# Patient Record
Sex: Female | Born: 1935 | Race: White | Hispanic: No | Smoking: Never smoker
Health system: Southern US, Community
[De-identification: ages and names within clinical notes are randomized; demographics above are authoritative.]

## PROBLEM LIST (undated history)

## (undated) DIAGNOSIS — K625 Hemorrhage of anus and rectum: Secondary | ICD-10-CM

## (undated) DIAGNOSIS — D649 Anemia, unspecified: Secondary | ICD-10-CM

## (undated) DIAGNOSIS — M79669 Pain in unspecified lower leg: Secondary | ICD-10-CM

## (undated) DIAGNOSIS — Z7901 Long term (current) use of anticoagulants: Secondary | ICD-10-CM

## (undated) DIAGNOSIS — R112 Nausea with vomiting, unspecified: Secondary | ICD-10-CM

## (undated) DIAGNOSIS — K219 Gastro-esophageal reflux disease without esophagitis: Secondary | ICD-10-CM

## (undated) DIAGNOSIS — K5790 Diverticulosis of intestine, part unspecified, without perforation or abscess without bleeding: Secondary | ICD-10-CM

## (undated) DIAGNOSIS — R0989 Other specified symptoms and signs involving the circulatory and respiratory systems: Secondary | ICD-10-CM

## (undated) DIAGNOSIS — H269 Unspecified cataract: Secondary | ICD-10-CM

## (undated) DIAGNOSIS — I639 Cerebral infarction, unspecified: Secondary | ICD-10-CM

## (undated) DIAGNOSIS — R569 Unspecified convulsions: Secondary | ICD-10-CM

## (undated) DIAGNOSIS — I10 Essential (primary) hypertension: Secondary | ICD-10-CM

## (undated) DIAGNOSIS — Z8619 Personal history of other infectious and parasitic diseases: Secondary | ICD-10-CM

## (undated) DIAGNOSIS — M1990 Unspecified osteoarthritis, unspecified site: Secondary | ICD-10-CM

## (undated) DIAGNOSIS — I4891 Unspecified atrial fibrillation: Secondary | ICD-10-CM

## (undated) DIAGNOSIS — G4700 Insomnia, unspecified: Secondary | ICD-10-CM

## (undated) DIAGNOSIS — E785 Hyperlipidemia, unspecified: Secondary | ICD-10-CM

## (undated) DIAGNOSIS — Z9889 Other specified postprocedural states: Secondary | ICD-10-CM

## (undated) DIAGNOSIS — E039 Hypothyroidism, unspecified: Secondary | ICD-10-CM

## (undated) HISTORY — DX: Anemia, unspecified: D64.9

## (undated) HISTORY — PX: TOTAL KNEE ARTHROPLASTY: SHX125

## (undated) HISTORY — DX: Pain in unspecified lower leg: M79.669

## (undated) HISTORY — DX: Gastro-esophageal reflux disease without esophagitis: K21.9

## (undated) HISTORY — DX: Essential (primary) hypertension: I10

## (undated) HISTORY — DX: Cerebral infarction, unspecified: I63.9

## (undated) HISTORY — DX: Nausea with vomiting, unspecified: R11.2

## (undated) HISTORY — PX: SHOULDER OPEN ROTATOR CUFF REPAIR: SHX2407

## (undated) HISTORY — DX: Personal history of other infectious and parasitic diseases: Z86.19

## (undated) HISTORY — DX: Diverticulosis of intestine, part unspecified, without perforation or abscess without bleeding: K57.90

## (undated) HISTORY — DX: Unspecified atrial fibrillation: I48.91

## (undated) HISTORY — DX: Unspecified cataract: H26.9

## (undated) HISTORY — DX: Long term (current) use of anticoagulants: Z79.01

## (undated) HISTORY — DX: Hyperlipidemia, unspecified: E78.5

## (undated) HISTORY — DX: Other specified postprocedural states: Z98.890

## (undated) HISTORY — DX: Other specified symptoms and signs involving the circulatory and respiratory systems: R09.89

## (undated) HISTORY — PX: GALLBLADDER SURGERY: SHX652

## (undated) HISTORY — DX: Hypothyroidism, unspecified: E03.9

## (undated) HISTORY — DX: Unspecified convulsions: R56.9

## (undated) HISTORY — DX: Insomnia, unspecified: G47.00

## (undated) HISTORY — PX: COLONOSCOPY: SHX5424

## (undated) HISTORY — DX: Unspecified osteoarthritis, unspecified site: M19.90

---

## 1998-12-21 ENCOUNTER — Inpatient Hospital Stay (HOSPITAL_COMMUNITY): Admission: AD | Admit: 1998-12-21 | Discharge: 1998-12-21 | Payer: Self-pay | Admitting: Cardiology

## 1999-01-05 DIAGNOSIS — K625 Hemorrhage of anus and rectum: Secondary | ICD-10-CM

## 1999-01-05 HISTORY — DX: Hemorrhage of anus and rectum: K62.5

## 1999-01-07 ENCOUNTER — Encounter: Payer: Self-pay | Admitting: Cardiovascular Disease

## 1999-03-14 ENCOUNTER — Inpatient Hospital Stay (HOSPITAL_COMMUNITY): Admission: EM | Admit: 1999-03-14 | Discharge: 1999-03-14 | Payer: Self-pay | Admitting: Emergency Medicine

## 1999-03-14 ENCOUNTER — Encounter: Payer: Self-pay | Admitting: Cardiology

## 1999-07-14 ENCOUNTER — Encounter: Payer: Self-pay | Admitting: Internal Medicine

## 2001-05-18 ENCOUNTER — Inpatient Hospital Stay (HOSPITAL_COMMUNITY): Admission: EM | Admit: 2001-05-18 | Discharge: 2001-05-19 | Payer: Self-pay | Admitting: Emergency Medicine

## 2001-05-18 ENCOUNTER — Encounter: Payer: Self-pay | Admitting: *Deleted

## 2001-08-18 ENCOUNTER — Encounter: Payer: Self-pay | Admitting: Cardiology

## 2001-10-24 ENCOUNTER — Encounter: Payer: Self-pay | Admitting: Family Medicine

## 2001-10-24 ENCOUNTER — Ambulatory Visit (HOSPITAL_COMMUNITY): Admission: RE | Admit: 2001-10-24 | Discharge: 2001-10-24 | Payer: Self-pay | Admitting: Family Medicine

## 2002-02-28 ENCOUNTER — Encounter: Payer: Self-pay | Admitting: Internal Medicine

## 2002-03-01 ENCOUNTER — Inpatient Hospital Stay (HOSPITAL_COMMUNITY): Admission: EM | Admit: 2002-03-01 | Discharge: 2002-03-02 | Payer: Self-pay | Admitting: Emergency Medicine

## 2002-09-30 ENCOUNTER — Inpatient Hospital Stay (HOSPITAL_COMMUNITY): Admission: EM | Admit: 2002-09-30 | Discharge: 2002-10-01 | Payer: Self-pay

## 2002-09-30 ENCOUNTER — Encounter: Payer: Self-pay | Admitting: Emergency Medicine

## 2004-01-07 ENCOUNTER — Ambulatory Visit: Payer: Self-pay | Admitting: Family Medicine

## 2004-02-05 ENCOUNTER — Ambulatory Visit: Payer: Self-pay | Admitting: Family Medicine

## 2004-03-03 ENCOUNTER — Ambulatory Visit: Payer: Self-pay | Admitting: Family Medicine

## 2004-05-05 ENCOUNTER — Ambulatory Visit: Payer: Self-pay | Admitting: Family Medicine

## 2004-05-14 ENCOUNTER — Observation Stay (HOSPITAL_COMMUNITY): Admission: RE | Admit: 2004-05-14 | Discharge: 2004-05-15 | Payer: Self-pay | Admitting: Orthopedic Surgery

## 2004-06-09 ENCOUNTER — Ambulatory Visit: Payer: Self-pay | Admitting: Family Medicine

## 2004-08-17 ENCOUNTER — Ambulatory Visit: Payer: Self-pay | Admitting: Family Medicine

## 2004-10-15 ENCOUNTER — Ambulatory Visit: Payer: Self-pay | Admitting: Family Medicine

## 2004-12-21 ENCOUNTER — Ambulatory Visit: Payer: Self-pay | Admitting: Family Medicine

## 2005-10-14 ENCOUNTER — Ambulatory Visit: Payer: Self-pay | Admitting: Family Medicine

## 2005-12-21 ENCOUNTER — Ambulatory Visit: Payer: Self-pay | Admitting: Family Medicine

## 2006-01-19 ENCOUNTER — Ambulatory Visit: Payer: Self-pay | Admitting: Family Medicine

## 2006-01-31 ENCOUNTER — Ambulatory Visit (HOSPITAL_COMMUNITY): Admission: RE | Admit: 2006-01-31 | Discharge: 2006-01-31 | Payer: Self-pay | Admitting: Family Medicine

## 2006-03-09 ENCOUNTER — Ambulatory Visit: Payer: Self-pay | Admitting: Family Medicine

## 2006-04-13 ENCOUNTER — Emergency Department (HOSPITAL_COMMUNITY): Admission: EM | Admit: 2006-04-13 | Discharge: 2006-04-13 | Payer: Self-pay | Admitting: Emergency Medicine

## 2006-05-19 ENCOUNTER — Ambulatory Visit (HOSPITAL_COMMUNITY): Admission: RE | Admit: 2006-05-19 | Discharge: 2006-05-21 | Payer: Self-pay | Admitting: Orthopedic Surgery

## 2006-06-29 ENCOUNTER — Ambulatory Visit: Payer: Self-pay | Admitting: Family Medicine

## 2006-08-23 ENCOUNTER — Encounter: Admission: RE | Admit: 2006-08-23 | Discharge: 2006-09-29 | Payer: Self-pay | Admitting: Orthopedic Surgery

## 2006-10-26 ENCOUNTER — Ambulatory Visit: Payer: Self-pay | Admitting: Cardiology

## 2006-11-23 ENCOUNTER — Ambulatory Visit: Payer: Self-pay | Admitting: Cardiology

## 2007-11-14 ENCOUNTER — Ambulatory Visit: Payer: Self-pay | Admitting: Cardiology

## 2007-12-11 ENCOUNTER — Ambulatory Visit: Payer: Self-pay | Admitting: Cardiology

## 2007-12-11 ENCOUNTER — Ambulatory Visit: Payer: Self-pay

## 2007-12-13 ENCOUNTER — Ambulatory Visit: Payer: Self-pay | Admitting: Cardiology

## 2008-10-30 ENCOUNTER — Ambulatory Visit (HOSPITAL_COMMUNITY): Admission: RE | Admit: 2008-10-30 | Discharge: 2008-10-30 | Payer: Self-pay | Admitting: Family Medicine

## 2009-02-18 ENCOUNTER — Emergency Department (HOSPITAL_COMMUNITY): Admission: EM | Admit: 2009-02-18 | Discharge: 2009-02-18 | Payer: Self-pay | Admitting: Emergency Medicine

## 2009-03-11 ENCOUNTER — Encounter: Admission: RE | Admit: 2009-03-11 | Discharge: 2009-03-11 | Payer: Self-pay | Admitting: Neurological Surgery

## 2009-06-25 ENCOUNTER — Encounter: Payer: Self-pay | Admitting: Cardiology

## 2009-06-27 ENCOUNTER — Encounter: Payer: Self-pay | Admitting: Cardiology

## 2009-07-02 ENCOUNTER — Emergency Department (HOSPITAL_COMMUNITY): Admission: EM | Admit: 2009-07-02 | Discharge: 2009-07-02 | Payer: Self-pay | Admitting: Emergency Medicine

## 2009-07-08 ENCOUNTER — Telehealth: Payer: Self-pay | Admitting: Cardiology

## 2009-07-11 ENCOUNTER — Encounter: Payer: Self-pay | Admitting: Cardiology

## 2009-07-17 ENCOUNTER — Ambulatory Visit: Payer: Self-pay | Admitting: Cardiology

## 2009-07-31 ENCOUNTER — Ambulatory Visit (HOSPITAL_COMMUNITY): Admission: RE | Admit: 2009-07-31 | Discharge: 2009-08-01 | Payer: Self-pay | Admitting: Orthopedic Surgery

## 2009-09-15 ENCOUNTER — Encounter
Admission: RE | Admit: 2009-09-15 | Discharge: 2009-12-14 | Payer: Self-pay | Source: Home / Self Care | Attending: Orthopedic Surgery | Admitting: Orthopedic Surgery

## 2009-09-30 ENCOUNTER — Emergency Department (HOSPITAL_COMMUNITY): Admission: EM | Admit: 2009-09-30 | Discharge: 2009-09-30 | Payer: Self-pay | Admitting: Emergency Medicine

## 2010-01-04 HISTORY — PX: TOTAL SHOULDER REPLACEMENT: SUR1217

## 2010-01-06 ENCOUNTER — Encounter: Payer: Self-pay | Admitting: Internal Medicine

## 2010-01-11 ENCOUNTER — Emergency Department (HOSPITAL_COMMUNITY)
Admission: EM | Admit: 2010-01-11 | Discharge: 2010-01-11 | Payer: Self-pay | Source: Home / Self Care | Admitting: Emergency Medicine

## 2010-01-12 ENCOUNTER — Telehealth: Payer: Self-pay | Admitting: Cardiology

## 2010-01-16 ENCOUNTER — Telehealth: Payer: Self-pay | Admitting: Internal Medicine

## 2010-01-19 ENCOUNTER — Ambulatory Visit
Admission: RE | Admit: 2010-01-19 | Discharge: 2010-01-19 | Payer: Self-pay | Source: Home / Self Care | Attending: Gastroenterology | Admitting: Gastroenterology

## 2010-01-19 ENCOUNTER — Encounter: Payer: Self-pay | Admitting: Cardiology

## 2010-01-19 ENCOUNTER — Encounter: Payer: Self-pay | Admitting: Internal Medicine

## 2010-01-19 DIAGNOSIS — R1032 Left lower quadrant pain: Secondary | ICD-10-CM | POA: Insufficient documentation

## 2010-01-19 DIAGNOSIS — R1013 Epigastric pain: Secondary | ICD-10-CM | POA: Insufficient documentation

## 2010-01-19 DIAGNOSIS — D649 Anemia, unspecified: Secondary | ICD-10-CM | POA: Insufficient documentation

## 2010-01-19 DIAGNOSIS — R1319 Other dysphagia: Secondary | ICD-10-CM | POA: Insufficient documentation

## 2010-01-19 DIAGNOSIS — K5730 Diverticulosis of large intestine without perforation or abscess without bleeding: Secondary | ICD-10-CM | POA: Insufficient documentation

## 2010-01-19 DIAGNOSIS — R1031 Right lower quadrant pain: Secondary | ICD-10-CM | POA: Insufficient documentation

## 2010-01-19 DIAGNOSIS — K3189 Other diseases of stomach and duodenum: Secondary | ICD-10-CM | POA: Insufficient documentation

## 2010-01-19 LAB — HEPATIC FUNCTION PANEL
ALT: 12 U/L (ref 0–35)
AST: 19 U/L (ref 0–37)
Albumin: 3.5 g/dL (ref 3.5–5.2)
Alkaline Phosphatase: 49 U/L (ref 39–117)
Bilirubin, Direct: 0.1 mg/dL (ref 0.0–0.3)
Indirect Bilirubin: 0.3 mg/dL (ref 0.3–0.9)
Total Bilirubin: 0.4 mg/dL (ref 0.3–1.2)
Total Protein: 6.1 g/dL (ref 6.0–8.3)

## 2010-01-19 LAB — DIFFERENTIAL
Basophils Absolute: 0 10*3/uL (ref 0.0–0.1)
Basophils Relative: 1 % (ref 0–1)
Eosinophils Absolute: 0.1 K/uL (ref 0.0–0.7)
Eosinophils Relative: 2 % (ref 0–5)
Lymphocytes Relative: 30 % (ref 12–46)
Lymphs Abs: 1.5 10*3/uL (ref 0.7–4.0)
Monocytes Absolute: 0.5 K/uL (ref 0.1–1.0)
Monocytes Relative: 10 % (ref 3–12)
Neutro Abs: 2.8 10*3/uL (ref 1.7–7.7)
Neutrophils Relative %: 58 % (ref 43–77)

## 2010-01-19 LAB — PROTIME-INR
INR: 2.47 — ABNORMAL HIGH (ref 0.00–1.49)
Prothrombin Time: 26.9 seconds — ABNORMAL HIGH (ref 11.6–15.2)

## 2010-01-19 LAB — APTT: aPTT: 38 seconds — ABNORMAL HIGH (ref 24–37)

## 2010-01-19 LAB — CBC
HCT: 31.1 % — ABNORMAL LOW (ref 36.0–46.0)
Hemoglobin: 9.9 g/dL — ABNORMAL LOW (ref 12.0–15.0)
MCH: 26.3 pg (ref 26.0–34.0)
MCHC: 31.8 g/dL (ref 30.0–36.0)
MCV: 82.7 fL (ref 78.0–100.0)
Platelets: 289 10*3/uL (ref 150–400)
RBC: 3.76 MIL/uL — ABNORMAL LOW (ref 3.87–5.11)
RDW: 17.1 % — ABNORMAL HIGH (ref 11.5–15.5)
WBC: 4.9 10*3/uL (ref 4.0–10.5)

## 2010-01-19 LAB — POCT CARDIAC MARKERS
CKMB, poc: <1 ng/mL — ABNORMAL LOW (ref 1.0–8.0)
CKMB, poc: <1 ng/mL — ABNORMAL LOW (ref 1.0–8.0)
Myoglobin, poc: 61.5 ng/mL (ref 12–200)
Myoglobin, poc: 55.8 ng/mL (ref 12–200)
Troponin i, poc: <0.05 ng/mL (ref 0.00–0.09)
Troponin i, poc: <0.05 ng/mL (ref 0.00–0.09)

## 2010-01-19 LAB — BASIC METABOLIC PANEL
BUN: 11 mg/dL (ref 6–23)
CO2: 30 mEq/L (ref 19–32)
Calcium: 8.5 mg/dL (ref 8.4–10.5)
Chloride: 105 mEq/L (ref 96–112)
Creatinine, Ser: 0.75 mg/dL (ref 0.4–1.2)
GFR calc Af Amer: >60 mL/min (ref >60)
GFR calc non Af Amer: >60 mL/min (ref >60)
Glucose, Bld: 103 mg/dL — ABNORMAL HIGH (ref 70–99)
Potassium: 3.7 mEq/L (ref 3.5–5.1)
Sodium: 142 mEq/L (ref 135–145)

## 2010-01-19 LAB — LIPASE, BLOOD: Lipase: 24 U/L (ref 11–59)

## 2010-01-25 ENCOUNTER — Emergency Department (HOSPITAL_COMMUNITY)
Admission: EM | Admit: 2010-01-25 | Discharge: 2010-01-25 | Payer: Self-pay | Source: Home / Self Care | Admitting: Emergency Medicine

## 2010-01-30 ENCOUNTER — Encounter: Payer: Self-pay | Admitting: Internal Medicine

## 2010-02-05 NOTE — Letter (Addendum)
Summary: Anticoag  Anticoag   Imported By: Lester Bethel 01/26/2010 07:13:31  _____________________________________________________________________  External Attachment:    Type:   Image     Comment:   External Document

## 2010-02-05 NOTE — Assessment & Plan Note (Signed)
Summary: surg clearance for shoulder surgery  Medications Added FLECTOR 1.3 % PTCH (DICLOFENAC EPOLAMINE) as directed VITAMIN D (ERGOCALCIFEROL) 50000 UNIT CAPS (ERGOCALCIFEROL) 1 by mouth WEEKLY COUMADIN 5 MG TABS (WARFARIN SODIUM) as directed TAMBOCOR 100 MG TABS (FLECAINIDE ACETATE) 2 by mouth daily AMBIEN 10 MG TABS (ZOLPIDEM TARTRATE) 1 by mouth at bedtime COLACE 100 MG CAPS (DOCUSATE SODIUM) 1 by mouth two times a day OXYCODONE HCL 5 MG TABS (OXYCODONE HCL) as needed ATENOLOL 50 MG TABS (ATENOLOL) 1 by mouth daily FUROSEMIDE 20 MG TABS (FUROSEMIDE) 1po daily LEVOTHROID 50 MCG TABS (LEVOTHYROXINE SODIUM) 1 by mouth daily CELEBREX 200 MG CAPS (CELECOXIB) as needed MECLIZINE HCL 25 MG TABS (MECLIZINE HCL) 1 by mouth as needed      Allergies Added: ! PENICILLIN  Visit Type:   surgical clearance Referring Provider:  Francena Hanly, MD Primary Provider:  Ardeen Garland, MD  CC:  atrial fibrillation.  History of Present Illness: The patient was seen for surgical clearance for shoulder surgery.  She has a history of paroxysmal atrial fibrillation.  This has been nicely controlled on medications.  She does take Coumadin.  She has never had a neurologic event.  She's not having any chest pain or shortness of breath.  Her activity level is limited by knee problems and her shoulder problems.  There is no history of coronary artery disease.  Current Medications (verified): 1)  Flector 1.3 % Ptch (Diclofenac Epolamine) .... As Directed 2)  Vitamin D (Ergocalciferol) 50000 Unit Caps (Ergocalciferol) .Marland Kitchen.. 1 By Mouth Weekly 3)  Coumadin 5 Mg Tabs (Warfarin Sodium) .... As Directed 4)  Tambocor 100 Mg Tabs (Flecainide Acetate) .... 2 By Mouth Daily 5)  Ambien 10 Mg Tabs (Zolpidem Tartrate) .Marland Kitchen.. 1 By Mouth At Bedtime 6)  Colace 100 Mg Caps (Docusate Sodium) .Marland Kitchen.. 1 By Mouth Two Times A Day 7)  Oxycodone Hcl 5 Mg Tabs (Oxycodone Hcl) .... As Needed 8)  Atenolol 50 Mg Tabs (Atenolol) .Marland Kitchen.. 1 By Mouth  Daily 9)  Furosemide 20 Mg Tabs (Furosemide) .Marland Kitchen.. 1po Daily 10)  Levothroid 50 Mcg Tabs (Levothyroxine Sodium) .Marland Kitchen.. 1 By Mouth Daily 11)  Celebrex 200 Mg Caps (Celecoxib) .... As Needed 12)  Meclizine Hcl 25 Mg Tabs (Meclizine Hcl) .Marland Kitchen.. 1 By Mouth As Needed  Allergies (verified): 1)  ! Penicillin  Past History:  Family History: Last updated: 12/13/2007 family history coronary artery disease  Social History: Last updated: 12/13/2007 nonsmoker  Past Medical History: paroxysmal atrial fibrillation. Treated with flecainide. Dose increase November 2009 Coumadin therapy Family history coronary disease Hypothyroidism Right carotid bruit. Carotid Doppler normal December 2009 EF  normal...echo...2003 Nuclear stress...Marland Kitchennormal....2001 Carotid doppler...normal....2009 Need for total knee replacement   July, 2011 Need for left shoulder surgery.... to be done before knee surgery.... July, 2011  Review of Systems       Patient denies fever, chills, headache, sweats, rash, change in vision, change in hearing, chest pain, cough, nausea vomiting, urinary symptoms.  All other systems are reviewed and are negative  Vital Signs:  Patient profile:   77 year old female Height:      63 inches Weight:      165 pounds BMI:     29.33 Pulse rate:   72 / minute Resp:     16 per minute BP sitting:   132 / 76  (left arm)  Vitals Entered By: Marrion Coy, CNA (July 17, 2009 3:08 PM)  Physical Exam  General:  The patient is stable overweight. Head:  head is atraumatic. Eyes:  no xanthelasma. Neck:  no jugular venous distention. Chest Wall:  no chest wall tenderness. Lungs:  lungs are clear.  Respiratory effort is nonlabored. Heart:  cardiac exam reveals S1-S2.  No clicks or significant murmurs. Abdomen:  abdomen is soft. Msk:  no musculoskeletal deformities. Extremities:  no peripheral edema. Skin:  no skin rashes. Psych:  patient is oriented to person time and place.  Affect is  normal.   Impression & Recommendations:  Problem # 1:  ATRIAL FIBRILLATION, PAROXYSMAL (ICD-427.31)  There is a history of atrial fibrillation.  EKG is done today and reviewed by me.  There is normal sinus rhythm.  She is on Coumadin.  It will be safe to hold her Coumadin for 5 days before surgery and to restart it after the surgery.  She does not need any type of bridging with Lovenox from the cardiovascular viewpoint.  Orders: EKG w/ Interpretation (93000)  Problem # 2:  HYPOTHYROIDISM (ICD-244.9) Thyroid is treated.  Problem # 3:  COUMADIN THERAPY (ICD-V58.61) As noted the patient's Coumadin can be held for 5 days before surgery.  There is no bridging needed.Coumadin can be restarted after her surgery.  Problem # 4:  * SURGICAL CLEARANCE FOR SHOULDER SURGERY EKG is done today and reviewed by me.  There is decreased anterior R wave progression.  This does not present a significant change.  This is not a significant abnormality.  The patient's overall cardiac status stable.  No further cardiac workup is needed.  She is cleared for her shoulder surgery.  Patient Instructions: 1)  You have been cleared for surgery, a note has been faxed to  2)  Dr Rennis Chris. 3)  Your physician wants you to follow-up in:  1 year.  You will receive a reminder letter in the mail two months in advance. If you don't receive a letter, please call our office to schedule the follow-up appointment.

## 2010-02-05 NOTE — Letter (Signed)
Summary: [facility] Office Visit Note   [facility] Office Visit Note   Imported By: Roderic Ovens 07/23/2009 14:23:51  _____________________________________________________________________  External Attachment:    Type:   Image     Comment:   External Document

## 2010-02-05 NOTE — Letter (Signed)
Summary: [facility] Office Visit Note   [facility] Office Visit Note   Imported By: Roderic Ovens 07/23/2009 14:22:09  _____________________________________________________________________  External Attachment:    Type:   Image     Comment:   External Document

## 2010-02-05 NOTE — Progress Notes (Signed)
Summary: Refused appt        Additional Follow-up for Phone Call Additional follow up Details #2::    01/13/10 9:19 am pt line busy. will try and get pt in to see Mack today. Claris Gladden, RN, BSN  01/12/10--1350pm--per flag from dr Claudine Stallings, ms Shultis was hospitalized and now needs f/u sometime this week with dr Myrtis Ser or scott weaver --dr Myrtis Ser is off this week but scott weaver available--attempted to call pt-no answer-was able to leave messo fr her to phone [place] for appoint. will forward to triage desk top so we can sched pt when she phones [place] back--nt  01/13/10 -- 9:38am RN s/w Pt re: appt. States she s/w someone else this morning and was offfered an appt for Friday, Jan 16, 2010 and refused appt. Reports she feels better and does not need an appt. Bernita Raisin, RN, BSN  January 13, 2010 9:40 AM   Appended Document: Refused appt OK

## 2010-02-05 NOTE — Progress Notes (Signed)
Summary: needs cardiac clearence   Phone Note Call from Patient   Caller: Patient Reason for Call: Talk to Nurse Summary of Call: pt needs rotater cuff surgery and needs cardiac clearence-does she need to be seen? pls call [phone] Initial call taken by: Glynda Jaeger,  July 08, 2009 1:57 PM  Follow-up for Phone Call        pt does need to be seen, appt sch w/Dr Myrtis Ser for 7/14 at 3:15 [facility], RN  July 09, 2009 9:08 AM

## 2010-02-05 NOTE — Progress Notes (Signed)
Summary: sch appt   Phone Note From Other Clinic Call back at [phone]   Caller: Tammy from Dr Marisa Sprinkles office Call For: Dr Juanda Chance Reason for Call: Schedule Patient Appt Summary of Call: Dr Lysbeth Galas would like this patient seen for diverticulities flare up but theres no available appt with Dr Juanda Chance. Initial call taken by: Tawni Levy,  January 16, 2010 10:53 AM  Follow-up for Phone Call        Spoke with Tammy at Dr. Joyce Copa office. Scheduled patient with Mike Gip, PA on 01/19/10 at 2 PM. Tammy will fax patient's records to [place]. Follow-up by: Jesse Fall RN,  January 16, 2010 11:15 AM

## 2010-02-05 NOTE — Miscellaneous (Signed)
  Clinical Lists Changes  Problems: Added new problem of ATRIAL FIBRILLATION, PAROXYSMAL (ICD-427.31) Added new problem of COUMADIN THERAPY (ICD-V58.61) Added new problem of HYPOTHYROIDISM (ICD-244.9) Observations: Added new observation of PAST MED HX: paroxysmal atrial fibrillation. Treated with flecainide. Dose increase November 2009 Coumadin therapy Family history coronary disease Hypothyroidism Right carotid bruit. Carotid Doppler normal December 2009 EF  normal...echo...2003 Nuclear stress...Marland Kitchennormal....2001 Carotid doppler...normal....2009 (06/25/2009 12:49)       Past History:  Past Medical History: paroxysmal atrial fibrillation. Treated with flecainide. Dose increase November 2009 Coumadin therapy Family history coronary disease Hypothyroidism Right carotid bruit. Carotid Doppler normal December 2009 EF  normal...echo...2003 Nuclear stress...Marland Kitchennormal....2001 Carotid doppler...normal....2009

## 2010-02-05 NOTE — Assessment & Plan Note (Signed)
Summary: diverticulitis flare/[name]    History of Present Illness Visit Type: Initial Consult Primary GI MD: Lina Sar MD Primary Provider: Joette Catching, MD Requesting Provider: Joette Catching, MD Chief Complaint: Patient states that she was on alot of antibiotics for a kidney infection. She states that she was having some RLQ pain which is some better now she is having some rectal pain with standing. She states that she is having some rectal bleeding with her hemorrhoids. She had a CT that showed diverticulosis. History of Present Illness:   PLEASANT [name] KNOWN REMOTELY TO DR. Juanda Chance. SHE HAD A COLONOSCOPY IN 7/01  WHICH SHOWED DIFFUSE DIVERTICULOSIS, AND HEMORRHOIDS.  SHE DOES HAVE A BROTHER WHO HAS HX OF COLON CANCER. SHE IS REFERRED TODAY DUE TO CT FINDINGS AND RECENT C/O LOWER ABDOMINAL PAIN-PRIMARILY RIGHT SIDED.  SHE SAYS SHE HAD A UTI AND HAS TAKEN 2 SEPARATE COURSES OF ANTIBIOTICS. HER URINARY SXS ARE RESOLVED BUT SHE HAS HAD A LOT OF INDIGESTION ETC SINCE. THIS HAS BEEN ASSOCIATED WITH BLOATING, SOME PAIN IN BOTH SHOULDERS. SHE DENIES ODYNOPHAGIA,HAS HAD ALOT OF BELCHING, AND GAS. SHE HAS ALSO NOTED SOME SOLID FOOD DYSPHAGIA RECENTLY. BM'S HAVE BEEN NORMAL,OCCASIONALLY SEES BRB ON THE TISSUE. SHE HAD SOME DARK STOOLS FOR A FEW DAYS 2 WEEKS AGO. SHE C/O DISCOMFORT ACROSS HER LOWER ABDOMEN.  PT IS ON COUMADIN FOR ATRIAL FIBRILLATION. SHE HAS ALSO BEEN ON CELEBREX LONG TERM WHICH SHE HAD BEEN TAKING ON AN EMPTY STOMACH, AS WELL AS A BABY ASA. LABS ON 01/07/10 WBC 5.1,HGB10.7,MCV 84,PT21.6/INR 1.86.  CT ABD/PELVIS ON 01/14/10 SHOWS DIFFUSE DIVERTICULOSIS,NO  ACTIVE INFLAMMATION.   GI Review of Systems    Reports abdominal pain.     Location of  Abdominal pain: RLQ.    Denies acid reflux, belching, bloating, chest pain, dysphagia with liquids, dysphagia with solids, heartburn, loss of appetite, nausea, vomiting, vomiting blood, weight loss, and  weight gain.      Reports  diverticulosis, hemorrhoids, and  rectal bleeding.     Denies anal fissure, black tarry stools, change in bowel habit, constipation, diarrhea, fecal incontinence, heme positive stool, irritable bowel syndrome, jaundice, light color stool, liver problems, and  rectal pain. Preventive Screening-Counseling & Management      Drug Use:  no.      Current Medications (verified): 1)  Vitamin D (Ergocalciferol) 50000 Unit Caps (Ergocalciferol) .Marland Kitchen.. 1 By Mouth Weekly 2)  Coumadin 5 Mg Tabs (Warfarin Sodium) .... As Directed 3)  Tambocor 100 Mg Tabs (Flecainide Acetate) .... Take One By Mouth Two Times A Day 4)  Ambien 10 Mg Tabs (Zolpidem Tartrate) .Marland Kitchen.. 1 By Mouth At Bedtime 5)  Atenolol 50 Mg Tabs (Atenolol) .Marland Kitchen.. 1 By Mouth Daily 6)  Furosemide 20 Mg Tabs (Furosemide) .Marland Kitchen.. 1po Daily 7)  Levothroid 50 Mcg Tabs (Levothyroxine Sodium) .Marland Kitchen.. 1 By Mouth Daily 8)  Celebrex 200 Mg Caps (Celecoxib) .... As Needed 9)  Meclizine Hcl 25 Mg Tabs (Meclizine Hcl) .Marland Kitchen.. 1 By Mouth As Needed 10)  Aspirin 81 Mg Tbec (Aspirin) .... Take One By Mouth Once Daily 11)  Flonase 50 Mcg/act Susp (Fluticasone Propionate) .... Use As Needed 12)  Hydrocodone-Acetaminophen 5-500 Mg Tabs (Hydrocodone-Acetaminophen) .... Take One By Mouth As Needed  Allergies (verified): 1)  ! Penicillin  Past History:  Past Medical History: paroxysmal atrial fibrillation. Treated with flecainide. Dose increase November 2009 Coumadin therapy Family history coronary disease Hypothyroidism Right carotid bruit. Carotid Doppler normal December 2009 EF  normal...echo...2003 Nuclear stress...Marland Kitchennormal....2001  Carotid doppler...normal....2009 Diverticulosis Hyperlipidemia Hypertension OSTEOARTHRITIS  Past Surgical History: left shoulder surgery  Family History: family history coronary artery disease: 6 brother Family History of Liver Cancer: youngest brother dxed age 22 Family History of Colon Cancer: youngest brother mets to  liver  Social History: nonsmoker Alcohol Use - no Illicit Drug Use - no Daily Caffeine Use coffee 1/2 and 1/2 one cup Drug Use:  no  Review of Systems       The patient complains of allergy/sinus, anxiety-new, arthritis/joint pain, blood in urine, cough, fatigue, hearing problems, muscle pains/cramps, sleeping problems, swelling of feet/legs, and urine leakage.  The patient denies anemia, back pain, breast changes/lumps, change in vision, confusion, coughing up blood, depression-new, fainting, fever, headaches-new, heart murmur, heart rhythm changes, itching, menstrual pain, night sweats, nosebleeds, pregnancy symptoms, shortness of breath, skin rash, sore throat, swollen lymph glands, thirst - excessive, urination - excessive, urination changes/pain, vision changes, and voice change.         SEE HPI  Vital Signs:  Patient profile:   Terri year old Rogers Height:      63 inches Weight:      158.4 pounds Pulse rate:   74 / minute Pulse rhythm:   regular BP sitting:   134 / 74  (left arm) Cuff size:   regular  Vitals Entered By: Harlow Mares CMA Duncan Dull) (January 19, 2010 2:07 PM)  Physical Exam  General:  Well developed, well nourished, no acute distress.,ELDERLY Head:  Normocephalic and atraumatic. Eyes:  PERRLA, no icterus. Lungs:  Clear throughout to auscultation. Heart:  Regular rate and rhythm; no murmurs, rubs,  or bruits. Abdomen:  SOFT, MILD TENDERNESS LOWER ABDOMEN AND RLQ/LLQ. NO PALPABLE MASS OR HSM,BS+ Rectal:  BROWN HEME NEGATIVE STOOL Extremities:  No clubbing, cyanosis, edema or deformities noted. Neurologic:  Alert and  oriented x4;  grossly normal neurologically. Psych:  Alert and cooperative. Normal mood and affect.   Impression & Recommendations:  Problem # 1:  ABDOMINAL PAIN RIGHT LOWER QUADRANT (ICD-789.03) Assessment Improved [name] WITH RECENT LOWER ABDOMINAL DISCOMFORT, ABNORMAL CT SHOWING DIFFUSE DIVERTICULOSIS BUT NO DIVERTICULITIS.  UTI  RESOLVED.  PT WITH FAMILY HX OF COLON CANCER.  SHE IS DUE FOR COLONOSCOPY FOR SCREENING AND WILL HELP DX HER CURRENT C/O.  SCHEDULE FOR COLONOSCOPY WITH DR. BRODIE-DISCUSSE IN DETAIL WITH PT WE WILL OBTAIN  CLEARANCE PER DR. KATZ FOR HER TO COME OFF COUMADIN PRE PROCEDURE   Orders:  Problem # 2:  COLORECTAL CANCER, FAMILY HX (ICD-V16.0) Assessment: Comment Only BROTHER-  OVER DUE FOR FOLLOW UP   SEE ABOVE Orders:  Problem # 3:  DYSPEPSIA (ICD-536.8) Assessment: New DYSPEPSIA POST ABX- GASTRITIS,ESOPHAGITIS   TRIAL OF OTC PRILOSEC 20 MG DAILY IN AM SCHEDULE FOR UPPER ENDOSCOPY WITH DR. BRODIE/POSSIBLE DILATION. Orders:  Problem # 4:  COUMADIN THERAPY (ICD-V58.61) Assessment: Comment Only  Orders:  Problem # 5:  ATRIAL FIBRILLATION, PAROXYSMAL (ICD-427.31) Assessment: Comment Only  Problem # 6:  UNSPECIFIED ANEMIA (ICD-285.9) Assessment: Comment Only HEME NEGATIVE TODAY  Other Orders: Hospital Capsule Endoscopy [facility])  Patient Instructions: 1)  We scheduled the Endoscopy/Colonoscopy with Dr. Juanda Chance at [facility] on 02-09-2010. 2)  Directions and brochure provided. 3)  We faxed  the perscription for the colonoscopy prep to [facility]. 4)  We will be calling you once we have heard from Dr Alveria Apley regarding your Coumadin medication.  He may want you to hold it for approximately 5 days. 5)  We have given you  information on Over the Counter Prilosec you can get at your pharmacy or [facility].  Take 1 capsule 30 min prior to breakfast. 6)  Copy sent to : Dr. Joette Catching 7)  The medication list was reviewed and reconciled.  All changed / newly prescribed medications were explained.  A complete medication list was provided to the patient / caregiver. Prescriptions: MOVIPREP 100 GM  SOLR (PEG-KCL-NACL-NASULF-NA ASC-C) As per prep instructions.  #1 x 0   Entered by:   Lowry Ram NCMA   Authorized by:   Sammuel Cooper PA-c   Signed by:   Lowry Ram NCMA  on 01/19/2010   Method used:   Faxed to ...       Hospital doctor (retail)       [address]       [place], [place]  [address]       Ph: [phone] or [phone]       Fax: [phone]   RxID:   [id number]   Appended Document: diverticulitis flare/[name] I called the pt to advise Dr Myrtis Ser faxed me back the Anticoagultion letter regarading the coumadin . He wants her to be off 7 days.  I told her to stop the Coumadin on 02-03-2010 and resume it again on 02-10-2010. I reminded her that she is having her procedures at [facility].

## 2010-02-05 NOTE — Letter (Signed)
Summary: Med Orders for GI Procedure  Med Orders for GI Procedure   Imported By: Marylou Mccoy 01/29/2010 15:45:55  _____________________________________________________________________  External Attachment:    Type:   Image     Comment:   External Document

## 2010-02-05 NOTE — Letter (Signed)
Summary: [facility] Instructions  [facility]  [address] [place], [place] [address]   Phone: [phone]  Fax: [phone]       [name]    1932/12/27    MRN: [id number]        Procedure Day /Date: 02-09-2010     Arrival Time: 7:30 AM      Procedure Time: 8:30 Am     Location of Procedure:                     X      [facility] ( Outpatient Registration)   PREPARATION FOR COLONOSCOPY WITH MOVIPREP   Starting 5 days prior to your procedure 02-04-2010 do not eat nuts, seeds, popcorn, corn, beans, peas,  salads, or any raw vegetables.  Do not take any fiber supplements (e.g. Metamucil, Citrucel, and Benefiber).  THE DAY BEFORE YOUR PROCEDURE         DATE: 02-08-2010   DAY: Sunday  1.  Drink clear liquids the entire day-NO SOLID FOOD  2.  Do not drink anything colored red or purple.  Avoid juices with pulp.  No orange juice.  3.  Drink at least 64 oz. (8 glasses) of fluid/clear liquids during the day to prevent dehydration and help the prep work efficiently.  CLEAR LIQUIDS INCLUDE: Water Jello Ice Popsicles Tea (sugar ok, no milk/cream) Powdered fruit flavored drinks Coffee (sugar ok, no milk/cream) Gatorade Juice: apple, white grape, white cranberry  Lemonade Clear bullion, consomm, broth Carbonated beverages (any kind) Strained chicken noodle soup Hard Candy                             4.  In the morning, mix first dose of MoviPrep solution:    Empty 1 Pouch A and 1 Pouch B into the disposable container    Add lukewarm drinking water to the top line of the container. Mix to dissolve    Refrigerate (mixed solution should be used within 24 hrs)  5.  Begin drinking the prep at 5:00 p.m. The MoviPrep container is divided by 4 marks.   Every 15 minutes drink the solution down to the next mark (approximately 8 oz) until the full liter is complete.   6.  Follow completed prep with 16 oz of clear liquid of your choice (Nothing red or  purple).  Continue to drink clear liquids until bedtime.  7.  Before going to bed, mix second dose of MoviPrep solution:    Empty 1 Pouch A and 1 Pouch B into the disposable container    Add lukewarm drinking water to the top line of the container. Mix to dissolve    Refrigerate  THE DAY OF YOUR PROCEDURE      DATE: 02-09-2010  DAY: Monday  Beginning at 3:30 AM  (5 hours before procedure):         1. Every 15 minutes, drink the solution down to the next mark (approx 8 oz) until the full liter is complete.  2. Follow completed prep with 16 oz. of clear liquid of your choice.    3. You may drink clear liquids until 6:30 Am (2 HOURS BEFORE PROCEDURE).   MEDICATION INSTRUCTIONS  Unless otherwise instructed, you should take regular prescription medications with a small sip of water   as early as possible the morning of your procedure.  We will notify you once we hear from Dr. Myrtis Ser  regarding your Coumadin.          OTHER INSTRUCTIONS  You will need a responsible adult at least 75 years of age to accompany you and drive you home.   This person must remain in the waiting room during your procedure.  Wear loose fitting clothing that is easily removed.  Leave jewelry and other valuables at home.  However, you may wish to bring a book to read or  an iPod/MP3 player to listen to music as you wait for your procedure to start.  Remove all body piercing jewelry and leave at home.  Total time from sign-in until discharge is approximately 2-3 hours.  You should go home directly after your procedure and rest.  You can resume normal activities the  day after your procedure.  The day of your procedure you should not:   Drive   Make legal decisions   Operate machinery   Drink alcohol   Return to work  You will receive specific instructions about eating, activities and medications before you leave.    The above instructions have been reviewed and explained to me by    _______________________    I fully understand and can verbalize these instructions _____________________________ Date _________

## 2010-02-05 NOTE — Miscellaneous (Addendum)
Summary: [facility] EGD/COLON  Clinical Lists Changes  Orders: Added new Test order of Colon/[facility] (Col/End Hosp) - Signed 

## 2010-02-09 ENCOUNTER — Other Ambulatory Visit: Payer: Self-pay | Admitting: Internal Medicine

## 2010-02-09 ENCOUNTER — Encounter: Payer: MEDICARE | Admitting: Internal Medicine

## 2010-02-09 ENCOUNTER — Ambulatory Visit (HOSPITAL_COMMUNITY)
Admission: RE | Admit: 2010-02-09 | Discharge: 2010-02-09 | Disposition: A | Discharge status: Home / Self Care | Payer: MEDICARE | Source: Ambulatory Visit | Attending: Internal Medicine | Admitting: Internal Medicine

## 2010-02-09 DIAGNOSIS — D649 Anemia, unspecified: Secondary | ICD-10-CM

## 2010-02-09 DIAGNOSIS — R1013 Epigastric pain: Secondary | ICD-10-CM

## 2010-02-09 DIAGNOSIS — K648 Other hemorrhoids: Secondary | ICD-10-CM | POA: Insufficient documentation

## 2010-02-09 DIAGNOSIS — K3189 Other diseases of stomach and duodenum: Secondary | ICD-10-CM

## 2010-02-09 DIAGNOSIS — K921 Melena: Secondary | ICD-10-CM | POA: Insufficient documentation

## 2010-02-09 DIAGNOSIS — D126 Benign neoplasm of colon, unspecified: Secondary | ICD-10-CM

## 2010-02-09 DIAGNOSIS — R109 Unspecified abdominal pain: Secondary | ICD-10-CM | POA: Insufficient documentation

## 2010-02-09 DIAGNOSIS — K5730 Diverticulosis of large intestine without perforation or abscess without bleeding: Secondary | ICD-10-CM | POA: Insufficient documentation

## 2010-02-10 ENCOUNTER — Encounter: Payer: Self-pay | Admitting: Internal Medicine

## 2010-02-11 ENCOUNTER — Encounter (INDEPENDENT_AMBULATORY_CARE_PROVIDER_SITE_OTHER): Payer: Self-pay | Admitting: *Deleted

## 2010-02-11 NOTE — Procedures (Signed)
Summary: Colonoscopy Report / [facility]  Colonoscopy Report / [facility]   Imported By: Lennie Odor 02/02/2010 11:38:22  _____________________________________________________________________  External Attachment:    Type:   Image     Comment:   External Document

## 2010-02-19 NOTE — Procedures (Signed)
Summary: EGD   EGD  Procedure date:  02/09/2010  Findings:      Location: [facility]   ENDOSCOPY PROCEDURE REPORT  PATIENT:  Terri, Rogers  MR#:  [id number] BIRTHDATE:   Jun 20, 1932, 77 yrs. old   GENDER:   female  ENDOSCOPIST:   Hedwig Morton. Juanda Chance, MD Referred by: Joette Catching, M.D.  PROCEDURE DATE:  02/09/2010 PROCEDURE:  EGD with biopsy, 43239 ASA CLASS:   Class II INDICATIONS: dyspepsia, anemia   MEDICATIONS:    Versed 5 mg, Fentanyl 50 mcg TOPICAL ANESTHETIC:   Cetacaine Spray  DESCRIPTION OF PROCEDURE:   After the risks benefits and alternatives of the procedure were thoroughly explained, informed consent was obtained.  The  endoscope was introduced through the mouth and advanced to the second portion of the duodenum, without limitations.  The instrument was slowly withdrawn as the mucosa was fully examined. <<PROCEDUREIMAGES>>        <<OLD IMAGES>>  The upper, middle, and distal third of the esophagus were carefully inspected and no abnormalities were noted. The z-line was well seen at the GEJ. The endoscope was pushed into the fundus which was normal including a retroflexed view. The antrum,gastric body, first and second part of the duodenum were unremarkable.  With standard forceps, a biopsy was obtained and sent to pathology (see image1, image2, image3, and image4). duodenal biopsies to r/o sprue    Retroflexed views revealed no abnormalities.    The scope was then withdrawn from the patient and the procedure completed.  COMPLICATIONS:   None  ENDOSCOPIC IMPRESSION:  1) Normal EGD  s/p small bowl biopsies RECOMMENDATIONS:  1) Await biopsy results  dyspepsia resolved after antibiotics completed  REPEAT EXAM:   In 0 year(s) for.   _______________________________ Hedwig Morton. Juanda Chance, MD    CC:

## 2010-02-19 NOTE — Letter (Signed)
Summary: Patient [name] Biopsy Results  [facility]  [address] [place], [place] [address]   Phone: [phone]  Fax: [phone]        February 10, 2010 MRN: [id number]    [name] [address] [place], [place]  [address]    Dear Ms. Marcello,  I am pleased to inform you that the biopsies taken during your recent endoscopic examination did not show any evidence of cancer upon pathologic examination.  Additional information/recommendations:  __No further action is needed at this time.  Please follow-up with      your primary care physician for your other healthcare needs.  __ Please call [phone] to schedule a return visit to review      your condition.  _x_ Continue with the treatment plan as outlined on the day of your      exam.     Please call us if you are having persistent problems or have questions about your condition that have not been fully answered at this time.  Sincerely,  Hart Carwin MD  This letter has been electronically signed by your physician.  Appended Document: Patient Notice-Endo Biopsy Results Letter mailed to patient.

## 2010-02-19 NOTE — Procedures (Signed)
Summary: Colonoscopy   Colonoscopy  Procedure date:  02/09/2010  Findings:      Location:  [facility].    COLONOSCOPY PROCEDURE REPORT  PATIENT:  Terri, Rogers  MR#:  [id number] BIRTHDATE:   07-18-1932, 77 yrs. old   GENDER:   female ENDOSCOPIST:   Hedwig Morton. Juanda Chance, MD REF. BY: Joette Catching, M.D. PROCEDURE DATE:  02/09/2010 PROCEDURE:  Colonoscopy 40981 ASA CLASS:   Class II INDICATIONS: Abdominal pain, Anemia, hematochezia last colon 2001 showed diverticulosis; Hgb 10.4 MEDICATIONS:    There was residual sedation effect present from prior procedure. 1 mg, Epinephrine 50 mcg  DESCRIPTION OF PROCEDURE:   After the risks benefits and alternatives of the procedure were thoroughly explained, informed consent was obtained.  Digital rectal exam was performed and revealed tender.   The  endoscope was introduced through the anus and advanced to the cecum, which was identified by both the appendix and ileocecal valve, without limitations.  The quality of the prep was good, using MiraLax.  The instrument was then slowly withdrawn as the colon was fully examined. <<PROCEDUREIMAGES>>                    <<OLD IMAGES>>  FINDINGS:  Internal Hemorrhoids were found (see image11, image10, and image9). first grade int hems, edematous  Moderate diverticulosis was found (see image4 and image1). narrow sigmoid colon with tortuous lumen  A sessile polyp was found in the sigmoid colon. The polyp was removed using cold biopsy forceps (see image7, image6, and image5). 4 mm polyp within a diverticulum at 20 cm  Otherwise normal colonoscopy without other polyps, masses, vascular ectasias, or inflammatory changes (see image3 and image2).   Retroflexed views in the rectum revealed no abnormalities.    The scope was then withdrawn from the patient and the procedure completed.  COMPLICATIONS:   None ENDOSCOPIC IMPRESSION:  1) Internal hemorrhoids  2) Moderate diverticulosis  3) Sessile polyp in the  sigmoid colon RECOMMENDATIONS:  Anusol HC supp 1 hs  Metamucil 1 tsp daily  bleeding likely due to hemorrhoids REPEAT EXAM:   In 10 year(s) for.   _______________________________ Hedwig Morton. Juanda Chance, MD  CC:

## 2010-02-19 NOTE — Letter (Signed)
Summary: Patient Notice- Polyp Results  [facility]  [address] [place], [place] [address]   Phone: [phone]  Fax: [phone]        February 10, 2010 MRN: [id number]    [name] [address] [place], [place]  [address]    Dear [name],  I am pleased to inform you that the colon polyp(s) removed during your recent colonoscopy was (were) found to be benign (no cancer detected) upon pathologic examination.The polyp consisted of inflamed tissue    Should you develop new or worsening symptoms of abdominal pain, bowel habit changes or bleeding from the rectum or bowels, please schedule an evaluation with either your primary care physician or with me.  Additional information/recommendations:  __ No further action with gastroenterology is needed at this time. Please      follow-up with your primary care physician for your other healthcare      needs.  __ Please call [phone] to schedule a return visit to review your      situation.  __ Please keep your follow-up visit as already scheduled.  _x_ Continue treatment plan as outlined the day of your exam.  Please call us if you are having persistent problems or have questions about your condition that have not been fully answered at this time.  Sincerely,  Hart Carwin MD  This letter has been electronically signed by your physician.  Appended Document: Patient Notice- Polyp Results Letter mailed to patient.

## 2010-02-19 NOTE — Miscellaneous (Signed)
  Clinical Lists Changes 

## 2010-03-19 LAB — POCT I-STAT, CHEM 8
BUN: 10 mg/dL (ref 6–23)
Creatinine, Ser: 0.7 mg/dL (ref 0.4–1.2)
Sodium: 138 mEq/L (ref 135–145)
TCO2: 28 mmol/L (ref 0–100)

## 2010-03-21 LAB — CBC
HCT: 37 % (ref 36.0–46.0)
Hemoglobin: 12.2 g/dL (ref 12.0–15.0)
MCH: 29.5 pg (ref 26.0–34.0)
MCH: 29.6 pg (ref 26.0–34.0)
MCV: 89.8 fL (ref 78.0–100.0)
Platelets: 245 10*3/uL (ref 150–400)
Platelets: 322 10*3/uL (ref 150–400)
RBC: 3.35 MIL/uL — ABNORMAL LOW (ref 3.87–5.11)
RBC: 4.12 MIL/uL (ref 3.87–5.11)
RDW: 16.7 % — ABNORMAL HIGH (ref 11.5–15.5)
WBC: 8.3 10*3/uL (ref 4.0–10.5)

## 2010-03-21 LAB — COMPREHENSIVE METABOLIC PANEL
BUN: 9 mg/dL (ref 6–23)
CO2: 31 mEq/L (ref 19–32)
Chloride: 98 mEq/L (ref 96–112)
Creatinine, Ser: 0.67 mg/dL (ref 0.4–1.2)
GFR calc non Af Amer: >60 mL/min (ref >60)
Glucose, Bld: 96 mg/dL (ref 70–99)
Total Bilirubin: 0.6 mg/dL (ref 0.3–1.2)

## 2010-03-21 LAB — PROTIME-INR
INR: 0.92 (ref 0.00–1.49)
INR: 2.4 — ABNORMAL HIGH (ref 0.00–1.49)
Prothrombin Time: 12.3 seconds (ref 11.6–15.2)
Prothrombin Time: 13.7 seconds (ref 11.6–15.2)
Prothrombin Time: 26 seconds — ABNORMAL HIGH (ref 11.6–15.2)

## 2010-03-21 LAB — SURGICAL PCR SCREEN: MRSA, PCR: NEGATIVE

## 2010-03-21 LAB — URINALYSIS, ROUTINE W REFLEX MICROSCOPIC
Bilirubin Urine: NEGATIVE
Ketones, ur: NEGATIVE mg/dL
Nitrite: POSITIVE — AB
Urobilinogen, UA: 0.2 mg/dL (ref 0.0–1.0)

## 2010-03-21 LAB — APTT: aPTT: 40 seconds — ABNORMAL HIGH (ref 24–37)

## 2010-04-17 ENCOUNTER — Telehealth: Payer: Self-pay | Admitting: Cardiology

## 2010-04-17 NOTE — Telephone Encounter (Signed)
SPOKE WITH PT  DID NOT SEE ANY DOCUMENTATION  OF ANY RECENT CALL FROM OUR OFFICE WILL FORWARD TO HEATHER SCHUB RN FOR REVIEW ON MON  AND WILL HAVE HER CALL PT AT THAT TIME.

## 2010-04-17 NOTE — Telephone Encounter (Signed)
Spoke w/pt, Dr Myrtis Ser and received form for surgical clearance for right knee surgery, per Dr Myrtis Ser pt cleared and ok to hold coumadin as long as pt is not having any problems.  Patient states she is not having any problems, no chest pain or SOB, pt aware she is cleared form faxed back to [facility]

## 2010-04-27 ENCOUNTER — Encounter: Payer: MEDICARE | Admitting: Cardiology

## 2010-05-19 NOTE — Op Note (Signed)
[name], [name]                ACCOUNT NO.:  [id number]   MEDICAL RECORD NO.:  [id number]          PATIENT TYPE:  OIB   LOCATION:  1610                         FACILITY:  [facility]   PHYSICIAN:  Marlowe Kays, M.D.  DATE OF BIRTH:  [birth year]   DATE OF PROCEDURE:  05/19/2006  DATE OF DISCHARGE:                               OPERATIVE REPORT   PREOPERATIVE DIAGNOSES:  1. Torn rotator cuff.  2. Osteoarthritis, acromioclavicular joint.  3. Possible loose body, left shoulder joint.   POSTOPERATIVE DIAGNOSES:  1. Torn rotator cuff.  2. Osteoarthritis, acromioclavicular joint.  3. No loose bodies detected in left shoulder joint.   OPERATION:  1. Left shoulder arthroscopy with nothing operable detected.  2. Open distal clavicle resection.  3. Anterior acromionectomy and repair of large retracted rotator cuff      tear.   SURGEON:  Marlowe Kays, M.D.   ASSISTANTDruscilla Brownie. Idolina Primer, P.A.-C   ANESTHESIA:  General.   PATHOLOGY AND JUSTIFICATION FOR PROCEDURE:  She has had persistent pain  in her left shoulder following a fall on April 12, 2006, leading to an  MRI of the left shoulder on April 27, 2006 showing the above pathology.  There was some question about a loose body in the joint and accordingly,  I felt that an arthroscopy would be indicated.   PROCEDURE:  With prophylactic antibiotic, satisfactory general  anesthesia, beach-chair position on the Allen frame, left shoulder  girdle was prepped with DuraPrep and draped into a sterile field.  The  anatomy of the shoulder joint was marked out with a posterior soft spot  portal and the incision for the distal clavicle resection and rotator  cuff repair infiltrated with 0.25% Marcaine with adrenalin.  Through a  posterior soft spot portal, I atraumatically entered the glenohumeral  joint.  Labrum and biceps tendon were unremarkable.  We saw no loose  bodies.  Consequently, I evacuated fluid from the joint and we placed  two 4-0 nylon sutures in the portal, then applied a Steri-Drape and made  my initial incision with the distal clavicle curving downward in the  usual vertical rotator cuff fashion distal to the acromion.  The  incision was carried down to the distal clavicle with the [facility] joint being  identified.  I undermined the distal clavicle a centimeter and a half  from the [facility] joint and protecting it with baby Bennett retractors,  osteotomized the clavicle at this point with a micro saw.  I then  removed the distal clavicle with towel clip and cautery technique and  removed several small spicules of bone from the apparent clavicle, which  I then covered with bone wax.  I then continued the dissection  lateralward, dissecting the fascia over the anterior acromion and a  marking out my initial anterior acromionectomy resection site.  Protecting the underlying tissues with Cobb elevator, my first anterior  acromionectomy was performed and I then performed a good bit of  additional undersurface removal of bone to completely decompress the  subacromial space.  Basically, a complete rotator cuff tear was  identified and  had substantial retraction; I took a picture of this.  Fortunately, the rotator cuff was flexible and placing the arm on a Mayo  stand with some slight abduction, I denuded the lateral humeral head at  the greater tuberosity with a small rongeur.  I then used 1 rotator cuff  anchor at about this level, which brought the rotator cuff down to its  normal position.  I then supplemented this with multiple #1 Ethibond  sutures throughout the remainder of the course of the rotator cuff,  working all the way posteriorly, since there was some lateral rotator  cuff tissue present.  I then supplemented the tail-end portion of the  rotator cuff, sewing it to the bursa laterally with multiple interrupted  #1 Vicryl, which seemed to give a nice stable repair, although her  rotator cuff tissue was not very  healthy.  I then irrigated the wound  well with sterile saline and placed Gelfoam in the distal clavicle  resection  site.  I injected the soft tissue with 0.5% Marcaine with  adrenalin and closed the wound with interrupted #1 Vicryl in the fascia  over the distal acromion and the small deltoid cut, subcutaneous tissue  with 2-0 Vicryl and staples in the skin, dry sterile dressing and  shoulder immobilizer with her arm abducted to about 45 degrees.  She  tolerated the procedure well and was taken to the recovery room in  satisfactory condition with no known complications.           ______________________________  Marlowe Kays, M.D.     JA/MEDQ  D:  05/19/2006  T:  05/20/2006  Job:  [id number]

## 2010-05-19 NOTE — Assessment & Plan Note (Signed)
[facility]                            CARDIOLOGY OFFICE NOTE   [name], [name]                       MRN:          [id number]  DATE:12/13/2007                            DOB:          July 12, 1932    [name] is seen for followup.  I saw her on November 14, 2007.  She  had some increased palpitations.  She has been on flecainide for her  atrial fib.  We decided to push her flecainide dose higher.  She is on  100 mg b.i.d.  She is feeling better with this and not having any  significant symptoms.  Today in the office, she is feeling well.  She  has had no syncope or presyncope.  She has had no chest pain.   ALLERGIES:  PENICILLIN.   MEDICATIONS:  Atenolol, Celebrex, Levoxyl, Coumadin, furosemide,  aspirin, zolpidem, amitriptyline, flecainide 100 p.o. b.i.d.   OTHER MEDICAL PROBLEMS:  See the list on the note of November 14, 2007.   REVIEW OF SYSTEMS:  She is not having any GI or GU symptoms.  She has no  fevers or chills or skin rashes.  She is having some mild headaches.  I  do not think this is from her flecainide.  Otherwise, her review of  systems is negative.   PHYSICAL EXAMINATION:  Blood pressure is 148/84.  Her weight is up 164  pounds.  The patient is oriented to person, time, and place.  Affect is  normal.  HEENT reveals no xanthelasma.  She has normal extraocular  motion.  There is no carotid bruit heard today.  Her abdomen is soft.  She has no peripheral edema.   The patient had a carotid Doppler because of a bruit that I heard on her  last visit.  She has normal carotid artery.  Flecainide level has been  drawn, but the final result is not yet ready.  We will review this and  be in touch with the patient.   Because her flecainide dose has been increased, the indication is for a  followup standard treadmill.  I will arrange this for sometime in  January.     Luis Abed, MD, [facility]  Electronically Signed    JDK/MedQ   DD: 12/13/2007  DT: 12/13/2007  Job #: [id number]   cc:   Delaney Meigs, M.D.

## 2010-05-19 NOTE — Assessment & Plan Note (Signed)
[facility]                            CARDIOLOGY OFFICE NOTE   AMILLIANA, HAYWORTH                       MRN:          [id number]  DATE:11/14/2007                            DOB:          12/23/1932    Ms. Chagnon is seen for followup.  She has had some paroxysmal atrial fib  in the past, but it has been controlled with flecainide.  We will use a  very low-dose with low levels.  Recently, she has had some increased  symptoms.  She may be having more atrial fib.  She has not had any chest  pain.  There has been no syncope or presyncope.  She has also noted some  hot flashes.  I do not believe this is related to her rhythm or any of  her cardiac medications.   PAST MEDICAL HISTORY:   ALLERGIES:  PENICILLIN.   MEDICATIONS:  1. Atenolol 50.  2. Flecainide 50 b.i.d. (dose to be increased to 100 b.i.d.)  3. Celebrex.  4. Levoxyl.  5. Coumadin.  6. Furosemide.  7. Baby aspirin.  8. Zolpidem.  9. Amitriptyline.   OTHER MEDICAL PROBLEMS:  See the complete list below.   REVIEW OF SYSTEMS:  She has no GI or GU complaints.  She has no fevers,  chills, or headaches.  Her review of systems otherwise is negative.   PHYSICAL EXAMINATION:  VITAL SIGNS:  Blood pressure is 149/82.  Pulse is  66.  GENERAL:  The patient is oriented to person, time, and place.  Affect is  normal.  HEENT:  No xanthelasma.  She has normal extraocular motion.  NECK:  There are no carotid bruits.  There is no jugular venous  distention.  The patient does have a soft right carotid bruits.  LUNGS:  Clear.  Respiratory effort is not labored.  CARDIAC:  An S1 with an S2.  There are no clicks or significant murmurs.  ABDOMEN:  Soft.  She has no peripheral edema.   PROBLEMS:  1. History of paroxysmal atrial fibrillation in the past.  She had      been on low-dose flecainide.  We will now increase her dose to 100      mg b.i.d.  I will then see her back.  We will check a level.   We      will consider whether followup exercise testing is needed.  We will      also consider later, if she needs a followup Holter.  2. Coumadin therapy as directed.  3. Family history of coronary artery disease.  4. Hypothyroidism with a levels followed by Dr. Lysbeth Galas.  5. Abnormal blood pressure which needs to be followed over time.  6. Right carotid bruit.  This is a new problem.  We will arrange for      carotid Dopplers to assess this.     Luis Abed, MD, [facility]  Electronically Signed    JDK/MedQ  DD: 11/14/2007  DT: 11/15/2007  Job #: [id number]   cc:   Delaney Meigs, M.D.

## 2010-05-19 NOTE — Assessment & Plan Note (Signed)
[facility]                            CARDIOLOGY OFFICE NOTE   BEXLEE, BERGDOLL                       MRN:          [id number]  DATE:11/23/2006                            DOB:          September 07, 1932    Ms. [name] is seen for followup.  She has paroxysmal atrial fib that has  been nicely controlled with flecainide over the years.  She is on  Coumadin.  Recently, her flecainide level was checked and it was low  therapeutic at 0.11.  She had not been seen in the office for several  years and we arranged for followup.  She is doing well.  She does have  some bursts of atrial fib that are very infrequent.  She tolerates this  well and goes about her activities.  Rarely, she will take an extra dose  of flecainide. In her case, this is safe, since her level is so low.   PAST MEDICAL HISTORY:  Allergies:  PENICILLIN.   Medications:  1. Atenolol.  2. Flecainide 50 b.i.d.  3. Celebrex.  4. Levoxyl.  5. Coumadin.  6. Furosemide.  7. Baby aspirin.  8. Zolpidem.  9. Amitriptyline 25.   Other Medical Problems:  See the list below.   REVIEW OF SYSTEMS:  She is really doing well.  Her review of systems  otherwise is negative.   PHYSICAL EXAM:  Blood pressure is 160/85.  She needs blood pressure  followup.  Her pulse is 69.  The patient is oriented to person, time and  place.  Affect is normal.  HEENT:  Reveals no xanthelasma.  She has normal extraocular motion.  There are no carotid bruits.  There is no jugular venous distention.  LUNGS:  Clear.  Respiratory effort is not labored.  CARDIAC EXAM:  Reveals an S1 with an S2.  There are no clicks or  significant murmurs.  ABDOMEN:  Soft.  There are no masses or bruits.  She has no peripheral  edema.   EKG reveals sinus rhythm.   PROBLEMS:  1. Paroxysmal atrial fibrillation controlled on low-dose flecainide.      Her level is low.  She is stable.  We have not changed her dose.      However, if she has  increased symptoms her dose can definitely be      increased.  2. History of Coumadin therapy.  3. Family history of coronary disease.  4. Hypothyroidism, treated.  5. Blood pressure elevated today.  She needs blood pressure followup.   I will see her back in 1 year for followup.     Luis Abed, MD, [facility]  Electronically Signed    JDK/MedQ  DD: 11/23/2006  DT: 11/23/2006  Job #: [id number]   cc:   Delaney Meigs, M.D.

## 2010-05-20 ENCOUNTER — Other Ambulatory Visit: Payer: Self-pay | Admitting: Orthopedic Surgery

## 2010-05-20 ENCOUNTER — Encounter (HOSPITAL_COMMUNITY): Payer: Medicare Other

## 2010-05-20 LAB — BASIC METABOLIC PANEL
BUN: 14 mg/dL (ref 6–23)
CO2: 30 mEq/L (ref 19–32)
Calcium: 8.5 mg/dL (ref 8.4–10.5)
Chloride: 97 mEq/L (ref 96–112)
Creatinine, Ser: 0.67 mg/dL (ref 0.4–1.2)
Glucose, Bld: 89 mg/dL (ref 70–99)

## 2010-05-20 LAB — DIFFERENTIAL
Basophils Absolute: 0 10*3/uL (ref 0.0–0.1)
Basophils Relative: 1 % (ref 0–1)
Eosinophils Absolute: 0.3 10*3/uL (ref 0.0–0.7)
Monocytes Absolute: 0.8 10*3/uL (ref 0.1–1.0)
Monocytes Relative: 12 % (ref 3–12)
Neutro Abs: 2.7 10*3/uL (ref 1.7–7.7)
Neutrophils Relative %: 43 % (ref 43–77)

## 2010-05-20 LAB — PROTIME-INR: Prothrombin Time: 24.4 seconds — ABNORMAL HIGH (ref 11.6–15.2)

## 2010-05-20 LAB — CBC
Hemoglobin: 10.4 g/dL — ABNORMAL LOW (ref 12.0–15.0)
MCH: 24.3 pg — ABNORMAL LOW (ref 26.0–34.0)
MCHC: 30.9 g/dL (ref 30.0–36.0)
Platelets: 341 10*3/uL (ref 150–400)
RDW: 17.6 % — ABNORMAL HIGH (ref 11.5–15.5)

## 2010-05-20 LAB — URINALYSIS, ROUTINE W REFLEX MICROSCOPIC
Glucose, UA: NEGATIVE mg/dL
Ketones, ur: NEGATIVE mg/dL
Nitrite: NEGATIVE
Protein, ur: NEGATIVE mg/dL
Urobilinogen, UA: 0.2 mg/dL (ref 0.0–1.0)

## 2010-05-20 LAB — SURGICAL PCR SCREEN: MRSA, PCR: NEGATIVE

## 2010-05-22 NOTE — Discharge Summary (Signed)
   NAME:  [name], [name]                          ACCOUNT NO.:  [id number]   MEDICAL RECORD NO.:  [id number]                   PATIENT TYPE:  INP   LOCATION:  4727                                 FACILITY:  [facility]   PHYSICIAN:  Rollene Rotunda, M.D.                DATE OF BIRTH:  [birth year]   DATE OF ADMISSION:  09/30/2002  DATE OF DISCHARGE:  10/01/2002                           DISCHARGE SUMMARY - REFERRING   DISCHARGE DIAGNOSIS:  1. Paroxysmal atrial fibrillation, now in normal sinus rhythm.  2. Hypertension, resolved.  3. Hypothyroidism, treated.  4. Osteoarthritis.  5. Allergic rhinitis.   HISTORY OF PRESENT ILLNESS:  Ms. [name] is a 77 year old female with a  history of paroxysmal atrial fibrillation since 1985.  In 2001, she was  treated with  Ibutilide and converted to normal sinus rhythm.  In 2002, she  underwent flecainide therapy and converted to normal sinus rhythm.  In  February 2004, she was admitted with atrial fibrillation and converted  spontaneously to normal sinus rhythm.  Around midnight to early a.m. of  09/30/2002, she was dancing and noted an increased heart rate.  She  complained of butterflies in her neck.  She went home and took 1/2 atenolol,  could not sleep due to increased heart rate.  She presented to the emergency  room approximately 12 hours later and was admitted.   She remained in atrial fibrillation during the night.  She is on long-term  Coumadin use.  Her INR is therapeutic.  The next morning, she was given  flecainide 300 mg p.o. x 1.  Within an hour, though, she did convert back to  normal sinus rhythm.  She has remained in sinus rhythm for approximately  four hours, and we will discharge he to home now.   DISCHARGE MEDICATIONS:  Includes her admission medications:  1. Atenolol 50 mg 1/2 tablet b.i.d.  2. Celebrex 200 mg a day.  3. Coumadin 5 mg daily except on Friday and Sunday she takes 7.5 mg.  4. Levoxyl 50 mcg daily.  5. Loratidine 10 mg  a day.  6. Baby aspirin daily.   ADMISSION LABORATORY DATA:  INR 2.9.  Isoenzymes negative.  Sodium 139,  potassium 3.9, BUN 10, creatinine 0.8.  Hemoglobin 14.1, hematocrit 41.4,  platelets 313, white count 7.3.   Chest x-ray: No acute findings.   DISPOSITION:  The patient is discharged to home on home medications.  She is  discharged in stable condition.      Guy Franco, P.A. [facility]                      Rollene Rotunda, M.D.    LB/MEDQ  D:  10/01/2002  T:  10/01/2002  Job:  [id number]   cc:   Willa Rough, M.D.   Raphael Gibney, M.D.  [employer]

## 2010-05-22 NOTE — Op Note (Signed)
[name], [name]                ACCOUNT NO.:  [id number]   MEDICAL RECORD NO.:  [id number]          PATIENT TYPE:  AMB   LOCATION:  DAY                          FACILITY:  [facility]   PHYSICIAN:  Vania Rea. Supple, M.D.  DATE OF BIRTH:  12-Aug-1932   DATE OF PROCEDURE:  05/14/2004  DATE OF DISCHARGE:                                 OPERATIVE REPORT   PREOPERATIVE DIAGNOSES:  1.  Massive retracted right shoulder rotator cuff tear.  2.  Right shoulder acromioclavicular joint arthrosis.  3.  Chronic right shoulder impingement syndrome.   POSTOPERATIVE DIAGNOSES:  1.  Massive retracted right shoulder rotator cuff tear.  2.  Right shoulder acromioclavicular joint arthrosis.  3.  Chronic right shoulder impingement syndrome.   PROCEDURES:  1.  Open right shoulder rotator cuff repair.  2.  Subacromial decompression.  3.  Distal clavicle resection.   SURGEON:  Vania Rea. Supple, M.D.   Threasa HeadsFrench Ana A. Shuford, P.A.-C.   ANESTHESIA:  General endotracheal as well as a preop interscalene block.   ESTIMATED BLOOD LOSS:  Minimal.   HISTORY:  [name] is a 77 year old female who noted the abrupt onset of  right shoulder pain back in February of this year, which has been refractory  to multiple prolonged attempts at conservative management.  A preoperative  MRI scan confirms a large retracted tear of the rotator cuff.  She also is  having this for [facility] joint arthrosis.  Due to her ongoing pain and functional  limitations, she is brought to the operating room at this time for planned  right shoulder rotator cuff repair, as described below.   Preoperatively discussed with [name] treatment options as well as risks  versus benefits thereof.  Possible surgical complications of bleeding,  infection, neurovascular injury, persistent pain, loss of motion,  __________, recurrence of rotator cuff tear and possible need for additional  surgery were all reviewed.  She understands, accepts, and  agrees with our  planned procedure.   PROCEDURE IN DETAIL:  After undergoing routine preoperative evaluation, the  patient received prophylactic antibiotics.  Interscalene block was  established in the holding area by the anesthesia department.  Placed supine  on the operating room table, underwent induction of general endotracheal  anesthesia.  Placed in the beach-chair position and appropriately padded and  protected.  The right shoulder girdle region was sterilely prepped and  draped in a standard fashion.  The proposed skin incision was infiltrated  with 7 cc of 0.5% Marcaine with epi.  The incision was then outlined from  the [facility] joint, extending laterally and distally, for a total length of  approximately 6 cm.  Sharp dissection was carried through the skin and  subcutaneous tissue, and the deltoid was split in line with its fibers at  the junction between the anterior and mid thirds.  Subperiosteal dissection  was then used to expose the distal clavicle and the anterior acromion.  An  oscillating saw was then used to remove the distal 1 cm of the clavicle.  The oscillating saw was then used to perform an  anterior and inferior  acromionectomy, removing a prominent anterior acromial hook.  The  subacromial space was then entered, and multiple adhesions and abundant  bursal tissue was encountered, and this was removed with a combination of  sharp and blunt dissection.  The rotator cuff was completely mobilized, and  the subdeltoid bursa was opened.  There was a large retracted tear of the  rotator cuff involving an entire superior and inferior supinatus.  The  rotator interval was significantly deficient, and the distal end of the  superior and inferior supinatus had retracted significantly posteriorly and  medially, and covering the entire humeral head.  The rotator cuff margin was  freed on the superior and inferior aspects.  On inspection, there was a  large scarred, clump of tendon  tissue, which was dissected free and  removed.  The biceps tendon was inspected and was found to be intact and  stable within the bicipital groove.  A grasping suture of #2 fiber wire was  placed at the free margin of the rotator cuff torn margin.  With this  retraction, we were able to mobilize the rotator cuff to a point where it  could be brought back to the greater tuberosity.  An additional grasping  suture was placed in the free margin, and then a side-to-side repair was  performed, closing the rotator interval between the anterior margin of the  supraspinatus and the upper margin of the subscapularis.  We did use an  osteotome to remove residual soft tissue and created a bony bed on the apex  of the greater tuberosity at the rotator cuff tear site.  The Concept  tenaculum was then used to make two bone tunnels across the greater  tuberosity and brought out through the cortex of the proximal humerus.  The  wound was then copiously irrigated.  The rotator cuff was completely  immobilized.  The sutures in the free margin of the rotator cuff were then  passed through the bone tunnels, and these were then tied sequentially from  posterior to anterior.  The rotator interval closure was also completed, and  this allowed excellent coverage of the humeral head, and the free margin  rotator cuff was nicely brought back and in contact with the bony bed on the  greater tuberosity.  The arm could be brought down to the side with some  mild tension across the repair but no evidence for displacement.  At this  point, the final irrigation was completed.  Hemostasis was obtained.  The  deltoid was repaired to the anterior acromion with a #2 Ethibond suture  through bone tunnels.  The remaining repair is with side-to-side figure-of-  eight #1 Vicryl sutures for over-sewing and completion of the deltoid fascial split.  A 2-0 Vicryl was used for the subcutaneous layer and  intracuticular 3-0 Monocryl  used for the skin, followed by Steri-Strips.  A  bulky dry dressing was applied.  The right arm was placed to a Irena Cords Ultra  sling with a small abduction pillow.  The patient was then extubated and  taken to the recovery room in stable condition.      KMS/MEDQ  D:  05/14/2004  T:  05/14/2004  Job:  [id number]

## 2010-05-22 NOTE — H&P (Signed)
[place]. [facility]  Patient:    [name], [name] Visit Number: [id number] MRN: [id number]          Service Type: MED Location: [phone] Attending Physician:  Talitha Givens Dictated by:   Luis Abed, M.D. [facility] Admit Date:  05/18/2001 Discharge Date: 05/19/2001   CC:         Delaney Meigs, M.D., [place], [place]   History and Physical  HISTORY OF PRESENT ILLNESS: The patient has a history of paroxysmal atrial fibrillation.  She knows when she has atrial fib, and on two occasions in the past she was converted with medications.  In 2000 she received 300 mg of flecainide and converted and in 2001 she received ibutilide.  She also converted then.  Last night she went back into atrial fib and it continued this morning, and she is now here for further evaluation.  She is not having any chest pain.  She has a history of hypothyroidism, on low-dose replacement. TSH is still pending at this time.  She has not had any significant chest pain.  ALLERGIES: There are no known allergies that I am aware of at this time.  MEDICATIONS:  1. Atenolol 50 mg q.d.  2. Provera.  3. Celebrex.  4. Ogen.  5. Levoxyl 50 mcg.  6. Claritin.  7. Aspirin 81 mg.  PAST MEDICAL HISTORY: See the complete problem list below.  SOCIAL HISTORY: [place]  She is a Interior and spatial designer.  She does not drink or smoke.  She dances several times a week and she is active.  FAMILY HISTORY: There is a family history of coronary disease, with her father dying at age 16 of heart disease.  Her mother does not have significant heart disease.  REVIEW OF SYSTEMS: The patient noted the palpitations.  She is not having any nausea, vomiting, diaphoresis.  There is no cough or dysuria.  There are no fevers or chills.  The remainder of her Review Of Systems is negative.  PHYSICAL EXAMINATION:  VITAL SIGNS: Temperature is 98.1 degrees, blood pressure 123/54,  pulse at this time is 100, respirations 18.  GENERAL: She appears quite stable.  HEENT: No significant abnormalities.  Mouth reveals no significant abnormalities.  NECK: No bruits.  LUNGS: Clear.  CARDIAC: S1 and S2.  No clicks or significant murmurs.  ABDOMEN: Benign.  EXTREMITIES: She has no significant peripheral edema.  There might be trace edema.  NEUROLOGIC: Grossly intact.  SKIN: No significant rashes.  LABORATORY DATA: At this point her renal and potassium are normal.  Her hemoglobin is normal.  EKG reveals atrial fibrillation with nonspecific ST-T wave changes.  The rate is reasonably controlled.  Chest x-ray reveals no significant abnormalities at this time.  PROBLEMS:  1. Strong family history of coronary disease.  2. History of allergic rhinitis.  3. Hypothyroidism, on medication.  4. History of hypertension. *5. Paroxysmal atrial fibrillation by history and return to atrial     fibrillation today.  This is the major issue today.  I suspect that it     is random return to her atrial fibrillation.  She is less than 24 hours     at this point.  I feel we should go ahead and try to convert her.  PLAN:  1. She is to receive IV heparin at this time, followed by starting her     Coumadin.  2. She will then receive flecainide 300 mg p.o. to  see if it converts her.  3. She will be given an hour to absorb this before she will be allowed to eat     anything.  4. The patient will then be NPO after midnight and we will proceed with     electrical cardioversion on May 19, 2001 if she does not convert.  5. She will also get a dose of her beta-blocker tonight.  6. I will decide over time with her about the possibility of long-term     Coumadin therapy.  7. Also, she can have a follow-up outpatient 2D echocardiogram on a visit     with me.  Hopefully she will convert on flecainide. Dictated by:   Luis Abed, M.D. [facility] Attending Physician:  Talitha Givens DD:  05/18/01 TD:  05/20/01 Job: [id number]

## 2010-05-22 NOTE — Discharge Summary (Signed)
[place]. [facility]  Patient:    [name], [name] Visit Number: [id number] MRN: [id number]          Service Type: MED Location: [phone] Attending Physician:  Talitha Givens Dictated by:   Guy Franco, P.A.-C. Admit Date:  05/18/2001 Discharge Date: 05/19/2001   CC:         [name], M.D., [address], [place], [place] [address]   Referring Physician Discharge [place]  DATE OF BIRTH:  June 29, 1932  DISCHARGE DIAGNOSES: 1. Paroxysmal atrial fibrillation, currently in normal sinus rhythm. 2. Hypothyroidism. 3. Family history of coronary artery disease.  HISTORY:  [name] is a 77 year old female patient, who was admitted on May 18, 2001, with new onset atrial fibrillation.  She has had a history of PAF and converted in the past with flecainide as well as Ibutilide.   HOSPITAL COURSE:  In the emergency room, she was started on IV heparin as well as oral Coumadin.  She was given 300 mg of oral flecainide and continued on her current medications.  She subsequently converted back to normal sinus rhythm with these medications and did not require DCCV.  She remained in normal sinus rhythm overnight and was prepared for discharge to home the following day.  DISCHARGE MEDICATIONS: 1. Claritin 10 mg 1 p.o. q.d. p.r.n. 2. Levoxyl 50 mcg 1 p.o. q.d. 3. Celebrex 200 mg q.d. 4. Atenolol 50 mg q.d. 5. Baby aspirin 81 mg q.d. 6. Hormones, as per her primary physician. 7. Coumadin 5 mg 1 p.o. q.d.  ACTIVITY:  As tolerated.  DIET:  Low fat.  INSTRUCTIONS:  Call for any questions or concerns.  FOLLOW-UP: 1. She is to follow up with [name] on Jun 02, 2001, at  2:45 p.m. 2. She will need to have blood work done next week with [name], and I am    going to try to arrange this prior to her discharge. Dictated by:   Guy Franco, P.A.-C. Attending Physician:  Talitha Givens DD:  05/19/01 TD:  05/19/01 Job: [id number]

## 2010-05-22 NOTE — H&P (Signed)
NAME:  [name], [name]                          ACCOUNT NO.:  [id number]   MEDICAL RECORD NO.:  [id number]                   PATIENT TYPE:  INP   LOCATION:  4727                                 FACILITY:  [facility]   PHYSICIAN:  Rollene Rotunda, M.D.                DATE OF BIRTH:  Jun 22, 1932   DATE OF ADMISSION:  09/30/2002  DATE OF DISCHARGE:                                HISTORY & PHYSICAL   PRIMARY CARE PHYSICIAN:  Delaney Meigs, M.D.   CARDIOLOGIST:  Willa Rough, M.D.   REASON FOR ADMISSION:  Patient with atrial fibrillation.   HISTORY OF PRESENT ILLNESS:  The patient is a pleasant 77 year old with a  history of paroxysmal atrial fibrillation.  She has had four or five  admissions by her report.  She has required chemical cardioversion with  flecainide and ibutilide in the past.  She has not had DC cardioversion.  Typically she converts spontaneously.  She developed her current symptoms at  midnight while dancing.  She did take one half of a 50 mg atenolol  initially.  She had subsequently taken a total of 100 mg because of  persistent tachy palpitations.  She has not had presyncope or syncope.  She  has not had chest pain, neck discomfort, lung discomfort.  She feels  somewhat uncomfortable with the rapid rate.  She again has had no syncope.  She has had no chest discomfort, neck discomfort, arm discomfort, __________  and denies nausea, vomiting, diaphoresis.  She denies any shortness of  breath.   PAST MEDICAL HISTORY:  1. Hypothyroidism.  2. Hypertension.  3. Allergic rhinitis.  4. Osteoarthritis.  5. Paroxysmal atrial fibrillation (the patient has had a negative stress     perfusion study and echocardiogram in the past by her report).   ALLERGIES:  None.   CURRENT MEDICATIONS:  1. Atenolol 25 mg b.i.d.  2. Celebrex 200 mg daily.  3. Coumadin/  4. Levoxyl 50 mcg per day.  5. Loratadine 10 mg daily.  6. Baby aspirin.   SOCIAL HISTORY:  She lives in [place].   She is a Interior and spatial designer.  She drinks  alcohol socially and rarely.  She drinks three or four cups of caffeine per  day.  She does not smoke cigarettes.   FAMILY HISTORY:  Remarkable for her father having a myocardial infarction  and dying at age 70.   REVIEW OF SYSTEMS:  Positive for menopause with hot flashes, arthralgia with  pain in knees, hemorrhoids.  Otherwise as stated in the HPI.   PHYSICAL EXAMINATION:  GENERAL:  The patient is in no distress.  VITAL SIGNS:  Blood pressure 77/42 (after bolus of IV Cardizem), heart rate  123 and irregular.  HEENT:  Eyes unremarkable.  Pupils equal, round, and reactive to light.  Fundi are visualized.  Oral mucosa unremarkable.  NECK:  No jugular venous distention.  Wave form within normal  limits.  Carotid upstroke brisk and symmetric, no bruits, thyromegaly.  LYMPHATICS:  No cervical, axillary, inguinal adenopathy.  LUNGS:  Clear to auscultation bilaterally.  BACK:  No costovertebral angle tenderness.  CHEST:  Unremarkable.  HEART:  PMI not displaced or sustained.  S1 and S2 within normal limits.  No  S3, no murmurs.  ABDOMEN:  Flat.  Positive bowel sounds, normal in frequency, pitch.  No  bruits, no rebound, no guarding, no midline pulsatile mass, no organomegaly.  SKIN:  No rash, nodules.  EXTREMITIES:  2+ pulses throughout, no edema, no cyanosis, clubbing.  NEUROLOGIC:  Oriented to person, place, and time.  Cranial nerves II-XII  grossly intact.  Motor grossly intact.   LABORATORY DATA:  EKG:  Atrial fibrillation with rapid ventricular response,  axis within normal limits.  Intervals within normal limits.  No __________  T-wave changes.   WBC 7.3, hemoglobin 14.1, platelets 313.  INR 2.9, CK 111.  Sodium 139,  potassium 3.9, chloride 105, BUN 10, creatinine 0.8.  AST 22, ALT 20.  Alkaline phosphatase 61, total bilirubin 0.7.   ASSESSMENT/PLAN:  Atrial fibrillation.  The patient has recurring atrial  fibrillation.  The plan is admission  overnight and IV Cardizem.  She will  also be given one dose of p.o. digoxin.  If she does not convert  spontaneously by the a.m., then would consider chemical versus electrical  cardioversion.                                                   Rollene Rotunda, M.D.    JH/MEDQ  D:  09/30/2002  T:  09/30/2002  Job:  [id number]   cc:   Delaney Meigs, M.D.  [address]  [place]  [place] [address]  Fax: [phone]

## 2010-05-22 NOTE — Discharge Summary (Signed)
   NAME:  [name], [name]                          ACCOUNT NO.:  [id number]   MEDICAL RECORD NO.:  [id number]                   PATIENT TYPE:  INP   LOCATION:  3712                                 FACILITY:  [facility]   PHYSICIAN:  Willa Rough, M.D. [facility]              DATE OF BIRTH:  [birth year]   DATE OF ADMISSION:  02/28/2002  DATE OF DISCHARGE:  03/02/2002                           DISCHARGE SUMMARY - REFERRING   DISCHARGE DIAGNOSES:  1. Atrial fibrillation now in normal sinus rhythm.  2. Chest pain resolved.  3. Long term Coumadin therapy.  4. Hypothyroidism treated.  5. Hypertension treated.  6. Allergic rhinitis.   HOSPITAL COURSE:  The patient is a 77 year old female patient of Dr. Jerral Bonito who has a history of paroxysmal atrial fibrillation.  She was seen in  the emergency room on 02/28/02 and was noted to be in atrial fibrillation  with RVR.  She was seen in consultation by Dr. Sharrell Ku who admitted her  to [facility].  She spontaneously converted back to normal sinus rhythm  on the date of admission.  Her INR was also noted to be subtherapeutic on  admission at 1.6.  She was placed on IV heparin.  She remained in normal  sinus rhythm over the next 36 hours and on 03/02/02 she was felt to be ready  for discharge to home.  At this point we will go ahead and weigh her with  Coumadin and go ahead resume her current Coumadin dose at home.   FOLLOW UP:  In the Coumadin clinic next week.   LABORATORY DATA:  Hemoglobin of 13.1, hematocrit 38.8, platelets 267.  TSH  is 2.814.  She was noted during her hospitalization to have some hypokalemia  at 3.2.  This will need to be rechecked in the office upon follow up.   DISCHARGE MEDICATIONS:  1. Atenolol 50 mg a day.  2. Ambien 5 mg q.h.s. p.r.n.  3. Coumadin as prior to admission.  4. Levoxyl 50 mcg 1 p.o. daily.  5. Celebrex 200 mg a day.  6. Ogen daily.  7. Provera daily.   DISCHARGE ACTIVITIES:  As tolerated.   DISCHARGE  DIET:  Low fat diet.   DISCHARGE INSTRUCTIONS:  She is to go to the Coumadin Clinic next week for  INR and to follow up with Dr. Stevphen Rochester in two or three weeks, also will call  for appointment time.    Guy Franco, P.A. [facility]                      Willa Rough, M.D. [facility]   LB/MEDQ  D:  03/02/2002  T:  03/02/2002  Job:  [id number]

## 2010-05-22 NOTE — H&P (Signed)
NAME:  [name], [name]                          ACCOUNT NO.:  [id number]   MEDICAL RECORD NO.:  [id number]                   PATIENT TYPE:  EMS   LOCATION:  MAJO                                 FACILITY:  [facility]   PHYSICIAN:  Doylene Canning. Ladona Ridgel, M.D. [facility]           DATE OF BIRTH:  Aug 01, 1932   DATE OF ADMISSION:  02/28/2002  DATE OF DISCHARGE:                                HISTORY & PHYSICAL   ADMITTING DIAGNOSIS:  Paroxysmal atrial fibrillation with rapid ventricular  response associated with chest pain.   PRIMARY CARE PHYSICIAN:  Delaney Meigs, M.D.   PRIMARY CARDIOLOGIST:  Willa Rough, M.D. [facility]   HISTORY OF PRESENT ILLNESS:  The patient is a very-pleasant 77 year old  woman with known paroxysmal atrial fibrillation  with last hospitalization  back in May of 2003.  Over the last several days, she has had increasing  frequency of her palpitations and today around 6:00 her tachypalpitations  worsened, and she had associated dull substernal chest pain.  There was no  shortness of breath, nausea, vomiting or diaphoresis associated with her  symptoms.  She did note some diffuse weakness.  The patient also notes that  she has been sleeping poorly and been frequently awakened at night time with  palpitations.  She took an extra atenolol at 50 mg at 6:30, but had no  change in her symptoms.  She took several baby aspirins and then presented  for evaluation.  She denies any history of frank syncope.  Her last  documented episode of atrial fibrillation with rapid rates was back in May  of 2003, though by her history, she probably has had short runs of  palpations and atrial fibrillation over the last several months.   PAST MEDICAL HISTORY:  Notable for hypertension.  She has a history of  allergic rhinitis.  She has a history of hypothyroidism and is on Levoxyl.  She also had a history of an outpatient stress test and echocardiogram one  year ago which were reportedly negative.  These  details were do not have  available at the present time.   MEDICATIONS ON ADMISSION:  1. Ambien.  2. Atenolol.  3. Warfarin.  4. Celebrex.  5. Levoxyl.  6. Ogen.  7. Provera.   SOCIAL HISTORY:  The patient lives in [place] and she lives alone.  She  denies tobacco or ethanol abuse.   FAMILY HISTORY:  Notable for mother without coronary artery disease.  Father  died at 58 of a massive myocardial infarction.  No siblings have coronary  artery disease.   REVIEW OF SYSTEMS:  Notable for no fevers, chills or night sweats.  She does  have worsening insomnia.  She denies vision or hearing problems.  She denies  any skin changes or skin rashes.  She does have chest pain with her  palpitations as times, particularly if they are long-lived.  She denies  shortness of breath  or dyspnea or exertion, PND or orthopnea.  She denies  claudication, cough or wheezing.  She denies dysuria or hematuria.  She  denies weakness, numbness or depression.  She denies nausea, vomiting,  diarrhea or constipation.  The rest of her review of systems is negative.   PHYSICAL EXAMINATION:  GENERAL:  She is a pleasant, well-appearing woman, in  no distress.  VITAL SIGNS:  Initial pulse was 140 and irregularly irregular with a blood  pressure of 169/100.  Respirations 20.  Temperature 98.  Her present pulse  is 76, and she has returned to sinus rhythm.  GENERAL:  She is a well-appearing middle-aged woman in no distress.  HEENT:  Exam is normocephalic, atraumatic.  Pupils are equal and round.  Oropharynx is moist.  Sclerae anicteric.  NECK:  Supple with no jugular venous distension.  CARDIOVASCULAR:  Exam revealed a regular rate and rhythm on my exam with  normal S1 and S2.  I did not appreciate a murmur.  LUNGS:  Clear bilaterally to auscultation.  There were no wheezes, rales or  rhonchi.  SKIN:  Revealed no rashes or lesions.  ABDOMEN:  Exam was soft, nontender, nondistended.  There was no  organomegaly.   EXTREMITIES:  Demonstrated no cyanosis, clubbing or edema.  NEUROLOGIC:  She was alert and oriented x3, with cranial nerves II-XII  grossly intact.   EKG initial demonstrates atrial fibrillation with rapid ventricular  response.  Repeat EKG demonstrates normal sinus rhythm.   IMPRESSION:  1. Paroxysmal atrial fibrillation with rapid ventricular response.  2. Chest pain associated with #1 with a history of negative Cardiolite     stress test.  3. Chronic Coumadin therapy.  4. History of hypothyroidism on Levoxyl.   PLAN:  1. Admit for observation after flecainide, on telemetry.  The patient has     returned to sinus rhythm.  2. Plan to check cardiac enzymes secondary to atypical chest pain.  3. Plan to discharge home in the a.m.  4. She will follow up with Dr. Myrtis Ser.  If atrial fibrillation returns, then     consideration for chronic flecainide therapy will be considered.  5. In addition, we will check her TSH.                                               Doylene Canning. Ladona Ridgel, M.D. [facility]    GWT/MEDQ  D:  02/28/2002  T:  03/01/2002  Job:  [id number]   cc:   Willa Rough, M.D. [facility]   Delaney Meigs, M.D.  [address]  [place]  [place] [address]  Fax: [phone]

## 2010-05-22 NOTE — Discharge Summary (Signed)
   NAME:  [name], [name]                          ACCOUNT NO.:  [id number]   MEDICAL RECORD NO.:  [id number]                   PATIENT TYPE:  INP   LOCATION:  4727                                 FACILITY:  [facility]   PHYSICIAN:  Guy Franco, P.A. [facility]                DATE OF BIRTH:  1932/12/14   DATE OF ADMISSION:  09/30/2002  DATE OF DISCHARGE:                           DISCHARGE SUMMARY - REFERRING   ADDENDUM:   FOLLOW UP:  Just wanted to confirm that the patient does have a follow-up  appointment with Dr. Willa Rough on October 18, 2002, at 10:15 a.m.                                                 Guy Franco, P.A. [facility]    LB/MEDQ  D:  10/01/2002  T:  10/01/2002  Job:  [id number]

## 2010-05-22 NOTE — Discharge Summary (Signed)
[facility]  Patient:    [name], [name]                       MRN: [id number] Adm. Date:  [id number] Disc. Date: [id number] Attending:  Talitha Givens Dictator:   Tereso Newcomer, P.A.                           Discharge Summary  DATE OF BIRTH:  [birth year].  DISCHARGE DIAGNOSES: 1. Paroxysmal atrial fibrillation/atrial flutter, successfully chemically    cardioverted with ibutilide. 2. Paroxysmal atrial fibrillation converted with flecainide x 1 dose in    December 2000. 3. Paroxysmal atrial fibrillation in 1985, first episode. 4. Hypertension. 5. Positive family history of coronary artery disease. 6. Hypothyroidism. 7. Lumbar arthritis, hand osteoarthritis. 8. Allergic rhinitis. 9. Apparent negative Cardiolyte per patient report in December 2000.  HISTORY OF PRESENT ILLNESS:  This 77 year old female patient of Dr. Myrtis Ser with history of PAF, last admitted in December 2000, was in her usual state of health until the week prior to admission.  She noted that she was somewhat weaker in the few days preceding admission.  Then on the evening prior to admission, she started having palpitations while dancing at about 10 p.m.  She usually takes Tenormin 25 mg daily.  She took one every two hours last evening after her palpitations started for a total of four at 100 mg.  She also took three regular aspirin.  She took another Tenormin the morning of admission. She denied any chest pain, shortness of breath, or diaphoresis, jaw or arm pain, nausea.  She did report some recent lower leg swelling.  PHYSICAL EXAMINATION:  GENERAL:  Admission physical showed a well-nourished, well-developed female in no acute distress.  VITAL SIGNS:  Blood pressure 100/60, pulse in the 120s-140s.  LUNGS:  Clear to auscultation.  HEART:  S1, S2, tachyarrhythmia.  No murmurs.  LABORATORY DATA:  EKG showed a heart rate of 141, atrial fibrillation/atrial flutter.   Sodium 142, potassium 4.5, chloride 110, CO2 26, BUN 15, creatinine 0.4, glucose 126.  Hemoglobin 12.6, hematocrit 38.2, WBC 5.8, platelet count 278.  The patients chest x-ray showed no active disease.  HOSPITAL COURSE:  The options of cardioversion were discussed with the patient.  She opted for chemical cardioversion with ibutilide.  She was given magnesium sulfate.  Her magnesium level was also checked.  This was found to be 2.0.  She also had TSH performed, and this was 3.981.  Cardiac enzymes:  CK 77, CK-MB 2.0, troponin I less than 0.03.  The patient was kept n.p.o.  She was successfully cardioverted that evening with ibutilide.  Discharge EKG showed a heart rate of 60, in sinus rhythm.  It was felt she was stable for discharge and was discharged to home that evening.  DISCHARGE MEDICATIONS: 1. Tenormin 25 mg p.o. q.a.m. and 12.5 mg q.p.m. 2. Levoxyl 0.075 mg q.d. 3. Provera. 4. Ogen. 5. Claritin 10 mg q.d. 6. Ambien 2.5 mg q.h.s. 7. Lodine as directed.  DISCHARGE INSTRUCTIONS:  The patient was to call the office in one week for follow-up with Dr. Myrtis Ser. DD:  04/01/99 TD:  04/01/99 Job: [id number]

## 2010-05-25 NOTE — H&P (Signed)
[name], [name]                ACCOUNT NO.:  [id number]  MEDICAL RECORD NO.:  [id number]           PATIENT TYPE:  O  LOCATION:  PADM                         FACILITY:  [facility]  PHYSICIAN:  Madlyn Frankel. Charlann Boxer, M.D.  DATE OF BIRTH:  10-04-1932  DATE OF ADMISSION:  05/20/2010 DATE OF DISCHARGE:                             HISTORY & PHYSICAL   DATE OF SURGERY:  May 26, 2010.  CHIEF COMPLAINT:  Right knee osteoarthritis.  PRESENT ILLNESS:  This is a 77 year old lady with a history of osteoarthritis of the right knee with failure of conservative treatment to alleviate her symptoms.  After discussion of treatment, benefits, risks and options, the patient is now scheduled for total knee arthroplasty of the right knee.  Note that the patient is not a candidate for tranexamic acid and will not receive that at surgery.  PAST MEDICAL HISTORY:  Drug allergy to PENICILLIN.  CURRENT MEDICATIONS: 1. Vitamin D 50,000 units capsule 1 by mouth weekly. 2. Coumadin 5 mg daily. 3. Tambocor 100 mg 2 daily. 4. Ambien 10 mg 1 at bedtime. 5. Colace 100 mg 1 b.i.d. 6. Oxycodone 5 mg 1 q.6 p.r.n. 7. Atenolol 50 mg 11 daily. 8. Furosemide 20 mg 1 daily. 9. Levothroid 50 mcg 1 daily. 10.Celebrex 200 mg 1 daily. 11.Meclizine 25 mg 1 p.o. p.r.n. 12.Crestor 10 mg daily. 13.Flexeril 10 mg p.o. daily p.r.n. 14.Loratadine 10 mg 1 daily.  SERIOUS MEDICAL ILLNESSES:  Include hypertension, atrial fibrillation, hypothyroidism, and osteoporosis.  PREVIOUS SURGERIES:  Include rotator cuff repair, left and right and also left shoulder total replacement.  FAMILY HISTORY:  Positive for coronary artery disease.  SOCIAL HISTORY:  The patient is married.  She lives at home.  She does not smoke and does not drink.  REVIEW OF SYSTEMS:  CENTRAL NERVOUS SYSTEM:  Positive for occasional dizziness and balance problems.  PULMONARY:  Negative for shortness breath, PND, or orthopnea.  CARDIOVASCULAR:  Negative for  chest pain or palpitation.  Positive for history of atrial fibrillation.  GI: Negative for ulcers, hepatitis.  GU:  Positive for frequency. MUSCULOSKELETAL:  As positive in HPI.  PHYSICAL EXAMINATION:  VITAL SIGNS:  BP 180/80, respirations 12, pulse 64 and regular. GENERAL APPEARANCE:  This is a well-developed, well-nourished lady, in no acute distress. HEENT:  Head normocephalic.  Nose patent.  Ears patent.  Pupils equal, round, and reactive to light.  Throat without injection. NECK:  Supple without adenopathy.  Carotids are 2+ without bruits. CHEST:  Clear to auscultation.  No rales or rhonchi.  Respirations 12, pulse 64 beats per minute without murmur. ABDOMEN:  Soft with active bowel sounds.  No masses, organomegaly. NEUROLOGIC:  The patient is alert and oriented to time, place, and person  Cranial nerves II-XII grossly intact. EXTREMITIES:  Show the right knee has 0-120 degrees range of motion. Neurovascular status intact.  IMPRESSION:  Right knee osteoarthritis.  PLAN:  Total knee arthroplasty, right knee.  She will stop her Coumadin 5 days before surgery and has had clearance for that by Dr. Myrtis Ser, her medical doctor.     Jaquelyn Bitter. Chabon, P.A.  ______________________________ Madlyn Frankel Charlann Boxer, M.D.    SJC/MEDQ  D:  05/20/2010  T:  05/21/2010  Job:  [id number]  Electronically Signed by Jodene Nam P.A. on 05/25/2010 12:40:12 PM Electronically Signed by Durene Romans M.D. on 05/25/2010 01:02:00 PM

## 2010-05-26 ENCOUNTER — Inpatient Hospital Stay (HOSPITAL_COMMUNITY)
Admission: RE | Admit: 2010-05-26 | Discharge: 2010-05-29 | DRG: 470 | Disposition: A | Discharge status: Home Health | Payer: Medicare Other | Source: Ambulatory Visit | Attending: Orthopedic Surgery | Admitting: Orthopedic Surgery

## 2010-05-26 DIAGNOSIS — E039 Hypothyroidism, unspecified: Secondary | ICD-10-CM | POA: Diagnosis present

## 2010-05-26 DIAGNOSIS — Z7901 Long term (current) use of anticoagulants: Secondary | ICD-10-CM

## 2010-05-26 DIAGNOSIS — M1710 Unilateral primary osteoarthritis, unspecified knee: Principal | ICD-10-CM | POA: Diagnosis present

## 2010-05-26 DIAGNOSIS — M810 Age-related osteoporosis without current pathological fracture: Secondary | ICD-10-CM | POA: Diagnosis present

## 2010-05-26 DIAGNOSIS — I4891 Unspecified atrial fibrillation: Secondary | ICD-10-CM | POA: Diagnosis present

## 2010-05-26 DIAGNOSIS — Z01812 Encounter for preprocedural laboratory examination: Secondary | ICD-10-CM

## 2010-05-26 DIAGNOSIS — I10 Essential (primary) hypertension: Secondary | ICD-10-CM | POA: Diagnosis present

## 2010-05-26 HISTORY — PX: TOTAL KNEE ARTHROPLASTY: SHX125

## 2010-05-26 LAB — TYPE AND SCREEN
ABO/RH(D): A POS
Antibody Screen: NEGATIVE

## 2010-05-26 LAB — APTT: aPTT: 30 seconds (ref 24–37)

## 2010-05-27 LAB — BASIC METABOLIC PANEL
CO2: 29 mEq/L (ref 19–32)
Calcium: 7.7 mg/dL — ABNORMAL LOW (ref 8.4–10.5)
GFR calc Af Amer: >60 mL/min (ref >60)
Potassium: 3.8 mEq/L (ref 3.5–5.1)
Sodium: 138 mEq/L (ref 135–145)

## 2010-05-27 LAB — CBC
Hemoglobin: 8.3 g/dL — ABNORMAL LOW (ref 12.0–15.0)
MCH: 24.6 pg — ABNORMAL LOW (ref 26.0–34.0)
RBC: 3.38 MIL/uL — ABNORMAL LOW (ref 3.87–5.11)
WBC: 8.9 10*3/uL (ref 4.0–10.5)

## 2010-05-28 LAB — PROTIME-INR
INR: 1.26 (ref 0.00–1.49)
Prothrombin Time: 16 seconds — ABNORMAL HIGH (ref 11.6–15.2)

## 2010-05-28 LAB — CBC
HCT: 26.3 % — ABNORMAL LOW (ref 36.0–46.0)
Hemoglobin: 8.5 g/dL — ABNORMAL LOW (ref 12.0–15.0)
MCH: 25.4 pg — ABNORMAL LOW (ref 26.0–34.0)
RBC: 3.35 MIL/uL — ABNORMAL LOW (ref 3.87–5.11)

## 2010-05-28 LAB — BASIC METABOLIC PANEL
GFR calc non Af Amer: >60 mL/min (ref >60)
Potassium: 3.7 mEq/L (ref 3.5–5.1)
Sodium: 126 mEq/L — ABNORMAL LOW (ref 135–145)

## 2010-05-29 LAB — PROTIME-INR: Prothrombin Time: 17.8 seconds — ABNORMAL HIGH (ref 11.6–15.2)

## 2010-06-02 NOTE — Discharge Summary (Signed)
NAMEKISA, FUJII                ACCOUNT NO.:  [id number]  MEDICAL RECORD NO.:  [id number]           PATIENT TYPE:  I  LOCATION:  1612                         FACILITY:  [facility]  PHYSICIAN:  Madlyn Frankel. Charlann Boxer, M.D.  DATE OF BIRTH:  07-01-1932  DATE OF ADMISSION:  05/26/2010 DATE OF DISCHARGE:  05/29/2010                              DISCHARGE SUMMARY   ADMITTING DIAGNOSIS:  Right knee osteoarthritis.  DISCHARGE DIAGNOSES: 1. Right knee osteoarthritis. 2. Hypertension. 3. Atrial fibrillation, on requiring anticoagulation with Coumadin. 4. Hypothyroidism. 5. Diagnosis osteoporosis.  ADMITTING HISTORY:  [name] is a 77 year old female who presented to the office with advanced bilateral knee osteoarthritis, right more significant than the left.  She had failed conservative measures and has significant reduction in quality of life.  After reviewing risks and benefits in the office setting, she wished at this point to proceed with knee arthroplasty.  Consent was obtained in the office setting.  HOSPITAL COURSE:  The patient was admitted for same-day surgery on May 26, 2010.  She underwent a right total knee replaced without complications.  Please see dictated operative note for full details of the procedure.  Postoperatively, after routine stay in the recovery room, she was transferred to orthopedic ward where she remained for hospital stay.  Postop day #1, she had her Hemovac removed, Foley catheter was removed.  She was seen and evaluated by Physical Therapy, weightbearing as tolerated.  Due to discomfort, her activity was somewhat limited.  Postoperative day #1, she was noted to have a hematocrit of 27.1 with stable electrolytes.  Her wound remained dry.  On postoperative day #2, she was noted to have hematocrit of 26.3, thus stable with stable platelets and electrolytes again remained within normal limits.  Her INR perioperatively was 1.45, on day of discharge, day #3  on Coumadin returning to her home dosages.  The patient did have some bouts with some nausea, but this improved by postop day #1, tolerating diet.  Arrangements were made for home health physical therapy.  DISCHARGE INSTRUCTIONS:  The patient is to be discharged home with home health physical therapy.  They will work on range of motion, strengthening, gait training exercises were provided from the hospital as throughout the patient's home therapy may be delayed due to outpatient services.  She will return to see Dr. Durene Romans at [facility], [phone], in 2 weeks' time, any questions can be addressed to [place] as well.  Her condition at time of discharge is stable.  DISCHARGE MEDICATIONS: 1. Colace 100 mg p.o. b.i.d. for constipation while on pain medicine. 2. MiraLax 17 g p.o. daily as needed for constipation while on pain     medicine. 3. Aspirin 325 mg p.o. b.i.d. for 30 days. 4. Robaxin 500 mg every 6 hours as needed for muscle spasm and pain. 5. Norco 7.5/325 one to two tablets every 4-6 hours as needed for     pain. 6. Ambien 5 mg nightly as needed. 7. Aspirin 81 mg to be resumed after the 30 days of b.i.d. dosing of     325 mg. 8. Atenolol 25 mg  b.i.d. 9. Celebrex 200 mg q.a.m. as needed. 10.Loratadine 10 mg daily. 11.Warfarin 5 mg q.a.m. 12.Crestor 5 mg nightly. 13.Flexeril as needed. 14.Iron as needed over the counter. 15.Lasix 20 mg q.a.m. 16.Meclizine 25 mg half tablet t.i.d. as needed. 17.Synthroid 50 mcg q.a.m. 18.Flecainide 100 mg b.i.d. 19.Vitamin D2 50,000 units every Sunday.     Madlyn Frankel Charlann Boxer, M.D.     MDO/MEDQ  D:  05/29/2010  T:  05/30/2010  Job:  [id number]  Electronically Signed by Durene Romans M.D. on 06/02/2010 08:56:05 AM

## 2010-06-02 NOTE — Op Note (Signed)
[name], [name]                ACCOUNT NO.:  [id number]  MEDICAL RECORD NO.:  [id number]           PATIENT TYPE:  I  LOCATION:  1612                         FACILITY:  [facility]  PHYSICIAN:  Madlyn Frankel. Charlann Boxer, M.D.  DATE OF BIRTH:  [birth year]  DATE OF PROCEDURE:  05/26/2010 DATE OF DISCHARGE:                              OPERATIVE REPORT   PREOPERATIVE DIAGNOSIS:  Right knee osteoarthritis with severe degenerative changes with lateral predominance.  POSTOPERATIVE DIAGNOSIS:  Right knee osteoarthritis with severe degenerative changes with lateral predominance.  PROCEDURE:  Right total knee replacement.  COMPONENTS USED:  DePuy rotating platform posterior stabilized knee system with size 3 femur, 2.5 tibia, 12.5 insert and 35 patellar button.  SURGEON:  Madlyn Frankel. Charlann Boxer, M.D.  ASSISTANT:  Jaquelyn Bitter. Chabon, P.A.  ANESTHESIA:  Spinal.  SPECIMENS:  None.  COMPLICATIONS:  None.  DRAINS:  One Hemovac.  TOURNIQUET TIME:  34 minutes at 250 mmHg.  INDICATIONS FOR PROCEDURE:  [name] is a 77 year old female who presented to the office for advanced bilateral knee osteoarthritis, right greater than left.  Radiographic changes were worse on the right. She failed conservative measures and at this point was ready to proceed with more definitive measures.  Risks and benefits of knee replacement surgery were discussed including infection, DVT, component failure and need for revision surgery.  Consent was obtained for the benefit of pain relief.  PROCEDURE IN DETAIL:  The patient was brought to operative theater. Once adequate anesthesia, preoperative antibiotics, Ancef administered, the patient was positioned supine with the right thigh tourniquet placed.  The right lower extremity was then prepped and draped in sterile fashion with the right foot placed in DeMayo leg holder.  Time- out was performed identifying the patient, planned procedure and extremity.  Leg was exsanguinated,  tourniquet elevated to 250 mmHg.  A midline incision was made followed by median parapatellar arthrotomy with minimal stripping proximal medial due to the valgus tension in the knee, some exposure to the lateral soft tissues and releasing of the proximal lateral tibia was carried out.  Attention was first directed to patella.  Precut measurement at this point was noted to be 22 mm.  I resected down to about 14 mm and used a 35 patellar button to cover the cut surface as well as restore patellar height.  The lug holes were drilled and a metal shim placed to protect the patella from retracting saw blade.  Attention was now directed to the femur.  Femoral canal was opened with drill, irrigated to try to prevent fat emboli.  Intramedullary rod was passed at 30 degrees of valgus, 10 mm of bone resected off the distal femur.  Attention was now directed to the tibia.  The tibia was subluxated anteriorly.  Further meniscus and cruciate stumps debrided.  Using extramedullary guide, a measured resection based up the proximal lateral tibia of 4 mm carried out.  Following this resection, we confirmed the gap was going to be stable in the medial and lateral ligament with a 10 mm insert.  We then confirmed the cut was perpendicular in the coronal plane.  Once this was done in satisfactory condition, attention was now directed back to the femur.  Femoral sizing in anterior-posterior dimension was a size 3, almost a size 4 medially and laterally, I felt narrow, so I went to size 3.  The size 3 rotation block was then pinned into position, anterior reference using a C clamp set rotation.  The 4- in-1 cutting block was then pinned into position.  Anterior-posterior chamfer cuts were then made without difficulty nor notching.  Final box cut was then made off the lateral aspect of distal femur.  The tibia was then subluxated anteriorly.  The cut surface of tibia was seen to be best fit with a size 2.5  tibial tray, it was pinned through the medial third of tubercle, drilled and keel punched.  A trial reduction was now carried out with size 3 femur despite the fact it was adequate in an anterior-posterior dimension, but still had a little bit overhang medially but I felt this was acceptable, it was not going to be able to downsize to 2 or 2.5 due to flexion gap issues.  Trial reduction initially with a 10 mm insert but I went to 12.5 insert, find this was more stable from extension and flexion with a balance symmetry.  The patella tracked through the trochlea without application of pressure.  Given all these findings, the trial components removed, the final components were opened and cement was mixed.  The synovial capsule junction was injected with 0.25% Marcaine with epinephrine, 1 cc of Toradol for total 61 cc.  The knee was irrigated with normal saline solution and pulse lavage.  The final components were then cemented on to clean and dried cut surfaces of bone with the knee brought to extension with a 12.5 insert. Extruded cement was removed.  Once the cement had fully cured, excessive cement was removed throughout the knee and final 12.5 insert was chosen and placed into the joint.  The knee tourniquet then let down after 34 minutes without significant hemostasis required.  A medium Hemovac drain was placed deep.  The extensor mechanism was then reapproximated with the knee in flexion using #1 Vicryl.  The remainder of the wound was closed with 2-0 Vicryl in subcu layer, running 4-0 Monocryl.  The knee wound was then cleaned, dried, and dressed sterilely with Dermabond and Aquacel dressing.  Drain site dressed separately.  Her knee was then dressed into a sterile Ace wrap. She was then brought to recovery room in stable condition tolerating the procedure well.     Madlyn Frankel Charlann Boxer, M.D.     MDO/MEDQ  D:  05/26/2010  T:  05/26/2010  Job:  [id number]  Electronically Signed  by Durene Romans M.D. on 06/02/2010 08:54:20 AM

## 2010-09-22 ENCOUNTER — Encounter: Payer: Self-pay | Admitting: Cardiology

## 2010-09-22 DIAGNOSIS — R0989 Other specified symptoms and signs involving the circulatory and respiratory systems: Secondary | ICD-10-CM | POA: Insufficient documentation

## 2010-09-22 DIAGNOSIS — I4891 Unspecified atrial fibrillation: Secondary | ICD-10-CM | POA: Insufficient documentation

## 2010-09-22 DIAGNOSIS — Z79899 Other long term (current) drug therapy: Secondary | ICD-10-CM | POA: Insufficient documentation

## 2010-09-22 DIAGNOSIS — Z96659 Presence of unspecified artificial knee joint: Secondary | ICD-10-CM | POA: Insufficient documentation

## 2010-09-22 DIAGNOSIS — Z7901 Long term (current) use of anticoagulants: Secondary | ICD-10-CM | POA: Insufficient documentation

## 2010-09-22 DIAGNOSIS — E039 Hypothyroidism, unspecified: Secondary | ICD-10-CM | POA: Insufficient documentation

## 2010-09-22 DIAGNOSIS — IMO0001 Reserved for inherently not codable concepts without codable children: Secondary | ICD-10-CM | POA: Insufficient documentation

## 2010-09-22 DIAGNOSIS — R9430 Abnormal result of cardiovascular function study, unspecified: Secondary | ICD-10-CM | POA: Insufficient documentation

## 2010-09-23 ENCOUNTER — Ambulatory Visit (INDEPENDENT_AMBULATORY_CARE_PROVIDER_SITE_OTHER): Payer: Medicare Other | Admitting: Cardiology

## 2010-09-23 ENCOUNTER — Encounter: Payer: Self-pay | Admitting: Cardiology

## 2010-09-23 DIAGNOSIS — Z5189 Encounter for other specified aftercare: Secondary | ICD-10-CM

## 2010-09-23 DIAGNOSIS — R0989 Other specified symptoms and signs involving the circulatory and respiratory systems: Secondary | ICD-10-CM

## 2010-09-23 DIAGNOSIS — M79669 Pain in unspecified lower leg: Secondary | ICD-10-CM

## 2010-09-23 DIAGNOSIS — Z01810 Encounter for preprocedural cardiovascular examination: Secondary | ICD-10-CM

## 2010-09-23 DIAGNOSIS — Z7901 Long term (current) use of anticoagulants: Secondary | ICD-10-CM

## 2010-09-23 DIAGNOSIS — I4891 Unspecified atrial fibrillation: Secondary | ICD-10-CM

## 2010-09-23 DIAGNOSIS — Z79899 Other long term (current) drug therapy: Secondary | ICD-10-CM

## 2010-09-23 DIAGNOSIS — R9430 Abnormal result of cardiovascular function study, unspecified: Secondary | ICD-10-CM

## 2010-09-23 DIAGNOSIS — M79609 Pain in unspecified limb: Secondary | ICD-10-CM

## 2010-09-23 DIAGNOSIS — Z96659 Presence of unspecified artificial knee joint: Secondary | ICD-10-CM | POA: Insufficient documentation

## 2010-09-23 NOTE — Assessment & Plan Note (Signed)
The patient historically has a normal ejection fraction.  Her last echo was in 2003.  She does not need repeat at this time.

## 2010-09-23 NOTE — Assessment & Plan Note (Signed)
Patient is considering surgery for her left knee.  If she decides to go ahead she can be cleared to do this.

## 2010-09-23 NOTE — Patient Instructions (Signed)
Your physician recommends that you return for lab work for a Flecainide blood level.   Please call our office when an early morning day would be good for you.    Please do NOT take your Flecainide before having your blood drawn that morning.   You may take your Flecainide AFTER you have had your blood drawn.  We will call you with your lab results.  Your physician recommends that you schedule a follow-up appointment in: 1 year with Dr. Myrtis Ser

## 2010-09-23 NOTE — Assessment & Plan Note (Signed)
Patient is not having any significant recurrent atrial fibrillation.  She will be kept on flecainide.  We will obtain a level.

## 2010-09-23 NOTE — Progress Notes (Signed)
HPI Patient is seen in followup atrial fibrillation.  She is very stable and her flecainide.  She is on Coumadin.  She has only rare palpitations.  Overall she done well.  Today she mentions a new issue.  She has some mild discomfort in her calf and tingling in her feet.  This occurred at rest.  She is curious to know if this could be peripheral arterial disease.  She does not have symptoms with walking. Allergies  Allergen Reactions  . Penicillins     Current Outpatient Prescriptions  Medication Sig Dispense Refill  . aspirin 81 MG tablet Take 81 mg by mouth daily.        Marland Kitchen atenolol (TENORMIN) 50 MG tablet Take 50 mg by mouth daily.        . celecoxib (CELEBREX) 200 MG capsule Take 200 mg by mouth as needed.        . CRESTOR 10 MG tablet Take 0.5 tablets by mouth Daily.      . ergocalciferol (VITAMIN D2) 50000 UNITS capsule Take 50,000 Units by mouth once a week.        . flecainide (TAMBOCOR) 100 MG tablet Take 100 mg by mouth 2 (two) times daily.        . furosemide (LASIX) 20 MG tablet Take 20 mg by mouth daily.        Marland Kitchen HYDROcodone-acetaminophen (VICODIN) 5-500 MG per tablet Take 1 tablet by mouth every 6 (six) hours as needed.        Marland Kitchen levothyroxine (SYNTHROID, LEVOTHROID) 50 MCG tablet Take 50 mcg by mouth daily.        . meclizine (ANTIVERT) 25 MG tablet Take 25 mg by mouth daily as needed.        . warfarin (COUMADIN) 5 MG tablet Take 5 mg by mouth as directed.        . zolpidem (AMBIEN) 10 MG tablet Take 1 tablet by mouth At bedtime as needed.        History   Social History  . Marital Status: Married    Spouse Name: N/A    Number of Children: N/A  . Years of Education: N/A   Occupational History  . Not on file.   Social History Main Topics  . Smoking status: Never Smoker   . Smokeless tobacco: Not on file  . Alcohol Use: No  . Drug Use: No  . Sexually Active: Not on file   Other Topics Concern  . Not on file   Social History Narrative  . No narrative on file      Family History  Problem Relation Age of Onset  . Coronary artery disease Brother   . Liver cancer Brother   . Colon cancer Brother     Past Medical History  Diagnosis Date  . Atrial fibrillation     Paroxysmal, Flecainide therapy  . Warfarin anticoagulation   . Drug therapy     Flecainide for atrial fibrillation  . Hypothyroidism   . Carotid bruit     Doppler, December, 2009, no abnormality  . Ejection fraction     EF normal, echo, 2003  . Normal nuclear stress test     Normal, 2001  . Diverticulosis   . HLD (hyperlipidemia)   . HTN (hypertension)   . Osteoarthritis     Past Surgical History  Procedure Date  . Total knee arthroplasty     Total right knee, leg, 2012, Dr. Charlann Boxer  . Shoulder surgery  left    ROS  Patient denies fever, chills, headache, sweats, rash, change in vision, change in hearing, chest pain, cough, nausea vomiting, urinary symptoms.  All other systems are reviewed and are negative.  PHYSICAL EXAM Patient is stable.  She is oriented to person time and place.  Affect is normal.  Head is atraumatic.  There is no jugular venous distention.  No carotid bruits.  Lungs are clear.  Respiratory effort is nonlabored.  Cardiac exam reveals S1-S2.  No clicks or significant murmurs.  The abdomen is soft.  There is no peripheral edema.  There are no musculoskeletal deformities.  The patient has 2+ bilateral posterior tibial pulses and 1+ bilateral dorsalis pedis pulses. Filed Vitals:   09/23/10 1033  BP: 122/78  Pulse: 56  Height: 5\' 3"  (1.6 m)  Weight: 153 lb (69.4 kg)    EKG EKG is done today and reviewed by me.  There is mild decreased anterior R-wave.  There is no significant change. ASSESSMENT & PLAN

## 2010-09-23 NOTE — Assessment & Plan Note (Signed)
Flecainide Will be continued..  We will draw a trough level

## 2010-09-23 NOTE — Assessment & Plan Note (Signed)
Patient will remain on warfarin anticoagulation.  This medicine can be held 5-7 days before hospital knee surgery.

## 2010-09-23 NOTE — Assessment & Plan Note (Signed)
Patient mentions that she has some calf discomfort and tingling in her feet at rest.  She does not have exertional symptoms.  Her pulses are good.  I feel that further vascular testing is not needed.

## 2010-10-07 ENCOUNTER — Other Ambulatory Visit: Payer: Medicare Other | Admitting: *Deleted

## 2010-10-07 DIAGNOSIS — Z79899 Other long term (current) drug therapy: Secondary | ICD-10-CM

## 2010-10-14 LAB — FLECAINIDE LEVEL

## 2010-10-16 ENCOUNTER — Telehealth: Payer: Self-pay | Admitting: Cardiology

## 2010-10-16 NOTE — Telephone Encounter (Signed)
Returning call back to nurse.  

## 2010-10-16 NOTE — Progress Notes (Signed)
Pt aware of results Debbie Markie Frith RN  

## 2010-10-16 NOTE — Telephone Encounter (Signed)
Spoke with pt and gave her results of flecainide level and Dr. Henrietta Hoover comments.

## 2010-10-16 NOTE — Progress Notes (Signed)
LMTCB Debbie Jamicheal Heard RN  

## 2010-11-05 ENCOUNTER — Telehealth: Payer: Self-pay | Admitting: Cardiology

## 2010-11-05 NOTE — Telephone Encounter (Signed)
All Cardiac faxed to [facility] @ [phone]  11/05/10/km °

## 2010-11-06 ENCOUNTER — Encounter (HOSPITAL_COMMUNITY): Payer: Self-pay | Admitting: Pharmacy Technician

## 2010-11-09 ENCOUNTER — Encounter (HOSPITAL_COMMUNITY): Payer: Self-pay

## 2010-11-09 ENCOUNTER — Encounter (HOSPITAL_COMMUNITY): Payer: Medicare Other

## 2010-11-09 LAB — CBC
HCT: 33.9 % — ABNORMAL LOW (ref 36.0–46.0)
MCHC: 32.7 g/dL (ref 30.0–36.0)
MCV: 82.9 fL (ref 78.0–100.0)
Platelets: 344 10*3/uL (ref 150–400)
RDW: 16.5 % — ABNORMAL HIGH (ref 11.5–15.5)

## 2010-11-09 LAB — DIFFERENTIAL
Basophils Absolute: 0.1 10*3/uL (ref 0.0–0.1)
Basophils Relative: 1 % (ref 0–1)
Eosinophils Relative: 3 % (ref 0–5)
Monocytes Absolute: 0.6 10*3/uL (ref 0.1–1.0)

## 2010-11-09 LAB — URINALYSIS, ROUTINE W REFLEX MICROSCOPIC
Bilirubin Urine: NEGATIVE
Glucose, UA: NEGATIVE mg/dL
Hgb urine dipstick: NEGATIVE
Specific Gravity, Urine: 1.007 (ref 1.005–1.030)
pH: 7 (ref 5.0–8.0)

## 2010-11-09 LAB — BASIC METABOLIC PANEL
BUN: 11 mg/dL (ref 6–23)
CO2: 29 mEq/L (ref 19–32)
Calcium: 9.1 mg/dL (ref 8.4–10.5)
Chloride: 99 mEq/L (ref 96–112)
Creatinine, Ser: 0.65 mg/dL (ref 0.50–1.10)
GFR calc Af Amer: >90 mL/min (ref >90)

## 2010-11-09 MED ORDER — CHLORHEXIDINE GLUCONATE 4 % EX LIQD: 60.0000 mL | Freq: Once | CUTANEOUS | Status: DC

## 2010-11-14 NOTE — H&P (Signed)
HISTORY & PHYSICAL         DATE OF SURGERY:  November 17, 2010.      CHIEF COMPLAINT:  Left knee osteoarthritis/pain.      HISTORY OF PRESENT ILLNESS:  The patient is a 77 year old white female in no acute distress.  The patient states that she has had left knee pain for a couple of years.  She has had a previous right total knee arthroplasty on May 26, 2010.  She has done well since having this done. The patient does state, however, that her left knee pain has increased since that time.  The patient feels that she has pain and most of the time feels as though it is going to give away on her.  The patient has been taking Celebrex, which does help the pain somewhat.  The patient has also had steroid injections, which has helped, but the pain quickly returned.  X-rays in the clinic did show significant osteoarthritic changes of the left knee.  Various options were discussed with the patient.  The patient wished to proceed with surgery.  Risks, benefits, and expectations of the procedure were discussed with the patient.  The  patient understands the risks, benefits, and expectations, and wishes to proceed with surgery.      The patient on discharge is planning on going home as she did last time with home health, physical therapy.  The patient has been given postoperative medications of Robaxin, iron, Colace, and MiraLax.  The patient is not a candidate for tranexamic acid and will not be given  this prior to surgery.      PRIMARY CARE PHYSICIAN:  Delaney Meigs, M.D. at [facility].      PAST MEDICAL HISTORY:   1. Hypertension.   2. Hypercholesteremia.   3. Heart disease/coronary disease.   4. AFib.   5. Thyroid disease.   6. Degenerative disk disease.      PAST SURGICAL HISTORY:   1. Rotator cuff on the left.   2. Rotator cuff on the right.   3. Shoulder replacement on the left.   4. Right total knee replacement.      MEDICATIONS:   1. Celebrex 200 mg 1 p.o. q.  day.   2. Crestor 5 mg 1 p.o. q. day.   3. Flecainide 100 mg 1 p.o. b.i.d.   4. Atenolol 25 mg 1 p.o. b.i.d.   5. Zolpidem 10 mg 1 p.o. q. day.   6. Levothyroxine 50 mcg 1 p.o. q. day.   7. Furosemide 20 mg 1 p.o. q. day.   8. Coumadin 5 mg 1 p.o. q. day.   9. Vitamin D 50,000 units 1 p.o. q. week.   10.Meclizine 25 mg p.r.n.   11.Aspirin 81 mg 1 p.o. q. day.   12.Warfarin (stop 5-7 days prior to surgery).      ALLERGIES:  The patient does state PENICILLIN causes a rash.     REVIEW OF SYSTEMS:   GENERAL:  She does complain of some fatigue.   HEENT:  Dizziness, hearing loss, weakness, insomnia, balance problems, dentures.  GI: Heartburn.   GU:  Urinary frequency, urinating at night.  MUSCULOSKELETAL:  Joint pain and morning stiffness.  Otherwise unremarkable.     PHYSICAL EXAMINATION:   GENERAL:  The patient is a 77 year old white female in no acute distress.   VITAL SIGNS:  Stable.  Blood pressure in  the right arm is 132/78, pulse 74, respirations 16.  HEENT:  Pupils equal, round, and  react well to light and accommodation. Throat is clear.  NECK:  Supple.  No JVD noted.  No carotid bruits.  No lymphadenopathy noted.  CARDIAC:  Normal-appearing S1, S2.  No murmur appreciated.   RESPIRATORY:  Lungs are clear to auscultation bilaterally.   NEURO:  The patient is oriented x3.   ORTHO: As pertaining to the left knee, the patient has generalized left knee pain.  The patient has full range of motion with some increase in pain.  Patient is distally and neurovascularly intact.  The patient has +2 dorsalis pedis pulse.  There  is no instability noted in the left knee. Surgical site is clear.  The patient does have one varicosity noted of the patella.      IMPRESSION:  Left knee osteoarthritis/pain.     STUDIES:  X-ray as above.     PLAN:  The patient was admitted to the hospital to undergo left total knee arthroplasty per Dr. Charlann Boxer at [facility] on November 17, 2010.  Risks,  benefits, and expectations of the procedure were discussed with the patient.  The patient understands the risks, benefits, and  expectations and wishes to proceed with surgery.

## 2010-11-17 ENCOUNTER — Inpatient Hospital Stay (HOSPITAL_COMMUNITY): Payer: Medicare Other | Admitting: Anesthesiology

## 2010-11-17 ENCOUNTER — Encounter (HOSPITAL_COMMUNITY)
Admission: RE | Disposition: A | Discharge status: Home Health | Payer: Self-pay | Source: Ambulatory Visit | Attending: Orthopedic Surgery

## 2010-11-17 ENCOUNTER — Inpatient Hospital Stay (HOSPITAL_COMMUNITY)
Admission: RE | Admit: 2010-11-17 | Discharge: 2010-11-20 | DRG: 470 | Disposition: A | Discharge status: Home Health | Payer: Medicare Other | Source: Ambulatory Visit | Attending: Orthopedic Surgery | Admitting: Orthopedic Surgery

## 2010-11-17 ENCOUNTER — Encounter (HOSPITAL_COMMUNITY): Payer: Self-pay

## 2010-11-17 ENCOUNTER — Encounter (HOSPITAL_COMMUNITY): Payer: Self-pay | Admitting: Anesthesiology

## 2010-11-17 DIAGNOSIS — E7800 Pure hypercholesterolemia, unspecified: Secondary | ICD-10-CM | POA: Diagnosis present

## 2010-11-17 DIAGNOSIS — I2510 Atherosclerotic heart disease of native coronary artery without angina pectoris: Secondary | ICD-10-CM | POA: Diagnosis present

## 2010-11-17 DIAGNOSIS — Z96659 Presence of unspecified artificial knee joint: Secondary | ICD-10-CM

## 2010-11-17 DIAGNOSIS — M1710 Unilateral primary osteoarthritis, unspecified knee: Principal | ICD-10-CM | POA: Diagnosis present

## 2010-11-17 DIAGNOSIS — IMO0002 Reserved for concepts with insufficient information to code with codable children: Secondary | ICD-10-CM | POA: Diagnosis present

## 2010-11-17 DIAGNOSIS — I4891 Unspecified atrial fibrillation: Secondary | ICD-10-CM | POA: Diagnosis present

## 2010-11-17 DIAGNOSIS — E079 Disorder of thyroid, unspecified: Secondary | ICD-10-CM | POA: Diagnosis present

## 2010-11-17 DIAGNOSIS — I10 Essential (primary) hypertension: Secondary | ICD-10-CM | POA: Diagnosis present

## 2010-11-17 HISTORY — PX: TOTAL KNEE ARTHROPLASTY: SHX125

## 2010-11-17 LAB — APTT: aPTT: 30 seconds (ref 24–37)

## 2010-11-17 SURGERY — ARTHROPLASTY, KNEE, TOTAL
Anesthesia: Spinal | Site: Knee | Laterality: Left | Wound class: Clean

## 2010-11-17 MED ORDER — ALUM & MAG HYDROXIDE-SIMETH 200-200-20 MG/5ML PO SUSP: 30.0000 mL | ORAL | Status: DC | PRN

## 2010-11-17 MED ORDER — HYDROCODONE-ACETAMINOPHEN 7.5-325 MG PO TABS
1.0000 | ORAL_TABLET | ORAL | Status: DC | PRN
  Administered 2010-11-17 – 2010-11-18 (×4): 1 via ORAL
  Administered 2010-11-18 (×4): 2 via ORAL
  Administered 2010-11-19 – 2010-11-20 (×4): 1 via ORAL
  Filled 2010-11-17 (×4): qty 1
  Filled 2010-11-17: qty 2
  Filled 2010-11-17 (×4): qty 1
  Filled 2010-11-17 (×3): qty 2

## 2010-11-17 MED ORDER — ACETAMINOPHEN 10 MG/ML IV SOLN
INTRAVENOUS | Status: DC | PRN
  Administered 2010-11-17: 1000 mg via INTRAVENOUS

## 2010-11-17 MED ORDER — MECLIZINE HCL 25 MG PO TABS
25.0000 mg | ORAL_TABLET | Freq: Every day | ORAL | Status: DC | PRN
  Filled 2010-11-17: qty 1

## 2010-11-17 MED ORDER — MIDAZOLAM HCL 5 MG/5ML IJ SOLN
INTRAMUSCULAR | Status: DC | PRN
  Administered 2010-11-17: 2 mg via INTRAVENOUS

## 2010-11-17 MED ORDER — METHOCARBAMOL 500 MG PO TABS
500.0000 mg | ORAL_TABLET | Freq: Four times a day (QID) | ORAL | Status: DC | PRN
  Administered 2010-11-17 – 2010-11-18 (×2): 500 mg via ORAL
  Filled 2010-11-17 (×3): qty 1

## 2010-11-17 MED ORDER — FERROUS SULFATE 325 (65 FE) MG PO TABS
325.0000 mg | ORAL_TABLET | Freq: Three times a day (TID) | ORAL | Status: DC
  Administered 2010-11-18 – 2010-11-19 (×5): 325 mg via ORAL
  Filled 2010-11-17 (×10): qty 1

## 2010-11-17 MED ORDER — CEFAZOLIN SODIUM 1-5 GM-% IV SOLN
INTRAVENOUS | Status: DC | PRN
  Administered 2010-11-17: 1 g via INTRAVENOUS

## 2010-11-17 MED ORDER — LORATADINE 10 MG PO TABS
10.0000 mg | ORAL_TABLET | Freq: Every day | ORAL | Status: DC
  Administered 2010-11-18 – 2010-11-20 (×3): 10 mg via ORAL
  Filled 2010-11-17 (×4): qty 1

## 2010-11-17 MED ORDER — METHOCARBAMOL 100 MG/ML IJ SOLN
500.0000 mg | Freq: Four times a day (QID) | INTRAVENOUS | Status: DC | PRN
  Filled 2010-11-17: qty 5

## 2010-11-17 MED ORDER — HYDROMORPHONE HCL PF 1 MG/ML IJ SOLN
0.2500 mg | INTRAMUSCULAR | Status: DC | PRN
  Administered 2010-11-17: 0.5 mg via INTRAVENOUS

## 2010-11-17 MED ORDER — FUROSEMIDE 20 MG PO TABS
20.0000 mg | ORAL_TABLET | ORAL | Status: DC
  Administered 2010-11-18 – 2010-11-20 (×3): 20 mg via ORAL
  Filled 2010-11-17 (×4): qty 1

## 2010-11-17 MED ORDER — BISACODYL 5 MG PO TBEC: 10.0000 mg | DELAYED_RELEASE_TABLET | Freq: Every day | ORAL | Status: DC | PRN

## 2010-11-17 MED ORDER — ONDANSETRON HCL 4 MG PO TABS
4.0000 mg | ORAL_TABLET | Freq: Four times a day (QID) | ORAL | Status: DC | PRN
  Administered 2010-11-19 (×2): 4 mg via ORAL
  Filled 2010-11-17 (×2): qty 1

## 2010-11-17 MED ORDER — KETOROLAC TROMETHAMINE 30 MG/ML IJ SOLN
INTRAMUSCULAR | Status: DC | PRN
  Administered 2010-11-17: 30 mg

## 2010-11-17 MED ORDER — ZOLPIDEM TARTRATE 5 MG PO TABS
5.0000 mg | ORAL_TABLET | Freq: Every evening | ORAL | Status: DC | PRN
  Administered 2010-11-17: 5 mg via ORAL
  Filled 2010-11-17: qty 1

## 2010-11-17 MED ORDER — WARFARIN SODIUM 5 MG PO TABS
5.0000 mg | ORAL_TABLET | ORAL | Status: AC
  Administered 2010-11-17: 5 mg via ORAL
  Filled 2010-11-17: qty 1

## 2010-11-17 MED ORDER — FENTANYL CITRATE 0.05 MG/ML IJ SOLN
INTRAMUSCULAR | Status: DC | PRN
  Administered 2010-11-17 (×2): 50 ug via INTRAVENOUS

## 2010-11-17 MED ORDER — LUBRICATING & REWETTING SOLN: 2.0000 [drp] | Freq: Two times a day (BID) | Status: DC

## 2010-11-17 MED ORDER — FLECAINIDE ACETATE 100 MG PO TABS
100.0000 mg | ORAL_TABLET | Freq: Two times a day (BID) | ORAL | Status: DC
Start: 2010-11-17 — End: 2010-11-20
  Administered 2010-11-17 – 2010-11-20 (×6): 100 mg via ORAL
  Filled 2010-11-17 (×7): qty 1

## 2010-11-17 MED ORDER — DOCUSATE SODIUM 100 MG PO CAPS
100.0000 mg | ORAL_CAPSULE | Freq: Two times a day (BID) | ORAL | Status: DC
  Administered 2010-11-17 – 2010-11-20 (×6): 100 mg via ORAL
  Filled 2010-11-17 (×7): qty 1

## 2010-11-17 MED ORDER — ONDANSETRON HCL 4 MG/2ML IJ SOLN
4.0000 mg | Freq: Four times a day (QID) | INTRAMUSCULAR | Status: DC | PRN
  Administered 2010-11-19: 4 mg via INTRAVENOUS
  Filled 2010-11-17: qty 2

## 2010-11-17 MED ORDER — SODIUM CHLORIDE 0.9 % IV SOLN
INTRAVENOUS | Status: DC
  Administered 2010-11-17 – 2010-11-18 (×2): via INTRAVENOUS
  Filled 2010-11-17 (×14): qty 1000

## 2010-11-17 MED ORDER — MAGNESIUM HYDROXIDE 400 MG/5ML PO SUSP: 30.0000 mL | Freq: Two times a day (BID) | ORAL | Status: DC | PRN

## 2010-11-17 MED ORDER — VITAMIN D (ERGOCALCIFEROL) 1.25 MG (50000 UNIT) PO CAPS
50000.0000 [IU] | ORAL_CAPSULE | ORAL | Status: DC
  Administered 2010-11-17: 50000 [IU] via ORAL
  Filled 2010-11-17 (×2): qty 1

## 2010-11-17 MED ORDER — CEFAZOLIN SODIUM 1-5 GM-% IV SOLN
1.0000 g | Freq: Four times a day (QID) | INTRAVENOUS | Status: AC
  Administered 2010-11-17 – 2010-11-18 (×3): 1 g via INTRAVENOUS
  Filled 2010-11-17 (×3): qty 50

## 2010-11-17 MED ORDER — METOCLOPRAMIDE HCL 10 MG PO TABS: 5.0000 mg | ORAL_TABLET | Freq: Three times a day (TID) | ORAL | Status: DC | PRN

## 2010-11-17 MED ORDER — ROSUVASTATIN CALCIUM 5 MG PO TABS
5.0000 mg | ORAL_TABLET | Freq: Every day | ORAL | Status: DC
  Administered 2010-11-17 – 2010-11-19 (×3): 5 mg via ORAL
  Filled 2010-11-17 (×4): qty 1

## 2010-11-17 MED ORDER — METOCLOPRAMIDE HCL 5 MG/ML IJ SOLN: 5.0000 mg | Freq: Three times a day (TID) | INTRAMUSCULAR | Status: DC | PRN

## 2010-11-17 MED ORDER — BUPIVACAINE-EPINEPHRINE PF 0.25-1:200000 % IJ SOLN
INTRAMUSCULAR | Status: DC | PRN
  Administered 2010-11-17: 60 mL

## 2010-11-17 MED ORDER — PHENOL 1.4 % MT LIQD: 1.0000 | OROMUCOSAL | Status: DC | PRN

## 2010-11-17 MED ORDER — FLEET ENEMA 7-19 GM/118ML RE ENEM: 1.0000 | ENEMA | Freq: Every day | RECTAL | Status: DC | PRN

## 2010-11-17 MED ORDER — POLYETHYLENE GLYCOL 3350 17 G PO PACK: 17.0000 g | PACK | Freq: Every day | ORAL | Status: DC | PRN

## 2010-11-17 MED ORDER — WARFARIN VIDEO
Freq: Once | Status: AC
  Administered 2010-11-18: 12:00:00
  Filled 2010-11-17: qty 1

## 2010-11-17 MED ORDER — ONDANSETRON HCL 4 MG/2ML IJ SOLN
INTRAMUSCULAR | Status: DC | PRN
  Administered 2010-11-17: 4 mg via INTRAVENOUS

## 2010-11-17 MED ORDER — SODIUM CHLORIDE 0.9 % IR SOLN
Status: DC | PRN
  Administered 2010-11-17: 1000 mL

## 2010-11-17 MED ORDER — EPHEDRINE SULFATE 50 MG/ML IJ SOLN
INTRAMUSCULAR | Status: DC | PRN
  Administered 2010-11-17: 5 mg via INTRAVENOUS

## 2010-11-17 MED ORDER — DIPHENHYDRAMINE HCL 25 MG PO CAPS
25.0000 mg | ORAL_CAPSULE | Freq: Four times a day (QID) | ORAL | Status: DC | PRN
  Administered 2010-11-18: 25 mg via ORAL
  Filled 2010-11-17: qty 1

## 2010-11-17 MED ORDER — LACTATED RINGERS IV SOLN
INTRAVENOUS | Status: DC
  Administered 2010-11-17 (×3): via INTRAVENOUS
  Administered 2010-11-17: 1000 mL via INTRAVENOUS

## 2010-11-17 MED ORDER — MENTHOL 3 MG MT LOZG: 1.0000 | LOZENGE | OROMUCOSAL | Status: DC | PRN

## 2010-11-17 MED ORDER — DEXAMETHASONE SODIUM PHOSPHATE 10 MG/ML IJ SOLN
INTRAMUSCULAR | Status: DC | PRN
  Administered 2010-11-17: 10 mg via INTRAVENOUS

## 2010-11-17 MED ORDER — BUPIVACAINE HCL 0.75 % IJ SOLN
INTRAMUSCULAR | Status: DC | PRN
  Administered 2010-11-17: 1.6 mL

## 2010-11-17 MED ORDER — ATENOLOL 25 MG PO TABS
25.0000 mg | ORAL_TABLET | Freq: Two times a day (BID) | ORAL | Status: DC
  Administered 2010-11-17 – 2010-11-20 (×6): 25 mg via ORAL
  Filled 2010-11-17 (×7): qty 1

## 2010-11-17 MED ORDER — POLYVINYL ALCOHOL 1.4 % OP SOLN
1.0000 [drp] | OPHTHALMIC | Status: DC | PRN
  Filled 2010-11-17: qty 15

## 2010-11-17 MED ORDER — HYDROMORPHONE HCL PF 1 MG/ML IJ SOLN
0.2000 mg | INTRAMUSCULAR | Status: DC | PRN
  Administered 2010-11-17: 0.6 mg via INTRAVENOUS
  Administered 2010-11-19: 0.5 mg via INTRAVENOUS
  Administered 2010-11-19: 0.4 mg via INTRAVENOUS
  Filled 2010-11-17 (×2): qty 1

## 2010-11-17 MED ORDER — LEVOTHYROXINE SODIUM 50 MCG PO TABS
50.0000 ug | ORAL_TABLET | ORAL | Status: DC
  Administered 2010-11-18 – 2010-11-20 (×3): 50 ug via ORAL
  Filled 2010-11-17 (×4): qty 1

## 2010-11-17 MED ORDER — KETAMINE HCL 10 MG/ML IJ SOLN
INTRAMUSCULAR | Status: DC | PRN
  Administered 2010-11-17 (×2): 20 mg via INTRAVENOUS

## 2010-11-17 MED ORDER — BISACODYL 10 MG RE SUPP: 10.0000 mg | Freq: Every day | RECTAL | Status: DC | PRN

## 2010-11-17 MED ORDER — CLINDAMYCIN PHOSPHATE 600 MG/50ML IV SOLN
600.0000 mg | INTRAVENOUS | Status: DC
  Filled 2010-11-17: qty 50

## 2010-11-17 MED ORDER — PROPOFOL 10 MG/ML IV EMUL
INTRAVENOUS | Status: DC | PRN
  Administered 2010-11-17: 70 ug/kg/min via INTRAVENOUS

## 2010-11-17 MED ORDER — COUMADIN BOOK
Freq: Once | Status: AC
  Administered 2010-11-17: 21:00:00
  Filled 2010-11-17: qty 1

## 2010-11-17 SURGICAL SUPPLY — 59 items
ADH SKN CLS APL DERMABOND .7 (GAUZE/BANDAGES/DRESSINGS) ×1
BAG SPEC THK2 15X12 ZIP CLS (MISCELLANEOUS) ×1
BAG ZIPLOCK 12X15 (MISCELLANEOUS) ×2 IMPLANT
BANDAGE ELASTIC 6 VELCRO ST LF (GAUZE/BANDAGES/DRESSINGS) ×2 IMPLANT
BANDAGE ESMARK 6X9 LF (GAUZE/BANDAGES/DRESSINGS) ×1 IMPLANT
BLADE SAW SGTL 13.0X1.19X90.0M (BLADE) ×2 IMPLANT
BNDG CMPR 9X6 STRL LF SNTH (GAUZE/BANDAGES/DRESSINGS) ×1
BNDG ESMARK 6X9 LF (GAUZE/BANDAGES/DRESSINGS) ×2
BOWL SMART MIX CTS (DISPOSABLE) ×2 IMPLANT
CEMENT HV SMART SET (Cement) ×2 IMPLANT
CLOTH BEACON ORANGE TIMEOUT ST (SAFETY) ×2 IMPLANT
CUFF TOURN SGL QUICK 34 (TOURNIQUET CUFF) ×2
CUFF TRNQT CYL 34X4X40X1 (TOURNIQUET CUFF) ×1 IMPLANT
DECANTER SPIKE VIAL GLASS SM (MISCELLANEOUS) ×2 IMPLANT
DERMABOND ADVANCED (GAUZE/BANDAGES/DRESSINGS) ×1
DERMABOND ADVANCED .7 DNX12 (GAUZE/BANDAGES/DRESSINGS) ×1 IMPLANT
DRAPE EXTREMITY T 121X128X90 (DRAPE) ×2 IMPLANT
DRAPE POUCH INSTRU U-SHP 10X18 (DRAPES) ×2 IMPLANT
DRAPE U-SHAPE 47X51 STRL (DRAPES) ×2 IMPLANT
DRSG AQUACEL AG ADV 3.5X10 (GAUZE/BANDAGES/DRESSINGS) ×2 IMPLANT
DRSG TEGADERM 4X4.75 (GAUZE/BANDAGES/DRESSINGS) ×2 IMPLANT
DURAPREP 26ML APPLICATOR (WOUND CARE) ×2 IMPLANT
ELECT REM PT RETURN 9FT ADLT (ELECTROSURGICAL) ×2
ELECTRODE REM PT RTRN 9FT ADLT (ELECTROSURGICAL) ×1 IMPLANT
EVACUATOR 1/8 PVC DRAIN (DRAIN) ×2 IMPLANT
FACESHIELD LNG OPTICON STERILE (SAFETY) ×10 IMPLANT
GAUZE SPONGE 2X2 8PLY STRL LF (GAUZE/BANDAGES/DRESSINGS) ×1 IMPLANT
GLOVE BIOGEL PI IND STRL 7.5 (GLOVE) ×1 IMPLANT
GLOVE BIOGEL PI IND STRL 8 (GLOVE) ×1 IMPLANT
GLOVE BIOGEL PI IND STRL 8.5 (GLOVE) IMPLANT
GLOVE BIOGEL PI INDICATOR 7.5 (GLOVE) ×1
GLOVE BIOGEL PI INDICATOR 8 (GLOVE) ×1
GLOVE BIOGEL PI INDICATOR 8.5 (GLOVE)
GLOVE ECLIPSE 8.0 STRL XLNG CF (GLOVE) ×2 IMPLANT
GLOVE ORTHO TXT STRL SZ7.5 (GLOVE) ×4 IMPLANT
GOWN STRL NON-REIN LRG LVL3 (GOWN DISPOSABLE) ×2 IMPLANT
HANDPIECE INTERPULSE COAX TIP (DISPOSABLE) ×2
IMMOBILIZER KNEE 20 (SOFTGOODS) ×2
IMMOBILIZER KNEE 20 THIGH 36 (SOFTGOODS) IMPLANT
KIT BASIN OR (CUSTOM PROCEDURE TRAY) ×2 IMPLANT
MANIFOLD NEPTUNE II (INSTRUMENTS) ×2 IMPLANT
NDL SAFETY ECLIPSE 18X1.5 (NEEDLE) ×1 IMPLANT
NEEDLE HYPO 18GX1.5 SHARP (NEEDLE) ×2
NS IRRIG 1000ML POUR BTL (IV SOLUTION) ×4 IMPLANT
PACK TOTAL JOINT (CUSTOM PROCEDURE TRAY) ×2 IMPLANT
POSITIONER SURGICAL ARM (MISCELLANEOUS) ×2 IMPLANT
SET HNDPC FAN SPRY TIP SCT (DISPOSABLE) ×1 IMPLANT
SET PAD KNEE POSITIONER (MISCELLANEOUS) ×2 IMPLANT
SPONGE GAUZE 2X2 STER 10/PKG (GAUZE/BANDAGES/DRESSINGS) ×1
SUCTION FRAZIER 12FR DISP (SUCTIONS) ×2 IMPLANT
SUT MNCRL AB 4-0 PS2 18 (SUTURE) ×2 IMPLANT
SUT VIC AB 1 CT1 36 (SUTURE) ×6 IMPLANT
SUT VIC AB 2-0 CT1 27 (SUTURE) ×6
SUT VIC AB 2-0 CT1 TAPERPNT 27 (SUTURE) ×3 IMPLANT
SYR 50ML LL SCALE MARK (SYRINGE) ×2 IMPLANT
TOWEL OR 17X26 10 PK STRL BLUE (TOWEL DISPOSABLE) ×4 IMPLANT
TRAY FOLEY CATH 14FRSI W/METER (CATHETERS) ×2 IMPLANT
WATER STERILE IRR 1500ML POUR (IV SOLUTION) ×2 IMPLANT
WRAP KNEE MAXI GEL POST OP (GAUZE/BANDAGES/DRESSINGS) ×2 IMPLANT

## 2010-11-17 NOTE — Op Note (Signed)
NAME:  [name]                      MEDICAL RECORD NO.:  [id number]                             FACILITY:  [facility]      PHYSICIAN:  Madlyn Frankel. Charlann Boxer, M.D.  DATE OF BIRTH:  1932/03/31      DATE OF PROCEDURE:  11/17/2010                                     OPERATIVE REPORT         PREOPERATIVE DIAGNOSIS:  Left knee osteoarthritis.      POSTOPERATIVE DIAGNOSIS:  Left knee osteoarthritis.      FINDINGS:  The patient was noted to have complete loss of cartilage and   bone-on-bone arthritis in the lateral and patellofemoral compartments of   his knee with a significant synovitis and associated large effusion.      PROCEDURE:  Left total knee replacement.      COMPONENTS USED:  DePuy rotating platform posterior stabilized knee   system, a size 2.5 femur, 2.5 tibia, 10 mm insert, and 35 patellar   button.      SURGEON:  Madlyn Frankel. Charlann Boxer, M.D.      ASSISTANT:  Lelon Huh, PA-C.      ANESTHESIA:  Spinal.      SPECIMENS:  None.      COMPLICATION:  None.      DRAINS:  One Hemovac.      TOURNIQUET TIME:   Total Tourniquet Time Documented: Thigh (Left) - 35 minutes .      The patient was stable to the recovery room.      INDICATION FOR PROCEDURE:  [name] is a 77 y.o. female patient of   mine.  The patient had been seen, evaluated, and treated conservatively in the   office with medication, activity modification, and injections.  The patient had   radiographic changes of bone-on-bone arthritis with endplate sclerosis and osteophytes noted.      The patient failed conservative measures including medication, injections, and activity modification, and at this point was ready for more definitive measures.   Based on his radiographic changes and failed conservative measures, the patient   decided to proceed with total knee replacement.  Risks of infection,   DVT, component failure, need for revision surgery, postop course, and   expectations particularly given his  chronic pain issues were all   discussed and reviewed.  Consent was obtained for benefit of pain   relief.      PROCEDURE IN DETAIL:  The patient was brought to the operative theater.   Once adequate anesthesia, preoperative antibiotics, Ancef administered 1g,  he was positioned supine with the right thigh tourniquet placed.  The   right lower extremity was prepped and draped in sterile fashion.  A time-   out was performed identifying the patient, planned procedure, and   extremity.      The left lower extremity was placed in the Mayo leg holder.  Leg was   exsanguinated, tourniquet elevated to 250 mmHg.  A midline incision was   made followed by median parapatellar arthrotomy following initial   exposure.  Attention was first directed to the patella.  Precut  measurement was noted to be 22 mm.  I resected down to 13 mm and used a   35 patellar button to restore patellar height as well as cover the cut   surface.      The lug holes were drilled and a metal Shim was placed to protect the   patella from retractors and saw blades.      At this point, attention was now directed to the femur.  The femoral   canal was opened with a drill, irrigated to try to prevent fat emboli.   Intramedullary rod was passed at 5 degrees valgus, 10 mm of bone   resected off the distal femur.  Following this resection, the tibia was   subluxated anteriorly.  Using extramedullary guide, 4 mm of bone off   the more effected lateral proximal tibia was resected.  We confirmed the gap would be   stable medially and laterally with a 10 mm insert as well as confirmed   the cut, was perpendicular in the coronal plane.      Once this was done, I sized the femur to be a size 2.5 in the anterior-   posterior dimension, chose a 2.5 standard component based on medial and   lateral dimension.  The size 2.5 rotation block was then pinned in   position anterior reference using the C-clamp to set rotation.  The    anterior posterior chamfer cuts were made without difficulty nor   notching making certain that I was along the anterior cortex to help   with flexion gap stability.      The final box cut was made off the lateral aspect of distal femur.      At this point, the tibia was sized to be a size 2.5, the size 2.5 tray was   then pinned in position through the medial third of the tubercle,   drilled, and keel punched.  Trial reduction was now carried with a 2.5 femur,  2.5 tibia, a 10 mm insert, and the 35 patella botton.  The knee was brought to   extension, full extension with good flexion stability with the patella   tracking through the trochlea without application of pressure.  Given   all these findings, the trial components removed.  Final components were   opened and cement was mixed.  The knee was irrigated with normal saline   solution and pulse lavage.  The synovial lining was   then injected with 0.25% Marcaine with epinephrine and 1 cc of Toradol,   total of 61 cc.      The knee was irrigated.  Final implants were then cemented onto clean and   dried cut surfaces of bone with the knee brought to extension with a 10   mm insert.      Once the cement had fully cured, the excess cement was removed   throughout the knee.  I confirmed I was satisfied with the range of   motion and stability, and the final 10 mm insert was chosen.  It was   placed into the knee.      The tourniquet had been let down at 35 minutes.  No significant   hemostasis required.  The medium Hemovac drain was placed deep.  The   extensor mechanism was then reapproximated using #1 Vicryl with the knee   in flexion.  The   remaining wound was closed with 2-0 Vicryl and running 4-0 Monocryl.   The knee was  cleaned, dried, dressed sterilely using Dermabond and   Aquacel dressing.  Drain site dressed separately.  The patient was then   brought to recovery room in stable condition, tolerating the procedure   well.     Please note that Physician Assistant, Leilani Able, was present for the entirety of the case, and was utilized for pre-operative positioning, peri-operative retractor management, general facilitation of the procedure.  He was also utilized for primary wound closure at the end of the case.              Madlyn Frankel Charlann Boxer, M.D.

## 2010-11-17 NOTE — Anesthesia Procedure Notes (Signed)
Spinal  Patient location during procedure: OR Start time: 11/17/2010 11:35 AM End time: 11/17/2010 11:38 AM Staffing CRNA/Resident: Joycie Peek Performed by: anesthesiologist  Preanesthetic Checklist Completed: patient identified, site marked, surgical consent, pre-op evaluation, timeout performed, IV checked, risks and benefits discussed and monitors and equipment checked Spinal Block Patient position: sitting Prep: Betadine Patient monitoring: heart rate, continuous pulse ox and blood pressure Approach: midline Location: L3-4 Injection technique: single-shot Needle Needle type: Spinocan  Needle gauge: 22 G Needle length: 9 cm Assessment Sensory level: T6 Additional Notes Expiration date of kit checked and confirmed. Patient tolerated procedure well, without complications.

## 2010-11-17 NOTE — Interval H&P Note (Signed)
History and Physical Interval Note:   11/17/2010   11:43 AM   [name]  has presented today for surgery, with the diagnosis of Osteoarthritis of the Left Knee  The various methods of treatment have been discussed with the patient and family. After consideration of risks, benefits and other options for treatment, the patient has consented to  Procedure(s): TOTAL KNEE ARTHROPLASTY as a surgical intervention .  The patients' history has been reviewed, patient examined, no change in status, stable for surgery.  I have reviewed the patients' chart and labs.  Questions were answered to the patient's satisfaction.     Shelda Pal  MD

## 2010-11-17 NOTE — Consult Note (Signed)
ANTICOAGULATION CONSULT NOTE - Initial Consult  Pharmacy Consult for Warfarin Indication: VTE prophylaxis  Allergies  Allergen Reactions  . Penicillins Rash    Patient Measurements: Height: 5\' 3"  (160 cm) Weight: 150 lb (68.04 kg) IBW/kg (Calculated) : 52.4  Adjusted Body Weight:   Vital Signs: Temp: 98.2 F (36.8 C) (11/13 1800) Temp src: Oral (11/13 1002) BP: 149/74 mmHg (11/13 1800) Pulse Rate: 61  (11/13 1800)  Labs:  Basename 11/17/10 1045  HGB --  HCT --  PLT --  APTT 30  LABPROT 13.7  INR 1.03  HEPARINUNFRC --  CREATININE --  CKTOTAL --  CKMB --  TROPONINI --   Estimated Creatinine Clearance: 55.3 ml/min (by C-G formula based on Cr of 0.65).  Medical History: Past Medical History  Diagnosis Date  . Atrial fibrillation     Paroxysmal, Flecainide therapy  . Warfarin anticoagulation   . Drug therapy     Flecainide for atrial fibrillation  . Hypothyroidism   . Carotid bruit     Doppler, December, 2009, no abnormality  . Ejection fraction     EF normal, echo, 2003  . Normal nuclear stress test     Normal, 2001  . Diverticulosis   . HLD (hyperlipidemia)   . HTN (hypertension)   . Osteoarthritis   . S/P knee replacement     Patient is status post right knee replacement   . Preop cardiovascular exam     Surgical clearance for left knee replacement if it is scheduled over the next many months  . Calf pain     September, 2012, at rest  . PONV (postoperative nausea and vomiting)       Assessment: Pt is after ortho surgery, warfarin per pharmacy ordered, INR 1.03 Goal of Therapy:  INR 2-3   Plan:  Warfarin 5mg  x1, daily PT/INR, coumadin book/video  Darlina Guys, Jacquenette Shone Crowford 11/17/2010,7:19 PM

## 2010-11-17 NOTE — Progress Notes (Signed)
size 42fr. Catheter with  Yellow, clear urine returned at placement

## 2010-11-17 NOTE — Transfer of Care (Signed)
Immediate Anesthesia Transfer of Care Note  Patient: [name]  Procedure(s) Performed:  TOTAL KNEE ARTHROPLASTY  Patient Location: PACU  Anesthesia Type: Regional  Level of Consciousness: awake and sedated  Airway & Oxygen Therapy: Patient Spontanous Breathing and Patient connected to face mask oxygen  Post-op Assessment: Report given to PACU RN and Post -op Vital signs reviewed and stable  Post vital signs: Reviewed and stable  Complications: No apparent anesthesia complications

## 2010-11-17 NOTE — Anesthesia Postprocedure Evaluation (Signed)
  Anesthesia Post-op Note  Patient: [name]  Procedure(s) Performed:  TOTAL KNEE ARTHROPLASTY  Patient Location: PACU  Anesthesia Type: Spinal  Level of Consciousness: awake and alert   Airway and Oxygen Therapy: Patient Spontanous Breathing  Post-op Pain: mild  Post-op Assessment: Post-op Vital signs reviewed, Patient's Cardiovascular Status Stable, Respiratory Function Stable, Patent Airway and No signs of Nausea or vomiting  Post-op Vital Signs: stable  Complications: No apparent anesthesia complications

## 2010-11-17 NOTE — Anesthesia Preprocedure Evaluation (Addendum)
Anesthesia Evaluation  Patient identified by MRN, date of birth, ID band Patient awake    Reviewed: Allergy & Precautions, H&P , NPO status , Patient's Chart, lab work & pertinent test results  Airway Mallampati: II      Dental  (+) Teeth Intact   Pulmonary neg pulmonary ROS,  clear to auscultation        Cardiovascular hypertension, neg cardio ROS + dysrhythmias regular  A fib   Neuro/Psych Negative Neurological ROS  Negative Psych ROS   GI/Hepatic negative GI ROS, Neg liver ROS,   Endo/Other  Negative Endocrine ROSHypothyroidism   Renal/GU negative Renal ROS  Genitourinary negative   Musculoskeletal negative musculoskeletal ROS (+)   Abdominal   Peds negative pediatric ROS (+)  Hematology negative hematology ROS (+)   Anesthesia Other Findings   Reproductive/Obstetrics negative OB ROS                           Anesthesia Physical Anesthesia Plan  ASA: III  Anesthesia Plan: Spinal   Post-op Pain Management:    Induction:   Airway Management Planned:   Additional Equipment:   Intra-op Plan:   Post-operative Plan:   Informed Consent: I have reviewed the patients History and Physical, chart, labs and discussed the procedure including the risks, benefits and alternatives for the proposed anesthesia with the patient or authorized representative who has indicated his/her understanding and acceptance.     Plan Discussed with: CRNA  Anesthesia Plan Comments:        Anesthesia Quick Evaluation

## 2010-11-18 LAB — CBC
MCHC: 31.4 g/dL (ref 30.0–36.0)
Platelets: 260 10*3/uL (ref 150–400)
RDW: 16.8 % — ABNORMAL HIGH (ref 11.5–15.5)

## 2010-11-18 LAB — PROTIME-INR
INR: 1.05 (ref 0.00–1.49)
Prothrombin Time: 13.9 seconds (ref 11.6–15.2)

## 2010-11-18 LAB — BASIC METABOLIC PANEL
BUN: 9 mg/dL (ref 6–23)
Calcium: 8.2 mg/dL — ABNORMAL LOW (ref 8.4–10.5)
GFR calc non Af Amer: 89 mL/min — ABNORMAL LOW (ref >90)
Glucose, Bld: 148 mg/dL — ABNORMAL HIGH (ref 70–99)
Sodium: 136 mEq/L (ref 135–145)

## 2010-11-18 MED ORDER — WARFARIN SODIUM 5 MG PO TABS
5.0000 mg | ORAL_TABLET | Freq: Every day | ORAL | Status: DC
  Administered 2010-11-18 – 2010-11-19 (×2): 5 mg via ORAL
  Filled 2010-11-18 (×3): qty 1

## 2010-11-18 NOTE — Patient Instructions (Signed)
Pt. Was instructed re: donning/doffing Knee immobilizer; proper technique for self care activities, toilet transfers, and discussed use of DME

## 2010-11-18 NOTE — Progress Notes (Signed)
Subjective: 1 Day Post-Op Procedure(s): TOTAL KNEE ARTHROPLASTY (LEFT)  Patient reports pain as mild.  Objective:   VITALS:   Filed Vitals:   11/18/10 0600  BP: 128/70  Pulse: 60  Temp: 98.4 F (36.9 C)  Resp: 18    Neurovascular intact Dorsiflexion/Plantar flexion intact Incision: dressing C/D/I No cellulitis present Compartment soft  LABS  Basename 11/18/10 0405  HGB 8.2*  HCT 26.1*  WBC 11.2*  PLT 260     Basename 11/18/10 0405  NA 136  K 4.3  BUN 9  CREATININE 0.55  GLUCOSE 148*     Basename 11/18/10 0405 11/17/10 1045  LABPT -- --  INR 1.05 1.03     Assessment/Plan: 1 Day Post-Op Procedure(s): TOTAL KNEE ARTHROPLASTY (LEFT)   Advance diet Up with therapy D/C IV fluids Plan for discharge tomorrow Discharge home with home health on d/c   Anastasio Auerbach. Bryam Taborda   PAC  11/18/2010, 7:35 AM

## 2010-11-18 NOTE — Progress Notes (Signed)
Physical Therapy Evaluation Patient Details Name: ANNIAH GLICK MRN: [id number] DOB: [birth year] Today's Date: 11/18/2010 Time: [phone] Charge: Delia Heady  Problem List:  Patient Active Problem List  Diagnoses  . UNSPECIFIED ANEMIA  . DYSPEPSIA  . DIVERTICULOSIS-COLON  . DYSPHAGIA  . ABDOMINAL PAIN RIGHT LOWER QUADRANT  . ABDOMINAL PAIN-LLQ  . ABDOMINAL PAIN, EPIGASTRIC  . Atrial fibrillation  . Warfarin anticoagulation  . Drug therapy  . Hypothyroidism  . Carotid bruit  . Ejection fraction  . Normal nuclear stress test  . S/P knee replacement  . S/P knee replacement  . Preop cardiovascular exam  . Calf pain    Past Medical History:  Past Medical History  Diagnosis Date  . Atrial fibrillation     Paroxysmal, Flecainide therapy  . Warfarin anticoagulation   . Drug therapy     Flecainide for atrial fibrillation  . Hypothyroidism   . Carotid bruit     Doppler, December, 2009, no abnormality  . Ejection fraction     EF normal, echo, 2003  . Normal nuclear stress test     Normal, 2001  . Diverticulosis   . HLD (hyperlipidemia)   . HTN (hypertension)   . Osteoarthritis   . S/P knee replacement     Patient is status post right knee replacement   . Preop cardiovascular exam     Surgical clearance for left knee replacement if it is scheduled over the next many months  . Calf pain     September, 2012, at rest  . PONV (postoperative nausea and vomiting)    Past Surgical History:  Past Surgical History  Procedure Date  . Joint replacement     right knee replacement 05/26/10  . Joint replacement     left shoulder replacement 1/12  . Total knee arthroplasty     Total right knee, leg, 2012, Dr. Charlann Boxer  . Shoulder surgery     left-very limited rom left arm  . Shoulder open rotator cuff repair     bilateral    PT Assessment/Plan/Recommendation PT Assessment Clinical Impression Statement: Pt s/p L TKR and presents with decreased AROM and strength of L LE.  Pt would  benefit from acute PT services in order to improve strength/ROM of L LE and Increase independence with ambulation and stairs for safe D/C home with spouse and sister to assist as needed. PT Recommendation/Assessment: Patient will need skilled PT in the acute care venue PT Problem List: Decreased strength;Decreased range of motion;Decreased activity tolerance;Decreased mobility;Decreased knowledge of precautions;Decreased knowledge of use of DME;Pain PT Therapy Diagnosis : Difficulty walking;Acute pain PT Plan PT Frequency: 7X/week PT Treatment/Interventions: DME instruction;Gait training;Stair training;Functional mobility training;Therapeutic exercise;Neuromuscular re-education;Patient/family education PT Recommendation Follow Up Recommendations: Home health PT (pt prefers [facility] and PT Animator) Equipment Recommended: None recommended by PT PT Goals  Acute Rehab PT Goals PT Goal Formulation: With patient Time For Goal Achievement: 7 days Pt will go Supine/Side to Sit: with supervision PT Goal: Supine/Side to Sit - Progress: Not met Pt will Transfer Sit to Stand/Stand to Sit: with supervision PT Transfer Goal: Sit to Stand/Stand to Sit - Progress: Progressing toward goal Pt will Ambulate: 51 - 150 feet;with supervision;with rolling walker PT Goal: Ambulate - Progress: Progressing toward goal Pt will Go Up / Down Stairs: 1-2 stairs;with rail(s);with supervision PT Goal: Up/Down Stairs - Progress: Not met  PT Evaluation Precautions/Restrictions  Precautions Precautions: Knee Precaution Comments: no pillows under knee Required Braces or Orthoses: Yes Knee Immobilizer: Discontinue once straight leg  raise with < 10 degree lag Restrictions Weight Bearing Restrictions: Yes LLE Weight Bearing: Weight bearing as tolerated Prior Functioning  Home Living Lives With: Spouse Type of Home: House Home Layout: Two level;Able to live on main level with bedroom/bathroom (2nd level to  basement) Alternate Level Stairs-Rails: Can reach both Alternate Level Stairs-Number of Steps: 13 Home Access: Stairs to enter Entrance Stairs-Rails: Can reach both Entrance Stairs-Number of Steps: 1 step onto porch and then 1 into house Home Adaptive Equipment: Walker - rolling;Bedside commode/3-in-1 Additional Comments: Plans to have sister come and assist upon return home Prior Function Level of Independence: Independent with basic ADLs;Independent with homemaking with ambulation;Independent with gait 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 [employer] Arousal/Alertness: Awake/alert Overall Cognitive Status: Appears within functional limits for tasks assessed Orientation Level: Oriented X4 Sensation/Coordination Sensation Light Touch: Appears Intact Extremity Assessment RLE Assessment RLE Assessment: Within Functional Limits LLE Assessment LLE Assessment: Exceptions to WFL LLE AROM (degrees) Left Knee Extension 0-130: 0  Left Knee Flexion 0-140: 95  LLE Strength LLE Overall Strength Comments: Pt able to perform SLR and AROM, so at least 3/5 Mobility (including Balance) Bed Mobility Bed Mobility: No Transfers Transfers: Yes Sit to Stand: 4: Min assist;From chair/3-in-1 Sit to Stand Details (indicate cue type and reason): min/guard verbal cues for L LE forward and R foot back, use of armrests Stand to Sit: 4: Min assist;To chair/3-in-1 Stand to Sit Details: min/guard verbal cues for L LE forward and R foot back, use of armrests; verbal cue to bring RW back to chair Ambulation/Gait Ambulation/Gait: Yes Ambulation/Gait Assistance: 4: Min assist Ambulation/Gait Assistance Details (indicate cue type and reason): min/guard, verbal cues for sequence and safe RW distance Ambulation Distance (Feet): 60 Feet Assistive device: Rolling walker Gait Pattern: Step-to pattern;Decreased dorsiflexion - right;Decreased dorsiflexion - left;Antalgic Gait  velocity: slow cadence    Exercise    End of Session PT - End of Session Equipment Utilized During Treatment: Left knee immobilizer Activity Tolerance: Patient limited by pain;Patient limited by fatigue Patient left: in chair;with call bell in reach;with family/visitor present General Behavior During Session: [facility] for tasks performed Cognition: [facility] for tasks performed  Kavian Peters,[name] 11/18/2010, 12:49 PM

## 2010-11-18 NOTE — Progress Notes (Signed)
Utilization review completed. Marland Kitchens

## 2010-11-18 NOTE — Progress Notes (Signed)
Physical Therapy Treatment Patient Details Name: [name] MRN: [id number] DOB: [birth year] Today's Date: 11/18/2010 Time: 7829-5621 Charge: TE  PT Assessment/Plan  PT - Assessment/Plan Comments on Treatment Session: Pt doing well with exercises and hopes to D/C home tomorrow. PT Plan: Discharge plan remains appropriate;Frequency remains appropriate PT Frequency: 7X/week Follow Up Recommendations: Home health PT Equipment Recommended: None recommended by PT PT Goals  Acute Rehab PT Goals PT Goal Formulation: With patient Time For Goal Achievement: 7 days Pt will go Supine/Side to Sit: with supervision PT Goal: Supine/Side to Sit - Progress: Not met Pt will Transfer Sit to Stand/Stand to Sit: with supervision PT Transfer Goal: Sit to Stand/Stand to Sit - Progress: Not met Pt will Ambulate: 51 - 150 feet;with supervision;with rolling walker PT Goal: Ambulate - Progress: Not met Pt will Go Up / Down Stairs: 1-2 stairs;with rail(s);with supervision PT Goal: Up/Down Stairs - Progress: Not met  PT Treatment Precautions/Restrictions  Precautions Precautions: Knee Precaution Comments: no pillows under knee Required Braces or Orthoses: Yes Knee Immobilizer: Discontinue once straight leg raise with < 10 degree lag Restrictions Weight Bearing Restrictions: Yes RLE Weight Bearing: Weight bearing as tolerated LLE Weight Bearing: Weight bearing as tolerated Mobility (including Balance) Bed Mobility Bed Mobility: No Transfers Transfers: No Ambulation/Gait Ambulation/Gait: No (Pt declined 2* increased knee pain after exercises.)   Exercise  Total Joint Exercises Ankle Circles/Pumps: AROM;Both;20 reps;Seated Quad Sets: AROM;Strengthening;Both;20 reps;Seated Heel Slides: Seated;20 reps;Strengthening;AROM;Left (Pt AROM sitting EOC with towel under foot to slide. ) Hip ABduction/ADduction: AROM;Strengthening;Left;20 reps;Seated Straight Leg Raises: AROM;Strengthening;Left;10  reps;Seated Long Arc Quad: AROM;20 reps;Strengthening;Left;Seated Knee Flexion:  (Pt able to perform approximately 110* of knee flexion w/ HS.) End of Session PT - End of Session Equipment Utilized During Treatment: Left knee immobilizer Activity Tolerance: Patient tolerated treatment well Patient left: in chair;with call bell in reach;with family/visitor present General Behavior During Session: [facility] for tasks performed Cognition: [facility] for tasks performed  Omya Winfield,[name] 11/18/2010, 3:14 PM

## 2010-11-18 NOTE — Progress Notes (Signed)
ANTICOAGULATION CONSULT NOTE - Follow Up Consult  Pharmacy Consult for Warfarin Indication: VTE prophylaxis with Hx of Afib  Allergies  Allergen Reactions  . Penicillins Rash    Patient Measurements: Height: 5\' 3"  (160 cm) Weight: 150 lb (68.04 kg) IBW/kg (Calculated) : 52.4   Vital Signs: Temp: 98.4 F (36.9 C) (11/14 0600) Temp src: Oral (11/14 0600) BP: 128/70 mmHg (11/14 0600) Pulse Rate: 60  (11/14 0600)  Labs:  Basename 11/18/10 0405 11/17/10 1045  HGB 8.2* --  HCT 26.1* --  PLT 260 --  APTT -- 30  LABPROT 13.9 13.7  INR 1.05 1.03  HEPARINUNFRC -- --  CREATININE 0.55 --  CKTOTAL -- --  CKMB -- --  TROPONINI -- --   Estimated Creatinine Clearance: 55.3 ml/min (by C-G formula based on Cr of 0.55).   Medications:  Scheduled:    . atenolol  25 mg Oral BID  . ceFAZolin (ANCEF) IV  1 g Intravenous Q6H  . coumadin book   Does not apply Once  . docusate sodium  100 mg Oral BID  . ferrous sulfate  325 mg Oral TID PC  . flecainide  100 mg Oral BID  . furosemide  20 mg Oral QAM  . levothyroxine  50 mcg Oral QAM  . loratadine  10 mg Oral Daily  . rosuvastatin  5 mg Oral QHS  . Vitamin D (Ergocalciferol)  50,000 Units Oral Weekly  . warfarin  5 mg Oral NOW  . warfarin   Does not apply Once  . DISCONTD: chlorhexidine  60 mL Topical Once  . DISCONTD: chlorhexidine  60 mL Topical Once  . DISCONTD: clindamycin (CLEOCIN) IV  600 mg Intravenous 60 min Pre-Op  . DISCONTD: LUBRICATING & REWETTING  2 drop Both Eyes BID  . DISCONTD: LUBRICATING & REWETTING  2 drop Both Eyes BID   Infusions:    . sodium chloride 0.9 % 1,000 mL with potassium chloride 10 mEq infusion 100 mL/hr at 11/18/10 0541  . DISCONTD: lactated ringers 100 mL/hr at 11/17/10 1345   PRN: alum & mag hydroxide-simeth, bisacodyl, bisacodyl, diphenhydrAMINE, HYDROcodone-acetaminophen, HYDROmorphone, magnesium hydroxide, meclizine, menthol-cetylpyridinium, methocarbamol(ROBAXIN) IV, methocarbamol,  metoCLOPramide (REGLAN) injection, metoCLOPramide, ondansetron (ZOFRAN) IV, ondansetron, phenol, polyethylene glycol, polyvinyl alcohol, sodium phosphate, zolpidem, DISCONTD: bupivacaine-EPINEPHrine (PF), DISCONTD: HYDROmorphone DISCONTD: ketorolac, DISCONTD: sodium chloride irrigation  Assessment: 77 yo F s/p L TKA. Warfarin re-initiation underway. INR 1.05, unchanged from yesterday as expected. On warfarin prior to admission for Hx of Afib. No bleeding reported in chart notes. Plan for possible discharge tomorrow noted.  Prior to admission dose: warfarin 5mg  daily Drug interactions: Synthroid  Goal of Therapy:  INR 2-3   Plan:  Will resume scheduled home dose of warfarin 5mg  Q 18:00 for anticipated discharge tomorrow. F/U daily INRs while in hospital.  Annia Belt, PharmD   [phone] / [phone] 11/18/2010,8:43 AM

## 2010-11-18 NOTE — Progress Notes (Signed)
CM spoke with pt.  Please see [facility] note in shadow chart.

## 2010-11-18 NOTE — Progress Notes (Signed)
Occupational Therapy Evaluation Patient Details Name: [name] MRN: [id number] DOB: November 18, 1932 Today's Date: 11/18/2010  Problem List:  Patient Active Problem List  Diagnoses  . UNSPECIFIED ANEMIA  . DYSPEPSIA  . DIVERTICULOSIS-COLON  . DYSPHAGIA  . ABDOMINAL PAIN RIGHT LOWER QUADRANT  . ABDOMINAL PAIN-LLQ  . ABDOMINAL PAIN, EPIGASTRIC  . Atrial fibrillation  . Warfarin anticoagulation  . Drug therapy  . Hypothyroidism  . Carotid bruit  . Ejection fraction  . Normal nuclear stress test  . S/P knee replacement  . S/P knee replacement  . Preop cardiovascular exam  . Calf pain    Past Medical History:  Past Medical History  Diagnosis Date  . Atrial fibrillation     Paroxysmal, Flecainide therapy  . Warfarin anticoagulation   . Drug therapy     Flecainide for atrial fibrillation  . Hypothyroidism   . Carotid bruit     Doppler, December, 2009, no abnormality  . Ejection fraction     EF normal, echo, 2003  . Normal nuclear stress test     Normal, 2001  . Diverticulosis   . HLD (hyperlipidemia)   . HTN (hypertension)   . Osteoarthritis   . S/P knee replacement     Patient is status post right knee replacement   . Preop cardiovascular exam     Surgical clearance for left knee replacement if it is scheduled over the next many months  . Calf pain     September, 2012, at rest  . PONV (postoperative nausea and vomiting)    Past Surgical History:  Past Surgical History  Procedure Date  . Joint replacement     right knee replacement 05/26/10  . Joint replacement     left shoulder replacement 1/12  . Total knee arthroplasty     Total right knee, leg, 2012, Dr. Charlann Boxer  . Shoulder surgery     left-very limited rom left arm  . Shoulder open rotator cuff repair     bilateral    OT Assessment/Plan/Recommendation OT Assessment Clinical Impression Statement: This 77 y.o. female presents to OT s/p TKA.  All education completed, pt. has all DME.  No further OT needs  identified.  Will sign off. OT Recommendation/Assessment: Patient does not need any further OT services OT Recommendation Equipment Recommended: None recommended by OT OT Goals    OT Evaluation Precautions/Restrictions  Precautions Precautions: Knee Precaution Comments: no pillows under knee Required Braces or Orthoses: Yes Knee Immobilizer: Discontinue once straight leg raise with < 10 degree lag Restrictions Weight Bearing Restrictions: Yes RLE Weight Bearing: Weight bearing as tolerated LLE Weight Bearing: Weight bearing as tolerated Prior Functioning Home Living Lives With: Spouse Type of Home: House Home Layout: Laundry or work area in basement Alternate Level Stairs-Rails: Can reach both Alternate Level Stairs-Number of Steps: 13 Home Access: Stairs to enter Entrance Stairs-Rails: Can reach both Entrance Stairs-Number of Steps: 1 step onto porch and then 1 into house Bathroom Shower/Tub: Tub/shower unit;Curtain (Pt. plans to sponge bathe until can step into tub) Bathroom Toilet: Handicapped height Home Adaptive Equipment: Bedside commode/3-in-1;Tub transfer bench;Walker - rolling;Quad cane Additional Comments: Pt. reports that tub transfer bench is too large for her bathroom and she is unable to use.  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 ADL ADL Eating/Feeding: Simulated;Independent Grooming: Performed;Wash/dry hands (min guard assist) Where Assessed -  Grooming: Standing at sink Lower Body Bathing: Simulated (min guard assist) Where Assessed - Lower Body Bathing: Sit to stand from chair Lower Body Dressing: Simulated (min guard assist) Where Assessed - Lower Body Dressing: Sit to stand from chair Toilet Transfer: Performed (min guard assist) Toilet Transfer Method: Ambulating Toilet  Transfer Equipment: Comfort height toilet Toileting - Clothing Manipulation: Performed;Independent Where Assessed - Toileting Clothing Manipulation: Sit on 3-in-1 or toilet;Sit to stand from 3-in-1 or toilet Toileting - Hygiene: Performed;Independent Where Assessed - Toileting Hygiene: Sit on 3-in-1 or toilet Tub/Shower Transfer:  (Pt. will sponge bathe until can stop over tub) Equipment Used: Rolling walker (knee immobilizer) ADL Comments: Pt. instructed on donning/doffing of knee immobilizer.  After two attempts,  pt. able to don/doff independently.  Pt. reports she has necessary assist at home, and all DME. Vision/Perception  Vision - History Baseline Vision: Wears glasses all the time Cognition Cognition Arousal/Alertness: Awake/alert Overall Cognitive Status: Appears within functional limits for tasks assessed Orientation Level: Oriented X4 Sensation/Coordination Sensation Light Touch: Appears Intact Extremity Assessment RUE Assessment RUE Assessment: Within Functional Limits LUE Assessment LUE Assessment: Exceptions to WFL LUE AROM (degrees) Overall AROM Left Upper Extremity: Due to premorbid status (previous shoulder replacement) Mobility  Bed Mobility Bed Mobility: No Transfers Transfers: Yes Sit to Stand: 4: Min assist (min guard assist) Sit to Stand Details (indicate cue type and reason): min/guard verbal cues for L LE forward and R foot back, use of armrests Stand to Sit: 4: Min assist (min guard assist) Stand to Sit Details: min/guard verbal cues for L LE forward and R foot back, use of armrests; verbal cue to bring RW back to chair Exercises   End of Session OT - End of Session Equipment Utilized During Treatment: Left knee immobilizer Activity Tolerance: Patient tolerated treatment well Patient left: in chair;with call bell in reach;with family/visitor present General Behavior During Session: [facility] for tasks performed Cognition: [facility] for tasks performed    [name] M 11/18/2010, 1:22 PM

## 2010-11-19 ENCOUNTER — Encounter (HOSPITAL_COMMUNITY): Payer: Self-pay | Admitting: Orthopedic Surgery

## 2010-11-19 LAB — BASIC METABOLIC PANEL
BUN: 9 mg/dL (ref 6–23)
Calcium: 7.7 mg/dL — ABNORMAL LOW (ref 8.4–10.5)
Creatinine, Ser: 0.69 mg/dL (ref 0.50–1.10)
GFR calc Af Amer: >90 mL/min (ref >90)
GFR calc non Af Amer: 82 mL/min — ABNORMAL LOW (ref >90)

## 2010-11-19 LAB — CBC
HCT: 24 % — ABNORMAL LOW (ref 36.0–46.0)
MCHC: 31.7 g/dL (ref 30.0–36.0)
MCV: 83.6 fL (ref 78.0–100.0)
RDW: 16.7 % — ABNORMAL HIGH (ref 11.5–15.5)

## 2010-11-19 LAB — PROTIME-INR: INR: 1.29 (ref 0.00–1.49)

## 2010-11-19 MED ORDER — PROMETHAZINE HCL 25 MG/ML IJ SOLN
12.5000 mg | Freq: Four times a day (QID) | INTRAMUSCULAR | Status: DC | PRN
  Administered 2010-11-19: 12.5 mg via INTRAVENOUS

## 2010-11-19 MED ORDER — PROMETHAZINE HCL 25 MG/ML IJ SOLN
INTRAMUSCULAR | Status: AC
  Administered 2010-11-19: 12.5 mg via INTRAVENOUS
  Filled 2010-11-19: qty 1

## 2010-11-19 NOTE — Progress Notes (Signed)
Subjective: 2 Days Post-Op Procedure(s): TOTAL KNEE ARTHROPLASTY (LEFT)   Patient reports pain as mild. C/o some nausea, believes it is from the pain medicine and getting behind on it. Feels like it is getting.  Objective:   VITALS:   Filed Vitals:   11/19/10 0500  BP: 148/73  Pulse: 70  Temp: 99 F (37.2 C)  Resp: 18    Neurovascular intact Dorsiflexion/Plantar flexion intact Incision: dressing C/D/I No cellulitis present Compartment soft  LABS  Basename 11/19/10 0423 11/18/10 0405  HGB 7.6* 8.2*  HCT 24.0* 26.1*  WBC 10.3 11.2*  PLT 239 260     Basename 11/19/10 0423 11/18/10 0405  NA 130* 136  K 3.7 4.3  BUN 9 9  CREATININE 0.69 0.55  GLUCOSE 103* 148*     Basename 11/19/10 0423 11/18/10 0405  LABPT -- --  INR 1.29 1.05     Assessment/Plan: 2 Days Post-Op Procedure(s): TOTAL KNEE ARTHROPLASTY (LEFT)  Aware of H&H, will get new CBC tomorrow AM. Not symptomatic at this time. Up with therapy Plan for discharge tomorrow Discharge home with home health   Anastasio Auerbach. Moustapha Tooker   PAC  11/19/2010, 10:36 AM

## 2010-11-19 NOTE — Progress Notes (Signed)
Physical Therapy Treatment Patient Details Name: [name] MRN: [id number] DOB: [birth year] Today's Date: 11/19/2010 Time: 4098-1191 Charge: Elouise Munroe  PT Assessment/Plan  PT - Assessment/Plan Comments on Treatment Session: Pt continues to be limited by nausea and vomitting today but still able to participate with ambulating to bathroom and performing exercises.  RN present in room upon exiting. PT Plan: Discharge plan remains appropriate;Frequency remains appropriate Follow Up Recommendations: Home health PT Equipment Recommended: None recommended by PT PT Goals  Acute Rehab PT Goals PT Goal: Supine/Side to Sit - Progress: Met PT Transfer Goal: Sit to Stand/Stand to Sit - Progress: Progressing toward goal PT Goal: Ambulate - Progress: Progressing toward goal PT Goal: Up/Down Stairs - Progress: Not met  PT Treatment Precautions/Restrictions  Precautions Precautions: Knee Precaution Comments: no pillows under knee Required Braces or Orthoses: Yes Knee Immobilizer: Discontinue once straight leg raise with < 10 degree lag Restrictions Weight Bearing Restrictions: Yes RLE Weight Bearing: Weight bearing as tolerated LLE Weight Bearing: Weight bearing as tolerated Mobility (including Balance) Bed Mobility Bed Mobility: Yes Supine to Sit: 5: Supervision;With rails;HOB elevated (Comment degrees) Supine to Sit Details (indicate cue type and reason): HOB 30*, verbal cues to use R LE to assist/support L LE off bed, increased time Sit to Supine - Right: 4: Min assist;HOB elevated (comment degrees) Sit to Supine - Right Details (indicate cue type and reason): HOB 30*, assist for L LE onto bed Transfers Transfers: Yes Sit to Stand: 4: Min assist;With upper extremity assist;From bed;From toilet Sit to Stand Details (indicate cue type and reason): verbal cues for L LE forward, assist from low toilet seat Stand to Sit: 4: Min assist;With upper extremity assist;To bed;To toilet Stand to Sit  Details: verbal cues for L LE forward and assist for controlling descent, verbal cue to use grab bar for toilet transfer Ambulation/Gait Ambulation/Gait: Yes Ambulation/Gait Assistance: 4: Min assist Ambulation/Gait Assistance Details (indicate cue type and reason): min/guard, verbal cues for safe RW distance, ambulated to bathroom and declined further ambulation 2* nausea Ambulation Distance (Feet): 28 Feet Assistive device: Rolling walker Gait Pattern: Step-to pattern;Decreased dorsiflexion - right;Decreased dorsiflexion - left;Antalgic Gait velocity: slow cadence    Exercise  Total Joint Exercises Ankle Circles/Pumps: AROM;Both;20 reps;Supine Quad Sets: AROM;Strengthening;Both;20 reps;Supine Short Arc Quad: AROM;Strengthening;Left;Other reps (comment);Supine (20 reps) Heel Slides: AAROM;Strengthening;Left;20 reps;Supine Hip ABduction/ADduction: AROM;Strengthening;Left;20 reps;Supine Knee Flexion:  (Pt with decreased ROM today.  Knee flexion AAROM 50 degrees.) End of Session PT - End of Session Equipment Utilized During Treatment: Left knee immobilizer Activity Tolerance: Patient limited by fatigue;Patient limited by pain (vomitting) Patient left: in bed;with call bell in reach;with family/visitor present (RN in room) Nurse Communication:  (RN alerted to vomitting and pain.) General Behavior During Session: [facility] for tasks performed Cognition: [facility] for tasks performed  Girolamo Lortie,[name] 11/19/2010, 3:59 PM

## 2010-11-19 NOTE — Progress Notes (Signed)
Physical Therapy Treatment Patient Details Name: MAHOGANI HOLOHAN MRN: [id number] DOB: [birth year] Today's Date: 11/19/2010 Time: 923-940 Charge: [name]  PT Assessment/Plan  PT - Assessment/Plan Comments on Treatment Session: Pt limited this AM 2* to nausea and assisted back to recliner after using bathroom.   RN aware of pt not feeling well. PT Plan: Discharge plan remains appropriate;Frequency remains appropriate Follow Up Recommendations: Home health PT Equipment Recommended: None recommended by PT PT Goals  Acute Rehab PT Goals PT Goal: Supine/Side to Sit - Progress: Not met PT Transfer Goal: Sit to Stand/Stand to Sit - Progress: Progressing toward goal PT Goal: Ambulate - Progress: Progressing toward goal  PT Treatment Precautions/Restrictions  Precautions Precautions: Knee Precaution Comments: no pillows under knee Required Braces or Orthoses: Yes Knee Immobilizer: Discontinue once straight leg raise with < 10 degree lag Restrictions Weight Bearing Restrictions: Yes RLE Weight Bearing: Weight bearing as tolerated LLE Weight Bearing: Weight bearing as tolerated Mobility (including Balance) Transfers Transfers: Yes Sit to Stand: 4: Min assist;From chair/3-in-1;From toilet;With armrests Sit to Stand Details (indicate cue type and reason): verbal cues for L LE forward, assist from low toilet seat Stand to Sit: 4: Min assist;With armrests;To chair/3-in-1;To toilet Stand to Sit Details: verbal cues for L LE forward and assist for controlling descent Ambulation/Gait Ambulation/Gait: Yes Ambulation/Gait Assistance: 4: Min assist Ambulation/Gait Assistance Details (indicate cue type and reason): min/guard, verbal cues for safe RW distance, ambulated to bathroom and declined further ambulation 2* nausea Ambulation Distance (Feet): 28 Feet (around room) Assistive device: Rolling walker Gait Pattern: Step-to pattern;Decreased dorsiflexion - right;Decreased dorsiflexion -  left;Antalgic Gait velocity: slow cadence    Exercise    End of Session PT - End of Session Equipment Utilized During Treatment: Left knee immobilizer Activity Tolerance: Patient limited by fatigue (Pt limited by nausea) Patient left: with call bell in reach;with family/visitor present;in chair Nurse Communication:  (nausea) General Behavior During Session: [facility] for tasks performed Cognition: [facility] for tasks performed  Keaston Pile,[name] 11/19/2010, 1:20 PM

## 2010-11-19 NOTE — Progress Notes (Signed)
Talked to Dr. Charlann Boxer by telephone. He is aware patients Hgb. 7.6 this am. States his PA will be by shortly. Clent Ridges  RN.

## 2010-11-19 NOTE — Progress Notes (Signed)
ANTICOAGULATION CONSULT NOTE - Follow Up Consult  Pharmacy Consult for Warfarin Indication: VTE prophylaxis with Hx of Afib  Allergies  Allergen Reactions  . Penicillins Rash    Patient Measurements: Height: 5\' 3"  (160 cm) Weight: 150 lb (68.04 kg) IBW/kg (Calculated) : 52.4   Vital Signs: Temp: 99 F (37.2 C) (11/15 0500) Temp src: Oral (11/15 0500) BP: 148/73 mmHg (11/15 0500) Pulse Rate: 70  (11/15 0500)  Labs:  Basename 11/19/10 0423 11/18/10 0405 11/17/10 1045  HGB 7.6* 8.2* --  HCT 24.0* 26.1* --  PLT 239 260 --  APTT -- -- 30  LABPROT 16.3* 13.9 13.7  INR 1.29 1.05 1.03  HEPARINUNFRC -- -- --  CREATININE 0.69 0.55 --  CKTOTAL -- -- --  CKMB -- -- --  TROPONINI -- -- --   Estimated Creatinine Clearance: 55.3 ml/min (by C-G formula based on Cr of 0.69).   Medications:  Scheduled:     . atenolol  25 mg Oral BID  . docusate sodium  100 mg Oral BID  . ferrous sulfate  325 mg Oral TID PC  . flecainide  100 mg Oral BID  . furosemide  20 mg Oral QAM  . levothyroxine  50 mcg Oral QAM  . loratadine  10 mg Oral Daily  . rosuvastatin  5 mg Oral QHS  . Vitamin D (Ergocalciferol)  50,000 Units Oral Weekly  . warfarin  5 mg Oral q1800   Infusions:     . sodium chloride 0.9 % 1,000 mL with potassium chloride 10 mEq infusion 100 mL/hr at 11/18/10 0541   PRN: alum & mag hydroxide-simeth, bisacodyl, bisacodyl, diphenhydrAMINE, HYDROcodone-acetaminophen, HYDROmorphone, magnesium hydroxide, meclizine, menthol-cetylpyridinium, methocarbamol(ROBAXIN) IV, methocarbamol, metoCLOPramide (REGLAN) injection, metoCLOPramide, ondansetron (ZOFRAN) IV, ondansetron, phenol, polyethylene glycol, polyvinyl alcohol, sodium phosphate, zolpidem   Patient's usual warfarin dosage is reportedly 5mg  daily.    Assessment: -77 yo F s/p L TKA -Re-initiation of warfarin in progress.  (Chronic anticoagulation for atrial fibrillation).   Goal of Therapy:  INR 2-3   Plan:  -Continue  warfarin 5mg  PO daily as ordered yesterday. -Follow INRs; home health to assist with obtaining these after discharge.  Elie Goody, Pharm.D.  [phone] 11/19/2010 12:57 PM

## 2010-11-20 MED ORDER — POLYETHYLENE GLYCOL 3350 17 G PO PACK: 17.0000 g | PACK | Freq: Two times a day (BID) | ORAL | Status: AC

## 2010-11-20 MED ORDER — METHOCARBAMOL 500 MG PO TABS: 500.0000 mg | ORAL_TABLET | Freq: Four times a day (QID) | ORAL | Status: AC | PRN

## 2010-11-20 MED ORDER — DSS 100 MG PO CAPS: 100.0000 mg | ORAL_CAPSULE | Freq: Two times a day (BID) | ORAL | Status: AC

## 2010-11-20 MED ORDER — FERROUS SULFATE 325 (65 FE) MG PO TABS: 325.0000 mg | ORAL_TABLET | Freq: Three times a day (TID) | ORAL | Status: DC

## 2010-11-20 MED ORDER — DIPHENHYDRAMINE HCL 25 MG PO CAPS: 25.0000 mg | ORAL_CAPSULE | Freq: Four times a day (QID) | ORAL | Status: AC | PRN

## 2010-11-20 MED ORDER — HYDROCODONE-ACETAMINOPHEN 7.5-325 MG PO TABS: 1.0000 | ORAL_TABLET | ORAL | Status: AC | PRN

## 2010-11-20 NOTE — Progress Notes (Signed)
Physical Therapy Treatment Patient Details Name: [name] MRN: [id number] DOB: 02-07-1932 Today's Date: 11/20/2010 Time: [phone] Charge: [name]  PT Assessment/Plan  PT - Assessment/Plan Comments on Treatment Session: Pt did very well today; able to progress ambulation distance and perform 1 step twice.  Pt plans to D/C home today with sister to assist as needed.  Pt had no questions or concerns.  Pt given knee exercises handout. PT Plan: Frequency remains appropriate;Discharge plan remains appropriate Follow Up Recommendations: Home health PT Equipment Recommended: None recommended by PT PT Goals  Acute Rehab PT Goals PT Transfer Goal: Sit to Stand/Stand to Sit - Progress: Progressing toward goal PT Goal: Ambulate - Progress: Progressing toward goal PT Goal: Up/Down Stairs - Progress: Progressing toward goal  PT Treatment Precautions/Restrictions  Precautions Precautions: Knee Precaution Comments: no pillows under knee Required Braces or Orthoses: Yes Knee Immobilizer: Discontinue once straight leg raise with < 10 degree lag Restrictions Weight Bearing Restrictions: Yes RLE Weight Bearing: Weight bearing as tolerated LLE Weight Bearing: Weight bearing as tolerated Mobility (including Balance) Bed Mobility Bed Mobility: No Transfers Transfers: Yes Sit to Stand: 4: Min assist;With armrests;From chair/3-in-1 Sit to Stand Details (indicate cue type and reason): min/guard, verbal cue for armrests Stand to Sit: 4: Min assist;To chair/3-in-1;With armrests Stand to Sit Details: min/guard, verbal cue for armrests Ambulation/Gait Ambulation/Gait: Yes Ambulation/Gait Assistance: 4: Min assist Ambulation/Gait Assistance Details (indicate cue type and reason): min/guard, 70 feet x2 (to stairwell and back to room) Ambulation Distance (Feet): 140 Feet Assistive device: Rolling walker Gait Pattern: Step-to pattern Gait velocity: slow cadence Stairs: Yes Stairs Assistance: 4: Min  assist Stairs Assistance Details (indicate cue type and reason): min/guard, verbal cues for sequence and RW placement, performed 1 step twice Stair Management Technique: Backwards;With walker Number of Stairs: 1     Exercise    End of Session PT - End of Session Equipment Utilized During Treatment: Gait belt;Left knee immobilizer Activity Tolerance: Patient tolerated treatment well Patient left: in chair;with call bell in reach;with family/visitor present General Behavior During Session: [name] for tasks performed Cognition: [name] for tasks performed  [name],[name] 11/20/2010, 10:58 AM

## 2010-11-20 NOTE — Discharge Summary (Signed)
Physician Discharge Summary  Patient ID: ANNE-MARIE GENSON MRN: [id number] DOB/AGE: 07-04-1932 77 y.o.  Admit date: 11/17/2010 Discharge date: 11/20/2010  Procedures:  TOTAL KNEE ARTHROPLASTY (LEFT)  Attending Physician: Shelda Pal MD  Admission Diagnoses: Left knee osteoarthritis/pain.  Discharge Diagnoses:  1. S/P Left total knee arthroplasty 2. Hypertension.  3. Hypercholesteremia.  4. Heart disease/coronary disease.  5. AFib.  6. Thyroid disease.  7. Degenerative disk disease.   HPI: The patient is a 77 year old white female in no acute distress. The patient states that she has had left knee pain for a couple of years. She has had a previous right total knee arthroplasty on May 26, 2010. She has done well since having this done. The patient does state, however, that her left knee pain has increased since that time. The patient feels that she has pain and most of the time feels as though it is going to give away on her. The patient has been taking Celebrex, which does help the pain somewhat. The patient has also had steroid injections, which has helped, but the pain quickly returned. X-rays in the clinic did show significant osteoarthritic changes of the left knee. Various options were discussed with the patient. The patient wished to proceed with surgery. Risks, benefits, and expectations of the procedure were discussed with the patient. The patient understands the risks, benefits, and expectations, and wishes to proceed with a left total knee arthroplasty.   PCP: Josue Hector, MD   Discharged Condition: good  Hospital Course:  Patient underwent the above stated procedure on 11/17/2010. Patient tolerated the procedure well and brought to the recovery room in good condition and subsequently to the floor.  POD #1 BP: 128/70 ; Pulse: 60 ; Temp: 98.4 F Pt's foley was removed, as well as the hemovac drain removed. IV was changed to a saline lock. Patient reports pain as  mild. Neurovascular intact, dorsiflexion/Plantar flexion intact, incision: dressing C/D/I, no cellulitis present and compartment soft.  LABS Basename  11/18/10 0405   HGB  8.2*   HCT  26.1*    POD #2  BP: 148/73 ; Pulse: 70 ; Temp: 99 F Patient reports pain as mild. C/o some nausea, believes it is from the pain medicine and getting behind on the pain. Feels like it is getting better. Neurovascular intact, dorsiflexion/Plantar flexion intact, incision: dressing C/D/I, no cellulitis present and compartment soft.  LABS  Basename  11/19/10 0423    HGB  7.6*    HCT  24.0*     POD #3  BP: 166/77 ; Pulse: 73 ; Temp: 99.3 F  Patient reports pain as moderate. Neurovascular intact, incision: scant drainage and decreased strength LLE in this post op period.   LABS  Basename  11/19/10 0423    HGB  7.6*    HCT  24.0*     Discharge Exam: Extremities: Homans sign is negative, no sign of DVT, no edema, redness or tenderness in the calves or thighs and no ulcers, gangrene or trophic changes  Disposition: Home with home health physical therapy Maintain surgical dressing for 8 days, then replace with gauze and tape. Keep the area dry and clean until follow up. Follow up in 2 weeks at [facility]. Call with any questions or concerns.  Discharge Orders    Future Orders Please Complete By Expires   Call MD / Call 911      Comments:   If you experience chest pain or shortness of breath, CALL 911 and be  transported to the hospital emergency room.  If you develope a fever above 101 F, pus (white drainage) or increased drainage or redness at the wound, or calf pain, call your surgeon's office.   Constipation Prevention      Comments:   Drink plenty of fluids.  Prune juice may be helpful.  You may use a stool softener, such as Colace (over the counter) 100 mg twice a day.  Use MiraLax (over the counter) for constipation as needed.   Increase activity slowly as tolerated      Weight Bearing as  taught in Physical Therapy      Comments:   Weight bearing as tolerated. Use a walker or crutches as instructed.    Discharge instructions      Comments:   Maintain surgical dressing for 8 days, then replace with gauze and tape. Keep the area dry and clean until follow up. Follow up in 2 weeks at [facility]. Call with any questions or concerns.     TED hose      Comments:   Use stockings (TED hose) for 2 weeks on both leg(s).  You may remove them at night for sleeping.   Change dressing      Comments:   Maintain surgical dressing for 8 days, then replace with gauze and tape. Keep the area dry and clean until follow up in 2 weeks.    Do not put a pillow under the knee. Place it under the heel.        Current Discharge Medication List    START taking these medications   Details  diphenhydrAMINE (BENADRYL) 25 mg capsule Take 1 capsule (25 mg total) by mouth every 6 (six) hours as needed for itching.    docusate sodium 100 MG CAPS Take 100 mg by mouth 2 (two) times daily.    ferrous sulfate 325 (65 FE) MG tablet Take 1 tablet (325 mg total) by mouth 3 (three) times daily after meals.    HYDROcodone-acetaminophen (NORCO) 7.5-325 MG per tablet Take 1-2 tablets by mouth every 4 (four) hours as needed for pain. Qty: 120 tablet, Refills: 0    methocarbamol (ROBAXIN) 500 MG tablet Take 1 tablet (500 mg total) by mouth every 6 (six) hours as needed.    polyethylene glycol (MIRALAX / GLYCOLAX) packet Take 17 g by mouth 2 (two) times daily. Qty: 14 each      CONTINUE these medications which have NOT CHANGED   Details  aspirin 81 MG tablet Take 81 mg by mouth at bedtime.     atenolol (TENORMIN) 50 MG tablet Take 25 mg by mouth 2 (two) times daily.     celecoxib (CELEBREX) 200 MG capsule Take 200 mg by mouth every morning.     CRESTOR 10 MG tablet Take 5 mg by mouth at bedtime.     ergocalciferol (VITAMIN D2) 50000 UNITS capsule Take 50,000 Units by mouth once a week. Pt  takes on sunday    flecainide (TAMBOCOR) 100 MG tablet Take 100 mg by mouth 2 (two) times daily.     furosemide (LASIX) 20 MG tablet Take 20 mg by mouth every morning.     levothyroxine (SYNTHROID, LEVOTHROID) 50 MCG tablet Take 50 mcg by mouth every morning.     Liniments (PENETRAN PLUS EX) Apply 1 application topically at bedtime.     loratadine (CLARITIN) 10 MG tablet Take 10 mg by mouth daily.     meclizine (ANTIVERT) 25 MG tablet Take 25 mg by  mouth daily as needed. Inner ear    Soft Lens Products (LUBRICATING & REWETTING) SOLN Place 2 drops into both eyes 2 (two) times daily.     vitamin B-12 (CYANOCOBALAMIN) 500 MCG tablet Take 500 mcg by mouth daily.     warfarin (COUMADIN) 5 MG tablet Take 5 mg by mouth every morning.     zolpidem (AMBIEN) 10 MG tablet Take 5 mg by mouth at bedtime.       STOP taking these medications     HYDROcodone-acetaminophen (VICODIN) 5-500 MG per tablet          Signed: Anastasio Auerbach. Adonica Fukushima   PAC  11/20/2010, 7:17 AM

## 2010-11-20 NOTE — Progress Notes (Signed)
  Subjective: 3 Days Post-Op Procedure(s) (LRB): TOTAL KNEE ARTHROPLASTY (Left)   Patient reports pain as moderate.  Objective:   VITALS:   Filed Vitals:   11/19/10 2200  BP: 166/77  Pulse: 73  Temp: 99.3 F (37.4 C)  Resp: 16    Neurovascular intact Incision: scant drainage decreased strength LLE in this post op period  LABS  Basename 11/19/10 0423 11/18/10 0405  HGB 7.6* 8.2*  HCT 24.0* 26.1*  WBC 10.3 11.2*  PLT 239 260     Basename 11/19/10 0423 11/18/10 0405  NA 130* 136  K 3.7 4.3  BUN 9 9  CREATININE 0.69 0.55  GLUCOSE 103* 148*     Basename 11/20/10 0430 11/19/10 0423  LABPT -- --  INR 1.25 1.29     Assessment/Plan: 3 Days Post-Op Procedure(s) (LRB): TOTAL KNEE ARTHROPLASTY (Left)   Up with therapy Discharge home with home health  RTC 2 weeks. May shower, remove Aquacell dressing next week (Wednesday) Call with questions

## 2010-11-20 NOTE — Plan of Care (Signed)
Problem: Phase III Progression Outcomes Goal: Ambulate BID Outcome: Completed/Met Date Met:  11/20/10 Patient ambulating in hallways with PT BID. Goal: Incision clean - minimal/no drainage Outcome: Completed/Met Date Met:  11/20/10 DDI  Left knee Goal: Discharge plan remains appropriate-arrangements made Outcome: Completed/Met Date Met:  11/20/10 DDI  Problem: Discharge Progression Outcomes Goal: Barriers To Progression Addressed/Resolved Outcome: Completed/Met Date Met:  11/20/10 Patient nausea has resolved. Goal: Ambulates safely using assistive device Outcome: Completed/Met Date Met:  11/20/10 Uses walker and has walker at home Goal: Follows weight - bearing limitations Outcome: Completed/Met Date Met:  11/20/10 WBAT Left knee

## 2011-07-20 ENCOUNTER — Telehealth: Payer: Self-pay | Admitting: Internal Medicine

## 2011-07-20 NOTE — Telephone Encounter (Signed)
Patient is scheduled to see Dr. Juanda Chance 08/03/11

## 2011-07-23 ENCOUNTER — Encounter: Payer: Self-pay | Admitting: *Deleted

## 2011-08-03 ENCOUNTER — Ambulatory Visit (INDEPENDENT_AMBULATORY_CARE_PROVIDER_SITE_OTHER): Payer: Medicare Other | Admitting: Internal Medicine

## 2011-08-03 ENCOUNTER — Other Ambulatory Visit (INDEPENDENT_AMBULATORY_CARE_PROVIDER_SITE_OTHER): Payer: Medicare Other

## 2011-08-03 ENCOUNTER — Encounter: Payer: Self-pay | Admitting: Internal Medicine

## 2011-08-03 VITALS — BP 148/70 | HR 56 | Ht 63.0 in | Wt 143.0 lb

## 2011-08-03 DIAGNOSIS — D509 Iron deficiency anemia, unspecified: Secondary | ICD-10-CM

## 2011-08-03 DIAGNOSIS — R109 Unspecified abdominal pain: Secondary | ICD-10-CM

## 2011-08-03 DIAGNOSIS — E611 Iron deficiency: Secondary | ICD-10-CM

## 2011-08-03 LAB — SEDIMENTATION RATE: Sed Rate: 24 mm/hr — ABNORMAL HIGH (ref 0–22)

## 2011-08-03 MED ORDER — DICYCLOMINE HCL 10 MG PO CAPS: 10.0000 mg | ORAL_CAPSULE | Freq: Two times a day (BID) | ORAL | Status: DC

## 2011-08-03 MED ORDER — RANITIDINE HCL 300 MG PO CAPS: 300.0000 mg | ORAL_CAPSULE | Freq: Every day | ORAL | Status: DC

## 2011-08-03 NOTE — Progress Notes (Signed)
[name] 1932/01/16 MRN [id number]   History of Present Illness:  This is a 78 year old white female with iron deficiency anemia and Hemoccult-negative stool. She complains of epigastric burning as well as urgent and frequent bowel movements. She has a history of severe diverticulosis of the left colon and an inflammatory polyp in the left colon. She had a diverticular bleed in the past. Her last colonoscopy in February 2012 done due to microcytic  anemia showed severe diverticulosis. An upper endoscopy at that time was essentially normal. Her hemoglobin is around 10.1-10.5 with an MCV of 79. She denies any visible blood per rectum. She has been on Coumadin for atrial fibrillation. She is intolerant to oral iron as well as to proton pump inhibitors. She is currently on Carafate slurry 10 cc by mouth 4 times a day.   Past Medical History  Diagnosis Date  . Atrial fibrillation     Paroxysmal, Flecainide therapy  . Warfarin anticoagulation   . Drug therapy     Flecainide for atrial fibrillation  . Hypothyroidism   . Carotid bruit     Doppler, December, 2009, no abnormality  . Ejection fraction     EF normal, echo, 2003  . Normal nuclear stress test     Normal, 2001  . Diverticulosis   . HLD (hyperlipidemia)   . HTN (hypertension)   . Osteoarthritis   . S/P knee replacement     Patient is status post right knee replacement   . Preop cardiovascular exam     Surgical clearance for left knee replacement if it is scheduled over the next many months  . Calf pain     September, 2012, at rest  . PONV (postoperative nausea and vomiting)   . GERD (gastroesophageal reflux disease)   . Anemia   . Internal hemorrhoid   . Hypothyroidism    Past Surgical History  Procedure Date  . Total knee arthroplasty 05/26/10    right  . Total shoulder replacement 01/2010    left  . Shoulder open rotator cuff repair     bilateral  . Total knee arthroplasty 11/17/2010    Procedure: TOTAL KNEE  ARTHROPLASTY;  Surgeon: Shelda Pal;  Location: [facility];  Service: Orthopedics;  Laterality: Left;  . Total knee arthroplasty     Right    reports that she has never smoked. She has never used smokeless tobacco. She reports that she does not drink alcohol or use illicit drugs. family history includes Colon cancer in her brother; Coronary artery disease in her brother; Heart attack in her daughter and father; Liver cancer in her brother; and Rheum arthritis in her mother. Allergies  Allergen Reactions  . Penicillins Rash        Review of Systems: Occasional dysphagia. Positive for heartburn and epigastric pain. Positive for diarrhea  The remainder of the 10 point ROS is negative except as outlined in H&P   Physical Exam: General appearance  Well developed, in no distress. Eyes- non icteric. HEENT nontraumatic, normocephalic. Mouth no lesions, tongue papillated, no cheilosis. Neck supple without adenopathy, thyroid not enlarged, no carotid bruits, no JVD. Lungs Clear to auscultation bilaterally. Cor normal S1, normal S2, , no murmur,  quiet precordium. Abdomen: Soft with normoactive bowel sounds. Tenderness left lower quadrant. No rebound. No distention. Liver edge at costal margin. Rectal: Soft Hemoccult negative stool. Extremities no pedal edema. Skin no lesions. Neurological alert and oriented x 3. Psychological normal mood and affect.  Assessment and Plan:  Problem #1 Abdominal pain localized to the epigastric and periumbilical area, partially relieved with Carafate. Patient is intolerant to PPIs. We will add ranitidine 300 mg at bedtime.  Problem #2 Iron deficiency Anemia with an iron saturation of 6% with Hemoccult negative stool. We will proceed with an iron infusion of Imfed at [facility] and we will obtain a CT scan of the abdomen and pelvis to look for inflammatory bowel disease or any process in the small bowel that would explain her persistent iron  deficiency.  Problem #3 Severe sigmoid diverticulosis documented on a recent colonoscopy. We will put her on Bentyl 10 mg twice a day as an antispasmodic. We will also check a sedimentation rate.   08/03/2011 Lina Sar

## 2011-08-03 NOTE — Patient Instructions (Addendum)
We have sent the following medications to your pharmacy for you to pick up at your convenience: Bentyl Ranitidine Your physician has requested that you go to the basement for the following lab work before leaving today: Celiac 10, Sed Rate  You have been scheduled for a CT scan of the abdomen and pelvis at [facility] ([address]---this is in the same building as [facility]).   You are scheduled on 08/04/11 at 3:00 pm. You should arrive 15 minutes prior to your appointment time for registration. Please follow the written instructions below on the day of your exam:  WARNING: IF YOU ARE ALLERGIC TO IODINE/X-RAY DYE, PLEASE NOTIFY RADIOLOGY IMMEDIATELY AT [phone]! YOU WILL BE GIVEN A 13 HOUR PREMEDICATION PREP.  1) Do not eat or drink anything after 11:00 am (4 hours prior to your test) 2) You have been given 2 bottles of oral contrast to drink. The solution may taste better if refrigerated, but do NOT add ice or any other liquid to this solution. Shake well before drinking.    Drink 1 bottle of contrast @ 1:00 pm (2 hours prior to your exam)  Drink 1 bottle of contrast @ 2:00 pm (1 hour prior to your exam)  You may take any medications as prescribed with a small amount of water except for the following: Metformin, Glucophage, Glucovance, Avandamet, Riomet, Fortamet, Actoplus Met, Janumet, Glumetza or Metaglip. The above medications must be held the day of the exam AND 48 hours after the exam.  The purpose of you drinking the oral contrast is to aid in the visualization of your intestinal tract. The contrast solution may cause some diarrhea. Before your exam is started, you will be given a small amount of fluid to drink. Depending on your individual set of symptoms, you may also receive an intravenous injection of x-ray contrast/dye. Plan on being at [facility] for 30 minutes or long, depending on the type of exam you are having performed.  If you have any  questions regarding your exam or if you need to reschedule, you may call the CT department at [phone] between the hours of 8:00 am and 5:00 pm, Monday-Friday.  ________________________________________________________________________ [name] have been scheduled for an iron infusion @ [facility] on Thursday, 08/05/11 @ 8:00 am. Please arrive at 7:30 am for registration. Bring a book or something to do as these infusions typically take several hours to complete. CC: Dr Joette Catching

## 2011-08-04 ENCOUNTER — Ambulatory Visit (INDEPENDENT_AMBULATORY_CARE_PROVIDER_SITE_OTHER)
Admission: RE | Admit: 2011-08-04 | Discharge: 2011-08-04 | Disposition: A | Discharge status: Home / Self Care | Payer: Medicare Other | Source: Ambulatory Visit | Attending: Internal Medicine | Admitting: Internal Medicine

## 2011-08-04 DIAGNOSIS — E611 Iron deficiency: Secondary | ICD-10-CM

## 2011-08-04 DIAGNOSIS — D509 Iron deficiency anemia, unspecified: Secondary | ICD-10-CM

## 2011-08-04 DIAGNOSIS — R109 Unspecified abdominal pain: Secondary | ICD-10-CM

## 2011-08-04 MED ORDER — IOHEXOL 300 MG/ML  SOLN
80.0000 mL | Freq: Once | INTRAMUSCULAR | Status: AC | PRN
  Administered 2011-08-04: 80 mL via INTRAVENOUS

## 2011-08-05 ENCOUNTER — Ambulatory Visit (HOSPITAL_COMMUNITY)
Admission: RE | Admit: 2011-08-05 | Disposition: A | Discharge status: Home / Self Care | Payer: Medicare Other | Source: Ambulatory Visit | Attending: Internal Medicine | Admitting: Internal Medicine

## 2011-08-05 DIAGNOSIS — D649 Anemia, unspecified: Secondary | ICD-10-CM | POA: Insufficient documentation

## 2011-08-05 LAB — CELIAC PANEL 10
Endomysial Screen: NEGATIVE
Tissue Transglut Ab: 9.6 U/mL (ref <20)

## 2011-08-05 MED ORDER — SODIUM CHLORIDE 0.9 % IV SOLN
1000.0000 mg | Freq: Once | INTRAVENOUS | Status: AC
  Administered 2011-08-05: 1000 mg via INTRAVENOUS
  Filled 2011-08-05: qty 20

## 2011-08-05 MED ORDER — SODIUM CHLORIDE 0.9 % IV SOLN
25.0000 mg | Freq: Once | INTRAVENOUS | Status: AC
  Administered 2011-08-05: 25 mg via INTRAVENOUS
  Filled 2011-08-05: qty 0.5

## 2011-09-27 ENCOUNTER — Ambulatory Visit: Payer: Medicare Other | Admitting: Cardiology

## 2011-10-04 ENCOUNTER — Encounter: Payer: Self-pay | Admitting: Cardiology

## 2011-10-04 ENCOUNTER — Ambulatory Visit (INDEPENDENT_AMBULATORY_CARE_PROVIDER_SITE_OTHER): Payer: Medicare Other | Admitting: Cardiology

## 2011-10-04 VITALS — BP 152/78 | HR 59 | Ht 63.0 in | Wt 143.0 lb

## 2011-10-04 DIAGNOSIS — E039 Hypothyroidism, unspecified: Secondary | ICD-10-CM

## 2011-10-04 DIAGNOSIS — Z79899 Other long term (current) drug therapy: Secondary | ICD-10-CM

## 2011-10-04 DIAGNOSIS — I4891 Unspecified atrial fibrillation: Secondary | ICD-10-CM

## 2011-10-04 DIAGNOSIS — Z96659 Presence of unspecified artificial knee joint: Secondary | ICD-10-CM

## 2011-10-04 DIAGNOSIS — Z7901 Long term (current) use of anticoagulants: Secondary | ICD-10-CM

## 2011-10-04 DIAGNOSIS — R0989 Other specified symptoms and signs involving the circulatory and respiratory systems: Secondary | ICD-10-CM

## 2011-10-04 NOTE — Patient Instructions (Addendum)
**Note De-Identified  Obfuscation** Your physician has requested that you have an echocardiogram. Echocardiography is a painless test that uses sound waves to create images of your heart. It provides your doctor with information about the size and shape of your heart and how well your heart's chambers and valves are working. This procedure takes approximately one hour. There are no restrictions for this procedure. This test will be performed at [facility].  Your physician wants you to follow-up in: 1 year. You will receive a reminder letter in the mail two months in advance. If you don't receive a letter, please call our office to schedule the follow-up appointment.

## 2011-10-04 NOTE — Progress Notes (Signed)
HPI  Patient is seen today to followup atrial fibrillation. I saw her last September, 2012. At that time we obtained a flecainide level. It was in the therapeutic range of 0.4. She continues to do well. We know that she has normal left ventricular function. However her last echo was done 2003. She's not having any chest pain.  Allergies  Allergen Reactions  . Penicillins Rash    Current Outpatient Prescriptions  Medication Sig Dispense Refill  . aspirin 81 MG tablet Take 81 mg by mouth at bedtime.       Marland Kitchen atenolol (TENORMIN) 50 MG tablet Take 25 mg by mouth 2 (two) times daily.       Marland Kitchen CARAFATE 1 GM/10ML suspension Uses as directed      . celecoxib (CELEBREX) 200 MG capsule Take 200 mg by mouth every morning.       Marland Kitchen CRESTOR 10 MG tablet Take 5 mg by mouth at bedtime.       . ergocalciferol (VITAMIN D2) 50000 UNITS capsule Take 50,000 Units by mouth once a week. Pt takes on sunday      . flecainide (TAMBOCOR) 100 MG tablet Take 100 mg by mouth 2 (two) times daily.       . furosemide (LASIX) 20 MG tablet Take 20 mg by mouth every morning.       Marland Kitchen levothyroxine (SYNTHROID, LEVOTHROID) 50 MCG tablet Take 50 mcg by mouth every morning.       . loratadine (CLARITIN) 10 MG tablet Take 10 mg by mouth daily.       . meclizine (ANTIVERT) 25 MG tablet Take 25 mg by mouth daily as needed. Inner ear      . OXYCODONE HCL PO Take 5 mg by mouth as directed.      . ranitidine (ZANTAC) 300 MG capsule Take 1 capsule (300 mg total) by mouth daily.  30 capsule  3  . Soft Lens Products (LUBRICATING & REWETTING) SOLN Place 2 drops into both eyes 2 (two) times daily.       Marland Kitchen warfarin (COUMADIN) 5 MG tablet Take 5 mg by mouth every morning.       . zolpidem (AMBIEN) 10 MG tablet Take 5 mg by mouth at bedtime.         History   Social History  . Marital Status: Married    Spouse Name: N/A    Number of Children: N/A  . Years of Education: N/A   Occupational History  . Not on file.   Social History  Main Topics  . Smoking status: Never Smoker   . Smokeless tobacco: Never Used  . Alcohol Use: No  . Drug Use: No  . Sexually Active: Not on file   Other Topics Concern  . Not on file   Social History Narrative  . No narrative on file    Family History  Problem Relation Age of Onset  . Coronary artery disease Brother   . Liver cancer Brother   . Colon cancer Brother   . Heart attack Father   . Rheum arthritis Mother   . Heart attack Daughter     Past Medical History  Diagnosis Date  . Atrial fibrillation     Paroxysmal, Flecainide therapy  . Warfarin anticoagulation   . Drug therapy     Flecainide for atrial fibrillation  . Hypothyroidism   . Carotid bruit     Doppler, December, 2009, no abnormality  . Ejection fraction  EF normal, echo, 2003  . Normal nuclear stress test     Normal, 2001  . Diverticulosis   . HLD (hyperlipidemia)   . HTN (hypertension)   . Osteoarthritis   . S/P knee replacement     Patient is status post right knee replacement   . Preop cardiovascular exam     Surgical clearance for left knee replacement if it is scheduled over the next many months  . Calf pain     September, 2012, at rest  . PONV (postoperative nausea and vomiting)   . GERD (gastroesophageal reflux disease)   . Anemia   . Internal hemorrhoid   . Hypothyroidism     Past Surgical History  Procedure Date  . Total knee arthroplasty 05/26/10    right  . Total shoulder replacement 01/2010    left  . Shoulder open rotator cuff repair     bilateral  . Total knee arthroplasty 11/17/2010    Procedure: TOTAL KNEE ARTHROPLASTY;  Surgeon: Shelda Pal;  Location: [facility];  Service: Orthopedics;  Laterality: Left;  . Total knee arthroplasty     Right    ROS   Patient denies fever, chills, headache, sweats, rash, change in vision, change in hearing, chest pain, cough, nausea vomiting, urinary symptoms. Patient says that she has had some intermittent vertigo. She's not having  it today. All other systems are reviewed and are negative.  PHYSICAL EXAM  Patient is overweight. There is no jugulovenous distention. Lungs are clear. Respiratory effort is nonlabored. Cardiac exam reveals S1 and S2. There no clicks or significant murmurs. The abdomen is soft. There is no peripheral edema. There are no musculoskeletal deformities. There are no skin rashes.  Filed Vitals:   10/04/11 1133  BP: 152/78  Pulse: 59  Height: 5\' 3"  (1.6 m)  Weight: 143 lb (64.864 kg)   EKG is done today and reviewed by me. I have compared it with her old tracing. She has normal sinus rhythm. There is mild decreased anterior R wave progression. There is no significant change.  ASSESSMENT & PLAN

## 2011-10-04 NOTE — Assessment & Plan Note (Signed)
The patient has had successful knee surgery. Her cardiac status has remained stable.

## 2011-10-04 NOTE — Assessment & Plan Note (Signed)
The patient is holding sinus rhythm. She's been on flecainide for many many years. He has now been 10 years since her last echo. It is important to be sure that she still has normal LV function. Two-dimensional echo will be done. It can be done in [place] as a will be easy for her to travel. I will still review the report. I will review the study as needed. We know her flecainide level was in the therapeutic range. We do not need to check this again.

## 2011-10-04 NOTE — Assessment & Plan Note (Signed)
The patient is on thyroid medication. This is overseen by her primary physician.

## 2011-10-04 NOTE — Assessment & Plan Note (Signed)
Patient continues on Coumadin. No change in therapy. 

## 2011-10-04 NOTE — Assessment & Plan Note (Signed)
Flecainide therapy will be continued. No change in therapy.

## 2011-10-04 NOTE — Assessment & Plan Note (Signed)
There was a carotid bruit in the past. Doppler in 2009 revealed no abnormality. She needs no further workup at this time.

## 2011-10-11 ENCOUNTER — Other Ambulatory Visit (HOSPITAL_COMMUNITY): Payer: Medicare Other

## 2011-10-12 ENCOUNTER — Ambulatory Visit (HOSPITAL_COMMUNITY)
Admission: RE | Admit: 2011-10-12 | Discharge: 2011-10-12 | Disposition: A | Discharge status: Home / Self Care | Payer: Medicare Other | Source: Ambulatory Visit | Attending: Cardiology | Admitting: Cardiology

## 2011-10-12 DIAGNOSIS — I059 Rheumatic mitral valve disease, unspecified: Secondary | ICD-10-CM

## 2011-10-12 DIAGNOSIS — I4891 Unspecified atrial fibrillation: Secondary | ICD-10-CM

## 2011-10-12 NOTE — Progress Notes (Signed)
*  PRELIMINARY RESULTS* Echocardiogram 2D Echocardiogram has been performed.  Conrad Impact 10/12/2011, 9:43 AM

## 2011-10-13 ENCOUNTER — Encounter: Payer: Self-pay | Admitting: Cardiology

## 2011-10-13 ENCOUNTER — Telehealth: Payer: Self-pay | Admitting: Cardiology

## 2011-10-13 NOTE — Telephone Encounter (Signed)
Echo results given to pt.

## 2011-10-13 NOTE — Telephone Encounter (Signed)
New problem    Returning call back to nurse.   

## 2011-12-07 ENCOUNTER — Other Ambulatory Visit: Payer: Self-pay | Admitting: *Deleted

## 2011-12-07 MED ORDER — RANITIDINE HCL 300 MG PO CAPS: 300.0000 mg | ORAL_CAPSULE | Freq: Every day | ORAL | Status: DC

## 2012-04-05 ENCOUNTER — Telehealth: Payer: Self-pay | Admitting: *Deleted

## 2012-04-05 MED ORDER — RANITIDINE HCL 300 MG PO CAPS: 300.0000 mg | ORAL_CAPSULE | Freq: Every day | ORAL | Status: DC

## 2012-04-05 NOTE — Telephone Encounter (Signed)
rx sent

## 2012-04-24 ENCOUNTER — Telehealth: Payer: Self-pay | Admitting: Cardiology

## 2012-04-24 DIAGNOSIS — E785 Hyperlipidemia, unspecified: Secondary | ICD-10-CM

## 2012-04-24 DIAGNOSIS — E039 Hypothyroidism, unspecified: Secondary | ICD-10-CM

## 2012-04-24 DIAGNOSIS — I4891 Unspecified atrial fibrillation: Secondary | ICD-10-CM

## 2012-04-24 DIAGNOSIS — Z5181 Encounter for therapeutic drug level monitoring: Secondary | ICD-10-CM

## 2012-04-24 NOTE — Telephone Encounter (Signed)
I spoke with the patient. She would like to have a flecainide level drawn. She does have a PCP, but does not recall any recent labs with her PCP. The patient is on synthroid. I advised I would forward this message to Dr. Myrtis Ser to see what he would like her to have done prior to her appointment in June with him. I will also forward to Tulia Via to follow up. The patient is agreeable.

## 2012-04-24 NOTE — Telephone Encounter (Signed)
Please arrange for the patient to have a trough flecainide level, TSH, BMet, and fasting lipid profile.

## 2012-04-24 NOTE — Telephone Encounter (Signed)
New Prob   Pt would like to get some lab work done for her appt in June. No orders in system.

## 2012-05-03 NOTE — Telephone Encounter (Signed)
Pt advised, she verbalized understanding. Labs to be drawn on 6/25.

## 2012-06-12 ENCOUNTER — Telehealth: Payer: Self-pay | Admitting: Cardiology

## 2012-06-12 NOTE — Telephone Encounter (Signed)
New Prob     Pt has some questions regarding recall appointment and what needs to be drawn for her lab work (pt will be having labs drawn in [place] and bringing results with her). Please call.

## 2012-06-12 NOTE — Telephone Encounter (Signed)
Left patient a message that labs that are needed have been ordered in [facility] and that if she has further questions she can call us back.

## 2012-06-13 ENCOUNTER — Telehealth: Payer: Self-pay | Admitting: Cardiology

## 2012-06-13 MED ORDER — ATENOLOL 25 MG PO TABS: 12.5000 mg | ORAL_TABLET | Freq: Two times a day (BID) | ORAL | Status: DC

## 2012-06-13 NOTE — Telephone Encounter (Signed)
New problem   Per pt she needs to be seen before 06/28/12

## 2012-06-13 NOTE — Telephone Encounter (Signed)
Pt has been having afib off and on x 1 week with increased weakness and sob, pt was seen at pcp today and hr was 45. Current vitals  bp 146/87 p58.  Pt on her own has increased flecainide, her usual dose is 100 mg BID, pt has been taking 300 mg for 3-4 days. Atenolol 25 mg bid and is on coumadin,  today's INR 2.9,  DOD Dr Patty Sermons reviewed chart, pt needs to reduce Atenolol to 12.5 mg bid, flecainide 100 mg bid and needs to be seen for possible cardioversion. App made tomorrow with Dr Graciela Husbands. Pt agreed to plan.

## 2012-06-14 ENCOUNTER — Encounter: Payer: Self-pay | Admitting: Internal Medicine

## 2012-06-14 ENCOUNTER — Ambulatory Visit (INDEPENDENT_AMBULATORY_CARE_PROVIDER_SITE_OTHER): Payer: Medicare Other | Admitting: Internal Medicine

## 2012-06-14 VITALS — BP 141/60 | HR 63 | Wt 136.0 lb

## 2012-06-14 DIAGNOSIS — I493 Ventricular premature depolarization: Secondary | ICD-10-CM | POA: Insufficient documentation

## 2012-06-14 DIAGNOSIS — R0989 Other specified symptoms and signs involving the circulatory and respiratory systems: Secondary | ICD-10-CM

## 2012-06-14 DIAGNOSIS — I4949 Other premature depolarization: Secondary | ICD-10-CM

## 2012-06-14 MED ORDER — MAGNESIUM OXIDE -MG SUPPLEMENT 400 (240 MG) MG PO TABS: 240.0000 mg | ORAL_TABLET | Freq: Every day | ORAL | Status: DC

## 2012-06-14 NOTE — Progress Notes (Signed)
Patient Care Team: Joette Catching, MD as PCP - General (Family Medicine) Shelda Pal, MD (Orthopedic Surgery)   HPI  [name] is a 79 y.o. female With a long-standing history of atrial fibrillation for which he is taking flecainide for many many years.  She comes in today with complaints of atrial fibrillation, specifically irregular heartbeats.  She's also had recent worsening of fatigue. She does not snore.  Past Medical History  Diagnosis Date  . Atrial fibrillation     Paroxysmal, Flecainide therapy  . Warfarin anticoagulation   . Drug therapy     Flecainide for atrial fibrillation  . Hypothyroidism   . Carotid bruit     Doppler, December, 2009, no abnormality  . Ejection fraction     EF normal, echo, 2003  . Normal nuclear stress test     Normal, 2001  . Diverticulosis   . HLD (hyperlipidemia)   . HTN (hypertension)   . Osteoarthritis   . S/P knee replacement     Patient is status post right knee replacement   . Preop cardiovascular exam     Surgical clearance for left knee replacement if it is scheduled over the next many months  . Calf pain     September, 2012, at rest  . PONV (postoperative nausea and vomiting)   . GERD (gastroesophageal reflux disease)   . Anemia   . Internal hemorrhoid   . Hypothyroidism     Past Surgical History  Procedure Laterality Date  . Total knee arthroplasty  05/26/10    right  . Total shoulder replacement  01/2010    left  . Shoulder open rotator cuff repair      bilateral  . Total knee arthroplasty  11/17/2010    Procedure: TOTAL KNEE ARTHROPLASTY;  Surgeon: Shelda Pal;  Location: [facility];  Service: Orthopedics;  Laterality: Left;  . Total knee arthroplasty      Right    Current Outpatient Prescriptions  Medication Sig Dispense Refill  . aspirin 81 MG tablet Take 81 mg by mouth at bedtime.       Marland Kitchen atenolol (TENORMIN) 25 MG tablet Take 0.5 tablets (12.5 mg total) by mouth 2 (two) times daily.  30 tablet  5  .  CARAFATE 1 GM/10ML suspension Uses as directed      . celecoxib (CELEBREX) 200 MG capsule Take 200 mg by mouth every morning.       Marland Kitchen CRESTOR 10 MG tablet Take 5 mg by mouth at bedtime.       . ergocalciferol (VITAMIN D2) 50000 UNITS capsule Take 50,000 Units by mouth once a week. Pt takes on sunday      . flecainide (TAMBOCOR) 100 MG tablet Take 100 mg by mouth 2 (two) times daily.       . furosemide (LASIX) 20 MG tablet Take 20 mg by mouth every morning.       Marland Kitchen levothyroxine (SYNTHROID, LEVOTHROID) 50 MCG tablet Take 50 mcg by mouth every morning.       . loratadine (CLARITIN) 10 MG tablet Take 10 mg by mouth daily.       . meclizine (ANTIVERT) 25 MG tablet Take 25 mg by mouth daily as needed. Inner ear      . OXYCODONE HCL PO Take 5 mg by mouth as directed.      . ranitidine (ZANTAC) 300 MG capsule Take 1 capsule (300 mg total) by mouth daily.  90 capsule  1  . Soft Lens  Products (LUBRICATING & REWETTING) SOLN Place 2 drops into both eyes 2 (two) times daily.       Marland Kitchen warfarin (COUMADIN) 5 MG tablet Take 5 mg by mouth every morning.       . zolpidem (AMBIEN) 10 MG tablet Take 5 mg by mouth at bedtime.       . potassium chloride SA (K-DUR,KLOR-CON) 20 MEQ tablet 1/2 tab po qd       No current facility-administered medications for this visit.    Allergies  Allergen Reactions  . Penicillins Rash    Review of Systems negative except from HPI and PMH  Physical Exam BP 141/60  Pulse 63  Wt 136 lb (61.689 kg)  BMI 24.1 kg/m2 Well developed and well nourished in no acute distress HENT normal E scleral and icterus clear Neck Supple JVP flat; carotids brisk and full Clear to ausculation regularly irregular  rate and rhythm, no murmurs gallops or rub Soft with active bowel sounds No clubbing cyanosis none Edema Alert and oriented, grossly normal motor and sensory function Skin Warm and Dry  ECG demonstrates sinus rhythm with frequent PVCs. Intervals 27/10-44 axis normal at 23   intervals unchanged from 2013  Assessment and  Plan

## 2012-06-14 NOTE — Assessment & Plan Note (Addendum)
The patient has PVCs which she mistook for atrial fibrillation. They're relatively infrequent. 10% on her ECG and review of all ECGs failed to demonstrate this. As there is concomitant fatigue, we will consult by Holter monitor and assess left ventricular function given the potential that they represent a burden greater than or equal to 10%  We will also give her mag oxide

## 2012-06-14 NOTE — Patient Instructions (Addendum)
Your physician has recommended that you wear a holter monitor. Holter monitors are medical devices that record the heart's electrical activity. Doctors most often use these monitors to diagnose arrhythmias. Arrhythmias are problems with the speed or rhythm of the heartbeat. The monitor is a small, portable device. You can wear one while you do your normal daily activities. This is usually used to diagnose what is causing palpitations/syncope (passing out).  Your physician has requested that you have an echocardiogram. Echocardiography is a painless test that uses sound waves to create images of your heart. It provides your doctor with information about the size and shape of your heart and how well your heart's chambers and valves are working. This procedure takes approximately one hour. There are no restrictions for this procedure.  Your physician has recommended you make the following change in your medication: start taking Magnesium 240 mg daily

## 2012-06-15 ENCOUNTER — Telehealth: Payer: Self-pay | Admitting: *Deleted

## 2012-06-15 NOTE — Telephone Encounter (Signed)
Left message for Ms. Trager to call concerning her lab appointment in [place]. Dr. Christell Constant office will stated Ms. Hickling will not be able to lab work done in their office because Ms. Hochberg is not a patient.

## 2012-06-21 ENCOUNTER — Encounter (INDEPENDENT_AMBULATORY_CARE_PROVIDER_SITE_OTHER): Payer: Medicare Other

## 2012-06-21 ENCOUNTER — Encounter: Payer: Self-pay | Admitting: *Deleted

## 2012-06-21 ENCOUNTER — Other Ambulatory Visit (INDEPENDENT_AMBULATORY_CARE_PROVIDER_SITE_OTHER): Payer: Medicare Other

## 2012-06-21 ENCOUNTER — Ambulatory Visit (HOSPITAL_COMMUNITY): Payer: Medicare Other | Attending: Cardiology | Admitting: Radiology

## 2012-06-21 DIAGNOSIS — Z5181 Encounter for therapeutic drug level monitoring: Secondary | ICD-10-CM

## 2012-06-21 DIAGNOSIS — I493 Ventricular premature depolarization: Secondary | ICD-10-CM

## 2012-06-21 DIAGNOSIS — I10 Essential (primary) hypertension: Secondary | ICD-10-CM | POA: Insufficient documentation

## 2012-06-21 DIAGNOSIS — I4949 Other premature depolarization: Secondary | ICD-10-CM | POA: Insufficient documentation

## 2012-06-21 DIAGNOSIS — E039 Hypothyroidism, unspecified: Secondary | ICD-10-CM | POA: Insufficient documentation

## 2012-06-21 DIAGNOSIS — I4891 Unspecified atrial fibrillation: Secondary | ICD-10-CM

## 2012-06-21 DIAGNOSIS — E785 Hyperlipidemia, unspecified: Secondary | ICD-10-CM

## 2012-06-21 DIAGNOSIS — R0989 Other specified symptoms and signs involving the circulatory and respiratory systems: Secondary | ICD-10-CM | POA: Insufficient documentation

## 2012-06-21 LAB — LIPID PANEL
Cholesterol: 181 mg/dL (ref 0–200)
HDL: 96.8 mg/dL (ref >39.00)
LDL Cholesterol: 70 mg/dL (ref 0–99)
Triglycerides: 71 mg/dL (ref 0.0–149.0)

## 2012-06-21 LAB — BASIC METABOLIC PANEL
BUN: 11 mg/dL (ref 6–23)
CO2: 24 mEq/L (ref 19–32)
GFR: 85.95 mL/min (ref >60.00)
Glucose, Bld: 104 mg/dL — ABNORMAL HIGH (ref 70–99)
Potassium: 4.6 mEq/L (ref 3.5–5.1)

## 2012-06-21 NOTE — Progress Notes (Signed)
Patient ID: [name], female   DOB: 1932-02-20, 79 y.o.   MRN: [id number] E-Cardio 24 hour holter monitor applied to patient.

## 2012-06-21 NOTE — Progress Notes (Signed)
Echocardiogram performed.  

## 2012-06-26 ENCOUNTER — Encounter: Payer: Self-pay | Admitting: Cardiology

## 2012-06-26 DIAGNOSIS — R9430 Abnormal result of cardiovascular function study, unspecified: Secondary | ICD-10-CM | POA: Insufficient documentation

## 2012-06-26 DIAGNOSIS — IMO0002 Reserved for concepts with insufficient information to code with codable children: Secondary | ICD-10-CM | POA: Insufficient documentation

## 2012-06-28 ENCOUNTER — Other Ambulatory Visit (HOSPITAL_COMMUNITY): Payer: Medicare Other

## 2012-06-28 ENCOUNTER — Other Ambulatory Visit: Payer: Medicare Other

## 2012-06-28 ENCOUNTER — Telehealth: Payer: Self-pay | Admitting: Internal Medicine

## 2012-06-28 ENCOUNTER — Encounter: Payer: Self-pay | Admitting: Cardiology

## 2012-06-28 ENCOUNTER — Ambulatory Visit: Payer: Medicare Other | Admitting: Cardiology

## 2012-06-28 ENCOUNTER — Ambulatory Visit (INDEPENDENT_AMBULATORY_CARE_PROVIDER_SITE_OTHER): Payer: Medicare Other | Admitting: Cardiology

## 2012-06-28 VITALS — BP 148/74 | HR 61 | Ht 63.0 in | Wt 136.8 lb

## 2012-06-28 DIAGNOSIS — I4949 Other premature depolarization: Secondary | ICD-10-CM

## 2012-06-28 DIAGNOSIS — I4891 Unspecified atrial fibrillation: Secondary | ICD-10-CM

## 2012-06-28 DIAGNOSIS — I493 Ventricular premature depolarization: Secondary | ICD-10-CM

## 2012-06-28 NOTE — Assessment & Plan Note (Signed)
At this point we have not proven any recurrent atrial fibrillation. Her flecainide level is still pending.

## 2012-06-28 NOTE — Telephone Encounter (Signed)
Walk In Pt Form " [employer]" paper dropped Off will hold onto until  Heather/Klein back In Office tomorrow 06/28/12/KM

## 2012-06-28 NOTE — Assessment & Plan Note (Signed)
The main issue recently is PVCs. We have shown that she does not have a high burden. We have shown that she has normal LV function. No further workup is needed. The patient is on 10 mEq of potassium. Her last potassium was normal. And the potassium will be stopped. Dr. Graciela Husbands put her on magnesium that will be continued. I will see her back for followup over several months to be sure that she is stable.

## 2012-06-28 NOTE — Patient Instructions (Addendum)
**Note De-Identified  Obfuscation** Your physician has recommended you make the following change in your medication: stop taking Potassium and continue to take Magnesium.  Your physician wants you to follow-up in: 4 months. You will receive a reminder letter in the mail two months in advance. If you don't receive a letter, please call our office to schedule the follow-up appointment.

## 2012-06-28 NOTE — Progress Notes (Signed)
HPI  The patient is seen to followup her palpitations. She saw Dr. Graciela Husbands. She was not having atrial fibrillation. She was sensing her PVCs. He started some oral magnesium. He ordered an echo to be sure that her LV function was good. I have reviewed this results in her ejection fraction is normal. There was also a 24-hour monitor to assess the burden of her PVCs. Her PVCs represent approximately 3% of her beats. Since this is less than 10% we will not pursue this issue any further. She's feeling well.  Allergies  Allergen Reactions  . Penicillins Rash    Current Outpatient Prescriptions  Medication Sig Dispense Refill  . atenolol (TENORMIN) 25 MG tablet Take 0.5 tablets (12.5 mg total) by mouth 2 (two) times daily.  30 tablet  5  . CARAFATE 1 GM/10ML suspension Uses as directed      . celecoxib (CELEBREX) 200 MG capsule Take 200 mg by mouth every morning.       Marland Kitchen CRESTOR 10 MG tablet Take 5 mg by mouth at bedtime.       . ergocalciferol (VITAMIN D2) 50000 UNITS capsule Take 50,000 Units by mouth once a week. Pt takes on sunday      . flecainide (TAMBOCOR) 100 MG tablet Take 100 mg by mouth 2 (two) times daily.       . furosemide (LASIX) 20 MG tablet Take 20 mg by mouth every morning.       Marland Kitchen levothyroxine (SYNTHROID, LEVOTHROID) 50 MCG tablet Take 50 mcg by mouth every morning.       . loratadine (CLARITIN) 10 MG tablet Take 10 mg by mouth daily.       . Magnesium Oxide 400 (240 MG) MG TABS Take 240 mg by mouth daily.  30 tablet  3  . meclizine (ANTIVERT) 25 MG tablet Take 25 mg by mouth daily as needed. Inner ear      . OXYCODONE HCL PO Take 5 mg by mouth as directed.      . potassium chloride SA (K-DUR,KLOR-CON) 20 MEQ tablet 1/2 tab po qd      . ranitidine (ZANTAC) 300 MG capsule Take 1 capsule (300 mg total) by mouth daily.  90 capsule  1  . Soft Lens Products (LUBRICATING & REWETTING) SOLN Place 2 drops into both eyes 2 (two) times daily.       Marland Kitchen warfarin (COUMADIN) 5 MG tablet Take  5 mg by mouth every morning.       . zolpidem (AMBIEN) 10 MG tablet Take 5 mg by mouth at bedtime.       . fluorometholone (FML) 0.1 % ophthalmic suspension       . LASTACAFT 0.25 % SOLN        No current facility-administered medications for this visit.    History   Social History  . Marital Status: Married    Spouse Name: N/A    Number of Children: N/A  . Years of Education: N/A   Occupational History  . Not on file.   Social History Main Topics  . Smoking status: Never Smoker   . Smokeless tobacco: Never Used  . Alcohol Use: No  . Drug Use: No  . Sexually Active: Not on file   Other Topics Concern  . Not on file   Social History Narrative  . No narrative on file    Family History  Problem Relation Age of Onset  . Coronary artery disease Brother   . Liver cancer  Brother   . Colon cancer Brother   . Heart attack Father   . Rheum arthritis Mother   . Heart attack Daughter     Past Medical History  Diagnosis Date  . Atrial fibrillation     Paroxysmal, Flecainide therapy  . Warfarin anticoagulation   . Drug therapy     Flecainide for atrial fibrillation  . Hypothyroidism   . Carotid bruit     Doppler, December, 2009, no abnormality  . Ejection fraction     EF normal, echo, 2003  //   EF 60-65%, echo, June 21, 2012, diastolic dysfunction,  . Normal nuclear stress test     Normal, 2001  . Diverticulosis   . HLD (hyperlipidemia)   . HTN (hypertension)   . Osteoarthritis   . S/P knee replacement     Patient is status post right knee replacement   . Preop cardiovascular exam     Surgical clearance for left knee replacement if it is scheduled over the next many months  . Calf pain     September, 2012, at rest  . PONV (postoperative nausea and vomiting)   . GERD (gastroesophageal reflux disease)   . Anemia   . Internal hemorrhoid   . Hypothyroidism     Past Surgical History  Procedure Laterality Date  . Total knee arthroplasty  05/26/10    right  .  Total shoulder replacement  01/2010    left  . Shoulder open rotator cuff repair      bilateral  . Total knee arthroplasty  11/17/2010    Procedure: TOTAL KNEE ARTHROPLASTY;  Surgeon: Shelda Pal;  Location: [facility];  Service: Orthopedics;  Laterality: Left;  . Total knee arthroplasty      Right    Patient Active Problem List   Diagnosis Date Noted  . Calf pain     Priority: High  . Atrial fibrillation     Priority: High  . Warfarin anticoagulation     Priority: High  . Hypothyroidism     Priority: High  . Carotid bruit     Priority: High  . Normal nuclear stress test     Priority: High  . Ejection fraction   . PVC (premature ventricular contraction) 06/14/2012  . S/P knee replacement   . UNSPECIFIED ANEMIA 01/19/2010  . DYSPEPSIA 01/19/2010  . DIVERTICULOSIS-COLON 01/19/2010  . ABDOMINAL PAIN RIGHT LOWER QUADRANT 01/19/2010  . ABDOMINAL PAIN-LLQ 01/19/2010  . ABDOMINAL PAIN, EPIGASTRIC 01/19/2010    ROS   Patient denies fever, chills, headache, sweats, rash, change in vision, change in hearing, chest pain, cough, nausea vomiting, urinary symptoms. All other systems are reviewed and are negative.  PHYSICAL EXAM   Patient is oriented to person time and place. Affect is normal. There is no jugulovenous distention. Lungs are clear. Respiratory effort is nonlabored. Cardiac exam reveals S1 and S2. There no clicks or significant murmurs. The abdomen is soft. There is no peripheral edema. Filed Vitals:   06/28/12 0904  BP: 148/74  Pulse: 61  Height: 5\' 3"  (1.6 m)  Weight: 136 lb 12.8 oz (62.052 kg)  SpO2: 95%     ASSESSMENT & PLAN

## 2012-09-27 ENCOUNTER — Other Ambulatory Visit: Payer: Self-pay | Admitting: Internal Medicine

## 2012-11-15 ENCOUNTER — Telehealth: Payer: Self-pay | Admitting: Cardiology

## 2012-11-15 NOTE — Telephone Encounter (Signed)
Left message for pt that I did not call.  I think she was called this AM to try to reschedule her appt since we had openings today in [place].

## 2012-11-15 NOTE — Telephone Encounter (Signed)
New Problem:  Pt states she missed and call and thinks it was her nurse calling. Pt would like to speak to [facility].

## 2012-12-06 ENCOUNTER — Encounter: Payer: Self-pay | Admitting: Cardiology

## 2012-12-06 ENCOUNTER — Ambulatory Visit (INDEPENDENT_AMBULATORY_CARE_PROVIDER_SITE_OTHER): Payer: Medicare Other | Admitting: Cardiology

## 2012-12-06 VITALS — BP 134/73 | HR 63 | Ht 63.0 in | Wt 135.0 lb

## 2012-12-06 DIAGNOSIS — I4891 Unspecified atrial fibrillation: Secondary | ICD-10-CM

## 2012-12-06 NOTE — Progress Notes (Signed)
HPI The patient has a history of atrial fibrillation and premature contractions. This is her first visit with me. She lives here and wants to be seen in the [place] [facility].  She says that she is doing well.   About a month ago she had some increased palpitations but this seemed to have settled down. She denies any chest pressure, neck or arm discomfort. She's not had any presyncope or syncope. She remains active. She said a month ago she restarted aspirin which had been discontinued by Dr. Graciela Husbands. She thought this was calming her palpitations.  Allergies  Allergen Reactions  . Penicillins Rash    Current Outpatient Prescriptions  Medication Sig Dispense Refill  . aspirin 81 MG tablet Take 81 mg by mouth daily.      Marland Kitchen atenolol (TENORMIN) 25 MG tablet Take 0.5 tablets (12.5 mg total) by mouth 2 (two) times daily.  30 tablet  5  . celecoxib (CELEBREX) 200 MG capsule Take 200 mg by mouth every morning.       Marland Kitchen CRESTOR 10 MG tablet Take 5 mg by mouth at bedtime.       . ergocalciferol (VITAMIN D2) 50000 UNITS capsule Take 50,000 Units by mouth once a week. Pt takes on sunday      . flecainide (TAMBOCOR) 100 MG tablet Take 100 mg by mouth 2 (two) times daily.       . furosemide (LASIX) 20 MG tablet Take 20 mg by mouth every morning.       Marland Kitchen levothyroxine (SYNTHROID, LEVOTHROID) 50 MCG tablet Take 50 mcg by mouth every morning.       . loratadine (CLARITIN) 10 MG tablet Take 10 mg by mouth daily.       . Magnesium Oxide 400 (240 MG) MG TABS Take 240 mg by mouth daily.  30 tablet  3  . meclizine (ANTIVERT) 25 MG tablet Take 25 mg by mouth daily as needed. Inner ear      . omeprazole (PRILOSEC) 40 MG capsule Take 40 mg by mouth daily.      . OXYCODONE HCL PO Take 5 mg by mouth as directed.      . potassium chloride SA (K-DUR,KLOR-CON) 20 MEQ tablet 20 mEq once.       . Soft Lens Products (LUBRICATING & REWETTING) SOLN Place 2 drops into both eyes 2 (two) times daily.       . vitamin B-12  (CYANOCOBALAMIN) 500 MCG tablet Take 500 mcg by mouth 2 (two) times daily.      Marland Kitchen warfarin (COUMADIN) 5 MG tablet Take 5 mg by mouth every morning.       . zolpidem (AMBIEN) 10 MG tablet Take 5 mg by mouth at bedtime.       . dicyclomine (BENTYL) 10 MG capsule TAKE  (1)  CAPSULE  TWICE DAILY.  60 capsule  0  . fluorometholone (FML) 0.1 % ophthalmic suspension       . LASTACAFT 0.25 % SOLN        No current facility-administered medications for this visit.    Past Medical History  Diagnosis Date  . Atrial fibrillation     Paroxysmal, Flecainide therapy  . Warfarin anticoagulation   . Drug therapy     Flecainide for atrial fibrillation  . Hypothyroidism   . Carotid bruit     Doppler, December, 2009, no abnormality  . Ejection fraction     EF normal, echo, 2003  //   EF 60-65%, echo,  June 21, 2012, diastolic dysfunction,  . Normal nuclear stress test     Normal, 2001  . Diverticulosis   . HLD (hyperlipidemia)   . HTN (hypertension)   . Osteoarthritis   . Preop cardiovascular exam   . Calf pain     September, 2012, at rest  . PONV (postoperative nausea and vomiting)   . GERD (gastroesophageal reflux disease)   . Anemia   . Internal hemorrhoid   . Hypothyroidism     Past Surgical History  Procedure Laterality Date  . Total knee arthroplasty  05/26/10    right  . Total shoulder replacement  01/2010    left  . Shoulder open rotator cuff repair      bilateral  . Total knee arthroplasty  11/17/2010    Procedure: TOTAL KNEE ARTHROPLASTY;  Surgeon: Shelda Pal;  Location: [facility];  Service: Orthopedics;  Laterality: Left;  . Total knee arthroplasty      Right    ROS:  As stated in the HPI and negative for all other systems.  PHYSICAL EXAM BP 134/73  Pulse 63  Ht 5\' 3"  (1.6 m)  Wt 135 lb (61.236 kg)  BMI 23.92 kg/m2 GENERAL:  Well appearing NECK:  No jugular venous distention, waveform within normal limits, carotid upstroke brisk and symmetric, no bruits, no  thyromegaly LUNGS:  Clear to auscultation bilaterally BACK:  No CVA tenderness CHEST:  Unremarkable HEART:  PMI not displaced or sustained,S1 and S2 within normal limits, no S3, no S4, no clicks, no rubs, no murmurs ABD:  Flat, positive bowel sounds normal in frequency in pitch, no bruits, no rebound, no guarding, no midline pulsatile mass, no hepatomegaly, no splenomegaly EXT:  2 plus pulses throughout, no edema, no cyanosis no clubbing  EKG:  Sinus rhythm, rate 61,  Axis within normal limits , first degree heart block, poor anterior R wave progression  , no acute ST-T wave changes. 12/06/2012  ASSESSMENT AND PLAN  PVCs:   These are about 3% by a Holter previously. He's not particularly symptomatic with them at this point. No change in therapy is indicated.  ATRIAL FIB:   I did tell her of the excess risk of taking an aspirin she will stop this as previously recommended. She will otherwise remain on the meds as listed.  HTN:  The blood pressure is at target. No change in medications is indicated. We will continue with therapeutic lifestyle changes (TLC).  (As this was my first appt. with the patient I extensively reviewed the previous records.   (Greater than reviewing all data with greater than 50% face to face with the patient).

## 2012-12-06 NOTE — Patient Instructions (Signed)
The current medical regimen is effective;  continue present plan and medications.  Follow up in 6 months with Dr Hochrein.  You will receive a letter in the mail 2 months before you are due.  Please call us when you receive this letter to schedule your follow up appointment.  

## 2013-03-08 ENCOUNTER — Other Ambulatory Visit: Payer: Self-pay | Admitting: Internal Medicine

## 2013-06-19 DIAGNOSIS — K589 Irritable bowel syndrome without diarrhea: Secondary | ICD-10-CM | POA: Insufficient documentation

## 2013-07-16 DIAGNOSIS — K649 Unspecified hemorrhoids: Secondary | ICD-10-CM | POA: Insufficient documentation

## 2013-08-10 ENCOUNTER — Emergency Department (HOSPITAL_COMMUNITY)
Admission: EM | Admit: 2013-08-10 | Discharge: 2013-08-10 | Disposition: A | Discharge status: Home / Self Care | Payer: Medicare Other | Attending: Emergency Medicine | Admitting: Emergency Medicine

## 2013-08-10 ENCOUNTER — Encounter (HOSPITAL_COMMUNITY): Payer: Self-pay | Admitting: Emergency Medicine

## 2013-08-10 ENCOUNTER — Emergency Department (HOSPITAL_COMMUNITY): Payer: Medicare Other

## 2013-08-10 DIAGNOSIS — I4891 Unspecified atrial fibrillation: Secondary | ICD-10-CM | POA: Insufficient documentation

## 2013-08-10 DIAGNOSIS — G459 Transient cerebral ischemic attack, unspecified: Secondary | ICD-10-CM | POA: Diagnosis not present

## 2013-08-10 DIAGNOSIS — Z862 Personal history of diseases of the blood and blood-forming organs and certain disorders involving the immune mechanism: Secondary | ICD-10-CM | POA: Diagnosis not present

## 2013-08-10 DIAGNOSIS — R109 Unspecified abdominal pain: Secondary | ICD-10-CM | POA: Diagnosis not present

## 2013-08-10 DIAGNOSIS — Z7901 Long term (current) use of anticoagulants: Secondary | ICD-10-CM | POA: Insufficient documentation

## 2013-08-10 DIAGNOSIS — Z7982 Long term (current) use of aspirin: Secondary | ICD-10-CM | POA: Diagnosis not present

## 2013-08-10 DIAGNOSIS — M1990 Unspecified osteoarthritis, unspecified site: Secondary | ICD-10-CM | POA: Diagnosis not present

## 2013-08-10 DIAGNOSIS — Z79899 Other long term (current) drug therapy: Secondary | ICD-10-CM | POA: Diagnosis not present

## 2013-08-10 DIAGNOSIS — I10 Essential (primary) hypertension: Secondary | ICD-10-CM | POA: Diagnosis not present

## 2013-08-10 DIAGNOSIS — E785 Hyperlipidemia, unspecified: Secondary | ICD-10-CM | POA: Insufficient documentation

## 2013-08-10 DIAGNOSIS — E039 Hypothyroidism, unspecified: Secondary | ICD-10-CM | POA: Diagnosis not present

## 2013-08-10 DIAGNOSIS — Z880 Allergy status to penicillin: Secondary | ICD-10-CM | POA: Diagnosis not present

## 2013-08-10 DIAGNOSIS — R42 Dizziness and giddiness: Secondary | ICD-10-CM | POA: Diagnosis not present

## 2013-08-10 DIAGNOSIS — K219 Gastro-esophageal reflux disease without esophagitis: Secondary | ICD-10-CM | POA: Diagnosis not present

## 2013-08-10 DIAGNOSIS — R51 Headache: Secondary | ICD-10-CM | POA: Insufficient documentation

## 2013-08-10 LAB — COMPREHENSIVE METABOLIC PANEL
ALBUMIN: 3.4 g/dL — AB (ref 3.5–5.2)
ALT: 10 U/L (ref 0–35)
AST: 19 U/L (ref 0–37)
Alkaline Phosphatase: 54 U/L (ref 39–117)
Anion gap: 14 (ref 5–15)
BILIRUBIN TOTAL: 0.3 mg/dL (ref 0.3–1.2)
BUN: 10 mg/dL (ref 6–23)
CO2: 25 mEq/L (ref 19–32)
Calcium: 8.5 mg/dL (ref 8.4–10.5)
Chloride: 93 mEq/L — ABNORMAL LOW (ref 96–112)
Creatinine, Ser: 0.77 mg/dL (ref 0.50–1.10)
GFR calc Af Amer: >90 mL/min (ref >90)
GFR, EST NON AFRICAN AMERICAN: 78 mL/min — AB (ref >90)
Glucose, Bld: 126 mg/dL — ABNORMAL HIGH (ref 70–99)
Potassium: 4.2 mEq/L (ref 3.7–5.3)
Sodium: 132 mEq/L — ABNORMAL LOW (ref 137–147)
Total Protein: 6.7 g/dL (ref 6.0–8.3)

## 2013-08-10 LAB — CBC WITH DIFFERENTIAL/PLATELET
BASOS ABS: 0 10*3/uL (ref 0.0–0.1)
BASOS PCT: 1 % (ref 0–1)
Eosinophils Absolute: 0.1 10*3/uL (ref 0.0–0.7)
Eosinophils Relative: 1 % (ref 0–5)
HCT: 37.3 % (ref 36.0–46.0)
Hemoglobin: 12.7 g/dL (ref 12.0–15.0)
Lymphocytes Relative: 22 % (ref 12–46)
Lymphs Abs: 1.4 10*3/uL (ref 0.7–4.0)
MCH: 31.3 pg (ref 26.0–34.0)
MCHC: 34 g/dL (ref 30.0–36.0)
MCV: 91.9 fL (ref 78.0–100.0)
Monocytes Absolute: 0.6 10*3/uL (ref 0.1–1.0)
Monocytes Relative: 10 % (ref 3–12)
NEUTROS ABS: 4 10*3/uL (ref 1.7–7.7)
Neutrophils Relative %: 66 % (ref 43–77)
PLATELETS: 273 10*3/uL (ref 150–400)
RBC: 4.06 MIL/uL (ref 3.87–5.11)
RDW: 13.5 % (ref 11.5–15.5)
WBC: 6 10*3/uL (ref 4.0–10.5)

## 2013-08-10 LAB — PROTIME-INR
INR: 2.62 — ABNORMAL HIGH (ref 0.00–1.49)
PROTHROMBIN TIME: 28 s — AB (ref 11.6–15.2)

## 2013-08-10 NOTE — ED Notes (Signed)
Having dizziness for two weeks.  Yesterday had trouble going words out.  Would say words but were not the words she was trying to say.

## 2013-08-10 NOTE — ED Notes (Signed)
Discharge instructions reviewed with pt, questions answered. Pt verbalized understanding.  

## 2013-08-10 NOTE — ED Provider Notes (Signed)
CSN: [id number]     Arrival date & time 08/10/13  1254 History  This chart was scribed for Terri Speak, MD by [name], ED Scribe. This patient was seen in room APA08/APA08 and the patient's care was started at 1:14 PM.   Chief Complaint  Patient presents with  . Dizziness  . Headache      Patient is a 80 y.o. female presenting with headaches. The history is provided by the patient and the spouse. No language interpreter was used.  Headache Associated symptoms: abdominal pain (yesterday, now resolved) and dizziness   Associated symptoms: no pain, no fever and no numbness    HPI Comments: [name] is a 80 y.o. female with a h/o HTN, A-fib, Carotid bruit, HLD, osteoarthritis, and hypothyroidism who presents to the Emergency Department complaining of episodic confusion that began 1 day ago. Patient states that she has been having difficulty "verbalizing what she wanted to say'. She states these episodes usually last for 5-10 minutes and then go away. She denies any history of heart disease, carotid artery disease, or DM. She currently takes medication for her hypothyroidism and Coumadin anticoagulant. She states she has had an associated left sided headache and dizziness. She states she had abdominal pain yesterday but that has now resolved. She states she took two ASA yesterday and 1 dose of Tylenol today. She denies any head injury. She denies any numbness, weakness, fever, chills, eye pain, visual disturbance, syncope, chest pain or shortness of breath.     Past Medical History  Diagnosis Date  . Atrial fibrillation     Paroxysmal, Flecainide therapy  . Warfarin anticoagulation   . Drug therapy     Flecainide for atrial fibrillation  . Hypothyroidism   . Carotid bruit     Doppler, December, 2009, no abnormality  . Ejection fraction     EF normal, echo, 2003  //   EF 60-65%, echo, June 22, 6759, diastolic dysfunction,  . Normal nuclear stress test     Normal, 2001  .  Diverticulosis   . HLD (hyperlipidemia)   . HTN (hypertension)   . Osteoarthritis   . Preop cardiovascular exam   . Calf pain     September, 2012, at rest  . PONV (postoperative nausea and vomiting)   . GERD (gastroesophageal reflux disease)   . Anemia   . Internal hemorrhoid   . Hypothyroidism    Past Surgical History  Procedure Laterality Date  . Total knee arthroplasty  05/26/10    right  . Total shoulder replacement  01/2010    left  . Shoulder open rotator cuff repair      bilateral  . Total knee arthroplasty  11/17/2010    Procedure: TOTAL KNEE ARTHROPLASTY;  Surgeon: Mauri Pole;  Location: [facility];  Service: Orthopedics;  Laterality: Left;  . Total knee arthroplasty      Right   Family History  Problem Relation Age of Onset  . Coronary artery disease Brother   . Liver cancer Brother   . Colon cancer Brother   . Heart attack Father   . Rheum arthritis Mother   . Heart attack Daughter    History  Substance Use Topics  . Smoking status: Never Smoker   . Smokeless tobacco: Never Used  . Alcohol Use: No   OB History   Grav Para Term Preterm Abortions TAB SAB Ect Mult Living  Review of Systems  Constitutional: Negative for fever and chills.  Eyes: Negative for pain and visual disturbance.  Respiratory: Negative for shortness of breath.   Cardiovascular: Negative for chest pain.  Gastrointestinal: Positive for abdominal pain (yesterday, now resolved).  Neurological: Positive for dizziness, speech difficulty ("difficulty verbalizing what she wanted to say"; episodic; 5-10 minutes) and headaches. Negative for syncope, weakness and numbness.  Psychiatric/Behavioral: Positive for confusion (increased confusion).   A complete 10 system review of systems was obtained and all systems are negative except as noted in the HPI and PMH.     Allergies  Penicillins  Home Medications   Prior to Admission medications   Medication Sig Start Date End  Date Taking? Authorizing Provider  aspirin 81 MG tablet Take 81 mg by mouth daily.    Historical Provider, MD  atenolol (TENORMIN) 25 MG tablet Take 0.5 tablets (12.5 mg total) by mouth 2 (two) times daily. 06/13/12   Carlena Bjornstad, MD  celecoxib (CELEBREX) 200 MG capsule Take 200 mg by mouth every morning.     Historical Provider, MD  CRESTOR 10 MG tablet Take 5 mg by mouth at bedtime.  06/22/10   Historical Provider, MD  dicyclomine (BENTYL) 10 MG capsule TAKE  (1)  CAPSULE  TWICE DAILY. 09/27/12   Lafayette Dragon, MD  ergocalciferol (VITAMIN D2) 50000 UNITS capsule Take 50,000 Units by mouth once a week. Pt takes on sunday    Historical Provider, MD  flecainide (TAMBOCOR) 100 MG tablet Take 100 mg by mouth 2 (two) times daily.     Historical Provider, MD  fluorometholone (FML) 0.1 % ophthalmic suspension  06/19/12   Historical Provider, MD  furosemide (LASIX) 20 MG tablet Take 20 mg by mouth every morning.     Historical Provider, MD  LASTACAFT 0.25 % SOLN  06/22/12   Historical Provider, MD  levothyroxine (SYNTHROID, LEVOTHROID) 50 MCG tablet Take 50 mcg by mouth every morning.     Historical Provider, MD  loratadine (CLARITIN) 10 MG tablet Take 10 mg by mouth daily.     Historical Provider, MD  Magnesium Oxide 400 (240 MG) MG TABS Take 240 mg by mouth daily. 06/14/12   Deboraha Sprang, MD  meclizine (ANTIVERT) 25 MG tablet Take 25 mg by mouth daily as needed. Inner ear    Historical Provider, MD  omeprazole (PRILOSEC) 40 MG capsule Take 40 mg by mouth daily.    Historical Provider, MD  OXYCODONE HCL PO Take 5 mg by mouth as directed.    Historical Provider, MD  potassium chloride SA (K-DUR,KLOR-CON) 20 MEQ tablet 20 mEq once.  10/19/12   Historical Provider, MD  Soft Lens Products (LUBRICATING & REWETTING) SOLN Place 2 drops into both eyes 2 (two) times daily.     Historical Provider, MD  vitamin B-12 (CYANOCOBALAMIN) 500 MCG tablet Take 500 mcg by mouth 2 (two) times daily.    Historical Provider,  MD  warfarin (COUMADIN) 5 MG tablet Take 5 mg by mouth every morning.     Historical Provider, MD  zolpidem (AMBIEN) 10 MG tablet Take 5 mg by mouth at bedtime.  08/25/10   Historical Provider, MD   Triage Vitals: BP 192/79  Pulse 66  Temp(Src) 98.7 F (37.1 C) (Oral)  Resp 20  Ht 5\' 3"  (1.6 m)  Wt 135 lb (61.236 kg)  BMI 23.92 kg/m2  SpO2 100%  Physical Exam  Nursing note and vitals reviewed. Constitutional: She is oriented to person, place, and time.  She appears well-developed and well-nourished. No distress.  HENT:  Head: Normocephalic and atraumatic.  Eyes: Conjunctivae and EOM are normal. Pupils are equal, round, and reactive to light.  Neck: Neck supple. No tracheal deviation present.  Cardiovascular: Normal rate, regular rhythm and normal heart sounds.  Exam reveals no gallop and no friction rub.   No murmur heard. Pulmonary/Chest: Effort normal and breath sounds normal. No respiratory distress. She has no wheezes. She has no rales. She exhibits no tenderness.  Abdominal: Soft. Bowel sounds are normal. She exhibits no mass. There is no tenderness. There is no rebound and no guarding.  Musculoskeletal: Normal range of motion.  Neurological: She is alert and oriented to person, place, and time. She displays normal reflexes. No cranial nerve deficit. She exhibits normal muscle tone. Coordination normal.  Skin: Skin is warm and dry.  Psychiatric: She has a normal mood and affect. Her behavior is normal.    ED Course  Procedures (including critical care time)  DIAGNOSTIC STUDIES: Oxygen Saturation is 100% on room air, normal by my interpretation.    COORDINATION OF CARE:    Labs Review Labs Reviewed - No data to display  Imaging Review No results found.   EKG Interpretation   Date/Time:  Friday August 10 2013 13:23:39 EDT Ventricular Rate:  67 PR Interval:  251 QRS Duration: 102 QT Interval:  434 QTC Calculation: 458 R Axis:   82 Text Interpretation:  Sinus  or ectopic atrial rhythm Prolonged PR interval  Anteroseptal infarct, old Confirmed by DELOS  MD, Juliahna Wiswell ([id number]) on  08/10/2013 1:38:08 PM      MDM   Final diagnoses:  None    Patient is a 80 year old female with history of atrial fibrillation, hypertension. She presents today with complaints of 3 episodes of expressive aphasia that occurred over the past 24 hours. Each has resolved within 10 minutes. Workup reveals a normal head CT, sinus rhythm on the EKG, unremarkable electrolytes, and INR which is therapeutic at 2.6.  She is neurologically intact and showing no deficits at present. She has no complaints. I have discussed the case with Dr. Merlene Laughter from neurology who feels as though admission for observation and carotid studies is indicated. I discussed this disposition with the patient who does not want to stay in the hospital. I will add a baby aspirin once daily to her anticoagulant regimen and set up carotid studies as an outpatient.  She understands the risks associated with being discharged, including permanent neurologic deficits and possibly death. She will return to the ER if her symptoms substantially worsen or change  I personally performed the services described in this documentation, which was scribed in my presence. The recorded information has been reviewed and is accurate.      Terri Speak, MD 08/10/13 1534

## 2013-08-10 NOTE — Discharge Instructions (Signed)
Begin taking an 81 mg baby aspirin once daily.  Return to radiology at the given time for ultrasound studies of your carotid arteries.  You're welcome to return at any time should you desire the hospitalization that was offered to you.   Transient Ischemic Attack A transient ischemic attack (TIA) is a "warning stroke" that causes stroke-like symptoms. Unlike a stroke, a TIA does not cause permanent damage to the brain. The symptoms of a TIA can happen very fast and do not last long. It is important to know the symptoms of a TIA and what to do. This can help prevent a major stroke or death. CAUSES   A TIA is caused by a temporary blockage in an artery in the brain or neck (carotid artery). The blockage does not allow the brain to get the blood supply it needs and can cause different symptoms. The blockage can be caused by either:  A blood clot.  Fatty buildup (plaque) in a neck or brain artery. RISK FACTORS  High blood pressure (hypertension).  High cholesterol.  Diabetes mellitus.  Heart disease.  The build up of plaque in the blood vessels (peripheral artery disease or atherosclerosis).  The build up of plaque in the blood vessels providing blood and oxygen to the brain (carotid artery stenosis).  An abnormal heart rhythm (atrial fibrillation).  Obesity.  Smoking.  Taking oral contraceptives (especially in combination with smoking).  Physical inactivity.  A diet high in fats, salt (sodium), and calories.  Alcohol use.  Use of illegal drugs (especially cocaine and methamphetamine).  Being female.  Being African American.  Being over the age of 87.  Family history of stroke.  Previous history of blood clots, stroke, TIA, or heart attack.  Sickle cell disease. SYMPTOMS  TIA symptoms are the same as a stroke but are temporary. These symptoms usually develop suddenly, or may be newly present upon awakening from sleep:  Sudden weakness or numbness of the face,  arm, or leg, especially on one side of the body.  Sudden trouble walking or difficulty moving arms or legs.  Sudden confusion.  Sudden personality changes.  Trouble speaking (aphasia) or understanding.  Difficulty swallowing.  Sudden trouble seeing in one or both eyes.  Double vision.  Dizziness.  Loss of balance or coordination.  Sudden severe headache with no known cause.  Trouble reading or writing.  Loss of bowel or bladder control.  Loss of consciousness. DIAGNOSIS  Your caregiver may be able to determine the presence or absence of a TIA based on your symptoms, history, and physical exam. Computed tomography (CT scan) of the brain is usually performed to help identify a TIA. Other tests may be done to diagnose a TIA. These tests may include:  Electrocardiography.  Continuous heart monitoring.  Echocardiography.  Carotid ultrasonography.  Magnetic resonance imaging (MRI).  A scan of the brain circulation.  Blood tests. PREVENTION  The risk of a TIA can be decreased by appropriately treating high blood pressure, high cholesterol, diabetes, heart disease, and obesity and by quitting smoking, limiting alcohol, and staying physically active. TREATMENT  Time is of the essence. Since the symptoms of TIA are the same as a stroke, it is important to seek treatment as soon as possible because you may need a medicine to dissolve the clot (thrombolytic) that cannot be given if too much time has passed. Treatment options vary. Treatment options may include rest, oxygen, intravenous (IV) fluids, and medicines to thin the blood (anticoagulants). Medicines and diet may  be used to address diabetes, high blood pressure, and other risk factors. Measures will be taken to prevent short-term and long-term complications, including infection from breathing foreign material into the lungs (aspiration pneumonia), blood clots in the legs, and falls. Treatment options include procedures to  either remove plaque in the carotid arteries or dilate carotid arteries that have narrowed due to plaque. Those procedures are:  Carotid endarterectomy.  Carotid angioplasty and stenting. HOME CARE INSTRUCTIONS   Take all medicines prescribed by your caregiver. Follow the directions carefully. Medicines may be used to control risk factors for a stroke. Be sure you understand all your medicine instructions.  You may be told to take aspirin or the anticoagulant warfarin. Warfarin needs to be taken exactly as instructed.  Taking too much or too little warfarin is dangerous. Too much warfarin increases the risk of bleeding. Too little warfarin continues to allow the risk for blood clots. While taking warfarin, you will need to have regular blood tests to measure your blood clotting time. A PT blood test measures how long it takes for blood to clot. Your PT is used to calculate another value called an INR. Your PT and INR help your caregiver to adjust your dose of warfarin. The dose can change for many reasons. It is critically important that you take warfarin exactly as prescribed.  Many foods, especially foods high in vitamin K can interfere with warfarin and affect the PT and INR. Foods high in vitamin K include spinach, kale, broccoli, cabbage, collard and turnip greens, brussels sprouts, peas, cauliflower, seaweed, and parsley as well as beef and pork liver, green tea, and soybean oil. You should eat a consistent amount of foods high in vitamin K. Avoid major changes in your diet, or notify your caregiver before changing your diet. Arrange a visit with a dietitian to answer your questions.  Many medicines can interfere with warfarin and affect the PT and INR. You must tell your caregiver about any and all medicines you take, this includes all vitamins and supplements. Be especially cautious with aspirin and anti-inflammatory medicines. Do not take or discontinue any prescribed or over-the-counter  medicine except on the advice of your caregiver or pharmacist.  Warfarin can have side effects, such as excessive bruising or bleeding. You will need to hold pressure over cuts for longer than usual. Your caregiver or pharmacist will discuss other potential side effects.  Avoid sports or activities that may cause injury or bleeding.  Be mindful when shaving, flossing your teeth, or handling sharp objects.  Alcohol can change the body's ability to handle warfarin. It is best to avoid alcoholic drinks or consume only very small amounts while taking warfarin. Notify your caregiver if you change your alcohol intake.  Notify your dentist or other caregivers before procedures.  Eat a diet that includes 5 or more servings of fruits and vegetables each day. This may reduce the risk of stroke. Certain diets may be prescribed to address high blood pressure, high cholesterol, diabetes, or obesity.  A low-sodium, low-saturated fat, low-trans fat, low-cholesterol diet is recommended to manage high blood pressure.  A low-saturated fat, low-trans fat, low-cholesterol, and high-fiber diet may control cholesterol levels.  A controlled-carbohydrate, controlled-sugar diet is recommended to manage diabetes.  A reduced-calorie, low-sodium, low-saturated fat, low-trans fat, low-cholesterol diet is recommended to manage obesity.  Maintain a healthy weight.  Stay physically active. It is recommended that you get at least 30 minutes of activity on most or all days.  Do not  smoke.  Limit alcohol use even if you are not taking warfarin. Moderate alcohol use is considered to be:  No more than 2 drinks each day for men.  No more than 1 drink each day for nonpregnant women.  Stop drug abuse.  Home safety. A safe home environment is important to reduce the risk of falls. Your caregiver may arrange for specialists to evaluate your home. Having grab bars in the bedroom and bathroom is often important. Your  caregiver may arrange for equipment to be used at home, such as raised toilets and a seat for the shower.  Follow all instructions for follow-up with your caregiver. This is very important. This includes any referrals and lab tests. Proper follow up can prevent a stroke or another TIA from occurring. SEEK MEDICAL CARE IF:  You have personality changes.  You have difficulty swallowing.  You are seeing double.  You have dizziness.  You have a fever.  You have skin breakdown. SEEK IMMEDIATE MEDICAL CARE IF:  Any of these symptoms may represent a serious problem that is an emergency. Do not wait to see if the symptoms will go away. Get medical help right away. Call your local emergency services (911 in [place] Do not drive yourself to the hospital.  You have sudden weakness or numbness of the face, arm, or leg, especially on one side of the body.  You have sudden trouble walking or difficulty moving arms or legs.  You have sudden confusion.  You have trouble speaking (aphasia) or understanding.  You have sudden trouble seeing in one or both eyes.  You have a loss of balance or coordination.  You have a sudden, severe headache with no known cause.  You have new chest pain or an irregular heartbeat.  You have a partial or total loss of consciousness. MAKE SURE YOU:   Understand these instructions.  Will watch your condition.  Will get help right away if you are not doing well or get worse. Document Released: 09/30/2004 Document Revised: 12/26/2012 Document Reviewed: 03/28/2013 [facility] Patient Information 2015 [place], [place]. This information is not intended to replace advice given to you by your health care provider. Make sure you discuss any questions you have with your health care provider.

## 2013-08-13 ENCOUNTER — Ambulatory Visit (HOSPITAL_COMMUNITY)
Admission: RE | Admit: 2013-08-13 | Discharge: 2013-08-13 | Disposition: A | Discharge status: Home / Self Care | Payer: Medicare Other | Source: Ambulatory Visit | Attending: Emergency Medicine | Admitting: Emergency Medicine

## 2013-08-13 DIAGNOSIS — R4701 Aphasia: Secondary | ICD-10-CM | POA: Insufficient documentation

## 2013-08-13 NOTE — ED Provider Notes (Signed)
Pt returned for carotid duplex Pt without any significant findings She denies any new complaints and no new episodes of aphasia She is awake/alert, no distress I stressed importance of close PCP f/u (24 hours) for further workup/evaluation Also, advised pt to return at anytime to the ER if she chooses for further workup/evaluation   Sharyon Cable, MD 08/13/13 1307

## 2013-08-22 ENCOUNTER — Inpatient Hospital Stay (HOSPITAL_COMMUNITY): Admission: RE | Admit: 2013-08-22 | Payer: Medicare Other | Source: Ambulatory Visit

## 2013-09-06 ENCOUNTER — Other Ambulatory Visit: Payer: Self-pay | Admitting: Neurology

## 2013-09-06 DIAGNOSIS — G459 Transient cerebral ischemic attack, unspecified: Secondary | ICD-10-CM

## 2013-09-18 ENCOUNTER — Ambulatory Visit (HOSPITAL_COMMUNITY)
Admission: RE | Admit: 2013-09-18 | Discharge: 2013-09-18 | Disposition: A | Discharge status: Home / Self Care | Payer: Medicare Other | Source: Ambulatory Visit | Attending: Neurology | Admitting: Neurology

## 2013-09-18 DIAGNOSIS — R42 Dizziness and giddiness: Secondary | ICD-10-CM | POA: Insufficient documentation

## 2013-09-18 DIAGNOSIS — G459 Transient cerebral ischemic attack, unspecified: Secondary | ICD-10-CM

## 2013-10-15 ENCOUNTER — Encounter (HOSPITAL_COMMUNITY): Payer: Self-pay | Admitting: Emergency Medicine

## 2013-10-15 ENCOUNTER — Emergency Department (HOSPITAL_COMMUNITY): Payer: Medicare Other

## 2013-10-15 ENCOUNTER — Emergency Department (HOSPITAL_COMMUNITY)
Admission: EM | Admit: 2013-10-15 | Discharge: 2013-10-15 | Disposition: A | Discharge status: Home / Self Care | Payer: Medicare Other | Attending: Emergency Medicine | Admitting: Emergency Medicine

## 2013-10-15 DIAGNOSIS — I4891 Unspecified atrial fibrillation: Secondary | ICD-10-CM | POA: Diagnosis not present

## 2013-10-15 DIAGNOSIS — Z862 Personal history of diseases of the blood and blood-forming organs and certain disorders involving the immune mechanism: Secondary | ICD-10-CM | POA: Insufficient documentation

## 2013-10-15 DIAGNOSIS — I10 Essential (primary) hypertension: Secondary | ICD-10-CM | POA: Diagnosis not present

## 2013-10-15 DIAGNOSIS — Z7901 Long term (current) use of anticoagulants: Secondary | ICD-10-CM | POA: Insufficient documentation

## 2013-10-15 DIAGNOSIS — E785 Hyperlipidemia, unspecified: Secondary | ICD-10-CM | POA: Diagnosis not present

## 2013-10-15 DIAGNOSIS — Z880 Allergy status to penicillin: Secondary | ICD-10-CM | POA: Insufficient documentation

## 2013-10-15 DIAGNOSIS — Z7982 Long term (current) use of aspirin: Secondary | ICD-10-CM | POA: Diagnosis not present

## 2013-10-15 DIAGNOSIS — E039 Hypothyroidism, unspecified: Secondary | ICD-10-CM | POA: Insufficient documentation

## 2013-10-15 DIAGNOSIS — Z792 Long term (current) use of antibiotics: Secondary | ICD-10-CM | POA: Diagnosis not present

## 2013-10-15 DIAGNOSIS — M25551 Pain in right hip: Secondary | ICD-10-CM | POA: Diagnosis present

## 2013-10-15 DIAGNOSIS — M71551 Other bursitis, not elsewhere classified, right hip: Secondary | ICD-10-CM | POA: Diagnosis not present

## 2013-10-15 DIAGNOSIS — Z79899 Other long term (current) drug therapy: Secondary | ICD-10-CM | POA: Diagnosis not present

## 2013-10-15 DIAGNOSIS — M7071 Other bursitis of hip, right hip: Secondary | ICD-10-CM

## 2013-10-15 DIAGNOSIS — K219 Gastro-esophageal reflux disease without esophagitis: Secondary | ICD-10-CM | POA: Diagnosis not present

## 2013-10-15 MED ORDER — HYDROCODONE-ACETAMINOPHEN 5-325 MG PO TABS: ORAL_TABLET | ORAL | Status: DC

## 2013-10-15 NOTE — Discharge Instructions (Signed)
Hip Bursitis Bursitis is a puffiness (swelling) and soreness of a fluid-filled sac (bursa). This sac covers and protects the joint. HOME CARE  Put ice on the injured area.  Put ice in a plastic bag.  Place a towel between your skin and the bag.  Leave the ice on for 15-20 minutes, 03-04 times a day.  Rest the painful joint as much as possible. Move your joint at least 4 times a day. When pain lessens, start normal, slow movements and normal activities.  Only take medicine as told by your doctor.  Use crutches as told.  Raise (elevate) your painful joint. Use pillows for propping your legs and hips.  Get a massage to lessen pain. GET HELP RIGHT AWAY IF:  Your pain increases or does not improve during treatment.  You have a fever.  You feel heat coming from the affected area.  You see redness and puffiness around the affected area.  You have any questions or concerns. MAKE SURE YOU:  Understand these instructions.  Will watch your condition.  Will get help right away if you are not well or get worse. Document Released: 01/23/2010 Document Revised: 03/15/2011 Document Reviewed: 01/23/2010 [facility] Patient Information 2015 [place], [place]. This information is not intended to replace advice given to you by your health care provider. Make sure you discuss any questions you have with your health care provider.  Hip Pain Your hip is the joint between your upper legs and your lower pelvis. The bones, cartilage, tendons, and muscles of your hip joint perform a lot of work each day supporting your body weight and allowing you to move around. Hip pain can range from a minor ache to severe pain in one or both of your hips. Pain may be felt on the inside of the hip joint near the groin, or the outside near the buttocks and upper thigh. You may have swelling or stiffness as well.  HOME CARE INSTRUCTIONS   Take medicines only as directed by your health care provider.  Apply ice to  the injured area:  Put ice in a plastic bag.  Place a towel between your skin and the bag.  Leave the ice on for 15-20 minutes at a time, 3-4 times a day.  Keep your leg raised (elevated) when possible to lessen swelling.  Avoid activities that cause pain.  Follow specific exercises as directed by your health care provider.  Sleep with a pillow between your legs on your most comfortable side.  Record how often you have hip pain, the location of the pain, and what it feels like. SEEK MEDICAL CARE IF:   You are unable to put weight on your leg.  Your hip is red or swollen or very tender to touch.  Your pain or swelling continues or worsens after 1 week.  You have increasing difficulty walking.  You have a fever. SEEK IMMEDIATE MEDICAL CARE IF:   You have fallen.  You have a sudden increase in pain and swelling in your hip. MAKE SURE YOU:   Understand these instructions.  Will watch your condition.  Will get help right away if you are not doing well or get worse. Document Released: 06/10/2009 Document Revised: 05/07/2013 Document Reviewed: 08/17/2012 [facility] Patient Information 2015 [place], [place]. This information is not intended to replace advice given to you by your health care provider. Make sure you discuss any questions you have with your health care provider.

## 2013-10-15 NOTE — ED Notes (Signed)
Rt hip pain for 3-4 days, no injury ,

## 2013-10-15 NOTE — ED Notes (Addendum)
Patient complaining of right hip pain x 3 days. Denies injury. Patient ambulatory in triage, states "I feel like I am going to fall. It feels like it is going to give out on me."

## 2013-10-15 NOTE — ED Provider Notes (Signed)
CSN: [id number]     Arrival date & time 10/15/13  1502 History  This chart was scribed for non-physician practitioner Kem Parkinson, PA-C working with Nat Christen, MD by Zola Button, ED Scribe. This patient was seen in room APFT20/APFT20 and the patient's care was started at 5:37 PM.     Chief Complaint  Patient presents with  . Hip Pain      The history is provided by the patient. No language interpreter was used.   HPI Comments: [name] is a 80 y.o. female who presents to the Emergency Department complaining of gradual onset, constant right hip pain that began 3 days ago. Patient states that the pain feels like her bones are grinding, and that she has to stand for a while before she can walk. The pain is worse with movement and improves at rest. She notes mild relief with heat. Patient notes occasional numbness and tingling in her hip with excessive walking. She denies redness and swelling in her hip or abdominal pain. Patient denies fall, injury or any problems with her hip previously.   PCP: Dr. Edrick Oh  Past Medical History  Diagnosis Date  . Atrial fibrillation     Paroxysmal, Flecainide therapy  . Warfarin anticoagulation   . Drug therapy     Flecainide for atrial fibrillation  . Hypothyroidism   . Carotid bruit     Doppler, December, 2009, no abnormality  . Ejection fraction     EF normal, echo, 2003  //   EF 60-65%, echo, June 22, 9831, diastolic dysfunction,  . Normal nuclear stress test     Normal, 2001  . Diverticulosis   . HLD (hyperlipidemia)   . HTN (hypertension)   . Osteoarthritis   . Preop cardiovascular exam   . Calf pain     September, 2012, at rest  . PONV (postoperative nausea and vomiting)   . GERD (gastroesophageal reflux disease)   . Anemia   . Internal hemorrhoid   . Hypothyroidism    Past Surgical History  Procedure Laterality Date  . Total knee arthroplasty  05/26/10    right  . Total shoulder replacement  01/2010    left  . Shoulder  open rotator cuff repair      bilateral  . Total knee arthroplasty  11/17/2010    Procedure: TOTAL KNEE ARTHROPLASTY;  Surgeon: Mauri Pole;  Location: [facility];  Service: Orthopedics;  Laterality: Left;  . Total knee arthroplasty      Right   Family History  Problem Relation Age of Onset  . Coronary artery disease Brother   . Liver cancer Brother   . Colon cancer Brother   . Heart attack Father   . Rheum arthritis Mother   . Heart attack Daughter    History  Substance Use Topics  . Smoking status: Never Smoker   . Smokeless tobacco: Never Used  . Alcohol Use: No   OB History   Grav Para Term Preterm Abortions TAB SAB Ect Mult Living                 Review of Systems  Constitutional: Negative for fever, chills, appetite change and fatigue.  Respiratory: Negative for cough and shortness of breath.   Cardiovascular: Negative for chest pain.  Gastrointestinal: Negative for vomiting, abdominal pain and diarrhea.  Genitourinary: Negative for dysuria, frequency, hematuria and flank pain.  Musculoskeletal: Positive for arthralgias. Negative for back pain and joint swelling.  Skin: Negative for rash.  Neurological:  Negative for dizziness, weakness, numbness and headaches.  Psychiatric/Behavioral: Negative for hallucinations.  All other systems reviewed and are negative.     Allergies  Penicillins  Home Medications   Prior to Admission medications   Medication Sig Start Date End Date Taking? Authorizing Provider  acetaminophen (TYLENOL) 500 MG tablet Take 500 mg by mouth every 6 (six) hours as needed for moderate pain.    Historical Provider, MD  aspirin 81 MG tablet Take 162 mg by mouth daily.     Historical Provider, MD  atenolol (TENORMIN) 25 MG tablet Take 12.5 mg by mouth daily.    Historical Provider, MD  celecoxib (CELEBREX) 200 MG capsule Take 200 mg by mouth every morning.     Historical Provider, MD  Coenzyme Q10 (CO Q 10 PO) Take 1 tablet by mouth daily.     Historical Provider, MD  CRESTOR 10 MG tablet Take 5 mg by mouth at bedtime.  06/22/10   Historical Provider, MD  dicyclomine (BENTYL) 10 MG capsule Take 10 mg by mouth daily as needed (upset stomach).    Historical Provider, MD  ergocalciferol (VITAMIN D2) 50000 UNITS capsule Take 50,000 Units by mouth once a week. Pt takes on sunday    Historical Provider, MD  flecainide (TAMBOCOR) 100 MG tablet Take 100 mg by mouth 2 (two) times daily.     Historical Provider, MD  furosemide (LASIX) 20 MG tablet Take 20 mg by mouth every morning.     Historical Provider, MD  GARLIC PO Take 1 tablet by mouth daily.    Historical Provider, MD  levothyroxine (SYNTHROID, LEVOTHROID) 50 MCG tablet Take 50 mcg by mouth every morning.     Historical Provider, MD  loratadine (CLARITIN) 10 MG tablet Take 10 mg by mouth daily.     Historical Provider, MD  Magnesium Oxide 400 (240 MG) MG TABS Take 240 mg by mouth daily. 06/14/12   Deboraha Sprang, MD  meclizine (ANTIVERT) 25 MG tablet Take 25 mg by mouth daily as needed. Inner ear    Historical Provider, MD  omeprazole (PRILOSEC) 40 MG capsule Take 40 mg by mouth daily as needed (acid reflux).     Historical Provider, MD  oxyCODONE-acetaminophen (PERCOCET/ROXICET) 5-325 MG per tablet Take 0.5 tablets by mouth daily as needed for moderate pain or severe pain.    Historical Provider, MD  potassium chloride SA (K-DUR,KLOR-CON) 20 MEQ tablet Take 20 mEq by mouth daily.  10/19/12   Historical Provider, MD  Soft Lens Products (LUBRICATING & REWETTING) SOLN Place 2 drops into both eyes 2 (two) times daily.     Historical Provider, MD  vitamin B-12 (CYANOCOBALAMIN) 500 MCG tablet Take 500 mcg by mouth 2 (two) times daily.    Historical Provider, MD  VITAMIN E PO Take 1 tablet by mouth daily.    Historical Provider, MD  warfarin (COUMADIN) 5 MG tablet Take 5-7.5 mg by mouth every morning. 7.5mg  on Wednesday and Saturday and 5 mg all other days.    Historical Provider, MD  zolpidem  (AMBIEN) 10 MG tablet Take 5 mg by mouth at bedtime.  08/25/10   Historical Provider, MD   BP 164/75  Pulse 59  Temp(Src) 97.9 F (36.6 C) (Oral)  Ht 5\' 3"  (1.6 m)  Wt 130 lb (58.968 kg)  BMI 23.03 kg/m2  SpO2 98% Physical Exam  Nursing note and vitals reviewed. Constitutional: She is oriented to person, place, and time. She appears well-developed and well-nourished. No distress.  HENT:  Head: Normocephalic and atraumatic.  Neck: Neck supple.  Cardiovascular: Normal rate, regular rhythm, normal heart sounds and intact distal pulses.   No murmur heard. Pulmonary/Chest: Effort normal and breath sounds normal. No respiratory distress.  Abdominal: Soft. She exhibits no distension. There is no tenderness. There is no rebound and no guarding.  Musculoskeletal: Normal range of motion. She exhibits no edema.  Tenderness is reproduced with external rotation of right hip, no bony deformity. DP pulse and distal sensation intact. Patient has full ROM of the hip.  Neurological: She is alert and oriented to person, place, and time. No cranial nerve deficit.  Skin: Skin is warm and dry. No rash noted.  Psychiatric: She has a normal mood and affect. Her behavior is normal.    ED Course  Procedures  DIAGNOSTIC STUDIES: Oxygen Saturation is 98% on RA, nml by my interpretation.    COORDINATION OF CARE: 5:46 PM-Discussed treatment plan which includes pain medication with pt at bedside and pt agreed to plan.   Labs Review Labs Reviewed - No data to display  Imaging Review Dg Hip Complete Right  10/15/2013   CLINICAL DATA:  RIGHT hip stiffness for 3 days.  EXAM: RIGHT HIP - COMPLETE 2+ VIEW  COMPARISON:  None.  FINDINGS: There is no evidence of hip fracture or dislocation. There is no evidence of erosive arthropathy or other focal bone abnormality. Moderate degenerative change with joint space narrowing. Greater trochanteric spurring. Advanced disc space narrowing in the lumbar spine especially  L4-5.  IMPRESSION: No acute abnormality.   Electronically Signed   By: Rolla Flatten M.D.   On: 10/15/2013 16:07     EKG Interpretation None      MDM   Final diagnoses:  Bursitis, hip, right   Pt is right hip pain for several days that's worse with weight bearing and improves at rest.  No concerning sx's for septic joint.  Pt ambulates with steady gait.  Sx's likely related to bursitis.  Pt also seen by Dr. Lacinda Axon and care plan discussed.  Pt agrees to close f/u with her PMD if not improving.  appears stable for d/c   I personally performed the services described in this documentation, which was scribed in my presence. The recorded information has been reviewed and is accurate.    Magaly Pollina L. Cruz Devilla, PA-C 10/17/13 1245

## 2013-10-19 NOTE — ED Provider Notes (Signed)
Medical screening examination/treatment/procedure(s) were conducted as a shared visit with non-physician practitioner(s) and myself.  I personally evaluated the patient during the encounter.   EKG Interpretation None     Right hip pain.  No evidence of septic joint. Plain films negative  Nat Christen, MD 10/19/13 [phone]

## 2014-02-05 DIAGNOSIS — Z7901 Long term (current) use of anticoagulants: Secondary | ICD-10-CM | POA: Insufficient documentation

## 2014-04-08 NOTE — Patient Instructions (Signed)
Your procedure is scheduled on: 04/15/2014  Report to [facility] at  800  AM.  Call this number if you have problems the morning of surgery: [phone]   Do not eat food or drink liquids :After Midnight.      Take these medicines the morning of surgery with A SIP OF WATER: atenolol, celebrex, tambocor, hydrocodone, levothyroxine, claritin, antivert, prilosec.   Do not wear jewelry, make-up or nail polish.  Do not wear lotions, powders, or perfumes.  Do not shave 48 hours prior to surgery.  Do not bring valuables to the hospital.  Contacts, dentures or bridgework may not be worn into surgery.  Leave suitcase in the car. After surgery it may be brought to your room.  For patients admitted to the hospital, checkout time is 11:00 AM the day of discharge.   Patients discharged the day of surgery will not be allowed to drive home.  :     Please read over the following fact sheets that you were given: Coughing and Deep Breathing, Surgical Site Infection Prevention, Anesthesia Post-op Instructions and Care and Recovery After Surgery    Cataract A cataract is a clouding of the lens of the eye. When a lens becomes cloudy, vision is reduced based on the degree and nature of the clouding. Many cataracts reduce vision to some degree. Some cataracts make people more near-sighted as they develop. Other cataracts increase glare. Cataracts that are ignored and become worse can sometimes look white. The white color can be seen through the pupil. CAUSES   Aging. However, cataracts may occur at any age, even in newborns.   Certain drugs.   Trauma to the eye.   Certain diseases such as diabetes.   Specific eye diseases such as chronic inflammation inside the eye or a sudden attack of a rare form of glaucoma.   Inherited or acquired medical problems.  SYMPTOMS   Gradual, progressive drop in vision in the affected eye.   Severe, rapid visual loss. This most often happens when trauma is the cause.    DIAGNOSIS  To detect a cataract, an eye doctor examines the lens. Cataracts are best diagnosed with an exam of the eyes with the pupils enlarged (dilated) by drops.  TREATMENT  For an early cataract, vision may improve by using different eyeglasses or stronger lighting. If that does not help your vision, surgery is the only effective treatment. A cataract needs to be surgically removed when vision loss interferes with your everyday activities, such as driving, reading, or watching TV. A cataract may also have to be removed if it prevents examination or treatment of another eye problem. Surgery removes the cloudy lens and usually replaces it with a substitute lens (intraocular lens, IOL).  At a time when both you and your doctor agree, the cataract will be surgically removed. If you have cataracts in both eyes, only one is usually removed at a time. This allows the operated eye to heal and be out of danger from any possible problems after surgery (such as infection or poor wound healing). In rare cases, a cataract may be doing damage to your eye. In these cases, your caregiver may advise surgical removal right away. The vast majority of people who have cataract surgery have better vision afterward. HOME CARE INSTRUCTIONS  If you are not planning surgery, you may be asked to do the following:  Use different eyeglasses.   Use stronger or brighter lighting.   Ask your eye doctor about reducing  your medicine dose or changing medicines if it is thought that a medicine caused your cataract. Changing medicines does not make the cataract go away on its own.   Become familiar with your surroundings. Poor vision can lead to injury. Avoid bumping into things on the affected side. You are at a higher risk for tripping or falling.   Exercise extreme care when driving or operating machinery.   Wear sunglasses if you are sensitive to bright light or experiencing problems with glare.  SEEK IMMEDIATE MEDICAL CARE  IF:   You have a worsening or sudden vision loss.   You notice redness, swelling, or increasing pain in the eye.   You have a fever.  Document Released: 12/21/2004 Document Revised: 12/10/2010 Document Reviewed: 08/14/2010 [facility] Patient Information 2012 [place].PATIENT INSTRUCTIONS POST-ANESTHESIA  IMMEDIATELY FOLLOWING SURGERY:  Do not drive or operate machinery for the first twenty four hours after surgery.  Do not make any important decisions for twenty four hours after surgery or while taking narcotic pain medications or sedatives.  If you develop intractable nausea and vomiting or a severe headache please notify your doctor immediately.  FOLLOW-UP:  Please make an appointment with your surgeon as instructed. You do not need to follow up with anesthesia unless specifically instructed to do so.  WOUND CARE INSTRUCTIONS (if applicable):  Keep a dry clean dressing on the anesthesia/puncture wound site if there is drainage.  Once the wound has quit draining you may leave it open to air.  Generally you should leave the bandage intact for twenty four hours unless there is drainage.  If the epidural site drains for more than 36-48 hours please call the anesthesia department.  QUESTIONS?:  Please feel free to call your physician or the hospital operator if you have any questions, and they will be happy to assist you.

## 2014-04-10 ENCOUNTER — Other Ambulatory Visit: Payer: Self-pay

## 2014-04-10 ENCOUNTER — Encounter (HOSPITAL_COMMUNITY)
Admission: RE | Admit: 2014-04-10 | Discharge: 2014-04-10 | Disposition: A | Discharge status: Home / Self Care | Payer: Medicare Other | Source: Ambulatory Visit | Attending: Ophthalmology | Admitting: Ophthalmology

## 2014-04-10 ENCOUNTER — Encounter (HOSPITAL_COMMUNITY): Payer: Self-pay

## 2014-04-10 DIAGNOSIS — Z01810 Encounter for preprocedural cardiovascular examination: Secondary | ICD-10-CM | POA: Insufficient documentation

## 2014-04-10 DIAGNOSIS — Z01812 Encounter for preprocedural laboratory examination: Secondary | ICD-10-CM | POA: Insufficient documentation

## 2014-04-10 LAB — BASIC METABOLIC PANEL
ANION GAP: 8 (ref 5–15)
BUN: 16 mg/dL (ref 6–23)
CHLORIDE: 96 mmol/L (ref 96–112)
CO2: 28 mmol/L (ref 19–32)
Calcium: 8.6 mg/dL (ref 8.4–10.5)
Creatinine, Ser: 0.66 mg/dL (ref 0.50–1.10)
GFR calc Af Amer: >90 mL/min (ref >90)
GFR calc non Af Amer: 81 mL/min — ABNORMAL LOW (ref >90)
GLUCOSE: 120 mg/dL — AB (ref 70–99)
POTASSIUM: 4.2 mmol/L (ref 3.5–5.1)
Sodium: 132 mmol/L — ABNORMAL LOW (ref 135–145)

## 2014-04-10 LAB — CBC
HCT: 38.5 % (ref 36.0–46.0)
Hemoglobin: 12.7 g/dL (ref 12.0–15.0)
MCH: 31 pg (ref 26.0–34.0)
MCHC: 33 g/dL (ref 30.0–36.0)
MCV: 93.9 fL (ref 78.0–100.0)
PLATELETS: 300 10*3/uL (ref 150–400)
RBC: 4.1 MIL/uL (ref 3.87–5.11)
RDW: 14 % (ref 11.5–15.5)
WBC: 6 10*3/uL (ref 4.0–10.5)

## 2014-04-12 MED ORDER — PHENYLEPHRINE HCL 2.5 % OP SOLN
OPHTHALMIC | Status: AC
  Filled 2014-04-12: qty 15

## 2014-04-12 MED ORDER — LIDOCAINE HCL 3.5 % OP GEL
OPHTHALMIC | Status: AC
  Filled 2014-04-12: qty 1

## 2014-04-12 MED ORDER — TETRACAINE HCL 0.5 % OP SOLN
OPHTHALMIC | Status: AC
  Filled 2014-04-12: qty 2

## 2014-04-12 MED ORDER — NEOMYCIN-POLYMYXIN-DEXAMETH 3.5-10000-0.1 OP SUSP
OPHTHALMIC | Status: AC
  Filled 2014-04-12: qty 5

## 2014-04-12 MED ORDER — LIDOCAINE HCL (PF) 1 % IJ SOLN
INTRAMUSCULAR | Status: AC
  Filled 2014-04-12: qty 2

## 2014-04-12 MED ORDER — CYCLOPENTOLATE-PHENYLEPHRINE OP SOLN OPTIME - NO CHARGE
OPHTHALMIC | Status: AC
  Filled 2014-04-12: qty 2

## 2014-04-15 ENCOUNTER — Encounter (HOSPITAL_COMMUNITY)
Admission: RE | Disposition: A | Discharge status: Home / Self Care | Payer: Self-pay | Source: Ambulatory Visit | Attending: Ophthalmology

## 2014-04-15 ENCOUNTER — Ambulatory Visit (HOSPITAL_COMMUNITY)
Admission: RE | Admit: 2014-04-15 | Discharge: 2014-04-15 | Disposition: A | Discharge status: Home / Self Care | Payer: Medicare Other | Source: Ambulatory Visit | Attending: Ophthalmology | Admitting: Ophthalmology

## 2014-04-15 ENCOUNTER — Ambulatory Visit (HOSPITAL_COMMUNITY): Payer: Medicare Other | Admitting: Anesthesiology

## 2014-04-15 ENCOUNTER — Encounter (HOSPITAL_COMMUNITY): Payer: Self-pay | Admitting: *Deleted

## 2014-04-15 DIAGNOSIS — I10 Essential (primary) hypertension: Secondary | ICD-10-CM | POA: Insufficient documentation

## 2014-04-15 DIAGNOSIS — H25811 Combined forms of age-related cataract, right eye: Secondary | ICD-10-CM | POA: Insufficient documentation

## 2014-04-15 DIAGNOSIS — Z79899 Other long term (current) drug therapy: Secondary | ICD-10-CM | POA: Insufficient documentation

## 2014-04-15 DIAGNOSIS — H269 Unspecified cataract: Secondary | ICD-10-CM | POA: Diagnosis present

## 2014-04-15 DIAGNOSIS — K219 Gastro-esophageal reflux disease without esophagitis: Secondary | ICD-10-CM | POA: Diagnosis not present

## 2014-04-15 DIAGNOSIS — E039 Hypothyroidism, unspecified: Secondary | ICD-10-CM | POA: Insufficient documentation

## 2014-04-15 HISTORY — PX: CATARACT EXTRACTION W/PHACO: SHX586

## 2014-04-15 SURGERY — PHACOEMULSIFICATION, CATARACT, WITH IOL INSERTION
Anesthesia: Monitor Anesthesia Care | Site: Eye | Laterality: Right

## 2014-04-15 MED ORDER — LACTATED RINGERS IV SOLN
INTRAVENOUS | Status: DC
  Administered 2014-04-15: 09:00:00 via INTRAVENOUS

## 2014-04-15 MED ORDER — LIDOCAINE 3.5 % OP GEL OPTIME - NO CHARGE
OPHTHALMIC | Status: DC | PRN
  Administered 2014-04-15: 1 [drp] via OPHTHALMIC

## 2014-04-15 MED ORDER — NEOMYCIN-POLYMYXIN-DEXAMETH 3.5-10000-0.1 OP SUSP
OPHTHALMIC | Status: DC | PRN
  Administered 2014-04-15: 1 [drp] via OPHTHALMIC

## 2014-04-15 MED ORDER — FENTANYL CITRATE 0.05 MG/ML IJ SOLN
INTRAMUSCULAR | Status: AC
  Filled 2014-04-15: qty 2

## 2014-04-15 MED ORDER — POVIDONE-IODINE 5 % OP SOLN
OPHTHALMIC | Status: DC | PRN
  Administered 2014-04-15: 1 via OPHTHALMIC

## 2014-04-15 MED ORDER — NA HYALUR & NA CHOND-NA HYALUR 0.55-0.5 ML IO KIT
PACK | INTRAOCULAR | Status: DC | PRN
  Administered 2014-04-15: 1 via OPHTHALMIC

## 2014-04-15 MED ORDER — MIDAZOLAM HCL 2 MG/2ML IJ SOLN
1.0000 mg | INTRAMUSCULAR | Status: DC | PRN
  Administered 2014-04-15: 2 mg via INTRAVENOUS

## 2014-04-15 MED ORDER — PHENYLEPHRINE HCL 2.5 % OP SOLN
1.0000 [drp] | OPHTHALMIC | Status: AC
  Administered 2014-04-15 (×3): 1 [drp] via OPHTHALMIC

## 2014-04-15 MED ORDER — FENTANYL CITRATE 0.05 MG/ML IJ SOLN
25.0000 ug | Freq: Once | INTRAMUSCULAR | Status: AC
  Administered 2014-04-15: 25 ug via INTRAVENOUS

## 2014-04-15 MED ORDER — LACTATED RINGERS IV SOLN
INTRAVENOUS | Status: DC | PRN
  Administered 2014-04-15: 08:00:00 via INTRAVENOUS

## 2014-04-15 MED ORDER — EPINEPHRINE HCL 1 MG/ML IJ SOLN
INTRAMUSCULAR | Status: AC
  Filled 2014-04-15: qty 1

## 2014-04-15 MED ORDER — BSS IO SOLN
INTRAOCULAR | Status: DC | PRN
  Administered 2014-04-15: 15 mL

## 2014-04-15 MED ORDER — LIDOCAINE HCL 3.5 % OP GEL
1.0000 "application " | Freq: Once | OPHTHALMIC | Status: AC
  Administered 2014-04-15: 1 via OPHTHALMIC

## 2014-04-15 MED ORDER — TETRACAINE HCL 0.5 % OP SOLN
1.0000 [drp] | OPHTHALMIC | Status: AC
  Administered 2014-04-15 (×3): 1 [drp] via OPHTHALMIC

## 2014-04-15 MED ORDER — MIDAZOLAM HCL 2 MG/2ML IJ SOLN
INTRAMUSCULAR | Status: AC
  Filled 2014-04-15: qty 2

## 2014-04-15 MED ORDER — LIDOCAINE HCL (PF) 1 % IJ SOLN
INTRAMUSCULAR | Status: DC | PRN
  Administered 2014-04-15: .4 mL

## 2014-04-15 MED ORDER — EPINEPHRINE HCL 1 MG/ML IJ SOLN
INTRAOCULAR | Status: DC | PRN
  Administered 2014-04-15: 500 mL

## 2014-04-15 MED ORDER — CYCLOPENTOLATE-PHENYLEPHRINE 0.2-1 % OP SOLN
1.0000 [drp] | OPHTHALMIC | Status: AC
  Administered 2014-04-15 (×3): 1 [drp] via OPHTHALMIC

## 2014-04-15 SURGICAL SUPPLY — 11 items
CLOTH BEACON ORANGE TIMEOUT ST (SAFETY) ×2 IMPLANT
EYE SHIELD UNIVERSAL CLEAR (GAUZE/BANDAGES/DRESSINGS) ×2 IMPLANT
GLOVE BIOGEL PI IND STRL 6.5 (GLOVE) IMPLANT
GLOVE BIOGEL PI INDICATOR 6.5 (GLOVE) ×2
GLOVE EXAM NITRILE MD LF STRL (GLOVE) ×2 IMPLANT
PAD ARMBOARD 7.5X6 YLW CONV (MISCELLANEOUS) ×2 IMPLANT
SIGHTPATH CAT PROC W REG LENS (Ophthalmic Related) ×3 IMPLANT
SYRINGE LUER LOK 1CC (MISCELLANEOUS) ×2 IMPLANT
TAPE SURG TRANSPARENT 2IN (GAUZE/BANDAGES/DRESSINGS) IMPLANT
TAPE TRANSPARENT 2IN (GAUZE/BANDAGES/DRESSINGS) ×2
WATER STERILE IRR 250ML POUR (IV SOLUTION) ×2 IMPLANT

## 2014-04-15 NOTE — Op Note (Signed)
Date of Admission: 04/15/2014  Date of Surgery: 04/15/2014   Pre-Op Dx: Cataract Right Eye  Post-Op Dx: Senile Combined Cataract Right  Eye,  Dx Code Q11.941  Surgeon: Tonny Branch, M.D.  Assistants: None  Anesthesia: Topical with MAC  Indications: Painless, progressive loss of vision with compromise of daily activities.  Surgery: Cataract Extraction with Intraocular lens Implant Right Eye  Discription: The patient had dilating drops and viscous lidocaine placed into the Right eye in the pre-op holding area. After transfer to the operating room, a time out was performed. The patient was then prepped and draped. Beginning with a 25 degree blade a paracentesis port was made at the surgeon's 2 o'clock position. The anterior chamber was then filled with 1% non-preserved lidocaine. This was followed by filling the anterior chamber with Provisc.  A 2.56mm keratome blade was used to make a clear corneal incision at the temporal limbus.  A bent cystatome needle was used to create a continuous tear capsulotomy. Hydrodissection was performed with balanced salt solution on a Fine canula. The lens nucleus was then removed using the phacoemulsification handpiece. Residual cortex was removed with the I&A handpiece. The anterior chamber and capsular bag were refilled with Provisc. A posterior chamber intraocular lens was placed into the capsular bag with it's injector. The implant was positioned with the Kuglan hook. The Provisc was then removed from the anterior chamber and capsular bag with the I&A handpiece. Stromal hydration of the main incision and paracentesis port was performed with BSS on a Fine canula. The wounds were tested for leak which was negative. The patient tolerated the procedure well. There were no operative complications. The patient was then transferred to the recovery room in stable condition.  Complications: None  Specimen: None  EBL: None  Prosthetic device: Hoya iSert 250, power 24.0  D, SN [id number].

## 2014-04-15 NOTE — Discharge Instructions (Signed)

## 2014-04-15 NOTE — H&P (Signed)
I have reviewed the H&P, the patient was re-examined, and I have identified no interval changes in medical condition and plan of care since the history and physical of record  

## 2014-04-15 NOTE — Anesthesia Procedure Notes (Signed)
Procedure Name: MAC Date/Time: 04/15/2014 9:18 AM Performed by: Andree Elk, Asiah Browder A Pre-anesthesia Checklist: Patient identified, Timeout performed, Emergency Drugs available, Suction available and Patient being monitored Oxygen Delivery Method: Nasal cannula

## 2014-04-15 NOTE — Anesthesia Preprocedure Evaluation (Signed)
Anesthesia Evaluation  Patient identified by MRN, date of birth, ID band Patient awake    Reviewed: Allergy & Precautions, H&P , NPO status , Patient's Chart, lab work & pertinent test results, reviewed documented beta blocker date and time   History of Anesthesia Complications (+) PONV and history of anesthetic complications  Airway Mallampati: II       Dental  (+) Teeth Intact   Pulmonary neg pulmonary ROS,  breath sounds clear to auscultation        Cardiovascular hypertension, Pt. on home beta blockers and Pt. on medications negative cardio ROS  + dysrhythmias Rhythm:regular Rate:Normal  A fib   Neuro/Psych negative neurological ROS  negative psych ROS   GI/Hepatic negative GI ROS, Neg liver ROS, GERD-  Medicated,  Endo/Other  negative endocrine ROSHypothyroidism   Renal/GU negative Renal ROS  negative genitourinary   Musculoskeletal negative musculoskeletal ROS (+)   Abdominal   Peds negative pediatric ROS (+)  Hematology negative hematology ROS (+)   Anesthesia Other Findings   Reproductive/Obstetrics negative OB ROS                             Anesthesia Physical Anesthesia Plan  ASA: III  Anesthesia Plan: MAC   Post-op Pain Management:    Induction: Intravenous  Airway Management Planned: Nasal Cannula  Additional Equipment:   Intra-op Plan:   Post-operative Plan:   Informed Consent: I have reviewed the patients History and Physical, chart, labs and discussed the procedure including the risks, benefits and alternatives for the proposed anesthesia with the patient or authorized representative who has indicated his/her understanding and acceptance.     Plan Discussed with:   Anesthesia Plan Comments:         Anesthesia Quick Evaluation

## 2014-04-15 NOTE — Anesthesia Postprocedure Evaluation (Signed)
  Anesthesia Post-op Note  Patient: [name]  Procedure(s) Performed: Procedure(s) with comments: CATARACT EXTRACTION PHACO AND INTRAOCULAR LENS PLACEMENT (IOC) (Right) - CDE: 13.30  Patient Location: Short Stay  Anesthesia Type:MAC  Level of Consciousness: awake, alert , oriented and patient cooperative  Airway and Oxygen Therapy: Patient Spontanous Breathing  Post-op Pain: none  Post-op Assessment: Post-op Vital signs reviewed, Patient's Cardiovascular Status Stable, Respiratory Function Stable, Patent Airway, No signs of Nausea or vomiting and Pain level controlled  Post-op Vital Signs: Reviewed and stable  Last Vitals:  Filed Vitals:   04/15/14 0915  BP: 163/64  Pulse:   Temp:   Resp: 13    Complications: No apparent anesthesia complications

## 2014-04-15 NOTE — Transfer of Care (Signed)
Immediate Anesthesia Transfer of Care Note  Patient: [name]  Procedure(s) Performed: Procedure(s) with comments: CATARACT EXTRACTION PHACO AND INTRAOCULAR LENS PLACEMENT (IOC) (Right) - CDE: 13.30  Patient Location: Short Stay  Anesthesia Type:MAC  Level of Consciousness: awake, alert , oriented and patient cooperative  Airway & Oxygen Therapy: Patient Spontanous Breathing  Post-op Assessment: Report given to RN and Post -op Vital signs reviewed and stable  Post vital signs: Reviewed and stable  Last Vitals:  Filed Vitals:   04/15/14 0915  BP: 163/64  Pulse:   Temp:   Resp: 13    Complications: No apparent anesthesia complications

## 2014-04-16 ENCOUNTER — Encounter (HOSPITAL_COMMUNITY): Payer: Self-pay | Admitting: Ophthalmology

## 2014-05-10 ENCOUNTER — Encounter (HOSPITAL_COMMUNITY)
Admission: RE | Admit: 2014-05-10 | Discharge: 2014-05-10 | Disposition: A | Discharge status: Home / Self Care | Payer: Medicare Other | Source: Ambulatory Visit | Attending: Ophthalmology | Admitting: Ophthalmology

## 2014-05-10 MED ORDER — LIDOCAINE HCL 3.5 % OP GEL
OPHTHALMIC | Status: AC
  Filled 2014-05-10: qty 1

## 2014-05-10 MED ORDER — LIDOCAINE HCL (PF) 1 % IJ SOLN
INTRAMUSCULAR | Status: AC
  Filled 2014-05-10: qty 2

## 2014-05-10 MED ORDER — NEOMYCIN-POLYMYXIN-DEXAMETH 3.5-10000-0.1 OP SUSP
OPHTHALMIC | Status: AC
Start: 2014-05-10 — End: 2014-05-10
  Filled 2014-05-10: qty 5

## 2014-05-10 MED ORDER — CYCLOPENTOLATE-PHENYLEPHRINE OP SOLN OPTIME - NO CHARGE
OPHTHALMIC | Status: AC
  Filled 2014-05-10: qty 2

## 2014-05-10 MED ORDER — PHENYLEPHRINE HCL 2.5 % OP SOLN
OPHTHALMIC | Status: AC
  Filled 2014-05-10: qty 15

## 2014-05-10 MED ORDER — TETRACAINE HCL 0.5 % OP SOLN
OPHTHALMIC | Status: AC
  Filled 2014-05-10: qty 2

## 2014-05-13 ENCOUNTER — Encounter (HOSPITAL_COMMUNITY)
Admission: RE | Disposition: A | Discharge status: Home / Self Care | Payer: Self-pay | Source: Ambulatory Visit | Attending: Ophthalmology

## 2014-05-13 ENCOUNTER — Ambulatory Visit (HOSPITAL_COMMUNITY): Payer: Medicare Other | Admitting: Anesthesiology

## 2014-05-13 ENCOUNTER — Ambulatory Visit (HOSPITAL_COMMUNITY)
Admission: RE | Admit: 2014-05-13 | Discharge: 2014-05-13 | Disposition: A | Discharge status: Home / Self Care | Payer: Medicare Other | Source: Ambulatory Visit | Attending: Ophthalmology | Admitting: Ophthalmology

## 2014-05-13 ENCOUNTER — Encounter (HOSPITAL_COMMUNITY): Payer: Self-pay | Admitting: *Deleted

## 2014-05-13 DIAGNOSIS — H25812 Combined forms of age-related cataract, left eye: Secondary | ICD-10-CM | POA: Diagnosis present

## 2014-05-13 HISTORY — PX: CATARACT EXTRACTION W/PHACO: SHX586

## 2014-05-13 SURGERY — PHACOEMULSIFICATION, CATARACT, WITH IOL INSERTION
Anesthesia: Monitor Anesthesia Care | Site: Eye | Laterality: Left

## 2014-05-13 MED ORDER — BSS IO SOLN
INTRAOCULAR | Status: DC | PRN
  Administered 2014-05-13: 15 mL

## 2014-05-13 MED ORDER — FENTANYL CITRATE (PF) 100 MCG/2ML IJ SOLN
INTRAMUSCULAR | Status: AC
  Filled 2014-05-13: qty 2

## 2014-05-13 MED ORDER — CYCLOPENTOLATE-PHENYLEPHRINE 0.2-1 % OP SOLN
1.0000 [drp] | OPHTHALMIC | Status: AC
  Administered 2014-05-13 (×3): 1 [drp] via OPHTHALMIC

## 2014-05-13 MED ORDER — EPINEPHRINE HCL 1 MG/ML IJ SOLN
INTRAMUSCULAR | Status: AC
  Filled 2014-05-13: qty 1

## 2014-05-13 MED ORDER — PHENYLEPHRINE HCL 2.5 % OP SOLN
1.0000 [drp] | OPHTHALMIC | Status: AC
  Administered 2014-05-13 (×3): 1 [drp] via OPHTHALMIC

## 2014-05-13 MED ORDER — FENTANYL CITRATE (PF) 100 MCG/2ML IJ SOLN
25.0000 ug | INTRAMUSCULAR | Status: AC
  Administered 2014-05-13: 25 ug via INTRAVENOUS

## 2014-05-13 MED ORDER — TETRACAINE HCL 0.5 % OP SOLN
1.0000 [drp] | OPHTHALMIC | Status: AC
  Administered 2014-05-13 (×3): 1 [drp] via OPHTHALMIC

## 2014-05-13 MED ORDER — NEOMYCIN-POLYMYXIN-DEXAMETH 3.5-10000-0.1 OP SUSP
OPHTHALMIC | Status: DC | PRN
Start: 2014-05-13 — End: 2014-05-13
  Administered 2014-05-13: 1 [drp] via OPHTHALMIC

## 2014-05-13 MED ORDER — EPINEPHRINE HCL 1 MG/ML IJ SOLN
INTRAOCULAR | Status: DC | PRN
  Administered 2014-05-13: 500 mL

## 2014-05-13 MED ORDER — LIDOCAINE HCL (PF) 1 % IJ SOLN
INTRAMUSCULAR | Status: DC | PRN
  Administered 2014-05-13: .6 mL

## 2014-05-13 MED ORDER — MIDAZOLAM HCL 2 MG/2ML IJ SOLN
INTRAMUSCULAR | Status: AC
  Filled 2014-05-13: qty 2

## 2014-05-13 MED ORDER — MIDAZOLAM HCL 2 MG/2ML IJ SOLN
1.0000 mg | INTRAMUSCULAR | Status: DC | PRN
  Administered 2014-05-13: 2 mg via INTRAVENOUS

## 2014-05-13 MED ORDER — POVIDONE-IODINE 5 % OP SOLN
OPHTHALMIC | Status: DC | PRN
  Administered 2014-05-13: 1 via OPHTHALMIC

## 2014-05-13 MED ORDER — PROVISC 10 MG/ML IO SOLN
INTRAOCULAR | Status: DC | PRN
  Administered 2014-05-13: 0.85 mL via INTRAOCULAR

## 2014-05-13 MED ORDER — LIDOCAINE 3.5 % OP GEL OPTIME - NO CHARGE
OPHTHALMIC | Status: DC | PRN
  Administered 2014-05-13: 1 [drp] via OPHTHALMIC

## 2014-05-13 MED ORDER — LIDOCAINE HCL 3.5 % OP GEL: 1.0000 "application " | Freq: Once | OPHTHALMIC | Status: DC

## 2014-05-13 MED ORDER — LACTATED RINGERS IV SOLN
INTRAVENOUS | Status: DC
  Administered 2014-05-13: 13:00:00 via INTRAVENOUS

## 2014-05-13 SURGICAL SUPPLY — 10 items
CLOTH BEACON ORANGE TIMEOUT ST (SAFETY) ×2 IMPLANT
EYE SHIELD UNIVERSAL CLEAR (GAUZE/BANDAGES/DRESSINGS) ×2 IMPLANT
GLOVE BIOGEL PI IND STRL 7.0 (GLOVE) IMPLANT
GLOVE BIOGEL PI INDICATOR 7.0 (GLOVE) ×4
PAD ARMBOARD 7.5X6 YLW CONV (MISCELLANEOUS) ×2 IMPLANT
SIGHTPATH CAT PROC W REG LENS (Ophthalmic Related) ×3 IMPLANT
SYRINGE LUER LOK 1CC (MISCELLANEOUS) ×2 IMPLANT
TAPE SURG TRANSPORE 1 IN (GAUZE/BANDAGES/DRESSINGS) IMPLANT
TAPE SURGICAL TRANSPORE 1 IN (GAUZE/BANDAGES/DRESSINGS) ×2
WATER STERILE IRR 250ML POUR (IV SOLUTION) ×2 IMPLANT

## 2014-05-13 NOTE — H&P (Signed)
I have reviewed the H&P, the patient was re-examined, and I have identified no interval changes in medical condition and plan of care since the history and physical of record  

## 2014-05-13 NOTE — Anesthesia Preprocedure Evaluation (Signed)
Anesthesia Evaluation  Patient identified by MRN, date of birth, ID band Patient awake    Reviewed: Allergy & Precautions, H&P , NPO status , Patient's Chart, lab work & pertinent test results, reviewed documented beta blocker date and time   History of Anesthesia Complications (+) PONV and history of anesthetic complications  Airway Mallampati: II       Dental  (+) Teeth Intact   Pulmonary neg pulmonary ROS,  breath sounds clear to auscultation        Cardiovascular hypertension, Pt. on home beta blockers and Pt. on medications negative cardio ROS  + dysrhythmias Rhythm:regular Rate:Normal  A fib   Neuro/Psych negative neurological ROS  negative psych ROS   GI/Hepatic negative GI ROS, Neg liver ROS, GERD-  Medicated,  Endo/Other  negative endocrine ROSHypothyroidism   Renal/GU negative Renal ROS  negative genitourinary   Musculoskeletal negative musculoskeletal ROS (+)   Abdominal   Peds negative pediatric ROS (+)  Hematology negative hematology ROS (+)   Anesthesia Other Findings   Reproductive/Obstetrics negative OB ROS                             Anesthesia Physical Anesthesia Plan  ASA: III  Anesthesia Plan: MAC   Post-op Pain Management:    Induction: Intravenous  Airway Management Planned: Nasal Cannula  Additional Equipment:   Intra-op Plan:   Post-operative Plan:   Informed Consent: I have reviewed the patients History and Physical, chart, labs and discussed the procedure including the risks, benefits and alternatives for the proposed anesthesia with the patient or authorized representative who has indicated his/her understanding and acceptance.     Plan Discussed with:   Anesthesia Plan Comments:         Anesthesia Quick Evaluation

## 2014-05-13 NOTE — Discharge Instructions (Signed)

## 2014-05-13 NOTE — Anesthesia Procedure Notes (Signed)
Procedure Name: MAC Date/Time: 05/13/2014 12:25 PM Performed by: Vista Deck Pre-anesthesia Checklist: Patient identified, Emergency Drugs available, Suction available, Timeout performed and Patient being monitored Patient Re-evaluated:Patient Re-evaluated prior to inductionOxygen Delivery Method: Nasal Cannula

## 2014-05-13 NOTE — Op Note (Signed)
Date of Admission: 05/13/2014  Date of Surgery: 05/13/2014   Pre-Op Dx: Cataract Left Eye  Post-Op Dx: Senile Combined Cataract Left  Eye,  Dx Code K91.791  Surgeon: Tonny Branch, M.D.  Assistants: None  Anesthesia: Topical with MAC  Indications: Painless, progressive loss of vision with compromise of daily activities.  Surgery: Cataract Extraction with Intraocular lens Implant Left Eye  Discription: The patient had dilating drops and viscous lidocaine placed into the Left eye in the pre-op holding area. After transfer to the operating room, a time out was performed. The patient was then prepped and draped. Beginning with a 42 degree blade a paracentesis port was made at the surgeon's 2 o'clock position. The anterior chamber was then filled with 1% non-preserved lidocaine. This was followed by filling the anterior chamber with Provisc.  A 2.52mm keratome blade was used to make a clear corneal incision at the temporal limbus.  A bent cystatome needle was used to create a continuous tear capsulotomy. Hydrodissection was performed with balanced salt solution on a Fine canula. The lens nucleus was then removed using the phacoemulsification handpiece. Residual cortex was removed with the I&A handpiece. The anterior chamber and capsular bag were refilled with Provisc. A posterior chamber intraocular lens was placed into the capsular bag with it's injector. The implant was positioned with the Kuglan hook. The Provisc was then removed from the anterior chamber and capsular bag with the I&A handpiece. Stromal hydration of the main incision and paracentesis port was performed with BSS on a Fine canula. The wounds were tested for leak which was negative. The patient tolerated the procedure well. There were no operative complications. The patient was then transferred to the recovery room in stable condition.  Complications: None  Specimen: None  EBL: None  Prosthetic device: Hoya iSert 250, power 21.0 D, SN  [id number].

## 2014-05-13 NOTE — Anesthesia Postprocedure Evaluation (Signed)
  Anesthesia Post-op Note  Patient: [name]  Procedure(s) Performed: Procedure(s) (LRB): CATARACT EXTRACTION PHACO AND INTRAOCULAR LENS PLACEMENT LEFT EYE (Left)  Patient Location:  Short Stay  Anesthesia Type: MAC  Level of Consciousness: awake  Airway and Oxygen Therapy: Patient Spontanous Breathing  Post-op Pain: none  Post-op Assessment: Post-op Vital signs reviewed, Patient's Cardiovascular Status Stable, Respiratory Function Stable, Patent Airway, No signs of Nausea or vomiting and Pain level controlled  Post-op Vital Signs: Reviewed and stable  Complications: No apparent anesthesia complications

## 2014-05-13 NOTE — Transfer of Care (Signed)
Immediate Anesthesia Transfer of Care Note  Patient: [name]  Procedure(s) Performed: Procedure(s) with comments: CATARACT EXTRACTION PHACO AND INTRAOCULAR LENS PLACEMENT LEFT EYE (Left) - CDE:13.00  Patient Location: Short Stay  Anesthesia Type:MAC  Level of Consciousness: awake and alert   Airway & Oxygen Therapy: Patient Spontanous Breathing  Post-op Assessment: Report given to RN, Post -op Vital signs reviewed and stable and Patient moving all extremities  Post vital signs: Reviewed and stable  Last Vitals:  Filed Vitals:   05/13/14 1325  BP: 182/71  Temp:   Resp: 24    Complications: No apparent anesthesia complications

## 2014-05-14 ENCOUNTER — Encounter (HOSPITAL_COMMUNITY): Payer: Self-pay | Admitting: Ophthalmology

## 2014-05-29 ENCOUNTER — Ambulatory Visit (INDEPENDENT_AMBULATORY_CARE_PROVIDER_SITE_OTHER): Payer: Medicare Other | Admitting: Cardiology

## 2014-05-29 ENCOUNTER — Encounter: Payer: Self-pay | Admitting: Cardiology

## 2014-05-29 VITALS — BP 140/80 | HR 56 | Ht 62.0 in | Wt 146.0 lb

## 2014-05-29 DIAGNOSIS — I480 Paroxysmal atrial fibrillation: Secondary | ICD-10-CM | POA: Diagnosis not present

## 2014-05-29 NOTE — Patient Instructions (Signed)
Medication Instructions:  Please stop your ASA.  Continue all other medications as listed.  Follow-Up: Follow up in 1 year with Dr. Percival Spanish in [place].  You will receive a letter in the mail 2 months before you are due.  Please call us when you receive this letter to schedule your follow up appointment.  Thank you for choosing [facility]!!

## 2014-05-29 NOTE — Progress Notes (Signed)
HPI The patient has a history of atrial fibrillation and premature contractions.   She has had these paroxysmally. She says they occur maybe every couple of weeks and see clusters. They used to be worse but she still bothered by them though not enough medical attention. She's not had any presyncope or syncope. ShShe complains mostly of burning pain in her feet and toes.  e says that she is doing well. She denies any chest pressure, neck or arm discomfort. She's not had any presyncope or syncope. She remains active.  she thinks she might have had a couple of mini strokes since December of last year. However, an MRI was negative for any acute findings. I have reviewed these records. She was told to continue aspirin which we had asked her to discontinue.   Allergies  Allergen Reactions  . Penicillins Rash    Current Outpatient Prescriptions  Medication Sig Dispense Refill  . acetaminophen (TYLENOL) 500 MG tablet Take 500 mg by mouth every 6 (six) hours as needed for moderate pain.    Marland Kitchen aspirin 81 MG tablet Take 81 mg by mouth daily.     Marland Kitchen atenolol (TENORMIN) 25 MG tablet Take 25 mg by mouth daily.     . celecoxib (CELEBREX) 200 MG capsule Take 200 mg by mouth every morning.     . dicyclomine (BENTYL) 10 MG capsule Take 10 mg by mouth daily as needed (upset stomach).    . ergocalciferol (VITAMIN D2) 50000 UNITS capsule Take 50,000 Units by mouth once a week. Pt takes on sunday    . flecainide (TAMBOCOR) 100 MG tablet Take 100 mg by mouth 2 (two) times daily.     . furosemide (LASIX) 20 MG tablet Take 20 mg by mouth every morning.     Marland Kitchen GARLIC PO Take 1 tablet by mouth daily.    Marland Kitchen levothyroxine (SYNTHROID, LEVOTHROID) 50 MCG tablet Take 50 mcg by mouth every morning.     . meclizine (ANTIVERT) 25 MG tablet Take 25 mg by mouth daily as needed. Inner ear    . omeprazole (PRILOSEC) 40 MG capsule Take 40 mg by mouth daily as needed (acid reflux).     . potassium chloride SA (K-DUR,KLOR-CON) 20 MEQ  tablet Take 20 mEq by mouth daily.     . rosuvastatin (CRESTOR) 5 MG tablet Take 5 mg by mouth at bedtime.    . Soft Lens Products (LUBRICATING & REWETTING) SOLN Place 2 drops into both eyes 2 (two) times daily.     . vitamin B-12 (CYANOCOBALAMIN) 500 MCG tablet Take 500 mcg by mouth daily.     Marland Kitchen warfarin (COUMADIN) 5 MG tablet Take 5-7.5 mg by mouth every morning. 7.5mg  on Wednesday and Saturday and 5 mg all other days.    Marland Kitchen zolpidem (AMBIEN) 10 MG tablet Take 5 mg by mouth at bedtime.     Marland Kitchen HYDROcodone-acetaminophen (NORCO/VICODIN) 5-325 MG per tablet Take one tab po q 4-6 hrs prn pain (Patient taking differently: Take 1 tablet by mouth every 4 (four) hours as needed for moderate pain. Take one tab po q 4-6 hrs prn pain) 15 tablet 0   No current facility-administered medications for this visit.    Past Medical History  Diagnosis Date  . Atrial fibrillation     Paroxysmal, Flecainide therapy  . Warfarin anticoagulation   . Drug therapy     Flecainide for atrial fibrillation  . Hypothyroidism   . Carotid bruit     Doppler, December,  2009, no abnormality  . Ejection fraction     EF normal, echo, 2003  //   EF 60-65%, echo, June 21, 1285, diastolic dysfunction,  . Normal nuclear stress test     Normal, 2001  . Diverticulosis   . HLD (hyperlipidemia)   . HTN (hypertension)   . Osteoarthritis   . Preop cardiovascular exam   . Calf pain     September, 2012, at rest  . PONV (postoperative nausea and vomiting)   . GERD (gastroesophageal reflux disease)   . Anemia   . Internal hemorrhoid   . Hypothyroidism     Past Surgical History  Procedure Laterality Date  . Total knee arthroplasty  05/26/10    right  . Total shoulder replacement  01/2010    left  . Shoulder open rotator cuff repair      bilateral  . Total knee arthroplasty  11/17/2010    Procedure: TOTAL KNEE ARTHROPLASTY;  Surgeon: Mauri Pole;  Location: [facility];  Service: Orthopedics;  Laterality: Left;  . Total knee  arthroplasty      Right  . Cataract extraction w/phaco Right 04/15/2014    Procedure: CATARACT EXTRACTION PHACO AND INTRAOCULAR LENS PLACEMENT (IOC);  Surgeon: Tonny Branch, MD;  Location: [facility];  Service: Ophthalmology;  Laterality: Right;  CDE: 13.30  . Cataract extraction w/phaco Left 05/13/2014    Procedure: CATARACT EXTRACTION PHACO AND INTRAOCULAR LENS PLACEMENT LEFT EYE;  Surgeon: Tonny Branch, MD;  Location: [facility];  Service: Ophthalmology;  Laterality: Left;  CDE:13.00    ROS:  Burning pain in her toes.  Otherwise as stated in the HPI and negative for all other systems.  PHYSICAL EXAM BP 140/80 mmHg  Pulse 56  Ht 5\' 2"  (1.575 m)  Wt 146 lb (66.225 kg)  BMI 26.70 kg/m2 GENERAL:  Well appearing NECK:  No jugular venous distention, waveform within normal limits, carotid upstroke brisk and symmetric, no bruits, no thyromegaly LUNGS:  Clear to auscultation bilaterally BACK:  No CVA tenderness CHEST:  Unremarkable HEART:  PMI not displaced or sustained,S1 and S2 within normal limits, no S3, no S4, no clicks, no rubs, no murmurs ABD:  Flat, positive bowel sounds normal in frequency in pitch, no bruits, no rebound, no guarding, no midline pulsatile mass, no hepatomegaly, no splenomegaly EXT:  2 plus pulses throughout, no edema, no cyanosis no clubbing    ASSESSMENT AND PLAN  PVCs:   These are about 3% by a Holter previously. She's not particularly symptomatic with them at this point. No change in therapy is indicated.  ATRIAL FIB:   I did again tell her of the excess risk of taking an aspirin she will stop this as previously recommended.  without proven stroke or TIA and because she takes Celebrex as well she is at high risk of bleeding if she remains on aspirin.  HTN:  The blood pressure is at target. No change in medications is indicated. We will continue with therapeutic lifestyle changes (TLC).

## 2014-06-17 DIAGNOSIS — M79672 Pain in left foot: Secondary | ICD-10-CM | POA: Insufficient documentation

## 2014-09-10 DIAGNOSIS — E785 Hyperlipidemia, unspecified: Secondary | ICD-10-CM | POA: Insufficient documentation

## 2014-09-12 ENCOUNTER — Ambulatory Visit: Payer: Self-pay | Admitting: Family Medicine

## 2014-10-14 ENCOUNTER — Encounter: Payer: Self-pay | Admitting: Family Medicine

## 2014-10-14 ENCOUNTER — Ambulatory Visit (INDEPENDENT_AMBULATORY_CARE_PROVIDER_SITE_OTHER): Payer: Medicare Other | Admitting: Family Medicine

## 2014-10-14 VITALS — BP 147/75 | HR 57 | Temp 97.3°F | Ht 62.0 in | Wt 148.6 lb

## 2014-10-14 DIAGNOSIS — I480 Paroxysmal atrial fibrillation: Secondary | ICD-10-CM | POA: Diagnosis not present

## 2014-10-14 LAB — POCT INR: INR: 3.2

## 2014-10-14 MED ORDER — ATORVASTATIN CALCIUM 10 MG PO TABS: 10.0000 mg | ORAL_TABLET | Freq: Every day | ORAL | Status: DC

## 2014-10-14 NOTE — Progress Notes (Addendum)
   HPI  Patient presents today here to establish care.  She's previously seen Dr. Leonarda Salon.  She see's Dr. Percival Spanish for cardiology  She feels well today except for some dizziness, she states this is consistent with her usual vertigo and that she has the room spinning sensation which is helped by meclizine. She denies any other complaints today.  She is using aspirin intermittently, we discussed the risks of this considering her Coumadin and Celebrex. She would like to continue Celebrex for now. She will stop aspirin.  She is due for her INR checked today. She's had her flu shot already this year, she's also had both pneumonia shots at her PCPs office . she requests records  She is not taking Crestor due to myalgias, she agrees to change to Lipitor  PMH: Smoking status noted Her past medical, social, surgical, and family history were reviewed and updated in the relevant portions of EMR ROS: Per HPI  Objective: BP 147/75 mmHg  Pulse 57  Temp(Src) 97.3 F (36.3 C) (Oral)  Ht 5\' 2"  (1.575 m)  Wt 148 lb 9.6 oz (67.405 kg)  BMI 27.17 kg/m2 Gen: NAD, alert, cooperative with exam HEENT: NCAT, , nares clear, oropharynx clear, TMs normal bilaterally Neck: No tender lymphadenopathy, no thyromegaly CV: RRR, good S1/S2, no murmur Resp: CTABL, no wheezes, non-labored Abd: Soft, slight tenderness to palpation of left lower quadrant and less so throughout, Ext: Trace to 1+ pitting edema Neuro: Alert and oriented  Assessment and plan:  # Healthcare maintenance Up-to-date on flu shot and pneumonia shots He requests records, she's had recent labs  # Hypertension Slightly elevated, usually in the normal range at her other follow-ups Continue current medications including atenolol and Lasix  # Insomnia She is in Ambien, 5 mg per night, sometimes uses an extra 5 mg  # Pain Chronic osteoarthritis pain, controlled with Celebrex She would like hydrocodone, we had a discussion about  limiting narcotic use and I declined a refill.  Could consider tramadol For now will continue Celebrex She is also on gabapentin, she states 300 mg that her sleepy, we will trial 300 mg tablet one time at night instead of Ambien to see if this will help her sleep as well as improve her pain. 100 mg tablet of gabapentin twice daily currently rating her pain.   Meds ordered this encounter  Medications  . DISCONTD: flecainide (TAMBOCOR) 100 MG tablet    Sig: Take 2 tablets by mouth  daily  . gabapentin (NEURONTIN) 100 MG capsule    Sig: Take 1 tab in the morning    Laroy Apple, MD [facility] 10/14/2014, 3:28 PM

## 2014-10-14 NOTE — Patient Instructions (Addendum)
Great to meet you!   Try the 300 mg gabapentin at night without the ambien, if this helps you sleep and relieves pain it could be very helpful to stop ambien,   You do not need to be on a daily aspirin, since you are on coumadin and celebrex  Try epley's maneuvers for your vertigo.     Anticoagulation Dose Instructions as of 10/14/2014      [name] Tue Wed Thu Fri Sat   New Dose 5 mg 5 mg 5 mg 5 mg 5 mg 5 mg 5 mg    Description        No warfarin tomorrow - Tuesday, October 11th.  Then restart usually warfarin dose of 5mg  - take 1 tablet daily.     INR was 3.2 today (just a little thin - goal is 2.0 to 3.0)

## 2014-10-14 NOTE — Addendum Note (Signed)
Addended by: Timmothy Euler on: 10/14/2014 04:28 PM   Modules accepted: Orders

## 2014-11-11 ENCOUNTER — Ambulatory Visit (INDEPENDENT_AMBULATORY_CARE_PROVIDER_SITE_OTHER): Payer: Medicare Other | Admitting: Pharmacist

## 2014-11-11 ENCOUNTER — Other Ambulatory Visit: Payer: Self-pay | Admitting: Pharmacist

## 2014-11-11 DIAGNOSIS — I480 Paroxysmal atrial fibrillation: Secondary | ICD-10-CM | POA: Diagnosis not present

## 2014-11-11 LAB — POCT INR: INR: 2.8

## 2014-11-11 NOTE — Patient Instructions (Addendum)
Week 1: Change zolpidem / Ambien to 10mg  - take 1/2 tablet at bedtime Also take gabapentin 100mg  1 capsule at bedtime  Week 2:  Change zolpidem / Ambien to 5mg  - take 1/2 tablet at bedtime Increase gabapentin 100mg  to 2 capsules at bedtime   Week 3:  Try to not take zolpidem  Increase gabapenin to 100mg  3 capsules at bedtime or 1 capsule of 300mg  strength.  Anticoagulation Dose Instructions as of 11/11/2014      [name] Tue Wed Thu Fri Sat   New Dose 5 mg 5 mg 5 mg 5 mg 5 mg 5 mg 5 mg    Description        Continue current warfarin dose of 5mg  - take 1 tablet daily.

## 2014-11-12 ENCOUNTER — Ambulatory Visit (INDEPENDENT_AMBULATORY_CARE_PROVIDER_SITE_OTHER): Payer: Medicare Other

## 2014-11-12 ENCOUNTER — Ambulatory Visit (INDEPENDENT_AMBULATORY_CARE_PROVIDER_SITE_OTHER): Payer: Medicare Other | Admitting: Family Medicine

## 2014-11-12 ENCOUNTER — Encounter: Payer: Self-pay | Admitting: Family Medicine

## 2014-11-12 VITALS — BP 122/60 | HR 64 | Temp 97.0°F | Ht 62.0 in | Wt 148.6 lb

## 2014-11-12 DIAGNOSIS — L97521 Non-pressure chronic ulcer of other part of left foot limited to breakdown of skin: Secondary | ICD-10-CM

## 2014-11-12 DIAGNOSIS — I519 Heart disease, unspecified: Secondary | ICD-10-CM | POA: Diagnosis not present

## 2014-11-12 DIAGNOSIS — I5189 Other ill-defined heart diseases: Secondary | ICD-10-CM

## 2014-11-12 DIAGNOSIS — I5032 Chronic diastolic (congestive) heart failure: Secondary | ICD-10-CM | POA: Insufficient documentation

## 2014-11-12 MED ORDER — TRAMADOL HCL 50 MG PO TABS: 50.0000 mg | ORAL_TABLET | Freq: Three times a day (TID) | ORAL | Status: DC | PRN

## 2014-11-12 NOTE — Patient Instructions (Signed)
We will call with your toe x ray resullts  We will avoid antibiotics for now given the increased risk and your coumadin.   Lets see you back in 1 month

## 2014-11-12 NOTE — Telephone Encounter (Signed)
Pt requesting narcotics I declined filling las month at our visit.  Will decline again, she may come in for dicussion if she would like to.   Laroy Apple, MD [facility] 11/12/2014, 7:54 AM

## 2014-11-12 NOTE — Telephone Encounter (Signed)
Patient aware and appointment scheduled.  

## 2014-11-12 NOTE — Progress Notes (Signed)
   HPI  Patient presents today here for refill of her pain medications and evaluation of toe ulcer.  Pain medication She states that she's been on hydrocodone for years intermittently. She was previously prescribed for left shoulder pain, right shoulder pain, and bilateral knee pain She continues to have bilateral knee pain and hip pain. She states that she takes about one half of the pill intermittently. She is willing to try tramadol. She has been taking gabapentin which is helping some for her neuropathy.  Toe ulcer She states that it's been present for about 2 years. She has intermittent pain and swelling of that area. Her swelling is improved today. She denies fever, chills, sweats  PMH: Smoking status noted ROS: Per HPI  Objective: BP 122/60 mmHg  Pulse 64  Temp(Src) 97 F (36.1 C) (Oral)  Ht 5\' 2"  (1.575 m)  Wt 148 lb 9.6 oz (67.405 kg)  BMI 27.17 kg/m2 Gen: NAD, alert, cooperative with exam HEENT: NCAT CV: RRR, good S1/S2, no murmur Resp: CTABL, no wheezes, non-labored Ext: Left second toe with ulcer over her PIP, no surrounding erythema or warmth, no drainage Neuro: Alert and oriented, No gross deficits  Assessment and plan:  # Chronic pain Try tramadol Still declining narcotics  # Toe ulcer Check x-ray today for signs of osteomyelitis Consider antibiotic course Also consider referral to orthopedic surgery versus dermatology depending on the results, if she has a headache she requests Dr[facility]Oland at [facility] ortho   Orders Placed This Encounter  Procedures  . DG Foot Complete Left    Standing Status: Future     Number of Occurrences:      Standing Expiration Date: 01/12/2016    Order Specific Question:  Reason for Exam (SYMPTOM  OR DIAGNOSIS REQUIRED)    Answer:  L second toe ulcer, eval for osteo    Order Specific Question:  Preferred imaging location?    Answer:  Internal    Meds ordered this encounter  Medications  . fluticasone (FLONASE) 50 MCG/ACT  nasal spray    Sig: Use 2 sprays in each  nostril once daily  . DISCONTD: meclizine (ANTIVERT) 25 MG tablet    Sig: Take 1 tablet by mouth 3  times daily  . traMADol (ULTRAM) 50 MG tablet    Sig: Take 1 tablet (50 mg total) by mouth every 8 (eight) hours as needed.    Dispense:  30 tablet    Refill:  [place], MD [facility] 11/12/2014, 2:48 PM

## 2014-11-13 ENCOUNTER — Ambulatory Visit: Payer: Medicare Other | Admitting: Family Medicine

## 2014-11-13 ENCOUNTER — Other Ambulatory Visit: Payer: Self-pay | Admitting: Family Medicine

## 2014-11-13 DIAGNOSIS — L97509 Non-pressure chronic ulcer of other part of unspecified foot with unspecified severity: Secondary | ICD-10-CM | POA: Insufficient documentation

## 2014-11-13 DIAGNOSIS — L97511 Non-pressure chronic ulcer of other part of right foot limited to breakdown of skin: Secondary | ICD-10-CM

## 2014-11-13 MED ORDER — CLINDAMYCIN HCL 300 MG PO CAPS: 300.0000 mg | ORAL_CAPSULE | Freq: Three times a day (TID) | ORAL | Status: DC

## 2014-11-15 ENCOUNTER — Telehealth: Payer: Self-pay | Admitting: Pharmacist

## 2014-11-15 ENCOUNTER — Ambulatory Visit: Payer: Self-pay | Admitting: Pharmacist

## 2014-11-15 NOTE — Telephone Encounter (Signed)
Since just started yesterday OK to wait to recheck INR either Monday 11/13 or Tuesday 11/14.

## 2014-11-16 DIAGNOSIS — M25512 Pain in left shoulder: Secondary | ICD-10-CM | POA: Diagnosis not present

## 2014-11-19 ENCOUNTER — Ambulatory Visit (INDEPENDENT_AMBULATORY_CARE_PROVIDER_SITE_OTHER): Payer: Medicare Other | Admitting: Pharmacist Clinician (PhC)/ Clinical Pharmacy Specialist

## 2014-11-19 DIAGNOSIS — I480 Paroxysmal atrial fibrillation: Secondary | ICD-10-CM

## 2014-11-19 LAB — POCT INR: INR: 3.2

## 2014-11-19 NOTE — Patient Instructions (Signed)
Anticoagulation Dose Instructions as of 11/19/2014      [name] Tue Wed Thu Fri Sat   New Dose 5 mg 5 mg 5 mg 5 mg 5 mg 5 mg 5 mg    Description        No warfarin tomorrow - November 20, 2014. Then continue current warfarin dose of 5mg  - take 1 tablet daily.

## 2014-11-20 ENCOUNTER — Other Ambulatory Visit: Payer: Self-pay | Admitting: Pharmacist

## 2014-11-20 MED ORDER — ZOLPIDEM TARTRATE 5 MG PO TABS: ORAL_TABLET | ORAL | Status: DC

## 2014-11-25 ENCOUNTER — Ambulatory Visit (INDEPENDENT_AMBULATORY_CARE_PROVIDER_SITE_OTHER): Payer: Medicare Other | Admitting: Family Medicine

## 2014-11-25 ENCOUNTER — Encounter: Payer: Self-pay | Admitting: Family Medicine

## 2014-11-25 VITALS — BP 137/78 | HR 61 | Temp 97.0°F | Ht 62.0 in | Wt 147.8 lb

## 2014-11-25 DIAGNOSIS — G4700 Insomnia, unspecified: Secondary | ICD-10-CM

## 2014-11-25 DIAGNOSIS — G894 Chronic pain syndrome: Secondary | ICD-10-CM

## 2014-11-25 DIAGNOSIS — L97511 Non-pressure chronic ulcer of other part of right foot limited to breakdown of skin: Secondary | ICD-10-CM

## 2014-11-25 MED ORDER — ZOLPIDEM TARTRATE 10 MG PO TABS: 5.0000 mg | ORAL_TABLET | Freq: Every evening | ORAL | Status: DC | PRN

## 2014-11-25 MED ORDER — FUROSEMIDE 40 MG PO TABS: 20.0000 mg | ORAL_TABLET | ORAL | Status: DC

## 2014-11-25 MED ORDER — MUPIROCIN 2 % EX OINT: 1.0000 "application " | TOPICAL_OINTMENT | Freq: Two times a day (BID) | CUTANEOUS | Status: DC

## 2014-11-25 NOTE — Patient Instructions (Addendum)
Great to see you!  Try the ointment on your toe, if you are not completely resolved in 2 weeks please let me know and we will send you to the wound care center.   Come back in 2 months

## 2014-11-25 NOTE — Progress Notes (Signed)
   HPI  Patient presents today here for follow-up she'll ulcer and chronic pain.  Toe ulcer Improved after clindamycin but still not resolved. It seems to run better and then worse again. She states that it's been there for over 2 years  Chronic pain Left shoulder, knee pain Helped by tramadol, tolerating it well Using approximately 1 pill daily.  A. fib No racing heart, taking Coumadin as prescribed  PMH: Smoking status noted ROS: Per HPI  Objective: BP 137/78 mmHg  Pulse 61  Temp(Src) 97 F (36.1 C) (Oral)  Ht 5\' 2"  (1.575 m)  Wt 147 lb 12.8 oz (67.042 kg)  BMI 27.03 kg/m2 Gen: NAD, alert, cooperative with exam HEENT: NCAT CV: RRR, good S1/S2, no murmur Resp: CTABL, no wheezes, non-labored Ext: No edema, warm Neuro: Alert and oriented, No gross deficits Skin: 5 mm x 7 mm circular lesion on the left second medial toe, shallow ulceration  Assessment and plan:  # Chronic left toe ulcer Improved after clindamycin Prescribed mupirocin, twice a day 2 weeks if not improving will send to wound care center   # Chronic pain Doing well tramadol   # Insomnia Would like her Ambien filled differently, 10 mg tablets so she can take one half on most days.  Labs to evaluate decreased diuresis with her usual dose of loop diuretic, previous renal function very good for age. He is losing using diuretic for edema which she has been doing long-term.    Meds ordered this encounter  Medications  . zolpidem (AMBIEN) 10 MG tablet    Sig: Take 0.5-1 tablets (5-10 mg total) by mouth at bedtime as needed for sleep. Take 1/2 to 1 tablet at bedtime as needed    Dispense:  15 tablet    Refill:  2  . furosemide (LASIX) 40 MG tablet    Sig: Take 0.5-1 tablets (20-40 mg total) by mouth every morning.    Dispense:  30 tablet    Refill:  [place], MD [facility] 11/25/2014, 9:27 AM

## 2014-11-26 LAB — CMP14+EGFR
A/G RATIO: 1.6 (ref 1.1–2.5)
ALT: 9 IU/L (ref 0–32)
AST: 19 IU/L (ref 0–40)
Albumin: 4 g/dL (ref 3.5–4.7)
Alkaline Phosphatase: 48 IU/L (ref 39–117)
BILIRUBIN TOTAL: 0.3 mg/dL (ref 0.0–1.2)
BUN/Creatinine Ratio: 11 (ref 11–26)
BUN: 8 mg/dL (ref 8–27)
CALCIUM: 8.9 mg/dL (ref 8.7–10.3)
CHLORIDE: 92 mmol/L — AB (ref 97–106)
CO2: 29 mmol/L (ref 18–29)
Creatinine, Ser: 0.75 mg/dL (ref 0.57–1.00)
GFR calc Af Amer: 87 mL/min/{1.73_m2} (ref >59)
GFR calc non Af Amer: 76 mL/min/{1.73_m2} (ref >59)
GLOBULIN, TOTAL: 2.5 g/dL (ref 1.5–4.5)
Glucose: 84 mg/dL (ref 65–99)
POTASSIUM: 4.2 mmol/L (ref 3.5–5.2)
Sodium: 135 mmol/L — ABNORMAL LOW (ref 136–144)
TOTAL PROTEIN: 6.5 g/dL (ref 6.0–8.5)

## 2014-12-04 ENCOUNTER — Telehealth: Payer: Self-pay | Admitting: Family Medicine

## 2014-12-06 ENCOUNTER — Ambulatory Visit: Payer: Medicare Other | Admitting: Family Medicine

## 2014-12-09 ENCOUNTER — Encounter: Payer: Self-pay | Admitting: Family Medicine

## 2014-12-09 ENCOUNTER — Ambulatory Visit (INDEPENDENT_AMBULATORY_CARE_PROVIDER_SITE_OTHER): Payer: Medicare Other | Admitting: Family Medicine

## 2014-12-09 VITALS — BP 149/72 | HR 58 | Temp 97.0°F | Ht 62.0 in | Wt 148.4 lb

## 2014-12-09 DIAGNOSIS — L97511 Non-pressure chronic ulcer of other part of right foot limited to breakdown of skin: Secondary | ICD-10-CM | POA: Diagnosis not present

## 2014-12-09 NOTE — Patient Instructions (Addendum)
Great to see you!  Be sure to cancel the wound care appointment after you get an appointment with Dr. Irving Shows.   Please come back if you have any concerns before then.   Lets plan to see you back here in 2 months or so.

## 2014-12-09 NOTE — Progress Notes (Signed)
   HPI  Patient presents today ere with continued left foot ulcer.  Patient explains that the ulcer is been there about 6 months, previously she stated 2 years, it has improved with the mupirocin ointment.e also improved with clindamycin previously.  It has not completely resolved. After more discussion she states that it began as a warty looking lesion  She has no other complaints today. He would like to changeto see a specialist in town if possible, she's been scheduled for a wound care appointment in [place]   PMH: Smoking status noted ROS: Per HPI  Objective: BP 149/72 mmHg  Pulse 58  Temp(Src) 97 F (36.1 C) (Oral)  Ht 5\' 2"  (1.575 m)  Wt 148 lb 6.4 oz (67.314 kg)  BMI 27.14 kg/m2 Gen: NAD, alert, cooperative with exam HEENT: NCAT CV: RRR, good S1/S2, no murmur Resp: CTABL, no wheezes, non-labored Ext: No edema, warm Neuro: Alert and oriented, No gross deficits  Skin: Left second toe with lesion improved from last visit now with some callus formation over the superior aspect of the wound, mild tenderness to palpation just above the callus no induration, no significant redness no warmth  Assessment and plan:  # left second toe ulcer After further discussion I think it's very possible that his pain as a left second toe bunion or wart that opening got infected. It's improved with clindamycin and now improved even more with mupirocin refer to podiatry as she would like to stay in town   Orders Placed This Encounter  Procedures  . Ambulatory referral to Podiatry    Referral Priority:  Routine    Referral Type:  Consultation    Referral Reason:  Specialty Services Required    Requested Specialty:  Podiatry    Number of Visits Requested:  [place], MD [facility] 12/09/2014, 3:16 PM

## 2014-12-16 ENCOUNTER — Ambulatory Visit (INDEPENDENT_AMBULATORY_CARE_PROVIDER_SITE_OTHER): Payer: Medicare Other | Admitting: Pharmacist

## 2014-12-16 ENCOUNTER — Other Ambulatory Visit: Payer: Self-pay | Admitting: Pharmacist

## 2014-12-16 ENCOUNTER — Encounter: Payer: Self-pay | Admitting: Pharmacist

## 2014-12-16 ENCOUNTER — Ambulatory Visit (INDEPENDENT_AMBULATORY_CARE_PROVIDER_SITE_OTHER): Payer: Medicare Other

## 2014-12-16 VITALS — BP 134/90 | HR 66 | Ht 62.0 in | Wt 149.5 lb

## 2014-12-16 DIAGNOSIS — Z780 Asymptomatic menopausal state: Secondary | ICD-10-CM

## 2014-12-16 DIAGNOSIS — H9193 Unspecified hearing loss, bilateral: Secondary | ICD-10-CM

## 2014-12-16 DIAGNOSIS — Z0000 Encounter for general adult medical examination without abnormal findings: Secondary | ICD-10-CM

## 2014-12-16 DIAGNOSIS — Z1211 Encounter for screening for malignant neoplasm of colon: Secondary | ICD-10-CM

## 2014-12-16 DIAGNOSIS — I480 Paroxysmal atrial fibrillation: Secondary | ICD-10-CM

## 2014-12-16 LAB — POCT INR: INR: 3.7

## 2014-12-16 MED ORDER — ALENDRONATE SODIUM 70 MG PO TABS: 70.0000 mg | ORAL_TABLET | ORAL | Status: DC

## 2014-12-16 NOTE — Patient Instructions (Addendum)
[name] , Thank you for taking time to come for your Medicare Wellness Visit. I appreciate your ongoing commitment to your health goals. Please review the following plan we discussed and let me know if I can assist you in the future.   These are the goals we discussed: Try to take zolpidem only if needed - can try to quarter the 10mg  tablets or if you would like we can resend in the 5mg  tablets and you can try 1/2  Try to do chair exercise to help with core strength and leg strength Will send referral to have hearing checked.    This is a list of the screening recommended for you and due dates:  Health Maintenance  Topic Date Due  . Tetanus Vaccine  03/03/1951 - cost would be $45  . Shingles Vaccine  03/03/1994 - cost would be $95  . Pneumonia vaccines (1 of 2 - PCV13) 03/04/1999 - checking records from Dr Edrick Oh to see if you have gotten this in past  . DEXA scan (bone density measurement)  10/30/2016 - done today  . Flu Shot  08/05/2015    Anticoagulation Dose Instructions as of 12/16/2014      [name] Tue Wed Thu Fri Sat   New Dose 5 mg 5 mg 5 mg 5 mg 5 mg 2.5 mg 5 mg    Description        No warfarin tomorrow - Tuesday, December 13th.   Then decrease warfarin dose of 5mg  to 1/2 tablet on Fridays and 1 tablet all other days.     INR was 3.8 today   Fall Prevention in the Home  Falls can cause injuries and can affect people from all age groups. There are many simple things that you can do to make your home safe and to help prevent falls. WHAT CAN I DO ON THE OUTSIDE OF MY HOME? 1. Regularly repair the edges of walkways and driveways and fix any cracks. 2. Remove high doorway thresholds. 3. Trim any shrubbery on the main path into your home. 4. Use bright outdoor lighting. 5. Clear walkways of debris and clutter, including tools and rocks. 6. Regularly check that handrails are securely fastened and in good repair. Both sides of any steps should have handrails. 7. Install  guardrails along the edges of any raised decks or porches. 8. Have leaves, snow, and ice cleared regularly. 9. Use sand or salt on walkways during winter months. 10. In the garage, clean up any spills right away, including grease or oil spills. WHAT CAN I DO IN THE BATHROOM?  Use night lights.  Install grab bars by the toilet and in the tub and shower. Do not use towel bars as grab bars.  Use non-skid mats or decals on the floor of the tub or shower.  If you need to sit down while you are in the shower, use a plastic, non-slip stool.Marland Kitchen  Keep the floor dry. Immediately clean up any water that spills on the floor.  Remove soap buildup in the tub or shower on a regular basis.  Attach bath mats securely with double-sided non-slip rug tape.  Remove throw rugs and other tripping hazards from the floor. WHAT CAN I DO IN THE BEDROOM?  Use night lights.  Make sure that a bedside light is easy to reach.  Do not use oversized bedding that drapes onto the floor.  Have a firm chair that has side arms to use for getting dressed.  Remove throw rugs  and other tripping hazards from the floor. WHAT CAN I DO IN THE KITCHEN?   Clean up any spills right away.  Avoid walking on wet floors.  Place frequently used items in easy-to-reach places.  If you need to reach for something above you, use a sturdy step stool that has a grab bar.  Keep electrical cables out of the way.  Do not use floor polish or wax that makes floors slippery. If you have to use wax, make sure that it is non-skid floor wax.  Remove throw rugs and other tripping hazards from the floor. WHAT CAN I DO IN THE STAIRWAYS?  Do not leave any items on the stairs.  Make sure that there are handrails on both sides of the stairs. Fix handrails that are broken or loose. Make sure that handrails are as long as the stairways.  Check any carpeting to make sure that it is firmly attached to the stairs. Fix any carpet that is loose  or worn.  Avoid having throw rugs at the top or bottom of stairways, or secure the rugs with carpet tape to prevent them from moving.  Make sure that you have a light switch at the top of the stairs and the bottom of the stairs. If you do not have them, have them installed. WHAT ARE SOME OTHER FALL PREVENTION TIPS?  Wear closed-toe shoes that fit well and support your feet. Wear shoes that have rubber soles or low heels.  When you use a stepladder, make sure that it is completely opened and that the sides are firmly locked. Have someone hold the ladder while you are using it. Do not climb a closed stepladder.  Add color or contrast paint or tape to grab bars and handrails in your home. Place contrasting color strips on the first and last steps.  Use mobility aids as needed, such as canes, walkers, scooters, and crutches.  Turn on lights if it is dark. Replace any light bulbs that burn out.  Set up furniture so that there are clear paths. Keep the furniture in the same spot.  Fix any uneven floor surfaces.  Choose a carpet design that does not hide the edge of steps of a stairway.  Be aware of any and all pets.  Review your medicines with your healthcare provider. Some medicines can cause dizziness or changes in blood pressure, which increase your risk of falling. Talk with your health care provider about other ways that you can decrease your risk of falls. This may include working with a physical therapist or trainer to improve your strength, balance, and endurance.   This information is not intended to replace advice given to you by your health care provider. Make sure you discuss any questions you have with your health care provider.   Document Released: 12/11/2001 Document Revised: 05/07/2014 Document Reviewed: 01/25/2014 Elsevier Interactive Patient Education 2016 [employer].   Kegel Exercises The goal of Kegel exercises is to isolate and exercise your pelvic floor muscles.  These muscles act as a hammock that supports the rectum, vagina, small intestine, and uterus. As the muscles weaken, the hammock sags and these organs are displaced from their normal positions. Kegel exercises can strengthen your pelvic floor muscles and help you to improve bladder and bowel control, improve sexual response, and help reduce many problems and some discomfort during pregnancy. Kegel exercises can be done anywhere and at any time. HOW TO PERFORM KEGEL EXERCISES 11. Locate your pelvic floor muscles. To do this,  squeeze (contract) the muscles that you use when you try to stop the flow of urine. You will feel a tightness in the vaginal area (women) and a tight lift in the rectal area (men and women). 12. When you begin, contract your pelvic muscles tight for 2-5 seconds, then relax them for 2-5 seconds. This is one set. Do 4-5 sets with a short pause in between. 13. Contract your pelvic muscles for 8-10 seconds, then relax them for 8-10 seconds. Do 4-5 sets. If you cannot contract your pelvic muscles for 8-10 seconds, try 5-7 seconds and work your way up to 8-10 seconds. Your goal is 4-5 sets of 10 contractions each day. Keep your stomach, buttocks, and legs relaxed during the exercises. Perform sets of both short and long contractions. Vary your positions. Perform these contractions 3-4 times per day. Perform sets while you are:   Lying in bed in the morning.  Standing at lunch.  Sitting in the late afternoon.  Lying in bed at night. You should do 40-50 contractions per day. Do not perform more Kegel exercises per day than recommended. Overexercising can cause muscle fatigue. Continue these exercises for for at least 15-20 weeks or as directed by your caregiver.   This information is not intended to replace advice given to you by your health care provider. Make sure you discuss any questions you have with your health care provider.   Document Released: 12/08/2011 Document Revised:  01/11/2014 Document Reviewed: 12/08/2011 Elsevier Interactive Patient Education [employer].

## 2014-12-16 NOTE — Progress Notes (Signed)
Patient ID: [name], female   DOB: 05/06/1932, 81 y.o.   MRN: [id number]    Subjective:   [name] is a 81 y.o. pleasant, white, female who presents for an Initial Medicare Annual Wellness Visit.  She is in NAD.    [name] is also here to recheck INR.  Review of Systems  Review of Systems  Constitutional: Negative.   HENT: Positive for hearing loss. Negative for congestion, ear discharge, ear pain, nosebleeds, sore throat and tinnitus.   Eyes: Negative.   Respiratory: Negative.  Negative for stridor.   Cardiovascular: Negative.   Gastrointestinal: Negative.   Genitourinary: Negative.   Musculoskeletal: Positive for back pain and falls. Negative for myalgias, joint pain and neck pain.  Skin: Negative.   Neurological: Negative.  Negative for headaches.  Endo/Heme/Allergies: Negative.   Psychiatric/Behavioral: Negative for depression, suicidal ideas, hallucinations, memory loss and substance abuse. The patient has insomnia. The patient is not nervous/anxious.     Current Medications (verified) Outpatient Encounter Prescriptions as of 12/16/2014  Medication Sig  . acetaminophen (TYLENOL) 500 MG tablet Take 500 mg by mouth every 6 (six) hours as needed for moderate pain.  Marland Kitchen atenolol (TENORMIN) 25 MG tablet Take 25 mg by mouth daily.   Marland Kitchen dicyclomine (BENTYL) 10 MG capsule Take 10 mg by mouth daily as needed (upset stomach).  . ergocalciferol (VITAMIN D2) 50000 UNITS capsule Take 50,000 Units by mouth once a week. Pt takes on sunday  . flecainide (TAMBOCOR) 100 MG tablet Take 100 mg by mouth 2 (two) times daily.   . fluticasone (FLONASE) 50 MCG/ACT nasal spray Use 2 sprays in each  nostril once daily  . furosemide (LASIX) 40 MG tablet Take 0.5-1 tablets (20-40 mg total) by mouth every morning.  . gabapentin (NEURONTIN) 100 MG capsule Take 1 tab in the morning  . GARLIC PO Take 1 tablet by mouth daily.  Marland Kitchen levothyroxine (SYNTHROID, LEVOTHROID) 50 MCG tablet Take 50 mcg by  mouth every morning.   . mupirocin ointment (BACTROBAN) 2 % Place 1 application into the nose 2 (two) times daily.  . potassium chloride SA (K-DUR,KLOR-CON) 20 MEQ tablet Take 20 mEq by mouth daily.   . traMADol (ULTRAM) 50 MG tablet Take 1 tablet (50 mg total) by mouth every 8 (eight) hours as needed.  . vitamin B-12 (CYANOCOBALAMIN) 500 MCG tablet Take 500 mcg by mouth daily.   Marland Kitchen warfarin (COUMADIN) 5 MG tablet Take 5-7.5 mg by mouth every morning. 7.5mg  on Wednesday and Saturday and 5 mg all other days.  Marland Kitchen zolpidem (AMBIEN) 10 MG tablet Take 0.5-1 tablets (5-10 mg total) by mouth at bedtime as needed for sleep. Take 1/2 to 1 tablet at bedtime as needed  . alendronate (FOSAMAX) 70 MG tablet Take 1 tablet (70 mg total) by mouth once a week. Take with a full glass of water on an empty stomach.  . meclizine (ANTIVERT) 25 MG tablet Take 25 mg by mouth daily as needed. Inner ear   No facility-administered encounter medications on file as of 12/16/2014.    Allergies (verified) Penicillins   History: Past Medical History  Diagnosis Date  . Atrial fibrillation (HCC)     Paroxysmal, Flecainide therapy  . Warfarin anticoagulation   . Drug therapy     Flecainide for atrial fibrillation  . Hypothyroidism   . Carotid bruit     Doppler, December, 2009, no abnormality  . Ejection fraction     EF normal, echo, 2003  //  EF 60-65%, echo, June 18, 123456, diastolic dysfunction,  . Normal nuclear stress test     Normal, 2001  . Diverticulosis   . HLD (hyperlipidemia)   . HTN (hypertension)   . Osteoarthritis   . Preop cardiovascular exam   . Calf pain     September, 2012, at rest  . PONV (postoperative nausea and vomiting)   . GERD (gastroesophageal reflux disease)   . Anemia   . Internal hemorrhoid   . Hypothyroidism   . Cataract   . Stroke ([place])   . Insomnia    Past Surgical History  Procedure Laterality Date  . Total knee arthroplasty  05/26/10    right  . Total shoulder  replacement  01/2010    left  . Shoulder open rotator cuff repair      bilateral  . Total knee arthroplasty  11/17/2010    Procedure: TOTAL KNEE ARTHROPLASTY;  Surgeon: Mauri Pole;  Location: [facility];  Service: Orthopedics;  Laterality: Left;  . Total knee arthroplasty      Right  . Cataract extraction w/phaco Right 04/15/2014    Procedure: CATARACT EXTRACTION PHACO AND INTRAOCULAR LENS PLACEMENT (IOC);  Surgeon: Tonny Branch, MD;  Location: [facility];  Service: Ophthalmology;  Laterality: Right;  CDE: 13.30  . Cataract extraction w/phaco Left 05/13/2014    Procedure: CATARACT EXTRACTION PHACO AND INTRAOCULAR LENS PLACEMENT LEFT EYE;  Surgeon: Tonny Branch, MD;  Location: [facility];  Service: Ophthalmology;  Laterality: Left;  CDE:13.00   Family History  Problem Relation Age of Onset  . COPD Brother   . Atrial fibrillation Brother   . Diabetes Brother   . Liver cancer Brother   . Colon cancer Brother   . Heart attack Father   . Rheum arthritis Mother   . Arthritis Mother   . Heart disease Mother   . Heart attack Daughter   . Coronary artery disease Brother    Social History   Occupational History  . Not on file.   Social History Main Topics  . Smoking status: Never Smoker   . Smokeless tobacco: Never Used  . Alcohol Use: No  . Drug Use: No  . Sexual Activity: No    Do you feel safe at home?  Yes  Dietary issues and exercise activities: Current Exercise Habits:: Home exercise routine, Type of exercise: Other - see comments (rower and stationary bike), Time (Minutes): 15, Frequency (Times/Week): 3, Weekly Exercise (Minutes/Week): 45, Intensity: Moderate  Current Dietary habits:  Patient is not following any specific diet recommendations at this time.   Objective:    Today's Vitals   12/16/14 1031  BP: 134/90  Pulse: 66  Height: 5\' 2"  (1.575 m)  Weight: 149 lb 8 oz (67.813 kg)  PainSc: 0-No pain   Body mass index is 27.34 kg/(m^2).   INR was 3.7 today  DEXA  results: 12/16/2014  Spine:  -0.3;  Neck left hip: -2.2;  Neck right hip: -2.1 10/30/2008  Spine: -0.7;  Neck left hip: -1.9;  Neck right hip: -2.0  Activities of Daily Living In your present state of health, do you have any difficulty performing the following activities: 12/16/2014 04/10/2014  Hearing? Y N  Vision? N N  Difficulty concentrating or making decisions? N N  Walking or climbing stairs? N N  Dressing or bathing? N N  Doing errands, shopping? N N  Preparing Food and eating ? N -  Using the Toilet? N -  In the past six months, have  you accidently leaked urine? Y -  Do you have problems with loss of bowel control? N -  Managing your Medications? N -  Managing your Finances? N -  Housekeeping or managing your Housekeeping? N -    Are there smokers in your home (other than you)? No   Cardiac Risk Factors include: advanced age (>60men, >62 women);family history of premature cardiovascular disease;hypertension  Depression Screen PHQ 2/9 Scores 12/16/2014 11/25/2014 11/12/2014 10/14/2014  PHQ - 2 Score 1 1 0 0    Fall Risk Fall Risk  12/16/2014 11/25/2014 11/12/2014 10/14/2014  Falls in the past year? Yes Yes Yes Yes  Number falls in past yr: 2 or more 2 or more 2 or more 1  Injury with Fall? No - No Yes  Risk Factor Category  High Fall Risk - - -  Follow up Falls prevention discussed - - -    Cognitive Function: MMSE - Mini Mental State Exam 12/16/2014  Orientation to time 4  Orientation to Place 5  Registration 3  Attention/ Calculation 3  Recall 3  Language- name 2 objects 2  Language- repeat 1  Language- follow 3 step command 3  Language- read & follow direction 0  Write a sentence 1  Copy design 0  Total score 25    Immunizations and Health Maintenance Immunization History  Administered Date(s) Administered  . Influenza-Unspecified 10/04/2014   Health Maintenance Due  Topic Date Due  . TETANUS/TDAP  03[birth year]03/1953  . ZOSTAVAX  03/03/1994  . PNA vac Low  Risk Adult (1 of 2 - PCV13) 03/04/1999    Patient Care Team: Timmothy Euler, MD as PCP - General (Family Medicine) Paralee Cancel, MD (Orthopedic Surgery)  Indicate any recent Medical Services you may have received from other than [facility] providers in the past year (date may be approximate).    Assessment:    Annual Wellness Visit  Decreased hearing Supratherapeutic anticoagulation High fall risk Osteopenia with high FRAX fracture estimate. Urinary incontinience  Screening Tests Health Maintenance  Topic Date Due  . TETANUS/TDAP  03/03/1953  . ZOSTAVAX  03/03/1994  . PNA vac Low Risk Adult (1 of 2 - PCV13) 03/04/1999  . INFLUENZA VACCINE  08/05/2015  . DEXA SCAN  12/15/2016      Plan:   During the course of the visit [name] was educated and counseled about the following appropriate screening and preventive services:   Vaccines - influenza UTD.  Patient declined Boostrix and Zostavax due to cost.  Also requested vaccine records from previous PCP Dr Edrick Oh to determine is pneumonia vaccines needed.  Colorectal cancer screening - FOBT given today for patient to return to office.  Cardiovascular disease screening - BP was good today;  Lipids when last checked were WNL - due to recheck at next PCP visit.  Diabetes screening - last fasting BG was WNL  Bone Denisty / Osteoporosis Screening - BMD checked today.  Start alendronate 70mg  take 1 tablet weekly on empty stomach with full glass of water.    Mammogram - patient declined  PAP - N/A  Nutrition counseling - Discussed ways to increase calcium intake with changes in diet.    Advanced Directives - discussed and handout given  Referral sent for hearing assessment with audiologist.  Fall prevention discussed   Increase weight bearing exercise and also gave information regarding chair exercises.    Discussed and information given for Kegel exercises   Anticoagulation Dose Instructions as of 12/16/2014      Nancy Fetter  Mon Tue  Wed Thu Fri Sat   New Dose 5 mg 5 mg 5 mg 5 mg 5 mg 2.5 mg 5 mg    Description        No warfarin tomorrow - Tuesday, December 13th.   Then decrease warfarin dose of 5mg  to 1/2 tablet on Fridays and 1 tablet all other days.       Patient Instructions (the written plan) were given to the patient.   Cherre Robins, [facility]   12/16/2014

## 2014-12-17 ENCOUNTER — Other Ambulatory Visit: Payer: Self-pay | Admitting: Family

## 2014-12-17 DIAGNOSIS — I70203 Unspecified atherosclerosis of native arteries of extremities, bilateral legs: Secondary | ICD-10-CM | POA: Diagnosis not present

## 2014-12-17 DIAGNOSIS — L84 Corns and callosities: Secondary | ICD-10-CM | POA: Diagnosis not present

## 2014-12-17 DIAGNOSIS — B351 Tinea unguium: Secondary | ICD-10-CM | POA: Diagnosis not present

## 2014-12-17 MED ORDER — GABAPENTIN 100 MG PO CAPS: ORAL_CAPSULE | ORAL | Status: DC

## 2014-12-17 MED ORDER — TRAMADOL HCL 50 MG PO TABS: 50.0000 mg | ORAL_TABLET | Freq: Three times a day (TID) | ORAL | Status: DC | PRN

## 2014-12-17 MED ORDER — ZOLPIDEM TARTRATE 10 MG PO TABS: 5.0000 mg | ORAL_TABLET | Freq: Every evening | ORAL | Status: DC | PRN

## 2014-12-17 NOTE — Telephone Encounter (Signed)
RX ready for pick up 

## 2014-12-17 NOTE — Telephone Encounter (Signed)
Patient notified that rx up front ready for pick and other rx sent to mail order

## 2014-12-23 ENCOUNTER — Encounter (HOSPITAL_BASED_OUTPATIENT_CLINIC_OR_DEPARTMENT_OTHER): Payer: Medicare Other

## 2014-12-26 ENCOUNTER — Other Ambulatory Visit: Payer: Medicare Other

## 2014-12-26 DIAGNOSIS — Z1211 Encounter for screening for malignant neoplasm of colon: Secondary | ICD-10-CM

## 2014-12-26 DIAGNOSIS — Z1212 Encounter for screening for malignant neoplasm of rectum: Secondary | ICD-10-CM

## 2014-12-26 NOTE — Progress Notes (Signed)
Lab only 

## 2014-12-29 LAB — FECAL OCCULT BLOOD, IMMUNOCHEMICAL: Fecal Occult Bld: NEGATIVE

## 2014-12-30 ENCOUNTER — Emergency Department (HOSPITAL_COMMUNITY)
Admission: EM | Admit: 2014-12-30 | Discharge: 2014-12-30 | Disposition: A | Discharge status: Home / Self Care | Payer: Medicare Other | Attending: Emergency Medicine | Admitting: Emergency Medicine

## 2014-12-30 ENCOUNTER — Inpatient Hospital Stay (HOSPITAL_COMMUNITY): Admit: 2014-12-30 | Payer: Medicare Other

## 2014-12-30 ENCOUNTER — Emergency Department (HOSPITAL_COMMUNITY): Payer: Medicare Other

## 2014-12-30 ENCOUNTER — Encounter (HOSPITAL_COMMUNITY): Payer: Self-pay | Admitting: *Deleted

## 2014-12-30 DIAGNOSIS — Z79899 Other long term (current) drug therapy: Secondary | ICD-10-CM | POA: Insufficient documentation

## 2014-12-30 DIAGNOSIS — G4730 Sleep apnea, unspecified: Secondary | ICD-10-CM | POA: Insufficient documentation

## 2014-12-30 DIAGNOSIS — R142 Eructation: Secondary | ICD-10-CM

## 2014-12-30 DIAGNOSIS — I10 Essential (primary) hypertension: Secondary | ICD-10-CM | POA: Diagnosis not present

## 2014-12-30 DIAGNOSIS — Z8619 Personal history of other infectious and parasitic diseases: Secondary | ICD-10-CM | POA: Diagnosis not present

## 2014-12-30 DIAGNOSIS — Z7951 Long term (current) use of inhaled steroids: Secondary | ICD-10-CM | POA: Insufficient documentation

## 2014-12-30 DIAGNOSIS — Z8673 Personal history of transient ischemic attack (TIA), and cerebral infarction without residual deficits: Secondary | ICD-10-CM | POA: Insufficient documentation

## 2014-12-30 DIAGNOSIS — Z7901 Long term (current) use of anticoagulants: Secondary | ICD-10-CM | POA: Diagnosis not present

## 2014-12-30 DIAGNOSIS — K219 Gastro-esophageal reflux disease without esophagitis: Secondary | ICD-10-CM | POA: Diagnosis not present

## 2014-12-30 DIAGNOSIS — Z880 Allergy status to penicillin: Secondary | ICD-10-CM | POA: Insufficient documentation

## 2014-12-30 DIAGNOSIS — E039 Hypothyroidism, unspecified: Secondary | ICD-10-CM | POA: Diagnosis not present

## 2014-12-30 DIAGNOSIS — R079 Chest pain, unspecified: Secondary | ICD-10-CM | POA: Diagnosis not present

## 2014-12-30 DIAGNOSIS — R0789 Other chest pain: Secondary | ICD-10-CM

## 2014-12-30 LAB — CBC
HCT: 39.4 % (ref 36.0–46.0)
Hemoglobin: 12.9 g/dL (ref 12.0–15.0)
MCH: 30.9 pg (ref 26.0–34.0)
MCHC: 32.7 g/dL (ref 30.0–36.0)
MCV: 94.3 fL (ref 78.0–100.0)
PLATELETS: 253 10*3/uL (ref 150–400)
RBC: 4.18 MIL/uL (ref 3.87–5.11)
RDW: 14 % (ref 11.5–15.5)
WBC: 9.5 10*3/uL (ref 4.0–10.5)

## 2014-12-30 LAB — BASIC METABOLIC PANEL
Anion gap: 10 (ref 5–15)
BUN: 16 mg/dL (ref 6–20)
CALCIUM: 8.8 mg/dL — AB (ref 8.9–10.3)
CO2: 24 mmol/L (ref 22–32)
CREATININE: 0.67 mg/dL (ref 0.44–1.00)
Chloride: 101 mmol/L (ref 101–111)
GFR calc Af Amer: >60 mL/min (ref >60)
GFR calc non Af Amer: >60 mL/min (ref >60)
GLUCOSE: 110 mg/dL — AB (ref 65–99)
Potassium: 3.6 mmol/L (ref 3.5–5.1)
Sodium: 135 mmol/L (ref 135–145)

## 2014-12-30 LAB — PROTIME-INR
INR: 2.29 — ABNORMAL HIGH (ref 0.00–1.49)
Prothrombin Time: 25 seconds — ABNORMAL HIGH (ref 11.6–15.2)

## 2014-12-30 LAB — HEPATIC FUNCTION PANEL
ALK PHOS: 51 U/L (ref 38–126)
ALT: 16 U/L (ref 14–54)
AST: 25 U/L (ref 15–41)
Albumin: 4.2 g/dL (ref 3.5–5.0)
BILIRUBIN DIRECT: 0.1 mg/dL (ref 0.1–0.5)
BILIRUBIN TOTAL: 0.2 mg/dL — AB (ref 0.3–1.2)
Indirect Bilirubin: 0.1 mg/dL — ABNORMAL LOW (ref 0.3–0.9)
Total Protein: 7.3 g/dL (ref 6.5–8.1)

## 2014-12-30 LAB — LIPASE, BLOOD: Lipase: 23 U/L (ref 11–51)

## 2014-12-30 LAB — MAGNESIUM: Magnesium: 1.9 mg/dL (ref 1.7–2.4)

## 2014-12-30 LAB — TROPONIN I: TROPONIN I: 0.03 ng/mL (ref <0.031)

## 2014-12-30 MED ORDER — ASPIRIN 81 MG PO CHEW
324.0000 mg | CHEWABLE_TABLET | Freq: Once | ORAL | Status: AC
  Administered 2014-12-30: 324 mg via ORAL
  Filled 2014-12-30: qty 4

## 2014-12-30 MED ORDER — NITROGLYCERIN 2 % TD OINT
1.0000 [in_us] | TOPICAL_OINTMENT | Freq: Once | TRANSDERMAL | Status: AC
  Administered 2014-12-30: 1 [in_us] via TOPICAL
  Filled 2014-12-30: qty 1

## 2014-12-30 MED ORDER — FENTANYL CITRATE (PF) 100 MCG/2ML IJ SOLN
25.0000 ug | Freq: Once | INTRAMUSCULAR | Status: AC
Start: 2014-12-30 — End: 2014-12-30
  Administered 2014-12-30: 25 ug via INTRAVENOUS
  Filled 2014-12-30: qty 2

## 2014-12-30 MED ORDER — OMEPRAZOLE 20 MG PO CPDR: DELAYED_RELEASE_CAPSULE | ORAL | Status: DC

## 2014-12-30 MED ORDER — GI COCKTAIL ~~LOC~~
30.0000 mL | Freq: Once | ORAL | Status: AC
  Administered 2014-12-30: 30 mL via ORAL
  Filled 2014-12-30: qty 30

## 2014-12-30 MED ORDER — IOHEXOL 350 MG/ML SOLN
100.0000 mL | Freq: Once | INTRAVENOUS | Status: AC | PRN
  Administered 2014-12-30: 100 mL via INTRAVENOUS

## 2014-12-30 MED ORDER — FAMOTIDINE IN NACL 20-0.9 MG/50ML-% IV SOLN
20.0000 mg | Freq: Once | INTRAVENOUS | Status: AC
  Administered 2014-12-30: 20 mg via INTRAVENOUS
  Filled 2014-12-30: qty 50

## 2014-12-30 NOTE — Discharge Instructions (Signed)
Avoid fried, spicy or greasy foods. Take the prilosec as prescribed. You are scheduled for an outpatient ultrasound of your gallbladder. Your appointment is on Dec 26, later today at 11am. Do not eat or drink until after your test. You should arrive 15 minutes early to check in at Radiology. You should let Dr McDowell's office know about your ED visit tonight. Also consider seeing a gastroenterologist, Dr Gala Romney if you continue to have belching and upper abdominal pain.

## 2014-12-30 NOTE — ED Notes (Signed)
Pt c/o central chest pain that radiates to her shoulers; pain described as a pressure

## 2014-12-30 NOTE — ED Notes (Signed)
Pt ambulated to restroom & returned to room w/ no complications. 

## 2014-12-30 NOTE — ED Provider Notes (Signed)
CSN: [id number]     Arrival date & time 12/30/14  0034 History   First MD Initiated Contact with Patient 12/30/14 0145     Chief Complaint  Patient presents with  . Chest Pain     (Consider location/radiation/quality/duration/timing/severity/associated sxs/prior Treatment) HPI patient reports acute onset of lower chest pain that shoots between her shoulder blades that started about 1 AM this morning when she was bringing her husband to the ED to be evaluated. She describes the pain as burning and pressure and sharp. She states it still present. She denies shortness of breath, nausea, or vomiting. She states she is having a lot of burping. She states she's never had this before. She denies coughing, shortness of breath, diaphoresis, fever but she states she's had a mild sore throat for a few weeks and some rhinorrhea today. She states she has had her flu shot. She does admit she was upset because her husband had to come to the ED.  She reports she sees a cardiologist for atrial fibrillation. She states all of her 6 brothers had heart disease, her father died age 15 of a MI, she had a daughter age 57 who had sudden death felt to be from a heart problem.  PCP Dr Wendi [facility] [place]  Past Medical History  Diagnosis Date  . Atrial fibrillation (HCC)     Paroxysmal, Flecainide therapy  . Warfarin anticoagulation   . Drug therapy     Flecainide for atrial fibrillation  . Hypothyroidism   . Carotid bruit     Doppler, December, 2009, no abnormality  . Ejection fraction     EF normal, echo, 2003  //   EF 60-65%, echo, June 18, [id number], diastolic dysfunction,  . Normal nuclear stress test     Normal, 2001  . Diverticulosis   . HLD (hyperlipidemia)   . HTN (hypertension)   . Osteoarthritis   . Preop cardiovascular exam   . Calf pain     September, 2012, at rest  . PONV (postoperative nausea and vomiting)   . GERD (gastroesophageal reflux disease)   . Anemia   . Internal hemorrhoid    . Hypothyroidism   . Cataract   . Stroke ([place])   . Insomnia   . History of shingles    Past Surgical History  Procedure Laterality Date  . Total knee arthroplasty  05/26/10    right  . Total shoulder replacement  01/2010    left  . Shoulder open rotator cuff repair      bilateral  . Total knee arthroplasty  11/17/2010    Procedure: TOTAL KNEE ARTHROPLASTY;  Surgeon: Mauri Pole;  Location: [facility];  Service: Orthopedics;  Laterality: Left;  . Total knee arthroplasty      Right  . Cataract extraction w/phaco Right 04/15/2014    Procedure: CATARACT EXTRACTION PHACO AND INTRAOCULAR LENS PLACEMENT (IOC);  Surgeon: Tonny Branch, MD;  Location: [facility];  Service: Ophthalmology;  Laterality: Right;  CDE: 13.30  . Cataract extraction w/phaco Left 05/13/2014    Procedure: CATARACT EXTRACTION PHACO AND INTRAOCULAR LENS PLACEMENT LEFT EYE;  Surgeon: Tonny Branch, MD;  Location: [facility];  Service: Ophthalmology;  Laterality: Left;  CDE:13.00   Family History  Problem Relation Age of Onset  . COPD Brother   . Atrial fibrillation Brother   . Diabetes Brother   . Liver cancer Brother   . Colon cancer Brother   . Heart attack Father   . Rheum arthritis Mother   .  Arthritis Mother   . Heart disease Mother   . Heart attack Daughter   . Coronary artery disease Brother    Social History  Substance Use Topics  . Smoking status: Never Smoker   . Smokeless tobacco: Never Used  . Alcohol Use: No   Lives with spouse  OB History    No data available     Review of Systems  All other systems reviewed and are negative.     Allergies  Penicillins  Home Medications   Prior to Admission medications   Medication Sig Start Date End Date Taking? Authorizing Provider  acetaminophen (TYLENOL) 500 MG tablet Take 500 mg by mouth every 6 (six) hours as needed for moderate pain.    Historical Provider, MD  alendronate (FOSAMAX) 70 MG tablet Take 1 tablet (70 mg total) by mouth once a week. Take with a  full glass of water on an empty stomach. 12/16/14   Tammy Eckard, PHARMD  atenolol (TENORMIN) 25 MG tablet Take 25 mg by mouth daily.     Historical Provider, MD  dicyclomine (BENTYL) 10 MG capsule Take 10 mg by mouth daily as needed (upset stomach).    Historical Provider, MD  ergocalciferol (VITAMIN D2) 50000 UNITS capsule Take 50,000 Units by mouth once a week. Pt takes on sunday    Historical Provider, MD  flecainide (TAMBOCOR) 100 MG tablet Take 100 mg by mouth 2 (two) times daily.     Historical Provider, MD  fluticasone Asencion Islam) 50 MCG/ACT nasal spray Use 2 sprays in each  nostril once daily 11/01/14   Historical Provider, MD  furosemide (LASIX) 40 MG tablet Take 0.5-1 tablets (20-40 mg total) by mouth every morning. 11/25/14   Timmothy Euler, MD  gabapentin (NEURONTIN) 100 MG capsule Take 1 tab in the morning 12/17/14   Sharion Balloon, FNP  GARLIC PO Take 1 tablet by mouth daily.    Historical Provider, MD  levothyroxine (SYNTHROID, LEVOTHROID) 50 MCG tablet Take 50 mcg by mouth every morning.     Historical Provider, MD  meclizine (ANTIVERT) 25 MG tablet Take 25 mg by mouth daily as needed. Inner ear    Historical Provider, MD  mupirocin ointment (BACTROBAN) 2 % Place 1 application into the nose 2 (two) times daily. 11/25/14   Timmothy Euler, MD  omeprazole (PRILOSEC) 20 MG capsule Take 1 po BID x 2 weeks then once a day 12/30/14   Rolland Porter, MD  potassium chloride SA (K-DUR,KLOR-CON) 20 MEQ tablet Take 20 mEq by mouth daily.  10/19/12   Historical Provider, MD  traMADol (ULTRAM) 50 MG tablet Take 1 tablet (50 mg total) by mouth every 8 (eight) hours as needed. 12/17/14   Sharion Balloon, FNP  vitamin B-12 (CYANOCOBALAMIN) 500 MCG tablet Take 500 mcg by mouth daily.     Historical Provider, MD  warfarin (COUMADIN) 5 MG tablet Take 5-7.5 mg by mouth every morning. 7.5mg  on Wednesday and Saturday and 5 mg all other days.    Historical Provider, MD  zolpidem (AMBIEN) 10 MG tablet Take  0.5-1 tablets (5-10 mg total) by mouth at bedtime as needed for sleep. Take 1/2 to 1 tablet at bedtime as needed 12/17/14   Sharion Balloon, FNP   BP 180/78 mmHg  Pulse 75  Temp(Src) 97.8 F (36.6 C) (Oral)  Resp 20  Ht 5\' 3"  (1.6 m)  Wt 150 lb (68.04 kg)  BMI 26.58 kg/m2  SpO2 94%  Vital signs normal except for hypertension  Physical Exam  Constitutional: She is oriented to person, place, and time. She appears well-developed and well-nourished.  Non-toxic appearance. She does not appear ill. No distress.  HENT:  Head: Normocephalic and atraumatic.  Right Ear: External ear normal.  Left Ear: External ear normal.  Nose: Nose normal. No mucosal edema or rhinorrhea.  Mouth/Throat: Oropharynx is clear and moist and mucous membranes are normal. No dental abscesses or uvula swelling.  Eyes: Conjunctivae and EOM are normal. Pupils are equal, round, and reactive to light.  Neck: Normal range of motion and full passive range of motion without pain. Neck supple.  Cardiovascular: Normal rate, regular rhythm and normal heart sounds.  Exam reveals no gallop and no friction rub.   No murmur heard. Pulmonary/Chest: Effort normal and breath sounds normal. No respiratory distress. She has no wheezes. She has no rhonchi. She has no rales. She exhibits no tenderness and no crepitus.    Chest pain indicated  Abdominal: Soft. Normal appearance and bowel sounds are normal. She exhibits no distension. There is no tenderness. There is no rebound and no guarding.  Musculoskeletal: Normal range of motion. She exhibits no edema or tenderness.  Moves all extremities well.   Neurological: She is alert and oriented to person, place, and time. She has normal strength. No cranial nerve deficit.  Skin: Skin is warm, dry and intact. No rash noted. No erythema. No pallor.  Psychiatric: She has a normal mood and affect. Her speech is normal and behavior is normal. Her mood appears not anxious.  Nursing note and  vitals reviewed.   ED Course  Procedures (including critical care time)  Medications  nitroGLYCERIN (NITROGLYN) 2 % ointment 1 inch (1 inch Topical Given 12/30/14 0210)  aspirin chewable tablet 324 mg (324 mg Oral Given 12/30/14 0209)  fentaNYL (SUBLIMAZE) injection 25 mcg (25 mcg Intravenous Given 12/30/14 0503)  gi cocktail (Maalox,Lidocaine,Donnatal) (30 mLs Oral Given 12/30/14 0500)  famotidine (PEPCID) IVPB 20 mg premix (0 mg Intravenous Stopped 12/30/14 0532)  iohexol (OMNIPAQUE) 350 MG/ML injection 100 mL (100 mLs Intravenous Contrast Given 12/30/14 0446)    Patient was given aspirin and nitroglycerin 1 inch to her chest for her chest pain.  2:45 AM she was rechecked. She states her pain is a little bit better. We discussed her lab results and need to get a delta troponin done.  Recheck at 4:10 AM patient still has some discomfort stating it's starting to return and get between her shoulder blades. She was given a GI cocktail, IV Pepcid because she states she feels like she has to burp continuously, and fentanyl for her pain. At this time we discussed getting CT angios to make sure she is not having a problem with her aorta. Also lab work was ordered because she was questioning whether she had gallstones and liver function tests and lipase was added.  At time of discharge patient was given her test results. She was advised to start taking omeprazole and follow up with GI if she continues 10 to have epigastric abdominal pain and belching. She also should let her cardiologist know about her ED visit. Her last stress test was in 2001.   Labs Review Results for orders placed or performed during the hospital encounter of [id number]  Basic metabolic panel  Result Value Ref Range   Sodium 135 135 - 145 mmol/L   Potassium 3.6 3.5 - 5.1 mmol/L   Chloride 101 101 - 111 mmol/L   CO2 24 22 - 32 mmol/L  Glucose, Bld 110 (H) 65 - 99 mg/dL   BUN 16 6 - 20 mg/dL   Creatinine, Ser 0.67 0.44 -  1.00 mg/dL   Calcium 8.8 (L) 8.9 - 10.3 mg/dL   GFR calc non Af Amer >60 >60 mL/min   GFR calc Af Amer >60 >60 mL/min   Anion gap 10 5 - 15  CBC  Result Value Ref Range   WBC 9.5 4.0 - 10.5 K/uL   RBC 4.18 3.87 - 5.11 MIL/uL   Hemoglobin 12.9 12.0 - 15.0 g/dL   HCT 39.4 36.0 - 46.0 %   MCV 94.3 78.0 - 100.0 fL   MCH 30.9 26.0 - 34.0 pg   MCHC 32.7 30.0 - 36.0 g/dL   RDW 14.0 11.5 - 15.5 %   Platelets 253 150 - 400 K/uL  Troponin I  Result Value Ref Range   Troponin I <0.03 <0.031 ng/mL  Protime-INR - (order if Patient is taking Coumadin / Warfarin)  Result Value Ref Range   Prothrombin Time 25.0 (H) 11.6 - 15.2 seconds   INR 2.29 (H) 0.00 - 1.49  Magnesium  Result Value Ref Range   Magnesium 1.9 1.7 - 2.4 mg/dL  Troponin I  Result Value Ref Range   Troponin I 0.03 <0.031 ng/mL  Hepatic function panel  Result Value Ref Range   Total Protein 7.3 6.5 - 8.1 g/dL   Albumin 4.2 3.5 - 5.0 g/dL   AST 25 15 - 41 U/L   ALT 16 14 - 54 U/L   Alkaline Phosphatase 51 38 - 126 U/L   Total Bilirubin 0.2 (L) 0.3 - 1.2 mg/dL   Bilirubin, Direct 0.1 0.1 - 0.5 mg/dL   Indirect Bilirubin 0.1 (L) 0.3 - 0.9 mg/dL  Lipase, blood  Result Value Ref Range   Lipase 23 11 - 51 U/L    Laboratory interpretation all normal except therapeutic INR    Imaging Review Dg Chest 2 View  12/30/2014  CLINICAL DATA:  Acute onset of central chest pain, radiating to the shoulders. Initial encounter. EXAM: CHEST  2 VIEW COMPARISON:  Chest radiograph performed 08/10/2013 FINDINGS: The lungs are well-aerated. A small calcified granuloma is suggested at the right midlung zone. There is no evidence of focal opacification, pleural effusion or pneumothorax. The heart is normal in size; the mediastinal contour is within normal limits. No acute osseous abnormalities are seen. A left shoulder arthroplasty is partially imaged. IMPRESSION: No acute cardiopulmonary process seen. Electronically Signed   By: Garald Balding  M.D.   On: 12/30/2014 02:08   Ct Angio Chest Aorta W/cm &/or Wo/cm  12/30/2014  CLINICAL DATA:  Central chest pain and pressure radiating to shoulders. History of atrial fibrillation, into coagulation, hypertension, hyperlipidemia. EXAM: CT ANGIOGRAPHY CHEST WITH CONTRAST TECHNIQUE: Multidetector CT imaging of the chest was performed using the standard protocol during bolus administration of intravenous contrast. Multiplanar CT image reconstructions and MIPs were obtained to evaluate the vascular anatomy. CONTRAST:  131mL OMNIPAQUE IOHEXOL 350 MG/ML SOLN COMPARISON:  Chest radiograph December 30, 2014 FINDINGS: No abnormal density along the course of the thoracic aorta on precontrast imaging. Thoracic aorta is normal in course caliber. Mild calcific atherosclerosis of the aortic arch. No dissection, luminal irregularity, aneurysm, periaortic fluid collection or contrast extravasation. Though not tailored for evaluation, no pulmonary embolism. No lymphadenopathy by CT size criteria. The heart is mildly enlarged. Minimal coronary artery calcifications. No pericardial effusions. 2 mm RIGHT upper lobe, 2 mm RIGHT upper lobe pulmonary nodule. No  pleural effusion or focal consolidation. No pleural effusion. Dependent atelectasis. Tracheobronchial tree is patent and midline. No pneumothorax. Mildly elevated RIGHT hemidiaphragm. 13 mm low-density lesion in the spleen may represent lymphangioma or cyst. Thickened adrenal glands without discrete nodule. 2.2 cm RIGHT upper pole renal cyst. Status post LEFT shoulder arthroplasty. Minimal degenerative change, less than expected for age. IMPRESSION: Mild cardiomegaly.  No acute pulmonary process. Mild calcific atherosclerosis of the aortic arch, no acute vascular process. Two 2 mm RIGHT upper lobe pulmonary nodules. If the patient is at high risk for bronchogenic carcinoma, follow-up chest CT at 1 year is recommended. If the patient is at low risk, no follow-up is needed.  This recommendation follows the consensus statement: Guidelines for Management of Small Pulmonary Nodules Detected on CT Scans: A Statement from the [employer] as published in Radiology 2005; 237:395-400. Electronically Signed   By: Elon Alas M.D.   On: 12/30/2014 05:39   I have personally reviewed and evaluated these images and lab results as part of my medical decision-making.   EKG Interpretation   Date/Time:  Monday December 30 2014 01:04:57 EST Ventricular Rate:  73 PR Interval:  293 QRS Duration: 111 QT Interval:  427 QTC Calculation: 470 R Axis:   60 Text Interpretation:  Sinus rhythm Ventricular trigeminy Prolonged PR  interval Low voltage with right axis deviation Consider anterior infarct  Since last tracing 10 Apr 2014 Premature ventricular complexes are now  Present Confirmed by Sayyid Harewood  MD-I, Sharnita Bogucki ([id number]) on 12/30/2014 1:34:46 AM      MDM   Final diagnoses:  Atypical chest pain  Burping    New Prescriptions   OMEPRAZOLE (PRILOSEC) 20 MG CAPSULE    Take 1 po BID x 2 weeks then once a day    Plan discharge  Rolland Porter, MD, Barbette Or, MD 12/30/14 0630

## 2014-12-31 ENCOUNTER — Ambulatory Visit (INDEPENDENT_AMBULATORY_CARE_PROVIDER_SITE_OTHER): Payer: Medicare Other | Admitting: Pharmacist

## 2014-12-31 DIAGNOSIS — I480 Paroxysmal atrial fibrillation: Secondary | ICD-10-CM

## 2014-12-31 LAB — POCT INR: INR: 2.8

## 2014-12-31 NOTE — Patient Instructions (Signed)
Anticoagulation Dose Instructions as of 12/31/2014      [name] Tue Wed Thu Fri Sat   New Dose 5 mg 5 mg 5 mg 5 mg 5 mg 2.5 mg 5 mg    Description        Continue current warfarin dose of 5mg  - take 1/2 tablet on Fridays and 1 tablet all other days.     INR was 2.8 today

## 2015-01-07 ENCOUNTER — Encounter: Payer: Self-pay | Admitting: Family Medicine

## 2015-01-07 ENCOUNTER — Telehealth: Payer: Self-pay | Admitting: Family Medicine

## 2015-01-07 ENCOUNTER — Ambulatory Visit (INDEPENDENT_AMBULATORY_CARE_PROVIDER_SITE_OTHER): Payer: Medicare Other | Admitting: Family Medicine

## 2015-01-07 VITALS — BP 173/74 | HR 65 | Temp 97.8°F | Ht 63.0 in | Wt 146.4 lb

## 2015-01-07 DIAGNOSIS — H66001 Acute suppurative otitis media without spontaneous rupture of ear drum, right ear: Secondary | ICD-10-CM | POA: Diagnosis not present

## 2015-01-07 DIAGNOSIS — Z7901 Long term (current) use of anticoagulants: Secondary | ICD-10-CM

## 2015-01-07 DIAGNOSIS — R0789 Other chest pain: Secondary | ICD-10-CM | POA: Diagnosis not present

## 2015-01-07 MED ORDER — CEFDINIR 300 MG PO CAPS: 300.0000 mg | ORAL_CAPSULE | Freq: Two times a day (BID) | ORAL | Status: DC

## 2015-01-07 NOTE — Progress Notes (Signed)
   HPI  Patient presents today today for hospital follow-up.  She was admitted to 80 in hospital for atypical chest pain. She states that she had improvement with a GI cocktail. She's concerned about her gallbladder and has had upset stomach with slight nausea after eating w since leaving the hospital. She is unable to get to the initial ultrasound that was scheduled and so has rescheduled. He describes more right upper quadrant discomfort rather than chest pain  She describes cough, sinus pressure, sinus pain maxillary, and right ear pain over the last 5 days. She also has mild malaise. She gets itchy with penicillin but has no rash or throat symptoms  PMH: Smoking status noted ROS: Per HPI  Objective: BP 173/74 mmHg  Pulse 65  Temp(Src) 97.8 F (36.6 C) (Oral)  Ht 5\' 3"  (1.6 m)  Wt 146 lb 6.4 oz (66.407 kg)  BMI 25.94 kg/m2 Gen: NAD, alert, cooperative with exam HEENT: NCAT, right TM erythematous and bulging with purulent material apparent on the TM, bilateral maxillary sinus tenderness to palpation CV: RRR, good S1/S2, no murmur Resp: Nonlabored, no added sounds, left lower lobe with slightly decreased sounds compared to the right Abd: SNTND, BS present, no guarding or organomegaly Ext: No edema, warm Neuro: Alert and oriented, No gross deficits  Assessment and plan:  # Atypical chest pain  Has had one similar episode that lasted only a few minutes a few nights ago ACS was ruled out Suspicion for gallbladder disease, ultrasound to be arranged by patient  # Acute otitis media, acute maxillary sinusitis Treating with Omnicef, I believe that she will tolerate this easily despite her penicillin allergy She is on Coumadin, recheck INR in 1 week  Laroy Apple, MD [facility] 01/07/2015, 3:41 PM

## 2015-01-07 NOTE — Telephone Encounter (Signed)
appt made

## 2015-01-11 ENCOUNTER — Observation Stay (HOSPITAL_COMMUNITY)
Admission: EM | Admit: 2015-01-11 | Discharge: 2015-01-16 | Disposition: A | Discharge status: Home / Self Care | Payer: Medicare Other | Attending: Internal Medicine | Admitting: Internal Medicine

## 2015-01-11 ENCOUNTER — Encounter (HOSPITAL_COMMUNITY): Payer: Self-pay | Admitting: Emergency Medicine

## 2015-01-11 ENCOUNTER — Emergency Department (HOSPITAL_COMMUNITY): Payer: Medicare Other

## 2015-01-11 DIAGNOSIS — R531 Weakness: Secondary | ICD-10-CM | POA: Diagnosis not present

## 2015-01-11 DIAGNOSIS — R7989 Other specified abnormal findings of blood chemistry: Secondary | ICD-10-CM | POA: Diagnosis present

## 2015-01-11 DIAGNOSIS — K8010 Calculus of gallbladder with chronic cholecystitis without obstruction: Principal | ICD-10-CM | POA: Insufficient documentation

## 2015-01-11 DIAGNOSIS — I10 Essential (primary) hypertension: Secondary | ICD-10-CM | POA: Diagnosis not present

## 2015-01-11 DIAGNOSIS — E785 Hyperlipidemia, unspecified: Secondary | ICD-10-CM | POA: Diagnosis not present

## 2015-01-11 DIAGNOSIS — K922 Gastrointestinal hemorrhage, unspecified: Secondary | ICD-10-CM

## 2015-01-11 DIAGNOSIS — D649 Anemia, unspecified: Secondary | ICD-10-CM | POA: Diagnosis not present

## 2015-01-11 DIAGNOSIS — R1013 Epigastric pain: Secondary | ICD-10-CM | POA: Diagnosis not present

## 2015-01-11 DIAGNOSIS — K8020 Calculus of gallbladder without cholecystitis without obstruction: Secondary | ICD-10-CM | POA: Diagnosis not present

## 2015-01-11 DIAGNOSIS — Z7901 Long term (current) use of anticoagulants: Secondary | ICD-10-CM | POA: Diagnosis not present

## 2015-01-11 DIAGNOSIS — A047 Enterocolitis due to Clostridium difficile: Secondary | ICD-10-CM | POA: Diagnosis not present

## 2015-01-11 DIAGNOSIS — Z79899 Other long term (current) drug therapy: Secondary | ICD-10-CM | POA: Insufficient documentation

## 2015-01-11 DIAGNOSIS — Z880 Allergy status to penicillin: Secondary | ICD-10-CM | POA: Diagnosis not present

## 2015-01-11 DIAGNOSIS — E871 Hypo-osmolality and hyponatremia: Secondary | ICD-10-CM | POA: Diagnosis not present

## 2015-01-11 DIAGNOSIS — R112 Nausea with vomiting, unspecified: Secondary | ICD-10-CM | POA: Diagnosis not present

## 2015-01-11 DIAGNOSIS — K449 Diaphragmatic hernia without obstruction or gangrene: Secondary | ICD-10-CM | POA: Diagnosis not present

## 2015-01-11 DIAGNOSIS — E039 Hypothyroidism, unspecified: Secondary | ICD-10-CM | POA: Diagnosis present

## 2015-01-11 DIAGNOSIS — R0601 Orthopnea: Secondary | ICD-10-CM | POA: Insufficient documentation

## 2015-01-11 DIAGNOSIS — I493 Ventricular premature depolarization: Secondary | ICD-10-CM | POA: Insufficient documentation

## 2015-01-11 DIAGNOSIS — I480 Paroxysmal atrial fibrillation: Secondary | ICD-10-CM

## 2015-01-11 DIAGNOSIS — K649 Unspecified hemorrhoids: Secondary | ICD-10-CM | POA: Diagnosis not present

## 2015-01-11 DIAGNOSIS — Z8673 Personal history of transient ischemic attack (TIA), and cerebral infarction without residual deficits: Secondary | ICD-10-CM | POA: Insufficient documentation

## 2015-01-11 DIAGNOSIS — I4891 Unspecified atrial fibrillation: Secondary | ICD-10-CM | POA: Diagnosis present

## 2015-01-11 DIAGNOSIS — R079 Chest pain, unspecified: Secondary | ICD-10-CM | POA: Insufficient documentation

## 2015-01-11 DIAGNOSIS — K644 Residual hemorrhoidal skin tags: Secondary | ICD-10-CM | POA: Diagnosis not present

## 2015-01-11 DIAGNOSIS — R05 Cough: Secondary | ICD-10-CM | POA: Diagnosis not present

## 2015-01-11 DIAGNOSIS — R0789 Other chest pain: Secondary | ICD-10-CM

## 2015-01-11 DIAGNOSIS — K921 Melena: Secondary | ICD-10-CM | POA: Diagnosis not present

## 2015-01-11 DIAGNOSIS — R778 Other specified abnormalities of plasma proteins: Secondary | ICD-10-CM | POA: Diagnosis present

## 2015-01-11 DIAGNOSIS — K219 Gastro-esophageal reflux disease without esophagitis: Secondary | ICD-10-CM | POA: Insufficient documentation

## 2015-01-11 DIAGNOSIS — R1110 Vomiting, unspecified: Secondary | ICD-10-CM | POA: Insufficient documentation

## 2015-01-11 DIAGNOSIS — K625 Hemorrhage of anus and rectum: Secondary | ICD-10-CM | POA: Diagnosis present

## 2015-01-11 HISTORY — DX: Hemorrhage of anus and rectum: K62.5

## 2015-01-11 LAB — COMPREHENSIVE METABOLIC PANEL
ALBUMIN: 3.2 g/dL — AB (ref 3.5–5.0)
ALK PHOS: 64 U/L (ref 38–126)
ALT: 19 U/L (ref 14–54)
ANION GAP: 11 (ref 5–15)
AST: 25 U/L (ref 15–41)
BUN: 7 mg/dL (ref 6–20)
CALCIUM: 8.5 mg/dL — AB (ref 8.9–10.3)
CHLORIDE: 90 mmol/L — AB (ref 101–111)
CO2: 27 mmol/L (ref 22–32)
Creatinine, Ser: 0.68 mg/dL (ref 0.44–1.00)
GFR calc Af Amer: >60 mL/min (ref >60)
GFR calc non Af Amer: >60 mL/min (ref >60)
GLUCOSE: 133 mg/dL — AB (ref 65–99)
Potassium: 4 mmol/L (ref 3.5–5.1)
SODIUM: 128 mmol/L — AB (ref 135–145)
Total Bilirubin: 0.2 mg/dL — ABNORMAL LOW (ref 0.3–1.2)
Total Protein: 6.7 g/dL (ref 6.5–8.1)

## 2015-01-11 LAB — URINALYSIS, ROUTINE W REFLEX MICROSCOPIC
BILIRUBIN URINE: NEGATIVE
Glucose, UA: NEGATIVE mg/dL
KETONES UR: NEGATIVE mg/dL
LEUKOCYTES UA: NEGATIVE
NITRITE: NEGATIVE
PH: 6.5 (ref 5.0–8.0)
PROTEIN: NEGATIVE mg/dL
Specific Gravity, Urine: 1.013 (ref 1.005–1.030)

## 2015-01-11 LAB — TYPE AND SCREEN
ABO/RH(D): A POS
Antibody Screen: NEGATIVE

## 2015-01-11 LAB — URINE MICROSCOPIC-ADD ON

## 2015-01-11 LAB — PROTIME-INR
INR: 3.09 — AB (ref 0.00–1.49)
PROTHROMBIN TIME: 31.3 s — AB (ref 11.6–15.2)

## 2015-01-11 LAB — CBC
HCT: 35.6 % — ABNORMAL LOW (ref 36.0–46.0)
HEMOGLOBIN: 11.9 g/dL — AB (ref 12.0–15.0)
MCH: 30.5 pg (ref 26.0–34.0)
MCHC: 33.4 g/dL (ref 30.0–36.0)
MCV: 91.3 fL (ref 78.0–100.0)
Platelets: 353 10*3/uL (ref 150–400)
RBC: 3.9 MIL/uL (ref 3.87–5.11)
RDW: 13.6 % (ref 11.5–15.5)
WBC: 6.6 10*3/uL (ref 4.0–10.5)

## 2015-01-11 LAB — I-STAT TROPONIN, ED: TROPONIN I, POC: 0.09 ng/mL — AB (ref 0.00–0.08)

## 2015-01-11 LAB — POC OCCULT BLOOD, ED: FECAL OCCULT BLD: POSITIVE — AB

## 2015-01-11 LAB — LIPASE, BLOOD: Lipase: 26 U/L (ref 11–51)

## 2015-01-11 MED ORDER — ALUM & MAG HYDROXIDE-SIMETH 200-200-20 MG/5ML PO SUSP
15.0000 mL | Freq: Once | ORAL | Status: AC
  Administered 2015-01-11: 15 mL via ORAL
  Filled 2015-01-11: qty 30

## 2015-01-11 MED ORDER — FENTANYL CITRATE (PF) 100 MCG/2ML IJ SOLN
25.0000 ug | Freq: Once | INTRAMUSCULAR | Status: AC
  Administered 2015-01-11: 25 ug via INTRAVENOUS
  Filled 2015-01-11: qty 2

## 2015-01-11 MED ORDER — ASPIRIN 81 MG PO CHEW
324.0000 mg | CHEWABLE_TABLET | Freq: Once | ORAL | Status: AC
  Administered 2015-01-11: 324 mg via ORAL
  Filled 2015-01-11: qty 4

## 2015-01-11 MED ORDER — LIDOCAINE VISCOUS 2 % MT SOLN
15.0000 mL | Freq: Once | OROMUCOSAL | Status: AC
  Administered 2015-01-11: 15 mL via OROMUCOSAL
  Filled 2015-01-11: qty 15

## 2015-01-11 MED ORDER — ONDANSETRON HCL 4 MG/2ML IJ SOLN
4.0000 mg | Freq: Once | INTRAMUSCULAR | Status: AC
  Administered 2015-01-11: 4 mg via INTRAVENOUS
  Filled 2015-01-11: qty 2

## 2015-01-11 NOTE — ED Notes (Signed)
Dr. Seymore at the bedside.  

## 2015-01-11 NOTE — ED Notes (Signed)
Reviewed labs with Dr. Jearld Pies, troponin of 0.09, reviewed  Positive hemacult. MD acknowledges, allows aspirin as patient has hemorrhoids. 2nd RN attempting IV access.

## 2015-01-11 NOTE — Consult Note (Signed)
Referring Physician: Dr. Jearld Pies -- ER  Primary Cardiologist: Dr. Ron Parker; Dr. Caryl Comes (EP)  Reason for Consultation: Mildly abnormal Trop 0.09; history of AF and PVC  HPI: 74 [place] woman with paroxysmal AF, PVC, on Flecainide and Atenolol, presents after she had two large vomiting episodes and husband noted :enough" blood in the toilet bowl after her. No angina, dyspnea, syncope, fever, diaphoresis.   She reports years of GI symptoms and prior GI evaluation with EGD and colonoscopy many years ago. She reports nausea after eating certain food items and belching and bloating. She also reports constipation as well as loose BM off an on. She reports history of hemorrhoids which sometimes bleed and sometimes prolapse after straining.     Review of Systems:  10 systems reviewed unremarkable except as noted in my HPI   Past Medical History  Diagnosis Date  . Atrial fibrillation (HCC)     Paroxysmal, Flecainide therapy  . Warfarin anticoagulation   . Drug therapy     Flecainide for atrial fibrillation  . Hypothyroidism   . Carotid bruit     Doppler, December, 2009, no abnormality  . Ejection fraction     EF normal, echo, 2003  //   EF 60-65%, echo, June 18, 123456, diastolic dysfunction,  . Normal nuclear stress test     Normal, 2001  . Diverticulosis   . HLD (hyperlipidemia)   . HTN (hypertension)   . Osteoarthritis   . Preop cardiovascular exam   . Calf pain     September, 2012, at rest  . PONV (postoperative nausea and vomiting)   . GERD (gastroesophageal reflux disease)   . Anemia   . Internal hemorrhoid   . Hypothyroidism   . Cataract   . Stroke ([place])   . Insomnia   . History of shingles      (Not in a hospital admission)   . fentaNYL (SUBLIMAZE) injection  25 mcg Intravenous Once    Infusions:    Allergies  Allergen Reactions  . Penicillins Rash    Social History   Social History  . Marital Status: Married    Spouse Name: N/A  . Number of Children: N/A    . Years of Education: N/A   Occupational History  . Not on file.   Social History Main Topics  . Smoking status: Never Smoker   . Smokeless tobacco: Never Used  . Alcohol Use: No  . Drug Use: No  . Sexual Activity: No   Other Topics Concern  . Not on file   Social History Narrative    Family History  Problem Relation Age of Onset  . COPD Brother   . Atrial fibrillation Brother   . Diabetes Brother   . Liver cancer Brother   . Colon cancer Brother   . Heart attack Father   . Rheum arthritis Mother   . Arthritis Mother   . Heart disease Mother   . Heart attack Daughter   . Coronary artery disease Brother    No current facility-administered medications on file prior to encounter.   Current Outpatient Prescriptions on File Prior to Encounter  Medication Sig Dispense Refill  . acetaminophen (TYLENOL) 500 MG tablet Take 500 mg by mouth every 6 (six) hours as needed for moderate pain.    Marland Kitchen alendronate (FOSAMAX) 70 MG tablet Take 1 tablet (70 mg total) by mouth once a week. Take with a full glass of water on an empty stomach. 4 tablet 1  . atenolol (  TENORMIN) 25 MG tablet Take 25 mg by mouth daily.     . cefdinir (OMNICEF) 300 MG capsule Take 1 capsule (300 mg total) by mouth 2 (two) times daily. 1 po BID 20 capsule 0  . dicyclomine (BENTYL) 10 MG capsule Take 10 mg by mouth daily as needed (upset stomach).    . ergocalciferol (VITAMIN D2) 50000 UNITS capsule Take 50,000 Units by mouth once a week. Pt takes on sunday    . flecainide (TAMBOCOR) 100 MG tablet Take 100 mg by mouth 2 (two) times daily.     . fluticasone (FLONASE) 50 MCG/ACT nasal spray Use 2 sprays in each  nostril once daily    . furosemide (LASIX) 40 MG tablet Take 0.5-1 tablets (20-40 mg total) by mouth every morning. 30 tablet 2  . gabapentin (NEURONTIN) 100 MG capsule Take 1 tab in the morning (Patient taking differently: Take 100 mg by mouth every morning. Take 1 tab in the morning) 90 capsule 1  . GARLIC PO  Take 1 tablet by mouth daily.    Marland Kitchen levothyroxine (SYNTHROID, LEVOTHROID) 50 MCG tablet Take 50 mcg by mouth every morning.     . meclizine (ANTIVERT) 25 MG tablet Take 25 mg by mouth daily as needed. Inner ear    . mupirocin ointment (BACTROBAN) 2 % Place 1 application into the nose 2 (two) times daily. 22 g 0  . omeprazole (PRILOSEC) 20 MG capsule Take 1 po BID x 2 weeks then once a day (Patient taking differently: Take 20 mg by mouth 2 (two) times daily before a meal. Take 1 po BID x 2 weeks then once a day) 60 capsule 0  . potassium chloride SA (K-DUR,KLOR-CON) 20 MEQ tablet Take 20 mEq by mouth daily.     . traMADol (ULTRAM) 50 MG tablet Take 1 tablet (50 mg total) by mouth every 8 (eight) hours as needed. 30 tablet 3  . vitamin B-12 (CYANOCOBALAMIN) 500 MCG tablet Take 500 mcg by mouth daily.     Marland Kitchen warfarin (COUMADIN) 5 MG tablet Take 5-7.5 mg by mouth every morning. 7.5mg  on Wednesday and Saturday and 5 mg all other days.    Marland Kitchen zolpidem (AMBIEN) 10 MG tablet Take 0.5-1 tablets (5-10 mg total) by mouth at bedtime as needed for sleep. Take 1/2 to 1 tablet at bedtime as needed 15 tablet 2     PHYSICAL EXAM: Filed Vitals:   01/11/15 2115 01/11/15 2145  BP: 177/75 186/73  Pulse: 70 72  Temp:    Resp: 16 16    No intake or output data in the 24 hours ending 01/11/15 2305  General:  Elderly, no acute distress HEENT: hard of hearing  Neck: supple. no JVD. Carotids 2+ bilat; no bruits. No lymphadenopathy or thryomegaly appreciated. Cor: PMI nondisplaced. Regular rate & rhythm. PVCs. No rubs, gallops or murmurs. Lungs: clear Abdomen: soft, nontender, nondistended. No hepatosplenomegaly. No bruits or masses. Good bowel sounds. Extremities: no cyanosis, clubbing, rash, edema Neuro: alert & oriented x 3, cranial nerves grossly intact. moves all 4 extremities w/o difficulty. Affect pleasant.  ECG: sinus rhythm, prolong PR, narrow QRS, PVC in trigeminy patter, PVC is monomorphic with superior  axis RBBB pattern and leftward axis and early precordial transition (could be papillary muscle)  Results for orders placed or performed during the hospital encounter of 01/11/15 (from the past 24 hour(s))  Type and screen [place]     Status: None   Collection Time: 01/11/15  8:59 PM  Result Value Ref Range   ABO/RH(D) A POS    Antibody Screen NEG    Sample Expiration 01/14/2015   ABO/Rh     Status: None (Preliminary result)   Collection Time: 01/11/15  8:59 PM  Result Value Ref Range   ABO/RH(D) A POS   Urinalysis, Routine w reflex microscopic (not at [facility])     Status: Abnormal   Collection Time: 01/11/15  9:00 PM  Result Value Ref Range   Color, Urine YELLOW YELLOW   APPearance HAZY (A) CLEAR   Specific Gravity, Urine 1.013 1.005 - 1.030   pH 6.5 5.0 - 8.0   Glucose, UA NEGATIVE NEGATIVE mg/dL   Hgb urine dipstick MODERATE (A) NEGATIVE   Bilirubin Urine NEGATIVE NEGATIVE   Ketones, ur NEGATIVE NEGATIVE mg/dL   Protein, ur NEGATIVE NEGATIVE mg/dL   Nitrite NEGATIVE NEGATIVE   Leukocytes, UA NEGATIVE NEGATIVE  Urine microscopic-add on     Status: Abnormal   Collection Time: 01/11/15  9:00 PM  Result Value Ref Range   Squamous Epithelial / LPF 6-30 (A) NONE SEEN   WBC, UA 0-5 0 - 5 WBC/hpf   RBC / HPF 6-30 0 - 5 RBC/hpf   Bacteria, UA RARE (A) NONE SEEN  Comprehensive metabolic panel     Status: Abnormal   Collection Time: 01/11/15  9:01 PM  Result Value Ref Range   Sodium 128 (L) 135 - 145 mmol/L   Potassium 4.0 3.5 - 5.1 mmol/L   Chloride 90 (L) 101 - 111 mmol/L   CO2 27 22 - 32 mmol/L   Glucose, Bld 133 (H) 65 - 99 mg/dL   BUN 7 6 - 20 mg/dL   Creatinine, Ser 0.68 0.44 - 1.00 mg/dL   Calcium 8.5 (L) 8.9 - 10.3 mg/dL   Total Protein 6.7 6.5 - 8.1 g/dL   Albumin 3.2 (L) 3.5 - 5.0 g/dL   AST 25 15 - 41 U/L   ALT 19 14 - 54 U/L   Alkaline Phosphatase 64 38 - 126 U/L   Total Bilirubin 0.2 (L) 0.3 - 1.2 mg/dL   GFR calc non Af Amer >60 >60 mL/min    GFR calc Af Amer >60 >60 mL/min   Anion gap 11 5 - 15  CBC     Status: Abnormal   Collection Time: 01/11/15  9:01 PM  Result Value Ref Range   WBC 6.6 4.0 - 10.5 K/uL   RBC 3.90 3.87 - 5.11 MIL/uL   Hemoglobin 11.9 (L) 12.0 - 15.0 g/dL   HCT 35.6 (L) 36.0 - 46.0 %   MCV 91.3 78.0 - 100.0 fL   MCH 30.5 26.0 - 34.0 pg   MCHC 33.4 30.0 - 36.0 g/dL   RDW 13.6 11.5 - 15.5 %   Platelets 353 150 - 400 K/uL  Protime-INR - (order if Patient is taking Coumadin / Warfarin)     Status: Abnormal   Collection Time: 01/11/15  9:01 PM  Result Value Ref Range   Prothrombin Time 31.3 (H) 11.6 - 15.2 seconds   INR 3.09 (H) 0.00 - 1.49  Lipase, blood     Status: None   Collection Time: 01/11/15  9:01 PM  Result Value Ref Range   Lipase 26 11 - 51 U/L  I-Stat Troponin, ED - 0, 3, 6 hours (not at [facility])     Status: Abnormal   Collection Time: 01/11/15  9:13 PM  Result Value Ref Range   Troponin i, poc 0.09 (HH) 0.00 -  0.08 ng/mL   Comment NOTIFIED PHYSICIAN    Comment 3          POC occult blood, ED     Status: Abnormal   Collection Time: 01/11/15  9:14 PM  Result Value Ref Range   Fecal Occult Bld POSITIVE (A) NEGATIVE   Dg Chest 2 View  01/11/2015  CLINICAL DATA:  Acute onset of epigastric abdominal pain and vomiting. Generalized weakness. Subacute onset of cough, and acute rectal bleeding. Initial encounter. EXAM: CHEST  2 VIEW COMPARISON:  Chest radiograph and CTA of the chest performed 12/30/2014 FINDINGS: The lungs are well-aerated. Mild vascular congestion is noted, with mild bibasilar atelectasis. There is no evidence of pleural effusion or pneumothorax. The heart is borderline normal in size. No acute osseous abnormalities are seen. IMPRESSION: Mild vascular congestion noted, with mild bibasilar atelectasis. Electronically Signed   By: Garald Balding M.D.   On: 01/11/2015 22:09   US Abdomen Limited Ruq  01/11/2015  CLINICAL DATA:  Right upper quadrant pain for 1 day. Associated nausea and  vomiting. EXAM: US ABDOMEN LIMITED - RIGHT UPPER QUADRANT COMPARISON:  CT of the abdomen pelvis dated 08/04/2011 FINDINGS: Gallbladder: There are 2 mobile gallstones measuring less than 1.5 cm. No gallbladder with wall thickening visualized, gallbladder wall measures 2.7 mm. No sonographic Murphy sign noted by sonographer. Common bile duct: Diameter: 5.3 mm Liver: No focal lesion identified. Within normal limits in parenchymal echogenicity. IMPRESSION: Cholelithiasis without sonographic evidence of acute cholecystitis. Electronically Signed   By: Fidela Salisbury M.D.   On: 01/11/2015 22:46     ASSESSMENT:  1. GI symptoms -- N/V (two large episodes of vomiting), nausea with eating, rectal bleeding with known hemorrhoids, possible IBS with alternating constipation and loose stools - Her GI symptoms are there fore many years and she reports having work up in the past including EGD and colonoscopy   2. History of paroxysmal AF (currently in sinus; on coumadin for stroke prevention with therapeutic INR), PVC (burden was 3.3% in 2014, on Flecainide for a long time, has had normal LVEF in the past, no known CAD/MI - CHA2DS2-VASc score = 4 (age over 37, gender, HTN) - PVC is monomorphic with superior axis RBBB pattern and leftward axis and early precordial transition (could be papillary muscle)   3. Trop 0.09 likely not due to ACS; no angina or dyspnea - ECG not suggestive of acute ischemia  4. Hyponatremia - etiology uncertain at this time    PLAN/DISCUSSION:  1. From cardiac standpoint, would continue Flecainide as likely helping to maintain sinus rhythm  2. Check echo to assess LVEF 3. If ongoing bleeding, then may hold coumadin in the short term; weigh risk of bleeding vs benefit of long term stroke prevention before resumption 4. May need Holter to reassess PVC burden but it can be done as an outpatient   Would defer management of her noncardiac issues to the medicine service.   Thanks  for the consult  Cardiology will follow    Wandra Mannan, MD Cardiology

## 2015-01-11 NOTE — ED Notes (Signed)
Per EMS, patient comes for rectal bleeding started 1 hour ago, hx of hemorrhoids. Currently on coumadin. Patient reports mild weakness. Hypertensive prior to arrival with systolic in [id number]. She took her own bp meds prior to arrival.

## 2015-01-11 NOTE — ED Notes (Signed)
Attempted to call report x 1  

## 2015-01-11 NOTE — H&P (Signed)
History and Physical  Patient Name: [name]     [id number]    DOB: 04-Aug-1932    DOA: 01/11/2015 Referring physician: Esaw Grandchild, MD PCP: Kenn File, MD      Chief Complaint: Epigastric pain  HPI: [name] is a 82 y.o. female with a past medical history significant for pAF on flecainide and warfarin, HTN, hypothyroidism, and GERD who presents with worsening epigastric pain for two weeks.  He has had on and off epigastric pain, reflux, and IBS for some time for which she takes omeprazole and dicyclomine occasionally.  2 weeks ago on Christmas, the patient ate too much, and developed that night severe epigastric pain, burning in nature, constant, and associated with bloating and eructation.  She went to the ER at [facility], where she had 2 hour troponin rule out, negative CT angiogram of the chest (without PE or dissection), and was discharged with plans for follow up RUQ [place] (presuming pain was from gallbladder disease).    Since then, her pain has been present every day, worse with foods (especially spicy foods), worse with lying down.  She is sedentary now, and so it is not clear if the pain is exertional, but it seems to be provoked more by eating, she states and family confirm.  It is relieved by TUMS, GI cocktail, and dicyclomine.  Tonight, the pain was worse again, and her husband noticed blood in the toilet bowl, so he brought her back to the ER.  In the ED, the patient was afebrile and hypertensive.  She had stable Hgb and bleeding prolapsed hemorrhoids, per EDP report.  She had new hyponatremia and minimally elevated troponin.  The lipase was normal. The INR was slightly supratherapeutic.  Right upper quadrant ultrasound showed gallbladder stones without inflammation. TRH was asked to evaluate for admission for elevated troponin and epigastric pain.  Of note, this is superimposed upon 6 weeks of new weakness and exercise intolerance. She used to be able to go up and  down stairs in her home easily now no longer can do that. She has to hold onto the wall to walk from room to room and she has fallen several times. She used to be able to work on an exercise machine and her home and she cannot no longer do this either. She has chronic orthopnea, not clearly worse. She has leg swelling that is also chronic and may be worse. She thinks she does sit up at night and feel like her breathing is better.     Review of Systems:  Pt complains of epigastric pain, belching, bloating, vomiting once nonbloody nonbilious. Pt denies any fever, hematemesis, dysphagia to solids, progressive vomiting, dysphagia to liquids, weight loss, chest discomfort, palpitations.  All other systems negative except as just noted or noted in the history of present illness.  Allergies  Allergen Reactions  . Penicillins Rash    Prior to Admission medications   Medication Sig Start Date End Date Taking? Authorizing Provider  acetaminophen (TYLENOL) 500 MG tablet Take 500 mg by mouth every 6 (six) hours as needed for moderate pain.   Yes Historical Provider, MD  alendronate (FOSAMAX) 70 MG tablet Take 1 tablet (70 mg total) by mouth once a week. Take with a full glass of water on an empty stomach. 12/16/14  Yes Tammy Eckard, PHARMD  atenolol (TENORMIN) 25 MG tablet Take 25 mg by mouth daily.    Yes Historical Provider, MD  cefdinir (OMNICEF) 300 MG capsule Take  1 capsule (300 mg total) by mouth 2 (two) times daily. 1 po BID 01/07/15  Yes Timmothy Euler, MD  dicyclomine (BENTYL) 10 MG capsule Take 10 mg by mouth daily as needed (upset stomach).   Yes Historical Provider, MD  ergocalciferol (VITAMIN D2) 50000 UNITS capsule Take 50,000 Units by mouth once a week. Pt takes on sunday   Yes Historical Provider, MD  flecainide (TAMBOCOR) 100 MG tablet Take 100 mg by mouth 2 (two) times daily.    Yes Historical Provider, MD  fluticasone (FLONASE) 50 MCG/ACT nasal spray Use 2 sprays in each  nostril once  daily 11/01/14  Yes Historical Provider, MD  furosemide (LASIX) 40 MG tablet Take 0.5-1 tablets (20-40 mg total) by mouth every morning. 11/25/14  Yes Timmothy Euler, MD  gabapentin (NEURONTIN) 100 MG capsule Take 1 tab in the morning Patient taking differently: Take 100 mg by mouth every morning. Take 1 tab in the morning 12/17/14  Yes Sharion Balloon, FNP  GARLIC PO Take 1 tablet by mouth daily.   Yes Historical Provider, MD  levothyroxine (SYNTHROID, LEVOTHROID) 50 MCG tablet Take 50 mcg by mouth every morning.    Yes Historical Provider, MD  meclizine (ANTIVERT) 25 MG tablet Take 25 mg by mouth daily as needed. Inner ear   Yes Historical Provider, MD  mupirocin ointment (BACTROBAN) 2 % Place 1 application into the nose 2 (two) times daily. 11/25/14  Yes Timmothy Euler, MD  omeprazole (PRILOSEC) 20 MG capsule Take 1 po BID x 2 weeks then once a day Patient taking differently: Take 20 mg by mouth 2 (two) times daily before a meal. Take 1 po BID x 2 weeks then once a day 12/30/14  Yes Rolland Porter, MD  potassium chloride SA (K-DUR,KLOR-CON) 20 MEQ tablet Take 20 mEq by mouth daily.  10/19/12  Yes Historical Provider, MD  traMADol (ULTRAM) 50 MG tablet Take 1 tablet (50 mg total) by mouth every 8 (eight) hours as needed. 12/17/14  Yes Sharion Balloon, FNP  vitamin B-12 (CYANOCOBALAMIN) 500 MCG tablet Take 500 mcg by mouth daily.    Yes Historical Provider, MD  warfarin (COUMADIN) 5 MG tablet Take 5-7.5 mg by mouth every morning. 7.5mg  on Wednesday and Saturday and 5 mg all other days.   Yes Historical Provider, MD  zolpidem (AMBIEN) 10 MG tablet Take 0.5-1 tablets (5-10 mg total) by mouth at bedtime as needed for sleep. Take 1/2 to 1 tablet at bedtime as needed 12/17/14  Yes Sharion Balloon, FNP    Past Medical History  Diagnosis Date  . Atrial fibrillation (HCC)     Paroxysmal, Flecainide therapy  . Warfarin anticoagulation   . Drug therapy     Flecainide for atrial fibrillation  .  Hypothyroidism   . Carotid bruit     Doppler, December, 2009, no abnormality  . Ejection fraction     EF normal, echo, 2003  //   EF 60-65%, echo, June 18, 123456, diastolic dysfunction,  . Normal nuclear stress test     Normal, 2001  . Diverticulosis   . HLD (hyperlipidemia)   . HTN (hypertension)   . Osteoarthritis   . Preop cardiovascular exam   . Calf pain     September, 2012, at rest  . PONV (postoperative nausea and vomiting)   . GERD (gastroesophageal reflux disease)   . Anemia   . Internal hemorrhoid   . Hypothyroidism   . Cataract   . Stroke ([place])   .  Insomnia   . History of shingles     Past Surgical History  Procedure Laterality Date  . Total knee arthroplasty  05/26/10    right  . Total shoulder replacement  01/2010    left  . Shoulder open rotator cuff repair      bilateral  . Total knee arthroplasty  11/17/2010    Procedure: TOTAL KNEE ARTHROPLASTY;  Surgeon: Mauri Pole;  Location: [facility];  Service: Orthopedics;  Laterality: Left;  . Total knee arthroplasty      Right  . Cataract extraction w/phaco Right 04/15/2014    Procedure: CATARACT EXTRACTION PHACO AND INTRAOCULAR LENS PLACEMENT (IOC);  Surgeon: Tonny Branch, MD;  Location: [facility];  Service: Ophthalmology;  Laterality: Right;  CDE: 13.30  . Cataract extraction w/phaco Left 05/13/2014    Procedure: CATARACT EXTRACTION PHACO AND INTRAOCULAR LENS PLACEMENT LEFT EYE;  Surgeon: Tonny Branch, MD;  Location: [facility];  Service: Ophthalmology;  Laterality: Left;  CDE:13.00    Family history: family history includes Arthritis in her mother; Atrial fibrillation in her brother; COPD in her brother; Colon cancer in her brother; Coronary artery disease in her brother; Diabetes in her brother; Gallbladder disease in her brother; Heart attack in her daughter and father; Heart disease in her mother; Liver cancer in her brother; Rheum arthritis in her mother.  Social History: Patient lives with her husband. She does not smoke.   She does not use a cane or walker.  She used to work from home.       Physical Exam: BP 181/77 mmHg  Pulse 75  Temp(Src) 98.3 F (36.8 C) (Oral)  Resp 16  Ht 5\' 3"  (1.6 m)  Wt 65.772 kg (145 lb)  BMI 25.69 kg/m2  SpO2 94% General appearance: Elderly adult female, alert and tired appearing.   Eyes: Anicteric, conjunctiva pink, lids and lashes normal.     ENT: No nasal deformity, discharge, or epistaxis.  OP moist without lesions.   Lymph: No cervical lymphadenopathy. Skin: Warm and dry.  No jaundice.  No suspicious rashes or lesions on face, neck, upper chest, belly, back, arms or legs. Cardiac: Irregularly irregular, normal rate, nl S1-S2, no murmurs appreciated.  Capillary refill is brisk.  JVP elevated to mid-neck.  Non-pitting LE edema.  Radial and DP pulses 2+ and symmetric.  No carotid bruits. Respiratory: Normal respiratory rate and rhythm.  CTAB without rales or wheezes. Abdomen: Abdomen soft without rigidity.  Diffusely uncomfortable with palpation, but no focal TTP or guarding. No definite ascites.  No HSM.  Negative Murphy's sign. MSK: No deformities or effusions. Neuro: Sensorium intact and responding to questions, attention normal.  Speech is fluent.  Moves all extremities equally and with normal coordination.    Psych: Behavior appropriate.  Affect normal.  No evidence of aural or visual hallucinations or delusions.       Labs on Admission:  The metabolic panel shows hyponatremia, normal renal function, normal sugar. The transaminases and bilirubin are normal. Albumin is slightly low. The urinalysis is unremarkable. The lipase is 26 and normal. The INR is just above 3. The troponin is 0.09 ng per mL. Fecal occult blood testing is positive. The complete blood count shows mild normocytic anemia, similar to baseline, no leukocytosis or thrombocytopenia.   Radiological Exams on Admission: Personally reviewed: Dg Chest 2 View 01/11/2015 Mild vascular congestion,  consistent with minimal edema.   US Abdomen Limited Ruq 01/11/2015  "FINDINGS: Gallbladder: There are 2 mobile gallstones measuring less than 1.5  cm. No gallbladder with wall thickening visualized, gallbladder wall measures 2.7 mm. No sonographic Murphy sign noted by sonographer. Common bile duct: Diameter: 5.3 mm Liver: No focal lesion identified. Within normal limits in parenchymal echogenicity."    EKG: Independently reviewed. Rate 72, sinus. First-degree AV block. QTC 450. Frequent PVCs are old (but new since last summer) and old anterior T-wave inversions in V1 and V2.  Echocardiogram 2014: EF 60-65% no valvular disease.  Carotid US 2015: No appreciable disease.     Assessment/Plan 1. Epigastric discomfort:  This seems to be her chief complaint. This is new.  This is chronic for the patient, burning epigastric pain, worse with spicy foods or laying down and associated with belching, but just very severe now.  Suspect severe GERD.   The differential includes hiatal hernia, malignancy, but also gallbladder pain, anginal equivalent.  No red flag findings of progressive emesis, solids dysphagia, or weight loss.  Despite hypertension, dissection was ruled out with CTA at onset of pain 2 weeks ago. -I will order a barium esophagram to see if we can non-invasively observe reflux or hiatal hernia.  -Maalox PRN -TUMS PRN -Increase PPI to twice daily, 30 minutes before meals   2. Weakness, progressive:  This is new.  The patient reports progressive exercise intolerance over the last 1 month.  She used to be able to handle stairs and an exercise machine, now she can't.  She also has orthopnea, ?PND, and mild fluid overload on exam/studies.  Also question increased PVC burden causing exercise intolerance. -Echocardiogram is ordered -Will check BNP -Cardiology following, appreciate cares  3. Elevated troponin:  Suspect demand.  Her symptoms are classic for GERD (see above), although risk  factors include age, family hx premature CAD, and HLD.  CHF within the differential.  Patient had 2hr troponin rule out 2 weeks ago, but resolving MI is also possible. -Trend TNI -Consult to Cardiology for risk stratification  4. Hyponatremia:  This is new since two weeks ago.  She appears hypervolemic to me, mildly. -Check free water clearance.  5. Positive FOBT:  These are hemorrhoids.  Colonic bleeding is doubted. -Patient should be encouraged to follow up with General Surgery as outpatient regarding severe hemorrhoids  6. pAF:  Stable. CHADS2Vasc 4.  Currently sinus with frequent PVCs. -Continue warfarin and atenolol and flecainide.  7. Hypothyroidism:  -Continue levothyroxine.  8. Hypertension:  Hypertensive at admission. Renal function normal. -Continue atenolol and furosemide -Add lisinopril 10 mg daily    DVT PPx: Warfarin Diet: Cardiac Consultants: Cardiology Code Status: Full Family Communication: Husband, son and daughter, present at bedside.  Medical decision making: What exists of the patient's previous chart was reviewed in depth and the case was discussed with Dr. Susy Manor and Dr. Jearld Pies. Patient seen 11:35 PM on 01/11/2015.  Disposition Plan:  Admit to tele for serial troponins.  Barium esophagram and echocardiogram to evaluate epigastric pain and progressive exercise intolerance respectively.      Edwin Dada [facility] Pager [phone]

## 2015-01-11 NOTE — ED Notes (Signed)
Dr. Jearld Pies aware of bp.

## 2015-01-11 NOTE — ED Provider Notes (Signed)
CSN: [id number]     Arrival date & time 01/11/15  2037 History   First MD Initiated Contact with Patient 01/11/15 2045     Chief Complaint  Patient presents with  . Rectal Bleeding  . Weakness     (Consider location/radiation/quality/duration/timing/severity/associated sxs/prior Treatment) Patient is a 82 y.o. female presenting with hematochezia and weakness. The history is provided by the patient and the spouse.  Rectal Bleeding Quality:  Bright red Amount:  Scant Timing:  Intermittent Progression:  Waxing and waning Chronicity:  Chronic Context: hemorrhoids   Context: not anal fissures, not anal penetration, not constipation and not rectal pain   Similar prior episodes: yes   Relieved by:  None tried Worsened by:  Nothing tried Ineffective treatments:  None tried Associated symptoms: abdominal pain and vomiting   Associated symptoms: no dizziness, no epistaxis, no fever, no hematemesis, no light-headedness, no loss of consciousness and no recent illness   Risk factors: anticoagulant use   Weakness Associated symptoms include abdominal pain, fatigue (Pt reports days of worsening fatigue), nausea, vomiting and weakness. Pertinent negatives include no chest pain, coughing, fever, myalgias or rash.    Past Medical History  Diagnosis Date  . Atrial fibrillation (HCC)     Paroxysmal, Flecainide therapy  . Warfarin anticoagulation   . Drug therapy     Flecainide for atrial fibrillation  . Hypothyroidism   . Carotid bruit     Doppler, December, 2009, no abnormality  . Ejection fraction     EF normal, echo, 2003  //   EF 60-65%, echo, June 18, 123456, diastolic dysfunction,  . Normal nuclear stress test     Normal, 2001  . Diverticulosis   . HLD (hyperlipidemia)   . HTN (hypertension)   . Osteoarthritis   . Preop cardiovascular exam   . Calf pain     September, 2012, at rest  . PONV (postoperative nausea and vomiting)   . GERD (gastroesophageal reflux disease)   . Anemia    . Internal hemorrhoid   . Hypothyroidism   . Cataract   . Stroke ([place])   . Insomnia   . History of shingles    Past Surgical History  Procedure Laterality Date  . Total knee arthroplasty  05/26/10    right  . Total shoulder replacement  01/2010    left  . Shoulder open rotator cuff repair      bilateral  . Total knee arthroplasty  11/17/2010    Procedure: TOTAL KNEE ARTHROPLASTY;  Surgeon: Mauri Pole;  Location: [facility];  Service: Orthopedics;  Laterality: Left;  . Total knee arthroplasty      Right  . Cataract extraction w/phaco Right 04/15/2014    Procedure: CATARACT EXTRACTION PHACO AND INTRAOCULAR LENS PLACEMENT (IOC);  Surgeon: Tonny Branch, MD;  Location: [facility];  Service: Ophthalmology;  Laterality: Right;  CDE: 13.30  . Cataract extraction w/phaco Left 05/13/2014    Procedure: CATARACT EXTRACTION PHACO AND INTRAOCULAR LENS PLACEMENT LEFT EYE;  Surgeon: Tonny Branch, MD;  Location: [facility];  Service: Ophthalmology;  Laterality: Left;  CDE:13.00   Family History  Problem Relation Age of Onset  . COPD Brother   . Atrial fibrillation Brother   . Diabetes Brother   . Liver cancer Brother   . Colon cancer Brother   . Heart attack Father   . Rheum arthritis Mother   . Arthritis Mother   . Heart disease Mother   . Heart attack Daughter   . Coronary artery disease  Brother    Social History  Substance Use Topics  . Smoking status: Never Smoker   . Smokeless tobacco: Never Used  . Alcohol Use: No   OB History    No data available     Review of Systems  Constitutional: Positive for fatigue (Pt reports days of worsening fatigue). Negative for fever and appetite change.  HENT: Negative for drooling and nosebleeds.   Eyes: Negative for pain.  Respiratory: Negative for cough, chest tightness and shortness of breath.   Cardiovascular: Negative for chest pain.  Gastrointestinal: Positive for nausea, vomiting, abdominal pain and hematochezia. Negative for diarrhea, constipation  and hematemesis.  Genitourinary: Negative for dysuria and flank pain.  Musculoskeletal: Negative for myalgias and back pain.  Skin: Negative for rash and wound.  Neurological: Positive for weakness. Negative for dizziness, loss of consciousness and light-headedness.  Psychiatric/Behavioral: Negative for confusion.      Allergies  Penicillins  Home Medications   Prior to Admission medications   Medication Sig Start Date End Date Taking? Authorizing Provider  acetaminophen (TYLENOL) 500 MG tablet Take 500 mg by mouth every 6 (six) hours as needed for moderate pain.   Yes Historical Provider, MD  alendronate (FOSAMAX) 70 MG tablet Take 1 tablet (70 mg total) by mouth once a week. Take with a full glass of water on an empty stomach. 12/16/14  Yes Tammy Eckard, PHARMD  atenolol (TENORMIN) 25 MG tablet Take 25 mg by mouth daily.    Yes Historical Provider, MD  cefdinir (OMNICEF) 300 MG capsule Take 1 capsule (300 mg total) by mouth 2 (two) times daily. 1 po BID 01/07/15  Yes Timmothy Euler, MD  dicyclomine (BENTYL) 10 MG capsule Take 10 mg by mouth daily as needed (upset stomach).   Yes Historical Provider, MD  ergocalciferol (VITAMIN D2) 50000 UNITS capsule Take 50,000 Units by mouth once a week. Pt takes on sunday   Yes Historical Provider, MD  flecainide (TAMBOCOR) 100 MG tablet Take 100 mg by mouth 2 (two) times daily.    Yes Historical Provider, MD  fluticasone (FLONASE) 50 MCG/ACT nasal spray Use 2 sprays in each  nostril once daily 11/01/14  Yes Historical Provider, MD  furosemide (LASIX) 40 MG tablet Take 0.5-1 tablets (20-40 mg total) by mouth every morning. 11/25/14  Yes Timmothy Euler, MD  gabapentin (NEURONTIN) 100 MG capsule Take 1 tab in the morning Patient taking differently: Take 100 mg by mouth every morning. Take 1 tab in the morning 12/17/14  Yes Sharion Balloon, FNP  GARLIC PO Take 1 tablet by mouth daily.   Yes Historical Provider, MD  levothyroxine (SYNTHROID,  LEVOTHROID) 50 MCG tablet Take 50 mcg by mouth every morning.    Yes Historical Provider, MD  meclizine (ANTIVERT) 25 MG tablet Take 25 mg by mouth daily as needed. Inner ear   Yes Historical Provider, MD  mupirocin ointment (BACTROBAN) 2 % Place 1 application into the nose 2 (two) times daily. 11/25/14  Yes Timmothy Euler, MD  omeprazole (PRILOSEC) 20 MG capsule Take 1 po BID x 2 weeks then once a day Patient taking differently: Take 20 mg by mouth 2 (two) times daily before a meal. Take 1 po BID x 2 weeks then once a day 12/30/14  Yes Rolland Porter, MD  potassium chloride SA (K-DUR,KLOR-CON) 20 MEQ tablet Take 20 mEq by mouth daily.  10/19/12  Yes Historical Provider, MD  traMADol (ULTRAM) 50 MG tablet Take 1 tablet (50 mg total) by  mouth every 8 (eight) hours as needed. 12/17/14  Yes Sharion Balloon, FNP  vitamin B-12 (CYANOCOBALAMIN) 500 MCG tablet Take 500 mcg by mouth daily.    Yes Historical Provider, MD  warfarin (COUMADIN) 5 MG tablet Take 5-7.5 mg by mouth every morning. 7.5mg  on Wednesday and Saturday and 5 mg all other days.   Yes Historical Provider, MD  zolpidem (AMBIEN) 10 MG tablet Take 0.5-1 tablets (5-10 mg total) by mouth at bedtime as needed for sleep. Take 1/2 to 1 tablet at bedtime as needed 12/17/14  Yes Christy A Hawks, FNP   BP 186/73 mmHg  Pulse 72  Temp(Src) 98.3 F (36.8 C) (Oral)  Resp 16  Ht 5\' 3"  (1.6 m)  Wt 65.772 kg  BMI 25.69 kg/m2  SpO2 96% Physical Exam  Constitutional: She appears well-developed and well-nourished. She appears ill.  HENT:  Head: Head is without abrasion, without contusion and without laceration.  Mouth/Throat: Mucous membranes are dry.  Eyes: Conjunctivae and EOM are normal. Pupils are equal, round, and reactive to light.  Cardiovascular: Normal rate.  An irregularly irregular rhythm present. Exam reveals no decreased pulses.   No murmur heard. Pulmonary/Chest: Effort normal and breath sounds normal.  Abdominal: Soft. Normal  appearance. She exhibits no distension, no ascites and no mass. There is tenderness in the epigastric area. There is no rigidity, no rebound, no guarding and no CVA tenderness.  Genitourinary: Rectal exam shows external hemorrhoid. Rectal exam shows no internal hemorrhoid, no fissure, no mass, no tenderness and anal tone normal. Guaiac positive stool.    ED Course  Procedures (including critical care time) Labs Review Labs Reviewed  COMPREHENSIVE METABOLIC PANEL - Abnormal; Notable for the following:    Sodium 128 (*)    Chloride 90 (*)    Glucose, Bld 133 (*)    Calcium 8.5 (*)    Albumin 3.2 (*)    Total Bilirubin 0.2 (*)    All other components within normal limits  CBC - Abnormal; Notable for the following:    Hemoglobin 11.9 (*)    HCT 35.6 (*)    All other components within normal limits  PROTIME-INR - Abnormal; Notable for the following:    Prothrombin Time 31.3 (*)    INR 3.09 (*)    All other components within normal limits  URINALYSIS, ROUTINE W REFLEX MICROSCOPIC (NOT AT [facility]) - Abnormal; Notable for the following:    APPearance HAZY (*)    Hgb urine dipstick MODERATE (*)    All other components within normal limits  URINE MICROSCOPIC-ADD ON - Abnormal; Notable for the following:    Squamous Epithelial / LPF 6-30 (*)    Bacteria, UA RARE (*)    All other components within normal limits  POC OCCULT BLOOD, ED - Abnormal; Notable for the following:    Fecal Occult Bld POSITIVE (*)    All other components within normal limits  I-STAT TROPOININ, ED - Abnormal; Notable for the following:    Troponin i, poc 0.09 (*)    All other components within normal limits  LIPASE, BLOOD  TYPE AND SCREEN  ABO/RH    Imaging Review Dg Chest 2 View  01/11/2015  CLINICAL DATA:  Acute onset of epigastric abdominal pain and vomiting. Generalized weakness. Subacute onset of cough, and acute rectal bleeding. Initial encounter. EXAM: CHEST  2 VIEW COMPARISON:  Chest radiograph and CTA of  the chest performed 12/30/2014 FINDINGS: The lungs are well-aerated. Mild vascular congestion is noted, with mild  bibasilar atelectasis. There is no evidence of pleural effusion or pneumothorax. The heart is borderline normal in size. No acute osseous abnormalities are seen. IMPRESSION: Mild vascular congestion noted, with mild bibasilar atelectasis. Electronically Signed   By: Garald Balding M.D.   On: 01/11/2015 22:09   I have personally reviewed and evaluated these images and lab results as part of my medical decision-making.   EKG Interpretation   Date/Time:  Saturday January 11 2015 20:55:45 EST Ventricular Rate:  72 PR Interval:  246 QRS Duration: 111 QT Interval:  411 QTC Calculation: 450 R Axis:   -8 Text Interpretation:  Sinus or ectopic atrial rhythm Ventricular trigeminy  Prolonged PR interval Anterior infarct, age indeterminate Confirmed by  Hazle Coca [phone]) on 01/11/2015 9:11:58 PM      MDM   Final diagnoses:  Epigastric abdominal pain  Non-intractable vomiting with nausea, vomiting of unspecified type  Elevated troponin  External hemorrhoid, bleeding    82 year old Caucasian female past medical history of atrial fibrillation on Coumadin, hemorrhoids presents in the setting of epigastric pain, nausea, vomiting, blood per rectum. Patient reports blood in the rectum is a long-standing issue from external hemorrhoids. She reports it waxes and wanes.  Patient reports in the last several weeks she's had worsening fatigue in the last several days nausea and vomiting. Due to continued fatigue she presented to emergency department. Patient denies any headaches, chest pain, shortness of breath, fevers, rashes.  On arrival patient was hypertensive and appeared ill. Patient had tenderness to abdomen and epigastric region. Patient had external hemorrhoids with signs of bleeding. Stool was not dark and tarry. Guaiac was positive but had a half concern was contaminated with external  hemorrhoids. EKG obtained which shows new T-wave inversions in anterior leads. Patient had troponin obtained which was elevated. Patient also found to be hyponatremic. No significant decrease in hemoglobin. Chest x-ray noted for vascular congestion. At this time I have concern for NSTEMI. Patient given 324 of aspirin.  Cardiology was consult and at this time they are not is concerned this is a cardiac etiology. Right upper quadrant ultrasound was ordered as patient has history of possible gallbladder etiology. Cardiology recommended medicine admission.  I discussed case with Dr. Loleta Books of medicine team. Patient will be admitted to medicine for further management of fatigue, elevated troponin, EKG changes, hyponatremia. Patient stable at time of admission.  Attending has seen and evaluated patient and Dr. Ralene Bathe is in agreement with plan.    Esaw Grandchild, MD 01/12/15 0007  Quintella Reichert, MD 01/12/15 [phone]

## 2015-01-12 ENCOUNTER — Other Ambulatory Visit: Payer: Self-pay

## 2015-01-12 ENCOUNTER — Encounter (HOSPITAL_COMMUNITY): Payer: Self-pay | Admitting: *Deleted

## 2015-01-12 DIAGNOSIS — R0601 Orthopnea: Secondary | ICD-10-CM | POA: Diagnosis not present

## 2015-01-12 DIAGNOSIS — K8020 Calculus of gallbladder without cholecystitis without obstruction: Secondary | ICD-10-CM

## 2015-01-12 DIAGNOSIS — R1013 Epigastric pain: Secondary | ICD-10-CM | POA: Diagnosis not present

## 2015-01-12 DIAGNOSIS — I493 Ventricular premature depolarization: Secondary | ICD-10-CM | POA: Diagnosis not present

## 2015-01-12 DIAGNOSIS — I480 Paroxysmal atrial fibrillation: Secondary | ICD-10-CM | POA: Diagnosis not present

## 2015-01-12 DIAGNOSIS — D649 Anemia, unspecified: Secondary | ICD-10-CM | POA: Diagnosis not present

## 2015-01-12 DIAGNOSIS — Z79899 Other long term (current) drug therapy: Secondary | ICD-10-CM | POA: Diagnosis not present

## 2015-01-12 DIAGNOSIS — K644 Residual hemorrhoidal skin tags: Secondary | ICD-10-CM | POA: Insufficient documentation

## 2015-01-12 DIAGNOSIS — Z880 Allergy status to penicillin: Secondary | ICD-10-CM | POA: Diagnosis not present

## 2015-01-12 DIAGNOSIS — Z8673 Personal history of transient ischemic attack (TIA), and cerebral infarction without residual deficits: Secondary | ICD-10-CM | POA: Diagnosis not present

## 2015-01-12 DIAGNOSIS — E871 Hypo-osmolality and hyponatremia: Secondary | ICD-10-CM | POA: Diagnosis not present

## 2015-01-12 DIAGNOSIS — D689 Coagulation defect, unspecified: Secondary | ICD-10-CM

## 2015-01-12 DIAGNOSIS — R531 Weakness: Secondary | ICD-10-CM | POA: Diagnosis not present

## 2015-01-12 DIAGNOSIS — K219 Gastro-esophageal reflux disease without esophagitis: Secondary | ICD-10-CM | POA: Diagnosis not present

## 2015-01-12 DIAGNOSIS — K8010 Calculus of gallbladder with chronic cholecystitis without obstruction: Secondary | ICD-10-CM | POA: Diagnosis not present

## 2015-01-12 DIAGNOSIS — I10 Essential (primary) hypertension: Secondary | ICD-10-CM | POA: Diagnosis not present

## 2015-01-12 DIAGNOSIS — K449 Diaphragmatic hernia without obstruction or gangrene: Secondary | ICD-10-CM | POA: Diagnosis not present

## 2015-01-12 DIAGNOSIS — E785 Hyperlipidemia, unspecified: Secondary | ICD-10-CM | POA: Diagnosis not present

## 2015-01-12 DIAGNOSIS — R7989 Other specified abnormal findings of blood chemistry: Secondary | ICD-10-CM | POA: Diagnosis not present

## 2015-01-12 DIAGNOSIS — E039 Hypothyroidism, unspecified: Secondary | ICD-10-CM | POA: Diagnosis not present

## 2015-01-12 DIAGNOSIS — Z7901 Long term (current) use of anticoagulants: Secondary | ICD-10-CM | POA: Diagnosis not present

## 2015-01-12 DIAGNOSIS — K649 Unspecified hemorrhoids: Secondary | ICD-10-CM | POA: Diagnosis not present

## 2015-01-12 LAB — COMPREHENSIVE METABOLIC PANEL
ALBUMIN: 2.9 g/dL — AB (ref 3.5–5.0)
ALT: 16 U/L (ref 14–54)
AST: 24 U/L (ref 15–41)
Alkaline Phosphatase: 59 U/L (ref 38–126)
Anion gap: 10 (ref 5–15)
BILIRUBIN TOTAL: 0.4 mg/dL (ref 0.3–1.2)
CHLORIDE: 91 mmol/L — AB (ref 101–111)
CO2: 26 mmol/L (ref 22–32)
Calcium: 8.3 mg/dL — ABNORMAL LOW (ref 8.9–10.3)
Creatinine, Ser: 0.56 mg/dL (ref 0.44–1.00)
GFR calc Af Amer: >60 mL/min (ref >60)
GFR calc non Af Amer: >60 mL/min (ref >60)
GLUCOSE: 111 mg/dL — AB (ref 65–99)
POTASSIUM: 4 mmol/L (ref 3.5–5.1)
SODIUM: 127 mmol/L — AB (ref 135–145)
TOTAL PROTEIN: 6.3 g/dL — AB (ref 6.5–8.1)

## 2015-01-12 LAB — CBC
HEMATOCRIT: 34 % — AB (ref 36.0–46.0)
HEMOGLOBIN: 11.3 g/dL — AB (ref 12.0–15.0)
MCH: 30.3 pg (ref 26.0–34.0)
MCHC: 33.2 g/dL (ref 30.0–36.0)
MCV: 91.2 fL (ref 78.0–100.0)
PLATELETS: 321 10*3/uL (ref 150–400)
RBC: 3.73 MIL/uL — ABNORMAL LOW (ref 3.87–5.11)
RDW: 13.4 % (ref 11.5–15.5)
WBC: 5.8 10*3/uL (ref 4.0–10.5)

## 2015-01-12 LAB — TROPONIN I
TROPONIN I: 0.07 ng/mL — AB (ref <0.031)
Troponin I: 0.12 ng/mL — ABNORMAL HIGH (ref <0.031)
Troponin I: 0.05 ng/mL — ABNORMAL HIGH (ref <0.031)

## 2015-01-12 LAB — POCT I-STAT TROPONIN I: TROPONIN I, POC: 0.15 ng/mL — AB (ref 0.00–0.08)

## 2015-01-12 LAB — PROTIME-INR
INR: 2.58 — AB (ref 0.00–1.49)
PROTHROMBIN TIME: 27.3 s — AB (ref 11.6–15.2)

## 2015-01-12 LAB — ABO/RH: ABO/RH(D): A POS

## 2015-01-12 LAB — OSMOLALITY, URINE: OSMOLALITY UR: 323 mosm/kg (ref 300–900)

## 2015-01-12 LAB — BRAIN NATRIURETIC PEPTIDE: B NATRIURETIC PEPTIDE 5: 273.1 pg/mL — AB (ref 0.0–100.0)

## 2015-01-12 LAB — OSMOLALITY: OSMOLALITY: 263 mosm/kg — AB (ref 275–295)

## 2015-01-12 MED ORDER — FUROSEMIDE 20 MG PO TABS
20.0000 mg | ORAL_TABLET | Freq: Every day | ORAL | Status: DC
  Administered 2015-01-12 – 2015-01-16 (×5): 20 mg via ORAL
  Filled 2015-01-12 (×5): qty 1

## 2015-01-12 MED ORDER — HYDROCORTISONE ACETATE 25 MG RE SUPP
25.0000 mg | Freq: Two times a day (BID) | RECTAL | Status: AC
  Administered 2015-01-12 – 2015-01-13 (×4): 25 mg via RECTAL
  Filled 2015-01-12 (×4): qty 1

## 2015-01-12 MED ORDER — WARFARIN SODIUM 5 MG PO TABS
5.0000 mg | ORAL_TABLET | Freq: Once | ORAL | Status: AC
  Administered 2015-01-12: 5 mg via ORAL
  Filled 2015-01-12: qty 1

## 2015-01-12 MED ORDER — ACETAMINOPHEN 325 MG PO TABS
650.0000 mg | ORAL_TABLET | Freq: Four times a day (QID) | ORAL | Status: DC | PRN
  Administered 2015-01-12: 650 mg via ORAL
  Filled 2015-01-12: qty 2

## 2015-01-12 MED ORDER — LEVOTHYROXINE SODIUM 50 MCG PO TABS
50.0000 ug | ORAL_TABLET | Freq: Every day | ORAL | Status: DC
  Administered 2015-01-12 – 2015-01-16 (×5): 50 ug via ORAL
  Filled 2015-01-12 (×5): qty 1

## 2015-01-12 MED ORDER — CALCIUM CARBONATE ANTACID 500 MG PO CHEW
1.0000 | CHEWABLE_TABLET | Freq: Three times a day (TID) | ORAL | Status: DC | PRN
  Administered 2015-01-12 (×2): 200 mg via ORAL
  Filled 2015-01-12 (×2): qty 1

## 2015-01-12 MED ORDER — ALUM & MAG HYDROXIDE-SIMETH 200-200-20 MG/5ML PO SUSP
30.0000 mL | Freq: Four times a day (QID) | ORAL | Status: DC | PRN
  Administered 2015-01-12: 30 mL via ORAL
  Filled 2015-01-12: qty 30

## 2015-01-12 MED ORDER — PANTOPRAZOLE SODIUM 40 MG PO TBEC
40.0000 mg | DELAYED_RELEASE_TABLET | Freq: Two times a day (BID) | ORAL | Status: DC
  Administered 2015-01-12 – 2015-01-13 (×3): 40 mg via ORAL
  Filled 2015-01-12 (×3): qty 1

## 2015-01-12 MED ORDER — ACETAMINOPHEN 650 MG RE SUPP: 650.0000 mg | Freq: Four times a day (QID) | RECTAL | Status: DC | PRN

## 2015-01-12 MED ORDER — LISINOPRIL 10 MG PO TABS
10.0000 mg | ORAL_TABLET | Freq: Every day | ORAL | Status: DC
Start: 2015-01-12 — End: 2015-01-16
  Administered 2015-01-12 – 2015-01-16 (×6): 10 mg via ORAL
  Filled 2015-01-12 (×7): qty 1

## 2015-01-12 MED ORDER — SODIUM CHLORIDE 0.9 % IJ SOLN
3.0000 mL | Freq: Two times a day (BID) | INTRAMUSCULAR | Status: DC
  Administered 2015-01-12 – 2015-01-15 (×8): 3 mL via INTRAVENOUS

## 2015-01-12 MED ORDER — SODIUM CHLORIDE 0.9 % IV SOLN
INTRAVENOUS | Status: DC
  Administered 2015-01-12: 21:00:00 via INTRAVENOUS

## 2015-01-12 MED ORDER — ZOLPIDEM TARTRATE 5 MG PO TABS
5.0000 mg | ORAL_TABLET | Freq: Every evening | ORAL | Status: DC | PRN
  Administered 2015-01-12 – 2015-01-15 (×5): 5 mg via ORAL
  Filled 2015-01-12 (×5): qty 1

## 2015-01-12 MED ORDER — ONDANSETRON HCL 4 MG/2ML IJ SOLN: 4.0000 mg | Freq: Four times a day (QID) | INTRAMUSCULAR | Status: DC | PRN

## 2015-01-12 MED ORDER — ONDANSETRON HCL 4 MG PO TABS
4.0000 mg | ORAL_TABLET | Freq: Four times a day (QID) | ORAL | Status: DC | PRN
  Administered 2015-01-12: 4 mg via ORAL
  Filled 2015-01-12: qty 1

## 2015-01-12 MED ORDER — CEFUROXIME AXETIL 500 MG PO TABS
500.0000 mg | ORAL_TABLET | Freq: Two times a day (BID) | ORAL | Status: DC
  Administered 2015-01-12 – 2015-01-14 (×5): 500 mg via ORAL
  Filled 2015-01-12 (×5): qty 1

## 2015-01-12 MED ORDER — TRAMADOL HCL 50 MG PO TABS
50.0000 mg | ORAL_TABLET | Freq: Two times a day (BID) | ORAL | Status: DC | PRN
  Administered 2015-01-12 – 2015-01-15 (×3): 50 mg via ORAL
  Filled 2015-01-12 (×3): qty 1

## 2015-01-12 MED ORDER — POTASSIUM CHLORIDE CRYS ER 20 MEQ PO TBCR
20.0000 meq | EXTENDED_RELEASE_TABLET | Freq: Every day | ORAL | Status: DC
  Administered 2015-01-12 – 2015-01-16 (×5): 20 meq via ORAL
  Filled 2015-01-12 (×6): qty 1

## 2015-01-12 MED ORDER — FLECAINIDE ACETATE 100 MG PO TABS
100.0000 mg | ORAL_TABLET | Freq: Two times a day (BID) | ORAL | Status: DC
  Administered 2015-01-12 – 2015-01-16 (×9): 100 mg via ORAL
  Filled 2015-01-12 (×10): qty 1

## 2015-01-12 MED ORDER — ATENOLOL 25 MG PO TABS
25.0000 mg | ORAL_TABLET | Freq: Every day | ORAL | Status: DC
  Administered 2015-01-12 – 2015-01-16 (×5): 25 mg via ORAL
  Filled 2015-01-12 (×6): qty 1

## 2015-01-12 MED ORDER — GABAPENTIN 100 MG PO CAPS
100.0000 mg | ORAL_CAPSULE | Freq: Every morning | ORAL | Status: DC
  Administered 2015-01-12 – 2015-01-16 (×5): 100 mg via ORAL
  Filled 2015-01-12 (×5): qty 1

## 2015-01-12 MED ORDER — WARFARIN - PHARMACIST DOSING INPATIENT
Freq: Every day | Status: DC
  Administered 2015-01-12: 18:00:00

## 2015-01-12 NOTE — Consult Note (Signed)
[facility] Consult: 10:58 AM 01/12/2015  LOS: 1 day    Referring Provider: Dr Tana Coast  Primary Care Physician:  Kenn File, MD Primary Gastroenterologist:  Dr. Delfin Edis    Reason for Consultation:  Epigastric/chest pain.    HPI: [name] is a 82 y.o. female.  PMH parox afib, on coumadin and flecainide.  Osteo arthritis/DJD s/p knee replacements and shoulder replacement.  History TIA Gastric diverticulum per CT of 07/2011.  02/2010 EGD.  For dyspepsia and anemia.  Normal EGD, small bowel biopsies: benign, no villous atrophy.  02/2010 Colonoscopy. Internal hemorrhoids.  Moderate diverticulosis.  Sessile polyp in the sigmoid colon (path: inflammatory polyp)  07/1999 Colonoscopy for rectal bleeding and hx colon cancer in brother. Mild left and right diverticulosis, internal hemorrhoids.   07/2011 celiac profile labs wnl.    For at least a couple of years, patient has been battling dyspeptic symptoms with pyrosis, epigastric discomfort and lots of belching. symptoms significantly worse when she lays down at night so she would take Tums at bedtime. Symptoms did not awaken her at night and were not as bothersome during the day.. She had these nocturnal symptoms several times every week. She had been prescribed PPI therapy but was not taking this regularly.  On Christmas Day the patient ate a lot more fatty foods than is normal for her. The following day she had severe worsening of the epigastric pain it was radiating up into her chest and shoulders.  Seen in [facility] ED 12/26 where she ruled out for ACS and labs unrevealing (normal LFTs and lipase) though Na level lower at 128.     12/30/14 CT angio chest. 2 mm RUL nodules x 2.  Mild CM.  Mild calcific atherosclerosis of aortic arch.   She was treated with a GI  cocktail, aspirin, nitroglycerin paste, IV Pepcid, fentanyl.  The symptoms subsided during the course of her ED visit. She believes the sxs lasted maybe a couple of hours altogether. Patient was prescribed omeprazole twice daily along with scheduled doses of Maalox. She was compliant with both although she did run out of Maalox at times and substituted this with Pepto-Bismol. The meds reduced the incidences of belching but the pain persisted.   On 1/6 she developed nausea and nonbloody emesis, some of which contained partially digested recently eaten food. Patient saw some blood in her otherwise brown, formed stool; which is not unusual for her. She usually manages her rectal bleeding by using Preparation H suppositories. The pain further intensified and radiated up into the axilla so she presented back to the emergency room. Labs:  troponin I of 0.09, 0.15, 0.12, 0.07. normal LFTs and Lipase, albumin now 2.9 c/w 4.2 two weeks ago  coags therapeutic.  FOBT positive.  01/11/15 2 view CXR: mild BB atx and mild vascular congestion.  01/11/15 abd ultrasound: Cholelithiasis, but no cholecystitis. 5.3 mm CBD.  Normal liver  Cardiologist does not feel that patient is experiencing ACS, there are no ECG findings suggestive of acute ischemia.  Patient's home med list sites  Fosamax weekly, the patient says she never started taking this. She used to take Celebrex but stopped this a couple of months ago. She doesn't take aspirin or other NSAIDs at home. Patient says that she has to break up large pills in order to swallow them but generally does not have significant dysphagia to solids.    Past Medical History  Diagnosis Date  . Atrial fibrillation (HCC)     Paroxysmal, Flecainide therapy  . Warfarin anticoagulation   . Hypothyroidism   . Carotid bruit     Doppler, December, 2009, no abnormality  . Cardiomegaly     EF normal, echo, 2003  //   EF 60-65%, echo, June 18, 123456, diastolic dysfunction,  . Normal  nuclear stress test     Normal, 2001  . HLD (hyperlipidemia)   . HTN (hypertension)   . Osteoarthritis   . Calf pain     September, 2012, at rest  . PONV (postoperative nausea and vomiting)   . GERD (gastroesophageal reflux disease)   . Anemia   . Rectal bleeding 2001    diverticulosis and int hemorrhoids on 07/1999 and 02/2010 colonoscopies.  . Hypothyroidism   . Cataract   . Stroke ([place])   . Insomnia   . History of shingles     Past Surgical History  Procedure Laterality Date  . Total knee arthroplasty  05/26/10    right  . Total shoulder replacement  01/2010    left  . Shoulder open rotator cuff repair      bilateral  . Total knee arthroplasty  11/17/2010    Procedure: TOTAL KNEE ARTHROPLASTY;  Surgeon: Mauri Pole;  Location: [facility];  Service: Orthopedics;  Laterality: Left;  . Total knee arthroplasty      Right  . Cataract extraction w/phaco Right 04/15/2014    Procedure: CATARACT EXTRACTION PHACO AND INTRAOCULAR LENS PLACEMENT (IOC);  Surgeon: Tonny Branch, MD;  Location: [facility];  Service: Ophthalmology;  Laterality: Right;  CDE: 13.30  . Cataract extraction w/phaco Left 05/13/2014    Procedure: CATARACT EXTRACTION PHACO AND INTRAOCULAR LENS PLACEMENT LEFT EYE;  Surgeon: Tonny Branch, MD;  Location: [facility];  Service: Ophthalmology;  Laterality: Left;  CDE:13.00    Prior to Admission medications   Medication Sig Start Date End Date Taking? Authorizing Provider  acetaminophen (TYLENOL) 500 MG tablet Take 500 mg by mouth every 6 (six) hours as needed for moderate pain.   Yes Historical Provider, MD  alendronate (FOSAMAX) 70 MG tablet Take 1 tablet (70 mg total) by mouth once a week. Take with a full glass of water on an empty stomach. 12/16/14  Yes Tammy Eckard, PHARMD  atenolol (TENORMIN) 25 MG tablet Take 25 mg by mouth daily.    Yes Historical Provider, MD  cefdinir (OMNICEF) 300 MG capsule Take 1 capsule (300 mg total) by mouth 2 (two) times daily. 1 po BID 01/07/15  Yes Timmothy Euler, MD  dicyclomine (BENTYL) 10 MG capsule Take 10 mg by mouth daily as needed (upset stomach).   Yes Historical Provider, MD  ergocalciferol (VITAMIN D2) 50000 UNITS capsule Take 50,000 Units by mouth once a week. Pt takes on sunday   Yes Historical Provider, MD  flecainide (TAMBOCOR) 100 MG tablet Take 100 mg by mouth 2 (two) times daily.    Yes Historical Provider, MD  fluticasone (FLONASE) 50 MCG/ACT nasal spray Use 2 sprays in each  nostril once daily 11/01/14  Yes Historical Provider, MD  furosemide (LASIX) 40  MG tablet Take 0.5-1 tablets (20-40 mg total) by mouth every morning. 11/25/14  Yes Timmothy Euler, MD  gabapentin (NEURONTIN) 100 MG capsule Take 1 tab in the morning Patient taking differently: Take 100 mg by mouth every morning. Take 1 tab in the morning 12/17/14  Yes Sharion Balloon, FNP  GARLIC PO Take 1 tablet by mouth daily.   Yes Historical Provider, MD  levothyroxine (SYNTHROID, LEVOTHROID) 50 MCG tablet Take 50 mcg by mouth every morning.    Yes Historical Provider, MD  meclizine (ANTIVERT) 25 MG tablet Take 25 mg by mouth daily as needed. Inner ear   Yes Historical Provider, MD  mupirocin ointment (BACTROBAN) 2 % Place 1 application into the nose 2 (two) times daily. 11/25/14  Yes Timmothy Euler, MD  omeprazole (PRILOSEC) 20 MG capsule Take 1 po BID x 2 weeks then once a day Patient taking differently: Take 20 mg by mouth 2 (two) times daily before a meal. Take 1 po BID x 2 weeks then once a day 12/30/14  Yes Rolland Porter, MD  potassium chloride SA (K-DUR,KLOR-CON) 20 MEQ tablet Take 20 mEq by mouth daily.  10/19/12  Yes Historical Provider, MD  traMADol (ULTRAM) 50 MG tablet Take 1 tablet (50 mg total) by mouth every 8 (eight) hours as needed. 12/17/14  Yes Sharion Balloon, FNP  vitamin B-12 (CYANOCOBALAMIN) 500 MCG tablet Take 500 mcg by mouth daily.    Yes Historical Provider, MD  warfarin (COUMADIN) 5 MG tablet Take 5-7.5 mg by mouth every morning. 7.5mg  on  Wednesday and Saturday and 5 mg all other days.   Yes Historical Provider, MD  zolpidem (AMBIEN) 10 MG tablet Take 0.5-1 tablets (5-10 mg total) by mouth at bedtime as needed for sleep. Take 1/2 to 1 tablet at bedtime as needed 12/17/14  Yes Sharion Balloon, FNP    Scheduled Meds: . atenolol  25 mg Oral Daily  . cefUROXime  500 mg Oral BID  . flecainide  100 mg Oral BID  . furosemide  20 mg Oral Daily  . gabapentin  100 mg Oral q morning - 10a  . levothyroxine  50 mcg Oral QAC breakfast  . lisinopril  10 mg Oral Daily  . pantoprazole  40 mg Oral BID AC  . potassium chloride SA  20 mEq Oral Daily  . sodium chloride  3 mL Intravenous Q12H   Infusions:   PRN Meds: acetaminophen **OR** acetaminophen, alum & mag hydroxide-simeth, calcium carbonate, ondansetron **OR** ondansetron (ZOFRAN) IV, traMADol, zolpidem   Allergies as of 01/11/2015 - Review Complete 01/11/2015  Allergen Reaction Noted  . Penicillins Rash     Family History  Problem Relation Age of Onset  . COPD Brother   . Atrial fibrillation Brother   . Diabetes Brother   . Liver cancer Brother     Alcohol-related  . Colon cancer Brother   . Heart attack Father   . Rheum arthritis Mother   . Arthritis Mother   . Heart disease Mother   . Heart attack Daughter   . Coronary artery disease Brother   . Gallbladder disease Brother     Social History   Social History  . Marital Status: Married    Spouse Name: N/A  . Number of Children: N/A  . Years of Education: N/A   Occupational History  . Not on file.   Social History Main Topics  . Smoking status: Never Smoker   . Smokeless tobacco: Never Used  .  Alcohol Use: No  . Drug Use: No  . Sexual Activity: No   Other Topics Concern  . Not on file   Social History Narrative    REVIEW OF SYSTEMS: Constitutional:   Patient's weight reflects a 9 pound drop compared with 12/30/14 so over about a 2 week period ENT:  No nose bleeds Pulm:  no cough, no DOE. CV:   No palpitations, no LE edema.  GU:  No hematuria, no frequency GI:  Per HPI Heme:   No unusual bleeding or bruising. No history of requiring iron supplementation or of significant anemia.   Transfusions:   Patient recalls no history of blood transfusions. Neuro:   Patient's balance and gait are somewhat compromised. She has occasional dizziness. She is taken falls at home on more than a few occasions in the last few months. She says most of the time she trips and its not the dizziness that causes her to fall.No headaches, no peripheral tingling or numbness Derm:  No itching, no rash or sores.  Endocrine:  No sweats or chills.  No polyuria or dysuria Immunization:   Up-to-date on flu and pneumococcal vaccination Travel:  None beyond local counties in last few months.    PHYSICAL EXAM: Vital signs in last 24 hours: Filed Vitals:   01/12/15 0328 01/12/15 0838  BP: 165/65 155/69  Pulse: 78 76  Temp: 98.3 F (36.8 C) 98 F (36.7 C)  Resp: 18 18   Wt Readings from Last 3 Encounters:  01/12/15 64.184 kg (141 lb 8 oz)  01/07/15 66.407 kg (146 lb 6.4 oz)  12/30/14 68.04 kg (150 lb)   General:  pleasant, somewhat frail-appearing, comfortable elderly WF Head:   No signs of trauma, no asymmetry or facial swelling.  Eyes:   No scleral icterus or conjunctival pallor. EOMI. Ears:   Slightly HOH.  Nose:   No discharge or congestion Mouth:   Clear, moist oral mucosa. Neck:   No JVD, no masses, no bruits, no TMG Lungs:   Clear bilaterally to auscultation and percussion. No labored breathing. No cough. Heart:  RRR. No MRG. S1/S2 audible. Abdomen:   Soft. Slight epigastric tenderness no right upper quadrant tenderness.. Not distended.  Bowel sounds active..   Rectal:  palpable internal hemorrhoids. Flexible sigmoidoscopy of blood on exam glove tests FOBT positive.   Musc/Skeltl:  postsurgical changes in the knees and shoulders. No swelling. Arthritic deformity in the fingers and  knees. Extremities:   No CCE.  Neurologic:   Patient is alert. Oriented 3. No tremor. Moves all 4 limbs, limb strength not tested. Skin:   No rash, no telangiectasia, no suspicious lesions. Tattoos:   None Nodes:   No cervical adenopathy.   Psych:   Pleasant, cooperative, relaxed.  Intake/Output from previous day: 01/07 0701 - 01/08 0700 In: 462 [P.O.:462] Out: 375 [Urine:375] Intake/Output this shift: Total I/O In: 280 [P.O.:280] Out: 301 [Urine:300; Stool:1]  LAB RESULTS:  Recent Labs  01/11/15 2101 01/12/15 0710  WBC 6.6 5.8  HGB 11.9* 11.3*  HCT 35.6* 34.0*  PLT 353 321   BMET Lab Results  Component Value Date   NA 127* 01/12/2015   NA 128* 01/11/2015   NA 135 12/30/2014   K 4.0 01/12/2015   K 4.0 01/11/2015   K 3.6 12/30/2014   CL 91* 01/12/2015   CL 90* 01/11/2015   CL 101 12/30/2014   CO2 26 01/12/2015   CO2 27 01/11/2015   CO2 24 12/30/2014   GLUCOSE 111*  01/12/2015   GLUCOSE 133* 01/11/2015   GLUCOSE 110* 12/30/2014   BUN <5* 01/12/2015   BUN 7 01/11/2015   BUN 16 12/30/2014   CREATININE 0.56 01/12/2015   CREATININE 0.68 01/11/2015   CREATININE 0.67 12/30/2014   CALCIUM 8.3* 01/12/2015   CALCIUM 8.5* 01/11/2015   CALCIUM 8.8* 12/30/2014   LFT  Recent Labs  01/11/15 2101 01/12/15 0710  PROT 6.7 6.3*  ALBUMIN 3.2* 2.9*  AST 25 24  ALT 19 16  ALKPHOS 64 59  BILITOT 0.2* 0.4   PT/INR Lab Results  Component Value Date   INR 2.58* 01/12/2015   INR 3.09* 01/11/2015   INR 2.8 12/31/2014   Hepatitis Panel No results for input(s): HEPBSAG, HCVAB, HEPAIGM, HEPBIGM in the last 72 hours. C-Diff No components found for: CDIFF Lipase     Component Value Date/Time   LIPASE 26 01/11/2015 2101    Drugs of Abuse  No results found for: LABOPIA, COCAINSCRNUR, LABBENZ, AMPHETMU, THCU, LABBARB   RADIOLOGY STUDIES: Dg Chest 2 View  01/11/2015  CLINICAL DATA:  Acute onset of epigastric abdominal pain and vomiting. Generalized weakness.  Subacute onset of cough, and acute rectal bleeding. Initial encounter. EXAM: CHEST  2 VIEW COMPARISON:  Chest radiograph and CTA of the chest performed 12/30/2014 FINDINGS: The lungs are well-aerated. Mild vascular congestion is noted, with mild bibasilar atelectasis. There is no evidence of pleural effusion or pneumothorax. The heart is borderline normal in size. No acute osseous abnormalities are seen. IMPRESSION: Mild vascular congestion noted, with mild bibasilar atelectasis. Electronically Signed   By: Garald Balding M.D.   On: 01/11/2015 22:09   US Abdomen Limited Ruq  01/11/2015  CLINICAL DATA:  Right upper quadrant pain for 1 day. Associated nausea and vomiting. EXAM: US ABDOMEN LIMITED - RIGHT UPPER QUADRANT COMPARISON:  CT of the abdomen pelvis dated 08/04/2011 FINDINGS: Gallbladder: There are 2 mobile gallstones measuring less than 1.5 cm. No gallbladder with wall thickening visualized, gallbladder wall measures 2.7 mm. No sonographic Murphy sign noted by sonographer. Common bile duct: Diameter: 5.3 mm Liver: No focal lesion identified. Within normal limits in parenchymal echogenicity. IMPRESSION: Cholelithiasis without sonographic evidence of acute cholecystitis. Electronically Signed   By: Fidela Salisbury M.D.   On: 01/11/2015 22:46    ENDOSCOPIC STUDIES: Per HPI.    IMPRESSION:   *  Radiating upper abdominal discomfort. Long-standing history of similar but less severe symptoms going on at least 2 years.  symptoms worsen 2 weeks ago and appears to be associated with more than normal intake of fatty foods.  Cholelithiasis but no cholecystitis on ultrasound. Repeated LFTs and lipase levels normal. 2012 EGD for dyspepsia with normal mucosa and normal small bowel biopsies. After initiating twice daily omeprazole 2 weeks ago, eructation improved but pain persists and n/v developed 48 hours PTA.   Rule out biliary colic, rule out peptic ulcer disease/GERD.   *   chronic Coumadin for  history of paroxysmal A. Fib.  Last dose of warfarin was on 01/10/15  *  Intermittent rectal bleeding, long-standing.  Int rrhoids on previous colonoscopies and on today's exam.   *  Hyponatremia.   PLAN:     *  Scheduled her for upper endoscopy tomorrow. If EGD findings do not explain the patient's symptoms, we will obtain general surgical consult for evaluation of biliary colic and for their opinion regarding laparoscopic cholecystectomy.  *  Okay to eat heart healthy diet today.  *  Continue bid oral protonix.   *  Added anusol HC suppositories.   *  No need to hold Coumadin pre EGD   Azucena Freed  01/12/2015, 10:58 AM Pager: [phone]  GI ATTENDING  History, laboratories, x-rays, prior endoscopy reports personally reviewed. Patient personally seen and examined. Patient's husband and sister in room. Agree with comprehensive consultation note as outlined above. Patient presents with severe epigastric pain with radiation into her chest and back. Second such episode in 2 weeks. Cardiac cause ruled out. She does have dyspeptic symptoms for which PPI was started. She is occasional NSAIDs. Ultrasound shows cholelithiasis without cholecystitis or ductal dilation. Liver tests normal. Pain generally abates within a few hours. I suspect that her recurrent pain as described as related to symptomatic cholelithiasis without cholecystitis or choledocholithiasis. We will plan diagnostic upper endoscopy (on Coumadin with Dr. Hilarie Fredrickson) to rule out other causes. The nature of the procedure, as well as the risks, benefits, and alternatives were carefully and thoroughly reviewed with the patient. Ample time for discussion and questions allowed. The patient understood, was satisfied, and agreed to proceed.Agree with empiric twice a day PPI to remove additional variables. If upper endoscopy unremarkable would recommend surgical consultation for probable symptomatic cholelithiasis leading to recurrent  hospitalizations.  Docia Chuck. Geri Seminole., M.D. [facility] Division of Gastroenterology

## 2015-01-12 NOTE — Progress Notes (Signed)
[facility]                                                                              Patient Demographics  [name], is a 82 y.o. female, DOB - 01-05-1932, [id number]  Admit date - 01/11/2015   Admitting Physician Edwin Dada, MD  Outpatient Primary MD for the patient is Kenn File, MD  LOS - 1   Chief Complaint  Patient presents with  . Rectal Bleeding  . Weakness       Brief HPI   [name] is a 82 y.o. female with a past medical history significant for pAF on flecainide and warfarin, HTN, hypothyroidism, and GERD presented with worsening epigastric pain for two weeks. Patient reported off-and-on epigastric pain,  reflux symptoms, over the last 6 months, now worse over the last 2 weeks.  2 weeks ago, patient was seen in ER at [facility] on 12/26, where she had 2 hour troponin rule out, negative CT angiogram of the chest (without PE or dissection), and was discharged with plans for follow up RUQ [place]. The night before the admission, patient noticed blood in the stool, has history of bleeding prolapsed hemorrhoids. Right upper quadrant ultrasound showed gallbladder stones without inflammation. TRH was asked to evaluate for admission for elevated troponin and epigastric pain. Patient also reported generalized weakness and exercise intolerance, has chronic orthopnea and peripheral edema.  Assessment & Plan    Principal Problem:  Epigastric discomfort, atypical chest pain with GI symptoms, slightly positive troponin - Suspect severe GERD versus angina equivalent, biliary colic  - Troponins slightly positive, cardiology following, recommending 2-D echocardiogram - Placed on PPI BID, GI consulted, planning endoscopy in a.m.   Weakness, progressive/orthopnea, fluid overload:  - Follow 2-D echocardiogram, EF  Hyponatremia:  - BNP elevated 273, serum osmolarity 263, urinalysis 323 - Appears to have some fluid overload rather than  dehydration, continue Lasix and follow BMET  Positive FOBT: Possibly from hemorrhoids -  GI consult called, endoscopy in a.m.  Paroxysmal atrial fibrillation Stable. CHADS2Vasc 4. Currently sinus with frequent PVCs. -Continue warfarin and atenolol and flecainide.  Hypothyroidism:  -Continue levothyroxine.   Hypertension:  -Continue atenolol and furosemide -Add lisinopril 10 mg daily   Code Status: Full CODE STATUS  Family Communication: Discussed in detail with the patient, all imaging results, lab results explained to the patient    Disposition Plan:   Time Spent in minutes  25 minutes  Procedures  None  Consults   GI Cardiology  DVT Prophylaxis warfarin  Medications  Scheduled Meds: . atenolol  25 mg Oral Daily  . cefUROXime  500 mg Oral BID  . flecainide  100 mg Oral BID  . furosemide  20 mg Oral Daily  . gabapentin  100 mg Oral q morning - 10a  . hydrocortisone  25 mg Rectal BID  . levothyroxine  50 mcg Oral QAC breakfast  . lisinopril  10 mg Oral Daily  . pantoprazole  40 mg Oral BID AC  . potassium chloride SA  20 mEq Oral Daily  . sodium chloride  3 mL Intravenous Q12H   Continuous Infusions:  PRN Meds:.acetaminophen **OR** acetaminophen, alum & mag hydroxide-simeth, calcium carbonate, ondansetron **OR** ondansetron (ZOFRAN) IV, traMADol, zolpidem   Antibiotics   Anti-infectives    Start     Dose/Rate Route Frequency Ordered Stop   01/12/15 1000  cefUROXime (CEFTIN) tablet 500 mg     500 mg Oral 2 times daily 01/12/15 0051          Subjective:   [name] was seen and examined today. Currently, no significant chest pain. Patient reports abdominal pain after eating. Denies any fevers or chills, no single shortness of breath.  Patient denies dizziness, chest pain, shortness of breath, abdominal pain, new weakness, numbess, tingling. No acute events overnight.    Objective:   Blood pressure 155/69, pulse 76, temperature 98 F (36.7  C), temperature source Oral, resp. rate 18, height 5\' 3"  (1.6 m), weight 64.184 kg (141 lb 8 oz), SpO2 96 %.  Wt Readings from Last 3 Encounters:  01/12/15 64.184 kg (141 lb 8 oz)  01/07/15 66.407 kg (146 lb 6.4 oz)  12/30/14 68.04 kg (150 lb)     Intake/Output Summary (Last 24 hours) at 01/12/15 1204 Last data filed at 01/12/15 0900  Gross per 24 hour  Intake    742 ml  Output    676 ml  Net     66 ml    Exam  General: Alert and oriented x 3, NAD  HEENT:  PERRLA, EOMI  Neck: Supple, no JVD, no masses  CVS: S1 S2 auscultated, no rubs, murmurs or gallops. Regular rate and rhythm.  Respiratory: Clear to auscultation bilaterally, no wheezing, rales or rhonchi  Abdomen: Soft, slight epigastric tenderness, RUQ TTP, nondistended, + bowel sounds  Ext: no cyanosis clubbing or edema  Neuro: AAOx3, Cr N's II- XII. Strength 5/5 upper and lower extremities bilaterally  Skin: No rashes  Psych: Normal affect and demeanor, alert and oriented x3    Data Review   Micro Results No results found for this or any previous visit (from the past 240 hour(s)).  Radiology Reports Dg Chest 2 View  01/11/2015  CLINICAL DATA:  Acute onset of epigastric abdominal pain and vomiting. Generalized weakness. Subacute onset of cough, and acute rectal bleeding. Initial encounter. EXAM: CHEST  2 VIEW COMPARISON:  Chest radiograph and CTA of the chest performed 12/30/2014 FINDINGS: The lungs are well-aerated. Mild vascular congestion is noted, with mild bibasilar atelectasis. There is no evidence of pleural effusion or pneumothorax. The heart is borderline normal in size. No acute osseous abnormalities are seen. IMPRESSION: Mild vascular congestion noted, with mild bibasilar atelectasis. Electronically Signed   By: Garald Balding M.D.   On: 01/11/2015 22:09   Dg Chest 2 View  12/30/2014  CLINICAL DATA:  Acute onset of central chest pain, radiating to the shoulders. Initial encounter. EXAM: CHEST  2 VIEW  COMPARISON:  Chest radiograph performed 08/10/2013 FINDINGS: The lungs are well-aerated. A small calcified granuloma is suggested at the right midlung zone. There is no evidence of focal opacification, pleural effusion or pneumothorax. The heart is normal in size; the mediastinal contour is within normal limits. No acute osseous abnormalities are seen. A left shoulder arthroplasty is partially imaged. IMPRESSION: No acute cardiopulmonary process seen. Electronically Signed   By: Garald Balding M.D.   On: 12/30/2014 02:08   Ct Angio Chest Aorta W/cm &/or Wo/cm  12/30/2014  CLINICAL DATA:  Central chest pain and pressure radiating to shoulders. History of atrial fibrillation, into coagulation, hypertension, hyperlipidemia. EXAM: CT ANGIOGRAPHY CHEST  WITH CONTRAST TECHNIQUE: Multidetector CT imaging of the chest was performed using the standard protocol during bolus administration of intravenous contrast. Multiplanar CT image reconstructions and MIPs were obtained to evaluate the vascular anatomy. CONTRAST:  173mL OMNIPAQUE IOHEXOL 350 MG/ML SOLN COMPARISON:  Chest radiograph December 30, 2014 FINDINGS: No abnormal density along the course of the thoracic aorta on precontrast imaging. Thoracic aorta is normal in course caliber. Mild calcific atherosclerosis of the aortic arch. No dissection, luminal irregularity, aneurysm, periaortic fluid collection or contrast extravasation. Though not tailored for evaluation, no pulmonary embolism. No lymphadenopathy by CT size criteria. The heart is mildly enlarged. Minimal coronary artery calcifications. No pericardial effusions. 2 mm RIGHT upper lobe, 2 mm RIGHT upper lobe pulmonary nodule. No pleural effusion or focal consolidation. No pleural effusion. Dependent atelectasis. Tracheobronchial tree is patent and midline. No pneumothorax. Mildly elevated RIGHT hemidiaphragm. 13 mm low-density lesion in the spleen may represent lymphangioma or cyst. Thickened adrenal glands  without discrete nodule. 2.2 cm RIGHT upper pole renal cyst. Status post LEFT shoulder arthroplasty. Minimal degenerative change, less than expected for age. IMPRESSION: Mild cardiomegaly.  No acute pulmonary process. Mild calcific atherosclerosis of the aortic arch, no acute vascular process. Two 2 mm RIGHT upper lobe pulmonary nodules. If the patient is at high risk for bronchogenic carcinoma, follow-up chest CT at 1 year is recommended. If the patient is at low risk, no follow-up is needed. This recommendation follows the consensus statement: Guidelines for Management of Small Pulmonary Nodules Detected on CT Scans: A Statement from the [employer] as published in Radiology 2005; 237:395-400. Electronically Signed   By: Elon Alas M.D.   On: 12/30/2014 05:39   US Abdomen Limited Ruq  01/11/2015  CLINICAL DATA:  Right upper quadrant pain for 1 day. Associated nausea and vomiting. EXAM: US ABDOMEN LIMITED - RIGHT UPPER QUADRANT COMPARISON:  CT of the abdomen pelvis dated 08/04/2011 FINDINGS: Gallbladder: There are 2 mobile gallstones measuring less than 1.5 cm. No gallbladder with wall thickening visualized, gallbladder wall measures 2.7 mm. No sonographic Murphy sign noted by sonographer. Common bile duct: Diameter: 5.3 mm Liver: No focal lesion identified. Within normal limits in parenchymal echogenicity. IMPRESSION: Cholelithiasis without sonographic evidence of acute cholecystitis. Electronically Signed   By: Fidela Salisbury M.D.   On: 01/11/2015 22:46    CBC  Recent Labs Lab 01/11/15 2101 01/12/15 0710  WBC 6.6 5.8  HGB 11.9* 11.3*  HCT 35.6* 34.0*  PLT 353 321  MCV 91.3 91.2  MCH 30.5 30.3  MCHC 33.4 33.2  RDW 13.6 13.4    Chemistries   Recent Labs Lab 01/11/15 2101 01/12/15 0710  NA 128* 127*  K 4.0 4.0  CL 90* 91*  CO2 27 26  GLUCOSE 133* 111*  BUN 7 <5*  CREATININE 0.68 0.56  CALCIUM 8.5* 8.3*  AST 25 24  ALT 19 16  ALKPHOS 64 59  BILITOT 0.2* 0.4    ------------------------------------------------------------------------------------------------------------------ estimated creatinine clearance is 50.6 mL/min (by C-G formula based on Cr of 0.56). ------------------------------------------------------------------------------------------------------------------ No results for input(s): HGBA1C in the last 72 hours. ------------------------------------------------------------------------------------------------------------------ No results for input(s): CHOL, HDL, LDLCALC, TRIG, CHOLHDL, LDLDIRECT in the last 72 hours. ------------------------------------------------------------------------------------------------------------------ No results for input(s): TSH, T4TOTAL, T3FREE, THYROIDAB in the last 72 hours.  Invalid input(s): FREET3 ------------------------------------------------------------------------------------------------------------------ No results for input(s): VITAMINB12, FOLATE, FERRITIN, TIBC, IRON, RETICCTPCT in the last 72 hours.  Coagulation profile  Recent Labs Lab 01/11/15 2101 01/12/15 0710  INR 3.09* 2.58*    No results  for input(s): DDIMER in the last 72 hours.  Cardiac Enzymes  Recent Labs Lab 01/12/15 0122 01/12/15 0710  TROPONINI 0.12* 0.07*   ------------------------------------------------------------------------------------------------------------------ Invalid input(s): POCBNP  No results for input(s): GLUCAP in the last 72 hours.   Jarrick Fjeld M.D. [facility] 01/12/2015, 12:04 PM  Pager: [phone] Between 7am to 7pm - call Pager - [phone]  After 7pm go to [email or web address] - password [email or web address]  Call night coverage person covering after 7pm

## 2015-01-12 NOTE — Progress Notes (Signed)
    Subjective:  Denies CP or dyspnea; Continues with mid abdominal/epigastric pain. No further nausea or vomiting.   Objective:  Filed Vitals:   01/12/15 0012 01/12/15 0031 01/12/15 0328 01/12/15 0838  BP: 181/77 179/68 165/65 155/69  Pulse: 72 76 78 76  Temp:  97.9 F (36.6 C) 98.3 F (36.8 C) 98 F (36.7 C)  TempSrc:  Oral Oral Oral  Resp:  18 18 18   Height:  5\' 3"  (1.6 m)    Weight:  141 lb 8 oz (64.184 kg)    SpO2:  96% 96% 96%    Intake/Output from previous day:  Intake/Output Summary (Last 24 hours) at 01/12/15 1156 Last data filed at 01/12/15 0900  Gross per 24 hour  Intake    742 ml  Output    676 ml  Net     66 ml    Physical Exam: Physical exam: Well-developed well-nourished in no acute distress.  Skin is warm and dry.  HEENT is normal.  Neck is supple.  Chest is clear to auscultation with normal expansion.  Cardiovascular exam is regular rate and rhythm.  Abdominal exam mild midabdominal tenderness to palpation; not distended. No masses palpated. Extremities show no edema. neuro grossly intact    Lab Results: Basic Metabolic Panel:  Recent Labs  01/11/15 2101 01/12/15 0710  NA 128* 127*  K 4.0 4.0  CL 90* 91*  CO2 27 26  GLUCOSE 133* 111*  BUN 7 <5*  CREATININE 0.68 0.56  CALCIUM 8.5* 8.3*   CBC:  Recent Labs  01/11/15 2101 01/12/15 0710  WBC 6.6 5.8  HGB 11.9* 11.3*  HCT 35.6* 34.0*  MCV 91.3 91.2  PLT 353 321   Cardiac Enzymes:  Recent Labs  01/12/15 0122 01/12/15 0710  TROPONINI 0.12* 0.07*     Assessment/Plan:  1 abdominal pain-etiology unclear. Patient's predominant complaint at time of presentation was nausea/vomiting and mid abdominal/epigastric pain. She also had hematochezia. She may require further GI evaluation. Would hold Coumadin for now until decision made. Agree with PPI. 2 mildly elevated troponin-there is no clear trend and I do not think this is consistent with an acute coronary syndrome. No ST changes  noted on initial electrocardiogram. Plan echocardiogram to assess wall motion. If normal we will plan a functional study for risk stratification. Note her abdominal/epigastric pain has been continuous for the past 2-3 weeks. There is associated belching and the pain increases with eating certain foods. I do not think it is cardiac in etiology. 3 history of paroxysmal atrial fibrillation-continue flecainide. Resume Coumadin later once it is clear GI bleeding has stopped. 4 hypertension-blood pressure is elevated. Lisinopril has been initiated. Follow blood pressure and increase medications as needed.  Kirk Ruths 01/12/2015, 11:56 AM

## 2015-01-12 NOTE — Progress Notes (Signed)
ANTICOAGULATION CONSULT NOTE - Initial Consult  Pharmacy Consult for Warfarin  Indication: atrial fibrillation  Allergies  Allergen Reactions  . Penicillins Rash   Patient Measurements: Height: 5\' 3"  (160 cm) Weight: 141 lb 8 oz (64.184 kg) (scale b) IBW/kg (Calculated) : 52.4  Vital Signs: Temp: 97.9 F (36.6 C) (01/08 0031) Temp Source: Oral (01/08 0031) BP: 179/68 mmHg (01/08 0031) Pulse Rate: 76 (01/08 0031)  Labs:  Recent Labs  01/11/15 2101  HGB 11.9*  HCT 35.6*  PLT 353  LABPROT 31.3*  INR 3.09*  CREATININE 0.68    Estimated Creatinine Clearance: 50.6 mL/min (by C-G formula based on Cr of 0.68).   Medical History: Past Medical History  Diagnosis Date  . Atrial fibrillation (HCC)     Paroxysmal, Flecainide therapy  . Warfarin anticoagulation   . Drug therapy     Flecainide for atrial fibrillation  . Hypothyroidism   . Carotid bruit     Doppler, December, 2009, no abnormality  . Ejection fraction     EF normal, echo, 2003  //   EF 60-65%, echo, June 18, 123456, diastolic dysfunction,  . Normal nuclear stress test     Normal, 2001  . Diverticulosis   . HLD (hyperlipidemia)   . HTN (hypertension)   . Osteoarthritis   . Preop cardiovascular exam   . Calf pain     September, 2012, at rest  . PONV (postoperative nausea and vomiting)   . GERD (gastroesophageal reflux disease)   . Anemia   . Internal hemorrhoid   . Hypothyroidism   . Cataract   . Stroke ([place])   . Insomnia   . History of shingles    Assessment: 82 y/o F on warfarin PTA for h/o afib, here with after two large episodes of emesis, INR is 3.09 on admit, current warfarin dose per outpatient anti-coag notes is 2.5 mg on Fri and 5 mg all other days.   Goal of Therapy:  INR 2-3 Monitor platelets by anticoagulation protocol: Yes   Plan:  -Check INR with AM labs to ensure back in therapeutic range, if so, resume home dose -Monitor for bleeding  Narda Bonds 01/12/2015,12:56 AM

## 2015-01-12 NOTE — Progress Notes (Signed)
ANTICOAGULATION CONSULT NOTE - Initial Consult  Pharmacy Consult for Warfarin  Indication: atrial fibrillation  Allergies  Allergen Reactions  . Penicillins Rash   Patient Measurements: Height: 5\' 3"  (160 cm) Weight: 141 lb 8 oz (64.184 kg) (scale b) IBW/kg (Calculated) : 52.4  Vital Signs: Temp: 98 F (36.7 C) (01/08 0838) Temp Source: Oral (01/08 0838) BP: 155/69 mmHg (01/08 0838) Pulse Rate: 76 (01/08 0838)  Labs:  Recent Labs  01/11/15 2101 01/12/15 0122 01/12/15 0710  HGB 11.9*  --  11.3*  HCT 35.6*  --  34.0*  PLT 353  --  321  LABPROT 31.3*  --  27.3*  INR 3.09*  --  2.58*  CREATININE 0.68  --  0.56  TROPONINI  --  0.12* 0.07*    Estimated Creatinine Clearance: 50.6 mL/min (by C-G formula based on Cr of 0.56).   Medical History: Past Medical History  Diagnosis Date  . Atrial fibrillation (HCC)     Paroxysmal, Flecainide therapy  . Warfarin anticoagulation   . Hypothyroidism   . Carotid bruit     Doppler, December, 2009, no abnormality  . Cardiomegaly     EF normal, echo, 2003  //   EF 60-65%, echo, June 18, 123456, diastolic dysfunction,  . Normal nuclear stress test     Normal, 2001  . HLD (hyperlipidemia)   . HTN (hypertension)   . Osteoarthritis   . Calf pain     September, 2012, at rest  . PONV (postoperative nausea and vomiting)   . GERD (gastroesophageal reflux disease)   . Anemia   . Rectal bleeding 2001    diverticulosis and int hemorrhoids on 07/1999 and 02/2010 colonoscopies.  . Hypothyroidism   . Cataract   . Stroke ([place])   . Insomnia   . History of shingles    Assessment: 82 y/o F on warfarin PTA for h/o afib, here with after two large episodes of emesis, INR is 3.09 on admit, current warfarin dose per outpatient anti-coag notes is 2.5 mg on Fri and 5 mg all other days.   INR 2.58 today, Hgb 11.3, PLT 321. No bleeding noted.    Goal of Therapy:  INR 2-3 Monitor platelets by anticoagulation protocol: Yes   Plan:  -Coumadin  5mg  x 1 tonight  -Daily INR for now  -Monitor for bleeding  Charline Hoskinson C. Lennox Grumbles, PharmD Pharmacy Resident  Pager: [phone] 01/12/2015 2:57 PM

## 2015-01-12 NOTE — Evaluation (Signed)
Physical Therapy Evaluation Patient Details Name: [name] MRN: [id number] DOB: January 11, 1932 Today's Date: 01/12/2015   History of Present Illness  [name] is a 82 y.o. female with a past medical history significant for pAF on flecainide and warfarin, HTN, hypothyroidism, and GERD who presents with worsening epigastric pain for two weeks.  Clinical Impression  Pt admitted with above diagnosis. Pt currently with functional limitations due to the deficits listed below (see PT Problem List). Pt with generalized weakness and quick fatigue with mobility, balance issues noted, pt safer with RW. Pt becomes nauseous with activity.  Pt will benefit from skilled PT to increase their independence and safety with mobility to allow discharge to the venue listed below.       Follow Up Recommendations Home health PT;Supervision for mobility/OOB    Equipment Recommendations  None recommended by PT    Recommendations for Other Services OT consult     Precautions / Restrictions Precautions Precautions: Fall Precaution Comments: pt has had multiple falls at home and her husband has had mutliple falls recently as well Restrictions Weight Bearing Restrictions: No      Mobility  Bed Mobility Overal bed mobility: Modified Independent             General bed mobility comments: pt able to get up and down from flat bed but flat bed caused nausea and belching and pt had to sit back up quickly  Transfers Overall transfer level: Needs assistance Equipment used: None Transfers: Sit to/from Stand Sit to Stand: Supervision         General transfer comment: supervision for safety from bed and toilet. No LOB but nausea noted with changes in position  Ambulation/Gait Ambulation/Gait assistance: Min assist;Min guard Ambulation Distance (Feet): 160 Feet Assistive device: Rolling walker (2 wheeled);None Gait Pattern/deviations: Step-through pattern;Decreased stride length Gait velocity:  decreased Gait velocity interpretation: Below normal speed for age/gender General Gait Details: pt began ambulation without AD but was unsteady, slow, and was reaching for stable surfaces. RW given and pt able to ambulate with min-guard A. Fatigued quickly  [employer] (Stroke Patients Only)       Balance Overall balance assessment: Needs assistance Sitting-balance support: No upper extremity supported Sitting balance-Leahy Scale: Good     Standing balance support: No upper extremity supported Standing balance-Leahy Scale: Fair                               Pertinent Vitals/Pain Pain Assessment: Faces Faces Pain Scale: Hurts even more Pain Location: abdomen, as well as nausea and frequent belching Pain Descriptors / Indicators: Aching Pain Intervention(s): Limited activity within patient's tolerance;Monitored during session         VSS  Home Living [name]/patient expects to be discharged to:: Private residence Living Arrangements: Spouse/significant other Available Help at Discharge: [name];Available 24 hours/day Type of Home: House Home Access: Stairs to enter   [employer] of Steps: 1 Home Layout: Laundry or work area in basement;Two level;Able to live on main level with bedroom/bathroom Home Equipment: Walker - 2 wheels Additional Comments: pt has exercise equipment she was using until recently and was able to get downstairs until last few months. Recently, her husband feel while trying to help her and she fell on top of him. Neither were able to get up. [name] lives nearby and is elderly  but in good health and checks on them regularly    Prior Function Level of Independence: Independent               Hand Dominance        Extremity/Trunk Assessment   Upper Extremity Assessment: Generalized weakness           Lower Extremity Assessment: Generalized weakness      Cervical /  Trunk Assessment: Normal  Communication   Communication: No difficulties  Cognition Arousal/Alertness: Awake/alert Behavior During Therapy: WFL for tasks assessed/performed Overall Cognitive Status: Within Functional Limits for tasks assessed                      General Comments      Exercises        Assessment/Plan    PT Assessment Patient needs continued PT services  PT Diagnosis Difficulty walking;Generalized weakness;Acute pain   PT Problem List Decreased strength;Decreased activity tolerance;Decreased balance;Decreased mobility;Decreased knowledge of use of DME;Decreased knowledge of precautions;Pain  PT Treatment Interventions DME instruction;Gait training;Stair training;Functional mobility training;Therapeutic activities;Therapeutic exercise;Balance training;Patient/[name] education   PT Goals (Current goals can be found in the Care Plan section) Acute Rehab PT Goals Patient Stated Goal: return home, resolution of abdominal pain and nausea PT Goal Formulation: With patient Time For Goal Achievement: 01/26/15 Potential to Achieve Goals: Good    Frequency Min 3X/week   Barriers to discharge Decreased caregiver support pt's husband with balance issues and fall history as well    Co-evaluation               End of Session Equipment Utilized During Treatment: Gait belt Activity Tolerance: Patient limited by fatigue Patient left: in bed;with call bell/phone within reach;with [name]/visitor present Nurse Communication: Mobility status    Functional Assessment Tool Used: clinical judgement Functional Limitation: Mobility: Walking and moving around Mobility: Walking and Moving Around Current Status [phone]): At least 1 percent but less than 20 percent impaired, limited or restricted Mobility: Walking and Moving Around Goal Status [phone]): 0 percent impaired, limited or restricted    Time: 0857-0922 PT Time Calculation (min) (ACUTE ONLY): 25  min   Charges:   PT Evaluation $PT Eval Moderate Complexity: 1 Procedure PT Treatments $Gait Training: 8-22 mins   PT G Codes:   PT G-Codes **NOT FOR INPATIENT CLASS** Functional Assessment Tool Used: clinical judgement Functional Limitation: Mobility: Walking and moving around Mobility: Walking and Moving Around Current Status [phone]): At least 1 percent but less than 20 percent impaired, limited or restricted Mobility: Walking and Moving Around Goal Status [phone]): 0 percent impaired, limited or restricted  Leighton Roach, [place], [place] 01/12/2015, 10:53 AM

## 2015-01-12 NOTE — ED Notes (Addendum)
Receiving nurse requested medication for bp prior to arrival

## 2015-01-13 ENCOUNTER — Observation Stay (HOSPITAL_BASED_OUTPATIENT_CLINIC_OR_DEPARTMENT_OTHER): Payer: Medicare Other

## 2015-01-13 ENCOUNTER — Encounter (HOSPITAL_COMMUNITY): Payer: Self-pay | Admitting: *Deleted

## 2015-01-13 ENCOUNTER — Observation Stay (HOSPITAL_COMMUNITY): Payer: Medicare Other | Admitting: Anesthesiology

## 2015-01-13 ENCOUNTER — Encounter (HOSPITAL_COMMUNITY)
Admission: EM | Disposition: A | Discharge status: Home / Self Care | Payer: Self-pay | Source: Home / Self Care | Attending: Internal Medicine

## 2015-01-13 DIAGNOSIS — R142 Eructation: Secondary | ICD-10-CM | POA: Diagnosis not present

## 2015-01-13 DIAGNOSIS — R0600 Dyspnea, unspecified: Secondary | ICD-10-CM

## 2015-01-13 DIAGNOSIS — R7989 Other specified abnormal findings of blood chemistry: Secondary | ICD-10-CM | POA: Diagnosis not present

## 2015-01-13 DIAGNOSIS — I10 Essential (primary) hypertension: Secondary | ICD-10-CM | POA: Diagnosis not present

## 2015-01-13 DIAGNOSIS — Z8673 Personal history of transient ischemic attack (TIA), and cerebral infarction without residual deficits: Secondary | ICD-10-CM | POA: Diagnosis not present

## 2015-01-13 DIAGNOSIS — D649 Anemia, unspecified: Secondary | ICD-10-CM | POA: Diagnosis not present

## 2015-01-13 DIAGNOSIS — R112 Nausea with vomiting, unspecified: Secondary | ICD-10-CM

## 2015-01-13 DIAGNOSIS — Z880 Allergy status to penicillin: Secondary | ICD-10-CM | POA: Diagnosis not present

## 2015-01-13 DIAGNOSIS — R1110 Vomiting, unspecified: Secondary | ICD-10-CM | POA: Insufficient documentation

## 2015-01-13 DIAGNOSIS — R0601 Orthopnea: Secondary | ICD-10-CM | POA: Diagnosis not present

## 2015-01-13 DIAGNOSIS — Z79899 Other long term (current) drug therapy: Secondary | ICD-10-CM | POA: Diagnosis not present

## 2015-01-13 DIAGNOSIS — Z7901 Long term (current) use of anticoagulants: Secondary | ICD-10-CM | POA: Diagnosis not present

## 2015-01-13 DIAGNOSIS — R1013 Epigastric pain: Secondary | ICD-10-CM | POA: Diagnosis not present

## 2015-01-13 DIAGNOSIS — E871 Hypo-osmolality and hyponatremia: Secondary | ICD-10-CM | POA: Diagnosis not present

## 2015-01-13 DIAGNOSIS — R1011 Right upper quadrant pain: Secondary | ICD-10-CM | POA: Diagnosis not present

## 2015-01-13 DIAGNOSIS — K649 Unspecified hemorrhoids: Secondary | ICD-10-CM | POA: Diagnosis not present

## 2015-01-13 DIAGNOSIS — R531 Weakness: Secondary | ICD-10-CM | POA: Diagnosis not present

## 2015-01-13 DIAGNOSIS — E785 Hyperlipidemia, unspecified: Secondary | ICD-10-CM | POA: Diagnosis not present

## 2015-01-13 DIAGNOSIS — I480 Paroxysmal atrial fibrillation: Secondary | ICD-10-CM | POA: Diagnosis not present

## 2015-01-13 DIAGNOSIS — I493 Ventricular premature depolarization: Secondary | ICD-10-CM | POA: Diagnosis not present

## 2015-01-13 DIAGNOSIS — E039 Hypothyroidism, unspecified: Secondary | ICD-10-CM | POA: Diagnosis not present

## 2015-01-13 DIAGNOSIS — K8010 Calculus of gallbladder with chronic cholecystitis without obstruction: Secondary | ICD-10-CM | POA: Diagnosis not present

## 2015-01-13 DIAGNOSIS — K219 Gastro-esophageal reflux disease without esophagitis: Secondary | ICD-10-CM | POA: Diagnosis not present

## 2015-01-13 DIAGNOSIS — K449 Diaphragmatic hernia without obstruction or gangrene: Secondary | ICD-10-CM | POA: Diagnosis not present

## 2015-01-13 HISTORY — PX: ESOPHAGOGASTRODUODENOSCOPY: SHX5428

## 2015-01-13 LAB — CBC
HCT: 34.1 % — ABNORMAL LOW (ref 36.0–46.0)
HEMOGLOBIN: 11.4 g/dL — AB (ref 12.0–15.0)
MCH: 30.3 pg (ref 26.0–34.0)
MCHC: 33.4 g/dL (ref 30.0–36.0)
MCV: 90.7 fL (ref 78.0–100.0)
Platelets: 350 10*3/uL (ref 150–400)
RBC: 3.76 MIL/uL — AB (ref 3.87–5.11)
RDW: 13.3 % (ref 11.5–15.5)
WBC: 5.2 10*3/uL (ref 4.0–10.5)

## 2015-01-13 LAB — BASIC METABOLIC PANEL
ANION GAP: 9 (ref 5–15)
BUN: 7 mg/dL (ref 6–20)
CALCIUM: 8.3 mg/dL — AB (ref 8.9–10.3)
CHLORIDE: 92 mmol/L — AB (ref 101–111)
CO2: 26 mmol/L (ref 22–32)
Creatinine, Ser: 0.62 mg/dL (ref 0.44–1.00)
GFR calc non Af Amer: >60 mL/min (ref >60)
Glucose, Bld: 98 mg/dL (ref 65–99)
Potassium: 4.7 mmol/L (ref 3.5–5.1)
SODIUM: 127 mmol/L — AB (ref 135–145)

## 2015-01-13 LAB — PROTIME-INR
INR: 1.81 — ABNORMAL HIGH (ref 0.00–1.49)
Prothrombin Time: 20.9 seconds — ABNORMAL HIGH (ref 11.6–15.2)

## 2015-01-13 SURGERY — EGD (ESOPHAGOGASTRODUODENOSCOPY)
Anesthesia: Monitor Anesthesia Care

## 2015-01-13 MED ORDER — PHYTONADIONE 5 MG PO TABS
5.0000 mg | ORAL_TABLET | Freq: Once | ORAL | Status: AC
  Administered 2015-01-13: 5 mg via ORAL
  Filled 2015-01-13 (×2): qty 1

## 2015-01-13 MED ORDER — MEPERIDINE HCL 25 MG/ML IJ SOLN: 6.2500 mg | INTRAMUSCULAR | Status: DC | PRN

## 2015-01-13 MED ORDER — ONDANSETRON HCL 4 MG/2ML IJ SOLN
INTRAMUSCULAR | Status: DC | PRN
Start: 2015-01-13 — End: 2015-01-13
  Administered 2015-01-13: 4 mg via INTRAVENOUS

## 2015-01-13 MED ORDER — LACTATED RINGERS IV SOLN
INTRAVENOUS | Status: DC
  Administered 2015-01-13: 09:00:00 via INTRAVENOUS

## 2015-01-13 MED ORDER — PANTOPRAZOLE SODIUM 40 MG PO TBEC
40.0000 mg | DELAYED_RELEASE_TABLET | Freq: Every day | ORAL | Status: DC
  Administered 2015-01-14 – 2015-01-16 (×3): 40 mg via ORAL
  Filled 2015-01-13 (×4): qty 1

## 2015-01-13 MED ORDER — ONDANSETRON HCL 4 MG/2ML IJ SOLN: 4.0000 mg | Freq: Once | INTRAMUSCULAR | Status: DC | PRN

## 2015-01-13 MED ORDER — HYDROMORPHONE HCL 1 MG/ML IJ SOLN: 0.2500 mg | INTRAMUSCULAR | Status: DC | PRN

## 2015-01-13 MED ORDER — FENTANYL CITRATE (PF) 100 MCG/2ML IJ SOLN
INTRAMUSCULAR | Status: DC | PRN
  Administered 2015-01-13 (×2): 25 ug via INTRAVENOUS

## 2015-01-13 MED ORDER — PROPOFOL 500 MG/50ML IV EMUL
INTRAVENOUS | Status: DC | PRN
  Administered 2015-01-13: 75 ug/kg/min via INTRAVENOUS

## 2015-01-13 NOTE — Op Note (Signed)
[facility] [place], [id number]   ENDOSCOPY PROCEDURE REPORT  PATIENT: Terri, Rogers  MR#: [id number] BIRTHDATE: 12/12/1932 , 82  yrs. old GENDER: female ENDOSCOPIST: Jerene Bears, MD REFERRED BY:  [facility] PROCEDURE DATE:  01/13/2015 PROCEDURE:  EGD, diagnostic ASA CLASS:     Class III INDICATIONS:  epigastric pain, nausea, and vomiting. MEDICATIONS: Monitored anesthesia care and Per Anesthesia TOPICAL ANESTHETIC: none  DESCRIPTION OF PROCEDURE: After the risks benefits and alternatives of the procedure were thoroughly explained, informed consent was obtained.  The Pentax Gastroscope [id number] endoscope was introduced through the mouth and advanced to the second portion of the duodenum , Without limitations.  The instrument was slowly withdrawn as the mucosa was fully examined.   ESOPHAGUS: The mucosa of the esophagus appeared normal.   Z-line regular at 36 cm.  STOMACH: A 4 cm hiatal hernia was noted.   The mucosa of the stomach appeared normal.  Diverticulum in the fundus without inflammation or ulceration.  DUODENUM: The duodenal mucosa showed no abnormalities in the bulb and 2nd part of the duodenum. Retroflexed views revealed as previously described.     The scope was then withdrawn from the patient and the procedure completed.  COMPLICATIONS: There were no immediate complications.  ENDOSCOPIC IMPRESSION: 1.   The mucosa of the esophagus appeared normal 2.   4 cm hiatal hernia 3.   The mucosa of the stomach appeared normal 4.   The duodenal mucosa showed no abnormalities in the bulb and 2nd part of the duodenum  RECOMMENDATIONS: 1.  No source for epigastric abdominal pain with nausea and vomiting.  Suspect symptomatic cholelithiasis.  Surgical consult 2.  Once daily PPI sufficient 3.  GI will sign off, call with questions  eSigned:  Jerene Bears, MD 01/13/2015 9:49 AM    CC: the patient  PATIENT NAME:   Terri, Rogers MR#: [id number]

## 2015-01-13 NOTE — Progress Notes (Signed)
Patient Name: [name] Date of Encounter: 01/13/2015  Principal Problem:   Epigastric pain Active Problems:   UNSPECIFIED ANEMIA   Atrial fibrillation (HCC)   Hypothyroidism   HTN (hypertension)   Elevated troponin   Hyponatremia   Rectal bleeding   Epigastric abdominal pain   External hemorrhoid, bleeding   Primary Cardiologist: Dr Ron Parker, Dr Caryl Comes  Patient Profile: 32 woman with PAF, PVC, on Flecainide and Atenolol, warfarin anticoag, nl EF by echo 2014, diverticulosis, hemorrhoids, HTN, Hypothyroid, CVA, GERD. Admitted 01/07 for hematochezia. Cards consulted for trop 0.12 and PAF hx. If Echo nl, MV planned.   SUBJECTIVE: No more bleeding, pt says was just her hemorrhoids, no chest pain or SOB, no palpitations, no awareness of low HR  OBJECTIVE Filed Vitals:   01/12/15 1430 01/12/15 2018 01/13/15 0029 01/13/15 0446  BP: 174/62 149/59 174/61 153/63  Pulse: 62 62 62 61  Temp: 97.8 F (36.6 C) 98.1 F (36.7 C)  97.9 F (36.6 C)  TempSrc: Oral Oral  Oral  Resp: 18 18 18 18   Height:      Weight:    139 lb 9.6 oz (63.322 kg)  SpO2: 93% 94% 96% 98%    Intake/Output Summary (Last 24 hours) at 01/13/15 0813 Last data filed at 01/12/15 2107  Gross per 24 hour  Intake    880 ml  Output    301 ml  Net    579 ml   Filed Weights   01/11/15 2046 01/12/15 0031 01/13/15 0446  Weight: 145 lb (65.772 kg) 141 lb 8 oz (64.184 kg) 139 lb 9.6 oz (63.322 kg)    PHYSICAL EXAM General: Well developed, well nourished, female in no acute distress. Head: Normocephalic, atraumatic.  Neck: Supple without bruits, JVD not elevation. Lungs:  Resp regular and unlabored, some dry rales, especially in the bases. Heart: RRR, S1, S2, no S3, S4, soft murmur; no rub. Abdomen: Soft, non-tender, non-distended, BS + x 4.  Extremities: No clubbing, cyanosis, edema.  Neuro: Alert and oriented X 3. Moves all extremities spontaneously. Psych: Normal affect.  LABS: CBC: Recent Labs  01/12/15 0710 01/13/15 0405  WBC 5.8 5.2  HGB 11.3* 11.4*  HCT 34.0* 34.1*  MCV 91.2 90.7  PLT 321 350   INR: Recent Labs  01/13/15 0405  INR [id number]*   Basic Metabolic Panel: Recent Labs  01/12/15 0710 01/13/15 0405  NA 127* 127*  K 4.0 4.7  CL 91* 92*  CO2 26 26  GLUCOSE 111* 98  BUN <5* 7  CREATININE 0.56 0.62  CALCIUM 8.3* 8.3*   Liver Function Tests: Recent Labs  01/11/15 2101 01/12/15 0710  AST 25 24  ALT 19 16  ALKPHOS 64 59  BILITOT 0.2* 0.4  PROT 6.7 6.3*  ALBUMIN 3.2* 2.9*   Cardiac Enzymes: Recent Labs  01/12/15 0122 01/12/15 0710 01/12/15 1430  TROPONINI 0.12* 0.07* 0.05*    Recent Labs  01/11/15 2113 01/12/15 0013  TROPIPOC 0.09* 0.15*   BNP:  B NATRIURETIC PEPTIDE  Date/Time Value Ref Range Status  01/12/2015 01:22 AM 273.1* 0.0 - 100.0 pg/mL Final   TELE:  SR, Sbrady w/ freqent PVCs     Radiology/Studies: Dg Chest 2 View 01/11/2015  CLINICAL DATA:  Acute onset of epigastric abdominal pain and vomiting. Generalized weakness. Subacute onset of cough, and acute rectal bleeding. Initial encounter. EXAM: CHEST  2 VIEW COMPARISON:  Chest radiograph and CTA of the chest performed 12/30/2014 FINDINGS: The lungs are well-aerated. Mild  vascular congestion is noted, with mild bibasilar atelectasis. There is no evidence of pleural effusion or pneumothorax. The heart is borderline normal in size. No acute osseous abnormalities are seen. IMPRESSION: Mild vascular congestion noted, with mild bibasilar atelectasis. Electronically Signed   By: Garald Balding M.D.   On: 01/11/2015 22:09   US Abdomen Limited Ruq 01/11/2015  CLINICAL DATA:  Right upper quadrant pain for 1 day. Associated nausea and vomiting. EXAM: US ABDOMEN LIMITED - RIGHT UPPER QUADRANT COMPARISON:  CT of the abdomen pelvis dated 08/04/2011 FINDINGS: Gallbladder: There are 2 mobile gallstones measuring less than 1.5 cm. No gallbladder with wall thickening visualized, gallbladder wall measures  2.7 mm. No sonographic Murphy sign noted by sonographer. Common bile duct: Diameter: 5.3 mm Liver: No focal lesion identified. Within normal limits in parenchymal echogenicity. IMPRESSION: Cholelithiasis without sonographic evidence of acute cholecystitis. Electronically Signed   By: Fidela Salisbury M.D.   On: 01/11/2015 22:46     Current Medications:  . atenolol  25 mg Oral Daily  . cefUROXime  500 mg Oral BID  . flecainide  100 mg Oral BID  . furosemide  20 mg Oral Daily  . gabapentin  100 mg Oral q morning - 10a  . hydrocortisone  25 mg Rectal BID  . levothyroxine  50 mcg Oral QAC breakfast  . lisinopril  10 mg Oral Daily  . pantoprazole  40 mg Oral BID AC  . potassium chloride SA  20 mEq Oral Daily  . sodium chloride  3 mL Intravenous Q12H  . Warfarin - Pharmacist Dosing Inpatient   Does not apply q1800   . sodium chloride 20 mL/hr at 01/12/15 2107    ASSESSMENT AND PLAN: Principal Problem:   Epigastric pain - hematochezia PTA, pt says she has hemorrhoids - No more N&V, no abd/epigastric pain - per IM/GI - for EGD today - coumadin on hold    Atrial fibrillation (HCC) - paroxysmal - none seen on telemetry review - HR 40s at times, pt asymptomatic - no dose changes    Elevated troponin - peak 0.12 - no chest pain - EF 2014 and MV 2010 normal - for echo today - if abnl, cath, if EF nl and no WMA, MV ?as OP    Chronic anticoagulation - on coumadin PTA - now on hold - resume when OK w/ GI   Otherwise, per IM Active Problems:   UNSPECIFIED ANEMIA   Hypothyroidism   HTN (hypertension)   Hyponatremia   Rectal bleeding   Epigastric abdominal pain   External hemorrhoid, bleeding   Signed, Barrett, Rhonda , PA-C 8:13 AM 01/13/2015 Echocardiogram shows normal systolic function and EF [id number] and no wall motion abnormalities. EKGs were personally reviewed and show anterior T wave inversion which may represent post-tachycardia T wave abnormalites. I suspect that  the elevation in troponin was secondary to her tachycardia. Since she is being considered for cholecystectomy we will do lexiscan myoview here in the hospital rather than as an outpatient.

## 2015-01-13 NOTE — Transfer of Care (Signed)
Immediate Anesthesia Transfer of Care Note  Patient: [name]  Procedure(s) Performed: Procedure(s): ESOPHAGOGASTRODUODENOSCOPY (EGD) (N/A)  Patient Location: Endoscopy Unit  Anesthesia Type:MAC  Level of Consciousness: awake, alert  and oriented  Airway & Oxygen Therapy: Patient Spontanous Breathing and Patient connected to nasal cannula oxygen  Post-op Assessment: Report given to RN, Post -op Vital signs reviewed and stable and Patient moving all extremities  Post vital signs: Reviewed and stable  Last Vitals:  Filed Vitals:   01/13/15 0446 01/13/15 0817  BP: 153/63 188/63  Pulse: 61 64  Temp: 36.6 C 36.4 C  Resp: 18 13    Complications: No apparent anesthesia complications

## 2015-01-13 NOTE — H&P (View-Only) (Signed)
[facility] Consult: 10:58 AM 01/12/2015  LOS: 1 day    Referring Provider: Dr Tana Coast  Primary Care Physician:  Kenn File, MD Primary Gastroenterologist:  Dr. Delfin Edis    Reason for Consultation:  Epigastric/chest pain.    HPI: [name] is a 82 y.o. female.  PMH parox afib, on coumadin and flecainide.  Osteo arthritis/DJD s/p knee replacements and shoulder replacement.  History TIA Gastric diverticulum per CT of 07/2011.  02/2010 EGD.  For dyspepsia and anemia.  Normal EGD, small bowel biopsies: benign, no villous atrophy.  02/2010 Colonoscopy. Internal hemorrhoids.  Moderate diverticulosis.  Sessile polyp in the sigmoid colon (path: inflammatory polyp)  07/1999 Colonoscopy for rectal bleeding and hx colon cancer in brother. Mild left and right diverticulosis, internal hemorrhoids.   07/2011 celiac profile labs wnl.    For at least a couple of years, patient has been battling dyspeptic symptoms with pyrosis, epigastric discomfort and lots of belching. symptoms significantly worse when she lays down at night so she would take Tums at bedtime. Symptoms did not awaken her at night and were not as bothersome during the day.. She had these nocturnal symptoms several times every week. She had been prescribed PPI therapy but was not taking this regularly.  On Christmas Day the patient ate a lot more fatty foods than is normal for her. The following day she had severe worsening of the epigastric pain it was radiating up into her chest and shoulders.  Seen in [facility] ED 12/26 where she ruled out for ACS and labs unrevealing (normal LFTs and lipase) though Na level lower at 128.     12/30/14 CT angio chest. 2 mm RUL nodules x 2.  Mild CM.  Mild calcific atherosclerosis of aortic arch.   She was treated with a GI  cocktail, aspirin, nitroglycerin paste, IV Pepcid, fentanyl.  The symptoms subsided during the course of her ED visit. She believes the sxs lasted maybe a couple of hours altogether. Patient was prescribed omeprazole twice daily along with scheduled doses of Maalox. She was compliant with both although she did run out of Maalox at times and substituted this with Pepto-Bismol. The meds reduced the incidences of belching but the pain persisted.   On 1/6 she developed nausea and nonbloody emesis, some of which contained partially digested recently eaten food. Patient saw some blood in her otherwise brown, formed stool; which is not unusual for her. She usually manages her rectal bleeding by using Preparation H suppositories. The pain further intensified and radiated up into the axilla so she presented back to the emergency room. Labs:  troponin I of 0.09, 0.15, 0.12, 0.07. normal LFTs and Lipase, albumin now 2.9 c/w 4.2 two weeks ago  coags therapeutic.  FOBT positive.  01/11/15 2 view CXR: mild BB atx and mild vascular congestion.  01/11/15 abd ultrasound: Cholelithiasis, but no cholecystitis. 5.3 mm CBD.  Normal liver  Cardiologist does not feel that patient is experiencing ACS, there are no ECG findings suggestive of acute ischemia.  Patient's home med list sites  Fosamax weekly, the patient says she never started taking this. She used to take Celebrex but stopped this a couple of months ago. She doesn't take aspirin or other NSAIDs at home. Patient says that she has to break up large pills in order to swallow them but generally does not have significant dysphagia to solids.    Past Medical History  Diagnosis Date  . Atrial fibrillation (HCC)     Paroxysmal, Flecainide therapy  . Warfarin anticoagulation   . Hypothyroidism   . Carotid bruit     Doppler, December, 2009, no abnormality  . Cardiomegaly     EF normal, echo, 2003  //   EF 60-65%, echo, June 18, 123456, diastolic dysfunction,  . Normal  nuclear stress test     Normal, 2001  . HLD (hyperlipidemia)   . HTN (hypertension)   . Osteoarthritis   . Calf pain     September, 2012, at rest  . PONV (postoperative nausea and vomiting)   . GERD (gastroesophageal reflux disease)   . Anemia   . Rectal bleeding 2001    diverticulosis and int hemorrhoids on 07/1999 and 02/2010 colonoscopies.  . Hypothyroidism   . Cataract   . Stroke ([place])   . Insomnia   . History of shingles     Past Surgical History  Procedure Laterality Date  . Total knee arthroplasty  05/26/10    right  . Total shoulder replacement  01/2010    left  . Shoulder open rotator cuff repair      bilateral  . Total knee arthroplasty  11/17/2010    Procedure: TOTAL KNEE ARTHROPLASTY;  Surgeon: Mauri Pole;  Location: [facility];  Service: Orthopedics;  Laterality: Left;  . Total knee arthroplasty      Right  . Cataract extraction w/phaco Right 04/15/2014    Procedure: CATARACT EXTRACTION PHACO AND INTRAOCULAR LENS PLACEMENT (IOC);  Surgeon: Tonny Branch, MD;  Location: [facility];  Service: Ophthalmology;  Laterality: Right;  CDE: 13.30  . Cataract extraction w/phaco Left 05/13/2014    Procedure: CATARACT EXTRACTION PHACO AND INTRAOCULAR LENS PLACEMENT LEFT EYE;  Surgeon: Tonny Branch, MD;  Location: [facility];  Service: Ophthalmology;  Laterality: Left;  CDE:13.00    Prior to Admission medications   Medication Sig Start Date End Date Taking? Authorizing Provider  acetaminophen (TYLENOL) 500 MG tablet Take 500 mg by mouth every 6 (six) hours as needed for moderate pain.   Yes Historical Provider, MD  alendronate (FOSAMAX) 70 MG tablet Take 1 tablet (70 mg total) by mouth once a week. Take with a full glass of water on an empty stomach. 12/16/14  Yes Tammy Eckard, PHARMD  atenolol (TENORMIN) 25 MG tablet Take 25 mg by mouth daily.    Yes Historical Provider, MD  cefdinir (OMNICEF) 300 MG capsule Take 1 capsule (300 mg total) by mouth 2 (two) times daily. 1 po BID 01/07/15  Yes Timmothy Euler, MD  dicyclomine (BENTYL) 10 MG capsule Take 10 mg by mouth daily as needed (upset stomach).   Yes Historical Provider, MD  ergocalciferol (VITAMIN D2) 50000 UNITS capsule Take 50,000 Units by mouth once a week. Pt takes on sunday   Yes Historical Provider, MD  flecainide (TAMBOCOR) 100 MG tablet Take 100 mg by mouth 2 (two) times daily.    Yes Historical Provider, MD  fluticasone (FLONASE) 50 MCG/ACT nasal spray Use 2 sprays in each  nostril once daily 11/01/14  Yes Historical Provider, MD  furosemide (LASIX) 40  MG tablet Take 0.5-1 tablets (20-40 mg total) by mouth every morning. 11/25/14  Yes Timmothy Euler, MD  gabapentin (NEURONTIN) 100 MG capsule Take 1 tab in the morning Patient taking differently: Take 100 mg by mouth every morning. Take 1 tab in the morning 12/17/14  Yes Sharion Balloon, FNP  GARLIC PO Take 1 tablet by mouth daily.   Yes Historical Provider, MD  levothyroxine (SYNTHROID, LEVOTHROID) 50 MCG tablet Take 50 mcg by mouth every morning.    Yes Historical Provider, MD  meclizine (ANTIVERT) 25 MG tablet Take 25 mg by mouth daily as needed. Inner ear   Yes Historical Provider, MD  mupirocin ointment (BACTROBAN) 2 % Place 1 application into the nose 2 (two) times daily. 11/25/14  Yes Timmothy Euler, MD  omeprazole (PRILOSEC) 20 MG capsule Take 1 po BID x 2 weeks then once a day Patient taking differently: Take 20 mg by mouth 2 (two) times daily before a meal. Take 1 po BID x 2 weeks then once a day 12/30/14  Yes Rolland Porter, MD  potassium chloride SA (K-DUR,KLOR-CON) 20 MEQ tablet Take 20 mEq by mouth daily.  10/19/12  Yes Historical Provider, MD  traMADol (ULTRAM) 50 MG tablet Take 1 tablet (50 mg total) by mouth every 8 (eight) hours as needed. 12/17/14  Yes Sharion Balloon, FNP  vitamin B-12 (CYANOCOBALAMIN) 500 MCG tablet Take 500 mcg by mouth daily.    Yes Historical Provider, MD  warfarin (COUMADIN) 5 MG tablet Take 5-7.5 mg by mouth every morning. 7.5mg  on  Wednesday and Saturday and 5 mg all other days.   Yes Historical Provider, MD  zolpidem (AMBIEN) 10 MG tablet Take 0.5-1 tablets (5-10 mg total) by mouth at bedtime as needed for sleep. Take 1/2 to 1 tablet at bedtime as needed 12/17/14  Yes Sharion Balloon, FNP    Scheduled Meds: . atenolol  25 mg Oral Daily  . cefUROXime  500 mg Oral BID  . flecainide  100 mg Oral BID  . furosemide  20 mg Oral Daily  . gabapentin  100 mg Oral q morning - 10a  . levothyroxine  50 mcg Oral QAC breakfast  . lisinopril  10 mg Oral Daily  . pantoprazole  40 mg Oral BID AC  . potassium chloride SA  20 mEq Oral Daily  . sodium chloride  3 mL Intravenous Q12H   Infusions:   PRN Meds: acetaminophen **OR** acetaminophen, alum & mag hydroxide-simeth, calcium carbonate, ondansetron **OR** ondansetron (ZOFRAN) IV, traMADol, zolpidem   Allergies as of 01/11/2015 - Review Complete 01/11/2015  Allergen Reaction Noted  . Penicillins Rash     Family History  Problem Relation Age of Onset  . COPD Brother   . Atrial fibrillation Brother   . Diabetes Brother   . Liver cancer Brother     Alcohol-related  . Colon cancer Brother   . Heart attack Father   . Rheum arthritis Mother   . Arthritis Mother   . Heart disease Mother   . Heart attack Daughter   . Coronary artery disease Brother   . Gallbladder disease Brother     Social History   Social History  . Marital Status: Married    Spouse Name: N/A  . Number of Children: N/A  . Years of Education: N/A   Occupational History  . Not on file.   Social History Main Topics  . Smoking status: Never Smoker   . Smokeless tobacco: Never Used  .  Alcohol Use: No  . Drug Use: No  . Sexual Activity: No   Other Topics Concern  . Not on file   Social History Narrative    REVIEW OF SYSTEMS: Constitutional:   Patient's weight reflects a 9 pound drop compared with 12/30/14 so over about a 2 week period ENT:  No nose bleeds Pulm:  no cough, no DOE. CV:   No palpitations, no LE edema.  GU:  No hematuria, no frequency GI:  Per HPI Heme:   No unusual bleeding or bruising. No history of requiring iron supplementation or of significant anemia.   Transfusions:   Patient recalls no history of blood transfusions. Neuro:   Patient's balance and gait are somewhat compromised. She has occasional dizziness. She is taken falls at home on more than a few occasions in the last few months. She says most of the time she trips and its not the dizziness that causes her to fall.No headaches, no peripheral tingling or numbness Derm:  No itching, no rash or sores.  Endocrine:  No sweats or chills.  No polyuria or dysuria Immunization:   Up-to-date on flu and pneumococcal vaccination Travel:  None beyond local counties in last few months.    PHYSICAL EXAM: Vital signs in last 24 hours: Filed Vitals:   01/12/15 0328 01/12/15 0838  BP: 165/65 155/69  Pulse: 78 76  Temp: 98.3 F (36.8 C) 98 F (36.7 C)  Resp: 18 18   Wt Readings from Last 3 Encounters:  01/12/15 64.184 kg (141 lb 8 oz)  01/07/15 66.407 kg (146 lb 6.4 oz)  12/30/14 68.04 kg (150 lb)   General:  pleasant, somewhat frail-appearing, comfortable elderly WF Head:   No signs of trauma, no asymmetry or facial swelling.  Eyes:   No scleral icterus or conjunctival pallor. EOMI. Ears:   Slightly HOH.  Nose:   No discharge or congestion Mouth:   Clear, moist oral mucosa. Neck:   No JVD, no masses, no bruits, no TMG Lungs:   Clear bilaterally to auscultation and percussion. No labored breathing. No cough. Heart:  RRR. No MRG. S1/S2 audible. Abdomen:   Soft. Slight epigastric tenderness no right upper quadrant tenderness.. Not distended.  Bowel sounds active..   Rectal:  palpable internal hemorrhoids. Flexible sigmoidoscopy of blood on exam glove tests FOBT positive.   Musc/Skeltl:  postsurgical changes in the knees and shoulders. No swelling. Arthritic deformity in the fingers and  knees. Extremities:   No CCE.  Neurologic:   Patient is alert. Oriented 3. No tremor. Moves all 4 limbs, limb strength not tested. Skin:   No rash, no telangiectasia, no suspicious lesions. Tattoos:   None Nodes:   No cervical adenopathy.   Psych:   Pleasant, cooperative, relaxed.  Intake/Output from previous day: 01/07 0701 - 01/08 0700 In: 462 [P.O.:462] Out: 375 [Urine:375] Intake/Output this shift: Total I/O In: 280 [P.O.:280] Out: 301 [Urine:300; Stool:1]  LAB RESULTS:  Recent Labs  01/11/15 2101 01/12/15 0710  WBC 6.6 5.8  HGB 11.9* 11.3*  HCT 35.6* 34.0*  PLT 353 321   BMET Lab Results  Component Value Date   NA 127* 01/12/2015   NA 128* 01/11/2015   NA 135 12/30/2014   K 4.0 01/12/2015   K 4.0 01/11/2015   K 3.6 12/30/2014   CL 91* 01/12/2015   CL 90* 01/11/2015   CL 101 12/30/2014   CO2 26 01/12/2015   CO2 27 01/11/2015   CO2 24 12/30/2014   GLUCOSE 111*  01/12/2015   GLUCOSE 133* 01/11/2015   GLUCOSE 110* 12/30/2014   BUN <5* 01/12/2015   BUN 7 01/11/2015   BUN 16 12/30/2014   CREATININE 0.56 01/12/2015   CREATININE 0.68 01/11/2015   CREATININE 0.67 12/30/2014   CALCIUM 8.3* 01/12/2015   CALCIUM 8.5* 01/11/2015   CALCIUM 8.8* 12/30/2014   LFT  Recent Labs  01/11/15 2101 01/12/15 0710  PROT 6.7 6.3*  ALBUMIN 3.2* 2.9*  AST 25 24  ALT 19 16  ALKPHOS 64 59  BILITOT 0.2* 0.4   PT/INR Lab Results  Component Value Date   INR 2.58* 01/12/2015   INR 3.09* 01/11/2015   INR 2.8 12/31/2014   Hepatitis Panel No results for input(s): HEPBSAG, HCVAB, HEPAIGM, HEPBIGM in the last 72 hours. C-Diff No components found for: CDIFF Lipase     Component Value Date/Time   LIPASE 26 01/11/2015 2101    Drugs of Abuse  No results found for: LABOPIA, COCAINSCRNUR, LABBENZ, AMPHETMU, THCU, LABBARB   RADIOLOGY STUDIES: Dg Chest 2 View  01/11/2015  CLINICAL DATA:  Acute onset of epigastric abdominal pain and vomiting. Generalized weakness.  Subacute onset of cough, and acute rectal bleeding. Initial encounter. EXAM: CHEST  2 VIEW COMPARISON:  Chest radiograph and CTA of the chest performed 12/30/2014 FINDINGS: The lungs are well-aerated. Mild vascular congestion is noted, with mild bibasilar atelectasis. There is no evidence of pleural effusion or pneumothorax. The heart is borderline normal in size. No acute osseous abnormalities are seen. IMPRESSION: Mild vascular congestion noted, with mild bibasilar atelectasis. Electronically Signed   By: Garald Balding M.D.   On: 01/11/2015 22:09   US Abdomen Limited Ruq  01/11/2015  CLINICAL DATA:  Right upper quadrant pain for 1 day. Associated nausea and vomiting. EXAM: US ABDOMEN LIMITED - RIGHT UPPER QUADRANT COMPARISON:  CT of the abdomen pelvis dated 08/04/2011 FINDINGS: Gallbladder: There are 2 mobile gallstones measuring less than 1.5 cm. No gallbladder with wall thickening visualized, gallbladder wall measures 2.7 mm. No sonographic Murphy sign noted by sonographer. Common bile duct: Diameter: 5.3 mm Liver: No focal lesion identified. Within normal limits in parenchymal echogenicity. IMPRESSION: Cholelithiasis without sonographic evidence of acute cholecystitis. Electronically Signed   By: Fidela Salisbury M.D.   On: 01/11/2015 22:46    ENDOSCOPIC STUDIES: Per HPI.    IMPRESSION:   *  Radiating upper abdominal discomfort. Long-standing history of similar but less severe symptoms going on at least 2 years.  symptoms worsen 2 weeks ago and appears to be associated with more than normal intake of fatty foods.  Cholelithiasis but no cholecystitis on ultrasound. Repeated LFTs and lipase levels normal. 2012 EGD for dyspepsia with normal mucosa and normal small bowel biopsies. After initiating twice daily omeprazole 2 weeks ago, eructation improved but pain persists and n/v developed 48 hours PTA.   Rule out biliary colic, rule out peptic ulcer disease/GERD.   *   chronic Coumadin for  history of paroxysmal A. Fib.  Last dose of warfarin was on 01/10/15  *  Intermittent rectal bleeding, long-standing.  Int rrhoids on previous colonoscopies and on today's exam.   *  Hyponatremia.   PLAN:     *  Scheduled her for upper endoscopy tomorrow. If EGD findings do not explain the patient's symptoms, we will obtain general surgical consult for evaluation of biliary colic and for their opinion regarding laparoscopic cholecystectomy.  *  Okay to eat heart healthy diet today.  *  Continue bid oral protonix.   *  Added anusol HC suppositories.   *  No need to hold Coumadin pre EGD   Azucena Freed  01/12/2015, 10:58 AM Pager: [phone]  GI ATTENDING  History, laboratories, x-rays, prior endoscopy reports personally reviewed. Patient personally seen and examined. Patient's husband and sister in room. Agree with comprehensive consultation note as outlined above. Patient presents with severe epigastric pain with radiation into her chest and back. Second such episode in 2 weeks. Cardiac cause ruled out. She does have dyspeptic symptoms for which PPI was started. She is occasional NSAIDs. Ultrasound shows cholelithiasis without cholecystitis or ductal dilation. Liver tests normal. Pain generally abates within a few hours. I suspect that her recurrent pain as described as related to symptomatic cholelithiasis without cholecystitis or choledocholithiasis. We will plan diagnostic upper endoscopy (on Coumadin with Dr. Hilarie Fredrickson) to rule out other causes. The nature of the procedure, as well as the risks, benefits, and alternatives were carefully and thoroughly reviewed with the patient. Ample time for discussion and questions allowed. The patient understood, was satisfied, and agreed to proceed.Agree with empiric twice a day PPI to remove additional variables. If upper endoscopy unremarkable would recommend surgical consultation for probable symptomatic cholelithiasis leading to recurrent  hospitalizations.  Docia Chuck. Geri Seminole., M.D. [facility] Division of Gastroenterology

## 2015-01-13 NOTE — Progress Notes (Signed)
[facility]                                                                              Patient Demographics  [name], is a 82 y.o. female, DOB - January 28, 1932, [id number]  Admit date - 01/11/2015   Admitting Physician Edwin Dada, MD  Outpatient Primary MD for the patient is Kenn File, MD  LOS - 2   Chief Complaint  Patient presents with  . Rectal Bleeding  . Weakness       Brief HPI   [name] is a 82 y.o. female with a past medical history significant for pAF on flecainide and warfarin, HTN, hypothyroidism, and GERD presented with worsening epigastric pain for two weeks. Patient reported off-and-on epigastric pain,  reflux symptoms, over the last 6 months, now worse over the last 2 weeks.  2 weeks ago, patient was seen in ER at [facility] on 12/26, where she had 2 hour troponin rule out, negative CT angiogram of the chest (without PE or dissection), and was discharged with plans for follow up RUQ [place]. The night before the admission, patient noticed blood in the stool, has history of bleeding prolapsed hemorrhoids. Right upper quadrant ultrasound showed gallbladder stones without inflammation. TRH was asked to evaluate for admission for elevated troponin and epigastric pain. Patient also reported generalized weakness and exercise intolerance, has chronic orthopnea and peripheral edema.  Assessment & Plan    Principal Problem:  Epigastric discomfort, atypical chest pain with GI symptoms, slightly positive troponin - Suspect severe GERD versus angina equivalent, biliary colic  - Troponins slightly positive, cardiology following, 2-D echo done, results pending - Placed on PPI - GI consulted, EGD today normal, recommended surgery for symptomatic cholelithiasis - Surgery planning cholecystectomy once INR less than 1.5, will give vitamin K 5 mg 1   Weakness, progressive/orthopnea, fluid overload:  - Follow 2-D echocardiogram,  EF  Hyponatremia:  - BNP elevated 273, serum osmolarity 263, urinalysis 323 - Appears to have some fluid overload rather than dehydration, continue Lasix and follow BMET  Positive FOBT: Possibly from hemorrhoids  Paroxysmal atrial fibrillation Stable. CHADS2Vasc 4. Currently sinus with frequent PVCs. -Continue atenolol and flecainide. - Coumadin currently on hold for cholecystectomy   Hypothyroidism:  -Continue levothyroxine.   Hypertension:  -Continue atenolol and furosemide -Add lisinopril 10 mg daily   Code Status: Full CODE STATUS  Family Communication: Discussed in detail with the patient, all imaging results, lab results explained to the patient    Disposition Plan:   Time Spent in minutes  25 minutes  Procedures  None  Consults   GI Cardiology  DVT Prophylaxis warfarinOn hold   Scheduled Meds: . atenolol  25 mg Oral Daily  . cefUROXime  500 mg Oral BID  . flecainide  100 mg Oral BID  . furosemide  20 mg Oral Daily  . gabapentin  100 mg Oral q morning - 10a  . hydrocortisone  25 mg Rectal BID  . levothyroxine  50 mcg Oral QAC breakfast  . lisinopril  10 mg Oral Daily  . [START ON 01/14/2015] pantoprazole  40 mg Oral QAC breakfast  .  potassium chloride SA  20 mEq Oral Daily  . sodium chloride  3 mL Intravenous Q12H  . Warfarin - Pharmacist Dosing Inpatient   Does not apply q1800   Continuous Infusions:  PRN Meds:.acetaminophen **OR** acetaminophen, alum & mag hydroxide-simeth, calcium carbonate, HYDROmorphone (DILAUDID) injection, meperidine (DEMEROL) injection, ondansetron **OR** ondansetron (ZOFRAN) IV, ondansetron (ZOFRAN) IV, traMADol, zolpidem   Antibiotics   Anti-infectives    Start     Dose/Rate Route Frequency Ordered Stop   01/12/15 1000  cefUROXime (CEFTIN) tablet 500 mg     500 mg Oral 2 times daily 01/12/15 0051          Subjective:   [name] was seen and examined today.  currently denying any complaints, no chest pain or  shortness of breath.  currently tolerating clear liquid diet.  Patient denies dizziness, chest pain, shortness of breath, abdominal pain, new weakness, numbess, tingling. No acute events overnight.    Objective:   Blood pressure 144/59, pulse 54, temperature 98.2 F (36.8 C), temperature source Oral, resp. rate 16, height 5\' 3"  (1.6 m), weight 63.322 kg (139 lb 9.6 oz), SpO2 95 %.  Wt Readings from Last 3 Encounters:  01/13/15 63.322 kg (139 lb 9.6 oz)  01/07/15 66.407 kg (146 lb 6.4 oz)  12/30/14 68.04 kg (150 lb)     Intake/Output Summary (Last 24 hours) at 01/13/15 1311 Last data filed at 01/13/15 1235  Gross per 24 hour  Intake    680 ml  Output      1 ml  Net    679 ml    Exam  General: Alert and oriented x 3, NAD  HEENT:  PERRLA, EOMI  Neck: Supple, no JVD  CVS: S1 S2clear, RRR  Respiratory: CTAB  Abdomen: Soft, slight epigastric tenderness, RUQ TTP, nondistended, + bowel sounds  Ext: no cyanosis clubbing or edema  Neuro: no deficits   Skin: No rashes  Psych: Normal affect and demeanor, alert and oriented x3    Data Review   Micro Results No results found for this or any previous visit (from the past 240 hour(s)).  Radiology Reports Dg Chest 2 View  01/11/2015  CLINICAL DATA:  Acute onset of epigastric abdominal pain and vomiting. Generalized weakness. Subacute onset of cough, and acute rectal bleeding. Initial encounter. EXAM: CHEST  2 VIEW COMPARISON:  Chest radiograph and CTA of the chest performed 12/30/2014 FINDINGS: The lungs are well-aerated. Mild vascular congestion is noted, with mild bibasilar atelectasis. There is no evidence of pleural effusion or pneumothorax. The heart is borderline normal in size. No acute osseous abnormalities are seen. IMPRESSION: Mild vascular congestion noted, with mild bibasilar atelectasis. Electronically Signed   By: Garald Balding M.D.   On: 01/11/2015 22:09   Dg Chest 2 View  12/30/2014  CLINICAL DATA:  Acute onset  of central chest pain, radiating to the shoulders. Initial encounter. EXAM: CHEST  2 VIEW COMPARISON:  Chest radiograph performed 08/10/2013 FINDINGS: The lungs are well-aerated. A small calcified granuloma is suggested at the right midlung zone. There is no evidence of focal opacification, pleural effusion or pneumothorax. The heart is normal in size; the mediastinal contour is within normal limits. No acute osseous abnormalities are seen. A left shoulder arthroplasty is partially imaged. IMPRESSION: No acute cardiopulmonary process seen. Electronically Signed   By: Garald Balding M.D.   On: 12/30/2014 02:08   Ct Angio Chest Aorta W/cm &/or Wo/cm  12/30/2014  CLINICAL DATA:  Central chest pain and pressure radiating  to shoulders. History of atrial fibrillation, into coagulation, hypertension, hyperlipidemia. EXAM: CT ANGIOGRAPHY CHEST WITH CONTRAST TECHNIQUE: Multidetector CT imaging of the chest was performed using the standard protocol during bolus administration of intravenous contrast. Multiplanar CT image reconstructions and MIPs were obtained to evaluate the vascular anatomy. CONTRAST:  160mL OMNIPAQUE IOHEXOL 350 MG/ML SOLN COMPARISON:  Chest radiograph December 30, 2014 FINDINGS: No abnormal density along the course of the thoracic aorta on precontrast imaging. Thoracic aorta is normal in course caliber. Mild calcific atherosclerosis of the aortic arch. No dissection, luminal irregularity, aneurysm, periaortic fluid collection or contrast extravasation. Though not tailored for evaluation, no pulmonary embolism. No lymphadenopathy by CT size criteria. The heart is mildly enlarged. Minimal coronary artery calcifications. No pericardial effusions. 2 mm RIGHT upper lobe, 2 mm RIGHT upper lobe pulmonary nodule. No pleural effusion or focal consolidation. No pleural effusion. Dependent atelectasis. Tracheobronchial tree is patent and midline. No pneumothorax. Mildly elevated RIGHT hemidiaphragm. 13 mm  low-density lesion in the spleen may represent lymphangioma or cyst. Thickened adrenal glands without discrete nodule. 2.2 cm RIGHT upper pole renal cyst. Status post LEFT shoulder arthroplasty. Minimal degenerative change, less than expected for age. IMPRESSION: Mild cardiomegaly.  No acute pulmonary process. Mild calcific atherosclerosis of the aortic arch, no acute vascular process. Two 2 mm RIGHT upper lobe pulmonary nodules. If the patient is at high risk for bronchogenic carcinoma, follow-up chest CT at 1 year is recommended. If the patient is at low risk, no follow-up is needed. This recommendation follows the consensus statement: Guidelines for Management of Small Pulmonary Nodules Detected on CT Scans: A Statement from the [place] as published in Radiology 2005; 237:395-400. Electronically Signed   By: Elon Alas M.D.   On: 12/30/2014 05:39   US Abdomen Limited Ruq  01/11/2015  CLINICAL DATA:  Right upper quadrant pain for 1 day. Associated nausea and vomiting. EXAM: US ABDOMEN LIMITED - RIGHT UPPER QUADRANT COMPARISON:  CT of the abdomen pelvis dated 08/04/2011 FINDINGS: Gallbladder: There are 2 mobile gallstones measuring less than 1.5 cm. No gallbladder with wall thickening visualized, gallbladder wall measures 2.7 mm. No sonographic Murphy sign noted by sonographer. Common bile duct: Diameter: 5.3 mm Liver: No focal lesion identified. Within normal limits in parenchymal echogenicity. IMPRESSION: Cholelithiasis without sonographic evidence of acute cholecystitis. Electronically Signed   By: Fidela Salisbury M.D.   On: 01/11/2015 22:46    CBC  Recent Labs Lab 01/11/15 2101 01/12/15 0710 01/13/15 0405  WBC 6.6 5.8 5.2  HGB 11.9* 11.3* 11.4*  HCT 35.6* 34.0* 34.1*  PLT 353 321 350  MCV 91.3 91.2 90.7  MCH 30.5 30.3 30.3  MCHC 33.4 33.2 33.4  RDW 13.6 13.4 13.3    Chemistries   Recent Labs Lab 01/11/15 2101 01/12/15 0710 01/13/15 0405  NA 128* 127* 127*  K  4.0 4.0 4.7  CL 90* 91* 92*  CO2 27 26 26   GLUCOSE 133* 111* 98  BUN 7 <5* 7  CREATININE 0.68 0.56 0.62  CALCIUM 8.5* 8.3* 8.3*  AST 25 24  --   ALT 19 16  --   ALKPHOS 64 59  --   BILITOT 0.2* 0.4  --    ------------------------------------------------------------------------------------------------------------------ estimated creatinine clearance is 50.3 mL/min (by C-G formula based on Cr of 0.62). ------------------------------------------------------------------------------------------------------------------ No results for input(s): HGBA1C in the last 72 hours. ------------------------------------------------------------------------------------------------------------------ No results for input(s): CHOL, HDL, LDLCALC, TRIG, CHOLHDL, LDLDIRECT in the last 72 hours. ------------------------------------------------------------------------------------------------------------------ No results for input(s): TSH, T4TOTAL, T3FREE,  THYROIDAB in the last 72 hours.  Invalid input(s): FREET3 ------------------------------------------------------------------------------------------------------------------ No results for input(s): VITAMINB12, FOLATE, FERRITIN, TIBC, IRON, RETICCTPCT in the last 72 hours.  Coagulation profile  Recent Labs Lab 01/11/15 2101 01/12/15 0710 01/13/15 0405  INR 3.09* 2.58* 1.81*    No results for input(s): DDIMER in the last 72 hours.  Cardiac Enzymes  Recent Labs Lab 01/12/15 0122 01/12/15 0710 01/12/15 1430  TROPONINI 0.12* 0.07* 0.05*   ------------------------------------------------------------------------------------------------------------------ Invalid input(s): POCBNP  No results for input(s): GLUCAP in the last 72 hours.   RAI,RIPUDEEP M.D. [facility] 01/13/2015, 1:11 PM  Pager: [phone] Between 7am to 7pm - call Pager - [phone]  After 7pm go to [email or web address] - password [email or web address]  Call night coverage person covering after  7pm

## 2015-01-13 NOTE — Consult Note (Signed)
Reason for Consult: symptomatic cholelithiasis Referring Physician: Dr. Zenovia Jarred   HPI: [name] is a 82 year old female with a history of atrial fibrillation on coumadin, hypothyroidism, HTN and GERD who presented  On 01/11/15 with a 2 week history of epigastric/RUQ abdominal pain.  Onset was sudden.  Coarse is unchanged.  Moderate in severity. Time pattern is intermittent.  Aggravated by PO intake.  She has had daily/nightly symptoms.  Modifying factors include; tums with some relief.  Denies fever, chills or sweats.  Work up showed normal LFTs, normal white count, abdominal US positive for stones.  Cardiac etiology has been ruled out by cardiology.  Upper endoscopy today showed a 4cm hiatal hernia, otherwise normal.  We have therefore been asked to evaluate.   Past Medical History  Diagnosis Date  . Atrial fibrillation (HCC)     Paroxysmal, Flecainide therapy  . Warfarin anticoagulation   . Hypothyroidism   . Carotid bruit     Doppler, December, 2009, no abnormality  . Cardiomegaly     EF normal, echo, 2003  //   EF 60-65%, echo, June 21, 9560, diastolic dysfunction,  . Normal nuclear stress test     Normal, 2001  . HLD (hyperlipidemia)   . HTN (hypertension)   . Osteoarthritis   . Calf pain     September, 2012, at rest  . PONV (postoperative nausea and vomiting)   . GERD (gastroesophageal reflux disease)   . Anemia   . Rectal bleeding 2001    diverticulosis and int hemorrhoids on 07/1999 and 02/2010 colonoscopies.  . Hypothyroidism   . Cataract   . Stroke ([place])   . Insomnia   . History of shingles     Past Surgical History  Procedure Laterality Date  . Total knee arthroplasty  05/26/10    right  . Total shoulder replacement  01/2010    left  . Shoulder open rotator cuff repair      bilateral  . Total knee arthroplasty  11/17/2010    Procedure: TOTAL KNEE ARTHROPLASTY;  Surgeon: Mauri Pole;  Location: [facility];  Service: Orthopedics;  Laterality: Left;  . Total knee  arthroplasty      Right  . Cataract extraction w/phaco Right 04/15/2014    Procedure: CATARACT EXTRACTION PHACO AND INTRAOCULAR LENS PLACEMENT (IOC);  Surgeon: Tonny Branch, MD;  Location: [facility];  Service: Ophthalmology;  Laterality: Right;  CDE: 13.30  . Cataract extraction w/phaco Left 05/13/2014    Procedure: CATARACT EXTRACTION PHACO AND INTRAOCULAR LENS PLACEMENT LEFT EYE;  Surgeon: Tonny Branch, MD;  Location: [facility];  Service: Ophthalmology;  Laterality: Left;  CDE:13.00    Family History  Problem Relation Age of Onset  . COPD Brother   . Atrial fibrillation Brother   . Diabetes Brother   . Liver cancer Brother     Alcohol-related  . Colon cancer Brother   . Heart attack Father   . Rheum arthritis Mother   . Arthritis Mother   . Heart disease Mother   . Heart attack Daughter   . Coronary artery disease Brother   . Gallbladder disease Brother     Social History:  reports that she has never smoked. She has never used smokeless tobacco. She reports that she does not drink alcohol or use illicit drugs.  Allergies:  Allergies  Allergen Reactions  . Penicillins Rash    Has patient had a PCN reaction causing immediate rash, facial/tongue/throat swelling, SOB or lightheadedness with hypotension: Yes Has patient  had a PCN reaction causing severe rash involving mucus membranes or skin necrosis: No Has patient had a PCN reaction that required hospitalization No Has patient had a PCN reaction occurring within the last 10 years: No If all of the above answers are "NO", then may proceed with Cephalosporin use.    Medications:  Scheduled Meds: . atenolol  25 mg Oral Daily  . cefUROXime  500 mg Oral BID  . flecainide  100 mg Oral BID  . furosemide  20 mg Oral Daily  . gabapentin  100 mg Oral q morning - 10a  . hydrocortisone  25 mg Rectal BID  . levothyroxine  50 mcg Oral QAC breakfast  . lisinopril  10 mg Oral Daily  . [START ON 01/14/2015] pantoprazole  40 mg Oral QAC breakfast  .  potassium chloride SA  20 mEq Oral Daily  . sodium chloride  3 mL Intravenous Q12H  . Warfarin - Pharmacist Dosing Inpatient   Does not apply q1800   Continuous Infusions:  PRN Meds:.acetaminophen **OR** acetaminophen, alum & mag hydroxide-simeth, calcium carbonate, ondansetron **OR** ondansetron (ZOFRAN) IV, traMADol, zolpidem   Results for orders placed or performed during the hospital encounter of 01/11/15 (from the past 48 hour(s))  Type and screen [place]     Status: None   Collection Time: 01/11/15  8:59 PM  Result Value Ref Range   ABO/RH(D) A POS    Antibody Screen NEG    Sample Expiration 01/14/2015   ABO/Rh     Status: None   Collection Time: 01/11/15  8:59 PM  Result Value Ref Range   ABO/RH(D) A POS   Urinalysis, Routine w reflex microscopic (not at [facility])     Status: Abnormal   Collection Time: 01/11/15  9:00 PM  Result Value Ref Range   Color, Urine YELLOW YELLOW   APPearance HAZY (A) CLEAR   Specific Gravity, Urine 1.013 1.005 - 1.030   pH 6.5 5.0 - 8.0   Glucose, UA NEGATIVE NEGATIVE mg/dL   Hgb urine dipstick MODERATE (A) NEGATIVE   Bilirubin Urine NEGATIVE NEGATIVE   Ketones, ur NEGATIVE NEGATIVE mg/dL   Protein, ur NEGATIVE NEGATIVE mg/dL   Nitrite NEGATIVE NEGATIVE   Leukocytes, UA NEGATIVE NEGATIVE  Urine microscopic-add on     Status: Abnormal   Collection Time: 01/11/15  9:00 PM  Result Value Ref Range   Squamous Epithelial / LPF 6-30 (A) NONE SEEN   WBC, UA 0-5 0 - 5 WBC/hpf   RBC / HPF 6-30 0 - 5 RBC/hpf   Bacteria, UA RARE (A) NONE SEEN  Comprehensive metabolic panel     Status: Abnormal   Collection Time: 01/11/15  9:01 PM  Result Value Ref Range   Sodium 128 (L) 135 - 145 mmol/L   Potassium 4.0 3.5 - 5.1 mmol/L   Chloride 90 (L) 101 - 111 mmol/L   CO2 27 22 - 32 mmol/L   Glucose, Bld 133 (H) 65 - 99 mg/dL   BUN 7 6 - 20 mg/dL   Creatinine, Ser 0.68 0.44 - 1.00 mg/dL   Calcium 8.5 (L) 8.9 - 10.3 mg/dL   Total Protein  6.7 6.5 - 8.1 g/dL   Albumin 3.2 (L) 3.5 - 5.0 g/dL   AST 25 15 - 41 U/L   ALT 19 14 - 54 U/L   Alkaline Phosphatase 64 38 - 126 U/L   Total Bilirubin 0.2 (L) 0.3 - 1.2 mg/dL   GFR calc non Af Amer >60 >  60 mL/min   GFR calc Af Amer >60 >60 mL/min    Comment: (NOTE) The eGFR has been calculated using the CKD EPI equation. This calculation has not been validated in all clinical situations. eGFR's persistently <60 mL/min signify possible Chronic Kidney Disease.    Anion gap 11 5 - 15  CBC     Status: Abnormal   Collection Time: 01/11/15  9:01 PM  Result Value Ref Range   WBC 6.6 4.0 - 10.5 K/uL   RBC 3.90 3.87 - 5.11 MIL/uL   Hemoglobin 11.9 (L) 12.0 - 15.0 g/dL   HCT 35.6 (L) 36.0 - 46.0 %   MCV 91.3 78.0 - 100.0 fL   MCH 30.5 26.0 - 34.0 pg   MCHC 33.4 30.0 - 36.0 g/dL   RDW 13.6 11.5 - 15.5 %   Platelets 353 150 - 400 K/uL  Protime-INR - (order if Patient is taking Coumadin / Warfarin)     Status: Abnormal   Collection Time: 01/11/15  9:01 PM  Result Value Ref Range   Prothrombin Time 31.3 (H) 11.6 - 15.2 seconds   INR 3.09 (H) 0.00 - 1.49  Lipase, blood     Status: None   Collection Time: 01/11/15  9:01 PM  Result Value Ref Range   Lipase 26 11 - 51 U/L  I-Stat Troponin, ED - 0, 3, 6 hours (not at [facility])     Status: Abnormal   Collection Time: 01/11/15  9:13 PM  Result Value Ref Range   Troponin i, poc 0.09 (HH) 0.00 - 0.08 ng/mL   Comment NOTIFIED PHYSICIAN    Comment 3            Comment: Due to the release kinetics of cTnI, a negative result within the first hours of the onset of symptoms does not rule out myocardial infarction with certainty. If myocardial infarction is still suspected, repeat the test at appropriate intervals.   POC occult blood, ED     Status: Abnormal   Collection Time: 01/11/15  9:14 PM  Result Value Ref Range   Fecal Occult Bld POSITIVE (A) NEGATIVE  POCT i-Stat troponin I     Status: Abnormal   Collection Time: 01/12/15 12:13 AM  Result  Value Ref Range   Troponin i, poc 0.15 (HH) 0.00 - 0.08 ng/mL   Comment NOTIFIED PHYSICIAN    Comment 3            Comment: Due to the release kinetics of cTnI, a negative result within the first hours of the onset of symptoms does not rule out myocardial infarction with certainty. If myocardial infarction is still suspected, repeat the test at appropriate intervals.   Osmolality     Status: Abnormal   Collection Time: 01/12/15  1:22 AM  Result Value Ref Range   Osmolality 263 (L) 275 - 295 mOsm/kg  Troponin I     Status: Abnormal   Collection Time: 01/12/15  1:22 AM  Result Value Ref Range   Troponin I 0.12 (H) <0.031 ng/mL    Comment:        PERSISTENTLY INCREASED TROPONIN VALUES IN THE RANGE OF 0.04-0.49 ng/mL CAN BE SEEN IN:       -UNSTABLE ANGINA       -CONGESTIVE HEART FAILURE       -MYOCARDITIS       -CHEST TRAUMA       -ARRYHTHMIAS       -LATE PRESENTING MYOCARDIAL INFARCTION       -  COPD   CLINICAL FOLLOW-UP RECOMMENDED.   Brain natriuretic peptide     Status: Abnormal   Collection Time: 01/12/15  1:22 AM  Result Value Ref Range   B Natriuretic Peptide 273.1 (H) 0.0 - 100.0 pg/mL  Osmolality, urine     Status: None   Collection Time: 01/12/15  5:30 AM  Result Value Ref Range   Osmolality, Ur 323 300 - 900 mOsm/kg  Troponin I     Status: Abnormal   Collection Time: 01/12/15  7:10 AM  Result Value Ref Range   Troponin I 0.07 (H) <0.031 ng/mL    Comment:        PERSISTENTLY INCREASED TROPONIN VALUES IN THE RANGE OF 0.04-0.49 ng/mL CAN BE SEEN IN:       -UNSTABLE ANGINA       -CONGESTIVE HEART FAILURE       -MYOCARDITIS       -CHEST TRAUMA       -ARRYHTHMIAS       -LATE PRESENTING MYOCARDIAL INFARCTION       -COPD   CLINICAL FOLLOW-UP RECOMMENDED.   Comprehensive metabolic panel     Status: Abnormal   Collection Time: 01/12/15  7:10 AM  Result Value Ref Range   Sodium 127 (L) 135 - 145 mmol/L   Potassium 4.0 3.5 - 5.1 mmol/L   Chloride 91 (L) 101 -  111 mmol/L   CO2 26 22 - 32 mmol/L   Glucose, Bld 111 (H) 65 - 99 mg/dL   BUN <5 (L) 6 - 20 mg/dL   Creatinine, Ser 0.56 0.44 - 1.00 mg/dL   Calcium 8.3 (L) 8.9 - 10.3 mg/dL   Total Protein 6.3 (L) 6.5 - 8.1 g/dL   Albumin 2.9 (L) 3.5 - 5.0 g/dL   AST 24 15 - 41 U/L   ALT 16 14 - 54 U/L   Alkaline Phosphatase 59 38 - 126 U/L   Total Bilirubin 0.4 0.3 - 1.2 mg/dL   GFR calc non Af Amer >60 >60 mL/min   GFR calc Af Amer >60 >60 mL/min    Comment: (NOTE) The eGFR has been calculated using the CKD EPI equation. This calculation has not been validated in all clinical situations. eGFR's persistently <60 mL/min signify possible Chronic Kidney Disease.    Anion gap 10 5 - 15  CBC     Status: Abnormal   Collection Time: 01/12/15  7:10 AM  Result Value Ref Range   WBC 5.8 4.0 - 10.5 K/uL   RBC 3.73 (L) 3.87 - 5.11 MIL/uL   Hemoglobin 11.3 (L) 12.0 - 15.0 g/dL   HCT 34.0 (L) 36.0 - 46.0 %   MCV 91.2 78.0 - 100.0 fL   MCH 30.3 26.0 - 34.0 pg   MCHC 33.2 30.0 - 36.0 g/dL   RDW 13.4 11.5 - 15.5 %   Platelets 321 150 - 400 K/uL  Protime-INR     Status: Abnormal   Collection Time: 01/12/15  7:10 AM  Result Value Ref Range   Prothrombin Time 27.3 (H) 11.6 - 15.2 seconds   INR 2.58 (H) 0.00 - 1.49  Troponin I     Status: Abnormal   Collection Time: 01/12/15  2:30 PM  Result Value Ref Range   Troponin I 0.05 (H) <0.031 ng/mL    Comment:        PERSISTENTLY INCREASED TROPONIN VALUES IN THE RANGE OF 0.04-0.49 ng/mL CAN BE SEEN IN:       -UNSTABLE ANGINA       -  CONGESTIVE HEART FAILURE       -MYOCARDITIS       -CHEST TRAUMA       -ARRYHTHMIAS       -LATE PRESENTING MYOCARDIAL INFARCTION       -COPD   CLINICAL FOLLOW-UP RECOMMENDED.   Protime-INR     Status: Abnormal   Collection Time: 01/13/15  4:05 AM  Result Value Ref Range   Prothrombin Time 20.9 (H) 11.6 - 15.2 seconds   INR 1.81 (H) 0.00 - 2.22  Basic metabolic panel     Status: Abnormal   Collection Time: 01/13/15  4:05  AM  Result Value Ref Range   Sodium 127 (L) 135 - 145 mmol/L   Potassium 4.7 3.5 - 5.1 mmol/L    Comment: SPECIMEN HEMOLYZED. HEMOLYSIS MAY AFFECT INTEGRITY OF RESULTS.   Chloride 92 (L) 101 - 111 mmol/L   CO2 26 22 - 32 mmol/L   Glucose, Bld 98 65 - 99 mg/dL   BUN 7 6 - 20 mg/dL   Creatinine, Ser 0.62 0.44 - 1.00 mg/dL   Calcium 8.3 (L) 8.9 - 10.3 mg/dL   GFR calc non Af Amer >60 >60 mL/min   GFR calc Af Amer >60 >60 mL/min    Comment: (NOTE) The eGFR has been calculated using the CKD EPI equation. This calculation has not been validated in all clinical situations. eGFR's persistently <60 mL/min signify possible Chronic Kidney Disease.    Anion gap 9 5 - 15  CBC     Status: Abnormal   Collection Time: 01/13/15  4:05 AM  Result Value Ref Range   WBC 5.2 4.0 - 10.5 K/uL   RBC 3.76 (L) 3.87 - 5.11 MIL/uL   Hemoglobin 11.4 (L) 12.0 - 15.0 g/dL   HCT 34.1 (L) 36.0 - 46.0 %   MCV 90.7 78.0 - 100.0 fL   MCH 30.3 26.0 - 34.0 pg   MCHC 33.4 30.0 - 36.0 g/dL   RDW 13.3 11.5 - 15.5 %   Platelets 350 150 - 400 K/uL    Dg Chest 2 View  01/11/2015  CLINICAL DATA:  Acute onset of epigastric abdominal pain and vomiting. Generalized weakness. Subacute onset of cough, and acute rectal bleeding. Initial encounter. EXAM: CHEST  2 VIEW COMPARISON:  Chest radiograph and CTA of the chest performed 12/30/2014 FINDINGS: The lungs are well-aerated. Mild vascular congestion is noted, with mild bibasilar atelectasis. There is no evidence of pleural effusion or pneumothorax. The heart is borderline normal in size. No acute osseous abnormalities are seen. IMPRESSION: Mild vascular congestion noted, with mild bibasilar atelectasis. Electronically Signed   By: Garald Balding M.D.   On: 01/11/2015 22:09   US Abdomen Limited Ruq  01/11/2015  CLINICAL DATA:  Right upper quadrant pain for 1 day. Associated nausea and vomiting. EXAM: US ABDOMEN LIMITED - RIGHT UPPER QUADRANT COMPARISON:  CT of the abdomen pelvis  dated 08/04/2011 FINDINGS: Gallbladder: There are 2 mobile gallstones measuring less than 1.5 cm. No gallbladder with wall thickening visualized, gallbladder wall measures 2.7 mm. No sonographic Murphy sign noted by sonographer. Common bile duct: Diameter: 5.3 mm Liver: No focal lesion identified. Within normal limits in parenchymal echogenicity. IMPRESSION: Cholelithiasis without sonographic evidence of acute cholecystitis. Electronically Signed   By: Fidela Salisbury M.D.   On: 01/11/2015 22:46    Review of Systems  Constitutional: Positive for malaise/fatigue. Negative for fever, chills, weight loss and diaphoresis.  Eyes: Negative for blurred vision and double vision.  Respiratory: Negative for  cough, hemoptysis, sputum production, shortness of breath and wheezing.   Cardiovascular: Positive for leg swelling. Negative for chest pain, palpitations, orthopnea, claudication and PND.  Gastrointestinal: Positive for heartburn, nausea, vomiting, abdominal pain, constipation and blood in stool. Negative for diarrhea and melena.  Genitourinary: Negative for dysuria, urgency, frequency, hematuria and flank pain.  Musculoskeletal: Negative for myalgias, back pain and joint pain.  Neurological: Negative for dizziness, tingling, tremors, sensory change, speech change, focal weakness, seizures, loss of consciousness and weakness.  Psychiatric/Behavioral: Negative for depression. The patient is not nervous/anxious.    Blood pressure 171/63, pulse 60, temperature 98.2 F (36.8 C), temperature source Oral, resp. rate 15, height 5' 3"  (1.6 m), weight 63.322 kg (139 lb 9.6 oz), SpO2 92 %. Physical Exam  Constitutional: She is oriented to person, place, and time. She appears well-developed and well-nourished. No distress.  Cardiovascular: Exam reveals no gallop and no friction rub.   No murmur heard. S1S2 irregularly irregular.  Respiratory: Effort normal and breath sounds normal. No respiratory distress.  She has no wheezes. She has no rales. She exhibits no tenderness.  GI: Soft. Bowel sounds are normal. She exhibits no distension and no mass. There is no rebound and no guarding.  TTP RUQ  Musculoskeletal: Normal range of motion. She exhibits no edema or tenderness.  Neurological: She is alert and oriented to person, place, and time.  Skin: Skin is warm and dry. No rash noted. She is not diaphoretic. No erythema. No pallor.  Psychiatric: She has a normal mood and affect. Her behavior is normal. Thought content normal.    Assessment/Plan: Symptomatic cholelithiasis: given and recurrence of symptoms would probably benefit from a cholecystectomy if cleared by cardiology.  Final recommendations to follow. Would hold coumadin for now(INR 1.8). May have heparin/lovenox from a surgical standpoint.  Thank you for the consult.   Courage Biglow ANP-BC 01/13/2015, 10:20 AM

## 2015-01-13 NOTE — Anesthesia Procedure Notes (Signed)
Procedure Name: MAC Date/Time: 01/13/2015 9:14 AM Performed by: Kyung Rudd Pre-anesthesia Checklist: Patient identified, Emergency Drugs available, Suction available, Patient being monitored and Timeout performed Patient Re-evaluated:Patient Re-evaluated prior to inductionOxygen Delivery Method: Nasal cannula Intubation Type: IV induction Placement Confirmation: positive ETCO2

## 2015-01-13 NOTE — Progress Notes (Signed)
  Echocardiogram 2D Echocardiogram has been performed.  [name] 01/13/2015, 11:06 AM

## 2015-01-13 NOTE — Interval H&P Note (Signed)
History and Physical Interval Note:  For EGD today eval epigastric pain, nausea, vomiting On warfarin. Diagnostic study planned, she understands intervention is limited while on warfarin. If negative, then most likely cause of symptoms is felt to be symptomatic cholelithiasis The nature of the procedure, as well as the risks, benefits, and alternatives were carefully and thoroughly reviewed with the patient. Ample time for discussion and questions allowed. The patient understood, was satisfied, and agreed to proceed.    01/13/2015 9:05 AM  [name]  has presented today for surgery, with the diagnosis of epigastric pain, belching, nausea/vomiting.  The various methods of treatment have been discussed with the patient and family. After consideration of risks, benefits and other options for treatment, the patient has consented to  Procedure(s): ESOPHAGOGASTRODUODENOSCOPY (EGD) (N/A) as a surgical intervention .  The patient's history has been reviewed, patient examined, no change in status, stable for surgery.  I have reviewed the patient's chart and labs.  Questions were answered to the patient's satisfaction.     PYRTLE, JAY M

## 2015-01-13 NOTE — Anesthesia Preprocedure Evaluation (Addendum)
Anesthesia Evaluation  Patient identified by MRN, date of birth, ID band Patient awake    Reviewed: Allergy & Precautions, NPO status , Patient's Chart, lab work & pertinent test results  History of Anesthesia Complications (+) PONV  Airway Mallampati: I  TM Distance: >3 FB Neck ROM: Full    Dental  (+) Edentulous Upper, Edentulous Lower   Pulmonary    Pulmonary exam normal        Cardiovascular hypertension, Pt. on medications Normal cardiovascular exam     Neuro/Psych    GI/Hepatic GERD  Medicated and Controlled,  Endo/Other    Renal/GU      Musculoskeletal   Abdominal   Peds  Hematology   Anesthesia Other Findings   Reproductive/Obstetrics                           Anesthesia Physical Anesthesia Plan  ASA: III  Anesthesia Plan: MAC   Post-op Pain Management:    Induction: Intravenous  Airway Management Planned: Nasal Cannula  Additional Equipment:   Intra-op Plan:   Post-operative Plan:   Informed Consent: I have reviewed the patients History and Physical, chart, labs and discussed the procedure including the risks, benefits and alternatives for the proposed anesthesia with the patient or authorized representative who has indicated his/her understanding and acceptance.     Plan Discussed with: CRNA and Surgeon  Anesthesia Plan Comments:        Anesthesia Quick Evaluation

## 2015-01-13 NOTE — Evaluation (Addendum)
Occupational Therapy Evaluation Patient Details Name: [name] MRN: [id number] DOB: [birth year] Today's Date: 01/13/2015    History of Present Illness [name] is an 82 y.o. female with a past medical history significant for pAF on flecainide and warfarin, TIA, bilateral shoulder surgeries, and bilateral TKA, anemia, osteoarthritis, HTN, hypothyroidism, and GERD who presents with worsening epigastric pain for two weeks.   Clinical Impression   Pt admitted with above. Pt independent with ADLs, PTA. Feel pt will benefit from acute OT to increase independence and strength prior to d/c.     Follow Up Recommendations  No OT follow up;Supervision - Intermittent    Equipment Recommendations  None recommended by OT    Recommendations for Other Services       Precautions / Restrictions Precautions Precautions: Fall Precaution Comments: pt has had multiple falls at home and her husband has had mutliple falls recently as well Restrictions Weight Bearing Restrictions: No      Mobility Bed Mobility Overal bed mobility: Modified Independent (sit to supine)                Transfers Overall transfer level: Needs assistance   Transfers: Sit to/from Stand Sit to Stand:  (Min guard-Supervision)         General transfer comment: Min guard without RW; Setup/supervision with RW    Balance      Decreased balance when asked to simulate functional task while standing (single leg stance without UE support)                                      ADL Overall ADL's : Needs assistance/impaired     Grooming: Oral care;Set up;Sitting       Lower Body Bathing: Set up;Supervison/ safety;Sit to/from stand Lower Body Bathing Details (indicate cue type and reason): simulated standing-Min guard assist with UE supported with use of AE     Lower Body Dressing: Set up;Supervision/safety;Sit to/from stand   Toilet Transfer: Min guard;Ambulation (Min  guard-without RW; Setup/supervision with RW; sit to stand from bed)           Functional mobility during ADLs:  (Min guard-without RW; setup/supervision with RW) General ADL Comments: Educated on tub transfer technique with tub bench. Educated on safety such as use of bag on walker and rugs/items on floor. Educated on UB dressing technique and clothing that may be easier to manage.     Vision  history of cataract surgery   Perception     Praxis      Pertinent Vitals/Pain Pain Assessment: No/denies pain     Hand Dominance     Extremity/Trunk Assessment Upper Extremity Assessment Upper Extremity Assessment: LUE deficits/detail;RUE deficits/detail RUE Deficits / Details: generalized weakness LUE Deficits / Details: limited AROM with shoulder flexion-less than 90 degrees; weak in shoulder flexors; history of bilateral shoulder surgery   Lower Extremity Assessment Lower Extremity Assessment: Defer to PT evaluation       Communication Communication Communication: No difficulties   Cognition Arousal/Alertness: Awake/alert Behavior During Therapy: WFL for tasks assessed/performed Overall Cognitive Status: Within Functional Limits for tasks assessed                     General Comments       Exercises       Shoulder Instructions      Home Living Family/patient expects to be discharged to:: Private residence  Living Arrangements: Spouse/significant other Available Help at Discharge: Family;Available 24 hours/day Type of Home: House Home Access: Stairs to enter [employer] of Steps: 1   Home Layout: Laundry or work area in basement;Two level;Able to live on main level with bedroom/bathroom Alternate Level Stairs-Number of Steps: unable to get down there recently   Bathroom Shower/Tub: Tub/shower unit         Home Equipment: Environmental consultant - 2 wheels;Bedside commode;Tub bench;Grab bars - tub/shower;Adaptive equipment Adaptive Equipment: Long-handled  sponge Additional Comments: pt has exercise equipment she was using until recently and was able to get downstairs until last few months. Recently, her husband feel while trying to help her and she fell on top of him. Neither were able to get up. Sister lives nearby and is elderly but in good health and checks on them regularly      Prior Functioning/Environment Level of Independence: Independent             OT Diagnosis: Generalized weakness   OT Problem List: Decreased strength;Decreased range of motion;Impaired balance (sitting and/or standing);Decreased knowledge of use of DME or AE;Decreased knowledge of precautions;Impaired UE functional use   OT Treatment/Interventions: Self-care/ADL training;DME and/or AE instruction;Therapeutic activities;Patient/family education;Balance training;Therapeutic exercise    OT Goals(Current goals can be found in the care plan section) Acute Rehab OT Goals Patient Stated Goal: not stated OT Goal Formulation: With patient Time For Goal Achievement: 01/20/15 Potential to Achieve Goals: Good ADL Goals Pt Will Perform Lower Body Dressing: sit to/from stand;with modified independence (including gathering items) Pt Will Transfer to Toilet: with modified independence;ambulating Pt Will Perform Tub/Shower Transfer: Tub transfer;with supervision;with set-up;ambulating;tub bench Additional ADL Goal #1: Pt will independently perform HEP for bilateral UEs to increase strength.  OT Frequency: Min 2X/week   Barriers to D/C:            Co-evaluation              End of Session Equipment Utilized During Treatment: Gait belt;Rolling walker  Activity Tolerance: Patient tolerated treatment well Patient left: in bed;with call bell/phone within reach;with bed alarm set;with family/visitor present   Time: 1124-1140 OT Time Calculation (min): 16 min Charges:  OT General Charges $OT Visit: 1 Procedure OT Evaluation $OT Eval Moderate Complexity: 1  Procedure G-Codes: OT G-codes **NOT FOR INPATIENT CLASS** Functional Assessment Tool Used: clinical judgment Functional Limitation: Self care Self Care Current Status [id number]): At least 1 percent but less than 20 percent impaired, limited or restricted Self Care Goal Status [id number]): At least 1 percent but less than 20 percent impaired, limited or restricted  Benito Mccreedy OTR/L [id number] 01/13/2015, 1:57 PM

## 2015-01-13 NOTE — Anesthesia Postprocedure Evaluation (Signed)
Anesthesia Post Note  Patient: [name]  Procedure(s) Performed: Procedure(s) (LRB): ESOPHAGOGASTRODUODENOSCOPY (EGD) (N/A)  Patient location during evaluation: PACU Anesthesia Type: MAC Level of consciousness: awake and alert Pain management: pain level controlled Vital Signs Assessment: post-procedure vital signs reviewed and stable Respiratory status: spontaneous breathing, nonlabored ventilation, respiratory function stable and patient connected to nasal cannula oxygen Cardiovascular status: stable and blood pressure returned to baseline Anesthetic complications: no    Last Vitals:  Filed Vitals:   01/13/15 1229 01/13/15 1600  BP: 144/59 115/52  Pulse: 54 59  Temp: 36.8 C   Resp: 16 16    Last Pain:  Filed Vitals:   01/13/15 1639  PainSc: [place] DAVID

## 2015-01-14 ENCOUNTER — Observation Stay (HOSPITAL_COMMUNITY): Payer: Medicare Other

## 2015-01-14 ENCOUNTER — Observation Stay (HOSPITAL_BASED_OUTPATIENT_CLINIC_OR_DEPARTMENT_OTHER): Payer: Medicare Other

## 2015-01-14 ENCOUNTER — Encounter (HOSPITAL_COMMUNITY): Payer: Self-pay | Admitting: Internal Medicine

## 2015-01-14 DIAGNOSIS — R7989 Other specified abnormal findings of blood chemistry: Secondary | ICD-10-CM | POA: Diagnosis not present

## 2015-01-14 DIAGNOSIS — I10 Essential (primary) hypertension: Secondary | ICD-10-CM | POA: Diagnosis not present

## 2015-01-14 DIAGNOSIS — E785 Hyperlipidemia, unspecified: Secondary | ICD-10-CM | POA: Diagnosis not present

## 2015-01-14 DIAGNOSIS — I480 Paroxysmal atrial fibrillation: Secondary | ICD-10-CM | POA: Diagnosis not present

## 2015-01-14 DIAGNOSIS — R1013 Epigastric pain: Secondary | ICD-10-CM | POA: Diagnosis not present

## 2015-01-14 DIAGNOSIS — R1011 Right upper quadrant pain: Secondary | ICD-10-CM | POA: Diagnosis not present

## 2015-01-14 DIAGNOSIS — D649 Anemia, unspecified: Secondary | ICD-10-CM | POA: Diagnosis not present

## 2015-01-14 DIAGNOSIS — Z8673 Personal history of transient ischemic attack (TIA), and cerebral infarction without residual deficits: Secondary | ICD-10-CM | POA: Diagnosis not present

## 2015-01-14 DIAGNOSIS — K8010 Calculus of gallbladder with chronic cholecystitis without obstruction: Secondary | ICD-10-CM | POA: Diagnosis not present

## 2015-01-14 DIAGNOSIS — Z880 Allergy status to penicillin: Secondary | ICD-10-CM | POA: Diagnosis not present

## 2015-01-14 DIAGNOSIS — Z79899 Other long term (current) drug therapy: Secondary | ICD-10-CM | POA: Diagnosis not present

## 2015-01-14 DIAGNOSIS — R0789 Other chest pain: Secondary | ICD-10-CM | POA: Diagnosis not present

## 2015-01-14 DIAGNOSIS — K449 Diaphragmatic hernia without obstruction or gangrene: Secondary | ICD-10-CM | POA: Diagnosis not present

## 2015-01-14 DIAGNOSIS — I493 Ventricular premature depolarization: Secondary | ICD-10-CM | POA: Diagnosis not present

## 2015-01-14 DIAGNOSIS — R531 Weakness: Secondary | ICD-10-CM | POA: Diagnosis not present

## 2015-01-14 DIAGNOSIS — E039 Hypothyroidism, unspecified: Secondary | ICD-10-CM | POA: Diagnosis not present

## 2015-01-14 DIAGNOSIS — E871 Hypo-osmolality and hyponatremia: Secondary | ICD-10-CM | POA: Diagnosis not present

## 2015-01-14 DIAGNOSIS — K649 Unspecified hemorrhoids: Secondary | ICD-10-CM | POA: Diagnosis not present

## 2015-01-14 DIAGNOSIS — K219 Gastro-esophageal reflux disease without esophagitis: Secondary | ICD-10-CM | POA: Diagnosis not present

## 2015-01-14 DIAGNOSIS — Z7901 Long term (current) use of anticoagulants: Secondary | ICD-10-CM | POA: Diagnosis not present

## 2015-01-14 DIAGNOSIS — R0601 Orthopnea: Secondary | ICD-10-CM | POA: Diagnosis not present

## 2015-01-14 LAB — BASIC METABOLIC PANEL
ANION GAP: 10 (ref 5–15)
BUN: 8 mg/dL (ref 6–20)
CALCIUM: 8.3 mg/dL — AB (ref 8.9–10.3)
CO2: 26 mmol/L (ref 22–32)
Chloride: 96 mmol/L — ABNORMAL LOW (ref 101–111)
Creatinine, Ser: 0.69 mg/dL (ref 0.44–1.00)
GFR calc Af Amer: >60 mL/min (ref >60)
GFR calc non Af Amer: >60 mL/min (ref >60)
GLUCOSE: 88 mg/dL (ref 65–99)
Potassium: 4.1 mmol/L (ref 3.5–5.1)
Sodium: 132 mmol/L — ABNORMAL LOW (ref 135–145)

## 2015-01-14 LAB — CBC
HEMATOCRIT: 36.2 % (ref 36.0–46.0)
HEMOGLOBIN: 12 g/dL (ref 12.0–15.0)
MCH: 30.6 pg (ref 26.0–34.0)
MCHC: 33.1 g/dL (ref 30.0–36.0)
MCV: 92.3 fL (ref 78.0–100.0)
Platelets: 385 10*3/uL (ref 150–400)
RBC: 3.92 MIL/uL (ref 3.87–5.11)
RDW: 13.7 % (ref 11.5–15.5)
WBC: 6.1 10*3/uL (ref 4.0–10.5)

## 2015-01-14 LAB — PROTIME-INR
INR: 1.66 — ABNORMAL HIGH (ref 0.00–1.49)
PROTHROMBIN TIME: 19.6 s — AB (ref 11.6–15.2)

## 2015-01-14 LAB — NM MYOCAR MULTI W/SPECT W/WALL MOTION / EF
CHL CUP NUCLEAR SSS: 9
CSEPPHR: 93 {beats}/min
Estimated workload: 1 METS
Exercise duration (min): 5 min
LHR: 0
LVDIAVOL: 54 mL
LVSYSVOL: 13 mL
MPHR: 140 {beats}/min
NUC STRESS TID: 1.29
Percent HR: 66 %
Rest HR: 67 {beats}/min
SDS: 5
SRS: 4

## 2015-01-14 LAB — C DIFFICILE QUICK SCREEN W PCR REFLEX
C Diff antigen: POSITIVE — AB
C Diff interpretation: POSITIVE
C Diff toxin: POSITIVE — AB

## 2015-01-14 MED ORDER — TECHNETIUM TC 99M SESTAMIBI GENERIC - CARDIOLITE
30.0000 | Freq: Once | INTRAVENOUS | Status: AC | PRN
  Administered 2015-01-14: 30 via INTRAVENOUS

## 2015-01-14 MED ORDER — REGADENOSON 0.4 MG/5ML IV SOLN
0.4000 mg | Freq: Once | INTRAVENOUS | Status: AC
  Administered 2015-01-14: 0.4 mg via INTRAVENOUS
  Filled 2015-01-14: qty 5

## 2015-01-14 MED ORDER — TECHNETIUM TC 99M SESTAMIBI GENERIC - CARDIOLITE
10.0000 | Freq: Once | INTRAVENOUS | Status: AC | PRN
  Administered 2015-01-14: 10 via INTRAVENOUS

## 2015-01-14 MED ORDER — CIPROFLOXACIN IN D5W 400 MG/200ML IV SOLN
400.0000 mg | INTRAVENOUS | Status: AC
  Administered 2015-01-15: 400 mg via INTRAVENOUS
  Filled 2015-01-14: qty 200

## 2015-01-14 MED ORDER — REGADENOSON 0.4 MG/5ML IV SOLN
INTRAVENOUS | Status: AC
  Filled 2015-01-14: qty 5

## 2015-01-14 MED ORDER — METRONIDAZOLE 500 MG PO TABS
500.0000 mg | ORAL_TABLET | Freq: Three times a day (TID) | ORAL | Status: DC
  Administered 2015-01-14 – 2015-01-15 (×3): 500 mg via ORAL
  Filled 2015-01-14 (×3): qty 1

## 2015-01-14 MED ORDER — PHYTONADIONE 5 MG PO TABS
2.5000 mg | ORAL_TABLET | Freq: Once | ORAL | Status: AC
  Administered 2015-01-14: 2.5 mg via ORAL
  Filled 2015-01-14: qty 1

## 2015-01-14 NOTE — Progress Notes (Signed)
Occupational Therapy Treatment Patient Details Name: [name] MRN: [id number] DOB: Jun 30, 1932 Today's Date: 01/14/2015    History of present illness [name] is an 82 y.o. female with a past medical history significant for pAF on flecainide and warfarin, TIA, bilateral shoulder surgeries, and bilateral TKA, anemia, osteoarthritis, HTN, hypothyroidism, and GERD who presents with worsening epigastric pain for two weeks.   OT comments  Pt progressing toward goals. Education provided in session. Will continue to follow acutely.  Follow Up Recommendations  No OT follow up;Supervision - Intermittent    Equipment Recommendations  None recommended by OT    Recommendations for Other Services      Precautions / Restrictions Precautions Precautions: Fall Precaution Comments: pt has had multiple falls at home and her husband has had mutliple falls recently as well Restrictions Weight Bearing Restrictions: No       Mobility Bed Mobility Overal bed mobility: Modified Independent             General bed mobility comments: sit to supine  Transfers Overall transfer level: Needs assistance   Transfers: Sit to/from Stand Sit to Stand: Supervision (setup/supervision)               Balance    No LOB in session. Able to perform functional tasks while standing without LOB.                               ADL Overall ADL's : Needs assistance/impaired     Grooming: Wash/dry hands;Supervision/safety;Standing             Upper Body Dressing Details (indicate cue type and reason): OT assisted in tying/untying gown and managing telemetry box, otherwise pt able to don/doff gown with setup assist Lower Body Dressing: Supervision/safety;Sit to/from stand (pt went and gathered underwear from drawer)   Toilet Transfer: Supervision/safety;Ambulation;RW (sit to stand from bed)   Toileting- Clothing Manipulation and Hygiene: Set up;Supervision/safety;Sit to/from  stand Toileting - Clothing Manipulation Details (indicate cue type and reason): washed bottom with washcloths as she was incontient of stool     Functional mobility during ADLs: Supervision/safety;Rolling walker General ADL Comments: Educated on tub transfer technique and recommended pt use bench to get in tub instead of stepping over.       Vision                     Perception     Praxis      Cognition  Awake/Alert Behavior During Therapy: WFL for tasks assessed/performed Overall Cognitive Status: Within Functional Limits for tasks assessed                       Extremity/Trunk Assessment               Exercises  Educated on bilateral UE exercises. Performed bilateral UE exercises. Pt used level 1 theraband for RUE-performed right shoulder flexion and abduction. Pt performed left shoulder exercises, while supine, without theraband.     Shoulder Instructions       General Comments      Pertinent Vitals/ Pain       Pain Assessment: 0-10 Pain Score: 6  Pain Location: stomach Pain Descriptors / Indicators: Dull Pain Intervention(s): Monitored during session  Home Living  Prior Functioning/Environment              Frequency Min 2X/week     Progress Toward Goals  OT Goals(current goals can now be found in the care plan section)  Progress towards OT goals: Progressing toward goals  Acute Rehab OT Goals Patient Stated Goal: not stated OT Goal Formulation: With patient Time For Goal Achievement: 01/20/15 Potential to Achieve Goals: Good ADL Goals Pt Will Perform Lower Body Dressing: sit to/from stand;with modified independence (including gathering items) Pt Will Transfer to Toilet: with modified independence;ambulating Pt Will Perform Tub/Shower Transfer: Tub transfer;with supervision;with set-up;ambulating;tub bench Additional ADL Goal #1: Pt will independently perform HEP for  bilateral UEs to increase strength.  Plan Discharge plan remains appropriate    Co-evaluation                 End of Session Equipment Utilized During Treatment: Rolling walker   Activity Tolerance Patient tolerated treatment well   Patient Left in bed;with bed alarm set;with family/visitor present;with call bell/phone within reach   Nurse Communication          Time: [id number] OT Time Calculation (min): 19 min  Charges: OT General Charges $OT Visit: 1 Procedure OT Treatments $Self Care/Home Management : 8-22 mins  Benito Mccreedy OTR/L [id number] 01/14/2015, 1:17 PM

## 2015-01-14 NOTE — Care Management Obs Status (Signed)
[facility] NOTIFICATION   Patient Details  Name: ABIGAILROSE NATALIE MRN: [id number] Date of Birth: [birth year]   Medicare Observation Status Notification Given:   yes    Royston Bake, RN 01/14/2015, 3:25 PM

## 2015-01-14 NOTE — Progress Notes (Signed)
Physical Therapy Treatment Patient Details Name: CAITLINN VINLUAN MRN: [id number] DOB: 1932-09-03 Today's Date: 01/14/2015    History of Present Illness ISIDORA LUTTRELL is an 82 y.o. female with a past medical history significant for pAF on flecainide and warfarin, TIA, bilateral shoulder surgeries, and bilateral TKA, anemia, osteoarthritis, HTN, hypothyroidism, and GERD who presents with worsening epigastric pain for two weeks.    PT Comments    Making continued progress with mobiltiy and activity tolerance; pt seems to like the RW for stability with amb, and indicated she felt steadier using it; Pt and family educated in [employer] procedures;   Noted likely for surgery tomorrow; I anticipate good progress postop as well.   Follow Up Recommendations  Home health PT;Supervision for mobility/OOB     Equipment Recommendations  Rolling walker with 5" wheels    Recommendations for Other Services       Precautions / Restrictions Precautions Precautions: Fall Precaution Comments: pt has had multiple falls at home and her husband has had mutliple falls recently as well Restrictions Weight Bearing Restrictions: No    Mobility  Bed Mobility Overal bed mobility: Modified Independent             General bed mobility comments: sit to supine  Transfers Overall transfer level: Needs assistance Equipment used: Rolling walker (2 wheeled) Transfers: Sit to/from Stand Sit to Stand: Supervision (setup for RW)         General transfer comment: Pt indicated she likes the RWfor stability  Ambulation/Gait Ambulation/Gait assistance: Min guard;Supervision Ambulation Distance (Feet): 250 Feet Assistive device: Rolling walker (2 wheeled) Gait Pattern/deviations: Step-through pattern Gait velocity: decreased   General Gait Details: minguard assist initially, progressing to Supervision; pt indicated she likes the RW for stability   Stairs            Wheelchair  Mobility    Modified Rankin (Stroke Patients Only)       Balance                                    Cognition Arousal/Alertness: Awake/alert Behavior During Therapy: WFL for tasks assessed/performed Overall Cognitive Status: Within Functional Limits for tasks assessed                      Exercises      General Comments        Pertinent Vitals/Pain Pain Assessment: No/denies pain Pain Score: 6  Pain Location: stomach Pain Descriptors / Indicators: Dull Pain Intervention(s): Monitored during session    Home Living                      Prior Function            PT Goals (current goals can now be found in the care plan section) Acute Rehab PT Goals Patient Stated Goal: not stated PT Goal Formulation: With patient Time For Goal Achievement: 01/26/15 Potential to Achieve Goals: Good Progress towards PT goals: Progressing toward goals    Frequency  Min 3X/week    PT Plan Current plan remains appropriate    Co-evaluation             End of Session   Activity Tolerance: Patient tolerated treatment well Patient left: in chair;with call bell/phone within reach;with family/visitor present     Time: [id number] PT Time Calculation (min) (ACUTE ONLY): 22 min  Charges:  $  Gait Training: 8-22 mins                    G Codes:      Quin Hoop 01/14/2015, 3:52 PM  Roney [place], [place] Pager [phone] Office [phone]

## 2015-01-14 NOTE — Progress Notes (Signed)
[facility]                                                                              Patient Demographics  [name], is a 82 y.o. female, DOB - 02-07-1932, [id number]  Admit date - 01/11/2015   Admitting Physician Edwin Dada, MD  Outpatient Primary MD for the patient is Kenn File, MD  LOS - 3   Chief Complaint  Patient presents with  . Rectal Bleeding  . Weakness       Brief HPI   [name] is a 82 y.o. female with a past medical history significant for pAF on flecainide and warfarin, HTN, hypothyroidism, and GERD presented with worsening epigastric pain for two weeks. Patient reported off-and-on epigastric pain,  reflux symptoms, over the last 6 months, now worse over the last 2 weeks.  2 weeks ago, patient was seen in ER at [facility] on 12/26, where she had 2 hour troponin rule out, negative CT angiogram of the chest (without PE or dissection), and was discharged with plans for follow up RUQ [place]. The night before the admission, patient noticed blood in the stool, has history of bleeding prolapsed hemorrhoids. Right upper quadrant ultrasound showed gallbladder stones without inflammation. TRH was asked to evaluate for admission for elevated troponin and epigastric pain. Patient also reported generalized weakness and exercise intolerance, has chronic orthopnea and peripheral edema.  Cardiology and gastroenterology was consulted. EGD normal. GI recommended surgery consult. Possible cholecystectomy in AM if cleared by cardiology and INR less than 1.5. Stress test planned for cardiac clearance today.  Assessment & Plan    Principal Problem:  Epigastric discomfort, atypical chest pain with GI symptoms, slightly positive troponin - Troponins slightly positive, 2-D echo showed EF of 60-65%  - nuclear medicine stress test planned today. surgery planning cholecystectomy tomorrow   Cholelithiasis/biliary colic - GI was consulted, EGD  normal, recommended surgery for symptomatic cholelithiasis - Surgery planning cholecystectomy once INR less than 1.5 and cleared by cardiology   Weakness, progressive/orthopnea, fluid overload:  -  radiology following, 2-D echo showed EF of 60-65%, stress test planned today  Hyponatremia:  likely due to hypervolemic hyperNa - BNP elevated 273, serum osmolarity 263,  urine osmolarity 323 - improving, continue Lasix, follow BMET   Positive FOBT: Possibly from hemorrhoids  Paroxysmal atrial fibrillation Stable. CHADS2Vasc 4. Currently sinus with frequent PVCs. -Continue atenolol and flecainide. - Coumadin currently on hold for cholecystectomy   Hypothyroidism:  -Continue levothyroxine.   Hypertension:  -Continue atenolol and furosemide -Add lisinopril 10 mg daily   Code Status: Full CODE STATUS  Family Communication: Discussed in detail with the patient, all imaging results, lab results explained to the patient  and husband at the bedside    Disposition Plan:   Time Spent in minutes  25 minutes  Procedures  None  Consults   GI Cardiology  DVT Prophylaxis warfarinOn hold   Scheduled Meds: . atenolol  25 mg Oral Daily  . cefUROXime  500 mg Oral BID  . [START ON 01/15/2015] ciprofloxacin  400 mg Intravenous to SS-Proc  . flecainide  100 mg Oral BID  .  furosemide  20 mg Oral Daily  . gabapentin  100 mg Oral q morning - 10a  . levothyroxine  50 mcg Oral QAC breakfast  . lisinopril  10 mg Oral Daily  . pantoprazole  40 mg Oral QAC breakfast  . potassium chloride SA  20 mEq Oral Daily  . regadenoson      . sodium chloride  3 mL Intravenous Q12H   Continuous Infusions:  PRN Meds:.acetaminophen **OR** acetaminophen, alum & mag hydroxide-simeth, calcium carbonate, HYDROmorphone (DILAUDID) injection, meperidine (DEMEROL) injection, ondansetron **OR** ondansetron (ZOFRAN) IV, ondansetron (ZOFRAN) IV, traMADol, zolpidem   Antibiotics   Anti-infectives    Start      Dose/Rate Route Frequency Ordered Stop   01/15/15 0800  ciprofloxacin (CIPRO) IVPB 400 mg     400 mg 200 mL/hr over 60 Minutes Intravenous To ShortStay Procedural 01/14/15 0816 01/16/15 0800   01/12/15 1000  cefUROXime (CEFTIN) tablet 500 mg     500 mg Oral 2 times daily 01/12/15 0051          Subjective:   [name] was seen and examined today.  denies any specific complaints, awaiting stress test today. No chest pain or shortness of breath.  Patient denies dizziness, chest pain, shortness of breath, new weakness, numbess, tingling. No acute events overnight.    Objective:   Blood pressure 143/68, pulse 76, temperature 98.8 F (37.1 C), temperature source Oral, resp. rate 16, height 5\' 3"  (1.6 m), weight 62.8 kg (138 lb 7.2 oz), SpO2 96 %.  Wt Readings from Last 3 Encounters:  01/14/15 62.8 kg (138 lb 7.2 oz)  01/07/15 66.407 kg (146 lb 6.4 oz)  12/30/14 68.04 kg (150 lb)     Intake/Output Summary (Last 24 hours) at 01/14/15 1151 Last data filed at 01/14/15 1137  Gross per 24 hour  Intake    240 ml  Output    351 ml  Net   -111 ml    Exam  General: Alert and oriented x 3, NAD  HEENT:  PERRLA, EOMI  Neck: Supple, no JVD  CVS: S1 S2clear, RRR  Respiratory: CTAB  Abdomen: Soft, slight epigastric TTP, RUQ TTP, nondistended, + bowel sounds  Ext: no cyanosis clubbing or edema  Neuro: no deficits   Skin: No rashes  Psych: Normal affect and demeanor, alert and oriented x3    Data Review   Micro Results No results found for this or any previous visit (from the past 240 hour(s)).  Radiology Reports Dg Chest 2 View  01/11/2015  CLINICAL DATA:  Acute onset of epigastric abdominal pain and vomiting. Generalized weakness. Subacute onset of cough, and acute rectal bleeding. Initial encounter. EXAM: CHEST  2 VIEW COMPARISON:  Chest radiograph and CTA of the chest performed 12/30/2014 FINDINGS: The lungs are well-aerated. Mild vascular congestion is noted, with  mild bibasilar atelectasis. There is no evidence of pleural effusion or pneumothorax. The heart is borderline normal in size. No acute osseous abnormalities are seen. IMPRESSION: Mild vascular congestion noted, with mild bibasilar atelectasis. Electronically Signed   By: Garald Balding M.D.   On: 01/11/2015 22:09   Dg Chest 2 View  12/30/2014  CLINICAL DATA:  Acute onset of central chest pain, radiating to the shoulders. Initial encounter. EXAM: CHEST  2 VIEW COMPARISON:  Chest radiograph performed 08/10/2013 FINDINGS: The lungs are well-aerated. A small calcified granuloma is suggested at the right midlung zone. There is no evidence of focal opacification, pleural effusion or pneumothorax. The heart is normal in size;  the mediastinal contour is within normal limits. No acute osseous abnormalities are seen. A left shoulder arthroplasty is partially imaged. IMPRESSION: No acute cardiopulmonary process seen. Electronically Signed   By: Garald Balding M.D.   On: 12/30/2014 02:08   Ct Angio Chest Aorta W/cm &/or Wo/cm  12/30/2014  CLINICAL DATA:  Central chest pain and pressure radiating to shoulders. History of atrial fibrillation, into coagulation, hypertension, hyperlipidemia. EXAM: CT ANGIOGRAPHY CHEST WITH CONTRAST TECHNIQUE: Multidetector CT imaging of the chest was performed using the standard protocol during bolus administration of intravenous contrast. Multiplanar CT image reconstructions and MIPs were obtained to evaluate the vascular anatomy. CONTRAST:  183mL OMNIPAQUE IOHEXOL 350 MG/ML SOLN COMPARISON:  Chest radiograph December 30, 2014 FINDINGS: No abnormal density along the course of the thoracic aorta on precontrast imaging. Thoracic aorta is normal in course caliber. Mild calcific atherosclerosis of the aortic arch. No dissection, luminal irregularity, aneurysm, periaortic fluid collection or contrast extravasation. Though not tailored for evaluation, no pulmonary embolism. No lymphadenopathy by  CT size criteria. The heart is mildly enlarged. Minimal coronary artery calcifications. No pericardial effusions. 2 mm RIGHT upper lobe, 2 mm RIGHT upper lobe pulmonary nodule. No pleural effusion or focal consolidation. No pleural effusion. Dependent atelectasis. Tracheobronchial tree is patent and midline. No pneumothorax. Mildly elevated RIGHT hemidiaphragm. 13 mm low-density lesion in the spleen may represent lymphangioma or cyst. Thickened adrenal glands without discrete nodule. 2.2 cm RIGHT upper pole renal cyst. Status post LEFT shoulder arthroplasty. Minimal degenerative change, less than expected for age. IMPRESSION: Mild cardiomegaly.  No acute pulmonary process. Mild calcific atherosclerosis of the aortic arch, no acute vascular process. Two 2 mm RIGHT upper lobe pulmonary nodules. If the patient is at high risk for bronchogenic carcinoma, follow-up chest CT at 1 year is recommended. If the patient is at low risk, no follow-up is needed. This recommendation follows the consensus statement: Guidelines for Management of Small Pulmonary Nodules Detected on CT Scans: A Statement from the [employer] as published in Radiology 2005; 237:395-400. Electronically Signed   By: Elon Alas M.D.   On: 12/30/2014 05:39   US Abdomen Limited Ruq  01/11/2015  CLINICAL DATA:  Right upper quadrant pain for 1 day. Associated nausea and vomiting. EXAM: US ABDOMEN LIMITED - RIGHT UPPER QUADRANT COMPARISON:  CT of the abdomen pelvis dated 08/04/2011 FINDINGS: Gallbladder: There are 2 mobile gallstones measuring less than 1.5 cm. No gallbladder with wall thickening visualized, gallbladder wall measures 2.7 mm. No sonographic Murphy sign noted by sonographer. Common bile duct: Diameter: 5.3 mm Liver: No focal lesion identified. Within normal limits in parenchymal echogenicity. IMPRESSION: Cholelithiasis without sonographic evidence of acute cholecystitis. Electronically Signed   By: Fidela Salisbury M.D.   On:  01/11/2015 22:46    CBC  Recent Labs Lab 01/11/15 2101 01/12/15 0710 01/13/15 0405 01/14/15 0430  WBC 6.6 5.8 5.2 6.1  HGB 11.9* 11.3* 11.4* 12.0  HCT 35.6* 34.0* 34.1* 36.2  PLT 353 321 350 385  MCV 91.3 91.2 90.7 92.3  MCH 30.5 30.3 30.3 30.6  MCHC 33.4 33.2 33.4 33.1  RDW 13.6 13.4 13.3 13.7    Chemistries   Recent Labs Lab 01/11/15 2101 01/12/15 0710 01/13/15 0405 01/14/15 0430  NA 128* 127* 127* 132*  K 4.0 4.0 4.7 4.1  CL 90* 91* 92* 96*  CO2 27 26 26 26   GLUCOSE 133* 111* 98 88  BUN 7 <5* 7 8  CREATININE 0.68 0.56 0.62 0.69  CALCIUM 8.5* 8.3* 8.3*  8.3*  AST 25 24  --   --   ALT 19 16  --   --   ALKPHOS 64 59  --   --   BILITOT 0.2* 0.4  --   --    ------------------------------------------------------------------------------------------------------------------ estimated creatinine clearance is 46.4 mL/min (by C-G formula based on Cr of 0.69). ------------------------------------------------------------------------------------------------------------------ No results for input(s): HGBA1C in the last 72 hours. ------------------------------------------------------------------------------------------------------------------ No results for input(s): CHOL, HDL, LDLCALC, TRIG, CHOLHDL, LDLDIRECT in the last 72 hours. ------------------------------------------------------------------------------------------------------------------ No results for input(s): TSH, T4TOTAL, T3FREE, THYROIDAB in the last 72 hours.  Invalid input(s): FREET3 ------------------------------------------------------------------------------------------------------------------ No results for input(s): VITAMINB12, FOLATE, FERRITIN, TIBC, IRON, RETICCTPCT in the last 72 hours.  Coagulation profile  Recent Labs Lab 01/11/15 2101 01/12/15 0710 01/13/15 0405 01/14/15 0430  INR 3.09* 2.58* 1.81* 1.66*    No results for input(s): DDIMER in the last 72 hours.  Cardiac Enzymes  Recent  Labs Lab 01/12/15 0122 01/12/15 0710 01/12/15 1430  TROPONINI 0.12* 0.07* 0.05*   ------------------------------------------------------------------------------------------------------------------ Invalid input(s): POCBNP  No results for input(s): GLUCAP in the last 72 hours.   Madelina Sanda M.D. [facility] 01/14/2015, 11:51 AM  Pager: [phone] Between 7am to 7pm - call Pager - [phone]  After 7pm go to [email or web address] - password [email or web address]  Call night coverage person covering after 7pm

## 2015-01-14 NOTE — Progress Notes (Addendum)
C-diff results are POSITIVE (both antigen AND toxin);  Reported by Randa Ngo in lab, to Glean Hess RN.  MD notified.  Flagyl orders placed.

## 2015-01-14 NOTE — Anesthesia Preprocedure Evaluation (Addendum)
Anesthesia Evaluation  Patient identified by MRN, date of birth, ID band Patient awake    Reviewed: Allergy & Precautions, NPO status , Patient's Chart, lab work & pertinent test results  History of Anesthesia Complications (+) PONV  Airway Mallampati: I  TM Distance: >3 FB Neck ROM: Full    Dental  (+) Edentulous Upper, Edentulous Lower   Pulmonary neg pulmonary ROS,    Pulmonary exam normal breath sounds clear to auscultation       Cardiovascular hypertension, Pt. on medications and Pt. on home beta blockers Normal cardiovascular exam Rhythm:Regular Rate:Normal  EF 60-65% on 01/13/15 echo. Mild MR. Low risk cardiolite on 01/15/15   Neuro/Psych    GI/Hepatic Neg liver ROS, GERD  Medicated and Controlled,  Endo/Other  Hypothyroidism   Renal/GU negative Renal ROS     Musculoskeletal  (+) Arthritis ,   Abdominal   Peds  Hematology negative hematology ROS (+)   Anesthesia Other Findings   Reproductive/Obstetrics                           Lab Results  Component Value Date   WBC 6.1 01/14/2015   HGB 12.0 01/14/2015   HCT 36.2 01/14/2015   MCV 92.3 01/14/2015   PLT 385 01/14/2015   Lab Results  Component Value Date   CREATININE 0.69 01/14/2015   BUN 8 01/14/2015   NA 132* 01/14/2015   K 4.1 01/14/2015   CL 96* 01/14/2015   CO2 26 01/14/2015    Anesthesia Physical  Anesthesia Plan  ASA: III  Anesthesia Plan: General   Post-op Pain Management:    Induction: Intravenous  Airway Management Planned: Oral ETT  Additional Equipment:   Intra-op Plan:   Post-operative Plan: Extubation in OR  Informed Consent: I have reviewed the patients History and Physical, chart, labs and discussed the procedure including the risks, benefits and alternatives for the proposed anesthesia with the patient or authorized representative who has indicated his/her understanding and acceptance.    Dental advisory given  Plan Discussed with: CRNA  Anesthesia Plan Comments:        Anesthesia Quick Evaluation

## 2015-01-14 NOTE — Progress Notes (Signed)
1 Day Post-Op  Subjective: PT doing well this no issues this AM   Objective: Vital signs in last 24 hours: Temp:  [97.6 F (36.4 C)-99.2 F (37.3 C)] 98.8 F (37.1 C) (01/10 0453) Pulse Rate:  [54-68] 68 (01/10 0453) Resp:  [12-16] 16 (01/10 0453) BP: (115-188)/(52-63) 131/60 mmHg (01/10 0453) SpO2:  [92 %-97 %] 96 % (01/10 0453) Weight:  [62.8 kg (138 lb 7.2 oz)] 62.8 kg (138 lb 7.2 oz) (01/10 0436) Last BM Date: 01/11/15  Intake/Output from previous day: 01/09 0701 - 01/10 0700 In: 380 [P.O.:180; I.V.:200] Out: 351 [Urine:350; Stool:1] Intake/Output this shift:    General appearance: alert and cooperative GI: soft, non-tender; bowel sounds normal; no masses,  no organomegaly  Lab Results:   Recent Labs  01/13/15 0405 01/14/15 0430  WBC 5.2 6.1  HGB 11.4* 12.0  HCT 34.1* 36.2  PLT 350 385   BMET  Recent Labs  01/13/15 0405 01/14/15 0430  NA 127* 132*  K 4.7 4.1  CL 92* 96*  CO2 26 26  GLUCOSE 98 88  BUN 7 8  CREATININE 0.62 0.69  CALCIUM 8.3* 8.3*   PT/INR  Recent Labs  01/13/15 0405 01/14/15 0430  LABPROT 20.9* 19.6*  INR 1.81* 1.66*    Anti-infectives: Anti-infectives    Start     Dose/Rate Route Frequency Ordered Stop   01/12/15 1000  cefUROXime (CEFTIN) tablet 500 mg     500 mg Oral 2 times daily 01/12/15 0051        Assessment/Plan: s/p Procedure(s): ESOPHAGOGASTRODUODENOSCOPY (EGD) (N/A) PLan Lap chole tomorrow as long as cleared by Cards  All risks and benefits were discussed with the patient to generally include: infection, bleeding, possible need for post op ERCP, damage to the bile ducts, and bile leak. Alternatives were offered and described.  All questions were answered and the patient voiced understanding of the procedure and wishes to proceed at this point with a laparoscopic cholecystectomy   LOS: 3 days    Rosario Jacks., [facility] 01/14/2015

## 2015-01-14 NOTE — Progress Notes (Signed)
Subjective: No complaints. Denies CP and dyspnea.   Objective: Vital signs in last 24 hours: Temp:  [98.2 F (36.8 C)-99.2 F (37.3 C)] 98.8 F (37.1 C) (01/10 0453) Pulse Rate:  [54-68] 68 (01/10 0453) Resp:  [16] 16 (01/10 0453) BP: (115-156)/(52-87) 151/87 mmHg (01/10 0952) SpO2:  [95 %-96 %] 96 % (01/10 0453) Weight:  [138 lb 7.2 oz (62.8 kg)] 138 lb 7.2 oz (62.8 kg) (01/10 0436) Last BM Date: 01/11/15  Intake/Output from previous day: 01/09 0701 - 01/10 0700 In: 380 [P.O.:180; I.V.:200] Out: 351 [Urine:350; Stool:1] Intake/Output this shift:    Medications Scheduled Meds: . atenolol  25 mg Oral Daily  . cefUROXime  500 mg Oral BID  . [START ON 01/15/2015] ciprofloxacin  400 mg Intravenous to SS-Proc  . flecainide  100 mg Oral BID  . furosemide  20 mg Oral Daily  . gabapentin  100 mg Oral q morning - 10a  . levothyroxine  50 mcg Oral QAC breakfast  . lisinopril  10 mg Oral Daily  . pantoprazole  40 mg Oral QAC breakfast  . phytonadione  2.5 mg Oral Once  . potassium chloride SA  20 mEq Oral Daily  . regadenoson      . regadenoson  0.4 mg Intravenous Once  . sodium chloride  3 mL Intravenous Q12H   Continuous Infusions:  PRN Meds:.acetaminophen **OR** acetaminophen, alum & mag hydroxide-simeth, calcium carbonate, HYDROmorphone (DILAUDID) injection, meperidine (DEMEROL) injection, ondansetron **OR** ondansetron (ZOFRAN) IV, ondansetron (ZOFRAN) IV, traMADol, zolpidem  PE: General appearance: alert, cooperative and no distress Neck: no carotid bruit and no JVD Lungs: clear to auscultation bilaterally Heart: regular rate and rhythm and frequent PVcs Extremities: no LEE Pulses: 2+ and symmetric Skin: warm and dry Neurologic: Grossly normal  Lab Results:   Recent Labs  01/12/15 0710 01/13/15 0405 01/14/15 0430  WBC 5.8 5.2 6.1  HGB 11.3* 11.4* 12.0  HCT 34.0* 34.1* 36.2  PLT 321 350 385   BMET  Recent Labs  01/12/15 0710 01/13/15 0405  01/14/15 0430  NA 127* 127* 132*  K 4.0 4.7 4.1  CL 91* 92* 96*  CO2 26 26 26   GLUCOSE 111* 98 88  BUN <5* 7 8  CREATININE 0.56 0.62 0.69  CALCIUM 8.3* 8.3* 8.3*   PT/INR  Recent Labs  01/12/15 0710 01/13/15 0405 01/14/15 0430  LABPROT 27.3* 20.9* 19.6*  INR 2.58* 1.81* 1.66*    Assessment/Plan     1. Epigastric pain Lap Chole planned for tomorrow depending on Stress test results.     2. Elevated troponin  Normal EF on echo with no wall motion abnormalities.  Lexiscan cardiolite today    3.  Paroxysmal Atrial fibrillation (HCC)  SR on tele. Coumadin held for surgery. INR 1.66.      4. Hypothyroidism  Per Primary    5. HTN (hypertension)  Labile.  Currently controlled. Continue atenolol and lisinopril.          6. PVCs  Patient noted to have frequent PVCs during stress test but was asymptomatic. Trigeminy during stress that resolved during recovery phase. BB was held prior to stress test. Resume atenolol and monitor.    LOS: 3 days    HAGER, BRYAN PA-C 01/14/2015 10:23 AM  Agree with above assessment and plan.  She did well with her stress test today.  There was no evidence of ischemia by perfusion.  There was no ST segment depression with stress.  This is a low risk study.  The patient is cleared from a cardiac standpoint for gallbladder surgery tomorrow

## 2015-01-14 NOTE — Progress Notes (Signed)
PT Cancellation Note  Patient Details Name: [name] MRN: [id number] DOB: 02-11-1932   Cancelled Treatment:    Reason Eval/Treat Not Completed: Patient at procedure or test/unavailable   Will follow up later today as time allows;  Otherwise, will follow up for PT tomorrow;   Thank you,  Roney Marion, [place] Pager [phone] Office [phone]     Roney Marion [facility] 01/14/2015, 11:06 AM

## 2015-01-15 ENCOUNTER — Encounter (HOSPITAL_COMMUNITY): Payer: Self-pay | Admitting: Anesthesiology

## 2015-01-15 ENCOUNTER — Encounter (HOSPITAL_COMMUNITY)
Admission: EM | Disposition: A | Discharge status: Home / Self Care | Payer: Self-pay | Source: Home / Self Care | Attending: Internal Medicine

## 2015-01-15 ENCOUNTER — Observation Stay (HOSPITAL_COMMUNITY): Payer: Medicare Other | Admitting: Anesthesiology

## 2015-01-15 DIAGNOSIS — R7989 Other specified abnormal findings of blood chemistry: Secondary | ICD-10-CM | POA: Diagnosis not present

## 2015-01-15 DIAGNOSIS — E871 Hypo-osmolality and hyponatremia: Secondary | ICD-10-CM | POA: Diagnosis not present

## 2015-01-15 DIAGNOSIS — Z880 Allergy status to penicillin: Secondary | ICD-10-CM | POA: Diagnosis not present

## 2015-01-15 DIAGNOSIS — K649 Unspecified hemorrhoids: Secondary | ICD-10-CM | POA: Diagnosis not present

## 2015-01-15 DIAGNOSIS — E039 Hypothyroidism, unspecified: Secondary | ICD-10-CM

## 2015-01-15 DIAGNOSIS — Z79899 Other long term (current) drug therapy: Secondary | ICD-10-CM | POA: Diagnosis not present

## 2015-01-15 DIAGNOSIS — I480 Paroxysmal atrial fibrillation: Secondary | ICD-10-CM | POA: Diagnosis not present

## 2015-01-15 DIAGNOSIS — E785 Hyperlipidemia, unspecified: Secondary | ICD-10-CM | POA: Diagnosis not present

## 2015-01-15 DIAGNOSIS — R1013 Epigastric pain: Secondary | ICD-10-CM | POA: Diagnosis not present

## 2015-01-15 DIAGNOSIS — I4891 Unspecified atrial fibrillation: Secondary | ICD-10-CM | POA: Diagnosis not present

## 2015-01-15 DIAGNOSIS — R1011 Right upper quadrant pain: Secondary | ICD-10-CM | POA: Diagnosis not present

## 2015-01-15 DIAGNOSIS — Z8673 Personal history of transient ischemic attack (TIA), and cerebral infarction without residual deficits: Secondary | ICD-10-CM | POA: Diagnosis not present

## 2015-01-15 DIAGNOSIS — R0601 Orthopnea: Secondary | ICD-10-CM | POA: Diagnosis not present

## 2015-01-15 DIAGNOSIS — K8050 Calculus of bile duct without cholangitis or cholecystitis without obstruction: Secondary | ICD-10-CM | POA: Diagnosis not present

## 2015-01-15 DIAGNOSIS — R531 Weakness: Secondary | ICD-10-CM | POA: Diagnosis not present

## 2015-01-15 DIAGNOSIS — D649 Anemia, unspecified: Secondary | ICD-10-CM | POA: Diagnosis not present

## 2015-01-15 DIAGNOSIS — Z7901 Long term (current) use of anticoagulants: Secondary | ICD-10-CM | POA: Diagnosis not present

## 2015-01-15 DIAGNOSIS — I10 Essential (primary) hypertension: Secondary | ICD-10-CM | POA: Diagnosis not present

## 2015-01-15 DIAGNOSIS — I493 Ventricular premature depolarization: Secondary | ICD-10-CM | POA: Diagnosis not present

## 2015-01-15 DIAGNOSIS — K449 Diaphragmatic hernia without obstruction or gangrene: Secondary | ICD-10-CM | POA: Diagnosis not present

## 2015-01-15 DIAGNOSIS — K8010 Calculus of gallbladder with chronic cholecystitis without obstruction: Secondary | ICD-10-CM | POA: Diagnosis not present

## 2015-01-15 DIAGNOSIS — K219 Gastro-esophageal reflux disease without esophagitis: Secondary | ICD-10-CM | POA: Diagnosis not present

## 2015-01-15 HISTORY — PX: CHOLECYSTECTOMY: SHX55

## 2015-01-15 LAB — BASIC METABOLIC PANEL
Anion gap: 11 (ref 5–15)
BUN: 7 mg/dL (ref 6–20)
CALCIUM: 8.4 mg/dL — AB (ref 8.9–10.3)
CHLORIDE: 95 mmol/L — AB (ref 101–111)
CO2: 25 mmol/L (ref 22–32)
Creatinine, Ser: 0.56 mg/dL (ref 0.44–1.00)
GFR calc non Af Amer: >60 mL/min (ref >60)
Glucose, Bld: 84 mg/dL (ref 65–99)
Potassium: 4.1 mmol/L (ref 3.5–5.1)
SODIUM: 131 mmol/L — AB (ref 135–145)

## 2015-01-15 LAB — CBC
HEMATOCRIT: 35.7 % — AB (ref 36.0–46.0)
HEMOGLOBIN: 11.8 g/dL — AB (ref 12.0–15.0)
MCH: 30.2 pg (ref 26.0–34.0)
MCHC: 33.1 g/dL (ref 30.0–36.0)
MCV: 91.3 fL (ref 78.0–100.0)
Platelets: 383 10*3/uL (ref 150–400)
RBC: 3.91 MIL/uL (ref 3.87–5.11)
RDW: 13.7 % (ref 11.5–15.5)
WBC: 6.2 10*3/uL (ref 4.0–10.5)

## 2015-01-15 LAB — PROTIME-INR
INR: 1.31 (ref 0.00–1.49)
PROTHROMBIN TIME: 16.4 s — AB (ref 11.6–15.2)

## 2015-01-15 LAB — SURGICAL PCR SCREEN
MRSA, PCR: NEGATIVE
STAPHYLOCOCCUS AUREUS: NEGATIVE

## 2015-01-15 SURGERY — LAPAROSCOPIC CHOLECYSTECTOMY
Anesthesia: General | Site: Abdomen

## 2015-01-15 MED ORDER — LACTATED RINGERS IV SOLN
INTRAVENOUS | Status: DC | PRN
  Administered 2015-01-15: 08:00:00 via INTRAVENOUS

## 2015-01-15 MED ORDER — 0.9 % SODIUM CHLORIDE (POUR BTL) OPTIME
TOPICAL | Status: DC | PRN
  Administered 2015-01-15: 1000 mL

## 2015-01-15 MED ORDER — FENTANYL CITRATE (PF) 250 MCG/5ML IJ SOLN
INTRAMUSCULAR | Status: AC
  Filled 2015-01-15: qty 5

## 2015-01-15 MED ORDER — FENTANYL CITRATE (PF) 100 MCG/2ML IJ SOLN: 25.0000 ug | INTRAMUSCULAR | Status: DC | PRN

## 2015-01-15 MED ORDER — LIDOCAINE HCL (CARDIAC) 20 MG/ML IV SOLN
INTRAVENOUS | Status: DC | PRN
  Administered 2015-01-15: 100 mg via INTRAVENOUS

## 2015-01-15 MED ORDER — FLUTICASONE PROPIONATE 50 MCG/ACT NA SUSP
1.0000 | Freq: Two times a day (BID) | NASAL | Status: DC | PRN
  Filled 2015-01-15: qty 16

## 2015-01-15 MED ORDER — PROMETHAZINE HCL 25 MG/ML IJ SOLN: 6.2500 mg | INTRAMUSCULAR | Status: DC | PRN

## 2015-01-15 MED ORDER — PROPOFOL 10 MG/ML IV BOLUS
INTRAVENOUS | Status: AC
  Filled 2015-01-15: qty 20

## 2015-01-15 MED ORDER — GLYCOPYRROLATE 0.2 MG/ML IJ SOLN
INTRAMUSCULAR | Status: DC | PRN
  Administered 2015-01-15: 0.4 mg via INTRAVENOUS

## 2015-01-15 MED ORDER — HYDROCODONE-ACETAMINOPHEN 5-325 MG PO TABS
1.0000 | ORAL_TABLET | ORAL | Status: DC | PRN
  Administered 2015-01-15 – 2015-01-16 (×4): 1 via ORAL
  Filled 2015-01-15 (×4): qty 1

## 2015-01-15 MED ORDER — ONDANSETRON HCL 4 MG/2ML IJ SOLN
INTRAMUSCULAR | Status: DC | PRN
  Administered 2015-01-15: 4 mg via INTRAVENOUS

## 2015-01-15 MED ORDER — PHENYLEPHRINE HCL 10 MG/ML IJ SOLN
INTRAMUSCULAR | Status: DC | PRN
  Administered 2015-01-15: 120 ug via INTRAVENOUS
  Administered 2015-01-15: 80 ug via INTRAVENOUS

## 2015-01-15 MED ORDER — SODIUM CHLORIDE 0.9 % IR SOLN
Status: DC | PRN
  Administered 2015-01-15: 1

## 2015-01-15 MED ORDER — PROPOFOL 10 MG/ML IV BOLUS
INTRAVENOUS | Status: DC | PRN
  Administered 2015-01-15: 100 mg via INTRAVENOUS

## 2015-01-15 MED ORDER — ROCURONIUM BROMIDE 100 MG/10ML IV SOLN
INTRAVENOUS | Status: DC | PRN
  Administered 2015-01-15: 30 mg via INTRAVENOUS

## 2015-01-15 MED ORDER — PROSIGHT PO TABS
1.0000 | ORAL_TABLET | Freq: Every day | ORAL | Status: DC
  Administered 2015-01-15: 1 via ORAL
  Filled 2015-01-15 (×2): qty 1

## 2015-01-15 MED ORDER — VANCOMYCIN 50 MG/ML ORAL SOLUTION
125.0000 mg | Freq: Four times a day (QID) | ORAL | Status: DC
Start: 2015-01-15 — End: 2015-01-16
  Administered 2015-01-15 – 2015-01-16 (×5): 125 mg via ORAL
  Filled 2015-01-15 (×8): qty 2.5

## 2015-01-15 MED ORDER — FENTANYL CITRATE (PF) 100 MCG/2ML IJ SOLN
INTRAMUSCULAR | Status: DC | PRN
  Administered 2015-01-15 (×3): 50 ug via INTRAVENOUS

## 2015-01-15 MED ORDER — BUPIVACAINE HCL (PF) 0.25 % IJ SOLN
INTRAMUSCULAR | Status: AC
  Filled 2015-01-15: qty 30

## 2015-01-15 MED ORDER — NEOSTIGMINE METHYLSULFATE 10 MG/10ML IV SOLN
INTRAVENOUS | Status: DC | PRN
  Administered 2015-01-15: 3 mg via INTRAVENOUS

## 2015-01-15 MED ORDER — PRESERVISION AREDS 2 PO CAPS: 1.0000 | ORAL_CAPSULE | Freq: Every day | ORAL | Status: DC

## 2015-01-15 SURGICAL SUPPLY — 42 items
APL SKNCLS STERI-STRIP NONHPOA (GAUZE/BANDAGES/DRESSINGS) ×1
BAG SPEC RTRVL 10 TROC 200 (ENDOMECHANICALS)
BENZOIN TINCTURE PRP APPL 2/3 (GAUZE/BANDAGES/DRESSINGS) ×3 IMPLANT
CANISTER SUCTION 2500CC (MISCELLANEOUS) ×3 IMPLANT
CHLORAPREP W/TINT 26ML (MISCELLANEOUS) ×3 IMPLANT
CLIP LIGATING HEMO O LOK GREEN (MISCELLANEOUS) ×3 IMPLANT
CLOSURE STERI-STRIP 1/2X4 (GAUZE/BANDAGES/DRESSINGS) ×1
CLSR STERI-STRIP ANTIMIC 1/2X4 (GAUZE/BANDAGES/DRESSINGS) ×2 IMPLANT
COVER SURGICAL LIGHT HANDLE (MISCELLANEOUS) ×3 IMPLANT
COVER TRANSDUCER ULTRASND (DRAPES) ×3 IMPLANT
DEVICE TROCAR PUNCTURE CLOSURE (ENDOMECHANICALS) ×3 IMPLANT
ELECT REM PT RETURN 9FT ADLT (ELECTROSURGICAL) ×3
ELECTRODE REM PT RTRN 9FT ADLT (ELECTROSURGICAL) ×1 IMPLANT
GAUZE SPONGE 2X2 8PLY STRL LF (GAUZE/BANDAGES/DRESSINGS) ×1 IMPLANT
GLOVE BIO SURGEON STRL SZ7.5 (GLOVE) ×3 IMPLANT
GLOVE BIOGEL PI IND STRL 7.0 (GLOVE) IMPLANT
GLOVE BIOGEL PI INDICATOR 7.0 (GLOVE) ×4
GLOVE SURG SS PI 7.0 STRL IVOR (GLOVE) ×4 IMPLANT
GOWN STRL REUS W/ TWL LRG LVL3 (GOWN DISPOSABLE) ×2 IMPLANT
GOWN STRL REUS W/ TWL XL LVL3 (GOWN DISPOSABLE) ×1 IMPLANT
GOWN STRL REUS W/TWL LRG LVL3 (GOWN DISPOSABLE) ×6
GOWN STRL REUS W/TWL XL LVL3 (GOWN DISPOSABLE) ×3
KIT BASIN OR (CUSTOM PROCEDURE TRAY) ×3 IMPLANT
KIT ROOM TURNOVER OR (KITS) ×3 IMPLANT
NDL INSUFFLATION 14GA 120MM (NEEDLE) ×1 IMPLANT
NEEDLE INSUFFLATION 14GA 120MM (NEEDLE) ×3 IMPLANT
NS IRRIG 1000ML POUR BTL (IV SOLUTION) ×3 IMPLANT
PAD ARMBOARD 7.5X6 YLW CONV (MISCELLANEOUS) ×6 IMPLANT
POUCH RETRIEVAL ECOSAC 10 (ENDOMECHANICALS) IMPLANT
POUCH RETRIEVAL ECOSAC 10MM (ENDOMECHANICALS)
SCISSORS LAP 5X35 DISP (ENDOMECHANICALS) ×3 IMPLANT
SET IRRIG TUBING LAPAROSCOPIC (IRRIGATION / IRRIGATOR) ×3 IMPLANT
SLEEVE ENDOPATH XCEL 5M (ENDOMECHANICALS) ×3 IMPLANT
SPECIMEN JAR SMALL (MISCELLANEOUS) ×3 IMPLANT
SPONGE GAUZE 2X2 STER 10/PKG (GAUZE/BANDAGES/DRESSINGS) ×2
SUT MNCRL AB 3-0 PS2 18 (SUTURE) ×3 IMPLANT
TOWEL OR 17X24 6PK STRL BLUE (TOWEL DISPOSABLE) ×3 IMPLANT
TOWEL OR 17X26 10 PK STRL BLUE (TOWEL DISPOSABLE) ×3 IMPLANT
TRAY LAPAROSCOPIC MC (CUSTOM PROCEDURE TRAY) ×3 IMPLANT
TROCAR XCEL NON-BLD 11X100MML (ENDOMECHANICALS) ×3 IMPLANT
TROCAR XCEL NON-BLD 5MMX100MML (ENDOMECHANICALS) ×3 IMPLANT
TUBING INSUFFLATION (TUBING) ×3 IMPLANT

## 2015-01-15 NOTE — Anesthesia Procedure Notes (Signed)
Procedure Name: Intubation Date/Time: 01/15/2015 8:47 AM Performed by: Kyung Rudd Pre-anesthesia Checklist: Patient identified, Emergency Drugs available, Suction available, Patient being monitored and Timeout performed Patient Re-evaluated:Patient Re-evaluated prior to inductionOxygen Delivery Method: Circle system utilized Preoxygenation: Pre-oxygenation with 100% oxygen Intubation Type: IV induction Ventilation: Mask ventilation without difficulty Laryngoscope Size: Mac and 3 Grade View: Grade I Tube type: Oral Tube size: 7.5 mm Number of attempts: 1 Airway Equipment and Method: Stylet Placement Confirmation: ETT inserted through vocal cords under direct vision,  positive ETCO2 and breath sounds checked- equal and bilateral Secured at: 21 cm Tube secured with: Tape Dental Injury: Teeth and Oropharynx as per pre-operative assessment

## 2015-01-15 NOTE — Progress Notes (Signed)
OT Cancellation Note  Patient Details Name: MELODEE MARCEAU MRN: [id number] DOB: [birth year]   Cancelled Treatment:    Reason Eval/Treat Not Completed: Patient at procedure or test/ unavailable, pt off unit. Will check back as schedule allows for OT treatment.  Chrys Racer , MS, OTR/L, CLT Pager: [phone]  01/15/2015, 8:35 AM

## 2015-01-15 NOTE — Anesthesia Postprocedure Evaluation (Signed)
Anesthesia Post Note  Patient: [name]  Procedure(s) Performed: Procedure(s) (LRB): LAPAROSCOPIC CHOLECYSTECTOMY (N/A)  Patient location during evaluation: PACU Anesthesia Type: General Level of consciousness: awake and alert Pain management: pain level controlled Vital Signs Assessment: post-procedure vital signs reviewed and stable Respiratory status: spontaneous breathing Cardiovascular status: blood pressure returned to baseline Anesthetic complications: no    Last Vitals:  Filed Vitals:   01/15/15 1046 01/15/15 1047  BP: 137/58   Pulse: 61   Temp:  36.3 C  Resp: 16     Last Pain:  Filed Vitals:   01/15/15 1049  PainSc: Asleep                 Tiajuana Amass

## 2015-01-15 NOTE — Progress Notes (Signed)
[facility]                                                                              Patient Demographics  [name], is a 82 y.o. female, DOB - 1932/10/27, QE:[id number]  Admit date - 01/11/2015   Admitting Physician Terri Dada, MD  Outpatient Primary MD for the patient is Terri File, MD  LOS - 4   Chief Complaint  Patient presents with  . Rectal Bleeding  . Weakness      HPI on 01/11/2015 by Dr. Myrene Rogers [name] is a 82 y.o. female with a past medical history significant for pAF on flecainide and warfarin, HTN, hypothyroidism, and GERD who presents with worsening epigastric pain for two weeks.  He has had on and off epigastric pain, reflux, and IBS for some time for which she takes omeprazole and dicyclomine occasionally. 2 weeks ago on Christmas, the patient ate too much, and developed that night severe epigastric pain, burning in nature, constant, and associated with bloating and eructation. She went to the ER at [facility], where she had 2 hour troponin rule out, negative CT angiogram of the chest (without PE or dissection), and was discharged with plans for follow up RUQ [place] (presuming pain was from gallbladder disease).   Since then, her pain has been present every day, worse with foods (especially spicy foods), worse with lying down. She is sedentary now, and so it is not clear if the pain is exertional, but it seems to be provoked more by eating, she states and family confirm. It is relieved by TUMS, GI cocktail, and dicyclomine. Tonight, the pain was worse again, and her husband noticed blood in the toilet bowl, so he brought her back to the ER.  In the ED, the patient was afebrile and hypertensive. She had stable Hgb and bleeding prolapsed hemorrhoids, per EDP report. She had new hyponatremia and minimally elevated troponin. The lipase was normal. The INR was slightly supratherapeutic. Right upper quadrant ultrasound showed  gallbladder stones without inflammation. TRH was asked to evaluate for admission for elevated troponin and epigastric pain.  Of note, this is superimposed upon 6 weeks of new weakness and exercise intolerance. She used to be able to go up and down stairs in her home easily now no longer can do that. She has to hold onto the wall to walk from room to room and she has fallen several times. She used to be able to work on an exercise machine and her home and she cannot no longer do this either. She has chronic orthopnea, not clearly worse. She has leg swelling that is also chronic and may be worse. She thinks she does sit up at night and feel like her breathing is better.  Assessment & Plan   Epigastric discomfort, atypical chest pain with GI symptoms, mildly elevated troponin -Troponins are mildly elevated, cardiology consulted and appreciated -Echocardiogram EF 60-65%, nuclear stress test- no evidence of ischemia by perfusion, no ST segment depression with stress, low risk study. Patient was cleared for surgery by cardiology  Cholelithiasis/biliary colic -Gastroenterology consulted and appreciated -EGD sound to be normal, a GI recommended general surgery consult -Gen.  surgery conducted laparoscopic cholecystectomy today  Paroxysmal atrial fibrillation/frequent PVCs -CHADSVASC 4 -Coumadin held for surgery -Continue atenolol and flecainide -Patient currently rate and rhythm controlled -Patient is asymptomatic with PVCs, trigeminy was noted during stress test however resolved during recovery phase-atenolol was held prior to stress test  Hypothyroidism -Continue Synthroid  Essential hypertension -Continue atenolol and Lasix, lisinopril was added  Generalized weakness/progressive orthopnea and fluid overload -Echocardiogram as stated above -continue Lasix  Code Status: Full  Family Communication: None at bedside  Disposition Plan: Admitted  Time Spent in minutes   30  minutes  Procedures  Laparoscopic cholecystectomy  Consults   Gen. Surgery Gastroenterology Cardiology  DVT Prophylaxis  Coumadin, held for surgery. SCDs  Lab Results  Component Value Date   PLT 383 01/15/2015    Medications  Scheduled Meds: . atenolol  25 mg Oral Daily  . flecainide  100 mg Oral BID  . furosemide  20 mg Oral Daily  . gabapentin  100 mg Oral q morning - 10a  . levothyroxine  50 mcg Oral QAC breakfast  . lisinopril  10 mg Oral Daily  . multivitamin  1 tablet Oral Q supper  . pantoprazole  40 mg Oral QAC breakfast  . potassium chloride SA  20 mEq Oral Daily  . sodium chloride  3 mL Intravenous Q12H  . vancomycin  125 mg Oral 4 times per day   Continuous Infusions:  PRN Meds:.acetaminophen **OR** acetaminophen, alum & mag hydroxide-simeth, calcium carbonate, fluticasone, HYDROcodone-acetaminophen, ondansetron **OR** ondansetron (ZOFRAN) IV, traMADol, zolpidem  Antibiotics    Anti-infectives    Start     Dose/Rate Route Frequency Ordered Stop   01/15/15 1200  vancomycin (VANCOCIN) 50 mg/mL oral solution 125 mg     125 mg Oral 4 times per day 01/15/15 1114     01/15/15 0800  [MAR Hold]  ciprofloxacin (CIPRO) IVPB 400 mg     (MAR Hold since 01/15/15 0738)   400 mg 200 mL/hr over 60 Minutes Intravenous To ShortStay Procedural 01/14/15 0816 01/15/15 0900   01/14/15 1600  metroNIDAZOLE (FLAGYL) tablet 500 mg  Status:  Discontinued    Comments:  R/t C-diff positive results.   500 mg Oral 3 times per day 01/14/15 1558 01/15/15 1114   01/12/15 1000  cefUROXime (CEFTIN) tablet 500 mg  Status:  Discontinued     500 mg Oral 2 times daily 01/12/15 0051 01/14/15 2025        Subjective:   [name] seen and examined today.  Patient complains of pain, but states it is more tolerable.  Denies chest pain, shortness of breath, N/V/D/C.  Would like to advance her diet.    Objective:   Filed Vitals:   01/15/15 1045 01/15/15 1046 01/15/15 1047 01/15/15 1123   BP:  137/58  134/54  Pulse: 59 61  62  Temp:   97.4 F (36.3 C) 98.4 F (36.9 C)  TempSrc:    Tympanic  Resp: 14 16  16   Height:      Weight:      SpO2: 97% 99%  94%    Wt Readings from Last 3 Encounters:  01/14/15 62.8 kg (138 lb 7.2 oz)  01/07/15 66.407 kg (146 lb 6.4 oz)  12/30/14 68.04 kg (150 lb)     Intake/Output Summary (Last 24 hours) at 01/15/15 1359 Last data filed at 01/15/15 1349  Gross per 24 hour  Intake   1022 ml  Output   1027 ml  Net     -  5 ml    Exam  General: Well developed, well nourished, NAD, appears stated age  HEENT: NCAT,  mucous membranes moist.   Cardiovascular: S1 S2 auscultated, Regular rate and rhythm.  Respiratory: Clear to auscultation bilaterally  Abdomen: Soft, mildly tender, nondistended, + bowel sounds, incisions clean  Extremities: warm dry without cyanosis clubbing or edema  Neuro: AAOx3, nonfocal  Skin: Without rashes exudates or nodules  Psych: Normal affect and demeanor   Data Review   Micro Results Recent Results (from the past 240 hour(s))  C difficile quick scan w PCR reflex     Status: Abnormal   Collection Time: 01/14/15  1:03 PM  Result Value Ref Range Status   C Diff antigen POSITIVE (A) NEGATIVE Final   C Diff toxin POSITIVE (A) NEGATIVE Final   C Diff interpretation Positive for toxigenic C. difficile  Final    Comment: CRITICAL RESULT CALLED TO, READ BACK BY AND VERIFIED WITH: LEWIS RN 15:50 01/14/15 ([email or web address])   Surgical pcr screen     Status: None   Collection Time: 01/15/15  3:45 AM  Result Value Ref Range Status   MRSA, PCR NEGATIVE NEGATIVE Final   Staphylococcus aureus NEGATIVE NEGATIVE Final    Comment:        The Xpert SA Assay (FDA approved for NASAL specimens in patients over 61 years of age), is one component of a comprehensive surveillance program.  Test performance has been validated by [facility] for patients greater than or equal to 33 year old. It is not intended to diagnose  infection nor to guide or monitor treatment.     Radiology Reports Dg Chest 2 View  01/11/2015  CLINICAL DATA:  Acute onset of epigastric abdominal pain and vomiting. Generalized weakness. Subacute onset of cough, and acute rectal bleeding. Initial encounter. EXAM: CHEST  2 VIEW COMPARISON:  Chest radiograph and CTA of the chest performed 12/30/2014 FINDINGS: The lungs are well-aerated. Mild vascular congestion is noted, with mild bibasilar atelectasis. There is no evidence of pleural effusion or pneumothorax. The heart is borderline normal in size. No acute osseous abnormalities are seen. IMPRESSION: Mild vascular congestion noted, with mild bibasilar atelectasis. Electronically Signed   By: Garald Balding M.D.   On: 01/11/2015 22:09   Dg Chest 2 View  12/30/2014  CLINICAL DATA:  Acute onset of central chest pain, radiating to the shoulders. Initial encounter. EXAM: CHEST  2 VIEW COMPARISON:  Chest radiograph performed 08/10/2013 FINDINGS: The lungs are well-aerated. A small calcified granuloma is suggested at the right midlung zone. There is no evidence of focal opacification, pleural effusion or pneumothorax. The heart is normal in size; the mediastinal contour is within normal limits. No acute osseous abnormalities are seen. A left shoulder arthroplasty is partially imaged. IMPRESSION: No acute cardiopulmonary process seen. Electronically Signed   By: Garald Balding M.D.   On: 12/30/2014 02:08   Nm Myocar Multi W/spect W/wall Motion / Ef  01/14/2015   There was no ST segment deviation noted during stress.  This is a low risk study.  No ischemia.  PVC's noted.  Overall reassuring, low risk study. SKAINS, MARK, MD   Ct Angio Chest Aorta W/cm &/or Wo/cm  12/30/2014  CLINICAL DATA:  Central chest pain and pressure radiating to shoulders. History of atrial fibrillation, into coagulation, hypertension, hyperlipidemia. EXAM: CT ANGIOGRAPHY CHEST WITH CONTRAST TECHNIQUE: Multidetector CT imaging of  the chest was performed using the standard protocol during bolus administration of intravenous contrast. Multiplanar CT image reconstructions  and MIPs were obtained to evaluate the vascular anatomy. CONTRAST:  172mL OMNIPAQUE IOHEXOL 350 MG/ML SOLN COMPARISON:  Chest radiograph December 30, 2014 FINDINGS: No abnormal density along the course of the thoracic aorta on precontrast imaging. Thoracic aorta is normal in course caliber. Mild calcific atherosclerosis of the aortic arch. No dissection, luminal irregularity, aneurysm, periaortic fluid collection or contrast extravasation. Though not tailored for evaluation, no pulmonary embolism. No lymphadenopathy by CT size criteria. The heart is mildly enlarged. Minimal coronary artery calcifications. No pericardial effusions. 2 mm RIGHT upper lobe, 2 mm RIGHT upper lobe pulmonary nodule. No pleural effusion or focal consolidation. No pleural effusion. Dependent atelectasis. Tracheobronchial tree is patent and midline. No pneumothorax. Mildly elevated RIGHT hemidiaphragm. 13 mm low-density lesion in the spleen may represent lymphangioma or cyst. Thickened adrenal glands without discrete nodule. 2.2 cm RIGHT upper pole renal cyst. Status post LEFT shoulder arthroplasty. Minimal degenerative change, less than expected for age. IMPRESSION: Mild cardiomegaly.  No acute pulmonary process. Mild calcific atherosclerosis of the aortic arch, no acute vascular process. Two 2 mm RIGHT upper lobe pulmonary nodules. If the patient is at high risk for bronchogenic carcinoma, follow-up chest CT at 1 year is recommended. If the patient is at low risk, no follow-up is needed. This recommendation follows the consensus statement: Guidelines for Management of Small Pulmonary Nodules Detected on CT Scans: A Statement from the [place] as published in Radiology 2005; 237:395-400. Electronically Signed   By: Elon Alas M.D.   On: 12/30/2014 05:39   US Abdomen Limited  Ruq  01/11/2015  CLINICAL DATA:  Right upper quadrant pain for 1 day. Associated nausea and vomiting. EXAM: US ABDOMEN LIMITED - RIGHT UPPER QUADRANT COMPARISON:  CT of the abdomen pelvis dated 08/04/2011 FINDINGS: Gallbladder: There are 2 mobile gallstones measuring less than 1.5 cm. No gallbladder with wall thickening visualized, gallbladder wall measures 2.7 mm. No sonographic Murphy sign noted by sonographer. Common bile duct: Diameter: 5.3 mm Liver: No focal lesion identified. Within normal limits in parenchymal echogenicity. IMPRESSION: Cholelithiasis without sonographic evidence of acute cholecystitis. Electronically Signed   By: Fidela Salisbury M.D.   On: 01/11/2015 22:46    CBC  Recent Labs Lab 01/11/15 2101 01/12/15 0710 01/13/15 0405 01/14/15 0430 01/15/15 0352  WBC 6.6 5.8 5.2 6.1 6.2  HGB 11.9* 11.3* 11.4* 12.0 11.8*  HCT 35.6* 34.0* 34.1* 36.2 35.7*  PLT 353 321 350 385 383  MCV 91.3 91.2 90.7 92.3 91.3  MCH 30.5 30.3 30.3 30.6 30.2  MCHC 33.4 33.2 33.4 33.1 33.1  RDW 13.6 13.4 13.3 13.7 13.7    Chemistries   Recent Labs Lab 01/11/15 2101 01/12/15 0710 01/13/15 0405 01/14/15 0430 01/15/15 0352  NA 128* 127* 127* 132* 131*  K 4.0 4.0 4.7 4.1 4.1  CL 90* 91* 92* 96* 95*  CO2 27 26 26 26 25   GLUCOSE 133* 111* 98 88 84  BUN 7 <5* 7 8 7   CREATININE 0.68 0.56 0.62 0.69 0.56  CALCIUM 8.5* 8.3* 8.3* 8.3* 8.4*  AST 25 24  --   --   --   ALT 19 16  --   --   --   ALKPHOS 64 59  --   --   --   BILITOT 0.2* 0.4  --   --   --    ------------------------------------------------------------------------------------------------------------------ estimated creatinine clearance is 46.4 mL/min (by C-G formula based on Cr of 0.56). ------------------------------------------------------------------------------------------------------------------ No results for input(s): HGBA1C in the last 72  hours. ------------------------------------------------------------------------------------------------------------------ No results for input(s): CHOL, HDL, LDLCALC, TRIG, CHOLHDL, LDLDIRECT in the last 72 hours. ------------------------------------------------------------------------------------------------------------------ No results for input(s): TSH, T4TOTAL, T3FREE, THYROIDAB in the last 72 hours.  Invalid input(s): FREET3 ------------------------------------------------------------------------------------------------------------------ No results for input(s): VITAMINB12, FOLATE, FERRITIN, TIBC, IRON, RETICCTPCT in the last 72 hours.  Coagulation profile  Recent Labs Lab 01/11/15 2101 01/12/15 0710 01/13/15 0405 01/14/15 0430 01/15/15 0352  INR 3.09* 2.58* 1.81* 1.66* 1.31    No results for input(s): DDIMER in the last 72 hours.  Cardiac Enzymes  Recent Labs Lab 01/12/15 0122 01/12/15 0710 01/12/15 1430  TROPONINI 0.12* 0.07* 0.05*   ------------------------------------------------------------------------------------------------------------------ Invalid input(s): POCBNP    Virginie Josten D.O. on 01/15/2015 at 1:59 PM  Between 7am to 7pm - Pager - [phone]  After 7pm go to [email or web address] - password [email or web address]  And look for the night coverage person covering for me after hours  [facility] Office  [phone]

## 2015-01-15 NOTE — Op Note (Signed)
01/15/2015  9:19 AM  PATIENT:  [name]  82 y.o. female  PRE-OPERATIVE DIAGNOSIS:  billary colic  POST-OPERATIVE DIAGNOSIS:  billary colic  PROCEDURE:  Procedure(s): LAPAROSCOPIC CHOLECYSTECTOMY (N/A)  SURGEON:  Surgeon(s) and Role:    * Ralene Ok, MD - Primary  ANESTHESIA:   local and general  EBL:<5cc  Total I/O In: -  Out: 201 [Urine:200; Stool:1]  BLOOD ADMINISTERED:none  DRAINS: none   LOCAL MEDICATIONS USED:  BUPIVICAINE   SPECIMEN:  Source of Specimen:  gallbladder  DISPOSITION OF SPECIMEN:  PATHOLOGY  COUNTS:  YES  TOURNIQUET:  * No tourniquets in log *  DICTATION: .Dragon Dictation  EBL: [id number]   Complications: none   Counts: reported as correct x 2   Findings:chronically inflammed gallbladder  Indications for procedure: Pt is a 82 y/o with RUQ pain and seen to have gallstones.   Details of the procedure: The patient was taken to the operating and placed in the supine position with bilateral SCDs in place. A time out was called and all facts were verified. A pneumoperitoneum was obtained via A Veress needle technique to a pressure of 49mm of mercury. A 53mm trochar was then placed in the right upper quadrant under visualization, and there were no injuries to any abdominal organs. A 11 mm port was then placed in the umbilical region after infiltrating with local anesthesia under direct visualization. A second epigastric port was placed under direct visualization.   The gallbladder was identified and retracted, the peritoneum was then sharply dissected from the gallbladder and this dissection was carried down to Calot's triangle. The cystic duct was identified and stripped away circumferentially and seen going into the gallbladder 360, the critical angle was obtained.    2 clips were placed proximally one distally and the cystic duct transected. The cystic artery was identified and 2 clips placed proximally and one distally and transected. We then  proceeded to remove the gallbladder off the hepatic fossa with Bovie cautery. A retrieval bag was then placed in the abdomen and gallbladder placed in the bag. The hepatic fossa was then reexamined and hemostasis was achieved with Bovie cautery and was excellent at this portion of the case. The subhepatic fossa and perihepatic fossa was then irrigated until the effluent was clear. The specimen bag and specimen were removed from the abdominal cavity.  The 11 mm trocar fascia was reapproximated with the Endo Close #1 Vicryl x1. The pneumoperitoneum was evacuated and all trochars removed under direct visulalization. The skin was then closed with 4-0 Monocryl and the skin dressed with Steri-Strips, gauze, and tape. The patient was awaken from general anesthesia and taken to the recovery room in stable condition.   PLAN OF CARE: Admit to inpatient   PATIENT DISPOSITION:  PACU - hemodynamically stable.   Delay start of Pharmacological VTE agent (>24hrs) due to surgical blood loss or risk of bleeding: no

## 2015-01-15 NOTE — Progress Notes (Signed)
Patient Name: [name] Date of Encounter: 01/15/2015     Principal Problem:   Epigastric pain Active Problems:   UNSPECIFIED ANEMIA   Atrial fibrillation (HCC)   Hypothyroidism   HTN (hypertension)   Elevated troponin   Hyponatremia   Rectal bleeding   Epigastric abdominal pain   External hemorrhoid, bleeding   Emesis    SUBJECTIVE  The patient underwent successful laparoscopic cholecystectomy earlier today.  She is back in her room.  She is alert.  She denies any chest pain or shortness of breath.  She remains in normal sinus rhythm.  CURRENT MEDS . atenolol  25 mg Oral Daily  . flecainide  100 mg Oral BID  . furosemide  20 mg Oral Daily  . gabapentin  100 mg Oral q morning - 10a  . levothyroxine  50 mcg Oral QAC breakfast  . lisinopril  10 mg Oral Daily  . multivitamin  1 tablet Oral Q supper  . pantoprazole  40 mg Oral QAC breakfast  . potassium chloride SA  20 mEq Oral Daily  . sodium chloride  3 mL Intravenous Q12H  . vancomycin  125 mg Oral 4 times per day    OBJECTIVE  Filed Vitals:   01/15/15 1045 01/15/15 1046 01/15/15 1047 01/15/15 1123  BP:  137/58  134/54  Pulse: 59 61  62  Temp:   97.4 F (36.3 C) 98.4 F (36.9 C)  TempSrc:    Tympanic  Resp: 14 16  16   Height:      Weight:      SpO2: 97% 99%  94%    Intake/Output Summary (Last 24 hours) at 01/15/15 1559 Last data filed at 01/15/15 1349  Gross per 24 hour  Intake   1022 ml  Output   1027 ml  Net     -5 ml   Filed Weights   01/12/15 0031 01/13/15 0446 01/14/15 0436  Weight: 141 lb 8 oz (64.184 kg) 139 lb 9.6 oz (63.322 kg) 138 lb 7.2 oz (62.8 kg)    PHYSICAL EXAM  General: Pleasant, NAD.  Lying flat comfortably. Neuro: Alert and oriented X 3. Moves all extremities spontaneously. Psych: Normal affect. HEENT:  Normal  Neck: Supple without bruits or JVD. Lungs:  Resp regular and unlabored, CTA. Heart: RRR no s3, s4, or murmurs. Abdomen: Postoperative and not examined.    Extremities: No clubbing, cyanosis or edema. DP/PT/Radials 2+ and equal bilaterally.  Accessory Clinical Findings  CBC  Recent Labs  01/14/15 0430 01/15/15 0352  WBC 6.1 6.2  HGB 12.0 11.8*  HCT 36.2 35.7*  MCV 92.3 91.3  PLT 385 [id number]   Basic Metabolic Panel  Recent Labs  01/14/15 0430 01/15/15 0352  NA 132* 131*  K 4.1 4.1  CL 96* 95*  CO2 26 25  GLUCOSE 88 84  BUN 8 7  CREATININE 0.69 0.56  CALCIUM 8.3* 8.4*   Liver Function Tests No results for input(s): AST, ALT, ALKPHOS, BILITOT, PROT, ALBUMIN in the last 72 hours. No results for input(s): LIPASE, AMYLASE in the last 72 hours. Cardiac Enzymes No results for input(s): CKTOTAL, CKMB, CKMBINDEX, TROPONINI in the last 72 hours. BNP Invalid input(s): POCBNP D-Dimer No results for input(s): DDIMER in the last 72 hours. Hemoglobin A1C No results for input(s): HGBA1C in the last 72 hours. Fasting Lipid Panel No results for input(s): CHOL, HDL, LDLCALC, TRIG, CHOLHDL, LDLDIRECT in the last 72 hours. Thyroid Function Tests No results for input(s): TSH, T4TOTAL, T3FREE, THYROIDAB  in the last 72 hours.  Invalid input(s): FREET3  TELE  Normal sinus rhythm  ECG    Radiology/Studies  Dg Chest 2 View  01/11/2015  CLINICAL DATA:  Acute onset of epigastric abdominal pain and vomiting. Generalized weakness. Subacute onset of cough, and acute rectal bleeding. Initial encounter. EXAM: CHEST  2 VIEW COMPARISON:  Chest radiograph and CTA of the chest performed 12/30/2014 FINDINGS: The lungs are well-aerated. Mild vascular congestion is noted, with mild bibasilar atelectasis. There is no evidence of pleural effusion or pneumothorax. The heart is borderline normal in size. No acute osseous abnormalities are seen. IMPRESSION: Mild vascular congestion noted, with mild bibasilar atelectasis. Electronically Signed   By: Garald Balding M.D.   On: 01/11/2015 22:09   Dg Chest 2 View  12/30/2014  CLINICAL DATA:  Acute onset of  central chest pain, radiating to the shoulders. Initial encounter. EXAM: CHEST  2 VIEW COMPARISON:  Chest radiograph performed 08/10/2013 FINDINGS: The lungs are well-aerated. A small calcified granuloma is suggested at the right midlung zone. There is no evidence of focal opacification, pleural effusion or pneumothorax. The heart is normal in size; the mediastinal contour is within normal limits. No acute osseous abnormalities are seen. A left shoulder arthroplasty is partially imaged. IMPRESSION: No acute cardiopulmonary process seen. Electronically Signed   By: Garald Balding M.D.   On: 12/30/2014 02:08   Nm Myocar Multi W/spect W/wall Motion / Ef  01/14/2015   There was no ST segment deviation noted during stress.  This is a low risk study.  No ischemia.  PVC's noted.  Overall reassuring, low risk study. SKAINS, MARK, MD   Ct Angio Chest Aorta W/cm &/or Wo/cm  12/30/2014  CLINICAL DATA:  Central chest pain and pressure radiating to shoulders. History of atrial fibrillation, into coagulation, hypertension, hyperlipidemia. EXAM: CT ANGIOGRAPHY CHEST WITH CONTRAST TECHNIQUE: Multidetector CT imaging of the chest was performed using the standard protocol during bolus administration of intravenous contrast. Multiplanar CT image reconstructions and MIPs were obtained to evaluate the vascular anatomy. CONTRAST:  13mL OMNIPAQUE IOHEXOL 350 MG/ML SOLN COMPARISON:  Chest radiograph December 30, 2014 FINDINGS: No abnormal density along the course of the thoracic aorta on precontrast imaging. Thoracic aorta is normal in course caliber. Mild calcific atherosclerosis of the aortic arch. No dissection, luminal irregularity, aneurysm, periaortic fluid collection or contrast extravasation. Though not tailored for evaluation, no pulmonary embolism. No lymphadenopathy by CT size criteria. The heart is mildly enlarged. Minimal coronary artery calcifications. No pericardial effusions. 2 mm RIGHT upper lobe, 2 mm RIGHT  upper lobe pulmonary nodule. No pleural effusion or focal consolidation. No pleural effusion. Dependent atelectasis. Tracheobronchial tree is patent and midline. No pneumothorax. Mildly elevated RIGHT hemidiaphragm. 13 mm low-density lesion in the spleen may represent lymphangioma or cyst. Thickened adrenal glands without discrete nodule. 2.2 cm RIGHT upper pole renal cyst. Status post LEFT shoulder arthroplasty. Minimal degenerative change, less than expected for age. IMPRESSION: Mild cardiomegaly.  No acute pulmonary process. Mild calcific atherosclerosis of the aortic arch, no acute vascular process. Two 2 mm RIGHT upper lobe pulmonary nodules. If the patient is at high risk for bronchogenic carcinoma, follow-up chest CT at 1 year is recommended. If the patient is at low risk, no follow-up is needed. This recommendation follows the consensus statement: Guidelines for Management of Small Pulmonary Nodules Detected on CT Scans: A Statement from the [place] as published in Radiology 2005; 237:395-400. Electronically Signed   By: Thana Farr.D.  On: 12/30/2014 05:39   US Abdomen Limited Ruq  01/11/2015  CLINICAL DATA:  Right upper quadrant pain for 1 day. Associated nausea and vomiting. EXAM: US ABDOMEN LIMITED - RIGHT UPPER QUADRANT COMPARISON:  CT of the abdomen pelvis dated 08/04/2011 FINDINGS: Gallbladder: There are 2 mobile gallstones measuring less than 1.5 cm. No gallbladder with wall thickening visualized, gallbladder wall measures 2.7 mm. No sonographic Murphy sign noted by sonographer. Common bile duct: Diameter: 5.3 mm Liver: No focal lesion identified. Within normal limits in parenchymal echogenicity. IMPRESSION: Cholelithiasis without sonographic evidence of acute cholecystitis. Electronically Signed   By: Fidela Salisbury M.D.   On: 01/11/2015 22:46    ASSESSMENT AND PLAN  1.  Status post cholecystectomy. 2.  Paroxysmal atrial fibrillation, maintaining normal sinus rhythm.   Currently off warfarin which will need to be restarted postoperatively when safe from a surgical standpoint. 3.  Asymptomatic PVCs.  Continue flecainide and atenolol. 4.  Essential hypertension.  Controlled.  Continue atenolol and lisinopril 5.  Hypothyroidism, per primary  Plan: Continue current cardiac medications.  Restart warfarin when okay with surgery. Signed, Warren Danes MD

## 2015-01-15 NOTE — Transfer of Care (Signed)
Immediate Anesthesia Transfer of Care Note  Patient: [name]  Procedure(s) Performed: Procedure(s): LAPAROSCOPIC CHOLECYSTECTOMY (N/A)  Patient Location: PACU  Anesthesia Type:General  Level of Consciousness: awake, alert  and oriented  Airway & Oxygen Therapy: Patient Spontanous Breathing and Patient connected to nasal cannula oxygen  Post-op Assessment: Report given to RN, Post -op Vital signs reviewed and stable and Patient moving all extremities  Post vital signs: Reviewed and stable  Last Vitals:  Filed Vitals:   01/14/15 1200 01/14/15 2047  BP: 138/72 136/67  Pulse: 77 68  Temp: 36.8 C 37 C  Resp: 18 17    Complications: No apparent anesthesia complications

## 2015-01-16 ENCOUNTER — Encounter (HOSPITAL_COMMUNITY): Payer: Self-pay | Admitting: General Surgery

## 2015-01-16 DIAGNOSIS — D649 Anemia, unspecified: Secondary | ICD-10-CM | POA: Diagnosis not present

## 2015-01-16 DIAGNOSIS — Z880 Allergy status to penicillin: Secondary | ICD-10-CM | POA: Diagnosis not present

## 2015-01-16 DIAGNOSIS — K449 Diaphragmatic hernia without obstruction or gangrene: Secondary | ICD-10-CM | POA: Diagnosis not present

## 2015-01-16 DIAGNOSIS — K219 Gastro-esophageal reflux disease without esophagitis: Secondary | ICD-10-CM | POA: Diagnosis not present

## 2015-01-16 DIAGNOSIS — I480 Paroxysmal atrial fibrillation: Secondary | ICD-10-CM | POA: Diagnosis not present

## 2015-01-16 DIAGNOSIS — K648 Other hemorrhoids: Secondary | ICD-10-CM

## 2015-01-16 DIAGNOSIS — R079 Chest pain, unspecified: Secondary | ICD-10-CM | POA: Insufficient documentation

## 2015-01-16 DIAGNOSIS — Z8673 Personal history of transient ischemic attack (TIA), and cerebral infarction without residual deficits: Secondary | ICD-10-CM | POA: Diagnosis not present

## 2015-01-16 DIAGNOSIS — Z7901 Long term (current) use of anticoagulants: Secondary | ICD-10-CM | POA: Diagnosis not present

## 2015-01-16 DIAGNOSIS — R0601 Orthopnea: Secondary | ICD-10-CM | POA: Diagnosis not present

## 2015-01-16 DIAGNOSIS — K8010 Calculus of gallbladder with chronic cholecystitis without obstruction: Secondary | ICD-10-CM | POA: Diagnosis not present

## 2015-01-16 DIAGNOSIS — R0789 Other chest pain: Secondary | ICD-10-CM

## 2015-01-16 DIAGNOSIS — R1013 Epigastric pain: Secondary | ICD-10-CM | POA: Diagnosis not present

## 2015-01-16 DIAGNOSIS — K649 Unspecified hemorrhoids: Secondary | ICD-10-CM | POA: Diagnosis not present

## 2015-01-16 DIAGNOSIS — Z79899 Other long term (current) drug therapy: Secondary | ICD-10-CM | POA: Diagnosis not present

## 2015-01-16 DIAGNOSIS — I493 Ventricular premature depolarization: Secondary | ICD-10-CM | POA: Diagnosis not present

## 2015-01-16 DIAGNOSIS — E871 Hypo-osmolality and hyponatremia: Secondary | ICD-10-CM | POA: Diagnosis not present

## 2015-01-16 DIAGNOSIS — R531 Weakness: Secondary | ICD-10-CM | POA: Diagnosis not present

## 2015-01-16 DIAGNOSIS — E785 Hyperlipidemia, unspecified: Secondary | ICD-10-CM | POA: Diagnosis not present

## 2015-01-16 DIAGNOSIS — E039 Hypothyroidism, unspecified: Secondary | ICD-10-CM | POA: Diagnosis not present

## 2015-01-16 DIAGNOSIS — R7989 Other specified abnormal findings of blood chemistry: Secondary | ICD-10-CM | POA: Diagnosis not present

## 2015-01-16 DIAGNOSIS — I10 Essential (primary) hypertension: Secondary | ICD-10-CM | POA: Diagnosis not present

## 2015-01-16 LAB — BASIC METABOLIC PANEL
Anion gap: 8 (ref 5–15)
BUN: 9 mg/dL (ref 6–20)
CALCIUM: 8.4 mg/dL — AB (ref 8.9–10.3)
CO2: 29 mmol/L (ref 22–32)
CREATININE: 0.73 mg/dL (ref 0.44–1.00)
Chloride: 94 mmol/L — ABNORMAL LOW (ref 101–111)
Glucose, Bld: 95 mg/dL (ref 65–99)
Potassium: 4.2 mmol/L (ref 3.5–5.1)
SODIUM: 131 mmol/L — AB (ref 135–145)

## 2015-01-16 LAB — CBC
HCT: 34.4 % — ABNORMAL LOW (ref 36.0–46.0)
HEMOGLOBIN: 11.4 g/dL — AB (ref 12.0–15.0)
MCH: 30.3 pg (ref 26.0–34.0)
MCHC: 33.1 g/dL (ref 30.0–36.0)
MCV: 91.5 fL (ref 78.0–100.0)
PLATELETS: 380 10*3/uL (ref 150–400)
RBC: 3.76 MIL/uL — ABNORMAL LOW (ref 3.87–5.11)
RDW: 13.7 % (ref 11.5–15.5)
WBC: 7.1 10*3/uL (ref 4.0–10.5)

## 2015-01-16 LAB — PROTIME-INR
INR: 1.18 (ref 0.00–1.49)
PROTHROMBIN TIME: 15.2 s (ref 11.6–15.2)

## 2015-01-16 MED ORDER — VANCOMYCIN 50 MG/ML ORAL SOLUTION: 125.0000 mg | Freq: Four times a day (QID) | ORAL | Status: DC

## 2015-01-16 MED ORDER — WARFARIN - PHARMACIST DOSING INPATIENT: Freq: Every day | Status: DC

## 2015-01-16 MED ORDER — WARFARIN SODIUM 5 MG PO TABS: 5.0000 mg | ORAL_TABLET | Freq: Once | ORAL | Status: DC

## 2015-01-16 MED ORDER — LISINOPRIL 10 MG PO TABS: 10.0000 mg | ORAL_TABLET | Freq: Every day | ORAL | Status: DC

## 2015-01-16 NOTE — Care Management Note (Addendum)
Case Management Note  Patient Details  Name: LETISIA MENNING MRN: [id number] Date of Birth: 1932/01/25  Subjective/Objective:            laparoscopic cholecystectomy 01/15/2015        Action/Plan: NCM spoke to pt and husband at bedside. Husband at home to assist pt with her care. Offered choice for [facility]. Pt requested [facility] for HH. States she had them in the past. Requesting RW and 3n1 for home. Contacted [facility] for DME and HH for scheduled for dc home today.   Expected Discharge Date:  01/15/15               Expected Discharge Plan:  [place]  In-House Referral:  NA  Discharge planning Services  CM Consult  Post Acute Care Choice:  Home Health Choice offered to:  Patient  DME Arranged:  3-N-1, RW DME Agency:  [place]:  PT [facility] Agency:  [place]  Status of Service:  Completed, signed off  Medicare Important Message Given:    Date Medicare IM Given:    Medicare IM give by:    Date Additional Medicare IM Given:    Additional Medicare Important Message give by:     If discussed at [place] of Stay Meetings, dates discussed:    Additional Comments:  Erenest Rasher, RN 01/16/2015, 11:20 AM

## 2015-01-16 NOTE — Care Management Obs Status (Signed)
[place] NOTIFICATION   Patient Details  Name: [name] MRN: [id number] Date of Birth: May 23, 1932   Medicare Observation Status Notification Given:  Yes    Erenest Rasher, RN 01/16/2015, 9:41 AM

## 2015-01-16 NOTE — Progress Notes (Signed)
[place] for Warfarin  Indication: atrial fibrillation  Allergies  Allergen Reactions  . Penicillins Rash    Has patient had a PCN reaction causing immediate rash, facial/tongue/throat swelling, SOB or lightheadedness with hypotension: Yes Has patient had a PCN reaction causing severe rash involving mucus membranes or skin necrosis: No Has patient had a PCN reaction that required hospitalization No Has patient had a PCN reaction occurring within the last 10 years: No If all of the above answers are "NO", then may proceed with Cephalosporin use.   Patient Measurements: Height: 5\' 3"  (160 cm) Weight: 138 lb 10.7 oz (62.9 kg) (scalel b) IBW/kg (Calculated) : 52.4  Vital Signs: Temp: 98.2 F (36.8 C) (01/12 0610) BP: 120/57 mmHg (01/12 0610) Pulse Rate: 58 (01/12 0610)  Labs:  Recent Labs  01/14/15 0430 01/15/15 0352 01/16/15 0700  HGB 12.0 11.8* 11.4*  HCT 36.2 35.7* 34.4*  PLT 385 383 380  LABPROT 19.6* 16.4* 15.2  INR 1.66* 1.31 1.18  CREATININE 0.69 0.56 0.73    Estimated Creatinine Clearance: 50.1 mL/min (by C-G formula based on Cr of 0.73).  Assessment: 82 y/o F on warfarin PTA for h/o afib to be resumed on coumadin today after being held and reversed for surgery. INR is low as expected after multiple held doses and a total of 7.5mg  vitamin K. CBC is stable.   Goal of Therapy:  INR 2-3 Monitor platelets by anticoagulation protocol: Yes   Plan:  - Warfarin 5mg  PO x 1 tonight - Daily INR  *If discharged, would resume home regimen (it may take her longer to become therapeutic due to vitamin K doses) and recheck an INR next week.  Salome Arnt, PharmD, BCPS Pager # [phone] 01/16/2015 10:39 AM

## 2015-01-16 NOTE — Progress Notes (Signed)
1 Day Post-Op  Subjective: Pt did well overnight with min pain  Objective: Vital signs in last 24 hours: Temp:  [97 F (36.1 C)-98.4 F (36.9 C)] 98.2 F (36.8 C) (01/12 0610) Pulse Rate:  [49-65] 58 (01/12 0610) Resp:  [12-23] 16 (01/12 0610) BP: (111-167)/(52-85) 120/57 mmHg (01/12 0610) SpO2:  [92 %-99 %] 92 % (01/12 0610) Weight:  [62.9 kg (138 lb 10.7 oz)] 62.9 kg (138 lb 10.7 oz) (01/12 0610) Last BM Date: 01/14/15  Intake/Output from previous day: 01/11 0701 - 01/12 0700 In: 687.9 [P.O.:525; I.V.:162.9] Out: 906 [Urine:880; Stool:1; Blood:25] Intake/Output this shift:    General appearance: alert and cooperative GI: soft, approp ttp, nd  Lab Results:   Recent Labs  01/15/15 0352 01/16/15 0700  WBC 6.2 7.1  HGB 11.8* 11.4*  HCT 35.7* 34.4*  PLT 383 380   BMET  Recent Labs  01/15/15 0352 01/16/15 0700  NA 131* 131*  K 4.1 4.2  CL 95* 94*  CO2 25 29  GLUCOSE 84 95  BUN 7 9  CREATININE 0.56 0.73  CALCIUM 8.4* 8.4*   PT/INR  Recent Labs  01/15/15 0352 01/16/15 0700  LABPROT 16.4* 15.2  INR 1.31 1.18   ABG No results for input(s): PHART, HCO3 in the last 72 hours.  Invalid input(s): PCO2, PO2  Studies/Results: Nm Myocar Multi W/spect W/wall Motion / Ef  01/14/2015   There was no ST segment deviation noted during stress.  This is a low risk study.  No ischemia.  PVC's noted.  Overall reassuring, low risk study. Candee Furbish, MD    Anti-infectives: Anti-infectives    Start     Dose/Rate Route Frequency Ordered Stop   01/15/15 1200  vancomycin (VANCOCIN) 50 mg/mL oral solution 125 mg     125 mg Oral 4 times per day 01/15/15 1114     01/15/15 0800  [MAR Hold]  ciprofloxacin (CIPRO) IVPB 400 mg     (MAR Hold since 01/15/15 0738)   400 mg 200 mL/hr over 60 Minutes Intravenous To ShortStay Procedural 01/14/15 0816 01/15/15 0900   01/14/15 1600  metroNIDAZOLE (FLAGYL) tablet 500 mg  Status:  Discontinued    Comments:  R/t C-diff positive  results.   500 mg Oral 3 times per day 01/14/15 1558 01/15/15 1114   01/12/15 1000  cefUROXime (CEFTIN) tablet 500 mg  Status:  Discontinued     500 mg Oral 2 times daily 01/12/15 0051 01/14/15 2025      Assessment/Plan: s/p Procedure(s): LAPAROSCOPIC CHOLECYSTECTOMY (N/A) Advance diet as tol, low fat OK from our standpoint for DC when OK with Medicine F/u placed in the chart OK to shower today if pt wants  LOS: 5 days    Rosario Jacks., [facility] 01/16/2015

## 2015-01-16 NOTE — Discharge Instructions (Signed)
[facility] ______[facility] °LAPAROSCOPIC SURGERY: POST OP INSTRUCTIONS °Always review your discharge instruction sheet given to you by the facility where your surgery was performed. °IF YOU HAVE DISABILITY OR FAMILY LEAVE FORMS, YOU MUST BRING THEM TO THE OFFICE FOR PROCESSING.   °DO NOT GIVE THEM TO YOUR DOCTOR. ° °1. A prescription for pain medication may be given to you upon discharge.  Take your pain medication as prescribed, if needed.  If narcotic pain medicine is not needed, then you may take acetaminophen (Tylenol) or ibuprofen (Advil) as needed. °2. Take your usually prescribed medications unless otherwise directed. °3. If you need a refill on your pain medication, please contact your pharmacy.  They will contact our office to request authorization. Prescriptions will not be filled after 5pm or on week-ends. °4. You should follow a light diet the first few days after arrival home, such as soup and crackers, etc.  Be sure to include lots of fluids daily. °5. Most patients will experience some swelling and bruising in the area of the incisions.  Ice packs will help.  Swelling and bruising can take several days to resolve.  °6. It is common to experience some constipation if taking pain medication after surgery.  Increasing fluid intake and taking a stool softener (such as Colace) will usually help or prevent this problem from occurring.  A mild laxative (Milk of Magnesia or Miralax) should be taken according to package instructions if there are no bowel movements after 48 hours. °7. Unless discharge instructions indicate otherwise, you may remove your bandages 24-48 hours after surgery, and you may shower at that time.  You may have steri-strips (small skin tapes) in place directly over the incision.  These strips should be left on the skin for 7-10 days.  If your surgeon used skin glue on the incision, you may shower in 24 hours.  The glue will flake off over the next 2-3 weeks.  Any sutures or  staples will be removed at the office during your follow-up visit. °8. ACTIVITIES:  You may resume regular (light) daily activities beginning the next day--such as daily self-care, walking, climbing stairs--gradually increasing activities as tolerated.  You may have sexual intercourse when it is comfortable.  Refrain from any heavy lifting or straining until approved by your doctor. °a. You may drive when you are no longer taking prescription pain medication, you can comfortably wear a seatbelt, and you can safely maneuver your car and apply brakes. °b. RETURN TO WORK:  __________________________________________________________ °9. You should see your doctor in the office for a follow-up appointment approximately 2-3 weeks after your surgery.  Make sure that you call for this appointment within a day or two after you arrive home to insure a convenient appointment time. °10. OTHER INSTRUCTIONS: __________________________________________________________________________________________________________________________ __________________________________________________________________________________________________________________________ °WHEN TO CALL YOUR DOCTOR: °1. Fever over 101.0 °2. Inability to urinate °3. Continued bleeding from incision. °4. Increased pain, redness, or drainage from the incision. °5. Increasing abdominal pain ° °The clinic staff is available to answer your questions during regular business hours.  Please don’t hesitate to call and ask to speak to one of the nurses for clinical concerns.  If you have a medical emergency, go to the nearest emergency room or call 911.  A surgeon from [facility] is always on call at the hospital. °[address], [address], [place], [place]  [address] ? [address] [address], [place], [place]   [address] °[phone] ? [phone] ? FAX [phone] °Web site:   [email or web address] °

## 2015-01-16 NOTE — Discharge Summary (Signed)
Physician Discharge Summary  [name] [id number] DOB: [birth year] DOA: 01/11/2015  PCP: Kenn File, MD  Admit date: 01/11/2015 Discharge date: 01/16/2015  Time spent: 45 minutes  Recommendations for Outpatient Follow-up:  Patient will be discharged to home with home health PT.  Patient will need to follow up with primary care provider within one week of discharge.  Patient will also need to follow up with surgery on 02/05/2015.  Follow up with Dr. Percival Spanish as scheduled.  Have INR checked in 3-4 days. Patient should continue medications as prescribed.  Patient should follow a heart healthy, low fat diet.   Discharge Diagnoses:  Epigastric discomfort, atypical chest pain with GI symptoms, mildly elevated troponin Cholelithiasis/biliary colic C difficile diarrhea Paroxysmal atrial fibrillation/frequent PVCs Hypothyroidism Essential hypertension Generalized weakness/progressive orthopnea and fluid overload  Discharge Condition: Stable  Diet recommendation: Heart healthy, low fat  Filed Weights   01/13/15 0446 01/14/15 0436 01/16/15 0610  Weight: 63.322 kg (139 lb 9.6 oz) 62.8 kg (138 lb 7.2 oz) 62.9 kg (138 lb 10.7 oz)    History of present illness:  on 01/11/2015 by Dr. Myrene Buddy [name] is a 82 y.o. female with a past medical history significant for pAF on flecainide and warfarin, HTN, hypothyroidism, and GERD who presents with worsening epigastric pain for two weeks.  He has had on and off epigastric pain, reflux, and IBS for some time for which she takes omeprazole and dicyclomine occasionally. 2 weeks ago on Christmas, the patient ate too much, and developed that night severe epigastric pain, burning in nature, constant, and associated with bloating and eructation. She went to the ER at [facility], where she had 2 hour troponin rule out, negative CT angiogram of the chest (without PE or dissection), and was discharged with plans for follow up RUQ [place]  (presuming pain was from gallbladder disease).   Since then, her pain has been present every day, worse with foods (especially spicy foods), worse with lying down. She is sedentary now, and so it is not clear if the pain is exertional, but it seems to be provoked more by eating, she states and family confirm. It is relieved by TUMS, GI cocktail, and dicyclomine. Tonight, the pain was worse again, and her husband noticed blood in the toilet bowl, so he brought her back to the ER.  In the ED, the patient was afebrile and hypertensive. She had stable Hgb and bleeding prolapsed hemorrhoids, per EDP report. She had new hyponatremia and minimally elevated troponin. The lipase was normal. The INR was slightly supratherapeutic. Right upper quadrant ultrasound showed gallbladder stones without inflammation. TRH was asked to evaluate for admission for elevated troponin and epigastric pain.  Of note, this is superimposed upon 6 weeks of new weakness and exercise intolerance. She used to be able to go up and down stairs in her home easily now no longer can do that. She has to hold onto the wall to walk from room to room and she has fallen several times. She used to be able to work on an exercise machine and her home and she cannot no longer do this either. She has chronic orthopnea, not clearly worse. She has leg swelling that is also chronic and may be worse. She thinks she does sit up at night and feel like her breathing is better.  Hospital Course:  Epigastric discomfort, atypical chest pain with GI symptoms, mildly elevated troponin -Troponins are mildly elevated, cardiology consulted and appreciated -Echocardiogram EF 60-65%, nuclear  stress test- no evidence of ischemia by perfusion, no ST segment depression with stress, low risk study. Patient was cleared for surgery by cardiology  Cholelithiasis/biliary colic -Gastroenterology consulted and appreciated -EGD sound to be normal, a GI recommended  general surgery consult -Gen. surgery conducted laparoscopic cholecystectomy 01/15/2015 -Able to tolerate diet today  Cdiff Diarrhea -Cdiff + -Continue oral Vancomycin -diarrhea improving  Paroxysmal atrial fibrillation/frequent PVCs -CHADSVASC 4 -Coumadin held for surgery, restarted at home dose today- patient will need INR check in 3-4 days -Continue atenolol and flecainide -Patient currently rate and rhythm controlled -Patient is asymptomatic with PVCs, trigeminy was noted during stress test however resolved during recovery phase-atenolol was held prior to stress test  Hypothyroidism -Continue Synthroid  Essential hypertension -Continue atenolol and Lasix, lisinopril was added  Generalized weakness/progressive orthopnea and fluid overload -Echocardiogram as stated above -continue Lasix -PT recommended Home health -OT - no follow up needed  Procedures  Laparoscopic cholecystectomy  Consults  Gen. Surgery Gastroenterology Cardiology  Discharge Exam: Filed Vitals:   01/15/15 2119 01/16/15 0610  BP: 111/52 120/57  Pulse: 55 58  Temp: 98.2 F (36.8 C) 98.2 F (36.8 C)  Resp: 16 16   Exam  General: Well developed, well nourished, NAD  HEENT: NCAT, mucous membranes moist.   Cardiovascular: S1 S2 auscultated, Regular rate and rhythm, no murmurs  Respiratory: Clear to auscultation bilaterally  Abdomen: Soft, mildly tender, nondistended, + bowel sounds  Extremities: warm dry without cyanosis clubbing or edema  Neuro: AAOx3, nonfocal  Psych: Normal affect and demeanor, pleasant  Discharge Instructions      Discharge Instructions    Discharge instructions    Complete by:  As directed   Patient will be discharged to home.  Patient will need to follow up with primary care provider within one week of discharge.  Patient will also need to follow up with surgery on 02/05/2015.  Follow up with Dr. Percival Spanish as scheduled.  Have INR checked in 3-4 days. Patient  should continue medications as prescribed.  Patient should follow a heart healthy, low fat diet.            Medication List    STOP taking these medications        cefdinir 300 MG capsule  Commonly known as:  OMNICEF     omeprazole 20 MG capsule  Commonly known as:  PRILOSEC      TAKE these medications        acetaminophen 500 MG tablet  Commonly known as:  TYLENOL  Take 500 mg by mouth every 6 (six) hours as needed for moderate pain.     alendronate 70 MG tablet  Commonly known as:  FOSAMAX  Take 1 tablet (70 mg total) by mouth once a week. Take with a full glass of water on an empty stomach.     atenolol 25 MG tablet  Commonly known as:  TENORMIN  Take 25 mg by mouth daily.     dicyclomine 10 MG capsule  Commonly known as:  BENTYL  Take 10 mg by mouth 2 (two) times daily as needed for spasms (upset stomach).     ergocalciferol 50000 units capsule  Commonly known as:  VITAMIN D2  Take 50,000 Units by mouth once a week. On Sundays     flecainide 100 MG tablet  Commonly known as:  TAMBOCOR  Take 100 mg by mouth 2 (two) times daily.     fluticasone 50 MCG/ACT nasal spray  Commonly known as:  FLONASE  Place 1 spray into both nostrils 2 (two) times daily as needed for allergies or rhinitis.     furosemide 40 MG tablet  Commonly known as:  LASIX  Take 0.5-1 tablets (20-40 mg total) by mouth every morning.     gabapentin 100 MG capsule  Commonly known as:  NEURONTIN  Take 1 tab in the morning     GARLIC PO  Take 1 tablet by mouth daily after supper.     levothyroxine 50 MCG tablet  Commonly known as:  SYNTHROID, LEVOTHROID  Take 50 mcg by mouth daily.     lisinopril 10 MG tablet  Commonly known as:  PRINIVIL,ZESTRIL  Take 1 tablet (10 mg total) by mouth daily.     meclizine 25 MG tablet  Commonly known as:  ANTIVERT  Take 25 mg by mouth daily as needed for dizziness (vertigo). Inner ear     mupirocin ointment 2 %  Commonly known as:  BACTROBAN  Place  1 application into the nose 2 (two) times daily.     OVER THE COUNTER MEDICATION  Place 1 drop into both ears daily as needed (dryness). Over the counter oily ear drops     OVER THE COUNTER MEDICATION  Place 1 drop into both eyes daily as needed (dry eyes). Lubricating eye drops     potassium chloride SA 20 MEQ tablet  Commonly known as:  K-DUR,KLOR-CON  Take 20 mEq by mouth daily with supper.     PRESERVISION AREDS 2 Caps  Take 1 capsule by mouth daily with supper.     traMADol 50 MG tablet  Commonly known as:  ULTRAM  Take 1 tablet (50 mg total) by mouth every 8 (eight) hours as needed.     vancomycin 50 mg/mL oral solution  Commonly known as:  VANCOCIN  Take 2.5 mLs (125 mg total) by mouth every 6 (six) hours. Dispense either solution or tabs based on insurance coverage.     vitamin B-12 500 MCG tablet  Commonly known as:  CYANOCOBALAMIN  Take 500 mcg by mouth 3 (three) times a week.     warfarin 5 MG tablet  Commonly known as:  COUMADIN  Take 2.5-5 mg by mouth daily. Take 1/2 tablet (2.5 mg) by mouth on Fridays, take 1 tablet (5 mg) on all other days of the week     zolpidem 10 MG tablet  Commonly known as:  AMBIEN  Take 0.5-1 tablets (5-10 mg total) by mouth at bedtime as needed for sleep. Take 1/2 to 1 tablet at bedtime as needed       Allergies  Allergen Reactions  . Penicillins Rash    Has patient had a PCN reaction causing immediate rash, facial/tongue/throat swelling, SOB or lightheadedness with hypotension: Yes Has patient had a PCN reaction causing severe rash involving mucus membranes or skin necrosis: No Has patient had a PCN reaction that required hospitalization No Has patient had a PCN reaction occurring within the last 10 years: No If all of the above answers are "NO", then may proceed with Cephalosporin use.   Follow-up Information    Follow up with LIEPINS, ANDY, PA-C On 02/05/2015.   Specialty:  Surgery   Why:  [facility] Surgery, 9:15am,  arrive no later than 8:45am for paperwork and check in   Contact information:   [place]  [phone]       Follow up with Kenn File, MD. Schedule an appointment as soon as possible  for a visit in 1 week.   Specialty:  Family Medicine   Why:  hospital follow up   Contact information:   [place] [id number] [phone]        The results of significant diagnostics from this hospitalization (including imaging, microbiology, ancillary and laboratory) are listed below for reference.    Significant Diagnostic Studies: Dg Chest 2 View  01/11/2015  CLINICAL DATA:  Acute onset of epigastric abdominal pain and vomiting. Generalized weakness. Subacute onset of cough, and acute rectal bleeding. Initial encounter. EXAM: CHEST  2 VIEW COMPARISON:  Chest radiograph and CTA of the chest performed 12/30/2014 FINDINGS: The lungs are well-aerated. Mild vascular congestion is noted, with mild bibasilar atelectasis. There is no evidence of pleural effusion or pneumothorax. The heart is borderline normal in size. No acute osseous abnormalities are seen. IMPRESSION: Mild vascular congestion noted, with mild bibasilar atelectasis. Electronically Signed   By: Garald Balding M.D.   On: 01/11/2015 22:09   Dg Chest 2 View  12/30/2014  CLINICAL DATA:  Acute onset of central chest pain, radiating to the shoulders. Initial encounter. EXAM: CHEST  2 VIEW COMPARISON:  Chest radiograph performed 08/10/2013 FINDINGS: The lungs are well-aerated. A small calcified granuloma is suggested at the right midlung zone. There is no evidence of focal opacification, pleural effusion or pneumothorax. The heart is normal in size; the mediastinal contour is within normal limits. No acute osseous abnormalities are seen. A left shoulder arthroplasty is partially imaged. IMPRESSION: No acute cardiopulmonary process seen. Electronically Signed   By: Garald Balding M.D.   On: 12/30/2014  02:08   Nm Myocar Multi W/spect W/wall Motion / Ef  01/14/2015   There was no ST segment deviation noted during stress.  This is a low risk study.  No ischemia.  PVC's noted.  Overall reassuring, low risk study. SKAINS, MARK, MD   Ct Angio Chest Aorta W/cm &/or Wo/cm  12/30/2014  CLINICAL DATA:  Central chest pain and pressure radiating to shoulders. History of atrial fibrillation, into coagulation, hypertension, hyperlipidemia. EXAM: CT ANGIOGRAPHY CHEST WITH CONTRAST TECHNIQUE: Multidetector CT imaging of the chest was performed using the standard protocol during bolus administration of intravenous contrast. Multiplanar CT image reconstructions and MIPs were obtained to evaluate the vascular anatomy. CONTRAST:  144mL OMNIPAQUE IOHEXOL 350 MG/ML SOLN COMPARISON:  Chest radiograph December 30, 2014 FINDINGS: No abnormal density along the course of the thoracic aorta on precontrast imaging. Thoracic aorta is normal in course caliber. Mild calcific atherosclerosis of the aortic arch. No dissection, luminal irregularity, aneurysm, periaortic fluid collection or contrast extravasation. Though not tailored for evaluation, no pulmonary embolism. No lymphadenopathy by CT size criteria. The heart is mildly enlarged. Minimal coronary artery calcifications. No pericardial effusions. 2 mm RIGHT upper lobe, 2 mm RIGHT upper lobe pulmonary nodule. No pleural effusion or focal consolidation. No pleural effusion. Dependent atelectasis. Tracheobronchial tree is patent and midline. No pneumothorax. Mildly elevated RIGHT hemidiaphragm. 13 mm low-density lesion in the spleen may represent lymphangioma or cyst. Thickened adrenal glands without discrete nodule. 2.2 cm RIGHT upper pole renal cyst. Status post LEFT shoulder arthroplasty. Minimal degenerative change, less than expected for age. IMPRESSION: Mild cardiomegaly.  No acute pulmonary process. Mild calcific atherosclerosis of the aortic arch, no acute vascular process.  Two 2 mm RIGHT upper lobe pulmonary nodules. If the patient is at high risk for bronchogenic carcinoma, follow-up chest CT at 1 year is recommended. If the patient is at low risk, no  follow-up is needed. This recommendation follows the consensus statement: Guidelines for Management of Small Pulmonary Nodules Detected on CT Scans: A Statement from the [place] as published in Radiology 2005; 237:395-400. Electronically Signed   By: Elon Alas M.D.   On: 12/30/2014 05:39   US Abdomen Limited Ruq  01/11/2015  CLINICAL DATA:  Right upper quadrant pain for 1 day. Associated nausea and vomiting. EXAM: US ABDOMEN LIMITED - RIGHT UPPER QUADRANT COMPARISON:  CT of the abdomen pelvis dated 08/04/2011 FINDINGS: Gallbladder: There are 2 mobile gallstones measuring less than 1.5 cm. No gallbladder with wall thickening visualized, gallbladder wall measures 2.7 mm. No sonographic Murphy sign noted by sonographer. Common bile duct: Diameter: 5.3 mm Liver: No focal lesion identified. Within normal limits in parenchymal echogenicity. IMPRESSION: Cholelithiasis without sonographic evidence of acute cholecystitis. Electronically Signed   By: Fidela Salisbury M.D.   On: 01/11/2015 22:46    Microbiology: Recent Results (from the past 240 hour(s))  C difficile quick scan w PCR reflex     Status: Abnormal   Collection Time: 01/14/15  1:03 PM  Result Value Ref Range Status   C Diff antigen POSITIVE (A) NEGATIVE Final   C Diff toxin POSITIVE (A) NEGATIVE Final   C Diff interpretation Positive for toxigenic C. difficile  Final    Comment: CRITICAL RESULT CALLED TO, READ BACK BY AND VERIFIED WITH: LEWIS RN 15:50 01/14/15 ([email or web address])   Surgical pcr screen     Status: None   Collection Time: 01/15/15  3:45 AM  Result Value Ref Range Status   MRSA, PCR NEGATIVE NEGATIVE Final   Staphylococcus aureus NEGATIVE NEGATIVE Final    Comment:        The Xpert SA Assay (FDA approved for NASAL specimens in patients  over 75 years of age), is one component of a comprehensive surveillance program.  Test performance has been validated by [facility] for patients greater than or equal to 34 year old. It is not intended to diagnose infection nor to guide or monitor treatment.      Labs: Basic Metabolic Panel:  Recent Labs Lab 01/12/15 0710 01/13/15 0405 01/14/15 0430 01/15/15 0352 01/16/15 0700  NA 127* 127* 132* 131* 131*  K 4.0 4.7 4.1 4.1 4.2  CL 91* 92* 96* 95* 94*  CO2 26 26 26 25 29   GLUCOSE 111* 98 88 84 95  BUN <5* 7 8 7 9   CREATININE 0.56 0.62 0.69 0.56 0.73  CALCIUM 8.3* 8.3* 8.3* 8.4* 8.4*   Liver Function Tests:  Recent Labs Lab 01/11/15 2101 01/12/15 0710  AST 25 24  ALT 19 16  ALKPHOS 64 59  BILITOT 0.2* 0.4  PROT 6.7 6.3*  ALBUMIN 3.2* 2.9*    Recent Labs Lab 01/11/15 2101  LIPASE 26   No results for input(s): AMMONIA in the last 168 hours. CBC:  Recent Labs Lab 01/12/15 0710 01/13/15 0405 01/14/15 0430 01/15/15 0352 01/16/15 0700  WBC 5.8 5.2 6.1 6.2 7.1  HGB 11.3* 11.4* 12.0 11.8* 11.4*  HCT 34.0* 34.1* 36.2 35.7* 34.4*  MCV 91.2 90.7 92.3 91.3 91.5  PLT 321 350 385 383 380   Cardiac Enzymes:  Recent Labs Lab 01/12/15 0122 01/12/15 0710 01/12/15 1430  TROPONINI 0.12* 0.07* 0.05*   BNP: BNP (last 3 results)  Recent Labs  01/12/15 0122  BNP 273.1*    ProBNP (last 3 results) No results for input(s): PROBNP in the last 8760 hours.  CBG: No results for input(s): GLUCAP in the  last 168 hours.     SignedCristal Ford  [facility] 01/16/2015, 10:45 AM

## 2015-01-16 NOTE — Progress Notes (Signed)
Patient Name: ONELLA PRIOLEAU Date of Encounter: 01/16/2015     Principal Problem:   Epigastric pain Active Problems:   UNSPECIFIED ANEMIA   Atrial fibrillation (HCC)   Hypothyroidism   HTN (hypertension)   Elevated troponin   Hyponatremia   Rectal bleeding   Epigastric abdominal pain   External hemorrhoid, bleeding   Emesis    SUBJECTIVE  The patient is doing well post-cholecystectomy yesterday. Good bowel sounds. Afebrile. WBC normal. Telemetry shows stable NSR on flecainide and atenolol.  CURRENT MEDS . atenolol  25 mg Oral Daily  . flecainide  100 mg Oral BID  . furosemide  20 mg Oral Daily  . gabapentin  100 mg Oral q morning - 10a  . levothyroxine  50 mcg Oral QAC breakfast  . lisinopril  10 mg Oral Daily  . multivitamin  1 tablet Oral Q supper  . pantoprazole  40 mg Oral QAC breakfast  . potassium chloride SA  20 mEq Oral Daily  . sodium chloride  3 mL Intravenous Q12H  . vancomycin  125 mg Oral 4 times per day    OBJECTIVE  Filed Vitals:   01/15/15 1047 01/15/15 1123 01/15/15 2119 01/16/15 0610  BP:  134/54 111/52 120/57  Pulse:  62 55 58  Temp: 97.4 F (36.3 C) 98.4 F (36.9 C) 98.2 F (36.8 C) 98.2 F (36.8 C)  TempSrc:  Tympanic    Resp:  16 16 16   Height:      Weight:    138 lb 10.7 oz (62.9 kg)  SpO2:  94% 95% 92%    Intake/Output Summary (Last 24 hours) at 01/16/15 0912 Last data filed at 01/16/15 0854  Gross per 24 hour  Intake 927.85 ml  Output   1055 ml  Net -127.15 ml   Filed Weights   01/13/15 0446 01/14/15 0436 01/16/15 0610  Weight: 139 lb 9.6 oz (63.322 kg) 138 lb 7.2 oz (62.8 kg) 138 lb 10.7 oz (62.9 kg)    PHYSICAL EXAM  General: Pleasant, NAD. Neuro: Alert and oriented X 3. Moves all extremities spontaneously. Psych: Normal affect. HEENT:  Normal  Neck: Supple without bruits or JVD. Lungs:  Resp regular and unlabored, CTA. Heart: RRR no s3, s4, or murmurs. Abdomen: Soft, non-tender, non-distended, BS + x 4. Good  bowel sounds.  Extremities: No clubbing, cyanosis or edema. DP/PT/Radials 2+ and equal bilaterally.  Accessory Clinical Findings  CBC  Recent Labs  01/15/15 0352 01/16/15 0700  WBC 6.2 7.1  HGB 11.8* 11.4*  HCT 35.7* 34.4*  MCV 91.3 91.5  PLT 383 [id number]   Basic Metabolic Panel  Recent Labs  01/15/15 0352 01/16/15 0700  NA 131* 131*  K 4.1 4.2  CL 95* 94*  CO2 25 29  GLUCOSE 84 95  BUN 7 9  CREATININE 0.56 0.73  CALCIUM 8.4* 8.4*   Liver Function Tests No results for input(s): AST, ALT, ALKPHOS, BILITOT, PROT, ALBUMIN in the last 72 hours. No results for input(s): LIPASE, AMYLASE in the last 72 hours. Cardiac Enzymes No results for input(s): CKTOTAL, CKMB, CKMBINDEX, TROPONINI in the last 72 hours. BNP Invalid input(s): POCBNP D-Dimer No results for input(s): DDIMER in the last 72 hours. Hemoglobin A1C No results for input(s): HGBA1C in the last 72 hours. Fasting Lipid Panel No results for input(s): CHOL, HDL, LDLCALC, TRIG, CHOLHDL, LDLDIRECT in the last 72 hours. Thyroid Function Tests No results for input(s): TSH, T4TOTAL, T3FREE, THYROIDAB in the last 72 hours.  Invalid input(s): FREET3  TELE  NSR  ECG    Radiology/Studies  Dg Chest 2 View  01/11/2015  CLINICAL DATA:  Acute onset of epigastric abdominal pain and vomiting. Generalized weakness. Subacute onset of cough, and acute rectal bleeding. Initial encounter. EXAM: CHEST  2 VIEW COMPARISON:  Chest radiograph and CTA of the chest performed 12/30/2014 FINDINGS: The lungs are well-aerated. Mild vascular congestion is noted, with mild bibasilar atelectasis. There is no evidence of pleural effusion or pneumothorax. The heart is borderline normal in size. No acute osseous abnormalities are seen. IMPRESSION: Mild vascular congestion noted, with mild bibasilar atelectasis. Electronically Signed   By: Garald Balding M.D.   On: 01/11/2015 22:09   Dg Chest 2 View  12/30/2014  CLINICAL DATA:  Acute onset of  central chest pain, radiating to the shoulders. Initial encounter. EXAM: CHEST  2 VIEW COMPARISON:  Chest radiograph performed 08/10/2013 FINDINGS: The lungs are well-aerated. A small calcified granuloma is suggested at the right midlung zone. There is no evidence of focal opacification, pleural effusion or pneumothorax. The heart is normal in size; the mediastinal contour is within normal limits. No acute osseous abnormalities are seen. A left shoulder arthroplasty is partially imaged. IMPRESSION: No acute cardiopulmonary process seen. Electronically Signed   By: Garald Balding M.D.   On: 12/30/2014 02:08   Nm Myocar Multi W/spect W/wall Motion / Ef  01/14/2015   There was no ST segment deviation noted during stress.  This is a low risk study.  No ischemia.  PVC's noted.  Overall reassuring, low risk study. SKAINS, MARK, MD   Ct Angio Chest Aorta W/cm &/or Wo/cm  12/30/2014  CLINICAL DATA:  Central chest pain and pressure radiating to shoulders. History of atrial fibrillation, into coagulation, hypertension, hyperlipidemia. EXAM: CT ANGIOGRAPHY CHEST WITH CONTRAST TECHNIQUE: Multidetector CT imaging of the chest was performed using the standard protocol during bolus administration of intravenous contrast. Multiplanar CT image reconstructions and MIPs were obtained to evaluate the vascular anatomy. CONTRAST:  152mL OMNIPAQUE IOHEXOL 350 MG/ML SOLN COMPARISON:  Chest radiograph December 30, 2014 FINDINGS: No abnormal density along the course of the thoracic aorta on precontrast imaging. Thoracic aorta is normal in course caliber. Mild calcific atherosclerosis of the aortic arch. No dissection, luminal irregularity, aneurysm, periaortic fluid collection or contrast extravasation. Though not tailored for evaluation, no pulmonary embolism. No lymphadenopathy by CT size criteria. The heart is mildly enlarged. Minimal coronary artery calcifications. No pericardial effusions. 2 mm RIGHT upper lobe, 2 mm RIGHT  upper lobe pulmonary nodule. No pleural effusion or focal consolidation. No pleural effusion. Dependent atelectasis. Tracheobronchial tree is patent and midline. No pneumothorax. Mildly elevated RIGHT hemidiaphragm. 13 mm low-density lesion in the spleen may represent lymphangioma or cyst. Thickened adrenal glands without discrete nodule. 2.2 cm RIGHT upper pole renal cyst. Status post LEFT shoulder arthroplasty. Minimal degenerative change, less than expected for age. IMPRESSION: Mild cardiomegaly.  No acute pulmonary process. Mild calcific atherosclerosis of the aortic arch, no acute vascular process. Two 2 mm RIGHT upper lobe pulmonary nodules. If the patient is at high risk for bronchogenic carcinoma, follow-up chest CT at 1 year is recommended. If the patient is at low risk, no follow-up is needed. This recommendation follows the consensus statement: Guidelines for Management of Small Pulmonary Nodules Detected on CT Scans: A Statement from the [place] as published in Radiology 2005; 237:395-400. Electronically Signed   By: Elon Alas M.D.   On: 12/30/2014 05:39   US Abdomen Limited Ruq  01/11/2015  CLINICAL DATA:  Right upper quadrant pain for 1 day. Associated nausea and vomiting. EXAM: US ABDOMEN LIMITED - RIGHT UPPER QUADRANT COMPARISON:  CT of the abdomen pelvis dated 08/04/2011 FINDINGS: Gallbladder: There are 2 mobile gallstones measuring less than 1.5 cm. No gallbladder with wall thickening visualized, gallbladder wall measures 2.7 mm. No sonographic Murphy sign noted by sonographer. Common bile duct: Diameter: 5.3 mm Liver: No focal lesion identified. Within normal limits in parenchymal echogenicity. IMPRESSION: Cholelithiasis without sonographic evidence of acute cholecystitis. Electronically Signed   By: Fidela Salisbury M.D.   On: 01/11/2015 22:46    ASSESSMENT AND PLAN  1. Status post cholecystectomy.Doing well. 2. Paroxysmal atrial fibrillation, maintaining normal  sinus rhythm. Currently off warfarin which will need to be restarted postoperatively when safe from a surgical standpoint. 3. Asymptomatic PVCs. Continue flecainide and atenolol. 4. Essential hypertension. Controlled. Continue atenolol and lisinopril 5. Hypothyroidism, per primary  Plan: okay for discharge from cardiology standpoint. She will need followup appt for INR at [facility] next week. Followup with Dr. Percival Spanish as scheduled (she sees him at [facility].) Signed, Warren Danes MD

## 2015-01-16 NOTE — Progress Notes (Signed)
Occupational Therapy Treatment and Discharge Patient Details Name: Rudie N Yannuzzi MRN: [id number] DOB: 11/19/1932 Today's Date: 01/16/2015    History of present illness Toniyah N Ziemba is an 82 y.o. female with a past medical history significant for pAF on flecainide and warfarin, TIA, bilateral shoulder surgeries, and bilateral TKA, anemia, osteoarthritis, HTN, hypothyroidism, and GERD who presents with worsening epigastric pain for two weeks.01/15/15 S/P LAPAROSCOPIC CHOLECYSTECTOMY (   OT comments  This 82 yo female admitted with above presents to acute OT this session with all goals and education met. We will D/C from acute OT.  Follow Up Recommendations  No OT follow up;Supervision - Intermittent    Equipment Recommendations  None recommended by OT       Precautions / Restrictions Precautions Precautions: Fall Precaution Comments: pt has had multiple falls at home and her husband has had mutliple falls recently as well Restrictions Weight Bearing Restrictions: No       Mobility Bed Mobility Overal bed mobility: Modified Independent             General bed mobility comments: increased time since sx yesterday  Transfers Overall transfer level: Modified independent   Transfers: Sit to/from Stand Sit to Stand: Modified independent (Device/Increase time) (increased time due to pain from sx yesterday)                  ADL Overall ADL's : Needs assistance/impaired             Lower Body Bathing: Sit to/from stand;Modified independent (can cross legs to ge to feet)           Toilet Transfer: Modified Independent;Ambulation (no AD)         Tub/Shower Transfer Details (indicate cue type and reason): I discussed with pt and husband: they have a tub shower combo with door and a tub bench that was given to them. Tub bench will not work due to door so pt will either consider buying a tub seat or waiting unitl she feels strond enough to shower standing up. She  reports she has a hand held shower head and grab bars in tub/shower combo                    Cognition   Behavior During Therapy: WFL for tasks assessed/performed Overall Cognitive Status: Within Functional Limits for tasks assessed                         Exercises Other Exercises Other Exercises: Issued pt a piece of Level 1 theraband and instructed her verbally as well as with written instructions on 4 exercises. She has limited shoulder movement on left due to old shoulder issues so had to modifiy how she did exercises. She returned demonstrated 10 reps of each of 4 exercises.       Pertinent Vitals/ Pain       Pain Assessment: 0-10 Pain Score: 2  Pain Location: stomach post sx Pain Descriptors / Indicators: Sore Pain Intervention(s): Monitored during session;Repositioned         Frequency Min 2X/week     Progress Toward Goals  OT Goals(current goals can now be found in the care plan section)  Progress towards OT goals: Goals met/education completed, patient discharged from OT     Plan Discharge plan remains appropriate       End of Session Equipment Utilized During Treatment:  (none)   Activity Tolerance Patient tolerated treatment well     Patient Left in bed;with call bell/phone within reach;with family/visitor present   Nurse Communication      Functional Assessment Tool Used: clinical observation Functional Limitation: Self care Self Care Current Status (G8987): At least 1 percent but less than 20 percent impaired, limited or restricted Self Care Goal Status (G8988): At least 1 percent but less than 20 percent impaired, limited or restricted Self Care Discharge Status (G8989): At least 1 percent but less than 20 percent impaired, limited or restricted   Time: 1027-1059 OT Time Calculation (min): 32 min  Charges: OT G-codes **NOT FOR INPATIENT CLASS** Functional Assessment Tool Used: clinical observation Functional Limitation: Self  care Self Care Current Status (G8987): At least 1 percent but less than 20 percent impaired, limited or restricted Self Care Goal Status (G8988): At least 1 percent but less than 20 percent impaired, limited or restricted Self Care Discharge Status (G8989): At least 1 percent but less than 20 percent impaired, limited or restricted OT General Charges $OT Visit: 1 Procedure OT Treatments $Self Care/Home Management : 8-22 mins $Therapeutic Exercise: 8-22 mins  ,  Eva [phone] 01/16/2015, 11:12 AM    

## 2015-01-17 ENCOUNTER — Other Ambulatory Visit: Payer: Self-pay | Admitting: Family Medicine

## 2015-01-20 DIAGNOSIS — Z79891 Long term (current) use of opiate analgesic: Secondary | ICD-10-CM | POA: Diagnosis not present

## 2015-01-20 DIAGNOSIS — I10 Essential (primary) hypertension: Secondary | ICD-10-CM | POA: Diagnosis not present

## 2015-01-20 DIAGNOSIS — Z792 Long term (current) use of antibiotics: Secondary | ICD-10-CM | POA: Diagnosis not present

## 2015-01-20 DIAGNOSIS — Z7901 Long term (current) use of anticoagulants: Secondary | ICD-10-CM | POA: Diagnosis not present

## 2015-01-20 DIAGNOSIS — K8051 Calculus of bile duct without cholangitis or cholecystitis with obstruction: Secondary | ICD-10-CM | POA: Diagnosis not present

## 2015-01-20 DIAGNOSIS — E039 Hypothyroidism, unspecified: Secondary | ICD-10-CM | POA: Diagnosis not present

## 2015-01-20 DIAGNOSIS — I480 Paroxysmal atrial fibrillation: Secondary | ICD-10-CM | POA: Diagnosis not present

## 2015-01-22 ENCOUNTER — Ambulatory Visit (INDEPENDENT_AMBULATORY_CARE_PROVIDER_SITE_OTHER): Payer: Medicare Other | Admitting: Pharmacist

## 2015-01-22 DIAGNOSIS — Z79891 Long term (current) use of opiate analgesic: Secondary | ICD-10-CM | POA: Diagnosis not present

## 2015-01-22 DIAGNOSIS — I480 Paroxysmal atrial fibrillation: Secondary | ICD-10-CM | POA: Diagnosis not present

## 2015-01-22 DIAGNOSIS — Z792 Long term (current) use of antibiotics: Secondary | ICD-10-CM | POA: Diagnosis not present

## 2015-01-22 DIAGNOSIS — E039 Hypothyroidism, unspecified: Secondary | ICD-10-CM | POA: Diagnosis not present

## 2015-01-22 DIAGNOSIS — Z7901 Long term (current) use of anticoagulants: Secondary | ICD-10-CM | POA: Diagnosis not present

## 2015-01-22 DIAGNOSIS — K8051 Calculus of bile duct without cholangitis or cholecystitis with obstruction: Secondary | ICD-10-CM | POA: Diagnosis not present

## 2015-01-22 DIAGNOSIS — I10 Essential (primary) hypertension: Secondary | ICD-10-CM | POA: Diagnosis not present

## 2015-01-22 LAB — POCT INR: INR: 1.6

## 2015-01-22 NOTE — Patient Instructions (Signed)
Anticoagulation Dose Instructions as of 01/22/2015      [name] Tue Wed Thu Fri Sat   New Dose 5 mg 5 mg 5 mg 5 mg 5 mg 5 mg 5 mg    Description        Take extra 1/2 tablet today - Wednesday, January 18th.  Then continue current warfarin dose of 5mg  -  1 tablet once each day.     INR was 1.6 today (too thick)

## 2015-01-27 ENCOUNTER — Encounter: Payer: Self-pay | Admitting: Family Medicine

## 2015-01-27 ENCOUNTER — Ambulatory Visit (INDEPENDENT_AMBULATORY_CARE_PROVIDER_SITE_OTHER): Payer: Medicare Other | Admitting: Family Medicine

## 2015-01-27 VITALS — BP 145/72 | HR 64 | Temp 97.7°F | Ht 63.0 in | Wt 141.2 lb

## 2015-01-27 DIAGNOSIS — I480 Paroxysmal atrial fibrillation: Secondary | ICD-10-CM

## 2015-01-27 DIAGNOSIS — H60391 Other infective otitis externa, right ear: Secondary | ICD-10-CM | POA: Diagnosis not present

## 2015-01-27 DIAGNOSIS — A047 Enterocolitis due to Clostridium difficile: Secondary | ICD-10-CM | POA: Diagnosis not present

## 2015-01-27 DIAGNOSIS — A0472 Enterocolitis due to Clostridium difficile, not specified as recurrent: Secondary | ICD-10-CM | POA: Insufficient documentation

## 2015-01-27 DIAGNOSIS — L97511 Non-pressure chronic ulcer of other part of right foot limited to breakdown of skin: Secondary | ICD-10-CM | POA: Diagnosis not present

## 2015-01-27 DIAGNOSIS — Z7901 Long term (current) use of anticoagulants: Secondary | ICD-10-CM

## 2015-01-27 DIAGNOSIS — D649 Anemia, unspecified: Secondary | ICD-10-CM | POA: Diagnosis not present

## 2015-01-27 LAB — POCT INR: INR: 2.3

## 2015-01-27 MED ORDER — CIPROFLOXACIN-DEXAMETHASONE 0.3-0.1 % OT SUSP: 4.0000 [drp] | Freq: Two times a day (BID) | OTIC | Status: DC

## 2015-01-27 NOTE — Patient Instructions (Addendum)
Great to see you!  Lets see you back in 1 month, please call or come back if you need [place] sooner.   Continue seeing Dr. Irving Shows, Continue the mupirocin ointment on your toe, its ok to stop on the nose.   I have sent ear drops to see if we can gets some relief in your ears

## 2015-01-27 NOTE — Progress Notes (Signed)
   HPI  Patient presents today for hospital follow-up.  Patient was admitted to the hospital for atypical chest pain, abdominal pain found to be acute cholecystitis. She has been found to have C. difficile enteritis which is being treated with oral vancomycin, she still being treated.  She states she still having several loose stools daily, however has improved since leaving hospital.  Her abdominal pain that was attributed to acute cholecystitis has improved since her surgery.  Her INR was subtherapeutic last week, is now therapeutic. He has no missed doses.  She continues to have some right ear pain and itching She has seen a podiatrist for her left second toe ulcer and it is improving She has completed 6 days of nasal mupirocin   PMH: Smoking status noted ROS: Per HPI  Objective: BP 145/72 mmHg  Pulse 64  Temp(Src) 97.7 F (36.5 C) (Oral)  Ht 5\' 3"  (1.6 m)  Wt 141 lb 3.2 oz (64.048 kg)  BMI 25.02 kg/m2 Gen: NAD, alert, cooperative with exam HEENT: NCAT, right year canal with some erythema and irritation, right TM and left TM normal CV: RRR, good S1/S2, no murmur Resp: CTABL, no wheezes, non-labored Ext: No edema, warm Neuro: Alert and oriented, No gross deficits  Assessment and plan:  # Clostridium difficile enteritis Continue oral vancomycin Add metamucil and fiber to diet for continued diarrhea If she has worsening diarrhea or continued diarrhea after her course is complete we will consider rechecking Minimize antibiotics  # Ear pain, otitis externa Ciprodex   # Abd pain Improved after chole, discussed usal aftercare and expectations   # HTN Slightly elevated, no changes with current diarrhea   Meds ordered this encounter  Medications  . gabapentin (NEURONTIN) 300 MG capsule    Sig: 1 tab three times a day thereafter    Laroy Apple, MD [facility] 01/27/2015, 10:58 AM

## 2015-01-28 ENCOUNTER — Other Ambulatory Visit: Payer: Self-pay | Admitting: *Deleted

## 2015-01-28 DIAGNOSIS — I480 Paroxysmal atrial fibrillation: Secondary | ICD-10-CM | POA: Diagnosis not present

## 2015-01-28 DIAGNOSIS — K8051 Calculus of bile duct without cholangitis or cholecystitis with obstruction: Secondary | ICD-10-CM | POA: Diagnosis not present

## 2015-01-28 DIAGNOSIS — Z79891 Long term (current) use of opiate analgesic: Secondary | ICD-10-CM | POA: Diagnosis not present

## 2015-01-28 DIAGNOSIS — Z7901 Long term (current) use of anticoagulants: Secondary | ICD-10-CM | POA: Diagnosis not present

## 2015-01-28 DIAGNOSIS — I10 Essential (primary) hypertension: Secondary | ICD-10-CM | POA: Diagnosis not present

## 2015-01-28 DIAGNOSIS — E039 Hypothyroidism, unspecified: Secondary | ICD-10-CM | POA: Diagnosis not present

## 2015-01-28 DIAGNOSIS — Z792 Long term (current) use of antibiotics: Secondary | ICD-10-CM | POA: Diagnosis not present

## 2015-01-28 LAB — CBC WITH DIFFERENTIAL/PLATELET
BASOS ABS: 0.1 10*3/uL (ref 0.0–0.2)
Basos: 2 %
EOS (ABSOLUTE): 0.4 10*3/uL (ref 0.0–0.4)
EOS: 7 %
HEMATOCRIT: 38.3 % (ref 34.0–46.6)
HEMOGLOBIN: 12.5 g/dL (ref 11.1–15.9)
IMMATURE GRANS (ABS): 0 10*3/uL (ref 0.0–0.1)
Immature Granulocytes: 0 %
LYMPHS: 25 %
Lymphocytes Absolute: 1.3 10*3/uL (ref 0.7–3.1)
MCH: 30 pg (ref 26.6–33.0)
MCHC: 32.6 g/dL (ref 31.5–35.7)
MCV: 92 fL (ref 79–97)
MONOCYTES: 12 %
Monocytes Absolute: 0.6 10*3/uL (ref 0.1–0.9)
NEUTROS ABS: 2.9 10*3/uL (ref 1.4–7.0)
Neutrophils: 54 %
Platelets: 401 10*3/uL — ABNORMAL HIGH (ref 150–379)
RBC: 4.17 x10E6/uL (ref 3.77–5.28)
RDW: 13.7 % (ref 12.3–15.4)
WBC: 5.3 10*3/uL (ref 3.4–10.8)

## 2015-01-28 LAB — CMP14+EGFR
ALBUMIN: 3.9 g/dL (ref 3.5–4.7)
ALT: 11 IU/L (ref 0–32)
AST: 17 IU/L (ref 0–40)
Albumin/Globulin Ratio: 1.5 (ref 1.1–2.5)
Alkaline Phosphatase: 78 IU/L (ref 39–117)
BUN / CREAT RATIO: 12 (ref 11–26)
BUN: 8 mg/dL (ref 8–27)
Bilirubin Total: 0.3 mg/dL (ref 0.0–1.2)
CO2: 24 mmol/L (ref 18–29)
CREATININE: 0.66 mg/dL (ref 0.57–1.00)
Calcium: 8.8 mg/dL (ref 8.7–10.3)
Chloride: 93 mmol/L — ABNORMAL LOW (ref 96–106)
GFR, EST AFRICAN AMERICAN: 96 mL/min/{1.73_m2} (ref >59)
GFR, EST NON AFRICAN AMERICAN: 84 mL/min/{1.73_m2} (ref >59)
GLOBULIN, TOTAL: 2.6 g/dL (ref 1.5–4.5)
GLUCOSE: 81 mg/dL (ref 65–99)
Potassium: 4.5 mmol/L (ref 3.5–5.2)
Sodium: 134 mmol/L (ref 134–144)
TOTAL PROTEIN: 6.5 g/dL (ref 6.0–8.5)

## 2015-01-28 MED ORDER — FLECAINIDE ACETATE 100 MG PO TABS: 100.0000 mg | ORAL_TABLET | Freq: Two times a day (BID) | ORAL | Status: DC

## 2015-01-30 DIAGNOSIS — H908 Mixed conductive and sensorineural hearing loss, unspecified: Secondary | ICD-10-CM | POA: Diagnosis not present

## 2015-01-31 DIAGNOSIS — Z792 Long term (current) use of antibiotics: Secondary | ICD-10-CM | POA: Diagnosis not present

## 2015-01-31 DIAGNOSIS — E039 Hypothyroidism, unspecified: Secondary | ICD-10-CM | POA: Diagnosis not present

## 2015-01-31 DIAGNOSIS — I10 Essential (primary) hypertension: Secondary | ICD-10-CM | POA: Diagnosis not present

## 2015-01-31 DIAGNOSIS — I480 Paroxysmal atrial fibrillation: Secondary | ICD-10-CM | POA: Diagnosis not present

## 2015-01-31 DIAGNOSIS — K8051 Calculus of bile duct without cholangitis or cholecystitis with obstruction: Secondary | ICD-10-CM | POA: Diagnosis not present

## 2015-01-31 DIAGNOSIS — Z7901 Long term (current) use of anticoagulants: Secondary | ICD-10-CM | POA: Diagnosis not present

## 2015-01-31 DIAGNOSIS — Z79891 Long term (current) use of opiate analgesic: Secondary | ICD-10-CM | POA: Diagnosis not present

## 2015-02-05 DIAGNOSIS — I10 Essential (primary) hypertension: Secondary | ICD-10-CM | POA: Diagnosis not present

## 2015-02-05 DIAGNOSIS — E039 Hypothyroidism, unspecified: Secondary | ICD-10-CM | POA: Diagnosis not present

## 2015-02-05 DIAGNOSIS — I480 Paroxysmal atrial fibrillation: Secondary | ICD-10-CM | POA: Diagnosis not present

## 2015-02-05 DIAGNOSIS — Z792 Long term (current) use of antibiotics: Secondary | ICD-10-CM | POA: Diagnosis not present

## 2015-02-05 DIAGNOSIS — Z79891 Long term (current) use of opiate analgesic: Secondary | ICD-10-CM | POA: Diagnosis not present

## 2015-02-05 DIAGNOSIS — K8051 Calculus of bile duct without cholangitis or cholecystitis with obstruction: Secondary | ICD-10-CM | POA: Diagnosis not present

## 2015-02-05 DIAGNOSIS — Z7901 Long term (current) use of anticoagulants: Secondary | ICD-10-CM | POA: Diagnosis not present

## 2015-02-07 ENCOUNTER — Emergency Department (HOSPITAL_COMMUNITY)
Admission: EM | Admit: 2015-02-07 | Discharge: 2015-02-07 | Disposition: A | Discharge status: Home / Self Care | Payer: Medicare Other | Attending: Emergency Medicine | Admitting: Emergency Medicine

## 2015-02-07 ENCOUNTER — Emergency Department (HOSPITAL_COMMUNITY): Payer: Medicare Other

## 2015-02-07 ENCOUNTER — Encounter (HOSPITAL_COMMUNITY): Payer: Self-pay | Admitting: Emergency Medicine

## 2015-02-07 DIAGNOSIS — I10 Essential (primary) hypertension: Secondary | ICD-10-CM | POA: Diagnosis not present

## 2015-02-07 DIAGNOSIS — E785 Hyperlipidemia, unspecified: Secondary | ICD-10-CM | POA: Diagnosis not present

## 2015-02-07 DIAGNOSIS — M1990 Unspecified osteoarthritis, unspecified site: Secondary | ICD-10-CM | POA: Insufficient documentation

## 2015-02-07 DIAGNOSIS — R112 Nausea with vomiting, unspecified: Secondary | ICD-10-CM | POA: Insufficient documentation

## 2015-02-07 DIAGNOSIS — R531 Weakness: Secondary | ICD-10-CM | POA: Insufficient documentation

## 2015-02-07 DIAGNOSIS — G4700 Insomnia, unspecified: Secondary | ICD-10-CM | POA: Diagnosis not present

## 2015-02-07 DIAGNOSIS — Z792 Long term (current) use of antibiotics: Secondary | ICD-10-CM | POA: Diagnosis not present

## 2015-02-07 DIAGNOSIS — Z880 Allergy status to penicillin: Secondary | ICD-10-CM | POA: Insufficient documentation

## 2015-02-07 DIAGNOSIS — Z8673 Personal history of transient ischemic attack (TIA), and cerebral infarction without residual deficits: Secondary | ICD-10-CM | POA: Diagnosis not present

## 2015-02-07 DIAGNOSIS — Z79899 Other long term (current) drug therapy: Secondary | ICD-10-CM | POA: Diagnosis not present

## 2015-02-07 DIAGNOSIS — R197 Diarrhea, unspecified: Secondary | ICD-10-CM | POA: Insufficient documentation

## 2015-02-07 DIAGNOSIS — R55 Syncope and collapse: Secondary | ICD-10-CM

## 2015-02-07 DIAGNOSIS — K5730 Diverticulosis of large intestine without perforation or abscess without bleeding: Secondary | ICD-10-CM | POA: Diagnosis not present

## 2015-02-07 DIAGNOSIS — R05 Cough: Secondary | ICD-10-CM | POA: Diagnosis not present

## 2015-02-07 DIAGNOSIS — Z7952 Long term (current) use of systemic steroids: Secondary | ICD-10-CM | POA: Insufficient documentation

## 2015-02-07 DIAGNOSIS — Z8619 Personal history of other infectious and parasitic diseases: Secondary | ICD-10-CM | POA: Insufficient documentation

## 2015-02-07 DIAGNOSIS — E039 Hypothyroidism, unspecified: Secondary | ICD-10-CM | POA: Diagnosis not present

## 2015-02-07 DIAGNOSIS — Z791 Long term (current) use of non-steroidal anti-inflammatories (NSAID): Secondary | ICD-10-CM | POA: Diagnosis not present

## 2015-02-07 DIAGNOSIS — Z7951 Long term (current) use of inhaled steroids: Secondary | ICD-10-CM | POA: Diagnosis not present

## 2015-02-07 DIAGNOSIS — I480 Paroxysmal atrial fibrillation: Secondary | ICD-10-CM | POA: Diagnosis not present

## 2015-02-07 DIAGNOSIS — R404 Transient alteration of awareness: Secondary | ICD-10-CM | POA: Diagnosis not present

## 2015-02-07 DIAGNOSIS — K921 Melena: Secondary | ICD-10-CM | POA: Diagnosis not present

## 2015-02-07 DIAGNOSIS — Z7901 Long term (current) use of anticoagulants: Secondary | ICD-10-CM | POA: Diagnosis not present

## 2015-02-07 DIAGNOSIS — Z862 Personal history of diseases of the blood and blood-forming organs and certain disorders involving the immune mechanism: Secondary | ICD-10-CM | POA: Insufficient documentation

## 2015-02-07 LAB — COMPREHENSIVE METABOLIC PANEL
ALBUMIN: 3.5 g/dL (ref 3.5–5.0)
ALT: 13 U/L — ABNORMAL LOW (ref 14–54)
ANION GAP: 8 (ref 5–15)
AST: 23 U/L (ref 15–41)
Alkaline Phosphatase: 60 U/L (ref 38–126)
BILIRUBIN TOTAL: 0.4 mg/dL (ref 0.3–1.2)
BUN: 11 mg/dL (ref 6–20)
CHLORIDE: 99 mmol/L — AB (ref 101–111)
CO2: 28 mmol/L (ref 22–32)
Calcium: 8.5 mg/dL — ABNORMAL LOW (ref 8.9–10.3)
Creatinine, Ser: 0.73 mg/dL (ref 0.44–1.00)
GFR calc Af Amer: >60 mL/min (ref >60)
GFR calc non Af Amer: >60 mL/min (ref >60)
GLUCOSE: 104 mg/dL — AB (ref 65–99)
POTASSIUM: 4.2 mmol/L (ref 3.5–5.1)
SODIUM: 135 mmol/L (ref 135–145)
TOTAL PROTEIN: 6.6 g/dL (ref 6.5–8.1)

## 2015-02-07 LAB — CBC WITH DIFFERENTIAL/PLATELET
BASOS ABS: 0 10*3/uL (ref 0.0–0.1)
BASOS PCT: 1 %
EOS ABS: 0.3 10*3/uL (ref 0.0–0.7)
EOS PCT: 5 %
HEMATOCRIT: 38 % (ref 36.0–46.0)
Hemoglobin: 12.2 g/dL (ref 12.0–15.0)
Lymphocytes Relative: 22 %
Lymphs Abs: 1.3 10*3/uL (ref 0.7–4.0)
MCH: 30.3 pg (ref 26.0–34.0)
MCHC: 32.1 g/dL (ref 30.0–36.0)
MCV: 94.5 fL (ref 78.0–100.0)
MONO ABS: 0.5 10*3/uL (ref 0.1–1.0)
MONOS PCT: 8 %
Neutro Abs: 3.9 10*3/uL (ref 1.7–7.7)
Neutrophils Relative %: 64 %
PLATELETS: 262 10*3/uL (ref 150–400)
RBC: 4.02 MIL/uL (ref 3.87–5.11)
RDW: 14.2 % (ref 11.5–15.5)
WBC: 6 10*3/uL (ref 4.0–10.5)

## 2015-02-07 LAB — URINALYSIS, ROUTINE W REFLEX MICROSCOPIC
Bilirubin Urine: NEGATIVE
Glucose, UA: NEGATIVE mg/dL
KETONES UR: NEGATIVE mg/dL
LEUKOCYTES UA: NEGATIVE
NITRITE: NEGATIVE
PH: 7 (ref 5.0–8.0)
PROTEIN: NEGATIVE mg/dL
Specific Gravity, Urine: 1.01 (ref 1.005–1.030)

## 2015-02-07 LAB — URINE MICROSCOPIC-ADD ON
BACTERIA UA: NONE SEEN
WBC UA: NONE SEEN WBC/hpf (ref 0–5)

## 2015-02-07 LAB — LIPASE, BLOOD: LIPASE: 20 U/L (ref 11–51)

## 2015-02-07 MED ORDER — IOHEXOL 300 MG/ML  SOLN
100.0000 mL | Freq: Once | INTRAMUSCULAR | Status: AC | PRN
  Administered 2015-02-07: 100 mL via INTRAVENOUS

## 2015-02-07 MED ORDER — ONDANSETRON HCL 4 MG/2ML IJ SOLN
4.0000 mg | Freq: Once | INTRAMUSCULAR | Status: AC
  Administered 2015-02-07: 4 mg via INTRAVENOUS
  Filled 2015-02-07: qty 2

## 2015-02-07 MED ORDER — DIATRIZOATE MEGLUMINE & SODIUM 66-10 % PO SOLN
15.0000 mL | Freq: Once | ORAL | Status: AC
  Administered 2015-02-07: 30 mL via ORAL

## 2015-02-07 MED ORDER — SODIUM CHLORIDE 0.9 % IV BOLUS (SEPSIS)
500.0000 mL | Freq: Once | INTRAVENOUS | Status: AC
  Administered 2015-02-07: 500 mL via INTRAVENOUS

## 2015-02-07 NOTE — Discharge Instructions (Signed)
Drink plenty of fluids.  Follow up with Dr. Laural Golden

## 2015-02-07 NOTE — ED Provider Notes (Signed)
CSN: [id number]     Arrival date & time 02/07/15  0905 History  By signing my name below, I, Stephania Fragmin, attest that this documentation has been prepared under the direction and in the presence of Milton Ferguson, MD. Electronically Signed: Stephania Fragmin, ED Scribe. 02/07/2015. 9:40 AM.   Chief Complaint  Patient presents with  . Near Syncope   Patient is a 82 y.o. female presenting with near-syncope. The history is provided by the EMS personnel. No language interpreter was used.  Near Syncope The current episode started 1 to 2 hours ago. The problem occurs rarely. The problem has not changed since onset.Pertinent negatives include no chest pain, no abdominal pain and no headaches. Nothing aggravates the symptoms. Nothing relieves the symptoms. She has tried nothing for the symptoms.  HPI Comments: [name] is a 82 y.o. female with a history of paroxysmal atrial fibrillation, CVA, Warfarin anticoagulation, carotid bruit, cardiomegaly, HLD, HTN, PONV, GERD, anemia, and hypothyroidism, brought in by ambulance, who presents to the Emergency Department complaining of a witnessed syncopal episode that occurred this morning. Per EMS, patient was in the kitchen this morning and felt dizzy and weak before she almost fell to the floor, so her husband caught her and helped her to the floor. Her husband states she woke up soon after she was helped down to the floor. Her daughter notes she appeared "pale and clammy." This is a new problem. Her husband states she has had diarrhea and vomiting all last night. Patient notes blood in her diarrhea but states "I always have blood." Patient does not have a gastroenterologist, per husband. Patient is 23 days S/P laparoscopic cholecystectomy, but her husband states she had nausea and vomiting before her recent cholecystectomy. Patient denies any pain.    Past Medical History  Diagnosis Date  . Atrial fibrillation (HCC)     Paroxysmal, Flecainide therapy  . Warfarin  anticoagulation   . Hypothyroidism   . Carotid bruit     Doppler, December, 2009, no abnormality  . Cardiomegaly     EF normal, echo, 2003  //   EF 60-65%, echo, June 18, 123456, diastolic dysfunction,  . Normal nuclear stress test     Normal, 2001  . HLD (hyperlipidemia)   . HTN (hypertension)   . Osteoarthritis   . Calf pain     September, 2012, at rest  . PONV (postoperative nausea and vomiting)   . GERD (gastroesophageal reflux disease)   . Anemia   . Rectal bleeding 2001    diverticulosis and int hemorrhoids on 07/1999 and 02/2010 colonoscopies.  . Hypothyroidism   . Cataract   . Stroke ([place])   . Insomnia   . History of shingles    Past Surgical History  Procedure Laterality Date  . Total knee arthroplasty  05/26/10    right  . Total shoulder replacement  01/2010    left  . Shoulder open rotator cuff repair      bilateral  . Total knee arthroplasty  11/17/2010    Procedure: TOTAL KNEE ARTHROPLASTY;  Surgeon: Mauri Pole;  Location: [facility];  Service: Orthopedics;  Laterality: Left;  . Total knee arthroplasty      Right  . Cataract extraction w/phaco Right 04/15/2014    Procedure: CATARACT EXTRACTION PHACO AND INTRAOCULAR LENS PLACEMENT (IOC);  Surgeon: Tonny Branch, MD;  Location: [facility];  Service: Ophthalmology;  Laterality: Right;  CDE: 13.30  . Cataract extraction w/phaco Left 05/13/2014    Procedure: CATARACT EXTRACTION  PHACO AND INTRAOCULAR LENS PLACEMENT LEFT EYE;  Surgeon: Tonny Branch, MD;  Location: [facility];  Service: Ophthalmology;  Laterality: Left;  CDE:13.00  . Esophagogastroduodenoscopy N/A 01/13/2015    Procedure: ESOPHAGOGASTRODUODENOSCOPY (EGD);  Surgeon: Jerene Bears, MD;  Location: [facility] ENDOSCOPY;  Service: Endoscopy;  Laterality: N/A;  . Cholecystectomy N/A 01/15/2015    Procedure: LAPAROSCOPIC CHOLECYSTECTOMY;  Surgeon: Ralene Ok, MD;  Location: [facility];  Service: General;  Laterality: N/A;   Family History  Problem Relation Age of Onset  . COPD Brother    . Atrial fibrillation Brother   . Diabetes Brother   . Liver cancer Brother     Alcohol-related  . Colon cancer Brother   . Heart attack Father   . Rheum arthritis Mother   . Arthritis Mother   . Heart disease Mother   . Heart attack Daughter   . Coronary artery disease Brother   . Gallbladder disease Brother    Social History  Substance Use Topics  . Smoking status: Never Smoker   . Smokeless tobacco: Never Used  . Alcohol Use: No   OB History    No data available     Review of Systems  Constitutional: Negative for appetite change and fatigue.  HENT: Negative for congestion, ear discharge and sinus pressure.   Eyes: Negative for discharge.  Respiratory: Negative for cough.   Cardiovascular: Positive for near-syncope. Negative for chest pain.  Gastrointestinal: Positive for nausea, vomiting, diarrhea and blood in stool. Negative for abdominal pain.  Genitourinary: Negative for frequency and hematuria.  Musculoskeletal: Negative for back pain.  Skin: Positive for pallor. Negative for rash.  Neurological: Positive for dizziness and weakness. Negative for seizures and headaches.  Psychiatric/Behavioral: Negative for hallucinations.   Allergies  Penicillins  Home Medications   Prior to Admission medications   Medication Sig Start Date End Date Taking? Authorizing Provider  acetaminophen (TYLENOL) 500 MG tablet Take 500 mg by mouth every 6 (six) hours as needed for moderate pain.    Historical Provider, MD  atenolol (TENORMIN) 25 MG tablet Take 25 mg by mouth daily. Reported on 01/27/2015    Historical Provider, MD  ciprofloxacin-dexamethasone (CIPRODEX) otic suspension Place 4 drops into the right ear 2 (two) times daily. 01/27/15   Timmothy Euler, MD  dicyclomine (BENTYL) 10 MG capsule Take 10 mg by mouth 2 (two) times daily as needed for spasms (upset stomach).     Historical Provider, MD  ergocalciferol (VITAMIN D2) 50000 UNITS capsule Take 50,000 Units by mouth once  a week. Reported on 01/27/2015    Historical Provider, MD  flecainide (TAMBOCOR) 100 MG tablet Take 1 tablet (100 mg total) by mouth 2 (two) times daily. 01/28/15   Timmothy Euler, MD  fluticasone (FLONASE) 50 MCG/ACT nasal spray Place 1 spray into both nostrils 2 (two) times daily as needed for allergies or rhinitis.    Historical Provider, MD  furosemide (LASIX) 40 MG tablet Take 0.5-1 tablets (20-40 mg total) by mouth every morning. Patient taking differently: Take 20-40 mg by mouth daily. Take 1/2 tablet (20 mg) by mouth every morning, take 1 tablet (40 mg) if stomach and legs are swollen with fluid 11/25/14   Timmothy Euler, MD  gabapentin (NEURONTIN) 100 MG capsule Take 1 tab in the morning 12/17/14   Sharion Balloon, FNP  gabapentin (NEURONTIN) 300 MG capsule 1 tab three times a day thereafter 08/14/14 08/14/15  Historical Provider, MD  GARLIC PO Take 1 tablet by mouth daily  after supper.     Historical Provider, MD  levothyroxine (SYNTHROID, LEVOTHROID) 50 MCG tablet Take 50 mcg by mouth daily.     Historical Provider, MD  lisinopril (PRINIVIL,ZESTRIL) 10 MG tablet Take 1 tablet (10 mg total) by mouth daily. 01/16/15   Maryann Mikhail, DO  meclizine (ANTIVERT) 25 MG tablet Take 25 mg by mouth daily as needed for dizziness (vertigo). Inner ear    Historical Provider, MD  Multiple Vitamins-Minerals (PRESERVISION AREDS 2) CAPS Take 1 capsule by mouth daily with supper.    Historical Provider, MD  mupirocin ointment (BACTROBAN) 2 % APPLY INTO NOSTRIL(S) 2 TIMES A DAY AS DIRECTED 01/17/15   Timmothy Euler, MD  OVER THE COUNTER MEDICATION Place 1 drop into both ears daily as needed (dryness). Over the counter oily ear drops    Historical Provider, MD  OVER THE COUNTER MEDICATION Place 1 drop into both eyes daily as needed (dry eyes). Lubricating eye drops    Historical Provider, MD  potassium chloride SA (K-DUR,KLOR-CON) 20 MEQ tablet Take 20 mEq by mouth daily with supper.  10/19/12   Historical  Provider, MD  traMADol (ULTRAM) 50 MG tablet Take 1 tablet (50 mg total) by mouth every 8 (eight) hours as needed. Patient taking differently: Take 50 mg by mouth daily.  12/17/14   Sharion Balloon, FNP  vancomycin (VANCOCIN) 50 mg/mL oral solution Take 2.5 mLs (125 mg total) by mouth every 6 (six) hours. Dispense either solution or tabs based on insurance coverage. 01/16/15   Maryann Mikhail, DO  vitamin B-12 (CYANOCOBALAMIN) 500 MCG tablet Take 500 mcg by mouth 3 (three) times a week.     Historical Provider, MD  warfarin (COUMADIN) 5 MG tablet Take 2.5-5 mg by mouth daily. Take 1/2 tablet (2.5 mg) by mouth on Fridays, take 1 tablet (5 mg) on all other days of the week    Historical Provider, MD  zolpidem (AMBIEN) 10 MG tablet Take 0.5-1 tablets (5-10 mg total) by mouth at bedtime as needed for sleep. Take 1/2 to 1 tablet at bedtime as needed Patient taking differently: Take 5 mg by mouth at bedtime.  12/17/14   Christy A Hawks, FNP   BP 161/61 mmHg  Pulse 60  Temp(Src) 97.7 F (36.5 C) (Oral)  Resp 18  Ht 5\' 3"  (1.6 m)  Wt 140 lb (63.504 kg)  BMI 24.81 kg/m2  SpO2 96% Physical Exam  Constitutional: She is oriented to person, place, and time. She appears well-developed.  HENT:  Head: Normocephalic.  Eyes: Conjunctivae and EOM are normal. No scleral icterus.  Neck: Neck supple. No thyromegaly present.  Cardiovascular: Normal rate and regular rhythm.  Exam reveals no gallop and no friction rub.   No murmur heard. Pulmonary/Chest: No stridor. She has no wheezes. She has no rales. She exhibits no tenderness.  Abdominal: She exhibits no distension. There is no tenderness. There is no rebound.  Musculoskeletal: Normal range of motion. She exhibits no edema.  Lymphadenopathy:    She has no cervical adenopathy.  Neurological: She is oriented to person, place, and time. She exhibits normal muscle tone. Coordination normal.  Skin: No rash noted. No erythema.  Psychiatric: She has a normal  mood and affect. Her behavior is normal.  Nursing note and vitals reviewed.   ED Course  Procedures (including critical care time)  DIAGNOSTIC STUDIES: Oxygen Saturation is 96% on RA, normal by my interpretation.    COORDINATION OF CARE: 9:34 AM - Discussed treatment plan with pt  and her family at bedside which includes diagnostic testing. Pt and her family verbalized understanding and agreed to plan.   Labs Review Labs Reviewed - No data to display  Imaging Review No results found. I have personally reviewed and evaluated these images and lab results as part of my medical decision-making.   EKG Interpretation None      MDM   Final diagnoses:  None  The chart was scribed for me under my direct supervision.  I personally performed the history, physical, and medical decision making and all procedures in the evaluation of this patient.Milton Ferguson, MD 02/10/15 2152

## 2015-02-07 NOTE — ED Notes (Signed)
Ems states that pt was in the kitchen this morning and had a syncopal episode and husband helped her to the ground.  When ems arrived, pt was alert and oriented, but had generalized weakness.  Pt has had vomiting and diarrhea for "quite a while now".  Is alert and oriented at this time. Fluids started by ems.

## 2015-02-10 ENCOUNTER — Telehealth (INDEPENDENT_AMBULATORY_CARE_PROVIDER_SITE_OTHER): Payer: Self-pay | Admitting: Internal Medicine

## 2015-02-10 ENCOUNTER — Other Ambulatory Visit: Payer: Self-pay

## 2015-02-10 MED ORDER — FUROSEMIDE 40 MG PO TABS: 20.0000 mg | ORAL_TABLET | Freq: Every day | ORAL | Status: DC

## 2015-02-10 NOTE — Telephone Encounter (Signed)
It appears per this note that the patient was seen for.  Ems states that pt was in the kitchen this morning and had a syncopal episode and husband helped her to the ground. When ems arrived, pt was alert and oriented, but had generalized weakness. Pt has had vomiting and diarrhea for "quite a while now". Is alert and oriented at this time. Fluids started by ems.    I suggest that the patient be given an appointment with Terri,offer on a Tuesday while he is in office.

## 2015-02-10 NOTE — Telephone Encounter (Signed)
[name] left a message on the voice mail saying she was seen at [facility] over the weekend and was told to schedule an appointment with Dr. Laural Golden but she didn't say why. She'd like a phone call regarding this.   Pt's ph# [phone] Thank you.

## 2015-02-10 NOTE — Telephone Encounter (Signed)
Pt is scheduled on 2.14.17 at 1:45pm. Thank you.

## 2015-02-14 ENCOUNTER — Ambulatory Visit (INDEPENDENT_AMBULATORY_CARE_PROVIDER_SITE_OTHER): Payer: Medicare Other | Admitting: Family Medicine

## 2015-02-14 ENCOUNTER — Encounter: Payer: Self-pay | Admitting: Family Medicine

## 2015-02-14 ENCOUNTER — Other Ambulatory Visit: Payer: Self-pay

## 2015-02-14 VITALS — BP 166/68 | HR 62 | Temp 97.3°F | Ht 63.0 in | Wt 142.6 lb

## 2015-02-14 DIAGNOSIS — I10 Essential (primary) hypertension: Secondary | ICD-10-CM | POA: Diagnosis not present

## 2015-02-14 DIAGNOSIS — R471 Dysarthria and anarthria: Secondary | ICD-10-CM

## 2015-02-14 LAB — POCT GLYCOSYLATED HEMOGLOBIN (HGB A1C): HEMOGLOBIN A1C: 5.7

## 2015-02-14 MED ORDER — LISINOPRIL 10 MG PO TABS: 10.0000 mg | ORAL_TABLET | Freq: Every day | ORAL | Status: DC

## 2015-02-14 NOTE — Progress Notes (Signed)
   HPI  Patient presents today here fo discussion of the dysarthria.  Patient's plans at last night she had a 20 minute episode of difficulty making her worse. She had generalized weakness at the time but no unilateral focus focal weakness. She's had a mild left-sided headache only while she coughs today. She's making her words normally, she is in her normal mindset per her husband. She's had some general memory loss over last 6 months to a year which has not worsened. She's not having any problems walking.  She denies any difficulty speaking or thinking   PMH: Smoking status noted ROS: Per HPI  Objective: BP 166/68 mmHg  Pulse 62  Temp(Src) 97.3 F (36.3 C) (Oral)  Ht 5' 3" (1.6 m)  Wt 142 lb 9.6 oz (64.683 kg)  BMI 25.27 kg/m2 Gen: NAD, alert, cooperative with exam HEENT: NCAT, EOMI, PERRL CV: RRR, good S1/S2, no murmur Resp: CTABL, no wheezes, non-labored Ext: No edema, warm Neuro: Alert and oriented,  strength 4/5 and symmetric in all 4 extremities, sensation intact throughout, cranial nerves II through XII intact, normal gait   Assessment and plan:  # Dysarthria I'm very suspicious of TIA. She's had complete workup over the last 6 months including carotid Doppler, MRI of the brain, MRA brain Her last LDL was about 2 years ago was 70. She is on Coumadin for paroxysmal A. fib and has been therapeutic on a check just about 2-1/2 weeks ago. Have discussed with her thoroughly signs of stroke and very low threshold for calling for emergency medical care. She agrees to this. Has no focal deficits or residual effects today, so I have not sent her to the hospital or for a stat MRI. I recommended she see her neurologist in the next week.- She will call for this appointment    Orders Placed This Encounter  Procedures  . LDL Cholesterol, Direct  . CBC with Differential  . CMP14+EGFR  . POCT glycosylated hemoglobin (Hb A1C)    Laroy Apple, MD [facility] 02/14/2015, 3:27 PM

## 2015-02-14 NOTE — Patient Instructions (Signed)
Great to see you!  Please Call Dr. Freddie Apley Office for an appointment next week.   Please seek emergency medical help if the symptoms return.

## 2015-02-15 LAB — CBC WITH DIFFERENTIAL/PLATELET
BASOS ABS: 0.1 10*3/uL (ref 0.0–0.2)
Basos: 1 %
EOS (ABSOLUTE): 0.2 10*3/uL (ref 0.0–0.4)
Eos: 3 %
HEMATOCRIT: 35.6 % (ref 34.0–46.6)
HEMOGLOBIN: 12.1 g/dL (ref 11.1–15.9)
Immature Grans (Abs): 0 10*3/uL (ref 0.0–0.1)
Immature Granulocytes: 0 %
LYMPHS ABS: 1.9 10*3/uL (ref 0.7–3.1)
Lymphs: 28 %
MCH: 31.3 pg (ref 26.6–33.0)
MCHC: 34 g/dL (ref 31.5–35.7)
MCV: 92 fL (ref 79–97)
MONOCYTES: 14 %
Monocytes Absolute: 0.9 10*3/uL (ref 0.1–0.9)
NEUTROS ABS: 3.7 10*3/uL (ref 1.4–7.0)
Neutrophils: 54 %
Platelets: 307 10*3/uL (ref 150–379)
RBC: 3.86 x10E6/uL (ref 3.77–5.28)
RDW: 14.3 % (ref 12.3–15.4)
WBC: 6.7 10*3/uL (ref 3.4–10.8)

## 2015-02-15 LAB — CMP14+EGFR
ALBUMIN: 3.9 g/dL (ref 3.5–4.7)
ALK PHOS: 61 IU/L (ref 39–117)
ALT: 9 IU/L (ref 0–32)
AST: 19 IU/L (ref 0–40)
Albumin/Globulin Ratio: 1.4 (ref 1.1–2.5)
BILIRUBIN TOTAL: 0.3 mg/dL (ref 0.0–1.2)
BUN / CREAT RATIO: 17 (ref 11–26)
BUN: 13 mg/dL (ref 8–27)
CHLORIDE: 91 mmol/L — AB (ref 96–106)
CO2: 22 mmol/L (ref 18–29)
Calcium: 8.6 mg/dL — ABNORMAL LOW (ref 8.7–10.3)
Creatinine, Ser: 0.78 mg/dL (ref 0.57–1.00)
GFR calc Af Amer: 83 mL/min/{1.73_m2} (ref >59)
GFR calc non Af Amer: 72 mL/min/{1.73_m2} (ref >59)
GLOBULIN, TOTAL: 2.7 g/dL (ref 1.5–4.5)
GLUCOSE: 102 mg/dL — AB (ref 65–99)
POTASSIUM: 4.7 mmol/L (ref 3.5–5.2)
SODIUM: 136 mmol/L (ref 134–144)
Total Protein: 6.6 g/dL (ref 6.0–8.5)

## 2015-02-15 LAB — LDL CHOLESTEROL, DIRECT: LDL Direct: 134 mg/dL — ABNORMAL HIGH (ref 0–99)

## 2015-02-18 ENCOUNTER — Encounter (INDEPENDENT_AMBULATORY_CARE_PROVIDER_SITE_OTHER): Payer: Self-pay | Admitting: Internal Medicine

## 2015-02-18 ENCOUNTER — Ambulatory Visit (INDEPENDENT_AMBULATORY_CARE_PROVIDER_SITE_OTHER): Payer: Medicare Other | Admitting: Internal Medicine

## 2015-02-18 VITALS — BP 114/80 | HR 72 | Temp 97.5°F | Ht 63.0 in | Wt 139.0 lb

## 2015-02-18 DIAGNOSIS — A09 Infectious gastroenteritis and colitis, unspecified: Secondary | ICD-10-CM | POA: Diagnosis not present

## 2015-02-18 DIAGNOSIS — A047 Enterocolitis due to Clostridium difficile: Secondary | ICD-10-CM | POA: Diagnosis not present

## 2015-02-18 DIAGNOSIS — R197 Diarrhea, unspecified: Secondary | ICD-10-CM

## 2015-02-18 DIAGNOSIS — A0472 Enterocolitis due to Clostridium difficile, not specified as recurrent: Secondary | ICD-10-CM

## 2015-02-18 NOTE — Progress Notes (Signed)
Subjective:    Patient ID: [name], female    DOB: 02/25/1932, 82 y.o.   MRN: [id number]  HPI Referred by Dr. Wendi Snipes for diarrhea. She tells she has incontinence. When she has the urge to go, she has to go right then or she will be incontinent. She has been incontinent for about a year. She ususally has 4-5 BMs a day and are always runny.  She says are stools are foul smelling. She is wondering if she has C-diff. She takes 3-4 Imodium daily.  She says she has bleeding hemorrhoids. She sees blood on the tissue when she wipes. She tells me a year ago her stools were normal. She would have 2-3 stools a day and were nice and formed.  She recently underwent a cholecystectomy in January by Dr. Rosendo Gros for biliary colic. C difficile was positive in hospital.  She says after discharge from hospital she was still having diarrhea. Her appetite is good. She has not lost any weight. No abdominal pain.  Her last colonoscopy   was in 2012 yrs ago by Dr. Delfin Edis which revealed internal hemorrhoids, moderate diverticulosis, sessile polyp in the sigmoid colon.   Hx of C-difficile in 01/14/2015  And covered  with Vancomycin in appears..     No NSAIDS. except for a Baby ASA occasionally  Hx significant for atrial fib and maintained on Warfarin.   Colonoscopy Dr. Maurene Capes 2 /06/2010: internal hemorrhoids, moderate diverticulosis, sessile polyp in the sigmoid colon. Biopsy:Benign small bowel mucosa. No villous atrophy, inflammation or other abnormalities present.   01/13/2015 EGD: epigastric pain, nausea and vomiting: Dr. Lajuan Lines. Pyrtle. The mucosa of the esophagus appeared normal. $cm hiatal hernia. The mucosa of the stomach appeared normal. The duodenal mucosa showed no abnormalities in the bulb and 2nd part of the duodenum.      Review of Systems Past Medical History  Diagnosis Date  . Atrial fibrillation (HCC)     Paroxysmal, Flecainide therapy  . Warfarin anticoagulation   . Hypothyroidism     . Carotid bruit     Doppler, December, 2009, no abnormality  . Cardiomegaly     EF normal, echo, 2003  //   EF 60-65%, echo, June 18, 123456, diastolic dysfunction,  . Normal nuclear stress test     Normal, 2001  . HLD (hyperlipidemia)   . HTN (hypertension)   . Osteoarthritis   . Calf pain     September, 2012, at rest  . PONV (postoperative nausea and vomiting)   . GERD (gastroesophageal reflux disease)   . Anemia   . Rectal bleeding 2001    diverticulosis and int hemorrhoids on 07/1999 and 02/2010 colonoscopies.  . Hypothyroidism   . Cataract   . Stroke ([place])   . Insomnia   . History of shingles     Past Surgical History  Procedure Laterality Date  . Total knee arthroplasty  05/26/10    right  . Total shoulder replacement  01/2010    left  . Shoulder open rotator cuff repair      bilateral  . Total knee arthroplasty  11/17/2010    Procedure: TOTAL KNEE ARTHROPLASTY;  Surgeon: Mauri Pole;  Location: [facility];  Service: Orthopedics;  Laterality: Left;  . Total knee arthroplasty      Right  . Cataract extraction w/phaco Right 04/15/2014    Procedure: CATARACT EXTRACTION PHACO AND INTRAOCULAR LENS PLACEMENT (IOC);  Surgeon: Tonny Branch, MD;  Location: [facility];  Service: Ophthalmology;  Laterality: Right;  CDE: 13.30  . Cataract extraction w/phaco Left 05/13/2014    Procedure: CATARACT EXTRACTION PHACO AND INTRAOCULAR LENS PLACEMENT LEFT EYE;  Surgeon: Tonny Branch, MD;  Location: [facility];  Service: Ophthalmology;  Laterality: Left;  CDE:13.00  . Esophagogastroduodenoscopy N/A 01/13/2015    Procedure: ESOPHAGOGASTRODUODENOSCOPY (EGD);  Surgeon: Jerene Bears, MD;  Location: [facility] ENDOSCOPY;  Service: Endoscopy;  Laterality: N/A;  . Cholecystectomy N/A 01/15/2015    Procedure: LAPAROSCOPIC CHOLECYSTECTOMY;  Surgeon: Ralene Ok, MD;  Location: [place];  Service: General;  Laterality: N/A;    Allergies  Allergen Reactions  . Penicillins Rash    Has patient had a PCN reaction causing  immediate rash, facial/tongue/throat swelling, SOB or lightheadedness with hypotension: Yes Has patient had a PCN reaction causing severe rash involving mucus membranes or skin necrosis: No Has patient had a PCN reaction that required hospitalization No Has patient had a PCN reaction occurring within the last 10 years: No If all of the above answers are "NO", then may proceed with Cephalosporin use.    Current Outpatient Prescriptions on File Prior to Visit  Medication Sig Dispense Refill  . acetaminophen (TYLENOL) 500 MG tablet Take 500 mg by mouth every 6 (six) hours as needed for moderate pain.    Marland Kitchen atenolol (TENORMIN) 25 MG tablet Take 25 mg by mouth daily. Reported on 01/27/2015    . dicyclomine (BENTYL) 10 MG capsule Take 10 mg by mouth 2 (two) times daily as needed for spasms (upset stomach).     . flecainide (TAMBOCOR) 100 MG tablet Take 1 tablet (100 mg total) by mouth 2 (two) times daily. 60 tablet 0  . fluticasone (FLONASE) 50 MCG/ACT nasal spray Place 1 spray into both nostrils daily.     . furosemide (LASIX) 40 MG tablet Take 0.5-1 tablets (20-40 mg total) by mouth daily. Take 1/2 tablet (20 mg) by mouth every morning, take 1 tablet (40 mg) if stomach and legs are swollen with fluid 90 tablet 1  . GARLIC PO Take 1 tablet by mouth daily after supper.     . levothyroxine (SYNTHROID, LEVOTHROID) 50 MCG tablet Take 50 mcg by mouth daily before breakfast.     . lisinopril (PRINIVIL,ZESTRIL) 10 MG tablet Take 1 tablet (10 mg total) by mouth daily. 30 tablet 4  . meclizine (ANTIVERT) 25 MG tablet Take 25 mg by mouth daily as needed for dizziness (vertigo). Inner ear    . mupirocin ointment (BACTROBAN) 2 % APPLY INTO NOSTRIL(S) 2 TIMES A DAY AS DIRECTED 22 g 0  . OVER THE COUNTER MEDICATION Place 1 drop into both ears daily as needed (dryness). Over the counter oily ear drops    . OVER THE COUNTER MEDICATION Place 1 drop into both eyes daily as needed (dry eyes). Lubricating eye drops    .  potassium chloride SA (K-DUR,KLOR-CON) 20 MEQ tablet Take 20 mEq by mouth daily with supper.     . traMADol (ULTRAM) 50 MG tablet Take 1 tablet (50 mg total) by mouth every 8 (eight) hours as needed. 30 tablet 3  . vitamin B-12 (CYANOCOBALAMIN) 500 MCG tablet Take 500 mcg by mouth 3 (three) times a week.     . warfarin (COUMADIN) 5 MG tablet Take 5 mg by mouth daily.     Marland Kitchen zolpidem (AMBIEN) 10 MG tablet Take 0.5-1 tablets (5-10 mg total) by mouth at bedtime as needed for sleep. Take 1/2 to 1 tablet at bedtime as needed (Patient taking differently: Take 5  mg by mouth at bedtime. ) 15 tablet 2  . ergocalciferol (VITAMIN D2) 50000 UNITS capsule Take 50,000 Units by mouth once a week. Reported on 01/27/2015     No current facility-administered medications on file prior to visit.        Objective:   Physical Exam Blood pressure 114/80, pulse 72, temperature 97.5 F (36.4 C), height 5\' 3"  (1.6 m), weight 139 lb (63.05 kg). Alert and oriented. Skin warm and dry. Oral mucosa is moist.   . Sclera anicteric, conjunctivae is pink. Thyroid not enlarged. No cervical lymphadenopathy. Lungs clear. Heart regular rate and rhythm.  Abdomen is soft. Bowel sounds are positive. No hepatomegaly. No abdominal masses felt. No tenderness.  No edema to lower extremities.  Stool brown and guaiac negative.   Lot [id number] Ex 9/17      Assessment & Plan:  Diarrhea. ? C diff. Will get a stool sample. Further recommendations to follow.  Samples of Align x 5 weeks given to patient.  Imodium as needed

## 2015-02-18 NOTE — Patient Instructions (Addendum)
C diff. Further recommendations to follow Probiotic daily (Samples given)

## 2015-02-19 ENCOUNTER — Other Ambulatory Visit: Payer: Self-pay

## 2015-02-19 MED ORDER — LISINOPRIL 10 MG PO TABS: 10.0000 mg | ORAL_TABLET | Freq: Every day | ORAL | Status: DC

## 2015-02-20 ENCOUNTER — Encounter: Payer: Self-pay | Admitting: Family Medicine

## 2015-02-20 ENCOUNTER — Ambulatory Visit (INDEPENDENT_AMBULATORY_CARE_PROVIDER_SITE_OTHER): Payer: Medicare Other | Admitting: Family Medicine

## 2015-02-20 VITALS — BP 132/72 | HR 57 | Temp 97.5°F | Ht 63.0 in | Wt 141.0 lb

## 2015-02-20 DIAGNOSIS — R471 Dysarthria and anarthria: Secondary | ICD-10-CM

## 2015-02-20 DIAGNOSIS — Z23 Encounter for immunization: Secondary | ICD-10-CM

## 2015-02-20 DIAGNOSIS — Z87898 Personal history of other specified conditions: Secondary | ICD-10-CM | POA: Diagnosis not present

## 2015-02-20 DIAGNOSIS — I10 Essential (primary) hypertension: Secondary | ICD-10-CM

## 2015-02-20 DIAGNOSIS — Z0000 Encounter for general adult medical examination without abnormal findings: Secondary | ICD-10-CM

## 2015-02-20 LAB — CLOSTRIDIUM DIFFICILE BY PCR: CDIFFPCR: NOT DETECTED

## 2015-02-20 NOTE — Addendum Note (Signed)
Addended by: Timmothy Euler on: 02/20/2015 02:34 PM   Modules accepted: Miquel Dunn

## 2015-02-20 NOTE — Addendum Note (Signed)
Addended by: Nigel Berthold C on: 02/20/2015 11:05 AM   Modules accepted: Orders, SmartSet

## 2015-02-20 NOTE — Patient Instructions (Signed)
Great to see you!  PLease make an appointment to see Dr. Merlene Laughter  Come back to see me in 3 months

## 2015-02-20 NOTE — Progress Notes (Signed)
   HPI  Patient presents today here for follow-up.  Patient was seen last week with dysarthria that lasted about 20 minutes, I presume that she may have had a TIA. Since leaving she has not had any other symptoms, she has not made an appointment with her neurologist as requested. Her labs have been reviewed and she does have an elevated LDL, however she has a history of myalgias with Crestor and at her age with already polypharmacy I would like the neurologists recommendation on statin treatment.  Hypertension No medication intolerance No chest pain, dyspnea, leg edema  She does have A. fib and occasionally feels her irregular heart rhythm. Good medication compliance with Coumadin.  He is still having slight diarrhea after her cholecystectomy, however her right upper quadrant, epigastric, and atypical chest pain had resolved   PMH: Smoking status noted ROS: Per HPI  Objective: BP 132/72 mmHg  Pulse 57  Temp(Src) 97.5 F (36.4 C) (Oral)  Ht 5\' 3"  (1.6 m)  Wt 141 lb (63.957 kg)  BMI 24.98 kg/m2 Gen: NAD, alert, cooperative with exam HEENT: NCAT, EOMI CV: RRR, good S1/S2, no murmur Resp: CTABL, no wheezes, non-labored Ext: No edema, warm Neuro: Alert and oriented, strength 4/5 and symmetric in bilateral upper and lower extremities with intact sensation, normal gait  Assessment and plan:  # Dysarthria, transient, concern for TIA Recommend close follow-up with her neurologist For now continue Coumadin- no antiplatelet Labs reviewed, she does have elevated LDL at 135, however her age and history of myalgias with statins I will defer decision to neuro, consider livalo BP at goal today, recent MRI/MRA and carotid duplex done  # HTN At goal, no changes  # HCM Prevnar today   Laroy Apple, MD [facility] 02/20/2015, 10:45 AM

## 2015-02-25 DIAGNOSIS — B351 Tinea unguium: Secondary | ICD-10-CM | POA: Diagnosis not present

## 2015-02-25 DIAGNOSIS — I70203 Unspecified atherosclerosis of native arteries of extremities, bilateral legs: Secondary | ICD-10-CM | POA: Diagnosis not present

## 2015-02-25 DIAGNOSIS — L84 Corns and callosities: Secondary | ICD-10-CM | POA: Diagnosis not present

## 2015-02-26 ENCOUNTER — Ambulatory Visit (INDEPENDENT_AMBULATORY_CARE_PROVIDER_SITE_OTHER): Payer: Medicare Other | Admitting: Pharmacist

## 2015-02-26 DIAGNOSIS — I480 Paroxysmal atrial fibrillation: Secondary | ICD-10-CM

## 2015-02-26 LAB — POCT INR: INR: 2.7

## 2015-02-26 NOTE — Patient Instructions (Signed)
Anticoagulation Dose Instructions as of 02/26/2015      [name] Tue Wed Thu Fri Sat   New Dose 5 mg 5 mg 5 mg 5 mg 5 mg 5 mg 5 mg    Description        Continue current warfarin dose of 5mg  -  1 tablet once each day.     INR was 2.7 today

## 2015-03-03 ENCOUNTER — Encounter (HOSPITAL_COMMUNITY): Payer: Self-pay | Admitting: *Deleted

## 2015-03-03 ENCOUNTER — Emergency Department (HOSPITAL_COMMUNITY): Payer: Medicare Other

## 2015-03-03 ENCOUNTER — Emergency Department (HOSPITAL_COMMUNITY)
Admission: EM | Admit: 2015-03-03 | Discharge: 2015-03-03 | Disposition: A | Discharge status: Home / Self Care | Payer: Medicare Other | Attending: Emergency Medicine | Admitting: Emergency Medicine

## 2015-03-03 DIAGNOSIS — I480 Paroxysmal atrial fibrillation: Secondary | ICD-10-CM | POA: Diagnosis not present

## 2015-03-03 DIAGNOSIS — E785 Hyperlipidemia, unspecified: Secondary | ICD-10-CM | POA: Diagnosis not present

## 2015-03-03 DIAGNOSIS — Z7901 Long term (current) use of anticoagulants: Secondary | ICD-10-CM | POA: Insufficient documentation

## 2015-03-03 DIAGNOSIS — R202 Paresthesia of skin: Secondary | ICD-10-CM

## 2015-03-03 DIAGNOSIS — G4700 Insomnia, unspecified: Secondary | ICD-10-CM | POA: Diagnosis not present

## 2015-03-03 DIAGNOSIS — D649 Anemia, unspecified: Secondary | ICD-10-CM | POA: Diagnosis not present

## 2015-03-03 DIAGNOSIS — Z8673 Personal history of transient ischemic attack (TIA), and cerebral infarction without residual deficits: Secondary | ICD-10-CM | POA: Insufficient documentation

## 2015-03-03 DIAGNOSIS — E039 Hypothyroidism, unspecified: Secondary | ICD-10-CM | POA: Insufficient documentation

## 2015-03-03 DIAGNOSIS — Z79899 Other long term (current) drug therapy: Secondary | ICD-10-CM | POA: Diagnosis not present

## 2015-03-03 DIAGNOSIS — I10 Essential (primary) hypertension: Secondary | ICD-10-CM | POA: Insufficient documentation

## 2015-03-03 DIAGNOSIS — Z8619 Personal history of other infectious and parasitic diseases: Secondary | ICD-10-CM | POA: Diagnosis not present

## 2015-03-03 DIAGNOSIS — Z880 Allergy status to penicillin: Secondary | ICD-10-CM | POA: Insufficient documentation

## 2015-03-03 DIAGNOSIS — Z8719 Personal history of other diseases of the digestive system: Secondary | ICD-10-CM | POA: Diagnosis not present

## 2015-03-03 DIAGNOSIS — Z8739 Personal history of other diseases of the musculoskeletal system and connective tissue: Secondary | ICD-10-CM | POA: Diagnosis not present

## 2015-03-03 DIAGNOSIS — R200 Anesthesia of skin: Secondary | ICD-10-CM | POA: Diagnosis not present

## 2015-03-03 LAB — COMPREHENSIVE METABOLIC PANEL
ALT: 11 U/L — AB (ref 14–54)
ANION GAP: 8 (ref 5–15)
AST: 21 U/L (ref 15–41)
Albumin: 3.7 g/dL (ref 3.5–5.0)
Alkaline Phosphatase: 51 U/L (ref 38–126)
BUN: 11 mg/dL (ref 6–20)
CHLORIDE: 99 mmol/L — AB (ref 101–111)
CO2: 29 mmol/L (ref 22–32)
Calcium: 8.7 mg/dL — ABNORMAL LOW (ref 8.9–10.3)
Creatinine, Ser: 0.6 mg/dL (ref 0.44–1.00)
GFR calc non Af Amer: >60 mL/min (ref >60)
Glucose, Bld: 111 mg/dL — ABNORMAL HIGH (ref 65–99)
Potassium: 4.1 mmol/L (ref 3.5–5.1)
SODIUM: 136 mmol/L (ref 135–145)
Total Bilirubin: 0.4 mg/dL (ref 0.3–1.2)
Total Protein: 6.9 g/dL (ref 6.5–8.1)

## 2015-03-03 LAB — CBC
HCT: 37.1 % (ref 36.0–46.0)
Hemoglobin: 12.2 g/dL (ref 12.0–15.0)
MCH: 30.9 pg (ref 26.0–34.0)
MCHC: 32.9 g/dL (ref 30.0–36.0)
MCV: 93.9 fL (ref 78.0–100.0)
PLATELETS: 269 10*3/uL (ref 150–400)
RBC: 3.95 MIL/uL (ref 3.87–5.11)
RDW: 14.4 % (ref 11.5–15.5)
WBC: 6.5 10*3/uL (ref 4.0–10.5)

## 2015-03-03 LAB — I-STAT CHEM 8, ED
BUN: 11 mg/dL (ref 6–20)
CALCIUM ION: 1.12 mmol/L — AB (ref 1.13–1.30)
CHLORIDE: 96 mmol/L — AB (ref 101–111)
Creatinine, Ser: 0.6 mg/dL (ref 0.44–1.00)
Glucose, Bld: 106 mg/dL — ABNORMAL HIGH (ref 65–99)
HEMATOCRIT: 40 % (ref 36.0–46.0)
Hemoglobin: 13.6 g/dL (ref 12.0–15.0)
POTASSIUM: 4.1 mmol/L (ref 3.5–5.1)
SODIUM: 136 mmol/L (ref 135–145)
TCO2: 26 mmol/L (ref 0–100)

## 2015-03-03 LAB — APTT: aPTT: 46 seconds — ABNORMAL HIGH (ref 24–37)

## 2015-03-03 LAB — ETHANOL: ALCOHOL ETHYL (B): 5 mg/dL — AB (ref <5)

## 2015-03-03 LAB — PROTIME-INR
INR: 3.28 — AB (ref 0.00–1.49)
PROTHROMBIN TIME: 32.7 s — AB (ref 11.6–15.2)

## 2015-03-03 LAB — RAPID URINE DRUG SCREEN, HOSP PERFORMED
AMPHETAMINES: NOT DETECTED
BENZODIAZEPINES: NOT DETECTED
Barbiturates: NOT DETECTED
Cocaine: NOT DETECTED
OPIATES: NOT DETECTED
Tetrahydrocannabinol: NOT DETECTED

## 2015-03-03 LAB — URINALYSIS, ROUTINE W REFLEX MICROSCOPIC
BILIRUBIN URINE: NEGATIVE
Glucose, UA: NEGATIVE mg/dL
Ketones, ur: NEGATIVE mg/dL
Leukocytes, UA: NEGATIVE
Nitrite: NEGATIVE
PH: 5.5 (ref 5.0–8.0)
Protein, ur: NEGATIVE mg/dL
SPECIFIC GRAVITY, URINE: 1.01 (ref 1.005–1.030)

## 2015-03-03 LAB — DIFFERENTIAL
BASOS PCT: 1 %
Basophils Absolute: 0 10*3/uL (ref 0.0–0.1)
EOS PCT: 2 %
Eosinophils Absolute: 0.1 10*3/uL (ref 0.0–0.7)
Lymphocytes Relative: 24 %
Lymphs Abs: 1.6 10*3/uL (ref 0.7–4.0)
MONO ABS: 0.7 10*3/uL (ref 0.1–1.0)
Monocytes Relative: 11 %
NEUTROS ABS: 4 10*3/uL (ref 1.7–7.7)
Neutrophils Relative %: 62 %

## 2015-03-03 LAB — I-STAT TROPONIN, ED: Troponin i, poc: 0 ng/mL (ref 0.00–0.08)

## 2015-03-03 LAB — CBG MONITORING, ED: GLUCOSE-CAPILLARY: 85 mg/dL (ref 65–99)

## 2015-03-03 LAB — URINE MICROSCOPIC-ADD ON

## 2015-03-03 NOTE — ED Notes (Signed)
Dr. Wyvonnia Dusky notified of pt condition. No code stroke at this time.

## 2015-03-03 NOTE — ED Notes (Signed)
Pt comes in with upper lip tingling starting around 1330 today. Pt has no other symptoms, pt has equal grip strengths and equal smile, pt is able to ambulate. Pt has hx of TIA.

## 2015-03-03 NOTE — ED Notes (Signed)
Ambulated Pt in the hall way Pt walked on her own and done fine.

## 2015-03-03 NOTE — ED Provider Notes (Signed)
CSN: [id number]     Arrival date & time 03/03/15  1515 History   First MD Initiated Contact with Patient 03/03/15 1528     Chief Complaint  Patient presents with  . Numbness     (Consider location/radiation/quality/duration/timing/severity/associated sxs/prior Treatment) HPI Comments: Patient presents from home with "tingling and numbness" to her upper lip that onset about 3 hours ago while at home resting. Symptoms have gradually improved after she took an aspirin. Tingling is bilateral of her upper lip only. There is no difficulty speaking or swallowing. There is no facial droop. There is no focal weakness of her extremities or other numbness or tingling. Patient with history of TIA. She has atrial fibrillation and is on Coumadin. Denies any chest pain or shortness of breath. Denies abdominal pain, nausea or vomiting. Denies any fever. Good by mouth intake and urine output. Denies any dizziness or lightheadedness.  The history is provided by the patient.    Past Medical History  Diagnosis Date  . Atrial fibrillation (HCC)     Paroxysmal, Flecainide therapy  . Warfarin anticoagulation   . Hypothyroidism   . Carotid bruit     Doppler, December, 2009, no abnormality  . Cardiomegaly     EF normal, echo, 2003  //   EF 60-65%, echo, June 18, 123456, diastolic dysfunction,  . Normal nuclear stress test     Normal, 2001  . HLD (hyperlipidemia)   . HTN (hypertension)   . Osteoarthritis   . Calf pain     September, 2012, at rest  . PONV (postoperative nausea and vomiting)   . GERD (gastroesophageal reflux disease)   . Anemia   . Rectal bleeding 2001    diverticulosis and int hemorrhoids on 07/1999 and 02/2010 colonoscopies.  . Hypothyroidism   . Cataract   . Stroke ([place])   . Insomnia   . History of shingles    Past Surgical History  Procedure Laterality Date  . Total knee arthroplasty  05/26/10    right  . Total shoulder replacement  01/2010    left  . Shoulder open rotator cuff  repair      bilateral  . Total knee arthroplasty  11/17/2010    Procedure: TOTAL KNEE ARTHROPLASTY;  Surgeon: Mauri Pole;  Location: [facility];  Service: Orthopedics;  Laterality: Left;  . Total knee arthroplasty      Right  . Cataract extraction w/phaco Right 04/15/2014    Procedure: CATARACT EXTRACTION PHACO AND INTRAOCULAR LENS PLACEMENT (IOC);  Surgeon: Tonny Branch, MD;  Location: [facility];  Service: Ophthalmology;  Laterality: Right;  CDE: 13.30  . Cataract extraction w/phaco Left 05/13/2014    Procedure: CATARACT EXTRACTION PHACO AND INTRAOCULAR LENS PLACEMENT LEFT EYE;  Surgeon: Tonny Branch, MD;  Location: [facility];  Service: Ophthalmology;  Laterality: Left;  CDE:13.00  . Esophagogastroduodenoscopy N/A 01/13/2015    Procedure: ESOPHAGOGASTRODUODENOSCOPY (EGD);  Surgeon: Jerene Bears, MD;  Location: [facility];  Service: Endoscopy;  Laterality: N/A;  . Cholecystectomy N/A 01/15/2015    Procedure: LAPAROSCOPIC CHOLECYSTECTOMY;  Surgeon: Ralene Ok, MD;  Location: [facility] OR;  Service: General;  Laterality: N/A;   Family History  Problem Relation Age of Onset  . COPD Brother   . Atrial fibrillation Brother   . Diabetes Brother   . Liver cancer Brother     Alcohol-related  . Colon cancer Brother   . Heart attack Father   . Rheum arthritis Mother   . Arthritis Mother   . Heart disease Mother   .  Heart attack Daughter   . Coronary artery disease Brother   . Gallbladder disease Brother    Social History  Substance Use Topics  . Smoking status: Never Smoker   . Smokeless tobacco: Never Used  . Alcohol Use: No   OB History    No data available     Review of Systems  Constitutional: Negative for fever, activity change, appetite change and fatigue.  HENT: Negative for congestion.   Eyes: Negative for visual disturbance.  Respiratory: Negative for cough, chest tightness and shortness of breath.   Cardiovascular: Negative for chest pain.  Gastrointestinal: Negative for nausea,  vomiting and abdominal pain.  Genitourinary: Negative for dysuria, hematuria, vaginal bleeding and vaginal discharge.  Musculoskeletal: Negative for myalgias and arthralgias.  Neurological: Positive for numbness. Negative for dizziness, facial asymmetry, weakness, light-headedness and headaches.  A complete 10 system review of systems was obtained and all systems are negative except as noted in the HPI and PMH.      Allergies  Penicillins  Home Medications   Prior to Admission medications   Medication Sig Start Date End Date Taking? Authorizing Provider  acetaminophen (TYLENOL) 500 MG tablet Take 500 mg by mouth every 6 (six) hours as needed for moderate pain.   Yes Historical Provider, MD  atenolol (TENORMIN) 25 MG tablet Take 25 mg by mouth daily. Reported on 01/27/2015   Yes Historical Provider, MD  ergocalciferol (VITAMIN D2) 50000 UNITS capsule Take 50,000 Units by mouth every Sunday. Reported on 01/27/2015   Yes Historical Provider, MD  flecainide (TAMBOCOR) 100 MG tablet Take 1 tablet (100 mg total) by mouth 2 (two) times daily. 01/28/15  Yes Timmothy Euler, MD  furosemide (LASIX) 40 MG tablet Take 0.5-1 tablets (20-40 mg total) by mouth daily. Take 1/2 tablet (20 mg) by mouth every morning, take 1 tablet (40 mg) if stomach and legs are swollen with fluid 02/10/15  Yes Timmothy Euler, MD  GARLIC PO Take 1 tablet by mouth daily after supper.    Yes Historical Provider, MD  levothyroxine (SYNTHROID, LEVOTHROID) 50 MCG tablet Take 50 mcg by mouth daily before breakfast.  01/20/15  Yes Historical Provider, MD  lisinopril (PRINIVIL,ZESTRIL) 10 MG tablet Take 1 tablet (10 mg total) by mouth daily. Patient taking differently: Take 10 mg by mouth every evening.  02/19/15  Yes Timmothy Euler, MD  potassium chloride SA (K-DUR,KLOR-CON) 20 MEQ tablet Take 20 mEq by mouth every morning.  10/19/12  Yes Historical Provider, MD  vitamin B-12 (CYANOCOBALAMIN) 500 MCG tablet Take 500 mcg by mouth 3  (three) times a week.    Yes Historical Provider, MD  warfarin (COUMADIN) 5 MG tablet Take 5 mg by mouth every morning.    Yes Historical Provider, MD  zolpidem (AMBIEN) 10 MG tablet Take 0.5-1 tablets (5-10 mg total) by mouth at bedtime as needed for sleep. Take 1/2 to 1 tablet at bedtime as needed Patient taking differently: Take 5 mg by mouth at bedtime.  12/17/14  Yes Sharion Balloon, FNP  mupirocin ointment (BACTROBAN) 2 % APPLY INTO NOSTRIL(S) 2 TIMES A DAY AS DIRECTED Patient not taking: Reported on 03/03/2015 01/17/15   Timmothy Euler, MD   BP 146/61 mmHg  Pulse 61  Temp(Src) 97.3 F (36.3 C) (Oral)  Resp 18  Ht 5\' 3"  (1.6 m)  Wt 140 lb (63.504 kg)  BMI 24.81 kg/m2  SpO2 97% Physical Exam  Constitutional: She is oriented to person, place, and time. She appears well-developed  and well-nourished. No distress.  HENT:  Head: Normocephalic and atraumatic.  Mouth/Throat: Oropharynx is clear and moist. No oropharyngeal exudate.  Eyes: Conjunctivae and EOM are normal. Pupils are equal, round, and reactive to light.  Neck: Normal range of motion. Neck supple.  No meningismus.  Cardiovascular: Normal rate, regular rhythm, normal heart sounds and intact distal pulses.   No murmur heard. Pulmonary/Chest: Effort normal and breath sounds normal. No respiratory distress.  Abdominal: Soft. There is no tenderness. There is no rebound and no guarding.  Musculoskeletal: Normal range of motion. She exhibits no edema or tenderness.  Neurological: She is alert and oriented to person, place, and time. No cranial nerve deficit. She exhibits normal muscle tone. Coordination normal.  No ataxia on finger to nose bilaterally. No pronator drift. 5/5 strength throughout. CN 2-12 intact.Equal grip strength. Sensation intact.  Subjective paresthesias of the entire upper lip Bilateral. Tongue midline. No facial droop.  Skin: Skin is warm.  Psychiatric: She has a normal mood and affect. Her behavior is  normal.  Nursing note and vitals reviewed.   ED Course  Procedures (including critical care time) Labs Review Labs Reviewed  ETHANOL - Abnormal; Notable for the following:    Alcohol, Ethyl (B) 5 (*)    All other components within normal limits  PROTIME-INR - Abnormal; Notable for the following:    Prothrombin Time 32.7 (*)    INR 3.28 (*)    All other components within normal limits  APTT - Abnormal; Notable for the following:    aPTT 46 (*)    All other components within normal limits  COMPREHENSIVE METABOLIC PANEL - Abnormal; Notable for the following:    Chloride 99 (*)    Glucose, Bld 111 (*)    Calcium 8.7 (*)    ALT 11 (*)    All other components within normal limits  URINALYSIS, ROUTINE W REFLEX MICROSCOPIC (NOT AT [facility]) - Abnormal; Notable for the following:    Hgb urine dipstick MODERATE (*)    All other components within normal limits  URINE MICROSCOPIC-ADD ON - Abnormal; Notable for the following:    Squamous Epithelial / LPF 0-5 (*)    Bacteria, UA RARE (*)    All other components within normal limits  I-STAT CHEM 8, ED - Abnormal; Notable for the following:    Chloride 96 (*)    Glucose, Bld 106 (*)    Calcium, Ion 1.12 (*)    All other components within normal limits  CBC  DIFFERENTIAL  URINE RAPID DRUG SCREEN, HOSP PERFORMED  I-STAT TROPOININ, ED  CBG MONITORING, ED    Imaging Review Ct Head Wo Contrast  03/03/2015  CLINICAL DATA:  Upper lip tingling.  History of TIA. EXAM: CT HEAD WITHOUT CONTRAST TECHNIQUE: Contiguous axial images were obtained from the base of the skull through the vertex without intravenous contrast. COMPARISON:  09/05/2014 head CT. FINDINGS: No evidence of parenchymal hemorrhage or extra-axial fluid collection. No mass lesion, mass effect, or midline shift. No CT evidence of acute infarction. Intracranial atherosclerosis. Nonspecific stable mild subcortical and periventricular white matter hypodensity, most in keeping with chronic  small vessel ischemic change. Cerebral volume is age appropriate. No ventriculomegaly. The visualized paranasal sinuses are essentially clear. The mastoid air cells are unopacified. No evidence of calvarial fracture. IMPRESSION: 1.  No evidence of acute intracranial abnormality. 2. Intracranial atherosclerosis and mild chronic small vessel ischemic white matter change. Electronically Signed   By: Ilona Sorrel M.D.   On: 03/03/2015  16:48   Mr Herby Abraham Contrast  03/03/2015  CLINICAL DATA:  82 year old hypertensive female with left facial numbness for the past day. Weakness. No known injury. Atrial fibrillation. Subsequent encounter. EXAM: MRI HEAD WITHOUT CONTRAST TECHNIQUE: Multiplanar, multiecho pulse sequences of the brain and surrounding structures were obtained without intravenous contrast. COMPARISON:  03/03/2015 head CT.  09/18/2013 brain MR. FINDINGS: Exam is motion degraded. No acute infarct or intracranial hemorrhage. Moderate chronic small vessel disease changes. No intracranial mass lesion noted on this unenhanced exam. No hydrocephalus. Major intracranial vascular structures are patent. No primary worrisome orbital abnormality noted. Cervical medullary junction unremarkable. IMPRESSION: Exam is motion degraded. No acute infarct. Moderate chronic small vessel disease changes. Electronically Signed   By: Genia Del M.D.   On: 03/03/2015 18:02   I have personally reviewed and evaluated these images and lab results as part of my medical decision-making.   EKG Interpretation   Date/Time:  Monday March 03 2015 15:32:41 EST Ventricular Rate:  75 PR Interval:  247 QRS Duration: 113 QT Interval:  452 QTC Calculation: 505 R Axis:   46 Text Interpretation:  Sinus or ectopic atrial rhythm Paired ventricular  premature complexes Prolonged PR interval Low voltage, extremity leads  Consider anterior infarct Minimal ST depression, lateral leads in a  pattern of bigeminy Confirmed by Spero Gunnels  MD,  Kameron Blethen [phone]) on 03/03/2015  4:07:01 PM      MDM   Final diagnoses:  Paresthesia   3 hours of upper lip tingling and numbness. No other neurological deficits or symptoms. Patient is on Coumadin. Code stroke not activated due to low NIH as well as improving symptoms and patient is already anticoagulated  Patient states bilateral upper lip numbness. no diplopia or dysarthria or dysphasia. No lateralizing weakness. On call neurologist Dr. Netta Neat recommends MRI Brain and INR check.  If MRI negative, no further workup necessary.  INR is 3.2. MRI shows no acute infarct. Per neurology recommendations with MRI negative there is no need for further workup or admission.  Patient updated.  She states lip numbness has resolved. No electrolyte abnormality to explain numbness. Atypical for CVA or TIA as bilateral.   She is tolerating PO and ambulatory. Return precautions discussed.  Ezequiel Essex, MD 03/03/15 [phone]

## 2015-03-03 NOTE — Discharge Instructions (Signed)
Paresthesia There is no evidence of stroke. Hold your Coumadin dose for one day. Follow-up with Dr. Wendi Snipes this week. Return to the ED if you develop new or worsening symptoms. Paresthesia is an abnormal burning or prickling sensation. This sensation is generally felt in the hands, arms, legs, or feet. However, it may occur in any part of the body. Usually, it is not painful. The feeling may be described as:  Tingling or numbness.  Pins and needles.  Skin crawling.  Buzzing.  Limbs falling asleep.  Itching. Most people experience temporary (transient) paresthesia at some time in their lives. Paresthesia may occur when you breathe too quickly (hyperventilation). It can also occur without any apparent cause. Commonly, paresthesia occurs when pressure is placed on a nerve. The sensation quickly goes away after the pressure is removed. For some people, however, paresthesia is a long-lasting (chronic) condition that is caused by an underlying disorder. If you continue to have paresthesia, you may need further medical evaluation. HOME CARE INSTRUCTIONS Watch your condition for any changes. Taking the following actions may help to lessen any discomfort that you are feeling:  Avoid drinking alcohol.  Try acupuncture or massage to help relieve your symptoms.  Keep all follow-up visits as directed by your health care provider. This is important. SEEK MEDICAL CARE IF:  You continue to have episodes of paresthesia.  Your burning or prickling feeling gets worse when you walk.  You have pain, cramps, or dizziness.  You develop a rash. SEEK IMMEDIATE MEDICAL CARE IF:  You feel weak.  You have trouble walking or moving.  You have problems with speech, understanding, or vision.  You feel confused.  You cannot control your bladder or bowel movements.  You have numbness after an injury.  You faint.   This information is not intended to replace advice given to you by your health care  provider. Make sure you discuss any questions you have with your health care provider.   Document Released: 12/11/2001 Document Revised: 05/07/2014 Document Reviewed: 12/17/2013 Elsevier Interactive Patient Education [employer].

## 2015-03-04 ENCOUNTER — Encounter (INDEPENDENT_AMBULATORY_CARE_PROVIDER_SITE_OTHER): Payer: Medicare Other | Admitting: Family Medicine

## 2015-03-04 DIAGNOSIS — I480 Paroxysmal atrial fibrillation: Secondary | ICD-10-CM | POA: Diagnosis not present

## 2015-03-04 DIAGNOSIS — I10 Essential (primary) hypertension: Secondary | ICD-10-CM

## 2015-03-04 DIAGNOSIS — K8051 Calculus of bile duct without cholangitis or cholecystitis with obstruction: Secondary | ICD-10-CM

## 2015-03-04 DIAGNOSIS — A047 Enterocolitis due to Clostridium difficile: Secondary | ICD-10-CM | POA: Diagnosis not present

## 2015-03-05 ENCOUNTER — Encounter: Payer: Self-pay | Admitting: Family Medicine

## 2015-03-05 ENCOUNTER — Ambulatory Visit (INDEPENDENT_AMBULATORY_CARE_PROVIDER_SITE_OTHER): Payer: Medicare Other | Admitting: Family Medicine

## 2015-03-05 VITALS — BP 150/73 | HR 58 | Temp 97.3°F | Ht 63.0 in | Wt 139.4 lb

## 2015-03-05 DIAGNOSIS — I480 Paroxysmal atrial fibrillation: Secondary | ICD-10-CM

## 2015-03-05 DIAGNOSIS — Z7901 Long term (current) use of anticoagulants: Secondary | ICD-10-CM | POA: Diagnosis not present

## 2015-03-05 DIAGNOSIS — I10 Essential (primary) hypertension: Secondary | ICD-10-CM

## 2015-03-05 LAB — POCT INR: INR: 2

## 2015-03-05 NOTE — Progress Notes (Signed)
   HPI  Patient presents today for ER follow-up.  Patient was seen 2 days ago in the emergency room for numbness above her upper lip. She explains that happened without cause and lasted several hours causing her to go to the hospital. She was worked up with an MRI which was negative for acute infarct, and several labs significant only for very slightly low calcium.  Patient explains she has had muscle twitching and this perioral paresthesia on for a few weeks. The perioral appears lesion would not leave like usual list episodes and she went to the emergency room.  On occasion she takes a baby aspirin with solution of symptoms. She has not been able to see her neurologist  PMH: Smoking status noted ROS: Per HPI  Objective: BP 150/73 mmHg  Pulse 58  Temp(Src) 97.3 F (36.3 C) (Oral)  Ht 5\' 3"  (1.6 m)  Wt 139 lb 6.4 oz (63.231 kg)  BMI 24.70 kg/m2 Gen: NAD, alert, cooperative with exam HEENT: NCAT, EOMI, PERRL CV: RRR, good S1/S2, no murmur Resp: CTABL, no wheezes, non-labored Ext: No edema, warm Neuro: Alert and oriented, nerves II through XII intact, strength 5/5 and sensation intact in bilateral upper and lower extremities, symmetric smile  Assessment and plan:  # Peri-oral paresthesia  possibly TIA, however her calcium was also slightly low, possibly hypocalcemia She is taking Tums routinely for indigestion, I have recommended adding one Citracal daily   plan to recheck in 2 months. Continue vitamin D, she's taking it   # hypertension Fluctuating slightly, continue atenolol and lisinopril  # A fib RRR today, INR elevated at ED and she held coumadin X 1 dose INR 2.0 today, continue 5 mg daily INR check 3 weeks with clinical pharmacist    Laroy Apple, MD [facility] 03/05/2015, 8:42 AM

## 2015-03-05 NOTE — Patient Instructions (Signed)
Great to see you!  Let see you back in 2 months  Come back any time  Try citracal 1 pill daily

## 2015-03-18 ENCOUNTER — Other Ambulatory Visit: Payer: Self-pay | Admitting: Family Medicine

## 2015-03-18 ENCOUNTER — Other Ambulatory Visit: Payer: Self-pay | Admitting: Family

## 2015-03-18 NOTE — Telephone Encounter (Signed)
Rx called in and pt aware

## 2015-03-18 NOTE — Telephone Encounter (Signed)
Filled 03/04/15 for #15. Route to pool

## 2015-03-19 ENCOUNTER — Ambulatory Visit (INDEPENDENT_AMBULATORY_CARE_PROVIDER_SITE_OTHER): Payer: Medicare Other | Admitting: Internal Medicine

## 2015-03-26 ENCOUNTER — Encounter: Payer: Self-pay | Admitting: Family Medicine

## 2015-03-26 ENCOUNTER — Ambulatory Visit (INDEPENDENT_AMBULATORY_CARE_PROVIDER_SITE_OTHER): Payer: Medicare Other | Admitting: Family Medicine

## 2015-03-26 ENCOUNTER — Ambulatory Visit (INDEPENDENT_AMBULATORY_CARE_PROVIDER_SITE_OTHER): Payer: Medicare Other | Admitting: Pharmacist

## 2015-03-26 VITALS — BP 138/88 | HR 70 | Temp 98.1°F | Ht 63.0 in | Wt 140.4 lb

## 2015-03-26 DIAGNOSIS — I480 Paroxysmal atrial fibrillation: Secondary | ICD-10-CM

## 2015-03-26 DIAGNOSIS — N3000 Acute cystitis without hematuria: Secondary | ICD-10-CM

## 2015-03-26 DIAGNOSIS — J019 Acute sinusitis, unspecified: Secondary | ICD-10-CM | POA: Diagnosis not present

## 2015-03-26 DIAGNOSIS — R300 Dysuria: Secondary | ICD-10-CM | POA: Diagnosis not present

## 2015-03-26 DIAGNOSIS — L570 Actinic keratosis: Secondary | ICD-10-CM | POA: Diagnosis not present

## 2015-03-26 LAB — URINALYSIS
BILIRUBIN UA: NEGATIVE
GLUCOSE, UA: NEGATIVE
KETONES UA: NEGATIVE
Leukocytes, UA: NEGATIVE
Nitrite, UA: NEGATIVE
Protein, UA: NEGATIVE
SPEC GRAV UA: 1.015 (ref 1.005–1.030)
UUROB: 0.2 mg/dL (ref 0.2–1.0)
pH, UA: 6 (ref 5.0–7.5)

## 2015-03-26 LAB — COAGUCHEK XS/INR WAIVED
INR: 2.8 — AB (ref 0.9–1.1)
PROTHROMBIN TIME: 33.5 s

## 2015-03-26 MED ORDER — DOXYCYCLINE HYCLATE 100 MG PO TABS: 100.0000 mg | ORAL_TABLET | Freq: Two times a day (BID) | ORAL | Status: DC

## 2015-03-26 NOTE — Progress Notes (Signed)
BP 138/88 mmHg  Pulse 70  Temp(Src) 98.1 F (36.7 C) (Oral)  Ht 5\' 3"  (1.6 m)  Wt 140 lb 6.4 oz (63.685 kg)  BMI 24.88 kg/m2   Subjective:    Patient ID: [name], female    DOB: [birth year], 82 y.o.   MRN: [id number]  HPI: [name] is a 82 y.o. female presenting on 03/26/2015 for Urinary Tract Infection; Sinusitis; Cough; and Lesion on left lower, arm   HPI Dysuria and hematuria Patient comes in to today because she is having dysuria and hematuria is been going on for the past couple days. She has a history of multiple UTIs in the past. She has had a low-grade abdominal pain in the suprapubic region. She denies any fevers or chills or flank pain.   Sinus congestion and nasal congestion Patient has been having sinus congestion and nasal congestion is been going on for the past couple days. She denies any fevers or chills. She is having postnasal drainage and a cough which is productive of yellow-green sputum. She denies any shortness of breath or wheezing. She is also having a little pressure in her left ear.  Skin lesion Patient also has complaints of a skin lesion has been there for a couple of weeks on her left arm she feels like she might a bump to and then it has built up since that time. The lesion is non-pruritic and is elevated and is white to clear and she has not had any drainage from it. His never had this before.  Relevant past medical, surgical, family and social history reviewed and updated as indicated. Interim medical history since our last visit reviewed. Allergies and medications reviewed and updated.  Review of Systems  Constitutional: Negative for fever and chills.  HENT: Positive for postnasal drip, rhinorrhea, sinus pressure, sneezing and sore throat. Negative for congestion, ear discharge and ear pain.   Eyes: Negative for pain, redness and visual disturbance.  Respiratory: Negative for chest tightness and shortness of breath.   Cardiovascular:  Negative for chest pain and leg swelling.  Gastrointestinal: Positive for abdominal pain.  Genitourinary: Positive for dysuria, frequency, hematuria and flank pain. Negative for urgency, decreased urine volume, difficulty urinating and menstrual problem.  Musculoskeletal: Negative for back pain and gait problem.  Skin: Positive for rash.  Neurological: Negative for light-headedness and headaches.  Psychiatric/Behavioral: Negative for behavioral problems and agitation.  All other systems reviewed and are negative.  Per HPI unless specifically indicated above    Medication List       This list is accurate as of: 03/26/15  7:11 PM.  Always use your most recent med list.               acetaminophen 500 MG tablet  Commonly known as:  TYLENOL  Take 500 mg by mouth every 6 (six) hours as needed for moderate pain.     atenolol 25 MG tablet  Commonly known as:  TENORMIN  Take 25 mg by mouth daily. Reported on 01/27/2015     doxycycline 100 MG tablet  Commonly known as:  VIBRA-TABS  Take 1 tablet (100 mg total) by mouth 2 (two) times daily. 1 po bid     ergocalciferol 50000 units capsule  Commonly known as:  VITAMIN D2  Take 50,000 Units by mouth every Sunday. Reported on 01/27/2015     flecainide 100 MG tablet  Commonly known as:  TAMBOCOR  Take 1 tablet (100 mg total) by mouth  2 (two) times daily.     furosemide 40 MG tablet  Commonly known as:  LASIX  Take 0.5-1 tablets (20-40 mg total) by mouth daily. Take 1/2 tablet (20 mg) by mouth every morning, take 1 tablet (40 mg) if stomach and legs are swollen with fluid     GARLIC PO  Take 1 tablet by mouth daily after supper.     levothyroxine 50 MCG tablet  Commonly known as:  SYNTHROID, LEVOTHROID  Take 50 mcg by mouth daily before breakfast.     lisinopril 10 MG tablet  Commonly known as:  PRINIVIL,ZESTRIL  Take 1 tablet (10 mg total) by mouth daily.     mupirocin ointment 2 %  Commonly known as:  BACTROBAN  APPLY INTO  NOSTRIL(S) 2 TIMES A DAY AS DIRECTED     potassium chloride SA 20 MEQ tablet  Commonly known as:  K-DUR,KLOR-CON  Take 20 mEq by mouth every morning. Take with food     vitamin B-12 500 MCG tablet  Commonly known as:  CYANOCOBALAMIN  Take 500 mcg by mouth 3 (three) times a week.     warfarin 5 MG tablet  Commonly known as:  COUMADIN  Take 5 mg by mouth every morning.     zolpidem 10 MG tablet  Commonly known as:  AMBIEN  TAKE 1/2 TO 1 TABLET AT BEDTIME AS NEEDED FOR INSOMNIA           Objective:    BP 138/88 mmHg  Pulse 70  Temp(Src) 98.1 F (36.7 C) (Oral)  Ht 5\' 3"  (1.6 m)  Wt 140 lb 6.4 oz (63.685 kg)  BMI 24.88 kg/m2  Wt Readings from Last 3 Encounters:  03/26/15 140 lb 6.4 oz (63.685 kg)  03/05/15 139 lb 6.4 oz (63.231 kg)  03/03/15 140 lb (63.504 kg)    Physical Exam  Constitutional: She is oriented to person, place, and time. She appears well-developed and well-nourished. No distress.  HENT:  Right Ear: Tympanic membrane, external ear and ear canal normal.  Left Ear: Tympanic membrane, external ear and ear canal normal.  Nose: Mucosal edema and rhinorrhea present. No epistaxis. Right sinus exhibits no maxillary sinus tenderness and no frontal sinus tenderness. Left sinus exhibits no maxillary sinus tenderness and no frontal sinus tenderness.  Mouth/Throat: Uvula is midline and mucous membranes are normal. Posterior oropharyngeal edema and posterior oropharyngeal erythema present. No oropharyngeal exudate or tonsillar abscesses.  Eyes: Conjunctivae and EOM are normal.  Neck: Neck supple. No thyromegaly present.  Cardiovascular: Normal rate, regular rhythm, normal heart sounds and intact distal pulses.   No murmur heard. Pulmonary/Chest: Effort normal and breath sounds normal. No respiratory distress. She has no wheezes.  Musculoskeletal: Normal range of motion. She exhibits no edema or tenderness.  Lymphadenopathy:    She has no cervical adenopathy.    Neurological: She is alert and oriented to person, place, and time. Coordination normal.  Skin: Skin is warm and dry. Lesion (Raised lesion with keratotic appearance and has a stuck on appearance. No drainage or erythema or warmth noted. The lesion appears to be a hyperkeratotic in nature.) noted. No rash noted. She is not diaphoretic.  Psychiatric: She has a normal mood and affect. Her behavior is normal.  Vitals reviewed.  Results for orders placed or performed in visit on 03/26/15  CoaguChek XS/INR Waived  Result Value Ref Range   INR 2.8 (H) 0.9 - 1.1   Prothrombin Time 33.5 sec      Assessment &  Plan:   Problem List Items Addressed This Visit    None    Visit Diagnoses    Dysuria    -  Primary    Relevant Medications    doxycycline (VIBRA-TABS) 100 MG tablet    Other Relevant Orders    Urinalysis (Completed)    Acute cystitis without hematuria        Relevant Medications    doxycycline (VIBRA-TABS) 100 MG tablet    Acute rhinosinusitis        Relevant Medications    doxycycline (VIBRA-TABS) 100 MG tablet    Keratotic lesion        Left arm keratotic lesion recheck in 1 week        Follow up plan: Return in about 1 week (around 04/02/2015), or if symptoms worsen or fail to improve, for Recheck skin lesion and INR.  Counseling provided for all of the vaccine components Orders Placed This Encounter  Procedures  . Urinalysis    Caryl Pina, MD [facility] 03/26/2015, 7:11 PM

## 2015-03-26 NOTE — Progress Notes (Signed)
Subjective:     Indication: atrial fibrillation Bleeding signs/symptoms: None Thromboembolic signs/symptoms: None  Missed Coumadin doses: None Medication changes: no Dietary changes: no Bacterial/viral infection: yes - patient c/o urinary burning / dysuria    Objective:    INR Today: 2.8 Current dose: warfarin 5mg  1 tablet daily  Assessment:    Therapeutic INR for goal of 2-3   Plan:    1. New dose: no change   2. Next INR: 1 month   3.  appt made to see after hours clinic tonight for r/o UTI

## 2015-04-02 ENCOUNTER — Ambulatory Visit (INDEPENDENT_AMBULATORY_CARE_PROVIDER_SITE_OTHER): Payer: Medicare Other | Admitting: Family Medicine

## 2015-04-02 ENCOUNTER — Encounter: Payer: Self-pay | Admitting: Family Medicine

## 2015-04-02 ENCOUNTER — Ambulatory Visit: Payer: Medicare Other | Admitting: Family Medicine

## 2015-04-02 VITALS — BP 139/65 | HR 64 | Temp 98.0°F | Ht 63.0 in | Wt 139.8 lb

## 2015-04-02 DIAGNOSIS — L989 Disorder of the skin and subcutaneous tissue, unspecified: Secondary | ICD-10-CM

## 2015-04-02 DIAGNOSIS — R300 Dysuria: Secondary | ICD-10-CM

## 2015-04-02 DIAGNOSIS — R1030 Lower abdominal pain, unspecified: Secondary | ICD-10-CM | POA: Diagnosis not present

## 2015-04-02 LAB — URINALYSIS, COMPLETE
BILIRUBIN UA: NEGATIVE
Glucose, UA: NEGATIVE
Ketones, UA: NEGATIVE
NITRITE UA: NEGATIVE
PH UA: 6 (ref 5.0–7.5)
Protein, UA: NEGATIVE
Specific Gravity, UA: 1.015 (ref 1.005–1.030)
Urobilinogen, Ur: 0.2 mg/dL (ref 0.2–1.0)

## 2015-04-02 LAB — MICROSCOPIC EXAMINATION: BACTERIA UA: NONE SEEN

## 2015-04-02 NOTE — Progress Notes (Signed)
   HPI  Patient presents today for follow-up UTI and skin lesion.  UTI Patient was diagnosed with likely UTI last week, she is almost completed a doxycycline course. She describes low-grade suprapubic pain, described as achy mild pain. No dysuria. No fever, chills, sweats. She has not had any gross hematuria.  Skin lesion Improving Started out with another lesion that were both blisterlike, the blister resolved in developed a scab. She states that it's much better since last week.  PMH: Smoking status noted ROS: Per HPI  Objective: BP 139/65 mmHg  Pulse 64  Temp(Src) 98 F (36.7 C) (Oral)  Ht 5\' 3"  (1.6 m)  Wt 139 lb 12.8 oz (63.413 kg)  BMI 24.77 kg/m2 Gen: NAD, alert, cooperative with exam HEENT: NCAT CV: RRR, good S1/S2, no murmur Resp: CTABL, no wheezes, non-labored Abd: SNTND, BS present, no guarding or organomegaly, no CVA tenderness Ext: No edema, warm Neuro: Alert and oriented, No gross deficits  Skin: 5 mm area of crusting on an erythematous base on her distal forearm, no tenderness to palpation, no fluctuance, no induration  Assessment and plan:  # Suprapubic pressure Unlikely UTI, she has trace leukocytes's wife have sent this for culture. Advised finish doxycycline and await culture results Consider bladder spasms  # Skin lesion Healing, possible squamous cell given that it's been there 3-4 weeks. Unlikely though, I believe this will heal up Continue to follow, would plan to refer her for biopsy given warfarin use    Orders Placed This Encounter  Procedures  . Urine culture  . Urinalysis, Complete     Laroy Apple, MD [facility] 04/02/2015, 2:07 PM

## 2015-04-03 ENCOUNTER — Ambulatory Visit (INDEPENDENT_AMBULATORY_CARE_PROVIDER_SITE_OTHER): Payer: Medicare Other | Admitting: Internal Medicine

## 2015-04-03 ENCOUNTER — Encounter (INDEPENDENT_AMBULATORY_CARE_PROVIDER_SITE_OTHER): Payer: Self-pay | Admitting: Internal Medicine

## 2015-04-03 VITALS — BP 122/60 | HR 72 | Temp 98.0°F | Ht 63.0 in | Wt 137.5 lb

## 2015-04-03 DIAGNOSIS — R197 Diarrhea, unspecified: Secondary | ICD-10-CM | POA: Diagnosis not present

## 2015-04-03 LAB — URINE CULTURE

## 2015-04-03 NOTE — Patient Instructions (Signed)
OV in 3 months. GI pathogen

## 2015-04-03 NOTE — Progress Notes (Signed)
Subjective:    Patient ID: [name], female    DOB: 1932/10/14, 82 y.o.   MRN: [id number]  HPI  Referred by Dr. Wendi Snipes for diarrhea. Was last seen 4 weeks ago. She has had urgency x 1 year. She usually has 4-5 BMs a day and they are always runny.  At her visit she wondered if she had C-diff. She recently underwent a cholecystectomy in January by Dr. Rosendo Gros for biliary colic. C difficile was positive in hospital.  She says after discharge from hospital she was still having diarrhea. Hx of C-difficile 01/2015 and she was covered with Vancomycin. I check a C -diff and it was negative.  She tells me she has had diarrhea a couple of times this week. She says it had a terrible smell. She does say her stools for the past couple of weeks were normal. Actually she says her stools are better.  She  had 2-3 stools yesterday and she had one stool today. She did take an Imodium today. She tells me her stool was loose this morning.   She says normally her stools are loose. Normally she has 2-3 stools without Imodium.  Appetite is good. No weight loss. No melena or BRRB. No abdominal pain.    Her last colonoscopy   was in 2012 yrs ago by Dr. Delfin Edis which revealed internal hemorrhoids, moderate diverticulosis, sessile polyp in the sigmoid colon.   No family hx of colon cancer.     No NSAIDS. except for a Baby ASA occasionally  Hx significant for atrial fib and maintained on Warfarin.   Colonoscopy Dr. Maurene Capes 2 /06/2010: internal hemorrhoids, moderate diverticulosis, sessile polyp in the sigmoid colon. Biopsy:Benign small bowel mucosa. No villous atrophy, inflammation or other abnormalities present.   01/13/2015 EGD: epigastric pain, nausea and vomiting: Dr. Lajuan Lines. Pyrtle. The mucosa of the esophagus appeared normal. $cm hiatal hernia. The mucosa of the stomach appeared normal. The duodenal mucosa showed no abnormalities in the bulb and 2nd part of the duodenum.    Review of Systems Past Medical  History  Diagnosis Date  . Atrial fibrillation (HCC)     Paroxysmal, Flecainide therapy  . Warfarin anticoagulation   . Hypothyroidism   . Carotid bruit     Doppler, December, 2009, no abnormality  . Cardiomegaly     EF normal, echo, 2003  //   EF 60-65%, echo, June 18, 123456, diastolic dysfunction,  . Normal nuclear stress test     Normal, 2001  . HLD (hyperlipidemia)   . HTN (hypertension)   . Osteoarthritis   . Calf pain     September, 2012, at rest  . PONV (postoperative nausea and vomiting)   . GERD (gastroesophageal reflux disease)   . Anemia   . Rectal bleeding 2001    diverticulosis and int hemorrhoids on 07/1999 and 02/2010 colonoscopies.  . Hypothyroidism   . Cataract   . Stroke ([place])   . Insomnia   . History of shingles     Past Surgical History  Procedure Laterality Date  . Total knee arthroplasty  05/26/10    right  . Total shoulder replacement  01/2010    left  . Shoulder open rotator cuff repair      bilateral  . Total knee arthroplasty  11/17/2010    Procedure: TOTAL KNEE ARTHROPLASTY;  Surgeon: Mauri Pole;  Location: [facility];  Service: Orthopedics;  Laterality: Left;  . Total knee arthroplasty      Right  .  Cataract extraction w/phaco Right 04/15/2014    Procedure: CATARACT EXTRACTION PHACO AND INTRAOCULAR LENS PLACEMENT (IOC);  Surgeon: Tonny Branch, MD;  Location: [facility];  Service: Ophthalmology;  Laterality: Right;  CDE: 13.30  . Cataract extraction w/phaco Left 05/13/2014    Procedure: CATARACT EXTRACTION PHACO AND INTRAOCULAR LENS PLACEMENT LEFT EYE;  Surgeon: Tonny Branch, MD;  Location: [facility];  Service: Ophthalmology;  Laterality: Left;  CDE:13.00  . Esophagogastroduodenoscopy N/A 01/13/2015    Procedure: ESOPHAGOGASTRODUODENOSCOPY (EGD);  Surgeon: Jerene Bears, MD;  Location: [facility] ENDOSCOPY;  Service: Endoscopy;  Laterality: N/A;  . Cholecystectomy N/A 01/15/2015    Procedure: LAPAROSCOPIC CHOLECYSTECTOMY;  Surgeon: Ralene Ok, MD;  Location: [facility];   Service: General;  Laterality: N/A;    Allergies  Allergen Reactions  . Penicillins Rash    Has patient had a PCN reaction causing immediate rash, facial/tongue/throat swelling, SOB or lightheadedness with hypotension: Yes Has patient had a PCN reaction causing severe rash involving mucus membranes or skin necrosis: No Has patient had a PCN reaction that required hospitalization No Has patient had a PCN reaction occurring within the last 10 years: No If all of the above answers are "NO", then may proceed with Cephalosporin use.    Current Outpatient Prescriptions on File Prior to Visit  Medication Sig Dispense Refill  . acetaminophen (TYLENOL) 500 MG tablet Take 500 mg by mouth every 6 (six) hours as needed for moderate pain.    Marland Kitchen atenolol (TENORMIN) 25 MG tablet Take 25 mg by mouth daily. Reported on 01/27/2015    . doxycycline (VIBRA-TABS) 100 MG tablet Take 1 tablet (100 mg total) by mouth 2 (two) times daily. 1 po bid 20 tablet 0  . ergocalciferol (VITAMIN D2) 50000 UNITS capsule Take 50,000 Units by mouth every Sunday. Reported on 01/27/2015    . flecainide (TAMBOCOR) 100 MG tablet Take 1 tablet (100 mg total) by mouth 2 (two) times daily. 60 tablet 0  . furosemide (LASIX) 40 MG tablet Take 0.5-1 tablets (20-40 mg total) by mouth daily. Take 1/2 tablet (20 mg) by mouth every morning, take 1 tablet (40 mg) if stomach and legs are swollen with fluid 90 tablet 1  . GARLIC PO Take 1 tablet by mouth daily after supper.     . levothyroxine (SYNTHROID, LEVOTHROID) 50 MCG tablet Take 50 mcg by mouth daily before breakfast.     . lisinopril (PRINIVIL,ZESTRIL) 10 MG tablet Take 1 tablet (10 mg total) by mouth daily. (Patient taking differently: Take 10 mg by mouth every evening. ) 30 tablet 5  . mupirocin ointment (BACTROBAN) 2 % APPLY INTO NOSTRIL(S) 2 TIMES A DAY AS DIRECTED 22 g 0  . potassium chloride SA (K-DUR,KLOR-CON) 20 MEQ tablet Take 20 mEq by mouth every morning. Take with food    .  vitamin B-12 (CYANOCOBALAMIN) 500 MCG tablet Take 500 mcg by mouth 3 (three) times a week.     . warfarin (COUMADIN) 5 MG tablet Take 5 mg by mouth every morning.     . zolpidem (AMBIEN) 10 MG tablet TAKE 1/2 TO 1 TABLET AT BEDTIME AS NEEDED FOR INSOMNIA 15 tablet 2   No current facility-administered medications on file prior to visit.        Objective:   Physical Exam Blood pressure 122/60, pulse 72, temperature 98 F (36.7 C), height 5\' 3"  (1.6 m), weight 137 lb 8 oz (62.37 kg). Alert and oriented. Skin warm and dry. Oral mucosa is moist.   . Sclera anicteric,  conjunctivae is pink. Thyroid not enlarged. No cervical lymphadenopathy. Lungs clear. Heart regular rate and rhythm.  Abdomen is soft. Bowel sounds are positive. No hepatomegaly. No abdominal masses felt. No tenderness.  No edema to lower extremities.  No stool and guaiac negative.     Lot [id number] Ex 9/17       Assessment & Plan:  Diarrhea.  Hx of C-difficile. Will recheck a c difficile.  Samples of IBGARD given to patient.  Sample of [place]. OV in 3 months.

## 2015-04-07 DIAGNOSIS — R197 Diarrhea, unspecified: Secondary | ICD-10-CM | POA: Diagnosis not present

## 2015-04-08 LAB — GASTROINTESTINAL PATHOGEN PANEL PCR
C. DIFFICILE TOX A/B, PCR: NEGATIVE
CAMPYLOBACTER, PCR: NEGATIVE
CRYPTOSPORIDIUM, PCR: NEGATIVE
E coli (ETEC) LT/ST PCR: NEGATIVE
E coli (STEC) stx1/stx2, PCR: NEGATIVE
E coli 0157, PCR: NEGATIVE
GIARDIA LAMBLIA, PCR: NEGATIVE
Norovirus, PCR: NEGATIVE
ROTAVIRUS, PCR: NEGATIVE
Salmonella, PCR: NEGATIVE
Shigella, PCR: NEGATIVE

## 2015-04-11 ENCOUNTER — Other Ambulatory Visit: Payer: Self-pay | Admitting: *Deleted

## 2015-04-11 MED ORDER — FUROSEMIDE 40 MG PO TABS: 20.0000 mg | ORAL_TABLET | Freq: Every day | ORAL | Status: DC

## 2015-04-11 NOTE — Telephone Encounter (Signed)
Pt was just seen 03/05/15 with Dr. Wendi Snipes. Rx sent into pharmacy.

## 2015-04-14 ENCOUNTER — Ambulatory Visit: Payer: Medicare Other | Admitting: Family Medicine

## 2015-04-14 ENCOUNTER — Telehealth: Payer: Self-pay | Admitting: Family Medicine

## 2015-04-14 ENCOUNTER — Ambulatory Visit (INDEPENDENT_AMBULATORY_CARE_PROVIDER_SITE_OTHER): Payer: Medicare Other | Admitting: Family Medicine

## 2015-04-14 ENCOUNTER — Encounter: Payer: Self-pay | Admitting: Family Medicine

## 2015-04-14 VITALS — BP 147/58 | HR 64 | Temp 97.1°F | Ht 63.0 in | Wt 139.6 lb

## 2015-04-14 DIAGNOSIS — R350 Frequency of micturition: Secondary | ICD-10-CM

## 2015-04-14 LAB — URINALYSIS, COMPLETE
Bilirubin, UA: NEGATIVE
GLUCOSE, UA: NEGATIVE
Ketones, UA: NEGATIVE
NITRITE UA: NEGATIVE
PH UA: 6.5 (ref 5.0–7.5)
Protein, UA: NEGATIVE
Specific Gravity, UA: 1.01 (ref 1.005–1.030)
UUROB: 0.2 mg/dL (ref 0.2–1.0)

## 2015-04-14 LAB — MICROSCOPIC EXAMINATION: BACTERIA UA: NONE SEEN

## 2015-04-14 MED ORDER — OXYBUTYNIN CHLORIDE 5 MG PO TABS: 5.0000 mg | ORAL_TABLET | Freq: Three times a day (TID) | ORAL | Status: DC

## 2015-04-14 NOTE — Patient Instructions (Signed)
Great to see you!  I am treating you for overactive bladder, In the meantime I am also sendin the urine for a culture to see if there is any infection there.   Overactive Bladder, Adult Overactive bladder is a group of urinary symptoms. With overactive bladder, you may suddenly feel the need to pass urine (urinate) right away. After feeling this sudden urge, you might also leak urine if you cannot get to the bathroom fast enough (urinary incontinence). These symptoms might interfere with your daily work or social activities. Overactive bladder symptoms may also wake you up at night. Overactive bladder affects the nerve signals between your bladder and your brain. Your bladder may get the signal to empty before it is full. Very sensitive muscles can also make your bladder squeeze too soon. CAUSES Many things can cause an overactive bladder. Possible causes include:  Urinary tract infection.  Infection of nearby tissues, such as the prostate.  Prostate enlargement.  Being pregnant with twins or more (multiples).  Surgery on the uterus or urethra.  Bladder stones, inflammation, or tumors.  Drinking too much caffeine or alcohol.  Certain medicines, especially those that you take to help your body get rid of extra fluid (diuretics) by increasing urine production.  Muscle or nerve weakness, especially from:  A spinal cord injury.  Stroke.  Multiple sclerosis.  Parkinson disease.  Diabetes. This can cause a high urine volume that fills the bladder so quickly that the normal urge to urinate is triggered very strongly.  Constipation. A buildup of too much stool can put pressure on your bladder. RISK FACTORS You may be at greater risk for overactive bladder if you:  Are an older adult.  Smoke.  Are going through menopause.  Have prostate problems.  Have a neurological disease, such as stroke, dementia, Parkinson disease, or multiple sclerosis (MS).  Eat or drink things that  irritate the bladder. These include alcohol, spicy food, and caffeine.  Are overweight or obese. SIGNS AND SYMPTOMS  The signs and symptoms of an overactive bladder include:  Sudden, strong urges to urinate.  Leaking urine.  Urinating eight or more times per day.  Waking up to urinate two or more times per night. DIAGNOSIS Your health care provider may suspect overactive bladder based on your symptoms. The health care provider will do a physical exam and take your medical history. Blood or urine tests may also be done. For example, you might need to have a bladder function test to check how well you can hold your urine. You might also need to see a health care provider who specializes in the urinary tract (urologist). TREATMENT Treatment for overactive bladder depends on the cause of your condition and whether it is mild or severe. Certain treatments can be done in your health care provider's office or clinic. You can also make lifestyle changes at home. Options include: Behavioral Treatments  Biofeedback. A specialist uses sensors to help you become aware of your body's signals.  Keeping a daily log of when you need to urinate and what happens after the urge. This may help you manage your condition.  Bladder training. This helps you learn to control the urge to urinate by following a schedule that directs you to urinate at regular intervals (timed voiding). At first, you might have to wait a few minutes after feeling the urge. In time, you should be able to schedule bathroom visits an hour or more apart.  Kegel exercises. These are exercises to strengthen the  pelvic floor muscles, which support the bladder. Toning these muscles can help you control urination, even if your bladder muscles are overactive. A specialist will teach you how to do these exercises correctly. They require daily practice.  Weight loss. If you are obese or overweight, losing weight might relieve your symptoms of  overactive bladder. Talk to your health care provider about losing weight and whether there is a specific program or method that would work best for you.  Diet change. This might help if constipation is making your overactive bladder worse. Your health care provider or a dietitian can explain ways to change what you eat to ease constipation. You might also need to consume less alcohol and caffeine or drink other fluids at different times of the day.  Stopping smoking.  Wearing pads to absorb leakage while you wait for other treatments to take effect. Physical Treatments  Electrical stimulation. Electrodes send gentle pulses of electricity to strengthen the nerves or muscles that help to control the bladder. Sometimes, the electrodes are placed outside of the body. In other cases, they might be placed inside the body (implanted). This treatment can take several months to have an effect.  Supportive devices. Women may need a plastic device that fits into the vagina and supports the bladder (pessary). Medicines Several medicines can help treat overactive bladder and are usually used along with other treatments. Some are injected into the muscles involved in urination. Others come in pill form. Your health care provider may prescribe:  Antispasmodics. These medicines block the signals that the nerves send to the bladder. This keeps the bladder from releasing urine at the wrong time.  Tricyclic antidepressants. These types of antidepressants also relax bladder muscles. Surgery  You may have a device implanted to help manage the nerve signals that indicate when you need to urinate.  You may have surgery to implant electrodes for electrical stimulation.  Sometimes, very severe cases of overactive bladder require surgery to change the shape of the bladder. HOME CARE INSTRUCTIONS   Take medicines only as directed by your health care provider.  Use any implants or a pessary as directed by your  health care provider.  Make any diet or lifestyle changes that are recommended by your health care provider. These might include:  Drinking less fluid or drinking at different times of the day. If you need to urinate often during the night, you may need to stop drinking fluids early in the evening.  Cutting down on caffeine or alcohol. Both can make an overactive bladder worse. Caffeine is found in coffee, tea, and sodas.  Doing Kegel exercises to strengthen muscles.  Losing weight if you need to.  Eating a healthy and balanced diet to prevent constipation.  Keep a journal or log to track how much and when you drink and also when you feel the need to urinate. This will help your health care provider to monitor your condition. SEEK MEDICAL CARE IF:  Your symptoms do not get better after treatment.  Your pain and discomfort are getting worse.  You have more frequent urges to urinate.  You have a fever. SEEK IMMEDIATE MEDICAL CARE IF: You are not able to control your bladder at all.   This information is not intended to replace advice given to you by your health care provider. Make sure you discuss any questions you have with your health care provider.   Document Released: 10/17/2008 Document Revised: 01/11/2014 Document Reviewed: 05/16/2013 Elsevier Interactive Patient Education 2016 [employer]  

## 2015-04-14 NOTE — Progress Notes (Signed)
   HPI  Patient presents today here with urinary frequency.  Patient's lines over the last 1 week or so she's had urinary frequency and lower abdominal pain. Below her abdominal pain seems to precede need to urinate. She feels her she empties her bladder well whenever she goes to the bathroom. She did have improvement of symptoms while she was on doxycycline.  She denies any fever, chills, sweats, decreased appetite.  Skin lesion Not resolving. No changes.   PMH: Smoking status noted ROS: Per HPI  Objective: BP 147/58 mmHg  Pulse 64  Temp(Src) 97.1 F (36.2 C) (Oral)  Ht 5\' 3"  (1.6 m)  Wt 139 lb 9.6 oz (63.322 kg)  BMI 24.74 kg/m2 Gen: NAD, alert, cooperative with exam HEENT: NCAT, CV: RRR, good S1/S2, no murmur Resp: CTABL, no wheezes, non-labored Abd: SNTND, BS present, no guarding or organomegaly, mild suprapubic pressure Ext: No edema, warm Neuro: Alert and oriented, No gross deficits  Assessment and plan:  # urinary frequency Likely OAB Start myrbetriq, 4 weeks samples given--- After myrbetric given it was noted there is interaction possibly causing QT prolongation with flecanide. The patient was contacted by nursing and told not take the medicine. Ditropan sent instead Appears to be covered by insurance Culture to insure no UTI, UA and micro reassuring      Orders Placed This Encounter  Procedures  . Urine culture  . Urinalysis, Complete    Meds ordered this encounter  Medications  . oxybutynin (DITROPAN) 5 MG tablet    Sig: Take 1 tablet (5 mg total) by mouth 3 (three) times daily.    Dispense:  90 tablet    Refill:  [place], MD [facility] 04/14/2015, 3:15 PM

## 2015-04-14 NOTE — Telephone Encounter (Signed)
Need details of problems with UTI?  Patient had a urine culture that was normal twelve days earlier.

## 2015-04-15 LAB — URINE CULTURE

## 2015-04-15 NOTE — Telephone Encounter (Signed)
Patient was seen in office on 04/14/15 for urinary symptoms, by Dr. Wendi Snipes

## 2015-04-16 ENCOUNTER — Other Ambulatory Visit: Payer: Self-pay | Admitting: Family

## 2015-04-21 ENCOUNTER — Emergency Department (HOSPITAL_COMMUNITY): Payer: Medicare Other

## 2015-04-21 ENCOUNTER — Observation Stay (HOSPITAL_COMMUNITY)
Admission: EM | Admit: 2015-04-21 | Discharge: 2015-04-22 | Disposition: A | Discharge status: Home / Self Care | Payer: Medicare Other | Attending: Internal Medicine | Admitting: Internal Medicine

## 2015-04-21 ENCOUNTER — Observation Stay (HOSPITAL_BASED_OUTPATIENT_CLINIC_OR_DEPARTMENT_OTHER): Payer: Medicare Other

## 2015-04-21 ENCOUNTER — Observation Stay (HOSPITAL_COMMUNITY): Payer: Medicare Other

## 2015-04-21 ENCOUNTER — Encounter (HOSPITAL_COMMUNITY): Payer: Self-pay | Admitting: Emergency Medicine

## 2015-04-21 DIAGNOSIS — R4701 Aphasia: Principal | ICD-10-CM | POA: Insufficient documentation

## 2015-04-21 DIAGNOSIS — I639 Cerebral infarction, unspecified: Secondary | ICD-10-CM

## 2015-04-21 DIAGNOSIS — I6350 Cerebral infarction due to unspecified occlusion or stenosis of unspecified cerebral artery: Secondary | ICD-10-CM | POA: Diagnosis not present

## 2015-04-21 DIAGNOSIS — I10 Essential (primary) hypertension: Secondary | ICD-10-CM | POA: Insufficient documentation

## 2015-04-21 DIAGNOSIS — Z79899 Other long term (current) drug therapy: Secondary | ICD-10-CM | POA: Diagnosis not present

## 2015-04-21 DIAGNOSIS — I4891 Unspecified atrial fibrillation: Secondary | ICD-10-CM | POA: Diagnosis not present

## 2015-04-21 DIAGNOSIS — M1990 Unspecified osteoarthritis, unspecified site: Secondary | ICD-10-CM | POA: Insufficient documentation

## 2015-04-21 DIAGNOSIS — I519 Heart disease, unspecified: Secondary | ICD-10-CM

## 2015-04-21 DIAGNOSIS — Z8673 Personal history of transient ischemic attack (TIA), and cerebral infarction without residual deficits: Secondary | ICD-10-CM | POA: Diagnosis not present

## 2015-04-21 DIAGNOSIS — R0989 Other specified symptoms and signs involving the circulatory and respiratory systems: Secondary | ICD-10-CM | POA: Diagnosis present

## 2015-04-21 DIAGNOSIS — Z7901 Long term (current) use of anticoagulants: Secondary | ICD-10-CM | POA: Diagnosis not present

## 2015-04-21 DIAGNOSIS — E785 Hyperlipidemia, unspecified: Secondary | ICD-10-CM | POA: Insufficient documentation

## 2015-04-21 DIAGNOSIS — R4182 Altered mental status, unspecified: Secondary | ICD-10-CM | POA: Diagnosis not present

## 2015-04-21 DIAGNOSIS — I5032 Chronic diastolic (congestive) heart failure: Secondary | ICD-10-CM | POA: Diagnosis present

## 2015-04-21 LAB — CBC
HCT: 39.9 % (ref 36.0–46.0)
Hemoglobin: 13.6 g/dL (ref 12.0–15.0)
MCH: 31.3 pg (ref 26.0–34.0)
MCHC: 34.1 g/dL (ref 30.0–36.0)
MCV: 91.7 fL (ref 78.0–100.0)
PLATELETS: 312 10*3/uL (ref 150–400)
RBC: 4.35 MIL/uL (ref 3.87–5.11)
RDW: 14.2 % (ref 11.5–15.5)
WBC: 9.9 10*3/uL (ref 4.0–10.5)

## 2015-04-21 LAB — CBG MONITORING, ED: GLUCOSE-CAPILLARY: 114 mg/dL — AB (ref 65–99)

## 2015-04-21 LAB — COMPREHENSIVE METABOLIC PANEL
ALBUMIN: 4 g/dL (ref 3.5–5.0)
ALT: 12 U/L — AB (ref 14–54)
AST: 22 U/L (ref 15–41)
Alkaline Phosphatase: 46 U/L (ref 38–126)
Anion gap: 11 (ref 5–15)
BILIRUBIN TOTAL: 0.5 mg/dL (ref 0.3–1.2)
BUN: 10 mg/dL (ref 6–20)
CHLORIDE: 89 mmol/L — AB (ref 101–111)
CO2: 29 mmol/L (ref 22–32)
CREATININE: 0.78 mg/dL (ref 0.44–1.00)
Calcium: 8.6 mg/dL — ABNORMAL LOW (ref 8.9–10.3)
GFR calc Af Amer: >60 mL/min (ref >60)
GFR calc non Af Amer: >60 mL/min (ref >60)
GLUCOSE: 125 mg/dL — AB (ref 65–99)
POTASSIUM: 3.5 mmol/L (ref 3.5–5.1)
Sodium: 129 mmol/L — ABNORMAL LOW (ref 135–145)
TOTAL PROTEIN: 7.2 g/dL (ref 6.5–8.1)

## 2015-04-21 LAB — URINALYSIS, ROUTINE W REFLEX MICROSCOPIC
BILIRUBIN URINE: NEGATIVE
GLUCOSE, UA: NEGATIVE mg/dL
KETONES UR: NEGATIVE mg/dL
Leukocytes, UA: NEGATIVE
Nitrite: NEGATIVE
PH: 6.5 (ref 5.0–8.0)
Protein, ur: NEGATIVE mg/dL

## 2015-04-21 LAB — DIFFERENTIAL
Basophils Absolute: 0 10*3/uL (ref 0.0–0.1)
Basophils Relative: 0 %
EOS ABS: 0 10*3/uL (ref 0.0–0.7)
Eosinophils Relative: 0 %
LYMPHS ABS: 1.3 10*3/uL (ref 0.7–4.0)
Lymphocytes Relative: 13 %
MONOS PCT: 5 %
Monocytes Absolute: 0.5 10*3/uL (ref 0.1–1.0)
NEUTROS ABS: 8.1 10*3/uL — AB (ref 1.7–7.7)
Neutrophils Relative %: 82 %

## 2015-04-21 LAB — ECHOCARDIOGRAM COMPLETE
HEIGHTINCHES: 63 in
Weight: 2118.4 oz

## 2015-04-21 LAB — URINE MICROSCOPIC-ADD ON
Bacteria, UA: NONE SEEN
RBC / HPF: NONE SEEN RBC/hpf (ref 0–5)
Squamous Epithelial / LPF: NONE SEEN

## 2015-04-21 LAB — I-STAT TROPONIN, ED: TROPONIN I, POC: 0.02 ng/mL (ref 0.00–0.08)

## 2015-04-21 LAB — I-STAT CHEM 8, ED
BUN: 10 mg/dL (ref 6–20)
CALCIUM ION: 1.06 mmol/L — AB (ref 1.13–1.30)
CREATININE: 0.8 mg/dL (ref 0.44–1.00)
Chloride: 86 mmol/L — ABNORMAL LOW (ref 101–111)
GLUCOSE: 120 mg/dL — AB (ref 65–99)
HCT: 45 % (ref 36.0–46.0)
Hemoglobin: 15.3 g/dL — ABNORMAL HIGH (ref 12.0–15.0)
Potassium: 3.5 mmol/L (ref 3.5–5.1)
SODIUM: 128 mmol/L — AB (ref 135–145)
TCO2: 30 mmol/L (ref 0–100)

## 2015-04-21 LAB — APTT: aPTT: 37 seconds (ref 24–37)

## 2015-04-21 LAB — PROTIME-INR
INR: 2.01 — ABNORMAL HIGH (ref 0.00–1.49)
PROTHROMBIN TIME: 22.6 s — AB (ref 11.6–15.2)

## 2015-04-21 MED ORDER — ASPIRIN 300 MG RE SUPP: 300.0000 mg | Freq: Every day | RECTAL | Status: DC

## 2015-04-21 MED ORDER — LEVOTHYROXINE SODIUM 50 MCG PO TABS
50.0000 ug | ORAL_TABLET | Freq: Every day | ORAL | Status: DC
  Administered 2015-04-22: 50 ug via ORAL
  Filled 2015-04-21: qty 1

## 2015-04-21 MED ORDER — FLECAINIDE ACETATE 100 MG PO TABS
100.0000 mg | ORAL_TABLET | Freq: Two times a day (BID) | ORAL | Status: DC
  Administered 2015-04-21 – 2015-04-22 (×2): 100 mg via ORAL
  Filled 2015-04-21 (×4): qty 1

## 2015-04-21 MED ORDER — SODIUM CHLORIDE 0.9% FLUSH
3.0000 mL | Freq: Two times a day (BID) | INTRAVENOUS | Status: DC
  Administered 2015-04-21 – 2015-04-22 (×2): 3 mL via INTRAVENOUS

## 2015-04-21 MED ORDER — ASPIRIN 325 MG PO TABS
325.0000 mg | ORAL_TABLET | Freq: Once | ORAL | Status: AC
  Administered 2015-04-21: 325 mg via ORAL

## 2015-04-21 MED ORDER — ATENOLOL 25 MG PO TABS
25.0000 mg | ORAL_TABLET | Freq: Every day | ORAL | Status: DC
  Administered 2015-04-22: 25 mg via ORAL
  Filled 2015-04-21: qty 1

## 2015-04-21 MED ORDER — SODIUM CHLORIDE 0.9% FLUSH
3.0000 mL | INTRAVENOUS | Status: DC | PRN
Start: 2015-04-21 — End: 2015-04-22

## 2015-04-21 MED ORDER — ZOLPIDEM TARTRATE 5 MG PO TABS
5.0000 mg | ORAL_TABLET | Freq: Every evening | ORAL | Status: DC | PRN
  Administered 2015-04-21: 5 mg via ORAL
  Filled 2015-04-21: qty 1

## 2015-04-21 MED ORDER — ATORVASTATIN CALCIUM 40 MG PO TABS
40.0000 mg | ORAL_TABLET | Freq: Every day | ORAL | Status: DC
  Administered 2015-04-21: 40 mg via ORAL
  Filled 2015-04-21: qty 1

## 2015-04-21 MED ORDER — ASPIRIN 325 MG PO TABS: 325.0000 mg | ORAL_TABLET | Freq: Every day | ORAL | Status: DC

## 2015-04-21 MED ORDER — OXYBUTYNIN CHLORIDE 5 MG PO TABS
5.0000 mg | ORAL_TABLET | Freq: Three times a day (TID) | ORAL | Status: DC
  Administered 2015-04-21 – 2015-04-22 (×3): 5 mg via ORAL
  Filled 2015-04-21 (×3): qty 1

## 2015-04-21 MED ORDER — SENNOSIDES-DOCUSATE SODIUM 8.6-50 MG PO TABS: 1.0000 | ORAL_TABLET | Freq: Every evening | ORAL | Status: DC | PRN

## 2015-04-21 MED ORDER — WARFARIN - PHARMACIST DOSING INPATIENT: Status: DC

## 2015-04-21 MED ORDER — VITAMIN D (ERGOCALCIFEROL) 1.25 MG (50000 UNIT) PO CAPS: 50000.0000 [IU] | ORAL_CAPSULE | ORAL | Status: DC

## 2015-04-21 MED ORDER — SODIUM CHLORIDE 0.9 % IV SOLN: 250.0000 mL | INTRAVENOUS | Status: DC | PRN

## 2015-04-21 MED ORDER — ACETAMINOPHEN 325 MG PO TABS
650.0000 mg | ORAL_TABLET | Freq: Four times a day (QID) | ORAL | Status: DC | PRN
  Administered 2015-04-21: 650 mg via ORAL
  Filled 2015-04-21: qty 2

## 2015-04-21 MED ORDER — ASPIRIN 325 MG PO TABS
ORAL_TABLET | ORAL | Status: AC
  Administered 2015-04-21: 325 mg via ORAL
  Filled 2015-04-21: qty 1

## 2015-04-21 MED ORDER — ENSURE ENLIVE PO LIQD
237.0000 mL | Freq: Two times a day (BID) | ORAL | Status: DC
  Administered 2015-04-21 – 2015-04-22 (×2): 237 mL via ORAL

## 2015-04-21 MED ORDER — STROKE: EARLY STAGES OF RECOVERY BOOK
Freq: Once | Status: AC
  Administered 2015-04-21: 1
  Filled 2015-04-21: qty 1

## 2015-04-21 NOTE — ED Notes (Signed)
Hospitalist at bedside 

## 2015-04-21 NOTE — H&P (Signed)
[facility] History and Physical  TRANY RADDEN [id number] DOB: 1932-09-12    PCP:   Kenn File, MD   Chief Complaint: aphasia and confusion.   HPI: [name] is an right handed  82 y.o. 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  Her ictus was over 12 hours ago.  She did not have upper or lower extremity weakness, or facial droop.  In the ER, her symptoms improved, with no confusion and was having mininal expressive aphasia.  Work up included a head CT which showed no acute process, old lacunar infarct, and EKG showed no afib.  Her serology was unremarkable except her Na was low at 128.  Hospitalist was asked to admit her for stroke work up.    Rewiew of Systems:  Constitutional: Negative for malaise, fever and chills. No significant weight loss or weight gain Eyes: Negative for eye pain, redness and discharge, diplopia, visual changes, or flashes of light. ENMT: Negative for ear pain, hoarseness, nasal congestion, sinus pressure and sore throat. No headaches; tinnitus, drooling, or problem swallowing. Cardiovascular: Negative for chest pain, palpitations, diaphoresis, dyspnea and peripheral edema. ; No orthopnea, PND Respiratory: Negative for cough, hemoptysis, wheezing and stridor. No pleuritic chestpain. Gastrointestinal: Negative for nausea, vomiting, diarrhea, constipation, abdominal pain, melena, blood in stool, hematemesis, jaundice and rectal bleeding.    Genitourinary: Negative for frequency, dysuria, incontinence,flank pain and hematuria; Musculoskeletal: Negative for back pain and neck  pain. Negative for swelling and trauma.;  Skin: . Negative for pruritus, rash, abrasions, bruising and skin lesion.; ulcerations Neuro: Negative for headache, lightheadedness and neck stiffness. Negative for weakness, altered level of consciousness , altered mental status, extremity weakness, burning feet, involuntary movement, seizure and syncope.  Psych: negative for anxiety, depression, insomnia, tearfulness, panic attacks, hallucinations, paranoia, suicidal or homicidal ideation    Past Medical History  Diagnosis Date  . Atrial fibrillation (HCC)     Paroxysmal, Flecainide therapy  . Warfarin anticoagulation   . Hypothyroidism   . Carotid bruit     Doppler, December, 2009, no abnormality  . Cardiomegaly     EF normal, echo, 2003  //   EF 60-65%, echo, June 18, 123456, diastolic dysfunction,  . Normal nuclear stress test     Normal, 2001  . HLD (hyperlipidemia)   . HTN (hypertension)   . Osteoarthritis   . Calf pain     September, 2012, at rest  . PONV (postoperative nausea and vomiting)   . GERD (gastroesophageal reflux disease)   . Anemia   . Rectal bleeding 2001    diverticulosis and int hemorrhoids on 07/1999 and 02/2010 colonoscopies.  . Hypothyroidism   . Cataract   . Stroke ([place])   . Insomnia   . History of shingles     Past Surgical History  Procedure Laterality Date  . Total knee arthroplasty  05/26/10    right  . Total shoulder replacement  01/2010    left  . Shoulder open rotator cuff repair      bilateral  . Total knee arthroplasty  11/17/2010    Procedure: TOTAL KNEE ARTHROPLASTY;  Surgeon: Rodman Key  Marian Sorrow;  Location: [facility];  Service: Orthopedics;  Laterality: Left;  . Total knee arthroplasty      Right  . Cataract extraction w/phaco Right 04/15/2014    Procedure: CATARACT EXTRACTION PHACO AND INTRAOCULAR LENS PLACEMENT (IOC);  Surgeon: Tonny Branch, MD;  Location: [facility];  Service: Ophthalmology;  Laterality: Right;  CDE: 13.30  . Cataract extraction w/phaco  Left 05/13/2014    Procedure: CATARACT EXTRACTION PHACO AND INTRAOCULAR LENS PLACEMENT LEFT EYE;  Surgeon: Tonny Branch, MD;  Location: [facility];  Service: Ophthalmology;  Laterality: Left;  CDE:13.00  . Esophagogastroduodenoscopy N/A 01/13/2015    Procedure: ESOPHAGOGASTRODUODENOSCOPY (EGD);  Surgeon: Jerene Bears, MD;  Location[facility] END[facility]COPY;  Service: Endoscopy;  Laterality: N/A;  . Cholecystectomy N/A 01/15/2015    Procedure: LAPAROSCOPIC CHOLECYSTECTOMY;  Surgeon: Ralene Ok, MD;  Location: [place];  Service: General;  Laterality: N/A;    Medications:  HOME MEDS: Prior to Admission medications   Medication Sig Start Date End Date Taking? Authorizing Provider  acetaminophen (TYLENOL) 500 MG tablet Take 500 mg by mouth every 6 (six) hours as needed for moderate pain.   Yes Historical Provider, MD  atenolol (TENORMIN) 25 MG tablet Take 25 mg by mouth daily. Reported on 01/27/2015   Yes Historical Provider, MD  ergocalciferol (VITAMIN D2) 50000 UNITS capsule Take 50,000 Units by mouth every Sunday. Reported on 01/27/2015   Yes Historical Provider, MD  flecainide (TAMBOCOR) 100 MG tablet Take 1 tablet (100 mg total) by mouth 2 (two) times daily. 01/28/15  Yes Timmothy Euler, MD  furosemide (LASIX) 40 MG tablet Take 0.5-1 tablets (20-40 mg total) by mouth daily. Take 1/2 tablet (20 mg) PO every morning, take 1 tablet (40 mg) PO if swelling occurs. Patient taking differently: Take 40 mg by mouth daily.  04/11/15  Yes Timmothy Euler, MD  GARLIC PO Take 1 tablet by mouth daily after supper.    Yes Historical Provider, MD  levothyroxine (SYNTHROID, LEVOTHROID) 50 MCG tablet Take 50 mcg by mouth daily before breakfast.  01/20/15  Yes Historical Provider, MD  lisinopril (PRINIVIL,ZESTRIL) 10 MG tablet Take 1 tablet (10 mg total) by mouth daily. Patient taking differently: Take 10 mg by mouth every evening.  02/19/15  Yes Timmothy Euler, MD  mupirocin ointment (BACTROBAN) 2 % APPLY INTO NOSTRIL(S) 2 TIMES  A DAY AS DIRECTED 03/18/15  Yes Timmothy Euler, MD  oxybutynin (DITROPAN) 5 MG tablet Take 1 tablet (5 mg total) by mouth 3 (three) times daily. 04/14/15  Yes Timmothy Euler, MD  potassium chloride SA (K-DUR,KLOR-CON) 20 MEQ tablet Take 20 mEq by mouth every morning. Take with food 10/19/12  Yes Historical Provider, MD  vitamin B-12 (CYANOCOBALAMIN) 500 MCG tablet Take 500 mcg by mouth every Monday, Wednesday, and Friday.    Yes Historical Provider, MD  warfarin (COUMADIN) 5 MG tablet Take 5 mg by mouth every morning.    Yes Historical Provider, MD  zolpidem (AMBIEN) 10 MG tablet TAKE 1/2 TO 1 TABLET AT BEDTIME AS NEEDED FOR INSOMNIA 03/18/15  Yes Timmothy Euler, MD     Allergies:  Allergies  Allergen Reactions  . Penicillins Rash    Has patient had a PCN reaction causing immediate rash, facial/tongue/throat swelling, SOB or lightheadedness with hypotension: Yes Has patient had a PCN reaction causing severe rash involving mucus membranes or skin necrosis: No Has patient had a PCN reaction that required hospitalization No Has patient had a PCN reaction occurring within the last  10 years: No If all of the above answers are "NO", then may proceed with Cephalosporin use.    Social History:   reports that she has never smoked. She has never used smokeless tobacco. She reports that she does not drink alcohol or use illicit drugs.  Family History: Family History  Problem Relation Age of Onset  . COPD Brother   . Atrial fibrillation Brother   . Diabetes Brother   . Liver cancer Brother     Alcohol-related  . Colon cancer Brother   . Heart attack Father   . Rheum arthritis Mother   . Arthritis Mother   . Heart disease Mother   . Heart attack Daughter   . Coronary artery disease Brother   . Gallbladder disease Brother      Physical Exam: Filed Vitals:   04/21/15 1103 04/21/15 1115 04/21/15 1130 04/21/15 1200  BP:   161/72 170/72  Pulse:  69    Temp: 98.2 F (36.8 C)      TempSrc:      Resp:  15 15 16   Height:      Weight:      SpO2:  96%     Blood pressure 170/72, pulse 69, temperature 98.2 F (36.8 C), resp. rate 16, height 5\' 1"  (1.549 m), weight 63.05 kg (139 lb), SpO2 96 %.  GEN:  Pleasant  patient lying in the stretcher in no acute distress; cooperative with exam. PSYCH:  alert and oriented x4; does not appear anxious or depressed; affect is appropriate. HEENT: Mucous membranes pink and anicteric; PERRLA; EOM intact; no cervical lymphadenopathy nor thyromegaly or carotid bruit; no JVD; There were no stridor. Neck is very supple. Breasts:: Not examined CHEST WALL: No tenderness CHEST: Normal respiration, clear to auscultation bilaterally.  HEART: Regular rate and rhythm.  There are no murmur, rub, or gallops.   BACK: No kyphosis or scoliosis; no CVA tenderness ABDOMEN: soft and non-tender; no masses, no organomegaly, normal abdominal bowel sounds; no pannus; no intertriginous candida. There is no rebound and no distention. Rectal Exam: Not done EXTREMITIES: No bone or joint deformity; age-appropriate arthropathy of the hands and knees; no edema; no ulcerations.  There is no calf tenderness. Genitalia: not examined PULSES: 2+ and symmetric SKIN: Normal hydration no rash or ulceration CNS: Cranial nerves 2-12 grossly intact no focal lateralizing neurologic deficit.  Speech is fluent; uvula elevated with phonation, facial symmetry and tongue midline. DTR are normal bilaterally, cerebella exam is intact, barbinski is negative and strengths are equaled bilaterally.  No sensory loss. Slight expressive aphasia.    Labs on Admission:  Basic Metabolic Panel:  Recent Labs Lab 04/21/15 1026 04/21/15 1027  NA 129* 128*  K 3.5 3.5  CL 89* 86*  CO2 29  --   GLUCOSE 125* 120*  BUN 10 10  CREATININE 0.78 0.80  CALCIUM 8.6*  --    Liver Function Tests:  Recent Labs Lab 04/21/15 1026  AST 22  ALT 12*  ALKPHOS 46  BILITOT 0.5  PROT 7.2  ALBUMIN  4.0   CBC:  Recent Labs Lab 04/21/15 1026 04/21/15 1027  WBC 9.9  --   NEUTROABS 8.1*  --   HGB 13.6 15.3*  HCT 39.9 45.0  MCV 91.7  --   PLT 312  --    CBG:  Recent Labs Lab 04/21/15 1034  GLUCAP 114*     Radiological Exams on Admission: Ct Head Wo Contrast  04/21/2015  CLINICAL DATA:  Acute onset of  altered mental status at 6 a.m. today. EXAM: CT HEAD WITHOUT CONTRAST TECHNIQUE: Contiguous axial images were obtained from the base of the skull through the vertex without intravenous contrast. COMPARISON:  CT scan dated 03/03/2015 and MRI dated 03/03/2015 FINDINGS: There is no acute intracranial hemorrhage, acute infarction, or intracranial mass lesion. Small old infarct adjacent to the anterior limb of the right internal capsule. Periventricular white matter lucency most prominent in the posterior parietal regions, stable, consistent with chronic small vessel ischemic disease. Bones are normal. Minimal atrophy. IMPRESSION: No acute abnormality. Chronic small vessel ischemic disease. Tiny old lacunar infarct anterior right basal ganglia. Electronically Signed   By: Lorriane Shire M.D.   On: 04/21/2015 11:06    EKG: Independently reviewed.   Assessment/Plan Present on Admission:  . Acute cerebrovascular accident (CVA) ([place]) . Carotid bruit . HTN (hypertension) . Diastolic dysfunction . Acute cardioembolic stroke [facility])  PLAN:  I suspect she has a small left MCA territory acute CVA.  Given that she has been on therapeutic anticoagulation, will add ASA and PPI to her regimen.  She should be on a statin, and will start her on Lipitor 40mg  per day.  Will continue with her Coumadin per help from pharmacy.  I will perform a full stroke work up, though treatment will likely not altered.  Will obtain ECHO, carotids US (last one several years ago), MRI and MRA of the head.  Will continue with her meds, but hold ACE I.  Her BP will maintain slightly elevated.  WIll give IV NS and correct  hyponatremia (hypovolumic hyponatremia), and hold Lasix.  Neurology will see her tomorrow and make further recommendation.  Spoke with her and her husband with plan of care, they expressed understanding and agreed.  Thank you for allowing me to participate in her care.  Good Day.   Other plans as per orders. Code Status: FULL CODE>    Orvan Falconer, MD. FACP [facility] Pager [phone] 7pm to 7am.  04/21/2015, 12:49 PM

## 2015-04-21 NOTE — ED Notes (Addendum)
Receiving RN not available at this time to take report.  Pt husband left bedside and reported would return to see pt. Pt husband given admission room number.

## 2015-04-21 NOTE — ED Notes (Signed)
EDP at bedside  

## 2015-04-21 NOTE — ED Provider Notes (Signed)
CSN: [id number]     Arrival date & time 04/21/15  [id number] History  By signing my name below, I, Stephania Fragmin, attest that this documentation has been prepared under the direction and in the presence of Noemi Chapel, MD. Electronically Signed: Stephania Fragmin, ED Scribe. 04/21/2015. 10:35 AM.    Chief Complaint  Patient presents with  . Altered Mental Status   The history is provided by the spouse. No language interpreter was used.    HPI Comments: [name] is a 82 y.o. female with a history of atrial fibrillation, warfarin anticoagulation, hypothyroidism, hypertension, hyperlipidemia, and stroke, who presents to the Emergency Department complaining of AMS that began this morning, about 4-5 hours ago. Her husband states she was last normal yesterday and had gone to bed last night at 11 PM without any apparent symptoms. However, he states she woke up this morning at 6 AM, and he had found her in the bathroom sitting with all her clothes off. She had tried pulling her underwear over her head and had urinated in the sink. Patient also did not recognize her husband. Her husband does note spotting some mucus on the bed and states she had been coughing all through last night. Her husband states this has never happened before, although he states she was told that she had a "mild stroke" in the past with no obvious symptoms. Patient is normally able to independently perform her activities of daily living. Patient has had diarrhea, although her husband notes a history of IBS that has been improving. Patient's husband had called her PCP this morning and was recommended to come here. Her husband states she had cholecystectomy about 3 months ago, 01/2015.    Past Medical History  Diagnosis Date  . Atrial fibrillation (HCC)     Paroxysmal, Flecainide therapy  . Warfarin anticoagulation   . Hypothyroidism   . Carotid bruit     Doppler, December, 2009, no abnormality  . Cardiomegaly     EF normal, echo, 2003  //    EF 60-65%, echo, June 18, 123456, diastolic dysfunction,  . Normal nuclear stress test     Normal, 2001  . HLD (hyperlipidemia)   . HTN (hypertension)   . Osteoarthritis   . Calf pain     September, 2012, at rest  . PONV (postoperative nausea and vomiting)   . GERD (gastroesophageal reflux disease)   . Anemia   . Rectal bleeding 2001    diverticulosis and int hemorrhoids on 07/1999 and 02/2010 colonoscopies.  . Hypothyroidism   . Cataract   . Stroke ([place])   . Insomnia   . History of shingles    Past Surgical History  Procedure Laterality Date  . Total knee arthroplasty  05/26/10    right  . Total shoulder replacement  01/2010    left  . Shoulder open rotator cuff repair      bilateral  . Total knee arthroplasty  11/17/2010    Procedure: TOTAL KNEE ARTHROPLASTY;  Surgeon: Mauri Pole;  Location: [facility];  Service: Orthopedics;  Laterality: Left;  . Total knee arthroplasty      Right  . Cataract extraction w/phaco Right 04/15/2014    Procedure: CATARACT EXTRACTION PHACO AND INTRAOCULAR LENS PLACEMENT (IOC);  Surgeon: Tonny Branch, MD;  Location: [facility];  Service: Ophthalmology;  Laterality: Right;  CDE: 13.30  . Cataract extraction w/phaco Left 05/13/2014    Procedure: CATARACT EXTRACTION PHACO AND INTRAOCULAR LENS PLACEMENT LEFT EYE;  Surgeon: Tonny Branch, MD;  Location: [facility];  Service: Ophthalmology;  Laterality: Left;  CDE:13.00  . Esophagogastroduodenoscopy N/A 01/13/2015    Procedure: ESOPHAGOGASTRODUODENOSCOPY (EGD);  Surgeon: Jerene Bears, MD;  Location[facility] END[facility]COPY;  Service: Endoscopy;  Laterality: N/A;  . Cholecystectomy N/A 01/15/2015    Procedure: LAPAROSCOPIC CHOLECYSTECTOMY;  Surgeon: Ralene Ok, MD;  Location: [facility] OR;  Service: General;  Laterality: N/A;   Family History  Problem Relation Age of Onset  . COPD Brother   . Atrial fibrillation Brother   . Diabetes Brother   . Liver cancer Brother     Alcohol-related  . Colon cancer Brother   . Heart attack Father    . Rheum arthritis Mother   . Arthritis Mother   . Heart disease Mother   . Heart attack Daughter   . Coronary artery disease Brother   . Gallbladder disease Brother    Social History  Substance Use Topics  . Smoking status: Never Smoker   . Smokeless tobacco: Never Used  . Alcohol Use: No   OB History    No data available     Review of Systems  Respiratory: Positive for cough.   Gastrointestinal: Positive for diarrhea (although husband notes history of IBS).  Psychiatric/Behavioral: Positive for confusion.  All other systems reviewed and are negative.  Allergies  Penicillins  Home Medications   Prior to Admission medications   Medication Sig Start Date End Date Taking? Authorizing Provider  acetaminophen (TYLENOL) 500 MG tablet Take 500 mg by mouth every 6 (six) hours as needed for moderate pain.   Yes Historical Provider, MD  atenolol (TENORMIN) 25 MG tablet Take 25 mg by mouth daily. Reported on 01/27/2015   Yes Historical Provider, MD  ergocalciferol (VITAMIN D2) 50000 UNITS capsule Take 50,000 Units by mouth every Sunday. Reported on 01/27/2015   Yes Historical Provider, MD  flecainide (TAMBOCOR) 100 MG tablet Take 1 tablet (100 mg total) by mouth 2 (two) times daily. 01/28/15  Yes Timmothy Euler, MD  furosemide (LASIX) 40 MG tablet Take 0.5-1 tablets (20-40 mg total) by mouth daily. Take 1/2 tablet (20 mg) PO every morning, take 1 tablet (40 mg) PO if swelling occurs. Patient taking differently: Take 40 mg by mouth daily.  04/11/15  Yes Timmothy Euler, MD  GARLIC PO Take 1 tablet by mouth daily after supper.    Yes Historical Provider, MD  levothyroxine (SYNTHROID, LEVOTHROID) 50 MCG tablet Take 50 mcg by mouth daily before breakfast.  01/20/15  Yes Historical Provider, MD  lisinopril (PRINIVIL,ZESTRIL) 10 MG tablet Take 1 tablet (10 mg total) by mouth daily. Patient taking differently: Take 10 mg by mouth every evening.  02/19/15  Yes Timmothy Euler, MD  mupirocin  ointment (BACTROBAN) 2 % APPLY INTO NOSTRIL(S) 2 TIMES A DAY AS DIRECTED 03/18/15  Yes Timmothy Euler, MD  oxybutynin (DITROPAN) 5 MG tablet Take 1 tablet (5 mg total) by mouth 3 (three) times daily. 04/14/15  Yes Timmothy Euler, MD  potassium chloride SA (K-DUR,KLOR-CON) 20 MEQ tablet Take 20 mEq by mouth every morning. Take with food 10/19/12  Yes Historical Provider, MD  vitamin B-12 (CYANOCOBALAMIN) 500 MCG tablet Take 500 mcg by mouth every Monday, Wednesday, and Friday.    Yes Historical Provider, MD  warfarin (COUMADIN) 5 MG tablet Take 5 mg by mouth every morning.    Yes Historical Provider, MD  zolpidem (AMBIEN) 10 MG tablet TAKE 1/2 TO 1 TABLET AT BEDTIME AS NEEDED FOR INSOMNIA 03/18/15  Yes Mikeal Hawthorne  Carmin Muskrat, MD   BP 174/66 mmHg  Pulse 65  Temp(Src) 98.2 F (36.8 C)  Resp 16  Ht 5\' 1"  (1.549 m)  Wt 139 lb (63.05 kg)  BMI 26.28 kg/m2  SpO2 97% Physical Exam  Constitutional: She appears well-developed and well-nourished. No distress.  Mild disorientation.  No facial droop.   HENT:  Head: Normocephalic and atraumatic.  Mouth/Throat: Oropharynx is clear and moist. No oropharyngeal exudate.  Eyes: Conjunctivae and EOM are normal. Pupils are equal, round, and reactive to light. Right eye exhibits no discharge. Left eye exhibits no discharge. No scleral icterus.  Neck: Normal range of motion. Neck supple. No JVD present. No thyromegaly present.  Cardiovascular: Normal rate, regular rhythm, normal heart sounds and intact distal pulses.  Exam reveals no gallop and no friction rub.   No murmur heard. Strong pulses at radial arteries and at [facility].   Pulmonary/Chest: Effort normal and breath sounds normal. No respiratory distress. She has no wheezes. She has no rales.  Abdominal: Soft. Bowel sounds are normal. She exhibits no distension and no mass. There is tenderness.  Mild suprapubic tenderness. Very soft.  Musculoskeletal: Normal range of motion. She exhibits no edema or tenderness.   Bilateral knee replacement scars.  Lymphadenopathy:    She has no cervical adenopathy.  Neurological: She is alert. No cranial nerve deficit. Coordination normal.  Mild expressive aphasia, intermittently, but otherwise follows commands without difficulty, including FNF. Strength in all 4 extremities and CN III-XII intact.  Skin: Skin is warm and dry. No rash noted. No erythema.  No rashes to the skin.  Psychiatric: She has a normal mood and affect. Her behavior is normal.  Nursing note and vitals reviewed.   ED Course  Procedures (including critical care time)  DIAGNOSTIC STUDIES: Oxygen Saturation is 98% on RA, normal by my interpretation.    COORDINATION OF CARE: 10:24 AM - Discussed treatment plan with pt's husband at bedside which includes blood lab tests and UA. Pt's husband verbalized understanding and agreed to plan.   Labs Review Labs Reviewed  PROTIME-INR - Abnormal; Notable for the following:    Prothrombin Time 22.6 (*)    INR 2.01 (*)    All other components within normal limits  DIFFERENTIAL - Abnormal; Notable for the following:    Neutro Abs 8.1 (*)    All other components within normal limits  COMPREHENSIVE METABOLIC PANEL - Abnormal; Notable for the following:    Sodium 129 (*)    Chloride 89 (*)    Glucose, Bld 125 (*)    Calcium 8.6 (*)    ALT 12 (*)    All other components within normal limits  URINALYSIS, ROUTINE W REFLEX MICROSCOPIC (NOT AT [facility]) - Abnormal; Notable for the following:    Specific Gravity, Urine <1.005 (*)    Hgb urine dipstick SMALL (*)    All other components within normal limits  CBG MONITORING, ED - Abnormal; Notable for the following:    Glucose-Capillary 114 (*)    All other components within normal limits  I-STAT CHEM 8, ED - Abnormal; Notable for the following:    Sodium 128 (*)    Chloride 86 (*)    Glucose, Bld 120 (*)    Calcium, Ion 1.06 (*)    Hemoglobin 15.3 (*)    All other components within normal limits  APTT   CBC  URINE MICROSCOPIC-ADD ON  I-STAT TROPOININ, ED    Imaging Review Ct Head Wo Contrast  04/21/2015  CLINICAL DATA:  Acute onset of altered mental status at 6 a.m. today. EXAM: CT HEAD WITHOUT CONTRAST TECHNIQUE: Contiguous axial images were obtained from the base of the skull through the vertex without intravenous contrast. COMPARISON:  CT scan dated 03/03/2015 and MRI dated 03/03/2015 FINDINGS: There is no acute intracranial hemorrhage, acute infarction, or intracranial mass lesion. Small old infarct adjacent to the anterior limb of the right internal capsule. Periventricular white matter lucency most prominent in the posterior parietal regions, stable, consistent with chronic small vessel ischemic disease. Bones are normal. Minimal atrophy. IMPRESSION: No acute abnormality. Chronic small vessel ischemic disease. Tiny old lacunar infarct anterior right basal ganglia. Electronically Signed   By: Lorriane Shire M.D.   On: 04/21/2015 11:06   I have personally reviewed and evaluated these images and lab results as part of my medical decision-making.   EKG Interpretation   Date/Time:  Monday April 21 2015 10:14:57 EDT Ventricular Rate:  68 PR Interval:  290 QRS Duration: 132 QT Interval:  443 QTC Calculation: 471 R Axis:   34 Text Interpretation:  Sinus or ectopic atrial rhythm Prolonged PR interval  Nonspecific intraventricular conduction delay Probable anterior infarct,  age indeterminate Since last tracing ectopy no longer present Confirmed by  Janei Scheff  MD, Earma Nicolaou ([id number]) on 04/21/2015 11:17:05 AM      MDM   Final diagnoses:  Aphasia  Essential hypertension   I personally performed the services described in this documentation, which was scribed in my presence. The recorded information has been reviewed and is accurate.    The pt is persistently altered but gradually improving Focal defecit on exam is just the sopeech VS with hypertension but no other findings Labs and CT  reviewed - no acute findings to explain Possible stroke D/w Dr. Marin Comment who will admit  Noemi Chapel, MD 04/21/15 1148

## 2015-04-21 NOTE — ED Notes (Signed)
Attempted Report x1. Receiving Unit reported would call back for report when nurse available.

## 2015-04-21 NOTE — Progress Notes (Signed)
ANTICOAGULATION CONSULT NOTE - Initial Consult  Pharmacy Consult for Coumadin (chronic Rx PTA) Indication: atrial fibrillation  Allergies  Allergen Reactions  . Penicillins Rash    Has patient had a PCN reaction causing immediate rash, facial/tongue/throat swelling, SOB or lightheadedness with hypotension: Yes Has patient had a PCN reaction causing severe rash involving mucus membranes or skin necrosis: No Has patient had a PCN reaction that required hospitalization No Has patient had a PCN reaction occurring within the last 10 years: No If all of the above answers are "NO", then may proceed with Cephalosporin use.   Patient Measurements: Height: 5\' 1"  (154.9 cm) Weight: 139 lb (63.05 kg) IBW/kg (Calculated) : 47.8  Vital Signs: Temp: 98.3 F (36.8 C) (04/17 1453) Temp Source: Oral (04/17 1453) BP: 157/61 mmHg (04/17 1430) Pulse Rate: 63 (04/17 1430)  Labs:  Recent Labs  04/21/15 1026 04/21/15 1027  HGB 13.6 15.3*  HCT 39.9 45.0  PLT 312  --   APTT 37  --   LABPROT 22.6*  --   INR 2.01*  --   CREATININE 0.78 0.80   Estimated Creatinine Clearance: 46.9 mL/min (by C-G formula based on Cr of 0.8).  Medical History: Past Medical History  Diagnosis Date  . Atrial fibrillation (HCC)     Paroxysmal, Flecainide therapy  . Warfarin anticoagulation   . Hypothyroidism   . Carotid bruit     Doppler, December, 2009, no abnormality  . Cardiomegaly     EF normal, echo, 2003  //   EF 60-65%, echo, June 18, 123456, diastolic dysfunction,  . Normal nuclear stress test     Normal, 2001  . HLD (hyperlipidemia)   . HTN (hypertension)   . Osteoarthritis   . Calf pain     September, 2012, at rest  . PONV (postoperative nausea and vomiting)   . GERD (gastroesophageal reflux disease)   . Anemia   . Rectal bleeding 2001    diverticulosis and int hemorrhoids on 07/1999 and 02/2010 colonoscopies.  . Hypothyroidism   . Cataract   . Stroke ([place])   . Insomnia   . History of shingles     Medications:   (Not in a hospital admission)  Assessment: 82yo female on chronic Coumadin for h/o afib.  INR is therapeutic on admission.  Per PTA med list, pt took last dose this morning at 0700.  Goal of Therapy:  INR 2-3 Monitor platelets by anticoagulation protocol: Yes   Plan:   No additional Coumadin today  INR daily  Hart Robinsons A 04/21/2015,3:10 PM

## 2015-04-21 NOTE — ED Notes (Signed)
Went to bed at 2100 last night and woke up this am about 0600, having difficulty with dressing and speech.

## 2015-04-22 ENCOUNTER — Observation Stay (HOSPITAL_COMMUNITY)
Admit: 2015-04-22 | Discharge: 2015-04-22 | Disposition: A | Discharge status: Home / Self Care | Payer: Medicare Other | Attending: Neurology | Admitting: Neurology

## 2015-04-22 ENCOUNTER — Telehealth: Payer: Self-pay

## 2015-04-22 DIAGNOSIS — I6990 Unspecified sequelae of unspecified cerebrovascular disease: Secondary | ICD-10-CM | POA: Diagnosis not present

## 2015-04-22 DIAGNOSIS — I519 Heart disease, unspecified: Secondary | ICD-10-CM | POA: Diagnosis not present

## 2015-04-22 DIAGNOSIS — I10 Essential (primary) hypertension: Secondary | ICD-10-CM | POA: Diagnosis not present

## 2015-04-22 DIAGNOSIS — I639 Cerebral infarction, unspecified: Secondary | ICD-10-CM | POA: Diagnosis not present

## 2015-04-22 DIAGNOSIS — E785 Hyperlipidemia, unspecified: Secondary | ICD-10-CM | POA: Diagnosis not present

## 2015-04-22 DIAGNOSIS — I4891 Unspecified atrial fibrillation: Secondary | ICD-10-CM | POA: Diagnosis not present

## 2015-04-22 LAB — LIPID PANEL
CHOL/HDL RATIO: 2.3 ratio
Cholesterol: 204 mg/dL — ABNORMAL HIGH (ref 0–200)
HDL: 88 mg/dL (ref >40)
LDL CALC: 107 mg/dL — AB (ref 0–99)
Triglycerides: 43 mg/dL (ref <150)
VLDL: 9 mg/dL (ref 0–40)

## 2015-04-22 LAB — TSH: TSH: 2.82 u[IU]/mL (ref 0.350–4.500)

## 2015-04-22 LAB — PROTIME-INR
INR: 2.84 — AB (ref 0.00–1.49)
PROTHROMBIN TIME: 29.4 s — AB (ref 11.6–15.2)

## 2015-04-22 MED ORDER — WARFARIN SODIUM 5 MG PO TABS: 5.0000 mg | ORAL_TABLET | Freq: Once | ORAL | Status: DC

## 2015-04-22 MED ORDER — PANTOPRAZOLE SODIUM 40 MG PO TBEC: 40.0000 mg | DELAYED_RELEASE_TABLET | Freq: Every day | ORAL | Status: DC

## 2015-04-22 MED ORDER — PANTOPRAZOLE SODIUM 40 MG PO TBEC
40.0000 mg | DELAYED_RELEASE_TABLET | Freq: Every day | ORAL | Status: DC
  Administered 2015-04-22: 40 mg via ORAL
  Filled 2015-04-22: qty 1

## 2015-04-22 MED ORDER — ATORVASTATIN CALCIUM 40 MG PO TABS: 40.0000 mg | ORAL_TABLET | Freq: Every day | ORAL | Status: DC

## 2015-04-22 NOTE — Evaluation (Signed)
Physical Therapy Evaluation Patient Details Name: [name] MRN: [id number] DOB: [birth year] Today's Date: 04/22/2015   History of Present Illness  [name] is an right handed 82 y.o. 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 Her ictus was over 12 hours ago. She did not have upper or lower extremity weakness, or facial droop. In the ER, her symptoms improved, with no confusion and was having mininal expressive aphasia. Work up included a head CT which showed no acute process, old lacunar infarct, and EKG showed no afib. Her serology was unremarkable except her Na was low at 128. Hospitalist was asked to admit her for stroke work up.   Clinical Impression  Pt received sitting up in the chair, husband present, and was agreeable to PT evaluation.  Pt demonstrated all transfers at Mod I level, however, she c/o feeling unsteady during gait assessment, as well as demonstrated staggered, and veering from path during gait.  BP was 140/59, HR: 58bpm, and SpO2: 98% on RA.  No overt LOB during gait, with pt correcting via stepping strategy. Pt was able to negotiate steps with supervision.  Pt is recommended for OPPT due to noted balance deficits.      Follow Up Recommendations Outpatient PT;Other (comment) (for balance deficits)    Equipment Recommendations  None recommended by PT    Recommendations for Other Services       Precautions / Restrictions Precautions Precautions: None Restrictions Weight Bearing Restrictions: No      Mobility  Bed Mobility Overal bed mobility: Modified Independent             General bed mobility comments: Not assessed, pt sitting up in the chair upon arrival, and left sitting up in the chair, however OT documented that pt was able to complete bed level mobility at Mod (I) level.   Transfers Overall transfer level: Modified independent                  Ambulation/Gait Ambulation/Gait assistance: Supervision Ambulation Distance (Feet): 200 Feet Assistive device: None Gait Pattern/deviations: Drifts right/left     General Gait Details: pt demonstrates staggared gait, and drifts both directions.  Pt states she occasionally does this at home, but this is much worse than normal.  At home she states that she normally just bumps off the wall when it happens.    Stairs Stairs: Yes Stairs assistance: Supervision Stair Management: One rail Right;Two rails Number of Stairs: 3 General stair comments: Pt demonstrated step to pattern for stair negotiation.  She held R rail on the way up, and B rails on the way down.   Wheelchair Mobility    Modified Rankin (Stroke Patients Only)       Balance Overall balance assessment: Needs assistance         Standing balance support: No upper extremity supported Standing balance-Leahy Scale: Poor               High level balance activites:  (unable to complete due to staggered gait)               Pertinent Vitals/Pain Pain Assessment: No/denies  pain    Home Living Family/patient expects to be discharged to:: Private residence Living Arrangements: Spouse/significant other Available Help at Discharge: Family;Available 24 hours/day Type of Home: House Home Access: Stairs to enter Entrance Stairs-Rails: Can reach both Entrance Stairs-Number of Steps: 1 step up onto the porch, then 1 step into the house Home Layout: Laundry or work area in basement;Two level;Able to live on main level with bedroom/bathroom Home Equipment: Gilford Rile - 2 wheels;Bedside commode;Tub bench;Grab bars -  tub/shower;Adaptive equipment Additional Comments: Pt reports she has DME available however does not use SPC or walker.     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       Hand Dominance   Dominant Hand: Right    Extremity/Trunk Assessment   Upper Extremity Assessment: Defer to OT evaluation           Lower Extremity Assessment: Overall WFL for tasks assessed         Communication   Communication: No difficulties  Cognition Arousal/Alertness: Awake/alert Behavior During Therapy: WFL for tasks assessed/performed Overall Cognitive Status: Within Functional Limits for tasks assessed                      General Comments      Exercises        Assessment/Plan    PT Assessment Patient needs continued PT services  PT Diagnosis Abnormality of gait   PT Problem List Decreased balance  PT Treatment Interventions Gait training;DME instruction;Stair training;Functional mobility training;Therapeutic activities;Balance training;Therapeutic exercise;Neuromuscular re-education;Patient/family education   PT Goals (Current goals can be found in the Care Plan section) Acute Rehab PT Goals Patient Stated Goal: Pt wants to go home PT Goal Formulation: With patient/family Time For Goal Achievement: 04/29/15 Potential to Achieve Goals: Good    Frequency Min 1X/week   Barriers to discharge        Co-evaluation               End of Session Equipment Utilized During Treatment: Gait belt Activity Tolerance: Patient tolerated treatment well        Functional Assessment Tool Used: Clinical Judgement Functional Limitation: Mobility: Walking and moving around Mobility: Walking and Moving Around Current Status [id number]): At least 20 percent but less than 40 percent impaired,  limited or restricted Mobility: Walking and Moving Around Goal Status [phone]): At least 1 percent but less than 20 percent impaired, limited or restricted    Time: 1003-1019 PT Time Calculation (min) (ACUTE ONLY): 16 min   Charges:   PT Evaluation $PT Eval Low Complexity: 1 Procedure     PT G Codes:   PT G-Codes **NOT FOR INPATIENT CLASS** Functional Assessment Tool Used: Clinical Judgement Functional Limitation: Mobility: Walking and moving around Mobility: Walking and Moving Around Current Status [id number]): At least 20 percent but less than 40 percent impaired, limited or restricted Mobility: Walking and Moving Around Goal Status [phone]): At least 1 percent but less than 20 percent impaired, limited or restricted    Tacy Learn, PT, DPT X: [id number]   04/22/2015, 12:24 PM

## 2015-04-22 NOTE — Progress Notes (Signed)
Discharge instructions and prescriptions given, verbalized understanding, out in stable condition via w/c with staff. 

## 2015-04-22 NOTE — Consult Note (Signed)
Terri A. Merlene Laughter, MD     [email or web address]          [name] is an 82 y.o. female.   ASSESSMENT/PLAN: Acute confusional spell of unclear etiology. The semiology and imaging argues against intracranial processes and infarcts. The patient could be having complex partial seizures however. No metabolic disturbance uncovered to explain the spell.  Chronic atrial fibrillation on chronic warfarin therapy.  RECOMMENDATION: Continue with warfarin. No need to add aspirin as the patient has not had a clear infarct. EEG. Additional dementia labs.  Patient 82 year old white female who had acute onset of confusion and disorientation. The patient is amnestic to the event. The event lasted for about 2 hours. The husband reports that she had a spelled similarly about several months ago. She was told that she may have had a mini stroke or a warning stroke but imaging was unrevealing. The patient has been worked up during this hospitalization imaging has been mostly unrevealing. She is on warfarin therapy for chronic atrial fibrillation and has been compliant with this. INR is repeated. The patient does not report having focal numbness, weakness, chest pain or shortness of breath. She reports having left upper extremity pain the day before she had this event. She has had some headaches and some lightheadedness on yesterday. She has had some chronic frontal headaches recently. Review systems otherwise negative.  GENERAL: Pleasant thin female in no acute distress.  HEENT: Normal.  ABDOMEN: soft  EXTREMITIES: No edema   BACK: Normal.  SKIN: Normal by inspection.    MENTAL STATUS: Alert and oriented. Speech, language and cognition are generally intact. Judgment and insight normal.   CRANIAL NERVES: Pupils are equal, round and reactive to light and accomodation; extra ocular movements are full, there is no significant nystagmus; visual fields are full; upper and lower  facial muscles are normal in strength and symmetric, there is no flattening of the nasolabial folds; tongue is midline; uvula is midline; shoulder elevation is normal.  MOTOR: She does have some proximal muscle weakness involving deltoids bilaterally 4/5. Proximal strength of the legs are normal. Distal strength are normal bilaterally.  COORDINATION: Left finger to nose is normal, right finger to nose is normal, No rest tremor; no intention tremor; no postural tremor; no bradykinesia.  REFLEXES: Deep tendon reflexes are symmetrical and normal. Babinski reflexes are flexor bilaterally.   SENSATION: Normal to light touch.      Blood pressure 135/52, pulse 60, temperature 98.4 F (36.9 C), temperature source Oral, resp. rate 20, height 5' 3" (1.6 m), weight 132 lb 6.4 oz (60.056 kg), SpO2 94 %.  Past Medical History  Diagnosis Date  . Atrial fibrillation (HCC)     Paroxysmal, Flecainide therapy  . Warfarin anticoagulation   . Hypothyroidism   . Carotid bruit     Doppler, December, 2009, no abnormality  . Cardiomegaly     EF normal, echo, 2003  //   EF 60-65%, echo, June 21, 2008, diastolic dysfunction,  . Normal nuclear stress test     Normal, 2001  . HLD (hyperlipidemia)   . HTN (hypertension)   . Osteoarthritis   . Calf pain     September, 2012, at rest  . PONV (postoperative nausea and vomiting)   . GERD (gastroesophageal reflux disease)   . Anemia   . Rectal bleeding 2001    diverticulosis and int hemorrhoids on 07/1999 and 02/2010 colonoscopies.  . Hypothyroidism   . Cataract   . Stroke ([place])   .  Insomnia   . History of shingles     Past Surgical History  Procedure Laterality Date  . Total knee arthroplasty  05/26/10    right  . Total shoulder replacement  01/2010    left  . Shoulder open rotator cuff repair      bilateral  . Total knee arthroplasty  11/17/2010    Procedure: TOTAL KNEE ARTHROPLASTY;  Surgeon: Mauri Pole;  Location: [facility];  Service:  Orthopedics;  Laterality: Left;  . Total knee arthroplasty      Right  . Cataract extraction w/phaco Right 04/15/2014    Procedure: CATARACT EXTRACTION PHACO AND INTRAOCULAR LENS PLACEMENT (IOC);  Surgeon: Tonny Branch, MD;  Location: [facility];  Service: Ophthalmology;  Laterality: Right;  CDE: 13.30  . Cataract extraction w/phaco Left 05/13/2014    Procedure: CATARACT EXTRACTION PHACO AND INTRAOCULAR LENS PLACEMENT LEFT EYE;  Surgeon: Tonny Branch, MD;  Location: [facility];  Service: Ophthalmology;  Laterality: Left;  CDE:13.00  . Esophagogastroduodenoscopy N/A 01/13/2015    Procedure: ESOPHAGOGASTRODUODENOSCOPY (EGD);  Surgeon: Jerene Bears, MD;  [facility] ENDOSCOPY;  Service: Endoscopy;  Laterality: N/A;  . Cholecystectomy N/A 01/15/2015    Procedure: LAPAROSCOPIC CHOLECYSTECTOMY;  Surgeon: Ralene Ok, MD;  Location: [facility] OR;  Service: General;  Laterality: N/A;    Family History  Problem Relation Age of Onset  . COPD Brother   . Atrial fibrillation Brother   . Diabetes Brother   . Liver cancer Brother     Alcohol-related  . Colon cancer Brother   . Heart attack Father   . Rheum arthritis Mother   . Arthritis Mother   . Heart disease Mother   . Heart attack Daughter   . Coronary artery disease Brother   . Gallbladder disease Brother     Social History:  reports that she has never smoked. She has never used smokeless tobacco. She reports that she does not drink alcohol or use illicit drugs.  Allergies:  Allergies  Allergen Reactions  . Penicillins Rash    Has patient had a PCN reaction causing immediate rash, facial/tongue/throat swelling, SOB or lightheadedness with hypotension: Yes Has patient had a PCN reaction causing severe rash involving mucus membranes or skin necrosis: No Has patient had a PCN reaction that required hospitalization No Has patient had a PCN reaction occurring within the last 10 years: No If all of the above answers are "NO", then may proceed with Cephalosporin  use.    Medications: Prior to Admission medications   Medication Sig Start Date End Date Taking? Authorizing Provider  acetaminophen (TYLENOL) 500 MG tablet Take 500 mg by mouth every 6 (six) hours as needed for moderate pain.   Yes Historical Provider, MD  atenolol (TENORMIN) 25 MG tablet Take 25 mg by mouth daily. Reported on 01/27/2015   Yes Historical Provider, MD  ergocalciferol (VITAMIN D2) 50000 UNITS capsule Take 50,000 Units by mouth every Sunday. Reported on 01/27/2015   Yes Historical Provider, MD  flecainide (TAMBOCOR) 100 MG tablet Take 1 tablet (100 mg total) by mouth 2 (two) times daily. 01/28/15  Yes Timmothy Euler, MD  furosemide (LASIX) 40 MG tablet Take 0.5-1 tablets (20-40 mg total) by mouth daily. Take 1/2 tablet (20 mg) PO every morning, take 1 tablet (40 mg) PO if swelling occurs. Patient taking differently: Take 40 mg by mouth daily.  04/11/15  Yes Timmothy Euler, MD  GARLIC PO Take 1 tablet by mouth daily after supper.    Yes Historical  Provider, MD  levothyroxine (SYNTHROID, LEVOTHROID) 50 MCG tablet Take 50 mcg by mouth daily before breakfast.  01/20/15  Yes Historical Provider, MD  lisinopril (PRINIVIL,ZESTRIL) 10 MG tablet Take 1 tablet (10 mg total) by mouth daily. Patient taking differently: Take 10 mg by mouth every evening.  02/19/15  Yes Timmothy Euler, MD  mupirocin ointment (BACTROBAN) 2 % APPLY INTO NOSTRIL(S) 2 TIMES A DAY AS DIRECTED 03/18/15  Yes Timmothy Euler, MD  oxybutynin (DITROPAN) 5 MG tablet Take 1 tablet (5 mg total) by mouth 3 (three) times daily. 04/14/15  Yes Timmothy Euler, MD  potassium chloride SA (K-DUR,KLOR-CON) 20 MEQ tablet Take 20 mEq by mouth every morning. Take with food 10/19/12  Yes Historical Provider, MD  vitamin B-12 (CYANOCOBALAMIN) 500 MCG tablet Take 500 mcg by mouth every Monday, Wednesday, and Friday.    Yes Historical Provider, MD  warfarin (COUMADIN) 5 MG tablet Take 5 mg by mouth every morning.    Yes Historical  Provider, MD  zolpidem (AMBIEN) 10 MG tablet TAKE 1/2 TO 1 TABLET AT BEDTIME AS NEEDED FOR INSOMNIA 03/18/15  Yes Timmothy Euler, MD    Scheduled Meds: . aspirin  300 mg Rectal Daily   Or  . aspirin  325 mg Oral Daily  . atenolol  25 mg Oral Daily  . atorvastatin  40 mg Oral q1800  . feeding supplement (ENSURE ENLIVE)  237 mL Oral BID BM  . flecainide  100 mg Oral BID  . levothyroxine  50 mcg Oral QAC breakfast  . oxybutynin  5 mg Oral TID  . pantoprazole  40 mg Oral Q0600  . sodium chloride flush  3 mL Intravenous Q12H  . [START ON 04/27/2015] Vitamin D (Ergocalciferol)  50,000 Units Oral Q Sun  . Warfarin - Pharmacist Dosing Inpatient   Does not apply Q24H   Continuous Infusions:  PRN Meds:.sodium chloride, acetaminophen, senna-docusate, sodium chloride flush, zolpidem     Results for orders placed or performed during the hospital encounter of 04/21/15 (from the past 48 hour(s))  Protime-INR     Status: Abnormal   Collection Time: 04/21/15 10:26 AM  Result Value Ref Range   Prothrombin Time 22.6 (H) 11.6 - 15.2 seconds   INR 2.01 (H) 0.00 - 1.49  APTT     Status: None   Collection Time: 04/21/15 10:26 AM  Result Value Ref Range   aPTT 37 24 - 37 seconds    Comment:        IF BASELINE aPTT IS ELEVATED, SUGGEST PATIENT RISK ASSESSMENT BE USED TO DETERMINE APPROPRIATE ANTICOAGULANT THERAPY.   CBC     Status: None   Collection Time: 04/21/15 10:26 AM  Result Value Ref Range   WBC 9.9 4.0 - 10.5 K/uL   RBC 4.35 3.87 - 5.11 MIL/uL   Hemoglobin 13.6 12.0 - 15.0 g/dL   HCT 39.9 36.0 - 46.0 %   MCV 91.7 78.0 - 100.0 fL   MCH 31.3 26.0 - 34.0 pg   MCHC 34.1 30.0 - 36.0 g/dL   RDW 14.2 11.5 - 15.5 %   Platelets 312 150 - 400 K/uL  Differential     Status: Abnormal   Collection Time: 04/21/15 10:26 AM  Result Value Ref Range   Neutrophils Relative % 82 %   Neutro Abs 8.1 (H) 1.7 - 7.7 K/uL   Lymphocytes Relative 13 %   Lymphs Abs 1.3 0.7 - 4.0 K/uL   Monocytes  Relative 5 %  Monocytes Absolute 0.5 0.1 - 1.0 K/uL   Eosinophils Relative 0 %   Eosinophils Absolute 0.0 0.0 - 0.7 K/uL   Basophils Relative 0 %   Basophils Absolute 0.0 0.0 - 0.1 K/uL  Comprehensive metabolic panel     Status: Abnormal   Collection Time: 04/21/15 10:26 AM  Result Value Ref Range   Sodium 129 (L) 135 - 145 mmol/L   Potassium 3.5 3.5 - 5.1 mmol/L   Chloride 89 (L) 101 - 111 mmol/L   CO2 29 22 - 32 mmol/L   Glucose, Bld 125 (H) 65 - 99 mg/dL   BUN 10 6 - 20 mg/dL   Creatinine, Ser 0.78 0.44 - 1.00 mg/dL   Calcium 8.6 (L) 8.9 - 10.3 mg/dL   Total Protein 7.2 6.5 - 8.1 g/dL   Albumin 4.0 3.5 - 5.0 g/dL   AST 22 15 - 41 U/L   ALT 12 (L) 14 - 54 U/L   Alkaline Phosphatase 46 38 - 126 U/L   Total Bilirubin 0.5 0.3 - 1.2 mg/dL   GFR calc non Af Amer >60 >60 mL/min   GFR calc Af Amer >60 >60 mL/min    Comment: (NOTE) The eGFR has been calculated using the CKD EPI equation. This calculation has not been validated in all clinical situations. eGFR's persistently <60 mL/min signify possible Chronic Kidney Disease.    Anion gap 11 5 - 15  I-stat troponin, ED (not at [facility])     Status: None   Collection Time: 04/21/15 10:27 AM  Result Value Ref Range   Troponin i, poc 0.02 0.00 - 0.08 ng/mL   Comment 3            Comment: Due to the release kinetics of cTnI, a negative result within the first hours of the onset of symptoms does not rule out myocardial infarction with certainty. If myocardial infarction is still suspected, repeat the test at appropriate intervals.   I-Stat Chem 8, ED  (not at [facility], [facility])     Status: Abnormal   Collection Time: 04/21/15 10:27 AM  Result Value Ref Range   Sodium 128 (L) 135 - 145 mmol/L   Potassium 3.5 3.5 - 5.1 mmol/L   Chloride 86 (L) 101 - 111 mmol/L   BUN 10 6 - 20 mg/dL   Creatinine, Ser 0.80 0.44 - 1.00 mg/dL   Glucose, Bld 120 (H) 65 - 99 mg/dL   Calcium, Ion 1.06 (L) 1.13 - 1.30 mmol/L   TCO2 30 0 - 100 mmol/L    Hemoglobin 15.3 (H) 12.0 - 15.0 g/dL   HCT 45.0 36.0 - 46.0 %  Urinalysis, Routine w reflex microscopic (not at [facility])     Status: Abnormal   Collection Time: 04/21/15 10:31 AM  Result Value Ref Range   Color, Urine YELLOW YELLOW   APPearance CLEAR CLEAR   Specific Gravity, Urine <1.005 (L) 1.005 - 1.030   pH 6.5 5.0 - 8.0   Glucose, UA NEGATIVE NEGATIVE mg/dL   Hgb urine dipstick SMALL (A) NEGATIVE   Bilirubin Urine NEGATIVE NEGATIVE   Ketones, ur NEGATIVE NEGATIVE mg/dL   Protein, ur NEGATIVE NEGATIVE mg/dL   Nitrite NEGATIVE NEGATIVE   Leukocytes, UA NEGATIVE NEGATIVE  Urine microscopic-add on     Status: None   Collection Time: 04/21/15 10:31 AM  Result Value Ref Range   Squamous Epithelial / LPF NONE SEEN NONE SEEN   WBC, UA 0-5 0 - 5 WBC/hpf   RBC / HPF NONE  SEEN 0 - 5 RBC/hpf   Bacteria, UA NONE SEEN NONE SEEN  CBG monitoring, ED     Status: Abnormal   Collection Time: 04/21/15 10:34 AM  Result Value Ref Range   Glucose-Capillary 114 (H) 65 - 99 mg/dL   Comment 1 Notify RN    Comment 2 Document in Chart   Lipid panel     Status: Abnormal   Collection Time: 04/22/15  5:50 AM  Result Value Ref Range   Cholesterol 204 (H) 0 - 200 mg/dL   Triglycerides 43 <150 mg/dL   HDL 88 >40 mg/dL   Total CHOL/HDL Ratio 2.3 RATIO   VLDL 9 0 - 40 mg/dL   LDL Cholesterol 107 (H) 0 - 99 mg/dL    Comment:        Total Cholesterol/HDL:CHD Risk Coronary Heart Disease Risk Table                     Men   Women  1/2 Average Risk   3.4   3.3  Average Risk       5.0   4.4  2 X Average Risk   9.6   7.1  3 X Average Risk  23.4   11.0        Use the calculated Patient Ratio above and the CHD Risk Table to determine the patient's CHD Risk.        ATP III CLASSIFICATION (LDL):  <100     mg/dL   Optimal  100-129  mg/dL   Near or Above                    Optimal  130-159  mg/dL   Borderline  160-189  mg/dL   High  >190     mg/dL   Very High   Protime-INR     Status: Abnormal    Collection Time: 04/22/15  5:50 AM  Result Value Ref Range   Prothrombin Time 29.4 (H) 11.6 - 15.2 seconds   INR 2.84 (H) 0.00 - 1.49    Studies/Results:  BRAIN MRI/MRA MRI HEAD FINDINGS  Major intracranial vascular flow voids appear stable. No restricted diffusion to suggest acute infarction. No midline shift, mass effect, evidence of mass lesion, ventriculomegaly, extra-axial collection or acute intracranial hemorrhage. Cervicomedullary junction and pituitary are within normal limits.  Stable gray and white matter signal throughout the brain, patchy and confluent nonspecific white matter T2 and FLAIR hyperintensity. No cortical encephalomalacia or chronic cerebral blood products identified.  Visible internal auditory structures appear normal. Mastoids are clear. Paranasal sinuses, orbits, and scalp soft tissues appear grossly stable. Negative visualized cervical spine.  MRA HEAD FINDINGS  Stable antegrade flow in the posterior circulation with fairly codominant distal vertebral arteries. Normal PICA origins, vertebrobasilar junction, basilar artery, SCA, and PCA origins. Posterior communicating arteries are diminutive or absent. Bilateral PCA branches appear stable and within normal limits.  Stable antegrade flow in both ICA siphons. Patent carotid termini. No siphon stenosis identified. MCA and ACA origins remain normal. Anterior communicating artery and visualized bilateral ACA branches appear stable and within normal limits. Visualized bilateral MCA branches appear stable and within normal limits.  IMPRESSION: 1. No acute intracranial abnormality. 2. Stable noncontrast MRI appearance of the brain since February. 3. Stable and negative for age intracranial MRA.    A. [name], M.D.  Diplomate, Tax adviser of Psychiatry and Neurology ( Neurology). 04/22/2015, 7:27 AM

## 2015-04-22 NOTE — Discharge Summary (Signed)
Physician Discharge Summary  [name] [id number] DOB: 19-Feb-1932 DOA: 04/21/2015  PCP: Kenn File, MD  Admit date: 04/21/2015 Discharge date: 04/22/2015  Time spent: 35 minutes  Recommendations for Outpatient Follow-up:  1. Follow up with PCP in one week. 2. Follow up with neurology as soon as appointment is available.    Discharge Diagnoses:  Active Problems:   Warfarin anticoagulation   Carotid bruit   Diastolic dysfunction   HTN (hypertension)   Acute cerebrovascular accident (CVA) ([place])   Acute cardioembolic stroke [facility])   Discharge Condition: improved, with no confusion, speech is fluent.   Diet recommendation: heart healthy.  Filed Weights   04/21/15 1005 04/21/15 1515  Weight: 63.05 kg (139 lb) 60.056 kg (132 lb 6.4 oz)    History of present illness: Patient was admitted for a stroke work up by me on April 21, 2015.  As per my H and P:  " 82 yo female with h/o gastritis, colitis with several hospitalizations at [facility] recently moved to [place] comes in with several days of nausea and vomiting with epigastric pain. Pt is suppose to be on nexium for life and ran out of it over 3 weeks ago b/c her pharmacy no longer carries it at a reasonable cost. Pt also has been on carafate in the past and this helped her a lot too, but she was only given this for a month. Pt denies any diarrhea. No fevers. Upon entering the room pt was resting comfortably asleep. i had to awaken her to obtain history and physical. Pt complaining of severe pain. Has received 3 mg of dilaudid, zofran, compazine, ativan, carafate, protonix in the ED and reports none of it has helped. Pt referred for admission for her abdominal pain and intractable n/v, although it has been several hours since any vomiting.   Hospital Course: She was admitted for suspicion of a small right MCA territory stroke, with confusion, expressive aphasia, and was started on ASA in addition to her coumadin.    The work up, however, failed to show any CVA, with MRI, MRA, carotid showing no definite abnormalities.   She was seen by neurology, who felt that she did not have any CVA, with negative imaging, and did not recommend ASA onto her Coumadin.   PT was consulted, and recommended home PT.   I think it is reasonable, so I have ordered it.  She will be discharged on her home meds, with the addition of a statin.  She will resume her BP meds upon discharge.  She is now having no confusion and fluent speech.  Dr Merlene Laughter had recommended EEG to exclude complex partial seizure, but she and her husband did not want to stay for an EEG.   Procedures:  Carotid US, MRI/MRA brain.   Consultations:  Neurology consultation.   Discharge Exam: Filed Vitals:   04/22/15 1010 04/22/15 1115  BP: 140/59 111/46  Pulse: 58 57  Temp:  97.4 F (36.3 C)  Resp:  20     Discharge Instructions   Discharge Instructions    Diet - low sodium heart healthy    Complete by:  As directed      Discharge instructions    Complete by:  As directed   Follow up with your PCP in one week.l     Increase activity slowly    Complete by:  As directed           Current Discharge Medication List    START taking these  medications   Details  atorvastatin (LIPITOR) 40 MG tablet Take 1 tablet (40 mg total) by mouth daily at 6 PM. Qty: 30 tablet, Refills: 1    pantoprazole (PROTONIX) 40 MG tablet Take 1 tablet (40 mg total) by mouth daily at 6 (six) AM. Qty: 30 tablet, Refills: 1      CONTINUE these medications which have NOT CHANGED   Details  atenolol (TENORMIN) 25 MG tablet Take 25 mg by mouth daily. Reported on 01/27/2015    ergocalciferol (VITAMIN D2) 50000 UNITS capsule Take 50,000 Units by mouth every Sunday. Reported on 01/27/2015    flecainide (TAMBOCOR) 100 MG tablet Take 1 tablet (100 mg total) by mouth 2 (two) times daily. Qty: 60 tablet, Refills: 0    furosemide (LASIX) 40 MG tablet Take 0.5-1 tablets (20-40  mg total) by mouth daily. Take 1/2 tablet (20 mg) PO every morning, take 1 tablet (40 mg) PO if swelling occurs. Qty: 90 tablet, Refills: 1    GARLIC PO Take 1 tablet by mouth daily after supper.     levothyroxine (SYNTHROID, LEVOTHROID) 50 MCG tablet Take 50 mcg by mouth daily before breakfast.     lisinopril (PRINIVIL,ZESTRIL) 10 MG tablet Take 1 tablet (10 mg total) by mouth daily. Qty: 30 tablet, Refills: 5    oxybutynin (DITROPAN) 5 MG tablet Take 1 tablet (5 mg total) by mouth 3 (three) times daily. Qty: 90 tablet, Refills: 3    potassium chloride SA (K-DUR,KLOR-CON) 20 MEQ tablet Take 20 mEq by mouth every morning. Take with food    vitamin B-12 (CYANOCOBALAMIN) 500 MCG tablet Take 500 mcg by mouth every Monday, Wednesday, and Friday.     warfarin (COUMADIN) 5 MG tablet Take 5 mg by mouth every morning.     zolpidem (AMBIEN) 10 MG tablet TAKE 1/2 TO 1 TABLET AT BEDTIME AS NEEDED FOR INSOMNIA Qty: 15 tablet, Refills: 2      STOP taking these medications     acetaminophen (TYLENOL) 500 MG tablet      mupirocin ointment (BACTROBAN) 2 %        Allergies  Allergen Reactions  . Penicillins Rash    Has patient had a PCN reaction causing immediate rash, facial/tongue/throat swelling, SOB or lightheadedness with hypotension: Yes Has patient had a PCN reaction causing severe rash involving mucus membranes or skin necrosis: No Has patient had a PCN reaction that required hospitalization No Has patient had a PCN reaction occurring within the last 10 years: No If all of the above answers are "NO", then may proceed with Cephalosporin use.      The results of significant diagnostics from this hospitalization (including imaging, microbiology, ancillary and laboratory) are listed below for reference.    Significant Diagnostic Studies: Ct Head Wo Contrast  04/21/2015  CLINICAL DATA:  Acute onset of altered mental status at 6 a.m. today. EXAM: CT HEAD WITHOUT CONTRAST TECHNIQUE:  Contiguous axial images were obtained from the base of the skull through the vertex without intravenous contrast. COMPARISON:  CT scan dated 03/03/2015 and MRI dated 03/03/2015 FINDINGS: There is no acute intracranial hemorrhage, acute infarction, or intracranial mass lesion. Small old infarct adjacent to the anterior limb of the right internal capsule. Periventricular white matter lucency most prominent in the posterior parietal regions, stable, consistent with chronic small vessel ischemic disease. Bones are normal. Minimal atrophy. IMPRESSION: No acute abnormality. Chronic small vessel ischemic disease. Tiny old lacunar infarct anterior right basal ganglia. Electronically Signed   By: Jeneen Rinks  Maxwell M.D.   On: 04/21/2015 11:06   Mr Virgel Paling Wo Contrast  04/21/2015  CLINICAL DATA:  82 year old female with altered mental status and weakness for 3 days. Initial encounter. EXAM: MRI HEAD WITHOUT CONTRAST MRA HEAD WITHOUT CONTRAST TECHNIQUE: Multiplanar, multiecho pulse sequences of the brain and surrounding structures were obtained without intravenous contrast. Angiographic images of the head were obtained using MRA technique without contrast. COMPARISON:  Head CT without contrast 1052 hours today. Brain MRI 03/03/2015 and earlier. FINDINGS: Study is intermittently degraded by motion artifact despite repeated imaging attempts. MRI HEAD FINDINGS Major intracranial vascular flow voids appear stable. No restricted diffusion to suggest acute infarction. No midline shift, mass effect, evidence of mass lesion, ventriculomegaly, extra-axial collection or acute intracranial hemorrhage. Cervicomedullary junction and pituitary are within normal limits. Stable gray and white matter signal throughout the brain, patchy and confluent nonspecific white matter T2 and FLAIR hyperintensity. No cortical encephalomalacia or chronic cerebral blood products identified. Visible internal auditory structures appear normal. Mastoids are  clear. Paranasal sinuses, orbits, and scalp soft tissues appear grossly stable. Negative visualized cervical spine. MRA HEAD FINDINGS Stable antegrade flow in the posterior circulation with fairly codominant distal vertebral arteries. Normal PICA origins, vertebrobasilar junction, basilar artery, SCA, and PCA origins. Posterior communicating arteries are diminutive or absent. Bilateral PCA branches appear stable and within normal limits. Stable antegrade flow in both ICA siphons. Patent carotid termini. No siphon stenosis identified. MCA and ACA origins remain normal. Anterior communicating artery and visualized bilateral ACA branches appear stable and within normal limits. Visualized bilateral MCA branches appear stable and within normal limits. IMPRESSION: 1.  No acute intracranial abnormality. 2. Stable noncontrast MRI appearance of the brain since February. 3. Stable and negative for age intracranial MRA. Electronically Signed   By: Genevie [name] M.D.   On: 04/21/2015 17:18   Mr Brain Wo Contrast  04/21/2015  CLINICAL DATA:  82 year old female with altered mental status and weakness for 3 days. Initial encounter. EXAM: MRI HEAD WITHOUT CONTRAST MRA HEAD WITHOUT CONTRAST TECHNIQUE: Multiplanar, multiecho pulse sequences of the brain and surrounding structures were obtained without intravenous contrast. Angiographic images of the head were obtained using MRA technique without contrast. COMPARISON:  Head CT without contrast 1052 hours today. Brain MRI 03/03/2015 and earlier. FINDINGS: Study is intermittently degraded by motion artifact despite repeated imaging attempts. MRI HEAD FINDINGS Major intracranial vascular flow voids appear stable. No restricted diffusion to suggest acute infarction. No midline shift, mass effect, evidence of mass lesion, ventriculomegaly, extra-axial collection or acute intracranial hemorrhage. Cervicomedullary junction and pituitary are within normal limits. Stable gray and white matter  signal throughout the brain, patchy and confluent nonspecific white matter T2 and FLAIR hyperintensity. No cortical encephalomalacia or chronic cerebral blood products identified. Visible internal auditory structures appear normal. Mastoids are clear. Paranasal sinuses, orbits, and scalp soft tissues appear grossly stable. Negative visualized cervical spine. MRA HEAD FINDINGS Stable antegrade flow in the posterior circulation with fairly codominant distal vertebral arteries. Normal PICA origins, vertebrobasilar junction, basilar artery, SCA, and PCA origins. Posterior communicating arteries are diminutive or absent. Bilateral PCA branches appear stable and within normal limits. Stable antegrade flow in both ICA siphons. Patent carotid termini. No siphon stenosis identified. MCA and ACA origins remain normal. Anterior communicating artery and visualized bilateral ACA branches appear stable and within normal limits. Visualized bilateral MCA branches appear stable and within normal limits. IMPRESSION: 1.  No acute intracranial abnormality. 2. Stable noncontrast MRI appearance of the brain since February.  3. Stable and negative for age intracranial MRA. Electronically Signed   By: Genevie [name] M.D.   On: 04/21/2015 17:18   US Carotid Bilateral  04/21/2015  CLINICAL DATA:  CVA EXAM: BILATERAL CAROTID DUPLEX ULTRASOUND TECHNIQUE: Pearline Cables scale imaging, color Doppler and duplex ultrasound were performed of bilateral carotid and vertebral arteries in the neck. COMPARISON:  None. FINDINGS: Criteria: Quantification of carotid stenosis is based on velocity parameters that correlate the residual internal carotid diameter with NASCET-based stenosis levels, using the diameter of the distal internal carotid lumen as the denominator for stenosis measurement. The following velocity measurements were obtained: RIGHT ICA:  6 7/12 cm/sec CCA:  6 6/9 cm/sec SYSTOLIC ICA/CCA RATIO:  1.0 DIASTOLIC ICA/CCA RATIO:  1.4 ECA:  74 cm/sec LEFT ICA:   111/26 cm/sec CCA:  [id number] cm/sec SYSTOLIC ICA/CCA RATIO:  0.7 DIASTOLIC ICA/CCA RATIO:  3.4 ECA:  70 cm/sec RIGHT CAROTID ARTERY: Grayscale images demonstrate minimal intimal thickening within the right common carotid artery. No significant atherosclerotic plaque is identified. The waveforms, velocities and flow velocity ratios show no evidence focal hemodynamically significant stenosis. RIGHT VERTEBRAL ARTERY:  Antegrade in nature. LEFT CAROTID ARTERY: Linear grayscale images demonstrate minimal intimal thickening in the common carotid artery. No significant plaque formation is noted in the carotid system. The waveforms, velocities and flow velocity ratios show no evidence of focal hemodynamically significant stenosis. LEFT VERTEBRAL ARTERY:  Antegrade in nature. IMPRESSION: No evidence of focal hemodynamically significant stenosis bilaterally. Electronically Signed   By: Inez Catalina M.D.   On: 04/21/2015 18:07    Microbiology: Recent Results (from the past 240 hour(s))  Microscopic Examination     Status: Abnormal   Collection Time: 04/14/15  3:01 PM  Result Value Ref Range Status   WBC, UA 0-5 0 -  5 /hpf Final   RBC, UA 0-2 0 -  2 /hpf Final   Epithelial Cells (non renal) 0-10 0 - 10 /hpf Final   Renal Epithel, UA 0-10 (A) None seen /hpf Final   Bacteria, UA None seen None seen/Few Final  Urine culture     Status: None   Collection Time: 04/14/15  3:31 PM  Result Value Ref Range Status   Urine Culture, Routine Final report  Final   Urine Culture result 1 Comment  Final    Comment: Mixed urogenital flora Greater than 100,000 colony forming units per mL      Labs: Basic Metabolic Panel:  Recent Labs Lab 04/21/15 1026 04/21/15 1027  NA 129* 128*  K 3.5 3.5  CL 89* 86*  CO2 29  --   GLUCOSE 125* 120*  BUN 10 10  CREATININE 0.78 0.80  CALCIUM 8.6*  --    Liver Function Tests:  Recent Labs Lab 04/21/15 1026  AST 22  ALT 12*  ALKPHOS 46  BILITOT 0.5  PROT 7.2  ALBUMIN  4.0   CBC:  Recent Labs Lab 04/21/15 1026 04/21/15 1027  WBC 9.9  --   NEUTROABS 8.1*  --   HGB 13.6 15.3*  HCT 39.9 45.0  MCV 91.7  --   PLT 312  --    BNP: BNP (last 3 results)  Recent Labs  01/12/15 0122  BNP 273.1*   CBG:  Recent Labs Lab 04/21/15 1034  GLUCAP 114*   Signed:  Ariann Khaimov MD. Rosalita Chessman.  [facility] 04/22/2015, 2:14 PM

## 2015-04-22 NOTE — Progress Notes (Signed)
[place] for Coumadin (chronic Rx PTA) Indication: atrial fibrillation  Allergies  Allergen Reactions  . Penicillins Rash    Has patient had a PCN reaction causing immediate rash, facial/tongue/throat swelling, SOB or lightheadedness with hypotension: Yes Has patient had a PCN reaction causing severe rash involving mucus membranes or skin necrosis: No Has patient had a PCN reaction that required hospitalization No Has patient had a PCN reaction occurring within the last 10 years: No If all of the above answers are "NO", then may proceed with Cephalosporin use.   Patient Measurements: Height: 5\' 3"  (160 cm) Weight: 132 lb 6.4 oz (60.056 kg) IBW/kg (Calculated) : 52.4  Vital Signs: Temp: 98.4 F (36.9 C) (04/18 0715) Temp Source: Oral (04/18 0715) BP: 135/52 mmHg (04/18 0715) Pulse Rate: 60 (04/18 0715)  Labs:  Recent Labs  04/21/15 1026 04/21/15 1027 04/22/15 0550  HGB 13.6 15.3*  --   HCT 39.9 45.0  --   PLT 312  --   --   APTT 37  --   --   LABPROT 22.6*  --  29.4*  INR 2.01*  --  2.84*  CREATININE 0.78 0.80  --    Estimated Creatinine Clearance: 45.6 mL/min (by C-G formula based on Cr of 0.8).  Medical History: Past Medical History  Diagnosis Date  . Atrial fibrillation (HCC)     Paroxysmal, Flecainide therapy  . Warfarin anticoagulation   . Hypothyroidism   . Carotid bruit     Doppler, December, 2009, no abnormality  . Cardiomegaly     EF normal, echo, 2003  //   EF 60-65%, echo, June 18, 123456, diastolic dysfunction,  . Normal nuclear stress test     Normal, 2001  . HLD (hyperlipidemia)   . HTN (hypertension)   . Osteoarthritis   . Calf pain     September, 2012, at rest  . PONV (postoperative nausea and vomiting)   . GERD (gastroesophageal reflux disease)   . Anemia   . Rectal bleeding 2001    diverticulosis and int hemorrhoids on 07/1999 and 02/2010 colonoscopies.  . Hypothyroidism   . Cataract   . Stroke  ([place])   . Insomnia   . History of shingles    Medications:  Prescriptions prior to admission  Medication Sig Dispense Refill Last Dose  . acetaminophen (TYLENOL) 500 MG tablet Take 500 mg by mouth every 6 (six) hours as needed for moderate pain.   unknown  . atenolol (TENORMIN) 25 MG tablet Take 25 mg by mouth daily. Reported on 01/27/2015   04/21/2015 at 0700  . ergocalciferol (VITAMIN D2) 50000 UNITS capsule Take 50,000 Units by mouth every Sunday. Reported on 01/27/2015   04/20/2015 at Unknown time  . flecainide (TAMBOCOR) 100 MG tablet Take 1 tablet (100 mg total) by mouth 2 (two) times daily. 60 tablet 0 04/21/2015 at Unknown time  . furosemide (LASIX) 40 MG tablet Take 0.5-1 tablets (20-40 mg total) by mouth daily. Take 1/2 tablet (20 mg) PO every morning, take 1 tablet (40 mg) PO if swelling occurs. (Patient taking differently: Take 40 mg by mouth daily. ) 90 tablet 1 04/21/2015 at Unknown time  . GARLIC PO Take 1 tablet by mouth daily after supper.    04/20/2015 at Unknown time  . levothyroxine (SYNTHROID, LEVOTHROID) 50 MCG tablet Take 50 mcg by mouth daily before breakfast.    04/21/2015 at Unknown time  . lisinopril (PRINIVIL,ZESTRIL) 10 MG tablet Take 1 tablet (10 mg  total) by mouth daily. (Patient taking differently: Take 10 mg by mouth every evening. ) 30 tablet 5 04/20/2015 at Unknown time  . mupirocin ointment (BACTROBAN) 2 % APPLY INTO NOSTRIL(S) 2 TIMES A DAY AS DIRECTED 22 g 0 Past Week at Unknown time  . oxybutynin (DITROPAN) 5 MG tablet Take 1 tablet (5 mg total) by mouth 3 (three) times daily. 90 tablet 3 04/21/2015 at Unknown time  . potassium chloride SA (K-DUR,KLOR-CON) 20 MEQ tablet Take 20 mEq by mouth every morning. Take with food   04/21/2015 at Unknown time  . vitamin B-12 (CYANOCOBALAMIN) 500 MCG tablet Take 500 mcg by mouth every Monday, Wednesday, and Friday.    04/21/2015 at Unknown time  . warfarin (COUMADIN) 5 MG tablet Take 5 mg by mouth every morning.    04/21/2015 at 0700   . zolpidem (AMBIEN) 10 MG tablet TAKE 1/2 TO 1 TABLET AT BEDTIME AS NEEDED FOR INSOMNIA 15 tablet 2 04/20/2015 at Unknown time    Assessment: 82yo female on chronic Coumadin for h/o afib.  INR is therapeutic on admission.  INR is therapeutic today at 2.84.   Goal of Therapy:  INR 2-3 Monitor platelets by anticoagulation protocol: Yes   Plan:   Coumadin 5mg  today  INR daily  Isac Sarna, BS [place]ena [place], BCPS Clinical Pharmacist Pager [phone] 04/22/2015,8:44 AM

## 2015-04-22 NOTE — Telephone Encounter (Signed)
Patient being discharged from hospital and OP PT is recommended for the patient   Please order

## 2015-04-22 NOTE — Evaluation (Signed)
Occupational Therapy Evaluation Patient Details Name: [name] MRN: [id number] DOB: 05/05/1932 Today's Date: 04/22/2015    History of Present Illness [name] is an right handed 82 y.o. 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 Her ictus was over 12 hours ago. She did not have upper or lower extremity weakness, or facial droop. In the ER, her symptoms improved, with no confusion and was having mininal expressive aphasia. Work up included a head CT which showed no acute process, old lacunar infarct, and EKG showed no afib.    Clinical Impression   Pt awake, alert, oriented x4 this am, agreeable to OT evaluation. Pt reports she feels much improved this am, husband present and affirms pt statements. Pt is independent in ADL and functional mobility tasks. Sensation and coordination appear intact, no word finding difficulties during evaluation. No further OT services required at this time, as pt is at her baseline.     Follow Up Recommendations  No OT follow up    Equipment Recommendations  None recommended by OT       Precautions / Restrictions Precautions Precautions: None Restrictions Weight Bearing Restrictions: No      Mobility Bed Mobility Overal bed mobility: Modified Independent                Transfers Overall transfer level: Modified independent                         ADL Overall ADL's : Modified independent;At baseline                     Lower Body Dressing: Modified independent;Sitting/lateral leans               Functional mobility during  ADLs: Modified independent       Vision Vision Assessment?: Yes Eye Alignment: Within Functional Limits Ocular Range of Motion: Within Functional Limits Alignment/Gaze Preference: Within Defined Limits Tracking/Visual Pursuits: Able to track stimulus in all quads without difficulty Saccades: Within functional limits Convergence: Within functional limits Visual Fields: No apparent deficits          Pertinent Vitals/Pain Pain Assessment: No/denies pain     Hand Dominance Right   Extremity/Trunk Assessment Upper Extremity Assessment Upper Extremity Assessment: Overall WFL for tasks assessed (Pt with limited shoulder ROM due to past injury)   Lower Extremity Assessment Lower Extremity Assessment: Defer to PT evaluation       Communication Communication Communication: No difficulties   Cognition Arousal/Alertness: Awake/alert Behavior During Therapy: WFL for tasks assessed/performed Overall Cognitive Status: Within Functional Limits for tasks assessed                                Home Living Family/patient expects to be discharged to:: Private residence Living Arrangements: Spouse/significant other Available Help at Discharge: Family;Available 24 hours/day               Bathroom Shower/Tub: Teacher, early years/pre: Standard     Home Equipment: Environmental consultant - 2 wheels;Bedside commode;Tub  bench;Grab bars - tub/shower;Adaptive equipment   Additional Comments: Pt reports she has DME available however does not use SPC or walker.       Prior Functioning/Environment Level of Independence: Independent              End of Session    Activity Tolerance: Patient tolerated treatment well Patient left: in chair;with family/visitor present   Time: 0850-0906 OT Time Calculation (min): 16 min Charges:  OT General Charges $OT Visit: 1 Procedure OT Evaluation $OT Eval Low Complexity: 1 Procedure G-Codes: OT G-codes **NOT FOR INPATIENT  CLASS** Functional Assessment Tool Used: clinical judgement Functional Limitation: Self care Self Care Current Status [id number]): At least 1 percent but less than 20 percent impaired, limited or restricted Self Care Goal Status [id number]): At least 1 percent but less than 20 percent impaired, limited or restricted Self Care Discharge Status [phone]): At least 1 percent but less than 20 percent impaired, limited or restricted  [name], OTR/L  [phone]  04/22/2015, 9:25 AM

## 2015-04-22 NOTE — Telephone Encounter (Signed)
I have to have a face to face encounter to order a face to face order. Usually this is taken care of in the hsopital.   I am glad to order it but I have to see her for the order to written.   Laroy Apple, MD [facility] 04/22/2015, 3:43 PM

## 2015-04-22 NOTE — Progress Notes (Signed)
Offsite EEG completed at [facility].  Results pending. 

## 2015-04-22 NOTE — Telephone Encounter (Signed)
No answer, no VM to leave mssg

## 2015-04-22 NOTE — Care Management Note (Signed)
Case Management Note  Patient Details  Name: [name] MRN: [id number] Date of Birth: 11-12-1932  Subjective/Objective:                  Admitted with CVA. Pt is from home, lives with husband and is ind with ADL's. Pt will need PT services at DC. Pt prefers HH as she is mostly homebound and would like to use [facility]. Romualdo Bolk, of [facility], made aware of referral and will obtain pt info from chart. Pt is aware HH has 48 hours to initiate services. No further CM needs anticiapted.   Expected Discharge Date:      04/22/2015            Expected Discharge Plan:  [place]  In-House Referral:  NA  Discharge planning Services  CM Consult  Post Acute Care Choice:  NA Choice offered to:  NA  DME Arranged:    DME Agency:     HH Arranged:  PT HH Agency:  [place]  Status of Service:  Completed, signed off  Medicare Important Message Given:    Date Medicare IM Given:    Medicare IM give by:    Date Additional Medicare IM Given:    Additional Medicare Important Message give by:     If discussed at [place] of Stay Meetings, dates discussed:    Additional Comments:  Sherald Barge, RN 04/22/2015, 1:40 PM

## 2015-04-22 NOTE — Progress Notes (Signed)
[facility]  Patient Status: New  [facility] is providing the following services: PT  If patient discharges after hours, please call [phone].   [name] 04/22/2015, 3:18 PM

## 2015-04-22 NOTE — Telephone Encounter (Signed)
Needs an appt scheduled.

## 2015-04-22 NOTE — Care Management Obs Status (Signed)
[place] NOTIFICATION   Patient Details  Name: [name] MRN: [id number] Date of Birth: 1932-08-06   Medicare Observation Status Notification Given:  Yes    Sherald Barge, RN 04/22/2015, 1:39 PM

## 2015-04-23 DIAGNOSIS — Z7901 Long term (current) use of anticoagulants: Secondary | ICD-10-CM | POA: Diagnosis not present

## 2015-04-23 DIAGNOSIS — I480 Paroxysmal atrial fibrillation: Secondary | ICD-10-CM | POA: Diagnosis not present

## 2015-04-23 DIAGNOSIS — Z79891 Long term (current) use of opiate analgesic: Secondary | ICD-10-CM | POA: Diagnosis not present

## 2015-04-23 DIAGNOSIS — Z8673 Personal history of transient ischemic attack (TIA), and cerebral infarction without residual deficits: Secondary | ICD-10-CM | POA: Diagnosis not present

## 2015-04-23 DIAGNOSIS — E785 Hyperlipidemia, unspecified: Secondary | ICD-10-CM | POA: Diagnosis not present

## 2015-04-23 DIAGNOSIS — I5032 Chronic diastolic (congestive) heart failure: Secondary | ICD-10-CM | POA: Diagnosis not present

## 2015-04-23 DIAGNOSIS — I110 Hypertensive heart disease with heart failure: Secondary | ICD-10-CM | POA: Diagnosis not present

## 2015-04-23 DIAGNOSIS — E039 Hypothyroidism, unspecified: Secondary | ICD-10-CM | POA: Diagnosis not present

## 2015-04-23 LAB — HEMOGLOBIN A1C
HEMOGLOBIN A1C: 5.6 % (ref 4.8–5.6)
Mean Plasma Glucose: 114 mg/dL

## 2015-04-23 LAB — HOMOCYSTEINE: Homocysteine: 10.6 umol/L (ref 0.0–15.0)

## 2015-04-29 NOTE — Procedures (Signed)
  Terri A. Merlene Laughter, MD     [email or web address]           HISTORY: The patient 82 year old presents with acute confusional spell. The study is being done to evaluate for seizures.  MEDICATIONS: Scheduled Meds: Continuous Infusions: PRN Meds:.  Prior to Admission medications   Medication Sig Start Date End Date Taking? Authorizing Provider  atenolol (TENORMIN) 25 MG tablet Take 25 mg by mouth daily. Reported on 01/27/2015    Historical Provider, MD  atorvastatin (LIPITOR) 40 MG tablet Take 1 tablet (40 mg total) by mouth daily at 6 PM. 04/22/15   Orvan Falconer, MD  ergocalciferol (VITAMIN D2) 50000 UNITS capsule Take 50,000 Units by mouth every Sunday. Reported on 01/27/2015    Historical Provider, MD  flecainide (TAMBOCOR) 100 MG tablet Take 1 tablet (100 mg total) by mouth 2 (two) times daily. 01/28/15   Timmothy Euler, MD  furosemide (LASIX) 40 MG tablet Take 0.5-1 tablets (20-40 mg total) by mouth daily. Take 1/2 tablet (20 mg) PO every morning, take 1 tablet (40 mg) PO if swelling occurs. Patient taking differently: Take 40 mg by mouth daily.  04/11/15   Timmothy Euler, MD  GARLIC PO Take 1 tablet by mouth daily after supper.     Historical Provider, MD  levothyroxine (SYNTHROID, LEVOTHROID) 50 MCG tablet Take 50 mcg by mouth daily before breakfast.  01/20/15   Historical Provider, MD  lisinopril (PRINIVIL,ZESTRIL) 10 MG tablet Take 1 tablet (10 mg total) by mouth daily. Patient taking differently: Take 10 mg by mouth every evening.  02/19/15   Timmothy Euler, MD  oxybutynin (DITROPAN) 5 MG tablet Take 1 tablet (5 mg total) by mouth 3 (three) times daily. 04/14/15   Timmothy Euler, MD  pantoprazole (PROTONIX) 40 MG tablet Take 1 tablet (40 mg total) by mouth daily at 6 (six) AM. 04/22/15   Orvan Falconer, MD  potassium chloride SA (K-DUR,KLOR-CON) 20 MEQ tablet Take 20 mEq by mouth every morning. Take with food 10/19/12   Historical Provider, MD  vitamin B-12  (CYANOCOBALAMIN) 500 MCG tablet Take 500 mcg by mouth every Monday, Wednesday, and Friday.     Historical Provider, MD  warfarin (COUMADIN) 5 MG tablet Take 5 mg by mouth every morning.     Historical Provider, MD  zolpidem (AMBIEN) 10 MG tablet TAKE 1/2 TO 1 TABLET AT BEDTIME AS NEEDED FOR INSOMNIA 03/18/15   Timmothy Euler, MD      ANALYSIS: A 16 channel recording using standard 10 20 measurements is conducted for 22 minutes. There is a well-formed posterior dominant rhythm of 10 Hz and attenuates with eye opening. There is beta activities over the frontal areas. Awake and sleep activities are observed. K complexes and sleep spindles are recorded. Additionally, vertex sharp waves are also seen. There is no focal or lateral slowing. There is no epileptiform activity observed.    IMPRESSION:  This is a normal recording of awake and sleep states.      Rane Blitch A. [name], M.D.  Diplomate, Tax adviser of Psychiatry and Neurology ( Neurology).

## 2015-05-02 ENCOUNTER — Ambulatory Visit (INDEPENDENT_AMBULATORY_CARE_PROVIDER_SITE_OTHER): Payer: Medicare Other | Admitting: Family Medicine

## 2015-05-02 ENCOUNTER — Encounter: Payer: Self-pay | Admitting: Family Medicine

## 2015-05-02 VITALS — BP 151/63 | HR 61 | Temp 97.1°F | Ht 63.0 in | Wt 139.2 lb

## 2015-05-02 DIAGNOSIS — R350 Frequency of micturition: Secondary | ICD-10-CM | POA: Diagnosis not present

## 2015-05-02 DIAGNOSIS — R1030 Lower abdominal pain, unspecified: Secondary | ICD-10-CM | POA: Diagnosis not present

## 2015-05-02 DIAGNOSIS — R410 Disorientation, unspecified: Secondary | ICD-10-CM | POA: Diagnosis not present

## 2015-05-02 DIAGNOSIS — N3281 Overactive bladder: Secondary | ICD-10-CM | POA: Insufficient documentation

## 2015-05-02 DIAGNOSIS — E871 Hypo-osmolality and hyponatremia: Secondary | ICD-10-CM

## 2015-05-02 LAB — URINALYSIS, COMPLETE
BILIRUBIN UA: NEGATIVE
GLUCOSE, UA: NEGATIVE
KETONES UA: NEGATIVE
Leukocytes, UA: NEGATIVE
NITRITE UA: NEGATIVE
PROTEIN UA: NEGATIVE
SPEC GRAV UA: 1.01 (ref 1.005–1.030)
UUROB: 0.2 mg/dL (ref 0.2–1.0)
pH, UA: 6 (ref 5.0–7.5)

## 2015-05-02 LAB — MICROSCOPIC EXAMINATION
BACTERIA UA: NONE SEEN
Epithelial Cells (non renal): >10 /hpf — AB (ref 0–10)
Renal Epithel, UA: >10 /hpf — AB

## 2015-05-02 NOTE — Patient Instructions (Signed)
Great to see you!  I am going to do some basic blood work and check for C diff  Please be sure to see the GI doctor

## 2015-05-02 NOTE — Progress Notes (Signed)
   HPI  Patient presents today for hospital follow-up and to discuss abdominal pain.  Patient explains that over the last month or so she's had urinary frequency and abdominal pain. She describes the abdominal pain is crampy in nature and bilateral lower quadrant abdominal pain. No aggravating or alleviating factors that are clear She has had frequent diarrhea sometimes having a hard time making it to the bathroom, she states that the smells very foul similar to previous episode of C. difficile.  She denies any fever, chills, sweats. She has decreased appetite but is tolerating food and fluids normally.  Since leaving the hospital she feels less confused, however she did have an episode this morning of confusion and feels better currently. She states that her abdominal pain and urinary frequency seem to be worse since leaving the hospital.  She does have good relief with ditropan but states that it wears off pretty fast  PMH: Smoking status noted ROS: Per HPI  Objective: BP 151/63 mmHg  Pulse 61  Temp(Src) 97.1 F (36.2 C) (Oral)  Ht 5' 3" (1.6 m)  Wt 139 lb 3.2 oz (63.141 kg)  BMI 24.66 kg/m2 Gen: NAD, alert, cooperative with exam HEENT: NCAT CV: RRR, good S1/S2, no murmur Resp: CTABL, no wheezes, non-labored Abd: Soft, mild tenderness to palpation throughout Ext: No edema, warm Neuro: Alert and oriented times person, place, time, president, situation  Assessment and plan:  # Confusion Mostly resolved, recurrent this morning. Discussed the possibility that Ambien can be contributing, I have discussed the idea of cutting back to 2.5 mg at night I don't see any good alternatives, could consider trazodone but I am confident this would not be as good sleep aid for her and also would. Possibility of side effect as well. Alert and oriented 4 currently  # Abdominal pain Possibly overactive bladder, unlikely UTI Urinalysis today is reassuring Basic labs Check for C.  difficile She is established with GI so I encouraged her to have close follow-up for their opinion     Orders Placed This Encounter  Procedures  . Clostridium Difficile by PCR    Order Specific Question:  Is your patient experiencing loose or watery stools (3 or more in 24 hours)?    Answer:  Yes    Order Specific Question:  Has the patient received laxatives in the last 24 hours?    Answer:  No    Order Specific Question:  Has a negative Cdiff test resulted in the last 7 days?    Answer:  No  . Urinalysis, Complete  . CBC with Differential  . CMP14+EGFR    Meds ordered this encounter  Medications  . gabapentin (NEURONTIN) 300 MG capsule    Sig: Take 300 mg by mouth 3 (three) times daily.    Sam , MD [facility] 05/02/2015, 1:37 PM      

## 2015-05-03 ENCOUNTER — Other Ambulatory Visit: Payer: Self-pay | Admitting: Family

## 2015-05-03 LAB — CMP14+EGFR
A/G RATIO: 1.8 (ref 1.2–2.2)
ALK PHOS: 48 IU/L (ref 39–117)
ALT: 10 IU/L (ref 0–32)
AST: 15 IU/L (ref 0–40)
Albumin: 4.1 g/dL (ref 3.5–4.7)
BUN / CREAT RATIO: 18 (ref 12–28)
BUN: 10 mg/dL (ref 8–27)
Bilirubin Total: 0.4 mg/dL (ref 0.0–1.2)
CO2: 25 mmol/L (ref 18–29)
CREATININE: 0.57 mg/dL (ref 0.57–1.00)
Calcium: 8.6 mg/dL — ABNORMAL LOW (ref 8.7–10.3)
Chloride: 84 mmol/L — ABNORMAL LOW (ref 96–106)
GFR calc Af Amer: 101 mL/min/{1.73_m2} (ref >59)
GFR calc non Af Amer: 87 mL/min/{1.73_m2} (ref >59)
GLOBULIN, TOTAL: 2.3 g/dL (ref 1.5–4.5)
Glucose: 110 mg/dL — ABNORMAL HIGH (ref 65–99)
Potassium: 4.2 mmol/L (ref 3.5–5.2)
SODIUM: 123 mmol/L — AB (ref 134–144)
Total Protein: 6.4 g/dL (ref 6.0–8.5)

## 2015-05-03 LAB — CBC WITH DIFFERENTIAL/PLATELET
BASOS: 0 %
Basophils Absolute: 0 10*3/uL (ref 0.0–0.2)
EOS (ABSOLUTE): 0 10*3/uL (ref 0.0–0.4)
EOS: 1 %
HEMATOCRIT: 37.6 % (ref 34.0–46.6)
Hemoglobin: 12.6 g/dL (ref 11.1–15.9)
Immature Grans (Abs): 0 10*3/uL (ref 0.0–0.1)
Immature Granulocytes: 0 %
LYMPHS ABS: 1.3 10*3/uL (ref 0.7–3.1)
Lymphs: 20 %
MCH: 30.8 pg (ref 26.6–33.0)
MCHC: 33.5 g/dL (ref 31.5–35.7)
MCV: 92 fL (ref 79–97)
MONOS ABS: 0.7 10*3/uL (ref 0.1–0.9)
Monocytes: 10 %
Neutrophils Absolute: 4.7 10*3/uL (ref 1.4–7.0)
Neutrophils: 69 %
Platelets: 294 10*3/uL (ref 150–379)
RBC: 4.09 x10E6/uL (ref 3.77–5.28)
RDW: 14.1 % (ref 12.3–15.4)
WBC: 6.8 10*3/uL (ref 3.4–10.8)

## 2015-05-05 ENCOUNTER — Telehealth: Payer: Self-pay | Admitting: Pharmacist

## 2015-05-05 ENCOUNTER — Other Ambulatory Visit: Payer: Self-pay | Admitting: Family Medicine

## 2015-05-05 ENCOUNTER — Telehealth: Payer: Self-pay

## 2015-05-05 DIAGNOSIS — R3981 Functional urinary incontinence: Secondary | ICD-10-CM | POA: Diagnosis not present

## 2015-05-05 DIAGNOSIS — E871 Hypo-osmolality and hyponatremia: Secondary | ICD-10-CM

## 2015-05-05 DIAGNOSIS — I4891 Unspecified atrial fibrillation: Secondary | ICD-10-CM | POA: Diagnosis not present

## 2015-05-05 DIAGNOSIS — I10 Essential (primary) hypertension: Secondary | ICD-10-CM | POA: Diagnosis not present

## 2015-05-05 DIAGNOSIS — R4182 Altered mental status, unspecified: Secondary | ICD-10-CM | POA: Diagnosis not present

## 2015-05-05 NOTE — Telephone Encounter (Signed)
I wasn't planning on it but it may be helpful if her hyponatremia isnt quickly improving to have one arranged.   Laroy Apple, MD [facility] 05/05/2015, 12:15 PM

## 2015-05-05 NOTE — Telephone Encounter (Signed)
Patient was confused as to whether you were referring her to a kidney Dr.?

## 2015-05-05 NOTE — Addendum Note (Signed)
Addended by: Nigel Berthold C on: 05/05/2015 09:46 AM   Modules accepted: Orders, SmartSet

## 2015-05-06 NOTE — Telephone Encounter (Signed)
Patient aware.

## 2015-05-06 NOTE — Telephone Encounter (Signed)
i think Dr Wendi Snipes nurse called her to tell her that they placed a referral for Nephrology

## 2015-05-07 ENCOUNTER — Other Ambulatory Visit: Payer: Medicare Other

## 2015-05-07 DIAGNOSIS — E871 Hypo-osmolality and hyponatremia: Secondary | ICD-10-CM | POA: Diagnosis not present

## 2015-05-07 DIAGNOSIS — R1030 Lower abdominal pain, unspecified: Secondary | ICD-10-CM

## 2015-05-08 LAB — BMP8+EGFR
BUN / CREAT RATIO: 13 (ref 12–28)
BUN: 9 mg/dL (ref 8–27)
CO2: 26 mmol/L (ref 18–29)
CREATININE: 0.7 mg/dL (ref 0.57–1.00)
Calcium: 8.9 mg/dL (ref 8.7–10.3)
Chloride: 91 mmol/L — ABNORMAL LOW (ref 96–106)
GFR calc non Af Amer: 82 mL/min/{1.73_m2} (ref >59)
GFR, EST AFRICAN AMERICAN: 94 mL/min/{1.73_m2} (ref >59)
GLUCOSE: 90 mg/dL (ref 65–99)
Potassium: 4.1 mmol/L (ref 3.5–5.2)
Sodium: 136 mmol/L (ref 134–144)

## 2015-05-08 LAB — OSMOLALITY: OSMOLALITY MEAS: 271 mosm/kg — AB (ref 280–301)

## 2015-05-08 LAB — CLOSTRIDIUM DIFFICILE BY PCR: Toxigenic C. Difficile by PCR: NEGATIVE

## 2015-05-08 LAB — OSMOLALITY, URINE: Osmolality, Ur: 266 mOsmol/kg

## 2015-05-09 ENCOUNTER — Other Ambulatory Visit: Payer: Self-pay | Admitting: Family

## 2015-05-12 ENCOUNTER — Telehealth: Payer: Self-pay | Admitting: Pharmacist

## 2015-05-12 ENCOUNTER — Telehealth: Payer: Self-pay | Admitting: Family Medicine

## 2015-05-12 NOTE — Telephone Encounter (Signed)
Patient last seen in office on 05-02-15. Rx last filled on 4-19 for #15. Please advise. If approved please route to Pool A so nurse can phone in to pharmacy

## 2015-05-12 NOTE — Telephone Encounter (Signed)
LM on VM that INR / protime appt needed.  Can also rescheduled canceled AWV is she is up to it.

## 2015-05-15 ENCOUNTER — Other Ambulatory Visit: Payer: Self-pay

## 2015-05-15 MED ORDER — ATORVASTATIN CALCIUM 40 MG PO TABS: 40.0000 mg | ORAL_TABLET | Freq: Every day | ORAL | Status: DC

## 2015-05-29 DIAGNOSIS — E871 Hypo-osmolality and hyponatremia: Secondary | ICD-10-CM | POA: Diagnosis not present

## 2015-05-29 DIAGNOSIS — K589 Irritable bowel syndrome without diarrhea: Secondary | ICD-10-CM | POA: Diagnosis not present

## 2015-05-31 ENCOUNTER — Other Ambulatory Visit: Payer: Self-pay | Admitting: Family Medicine

## 2015-06-04 ENCOUNTER — Ambulatory Visit (INDEPENDENT_AMBULATORY_CARE_PROVIDER_SITE_OTHER): Payer: Medicare Other | Admitting: Family Medicine

## 2015-06-04 ENCOUNTER — Encounter: Payer: Self-pay | Admitting: Cardiology

## 2015-06-04 ENCOUNTER — Ambulatory Visit (INDEPENDENT_AMBULATORY_CARE_PROVIDER_SITE_OTHER): Payer: Medicare Other | Admitting: Cardiology

## 2015-06-04 VITALS — BP 118/68 | HR 52 | Ht 63.0 in | Wt 137.0 lb

## 2015-06-04 DIAGNOSIS — I480 Paroxysmal atrial fibrillation: Secondary | ICD-10-CM

## 2015-06-04 DIAGNOSIS — I10 Essential (primary) hypertension: Secondary | ICD-10-CM

## 2015-06-04 DIAGNOSIS — Z8673 Personal history of transient ischemic attack (TIA), and cerebral infarction without residual deficits: Secondary | ICD-10-CM

## 2015-06-04 DIAGNOSIS — I5032 Chronic diastolic (congestive) heart failure: Secondary | ICD-10-CM | POA: Diagnosis not present

## 2015-06-04 NOTE — Patient Instructions (Signed)
Medication Instructions:  The current medical regimen is effective;  continue present plan and medications.  Follow-Up: Follow up in 6 months with Dr. Hochrein.  You will receive a letter in the mail 2 months before you are due.  Please call us when you receive this letter to schedule your follow up appointment.  If you need a refill on your cardiac medications before your next appointment, please call your pharmacy.  Thank you for choosing [facility]!!       

## 2015-06-04 NOTE — Progress Notes (Addendum)
HPI The patient has a history of atrial fibrillation and premature contractions.   She has had these paroxysmally. She says they occur maybe every month.  It seems to be a stable pattern. She might take an extra quarter of flecainide and atenolol. It also goes down. She's not had any prolonged episodes. She's not had any presyncope or syncope. She was in the hospital again in April. I reviewed these hospital records extensively. She again had a questionable TIA. She says she has episodes where she has trouble finding her speech. However, CT with MRI and MRA did not show any definite evidence of a stroke. She was seen by neurology. They did not suggest the addition of aspirin. Since then she's had no further events. She does have some dizziness and some gait disturbance. It turns out she is supposed to be using her cane.  She denies any other cardiovascular symptoms.  She does some mild activity such as cleaning house.  She does not get chest pressure, neck or arm discomfort. She does not have new shortness of breath, PND or orthopnea.  Allergies  Allergen Reactions  . Penicillins Rash    Has patient had a PCN reaction causing immediate rash, facial/tongue/throat swelling, SOB or lightheadedness with hypotension: Yes Has patient had a PCN reaction causing severe rash involving mucus membranes or skin necrosis: No Has patient had a PCN reaction that required hospitalization No Has patient had a PCN reaction occurring within the last 10 years: No If all of the above answers are "NO", then may proceed with Cephalosporin use.    Current Outpatient Prescriptions  Medication Sig Dispense Refill  . atenolol (TENORMIN) 25 MG tablet Take 25 mg by mouth daily. Reported on 01/27/2015    . atorvastatin (LIPITOR) 40 MG tablet Take 1 tablet (40 mg total) by mouth daily at 6 PM. 90 tablet 0  . flecainide (TAMBOCOR) 100 MG tablet Take 1 tablet (100 mg total) by mouth 2 (two) times daily. 60 tablet 0  .  furosemide (LASIX) 40 MG tablet Take 0.5-1 tablets (20-40 mg total) by mouth daily. Take 1/2 tablet (20 mg) PO every morning, take 1 tablet (40 mg) PO if swelling occurs. (Patient taking differently: Take 40 mg by mouth daily. ) 90 tablet 1  . gabapentin (NEURONTIN) 300 MG capsule Take 300 mg by mouth daily as needed.     Marland Kitchen GARLIC PO Take 1 tablet by mouth daily after supper.     . levothyroxine (SYNTHROID, LEVOTHROID) 50 MCG tablet Take 50 mcg by mouth daily before breakfast.     . lisinopril (PRINIVIL,ZESTRIL) 10 MG tablet Take 1 tablet by mouth  daily 60 tablet 4  . oxybutynin (DITROPAN) 5 MG tablet Take 1 tablet (5 mg total) by mouth 3 (three) times daily. (Patient taking differently: Take 5 mg by mouth every 8 (eight) hours as needed for bladder spasms. ) 90 tablet 3  . potassium chloride SA (K-DUR,KLOR-CON) 20 MEQ tablet Take 20 mEq by mouth every morning. Take with food    . vitamin B-12 (CYANOCOBALAMIN) 500 MCG tablet Take 500 mcg by mouth every Monday, Wednesday, and Friday.     . warfarin (COUMADIN) 5 MG tablet Take 5 mg by mouth every morning.     . ergocalciferol (VITAMIN D2) 50000 UNITS capsule Take 50,000 Units by mouth every Sunday. Reported on 06/04/2015    . zolpidem (AMBIEN) 10 MG tablet TAKE 1/2 TO 1 TABLET AT BEDTIME AS NEEDED FOR INSOMNIA 15 tablet 1  No current facility-administered medications for this visit.    Past Medical History  Diagnosis Date  . Atrial fibrillation (HCC)     Paroxysmal, Flecainide therapy  . Warfarin anticoagulation   . Hypothyroidism   . Carotid bruit     Doppler, December, 2009, no abnormality  . Cardiomegaly     EF normal, echo, 2003  //   EF 60-65%, echo, June 18, 123456, diastolic dysfunction,  . Normal nuclear stress test     Normal, 2001  . HLD (hyperlipidemia)   . HTN (hypertension)   . Osteoarthritis   . Calf pain     September, 2012, at rest  . PONV (postoperative nausea and vomiting)   . GERD (gastroesophageal reflux disease)     . Anemia   . Rectal bleeding 2001    diverticulosis and int hemorrhoids on 07/1999 and 02/2010 colonoscopies.  . Hypothyroidism   . Cataract   . Stroke ([place])   . Insomnia   . History of shingles     Past Surgical History  Procedure Laterality Date  . Total knee arthroplasty  05/26/10    right  . Total shoulder replacement  01/2010    left  . Shoulder open rotator cuff repair      bilateral  . Total knee arthroplasty  11/17/2010    Procedure: TOTAL KNEE ARTHROPLASTY;  Surgeon: Mauri Pole;  Location: [facility];  Service: Orthopedics;  Laterality: Left;  . Total knee arthroplasty      Right  . Cataract extraction w/phaco Right 04/15/2014    Procedure: CATARACT EXTRACTION PHACO AND INTRAOCULAR LENS PLACEMENT (IOC);  Surgeon: Tonny Branch, MD;  Location: [facility];  Service: Ophthalmology;  Laterality: Right;  CDE: 13.30  . Cataract extraction w/phaco Left 05/13/2014    Procedure: CATARACT EXTRACTION PHACO AND INTRAOCULAR LENS PLACEMENT LEFT EYE;  Surgeon: Tonny Branch, MD;  Location: [facility];  Service: Ophthalmology;  Laterality: Left;  CDE:13.00  . Esophagogastroduodenoscopy N/A 01/13/2015    Procedure: ESOPHAGOGASTRODUODENOSCOPY (EGD);  Surgeon: Jerene Bears, MD;  Location: [facility];  Service: Endoscopy;  Laterality: N/A;  . Cholecystectomy N/A 01/15/2015    Procedure: LAPAROSCOPIC CHOLECYSTECTOMY;  Surgeon: Ralene Ok, MD;  Location: [facility];  Service: General;  Laterality: N/A;    ROS:  Burning pain in her toes.  Otherwise as stated in the HPI and negative for all other systems.  PHYSICAL EXAM BP 118/68 mmHg  Pulse 52  Ht 5\' 3"  (1.6 m)  Wt 137 lb (62.143 kg)  BMI 24.27 kg/m2 GENERAL:  Well appearing NECK:  No jugular venous distention, waveform within normal limits, carotid upstroke brisk and symmetric, no bruits, no thyromegaly LUNGS:  Clear to auscultation bilaterally BACK:  No CVA tenderness CHEST:  Unremarkable HEART:  PMI not displaced or sustained,S1 and S2 within normal  limits, no S3, no S4, no clicks, no rubs, no murmurs ABD:  Flat, positive bowel sounds normal in frequency in pitch, no bruits, no rebound, no guarding, no midline pulsatile mass, no hepatomegaly, no splenomegaly EXT:  2 plus pulses throughout, no edema, no cyanosis no clubbing NEURO:  Non focal.     ASSESSMENT AND PLAN  PVCs:   These are about 3% by a Holter previously. She's not particularly symptomatic with them at this point. No change in therapy is indicated.  ATRIAL FIB:   Ms. [name] has a CHA2DS2 - VASc score of 6 with a risk of stroke of 9.8%.   Of note she was in sinus at  the time of her last hospitalization.    HTN:  The blood pressure is at target. No change in medications is indicated. We will continue with therapeutic lifestyle changes (TLC).   Hospital records reviewed for recent hospitalization.

## 2015-06-11 DIAGNOSIS — D0472 Carcinoma in situ of skin of left lower limb, including hip: Secondary | ICD-10-CM | POA: Diagnosis not present

## 2015-06-11 DIAGNOSIS — C44722 Squamous cell carcinoma of skin of right lower limb, including hip: Secondary | ICD-10-CM | POA: Diagnosis not present

## 2015-06-11 DIAGNOSIS — C44629 Squamous cell carcinoma of skin of left upper limb, including shoulder: Secondary | ICD-10-CM | POA: Diagnosis not present

## 2015-06-17 ENCOUNTER — Other Ambulatory Visit: Payer: Self-pay | Admitting: Family Medicine

## 2015-06-23 ENCOUNTER — Other Ambulatory Visit: Payer: Self-pay | Admitting: Family Medicine

## 2015-06-26 ENCOUNTER — Telehealth: Payer: Self-pay | Admitting: Pharmacist

## 2015-06-26 NOTE — Telephone Encounter (Signed)
Patient called to remind that INR due.  She c/o foot pain also and wanted to has appt with provide.  Appt made with Thomasene Mohair, [facility] for 06/30/15 to check foot and INR

## 2015-06-30 ENCOUNTER — Ambulatory Visit: Payer: Self-pay | Admitting: Physician Assistant

## 2015-07-02 ENCOUNTER — Ambulatory Visit: Payer: Medicare Other | Admitting: Cardiology

## 2015-07-03 ENCOUNTER — Ambulatory Visit: Payer: Self-pay | Admitting: Pharmacist

## 2015-07-04 ENCOUNTER — Other Ambulatory Visit: Payer: Medicare Other

## 2015-07-04 DIAGNOSIS — I10 Essential (primary) hypertension: Secondary | ICD-10-CM | POA: Diagnosis not present

## 2015-07-04 DIAGNOSIS — E871 Hypo-osmolality and hyponatremia: Secondary | ICD-10-CM | POA: Diagnosis not present

## 2015-07-04 DIAGNOSIS — Z79899 Other long term (current) drug therapy: Secondary | ICD-10-CM | POA: Diagnosis not present

## 2015-07-07 ENCOUNTER — Other Ambulatory Visit: Payer: Self-pay | Admitting: *Deleted

## 2015-07-07 ENCOUNTER — Encounter (INDEPENDENT_AMBULATORY_CARE_PROVIDER_SITE_OTHER): Payer: Self-pay | Admitting: Internal Medicine

## 2015-07-07 ENCOUNTER — Ambulatory Visit (INDEPENDENT_AMBULATORY_CARE_PROVIDER_SITE_OTHER): Payer: Medicare Other | Admitting: Internal Medicine

## 2015-07-07 VITALS — BP 140/58 | HR 60 | Temp 97.6°F | Ht 63.0 in | Wt 139.7 lb

## 2015-07-07 DIAGNOSIS — A0472 Enterocolitis due to Clostridium difficile, not specified as recurrent: Secondary | ICD-10-CM

## 2015-07-07 DIAGNOSIS — A09 Infectious gastroenteritis and colitis, unspecified: Secondary | ICD-10-CM | POA: Diagnosis not present

## 2015-07-07 DIAGNOSIS — R197 Diarrhea, unspecified: Secondary | ICD-10-CM

## 2015-07-07 DIAGNOSIS — K5909 Other constipation: Secondary | ICD-10-CM

## 2015-07-07 DIAGNOSIS — A047 Enterocolitis due to Clostridium difficile: Secondary | ICD-10-CM

## 2015-07-07 NOTE — Patient Instructions (Addendum)
OV 1 year. Stool softener daily.

## 2015-07-07 NOTE — Progress Notes (Signed)
Subjective:    Patient ID: [name], female    DOB: 10-05-1932, 82 y.o.   MRN: [id number]   HPI Here today for f/u of her diarrhea. Hx of C-diff in the past. Underwent a cholecystectomy in January by Dr. Rosendo Gros for biliary colic. C difficile was positive in hospital. She was covered with Vancomycin. Her C-diff by me was negative in April was negative.  She has been taking IBGARD which has helped her. She is having one a day and someday's she skips a day.  Sometimes she has to disimpact herself. Appetite is good. She has gained 2.7 pounds since her last visit. She is not exercising.      Her last colonoscopy was in 2012 yrs ago by Dr. Delfin Edis which revealed internal hemorrhoids, moderate diverticulosis, sessile polyp in the sigmoid colon.  No family hx of colon cancer.   No NSAID'S. except for a Baby ASA occasionally  Hx significant for atrial fib and maintained on Warfarin.   Colonoscopy Dr. Maurene Capes 2 /06/2010: internal hemorrhoids, moderate diverticulosis, sessile polyp in the sigmoid colon. Biopsy:Benign small bowel mucosa. No villous atrophy, inflammation or other abnormalities present.   01/13/2015 EGD: epigastric pain, nausea and vomiting: Dr. Lajuan Lines. Pyrtle. The mucosa of the esophagus appeared normal. $cm hiatal hernia. The mucosa of the stomach appeared normal. The duodenal mucosa showed no abnormalities in the bulb and 2nd part of the duodenum.  CBC    Component Value Date/Time   WBC 6.8 05/02/2015 1345   WBC 9.9 04/21/2015 1026   RBC 4.09 05/02/2015 1345   RBC 4.35 04/21/2015 1026   HGB 15.3* 04/21/2015 1027   HCT 37.6 05/02/2015 1345   HCT 45.0 04/21/2015 1027   PLT 294 05/02/2015 1345   PLT 312 04/21/2015 1026   MCV 92 05/02/2015 1345   MCV 91.7 04/21/2015 1026   MCH 30.8 05/02/2015 1345   MCH 31.3 04/21/2015 1026   MCHC 33.5 05/02/2015 1345   MCHC 34.1 04/21/2015 1026   RDW 14.1 05/02/2015 1345   RDW 14.2 04/21/2015 1026   LYMPHSABS 1.3  05/02/2015 1345   LYMPHSABS 1.3 04/21/2015 1026   MONOABS 0.5 04/21/2015 1026   EOSABS 0.0 05/02/2015 1345   EOSABS 0.0 04/21/2015 1026   BASOSABS 0.0 05/02/2015 1345   BASOSABS 0.0 04/21/2015 1026      Review of Systems Past Medical History  Diagnosis Date  . Atrial fibrillation (HCC)     Paroxysmal, Flecainide therapy  . Warfarin anticoagulation   . Hypothyroidism   . Carotid bruit     Doppler, December, 2009, no abnormality  . Cardiomegaly     EF normal, echo, 2003  //   EF 60-65%, echo, June 18, 123456, diastolic dysfunction,  . Normal nuclear stress test     Normal, 2001  . HLD (hyperlipidemia)   . HTN (hypertension)   . Osteoarthritis   . Calf pain     September, 2012, at rest  . PONV (postoperative nausea and vomiting)   . GERD (gastroesophageal reflux disease)   . Anemia   . Rectal bleeding 2001    diverticulosis and int hemorrhoids on 07/1999 and 02/2010 colonoscopies.  . Hypothyroidism   . Cataract   . Stroke ([place])   . Insomnia   . History of shingles     Past Surgical History  Procedure Laterality Date  . Total knee arthroplasty  05/26/10    right  . Total shoulder replacement  01/2010    left  .  Shoulder open rotator cuff repair      bilateral  . Total knee arthroplasty  11/17/2010    Procedure: TOTAL KNEE ARTHROPLASTY;  Surgeon: Mauri Pole;  [facility]: [facility];  Service: Orthopedics;  Laterality: Left;  . Total knee arthroplasty      Right  . Cataract extraction w/phaco Right 04/15/2014    Procedure: CATARACT EXTRACTION PHACO AND INTRAOCULAR LENS PLACEMENT (IOC);  Surgeon: Tonny Branch, MD;  Location: [facility];  Service: Ophthalmology;  Laterality: Right;  CDE: 13.30  . Cataract extraction w/phaco Left 05/13/2014    Procedure: CATARACT EXTRACTION PHACO AND INTRAOCULAR LENS PLACEMENT LEFT EYE;  Surgeon: Tonny Branch, MD;  Location: [facility];  Service: Ophthalmology;  Laterality: Left;  CDE:13.00  . Esophagogastroduodenoscopy N/A 01/13/2015    Procedure:  ESOPHAGOGASTRODUODENOSCOPY (EGD);  Surgeon: Jerene Bears, MD;  Location: [facility];  Service: Endoscopy;  Laterality: N/A;  . Cholecystectomy N/A 01/15/2015    Procedure: LAPAROSCOPIC CHOLECYSTECTOMY;  Surgeon: Ralene Ok, MD;  Location: [facility];  Service: General;  Laterality: N/A;    Allergies  Allergen Reactions  . Penicillins Rash    Has patient had a PCN reaction causing immediate rash, facial/tongue/throat swelling, SOB or lightheadedness with hypotension: Yes Has patient had a PCN reaction causing severe rash involving mucus membranes or skin necrosis: No Has patient had a PCN reaction that required hospitalization No Has patient had a PCN reaction occurring within the last 10 years: No If all of the above answers are "NO", then may proceed with Cephalosporin use.    Current Outpatient Prescriptions on File Prior to Visit  Medication Sig Dispense Refill  . atenolol (TENORMIN) 25 MG tablet Take 25 mg by mouth daily. Reported on 01/27/2015    . atorvastatin (LIPITOR) 40 MG tablet Take 1 tablet (40 mg total) by mouth daily at 6 PM. 90 tablet 0  . flecainide (TAMBOCOR) 100 MG tablet Take 1 tablet (100 mg total) by mouth 2 (two) times daily. 60 tablet 0  . furosemide (LASIX) 40 MG tablet Take 0.5-1 tablets (20-40 mg total) by mouth daily. Take 1/2 tablet (20 mg) PO every morning, take 1 tablet (40 mg) PO if swelling occurs. (Patient taking differently: Take 40 mg by mouth daily. ) 90 tablet 1  . gabapentin (NEURONTIN) 100 MG capsule Take 1 capsule by mouth in  the morning 90 capsule 0  . gabapentin (NEURONTIN) 300 MG capsule Take 300 mg by mouth daily as needed.     Marland Kitchen GARLIC PO Take 1 tablet by mouth daily after supper.     . levothyroxine (SYNTHROID, LEVOTHROID) 50 MCG tablet Take 50 mcg by mouth daily before breakfast.     . lisinopril (PRINIVIL,ZESTRIL) 10 MG tablet Take 1 tablet by mouth  daily 60 tablet 4  . oxybutynin (DITROPAN) 5 MG tablet Take 1 tablet (5 mg total) by mouth 3  (three) times daily. (Patient taking differently: Take 5 mg by mouth as needed for bladder spasms. ) 90 tablet 3  . potassium chloride SA (K-DUR,KLOR-CON) 20 MEQ tablet Take 20 mEq by mouth every morning. Take with food    . vitamin B-12 (CYANOCOBALAMIN) 500 MCG tablet Take 500 mcg by mouth every Monday, Wednesday, and Friday.     . warfarin (COUMADIN) 5 MG tablet Take 5 mg by mouth every morning.     . zolpidem (AMBIEN) 10 MG tablet TAKE 1/2 TO 1 TABLET AT BEDTIME AS NEEDED FOR INSOMNIA 15 tablet 1  . ergocalciferol (VITAMIN D2) 50000 UNITS capsule  Take 50,000 Units by mouth every Sunday. Reported on 07/07/2015     No current facility-administered medications on file prior to visit.        Objective:   Physical Exam Blood pressure 140/58, pulse 60, temperature 97.6 F (36.4 C), height 5\' 3"  (1.6 m), weight 139 lb 11.2 oz (63.368 kg).  Alert and oriented. Skin warm and dry. Oral mucosa is moist.   . Sclera anicteric, conjunctivae is pink. Thyroid not enlarged. No cervical lymphadenopathy. Lungs clear. Heart regular rate and rhythm.  Abdomen is soft. Bowel sounds are positive. No hepatomegaly. No abdominal masses felt. No tenderness.  No edema to lower extremities.         Assessment & Plan:  Diarrhea, C-diff which has now resolved.  OV in 1 year.  Constipation: May use stool softener daily.

## 2015-07-09 ENCOUNTER — Other Ambulatory Visit: Payer: Medicare Other

## 2015-07-09 DIAGNOSIS — I10 Essential (primary) hypertension: Secondary | ICD-10-CM | POA: Diagnosis not present

## 2015-07-09 DIAGNOSIS — Z79899 Other long term (current) drug therapy: Secondary | ICD-10-CM | POA: Diagnosis not present

## 2015-07-09 DIAGNOSIS — E871 Hypo-osmolality and hyponatremia: Secondary | ICD-10-CM | POA: Diagnosis not present

## 2015-07-10 MED ORDER — DICYCLOMINE HCL 10 MG PO CAPS: 10.0000 mg | ORAL_CAPSULE | Freq: Three times a day (TID) | ORAL | Status: DC

## 2015-07-10 MED ORDER — MECLIZINE HCL 25 MG PO TABS: 25.0000 mg | ORAL_TABLET | Freq: Three times a day (TID) | ORAL | Status: DC | PRN

## 2015-07-14 DIAGNOSIS — G3184 Mild cognitive impairment, so stated: Secondary | ICD-10-CM | POA: Diagnosis not present

## 2015-07-14 DIAGNOSIS — I6990 Unspecified sequelae of unspecified cerebrovascular disease: Secondary | ICD-10-CM | POA: Diagnosis not present

## 2015-07-15 ENCOUNTER — Ambulatory Visit (INDEPENDENT_AMBULATORY_CARE_PROVIDER_SITE_OTHER): Payer: Medicare Other | Admitting: Nurse Practitioner

## 2015-07-15 ENCOUNTER — Encounter: Payer: Self-pay | Admitting: Nurse Practitioner

## 2015-07-15 VITALS — BP 121/58 | HR 53 | Temp 97.5°F | Ht 63.0 in | Wt 140.0 lb

## 2015-07-15 DIAGNOSIS — E871 Hypo-osmolality and hyponatremia: Secondary | ICD-10-CM | POA: Diagnosis not present

## 2015-07-15 DIAGNOSIS — I639 Cerebral infarction, unspecified: Secondary | ICD-10-CM | POA: Diagnosis not present

## 2015-07-15 DIAGNOSIS — R3981 Functional urinary incontinence: Secondary | ICD-10-CM | POA: Insufficient documentation

## 2015-07-15 DIAGNOSIS — R300 Dysuria: Secondary | ICD-10-CM | POA: Diagnosis not present

## 2015-07-15 DIAGNOSIS — I10 Essential (primary) hypertension: Secondary | ICD-10-CM | POA: Diagnosis not present

## 2015-07-15 DIAGNOSIS — R350 Frequency of micturition: Secondary | ICD-10-CM | POA: Diagnosis not present

## 2015-07-15 DIAGNOSIS — K589 Irritable bowel syndrome without diarrhea: Secondary | ICD-10-CM | POA: Diagnosis not present

## 2015-07-15 DIAGNOSIS — I6340 Cerebral infarction due to embolism of unspecified cerebral artery: Secondary | ICD-10-CM | POA: Insufficient documentation

## 2015-07-15 LAB — MICROSCOPIC EXAMINATION: WBC, UA: NONE SEEN /hpf (ref 0–?)

## 2015-07-15 LAB — URINALYSIS, COMPLETE
Bilirubin, UA: NEGATIVE
Glucose, UA: NEGATIVE
Ketones, UA: NEGATIVE
LEUKOCYTES UA: NEGATIVE
Nitrite, UA: NEGATIVE
PH UA: 6.5 (ref 5.0–7.5)
Protein, UA: NEGATIVE
SPEC GRAV UA: 1.01 (ref 1.005–1.030)
Urobilinogen, Ur: 0.2 mg/dL (ref 0.2–1.0)

## 2015-07-15 NOTE — Progress Notes (Signed)
  Subjective:    [name] is a 82 y.o. female who complains of frequency and urgency. She has had symptoms for 2 days. Patient also complains of urine hesitancy. Patient denies back pain and vaginal discharge. Patient does not have a history of recurrent UTI. Patient does not have a history of pyelonephritis.   The following portions of the patient's history were reviewed and updated as appropriate: allergies, current medications, past family history, past medical history, past social history, past surgical history and problem list.  Review of Systems Pertinent items are noted in HPI.    Objective:    BP 121/58 mmHg  Pulse 53  Temp(Src) 97.5 F (36.4 C) (Oral)  Ht 5\' 3"  (1.6 m)  Wt 140 lb (63.504 kg)  BMI 24.81 kg/m2 General appearance: alert and cooperative Lungs: clear to auscultation bilaterally Heart: regular rate and rhythm, S1, S2 normal, no murmur, click, rub or gallop Abdomen: soft, non-tender; bowel sounds normal; no masses,  no organomegaly  Laboratory:  Urine dipstick: negative for all components.   Micro exam: negative for WBCs or RBCs.    Assessment:     urinary urgency     Plan:   Take oxybutin as rx RTO prn  Mary-Margaret Hassell Done, FNP

## 2015-07-15 NOTE — Patient Instructions (Signed)
Urinary Frequency °The number of times a normal person urinates depends upon how much liquid they take in and how much liquid they are losing. If the temperature is hot and there is high humidity, then the person will sweat more and usually breathe a little more frequently. These factors decrease the amount of frequency of urination that would be considered normal. °The amount you drink is easily determined, but the amount of fluid lost is sometimes more difficult to calculate.  °Fluid is lost in two ways: °· Sensible fluid loss is usually measured by the amount of urine that you get rid of. Losses of fluid can also occur with diarrhea. °· Insensible fluid loss is more difficult to measure. It is caused by evaporation. Insensible loss of fluid occurs through breathing and sweating. It usually ranges from a little less than a quart to a little more than a quart of fluid a day. °In normal temperatures and activity levels, the average person may urinate 4 to 7 times in a 24-hour period. Needing to urinate more often than that could indicate a problem. If one urinates 4 to 7 times in 24 hours and has large volumes each time, that could indicate a different problem from one who urinates 4 to 7 times a day and has small volumes. The time of urinating is also important. Most urinating should be done during the waking hours. Getting up at night to urinate frequently can indicate some problems. °CAUSES  °The bladder is the organ in your lower abdomen that holds urine. Like a balloon, it swells some as it fills up. Your nerves sense this and tell you it is time to head for the bathroom. There are a number of reasons that you might feel the need to urinate more often than usual. They include: °· Urinary tract infection. This is usually associated with other signs such as burning when you urinate. °· In men, problems with the prostate (a walnut-size gland that is located near the tube that carries urine out of your body). There  are two reasons why the prostate can cause an increased frequency of urination: °¨ An enlarged prostate that does not let the bladder empty well. If the bladder only half empties when you urinate, then it only has half the capacity to fill before you have to urinate again. °¨ The nerves in the bladder become more hypersensitive with an increased size of the prostate even if the bladder empties completely. °· Pregnancy. °· Obesity. Excess weight is more likely to cause a problem for women than for men. °· Bladder stones or other bladder problems. °· Caffeine. °· Alcohol. °· Medications. For example, drugs that help the body get rid of extra fluid (diuretics) increase urine production. Some other medicines must be taken with lots of fluids. °· Muscle or nerve weakness. This might be the result of a spinal cord injury, a stroke, multiple sclerosis, or Parkinson disease. °· Long-standing diabetes can decrease the sensation of the bladder. This loss of sensation makes it harder to sense the bladder needs to be emptied. Over a period of years, the bladder is stretched out by constant overfilling. This weakens the bladder muscles so that the bladder does not empty well and has less capacity to fill with new urine. °· Interstitial cystitis (also called painful bladder syndrome). This condition develops because the tissues that line the inside of the bladder are inflamed (inflammation is the body's way of reacting to injury or infection). It causes pain and frequent   urination. It occurs in women more often than in men. °DIAGNOSIS  °· To decide what might be causing your urinary frequency, your health care provider will probably: °¨ Ask about symptoms you have noticed. °¨ Ask about your overall health. This will include questions about any medications you are taking. °¨ Do a physical examination. °· Order some tests. These might include: °¨ A blood test to check for diabetes or other health issues that could be contributing  to the problem. °¨ Urine testing. This could measure the flow of urine and the pressure on the bladder. °¨ A test of your neurological system (the brain, spinal cord, and nerves). This is the system that senses the need to urinate. °¨ A bladder test to check whether it is emptying completely when you urinate. °¨ Cystoscopy. This test uses a thin tube with a tiny camera on it. It offers a look inside your urethra and bladder to see if there are problems. °¨ Imaging tests. You might be given a contrast dye and then asked to urinate. X-rays are taken to see how your bladder is working. °TREATMENT  °It is important for you to be evaluated to determine if the amount or frequency that you have is unusual or abnormal. If it is found to be abnormal, the cause should be determined and this can usually be found out easily. Depending upon the cause, treatment could include medication, stimulation of the nerves, or surgery. °There are not too many things that you can do as an individual to change your urinary frequency. It is important that you balance the amount of fluid intake needed to compensate for your activity and the temperature. Medical problems will be diagnosed and taken care of by your physician. There is no particular bladder training such as Kegel exercises that you can do to help urinary frequency. This is an exercise that is usually recommended for people who have leaking of urine when they laugh, cough, or sneeze. °HOME CARE INSTRUCTIONS  °· Take any medications your health care provider prescribed or suggested. Follow the directions carefully. °· Practice any lifestyle changes that are recommended. These might include: °¨ Drinking less fluid or drinking at different times of the day. If you need to urinate often during the night, for example, you may need to stop drinking fluids early in the evening. °¨ Cutting down on caffeine or alcohol. They both can make you need to urinate more often than normal. Caffeine  is found in coffee, tea, and sodas. °¨ Losing weight, if that is recommended. °· Keep a journal or a log. You might be asked to record how much you drink and when and where you feel the need to urinate. This will also help evaluate how well the treatment provided by your physician is working. °SEEK MEDICAL CARE IF:  °· Your need to urinate often gets worse. °· You feel increased pain or irritation when you urinate. °· You notice blood in your urine. °· You have questions about any medications that your health care provider recommended. °· You notice blood, pus, or swelling at the site of any test or treatment procedure. °· You develop a fever of more than 100.5°F (38.1°C). °SEEK IMMEDIATE MEDICAL CARE IF:  °You develop a fever of more than 102.0°F (38.9°C). °  °This information is not intended to replace advice given to you by your health care provider. Make sure you discuss any questions you have with your health care provider. °  °Document Released: 10/17/2008 Document Revised:   01/11/2014 Document Reviewed: 10/17/2008 °Elsevier Interactive Patient Education ©2016 [employer] ° °

## 2015-07-21 ENCOUNTER — Other Ambulatory Visit: Payer: Self-pay | Admitting: *Deleted

## 2015-07-21 DIAGNOSIS — H353131 Nonexudative age-related macular degeneration, bilateral, early dry stage: Secondary | ICD-10-CM | POA: Diagnosis not present

## 2015-07-21 DIAGNOSIS — H5310 Unspecified subjective visual disturbances: Secondary | ICD-10-CM | POA: Diagnosis not present

## 2015-07-21 DIAGNOSIS — Z961 Presence of intraocular lens: Secondary | ICD-10-CM | POA: Diagnosis not present

## 2015-07-21 MED ORDER — GABAPENTIN 300 MG PO CAPS: 300.0000 mg | ORAL_CAPSULE | Freq: Three times a day (TID) | ORAL | Status: DC

## 2015-07-23 ENCOUNTER — Telehealth: Payer: Self-pay | Admitting: Pharmacist

## 2015-07-23 NOTE — Telephone Encounter (Signed)
INR recheck needed - patient called and appt made for 07/28/15 at noon.

## 2015-07-28 ENCOUNTER — Encounter: Payer: Self-pay | Admitting: Pharmacist

## 2015-07-28 ENCOUNTER — Ambulatory Visit (INDEPENDENT_AMBULATORY_CARE_PROVIDER_SITE_OTHER): Payer: Medicare Other | Admitting: Pharmacist

## 2015-07-28 DIAGNOSIS — I480 Paroxysmal atrial fibrillation: Secondary | ICD-10-CM

## 2015-07-28 LAB — COAGUCHEK XS/INR WAIVED
INR: 2.4 — ABNORMAL HIGH (ref 0.9–1.1)
PROTHROMBIN TIME: 28.4 s

## 2015-07-28 MED ORDER — VITAMIN D 1000 UNITS PO TABS: 1000.0000 [IU] | ORAL_TABLET | Freq: Every day | ORAL | Status: DC

## 2015-07-29 ENCOUNTER — Ambulatory Visit: Payer: Medicare Other | Admitting: Family Medicine

## 2015-07-30 ENCOUNTER — Encounter: Payer: Self-pay | Admitting: Family Medicine

## 2015-07-31 ENCOUNTER — Encounter: Payer: Self-pay | Admitting: Family Medicine

## 2015-07-31 ENCOUNTER — Ambulatory Visit (INDEPENDENT_AMBULATORY_CARE_PROVIDER_SITE_OTHER): Payer: Medicare Other | Admitting: Family Medicine

## 2015-07-31 ENCOUNTER — Ambulatory Visit (INDEPENDENT_AMBULATORY_CARE_PROVIDER_SITE_OTHER): Payer: Medicare Other

## 2015-07-31 VITALS — BP 157/74 | HR 57 | Temp 97.5°F | Ht 63.0 in | Wt 135.0 lb

## 2015-07-31 DIAGNOSIS — R350 Frequency of micturition: Secondary | ICD-10-CM

## 2015-07-31 DIAGNOSIS — R05 Cough: Secondary | ICD-10-CM | POA: Diagnosis not present

## 2015-07-31 DIAGNOSIS — Z79899 Other long term (current) drug therapy: Secondary | ICD-10-CM | POA: Diagnosis not present

## 2015-07-31 DIAGNOSIS — R059 Cough, unspecified: Secondary | ICD-10-CM

## 2015-07-31 DIAGNOSIS — I10 Essential (primary) hypertension: Secondary | ICD-10-CM

## 2015-07-31 DIAGNOSIS — R300 Dysuria: Secondary | ICD-10-CM | POA: Diagnosis not present

## 2015-07-31 LAB — MICROSCOPIC EXAMINATION: Epithelial Cells (non renal): >10 /hpf — ABNORMAL HIGH (ref 0–10)

## 2015-07-31 LAB — URINALYSIS, COMPLETE
BILIRUBIN UA: NEGATIVE
GLUCOSE, UA: NEGATIVE
KETONES UA: NEGATIVE
NITRITE UA: NEGATIVE
PROTEIN UA: NEGATIVE
SPEC GRAV UA: 1.015 (ref 1.005–1.030)
UUROB: 0.2 mg/dL (ref 0.2–1.0)
pH, UA: 7 (ref 5.0–7.5)

## 2015-07-31 MED ORDER — SOLIFENACIN SUCCINATE 5 MG PO TABS: 5.0000 mg | ORAL_TABLET | Freq: Every day | ORAL | 3 refills | Status: DC

## 2015-07-31 NOTE — Progress Notes (Addendum)
HPI  Patient presents today with urinary frequency, cough, complaints about to many pills, and hypertension.  Cough As been gone for about 2 weeks She complains about 2 months of malaise and fatigue. She states that she is breathing okay and she denies any chest pain. She denies any fever, sweats, or difficulty tolerating food or fluids Her appetite is poor for several months. She has had chills intermittently over the last 2 weeks.  Urinary frequency Also with intermittent low back pain, no dysuria. Helped by denture p.m.  Polypharmacy Complains of too many pills. She would like to stop whatever she could.  Hypertension and leg swelling. Leg swelling is easily controlled with Lasix, however she finally understands that this is probably not the best long-term plan. She will try compression stockings.  She's been taking antidiarrheal medicines every day for a few months. She explains that she does not have any loose stools for at least 3 or 4 days after she stops taking them. She's just very nervous about getting diarrhea again.  PMH: Smoking status noted ROS: Per HPI  Objective: BP (!) 157/74   Pulse (!) 57   Temp 97.5 F (36.4 C) (Oral)   Ht 5' 3" (1.6 m)   Wt 135 lb (61.2 kg)   BMI 23.91 kg/m  Gen: NAD, alert, cooperative with exam HEENT: NCAT CV: RRR, good S1/S2, no murmur Resp: CTABL, no wheezes, non-labored Abd: SNTND, BS present, no guarding or organomegaly Ext: No edema, warm Neuro: Alert and oriented, No gross deficits   CXR- No acute findings, L shoulder replacement  Assessment and plan:  # Hypertension Elevated today She is on atenolol and lisinopril. Given her cough is acute I don't believe it's ACE inhibitor related. Her blood pressure is normally well controlled, will return in one month for reevaluation. I would like to change her from Lasix to HCTZ which can also reduce the number of pills she is taking, we can also stop potassium that as  well.  # Cough Mild, intermittent, over the last 2 weeks. Chest x-ray to rule out pneumonia, otherwise does not appear to be a bacterial infection Supportive care  # Overactive bladder Urinary frequency helped by denture pain, however compliance is difficult with 3 times a day dosing Change to vesicare, no Myrbetriq due to flecanide  Polypharmacy,  Trying to reduce meds overall, starting compression stockings for venous stasis, then discontinue Lasix and start HCTZ Reduce over-the-counter antidiarrheal medications, she does have a history of C. difficile, however she has no severe abdominal pain or diarrhea currently.    Orders Placed This Encounter  Procedures  . DG Chest 2 View    Standing Status:   Future    Standing Expiration Date:   09/30/2016    Order Specific Question:   Reason for Exam (SYMPTOM  OR DIAGNOSIS REQUIRED)    Answer:   cough, eval for CAP    Order Specific Question:   Preferred imaging location?    Answer:   External  . Urinalysis, Complete  . CMP14+EGFR  . CBC with Differential    Meds ordered this encounter  Medications  . solifenacin (VESICARE) 5 MG tablet    Sig: Take 1 tablet (5 mg total) by mouth daily.    Dispense:  30 tablet    Refill:  [place], MD [facility] 07/31/2015, 9:58 AM

## 2015-07-31 NOTE — Patient Instructions (Addendum)
Great to see you!  Stop ditropan, start vesicare  Try to slow down on your anti-diarrheal medicine

## 2015-08-01 LAB — CBC WITH DIFFERENTIAL/PLATELET
BASOS ABS: 0 10*3/uL (ref 0.0–0.2)
Basos: 1 %
EOS (ABSOLUTE): 0 10*3/uL (ref 0.0–0.4)
Eos: 1 %
HEMOGLOBIN: 13.1 g/dL (ref 11.1–15.9)
Hematocrit: 39.6 % (ref 34.0–46.6)
IMMATURE GRANS (ABS): 0 10*3/uL (ref 0.0–0.1)
Immature Granulocytes: 0 %
LYMPHS: 16 %
Lymphocytes Absolute: 1.3 10*3/uL (ref 0.7–3.1)
MCH: 31.6 pg (ref 26.6–33.0)
MCHC: 33.1 g/dL (ref 31.5–35.7)
MCV: 96 fL (ref 79–97)
MONOCYTES: 11 %
Monocytes Absolute: 0.8 10*3/uL (ref 0.1–0.9)
NEUTROS PCT: 71 %
Neutrophils Absolute: 5.5 10*3/uL (ref 1.4–7.0)
PLATELETS: 311 10*3/uL (ref 150–379)
RBC: 4.14 x10E6/uL (ref 3.77–5.28)
RDW: 14.2 % (ref 12.3–15.4)
WBC: 7.7 10*3/uL (ref 3.4–10.8)

## 2015-08-01 LAB — CMP14+EGFR
ALK PHOS: 54 IU/L (ref 39–117)
ALT: 8 IU/L (ref 0–32)
AST: 16 IU/L (ref 0–40)
Albumin/Globulin Ratio: 1.6 (ref 1.2–2.2)
Albumin: 4.3 g/dL (ref 3.5–4.7)
BILIRUBIN TOTAL: 0.5 mg/dL (ref 0.0–1.2)
BUN/Creatinine Ratio: 12 (ref 12–28)
BUN: 8 mg/dL (ref 8–27)
CHLORIDE: 91 mmol/L — AB (ref 96–106)
CO2: 21 mmol/L (ref 18–29)
Calcium: 8.8 mg/dL (ref 8.7–10.3)
Creatinine, Ser: 0.69 mg/dL (ref 0.57–1.00)
GFR calc Af Amer: 94 mL/min/{1.73_m2} (ref >59)
GFR calc non Af Amer: 82 mL/min/{1.73_m2} (ref >59)
GLUCOSE: 108 mg/dL — AB (ref 65–99)
Globulin, Total: 2.7 g/dL (ref 1.5–4.5)
Potassium: 4.4 mmol/L (ref 3.5–5.2)
Sodium: 131 mmol/L — ABNORMAL LOW (ref 134–144)
TOTAL PROTEIN: 7 g/dL (ref 6.0–8.5)

## 2015-08-04 ENCOUNTER — Other Ambulatory Visit: Payer: Self-pay | Admitting: Family Medicine

## 2015-08-05 NOTE — Telephone Encounter (Signed)
Refill called to [facility] 

## 2015-08-15 DIAGNOSIS — N3944 Nocturnal enuresis: Secondary | ICD-10-CM | POA: Diagnosis not present

## 2015-08-15 DIAGNOSIS — R351 Nocturia: Secondary | ICD-10-CM | POA: Diagnosis not present

## 2015-08-15 DIAGNOSIS — N3946 Mixed incontinence: Secondary | ICD-10-CM | POA: Diagnosis not present

## 2015-08-15 DIAGNOSIS — R350 Frequency of micturition: Secondary | ICD-10-CM | POA: Diagnosis not present

## 2015-08-19 ENCOUNTER — Other Ambulatory Visit: Payer: Self-pay | Admitting: Family Medicine

## 2015-08-21 ENCOUNTER — Encounter: Payer: Self-pay | Admitting: Family Medicine

## 2015-08-21 ENCOUNTER — Ambulatory Visit (INDEPENDENT_AMBULATORY_CARE_PROVIDER_SITE_OTHER): Payer: Medicare Other

## 2015-08-21 ENCOUNTER — Ambulatory Visit (INDEPENDENT_AMBULATORY_CARE_PROVIDER_SITE_OTHER): Payer: Medicare Other | Admitting: Family Medicine

## 2015-08-21 ENCOUNTER — Other Ambulatory Visit: Payer: Self-pay | Admitting: *Deleted

## 2015-08-21 VITALS — BP 130/69 | HR 61 | Temp 97.1°F | Ht 63.0 in | Wt 136.2 lb

## 2015-08-21 DIAGNOSIS — L97511 Non-pressure chronic ulcer of other part of right foot limited to breakdown of skin: Secondary | ICD-10-CM

## 2015-08-21 DIAGNOSIS — J209 Acute bronchitis, unspecified: Secondary | ICD-10-CM | POA: Diagnosis not present

## 2015-08-21 DIAGNOSIS — Z0000 Encounter for general adult medical examination without abnormal findings: Secondary | ICD-10-CM

## 2015-08-21 MED ORDER — DOXYCYCLINE HYCLATE 100 MG PO TABS: 100.0000 mg | ORAL_TABLET | Freq: Two times a day (BID) | ORAL | 0 refills | Status: DC

## 2015-08-21 MED ORDER — DOXYCYCLINE HYCLATE 100 MG PO TABS: 100.0000 mg | ORAL_TABLET | Freq: Two times a day (BID) | ORAL | Status: DC

## 2015-08-21 NOTE — Patient Instructions (Addendum)
Great to see you!  I have sent antibiotics for your cough and to ensure your toe has some treatment as well. We will cal with x ray resuilts

## 2015-08-21 NOTE — Progress Notes (Signed)
Subjective:    [name] is a 82 y.o. female who presents for Medicare Annual/Subsequent preventive examination.  Preventive Screening-Counseling & Management  Tobacco History  Smoking Status  . Never Smoker  Smokeless Tobacco  . Never Used     Problems Prior to Visit 1. See below  Current Problems (verified)  Cough:  3-4 weeks or progressive non productive cough, + night sweats occasionally , no fevers or chills. She has had malaise.  Eating and drinking ok   Toe:  6 + months, eval with XR on 11/16 with cellulitis Stated at that time it was present for more than 2 years   Patient Active Problem List   Diagnosis Date Noted  . Overactive bladder 05/02/2015  . Acute cerebrovascular accident (CVA) ([place]) 04/21/2015  . Acute cardioembolic stroke ([place]) [id number]  . Healthcare maintenance 02/20/2015  . History of dysarthria 02/20/2015  . Enteritis due to Clostridium difficile 01/27/2015  . HTN (hypertension) 01/11/2015  . Hyponatremia 01/11/2015  . Rectal bleeding 01/11/2015  . Chronic pain syndrome 11/25/2014  . Insomnia 11/25/2014  . Chronic ulcer of toe ([place]) 11/13/2014  . Diastolic dysfunction 0000000  . Paroxysmal atrial fibrillation ([place]) 10/14/2014  . Warfarin anticoagulation   . Hypothyroidism   . Carotid bruit   . Normal nuclear stress test   . S/P knee replacement   . UNSPECIFIED ANEMIA 01/19/2010  . DIVERTICULOSIS-COLON 01/19/2010    Medications Prior to Visit Current Outpatient Prescriptions on File Prior to Visit  Medication Sig Dispense Refill  . atenolol (TENORMIN) 25 MG tablet Take 25 mg by mouth daily. Reported on 01/27/2015    . cholecalciferol (VITAMIN D) 1000 units tablet Take 1 tablet (1,000 Units total) by mouth daily.    . flecainide (TAMBOCOR) 100 MG tablet Take 1 tablet (100 mg total) by mouth 2 (two) times daily. 60 tablet 0  . furosemide (LASIX) 40 MG tablet Take 1/2 tablet by mouth  every morning , take 1  tablet by mouth if  swelling occurs 90 tablet 1  . gabapentin (NEURONTIN) 100 MG capsule Take 1 capsule by mouth in  the morning 90 capsule 0  . GARLIC PO Take 1 tablet by mouth daily after supper.     . levothyroxine (SYNTHROID, LEVOTHROID) 50 MCG tablet Take 50 mcg by mouth daily before breakfast.     . lisinopril (PRINIVIL,ZESTRIL) 10 MG tablet Take 1 tablet by mouth  daily 60 tablet 4  . potassium chloride SA (K-DUR,KLOR-CON) 20 MEQ tablet Take 20 mEq by mouth every morning. Take with food    . solifenacin (VESICARE) 5 MG tablet Take 1 tablet (5 mg total) by mouth daily. 30 tablet 3  . vitamin B-12 (CYANOCOBALAMIN) 500 MCG tablet Take 500 mcg by mouth every Monday, Wednesday, and Friday.     . warfarin (COUMADIN) 5 MG tablet Take 5 mg by mouth every morning.     . zolpidem (AMBIEN) 10 MG tablet TAKE 1/2 TO 1 TABLET AT BEDTIME AS NEEDED FOR INSOMNIA 15 tablet 0  . dicyclomine (BENTYL) 10 MG capsule Take 1 capsule (10 mg total) by mouth 4 (four) times daily -  before meals and at bedtime. (Patient not taking: Reported on 07/28/2015) 90 capsule 0  . meclizine (ANTIVERT) 25 MG tablet Take 1 tablet (25 mg total) by mouth 3 (three) times daily as needed for dizziness. (Patient not taking: Reported on 07/28/2015) 90 tablet 0   No current facility-administered medications on file prior to visit.     Current  Medications (verified) Current Outpatient Prescriptions  Medication Sig Dispense Refill  . atenolol (TENORMIN) 25 MG tablet Take 25 mg by mouth daily. Reported on 01/27/2015    . cholecalciferol (VITAMIN D) 1000 units tablet Take 1 tablet (1,000 Units total) by mouth daily.    . flecainide (TAMBOCOR) 100 MG tablet Take 1 tablet (100 mg total) by mouth 2 (two) times daily. 60 tablet 0  . furosemide (LASIX) 40 MG tablet Take 1/2 tablet by mouth  every morning , take 1  tablet by mouth if swelling occurs 90 tablet 1  . gabapentin (NEURONTIN) 100 MG capsule Take 1 capsule by mouth in  the morning 90 capsule 0  . GARLIC  PO Take 1 tablet by mouth daily after supper.     . levothyroxine (SYNTHROID, LEVOTHROID) 50 MCG tablet Take 50 mcg by mouth daily before breakfast.     . lisinopril (PRINIVIL,ZESTRIL) 10 MG tablet Take 1 tablet by mouth  daily 60 tablet 4  . potassium chloride SA (K-DUR,KLOR-CON) 20 MEQ tablet Take 20 mEq by mouth every morning. Take with food    . solifenacin (VESICARE) 5 MG tablet Take 1 tablet (5 mg total) by mouth daily. 30 tablet 3  . vitamin B-12 (CYANOCOBALAMIN) 500 MCG tablet Take 500 mcg by mouth every Monday, Wednesday, and Friday.     . warfarin (COUMADIN) 5 MG tablet Take 5 mg by mouth every morning.     . zolpidem (AMBIEN) 10 MG tablet TAKE 1/2 TO 1 TABLET AT BEDTIME AS NEEDED FOR INSOMNIA 15 tablet 0  . dicyclomine (BENTYL) 10 MG capsule Take 1 capsule (10 mg total) by mouth 4 (four) times daily -  before meals and at bedtime. (Patient not taking: Reported on 07/28/2015) 90 capsule 0  . meclizine (ANTIVERT) 25 MG tablet Take 1 tablet (25 mg total) by mouth 3 (three) times daily as needed for dizziness. (Patient not taking: Reported on 07/28/2015) 90 tablet 0   No current facility-administered medications for this visit.      Allergies (verified) Penicillins   PAST HISTORY  Family History Family History  Problem Relation Age of Onset  . COPD Brother   . Atrial fibrillation Brother   . Diabetes Brother   . Liver cancer Brother     Alcohol-related  . Colon cancer Brother   . Heart attack Father   . Rheum arthritis Mother   . Arthritis Mother   . Heart disease Mother   . Coronary artery disease Brother   . Heart attack Daughter   . Gallbladder disease Brother     Social History Social History  Substance Use Topics  . Smoking status: Never Smoker  . Smokeless tobacco: Never Used  . Alcohol use No     Are there smokers in your home (other than you)? No  Risk Factors Current exercise habits: exercises use recumbent bike, 10  minutes daily  Dietary issues  discussed: appetite decreased   Cardiac risk factors: advanced age (older than 32 for men, 27 for women) and hypertension.  Depression Screen (Note: if answer to either of the following is "Yes", a more complete depression screening is indicated)   Over the past two weeks, have you felt down, depressed or hopeless? No  Over the past two weeks, have you felt little interest or pleasure in doing things? Yes  Have you lost interest or pleasure in daily life? Yes  Do you often feel hopeless? No  Do you cry easily over simple problems? No  Activities of Daily Living In your present state of health, do you have any difficulty performing the following activities?:  Driving? Yes Managing money?  Yes Feeding yourself? Yes Getting from bed to chair? Yes Climbing a flight of stairs? Yes , has leg weakness Preparing food and eating?: No Bathing or showering? No Getting dressed: No Getting to the toilet? No Using the toilet:No Moving around from place to place: No In the past year have you fallen or had a near fall?:No   Are you sexually active?  No  Do you have more than one partner?  No  Hearing Difficulties: yes Do you often ask people to speak up or repeat themselves? No Do you experience ringing or noises in your ears? Mild occasionally Do you have difficulty understanding soft or whispered voices? Yes   Do you feel that you have a problem with memory? Yes  Do you often misplace items? No  Do you feel safe at home?  Yes  Cognitive Testing  Alert? Yes  Normal Appearance?Yes  Oriented to person? Yes  Place? Yes   Time? No  Recall of three objects?  Yes  Can perform simple calculations? Yes  Displays appropriate judgment?Yes  Can read the correct time from a watch face?Yes   Advanced Directives have been discussed with the patient? Yes  List the Names of Other Physician/Practitioners you currently use: 1.    Indicate any recent Medical Services you may have received from  other than [facility] providers in the past year (date may be approximate).  Immunization History  Administered Date(s) Administered  . Influenza-Unspecified 10/04/2014  . Pneumococcal Conjugate-13 02/20/2015    Screening Tests Health Maintenance  Topic Date Due  . TETANUS/TDAP  03/03/1951  . ZOSTAVAX  03/03/1994  . INFLUENZA VACCINE  08/05/2015  . PNA vac Low Risk Adult (2 of 2 - PPSV23) 02/20/2016  . DEXA SCAN  12/15/2016    All answers were reviewed with the patient and necessary referrals were made:  Kenn File, MD   08/21/2015   History reviewed: allergies, current medications, past family history, past medical history, past social history, past surgical history and problem list  Review of Systems Pertinent items are noted in HPI.    Objective:      Body mass index is 24.14 kg/m. BP 130/69   Pulse 61   Temp 97.1 F (36.2 C) (Oral)   Ht 5\' 3"  (1.6 m)   Wt 136 lb 4 oz (61.8 kg)   BMI 24.14 kg/m   Gen: NAD, alert, cooperative with exam HEENT: NCAT CV: RRR, good S1/S2, no murmur Resp: CTABL, no wheezes, non-labored Ext: Left second toe with 6 mm x 9 mm roughly circular ulcer on the medial surface, probing of the base does not reveal exposed bone. Neuro: Alert and oriented     Assessment:     Mrs. Sundin is a pleasant 82 year old female here for Medicare annual wellness visit. She has 3-4 week episode of cough consistent with possible atypical pneumonia. She's also struggled with a left second toe ulcer for over a year. She was treated for cellulitis of this toe in November 2016. X-ray was performed then which did not show signs of osteomyelitis. Probing of the base of the wound does not reveal exposed bone today.  X-ray to evaluate for osteomyelitis, consider MRI, also consider referral to orthopedic surgery vs podiatry for recommendations.  Treating cough and toe with doxycycline today. Close follow up with clinical pharmacist for coumadin management.  We  have reviewed her healthcare maintenance and I offered her a tetanus and discussed the shingles shot today. Return to clinic in 2 months for routine follow-up and to follow-up for her     Plan:     During the course of the visit the patient was educated and counseled about appropriate screening and preventive services including:    Advanced directives: Patient to complete advance directives packet  Diet review for nutrition referral? No   Patient Instructions (the written plan) was given to the patient.  Medicare Attestation I have personally reviewed: The patient's medical and social history Their use of alcohol, tobacco or illicit drugs Their current medications and supplements The patient's functional ability including ADLs,fall risks, home safety risks, cognitive, and hearing and visual impairment Diet and physical activities Evidence for depression or mood disorders  The patient's weight, height, BMI, and visual acuity have been recorded in the chart.  I have made referrals, counseling, and provided education to the patient based on review of the above and I have provided the patient with a written personalized care plan for preventive services.     Kenn File, MD   08/21/2015

## 2015-08-22 ENCOUNTER — Telehealth: Payer: Self-pay | Admitting: Family Medicine

## 2015-08-22 ENCOUNTER — Telehealth: Payer: Self-pay

## 2015-08-22 NOTE — Telephone Encounter (Signed)
Pt has an appointment on 08/29/2015 at 2pm with Dr. Doran Durand at [facility] with a female at the residence

## 2015-08-23 ENCOUNTER — Emergency Department (HOSPITAL_COMMUNITY)
Admission: EM | Admit: 2015-08-23 | Discharge: 2015-08-23 | Disposition: A | Discharge status: Home / Self Care | Payer: Medicare Other | Attending: Emergency Medicine | Admitting: Emergency Medicine

## 2015-08-23 ENCOUNTER — Emergency Department (HOSPITAL_COMMUNITY): Payer: Medicare Other

## 2015-08-23 ENCOUNTER — Encounter (HOSPITAL_COMMUNITY): Payer: Self-pay | Admitting: *Deleted

## 2015-08-23 DIAGNOSIS — I4891 Unspecified atrial fibrillation: Secondary | ICD-10-CM | POA: Diagnosis not present

## 2015-08-23 DIAGNOSIS — Z8673 Personal history of transient ischemic attack (TIA), and cerebral infarction without residual deficits: Secondary | ICD-10-CM | POA: Insufficient documentation

## 2015-08-23 DIAGNOSIS — R0781 Pleurodynia: Secondary | ICD-10-CM | POA: Diagnosis not present

## 2015-08-23 DIAGNOSIS — Z7901 Long term (current) use of anticoagulants: Secondary | ICD-10-CM | POA: Insufficient documentation

## 2015-08-23 DIAGNOSIS — W19XXXA Unspecified fall, initial encounter: Secondary | ICD-10-CM

## 2015-08-23 DIAGNOSIS — E039 Hypothyroidism, unspecified: Secondary | ICD-10-CM | POA: Insufficient documentation

## 2015-08-23 DIAGNOSIS — Z79899 Other long term (current) drug therapy: Secondary | ICD-10-CM | POA: Insufficient documentation

## 2015-08-23 DIAGNOSIS — W01198A Fall on same level from slipping, tripping and stumbling with subsequent striking against other object, initial encounter: Secondary | ICD-10-CM | POA: Insufficient documentation

## 2015-08-23 DIAGNOSIS — Y929 Unspecified place or not applicable: Secondary | ICD-10-CM | POA: Diagnosis not present

## 2015-08-23 DIAGNOSIS — I10 Essential (primary) hypertension: Secondary | ICD-10-CM | POA: Diagnosis not present

## 2015-08-23 DIAGNOSIS — Y9301 Activity, walking, marching and hiking: Secondary | ICD-10-CM | POA: Insufficient documentation

## 2015-08-23 DIAGNOSIS — Y999 Unspecified external cause status: Secondary | ICD-10-CM | POA: Insufficient documentation

## 2015-08-23 DIAGNOSIS — S20212A Contusion of left front wall of thorax, initial encounter: Secondary | ICD-10-CM

## 2015-08-23 DIAGNOSIS — R0789 Other chest pain: Secondary | ICD-10-CM | POA: Diagnosis present

## 2015-08-23 MED ORDER — HYDROCODONE-ACETAMINOPHEN 5-325 MG PO TABS: 1.0000 | ORAL_TABLET | ORAL | 0 refills | Status: DC | PRN

## 2015-08-23 MED ORDER — HYDROCODONE-ACETAMINOPHEN 5-325 MG PO TABS
2.0000 | ORAL_TABLET | Freq: Once | ORAL | Status: AC
  Administered 2015-08-23: 2 via ORAL
  Filled 2015-08-23: qty 2

## 2015-08-23 NOTE — ED Triage Notes (Signed)
Pt states she got tripped in the gravel and fell. Pt is unsure of everything that she hit. She has a small abrasion to her right hand, bruising to her left hip, and pain in her left chest. She denies any chest pain before the fall. She states she fell onto her left chest. She is on blood thinner. She states she doesn't think she hit her head but she is unsure. Pt alert and oriented upon triage.

## 2015-08-23 NOTE — ED Provider Notes (Signed)
[facility] DEPT Provider Note   CSN: [id number] Arrival date & time: 08/23/15  1423     History   Chief Complaint Chief Complaint  Patient presents with  . Fall    HPI [name] is a 82 y.o. female presenting after a fall. She was walking with her husband and thinks she tripped and fell. She has chronic balance problems and has been having falls for a couple years. She was walking on uneven gravel and thinks she tripped. She has chronic dizziness but does not feel like she had dizziness today or lightheadedness. Never passed out. Did not hit her head and has no headache. Thinks she may hit multiple areas when she fell but it's the only area of pain is her left anterior chest. Hurts with movement and breathing. No back pain, abdominal pain, or hip pain. It took a while for her husband to help get her back up but once she got back up she was able to walk without difficulty. Took Tylenol with only moderate relief. Injury occurred 2 hours ago.  HPI  Past Medical History:  Diagnosis Date  . Anemia   . Atrial fibrillation (HCC)    Paroxysmal, Flecainide therapy  . Calf pain    September, 2012, at rest  . Cardiomegaly    EF normal, echo, 2003  //   EF 60-65%, echo, June 18, [id number], diastolic dysfunction,  . Carotid bruit    Doppler, December, 2009, no abnormality  . Cataract   . GERD (gastroesophageal reflux disease)   . History of shingles   . HLD (hyperlipidemia)   . HTN (hypertension)   . Hypothyroidism   . Hypothyroidism   . Insomnia   . Normal nuclear stress test    Normal, 2001  . Osteoarthritis   . PONV (postoperative nausea and vomiting)   . Rectal bleeding 2001   diverticulosis and int hemorrhoids on 07/1999 and 02/2010 colonoscopies.  . Stroke ([place])   . Warfarin anticoagulation     Patient Active Problem List   Diagnosis Date Noted  . Overactive bladder 05/02/2015  . Acute cerebrovascular accident (CVA) ([place]) 04/21/2015  . Acute cardioembolic stroke ([place])  [id number]  . Healthcare maintenance 02/20/2015  . History of dysarthria 02/20/2015  . Enteritis due to Clostridium difficile 01/27/2015  . HTN (hypertension) 01/11/2015  . Hyponatremia 01/11/2015  . Rectal bleeding 01/11/2015  . Chronic pain syndrome 11/25/2014  . Insomnia 11/25/2014  . Chronic ulcer of toe ([place]) 11/13/2014  . Diastolic dysfunction 0000000  . Paroxysmal atrial fibrillation ([place]) 10/14/2014  . Warfarin anticoagulation   . Hypothyroidism   . Carotid bruit   . Normal nuclear stress test   . S/P knee replacement   . UNSPECIFIED ANEMIA 01/19/2010  . DIVERTICULOSIS-COLON 01/19/2010    Past Surgical History:  Procedure Laterality Date  . CATARACT EXTRACTION W/PHACO Right 04/15/2014   Procedure: CATARACT EXTRACTION PHACO AND INTRAOCULAR LENS PLACEMENT (IOC);  Surgeon: Tonny Branch, MD;  Location: [facility];  Service: Ophthalmology;  Laterality: Right;  CDE: 13.30  . CATARACT EXTRACTION W/PHACO Left 05/13/2014   Procedure: CATARACT EXTRACTION PHACO AND INTRAOCULAR LENS PLACEMENT LEFT EYE;  Surgeon: Tonny Branch, MD;  Location: [facility];  Service: Ophthalmology;  Laterality: Left;  CDE:13.00  . CHOLECYSTECTOMY N/A 01/15/2015   Procedure: LAPAROSCOPIC CHOLECYSTECTOMY;  Surgeon: Ralene Ok, MD;  Location: [facility];  Service: General;  Laterality: N/A;  . ESOPHAGOGASTRODUODENOSCOPY N/A 01/13/2015   Procedure: ESOPHAGOGASTRODUODENOSCOPY (EGD);  Surgeon: Jerene Bears, MD;  Location: [facility] ENDOSCOPY;  Service: Endoscopy;  Laterality: N/A;  . SHOULDER OPEN ROTATOR CUFF REPAIR     bilateral  . TOTAL KNEE ARTHROPLASTY  05/26/10   right  . TOTAL KNEE ARTHROPLASTY  11/17/2010   Procedure: TOTAL KNEE ARTHROPLASTY;  Surgeon: Mauri Pole;  Location: [facility];  Service: Orthopedics;  Laterality: Left;  . TOTAL KNEE ARTHROPLASTY     Right  . TOTAL SHOULDER REPLACEMENT  01/2010   left    OB History    No data available       Home Medications    Prior to Admission medications   Medication  Sig Start Date End Date Taking? Authorizing Provider  atenolol (TENORMIN) 25 MG tablet Take 25 mg by mouth daily. Reported on 01/27/2015   Yes Historical Provider, MD  cholecalciferol (VITAMIN D) 1000 units tablet Take 1 tablet (1,000 Units total) by mouth daily. 07/28/15  Yes Cherre Robins, PharmD  doxycycline (VIBRA-TABS) 100 MG tablet Take 1 tablet (100 mg total) by mouth 2 (two) times daily. 1 po bid Patient taking differently: Take 100 mg by mouth 2 (two) times daily.  08/21/15  Yes Timmothy Euler, MD  flecainide (TAMBOCOR) 100 MG tablet Take 1 tablet (100 mg total) by mouth 2 (two) times daily. 01/28/15  Yes Timmothy Euler, MD  furosemide (LASIX) 40 MG tablet Take 1/2 tablet by mouth  every morning , take 1  tablet by mouth if swelling occurs 08/19/15  Yes Timmothy Euler, MD  gabapentin (NEURONTIN) 300 MG capsule Take 300 mg by mouth 3 (three) times daily. 08/18/15  Yes Historical Provider, MD  GARLIC PO Take 1 tablet by mouth daily after supper.    Yes Historical Provider, MD  levETIRAcetam (KEPPRA) 500 MG tablet Take 250-500 mg by mouth daily.  08/21/15  Yes Historical Provider, MD  levothyroxine (SYNTHROID, LEVOTHROID) 50 MCG tablet Take 50 mcg by mouth daily before breakfast.  01/20/15  Yes Historical Provider, MD  lisinopril (PRINIVIL,ZESTRIL) 10 MG tablet Take 1 tablet by mouth  daily 06/03/15  Yes Timmothy Euler, MD  meclizine (ANTIVERT) 25 MG tablet Take 1 tablet (25 mg total) by mouth 3 (three) times daily as needed for dizziness. 07/10/15  Yes Timmothy Euler, MD  potassium chloride SA (K-DUR,KLOR-CON) 20 MEQ tablet Take 20 mEq by mouth every morning. Take with food 10/19/12  Yes Historical Provider, MD  vitamin B-12 (CYANOCOBALAMIN) 500 MCG tablet Take 500 mcg by mouth every Monday, Wednesday, and Friday.    Yes Historical Provider, MD  warfarin (COUMADIN) 5 MG tablet Take 5 mg by mouth every morning.    Yes Historical Provider, MD  zolpidem (AMBIEN) 10 MG tablet TAKE 1/2 TO 1  TABLET AT BEDTIME AS NEEDED FOR INSOMNIA Patient taking differently: TAKE ONE-HALF TABLET BY MOUTH EVERY DAY AT BEDTIME 08/05/15  Yes Eustaquio Maize, MD  dicyclomine (BENTYL) 10 MG capsule Take 1 capsule (10 mg total) by mouth 4 (four) times daily -  before meals and at bedtime. Patient not taking: Reported on 07/28/2015 07/10/15   Timmothy Euler, MD  HYDROcodone-acetaminophen [facility]) 5-325 MG tablet Take 1 tablet by mouth every 4 (four) hours as needed for severe pain. 08/23/15   Sherwood Gambler, MD  oxybutynin (DITROPAN) 5 MG tablet Take 5 mg by mouth 3 (three) times daily. 07/10/15   Historical Provider, MD  solifenacin (VESICARE) 5 MG tablet Take 1 tablet (5 mg total) by mouth daily. 07/31/15   Timmothy Euler, MD    Family History Family  History  Problem Relation Age of Onset  . COPD Brother   . Atrial fibrillation Brother   . Diabetes Brother   . Liver cancer Brother     Alcohol-related  . Colon cancer Brother   . Heart attack Father   . Rheum arthritis Mother   . Arthritis Mother   . Heart disease Mother   . Coronary artery disease Brother   . Heart attack Daughter   . Gallbladder disease Brother     Social History Social History  Substance Use Topics  . Smoking status: Never Smoker  . Smokeless tobacco: Never Used  . Alcohol use No     Allergies   Penicillins   Review of Systems Review of Systems  Respiratory: Negative for shortness of breath.   Cardiovascular: Positive for chest pain.  Gastrointestinal: Negative for abdominal pain and vomiting.  Genitourinary: Negative for dysuria.  Musculoskeletal: Negative for back pain and neck pain.  Neurological: Negative for dizziness, weakness and headaches.  All other systems reviewed and are negative.    Physical Exam Updated Vital Signs BP (!) 159/54   Pulse (!) 52   Temp 99.4 F (37.4 C) (Temporal)   Resp 17   Ht 5\' 3"  (1.6 m)   Wt 135 lb (61.2 kg)   SpO2 97%   BMI 23.91 kg/m   Physical Exam    Constitutional: She is oriented to person, place, and time. She appears well-developed and well-nourished. No distress.  HENT:  Head: Normocephalic and atraumatic.  Right Ear: External ear normal.  Left Ear: External ear normal.  Nose: Nose normal.  Eyes: Right eye exhibits no discharge. Left eye exhibits no discharge.  Cardiovascular: Normal rate, regular rhythm and normal heart sounds.   Pulmonary/Chest: Effort normal and breath sounds normal. She exhibits tenderness.    Left chest TTP. No crepitus. No ecchymosis  Abdominal: Soft. There is no tenderness.  Musculoskeletal:       Right hip: She exhibits normal range of motion and no tenderness.       Left hip: She exhibits normal range of motion and no tenderness.       Cervical back: She exhibits no tenderness.       Thoracic back: She exhibits no tenderness.       Lumbar back: She exhibits no tenderness.  Neurological: She is alert and oriented to person, place, and time.  5/5 strength in all 4 extremities. Awake, alert, appropriate  Skin: Skin is warm and dry. She is not diaphoretic.  Nursing note and vitals reviewed.    ED Treatments / Results  Labs (all labs ordered are listed, but only abnormal results are displayed) Labs Reviewed - No data to display  EKG  EKG Interpretation None       Radiology Dg Ribs Unilateral W/chest Left  Result Date: 08/23/2015 CLINICAL DATA:  Pain after fall. EXAM: LEFT RIBS AND CHEST - 3+ VIEW COMPARISON:  July 31, 2015 FINDINGS: Minimal atelectasis in the left lung base. The heart, hila, and mediastinum are normal. No suspicious pulmonary nodules or masses. No focal infiltrates. The patient is status post left shoulder replacement. For shortening of the left clavicle is likely from previous trauma and is unchanged. No pneumothorax.  No rib fractures are seen. IMPRESSION: No acute abnormalities.  No rib fracture or pneumothorax. Electronically Signed   By: Dorise Bullion III M.D   On:  08/23/2015 16:10    Procedures Procedures (including critical care time)  Medications Ordered in ED Medications  HYDROcodone-acetaminophen (NORCO/VICODIN) 5-325 MG per tablet 2 tablet (2 tablets Oral Given 08/23/15 1511)     Initial Impression / Assessment and Plan / ED Course  I have reviewed the triage vital signs and the nursing notes.  Pertinent labs & imaging results that were available during my care of the patient were reviewed by me and considered in my medical decision making (see chart for details).  Clinical Course  Comment By Time  Will check rib xray to r/o fracture and/or pneumothorax. However no distress, equal breath sounds. No signs of other injury or pain anywhere but her chest. Will give hydrocodone for pain. Sounds like a mechanical fall. Sherwood Gambler, MD 08/19 1505  Patient feels better after the hydrocodone. X-ray does not show any obvious fractures or pneumothorax. Patient will be given an incentive spirometer and instructed on use. Will discharge with short course of hydrocodone, also encourage Tylenol use at home. Continues to have no headache and I don't think head CT is indicated. Discussed return precautions, follow up with PCP. Sherwood Gambler, MD 08/19 1620    Final Clinical Impressions(s) / ED Diagnoses   Final diagnoses:  Fall, initial encounter  Rib contusion, left, initial encounter    New Prescriptions New Prescriptions   HYDROCODONE-ACETAMINOPHEN (NORCO) 5-325 MG TABLET    Take 1 tablet by mouth every 4 (four) hours as needed for severe pain.     Sherwood Gambler, MD 08/23/15 [phone]

## 2015-08-25 ENCOUNTER — Other Ambulatory Visit: Payer: Self-pay | Admitting: Pediatrics

## 2015-08-25 NOTE — Telephone Encounter (Signed)
Seen Bradshaw - 08/2015.

## 2015-08-25 NOTE — Progress Notes (Signed)
   HPI  Patient presents today here for Medicare annual wellness visit, however we have addressed a few acute illnesses as well.  Patient has our he had a Medicare with annual wellness visit, so this note has been rewritten to summarize the findings written and the previous note.  Cough 3-4 weeks of progressive nonproductive cough. Occasional night sweats No fevers, chills Positive malaise as well, however she's tolerating foods and fluids normally.  Ulcer on the toe Left second toe with persistent ulcer, now present for more than 2 years. I previously evaluated this in November 2016 at which time it had cellulitis. We've been concerned about osteomyelitis previously, however x-rays did not show any signs and it improved after treatment. However over the last few months it has persisted to be painful, tender, and cause problems while she is walking in shoes. She comes in today saying "can we just cut off". She denies fever, chills, sweats. She has not had any redness recently. She keeps it covered most the time with antibiotic ointment   PMH: Smoking status noted ROS: Per HPI  Objective: BP 130/69   Pulse 61   Temp 97.1 F (36.2 C) (Oral)   Ht 5\' 3"  (1.6 m)   Wt 136 lb 4 oz (61.8 kg)   BMI 24.14 kg/m  Gen: NAD, alert, cooperative with exam HEENT: NCAT CV: RRR, good S1/S2, no murmur Resp: CTABL, no wheezes, non-labored Ext: Ext: Left second toe with 6 mm x 9 mm roughly circular ulcer on the medial surface, probing of the base does not reveal exposed bone. Neuro: Alert and oriented, No gross deficits  X-ray did not show any signs of osteomyelitis or fracture.  Assessment and plan:  # Acute bronchitis New problem Cough and cold symptoms, persistent cough for longer than expected course for usual viral illness. Doxycycline, which should also cover the toe.   # Toe ulcer X-ray today does not show any signs of osteomyelitis, although where this is not a definitive  test. Consider MRI, however I think at this time referral is probably appropriate. Referred to the surgery, foot specialist, Dr. Doran Durand Appreciate recommendations.  Risk with treatment doxycycline, as patient is on Coumadin as well, she has close follow-up with clinical pharmacist for Coumadin dosing.      Orders Placed This Encounter  Procedures  . DG Foot Complete Left    Standing Status:   Future    Number of Occurrences:   1    Standing Expiration Date:   10/20/2016    Order Specific Question:   Reason for Exam (SYMPTOM  OR DIAGNOSIS REQUIRED)    Answer:   chronic ulcer, looking for osteo    Order Specific Question:   Preferred imaging location?    Answer:   External    Meds ordered this encounter  Medications  . doxycycline (VIBRA-TABS) tablet 100 mg    Laroy Apple, MD [facility] 08/25/2015, 5:20 PM

## 2015-08-26 ENCOUNTER — Telehealth: Payer: Self-pay | Admitting: Family Medicine

## 2015-08-26 NOTE — Telephone Encounter (Signed)
Pt given appt with Dr.Bradshaw 8/24 at 2:55.

## 2015-08-26 NOTE — Telephone Encounter (Signed)
rx called into pharmacy

## 2015-08-28 ENCOUNTER — Encounter: Payer: Self-pay | Admitting: Family Medicine

## 2015-08-28 ENCOUNTER — Ambulatory Visit (INDEPENDENT_AMBULATORY_CARE_PROVIDER_SITE_OTHER): Payer: Medicare Other | Admitting: Family Medicine

## 2015-08-28 VITALS — BP 138/66 | HR 65 | Temp 97.2°F | Ht 63.0 in | Wt 134.4 lb

## 2015-08-28 DIAGNOSIS — S20212D Contusion of left front wall of thorax, subsequent encounter: Secondary | ICD-10-CM | POA: Diagnosis not present

## 2015-08-28 MED ORDER — HYDROCODONE-ACETAMINOPHEN 5-325 MG PO TABS: 1.0000 | ORAL_TABLET | Freq: Three times a day (TID) | ORAL | 0 refills | Status: DC | PRN

## 2015-08-28 NOTE — Progress Notes (Signed)
   HPI  Patient presents today here for ER follow-up after a fall.  Patient states that she was seen on 8/19 in the emergency room after she fell tripping on uneven gravel. She states that she has been slightly more off balance than usual but does well with her cane.  She states that since the fall she's had left-sided chest pain worse with coughing and deep inspiration.  She denies shortness of breath, increased cough, difficulty tolerating food or fluids, or fever. She feels well except for the areas of bruising and tenderness that expected after falling. She has been using the incentive spirometer every 2-3 hours as directed   PMH: Smoking status noted ROS: Per HPI  Objective: BP 138/66   Pulse 65   Temp 97.2 F (36.2 C) (Oral)   Ht 5\' 3"  (1.6 m)   Wt 134 lb 6.4 oz (61 kg)   BMI 23.81 kg/m  Gen: NAD, alert, cooperative with exam HEENT: NCAT CV: RRR, good S1/S2, no murmur Resp: CTABL, no wheezes, non-labored Ext: No edema, warm Neuro: Alert and oriented, No gross deficits  Assessment and plan:  # Chest wall contusion after a fall. Patient doing well considering the severity of her recent fall I prescribed a small amount of hydrocodone for sleep, she's having rib pain with trying to rest. Discussed using her cane more frequently and more consistently. Discussed caution around using hydrocodone  Also she has forgotten when her orthopedic appointment is-we have called and confirmed her appointment and let her know when it is. 9/29 at 9:30 AM- Dr. Doran Durand.     Meds ordered this encounter  Medications  . HYDROcodone-acetaminophen (NORCO) 5-325 MG tablet    Sig: Take 1 tablet by mouth every 8 (eight) hours as needed for severe pain.    Dispense:  20 tablet    Refill:  0    Laroy Apple, MD [facility] 08/28/2015, 3:18 PM

## 2015-08-28 NOTE — Patient Instructions (Signed)
Great to see you!  You have an appointment with:   Dr Doran Durand at [facility] orthopedics 10/03/2015 at 930 am  Keep using the incentive spirometer every 2 hours

## 2015-08-30 ENCOUNTER — Other Ambulatory Visit: Payer: Self-pay | Admitting: Family Medicine

## 2015-09-01 ENCOUNTER — Ambulatory Visit (INDEPENDENT_AMBULATORY_CARE_PROVIDER_SITE_OTHER): Payer: Medicare Other | Admitting: Pharmacist

## 2015-09-01 DIAGNOSIS — I480 Paroxysmal atrial fibrillation: Secondary | ICD-10-CM | POA: Diagnosis not present

## 2015-09-01 LAB — COAGUCHEK XS/INR WAIVED
INR: 2.9 — AB (ref 0.9–1.1)
PROTHROMBIN TIME: 34.3 s

## 2015-09-09 ENCOUNTER — Other Ambulatory Visit: Payer: Self-pay | Admitting: Family Medicine

## 2015-09-18 ENCOUNTER — Encounter: Payer: Self-pay | Admitting: Nurse Practitioner

## 2015-09-18 ENCOUNTER — Ambulatory Visit (INDEPENDENT_AMBULATORY_CARE_PROVIDER_SITE_OTHER): Payer: Medicare Other | Admitting: Nurse Practitioner

## 2015-09-18 VITALS — BP 110/64 | HR 74 | Temp 96.8°F | Ht 63.0 in | Wt 134.0 lb

## 2015-09-18 DIAGNOSIS — K625 Hemorrhage of anus and rectum: Secondary | ICD-10-CM | POA: Diagnosis not present

## 2015-09-18 LAB — FINGERSTICK HEMOGLOBIN: HEMOGLOBIN: 10.2 g/dL — AB (ref 11.1–15.9)

## 2015-09-18 NOTE — Progress Notes (Signed)
   Subjective:    Patient ID: [name], female    DOB: 1932-07-16, 82 y.o.   MRN: [id number]  HPI Patient comes in c/o black stools for 3-4 days. Her husband says that she has hemorrhoids and she has been having to push them back in. [facility] feels tired and weak. SHe is very anxious about her bleeding.  Review of Systems  Constitutional: Positive for appetite change (decreased appetite). Negative for chills and fever.  HENT: Negative for congestion, ear pain, rhinorrhea, sore throat and trouble swallowing.   Respiratory: Positive for cough (slight in the mornings).   Cardiovascular: Negative.   Gastrointestinal: Negative.   Genitourinary: Negative.   Neurological: Negative.   Psychiatric/Behavioral: Negative.   All other systems reviewed and are negative.      Objective:   Physical Exam  Constitutional: She appears well-developed and well-nourished. No distress.  Cardiovascular: Normal rate and normal heart sounds.   Pulmonary/Chest: Effort normal and breath sounds normal.  Abdominal: Soft. Bowel sounds are normal. There is tenderness (mild suprapubic tendernesson palpation.).  Genitourinary:  Genitourinary Comments: Small non thrombosed external hemorrhoids Internal exam not performed. Slight blood stain in underwear.  Skin: Skin is warm.  Psychiatric: She has a normal mood and affect. Her behavior is normal. Judgment and thought content normal.   BP 110/64   Pulse 74   Temp (!) 96.8 F (36 C) (Oral)   Ht 5\' 3"  (1.6 m)   Wt 134 lb (60.8 kg)   BMI 23.74 kg/m   hgb 10.2     Assessment & Plan:   1. Rectal bleeding    Iron supplements OTC daily Recheck hgb Monday Orders Placed This Encounter  Procedures  . Fingerstick Hemoglobin  . Ambulatory referral to Gastroenterology    Referral Priority:   Urgent    Referral Type:   Consultation    Referral Reason:   Specialty Services Required    Number of Visits Requested:   [place], [place]

## 2015-09-22 ENCOUNTER — Ambulatory Visit (INDEPENDENT_AMBULATORY_CARE_PROVIDER_SITE_OTHER): Payer: Medicare Other | Admitting: Nurse Practitioner

## 2015-09-22 ENCOUNTER — Encounter: Payer: Self-pay | Admitting: Nurse Practitioner

## 2015-09-22 VITALS — BP 142/64 | HR 57 | Temp 96.9°F | Ht 63.0 in | Wt 134.0 lb

## 2015-09-22 DIAGNOSIS — K625 Hemorrhage of anus and rectum: Secondary | ICD-10-CM | POA: Diagnosis not present

## 2015-09-22 DIAGNOSIS — D649 Anemia, unspecified: Secondary | ICD-10-CM | POA: Diagnosis not present

## 2015-09-22 LAB — FINGERSTICK HEMOGLOBIN: Hemoglobin: 12.5 g/dL (ref 11.1–15.9)

## 2015-09-22 NOTE — Progress Notes (Signed)
   Subjective:    Patient ID: [name], female    DOB: Jun 04, 1932, 82 y.o.   MRN: [id number]  HPI  Patient was seen last Thursday with rectal bleeding and a hgb of 10.2. A referral was made to GI and she is here today to make sure Hgb has not dropped further. Still has a trace of bright blood in stoll-no dark tarry stool. SHe feels much better today.   Review of Systems  Constitutional: Negative.   HENT: Negative.   Respiratory: Negative.   Cardiovascular: Negative.   Gastrointestinal: Positive for blood in stool. Negative for constipation, diarrhea, nausea, rectal pain and vomiting.  Genitourinary: Negative.   Neurological: Negative.   Psychiatric/Behavioral: Negative.   All other systems reviewed and are negative.      Objective:   Physical Exam  Constitutional: She is oriented to person, place, and time. She appears well-developed and well-nourished. No distress.  Cardiovascular: Normal rate, regular rhythm and normal heart sounds.   Pulmonary/Chest: Effort normal and breath sounds normal.  Neurological: She is alert and oriented to person, place, and time.  Skin: Skin is warm.  Psychiatric: She has a normal mood and affect. Her behavior is normal. Judgment and thought content normal.    BP (!) 142/64 (BP Location: Left Arm, Cuff Size: Normal)   Pulse (!) 57   Temp (!) 96.9 F (36.1 C) (Oral)   Ht 5\' 3"  (1.6 m)   Wt 134 lb (60.8 kg)   BMI 23.74 kg/m   hgb- 12.5- up from 10.5 last week      Assessment & Plan:   1. Low hemoglobin   2. Rectal bleeding    Continue iron supplements Keep appointment with GI Follow up prn  Mary-Margaret Hassell Done, FNP

## 2015-09-23 ENCOUNTER — Encounter (INDEPENDENT_AMBULATORY_CARE_PROVIDER_SITE_OTHER): Payer: Self-pay | Admitting: *Deleted

## 2015-09-23 ENCOUNTER — Telehealth (INDEPENDENT_AMBULATORY_CARE_PROVIDER_SITE_OTHER): Payer: Self-pay | Admitting: *Deleted

## 2015-09-23 ENCOUNTER — Ambulatory Visit (INDEPENDENT_AMBULATORY_CARE_PROVIDER_SITE_OTHER): Payer: Medicare Other | Admitting: Internal Medicine

## 2015-09-23 ENCOUNTER — Other Ambulatory Visit (INDEPENDENT_AMBULATORY_CARE_PROVIDER_SITE_OTHER): Payer: Self-pay | Admitting: Internal Medicine

## 2015-09-23 ENCOUNTER — Encounter (INDEPENDENT_AMBULATORY_CARE_PROVIDER_SITE_OTHER): Payer: Self-pay | Admitting: Internal Medicine

## 2015-09-23 VITALS — BP 142/80 | HR 64 | Temp 97.3°F | Ht 63.0 in | Wt 133.3 lb

## 2015-09-23 DIAGNOSIS — K625 Hemorrhage of anus and rectum: Secondary | ICD-10-CM

## 2015-09-23 DIAGNOSIS — K921 Melena: Secondary | ICD-10-CM

## 2015-09-23 MED ORDER — PEG 3350-KCL-NA BICARB-NACL 420 G PO SOLR: 4000.0000 mL | Freq: Once | ORAL | 0 refills | Status: AC

## 2015-09-23 NOTE — Telephone Encounter (Addendum)
FYI: blood thinner -- Patient is scheduled for TCS/EGD Monday (9/25) -- per Dr Wendi Snipes it is ok for patient stop Warfarin 5 days prior; however he wants her to start back 24 hours after, patient is aware

## 2015-09-23 NOTE — Telephone Encounter (Signed)
Patient needs trilyte 

## 2015-09-23 NOTE — Patient Instructions (Signed)
EGD/Colonoscopy. The risks and benefits such as perforation, bleeding, and infection were reviewed with the patient and is agreeable. 

## 2015-09-23 NOTE — Progress Notes (Signed)
Subjective:    Patient ID: [name], female    DOB: 07-24-1932, 82 y.o.   MRN: [id number]  HPIPresents today with c/o that she says she had bleeding hemorrhoids. She saw Midge Minium NP last Friday.  She says she saw a small amt of blood this morning. She also tells me her stools were black x 5 days (last week)..  Norva Riffle was not done at [place] by her PCP.  Stools back to normal this past Sunday.  She describes the bleeding as a small amt.  She also tells me she has been constipated. For the most part her appetite is okay. She is trying to eat more.  She has a BM every 2 days.    01/13/2015 EGD: epigastric pain, nausea and vomiting: Dr. Lajuan Lines. Pyrtle. The mucosa of the esophagus appeared normal. $cm hiatal hernia. The mucosa of the stomach appeared normal. The duodenal mucosa showed no abnormalities in the bulb and 2nd part of the duodenum.  Colonoscopy Dr. Maurene Capes 2 /06/2010:abdominal pain, anemia, rectal bleeding.  internal hemorrhoids, moderate diverticulosis, sessile polyp in the sigmoid colon. Biopsy:Benign small bowel mucosa. No villous atrophy, inflammation or other abnormalities     Hx of atrial fib and maintained on Warfarin.  09/22/2015 Hemoglobin 12.5 09/18/2015 Hemoglobin 10.2.  CBC    Component Value Date/Time   WBC 7.7 07/31/2015 1008   WBC 9.9 04/21/2015 1026   RBC 4.14 07/31/2015 1008   RBC 4.35 04/21/2015 1026   HGB 15.3 (H) 04/21/2015 1027   HCT 39.6 07/31/2015 1008   PLT 311 07/31/2015 1008   MCV 96 07/31/2015 1008   MCH 31.6 07/31/2015 1008   MCH 31.3 04/21/2015 1026   MCHC 33.1 07/31/2015 1008   MCHC 34.1 04/21/2015 1026   RDW 14.2 07/31/2015 1008   LYMPHSABS 1.3 07/31/2015 1008   MONOABS 0.5 04/21/2015 1026   EOSABS 0.0 07/31/2015 1008   BASOSABS 0.0 07/31/2015 1008       Review of Systems Past Medical History:  Diagnosis Date  . Anemia   . Atrial fibrillation (HCC)    Paroxysmal, Flecainide therapy  . Calf pain    September, 2012, at rest  .  Cardiomegaly    EF normal, echo, 2003  //   EF 60-65%, echo, June 18, 123456, diastolic dysfunction,  . Carotid bruit    Doppler, December, 2009, no abnormality  . Cataract   . GERD (gastroesophageal reflux disease)   . History of shingles   . HLD (hyperlipidemia)   . HTN (hypertension)   . Hypothyroidism   . Hypothyroidism   . Insomnia   . Normal nuclear stress test    Normal, 2001  . Osteoarthritis   . PONV (postoperative nausea and vomiting)   . Rectal bleeding 2001   diverticulosis and int hemorrhoids on 07/1999 and 02/2010 colonoscopies.  . Stroke ([place])   . Warfarin anticoagulation     Past Surgical History:  Procedure Laterality Date  . CATARACT EXTRACTION W/PHACO Right 04/15/2014   Procedure: CATARACT EXTRACTION PHACO AND INTRAOCULAR LENS PLACEMENT (IOC);  Surgeon: Tonny Branch, MD;  Location: [facility];  Service: Ophthalmology;  Laterality: Right;  CDE: 13.30  . CATARACT EXTRACTION W/PHACO Left 05/13/2014   Procedure: CATARACT EXTRACTION PHACO AND INTRAOCULAR LENS PLACEMENT LEFT EYE;  Surgeon: Tonny Branch, MD;  Location: [facility];  Service: Ophthalmology;  Laterality: Left;  CDE:13.00  . CHOLECYSTECTOMY N/A 01/15/2015   Procedure: LAPAROSCOPIC CHOLECYSTECTOMY;  Surgeon: Ralene Ok, MD;  Location: [facility];  Service: General;  Laterality: N/A;  . ESOPHAGOGASTRODUODENOSCOPY N/A 01/13/2015   Procedure: ESOPHAGOGASTRODUODENOSCOPY (EGD);  Surgeon: Jerene Bears, MD;  Location: [facility] ENDOSCOPY;  Service: Endoscopy;  Laterality: N/A;  . SHOULDER OPEN ROTATOR CUFF REPAIR     bilateral  . TOTAL KNEE ARTHROPLASTY  05/26/10   right  . TOTAL KNEE ARTHROPLASTY  11/17/2010   Procedure: TOTAL KNEE ARTHROPLASTY;  Surgeon: Mauri Pole;  Location: [facility];  Service: Orthopedics;  Laterality: Left;  . TOTAL KNEE ARTHROPLASTY     Right  . TOTAL SHOULDER REPLACEMENT  01/2010   left    Allergies  Allergen Reactions  . Penicillins Rash    Has patient had a PCN reaction causing immediate rash,  facial/tongue/throat swelling, SOB or lightheadedness with hypotension: Yes Has patient had a PCN reaction causing severe rash involving mucus membranes or skin necrosis: No Has patient had a PCN reaction that required hospitalization No Has patient had a PCN reaction occurring within the last 10 years: No If all of the above answers are "NO", then may proceed with Cephalosporin use.    Current Outpatient Prescriptions on File Prior to Visit  Medication Sig Dispense Refill  . atenolol (TENORMIN) 25 MG tablet Take 25 mg by mouth daily. Reported on 01/27/2015    . atorvastatin (LIPITOR) 20 MG tablet Take 20 mg by mouth daily.    . cholecalciferol (VITAMIN D) 1000 units tablet Take 1 tablet (1,000 Units total) by mouth daily.    . flecainide (TAMBOCOR) 100 MG tablet Take 1 tablet (100 mg total) by mouth 2 (two) times daily. 60 tablet 0  . furosemide (LASIX) 40 MG tablet Take 1/2 tablet by mouth  every morning , take 1  tablet by mouth if swelling occurs 90 tablet 1  . gabapentin (NEURONTIN) 100 MG capsule Take 1 capsule by mouth in  the morning 90 capsule 0  . GARLIC PO Take 1 tablet by mouth daily after supper.     . levETIRAcetam (KEPPRA) 500 MG tablet Take 250-500 mg by mouth daily.     Marland Kitchen levothyroxine (SYNTHROID, LEVOTHROID) 50 MCG tablet Take 50 mcg by mouth daily before breakfast.     . lisinopril (PRINIVIL,ZESTRIL) 10 MG tablet Take 1 tablet by mouth  daily 60 tablet 4  . potassium chloride SA (K-DUR,KLOR-CON) 20 MEQ tablet Take 20 mEq by mouth every morning. Take with food    . solifenacin (VESICARE) 5 MG tablet Take 1 tablet (5 mg total) by mouth daily. 30 tablet 3  . vitamin B-12 (CYANOCOBALAMIN) 500 MCG tablet Take 500 mcg by mouth every Monday, Wednesday, and Friday.     . warfarin (COUMADIN) 5 MG tablet Take 5 mg by mouth every morning.     . zolpidem (AMBIEN) 10 MG tablet TAKE 1/2 TO 1 TABLET AT BEDTIME AS NEEDED FOR INSOMNIA 15 tablet 2   No current facility-administered  medications on file prior to visit.        Objective:   Physical Exam Blood pressure (!) 142/80, pulse 64, temperature 97.3 F (36.3 C), height 5\' 3"  (1.6 m), weight 133 lb 4.8 oz (60.5 kg). Alert and oriented. Skin warm and dry. Oral mucosa is moist.   . Sclera anicteric, conjunctivae is pink. Thyroid not enlarged. No cervical lymphadenopathy. Lungs clear. Heart regular rate and rhythm.  Abdomen is soft. Bowel sounds are positive. No hepatomegaly. No abdominal masses felt. No tenderness.  No edema to lower extremities.  Stools brown and guaiac positive.  Assessment & Plan:  Melena. Needs EGD to rule out PUD. Colonoscopy: to rule of colon cancer, polyp, AVM, hemorrhoids. The risks and benefits such as perforation, bleeding, and infection were reviewed with the patient and is agreeable.

## 2015-09-24 NOTE — Telephone Encounter (Signed)
Recommendations regarding warfarin interruption for procedure noted.

## 2015-09-25 DIAGNOSIS — H04123 Dry eye syndrome of bilateral lacrimal glands: Secondary | ICD-10-CM | POA: Diagnosis not present

## 2015-09-25 DIAGNOSIS — Z961 Presence of intraocular lens: Secondary | ICD-10-CM | POA: Diagnosis not present

## 2015-09-28 ENCOUNTER — Observation Stay (HOSPITAL_COMMUNITY)
Admission: EM | Admit: 2015-09-28 | Discharge: 2015-09-29 | Disposition: A | Discharge status: Home / Self Care | Payer: Medicare Other | Attending: Internal Medicine | Admitting: Internal Medicine

## 2015-09-28 ENCOUNTER — Encounter (HOSPITAL_COMMUNITY): Payer: Self-pay | Admitting: Emergency Medicine

## 2015-09-28 ENCOUNTER — Emergency Department (HOSPITAL_COMMUNITY): Payer: Medicare Other

## 2015-09-28 DIAGNOSIS — E876 Hypokalemia: Secondary | ICD-10-CM | POA: Diagnosis not present

## 2015-09-28 DIAGNOSIS — K449 Diaphragmatic hernia without obstruction or gangrene: Secondary | ICD-10-CM | POA: Diagnosis not present

## 2015-09-28 DIAGNOSIS — R109 Unspecified abdominal pain: Secondary | ICD-10-CM

## 2015-09-28 DIAGNOSIS — I480 Paroxysmal atrial fibrillation: Secondary | ICD-10-CM | POA: Diagnosis not present

## 2015-09-28 DIAGNOSIS — K5733 Diverticulitis of large intestine without perforation or abscess with bleeding: Secondary | ICD-10-CM

## 2015-09-28 DIAGNOSIS — G40909 Epilepsy, unspecified, not intractable, without status epilepticus: Secondary | ICD-10-CM | POA: Diagnosis not present

## 2015-09-28 DIAGNOSIS — K921 Melena: Secondary | ICD-10-CM | POA: Diagnosis not present

## 2015-09-28 DIAGNOSIS — Z79899 Other long term (current) drug therapy: Secondary | ICD-10-CM | POA: Insufficient documentation

## 2015-09-28 DIAGNOSIS — Z7901 Long term (current) use of anticoagulants: Secondary | ICD-10-CM | POA: Diagnosis not present

## 2015-09-28 DIAGNOSIS — K625 Hemorrhage of anus and rectum: Secondary | ICD-10-CM | POA: Diagnosis not present

## 2015-09-28 DIAGNOSIS — E039 Hypothyroidism, unspecified: Secondary | ICD-10-CM | POA: Diagnosis not present

## 2015-09-28 DIAGNOSIS — R1084 Generalized abdominal pain: Secondary | ICD-10-CM | POA: Diagnosis not present

## 2015-09-28 DIAGNOSIS — Z8673 Personal history of transient ischemic attack (TIA), and cerebral infarction without residual deficits: Secondary | ICD-10-CM | POA: Insufficient documentation

## 2015-09-28 DIAGNOSIS — K2970 Gastritis, unspecified, without bleeding: Secondary | ICD-10-CM | POA: Diagnosis not present

## 2015-09-28 DIAGNOSIS — R112 Nausea with vomiting, unspecified: Secondary | ICD-10-CM | POA: Diagnosis not present

## 2015-09-28 DIAGNOSIS — K5730 Diverticulosis of large intestine without perforation or abscess without bleeding: Secondary | ICD-10-CM | POA: Insufficient documentation

## 2015-09-28 DIAGNOSIS — I10 Essential (primary) hypertension: Secondary | ICD-10-CM | POA: Diagnosis not present

## 2015-09-28 DIAGNOSIS — E871 Hypo-osmolality and hyponatremia: Secondary | ICD-10-CM | POA: Diagnosis not present

## 2015-09-28 DIAGNOSIS — K648 Other hemorrhoids: Secondary | ICD-10-CM | POA: Insufficient documentation

## 2015-09-28 DIAGNOSIS — K3189 Other diseases of stomach and duodenum: Secondary | ICD-10-CM | POA: Insufficient documentation

## 2015-09-28 DIAGNOSIS — M1990 Unspecified osteoarthritis, unspecified site: Secondary | ICD-10-CM | POA: Diagnosis not present

## 2015-09-28 DIAGNOSIS — Z96652 Presence of left artificial knee joint: Secondary | ICD-10-CM | POA: Insufficient documentation

## 2015-09-28 DIAGNOSIS — E785 Hyperlipidemia, unspecified: Secondary | ICD-10-CM | POA: Insufficient documentation

## 2015-09-28 DIAGNOSIS — K5732 Diverticulitis of large intestine without perforation or abscess without bleeding: Secondary | ICD-10-CM | POA: Diagnosis not present

## 2015-09-28 DIAGNOSIS — R197 Diarrhea, unspecified: Secondary | ICD-10-CM

## 2015-09-28 DIAGNOSIS — Z96612 Presence of left artificial shoulder joint: Secondary | ICD-10-CM | POA: Insufficient documentation

## 2015-09-28 LAB — COMPREHENSIVE METABOLIC PANEL WITH GFR
ALT: 13 U/L — ABNORMAL LOW (ref 14–54)
AST: 22 U/L (ref 15–41)
Albumin: 3.9 g/dL (ref 3.5–5.0)
Alkaline Phosphatase: 48 U/L (ref 38–126)
Anion gap: 9 (ref 5–15)
BUN: 6 mg/dL (ref 6–20)
CO2: 25 mmol/L (ref 22–32)
Calcium: 8.5 mg/dL — ABNORMAL LOW (ref 8.9–10.3)
Chloride: 98 mmol/L — ABNORMAL LOW (ref 101–111)
Creatinine, Ser: 0.47 mg/dL (ref 0.44–1.00)
GFR calc Af Amer: >60 mL/min
GFR calc non Af Amer: >60 mL/min
Glucose, Bld: 108 mg/dL — ABNORMAL HIGH (ref 65–99)
Potassium: 3.2 mmol/L — ABNORMAL LOW (ref 3.5–5.1)
Sodium: 132 mmol/L — ABNORMAL LOW (ref 135–145)
Total Bilirubin: 0.8 mg/dL (ref 0.3–1.2)
Total Protein: 6.8 g/dL (ref 6.5–8.1)

## 2015-09-28 LAB — URINALYSIS, ROUTINE W REFLEX MICROSCOPIC
Bilirubin Urine: NEGATIVE
GLUCOSE, UA: NEGATIVE mg/dL
Ketones, ur: NEGATIVE mg/dL
Leukocytes, UA: NEGATIVE
Nitrite: NEGATIVE
PH: 7.5 (ref 5.0–8.0)
Protein, ur: NEGATIVE mg/dL
SPECIFIC GRAVITY, URINE: 1.01 (ref 1.005–1.030)

## 2015-09-28 LAB — URINE MICROSCOPIC-ADD ON: WBC, UA: NONE SEEN WBC/hpf (ref 0–5)

## 2015-09-28 LAB — CBC
HEMATOCRIT: 38.3 % (ref 36.0–46.0)
HEMOGLOBIN: 13 g/dL (ref 12.0–15.0)
MCH: 32 pg (ref 26.0–34.0)
MCHC: 33.9 g/dL (ref 30.0–36.0)
MCV: 94.3 fL (ref 78.0–100.0)
Platelets: 274 10*3/uL (ref 150–400)
RBC: 4.06 MIL/uL (ref 3.87–5.11)
RDW: 13.3 % (ref 11.5–15.5)
WBC: 7.9 10*3/uL (ref 4.0–10.5)

## 2015-09-28 LAB — PROTIME-INR
INR: 0.96
Prothrombin Time: 12.8 seconds (ref 11.4–15.2)

## 2015-09-28 LAB — LIPASE, BLOOD: LIPASE: 21 U/L (ref 11–51)

## 2015-09-28 MED ORDER — IOPAMIDOL (ISOVUE-300) INJECTION 61%
100.0000 mL | Freq: Once | INTRAVENOUS | Status: AC | PRN
  Administered 2015-09-28: 100 mL via INTRAVENOUS

## 2015-09-28 NOTE — ED Triage Notes (Signed)
Pt was prepping for a colonoscopy, began having vomiting with the loose bowel mvmts. Pt reports bleeding from her rectum.

## 2015-09-28 NOTE — ED Provider Notes (Signed)
[facility] DEPT Provider Note   CSN: [id number] Arrival date & time: 09/28/15  1812     History   Chief Complaint Chief Complaint  Patient presents with  . Emesis  . Rectal Bleeding    HPI [name] is a 82 y.o. female.  HPI  Pt was seen at [place].  Per pt, c/o gradual onset and persistence of constant generalized abd "pain" that began today.  Has been associated with multiple intermittent episodes of N/V and rectal bleeding.  Describes the abd pain as "sore." Pt states she has been prepping for her colonoscopy tomorrow when she developed symptoms. Describes the stools and both "black" and "bloody."  Denies fevers, no back pain, no rash, no CP/SOB, no black or blood in emesis.      Past Medical History:  Diagnosis Date  . Anemia   . Atrial fibrillation (HCC)    Paroxysmal, Flecainide therapy  . Calf pain    September, 2012, at rest  . Cardiomegaly    EF normal, echo, 2003  //   EF 60-65%, echo, June 18, 123456, diastolic dysfunction,  . Carotid bruit    Doppler, December, 2009, no abnormality  . Cataract   . Diverticulosis   . GERD (gastroesophageal reflux disease)   . History of shingles   . HLD (hyperlipidemia)   . HTN (hypertension)   . Hypothyroidism   . Hypothyroidism   . Insomnia   . Normal nuclear stress test    Normal, 2001  . Osteoarthritis   . PONV (postoperative nausea and vomiting)   . Rectal bleeding 2001   diverticulosis and int hemorrhoids on 07/1999 and 02/2010 colonoscopies.  . Stroke ([place])   . Warfarin anticoagulation     Patient Active Problem List   Diagnosis Date Noted  . Melena 09/23/2015  . Overactive bladder 05/02/2015  . Acute cerebrovascular accident (CVA) ([place]) 04/21/2015  . Acute cardioembolic stroke ([place]) [id number]  . Healthcare maintenance 02/20/2015  . History of dysarthria 02/20/2015  . Enteritis due to Clostridium difficile 01/27/2015  . HTN (hypertension) 01/11/2015  . Hyponatremia 01/11/2015  . Rectal bleeding  01/11/2015  . Chronic pain syndrome 11/25/2014  . Insomnia 11/25/2014  . Chronic ulcer of toe ([place]) 11/13/2014  . Diastolic dysfunction 0000000  . Paroxysmal atrial fibrillation ([place]) 10/14/2014  . Warfarin anticoagulation   . Hypothyroidism   . Carotid bruit   . Normal nuclear stress test   . S/P knee replacement   . UNSPECIFIED ANEMIA 01/19/2010  . DIVERTICULOSIS-COLON 01/19/2010    Past Surgical History:  Procedure Laterality Date  . CATARACT EXTRACTION W/PHACO Right 04/15/2014   Procedure: CATARACT EXTRACTION PHACO AND INTRAOCULAR LENS PLACEMENT (IOC);  Surgeon: Tonny Branch, MD;  Location: [facility];  Service: Ophthalmology;  Laterality: Right;  CDE: 13.30  . CATARACT EXTRACTION W/PHACO Left 05/13/2014   Procedure: CATARACT EXTRACTION PHACO AND INTRAOCULAR LENS PLACEMENT LEFT EYE;  Surgeon: Tonny Branch, MD;  Location: [facility];  Service: Ophthalmology;  Laterality: Left;  CDE:13.00  . CHOLECYSTECTOMY N/A 01/15/2015   Procedure: LAPAROSCOPIC CHOLECYSTECTOMY;  Surgeon: Ralene Ok, MD;  Location: [facility];  Service: General;  Laterality: N/A;  . ESOPHAGOGASTRODUODENOSCOPY N/A 01/13/2015   Procedure: ESOPHAGOGASTRODUODENOSCOPY (EGD);  Surgeon: Jerene Bears, MD;  Location[facility];  Service: Endoscopy;  Laterality: N/A;  . SHOULDER OPEN ROTATOR CUFF REPAIR     bilateral  . TOTAL KNEE ARTHROPLASTY  05/26/10   right  . TOTAL KNEE ARTHROPLASTY  11/17/2010   Procedure: TOTAL KNEE ARTHROPLASTY;  Surgeon:  Mauri Pole;  Location: [facility];  Service: Orthopedics;  Laterality: Left;  . TOTAL KNEE ARTHROPLASTY     Right  . TOTAL SHOULDER REPLACEMENT  01/2010   left    OB History    Gravida Para Term Preterm AB Living   1 1 1          SAB TAB Ectopic Multiple Live Births                   Home Medications    Prior to Admission medications   Medication Sig Start Date End Date Taking? Authorizing Provider  atenolol (TENORMIN) 25 MG tablet Take 25 mg by mouth daily. Reported on  01/27/2015    Historical Provider, MD  atorvastatin (LIPITOR) 20 MG tablet Take 20 mg by mouth daily.    Historical Provider, MD  cholecalciferol (VITAMIN D) 1000 units tablet Take 1 tablet (1,000 Units total) by mouth daily. 07/28/15   Cherre Robins, PharmD  flecainide (TAMBOCOR) 100 MG tablet Take 1 tablet (100 mg total) by mouth 2 (two) times daily. 01/28/15   Timmothy Euler, MD  furosemide (LASIX) 40 MG tablet Take 1/2 tablet by mouth  every morning , take 1  tablet by mouth if swelling occurs 08/19/15   Timmothy Euler, MD  gabapentin (NEURONTIN) 100 MG capsule Take 1 capsule by mouth in  the morning 09/09/15   Timmothy Euler, MD  GARLIC PO Take 1 tablet by mouth daily after supper.     Historical Provider, MD  levETIRAcetam (KEPPRA) 500 MG tablet Take 250-500 mg by mouth daily.  08/21/15   Historical Provider, MD  levothyroxine (SYNTHROID, LEVOTHROID) 50 MCG tablet Take 50 mcg by mouth daily before breakfast.  01/20/15   Historical Provider, MD  lisinopril (PRINIVIL,ZESTRIL) 10 MG tablet Take 1 tablet by mouth  daily 06/03/15   Timmothy Euler, MD  potassium chloride SA (K-DUR,KLOR-CON) 20 MEQ tablet Take 20 mEq by mouth every morning. Take with food 10/19/12   Historical Provider, MD  solifenacin (VESICARE) 5 MG tablet Take 1 tablet (5 mg total) by mouth daily. 07/31/15   Timmothy Euler, MD  vitamin B-12 (CYANOCOBALAMIN) 500 MCG tablet Take 500 mcg by mouth every Monday, Wednesday, and Friday.     Historical Provider, MD  warfarin (COUMADIN) 5 MG tablet Take 5 mg by mouth every morning.     Historical Provider, MD  zolpidem (AMBIEN) 10 MG tablet TAKE 1/2 TO 1 TABLET AT BEDTIME AS NEEDED FOR INSOMNIA 08/25/15   Timmothy Euler, MD    Family History Family History  Problem Relation Age of Onset  . COPD Brother   . Atrial fibrillation Brother   . Diabetes Brother   . Liver cancer Brother     Alcohol-related  . Colon cancer Brother   . Heart attack Father   . Rheum arthritis Mother     . Arthritis Mother   . Heart disease Mother   . Coronary artery disease Brother   . Heart attack Daughter   . Gallbladder disease Brother     Social History Social History  Substance Use Topics  . Smoking status: Never Smoker  . Smokeless tobacco: Never Used  . Alcohol use No     Allergies   Penicillins   Review of Systems Review of Systems ROS: Statement: All systems negative except as marked or noted in the HPI; Constitutional: Negative for fever and chills. ; ; Eyes: Negative for eye pain, redness and discharge. ; ;  ENMT: Negative for ear pain, hoarseness, nasal congestion, sinus pressure and sore throat. ; ; Cardiovascular: Negative for chest pain, palpitations, diaphoresis, dyspnea and peripheral edema. ; ; Respiratory: Negative for cough, wheezing and stridor. ; ; Gastrointestinal: Negative for hematemesis, jaundice and +N/V/D, abd pain, blood in stool, rectal bleeding. . ; ; Genitourinary: Negative for dysuria, flank pain and hematuria. ; ; Musculoskeletal: Negative for back pain and neck pain. Negative for swelling and trauma.; ; Skin: Negative for pruritus, rash, abrasions, blisters, bruising and skin lesion.; ; Neuro: Negative for headache, lightheadedness and neck stiffness. Negative for weakness, altered level of consciousness, altered mental status, extremity weakness, paresthesias, involuntary movement, seizure and syncope.     Physical Exam Updated Vital Signs BP 182/86 (BP Location: Right Arm)   Pulse 71   Temp 97.9 F (36.6 C) (Oral)   Resp 16   Ht 5\' 3"  (1.6 m)   Wt 133 lb (60.3 kg)   SpO2 96%   BMI 23.56 kg/m   Physical Exam 1900: Physical examination:  Nursing notes reviewed; Vital signs and O2 SAT reviewed;  Constitutional: Well developed, Well nourished, Well hydrated, In no acute distress; Head:  Normocephalic, atraumatic; Eyes: EOMI, PERRL, No scleral icterus; ENMT: Mouth and pharynx normal, Mucous membranes moist; Neck: Supple, Full range of motion,  No lymphadenopathy; Cardiovascular: Irregular rate and rhythm, No gallop; Respiratory: Breath sounds clear & equal bilaterally, No wheezes.  Speaking full sentences with ease, Normal respiratory effort/excursion; Chest: Nontender, Movement normal; Abdomen: Soft, +diffuse tenderness to palp. No rebound or guarding. Rectal exam performed w/permission of pt and ED RN chaperone present.  Anal tone normal.  Non-tender, minimal brown stool in rectal vault, heme positive.  No fissures, +external hemorrhoids without thrombosis or bleeding. Nondistended, Normal bowel sounds; Genitourinary: No CVA tenderness; Extremities: Pulses normal, No tenderness, No edema, No calf edema or asymmetry.; Neuro: AA&Ox3, vague historian. Major CN grossly intact.  Speech clear. No gross focal motor deficits in extremities.; Skin: Color normal, Warm, Dry.   ED Treatments / Results  Labs (all labs ordered are listed, but only abnormal results are displayed)   EKG  EKG Interpretation None       Radiology   Procedures Procedures (including critical care time)  Medications Ordered in ED Medications - No data to display   Initial Impression / Assessment and Plan / ED Course  I have reviewed the triage vital signs and the nursing notes.  Pertinent labs & imaging results that were available during my care of the patient were reviewed by me and considered in my medical decision making (see chart for details).  MDM Reviewed: previous chart, nursing note and vitals Reviewed previous: labs Interpretation: labs and CT scan   Results for orders placed or performed during the hospital encounter of 09/28/15  Lipase, blood  Result Value Ref Range   Lipase 21 11 - 51 U/L  Comprehensive metabolic panel  Result Value Ref Range   Sodium 132 (L) 135 - 145 mmol/L   Potassium 3.2 (L) 3.5 - 5.1 mmol/L   Chloride 98 (L) 101 - 111 mmol/L   CO2 25 22 - 32 mmol/L   Glucose, Bld 108 (H) 65 - 99 mg/dL   BUN 6 6 - 20 mg/dL    Creatinine, Ser 0.47 0.44 - 1.00 mg/dL   Calcium 8.5 (L) 8.9 - 10.3 mg/dL   Total Protein 6.8 6.5 - 8.1 g/dL   Albumin 3.9 3.5 - 5.0 g/dL   AST 22 15 - 41 U/L  ALT 13 (L) 14 - 54 U/L   Alkaline Phosphatase 48 38 - 126 U/L   Total Bilirubin 0.8 0.3 - 1.2 mg/dL   GFR calc non Af Amer >60 >60 mL/min   GFR calc Af Amer >60 >60 mL/min   Anion gap 9 5 - 15  CBC  Result Value Ref Range   WBC 7.9 4.0 - 10.5 K/uL   RBC 4.06 3.87 - 5.11 MIL/uL   Hemoglobin 13.0 12.0 - 15.0 g/dL   HCT 38.3 36.0 - 46.0 %   MCV 94.3 78.0 - 100.0 fL   MCH 32.0 26.0 - 34.0 pg   MCHC 33.9 30.0 - 36.0 g/dL   RDW 13.3 11.5 - 15.5 %   Platelets 274 150 - 400 K/uL  Protime-INR  Result Value Ref Range   Prothrombin Time 12.8 11.4 - 15.2 seconds   INR 0.96     2305:  CT scan completed, not read yet. Pt's H/H is stable. Has not vomited in ED. Did have self reported rectal bleeding when when to the bathroom. Pt's colonoscopy is for dx melena, rectal bleeding. After CT read, dispo based on results and will need to d/w GI Dr. Laural Golden regarding admit vs d/c. Sign out to Dr. Venora Maples.     Final Clinical Impressions(s) / ED Diagnoses   Final diagnoses:  None    New Prescriptions New Prescriptions   No medications on file     Francine Graven, DO 09/29/15 1722

## 2015-09-29 ENCOUNTER — Encounter (HOSPITAL_COMMUNITY)
Admission: EM | Disposition: A | Discharge status: Home / Self Care | Payer: Self-pay | Source: Home / Self Care | Attending: Emergency Medicine

## 2015-09-29 ENCOUNTER — Encounter (HOSPITAL_COMMUNITY): Payer: Self-pay

## 2015-09-29 ENCOUNTER — Ambulatory Visit (HOSPITAL_COMMUNITY): Admission: RE | Admit: 2015-09-29 | Payer: Medicare Other | Source: Ambulatory Visit | Admitting: Internal Medicine

## 2015-09-29 DIAGNOSIS — K3189 Other diseases of stomach and duodenum: Secondary | ICD-10-CM

## 2015-09-29 DIAGNOSIS — K625 Hemorrhage of anus and rectum: Secondary | ICD-10-CM | POA: Diagnosis not present

## 2015-09-29 DIAGNOSIS — K648 Other hemorrhoids: Secondary | ICD-10-CM

## 2015-09-29 DIAGNOSIS — E039 Hypothyroidism, unspecified: Secondary | ICD-10-CM | POA: Diagnosis not present

## 2015-09-29 DIAGNOSIS — R195 Other fecal abnormalities: Secondary | ICD-10-CM | POA: Diagnosis not present

## 2015-09-29 DIAGNOSIS — R109 Unspecified abdominal pain: Secondary | ICD-10-CM

## 2015-09-29 DIAGNOSIS — K228 Other specified diseases of esophagus: Secondary | ICD-10-CM

## 2015-09-29 DIAGNOSIS — R1013 Epigastric pain: Secondary | ICD-10-CM | POA: Diagnosis not present

## 2015-09-29 DIAGNOSIS — I10 Essential (primary) hypertension: Secondary | ICD-10-CM | POA: Diagnosis not present

## 2015-09-29 DIAGNOSIS — K921 Melena: Secondary | ICD-10-CM | POA: Diagnosis not present

## 2015-09-29 DIAGNOSIS — K2970 Gastritis, unspecified, without bleeding: Secondary | ICD-10-CM

## 2015-09-29 DIAGNOSIS — K449 Diaphragmatic hernia without obstruction or gangrene: Secondary | ICD-10-CM

## 2015-09-29 DIAGNOSIS — K5730 Diverticulosis of large intestine without perforation or abscess without bleeding: Secondary | ICD-10-CM

## 2015-09-29 HISTORY — PX: ESOPHAGOGASTRODUODENOSCOPY: SHX5428

## 2015-09-29 HISTORY — PX: BIOPSY: SHX5522

## 2015-09-29 HISTORY — PX: COLONOSCOPY: SHX5424

## 2015-09-29 LAB — COMPREHENSIVE METABOLIC PANEL
ALBUMIN: 3.5 g/dL (ref 3.5–5.0)
ALT: 12 U/L — ABNORMAL LOW (ref 14–54)
ANION GAP: 8 (ref 5–15)
AST: 20 U/L (ref 15–41)
Alkaline Phosphatase: 45 U/L (ref 38–126)
BILIRUBIN TOTAL: 0.5 mg/dL (ref 0.3–1.2)
BUN: 5 mg/dL — ABNORMAL LOW (ref 6–20)
CHLORIDE: 97 mmol/L — AB (ref 101–111)
CO2: 27 mmol/L (ref 22–32)
Calcium: 8.3 mg/dL — ABNORMAL LOW (ref 8.9–10.3)
Creatinine, Ser: 0.48 mg/dL (ref 0.44–1.00)
GFR calc Af Amer: >60 mL/min (ref >60)
GFR calc non Af Amer: >60 mL/min (ref >60)
GLUCOSE: 93 mg/dL (ref 65–99)
POTASSIUM: 3.4 mmol/L — AB (ref 3.5–5.1)
Sodium: 132 mmol/L — ABNORMAL LOW (ref 135–145)
TOTAL PROTEIN: 6.3 g/dL — AB (ref 6.5–8.1)

## 2015-09-29 LAB — CBC
HEMATOCRIT: 35.9 % — AB (ref 36.0–46.0)
Hemoglobin: 12.1 g/dL (ref 12.0–15.0)
MCH: 31.9 pg (ref 26.0–34.0)
MCHC: 33.7 g/dL (ref 30.0–36.0)
MCV: 94.7 fL (ref 78.0–100.0)
PLATELETS: 258 10*3/uL (ref 150–400)
RBC: 3.79 MIL/uL — ABNORMAL LOW (ref 3.87–5.11)
RDW: 13.5 % (ref 11.5–15.5)
WBC: 7.3 10*3/uL (ref 4.0–10.5)

## 2015-09-29 SURGERY — EGD (ESOPHAGOGASTRODUODENOSCOPY)
Anesthesia: Moderate Sedation

## 2015-09-29 MED ORDER — PSYLLIUM 95 % PO PACK
1.0000 | PACK | Freq: Every day | ORAL | Status: DC
  Filled 2015-09-29 (×2): qty 1

## 2015-09-29 MED ORDER — LEVETIRACETAM 500 MG PO TABS
500.0000 mg | ORAL_TABLET | Freq: Every day | ORAL | Status: DC
  Administered 2015-09-29: 500 mg via ORAL
  Filled 2015-09-29: qty 1

## 2015-09-29 MED ORDER — LEVOTHYROXINE SODIUM 50 MCG PO TABS
50.0000 ug | ORAL_TABLET | Freq: Every day | ORAL | Status: DC
  Administered 2015-09-29: 50 ug via ORAL
  Filled 2015-09-29: qty 1

## 2015-09-29 MED ORDER — ACETAMINOPHEN 325 MG PO TABS: 650.0000 mg | ORAL_TABLET | Freq: Four times a day (QID) | ORAL | Status: DC | PRN

## 2015-09-29 MED ORDER — BUTAMBEN-TETRACAINE-BENZOCAINE 2-2-14 % EX AERO
INHALATION_SPRAY | CUTANEOUS | Status: DC | PRN
  Administered 2015-09-29: 2 via TOPICAL

## 2015-09-29 MED ORDER — ALUM & MAG HYDROXIDE-SIMETH 200-200-20 MG/5ML PO SUSP
30.0000 mL | Freq: Once | ORAL | Status: AC
  Administered 2015-09-29: 30 mL via ORAL
  Filled 2015-09-29: qty 30

## 2015-09-29 MED ORDER — PANTOPRAZOLE SODIUM 40 MG PO TBEC: 40.0000 mg | DELAYED_RELEASE_TABLET | Freq: Every day | ORAL | 2 refills | Status: DC

## 2015-09-29 MED ORDER — FLECAINIDE ACETATE 100 MG PO TABS
100.0000 mg | ORAL_TABLET | Freq: Two times a day (BID) | ORAL | Status: DC
  Filled 2015-09-29 (×6): qty 1

## 2015-09-29 MED ORDER — ONDANSETRON HCL 4 MG/2ML IJ SOLN: 4.0000 mg | Freq: Four times a day (QID) | INTRAMUSCULAR | Status: DC | PRN

## 2015-09-29 MED ORDER — POTASSIUM CHLORIDE 10 MEQ/100ML IV SOLN
10.0000 meq | Freq: Once | INTRAVENOUS | Status: AC
  Administered 2015-09-29: 10 meq via INTRAVENOUS
  Filled 2015-09-29: qty 100

## 2015-09-29 MED ORDER — DOCUSATE SODIUM 100 MG PO CAPS
200.0000 mg | ORAL_CAPSULE | Freq: Every day | ORAL | Status: DC
  Filled 2015-09-29: qty 2

## 2015-09-29 MED ORDER — MECLIZINE HCL 12.5 MG PO TABS: 25.0000 mg | ORAL_TABLET | Freq: Three times a day (TID) | ORAL | Status: DC | PRN

## 2015-09-29 MED ORDER — ZOLPIDEM TARTRATE 5 MG PO TABS: 5.0000 mg | ORAL_TABLET | Freq: Every evening | ORAL | Status: DC | PRN

## 2015-09-29 MED ORDER — ACETAMINOPHEN 650 MG RE SUPP: 650.0000 mg | Freq: Four times a day (QID) | RECTAL | Status: DC | PRN

## 2015-09-29 MED ORDER — MEPERIDINE HCL 50 MG/ML IJ SOLN
INTRAMUSCULAR | Status: AC
  Filled 2015-09-29: qty 1

## 2015-09-29 MED ORDER — GABAPENTIN 100 MG PO CAPS: 100.0000 mg | ORAL_CAPSULE | Freq: Every day | ORAL | Status: DC

## 2015-09-29 MED ORDER — SODIUM CHLORIDE 0.9 % IV SOLN
INTRAVENOUS | Status: DC
  Administered 2015-09-29: 06:00:00 via INTRAVENOUS

## 2015-09-29 MED ORDER — INFLUENZA VAC SPLIT QUAD 0.5 ML IM SUSY
0.5000 mL | PREFILLED_SYRINGE | INTRAMUSCULAR | Status: DC
  Filled 2015-09-29: qty 0.5

## 2015-09-29 MED ORDER — METRONIDAZOLE IN NACL 5-0.79 MG/ML-% IV SOLN
500.0000 mg | Freq: Three times a day (TID) | INTRAVENOUS | Status: DC
  Administered 2015-09-29: 500 mg via INTRAVENOUS
  Filled 2015-09-29: qty 100

## 2015-09-29 MED ORDER — METRONIDAZOLE IN NACL 5-0.79 MG/ML-% IV SOLN
500.0000 mg | Freq: Once | INTRAVENOUS | Status: AC
  Administered 2015-09-29: 500 mg via INTRAVENOUS
  Filled 2015-09-29: qty 100

## 2015-09-29 MED ORDER — DARIFENACIN HYDROBROMIDE ER 7.5 MG PO TB24
7.5000 mg | ORAL_TABLET | Freq: Every day | ORAL | Status: DC
  Filled 2015-09-29: qty 1

## 2015-09-29 MED ORDER — ATENOLOL 25 MG PO TABS
25.0000 mg | ORAL_TABLET | Freq: Every day | ORAL | Status: DC
  Filled 2015-09-29: qty 1

## 2015-09-29 MED ORDER — SIMETHICONE 40 MG/0.6ML PO SUSP
ORAL | Status: DC | PRN
  Administered 2015-09-29: 15:00:00

## 2015-09-29 MED ORDER — CIPROFLOXACIN IN D5W 400 MG/200ML IV SOLN
400.0000 mg | Freq: Once | INTRAVENOUS | Status: AC
  Administered 2015-09-29: 400 mg via INTRAVENOUS
  Filled 2015-09-29: qty 200

## 2015-09-29 MED ORDER — ATORVASTATIN CALCIUM 20 MG PO TABS: 20.0000 mg | ORAL_TABLET | Freq: Every day | ORAL | Status: DC

## 2015-09-29 MED ORDER — PANTOPRAZOLE SODIUM 40 MG PO TBEC
40.0000 mg | DELAYED_RELEASE_TABLET | Freq: Every day | ORAL | Status: DC
  Administered 2015-09-29: 40 mg via ORAL
  Filled 2015-09-29: qty 1

## 2015-09-29 MED ORDER — ONDANSETRON HCL 4 MG PO TABS: 4.0000 mg | ORAL_TABLET | Freq: Four times a day (QID) | ORAL | Status: DC | PRN

## 2015-09-29 MED ORDER — MIDAZOLAM HCL 5 MG/5ML IJ SOLN
INTRAMUSCULAR | Status: AC
  Filled 2015-09-29: qty 10

## 2015-09-29 MED ORDER — MEPERIDINE HCL 50 MG/ML IJ SOLN
INTRAMUSCULAR | Status: DC | PRN
  Administered 2015-09-29 (×2): 25 mg via INTRAVENOUS

## 2015-09-29 MED ORDER — CIPROFLOXACIN IN D5W 400 MG/200ML IV SOLN: 400.0000 mg | Freq: Two times a day (BID) | INTRAVENOUS | Status: DC

## 2015-09-29 MED ORDER — LISINOPRIL 10 MG PO TABS
10.0000 mg | ORAL_TABLET | Freq: Every day | ORAL | Status: DC
  Filled 2015-09-29: qty 1

## 2015-09-29 MED ORDER — MIDAZOLAM HCL 5 MG/5ML IJ SOLN
INTRAMUSCULAR | Status: DC | PRN
  Administered 2015-09-29 (×6): 1 mg via INTRAVENOUS

## 2015-09-29 NOTE — Consult Note (Signed)
Referring Provider: Timmothy Euler, MD Primary Care Physician:  Kenn File, MD Primary Gastroenterologist:  Dr. Laural Golden  Reason for Consultation:    Patient with history of nausea abdominal pain melena and rectal bleeding who was scheduled to undergo EGD and colonoscopy today. She became sick and was hospitalized last night.  HPI:   Patient is 82 year old Caucasian female with multiple medical problems was seen in your office on 09/23/2015 with multiple complaints. She was experiencing nausea and burning epigastric as well as lower abdominal pain and gave history of melena lasting for 5 days. She also complained of constipation and intermittent hematochezia. Despite her symptoms she has not lost any weight. Rectal exam in the office revealed brown and guaiac positive stool. Her hemoglobin the day before was 12.5. Patient was scheduled for diagnostic EGD and colonoscopy to be performed today. Patient has been off warfarin for 5 days. Patient states she got sick after she drank 3/4th of the prep. She developed nausea vomiting and abdominal pain. She came to emergency room. WBC was 7.9 and H&H 13.0 and 38.3. Comprehensive chemistry panel was pertinent for mild hyponatremia with serum sodium of 132 and potassium of 3.4 and transaminases are normal. Patient underwent abdominopelvic CT revealing extensive colonic diverticulosis and mild haziness in sigmoid region. She was felt to diverticulitis and begun on Cipro and metronidazole. This morning she feels much better. She has not had anymore nausea and/or vomiting. She hasn't had a bowel movement this morning. She complains of mild discomfort in hypogastric region. She has not experienced fever or chills. Husband states that she has not felt well for several months. She's not had a good appetite. She has not lost any weight. She had essentially normal EGD in January 2019 prior to laparoscopic cholecystectomy. Last colonoscopy was by Dr. Delfin Edis in  February 2012 revealing moderate colonic diverticulosis internal hemorrhoids and a sessile polyp in sigmoid colon just turned out to be hyperplastic.          Past Medical History:  Diagnosis Date      . Atrial fibrillation (HCC)    Paroxysmal, Flecainide therapy  .       Marland Kitchen Cardiomegaly    EF normal, echo, 2003  //   EF 60-65%, echo, June 18, 123456, diastolic dysfunction,  . Carotid bruit    Doppler, December, 2009, no abnormality      . Diverticulosis   . GERD (gastroesophageal reflux disease)   . History of shingles   . HLD (hyperlipidemia)   . HTN (hypertension)       . Hypothyroidism   . Insomnia          . Osteoarthritis   . PONV (postoperative nausea and vomiting)   . Rectal bleeding 2001   diverticulosis and int hemorrhoids on 07/1999 and 02/2010 colonoscopies.  . Stroke ([place])   . Warfarin anticoagulation     Past Surgical History:  Procedure Laterality Date  . CATARACT EXTRACTION W/PHACO Right 04/15/2014   Procedure: CATARACT EXTRACTION PHACO AND INTRAOCULAR LENS PLACEMENT (IOC);  Surgeon: Tonny Branch, MD;  Location: [facility];  Service: Ophthalmology;  Laterality: Right;  CDE: 13.30  . CATARACT EXTRACTION W/PHACO Left 05/13/2014   Procedure: CATARACT EXTRACTION PHACO AND INTRAOCULAR LENS PLACEMENT LEFT EYE;  Surgeon: Tonny Branch, MD;  Location: [facility];  Service: Ophthalmology;  Laterality: Left;  CDE:13.00  . CHOLECYSTECTOMY N/A 01/15/2015   Procedure: LAPAROSCOPIC CHOLECYSTECTOMY;  Surgeon: Ralene Ok, MD;  Location: [place] [facility];  Service: General;  Laterality: N/A;  .  ESOPHAGOGASTRODUODENOSCOPY N/A 01/13/2015   Procedure: ESOPHAGOGASTRODUODENOSCOPY (EGD);  Surgeon: Jerene Bears, MD;  Location: [facility];  Service: Endoscopy;  Laterality: N/A;  . SHOULDER OPEN ROTATOR CUFF REPAIR     bilateral  . TOTAL KNEE ARTHROPLASTY  05/26/10   right  . TOTAL KNEE ARTHROPLASTY  11/17/2010   Procedure: TOTAL KNEE ARTHROPLASTY;  Surgeon: Mauri Pole;  Location: [facility];   Service: Orthopedics;  Laterality: Left;  . TOTAL KNEE ARTHROPLASTY     Right  . TOTAL SHOULDER REPLACEMENT  01/2010   left    Prior to Admission medications   Medication Sig Start Date End Date Taking? Authorizing Provider  atenolol (TENORMIN) 25 MG tablet Take 25 mg by mouth daily.    Yes Historical Provider, MD  atorvastatin (LIPITOR) 20 MG tablet Take 20 mg by mouth at bedtime.   Yes Historical Provider, MD  flecainide (TAMBOCOR) 100 MG tablet Take 1 tablet (100 mg total) by mouth 2 (two) times daily. 01/28/15  Yes Timmothy Euler, MD  furosemide (LASIX) 40 MG tablet Take 20 mg by mouth daily.   Yes Historical Provider, MD  gabapentin (NEURONTIN) 100 MG capsule Take 100 mg by mouth at bedtime.   Yes Historical Provider, MD  Garlic 123XX123 MG TABS Take 100 mg by mouth every other day.   Yes Historical Provider, MD  HYDROcodone-acetaminophen (NORCO/VICODIN) 5-325 MG tablet Take 0.5-1 tablets by mouth every 6 (six) hours as needed for moderate pain.   Yes Historical Provider, MD  levETIRAcetam (KEPPRA) 500 MG tablet Take 500 mg by mouth daily.    Yes Historical Provider, MD  levothyroxine (SYNTHROID, LEVOTHROID) 50 MCG tablet Take 50 mcg by mouth daily before breakfast.    Yes Historical Provider, MD  lisinopril (PRINIVIL,ZESTRIL) 10 MG tablet Take 10 mg by mouth daily.   Yes Historical Provider, MD  meclizine (ANTIVERT) 25 MG tablet Take 25 mg by mouth 3 (three) times daily as needed for dizziness.   Yes Historical Provider, MD  potassium chloride SA (K-DUR,KLOR-CON) 20 MEQ tablet Take 20 mEq by mouth daily with breakfast.    Yes Historical Provider, MD  solifenacin (VESICARE) 5 MG tablet Take 1 tablet (5 mg total) by mouth daily. 07/31/15  Yes Timmothy Euler, MD  vitamin B-12 (CYANOCOBALAMIN) 500 MCG tablet Take 500 mcg by mouth every Monday, Wednesday, and Friday.    Yes Historical Provider, MD  Vitamin D, Ergocalciferol, (DRISDOL) 50000 units CAPS capsule Take 50,000 Units by mouth every  Monday.   Yes Historical Provider, MD  warfarin (COUMADIN) 5 MG tablet Take 5 mg by mouth daily.    Yes Historical Provider, MD  zolpidem (AMBIEN) 10 MG tablet Take 5-10 mg by mouth at bedtime as needed (for insomnia).   Yes Historical Provider, MD    Current Facility-Administered Medications  Medication Dose Route Frequency Provider Last Rate Last Dose  . 0.9 %  sodium chloride infusion   Intravenous Continuous Oswald Hillock, MD 75 mL/hr at 09/29/15 0553    . acetaminophen (TYLENOL) tablet 650 mg  650 mg Oral Q6H PRN Oswald Hillock, MD       Or  . acetaminophen (TYLENOL) suppository 650 mg  650 mg Rectal Q6H PRN Oswald Hillock, MD      . atenolol (TENORMIN) tablet 25 mg  25 mg Oral Daily Oswald Hillock, MD      . atorvastatin (LIPITOR) tablet 20 mg  20 mg Oral QHS Oswald Hillock, MD      .  ciprofloxacin (CIPRO) IVPB 400 mg  400 mg Intravenous Q12H Estela Leonie Green, MD      . darifenacin (ENABLEX) 24 hr tablet 7.5 mg  7.5 mg Oral Daily Oswald Hillock, MD      . flecainide (TAMBOCOR) tablet 100 mg  100 mg Oral BID Oswald Hillock, MD      . gabapentin (NEURONTIN) capsule 100 mg  100 mg Oral QHS Oswald Hillock, MD      . Derrill Memo ON 09/30/2015] Influenza vac split quadrivalent PF (FLUARIX) injection 0.5 mL  0.5 mL Intramuscular Tomorrow-1000 Oswald Hillock, MD      . levETIRAcetam (KEPPRA) tablet 500 mg  500 mg Oral Daily Oswald Hillock, MD      . levothyroxine (SYNTHROID, LEVOTHROID) tablet 50 mcg  50 mcg Oral QAC breakfast Oswald Hillock, MD   50 mcg at 09/29/15 [id number]  . lisinopril (PRINIVIL,ZESTRIL) tablet 10 mg  10 mg Oral Daily Oswald Hillock, MD      . meclizine (ANTIVERT) tablet 25 mg  25 mg Oral TID PRN Oswald Hillock, MD      . metroNIDAZOLE (FLAGYL) IVPB 500 mg  500 mg Intravenous Q8H Oswald Hillock, MD      . ondansetron (ZOFRAN) tablet 4 mg  4 mg Oral Q6H PRN Oswald Hillock, MD       Or  . ondansetron (ZOFRAN) injection 4 mg  4 mg Intravenous Q6H PRN Oswald Hillock, MD      . zolpidem (AMBIEN) tablet 5 mg   5 mg Oral QHS PRN Oswald Hillock, MD        Allergies as of 09/28/2015 - Review Complete 09/28/2015  Allergen Reaction Noted  . Penicillins Rash and Other (See Comments)     Family History  Problem Relation Age of Onset  . COPD Brother   . Atrial fibrillation Brother   . Diabetes Brother   . Liver cancer Brother     Alcohol-related  . Colon cancer Brother   . Heart attack Father   . Rheum arthritis Mother   . Arthritis Mother   . Heart disease Mother   . Coronary artery disease Brother   . Heart attack Daughter   . Gallbladder disease Brother     Social History   Social History  . Marital status: Married    Spouse name: N/A  . Number of children: N/A  . Years of education: N/A   Occupational History  . Not on file.   Social History Main Topics  . Smoking status: Never Smoker  . Smokeless tobacco: Never Used  . Alcohol use No  . Drug use: No  . Sexual activity: No   Other Topics Concern  . Not on file   Social History Narrative  . No narrative on file    Review of Systems: See HPI, otherwise normal ROS  Physical Exam: Temp:  [97.9 F (36.6 C)-98.5 F (36.9 C)] 98.5 F (36.9 C) (09/25 0659) Pulse Rate:  [63-71] 69 (09/25 0659) Resp:  [16-18] 18 (09/25 0659) BP: (148-182)/(60-86) 148/60 (09/25 0659) SpO2:  [96 %-99 %] 96 % (09/25 0659) Weight:  [130 lb 15.3 oz (59.4 kg)-133 lb (60.3 kg)] 130 lb 15.3 oz (59.4 kg) (09/25 0148) Last BM Date: 09/29/15  Patient is alert and in no acute distress. Conjunctiva was pink. Sclerae nonicteric. Oropharyngeal mucosa is normal. She has upper and lower dentures in place. No neck masses or thyromegaly noted. Cardiac exam with irregular  rhythm normal S1 and S2. No murmur or gallop noted. Lungs are clear to auscultation. Abdomen is symmetrical. Bowel sounds are normal. On palpation abdomen is soft with mild hypogastric tenderness. No guarding or rebound noted. No organomegaly or masses. No peripheral edema or clubbing  noted.    Lab Results:  Recent Labs  09/28/15 2028 09/29/15 0503  WBC 7.9 7.3  HGB 13.0 12.1  HCT 38.3 35.9*  PLT 274 258   BMET  Recent Labs  09/28/15 2028 09/29/15 0503  NA 132* 132*  K 3.2* 3.4*  CL 98* 97*  CO2 25 27  GLUCOSE 108* 93  BUN 6 5*  CREATININE 0.47 0.48  CALCIUM 8.5* 8.3*   LFT  Recent Labs  09/29/15 0503  PROT 6.3*  ALBUMIN 3.5  AST 20  ALT 12*  ALKPHOS 45  BILITOT 0.5   PT/INR  Recent Labs  09/28/15 2028  LABPROT 12.8  INR 0.96   Hepatitis Panel No results for input(s): HEPBSAG, HCVAB, HEPAIGM, HEPBIGM in the last 72 hours.  Studies/Results: Ct Abdomen Pelvis W Contrast  Result Date: 09/28/2015 CLINICAL DATA:  82 year old female with abdominal pain EXAM: CT ABDOMEN AND PELVIS WITH CONTRAST TECHNIQUE: Multidetector CT imaging of the abdomen and pelvis was performed using the standard protocol following bolus administration of intravenous contrast. CONTRAST:  150mL ISOVUE-300 IOPAMIDOL (ISOVUE-300) INJECTION 61% COMPARISON:  Abdominal CT dated 02/06/2015 FINDINGS: Lower chest: Minimal bibasilar atelectatic changes of the lung bases. No intra-abdominal free air or free fluid. S Hepatobiliary: Cholecystectomy. The liver appears unremarkable. No intrahepatic biliary ductal dilatation. Pancreas: Unremarkable. No pancreatic ductal dilatation or surrounding inflammatory changes. Spleen: Stable physician 1.5 cm hypodense lesion she at the dome of the spleen, indeterminate, likely a cyst or hemangioma. The spleen is otherwise unremarkable. Adrenals/Urinary Tract: The adrenal glands appear unremarkable. Stable appearing 2.6 cm right renal upper pole cyst. The kidneys are otherwise unremarkable. There is no hydronephrosis on either side. The visualized ureters and urinary bladder appear unremarkable. Stomach/Bowel: Evaluation of the stomach is limited due to non opacification with oral contrast as well as respiratory motion artifact. There is a 2.4 x 2.5  cm outpouching from the posterior aspect of the gastric fundus at the gastroesophageal junction which may represent the gastric diverticulum or less likely a surgically excluded pouch. There is apparent mild edema surrounding this pouch and gastric fundus and gastroesophageal junction. A small hiatal hernia is noted. There is no evidence of gastric outlet obstruction. No small bowel obstruction identified. There is extensive sigmoid and colonic diverticulosis. There is mild haziness of the perisigmoid fat which may be chronic or represent mild acute diverticulitis. Heterogeneous density in the region of rectum likely representing a mixture of oral contrast. The appendix is normal. Vascular/Lymphatic: There is moderate aortoiliac atherosclerotic disease. No portal venous gas identified. There is no adenopathy. Reproductive: Uterus and bilateral adnexa are unremarkable. Other: None Musculoskeletal: There is osteopenia with extensive degenerative changes of the spine no acute fracture. IMPRESSION: Outpouching structure from the posterior aspect of the gastric fundus likely a gastric diverticulum with superimposed infection/ inflammation. Extensive colonic diverticulosis. Mild perisigmoid haziness may be chronic or represent mild acute diverticulitis. No bowel obstruction. Normal appendix. Electronically Signed   By: Anner Crete M.D.   On: 09/28/2015 23:15  Abdominopelvic CT reviewed.  Assessment;  Patient is 82 year old Caucasian female with multiple medical problems was seen in your office earlier this month and scheduled for diagnostic EGD and colonoscopy. She developed nausea vomiting and abdominal pain after she  took the prep and came to emergency room and felt to have diverticulitis. I do not believe patient has diverticulitis. Her acute symptoms are precipitated with GoLYTELY prep. He does not have leukocytosis or fever. She has been off warfarin and INR is at baseline. Will wait for Dr. Isaac Bliss evaluation and if she agrees with my assessment will proceed with diagnostic evaluation otherwise will delay procedures for 3-4 weeks.  Mild hyponatremia and hypokalemia secondary to diuretic therapy. Serum potassium has improved from 3.2 to 3.4.   Recommendations;  Will proceed with EGD and colonoscopy once patient evaluated by Dr. Isaac Bliss and cleared medically.   LOS: 0 days   Najeeb Rehman  09/29/2015, 9:48 AM

## 2015-09-29 NOTE — Progress Notes (Signed)
Patient seen and examined, chart reviewed. Discussed with husband at bedside. Doubt diverticulitis given lack of symptoms. Will proceed with EGD/colonoscopy as planned by Dr. Laural Golden for today. Anticipate DC later today or in am. Will Dc abx.  Domingo Mend, MD [facility] Pager: [phone]

## 2015-09-29 NOTE — Progress Notes (Signed)
Pharmacy Antibiotic Note  [name] is a 82 y.o. female admitted on 09/28/2015 with Intra-abdominal Infection .  Pharmacy has been consulted for Cipro dosing.  Plan:  Cipro 400mg  IV every 12 hours. Dose stable Sign off.  Height: 5\' 3"  (160 cm) Weight: 130 lb 15.3 oz (59.4 kg) IBW/kg (Calculated) : 52.4  Temp (24hrs), Avg:98.2 F (36.8 C), Min:97.9 F (36.6 C), Max:98.5 F (36.9 C)   Recent Labs Lab 09/28/15 2028 09/29/15 0503  WBC 7.9 7.3  CREATININE 0.47 0.48    Estimated Creatinine Clearance: 45.6 mL/min (by C-G formula based on SCr of 0.48 mg/dL).    Allergies  Allergen Reactions  . Penicillins Rash and Other (See Comments)    Has patient had a PCN reaction causing immediate rash, facial/tongue/throat swelling, SOB or lightheadedness with hypotension: No Has patient had a PCN reaction causing severe rash involving mucus membranes or skin necrosis: No Has patient had a PCN reaction that required hospitalization No Has patient had a PCN reaction occurring within the last 10 years: No If all of the above answers are "NO", then may proceed with Cephalosporin use.    Antimicrobials this admission:  Cipro 9/25 >>  Flagyl 9/25 >>   Dose adjustments this admission:  n/a   Microbiology results:  Thank you for allowing pharmacy to be a part of this patient's care.  Pricilla Larsson 09/29/2015 12:00 PM

## 2015-09-29 NOTE — Discharge Summary (Signed)
Physician Discharge Summary  [name] [id number] DOB: 02-Nov-1932 DOA: 09/28/2015  PCP: Kenn File, MD  Admit date: 09/28/2015 Discharge date: 09/29/2015  Time spent: 45 minutes  Recommendations for Outpatient Follow-up:  -Will be discharged home today. -To follow up with Dr. Laural Golden.   Discharge Diagnoses:  Active Problems:   Hypothyroidism   HTN (hypertension)   Rectal bleeding   AP (abdominal pain)   Discharge Condition: Stable and improved  Filed Weights   09/28/15 1826 09/29/15 0148 09/29/15 1338  Weight: 60.3 kg (133 lb) 59.4 kg (130 lb 15.3 oz) 59 kg (130 lb)    History of present illness:  As per Dr. Darrick Meigs on 9/25: [name]  is a 82 y.o. female, With history of atrial fibrillation, anemia, hypothyroidism, hypertension in the hospital with abdominal pain, nausea and rectal bleeding. Patient has been prepped for colonoscopy today per Dr Corbin Ade. Patient denies vomiting. No chest shortness of breath.  In the ED CT abdomen was done which showed mild diverticulitis, diverticulosis  Hospital Course:   Emesis -Doubt diverticulitis based on exam findings and CT scan. Colonoscopy confirms. -Had EGD and colonoscopy as scheduled as an OP. -OK for DC home today as per Dr. Laural Golden.  Procedures:  EGD  Colonosocpy   Consultations:  GI  Discharge Instructions  Discharge Instructions    Diet - low sodium heart healthy    Complete by:  As directed    Increase activity slowly    Complete by:  As directed        Medication List    TAKE these medications   atenolol 25 MG tablet Commonly known as:  TENORMIN Take 25 mg by mouth daily.   atorvastatin 20 MG tablet Commonly known as:  LIPITOR Take 20 mg by mouth at bedtime.   flecainide 100 MG tablet Commonly known as:  TAMBOCOR Take 1 tablet (100 mg total) by mouth 2 (two) times daily.   furosemide 40 MG tablet Commonly known as:  LASIX Take 20 mg by mouth daily.   gabapentin 100 MG  capsule Commonly known as:  NEURONTIN Take 100 mg by mouth at bedtime.   Garlic 123XX123 MG Tabs Take 100 mg by mouth every other day.   HYDROcodone-acetaminophen 5-325 MG tablet Commonly known as:  NORCO/VICODIN Take 0.5-1 tablets by mouth every 6 (six) hours as needed for moderate pain.   levETIRAcetam 500 MG tablet Commonly known as:  KEPPRA Take 500 mg by mouth daily.   levothyroxine 50 MCG tablet Commonly known as:  SYNTHROID, LEVOTHROID Take 50 mcg by mouth daily before breakfast.   lisinopril 10 MG tablet Commonly known as:  PRINIVIL,ZESTRIL Take 10 mg by mouth daily.   meclizine 25 MG tablet Commonly known as:  ANTIVERT Take 25 mg by mouth 3 (three) times daily as needed for dizziness.   pantoprazole 40 MG tablet Commonly known as:  PROTONIX Take 1 tablet (40 mg total) by mouth daily.   potassium chloride SA 20 MEQ tablet Commonly known as:  K-DUR,KLOR-CON Take 20 mEq by mouth daily with breakfast.   solifenacin 5 MG tablet Commonly known as:  VESICARE Take 1 tablet (5 mg total) by mouth daily.   vitamin B-12 500 MCG tablet Commonly known as:  CYANOCOBALAMIN Take 500 mcg by mouth every Monday, Wednesday, and Friday.   Vitamin D (Ergocalciferol) 50000 units Caps capsule Commonly known as:  DRISDOL Take 50,000 Units by mouth every Monday.   warfarin 5 MG tablet Commonly known as:  COUMADIN  Take 5 mg by mouth daily.   zolpidem 10 MG tablet Commonly known as:  AMBIEN Take 5-10 mg by mouth at bedtime as needed (for insomnia).      Allergies  Allergen Reactions  . Penicillins Rash and Other (See Comments)    Has patient had a PCN reaction causing immediate rash, facial/tongue/throat swelling, SOB or lightheadedness with hypotension: No Has patient had a PCN reaction causing severe rash involving mucus membranes or skin necrosis: No Has patient had a PCN reaction that required hospitalization No Has patient had a PCN reaction occurring within the last 10  years: No If all of the above answers are "NO", then may proceed with Cephalosporin use.   Follow-up Information    Hildred Laser, MD. Schedule an appointment as soon as possible for a visit in 2 week(s).   Specialty:  Gastroenterology Contact information: [address], [address] [place] [address] [phone]            The results of significant diagnostics from this hospitalization (including imaging, microbiology, ancillary and laboratory) are listed below for reference.    Significant Diagnostic Studies: Ct Abdomen Pelvis W Contrast  Result Date: 09/28/2015 CLINICAL DATA:  82 year old female with abdominal pain EXAM: CT ABDOMEN AND PELVIS WITH CONTRAST TECHNIQUE: Multidetector CT imaging of the abdomen and pelvis was performed using the standard protocol following bolus administration of intravenous contrast. CONTRAST:  134mL ISOVUE-300 IOPAMIDOL (ISOVUE-300) INJECTION 61% COMPARISON:  Abdominal CT dated 02/06/2015 FINDINGS: Lower chest: Minimal bibasilar atelectatic changes of the lung bases. No intra-abdominal free air or free fluid. S Hepatobiliary: Cholecystectomy. The liver appears unremarkable. No intrahepatic biliary ductal dilatation. Pancreas: Unremarkable. No pancreatic ductal dilatation or surrounding inflammatory changes. Spleen: Stable physician 1.5 cm hypodense lesion she at the dome of the spleen, indeterminate, likely a cyst or hemangioma. The spleen is otherwise unremarkable. Adrenals/Urinary Tract: The adrenal glands appear unremarkable. Stable appearing 2.6 cm right renal upper pole cyst. The kidneys are otherwise unremarkable. There is no hydronephrosis on either side. The visualized ureters and urinary bladder appear unremarkable. Stomach/Bowel: Evaluation of the stomach is limited due to non opacification with oral contrast as well as respiratory motion artifact. There is a 2.4 x 2.5 cm outpouching from the posterior aspect of the gastric fundus at the  gastroesophageal junction which may represent the gastric diverticulum or less likely a surgically excluded pouch. There is apparent mild edema surrounding this pouch and gastric fundus and gastroesophageal junction. A small hiatal hernia is noted. There is no evidence of gastric outlet obstruction. No small bowel obstruction identified. There is extensive sigmoid and colonic diverticulosis. There is mild haziness of the perisigmoid fat which may be chronic or represent mild acute diverticulitis. Heterogeneous density in the region of rectum likely representing a mixture of oral contrast. The appendix is normal. Vascular/Lymphatic: There is moderate aortoiliac atherosclerotic disease. No portal venous gas identified. There is no adenopathy. Reproductive: Uterus and bilateral adnexa are unremarkable. Other: None Musculoskeletal: There is osteopenia with extensive degenerative changes of the spine no acute fracture. IMPRESSION: Outpouching structure from the posterior aspect of the gastric fundus likely a gastric diverticulum with superimposed infection/ inflammation. Extensive colonic diverticulosis. Mild perisigmoid haziness may be chronic or represent mild acute diverticulitis. No bowel obstruction. Normal appendix. Electronically Signed   By: Anner Crete M.D.   On: 09/28/2015 23:15    Microbiology: No results found for this or any previous visit (from the past 240 hour(s)).   Labs: Basic Metabolic Panel:  Recent Labs Lab 09/28/15 2028 09/29/15 0503  NA 132* 132*  K 3.2* 3.4*  CL 98* 97*  CO2 25 27  GLUCOSE 108* 93  BUN 6 5*  CREATININE 0.47 0.48  CALCIUM 8.5* 8.3*   Liver Function Tests:  Recent Labs Lab 09/28/15 2028 09/29/15 0503  AST 22 20  ALT 13* 12*  ALKPHOS 48 45  BILITOT 0.8 0.5  PROT 6.8 6.3*  ALBUMIN 3.9 3.5    Recent Labs Lab 09/28/15 2028  LIPASE 21   No results for input(s): AMMONIA in the last 168 hours. CBC:  Recent Labs Lab 09/28/15 2028  09/29/15 0503  WBC 7.9 7.3  HGB 13.0 12.1  HCT 38.3 35.9*  MCV 94.3 94.7  PLT 274 258   Cardiac Enzymes: No results for input(s): CKTOTAL, CKMB, CKMBINDEX, TROPONINI in the last 168 hours. BNP: BNP (last 3 results)  Recent Labs  01/12/15 0122  BNP 273.1*    ProBNP (last 3 results) No results for input(s): PROBNP in the last 8760 hours.  CBG: No results for input(s): GLUCAP in the last 168 hours.     SignedLelon Frohlich  [facility] Pager: [phone] 09/29/2015, 4:35 PM

## 2015-09-29 NOTE — Op Note (Signed)
[facility] Patient Name: [name] Procedure Date: 09/29/2015 2:56 PM MRN: [id number] Date of Birth: [birth year] Attending MD: Hildred Laser , MD CSN: [id number] Age: 82 Admit Type: Inpatient Procedure:                Colonoscopy Indications:              Gastrointestinal occult blood loss, Rectal bleeding Providers:                Hildred Laser, MD, Gwenlyn Fudge RN, RN, Randa Spike, Technician Referring MD:             Sherley Bounds. Wendi Snipes, MD Medicines:                Midazolam 2 mg IV Complications:            No immediate complications. Estimated Blood Loss:     Estimated blood loss: none. Procedure:                Pre-Anesthesia Assessment:                           - Prior to the procedure, a History and Physical                            was performed, and patient medications and                            allergies were reviewed. The patient's tolerance of                            previous anesthesia was also reviewed. The risks                            and benefits of the procedure and the sedation                            options and risks were discussed with the patient.                            All questions were answered, and informed consent                            was obtained. Prior Anticoagulants: The patient                            last took Coumadin (warfarin) 5 days prior to the                            procedure. ASA Grade Assessment: III - A patient                            with severe systemic disease. After reviewing the  risks and benefits, the patient was deemed in                            satisfactory condition to undergo the procedure.                           After obtaining informed consent, the colonoscope                            was passed under direct vision. Throughout the                            procedure, the patient's blood pressure, pulse, and               oxygen saturations were monitored continuously. The                            [id number]) scope was introduced through                            the anus and advanced to the the cecum, identified                            by appendiceal orifice and ileocecal valve. The                            colonoscopy was performed without difficulty. The                            patient tolerated the procedure well. The quality                            of the bowel preparation was adequate to identify                            polyps. The ileocecal valve, appendiceal orifice,                            and rectum were photographed. Scope In: 3:04:17 PM Scope Out: 3:18:49 PM Scope Withdrawal Time: 0 hours 6 minutes 42 seconds  Total Procedure Duration: 0 hours 14 minutes 32 seconds  Findings:      The perianal and digital rectal examinations were normal.      Multiple medium-mouthed diverticula were found in the entire colon.      Internal hemorrhoids were found during retroflexion. The hemorrhoids       were medium-sized. Impression:               - Diverticulosis in the entire examined colon.                           - Internal hemorrhoids.                           - No specimens collected.  comment: Suspect hematochezia secondary to                            hemorrhoids.                           if patient experiences melena with drop in H&H will                            consider small bowel evaluation. Moderate Sedation:      Moderate (conscious) sedation was administered by the endoscopy nurse       and supervised by the endoscopist. The following parameters were       monitored: oxygen saturation, heart rate, blood pressure, CO2       capnography and response to care. Total physician intraservice time was       22 minutes. Recommendation:           - Return patient to hospital ward for possible                            discharge  same day.                           - Resume previous diet today.                           - Continue present medications.                           - Use sugar-free Metamucil one tablespoon PO daily.                           - Colace capsule(s) orally 200 mg daily.                           - No repeat colonoscopy due to age. Procedure Code(s):        --- Professional ---                           [phone], Colonoscopy, flexible; diagnostic, including                            collection of specimen(s) by brushing or washing,                            when performed (separate procedure)                           99152, Moderate sedation services provided by the                            same physician or other qualified health care                            professional performing the diagnostic or  therapeutic service that the sedation supports,                            requiring the presence of an independent trained                            observer to assist in the monitoring of the                            patient's level of consciousness and physiological                            status; initial 15 minutes of intraservice time,                            patient age 46 years or older Diagnosis Code(s):        --- Professional ---                           K64.8, Other hemorrhoids                           R19.5, Other fecal abnormalities                           K62.5, Hemorrhage of anus and rectum                           K57.30, Diverticulosis of large intestine without                            perforation or abscess without bleeding CPT copyright 2016 American Medical Association. All rights reserved. The codes documented in this report are preliminary and upon coder review may  be revised to meet current compliance requirements. Hildred Laser, MD Hildred Laser, MD 09/29/2015 3:38:07 PM This report has been signed electronically. Number  of Addenda: 0

## 2015-09-29 NOTE — ED Provider Notes (Signed)
Possible acute diverticulitis. Will admit overnight for serial cbc's and abx.    Jola Schmidt, MD 09/29/15 [phone]

## 2015-09-29 NOTE — H&P (Addendum)
TRH H&P    Patient Demographics:    [name], is a 82 y.o. female  MRN: [id number]  DOB - Feb 22, 1932  Admit Date - 09/28/2015  Referring MD/NP/PA: Jola Schmidt  Outpatient Primary MD for the patient is Kenn File, MD  Patient coming from: home  Chief Complaint  Patient presents with  . Emesis  . Rectal Bleeding      HPI:    [name]  is a 82 y.o. female, With history of atrial fibrillation, anemia, hypothyroidism, hypertension in the hospital with abdominal pain, nausea and rectal bleeding. Patient has been prepped for colonoscopy today per Dr Corbin Ade. Patient denies vomiting. No chest shortness of breath.  In the ED CT abdomen was done which showed mild diverticulitis, diverticulosis    Review of systems:    In addition to the HPI above,  No Fever-chills, No Headache, No changes with Vision or hearing, No problems swallowing food or Liquids, No Chest pain, Cough or Shortness of Breath, No Blood in stool or Urine, No dysuria, No new skin rashes or bruises, No new joints pains-aches,  No new weakness, tingling, numbness in any extremity, No recent weight gain or loss, No polyuria, polydypsia or polyphagia, No significant Mental Stressors.  A full 10 point Review of Systems was done, except as stated above, all other Review of Systems were negative.   With Past History of the following :    Past Medical History:  Diagnosis Date  . Anemia   . Atrial fibrillation (HCC)    Paroxysmal, Flecainide therapy  . Calf pain    September, 2012, at rest  . Cardiomegaly    EF normal, echo, 2003  //   EF 60-65%, echo, June 18, 123456, diastolic dysfunction,  . Carotid bruit    Doppler, December, 2009, no abnormality  . Cataract   . Diverticulosis   . GERD (gastroesophageal reflux disease)   . History of shingles   . HLD (hyperlipidemia)   . HTN (hypertension)   . Hypothyroidism   .  Hypothyroidism   . Insomnia   . Normal nuclear stress test    Normal, 2001  . Osteoarthritis   . PONV (postoperative nausea and vomiting)   . Rectal bleeding 2001   diverticulosis and int hemorrhoids on 07/1999 and 02/2010 colonoscopies.  . Stroke ([place])   . Warfarin anticoagulation       Past Surgical History:  Procedure Laterality Date  . CATARACT EXTRACTION W/PHACO Right 04/15/2014   Procedure: CATARACT EXTRACTION PHACO AND INTRAOCULAR LENS PLACEMENT (IOC);  Surgeon: Tonny Branch, MD;  Location: [facility];  Service: Ophthalmology;  Laterality: Right;  CDE: 13.30  . CATARACT EXTRACTION W/PHACO Left 05/13/2014   Procedure: CATARACT EXTRACTION PHACO AND INTRAOCULAR LENS PLACEMENT LEFT EYE;  Surgeon: Tonny Branch, MD;  Location: [facility];  Service: Ophthalmology;  Laterality: Left;  CDE:13.00  . CHOLECYSTECTOMY N/A 01/15/2015   Procedure: LAPAROSCOPIC CHOLECYSTECTOMY;  Surgeon: Ralene Ok, MD;  Location: [place];  Service: General;  Laterality: N/A;  . ESOPHAGOGASTRODUODENOSCOPY N/A 01/13/2015   Procedure: ESOPHAGOGASTRODUODENOSCOPY (EGD);  Surgeon: Jerene Bears, MD;  Location: [facility];  Service: Endoscopy;  Laterality: N/A;  . SHOULDER OPEN ROTATOR CUFF REPAIR     bilateral  . TOTAL KNEE ARTHROPLASTY  05/26/10   right  . TOTAL KNEE ARTHROPLASTY  11/17/2010   Procedure: TOTAL KNEE ARTHROPLASTY;  Surgeon: Mauri Pole;  Location: [facility];  Service: Orthopedics;  Laterality: Left;  . TOTAL KNEE ARTHROPLASTY     Right  . TOTAL SHOULDER REPLACEMENT  01/2010   left      Social History:      Social History  Substance Use Topics  . Smoking status: Never Smoker  . Smokeless tobacco: Never Used  . Alcohol use No       Family History :     Family History  Problem Relation Age of Onset  . COPD Brother   . Atrial fibrillation Brother   . Diabetes Brother   . Liver cancer Brother     Alcohol-related  . Colon cancer Brother   . Heart attack Father   . Rheum arthritis Mother   .  Arthritis Mother   . Heart disease Mother   . Coronary artery disease Brother   . Heart attack Daughter   . Gallbladder disease Brother       Home Medications:   Prior to Admission medications   Medication Sig Start Date End Date Taking? Authorizing Provider  atenolol (TENORMIN) 25 MG tablet Take 25 mg by mouth daily.    Yes Historical Provider, MD  atorvastatin (LIPITOR) 20 MG tablet Take 20 mg by mouth at bedtime.   Yes Historical Provider, MD  flecainide (TAMBOCOR) 100 MG tablet Take 1 tablet (100 mg total) by mouth 2 (two) times daily. 01/28/15  Yes Timmothy Euler, MD  furosemide (LASIX) 40 MG tablet Take 20 mg by mouth daily.   Yes Historical Provider, MD  gabapentin (NEURONTIN) 100 MG capsule Take 100 mg by mouth at bedtime.   Yes Historical Provider, MD  Garlic [id number] MG TABS Take 100 mg by mouth every other day.   Yes Historical Provider, MD  HYDROcodone-acetaminophen (NORCO/VICODIN) 5-325 MG tablet Take 0.5-1 tablets by mouth every 6 (six) hours as needed for moderate pain.   Yes Historical Provider, MD  levETIRAcetam (KEPPRA) 500 MG tablet Take 500 mg by mouth daily.    Yes Historical Provider, MD  levothyroxine (SYNTHROID, LEVOTHROID) 50 MCG tablet Take 50 mcg by mouth daily before breakfast.    Yes Historical Provider, MD  lisinopril (PRINIVIL,ZESTRIL) 10 MG tablet Take 10 mg by mouth daily.   Yes Historical Provider, MD  meclizine (ANTIVERT) 25 MG tablet Take 25 mg by mouth 3 (three) times daily as needed for dizziness.   Yes Historical Provider, MD  potassium chloride SA (K-DUR,KLOR-CON) 20 MEQ tablet Take 20 mEq by mouth daily with breakfast.    Yes Historical Provider, MD  solifenacin (VESICARE) 5 MG tablet Take 1 tablet (5 mg total) by mouth daily. 07/31/15  Yes Timmothy Euler, MD  vitamin B-12 (CYANOCOBALAMIN) 500 MCG tablet Take 500 mcg by mouth every Monday, Wednesday, and Friday.    Yes Historical Provider, MD  Vitamin D, Ergocalciferol, (DRISDOL) 50000 units CAPS  capsule Take 50,000 Units by mouth every Monday.   Yes Historical Provider, MD  warfarin (COUMADIN) 5 MG tablet Take 5 mg by mouth daily.    Yes Historical Provider, MD  zolpidem (AMBIEN) 10 MG tablet Take 5-10 mg by mouth at bedtime as needed (for insomnia).   Yes  Historical Provider, MD     Allergies:     Allergies  Allergen Reactions  . Penicillins Rash and Other (See Comments)    Has patient had a PCN reaction causing immediate rash, facial/tongue/throat swelling, SOB or lightheadedness with hypotension: No Has patient had a PCN reaction causing severe rash involving mucus membranes or skin necrosis: No Has patient had a PCN reaction that required hospitalization No Has patient had a PCN reaction occurring within the last 10 years: No If all of the above answers are "NO", then may proceed with Cephalosporin use.     Physical Exam:   Vitals  Blood pressure (!) 179/78, pulse 70, temperature 98.1 F (36.7 C), temperature source Oral, resp. rate 18, height 5\' 3"  (1.6 m), weight 59.4 kg (130 lb 15.3 oz), SpO2 98 %.  1.  General: Appears in no acute distress  2. Psychiatric:  Intact judgement and  insight, awake alert, oriented x 3.  3. Neurologic: No focal neurological deficits, all cranial nerves intact.Strength 5/5 all 4 extremities, sensation intact all 4 extremities, plantars down going.  4. Eyes :  anicteric sclerae, moist conjunctivae with no lid lag. PERRLA.  5. ENMT:  Oropharynx clear with moist mucous membranes and good dentition  6. Neck:  supple, no cervical lymphadenopathy appriciated, No thyromegaly  7. Respiratory : Normal respiratory effort, good air movement bilaterally,clear to  auscultation bilaterally  8. Cardiovascular : RRR, no gallops, rubs or murmurs, no leg edema  9. Gastrointestinal:  Positive bowel sounds, abdomen soft, mild generalized tenderness to palpation,no hepatosplenomegaly, no rigidity or guarding       10. Skin:  No cyanosis,  normal texture and turgor, no rash, lesions or ulcers  11.Musculoskeletal:  Good muscle tone,  joints appear normal , no effusions,  normal range of motion    Data Review:    CBC  Recent Labs Lab 09/28/15 2028  WBC 7.9  HGB 13.0  HCT 38.3  PLT 274  MCV 94.3  MCH 32.0  MCHC 33.9  RDW 13.3   ------------------------------------------------------------------------------------------------------------------  Chemistries   Recent Labs Lab 09/28/15 2028  NA 132*  K 3.2*  CL 98*  CO2 25  GLUCOSE 108*  BUN 6  CREATININE 0.47  CALCIUM 8.5*  AST 22  ALT 13*  ALKPHOS 48  BILITOT 0.8   ------------------------------------------------------------------------------------------------------------------  ------------------------------------------------------------------------------------------------------------------ GFR: Estimated Creatinine Clearance: 45.6 mL/min (by C-G formula based on SCr of 0.47 mg/dL). Liver Function Tests:  Recent Labs Lab 09/28/15 2028  AST 22  ALT 13*  ALKPHOS 48  BILITOT 0.8  PROT 6.8  ALBUMIN 3.9    Recent Labs Lab 09/28/15 2028  LIPASE 21   No results for input(s): AMMONIA in the last 168 hours. Coagulation Profile:  Recent Labs Lab 09/28/15 2028  INR 0.96    --------------------------------------------------------------------------------------------------------------- Urine analysis:    Component Value Date/Time   COLORURINE YELLOW 09/28/2015 2255   APPEARANCEUR CLEAR 09/28/2015 2255   APPEARANCEUR Cloudy (A) 07/31/2015 0923   LABSPEC 1.010 09/28/2015 2255   PHURINE 7.5 09/28/2015 2255   GLUCOSEU NEGATIVE 09/28/2015 2255   HGBUR TRACE (A) 09/28/2015 2255   BILIRUBINUR NEGATIVE 09/28/2015 2255   BILIRUBINUR Negative 07/31/2015 0923   KETONESUR NEGATIVE 09/28/2015 2255   PROTEINUR NEGATIVE 09/28/2015 2255   UROBILINOGEN 0.2 11/09/2010 1235   NITRITE NEGATIVE 09/28/2015 2255   LEUKOCYTESUR NEGATIVE 09/28/2015 2255    LEUKOCYTESUR Trace (A) 07/31/2015 0923      Imaging Results:    Ct Abdomen Pelvis W Contrast  Result  Date: 09/28/2015 CLINICAL DATA:  82 year old female with abdominal pain EXAM: CT ABDOMEN AND PELVIS WITH CONTRAST TECHNIQUE: Multidetector CT imaging of the abdomen and pelvis was performed using the standard protocol following bolus administration of intravenous contrast. CONTRAST:  172mL ISOVUE-300 IOPAMIDOL (ISOVUE-300) INJECTION 61% COMPARISON:  Abdominal CT dated 02/06/2015 FINDINGS: Lower chest: Minimal bibasilar atelectatic changes of the lung bases. No intra-abdominal free air or free fluid. S Hepatobiliary: Cholecystectomy. The liver appears unremarkable. No intrahepatic biliary ductal dilatation. Pancreas: Unremarkable. No pancreatic ductal dilatation or surrounding inflammatory changes. Spleen: Stable physician 1.5 cm hypodense lesion she at the dome of the spleen, indeterminate, likely a cyst or hemangioma. The spleen is otherwise unremarkable. Adrenals/Urinary Tract: The adrenal glands appear unremarkable. Stable appearing 2.6 cm right renal upper pole cyst. The kidneys are otherwise unremarkable. There is no hydronephrosis on either side. The visualized ureters and urinary bladder appear unremarkable. Stomach/Bowel: Evaluation of the stomach is limited due to non opacification with oral contrast as well as respiratory motion artifact. There is a 2.4 x 2.5 cm outpouching from the posterior aspect of the gastric fundus at the gastroesophageal junction which may represent the gastric diverticulum or less likely a surgically excluded pouch. There is apparent mild edema surrounding this pouch and gastric fundus and gastroesophageal junction. A small hiatal hernia is noted. There is no evidence of gastric outlet obstruction. No small bowel obstruction identified. There is extensive sigmoid and colonic diverticulosis. There is mild haziness of the perisigmoid fat which may be chronic or represent mild  acute diverticulitis. Heterogeneous density in the region of rectum likely representing a mixture of oral contrast. The appendix is normal. Vascular/Lymphatic: There is moderate aortoiliac atherosclerotic disease. No portal venous gas identified. There is no adenopathy. Reproductive: Uterus and bilateral adnexa are unremarkable. Other: None Musculoskeletal: There is osteopenia with extensive degenerative changes of the spine no acute fracture. IMPRESSION: Outpouching structure from the posterior aspect of the gastric fundus likely a gastric diverticulum with superimposed infection/ inflammation. Extensive colonic diverticulosis. Mild perisigmoid haziness may be chronic or represent mild acute diverticulitis. No bowel obstruction. Normal appendix. Electronically Signed   By: Anner Crete M.D.   On: 09/28/2015 23:15       Assessment & Plan:    Active Problems:   Hypothyroidism   HTN (hypertension)   Rectal bleeding   1. Acute diverticulitis- placed under  observation: Start Cipro and Flagyl. Keep nothing by mouth, IV fluids. [facility] consult gastroenterology in a.m. 2. Rectal bleeding- likely from diverticulosis, H&H stable at 13.0. We will monitor. 3. Hypokalemia- replace potassium and check BMP in a.m. 4. Hypertension- blood pressure is controlled, continue atenolol, lisinopril. 5. History of atrial fibrillation- heart rate is controlled, continue flecainide. Coumadin has been on hold for past 5 days.  6. Hypothyroidism- continue Synthroid 7. Seizure disorder- continue Keppra   DVT Prophylaxis-    SCDs   AM Labs Ordered, also please review Full Orders  Family Communication: Admission, patients condition and plan of care including tests being ordered have been discussed with the patient and  who indicate understanding and agree with the plan and Code Status.  Code Status:  Full code  Admission status: Observation    Time spent in minutes : 55 minutes   LAMA,GAGAN S M.D on 09/29/2015  at 2:28 AM  Between 7am to 7pm - Pager - [phone]. After 7pm go to [email or web address] - password [facility]  [facility] - Office  [phone]

## 2015-09-29 NOTE — Care Management Obs Status (Signed)
[place] NOTIFICATION   Patient Details  Name: [name] MRN: [id number] Date of Birth: 01-25-1932   Medicare Observation Status Notification Given:  Yes    Sherald Barge, RN 09/29/2015, 12:32 PM

## 2015-09-29 NOTE — Care Management Note (Signed)
Case Management Note  Patient Details  Name: [name] MRN: [id number] Date of Birth: 1932-11-28  Subjective/Objective:                  Pt admitted with rectal bleeding. She is ind with ALD's. She is from home, lives with her husband and is ind with ADL's. She has PCP, transportation and no difficulty affording medications. She plans to return home with self care.   Action/Plan: No needs anticipated.   Expected Discharge Date:    09/29/2015              Expected Discharge Plan:  Home/Self Care  In-House Referral:  NA  Discharge planning Services  CM Consult  Post Acute Care Choice:  NA Choice offered to:  NA  DME Arranged:    DME Agency:     HH Arranged:    HH Agency:     Status of Service:  Completed, signed off  If discussed at [employer] of Stay Meetings, dates discussed:    Additional Comments:  Sherald Barge, RN 09/29/2015, 12:33 PM

## 2015-09-29 NOTE — Op Note (Addendum)
[facility] Patient Name: [name] Procedure Date: 09/29/2015 2:34 PM MRN: [id number] Date of Birth: [birth year] Attending MD: Hildred Laser , MD CSN: [id number] Age: 82 Admit Type: Inpatient Procedure:                Upper GI endoscopy Indications:              Epigastric abdominal pain, Melena Providers:                Hildred Laser, MD, Otis Peak B. Sharon Seller, RN, Randa Spike, Technician Referring MD:             Sherley Bounds. Wendi Snipes, MD Medicines:                Cetacaine spray, Meperidine 50 mg IV, Midazolam 4                            mg IV Complications:            No immediate complications. Estimated Blood Loss:     Estimated blood loss was minimal. Procedure:                Pre-Anesthesia Assessment:                           - Prior to the procedure, a History and Physical                            was performed, and patient medications and                            allergies were reviewed. The patient's tolerance of                            previous anesthesia was also reviewed. The risks                            and benefits of the procedure and the sedation                            options and risks were discussed with the patient.                            All questions were answered, and informed consent                            was obtained. Prior Anticoagulants: The patient                            last took Coumadin (warfarin) 5 days prior to the                            procedure. ASA Grade Assessment: III - A patient  with severe systemic disease. After reviewing the                            risks and benefits, the patient was deemed in                            satisfactory condition to undergo the procedure.                           After obtaining informed consent, the endoscope was                            passed under direct vision. Throughout the                             procedure, the patient's blood pressure, pulse, and                            oxygen saturations were monitored continuously. The                            [id number]) scope was introduced through the                            mouth, and advanced to the second part of duodenum.                            The upper GI endoscopy was accomplished without                            difficulty. The patient tolerated the procedure                            well. Scope In: 2:48:46 PM Scope Out: 2:56:29 PM Total Procedure Duration: 0 hours 7 minutes 43 seconds  Findings:      The examined esophagus was normal.      The Z-line was irregular and was found 32 cm from the incisors.      A 4 cm hiatal hernia was present.      A few, small non-bleeding erosions were found in the gastric antrum.       There were no stigmata of recent bleeding.      Patchy mild inflammation characterized by congestion (edema), erythema       and friability was found in the gastric antrum. Biopsies were taken with       a cold forceps for histology.      The exam of the stomach was otherwise normal.      The duodenal bulb and second portion of the duodenum were normal. Impression:               - Normal esophagus.                           - Z-line irregular, 32 cm from the incisors.                           -  4 cm hiatal hernia.                           - Non-bleeding erosive gastropathy.                           - Gastritis. Biopsied.                           - Normal duodenal bulb and second portion of the                            duodenum. Moderate Sedation:      Moderate (conscious) sedation was administered by the endoscopy nurse       and supervised by the endoscopist. The following parameters were       monitored: oxygen saturation, heart rate, blood pressure, CO2       capnography and response to care. Total physician intraservice time was       15 minutes. Recommendation:           -  Return patient to hospital ward for ongoing care.                           - Resume previous diet today.                           - Continue present medications.                           - pantoprazole 40 mg by mouth every morning.                           - See the other procedure note for documentation of                            additional recommendations. Procedure Code(s):        --- Professional ---                           [phone], Esophagogastroduodenoscopy, flexible,                            transoral; with biopsy, single or multiple                           99152, Moderate sedation services provided by the                            same physician or other qualified health care                            professional performing the diagnostic or                            therapeutic service that the sedation supports,  requiring the presence of an independent trained                            observer to assist in the monitoring of the                            patient's level of consciousness and physiological                            status; initial 15 minutes of intraservice time,                            patient age 48 years or older Diagnosis Code(s):        --- Professional ---                           K22.8, Other specified diseases of esophagus                           K44.9, Diaphragmatic hernia without obstruction or                            gangrene                           K31.89, Other diseases of stomach and duodenum                           K29.70, Gastritis, unspecified, without bleeding                           R10.13, Epigastric pain                           K92.1, Melena (includes Hematochezia) CPT copyright 2016 American Medical Association. All rights reserved. The codes documented in this report are preliminary and upon coder review may  be revised to meet current compliance requirements. Hildred Laser,  MD Hildred Laser, MD 09/29/2015 3:31:48 PM This report has been signed electronically. Number of Addenda: 0

## 2015-09-30 NOTE — Progress Notes (Signed)
Discharge instructions given on medications,and follow up visits,patient verbalized understanding.Prescriptions sent with patient.Accompanied by staff to an awaiting vehicle. 

## 2015-10-01 ENCOUNTER — Telehealth: Payer: Self-pay | Admitting: *Deleted

## 2015-10-01 NOTE — Telephone Encounter (Signed)
Multiple attempts to contact pt for TCM appt Number busy x multiple

## 2015-10-02 ENCOUNTER — Ambulatory Visit (INDEPENDENT_AMBULATORY_CARE_PROVIDER_SITE_OTHER): Payer: Medicare Other | Admitting: Pharmacist

## 2015-10-02 DIAGNOSIS — I480 Paroxysmal atrial fibrillation: Secondary | ICD-10-CM | POA: Diagnosis not present

## 2015-10-02 DIAGNOSIS — Z79899 Other long term (current) drug therapy: Secondary | ICD-10-CM | POA: Diagnosis not present

## 2015-10-02 LAB — COAGUCHEK XS/INR WAIVED
INR: 1 (ref 0.9–1.1)
PROTHROMBIN TIME: 12 s

## 2015-10-02 MED ORDER — ZOLPIDEM TARTRATE 5 MG PO TABS: 2.5000 mg | ORAL_TABLET | Freq: Every evening | ORAL | 0 refills | Status: DC | PRN

## 2015-10-02 NOTE — Patient Instructions (Signed)
Stop garlic supplement  Will ask Dr Wendi Snipes about changing sleep medication to something less likely to cause falls or memory changes.

## 2015-10-02 NOTE — Progress Notes (Signed)
Patient ID: [name], female   DOB: [birth year], 82 y.o.   MRN: [id number]   Subjective:    CC:  Anticoagulation follow up and medication review  HPI:  [name] was recently hospitalized with melena and abdominal pain.  She underwent colonoscopy and endoscopy. She has appt with GI in 2 weeks to discuss results.  While in hospital pantoprazole was started.  She just restarted warfarin yesterday.  Patient would like her meds reviewed because she thinks she is taking too mich medication.     Indication: atrial fibrillation Bleeding signs/symptoms: Yes - rectal bleeding but none since endo/colonoscopy Thromboembolic signs/symptoms: None  Missed Coumadin doses: no warfarin for 4 days until restarted yesterday Medication changes: yes - gabapentin and levetiracetem have been started with in the last months.  Also as noted above pantoprazole started after endoscopy Dietary changes: no Bacterial/viral infection: no  Outpatient Encounter Prescriptions as of 10/02/2015  Medication Sig  . atenolol (TENORMIN) 25 MG tablet Take 25 mg by mouth daily.   Marland Kitchen atorvastatin (LIPITOR) 20 MG tablet Take 20 mg by mouth at bedtime.  . flecainide (TAMBOCOR) 100 MG tablet Take 1 tablet (100 mg total) by mouth 2 (two) times daily.  . furosemide (LASIX) 40 MG tablet Take 20 mg by mouth daily.  Marland Kitchen gabapentin (NEURONTIN) 100 MG capsule Take 100 mg by mouth at bedtime.  Marland Kitchen HYDROcodone-acetaminophen (NORCO/VICODIN) 5-325 MG tablet Take 0.5-1 tablets by mouth every 6 (six) hours as needed for moderate pain.  Marland Kitchen levETIRAcetam (KEPPRA) 500 MG tablet Take 500 mg by mouth daily.   Marland Kitchen levothyroxine (SYNTHROID, LEVOTHROID) 50 MCG tablet Take 50 mcg by mouth daily before breakfast.   . lisinopril (PRINIVIL,ZESTRIL) 10 MG tablet Take 10 mg by mouth daily.  . meclizine (ANTIVERT) 25 MG tablet Take 25 mg by mouth 3 (three) times daily as needed for dizziness.  . pantoprazole (PROTONIX) 40 MG tablet Take 1 tablet (40 mg total) by  mouth daily.  . potassium chloride SA (K-DUR,KLOR-CON) 20 MEQ tablet Take 20 mEq by mouth daily with breakfast.   . solifenacin (VESICARE) 5 MG tablet Take 1 tablet (5 mg total) by mouth daily.  . vitamin B-12 (CYANOCOBALAMIN) 500 MCG tablet Take 500 mcg by mouth every Monday, Wednesday, and Friday.   . Vitamin D, Ergocalciferol, (DRISDOL) 50000 units CAPS capsule Take 50,000 Units by mouth every Monday.  . warfarin (COUMADIN) 5 MG tablet Take 5 mg by mouth daily.   Marland Kitchen zolpidem (AMBIEN) 10 MG tablet Take 5-10 mg by mouth at bedtime as needed (for insomnia).  .  Garlic [id number] MG TABS Take 100 mg by mouth every other day.   No facility-administered encounter medications on file as of 10/02/2015.     Objective:    INR Today: 1.1   Assessment:    Subtherapeutic INR for goal of 2-3   Medication review - patient is taking serveral meds that can increase falls and affect cognition.  Discussed with her PCP and plan for med changes below.  Plan:    1. New dose: take warfarin 5mg  1 and 1/2 tablet for next 2 days, then restart usual dose of 5mg  qd.   2. Next INR: 1 week 3.  D/c Garlic 4.  Decrease zolpiem to 5mg  take 1/2 to 1 tablet at bedtime for sleep.  Will try to taper off. At next appt with try either trazodone, melatonin or Belsomnra or increase gabapentin.

## 2015-10-03 DIAGNOSIS — M2041 Other hammer toe(s) (acquired), right foot: Secondary | ICD-10-CM | POA: Diagnosis not present

## 2015-10-03 DIAGNOSIS — M21612 Bunion of left foot: Secondary | ICD-10-CM | POA: Diagnosis not present

## 2015-10-03 DIAGNOSIS — L851 Acquired keratosis [keratoderma] palmaris et plantaris: Secondary | ICD-10-CM | POA: Diagnosis not present

## 2015-10-03 DIAGNOSIS — M2042 Other hammer toe(s) (acquired), left foot: Secondary | ICD-10-CM | POA: Diagnosis not present

## 2015-10-06 ENCOUNTER — Ambulatory Visit (INDEPENDENT_AMBULATORY_CARE_PROVIDER_SITE_OTHER): Payer: Medicare Other

## 2015-10-06 DIAGNOSIS — Z23 Encounter for immunization: Secondary | ICD-10-CM | POA: Diagnosis not present

## 2015-10-07 ENCOUNTER — Encounter (HOSPITAL_COMMUNITY): Payer: Self-pay | Admitting: Internal Medicine

## 2015-10-08 ENCOUNTER — Other Ambulatory Visit: Payer: Self-pay | Admitting: Family Medicine

## 2015-10-08 ENCOUNTER — Encounter: Payer: Self-pay | Admitting: *Deleted

## 2015-10-09 ENCOUNTER — Ambulatory Visit (INDEPENDENT_AMBULATORY_CARE_PROVIDER_SITE_OTHER): Payer: Medicare Other | Admitting: Pharmacist

## 2015-10-09 DIAGNOSIS — I480 Paroxysmal atrial fibrillation: Secondary | ICD-10-CM

## 2015-10-09 LAB — COAGUCHEK XS/INR WAIVED
INR: 2.6 — AB (ref 0.9–1.1)
Prothrombin Time: 30.7 s

## 2015-10-09 NOTE — Telephone Encounter (Signed)
Please call in ambien with 1 refills 

## 2015-10-09 NOTE — Patient Instructions (Signed)
Continue to take warfarin 5mg  - take 1 tablet every day.  Call me when they give you date for surgery - I can given you instructions about holding warfarin prior to surgery.

## 2015-10-09 NOTE — Progress Notes (Signed)
Patient ID: [name], female   DOB: 1932-05-06, 82 y.o.   MRN: [id number]   Subjective:    CC:  Anticoagulation follow up and medication review  HPI:  [name] was recently hospitalized with melena and abdominal pain.  She underwent colonoscopy and endoscopy. She has appt with GI 11/04/2015 to discuss results.  She just restarted warfarin 8 days ago At last visit zolpidem was decreased due to concerns that was affecting mental function.  Patient states she continues to sleep well with lower dose - take 2.5mg  zolpidem. She also reports being able to "think more clearly and wake up easier"     Indication: atrial fibrillation Bleeding signs/symptoms: Not since last week's visit Thromboembolic signs/symptoms: None  Missed Coumadin doses: not since last week Medication changes: no Dietary changes: no Bacterial/viral infection: no  Outpatient Encounter Prescriptions as of 10/02/2015  Medication Sig  . atenolol (TENORMIN) 25 MG tablet Take 25 mg by mouth daily.   Marland Kitchen atorvastatin (LIPITOR) 20 MG tablet Take 20 mg by mouth at bedtime.  . flecainide (TAMBOCOR) 100 MG tablet Take 1 tablet (100 mg total) by mouth 2 (two) times daily.  . furosemide (LASIX) 40 MG tablet Take 20 mg by mouth daily.  Marland Kitchen gabapentin (NEURONTIN) 100 MG capsule Take 100 mg by mouth at bedtime.  Marland Kitchen HYDROcodone-acetaminophen (NORCO/VICODIN) 5-325 MG tablet Take 0.5-1 tablets by mouth every 6 (six) hours as needed for moderate pain.  Marland Kitchen levETIRAcetam (KEPPRA) 500 MG tablet Take 500 mg by mouth daily.   Marland Kitchen levothyroxine (SYNTHROID, LEVOTHROID) 50 MCG tablet Take 50 mcg by mouth daily before breakfast.   . lisinopril (PRINIVIL,ZESTRIL) 10 MG tablet Take 10 mg by mouth daily.  . meclizine (ANTIVERT) 25 MG tablet Take 25 mg by mouth 3 (three) times daily as needed for dizziness.  . pantoprazole (PROTONIX) 40 MG tablet Take 1 tablet (40 mg total) by mouth daily.  . potassium chloride SA (K-DUR,KLOR-CON) 20 MEQ tablet Take 20 mEq  by mouth daily with breakfast.   . solifenacin (VESICARE) 5 MG tablet Take 1 tablet (5 mg total) by mouth daily.  . vitamin B-12 (CYANOCOBALAMIN) 500 MCG tablet Take 500 mcg by mouth every Monday, Wednesday, and Friday.   . Vitamin D, Ergocalciferol, (DRISDOL) 50000 units CAPS capsule Take 50,000 Units by mouth every Monday.  . warfarin (COUMADIN) 5 MG tablet Take 5 mg by mouth daily.   Marland Kitchen zolpidem (AMBIEN) 5 MG tablet Take 2.5 - 5 mg by mouth at bedtime as needed (for insomnia).       No facility-administered encounter medications on file as of 10/02/2015.     Objective:    INR Today: 2.6   Assessment:    Subtherapeutic INR for goal of 2-3   Medication review   Plan:    1. New dose: continue warfarin 5mg  qd.   2. Next INR: 1 week 3.  Continue with zolpiem to 5mg  take 1/2 to 1 tablet at bedtime for sleep.  Will try to stop at the end of this month.  If patient needs something for sleep in the future then try either trazodone, melatonin or Belsomnra or increase gabapentin.

## 2015-10-09 NOTE — Telephone Encounter (Signed)
[place] aware of refill yesterday by Cherre Robins, PharmD & Dr. Wendi Snipes.

## 2015-10-10 ENCOUNTER — Encounter: Payer: Self-pay | Admitting: Family Medicine

## 2015-10-10 ENCOUNTER — Ambulatory Visit (INDEPENDENT_AMBULATORY_CARE_PROVIDER_SITE_OTHER): Payer: Medicare Other | Admitting: Family Medicine

## 2015-10-10 VITALS — BP 165/73 | HR 62 | Temp 97.0°F | Ht 63.0 in | Wt 133.4 lb

## 2015-10-10 DIAGNOSIS — M25562 Pain in left knee: Secondary | ICD-10-CM

## 2015-10-10 DIAGNOSIS — G8929 Other chronic pain: Secondary | ICD-10-CM | POA: Diagnosis not present

## 2015-10-10 DIAGNOSIS — Z01818 Encounter for other preprocedural examination: Secondary | ICD-10-CM | POA: Diagnosis not present

## 2015-10-10 DIAGNOSIS — M25561 Pain in right knee: Secondary | ICD-10-CM | POA: Diagnosis not present

## 2015-10-10 DIAGNOSIS — E876 Hypokalemia: Secondary | ICD-10-CM

## 2015-10-10 MED ORDER — TRAMADOL HCL 50 MG PO TABS: 25.0000 mg | ORAL_TABLET | Freq: Three times a day (TID) | ORAL | 0 refills | Status: DC | PRN

## 2015-10-10 NOTE — Patient Instructions (Addendum)
Great to see you!  I will work with your surgeon to see  If you need cardiac clearance based on th esedation needs

## 2015-10-10 NOTE — Progress Notes (Signed)
   HPI  Patient presents today here for pre-op eval for an orthopedic proceure and for pain medications  Pt states that she's been offered surgery for removal of her left second toe for chronic ulcer She is unsure that she will need general anesthesia, she believes that she'll only have IV sedation.  She cannot necessarily perform 4 metastases of exercise without dyspnea. However she denies any chest pain. She does have A. fib, however it's well controlled and asymptomatic.   PMH: Smoking status noted ROS: Per HPI  Objective: BP (!) 165/73   Pulse 62   Temp 97 F (36.1 C) (Oral)   Ht 5\' 3"  (1.6 m)   Wt 133 lb 6.4 oz (60.5 kg)   BMI 23.63 kg/m  Gen: NAD, alert, cooperative with exam HEENT: NCAT CV: RRR, good S1/S2, no murmur Resp: CTABL, no wheezes, non-labored Ext: No edema, warm Neuro: Alert and oriented, No gross deficits  Assessment and plan:  # pre-op eval Patient with history of CVA, current A. fib however rate controlled, with some question whether or not she can tolerate for mets of activity. If she needs general anesthesia, I would recommend cardiology evaluation I think she would be appropriate for IV sedation.   # Chronic knee pain. Helped well by tramadol. One half to one tramadol up to 3 times daily, #30 prescribed Caution with her age and also taking Ambien for sleep  # Hyperkalemia Mild, however should be corrected before any procedures. Repeat BMP    Orders Placed This Encounter  Procedures  . Basic metabolic panel    Order Specific Question:   Has the patient fasted?    Answer:   No  . CBC with Differential  . EKG 12-Lead    Meds ordered this encounter  Medications  . traMADol (ULTRAM) 50 MG tablet    Sig: Take 0.5-1 tablets (25-50 mg total) by mouth every 8 (eight) hours as needed.    Dispense:  30 tablet    Refill:  0    Laroy Apple, MD [facility] 10/10/2015, 3:39 PM

## 2015-10-11 LAB — BASIC METABOLIC PANEL
BUN/Creatinine Ratio: 14 (ref 12–28)
BUN: 10 mg/dL (ref 8–27)
CHLORIDE: 91 mmol/L — AB (ref 96–106)
CO2: 31 mmol/L — AB (ref 18–29)
CREATININE: 0.72 mg/dL (ref 0.57–1.00)
Calcium: 9 mg/dL (ref 8.7–10.3)
GFR calc Af Amer: 91 mL/min/{1.73_m2} (ref >59)
GFR, EST NON AFRICAN AMERICAN: 79 mL/min/{1.73_m2} (ref >59)
GLUCOSE: 78 mg/dL (ref 65–99)
POTASSIUM: 4.1 mmol/L (ref 3.5–5.2)
SODIUM: 134 mmol/L (ref 134–144)

## 2015-10-11 LAB — CBC WITH DIFFERENTIAL/PLATELET
BASOS ABS: 0 10*3/uL (ref 0.0–0.2)
Basos: 1 %
EOS (ABSOLUTE): 0.1 10*3/uL (ref 0.0–0.4)
Eos: 2 %
HEMATOCRIT: 36.6 % (ref 34.0–46.6)
Hemoglobin: 12.1 g/dL (ref 11.1–15.9)
Immature Grans (Abs): 0 10*3/uL (ref 0.0–0.1)
Immature Granulocytes: 0 %
LYMPHS ABS: 1.8 10*3/uL (ref 0.7–3.1)
Lymphs: 26 %
MCH: 31 pg (ref 26.6–33.0)
MCHC: 33.1 g/dL (ref 31.5–35.7)
MCV: 94 fL (ref 79–97)
MONOS ABS: 0.8 10*3/uL (ref 0.1–0.9)
Monocytes: 12 %
Neutrophils Absolute: 4 10*3/uL (ref 1.4–7.0)
Neutrophils: 59 %
PLATELETS: 337 10*3/uL (ref 150–379)
RBC: 3.9 x10E6/uL (ref 3.77–5.28)
RDW: 14 % (ref 12.3–15.4)
WBC: 6.8 10*3/uL (ref 3.4–10.8)

## 2015-10-13 ENCOUNTER — Ambulatory Visit (INDEPENDENT_AMBULATORY_CARE_PROVIDER_SITE_OTHER): Payer: Medicare Other | Admitting: Internal Medicine

## 2015-10-13 ENCOUNTER — Telehealth: Payer: Self-pay | Admitting: *Deleted

## 2015-10-13 ENCOUNTER — Encounter (INDEPENDENT_AMBULATORY_CARE_PROVIDER_SITE_OTHER): Payer: Self-pay | Admitting: Internal Medicine

## 2015-10-13 VITALS — BP 180/70 | HR 56 | Temp 97.7°F | Ht 63.0 in | Wt 134.5 lb

## 2015-10-13 DIAGNOSIS — K648 Other hemorrhoids: Secondary | ICD-10-CM

## 2015-10-13 MED ORDER — HYDROCORTISONE ACE-PRAMOXINE 1-1 % RE FOAM: 1.0000 | Freq: Two times a day (BID) | RECTAL | 2 refills | Status: DC

## 2015-10-13 NOTE — Telephone Encounter (Signed)
Requesting surgical clearance:   1. Type of surgery: Left II toe: Left 2nd toe amputation  2. Surgeon: Wylene Simmer  3. Surgical date: Pending   4. Medications that need to be help: Warfarin  5.  Orthopaedics: Mamie Nick) [phone] (F) [phone]  Pt saw you last on 05/17, Is pt cleared for surgery?

## 2015-10-13 NOTE — Patient Instructions (Addendum)
Call me Wednesday with a progress report.  Stool softener

## 2015-10-13 NOTE — Progress Notes (Signed)
Subjective:    Patient ID: [name], female    DOB: 02/16/1932, 82 y.o.   MRN: [id number]  Presents today with c/o hemorrhoidal pain. She was last seen in September for melena and rectal bleeding. She tells me she has hemorrhoids. Hemorrhoids are painful. Recent colonoscopy revealed internal hemorrhoids. She is very tender in her rectal area. Symptoms every since she had her colonoscopy.   09/29/2015 EGD/Colonoscopy: EGD; Impression:               - Normal esophagus.                           - Z-line irregular, 32 cm from the incisors.                           - 4 cm hiatal hernia.                            - Non-bleeding erosive gastropathy.                           - Gastritis. Biopsied.                           - Normal duodenal bulb and second portion of the                            duodenum.  Gastric biopsy negative for infection or allergy. Colonoscopy:    Impression:               - Diverticulosis in the entire examined colon.                           - Internal hemorrhoids.                           - No specimens collected.                           comment: Suspect hematochezia secondary to                            hemorrhoids.                           if patient experiences melena with drop in H&H will                            consider small bowel evaluation.   Hx of atrial fib and maintained on Warfarin.  09/22/2015 Hemoglobin 12.5 09/18/2015 Hemoglobin 10.2.  HPI 01/13/2015 EGD: epigastric pain, nausea and vomiting: Dr. Lajuan Lines. Pyrtle. The mucosa of the esophagus appeared normal. $cm hiatal hernia. The mucosa of the stomach appeared normal. The duodenal mucosa showed no abnormalities in the bulb and 2nd part of the duodenum.  Colonoscopy Dr. Maurene Capes 2 /06/2010:abdominal pain, anemia, rectal bleeding.  internal hemorrhoids, moderate diverticulosis, sessile polyp in the sigmoid colon. Biopsy:Benign small bowel mucosa. No villous atrophy, inflammation or  other abnormalities     Review of Systems  Past Medical History:  Diagnosis Date  . Anemia   . Atrial fibrillation (HCC)    Paroxysmal, Flecainide therapy  . Calf pain    September, 2012, at rest  . Cardiomegaly    EF normal, echo, 2003  //   EF 60-65%, echo, June 18, 123456, diastolic dysfunction,  . Carotid bruit    Doppler, December, 2009, no abnormality  . Cataract   . Diverticulosis   . GERD (gastroesophageal reflux disease)   . History of shingles   . HLD (hyperlipidemia)   . HTN (hypertension)   . Hypothyroidism   . Hypothyroidism   . Insomnia   . Normal nuclear stress test    Normal, 2001  . Osteoarthritis   . PONV (postoperative nausea and vomiting)   . Rectal bleeding 2001   diverticulosis and int hemorrhoids on 07/1999 and 02/2010 colonoscopies.  . Stroke ([place])   . Warfarin anticoagulation     Past Surgical History:  Procedure Laterality Date  . BIOPSY  09/29/2015   Procedure: BIOPSY;  Surgeon: Rogene Houston, MD;  Location: [facility];  Service: Endoscopy;;  gastric  . CATARACT EXTRACTION W/PHACO Right 04/15/2014   Procedure: CATARACT EXTRACTION PHACO AND INTRAOCULAR LENS PLACEMENT (IOC);  Surgeon: Tonny Branch, MD;  Location: [facility];  Service: Ophthalmology;  Laterality: Right;  CDE: 13.30  . CATARACT EXTRACTION W/PHACO Left 05/13/2014   Procedure: CATARACT EXTRACTION PHACO AND INTRAOCULAR LENS PLACEMENT LEFT EYE;  Surgeon: Tonny Branch, MD;  Location: [facility];  Service: Ophthalmology;  Laterality: Left;  CDE:13.00  . CHOLECYSTECTOMY N/A 01/15/2015   Procedure: LAPAROSCOPIC CHOLECYSTECTOMY;  Surgeon: Ralene Ok, MD;  Location: [facility];  Service: General;  Laterality: N/A;  . COLONOSCOPY N/A 09/29/2015   Procedure: COLONOSCOPY;  Surgeon: Rogene Houston, MD;  Location: [facility];  Service: Endoscopy;  Laterality: N/A;  . ESOPHAGOGASTRODUODENOSCOPY N/A 01/13/2015   Procedure: ESOPHAGOGASTRODUODENOSCOPY (EGD);  Surgeon: Jerene Bears, MD;  Location[facility] END[facility];   Service: Endoscopy;  Laterality: N/A;  . ESOPHAGOGASTRODUODENOSCOPY N/A 09/29/2015   Procedure: ESOPHAGOGASTRODUODENOSCOPY (EGD);  Surgeon: Rogene Houston, MD;  Location: [facility];  Service: Endoscopy;  Laterality: N/A;  12:15  . SHOULDER OPEN ROTATOR CUFF REPAIR     bilateral  . TOTAL KNEE ARTHROPLASTY  05/26/10   right  . TOTAL KNEE ARTHROPLASTY  11/17/2010   Procedure: TOTAL KNEE ARTHROPLASTY;  Surgeon: Mauri Pole;  Location: [facility];  Service: Orthopedics;  Laterality: Left;  . TOTAL KNEE ARTHROPLASTY     Right  . TOTAL SHOULDER REPLACEMENT  01/2010   left    Allergies  Allergen Reactions  . Penicillins Rash and Other (See Comments)    Has patient had a PCN reaction causing immediate rash, facial/tongue/throat swelling, SOB or lightheadedness with hypotension: No Has patient had a PCN reaction causing severe rash involving mucus membranes or skin necrosis: No Has patient had a PCN reaction that required hospitalization No Has patient had a PCN reaction occurring within the last 10 years: No If all of the above answers are "NO", then may proceed with Cephalosporin use.    Current Outpatient Prescriptions on File Prior to Visit  Medication Sig Dispense Refill  . atenolol (TENORMIN) 25 MG tablet Take 25 mg by mouth daily.     Marland Kitchen atorvastatin (LIPITOR) 20 MG tablet Take 20 mg by mouth at bedtime.    . flecainide (TAMBOCOR) 100 MG tablet Take 1 tablet (100 mg total) by mouth 2 (two) times daily. 60 tablet 0  .  furosemide (LASIX) 40 MG tablet Take 20 mg by mouth daily.    Marland Kitchen gabapentin (NEURONTIN) 100 MG capsule Take 100 mg by mouth at bedtime.    Marland Kitchen HYDROcodone-acetaminophen (NORCO/VICODIN) 5-325 MG tablet Take 0.5-1 tablets by mouth every 6 (six) hours as needed for moderate pain.    Marland Kitchen levETIRAcetam (KEPPRA) 500 MG tablet Take 500 mg by mouth daily.     Marland Kitchen levothyroxine (SYNTHROID, LEVOTHROID) 50 MCG tablet Take 50 mcg by mouth daily before breakfast.     . lisinopril  (PRINIVIL,ZESTRIL) 10 MG tablet Take 10 mg by mouth daily.    . pantoprazole (PROTONIX) 40 MG tablet Take 1 tablet (40 mg total) by mouth daily. 30 tablet 2  . potassium chloride SA (K-DUR,KLOR-CON) 20 MEQ tablet Take 20 mEq by mouth daily with breakfast.     . solifenacin (VESICARE) 5 MG tablet Take 1 tablet (5 mg total) by mouth daily. 30 tablet 3  . traMADol (ULTRAM) 50 MG tablet Take 0.5-1 tablets (25-50 mg total) by mouth every 8 (eight) hours as needed. 30 tablet 0  . vitamin B-12 (CYANOCOBALAMIN) 500 MCG tablet Take 500 mcg by mouth every Monday, Wednesday, and Friday.     . Vitamin D, Ergocalciferol, (DRISDOL) 50000 units CAPS capsule Take 50,000 Units by mouth every Monday.    . warfarin (COUMADIN) 5 MG tablet Take 5 mg by mouth daily.     Marland Kitchen zolpidem (AMBIEN) 5 MG tablet TAKE 1/2 TO 1 TABLET AT BEDTIME AS NEEDED FOR INSOMNIA 15 tablet 1   No current facility-administered medications on file prior to visit.        Objective:   Physical Exam Blood pressure (!) 180/70, pulse (!) 56, temperature 97.7 F (36.5 C), height 5\' 3"  (1.6 m), weight 134 lb 8 oz (61 kg).  Alert and oriented. Skin warm and dry. Oral mucosa is moist.   . Sclera anicteric, conjunctivae is pink. Thyroid not enlarged. No cervical lymphadenopathy. Lungs clear. Heart regular rate and rhythm.  Abdomen is soft. Bowel sounds are positive. No hepatomegaly. No abdominal masses felt. No tenderness.  No edema to lower extremities.   Protruding hemorrhoids versus possible rectal prolapse. Beefy red in color.       Assessment & Plan:  Protruding hemorrhoids   Am going to start her on Proctofoam. She will call with a PR report Wednesday.  Stool softener daily

## 2015-10-13 NOTE — Telephone Encounter (Signed)
OK to hold warfarin starting 5 days prior to surgery.  Dr Wendi Snipes her PCP has referred her for cardio evaluation prior to surgery.  I am scheduled to see [name] 10/22/2015.  Will discuss plan to hold warfarin at that visit unless amputation is schedule prior to 10/22/2015 in which can we will call her with recommendations for holding warfarin.

## 2015-10-14 NOTE — Telephone Encounter (Signed)
Clearance faxed via epic and faxed machine 

## 2015-10-15 ENCOUNTER — Telehealth (INDEPENDENT_AMBULATORY_CARE_PROVIDER_SITE_OTHER): Payer: Self-pay | Admitting: Internal Medicine

## 2015-10-15 NOTE — Telephone Encounter (Signed)
Patient called per Terri's request, she is still having problems with her hemorrhoids.  [phone]

## 2015-10-15 NOTE — Telephone Encounter (Signed)
[facility]. Call me with PR Friday

## 2015-10-16 ENCOUNTER — Other Ambulatory Visit: Payer: Self-pay | Admitting: Family Medicine

## 2015-10-16 NOTE — Telephone Encounter (Signed)
The patient had a negative perfusion study earlier this year.  She is at acceptable risk for surgery.  No further imaging is indicated.

## 2015-10-17 ENCOUNTER — Telehealth: Payer: Self-pay | Admitting: Family Medicine

## 2015-10-17 ENCOUNTER — Telehealth: Payer: Self-pay | Admitting: Cardiovascular Disease

## 2015-10-17 ENCOUNTER — Other Ambulatory Visit: Payer: Self-pay | Admitting: Family Medicine

## 2015-10-17 ENCOUNTER — Encounter: Payer: Medicare Other | Admitting: Cardiovascular Disease

## 2015-10-17 MED ORDER — FLECAINIDE ACETATE 100 MG PO TABS: 100.0000 mg | ORAL_TABLET | Freq: Two times a day (BID) | ORAL | 0 refills | Status: DC

## 2015-10-17 NOTE — Telephone Encounter (Signed)
Called to clarify dose with patient who states she normally takes a 1/2 tablet but will take a whole pill if necessary for additional swelling. Spoke with mail order pharmacy and instructed them to leave the directions the same as rxd. They verbalized understanding

## 2015-10-17 NOTE — Telephone Encounter (Signed)
done

## 2015-10-20 ENCOUNTER — Other Ambulatory Visit: Payer: Self-pay

## 2015-10-20 ENCOUNTER — Telehealth (INDEPENDENT_AMBULATORY_CARE_PROVIDER_SITE_OTHER): Payer: Self-pay | Admitting: Internal Medicine

## 2015-10-20 MED ORDER — ATORVASTATIN CALCIUM 20 MG PO TABS: 20.0000 mg | ORAL_TABLET | Freq: Every day | ORAL | 0 refills | Status: DC

## 2015-10-20 NOTE — Telephone Encounter (Signed)
Patient called for [name] to give a progress report, stated that she's feeling as well as she was last Wednesday, maybe a little better.  [phone]

## 2015-10-20 NOTE — Telephone Encounter (Signed)
I have spoken with patient 

## 2015-10-21 ENCOUNTER — Telehealth: Payer: Self-pay | Admitting: Cardiovascular Disease

## 2015-10-21 NOTE — Telephone Encounter (Signed)
Received records from [facility] for appointment on 10/29/15 with Dr Gwenlyn Found.  Records given to [facility] (medical records) for Dr Kennon Holter schedule on 10/29/15. lp

## 2015-10-22 ENCOUNTER — Ambulatory Visit (INDEPENDENT_AMBULATORY_CARE_PROVIDER_SITE_OTHER): Payer: Medicare Other | Admitting: Pharmacist

## 2015-10-22 DIAGNOSIS — I480 Paroxysmal atrial fibrillation: Secondary | ICD-10-CM

## 2015-10-22 LAB — COAGUCHEK XS/INR WAIVED
INR: 4.1 — ABNORMAL HIGH (ref 0.9–1.1)
Prothrombin Time: 48.8 s

## 2015-10-28 ENCOUNTER — Ambulatory Visit (INDEPENDENT_AMBULATORY_CARE_PROVIDER_SITE_OTHER): Payer: Medicare Other | Admitting: Pharmacist

## 2015-10-28 ENCOUNTER — Telehealth: Payer: Self-pay | Admitting: Pharmacist

## 2015-10-28 DIAGNOSIS — I480 Paroxysmal atrial fibrillation: Secondary | ICD-10-CM | POA: Diagnosis not present

## 2015-10-28 LAB — COAGUCHEK XS/INR WAIVED
INR: 2.9 — ABNORMAL HIGH (ref 0.9–1.1)
Prothrombin Time: 34.6 s

## 2015-10-28 MED ORDER — SOLIFENACIN SUCCINATE 10 MG PO TABS: 10.0000 mg | ORAL_TABLET | Freq: Every day | ORAL | 1 refills | Status: DC

## 2015-10-28 MED ORDER — SOLIFENACIN SUCCINATE 10 MG PO TABS
10.0000 mg | ORAL_TABLET | Freq: Every day | ORAL | 0 refills | Status: DC
Start: 2015-10-28 — End: 2015-11-04

## 2015-10-28 MED ORDER — TRAMADOL HCL 50 MG PO TABS: 25.0000 mg | ORAL_TABLET | Freq: Three times a day (TID) | ORAL | 0 refills | Status: DC | PRN

## 2015-10-28 NOTE — Telephone Encounter (Signed)
[place] with refill. Will ask to see if she has had toe removed.   Laroy Apple, MD [facility] 10/28/2015, 1:07 PM

## 2015-10-28 NOTE — Telephone Encounter (Signed)
Tramadol prescription called in to [facility].  Patient has not had her toe amputated yet, does go to see surgeon tomorrow.

## 2015-10-28 NOTE — Progress Notes (Signed)
Patient ID: KARLYE DIMMOCK, female   DOB: [birth year], 82 y.o.   MRN: [id number]   Subjective:    CC:  Anticoagulation follow up and medication review  HPI:  Mrs. Klugman is here today to recheck INR.  She also is requesting increase dose of vesicare and refill on tramadol.      Indication for anitcoagulation: atrial fibrillation Bleeding signs/symptoms: Not since viist Thromboembolic signs/symptoms: None  Missed Coumadin doses: not since last week Medication changes: no Dietary changes: no Bacterial/viral infection: no  Outpatient Encounter Prescriptions as of 10/02/2015  Medication Sig  . atenolol (TENORMIN) 25 MG tablet Take 25 mg by mouth daily.   Marland Kitchen atorvastatin (LIPITOR) 20 MG tablet Take 20 mg by mouth at bedtime.  . flecainide (TAMBOCOR) 100 MG tablet Take 1 tablet (100 mg total) by mouth 2 (two) times daily.  . furosemide (LASIX) 40 MG tablet Take 20 mg by mouth daily.  Marland Kitchen gabapentin (NEURONTIN) 100 MG capsule Take 100 mg by mouth at bedtime.  Marland Kitchen HYDROcodone-acetaminophen (NORCO/VICODIN) 5-325 MG tablet Take 0.5-1 tablets by mouth every 6 (six) hours as needed for moderate pain.  Marland Kitchen levETIRAcetam (KEPPRA) 500 MG tablet Take 500 mg by mouth daily.   Marland Kitchen levothyroxine (SYNTHROID, LEVOTHROID) 50 MCG tablet Take 50 mcg by mouth daily before breakfast.   . lisinopril (PRINIVIL,ZESTRIL) 10 MG tablet Take 10 mg by mouth daily.  . meclizine (ANTIVERT) 25 MG tablet Take 25 mg by mouth 3 (three) times daily as needed for dizziness.  . pantoprazole (PROTONIX) 40 MG tablet Take 1 tablet (40 mg total) by mouth daily.  . potassium chloride SA (K-DUR,KLOR-CON) 20 MEQ tablet Take 20 mEq by mouth daily with breakfast.   . solifenacin (VESICARE) 5 MG tablet Take 1 tablet (5 mg total) by mouth daily.  . vitamin B-12 (CYANOCOBALAMIN) 500 MCG tablet Take 500 mcg by mouth every Monday, Wednesday, and Friday.   . Vitamin D, Ergocalciferol, (DRISDOL) 50000 units CAPS capsule Take 50,000 Units by mouth every  Monday.  . warfarin (COUMADIN) 5 MG tablet Take 5 mg by mouth daily.   Marland Kitchen zolpidem (AMBIEN) 5 MG tablet Take 2.5 - 5 mg by mouth at bedtime as needed (for insomnia).       No facility-administered encounter medications on file as of 10/02/2015.     Objective:    INR Today: 2.6   Assessment:    Subtherapeutic INR for goal of 2-3   Medication review   Plan:    1. New dose: continue warfarin 5mg  qd except 2.5mg  mondays and fridays  2. Next INR: 1 week 3.  Increase vesicare to 10mg  daily - discussed with patient possible side effects and to monitor for changes in memory / concentration. 4.  Refill request for tramadol sent to her pcp.  .  Patient ID: YAIZA ROMANSKI, female   DOB: October 26, 1932, 82 y.o.   MRN: [id number]

## 2015-10-29 ENCOUNTER — Ambulatory Visit (INDEPENDENT_AMBULATORY_CARE_PROVIDER_SITE_OTHER): Payer: Medicare Other | Admitting: Cardiovascular Disease

## 2015-10-29 ENCOUNTER — Encounter: Payer: Self-pay | Admitting: Cardiovascular Disease

## 2015-10-29 VITALS — BP 158/80 | HR 57 | Ht 63.0 in | Wt 135.0 lb

## 2015-10-29 DIAGNOSIS — L97501 Non-pressure chronic ulcer of other part of unspecified foot limited to breakdown of skin: Secondary | ICD-10-CM | POA: Diagnosis not present

## 2015-10-29 DIAGNOSIS — Z01818 Encounter for other preprocedural examination: Secondary | ICD-10-CM

## 2015-10-29 NOTE — Progress Notes (Signed)
10/29/2015 [name]   [birth year]  [id number]  Primary Physician Kenn File, MD Primary Cardiologist: Lorretta Harp MD [name]  HPI:  [name] is a delightful 82 year old thin-appearing Caucasian female mother of one living child (1 deceased), grandmother and 3 grandchildren referred by Dr. Doran Durand, orthopedic surgeon, for peripheral vascular evaluation because of a hammertoe which needs surgery. Her cardiologist is Dr. Percival Spanish. She has a history of hypertension, hyperlipidemia and proximally to fibrillation on Coumadin anticoagulation. She's never had a heart attack or stroke. She denies claudication.   Current Outpatient Prescriptions  Medication Sig Dispense Refill  . atenolol (TENORMIN) 25 MG tablet Take 25 mg by mouth daily.     Marland Kitchen atorvastatin (LIPITOR) 20 MG tablet Take 1 tablet (20 mg total) by mouth at bedtime. 90 tablet 0  . flecainide (TAMBOCOR) 100 MG tablet Take 1 tablet (100 mg total) by mouth 2 (two) times daily. 180 tablet 0  . furosemide (LASIX) 40 MG tablet Take 20 mg by mouth daily.    Marland Kitchen gabapentin (NEURONTIN) 100 MG capsule Take 100 mg by mouth at bedtime.    Marland Kitchen HYDROcodone-acetaminophen (NORCO/VICODIN) 5-325 MG tablet Take 0.5-1 tablets by mouth every 6 (six) hours as needed for moderate pain.    . hydrocortisone-pramoxine (PROCTOFOAM HC) rectal foam Place 1 applicator rectally 2 (two) times daily. 10 g 2  . levETIRAcetam (KEPPRA) 500 MG tablet Take 500 mg by mouth daily.     Marland Kitchen levothyroxine (SYNTHROID, LEVOTHROID) 50 MCG tablet Take 50 mcg by mouth daily before breakfast.     . lisinopril (PRINIVIL,ZESTRIL) 10 MG tablet Take 10 mg by mouth daily.    . pantoprazole (PROTONIX) 40 MG tablet Take 1 tablet (40 mg total) by mouth daily. 30 tablet 2  . potassium chloride SA (K-DUR,KLOR-CON) 20 MEQ tablet Take 20 mEq by mouth daily with breakfast.     . solifenacin (VESICARE) 10 MG tablet Take 1 tablet (10 mg total) by mouth daily. 30 tablet  1  . solifenacin (VESICARE) 10 MG tablet Take 1 tablet (10 mg total) by mouth daily. 90 tablet 0  . traMADol (ULTRAM) 50 MG tablet Take 0.5-1 tablets (25-50 mg total) by mouth every 8 (eight) hours as needed. 30 tablet 0  . vitamin B-12 (CYANOCOBALAMIN) 500 MCG tablet Take 500 mcg by mouth every Monday, Wednesday, and Friday.     . Vitamin D, Ergocalciferol, (DRISDOL) 50000 units CAPS capsule Take 50,000 Units by mouth every Monday.    . warfarin (COUMADIN) 5 MG tablet Take 5 mg by mouth daily.     Marland Kitchen zolpidem (AMBIEN) 5 MG tablet TAKE 1/2 TO 1 TABLET AT BEDTIME AS NEEDED FOR INSOMNIA 15 tablet 1   No current facility-administered medications for this visit.     Allergies  Allergen Reactions  . Penicillins Rash and Other (See Comments)    Has patient had a PCN reaction causing immediate rash, facial/tongue/throat swelling, SOB or lightheadedness with hypotension: No Has patient had a PCN reaction causing severe rash involving mucus membranes or skin necrosis: No Has patient had a PCN reaction that required hospitalization No Has patient had a PCN reaction occurring within the last 10 years: No If all of the above answers are "NO", then may proceed with Cephalosporin use.    Social History   Social History  . Marital status: Married    Spouse name: N/A  . Number of children: N/A  . Years of education: N/A   Occupational  History  . Not on file.   Social History Main Topics  . Smoking status: Never Smoker  . Smokeless tobacco: Never Used  . Alcohol use No  . Drug use: No  . Sexual activity: No   Other Topics Concern  . Not on file   Social History Narrative  . No narrative on file     Review of Systems: General: negative for chills, fever, night sweats or weight changes.  Cardiovascular: negative for chest pain, dyspnea on exertion, edema, orthopnea, palpitations, paroxysmal nocturnal dyspnea or shortness of breath Dermatological: negative for rash Respiratory: negative  for cough or wheezing Urologic: negative for hematuria Abdominal: negative for nausea, vomiting, diarrhea, bright red blood per rectum, melena, or hematemesis Neurologic: negative for visual changes, syncope, or dizziness All other systems reviewed and are otherwise negative except as noted above.    Blood pressure (!) 158/80, pulse (!) 57, height 5\' 3"  (1.6 m), weight 135 lb (61.2 kg).  General appearance: alert and no distress Neck: no adenopathy, no carotid bruit, no JVD, supple, symmetrical, trachea midline and thyroid not enlarged, symmetric, no tenderness/mass/nodules Lungs: clear to auscultation bilaterally Heart: regular rate and rhythm, S1, S2 normal, no murmur, click, rub or gallop Extremities: extremities normal, atraumatic, no cyanosis or edema and Palpable pedal pulses  EKG not performed today  ASSESSMENT AND PLAN:   Chronic ulcer of toe [facility]) [name] has a hammertoe on the left. She seen Dr. Doran Durand with anticipation of potential surgery and was referred here for vascular clearance. She denies claudication. She does have palpable pedal pulses. I'm going to get lower extremity arterial Dopplers to further evaluate her circulatory status.      Lorretta Harp MD FACP,FACC,FAHA, [facility] 10/29/2015 12:05 PM

## 2015-10-29 NOTE — Assessment & Plan Note (Signed)
[name] has a hammertoe on the left. She seen Dr. Doran Durand with anticipation of potential surgery and was referred here for vascular clearance. She denies claudication. She does have palpable pedal pulses. I'm going to get lower extremity arterial Dopplers to further evaluate her circulatory status.

## 2015-10-29 NOTE — Patient Instructions (Signed)
Medication Instructions:  Your physician recommends that you continue on your current medications as directed. Please refer to the Current Medication list given to you today.  Testing/Procedures: Your physician has requested that you have a lower extremity arterial doppler- During this test, ultrasound is used to evaluate arterial blood flow in the legs. Allow approximately one hour for this exam.    Follow-Up: Your physician recommends that you schedule a follow-up appointment as needed with Dr. Gwenlyn Found.  If you need a refill on your cardiac medications before your next appointment, please call your pharmacy.

## 2015-10-30 ENCOUNTER — Other Ambulatory Visit: Payer: Self-pay | Admitting: Cardiovascular Disease

## 2015-10-30 ENCOUNTER — Encounter: Payer: Self-pay | Admitting: *Deleted

## 2015-10-30 DIAGNOSIS — Z01818 Encounter for other preprocedural examination: Secondary | ICD-10-CM

## 2015-10-30 DIAGNOSIS — L97501 Non-pressure chronic ulcer of other part of unspecified foot limited to breakdown of skin: Secondary | ICD-10-CM

## 2015-10-30 NOTE — Telephone Encounter (Signed)
New message    Pt verbalized that she wants the vascular test done at [facility] because its closer to her home she said to leave vm

## 2015-10-30 NOTE — Telephone Encounter (Signed)
Will route msg to scheduling to see if this request can be accomodated. Pt sched for 11/16 for her test.

## 2015-11-03 ENCOUNTER — Telehealth: Payer: Self-pay

## 2015-11-03 ENCOUNTER — Other Ambulatory Visit: Payer: Self-pay | Admitting: Family Medicine

## 2015-11-03 NOTE — Telephone Encounter (Signed)
Called in Ambien 5mg  #15 R-2 per Dr. Wendi Snipes.

## 2015-11-03 NOTE — Telephone Encounter (Signed)
rx called into pharmacy

## 2015-11-04 ENCOUNTER — Encounter (INDEPENDENT_AMBULATORY_CARE_PROVIDER_SITE_OTHER): Payer: Self-pay | Admitting: Internal Medicine

## 2015-11-04 ENCOUNTER — Ambulatory Visit (INDEPENDENT_AMBULATORY_CARE_PROVIDER_SITE_OTHER): Payer: Medicare Other | Admitting: Internal Medicine

## 2015-11-04 VITALS — BP 130/80 | HR 57 | Temp 97.2°F | Resp 18 | Ht 63.0 in | Wt 135.4 lb

## 2015-11-04 DIAGNOSIS — K219 Gastro-esophageal reflux disease without esophagitis: Secondary | ICD-10-CM | POA: Diagnosis not present

## 2015-11-04 DIAGNOSIS — K921 Melena: Secondary | ICD-10-CM

## 2015-11-04 NOTE — Patient Instructions (Addendum)
Notify if you have frank rectal bleeding or tarry stool(many or not taking Pepto-Bismol).

## 2015-11-04 NOTE — Progress Notes (Signed)
Presenting complaint;  Follow-up for GI bleed and GERD.  Database and Subjective:  Patient is 82 year old Caucasian female who was hospitalized last month for melena and rectal bleeding and underwent EGD and colonoscopy. EGD revealed sliding hiatal hernia and erosive antral patient not return for follow-up visit. She states she feels fine. She has not had any more episodes of melena or rectal bleeding. She's had one or 2 episodes where she noted blood on tissue. She may have heartburn once or twice a week. She has good appetite she denies dysphagia or abdominal pain nausea or vomiting. gastritis. Biopsy was negative for H. pylori. Colonoscopy revealed diverticulosis and internal hemorrhoids. She had blood work on 10/10/2015 and her H&H was 12.1 and 36.6.   Current Medications: Outpatient Encounter Prescriptions as of 11/04/2015  Medication Sig  . atenolol (TENORMIN) 25 MG tablet Take 25 mg by mouth daily.   Marland Kitchen atorvastatin (LIPITOR) 20 MG tablet Take 1 tablet (20 mg total) by mouth at bedtime.  . flecainide (TAMBOCOR) 100 MG tablet Take 1 tablet (100 mg total) by mouth 2 (two) times daily.  . furosemide (LASIX) 40 MG tablet Take 20 mg by mouth daily.  Marland Kitchen gabapentin (NEURONTIN) 100 MG capsule Take 100 mg by mouth at bedtime.  Marland Kitchen HYDROcodone-acetaminophen (NORCO/VICODIN) 5-325 MG tablet Take 0.5-1 tablets by mouth every 6 (six) hours as needed for moderate pain.  Marland Kitchen levETIRAcetam (KEPPRA) 500 MG tablet Take 500 mg by mouth daily.   Marland Kitchen levothyroxine (SYNTHROID, LEVOTHROID) 50 MCG tablet Take 50 mcg by mouth daily before breakfast.   . lisinopril (PRINIVIL,ZESTRIL) 10 MG tablet Take 10 mg by mouth daily.  . pantoprazole (PROTONIX) 40 MG tablet Take 1 tablet (40 mg total) by mouth daily.  . potassium chloride SA (K-DUR,KLOR-CON) 20 MEQ tablet Take 20 mEq by mouth daily with breakfast.   . solifenacin (VESICARE) 10 MG tablet Take 1 tablet (10 mg total) by mouth daily.  . traMADol (ULTRAM) 50 MG tablet  Take 0.5-1 tablets (25-50 mg total) by mouth every 8 (eight) hours as needed.  . vitamin B-12 (CYANOCOBALAMIN) 500 MCG tablet Take 500 mcg by mouth every Monday, Wednesday, and Friday.   . Vitamin D, Ergocalciferol, (DRISDOL) 50000 units CAPS capsule Take 50,000 Units by mouth every Monday.  . warfarin (COUMADIN) 5 MG tablet Take 5 mg by mouth daily.   Marland Kitchen zolpidem (AMBIEN) 5 MG tablet TAKE 1/2 TO 1 TABLET AT BEDTIME AS NEEDED FOR INSOMNIA  . hydrocortisone-pramoxine (PROCTOFOAM HC) rectal foam Place 1 applicator rectally 2 (two) times daily. (Patient not taking: Reported on 11/04/2015)  . [DISCONTINUED] solifenacin (VESICARE) 10 MG tablet Take 1 tablet (10 mg total) by mouth daily. (Patient not taking: Reported on 11/04/2015)   No facility-administered encounter medications on file as of 11/04/2015.      Objective: Blood pressure 130/80, pulse (!) 57, temperature 97.2 F (36.2 C), temperature source Oral, resp. rate 18, height 5\' 3"  (1.6 m), weight 135 lb 6.4 oz (61.4 kg). Patient is alert and in no acute distress. Conjunctiva is pink. Sclera is nonicteric Oropharyngeal mucosa is normal. No neck masses or thyromegaly noted. Abdomen is soft and nontender without organomegaly or masses. No LE edema or clubbing noted.  Labs/studies Results:   H&H was 12.1 and 36.6 on 10/10/2015.  Assessment:  #1. GERD. Symptoms well controlled with therapy. #2. History of GI bleed. She had both melena and bright red blood per rectum. She could've bled from erosive gastritis in the setting of anticoagulation or she could  have bled from small bowel. Hematochezia would appear to be secondary to hemorrhoids well documented on colonoscopy. H&H is normal. No further workup indicated unless bleeding recurs.   Plan:  Patient advised to call office if she has melena or frank rectal bleeding. Office visit in one year.

## 2015-11-13 ENCOUNTER — Ambulatory Visit (INDEPENDENT_AMBULATORY_CARE_PROVIDER_SITE_OTHER): Payer: Medicare Other | Admitting: Pharmacist

## 2015-11-13 DIAGNOSIS — I480 Paroxysmal atrial fibrillation: Secondary | ICD-10-CM | POA: Diagnosis not present

## 2015-11-13 LAB — COAGUCHEK XS/INR WAIVED
INR: 4.5 — AB (ref 0.9–1.1)
PROTHROMBIN TIME: 53.9 s

## 2015-11-13 MED ORDER — ATORVASTATIN CALCIUM 20 MG PO TABS: 10.0000 mg | ORAL_TABLET | Freq: Every day | ORAL | 0 refills | Status: DC

## 2015-11-13 MED ORDER — WARFARIN SODIUM 5 MG PO TABS: ORAL_TABLET | ORAL | 1 refills | Status: DC

## 2015-11-18 ENCOUNTER — Encounter: Payer: Self-pay | Admitting: Pharmacist

## 2015-11-20 ENCOUNTER — Encounter (HOSPITAL_COMMUNITY): Payer: Medicare Other

## 2015-11-20 ENCOUNTER — Ambulatory Visit (HOSPITAL_COMMUNITY)
Admission: RE | Admit: 2015-11-20 | Discharge: 2015-11-20 | Disposition: A | Discharge status: Home / Self Care | Payer: Medicare Other | Source: Ambulatory Visit | Attending: Cardiovascular Disease | Admitting: Cardiovascular Disease

## 2015-11-20 DIAGNOSIS — I70201 Unspecified atherosclerosis of native arteries of extremities, right leg: Secondary | ICD-10-CM | POA: Insufficient documentation

## 2015-11-20 DIAGNOSIS — L97501 Non-pressure chronic ulcer of other part of unspecified foot limited to breakdown of skin: Secondary | ICD-10-CM | POA: Diagnosis not present

## 2015-11-20 DIAGNOSIS — Z01818 Encounter for other preprocedural examination: Secondary | ICD-10-CM | POA: Diagnosis not present

## 2015-11-20 DIAGNOSIS — L97909 Non-pressure chronic ulcer of unspecified part of unspecified lower leg with unspecified severity: Secondary | ICD-10-CM | POA: Diagnosis not present

## 2015-11-21 ENCOUNTER — Ambulatory Visit (INDEPENDENT_AMBULATORY_CARE_PROVIDER_SITE_OTHER): Payer: Medicare Other | Admitting: Pharmacist

## 2015-11-21 DIAGNOSIS — I480 Paroxysmal atrial fibrillation: Secondary | ICD-10-CM

## 2015-11-21 LAB — COAGUCHEK XS/INR WAIVED
INR: 2.3 — ABNORMAL HIGH (ref 0.9–1.1)
Prothrombin Time: 27.2 s

## 2015-12-04 ENCOUNTER — Other Ambulatory Visit: Payer: Self-pay | Admitting: Family Medicine

## 2015-12-05 ENCOUNTER — Other Ambulatory Visit: Payer: Self-pay | Admitting: Family Medicine

## 2015-12-05 ENCOUNTER — Ambulatory Visit (INDEPENDENT_AMBULATORY_CARE_PROVIDER_SITE_OTHER): Payer: Medicare Other | Admitting: Cardiovascular Disease

## 2015-12-05 ENCOUNTER — Encounter: Payer: Self-pay | Admitting: Cardiovascular Disease

## 2015-12-05 DIAGNOSIS — L97501 Non-pressure chronic ulcer of other part of unspecified foot limited to breakdown of skin: Secondary | ICD-10-CM

## 2015-12-05 NOTE — Patient Instructions (Signed)

## 2015-12-05 NOTE — Assessment & Plan Note (Signed)
[name] returns today for follow-up of chronic discoloration of her left second toe. She is seeing Dr. Doran Durand who is exploring the possibility of amputation. Her Dopplers showed no elevation of velocities and triphasic waveforms from her iliacs down to her popliteal which point they become biphasic. She has palpable pedal pulses on the left on my exam. If she required amputation I believe she has enough circulation to promote healing. I will see her back in 6 months.

## 2015-12-05 NOTE — Progress Notes (Signed)
Mrs. 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 She is seeing Dr. Doran Durand who is exploring the possibility of amputation. Her Dopplers showed no elevation of velocities and triphasic waveforms from her iliacs down to her popliteal which point they become biphasic. She has palpable pedal pulses on the left on my exam. If she required amputation I believe she has enough circulation to promote healing. I will see her back in 6 months.

## 2015-12-08 ENCOUNTER — Other Ambulatory Visit: Payer: Self-pay | Admitting: Family Medicine

## 2015-12-08 ENCOUNTER — Ambulatory Visit (INDEPENDENT_AMBULATORY_CARE_PROVIDER_SITE_OTHER): Payer: Medicare Other | Admitting: Pharmacist

## 2015-12-08 DIAGNOSIS — I480 Paroxysmal atrial fibrillation: Secondary | ICD-10-CM | POA: Diagnosis not present

## 2015-12-08 LAB — COAGUCHEK XS/INR WAIVED
INR: 3 — ABNORMAL HIGH (ref 0.9–1.1)
Prothrombin Time: 35.7 s

## 2015-12-08 NOTE — Patient Instructions (Signed)
Anticoagulation Dose Instructions as of 12/08/2015      [name] Tue Wed Thu Fri Sat   New Dose 5 mg 2.5 mg 5 mg 5 mg 5 mg 2.5 mg 5 mg    Description   Restart previous warfarin 5mg  dose - take 1/2 tablet every Monday and Friday.  Take 1 tablet all the other days.  INR was 3.0 today (goal is 2.0 to 3.0)

## 2015-12-10 ENCOUNTER — Ambulatory Visit (INDEPENDENT_AMBULATORY_CARE_PROVIDER_SITE_OTHER): Payer: Medicare Other | Admitting: Family Medicine

## 2015-12-10 ENCOUNTER — Encounter: Payer: Self-pay | Admitting: Family Medicine

## 2015-12-10 ENCOUNTER — Telehealth: Payer: Self-pay | Admitting: Pharmacist

## 2015-12-10 VITALS — BP 132/62 | HR 57 | Temp 96.7°F | Ht 62.0 in | Wt 132.6 lb

## 2015-12-10 DIAGNOSIS — M79605 Pain in left leg: Secondary | ICD-10-CM | POA: Diagnosis not present

## 2015-12-10 MED ORDER — PREDNISONE 10 MG PO TABS: ORAL_TABLET | ORAL | 0 refills | Status: DC

## 2015-12-10 NOTE — Progress Notes (Signed)
   HPI  Patient presents today for bilateral leg pain.  Patient states over the last 1-2 weeks she's had difficulty with leg pain. She describes left greater than right lateral hip pain that's draining down her anterior thigh and then onto the lower leg. She denies any back pain. She denies any injury. She denies any difficulty walking compared to usual or leg weakness.  There are no alleviating factors. Getting up from a sitting position causes more pain.  She had a recent INR checked, it was 3.0.   PMH: Smoking status noted ROS: Per HPI  Objective: BP 132/62   Pulse (!) 57   Temp (!) 96.7 F (35.9 C) (Oral)   Ht 5\' 2"  (1.575 m)   Wt 132 lb 9.6 oz (60.1 kg)   BMI 24.25 kg/m  Gen: NAD, alert, cooperative with exam HEENT: NCAT CV: RRR Resp: CTABL, no wheezes, non-labored Ext: No edema, warm Neuro: Alert and oriented, strength 4/5 and sensation intact in bilateral lower extremities  Assessment and plan:  # Left lower extremity pain With radicular is to be additional suspect that the etiology of her pain is actually originating from the back. I'm treating her with a 12 day course of prednisone. I discussed gabapentin as a possible step two if this does not improve her pain.  Discussed that this will likely affect her INR, will discuss with clinical pharmacist for close follow-up   Meds ordered this encounter  Medications  . predniSONE (DELTASONE) 10 MG tablet    Sig: Take 4 pills a day for 3 days, then 3 pills a day for 3 days, then 2 pills a day for 3 days, then 1 pill a day for 3 days, then stop    Dispense:  30 tablet    Refill:  0    Laroy Apple, MD [facility] 12/10/2015, 10:20 AM

## 2015-12-10 NOTE — Telephone Encounter (Signed)
Patient is starting prednisone taper for next 6 days.  Recommend she decrease warfarin 5mg  to 1/2 tablet daily.  RTC 12/15/15 to recheck INR.  Patient aware

## 2015-12-10 NOTE — Patient Instructions (Signed)
Great to see you!  Try the prednisone for the pain, please call or come back right away if you have any concerns.

## 2015-12-15 ENCOUNTER — Ambulatory Visit (INDEPENDENT_AMBULATORY_CARE_PROVIDER_SITE_OTHER): Payer: Medicare Other | Admitting: Pharmacist

## 2015-12-15 DIAGNOSIS — I480 Paroxysmal atrial fibrillation: Secondary | ICD-10-CM

## 2015-12-15 LAB — COAGUCHEK XS/INR WAIVED
INR: 2.1 — ABNORMAL HIGH (ref 0.9–1.1)
Prothrombin Time: 24.9 s

## 2015-12-15 NOTE — Patient Instructions (Addendum)
Anticoagulation  And Prednisone Dose Instructions as of 12/15/2015      [name] Tue Wed Thu Fri Sat   12/14/2015 thru 12/20/2015  Warfarin 2.5 mg 1/2 tablet  Prednisone Take 3 tablets Warfarin 5 mg 1 tablet  Prednisone Take 2 tablets Warfarin 2.5 mg 1/2 tablet  Prednisone Take 2 tablets Warfarin 5 mg 1 tablet  Prednisone Take 2 tablets Warfarin 2.5 mg 1/2 tablet  Prednisone Take 1 tablet Warfarin 5 mg 1 tablet  Prednisone Take 1 tablet   12/21/2015 Thru 12/27/2015 Warfarin 5 mg 1 tablet  Prednisone Take 1 tablet Warfarin 2.5 mg 1/2 tablet   Warfarin 5 mg 1 tablet   Warfarin 5 mg 1 tablet   Warfarin 5 mg 1 tablet   Warfarin 2.5 mg 1/2 tablet   Warfarin 5 mg 1 tablet      Description    INR was 2.1 today       Using a Cane How to Measure for a Cane  The correct cane length is the key to safe use and better mobility. Many models of canes are adjustable, but it is still helpful to know what the proper length should be. 1. Obtain measurements while wearing regular walking shoes. 2. Standing upright, allow arms to relax (with normal bend at the elbow) at your sides. 3. Have a second person measure the distance from your wrist joint down to the floor. This number is the right length of cane for you. An estimate of the proper cane length can be made by dividing an individual's height by two. For most persons, the right sized cane is within one inch of half their height. This guideline can be applied if the user is not available for an actual measurement.  Proper Use of a Cane To walk safely with a cane on level surfaces: 1. Hold the cane in the hand on your "good" side so that it provides support to the opposite lower limb 2. Take a step with the "bad" leg and bring the cane forward at the same time. Move the cane and affected leg forward together. 3. Lean your weight through the arm holding the cane as needed 4. Always have the bad leg  assume the first full weight-bearing step on level surfaces 5. The cane should be moved the distance of one average step forward with each move. You should not feel that you are stretching to catch up to the cane or stepping ahead of it. If you are using the cane for general mobility rather than an injury, hold the cane using your dominant hand and bear weight on this side of your body. If you are working with a physical therapist due to an injury, he or she may have a specific cane-walking plan different from this one.  Managing Steps with a Kasandra Knudsen To properly ascend stairs, it is "up with the good." While holding onto the rail with one hand, advance the stronger leg first placing it on the step above where you are standing. After this good leg is appropriately placed on the step, advance the weaker leg up to the same step that the stronger leg is on. If there is no rail to hold on to, the cane is placed on the upper step at the same time or after placement of the weaker leg. To properly descend stairs, it is "down with the bad." While holding onto the rail with one hand, advance the weaker leg first placing it on the step below where  you are standing. After this affected leg is appropriately placed on the step, advance the stronger leg down to the same step that the weaker leg is on. If there is no rail to hold on to, the cane is placed on the lower step at the same time or after placement of the stronger leg.

## 2015-12-23 DIAGNOSIS — H04123 Dry eye syndrome of bilateral lacrimal glands: Secondary | ICD-10-CM | POA: Diagnosis not present

## 2015-12-23 DIAGNOSIS — H40033 Anatomical narrow angle, bilateral: Secondary | ICD-10-CM | POA: Diagnosis not present

## 2015-12-26 ENCOUNTER — Ambulatory Visit: Payer: Medicare Other | Admitting: Family Medicine

## 2015-12-30 ENCOUNTER — Ambulatory Visit: Payer: Medicare Other | Admitting: Family Medicine

## 2016-01-07 ENCOUNTER — Encounter: Payer: Self-pay | Admitting: Family Medicine

## 2016-01-07 ENCOUNTER — Ambulatory Visit (INDEPENDENT_AMBULATORY_CARE_PROVIDER_SITE_OTHER): Payer: Medicare Other | Admitting: Family Medicine

## 2016-01-07 VITALS — BP 139/64 | HR 59 | Temp 96.5°F | Ht 62.0 in | Wt 137.2 lb

## 2016-01-07 DIAGNOSIS — I480 Paroxysmal atrial fibrillation: Secondary | ICD-10-CM | POA: Diagnosis not present

## 2016-01-07 DIAGNOSIS — I10 Essential (primary) hypertension: Secondary | ICD-10-CM

## 2016-01-07 DIAGNOSIS — M7062 Trochanteric bursitis, left hip: Secondary | ICD-10-CM

## 2016-01-07 DIAGNOSIS — L97501 Non-pressure chronic ulcer of other part of unspecified foot limited to breakdown of skin: Secondary | ICD-10-CM | POA: Diagnosis not present

## 2016-01-07 LAB — COAGUCHEK XS/INR WAIVED
INR: 1.6 — ABNORMAL HIGH (ref 0.9–1.1)
Prothrombin Time: 19.7 s

## 2016-01-07 MED ORDER — ZOLPIDEM TARTRATE 5 MG PO TABS
ORAL_TABLET | ORAL | 2 refills | Status: DC
Start: 2016-01-07 — End: 2016-01-19

## 2016-01-07 MED ORDER — GABAPENTIN 300 MG PO CAPS: 300.0000 mg | ORAL_CAPSULE | Freq: Three times a day (TID) | ORAL | 3 refills | Status: DC

## 2016-01-07 NOTE — Progress Notes (Signed)
   HPI  Patient presents today here for follow-up of hypertension, anticoagulation, left leg pain, and chronic toe ulcer.  Chronic toe ulcer Patient has seen podiatry and was referred orthopedic surgery to miss the appointment due to a gallbladder issue and gallbladder surgery. Needs a referral. No signs of infection or concerns for infection at this time.  Hypertension Good medication compliance, not really checking at home.  Sleep Ambien is helping, needs refill.  Left leg pain. Patient has left lateral hip pain and left lower back pain at times. Left hip is reading when she walks. Patient had great response to prednisone, however the pain is returned.  PMH: Smoking status noted ROS: Per HPI  Objective: BP 139/64   Pulse (!) 59   Temp (!) 96.5 F (35.8 C) (Oral)   Ht 5\' 2"  (1.575 m)   Wt 137 lb 3.2 oz (62.2 kg)   BMI 25.09 kg/m  Gen: NAD, alert, cooperative with exam HEENT: NCAT CV: RRR, good S1/S2, no murmur Resp: CTABL, no wheezes, non-labored Ext: No edema, left second toe with approximately 5 mm raised lesion on the medial surface, mild surrounding erythema with no induration, drainage, or warmth. Neuro: Alert and oriented, No gross deficits    Assessment and plan:  # Hypertension Well-controlled on current medications, no changes  # Excisional atrial fibrillation Anticoagulated chronically, INR is 1.6 today Increase weekly Coumadin dose from 35 mg to 37.5 mg, increase to 1-1/2 pill on Wednesdays, 1 pill other days. Patient understands easily  # Greater trochanteric bursitis Likely left hip bursitis Recommended conservative treatment with recent steroid use. If this is not improved I would consider injection with Depo-Medrol at the source to hopefully achieve better   # Chronic toe ulcer Referring orthopedic surgery, patient has had this ulcer for more than a year. She was previously scheduled to see orthopedic surgery and had to have a gallbladder surgery  missing the appointment. No signs of active infection    Orders Placed This Encounter  Procedures  . Ambulatory referral to Orthopedic Surgery    Referral Priority:   Routine    Referral Type:   Surgical    Referral Reason:   Specialty Services Required    Requested Specialty:   Orthopedic Surgery    Number of Visits Requested:   1    Meds ordered this encounter  Medications  . zolpidem (AMBIEN) 5 MG tablet    Sig: TAKE 1/2 TO 1 TABLET AT BEDTIME AS NEEDED FOR INSOMNIA    Dispense:  15 tablet    Refill:  2  . gabapentin (NEURONTIN) 300 MG capsule    Sig: Take 1 capsule (300 mg total) by mouth 3 (three) times daily.    Dispense:  90 capsule    Refill:  [place], MD [place] 01/07/2016, 4:17 PM

## 2016-01-07 NOTE — Patient Instructions (Signed)
Great to see you!  I have increased the gabapentin to 300 mg three times daily, start at night with the higher dose  Please keep a log of your blood pressures.   Come back in 3 months

## 2016-01-13 ENCOUNTER — Other Ambulatory Visit: Payer: Self-pay | Admitting: Family Medicine

## 2016-01-15 ENCOUNTER — Other Ambulatory Visit: Payer: Self-pay | Admitting: *Deleted

## 2016-01-15 MED ORDER — WARFARIN SODIUM 5 MG PO TABS: ORAL_TABLET | ORAL | 1 refills | Status: DC

## 2016-01-19 ENCOUNTER — Encounter (INDEPENDENT_AMBULATORY_CARE_PROVIDER_SITE_OTHER): Payer: Self-pay

## 2016-01-19 ENCOUNTER — Ambulatory Visit (INDEPENDENT_AMBULATORY_CARE_PROVIDER_SITE_OTHER): Payer: Medicare Other | Admitting: Family Medicine

## 2016-01-19 ENCOUNTER — Encounter: Payer: Self-pay | Admitting: Family Medicine

## 2016-01-19 VITALS — BP 138/50 | HR 59 | Temp 96.6°F | Ht 62.0 in | Wt 135.0 lb

## 2016-01-19 DIAGNOSIS — G4700 Insomnia, unspecified: Secondary | ICD-10-CM | POA: Diagnosis not present

## 2016-01-19 DIAGNOSIS — I10 Essential (primary) hypertension: Secondary | ICD-10-CM

## 2016-01-19 DIAGNOSIS — G894 Chronic pain syndrome: Secondary | ICD-10-CM

## 2016-01-19 MED ORDER — ZOLPIDEM TARTRATE 5 MG PO TABS: ORAL_TABLET | ORAL | 2 refills | Status: DC

## 2016-01-19 MED ORDER — TRAMADOL HCL 50 MG PO TABS: 25.0000 mg | ORAL_TABLET | Freq: Three times a day (TID) | ORAL | 0 refills | Status: DC | PRN

## 2016-01-19 NOTE — Patient Instructions (Signed)
Great to see you!  Try gabapentin 300 mg at night, it may help with your leep. You can also consider trying melatonin ( 5 mg at night).

## 2016-01-19 NOTE — Progress Notes (Signed)
   HPI  Patient presents today medication refill.  Insomnia Patient with difficulty sleeping for quite some time, she is requiring at least one half of Ambien every night. She denies any side effects from the medication. She's been hesitant to try 300 mg of gabapentin which was prescribed for pain that may also help with sleep as I described to her previously.  Patient requests refill of flecainide- has 3-5 days supply   Chronic pain syndrome Patient with intermittent left leg pain especially after first waking up in the morning. Tramadol helps well, she's used 30 pills in the last 3 months. Extra medication and medications are effective.  Hypertension Good medication compliance   PMH: Smoking status noted ROS: Per HPI  Objective: BP (!) 138/50   Pulse (!) 59   Temp (!) 96.6 F (35.9 C) (Oral)   Ht 5\' 2"  (1.575 m)   Wt 135 lb (61.2 kg)   BMI 24.69 kg/m  Gen: NAD, alert, cooperative with exam HEENT: NCAT CV: RRR, good S1/S2 Resp: CTABL, no wheezes, non-labored Ext: No edema, warm Neuro: Alert and oriented, No gross deficits  Assessment and plan:  # Insomnia Refilled Ambien, given 30 pills with 2 refills Recommended trying gabapentin 300 mg at night which may be effective for pain as well as sleep. Also discussed the possibility of using melatonin 5 mg as needed to see if she can reduce the dose of Ambien at 2.5 mg   # Hypertension Initially elevated, improved after sitting for several minutes. Continue current medications including atenolol lisinopril, Lasix  # Chronic pain syndrome Intermittent pain and several areas Small amount of tramadol refilled Discussed increasing gabapentin dose.   Meds ordered this encounter  Medications  . zolpidem (AMBIEN) 5 MG tablet    Sig: TAKE 1/2 TO 1 TABLET AT BEDTIME AS NEEDED FOR INSOMNIA    Dispense:  30 tablet    Refill:  2  . traMADol (ULTRAM) 50 MG tablet    Sig: Take 0.5-1 tablets (25-50 mg total) by mouth every 8  (eight) hours as needed.    Dispense:  30 tablet    Refill:  0    Laroy Apple, MD [facility] 01/19/2016, 3:03 PM

## 2016-01-26 ENCOUNTER — Ambulatory Visit (INDEPENDENT_AMBULATORY_CARE_PROVIDER_SITE_OTHER): Payer: Medicare Other | Admitting: Pharmacist

## 2016-01-26 DIAGNOSIS — I480 Paroxysmal atrial fibrillation: Secondary | ICD-10-CM

## 2016-01-26 LAB — COAGUCHEK XS/INR WAIVED
INR: 2.4 — AB (ref 0.9–1.1)
Prothrombin Time: 29.3 s

## 2016-01-28 ENCOUNTER — Other Ambulatory Visit: Payer: Self-pay | Admitting: Family Medicine

## 2016-02-02 DIAGNOSIS — E11621 Type 2 diabetes mellitus with foot ulcer: Secondary | ICD-10-CM | POA: Diagnosis not present

## 2016-02-02 DIAGNOSIS — M2042 Other hammer toe(s) (acquired), left foot: Secondary | ICD-10-CM | POA: Diagnosis not present

## 2016-02-02 DIAGNOSIS — L97529 Non-pressure chronic ulcer of other part of left foot with unspecified severity: Secondary | ICD-10-CM | POA: Diagnosis not present

## 2016-02-03 ENCOUNTER — Telehealth: Payer: Self-pay

## 2016-02-03 NOTE — Telephone Encounter (Signed)
Clearance Signed and placed to be faxed.   Will forward to Clinical pharmacist for her opinion to hold coumadin, I assume 5 days prior to procedure.   Laroy Apple, MD [facility] 02/03/2016, 5:46 PM

## 2016-02-04 NOTE — Telephone Encounter (Signed)
I agree with holding warfarin starting 5 days prior to surgery.  Patient may restart warfarin 5mg  24 hours after surgery as long as hemodynamically stable.  Recommend 7.5mg  (1 and 1/2 tablets) for 2 doses then resume usual warfarin dose of 2.5mg  on Mondays and Fridays and 5mg  all other days. I can review plan with patient at next visit 02/16/16 or sooner depending on when surgery is scheduled.

## 2016-02-04 NOTE — Telephone Encounter (Signed)
Pt notified of recommendation Verbalizes understanding 

## 2016-02-09 ENCOUNTER — Telehealth: Payer: Self-pay

## 2016-02-09 NOTE — Telephone Encounter (Signed)
I am ok with a referral if needed, does not souind like it is needed.   Laroy Apple, MD [facility] 02/09/2016, 12:57 PM

## 2016-02-16 ENCOUNTER — Ambulatory Visit (INDEPENDENT_AMBULATORY_CARE_PROVIDER_SITE_OTHER): Payer: Medicare Other | Admitting: Pharmacist

## 2016-02-16 DIAGNOSIS — I480 Paroxysmal atrial fibrillation: Secondary | ICD-10-CM

## 2016-02-16 LAB — COAGUCHEK XS/INR WAIVED
INR: 2.7 — AB (ref 0.9–1.1)
Prothrombin Time: 31.9 s

## 2016-02-19 ENCOUNTER — Ambulatory Visit: Payer: Self-pay | Admitting: Family Medicine

## 2016-02-21 ENCOUNTER — Other Ambulatory Visit: Payer: Self-pay | Admitting: Family Medicine

## 2016-02-24 DIAGNOSIS — L84 Corns and callosities: Secondary | ICD-10-CM | POA: Diagnosis not present

## 2016-02-24 DIAGNOSIS — M2042 Other hammer toe(s) (acquired), left foot: Secondary | ICD-10-CM | POA: Diagnosis not present

## 2016-02-24 DIAGNOSIS — L97524 Non-pressure chronic ulcer of other part of left foot with necrosis of bone: Secondary | ICD-10-CM | POA: Diagnosis not present

## 2016-02-24 DIAGNOSIS — M2041 Other hammer toe(s) (acquired), right foot: Secondary | ICD-10-CM | POA: Diagnosis not present

## 2016-02-24 DIAGNOSIS — L97511 Non-pressure chronic ulcer of other part of right foot limited to breakdown of skin: Secondary | ICD-10-CM | POA: Diagnosis not present

## 2016-03-03 ENCOUNTER — Ambulatory Visit (INDEPENDENT_AMBULATORY_CARE_PROVIDER_SITE_OTHER): Payer: Medicare Other | Admitting: Pharmacist

## 2016-03-03 DIAGNOSIS — I480 Paroxysmal atrial fibrillation: Secondary | ICD-10-CM

## 2016-03-03 LAB — COAGUCHEK XS/INR WAIVED
INR: 2.6 — ABNORMAL HIGH (ref 0.9–1.1)
PROTHROMBIN TIME: 31.1 s

## 2016-03-08 ENCOUNTER — Encounter: Payer: Self-pay | Admitting: Family Medicine

## 2016-03-08 ENCOUNTER — Ambulatory Visit (INDEPENDENT_AMBULATORY_CARE_PROVIDER_SITE_OTHER): Payer: Medicare Other | Admitting: Family Medicine

## 2016-03-08 VITALS — BP 129/67 | HR 58 | Temp 98.0°F | Ht 62.0 in | Wt 130.1 lb

## 2016-03-08 DIAGNOSIS — R3911 Hesitancy of micturition: Secondary | ICD-10-CM

## 2016-03-08 DIAGNOSIS — J0100 Acute maxillary sinusitis, unspecified: Secondary | ICD-10-CM | POA: Diagnosis not present

## 2016-03-08 MED ORDER — CEFDINIR 300 MG PO CAPS: 300.0000 mg | ORAL_CAPSULE | Freq: Two times a day (BID) | ORAL | 0 refills | Status: DC

## 2016-03-09 NOTE — Progress Notes (Signed)
BP 129/67   Pulse (!) 58   Temp 98 F (36.7 C) (Oral)   Ht 5\' 2"  (1.575 m)   Wt 130 lb 2 oz (59 kg)   BMI 23.80 kg/m    Subjective:    Patient ID: [name], female    DOB: 07-15-1932, 83 y.o.   MRN: [id number]  HPI: [name] is a 83 y.o. female presenting on 03/08/2016 for URI (cough, runny nose, sinus drainage; symptoms began one week ago)   HPI Cough and congestion and sinus drainage and chest congestion Patient comes in with 1 week's worth of cough and congestion and sinus congestion and drainage and chest congestion that she feels like is worsening. She denies any fevers or chills or shortness of breath and wheezing over this past week but just has been having a lot more congestion. She has been using Tylenol over-the-counter sinus medication. She has been having coughing spells and the coughing is productive of yellow-green sputum. She feels like the cough has recurred keeping her up at night and also has been leaving the coughing spells during the day that are significantly bothering her.  Urinary hesitancy Patient used to have some urinary incontinence and has been on oxybutynin for quite a time but she feels like just recently over the past couple weeks she can't urinate and feels like she stopped up and has to strain and push a lot to be able to urinate.  Relevant past medical, surgical, family and social history reviewed and updated as indicated. Interim medical history since our last visit reviewed. Allergies and medications reviewed and updated.  Review of Systems  Constitutional: Negative for chills and fever.  HENT: Positive for congestion, postnasal drip, rhinorrhea, sinus pressure, sneezing and sore throat. Negative for ear discharge and ear pain.   Eyes: Negative for pain, redness and visual disturbance.  Respiratory: Negative for chest tightness and shortness of breath.   Cardiovascular: Negative for chest pain and leg swelling.  Genitourinary: Positive  for difficulty urinating. Negative for dysuria, frequency, hematuria and urgency.  Musculoskeletal: Negative for back pain and gait problem.  Skin: Negative for rash.  Neurological: Negative for light-headedness and headaches.  Psychiatric/Behavioral: Negative for agitation and behavioral problems.  All other systems reviewed and are negative.   Per HPI unless specifically indicated above     Objective:    BP 129/67   Pulse (!) 58   Temp 98 F (36.7 C) (Oral)   Ht 5\' 2"  (1.575 m)   Wt 130 lb 2 oz (59 kg)   BMI 23.80 kg/m   Wt Readings from Last 3 Encounters:  03/08/16 130 lb 2 oz (59 kg)  01/19/16 135 lb (61.2 kg)  01/07/16 137 lb 3.2 oz (62.2 kg)    Physical Exam  Constitutional: She is oriented to person, place, and time. She appears well-developed and well-nourished. No distress.  HENT:  Right Ear: Tympanic membrane, external ear and ear canal normal.  Left Ear: Tympanic membrane, external ear and ear canal normal.  Nose: Mucosal edema and rhinorrhea present. No epistaxis. Right sinus exhibits no maxillary sinus tenderness and no frontal sinus tenderness. Left sinus exhibits no maxillary sinus tenderness and no frontal sinus tenderness.  Mouth/Throat: Uvula is midline and mucous membranes are normal. Posterior oropharyngeal edema and posterior oropharyngeal erythema present. No oropharyngeal exudate or tonsillar abscesses.  Eyes: Conjunctivae and EOM are normal.  Neck: Neck supple.  Cardiovascular: Normal rate, regular rhythm, normal heart sounds and intact distal pulses.  No murmur heard. Pulmonary/Chest: Effort normal and breath sounds normal. No respiratory distress. She has no wheezes. She has no rales.  Abdominal: Soft. Bowel sounds are normal. There is no tenderness. There is no rebound and no guarding.  Musculoskeletal: Normal range of motion. She exhibits no edema or tenderness.  Lymphadenopathy:    She has no cervical adenopathy.  Neurological: She is alert and  oriented to person, place, and time. Coordination normal.  Skin: Skin is warm and dry. No rash noted. She is not diaphoretic.  Psychiatric: She has a normal mood and affect. Her behavior is normal.  Nursing note and vitals reviewed.   Results for orders placed or performed in visit on 03/03/16  CoaguChek XS/INR Waived  Result Value Ref Range   INR 2.6 (H) 0.9 - 1.1   Prothrombin Time 31.1 sec      Assessment & Plan:   Problem List Items Addressed This Visit    None    Visit Diagnoses    Acute non-recurrent maxillary sinusitis    -  Primary   Relevant Medications   cefdinir (OMNICEF) 300 MG capsule   Urinary hesitancy       Told her to stop the oxybutynin and see if she can start making urine and if not come back next week or sooner and see [place].       Follow up plan: Return in about 1 week (around 03/15/2016), or if symptoms worsen or fail to improve, for Coumadin recheck and recheck on bladder with PCP.  Counseling provided for all of the vaccine components No orders of the defined types were placed in this encounter.   Caryl Pina, MD [facility] 03/08/2016, 11:59 AM

## 2016-03-11 ENCOUNTER — Other Ambulatory Visit: Payer: Self-pay | Admitting: *Deleted

## 2016-03-11 MED ORDER — LEVOTHYROXINE SODIUM 50 MCG PO TABS: 50.0000 ug | ORAL_TABLET | Freq: Every day | ORAL | 0 refills | Status: DC

## 2016-03-15 ENCOUNTER — Other Ambulatory Visit: Payer: Self-pay

## 2016-03-15 ENCOUNTER — Other Ambulatory Visit: Payer: Self-pay | Admitting: Family Medicine

## 2016-03-16 ENCOUNTER — Ambulatory Visit (HOSPITAL_COMMUNITY)
Admission: RE | Admit: 2016-03-16 | Discharge: 2016-03-16 | Disposition: A | Discharge status: Home / Self Care | Payer: Medicare Other | Source: Ambulatory Visit | Attending: Family Medicine | Admitting: Family Medicine

## 2016-03-16 ENCOUNTER — Encounter: Payer: Self-pay | Admitting: Family Medicine

## 2016-03-16 ENCOUNTER — Ambulatory Visit (INDEPENDENT_AMBULATORY_CARE_PROVIDER_SITE_OTHER): Payer: Medicare Other | Admitting: Family Medicine

## 2016-03-16 VITALS — BP 132/74 | HR 90 | Temp 97.0°F | Ht 62.0 in | Wt 129.0 lb

## 2016-03-16 DIAGNOSIS — S0990XA Unspecified injury of head, initial encounter: Secondary | ICD-10-CM

## 2016-03-16 DIAGNOSIS — R2681 Unsteadiness on feet: Secondary | ICD-10-CM | POA: Insufficient documentation

## 2016-03-16 DIAGNOSIS — I480 Paroxysmal atrial fibrillation: Secondary | ICD-10-CM

## 2016-03-16 DIAGNOSIS — X58XXXA Exposure to other specified factors, initial encounter: Secondary | ICD-10-CM | POA: Diagnosis not present

## 2016-03-16 LAB — COAGUCHEK XS/INR WAIVED
INR: 2.7 — ABNORMAL HIGH (ref 0.9–1.1)
Prothrombin Time: 32.4 s

## 2016-03-16 NOTE — Patient Instructions (Signed)
We are arranging a CT scan of the head.

## 2016-03-16 NOTE — Progress Notes (Signed)
   HPI  Patient presents today here for follow-up, , also with fatigue.  Patient states that she has fatigue and "feels terrible for about for 5 days, she states that she fell last Thursday "bouncing her head". Her husband states that her personality is changed and she is more forgetful than usual.  She also states that she is unstable on her feet recently and has to use her walker all the time.  She has not changed her medications.  Patient states that her bladder symptoms have improved  She denies any fever, chills, sweats, headache, double vision, weakness or numbness on one side of the body, or difficulty thinking or speaking.  PMH: Smoking status noted ROS: Per HPI  Objective: BP 132/74   Pulse 90   Temp 97 F (36.1 C) (Oral)   Ht 5\' 2"  (1.575 m)   Wt 129 lb (58.5 kg)   BMI 23.59 kg/m  Gen: NAD, alert, cooperative with exam HEENT: NCAT, EOMI, PERRL CV: RRR, good S1/S2, no murmur Resp: CTABL, no wheezes, non-labored Ext: No edema, warm Neuro: Alert and oriented, strength 5/5 and sensation intact in all 4 extremities, cranial nerves II through XII intact, using a walker for walking   Assessment and plan:  # Traumatic injury of head, unstable gait Patient with Coumadin use and a therapeutic INR today, also now with unstable gait, personality change or hitting her head about 5 days ago. She denies headache, there is no obvious findings on her neuro exam. Stat CT head   # PAF Rate controlled, therapeutic INR No changes     Orders Placed This Encounter  Procedures  . CT Head Wo Contrast    Standing Status:   Future    Standing Expiration Date:   06/16/2017    Order Specific Question:   Reason for Exam (SYMPTOM  OR DIAGNOSIS REQUIRED)    Answer:   r/o [place], head traumalast week with instability, change in personality now    Order Specific Question:   Preferred imaging location?    Answer:   [facility]    Order Specific Question:   Radiology  Contrast Protocol will attach to exam    Answer:   [email or web address]     Laroy Apple, MD [facility] 03/16/2016, 2:56 PM

## 2016-03-17 ENCOUNTER — Telehealth: Payer: Self-pay | Admitting: Family Medicine

## 2016-03-18 NOTE — Telephone Encounter (Signed)
Reviewed head CT with pt. Pt voiced understanding.

## 2016-03-26 ENCOUNTER — Other Ambulatory Visit: Payer: Self-pay | Admitting: Family Medicine

## 2016-03-29 ENCOUNTER — Ambulatory Visit: Payer: Medicare Other | Admitting: Physical Therapy

## 2016-03-31 ENCOUNTER — Other Ambulatory Visit: Payer: Self-pay | Admitting: *Deleted

## 2016-03-31 ENCOUNTER — Ambulatory Visit (INDEPENDENT_AMBULATORY_CARE_PROVIDER_SITE_OTHER): Payer: Medicare Other | Admitting: Pharmacist

## 2016-03-31 DIAGNOSIS — I480 Paroxysmal atrial fibrillation: Secondary | ICD-10-CM | POA: Diagnosis not present

## 2016-03-31 DIAGNOSIS — Z961 Presence of intraocular lens: Secondary | ICD-10-CM | POA: Diagnosis not present

## 2016-03-31 DIAGNOSIS — H353131 Nonexudative age-related macular degeneration, bilateral, early dry stage: Secondary | ICD-10-CM | POA: Diagnosis not present

## 2016-03-31 LAB — COAGUCHEK XS/INR WAIVED
INR: 4.1 — ABNORMAL HIGH (ref 0.9–1.1)
Prothrombin Time: 49.4 s

## 2016-03-31 NOTE — Patient Instructions (Addendum)
Anticoagulation Dose Instructions as of 03/31/2016      [name] Tue Wed Thu Fri Sat        3/29  Hold 3/30  Hold 3/31  5 mg    4/1  5 mg 4/2  2.5 mg 4/3  5 mg 4/4  5 mg 4/5  5 mg 4/6  2.5 mg 4/7  5 mg    4/8  5mg  4/9  2.5mg          Description   No warfarin tomorrow - Thursday, March 29th or Friday, March 30th.  Then decrease warfarin 5mg  dose - take 1/2 tablet on Mondays and Fridays. Take 1 tablet all other days

## 2016-04-07 ENCOUNTER — Ambulatory Visit: Payer: Medicare Other | Attending: Orthopedic Surgery | Admitting: Physical Therapy

## 2016-04-07 ENCOUNTER — Encounter: Payer: Self-pay | Admitting: Physical Therapy

## 2016-04-07 DIAGNOSIS — R2689 Other abnormalities of gait and mobility: Secondary | ICD-10-CM | POA: Insufficient documentation

## 2016-04-07 DIAGNOSIS — M6281 Muscle weakness (generalized): Secondary | ICD-10-CM | POA: Diagnosis not present

## 2016-04-07 NOTE — Therapy (Addendum)
[facility] [place], [place], [address] Phone: [phone]   Fax:  [phone]  Physical Therapy Evaluation  Patient Details  Name: [name] MRN: [id number] Date of Birth: 09-30-1932 Referring Provider: Wylene Simmer  Encounter Date: 04/07/2016      PT End of Session - 04/07/16 1057    Visit Number 1   Number of Visits 8   Date for PT Re-Evaluation 06/02/16   PT Start Time 3149   PT Stop Time 1108   PT Time Calculation (min) 33 min   Activity Tolerance Patient tolerated treatment well   Behavior During Therapy [facility] for tasks assessed/performed      Past Medical History:  Diagnosis Date  . Anemia   . Atrial fibrillation (HCC)    Paroxysmal, Flecainide therapy  . Calf pain    September, 2012, at rest  . Cardiomegaly    EF normal, echo, 2003  //   EF 60-65%, echo, June 21, 7024, diastolic dysfunction,  . Carotid bruit    Doppler, December, 2009, no abnormality  . Cataract   . Diverticulosis   . GERD (gastroesophageal reflux disease)   . History of shingles   . HLD (hyperlipidemia)   . HTN (hypertension)   . Hypothyroidism   . Hypothyroidism   . Insomnia   . Normal nuclear stress test    Normal, 2001  . Osteoarthritis   . PONV (postoperative nausea and vomiting)   . Rectal bleeding 2001   diverticulosis and int hemorrhoids on 07/1999 and 02/2010 colonoscopies.  . Stroke ([place])   . Warfarin anticoagulation     Past Surgical History:  Procedure Laterality Date  . BIOPSY  09/29/2015   Procedure: BIOPSY;  Surgeon: Rogene Houston, MD;  [facility];  Service: Endoscopy;;  gastric  . CATARACT EXTRACTION W/PHACO Right 04/15/2014   Procedure: CATARACT EXTRACTION PHACO AND INTRAOCULAR LENS PLACEMENT (IOC);  Surgeon: Tonny Branch, MD;  Location: [facility];  Service: Ophthalmology;  Laterality: Right;  CDE: 13.30  . CATARACT EXTRACTION W/PHACO Left 05/13/2014   Procedure: CATARACT EXTRACTION PHACO AND  INTRAOCULAR LENS PLACEMENT LEFT EYE;  Surgeon: Tonny Branch, MD;  Location: [facility];  Service: Ophthalmology;  Laterality: Left;  CDE:13.00  . CHOLECYSTECTOMY N/A 01/15/2015   Procedure: LAPAROSCOPIC CHOLECYSTECTOMY;  Surgeon: Ralene Ok, MD;  Location: [facility];  Service: General;  Laterality: N/A;  . COLONOSCOPY N/A 09/29/2015   Procedure: COLONOSCOPY;  Surgeon: Rogene Houston, MD;  [facility];  Service: Endoscopy;  Laterality: N/A;  . COLONOSCOPY    . ESOPHAGOGASTRODUODENOSCOPY N/A 01/13/2015   Procedure: ESOPHAGOGASTRODUODENOSCOPY (EGD);  Surgeon: Jerene Bears, MD;  Location: [facility] ENDOSCOPY;  Service: Endoscopy;  Laterality: N/A;  . ESOPHAGOGASTRODUODENOSCOPY N/A 09/29/2015   Procedure: ESOPHAGOGASTRODUODENOSCOPY (EGD);  Surgeon: Rogene Houston, MD;  [facility];  Service: Endoscopy;  Laterality: N/A;  12:15  . GALLBLADDER SURGERY    . SHOULDER OPEN ROTATOR CUFF REPAIR     bilateral  . TOTAL KNEE ARTHROPLASTY  05/26/10   right  . TOTAL KNEE ARTHROPLASTY  11/17/2010   Procedure: TOTAL KNEE ARTHROPLASTY;  Surgeon: Mauri Pole;  Location: [facility];  Service: Orthopedics;  Laterality: Left;  . TOTAL KNEE ARTHROPLASTY     Right  . TOTAL SHOULDER REPLACEMENT  01/2010   left    There were no vitals filed for this visit.       Subjective Assessment - 04/07/16 1038    Subjective Pt presents s/p  left lesser toe amputation 02/2016. Pt also with falls and decreased balance. Pt states she has had pain in her toes and feels "off balance"   Limitations Standing;Walking   How long can you stand comfortably? 1 hour   How long can you walk comfortably? 1 hour   Patient Stated Goals improve strength, gait and decrease pain   Currently in Pain? Yes   Pain Score 3    Pain Location Foot   Pain Orientation Left   Pain Descriptors / Indicators Sore   Pain Type Surgical pain   Pain Onset More than a month ago   Aggravating Factors  prolonged standing and walking   Effect of Pain  on Daily Activities more frequent rest breaks needed            [facility] PT Assessment - 04/07/16 0001      Assessment   Medical Diagnosis left lesser toe amputation   Referring Provider Wylene Simmer     Precautions   Precautions None     Restrictions   Weight Bearing Restrictions No     Balance Screen   Has the patient fallen in the past 6 months Yes   How many times? 1     Cognition   Behaviors --  impaired memory     Sensation   Light Touch Impaired Detail   Light Touch Impaired Details --  impaired bilat feet and toes     ROM / Strength   AROM / PROM / Strength Strength     Strength   Overall Strength Comments knee strength grossly 4/5 bilat, hip strength grossly 3/5 bilat, ankle strength grossly 3+/5 bilat     Palpation   Palpation comment tender to touch on Lt LE     Transfers   Five time sit to stand comments  17.5 seconds     Standardized Balance Assessment   Standardized Balance Assessment Berg Balance Test     Berg Balance Test   Sit to Stand Able to stand without using hands and stabilize independently   Standing Unsupported Able to stand safely 2 minutes   Sitting with Back Unsupported but Feet Supported on Floor or Stool Able to sit safely and securely 2 minutes   Stand to Sit Sits safely with minimal use of hands   Transfers Able to transfer safely, minor use of hands   Standing Unsupported with Eyes Closed Able to stand 10 seconds safely   Standing Ubsupported with Feet Together Able to place feet together independently and stand 1 minute safely   From Standing, Reach Forward with Outstretched Arm Can reach forward >12 cm safely (5")   From Standing Position, Pick up Object from Floor Able to pick up shoe safely and easily   From Standing Position, Turn to Look Behind Over each Shoulder Looks behind from both sides and weight shifts well   Turn 360 Degrees Able to turn 360 degrees safely but slowly   Standing Unsupported, Alternately Place Feet  on Step/Stool Able to stand independently and complete 8 steps >20 seconds   Standing Unsupported, One Foot in Front Able to take small step independently and hold 30 seconds   Standing on One Leg Tries to lift leg/unable to hold 3 seconds but remains standing independently   Total Score 47                           PT Education - 04/07/16 1057    Education  provided Yes   Education Details HEP, PT POC   Person(s) Educated Patient   Methods Explanation;Demonstration;Handout   Comprehension Verbalized understanding;Returned demonstration             PT Long Term Goals - 04/07/16 1113      PT LONG TERM GOAL #1   Title Pt will be independent in HEP   Time 8   Period Weeks   Status New     PT LONG TERM GOAL #2   Title Pt will improve BERG balance test to 51/56 to demo improved balance and decreased risk of falls   Time 8   Period Weeks   Status New     PT LONG TERM GOAL #3   Title Pt will improve 5 time sit to stand test to <15 seconds to demonstrate decreased risk of falls   Time 8   Period Weeks   Status New               Plan - 04/07/16 1110    Clinical Impression Statement Pt presents with balance and hip strength deficits with increased risk of falls. Pt will benefit from skilled PT to address deficits and improve functional mobility   Rehab Potential Good   PT Frequency 1x / week   PT Duration 8 weeks   PT Treatment/Interventions Cryotherapy;Moist Heat;DME Instruction;Gait training;Stair training;Functional mobility training;Therapeutic activities;Patient/family education;Manual techniques;Neuromuscular re-education;Balance training;Therapeutic exercise;Taping;Vestibular   PT Next Visit Plan assess HEP, reassess balance and strength   PT Home Exercise Plan standing and seated LE strengthening   Consulted and Agree with Plan of Care Patient      Patient will benefit from skilled therapeutic intervention in order to improve the following  deficits and impairments:  Decreased balance, Difficulty walking, Pain, Decreased strength, Decreased activity tolerance  Visit Diagnosis: Muscle weakness (generalized) - Plan: PT plan of care cert/re-cert  Balance problem - Plan: PT plan of care cert/re-cert      G-Codes - 70/48/88 1116    Functional Assessment Tool Used (Outpatient Only) BERG balance test   Functional Limitation Mobility: Walking and moving around   Mobility: Walking and Moving Around Current Status (B1694) At least 1 percent but less than 20 percent impaired, limited or restricted   Mobility: Walking and Moving Around Goal Status (H0388) At least 1 percent but less than 20 percent impaired, limited or restricted       Problem List Patient Active Problem List   Diagnosis Date Noted  . AP (abdominal pain)   . Melena 09/23/2015  . Overactive bladder 05/02/2015  . Acute cerebrovascular accident (CVA) ([place]) 04/21/2015  . Acute cardioembolic stroke ([place]) 82/80/0349  . Healthcare maintenance 02/20/2015  . History of dysarthria 02/20/2015  . Enteritis due to Clostridium difficile 01/27/2015  . HTN (hypertension) 01/11/2015  . Hyponatremia 01/11/2015  . Rectal bleeding 01/11/2015  . Chronic pain syndrome 11/25/2014  . Insomnia 11/25/2014  . Chronic ulcer of toe ([place]) 11/13/2014  . Diastolic dysfunction 17/91/5056  . Paroxysmal atrial fibrillation ([place]) 10/14/2014  . Warfarin anticoagulation   . Hypothyroidism   . Carotid bruit   . Normal nuclear stress test   . S/P knee replacement   . UNSPECIFIED ANEMIA 01/19/2010  . DIVERTICULOSIS-COLON 01/19/2010    Isabelle Course, PT, DPT 04/07/2016, 11:18 AM  [facility] [address] [place], [place], [address] Phone: [phone]   Fax:  [phone]  Name: JOEANN STEPPE MRN: [id number] Date of Birth: 1932-08-30  PHYSICAL THERAPY DISCHARGE SUMMARY  Visits from Start of Care: 1.  Current functional level related to  goals / functional outcomes: See above.   Remaining deficits: Pt did not return.   Education / Equipment:  Plan: Patient agrees to discharge.  Patient goals were not met. Patient is being discharged due to not returning since the last visit.  ?????         Mali Applegate MPT

## 2016-04-07 NOTE — Patient Instructions (Addendum)
ABDUCTION: Standing - Resistance Band (Active)   Stand, feet flat. Against yellow resistance band, lift right leg out to side. Complete ___ sets of ___ repetitions. Perform ___ sessions per day.  ADDUCTION: Standing - Stable: Resistance Band (Active)   Stand, right leg out to side as far as possible. Against yellow resistance band, draw leg in across midline. Complete ___ sets of ___ repetitions. Perform ___ sessions per day.  Strengthening: Hip Flexion - Resisted   With tubing around left ankle, anchor behind, bring leg forward, keeping knee straight. Repeat ____ times per set. Do ____ sets per session. Do ____ sessions per day.  Strengthening: Hip Extension - Resisted   With tubing around right ankle, face anchor and pull leg straight back. Repeat ____ times per set. Do ____ sets per session. Do ____ sessions per day.  Knee Extension (Sitting)    Place ____ pound weight on left ankle and straighten knee fully, lower slowly. Repeat ____ times per set. Do ____ sets per session. Do ____ sessions per day.  http://orth.exer.us/732     Copyright  VHI. All rights reserved.  Heel Raise: Bilateral (Standing)    Rise on balls of feet. Repeat ____ times per set. Do ____ sets per session. Do ____ sessions per day.  http://orth.exer.us/38   Copyright  VHI. All rights reserved.

## 2016-04-08 ENCOUNTER — Encounter: Payer: Self-pay | Admitting: Pharmacist

## 2016-04-12 ENCOUNTER — Ambulatory Visit (INDEPENDENT_AMBULATORY_CARE_PROVIDER_SITE_OTHER): Payer: Medicare Other | Admitting: Pharmacist

## 2016-04-12 ENCOUNTER — Other Ambulatory Visit: Payer: Self-pay | Admitting: Family Medicine

## 2016-04-12 DIAGNOSIS — I480 Paroxysmal atrial fibrillation: Secondary | ICD-10-CM | POA: Diagnosis not present

## 2016-04-12 LAB — COAGUCHEK XS/INR WAIVED
INR: 2.8 — ABNORMAL HIGH (ref 0.9–1.1)
Prothrombin Time: 33.1 s

## 2016-04-12 MED ORDER — OXYBUTYNIN CHLORIDE 5 MG PO TABS: 5.0000 mg | ORAL_TABLET | Freq: Three times a day (TID) | ORAL | 0 refills | Status: DC

## 2016-04-19 ENCOUNTER — Encounter: Payer: Self-pay | Admitting: Nurse Practitioner

## 2016-04-19 ENCOUNTER — Ambulatory Visit (INDEPENDENT_AMBULATORY_CARE_PROVIDER_SITE_OTHER): Payer: Medicare Other | Admitting: Nurse Practitioner

## 2016-04-19 VITALS — BP 168/80 | HR 64 | Temp 97.1°F | Ht 62.0 in | Wt 131.0 lb

## 2016-04-19 DIAGNOSIS — R300 Dysuria: Secondary | ICD-10-CM | POA: Diagnosis not present

## 2016-04-19 LAB — URINALYSIS, COMPLETE
BILIRUBIN UA: NEGATIVE
Glucose, UA: NEGATIVE
KETONES UA: NEGATIVE
Leukocytes, UA: NEGATIVE
NITRITE UA: NEGATIVE
Protein, UA: NEGATIVE
SPEC GRAV UA: 1.01 (ref 1.005–1.030)
UUROB: 0.2 mg/dL (ref 0.2–1.0)
pH, UA: 7.5 (ref 5.0–7.5)

## 2016-04-19 LAB — MICROSCOPIC EXAMINATION
Bacteria, UA: NONE SEEN
Renal Epithel, UA: NONE SEEN /hpf
WBC, UA: NONE SEEN /hpf (ref 0–?)

## 2016-04-19 NOTE — Patient Instructions (Signed)

## 2016-04-19 NOTE — Progress Notes (Signed)
   Subjective:    Patient ID: [name], female    DOB: Feb 07, 1932, 83 y.o.   MRN: [id number]  HPI  Patient comes in today c/o dysuria with urgency. SHe denies frequency but says is only voiding a scant amount.    Review of Systems  Constitutional: Negative for chills and fever.  Respiratory: Negative.   Cardiovascular: Negative.   Gastrointestinal: Negative for nausea and vomiting.  Genitourinary: Positive for dysuria, frequency, pelvic pain and urgency.  Neurological: Negative.   Psychiatric/Behavioral: Negative.   All other systems reviewed and are negative.      Objective:   Physical Exam  Constitutional: She is oriented to person, place, and time. She appears well-developed and well-nourished. No distress.  Cardiovascular: Normal rate and regular rhythm.   Pulmonary/Chest: Effort normal and breath sounds normal.  Abdominal: Soft. Bowel sounds are normal.  Neurological: She is alert and oriented to person, place, and time.  Skin: Skin is warm.  Psychiatric: She has a normal mood and affect. Her behavior is normal. Judgment and thought content normal.   BP (!) 168/80   Pulse 64   Temp 97.1 F (36.2 C) (Oral)   Ht 5\' 2"  (1.575 m)   Wt 131 lb (59.4 kg)   BMI 23.96 kg/m   UA- no infection    Assessment & Plan:   1. Dysuria    Force fluids- cranberry juice daily RTO if symptoms dont resolve  Mary-Margaret Hassell Done, FNP

## 2016-04-22 ENCOUNTER — Encounter: Payer: Self-pay | Admitting: Family

## 2016-04-22 ENCOUNTER — Ambulatory Visit (INDEPENDENT_AMBULATORY_CARE_PROVIDER_SITE_OTHER): Payer: Medicare Other | Admitting: Family

## 2016-04-22 VITALS — BP 156/72 | HR 60 | Temp 98.4°F | Ht 62.0 in | Wt 132.0 lb

## 2016-04-22 DIAGNOSIS — N3001 Acute cystitis with hematuria: Secondary | ICD-10-CM | POA: Diagnosis not present

## 2016-04-22 DIAGNOSIS — R350 Frequency of micturition: Secondary | ICD-10-CM | POA: Diagnosis not present

## 2016-04-22 LAB — URINALYSIS, COMPLETE
BILIRUBIN UA: NEGATIVE
GLUCOSE, UA: NEGATIVE
KETONES UA: NEGATIVE
Leukocytes, UA: NEGATIVE
NITRITE UA: NEGATIVE
Protein, UA: NEGATIVE
SPEC GRAV UA: 1.015 (ref 1.005–1.030)
UUROB: 0.2 mg/dL (ref 0.2–1.0)
pH, UA: 7 (ref 5.0–7.5)

## 2016-04-22 LAB — MICROSCOPIC EXAMINATION: BACTERIA UA: NONE SEEN

## 2016-04-22 MED ORDER — NITROFURANTOIN MONOHYD MACRO 100 MG PO CAPS: 100.0000 mg | ORAL_CAPSULE | Freq: Two times a day (BID) | ORAL | 0 refills | Status: DC

## 2016-04-22 NOTE — Patient Instructions (Signed)

## 2016-04-22 NOTE — Progress Notes (Addendum)
   Subjective:    Patient ID: [name], female    DOB: 1932/04/04, 83 y.o.   MRN: [id number]  Pt was seen in the office on 04/19/16 with UTI symptoms, but had negaitve Urinalysis. Pt states her symptoms have become worse.  Dysuria   The current episode started in the past 7 days. The problem occurs every urination. The problem has been gradually worsening. The quality of the pain is described as burning. The pain is at a severity of 8/10. The pain is mild. Associated symptoms include flank pain, frequency, hematuria and urgency. Pertinent negatives include no discharge, nausea or vomiting. She has tried increased fluids for the symptoms. The treatment provided no relief.      Review of Systems  Gastrointestinal: Negative for nausea and vomiting.  Genitourinary: Positive for dysuria, flank pain, frequency, hematuria and urgency.  All other systems reviewed and are negative.      Objective:   Physical Exam  Constitutional: She is oriented to person, place, and time. She appears well-developed and well-nourished. No distress.  HENT:  Head: Normocephalic.  Eyes: Pupils are equal, round, and reactive to light.  Neck: Normal range of motion. Neck supple. No thyromegaly present.  Cardiovascular: Normal rate, regular rhythm, normal heart sounds and intact distal pulses.   No murmur heard. Pulmonary/Chest: Effort normal and breath sounds normal. No respiratory distress. She has no wheezes.  Abdominal: Soft. Bowel sounds are normal. She exhibits no distension. There is tenderness (lower abd tenderness).  Musculoskeletal: Normal range of motion. She exhibits no edema or tenderness.  Neurological: She is alert and oriented to person, place, and time.  Skin: Skin is warm and dry.  Psychiatric: She has a normal mood and affect. Her behavior is normal. Judgment and thought content normal.  Vitals reviewed.   BP (!) 156/72   Pulse 60   Temp 98.4 F (36.9 C) (Oral)   Ht 5\' 2"  (1.575 m)    Wt 132 lb (59.9 kg)   BMI 24.14 kg/m      Assessment & Plan:  1. Urine frequency - Urinalysis, Complete - Urine culture  2. Acute cystitis with hematuria Force fluids AZO over the counter X2 days RTO prn Culture pending - nitrofurantoin, macrocrystal-monohydrate, (MACROBID) 100 MG capsule; Take 1 capsule (100 mg total) by mouth 2 (two) times daily.  Dispense: 10 capsule; Refill: 0  Will do urine culture since symptoms have worsen and start on macrobid until culture comes back  [name], [place]

## 2016-04-23 ENCOUNTER — Encounter (HOSPITAL_COMMUNITY): Payer: Self-pay

## 2016-04-23 ENCOUNTER — Inpatient Hospital Stay (HOSPITAL_COMMUNITY)
Admission: EM | Admit: 2016-04-23 | Discharge: 2016-04-26 | DRG: 313 | Disposition: A | Discharge status: Home / Self Care | Payer: Medicare Other | Attending: Family Medicine | Admitting: Family Medicine

## 2016-04-23 ENCOUNTER — Emergency Department (HOSPITAL_COMMUNITY): Payer: Medicare Other

## 2016-04-23 ENCOUNTER — Inpatient Hospital Stay (HOSPITAL_COMMUNITY): Payer: Medicare Other

## 2016-04-23 DIAGNOSIS — E039 Hypothyroidism, unspecified: Secondary | ICD-10-CM | POA: Diagnosis present

## 2016-04-23 DIAGNOSIS — I10 Essential (primary) hypertension: Secondary | ICD-10-CM | POA: Diagnosis not present

## 2016-04-23 DIAGNOSIS — E785 Hyperlipidemia, unspecified: Secondary | ICD-10-CM | POA: Diagnosis present

## 2016-04-23 DIAGNOSIS — Z66 Do not resuscitate: Secondary | ICD-10-CM | POA: Diagnosis not present

## 2016-04-23 DIAGNOSIS — E871 Hypo-osmolality and hyponatremia: Secondary | ICD-10-CM | POA: Diagnosis not present

## 2016-04-23 DIAGNOSIS — I4892 Unspecified atrial flutter: Secondary | ICD-10-CM | POA: Diagnosis not present

## 2016-04-23 DIAGNOSIS — R079 Chest pain, unspecified: Secondary | ICD-10-CM | POA: Diagnosis not present

## 2016-04-23 DIAGNOSIS — I480 Paroxysmal atrial fibrillation: Secondary | ICD-10-CM | POA: Diagnosis not present

## 2016-04-23 DIAGNOSIS — I4891 Unspecified atrial fibrillation: Secondary | ICD-10-CM | POA: Diagnosis not present

## 2016-04-23 DIAGNOSIS — Z9842 Cataract extraction status, left eye: Secondary | ICD-10-CM

## 2016-04-23 DIAGNOSIS — Z96653 Presence of artificial knee joint, bilateral: Secondary | ICD-10-CM | POA: Diagnosis present

## 2016-04-23 DIAGNOSIS — Z96612 Presence of left artificial shoulder joint: Secondary | ICD-10-CM | POA: Diagnosis present

## 2016-04-23 DIAGNOSIS — N281 Cyst of kidney, acquired: Secondary | ICD-10-CM | POA: Diagnosis not present

## 2016-04-23 DIAGNOSIS — Z880 Allergy status to penicillin: Secondary | ICD-10-CM

## 2016-04-23 DIAGNOSIS — Z7901 Long term (current) use of anticoagulants: Secondary | ICD-10-CM

## 2016-04-23 DIAGNOSIS — N390 Urinary tract infection, site not specified: Secondary | ICD-10-CM | POA: Diagnosis not present

## 2016-04-23 DIAGNOSIS — G4700 Insomnia, unspecified: Secondary | ICD-10-CM | POA: Diagnosis not present

## 2016-04-23 DIAGNOSIS — Z961 Presence of intraocular lens: Secondary | ICD-10-CM | POA: Diagnosis present

## 2016-04-23 DIAGNOSIS — W19XXXA Unspecified fall, initial encounter: Secondary | ICD-10-CM

## 2016-04-23 DIAGNOSIS — E876 Hypokalemia: Secondary | ICD-10-CM | POA: Diagnosis not present

## 2016-04-23 DIAGNOSIS — Z9841 Cataract extraction status, right eye: Secondary | ICD-10-CM | POA: Diagnosis not present

## 2016-04-23 DIAGNOSIS — M1990 Unspecified osteoarthritis, unspecified site: Secondary | ICD-10-CM | POA: Diagnosis present

## 2016-04-23 DIAGNOSIS — Z79899 Other long term (current) drug therapy: Secondary | ICD-10-CM | POA: Diagnosis not present

## 2016-04-23 DIAGNOSIS — I440 Atrioventricular block, first degree: Secondary | ICD-10-CM | POA: Diagnosis present

## 2016-04-23 DIAGNOSIS — Z8673 Personal history of transient ischemic attack (TIA), and cerebral infarction without residual deficits: Secondary | ICD-10-CM | POA: Diagnosis not present

## 2016-04-23 DIAGNOSIS — R0789 Other chest pain: Principal | ICD-10-CM

## 2016-04-23 DIAGNOSIS — R072 Precordial pain: Secondary | ICD-10-CM | POA: Diagnosis not present

## 2016-04-23 DIAGNOSIS — B9620 Unspecified Escherichia coli [E. coli] as the cause of diseases classified elsewhere: Secondary | ICD-10-CM | POA: Diagnosis present

## 2016-04-23 DIAGNOSIS — K219 Gastro-esophageal reflux disease without esophagitis: Secondary | ICD-10-CM | POA: Diagnosis not present

## 2016-04-23 LAB — CBC WITH DIFFERENTIAL/PLATELET
Basophils Absolute: 0 10*3/uL (ref 0.0–0.1)
Basophils Relative: 0 %
EOS PCT: 1 %
Eosinophils Absolute: 0 10*3/uL (ref 0.0–0.7)
HCT: 38.4 % (ref 36.0–46.0)
Hemoglobin: 13.4 g/dL (ref 12.0–15.0)
LYMPHS ABS: 0.8 10*3/uL (ref 0.7–4.0)
LYMPHS PCT: 13 %
MCH: 32.6 pg (ref 26.0–34.0)
MCHC: 34.9 g/dL (ref 30.0–36.0)
MCV: 93.4 fL (ref 78.0–100.0)
MONO ABS: 0.7 10*3/uL (ref 0.1–1.0)
Monocytes Relative: 12 %
Neutro Abs: 4.7 10*3/uL (ref 1.7–7.7)
Neutrophils Relative %: 74 %
PLATELETS: 310 10*3/uL (ref 150–400)
RBC: 4.11 MIL/uL (ref 3.87–5.11)
RDW: 13.2 % (ref 11.5–15.5)
WBC: 6.3 10*3/uL (ref 4.0–10.5)

## 2016-04-23 LAB — BASIC METABOLIC PANEL
Anion gap: 11 (ref 5–15)
Anion gap: 11 (ref 5–15)
Anion gap: 11 (ref 5–15)
BUN: 6 mg/dL (ref 6–20)
BUN: 6 mg/dL (ref 6–20)
BUN: 7 mg/dL (ref 6–20)
CALCIUM: 8.4 mg/dL — AB (ref 8.9–10.3)
CHLORIDE: 83 mmol/L — AB (ref 101–111)
CHLORIDE: 81 mmol/L — AB (ref 101–111)
CO2: 28 mmol/L (ref 22–32)
CO2: 26 mmol/L (ref 22–32)
CO2: 26 mmol/L (ref 22–32)
Calcium: 8.3 mg/dL — ABNORMAL LOW (ref 8.9–10.3)
Calcium: 8.2 mg/dL — ABNORMAL LOW (ref 8.9–10.3)
Chloride: 82 mmol/L — ABNORMAL LOW (ref 101–111)
Creatinine, Ser: 0.57 mg/dL (ref 0.44–1.00)
Creatinine, Ser: 0.47 mg/dL (ref 0.44–1.00)
Creatinine, Ser: 0.56 mg/dL (ref 0.44–1.00)
GFR calc Af Amer: >60 mL/min (ref >60)
GFR calc Af Amer: >60 mL/min (ref >60)
GFR calc non Af Amer: >60 mL/min (ref >60)
GFR calc non Af Amer: >60 mL/min (ref >60)
GFR calc non Af Amer: >60 mL/min (ref >60)
Glucose, Bld: 121 mg/dL — ABNORMAL HIGH (ref 65–99)
Glucose, Bld: 119 mg/dL — ABNORMAL HIGH (ref 65–99)
Glucose, Bld: 129 mg/dL — ABNORMAL HIGH (ref 65–99)
POTASSIUM: 3.1 mmol/L — AB (ref 3.5–5.1)
POTASSIUM: 3.7 mmol/L (ref 3.5–5.1)
Potassium: 3.5 mmol/L (ref 3.5–5.1)
SODIUM: 120 mmol/L — AB (ref 135–145)
SODIUM: 118 mmol/L — AB (ref 135–145)
Sodium: 121 mmol/L — ABNORMAL LOW (ref 135–145)

## 2016-04-23 LAB — TROPONIN I: Troponin I: <0.03 ng/mL (ref <0.03)

## 2016-04-23 LAB — PROTIME-INR
INR: 2.18
Prothrombin Time: 24.6 seconds — ABNORMAL HIGH (ref 11.4–15.2)

## 2016-04-23 LAB — SODIUM, URINE, RANDOM: Sodium, Ur: 83 mmol/L

## 2016-04-23 LAB — I-STAT TROPONIN, ED: TROPONIN I, POC: 0 ng/mL (ref 0.00–0.08)

## 2016-04-23 LAB — OSMOLALITY: Osmolality: 249 mOsm/kg — CL (ref 275–295)

## 2016-04-23 LAB — MAGNESIUM: Magnesium: 1.6 mg/dL — ABNORMAL LOW (ref 1.7–2.4)

## 2016-04-23 LAB — TSH: TSH: 1.046 u[IU]/mL (ref 0.350–4.500)

## 2016-04-23 MED ORDER — ACETAMINOPHEN 650 MG RE SUPP: 650.0000 mg | Freq: Four times a day (QID) | RECTAL | Status: DC | PRN

## 2016-04-23 MED ORDER — LISINOPRIL 10 MG PO TABS
10.0000 mg | ORAL_TABLET | Freq: Every day | ORAL | Status: DC
  Administered 2016-04-24 – 2016-04-26 (×3): 10 mg via ORAL
  Filled 2016-04-23 (×3): qty 1

## 2016-04-23 MED ORDER — ACETAMINOPHEN 325 MG PO TABS
650.0000 mg | ORAL_TABLET | Freq: Once | ORAL | Status: AC
  Administered 2016-04-23: 650 mg via ORAL
  Filled 2016-04-23: qty 2

## 2016-04-23 MED ORDER — ALUM & MAG HYDROXIDE-SIMETH 200-200-20 MG/5ML PO SUSP
30.0000 mL | ORAL | Status: DC | PRN
  Administered 2016-04-23 – 2016-04-24 (×3): 30 mL via ORAL
  Filled 2016-04-23 (×3): qty 30

## 2016-04-23 MED ORDER — GI COCKTAIL ~~LOC~~
30.0000 mL | Freq: Once | ORAL | Status: AC
  Administered 2016-04-23: 30 mL via ORAL

## 2016-04-23 MED ORDER — GUAIFENESIN-DM 100-10 MG/5ML PO SYRP
5.0000 mL | ORAL_SOLUTION | ORAL | Status: DC | PRN
Start: 2016-04-23 — End: 2016-04-26
  Administered 2016-04-25 (×2): 5 mL via ORAL
  Filled 2016-04-23 (×3): qty 5

## 2016-04-23 MED ORDER — ATENOLOL 25 MG PO TABS
25.0000 mg | ORAL_TABLET | Freq: Every day | ORAL | Status: DC
  Administered 2016-04-24 – 2016-04-26 (×3): 25 mg via ORAL
  Filled 2016-04-23 (×3): qty 1

## 2016-04-23 MED ORDER — ACETAMINOPHEN 325 MG PO TABS
650.0000 mg | ORAL_TABLET | Freq: Four times a day (QID) | ORAL | Status: DC | PRN
  Administered 2016-04-23 – 2016-04-24 (×4): 650 mg via ORAL
  Filled 2016-04-23 (×5): qty 2

## 2016-04-23 MED ORDER — GI COCKTAIL ~~LOC~~
ORAL | Status: AC
  Administered 2016-04-23: 30 mL via ORAL
  Filled 2016-04-23: qty 30

## 2016-04-23 MED ORDER — ONDANSETRON HCL 4 MG PO TABS: 4.0000 mg | ORAL_TABLET | Freq: Four times a day (QID) | ORAL | Status: DC | PRN

## 2016-04-23 MED ORDER — SODIUM CHLORIDE 0.9 % IV SOLN
INTRAVENOUS | Status: AC
  Administered 2016-04-23: 16:00:00 via INTRAVENOUS

## 2016-04-23 MED ORDER — HYDRALAZINE HCL 20 MG/ML IJ SOLN
10.0000 mg | Freq: Once | INTRAMUSCULAR | Status: AC
  Administered 2016-04-23: 10 mg via INTRAVENOUS
  Filled 2016-04-23: qty 1

## 2016-04-23 MED ORDER — LEVOTHYROXINE SODIUM 50 MCG PO TABS
50.0000 ug | ORAL_TABLET | Freq: Every day | ORAL | Status: DC
  Administered 2016-04-24 – 2016-04-26 (×3): 50 ug via ORAL
  Filled 2016-04-23 (×3): qty 1

## 2016-04-23 MED ORDER — WARFARIN SODIUM 2.5 MG PO TABS: 2.5000 mg | ORAL_TABLET | Freq: Once | ORAL | Status: DC

## 2016-04-23 MED ORDER — LEVETIRACETAM 250 MG PO TABS
250.0000 mg | ORAL_TABLET | Freq: Two times a day (BID) | ORAL | Status: DC
  Administered 2016-04-23 – 2016-04-26 (×6): 250 mg via ORAL
  Filled 2016-04-23 (×6): qty 1

## 2016-04-23 MED ORDER — MAGNESIUM SULFATE 2 GM/50ML IV SOLN
2.0000 g | Freq: Once | INTRAVENOUS | Status: AC
  Administered 2016-04-23: 2 g via INTRAVENOUS
  Filled 2016-04-23: qty 50

## 2016-04-23 MED ORDER — WARFARIN - PHARMACIST DOSING INPATIENT
Status: DC
  Administered 2016-04-24: 16:00:00

## 2016-04-23 MED ORDER — POTASSIUM CHLORIDE CRYS ER 20 MEQ PO TBCR
40.0000 meq | EXTENDED_RELEASE_TABLET | Freq: Two times a day (BID) | ORAL | Status: DC
  Administered 2016-04-23 – 2016-04-24 (×3): 40 meq via ORAL
  Filled 2016-04-23 (×3): qty 2

## 2016-04-23 MED ORDER — FUROSEMIDE 20 MG PO TABS
20.0000 mg | ORAL_TABLET | Freq: Every day | ORAL | Status: DC
  Administered 2016-04-24: 20 mg via ORAL
  Filled 2016-04-23 (×2): qty 1

## 2016-04-23 MED ORDER — ACETAMINOPHEN 325 MG PO TABS: 650.0000 mg | ORAL_TABLET | ORAL | Status: DC | PRN

## 2016-04-23 MED ORDER — ONDANSETRON HCL 4 MG/2ML IJ SOLN: 4.0000 mg | Freq: Four times a day (QID) | INTRAMUSCULAR | Status: DC | PRN

## 2016-04-23 MED ORDER — POLYETHYLENE GLYCOL 3350 17 G PO PACK: 17.0000 g | PACK | Freq: Every day | ORAL | Status: DC | PRN

## 2016-04-23 MED ORDER — POTASSIUM CHLORIDE CRYS ER 20 MEQ PO TBCR
40.0000 meq | EXTENDED_RELEASE_TABLET | Freq: Once | ORAL | Status: AC
  Administered 2016-04-23: 40 meq via ORAL
  Filled 2016-04-23: qty 2

## 2016-04-23 MED ORDER — ENSURE ENLIVE PO LIQD: 237.0000 mL | Freq: Two times a day (BID) | ORAL | Status: DC

## 2016-04-23 MED ORDER — ATORVASTATIN CALCIUM 10 MG PO TABS
10.0000 mg | ORAL_TABLET | Freq: Every day | ORAL | Status: DC
  Administered 2016-04-23 – 2016-04-25 (×3): 10 mg via ORAL
  Filled 2016-04-23 (×3): qty 1

## 2016-04-23 MED ORDER — ONDANSETRON HCL 4 MG/2ML IJ SOLN
4.0000 mg | Freq: Four times a day (QID) | INTRAMUSCULAR | Status: DC | PRN
  Administered 2016-04-23: 4 mg via INTRAVENOUS
  Filled 2016-04-23 (×2): qty 2

## 2016-04-23 MED ORDER — TRAMADOL HCL 50 MG PO TABS
25.0000 mg | ORAL_TABLET | Freq: Three times a day (TID) | ORAL | Status: DC | PRN
  Administered 2016-04-23 (×2): 25 mg via ORAL
  Filled 2016-04-23 (×2): qty 1

## 2016-04-23 NOTE — Consult Note (Addendum)
CARDIOLOGY CONSULT NOTE   Patient ID: TELESIA ATES MRN: [id number] DOB/AGE: 08-03-1932 83 y.o.  Admit Date: 04/23/2016 Referring Physician: Nat Christen MD Primary Physician: Kenn File, MD Consulting Cardiologist: Percival Spanish MD Primary Cardiologist: Quay Burow MD Reason for Consultation: Chest Pain  Clinical Summary SHERLEY MCKENNEY is a 83 y.o. female who is being seen today for the evaluation of chest pain, at the request of Dr, Lacinda Axon, [place] physician. Patient has a history of Afib on coumadin,( followed by PCP) hypertension, and hyperlipidemia. .   She reports neck and shoulder pain and discomfort which began yesterday. She states it was severe. Presented to PCP office and was given abx for UTI. (She sees [facility]).  She was found to be hypertensive at that office visit. She was prescribed Macrobid, for UTI. Began to have chest pressure radiating to back along with shoulder and neck discomfort after taking medication. She describes the pain as pressure, heaviness but the neck pain as achy. Did not sleep well due to chest discomfort. Now describes it as gnawing pain, midsternal with ongoing neck and shoulder pain. She states the pain would last about 20 minutes. Took Tums for this with some relief. Put heating pads on neck and shoulders.   Due to unrelenting pain, she presented to ER. She took her morning medications. On arrival to ER, she was found to be hypertensive with BP 168/80. HR 64 bpm. Afebrile. She was found to be hyponatremic and hyperkalemic, NA 121; J+ 3.1, Creatinine 0.57, No evidence of anemia or thrombocytopenia. PT 24.6, INR 2.18. EKG revealed atrial flutter with LBBB, Compared to last EKG in 04/14/2015, LBBB is new. Troponin 0.00.    Allergies  Allergen Reactions  . Penicillins Rash and Other (See Comments)    Has patient had a PCN reaction causing immediate rash, facial/tongue/throat swelling, SOB or lightheadedness with hypotension: No Has patient had a PCN reaction  causing severe rash involving mucus membranes or skin necrosis: No Has patient had a PCN reaction that required hospitalization No Has patient had a PCN reaction occurring within the last 10 years: No If all of the above answers are "NO", then may proceed with Cephalosporin use.    Medications Scheduled Medications:    Infusions:    PRN Medications:     Past Medical History:  Diagnosis Date  . Anemia   . Atrial fibrillation (HCC)    Paroxysmal, Flecainide therapy  . Calf pain    September, 2012, at rest  . Cardiomegaly    EF normal, echo, 2003  //   EF 60-65%, echo, June 21, 5168, diastolic dysfunction,  . Carotid bruit    Doppler, December, 2009, no abnormality  . Cataract   . Diverticulosis   . GERD (gastroesophageal reflux disease)   . History of shingles   . HLD (hyperlipidemia)   . HTN (hypertension)   . Hypothyroidism   . Hypothyroidism   . Insomnia   . Normal nuclear stress test    Normal, 2001  . Osteoarthritis   . PONV (postoperative nausea and vomiting)   . Rectal bleeding 2001   diverticulosis and int hemorrhoids on 07/1999 and 02/2010 colonoscopies.  . Stroke ([place])   . Warfarin anticoagulation     Past Surgical History:  Procedure Laterality Date  . BIOPSY  09/29/2015   Procedure: BIOPSY;  Surgeon: Rogene Houston, MD;  Location: [facility];  Service: Endoscopy;;  gastric  . CATARACT EXTRACTION W/PHACO Right 04/15/2014   Procedure: CATARACT EXTRACTION PHACO  AND INTRAOCULAR LENS PLACEMENT (IOC);  Surgeon: Tonny Sharvil Hoey, MD;  Location: [facility];  Service: Ophthalmology;  Laterality: Right;  CDE: 13.30  . CATARACT EXTRACTION W/PHACO Left 05/13/2014   Procedure: CATARACT EXTRACTION PHACO AND INTRAOCULAR LENS PLACEMENT LEFT EYE;  Surgeon: Tonny Sherlon Nied, MD;  Location: [facility];  Service: Ophthalmology;  Laterality: Left;  CDE:13.00  . CHOLECYSTECTOMY N/A 01/15/2015   Procedure: LAPAROSCOPIC CHOLECYSTECTOMY;  Surgeon: Ralene Ok, MD;  Location: [facility];  Service:  General;  Laterality: N/A;  . COLONOSCOPY N/A 09/29/2015   Procedure: COLONOSCOPY;  Surgeon: Rogene Houston, MD;  [facility];  Service: Endoscopy;  Laterality: N/A;  . COLONOSCOPY    . ESOPHAGOGASTRODUODENOSCOPY N/A 01/13/2015   Procedure: ESOPHAGOGASTRODUODENOSCOPY (EGD);  Surgeon: Jerene Bears, MD;  Location: [facility];  Service: Endoscopy;  Laterality: N/A;  . ESOPHAGOGASTRODUODENOSCOPY N/A 09/29/2015   Procedure: ESOPHAGOGASTRODUODENOSCOPY (EGD);  Surgeon: Rogene Houston, MD;  Location: [facility];  Service: Endoscopy;  Laterality: N/A;  12:15  . GALLBLADDER SURGERY    . SHOULDER OPEN ROTATOR CUFF REPAIR     bilateral  . TOTAL KNEE ARTHROPLASTY  05/26/10   right  . TOTAL KNEE ARTHROPLASTY  11/17/2010   Procedure: TOTAL KNEE ARTHROPLASTY;  Surgeon: Mauri Pole;  Location: [facility];  Service: Orthopedics;  Laterality: Left;  . TOTAL KNEE ARTHROPLASTY     Right  . TOTAL SHOULDER REPLACEMENT  01/2010   left    Family History  Problem Relation Age of Onset  . COPD Brother   . Atrial fibrillation Brother   . Diabetes Brother   . Liver cancer Brother     Alcohol-related  . Colon cancer Brother   . Heart attack Father   . Rheum arthritis Mother   . Arthritis Mother   . Heart disease Mother   . Coronary artery disease Brother   . Heart attack Daughter   . Gallbladder disease Brother      Social History Ms. Yin reports that she has never smoked. She has never used smokeless tobacco. Ms. Hadley reports that she does not drink alcohol.  Review of Systems Complete review of systems are found to be negative unless outlined in H&P above.  Physical Examination Blood pressure (!) 166/75, pulse 60, temperature 98.4 F (36.9 C), temperature source Oral, resp. rate 17, SpO2 97 %. No intake or output data in the 24 hours ending 04/23/16 1030  Telemetry: Atrial flutter  GEN: Frail, continues to complain of neck, shoulder,and gnawing pain in chest.  HEENT:  Conjunctiva and lids normal, oropharynx clear with moist mucosa. Neck: Supple, no elevated JVP or carotid bruits, no thyromegaly. Lungs: Clear to auscultation, nonlabored breathing at rest. Cardiac: Regular rate and rhythm, no S3 or significant systolic murmur, no pericardial rub. Abdomen: Soft, nontender, no hepatomegaly, bowel sounds present, no guarding or rebound. Extremities: No pitting edema, distal pulses 2+. Skin: Warm and dry. Musculoskeletal: No kyphosis. Neuropsychiatric: Alert and oriented x3, affect grossly appropriate.  Prior Cardiac Testing/Procedures Echocardiogram 04/21/2015 Left ventricle: The cavity size was normal. There was mild focal   basal hypertrophy of the septum. Systolic function was vigorous.   The estimated ejection fraction was in the range of 65% to 70%.   Wall motion was normal; there were no regional wall motion   abnormalities. Doppler parameters are consistent with abnormal   left ventricular relaxation (grade 1 diastolic dysfunction). - Aortic valve: Trileaflet; mildly calcified leaflets. - Mitral valve: Calcified annulus. There was trivial regurgitation. - Left atrium:  The atrium was mildly dilated. - Right atrium: Central venous pressure (est): 3 mm Hg. - Atrial septum: No defect or patent foramen ovale was identified. - Tricuspid valve: There was trivial regurgitation. - Pulmonary arteries: PA peak pressure: 23 mm Hg (S). - Pericardium, extracardiac: There was no pericardial effusion.  Stress MPI:01/1999 No evidence of ischemia or infarction, hypertensive response to exercise.   Lab Results  Basic Metabolic Panel:  Recent Labs Lab 04/23/16 0626  NA 121*  K 3.1*  CL 82*  CO2 28  GLUCOSE 121*  BUN 6  CREATININE 0.57  CALCIUM 8.4*    Liver Function Tests: No results for input(s): AST, ALT, ALKPHOS, BILITOT, PROT, ALBUMIN in the last 168 hours.  CBC:  Recent Labs Lab 04/23/16 0626  WBC 6.3  NEUTROABS 4.7  HGB 13.4  HCT 38.4   MCV 93.4  PLT 310    Radiology: Dg Chest 2 View  Result Date: 04/23/2016 CLINICAL DATA:  Tightness in the chest EXAM: CHEST  2 VIEW COMPARISON:  08/23/2015 FINDINGS: Cardiac shadow is within normal limits. The lungs are well aerated bilaterally. Mild bibasilar atelectatic changes are seen. Postsurgical changes in left shoulder are noted. IMPRESSION: Mild bibasilar atelectasis. Electronically Signed   By: Inez Catalina M.D.   On: 04/23/2016 07:15     ECG: Atrial flutter with LBBB.    Impression and Recommendations  1. Chest Pain: Typical and atypical features, described as neck and shoulder aching, with chest pressure through to back. No associated dyspnea, diaphoresis or weakness. Has been occurring since yesterday. Saw PCP and placed on antibiotics for UTI. Chest pressure started after taking medication. In ER chest discomfort has changed to a gnawing feeling, with ongoing neck and shoulder pain.   EKG demonstrates new LBBB not found on EKG in 04/2015. Troponin is negative so far. CVRF: Hypertension, Age, Thyroid disease, Hypercholesterolemia. If needs transfer to [facility] for cath, she will need to have coumadin reversed, as INR 2.1 this am.   Repeating EKG and troponin stat.    2. Hypertension: BP is elevated this am in ER. She has taken medications this am. She is on tenormin, lisinopril/   3. Hypokalemia: Potassium is 3.3. With replete.   4. Hyponatremia: On diuretics. No evidence of CHF on CXR or exam. Will hold lasix for now.   5. Atrial flutter: On coumadin and BB, along with flecainide. Will hold Flecainide until she is ruled out for CAD.   Discussed with Dr. Harl Bowie. He will see and make recommendations for admission to [facility] or transfer to [facility].     Signed: Phill Myron. Lawrence NP [facility]  04/23/2016, 10:30 AM Co-Sign MD  Attending note Patient seen and discussed with NP Purcell Nails, I agree with her documentation above. 83 yo female history of PAF, possible TIAs, HTN, HL, PVCs  admitted with chest pain.  She describes the onset of epigastric pain 10/10, along with neck and bilateral shoulder pain. Pains has fluctuated to some degree but as been constant for over 24 hours. Some relief with heating pads at home. Increased belching with some improvement.   Hgb 13.4 Plt 310 INR 2.18 Na 121 K 3.1 TSH 1  Trop neg x 1 CXR no acute process EKG initially SR with first degree AV block. Then afib rate 90 with LBBB, then SR with 1st degree AV block. No specific ischemia changes  Atypical chest pain. Of note she did have an EKG that showed LBBB earlier this morning that has resolved. Nezymes have been  negative over a 6 hr period, her symptoms and presentation are not consistent with acute infarct. Suspect rate related LBBB as at the time she was in afib rate 90, with rates in 60s she is SR without LBBB. Recommend admit overnight with cycling of EKGs and enzymes, will give trial of GI cocktail. Obtain echo.   Regarding her afib, she has evidence of conduction disease with her 1st degree av block as well as a rate related LBBB. Given these findings will d/c flecanide, continue rate control only.  Hyponatremia management per primary team. Keep K at 4 and Mg at 2. Hypertensive in ER, will write for 10mg  of IV hydralazine. Continue home meds.   Carlyle Dolly MD

## 2016-04-23 NOTE — ED Provider Notes (Addendum)
[facility] DEPT Provider Note   CSN: [id number] Arrival date & time: 04/23/16  1655  By signing my name below, I, Jaquelyn Bitter., attest that this documentation has been prepared under the direction and in the presence of Nat Christen, MD. Electronically signed: Jaquelyn Bitter., ED Scribe. 04/23/16. 12:37 PM.   History   Chief Complaint Chief Complaint  Patient presents with  . Chest Pain    HPI  [name] is a 83 y.o. female with hx of A-fib, blood thinner use who presents to the Emergency Department complaining of chest pain with onset x1 day. Pt reports with anterior chest pain radiating to the back that began yesterday. Pt describes the pain as aching. Pt was seen at PCP this week and diagnosed with a UTI, at this time she was prescribed Macrobid. Pt denies any modifying factors. She denies diaphoresis, nausea. Of note, pt is on coumadin.    The history is provided by the patient. No language interpreter was used.    Past Medical History:  Diagnosis Date  . Anemia   . Atrial fibrillation (HCC)    Paroxysmal, Flecainide therapy  . Calf pain    September, 2012, at rest  . Cardiomegaly    EF normal, echo, 2003  //   EF 60-65%, echo, June 22, 3746, diastolic dysfunction,  . Carotid bruit    Doppler, December, 2009, no abnormality  . Cataract   . Diverticulosis   . GERD (gastroesophageal reflux disease)   . History of shingles   . HLD (hyperlipidemia)   . HTN (hypertension)   . Hypothyroidism   . Hypothyroidism   . Insomnia   . Normal nuclear stress test    Normal, 2001  . Osteoarthritis   . PONV (postoperative nausea and vomiting)   . Rectal bleeding 2001   diverticulosis and int hemorrhoids on 07/1999 and 02/2010 colonoscopies.  . Stroke ([place])   . Warfarin anticoagulation     Patient Active Problem List   Diagnosis Date Noted  . AP (abdominal pain)   . Melena 09/23/2015  . Overactive bladder 05/02/2015  . Acute cerebrovascular  accident (CVA) ([place]) 04/21/2015  . Acute cardioembolic stroke ([place]) 27/07/8673  . Healthcare maintenance 02/20/2015  . History of dysarthria 02/20/2015  . Enteritis due to Clostridium difficile 01/27/2015  . HTN (hypertension) 01/11/2015  . Hyponatremia 01/11/2015  . Rectal bleeding 01/11/2015  . Chronic pain syndrome 11/25/2014  . Insomnia 11/25/2014  . Chronic ulcer of toe ([place]) 11/13/2014  . Diastolic dysfunction 44/92/0100  . Paroxysmal atrial fibrillation ([place]) 10/14/2014  . Warfarin anticoagulation   . Hypothyroidism   . Carotid bruit   . Normal nuclear stress test   . S/P knee replacement   . UNSPECIFIED ANEMIA 01/19/2010  . DIVERTICULOSIS-COLON 01/19/2010    Past Surgical History:  Procedure Laterality Date  . BIOPSY  09/29/2015   Procedure: BIOPSY;  Surgeon: Rogene Houston, MD;  Location: [facility];  Service: Endoscopy;;  gastric  . CATARACT EXTRACTION W/PHACO Right 04/15/2014   Procedure: CATARACT EXTRACTION PHACO AND INTRAOCULAR LENS PLACEMENT (IOC);  Surgeon: Tonny Branch, MD;  Location: [facility] ORS;  Service: Ophthalmology;  Laterality: Right;  CDE: 13.30  . CATARACT EXTRACTION W/PHACO Left 05/13/2014   Procedure: CATARACT EXTRACTION PHACO AND INTRAOCULAR LENS PLACEMENT LEFT EYE;  Surgeon: Tonny Branch, MD;  Location: [facility];  Service: Ophthalmology;  Laterality: Left;  CDE:13.00  . CHOLECYSTECTOMY N/A 01/15/2015   Procedure: LAPAROSCOPIC CHOLECYSTECTOMY;  Surgeon: Ralene Ok, MD;  Location: [facility] OR;  Service: General;  Laterality: N/A;  . COLONOSCOPY N/A 09/29/2015   Procedure: COLONOSCOPY;  Surgeon: Rogene Houston, MD;  Location[facility];  Service: Endoscopy;  Laterality: N/A;  . COLONOSCOPY    . ESOPHAGOGASTRODUODENOSCOPY N/A 01/13/2015   Procedure: ESOPHAGOGASTRODUODENOSCOPY (EGD);  Surgeon: Jerene Bears, MD;  Location: [facility] ENDOSCOPY;  Service: Endoscopy;  Laterality: N/A;  . ESOPHAGOGASTRODUODENOSCOPY N/A 09/29/2015   Procedure: ESOPHAGOGASTRODUODENOSCOPY (EGD);   Surgeon: Rogene Houston, MD;  Location[facility];  Service: Endoscopy;  Laterality: N/A;  12:15  . GALLBLADDER SURGERY    . SHOULDER OPEN ROTATOR CUFF REPAIR     bilateral  . TOTAL KNEE ARTHROPLASTY  05/26/10   right  . TOTAL KNEE ARTHROPLASTY  11/17/2010   Procedure: TOTAL KNEE ARTHROPLASTY;  Surgeon: Mauri Pole;  Location: [facility] ORS;  Service: Orthopedics;  Laterality: Left;  . TOTAL KNEE ARTHROPLASTY     Right  . TOTAL SHOULDER REPLACEMENT  01/2010   left    OB History    Gravida Para Term Preterm AB Living   1 1 1          SAB TAB Ectopic Multiple Live Births                   Home Medications    Prior to Admission medications   Medication Sig Start Date End Date Taking? Authorizing Provider  atenolol (TENORMIN) 25 MG tablet Take 25 mg by mouth daily.    Yes Historical Provider, MD  atorvastatin (LIPITOR) 20 MG tablet Take 0.5 tablets (10 mg total) by mouth at bedtime. 11/13/15  Yes Cherre Robins, PharmD  flecainide (TAMBOCOR) 100 MG tablet TAKE 1 TABLET BY MOUTH TWO  TIMES DAILY 02/23/16  Yes Timmothy Euler, MD  furosemide (LASIX) 40 MG tablet Take 20 mg by mouth daily.   Yes Historical Provider, MD  levETIRAcetam (KEPPRA) 500 MG tablet Take 250 mg by mouth 2 (two) times daily.   Yes Historical Provider, MD  levothyroxine (SYNTHROID, LEVOTHROID) 50 MCG tablet Take 1 tablet daily before breakfast. Must beseen before next refill 04/13/16  Yes Timmothy Euler, MD  lisinopril (PRINIVIL,ZESTRIL) 10 MG tablet Take 10 mg by mouth daily.   Yes Historical Provider, MD  nitrofurantoin, macrocrystal-monohydrate, (MACROBID) 100 MG capsule Take 1 capsule (100 mg total) by mouth 2 (two) times daily. 04/22/16  Yes Sharion Balloon, FNP  oxybutynin (DITROPAN) 5 MG tablet Take 1 tablet (5 mg total) by mouth 3 (three) times daily. 04/12/16  Yes Timmothy Euler, MD  potassium chloride SA (K-DUR,KLOR-CON) 20 MEQ tablet Take 20 mEq by mouth daily with breakfast.    Yes Historical Provider, MD    traMADol (ULTRAM) 50 MG tablet Take 0.5-1 tablets (25-50 mg total) by mouth every 8 (eight) hours as needed. 01/19/16  Yes Timmothy Euler, MD  vitamin B-12 (CYANOCOBALAMIN) 500 MCG tablet Take 500 mcg by mouth every Monday, Wednesday, and Friday.    Yes Historical Provider, MD  warfarin (COUMADIN) 5 MG tablet Take 1/2 tablet on mondays and fridays. Take 1 tablet all other days. 01/15/16  Yes Timmothy Euler, MD  zolpidem (AMBIEN) 5 MG tablet TAKE 1/2 TO 1 TABLET AT BEDTIME AS NEEDED FOR INSOMNIA 01/19/16  Yes Timmothy Euler, MD    Family History Family History  Problem Relation Age of Onset  . COPD Brother   . Atrial fibrillation Brother   . Diabetes Brother   . Liver cancer Brother  Alcohol-related  . Colon cancer Brother   . Heart attack Father   . Rheum arthritis Mother   . Arthritis Mother   . Heart disease Mother   . Coronary artery disease Brother   . Heart attack Daughter   . Gallbladder disease Brother     Social History Social History  Substance Use Topics  . Smoking status: Never Smoker  . Smokeless tobacco: Never Used  . Alcohol use No     Allergies   Penicillins   Review of Systems Review of Systems  All other systems reviewed and are negative.    Physical Exam Updated Vital Signs BP (!) 212/85 (BP Location: Right Arm)   Pulse 63   Temp 98.4 F (36.9 C) (Oral)   Resp 13   SpO2 97%   Physical Exam  Constitutional: She is oriented to person, place, and time. She appears well-developed and well-nourished.  HENT:  Head: Normocephalic and atraumatic.  Eyes: Conjunctivae are normal.  Neck: Neck supple.  Cardiovascular: Normal rate and regular rhythm.   Pulmonary/Chest: Effort normal and breath sounds normal.  Abdominal: Soft. Bowel sounds are normal.  Musculoskeletal: Normal range of motion.  Neurological: She is alert and oriented to person, place, and time.  Skin: Skin is warm and dry.  Psychiatric: She has a normal mood and affect.  Her behavior is normal.  Nursing note and vitals reviewed.    ED Treatments / Results   DIAGNOSTIC STUDIES: Oxygen Saturation is 97% on RA, adequate by my interpretation.   COORDINATION OF CARE: 12:37 PM-Discussed next steps with pt. Pt verbalized understanding and is agreeable with the plan.    Labs (all labs ordered are listed, but only abnormal results are displayed) Labs Reviewed  PROTIME-INR - Abnormal; Notable for the following:       Result Value   Prothrombin Time 24.6 (*)    All other components within normal limits  BASIC METABOLIC PANEL - Abnormal; Notable for the following:    Sodium 121 (*)    Potassium 3.1 (*)    Chloride 82 (*)    Glucose, Bld 121 (*)    Calcium 8.4 (*)    All other components within normal limits  MAGNESIUM - Abnormal; Notable for the following:    Magnesium 1.6 (*)    All other components within normal limits  CBC WITH DIFFERENTIAL/PLATELET  TSH  TROPONIN I  I-STAT TROPOININ, ED    EKG  EKG Interpretation  Date/Time:  Friday April 23 2016 06:23:15 EDT Ventricular Rate:  91 PR Interval:    QRS Duration: 201 QT Interval:  511 QTC Calculation: 629 R Axis:   -78 Text Interpretation:  Atrial flutter Left bundle branch block Abnormal ekg Confirmed by Christy Gentles  MD, DONALD ([id number]) on 04/23/2016 6:28:22 AM       Radiology Dg Chest 2 View  Result Date: 04/23/2016 CLINICAL DATA:  Tightness in the chest EXAM: CHEST  2 VIEW COMPARISON:  08/23/2015 FINDINGS: Cardiac shadow is within normal limits. The lungs are well aerated bilaterally. Mild bibasilar atelectatic changes are seen. Postsurgical changes in left shoulder are noted. IMPRESSION: Mild bibasilar atelectasis. Electronically Signed   By: Inez Catalina M.D.   On: 04/23/2016 07:15    Procedures Procedures (including critical care time)  Medications Ordered in ED Medications  hydrALAZINE (APRESOLINE) injection 10 mg (not administered)  acetaminophen (TYLENOL) tablet 650 mg (650  mg Oral Given 04/23/16 1025)  potassium chloride SA (K-DUR,KLOR-CON) CR tablet 40 mEq (40 mEq Oral Given 04/23/16  1142)  gi cocktail (Maalox,Lidocaine,Donnatal) (30 mLs Oral Given 04/23/16 1203)     Initial Impression / Assessment and Plan / ED Course  I have reviewed the triage vital signs and the nursing notes.  The plan is to do a cardiac evaluation.  Pertinent labs & imaging results that were available during my care of the patient were reviewed by me and considered in my medical decision making (see chart for details).     Patient presents with chest pain with radiation to back. She is hemodynamically stable. EKG shows no acute changes. Will consult cardiology. Admit to general medicine.  Final Clinical Impressions(s) / ED Diagnoses   Final diagnoses:  Chest pain, unspecified type    New Prescriptions New Prescriptions   No medications on file   I personally performed the services described in this documentation, which was scribed in my presence. The recorded information has been reviewed and is accurate.      Nat Christen, MD 04/23/16 [place], MD 04/23/16 [phone]

## 2016-04-23 NOTE — Progress Notes (Signed)
Critical Labs: Sodium 118 & Urine Osmolality 249. MD made aware. No new orders at this time. Will continue to monitor.

## 2016-04-23 NOTE — ED Notes (Signed)
Attempted to call report, RN unavailable at this time.

## 2016-04-23 NOTE — Progress Notes (Signed)
**Note De-Identified  Obfuscation** EKG complete, results reported to RN and placed in chart

## 2016-04-23 NOTE — ED Notes (Signed)
Pt transitioned from a flutter back to a fib

## 2016-04-23 NOTE — ED Triage Notes (Signed)
Pt reports a fullness and some tightness across her chest that started this am.  Pt states she has been burping some and having some acid reflux

## 2016-04-23 NOTE — Progress Notes (Signed)
ANTICOAGULATION CONSULT NOTE - Initial Consult  Pharmacy Consult for COUMADIN Indication: atrial fibrillation  Allergies  Allergen Reactions  . Penicillins Rash and Other (See Comments)    Has patient had a PCN reaction causing immediate rash, facial/tongue/throat swelling, SOB or lightheadedness with hypotension: No Has patient had a PCN reaction causing severe rash involving mucus membranes or skin necrosis: No Has patient had a PCN reaction that required hospitalization No Has patient had a PCN reaction occurring within the last 10 years: No If all of the above answers are "NO", then may proceed with Cephalosporin use.    Patient Measurements: Height: 5\' 3"  (160 cm) Weight: 127 lb 3.3 oz (57.7 kg) IBW/kg (Calculated) : 52.4  Vital Signs: Temp: 98.7 F (37.1 C) (04/20 1500) Temp Source: Oral (04/20 1500) BP: 169/60 (04/20 1500) Pulse Rate: 65 (04/20 1500)  Labs:  Recent Labs  04/23/16 0626 04/23/16 1108  HGB 13.4  --   HCT 38.4  --   PLT 310  --   LABPROT 24.6*  --   INR 2.18  --   CREATININE 0.57  --   TROPONINI  --  <0.03    Estimated Creatinine Clearance: 44.8 mL/min (by C-G formula based on SCr of 0.57 mg/dL).   Medical History: Past Medical History:  Diagnosis Date  . Anemia   . Atrial fibrillation (HCC)    Paroxysmal, Flecainide therapy  . Calf pain    September, 2012, at rest  . Cardiomegaly    EF normal, echo, 2003  //   EF 60-65%, echo, June 21, 173, diastolic dysfunction,  . Carotid bruit    Doppler, December, 2009, no abnormality  . Cataract   . Diverticulosis   . GERD (gastroesophageal reflux disease)   . History of shingles   . HLD (hyperlipidemia)   . HTN (hypertension)   . Hypothyroidism   . Hypothyroidism   . Insomnia   . Normal nuclear stress test    Normal, 2001  . Osteoarthritis   . PONV (postoperative nausea and vomiting)   . Rectal bleeding 2001   diverticulosis and int hemorrhoids on 07/1999 and 02/2010 colonoscopies.  .  Stroke ([place])   . Warfarin anticoagulation     Medications:  Prescriptions Prior to Admission  Medication Sig Dispense Refill Last Dose  . atenolol (TENORMIN) 25 MG tablet Take 25 mg by mouth daily.    04/23/2016 at 0400  . atorvastatin (LIPITOR) 20 MG tablet Take 0.5 tablets (10 mg total) by mouth at bedtime. 45 tablet 0 04/22/2016 at Unknown time  . flecainide (TAMBOCOR) 100 MG tablet TAKE 1 TABLET BY MOUTH TWO  TIMES DAILY 180 tablet 1 04/23/2016 at 0400  . furosemide (LASIX) 40 MG tablet Take 20 mg by mouth daily.   04/23/2016 at Unknown time  . levETIRAcetam (KEPPRA) 500 MG tablet Take 250 mg by mouth 2 (two) times daily.   04/23/2016 at Unknown time  . levothyroxine (SYNTHROID, LEVOTHROID) 50 MCG tablet Take 1 tablet daily before breakfast. Must beseen before next refill 30 tablet 0 04/23/2016 at Unknown time  . lisinopril (PRINIVIL,ZESTRIL) 10 MG tablet Take 10 mg by mouth daily.   04/23/2016 at Unknown time  . nitrofurantoin, macrocrystal-monohydrate, (MACROBID) 100 MG capsule Take 1 capsule (100 mg total) by mouth 2 (two) times daily. 10 capsule 0 04/22/2016 at Unknown time  . oxybutynin (DITROPAN) 5 MG tablet Take 1 tablet (5 mg total) by mouth 3 (three) times daily. 90 tablet 0 04/23/2016 at Unknown time  . potassium  chloride SA (K-DUR,KLOR-CON) 20 MEQ tablet Take 20 mEq by mouth daily with breakfast.    04/23/2016 at Unknown time  . traMADol (ULTRAM) 50 MG tablet Take 0.5-1 tablets (25-50 mg total) by mouth every 8 (eight) hours as needed. 30 tablet 0 04/23/2016 at Unknown time  . vitamin B-12 (CYANOCOBALAMIN) 500 MCG tablet Take 500 mcg by mouth every Monday, Wednesday, and Friday.    04/23/2016 at Unknown time  . warfarin (COUMADIN) 5 MG tablet Take 1/2 tablet on mondays and fridays. Take 1 tablet all other days. 85 tablet 1 04/23/2016 at 0400  . zolpidem (AMBIEN) 5 MG tablet TAKE 1/2 TO 1 TABLET AT BEDTIME AS NEEDED FOR INSOMNIA 30 tablet 2 04/22/2016 at Unknown time    Assessment: INR  therapeutic on admission.  Per PTA med list, pt already took dose today.  Resume Coumadin for afib.    Goal of Therapy:  INR 2-3 Monitor platelets by anticoagulation protocol: Yes   Plan:  No additional coumadin today INR daily  Hart Robinsons A 04/23/2016,3:49 PM

## 2016-04-23 NOTE — H&P (Signed)
History and Physical    KASSI ESTEVE ENI:[id number] DOB: 1932-05-25 DOA: 04/23/2016  PCP: Kenn File, MD  Patient coming from: Home   Chief Complaint: Chest pain since yesterday  HPI: Terri FUERTES is a 83 y.o. female with medical history significant of HTN, paroxysmal atrial fibrillation, hypothyroidism who presents for chest pain that started yesterday.  Patient states she believes the pain started yesterday morning and she states she was not doing anything strenuous at that time.  Patient put heating pads on her chest and states this helped.  Pain was mostly constant throughout the day.  She states she believes the pain was going to improve so she did not come in for evaluation.  She says she got up this am and pain was much more significant.  She also had pain in her sinus area and in the back of her head.  She just saw her primary care physician on 4/16 and again 4/19 for possible urinary tract infection.  She is taking cranberry, oxybutynin and macrobid for this. She voices that she is still having burning with urination even with the medications.  Reports more coughing at night when she is laying down.  Denies waking up with a sour or metallic taste in her mouth.  States she has seen blood in her urine but the last time was about 2 weeks ago.  Denies fevers or chills, denies shortness of breath.  Says she occasionally gets bilateral leg swelling.  Patient is chest pain free at time of admission.  ED Course: Seen by EDP- 2/2 risk factors cardiology consulted.  Cardiology recommends admission and cycling of troponins.  Patient found to be hyponatremic with sodium of 121.  Cr was WNL.  EKG showing new LBBB.  Echocardiogram ordered by cardiology.  Review of Systems: As per HPI otherwise 10 point review of systems negative.    Past Medical History:  Diagnosis Date  . Anemia   . Atrial fibrillation (HCC)    Paroxysmal, Flecainide therapy  . Calf pain    September, 2012, at rest  .  Cardiomegaly    EF normal, echo, 2003  //   EF 60-65%, echo, June 22, 6142, diastolic dysfunction,  . Carotid bruit    Doppler, December, 2009, no abnormality  . Cataract   . Diverticulosis   . GERD (gastroesophageal reflux disease)   . History of shingles   . HLD (hyperlipidemia)   . HTN (hypertension)   . Hypothyroidism   . Hypothyroidism   . Insomnia   . Normal nuclear stress test    Normal, 2001  . Osteoarthritis   . PONV (postoperative nausea and vomiting)   . Rectal bleeding 2001   diverticulosis and int hemorrhoids on 07/1999 and 02/2010 colonoscopies.  . Stroke ([place])   . Warfarin anticoagulation     Past Surgical History:  Procedure Laterality Date  . BIOPSY  09/29/2015   Procedure: BIOPSY;  Surgeon: Rogene Houston, MD;  Location: [facility];  Service: Endoscopy;;  gastric  . CATARACT EXTRACTION W/PHACO Right 04/15/2014   Procedure: CATARACT EXTRACTION PHACO AND INTRAOCULAR LENS PLACEMENT (IOC);  Surgeon: Tonny Branch, MD;  Location: [facility];  Service: Ophthalmology;  Laterality: Right;  CDE: 13.30  . CATARACT EXTRACTION W/PHACO Left 05/13/2014   Procedure: CATARACT EXTRACTION PHACO AND INTRAOCULAR LENS PLACEMENT LEFT EYE;  Surgeon: Tonny Branch, MD;  Location: [facility];  Service: Ophthalmology;  Laterality: Left;  CDE:13.00  . CHOLECYSTECTOMY N/A 01/15/2015   Procedure: LAPAROSCOPIC CHOLECYSTECTOMY;  Surgeon:  Ralene Ok, MD;  Location: [facility];  Service: General;  Laterality: N/A;  . COLONOSCOPY N/A 09/29/2015   Procedure: COLONOSCOPY;  Surgeon: Rogene Houston, MD;  Location: [facility];  Service: Endoscopy;  Laterality: N/A;  . COLONOSCOPY    . ESOPHAGOGASTRODUODENOSCOPY N/A 01/13/2015   Procedure: ESOPHAGOGASTRODUODENOSCOPY (EGD);  Surgeon: Jerene Bears, MD;  Location: [facility];  Service: Endoscopy;  Laterality: N/A;  . ESOPHAGOGASTRODUODENOSCOPY N/A 09/29/2015   Procedure: ESOPHAGOGASTRODUODENOSCOPY (EGD);  Surgeon: Rogene Houston, MD;  Location: [facility];   Service: Endoscopy;  Laterality: N/A;  12:15  . GALLBLADDER SURGERY    . SHOULDER OPEN ROTATOR CUFF REPAIR     bilateral  . TOTAL KNEE ARTHROPLASTY  05/26/10   right  . TOTAL KNEE ARTHROPLASTY  11/17/2010   Procedure: TOTAL KNEE ARTHROPLASTY;  Surgeon: Mauri Pole;  Location: [facility];  Service: Orthopedics;  Laterality: Left;  . TOTAL KNEE ARTHROPLASTY     Right  . TOTAL SHOULDER REPLACEMENT  01/2010   left     reports that she has never smoked. She has never used smokeless tobacco. She reports that she does not drink alcohol or use drugs.  Allergies  Allergen Reactions  . Penicillins Rash and Other (See Comments)    Has patient had a PCN reaction causing immediate rash, facial/tongue/throat swelling, SOB or lightheadedness with hypotension: No Has patient had a PCN reaction causing severe rash involving mucus membranes or skin necrosis: No Has patient had a PCN reaction that required hospitalization No Has patient had a PCN reaction occurring within the last 10 years: No If all of the above answers are "NO", then may proceed with Cephalosporin use.    Family History  Problem Relation Age of Onset  . COPD Brother   . Atrial fibrillation Brother   . Diabetes Brother   . Liver cancer Brother     Alcohol-related  . Colon cancer Brother   . Heart attack Father   . Rheum arthritis Mother   . Arthritis Mother   . Heart disease Mother   . Coronary artery disease Brother   . Heart attack Daughter   . Gallbladder disease Brother      Prior to Admission medications   Medication Sig Start Date End Date Taking? Authorizing Provider  atenolol (TENORMIN) 25 MG tablet Take 25 mg by mouth daily.    Yes Historical Provider, MD  atorvastatin (LIPITOR) 20 MG tablet Take 0.5 tablets (10 mg total) by mouth at bedtime. 11/13/15  Yes Cherre Robins, PharmD  flecainide (TAMBOCOR) 100 MG tablet TAKE 1 TABLET BY MOUTH TWO  TIMES DAILY 02/23/16  Yes Timmothy Euler, MD  furosemide (LASIX) 40 MG  tablet Take 20 mg by mouth daily.   Yes Historical Provider, MD  levETIRAcetam (KEPPRA) 500 MG tablet Take 250 mg by mouth 2 (two) times daily.   Yes Historical Provider, MD  levothyroxine (SYNTHROID, LEVOTHROID) 50 MCG tablet Take 1 tablet daily before breakfast. Must beseen before next refill 04/13/16  Yes Timmothy Euler, MD  lisinopril (PRINIVIL,ZESTRIL) 10 MG tablet Take 10 mg by mouth daily.   Yes Historical Provider, MD  nitrofurantoin, macrocrystal-monohydrate, (MACROBID) 100 MG capsule Take 1 capsule (100 mg total) by mouth 2 (two) times daily. 04/22/16  Yes Sharion Balloon, FNP  oxybutynin (DITROPAN) 5 MG tablet Take 1 tablet (5 mg total) by mouth 3 (three) times daily. 04/12/16  Yes Timmothy Euler, MD  potassium chloride SA (K-DUR,KLOR-CON) 20 MEQ tablet Take 20  mEq by mouth daily with breakfast.    Yes Historical Provider, MD  traMADol (ULTRAM) 50 MG tablet Take 0.5-1 tablets (25-50 mg total) by mouth every 8 (eight) hours as needed. 01/19/16  Yes Timmothy Euler, MD  vitamin B-12 (CYANOCOBALAMIN) 500 MCG tablet Take 500 mcg by mouth every Monday, Wednesday, and Friday.    Yes Historical Provider, MD  warfarin (COUMADIN) 5 MG tablet Take 1/2 tablet on mondays and fridays. Take 1 tablet all other days. 01/15/16  Yes Timmothy Euler, MD  zolpidem (AMBIEN) 5 MG tablet TAKE 1/2 TO 1 TABLET AT BEDTIME AS NEEDED FOR INSOMNIA 01/19/16  Yes Timmothy Euler, MD    Physical Exam: Vitals:   04/23/16 1300 04/23/16 1330 04/23/16 1430 04/23/16 1500  BP: (!) 145/62 (!) 142/68 136/87 (!) 169/60  Pulse: 64  67 65  Resp: (!) 21 15 18 18   Temp:    98.7 F (37.1 C)  TempSrc:    Oral  SpO2: 97%  93% 97%  Weight:    57.7 kg (127 lb 3.3 oz)  Height:    5\' 3"  (1.6 m)      Constitutional: NAD, calm, comfortable Vitals:   04/23/16 1300 04/23/16 1330 04/23/16 1430 04/23/16 1500  BP: (!) 145/62 (!) 142/68 136/87 (!) 169/60  Pulse: 64  67 65  Resp: (!) 21 15 18 18   Temp:    98.7 F (37.1 C)    TempSrc:    Oral  SpO2: 97%  93% 97%  Weight:    57.7 kg (127 lb 3.3 oz)  Height:    5\' 3"  (1.6 m)   Eyes: PERRL, lids and conjunctivae normal ENMT: Mucous membranes are moist. Posterior pharynx clear of any exudate or lesions.Normal dentition.  Neck: normal, supple, no masses, no thyromegaly Respiratory: clear to auscultation bilaterally, no wheezing, no crackles. Normal respiratory effort. No accessory muscle use.  Cardiovascular: irregular rate and rhythm, no murmurs / rubs / gallops. No extremity edema. 2+ pedal pulses. No carotid bruits.  Abdomen: suprapubic tenderness, no masses palpated. No hepatosplenomegaly. Bowel sounds positive.  Musculoskeletal: no clubbing / cyanosis. No joint deformity upper and lower extremities. Good ROM, no contractures. Normal muscle tone.  Skin: no rashes, lesions, ulcers. No induration Neurologic: CN 2-12 grossly intact. Sensation intact, DTR normal. Strength 5/5 in all 4.  Psychiatric: Normal judgment and insight. Alert and oriented x 3. Normal mood.     Labs on Admission: I have personally reviewed following labs and imaging studies  CBC:  Recent Labs Lab 04/23/16 0626  WBC 6.3  NEUTROABS 4.7  HGB 13.4  HCT 38.4  MCV 93.4  PLT 030   Basic Metabolic Panel:  Recent Labs Lab 04/23/16 0626 04/23/16 1108  NA 121*  --   K 3.1*  --   CL 82*  --   CO2 28  --   GLUCOSE 121*  --   BUN 6  --   CREATININE 0.57  --   CALCIUM 8.4*  --   MG  --  1.6*   GFR: Estimated Creatinine Clearance: 44.8 mL/min (by C-G formula based on SCr of 0.57 mg/dL). Liver Function Tests: No results for input(s): AST, ALT, ALKPHOS, BILITOT, PROT, ALBUMIN in the last 168 hours. No results for input(s): LIPASE, AMYLASE in the last 168 hours. No results for input(s): AMMONIA in the last 168 hours. Coagulation Profile:  Recent Labs Lab 04/23/16 0626  INR 2.18   Cardiac Enzymes:  Recent Labs Lab 04/23/16 1108  TROPONINI <  0.03   BNP (last 3  results) No results for input(s): PROBNP in the last 8760 hours. HbA1C: No results for input(s): HGBA1C in the last 72 hours. CBG: No results for input(s): GLUCAP in the last 168 hours. Lipid Profile: No results for input(s): CHOL, HDL, LDLCALC, TRIG, CHOLHDL, LDLDIRECT in the last 72 hours. Thyroid Function Tests:  Recent Labs  04/23/16 0626  TSH 1.046   Anemia Panel: No results for input(s): VITAMINB12, FOLATE, FERRITIN, TIBC, IRON, RETICCTPCT in the last 72 hours. Urine analysis:    Component Value Date/Time   COLORURINE YELLOW 09/28/2015 2255   APPEARANCEUR Clear 04/22/2016 1043   LABSPEC 1.010 09/28/2015 2255   PHURINE 7.5 09/28/2015 2255   GLUCOSEU Negative 04/22/2016 1043   HGBUR TRACE (A) 09/28/2015 2255   BILIRUBINUR Negative 04/22/2016 1043   KETONESUR NEGATIVE 09/28/2015 2255   PROTEINUR Negative 04/22/2016 1043   PROTEINUR NEGATIVE 09/28/2015 2255   UROBILINOGEN 0.2 11/09/2010 1235   NITRITE Negative 04/22/2016 1043   NITRITE NEGATIVE 09/28/2015 2255   LEUKOCYTESUR Negative 04/22/2016 1043   Sepsis Labs: !!!!!!!!!!!!!!!!!!!!!!!!!!!!!!!!!!!!!!!!!!!! @LABRCNTIP (procalcitonin:4,lacticidven:4) ) Recent Results (from the past 240 hour(s))  Microscopic Examination     Status: None   Collection Time: 04/19/16  8:31 AM  Result Value Ref Range Status   WBC, UA None seen 0 - 5 /hpf Final   RBC, UA 0-2 0 - 2 /hpf Final   Epithelial Cells (non renal) 0-10 0 - 10 /hpf Final   Renal Epithel, UA None seen None seen /hpf Final   Bacteria, UA None seen None seen/Few Final  Microscopic Examination     Status: Abnormal   Collection Time: 04/22/16 10:43 AM  Result Value Ref Range Status   WBC, UA 0-5 0 - 5 /hpf Final   RBC, UA 0-2 0 - 2 /hpf Final   Epithelial Cells (non renal) 0-10 0 - 10 /hpf Final   Renal Epithel, UA 0-10 (A) None seen /hpf Final   Bacteria, UA None seen None seen/Few Final     Radiological Exams on Admission: Dg Chest 2 View  Result Date:  04/23/2016 CLINICAL DATA:  Tightness in the chest EXAM: CHEST  2 VIEW COMPARISON:  08/23/2015 FINDINGS: Cardiac shadow is within normal limits. The lungs are well aerated bilaterally. Mild bibasilar atelectatic changes are seen. Postsurgical changes in left shoulder are noted. IMPRESSION: Mild bibasilar atelectasis. Electronically Signed   By: Inez Catalina M.D.   On: 04/23/2016 07:15    EKG: Independently reviewed. When not in atrial fib no LBBB  Assessment/Plan Principal Problem:   Chest pain Active Problems:   Warfarin anticoagulation   Hypothyroidism   Paroxysmal atrial fibrillation (HCC)   HTN (hypertension)   Hyponatremia   Hypomagnesemia     Chest pain - cardiology consulted - cycle troponins - serial EKG's - resolved at time of admission - telemetry monitoring  Atrial Fibrillation - rate control - flecainade held by cardiology - telemetry monitoring - warfarin per pharamcy  Hyponatremia - NS at 36ml/hr - urine osmol, urine sodium, serum osmol - renal ultrasound - monitor urine output  Hypothyroidism - continue home levothyroxine  Hypomagnesemia - replete magnesium with IV Mg  Suprapubic pain - UA ordered - urine culture from office visit  - as patient has not had relief from treatment with antibiotics and UA negative will not continue  HTN - continue home medications - hydralazine given x 1 per cardiology   DVT prophylaxis: Coumadin Code Status:  DNR  Family Communication:  No  family bedside  Disposition Plan:  Likely discharge back to home environment when improved Consults called: Cardiology called by EDP  Admission status: Inpatient, telemetry    Loretha Stapler MD [facility] Pager [phone]  If 7PM-7AM, please contact night-coverage [email or web address] Password [email or web address]  04/23/2016, 4:04 PM

## 2016-04-23 NOTE — ED Notes (Signed)
Dr Branch at bedside. ?

## 2016-04-23 NOTE — ED Notes (Signed)
Pt states it feels like the pain is improved

## 2016-04-24 ENCOUNTER — Inpatient Hospital Stay (HOSPITAL_COMMUNITY): Payer: Medicare Other

## 2016-04-24 DIAGNOSIS — R072 Precordial pain: Secondary | ICD-10-CM | POA: Diagnosis not present

## 2016-04-24 LAB — BASIC METABOLIC PANEL
ANION GAP: 8 (ref 5–15)
ANION GAP: 7 (ref 5–15)
Anion gap: 12 (ref 5–15)
BUN: 7 mg/dL (ref 6–20)
BUN: 7 mg/dL (ref 6–20)
BUN: 9 mg/dL (ref 6–20)
CALCIUM: 7.8 mg/dL — AB (ref 8.9–10.3)
CHLORIDE: 85 mmol/L — AB (ref 101–111)
CO2: 27 mmol/L (ref 22–32)
CO2: 25 mmol/L (ref 22–32)
CO2: 28 mmol/L (ref 22–32)
Calcium: 8.3 mg/dL — ABNORMAL LOW (ref 8.9–10.3)
Calcium: 8.1 mg/dL — ABNORMAL LOW (ref 8.9–10.3)
Chloride: 84 mmol/L — ABNORMAL LOW (ref 101–111)
Chloride: 85 mmol/L — ABNORMAL LOW (ref 101–111)
Creatinine, Ser: 0.5 mg/dL (ref 0.44–1.00)
Creatinine, Ser: 0.51 mg/dL (ref 0.44–1.00)
Creatinine, Ser: 0.58 mg/dL (ref 0.44–1.00)
GFR calc Af Amer: >60 mL/min (ref >60)
GFR calc Af Amer: >60 mL/min (ref >60)
GFR calc Af Amer: >60 mL/min (ref >60)
GFR calc non Af Amer: >60 mL/min (ref >60)
GFR calc non Af Amer: >60 mL/min (ref >60)
GFR calc non Af Amer: >60 mL/min (ref >60)
GLUCOSE: 123 mg/dL — AB (ref 65–99)
GLUCOSE: 126 mg/dL — AB (ref 65–99)
GLUCOSE: 89 mg/dL (ref 65–99)
POTASSIUM: 3.8 mmol/L (ref 3.5–5.1)
POTASSIUM: 4.4 mmol/L (ref 3.5–5.1)
Potassium: 3.9 mmol/L (ref 3.5–5.1)
SODIUM: 122 mmol/L — AB (ref 135–145)
Sodium: 119 mmol/L — CL (ref 135–145)
Sodium: 120 mmol/L — ABNORMAL LOW (ref 135–145)

## 2016-04-24 LAB — TSH: TSH: 0.578 u[IU]/mL (ref 0.350–4.500)

## 2016-04-24 LAB — PROTIME-INR
INR: 2.7
Prothrombin Time: 29.2 seconds — ABNORMAL HIGH (ref 11.4–15.2)

## 2016-04-24 LAB — OSMOLALITY, URINE
OSMOLALITY UR: 219 mosm/kg — AB (ref 300–900)
Osmolality, Ur: 355 mOsm/kg (ref 300–900)

## 2016-04-24 LAB — ECHOCARDIOGRAM COMPLETE
Height: 63 in
WEIGHTICAEL: 2035.29 [oz_av]

## 2016-04-24 LAB — TROPONIN I

## 2016-04-24 LAB — MAGNESIUM: MAGNESIUM: 2.5 mg/dL — AB (ref 1.7–2.4)

## 2016-04-24 MED ORDER — ENSURE ENLIVE PO LIQD
237.0000 mL | ORAL | Status: DC
  Administered 2016-04-24 – 2016-04-26 (×2): 237 mL via ORAL

## 2016-04-24 MED ORDER — WARFARIN SODIUM 2.5 MG PO TABS
2.5000 mg | ORAL_TABLET | Freq: Once | ORAL | Status: AC
  Administered 2016-04-24: 2.5 mg via ORAL
  Filled 2016-04-24: qty 1

## 2016-04-24 MED ORDER — DEXTROSE 5 % IV SOLN
1.0000 g | INTRAVENOUS | Status: DC
  Administered 2016-04-24 – 2016-04-26 (×3): 1 g via INTRAVENOUS
  Filled 2016-04-24 (×4): qty 10

## 2016-04-24 NOTE — Progress Notes (Signed)
Patients son, [name] arrived this afternoon to visit and asked to speak with MD for an update. Notified Dr. Adair Patter. Donavan Foil, RN

## 2016-04-24 NOTE — Care Management CC44 (Signed)
Condition Code 44 Documentation Completed  Patient Details  Name: [name] MRN: [id number] Date of Birth: [birth year]   Condition Code 44 given:  Yes Patient signature on Condition Code 44 notice:  Yes (telephone explanation to son, Amarissa Koerner and copy left in pt's room) Documentation of 2 MD's agreement:  Yes Code 44 added to claim:  Yes    Dellie Catholic, RN 04/24/2016, 4:35 PM

## 2016-04-24 NOTE — Progress Notes (Signed)
Initial Nutrition Assessment  DOCUMENTATION CODES:   Not applicable  INTERVENTION:  Decrease Ensure Enlive to q24 hrs, each supplement provides 350 kcal and 20 grams of protein  Monitor PO intake and follow up as clinically warranted.   NUTRITION DIAGNOSIS:  Inadequate oral intake related to poor appetite, acute illness (New LBBB, UTI) as evidenced by loss of 4 lbs in < 1 week  GOAL:  Patient will meet greater than or equal to 90% of their needs  MONITOR:  PO intake, Supplement acceptance, Diet advancement, Labs, Weight trends  REASON FOR ASSESSMENT:  Malnutrition Screening Tool    ASSESSMENT:  83 y/o female PMHx Afib, CVA, HTN, HLD, GERD. Presents with chest pain x1 day. EKG done and demonstrated new LBBB. Admitted for evaluation/management.   Pt had reported on MST that she had lost weight without trying and has had a poor appetite.   RD operating remotely, per chart, patient did not report any poor po intake or weight loss in admission workup. Her main symptoms appear to be chest pain. Also of note, has been recently suffering from a UTI and prescribed antibiotics which may have also impacted her appetite.   She appears to have lost 4 lbs in just the last week, however, her weights from 4/16 & 4/19 were taken outpatient where patients typically are weighed with shoes and clothes on.   At this time, there is no documented PO intake. Will order Ensure, but limit to 1x /day since pt is on a 1.5 L fluid restriction. Suspect her wt loss and reported po itnake related to her chest pain and UTI. Intake should improve as these are treated. Monitor PO intake and follow up as needed.   Physical Exam: Unable to conduct   Labs: Severe hyponatremia-fluid restricted now, bg 120-130,  Medications: Lasix, Ensure Enlive, KCL, warfarin   Recent Labs Lab 04/23/16 1108  04/23/16 2236 04/24/16 0337 04/24/16 0629  NA  --   < > 118* 119* 122*  K  --   < > 3.7 3.9 3.8  CL  --   < > 81*  84* 85*  CO2  --   < > 26 27 25   BUN  --   < > 7 7 7   CREATININE  --   < > 0.56 0.50 0.51  CALCIUM  --   < > 8.2* 7.8* 8.3*  MG 1.6*  --   --   --   --   GLUCOSE  --   < > 129* 123* 126*  < > = values in this interval not displayed.  Diet Order:  Diet Heart Room service appropriate? Yes; Fluid consistency: Thin; Fluid restriction: 1500 mL Fluid  Skin:  Reviewed, no issues  Last BM:  4/18  Height:  Ht Readings from Last 1 Encounters:  04/23/16 5\' 3"  (1.6 m)   Weight:  Wt Readings from Last 1 Encounters:  04/23/16 127 lb 3.3 oz (57.7 kg)   Wt Readings from Last 10 Encounters:  04/23/16 127 lb 3.3 oz (57.7 kg)  04/22/16 132 lb (59.9 kg)  04/19/16 131 lb (59.4 kg)  03/16/16 129 lb (58.5 kg)  03/08/16 130 lb 2 oz (59 kg)  01/19/16 135 lb (61.2 kg)  01/07/16 137 lb 3.2 oz (62.2 kg)  12/10/15 132 lb 9.6 oz (60.1 kg)  12/05/15 132 lb (59.9 kg)  11/04/15 135 lb 6.4 oz (61.4 kg)   Ideal Body Weight:  52.27 kg  BMI:  Body mass index is 22.53 kg/m.  Estimated Nutritional Needs:  Kcal:  1550-1750 kcals (27-30 kcal/kg bw) Protein:  70-80g Pro (1.2-1.4 g/kg bw) Fluid:  <1.5 Liters  EDUCATION NEEDS:  No education needs identified at this time  Burtis Junes RD, LDN, [facility] Pager: [phone] 04/24/2016 8:03 AM

## 2016-04-24 NOTE — Progress Notes (Signed)
*  PRELIMINARY RESULTS* Echocardiogram 2D Echocardiogram has been performed.  [name] 04/24/2016, 10:32 AM

## 2016-04-24 NOTE — Plan of Care (Signed)
Problem: Safety: Goal: Ability to remain free from injury will improve Outcome: Progressing Discussed fall prevention/safety plan with patient. Husband at bedside this am. Encouraged to call for assist and not attempt getting up on her own. nonslip socks in place, bedalarm on for safety. Call light and personal items kept within reach.

## 2016-04-24 NOTE — Progress Notes (Signed)
PROGRESS NOTE    [name]  UXL:[id number] DOB: [birth year] DOA: 04/23/2016 PCP: Kenn File, MD    Brief Narrative:   [name] is a 83 y.o. female with medical history significant of HTN, paroxysmal atrial fibrillation, hypothyroidism who presents for chest pain that started yesterday.  Patient states she believes the pain started yesterday morning and she states she was not doing anything strenuous at that time.  Patient put heating pads on her chest and states this helped.  Pain was mostly constant throughout the day.  She states she believes the pain was going to improve so she did not come in for evaluation.  She says she got up this am and pain was much more significant.  She also had pain in her sinus area and in the back of her head.  She just saw her primary care physician on 4/16 and again 4/19 for possible urinary tract infection.  She is taking cranberry, oxybutynin and macrobid for this. She voices that she is still having burning with urination even with the medications.  Reports more coughing at night when she is laying down.  Denies waking up with a sour or metallic taste in her mouth.  States she has seen blood in her urine but the last time was about 2 weeks ago.  Denies fevers or chills, denies shortness of breath.  Says she occasionally gets bilateral leg swelling.  Patient is chest pain free at time of admission.   Assessment & Plan:   Principal Problem:   Chest pain Active Problems:   Warfarin anticoagulation   Hypothyroidism   Paroxysmal atrial fibrillation (HCC)   HTN (hypertension)   Hyponatremia   Hypomagnesemia   Chest pain - cardiology consulted - serial troponins negative - resolved at time of admission - telemetry monitoring showing occasional PVC's  Urinary Tract Infection - start Ceftriaxone - await culture and sensitivity data  Atrial Fibrillation - rate control - flecainade held by cardiology - telemetry monitoring - warfarin per  pharamcy  Hyponatremia possibly due to SIADH? - renal ultrasound - monitor urine output - fluid restrict to 1547ml/day - will consult nephrology  Hypothyroidism - continue home levothyroxine - TSH wnl  Hypomagnesemia - normal today  HTN - continue home medications - hydralazine given x 1 per cardiology   DVT prophylaxis: Coumadin Code Status:  DNR  Family Communication:  Husband at bedside Disposition Plan:  Likely discharge back to home environment when improved   Consultants:   Cardiology called by EDP  Nephrology  Procedures:   None  Antimicrobials:   Ceftriaxone   Subjective: Patient and husband have many questions- mostly related to her frequent urination that she has been experiencing for months.  Patient reports an episode of emesis this am.  Patient states that she occasionally has nausea and vomiting at home.  Did not sleep well last night and blames this on that her husband did not stay in the hospital with her.  Objective: Vitals:   04/23/16 2014 04/23/16 2100 04/23/16 2352 04/24/16 0424  BP: (!) 166/66  (!) 148/53 (!) 148/58  Pulse:   66 65  Resp:      Temp:   98.5 F (36.9 C) 98.8 F (37.1 C)  TempSrc:   Oral Oral  SpO2:  98% 95% 95%  Weight:      Height:        Intake/Output Summary (Last 24 hours) at 04/24/16 1620 Last data filed at 04/24/16 1247  Gross per 24 hour  Intake              385 ml  Output              500 ml  Net             -115 ml   Filed Weights   04/23/16 1500  Weight: 57.7 kg (127 lb 3.3 oz)    Examination:  General exam: Appears calm and comfortable  Respiratory system: Clear to auscultation. Respiratory effort normal. Cardiovascular system: S1 & S2 heard, RRR. No JVD, murmurs, rubs, gallops or clicks. No pedal edema. Gastrointestinal system: Abdomen is nondistended, soft and tender in suprapubic area.. No organomegaly or masses felt. Normal bowel sounds heard. Central nervous system: Alert and  oriented. No focal neurological deficits except that patient is hard of hearing Extremities: Symmetric 5 x 5 power. Skin: No rashes, lesions or ulcers Psychiatry: limited insight into medical problems. Mood & affect appropriate.     Data Reviewed: I have personally reviewed following labs and imaging studies  CBC:  Recent Labs Lab 04/23/16 0626  WBC 6.3  NEUTROABS 4.7  HGB 13.4  HCT 38.4  MCV 93.4  PLT 233   Basic Metabolic Panel:  Recent Labs Lab 04/23/16 1108 04/23/16 1629 04/23/16 2236 04/24/16 0337 04/24/16 0629 04/24/16 1148  NA  --  120* 118* 119* 122* 120*  K  --  3.5 3.7 3.9 3.8 4.4  CL  --  83* 81* 84* 85* 85*  CO2  --  26 26 27 25 28   GLUCOSE  --  119* 129* 123* 126* 89  BUN  --  6 7 7 7 9   CREATININE  --  0.47 0.56 0.50 0.51 0.58  CALCIUM  --  8.3* 8.2* 7.8* 8.3* 8.1*  MG 1.6*  --   --   --  2.5*  --    GFR: Estimated Creatinine Clearance: 44.8 mL/min (by C-G formula based on SCr of 0.58 mg/dL). Liver Function Tests: No results for input(s): AST, ALT, ALKPHOS, BILITOT, PROT, ALBUMIN in the last 168 hours. No results for input(s): LIPASE, AMYLASE in the last 168 hours. No results for input(s): AMMONIA in the last 168 hours. Coagulation Profile:  Recent Labs Lab 04/23/16 0626 04/24/16 0342  INR 2.18 2.70   Cardiac Enzymes:  Recent Labs Lab 04/23/16 1108 04/23/16 1629 04/23/16 2236 04/24/16 0337  TROPONINI <0.03 <0.03 <0.03 <0.03   BNP (last 3 results) No results for input(s): PROBNP in the last 8760 hours. HbA1C: No results for input(s): HGBA1C in the last 72 hours. CBG: No results for input(s): GLUCAP in the last 168 hours. Lipid Profile: No results for input(s): CHOL, HDL, LDLCALC, TRIG, CHOLHDL, LDLDIRECT in the last 72 hours. Thyroid Function Tests:  Recent Labs  04/24/16 1145  TSH 0.578   Anemia Panel: No results for input(s): VITAMINB12, FOLATE, FERRITIN, TIBC, IRON, RETICCTPCT in the last 72 hours. Sepsis Labs: No  results for input(s): PROCALCITON, LATICACIDVEN in the last 168 hours.  Recent Results (from the past 240 hour(s))  Microscopic Examination     Status: None   Collection Time: 04/19/16  8:31 AM  Result Value Ref Range Status   WBC, UA None seen 0 - 5 /hpf Final   RBC, UA 0-2 0 - 2 /hpf Final   Epithelial Cells (non renal) 0-10 0 - 10 /hpf Final   Renal Epithel, UA None seen None seen /hpf Final   Bacteria, UA None seen None seen/Few Final  Urine culture  Status: Abnormal (Preliminary result)   Collection Time: 04/22/16 10:43 AM  Result Value Ref Range Status   Urine Culture, Routine Preliminary report (A)  Preliminary   Urine Culture result 1 Gram negative rods (A)  Preliminary    Comment: 50,000-100,000 colony forming units per mL  Microscopic Examination     Status: Abnormal   Collection Time: 04/22/16 10:43 AM  Result Value Ref Range Status   WBC, UA 0-5 0 - 5 /hpf Final   RBC, UA 0-2 0 - 2 /hpf Final   Epithelial Cells (non renal) 0-10 0 - 10 /hpf Final   Renal Epithel, UA 0-10 (A) None seen /hpf Final   Bacteria, UA None seen None seen/Few Final         Radiology Studies: Dg Chest 2 View  Result Date: 04/23/2016 CLINICAL DATA:  Tightness in the chest EXAM: CHEST  2 VIEW COMPARISON:  08/23/2015 FINDINGS: Cardiac shadow is within normal limits. The lungs are well aerated bilaterally. Mild bibasilar atelectatic changes are seen. Postsurgical changes in left shoulder are noted. IMPRESSION: Mild bibasilar atelectasis. Electronically Signed   By: Inez Catalina M.D.   On: 04/23/2016 07:15   US Renal  Result Date: 04/23/2016 CLINICAL DATA:  Hyponatremia. EXAM: RENAL / URINARY TRACT ULTRASOUND COMPLETE COMPARISON:  CT of 09/28/2015. FINDINGS: Right Kidney: Length: 10.6 cm. 2.9 x 3.2 x 2.9 cm upper pole right renal cyst. Normal cortical thickness and echogenicity. No hydronephrosis. Left Kidney: Length: 9.9 cm. Echogenicity within normal limits. No mass or hydronephrosis  visualized. Bladder: Appears normal for degree of bladder distention. IMPRESSION: No hydronephrosis or other acute process. Upper pole right renal cyst. Electronically Signed   By: Abigail Miyamoto M.D.   On: 04/23/2016 16:31        Scheduled Meds: . atenolol  25 mg Oral Daily  . atorvastatin  10 mg Oral QHS  . feeding supplement (ENSURE ENLIVE)  237 mL Oral Q24H  . furosemide  20 mg Oral Daily  . levETIRAcetam  250 mg Oral BID  . levothyroxine  50 mcg Oral QAC breakfast  . lisinopril  10 mg Oral Daily  . potassium chloride  40 mEq Oral BID  . warfarin  2.5 mg Oral Once  . Warfarin - Pharmacist Dosing Inpatient   Does not apply Q24H   Continuous Infusions: . cefTRIAXone (ROCEPHIN)  IV Stopped (04/24/16 1229)     LOS: 1 day    Time spent: 30 minutes    Loretha Stapler, MD [facility] Pager [phone]  If 7PM-7AM, please contact night-coverage [email or web address] Password [email or web address] 04/24/2016, 4:20 PM

## 2016-04-24 NOTE — Progress Notes (Signed)
Critical Sodium 119 and MD notified. No new orders. Will continue to monitor patient.

## 2016-04-24 NOTE — Progress Notes (Signed)
Pharmacy Antibiotic Note  [name] is a 83 y.o. female admitted on 04/23/2016 with UTI.  Pharmacy has been consulted for ROCEPHIN dosing.  Plan: Rocephin 1gm IV q24hrs Monitor labs, progress, c/s  Height: 5\' 3"  (160 cm) Weight: 127 lb 3.3 oz (57.7 kg) IBW/kg (Calculated) : 52.4  Temp (24hrs), Avg:98.4 F (36.9 C), Min:97.5 F (36.4 C), Max:98.8 F (37.1 C)   Recent Labs Lab 04/23/16 0626 04/23/16 1629 04/23/16 2236 04/24/16 0337 04/24/16 0629  WBC 6.3  --   --   --   --   CREATININE 0.57 0.47 0.56 0.50 0.51    Estimated Creatinine Clearance: 44.8 mL/min (by C-G formula based on SCr of 0.51 mg/dL).    Allergies  Allergen Reactions  . Penicillins Rash and Other (See Comments)    Has patient had a PCN reaction causing immediate rash, facial/tongue/throat swelling, SOB or lightheadedness with hypotension: No Has patient had a PCN reaction causing severe rash involving mucus membranes or skin necrosis: No Has patient had a PCN reaction that required hospitalization No Has patient had a PCN reaction occurring within the last 10 years: No If all of the above answers are "NO", then may proceed with Cephalosporin use.   Antimicrobials this admission: Rocephin 4/21 >>   Dose adjustments this admission:  Microbiology results:  BCx: pending  UCx: pending   Sputum:    MRSA PCR:   Thank you for allowing pharmacy to be a part of this patient's care.  Hart Robinsons A 04/24/2016 9:42 AM

## 2016-04-24 NOTE — Progress Notes (Signed)
ANTICOAGULATION CONSULT NOTE - follow up  Pharmacy Consult for COUMADIN Indication: atrial fibrillation  Allergies  Allergen Reactions  . Penicillins Rash and Other (See Comments)    Has patient had a PCN reaction causing immediate rash, facial/tongue/throat swelling, SOB or lightheadedness with hypotension: No Has patient had a PCN reaction causing severe rash involving mucus membranes or skin necrosis: No Has patient had a PCN reaction that required hospitalization No Has patient had a PCN reaction occurring within the last 10 years: No If all of the above answers are "NO", then may proceed with Cephalosporin use.   Patient Measurements: Height: 5\' 3"  (160 cm) Weight: 127 lb 3.3 oz (57.7 kg) IBW/kg (Calculated) : 52.4  Vital Signs: Temp: 98.8 F (37.1 C) (04/21 0424) Temp Source: Oral (04/21 0424) BP: 148/58 (04/21 0424) Pulse Rate: 65 (04/21 0424)  Labs:  Recent Labs  04/23/16 5643  04/23/16 1629 04/23/16 2236 04/24/16 0337 04/24/16 0342 04/24/16 0629  HGB 13.4  --   --   --   --   --   --   HCT 38.4  --   --   --   --   --   --   PLT 310  --   --   --   --   --   --   LABPROT 24.6*  --   --   --   --  29.2*  --   INR 2.18  --   --   --   --  2.70  --   CREATININE 0.57  --  0.47 0.56 0.50  --  0.51  TROPONINI  --   < > <0.03 <0.03 <0.03  --   --   < > = values in this interval not displayed.  Estimated Creatinine Clearance: 44.8 mL/min (by C-G formula based on SCr of 0.51 mg/dL).   Medical History: Past Medical History:  Diagnosis Date  . Anemia   . Atrial fibrillation (HCC)    Paroxysmal, Flecainide therapy  . Calf pain    September, 2012, at rest  . Cardiomegaly    EF normal, echo, 2003  //   EF 60-65%, echo, June 21, 3293, diastolic dysfunction,  . Carotid bruit    Doppler, December, 2009, no abnormality  . Cataract   . Diverticulosis   . GERD (gastroesophageal reflux disease)   . History of shingles   . HLD (hyperlipidemia)   . HTN  (hypertension)   . Hypothyroidism   . Hypothyroidism   . Insomnia   . Normal nuclear stress test    Normal, 2001  . Osteoarthritis   . PONV (postoperative nausea and vomiting)   . Rectal bleeding 2001   diverticulosis and int hemorrhoids on 07/1999 and 02/2010 colonoscopies.  . Stroke ([place])   . Warfarin anticoagulation     Medications:  Prescriptions Prior to Admission  Medication Sig Dispense Refill Last Dose  . atenolol (TENORMIN) 25 MG tablet Take 25 mg by mouth daily.    04/23/2016 at 0400  . atorvastatin (LIPITOR) 20 MG tablet Take 0.5 tablets (10 mg total) by mouth at bedtime. 45 tablet 0 04/22/2016 at Unknown time  . flecainide (TAMBOCOR) 100 MG tablet TAKE 1 TABLET BY MOUTH TWO  TIMES DAILY 180 tablet 1 04/23/2016 at 0400  . furosemide (LASIX) 40 MG tablet Take 20 mg by mouth daily.   04/23/2016 at Unknown time  . levETIRAcetam (KEPPRA) 500 MG tablet Take 250 mg by mouth 2 (two) times daily.  04/23/2016 at Unknown time  . levothyroxine (SYNTHROID, LEVOTHROID) 50 MCG tablet Take 1 tablet daily before breakfast. Must beseen before next refill 30 tablet 0 04/23/2016 at Unknown time  . lisinopril (PRINIVIL,ZESTRIL) 10 MG tablet Take 10 mg by mouth daily.   04/23/2016 at Unknown time  . nitrofurantoin, macrocrystal-monohydrate, (MACROBID) 100 MG capsule Take 1 capsule (100 mg total) by mouth 2 (two) times daily. 10 capsule 0 04/22/2016 at Unknown time  . oxybutynin (DITROPAN) 5 MG tablet Take 1 tablet (5 mg total) by mouth 3 (three) times daily. 90 tablet 0 04/23/2016 at Unknown time  . potassium chloride SA (K-DUR,KLOR-CON) 20 MEQ tablet Take 20 mEq by mouth daily with breakfast.    04/23/2016 at Unknown time  . traMADol (ULTRAM) 50 MG tablet Take 0.5-1 tablets (25-50 mg total) by mouth every 8 (eight) hours as needed. 30 tablet 0 04/23/2016 at Unknown time  . vitamin B-12 (CYANOCOBALAMIN) 500 MCG tablet Take 500 mcg by mouth every Monday, Wednesday, and Friday.    04/23/2016 at Unknown time  .  warfarin (COUMADIN) 5 MG tablet Take 1/2 tablet on mondays and fridays. Take 1 tablet all other days. 85 tablet 1 04/23/2016 at 0400  . zolpidem (AMBIEN) 5 MG tablet TAKE 1/2 TO 1 TABLET AT BEDTIME AS NEEDED FOR INSOMNIA 30 tablet 2 04/22/2016 at Unknown time   Assessment: INR therapeutic at upper end of range. Resume Coumadin for afib.  Home dose noted above.   Goal of Therapy:  INR 2-3 Monitor platelets by anticoagulation protocol: Yes   Plan:  Coumadin 2.5mg  po today x 1 INR daily  Taisia Fantini A 04/24/2016,8:38 AM

## 2016-04-24 NOTE — Progress Notes (Signed)
Patient reported "i'm having diarrhea" this am when assisted to bathroom by her husband. Stated she had loose stool last night and early am as well, unsure if she notified RN. Discussed importance of calling for nurse and not attempting to get up on her own for safety. She and husband verbalized understanding. Notified Dr. Adair Patter of patient reports of loose stools and asked if stool specimens needed. No orders received this am. Initiated enteric precautions. Donavan Foil, RN

## 2016-04-24 NOTE — Care Management Obs Status (Signed)
[place] NOTIFICATION   Patient Details  Name: [name] MRN: [id number] Date of Birth: 1932-12-23   Medicare Observation Status Notification Given:  Yes    Dellie Catholic, RN 04/24/2016, 4:35 PM

## 2016-04-25 ENCOUNTER — Observation Stay (HOSPITAL_COMMUNITY): Payer: Medicare Other

## 2016-04-25 DIAGNOSIS — R0789 Other chest pain: Secondary | ICD-10-CM | POA: Diagnosis present

## 2016-04-25 DIAGNOSIS — W19XXXA Unspecified fall, initial encounter: Secondary | ICD-10-CM | POA: Diagnosis not present

## 2016-04-25 DIAGNOSIS — S0990XA Unspecified injury of head, initial encounter: Secondary | ICD-10-CM | POA: Diagnosis not present

## 2016-04-25 DIAGNOSIS — Z9842 Cataract extraction status, left eye: Secondary | ICD-10-CM | POA: Diagnosis not present

## 2016-04-25 DIAGNOSIS — Z96612 Presence of left artificial shoulder joint: Secondary | ICD-10-CM | POA: Diagnosis present

## 2016-04-25 DIAGNOSIS — N390 Urinary tract infection, site not specified: Secondary | ICD-10-CM | POA: Diagnosis present

## 2016-04-25 DIAGNOSIS — Z9841 Cataract extraction status, right eye: Secondary | ICD-10-CM | POA: Diagnosis not present

## 2016-04-25 DIAGNOSIS — K219 Gastro-esophageal reflux disease without esophagitis: Secondary | ICD-10-CM | POA: Diagnosis present

## 2016-04-25 DIAGNOSIS — I4892 Unspecified atrial flutter: Secondary | ICD-10-CM | POA: Diagnosis present

## 2016-04-25 DIAGNOSIS — E039 Hypothyroidism, unspecified: Secondary | ICD-10-CM | POA: Diagnosis present

## 2016-04-25 DIAGNOSIS — B9620 Unspecified Escherichia coli [E. coli] as the cause of diseases classified elsewhere: Secondary | ICD-10-CM | POA: Diagnosis present

## 2016-04-25 DIAGNOSIS — Z66 Do not resuscitate: Secondary | ICD-10-CM | POA: Diagnosis present

## 2016-04-25 DIAGNOSIS — Z880 Allergy status to penicillin: Secondary | ICD-10-CM | POA: Diagnosis not present

## 2016-04-25 DIAGNOSIS — Z7901 Long term (current) use of anticoagulants: Secondary | ICD-10-CM | POA: Diagnosis not present

## 2016-04-25 DIAGNOSIS — I480 Paroxysmal atrial fibrillation: Secondary | ICD-10-CM | POA: Diagnosis not present

## 2016-04-25 DIAGNOSIS — E785 Hyperlipidemia, unspecified: Secondary | ICD-10-CM | POA: Diagnosis present

## 2016-04-25 DIAGNOSIS — G4700 Insomnia, unspecified: Secondary | ICD-10-CM | POA: Diagnosis present

## 2016-04-25 DIAGNOSIS — I10 Essential (primary) hypertension: Secondary | ICD-10-CM | POA: Diagnosis not present

## 2016-04-25 DIAGNOSIS — M1990 Unspecified osteoarthritis, unspecified site: Secondary | ICD-10-CM | POA: Diagnosis present

## 2016-04-25 DIAGNOSIS — Z961 Presence of intraocular lens: Secondary | ICD-10-CM | POA: Diagnosis present

## 2016-04-25 DIAGNOSIS — Z8673 Personal history of transient ischemic attack (TIA), and cerebral infarction without residual deficits: Secondary | ICD-10-CM | POA: Diagnosis not present

## 2016-04-25 DIAGNOSIS — E871 Hypo-osmolality and hyponatremia: Secondary | ICD-10-CM | POA: Diagnosis not present

## 2016-04-25 DIAGNOSIS — R079 Chest pain, unspecified: Secondary | ICD-10-CM | POA: Diagnosis not present

## 2016-04-25 DIAGNOSIS — Z96653 Presence of artificial knee joint, bilateral: Secondary | ICD-10-CM | POA: Diagnosis present

## 2016-04-25 DIAGNOSIS — Z79899 Other long term (current) drug therapy: Secondary | ICD-10-CM | POA: Diagnosis not present

## 2016-04-25 DIAGNOSIS — E876 Hypokalemia: Secondary | ICD-10-CM | POA: Diagnosis present

## 2016-04-25 LAB — PROTIME-INR
INR: 2.18
PROTHROMBIN TIME: 24.6 s — AB (ref 11.4–15.2)

## 2016-04-25 LAB — BASIC METABOLIC PANEL
Anion gap: 6 (ref 5–15)
BUN: 16 mg/dL (ref 6–20)
CALCIUM: 8 mg/dL — AB (ref 8.9–10.3)
CO2: 26 mmol/L (ref 22–32)
CREATININE: 0.6 mg/dL (ref 0.44–1.00)
Chloride: 91 mmol/L — ABNORMAL LOW (ref 101–111)
GFR calc non Af Amer: >60 mL/min (ref >60)
Glucose, Bld: 105 mg/dL — ABNORMAL HIGH (ref 65–99)
Potassium: 5.1 mmol/L (ref 3.5–5.1)
SODIUM: 123 mmol/L — AB (ref 135–145)

## 2016-04-25 LAB — URINE CULTURE

## 2016-04-25 MED ORDER — FUROSEMIDE 10 MG/ML IJ SOLN
20.0000 mg | Freq: Two times a day (BID) | INTRAMUSCULAR | Status: DC
  Administered 2016-04-25 – 2016-04-26 (×2): 20 mg via INTRAVENOUS
  Filled 2016-04-25 (×2): qty 2

## 2016-04-25 MED ORDER — WARFARIN SODIUM 5 MG PO TABS
5.0000 mg | ORAL_TABLET | Freq: Once | ORAL | Status: AC
  Administered 2016-04-25: 5 mg via ORAL
  Filled 2016-04-25: qty 1

## 2016-04-25 MED ORDER — SODIUM CHLORIDE 0.9 % IV SOLN
INTRAVENOUS | Status: DC
  Administered 2016-04-25: 23:00:00 via INTRAVENOUS

## 2016-04-25 NOTE — Progress Notes (Signed)
PROGRESS NOTE    [name]  QAS:[id number] DOB: 07-Oct-1932 DOA: 04/23/2016 PCP: Kenn File, MD    Brief Narrative:   [name] is a 83 y.o. female with medical history significant of HTN, paroxysmal atrial fibrillation, hypothyroidism who presents for chest pain that started yesterday.  Patient states she believes the pain started yesterday morning and she states she was not doing anything strenuous at that time.  Patient put heating pads on her chest and states this helped.  Pain was mostly constant throughout the day.  She states she believes the pain was going to improve so she did not come in for evaluation.  She says she got up this am and pain was much more significant.  She also had pain in her sinus area and in the back of her head.  She just saw her primary care physician on 4/16 and again 4/19 for possible urinary tract infection.  She is taking cranberry, oxybutynin and macrobid for this. She voices that she is still having burning with urination even with the medications.  Reports more coughing at night when she is laying down.  Denies waking up with a sour or metallic taste in her mouth.  States she has seen blood in her urine but the last time was about 2 weeks ago.  Denies fevers or chills, denies shortness of breath.  Says she occasionally gets bilateral leg swelling.  Patient is chest pain free at time of admission.   Assessment & Plan:   Principal Problem:   Chest pain Active Problems:   Warfarin anticoagulation   Hypothyroidism   Paroxysmal atrial fibrillation (HCC)   HTN (hypertension)   Hyponatremia   Hypomagnesemia   Hyponatremia  - renal ultrasound normal - free water restriction - 20mg  IV Lasix BID - increased salt intake encouraged - monitor urine output - fluid restrict to 1538ml/day - nephrology consulted  Fall - confusion at night could be attributed to sundowning, hyponatremia and UTI - since on blood thinners need CT head to r/o  SDH  Diarrhea - patient states she has a history of C- Diff - C- diff PCR sent - if negative can give immodium - patient states she has been having diarrhea everyday for months  Urinary Tract Infection - continue Ceftriaxone 2/2 susceptibility of E. Coli  Atrial Fibrillation - rate control - flecainade held by cardiology - warfarin per pharamcy  Chest pain - cardiology consulted by EDP - serial troponins negative - resolved at time of admission - telemetry monitoring showing occasional PVC's  Hypothyroidism - continue home levothyroxine - TSH wnl  HTN - continue home medications - hydralazine given x 1 per cardiology   DVT prophylaxis: Coumadin Code Status:  DNR  Family Communication:  Son is bedside Disposition Plan:  Likely discharge back to home environment when improved   Consultants:   Cardiology called by EDP  Nephrology  Procedures:   None  Antimicrobials:   Ceftriaxone   Subjective: Patient seen this am- her son is bedside.  Patient reports confusion overnight and laying on the floor but no notes in chart stating this.    Objective: Vitals:   04/24/16 1929 04/25/16 0212 04/25/16 0215 04/25/16 0600  BP: (!) 150/59 (!) 159/58 (!) 162/62 (!) 160/60  Pulse: 70 65 65 75  Resp: 18 20  18   Temp: 98.5 F (36.9 C) 98.9 F (37.2 C) 98.7 F (37.1 C) 98.8 F (37.1 C)  TempSrc: Oral Oral Oral Oral  SpO2: 98% 96% 96%  96%  Weight:      Height:        Intake/Output Summary (Last 24 hours) at 04/25/16 1142 Last data filed at 04/25/16 0900  Gross per 24 hour  Intake              410 ml  Output             1125 ml  Net             -715 ml   Filed Weights   04/23/16 1500  Weight: 57.7 kg (127 lb 3.3 oz)    Examination:  General exam: Appears calm and comfortable  Respiratory system: fine bibasilar rales Cardiovascular system: S1 & S2 heard, RRR. No JVD, murmurs, rubs, gallops or clicks. No pedal edema. Gastrointestinal system: Abdomen  is nondistended, soft and suprapubic tenderness area.. No organomegaly or masses felt. Normal bowel sounds heard. Central nervous system: Alert and oriented. No focal neurological deficits except that patient is hard of hearing Extremities: slightly weak in all 4 extremities Skin: No rashes, lesions or ulcers Psychiatry: limited insight into medical problems. Mood & affect appropriate.     Data Reviewed: I have personally reviewed following labs and imaging studies  CBC:  Recent Labs Lab 04/23/16 0626  WBC 6.3  NEUTROABS 4.7  HGB 13.4  HCT 38.4  MCV 93.4  PLT 921   Basic Metabolic Panel:  Recent Labs Lab 04/23/16 1108  04/23/16 2236 04/24/16 0337 04/24/16 0629 04/24/16 1148 04/25/16 0303  NA  --   < > 118* 119* 122* 120* 123*  K  --   < > 3.7 3.9 3.8 4.4 5.1  CL  --   < > 81* 84* 85* 85* 91*  CO2  --   < > 26 27 25 28 26   GLUCOSE  --   < > 129* 123* 126* 89 105*  BUN  --   < > 7 7 7 9 16   CREATININE  --   < > 0.56 0.50 0.51 0.58 0.60  CALCIUM  --   < > 8.2* 7.8* 8.3* 8.1* 8.0*  MG 1.6*  --   --   --  2.5*  --   --   < > = values in this interval not displayed. GFR: Estimated Creatinine Clearance: 44.8 mL/min (by C-G formula based on SCr of 0.6 mg/dL). Liver Function Tests: No results for input(s): AST, ALT, ALKPHOS, BILITOT, PROT, ALBUMIN in the last 168 hours. No results for input(s): LIPASE, AMYLASE in the last 168 hours. No results for input(s): AMMONIA in the last 168 hours. Coagulation Profile:  Recent Labs Lab 04/23/16 0626 04/24/16 0342 04/25/16 0303  INR 2.18 2.70 2.18   Cardiac Enzymes:  Recent Labs Lab 04/23/16 1108 04/23/16 1629 04/23/16 2236 04/24/16 0337  TROPONINI <0.03 <0.03 <0.03 <0.03   BNP (last 3 results) No results for input(s): PROBNP in the last 8760 hours. HbA1C: No results for input(s): HGBA1C in the last 72 hours. CBG: No results for input(s): GLUCAP in the last 168 hours. Lipid Profile: No results for input(s): CHOL,  HDL, LDLCALC, TRIG, CHOLHDL, LDLDIRECT in the last 72 hours. Thyroid Function Tests:  Recent Labs  04/24/16 1145  TSH 0.578   Anemia Panel: No results for input(s): VITAMINB12, FOLATE, FERRITIN, TIBC, IRON, RETICCTPCT in the last 72 hours. Sepsis Labs: No results for input(s): PROCALCITON, LATICACIDVEN in the last 168 hours.  Recent Results (from the past 240 hour(s))  Microscopic Examination     Status: None  Collection Time: 04/19/16  8:31 AM  Result Value Ref Range Status   WBC, UA None seen 0 - 5 /hpf Final   RBC, UA 0-2 0 - 2 /hpf Final   Epithelial Cells (non renal) 0-10 0 - 10 /hpf Final   Renal Epithel, UA None seen None seen /hpf Final   Bacteria, UA None seen None seen/Few Final  Urine culture     Status: Abnormal   Collection Time: 04/22/16 10:43 AM  Result Value Ref Range Status   Urine Culture, Routine Final report (A)  Final   Urine Culture result 1 Escherichia coli (A)  Final    Comment: 50,000-100,000 colony forming units per mL Cefazolin <=4 ug/mL Cefazolin with an MIC <=16 predicts susceptibility to the oral agents cefaclor, cefdinir, cefpodoxime, cefprozil, cefuroxime, cephalexin, and loracarbef when used for therapy of uncomplicated urinary tract infections due to E. coli, Klebsiella pneumoniae, and Proteus mirabilis.    RESULT 2 Comment  Final    Comment: Mixed urogenital flora 10,000-25,000 colony forming units per mL    ANTIMICROBIAL SUSCEPTIBILITY Comment  Final    Comment:       ** S = Susceptible; I = Intermediate; R = Resistant **                    P = Positive; N = Negative             MICS are expressed in micrograms per mL    Antibiotic                 RSLT#1    RSLT#2    RSLT#3    RSLT#4 Amoxicillin/Clavulanic Acid    S Ampicillin                     S Cefepime                       S Ceftriaxone                    S Cefuroxime                     S Cephalothin                    I Ciprofloxacin                  S Ertapenem                       S Gentamicin                     S Imipenem                       S Levofloxacin                   S Nitrofurantoin                 S Piperacillin                   S Tetracycline                   S Tobramycin                     S Trimethoprim/Sulfa  S   Microscopic Examination     Status: Abnormal   Collection Time: 04/22/16 10:43 AM  Result Value Ref Range Status   WBC, UA 0-5 0 - 5 /hpf Final   RBC, UA 0-2 0 - 2 /hpf Final   Epithelial Cells (non renal) 0-10 0 - 10 /hpf Final   Renal Epithel, UA 0-10 (A) None seen /hpf Final   Bacteria, UA None seen None seen/Few Final         Radiology Studies: US Renal  Result Date: 04/23/2016 CLINICAL DATA:  Hyponatremia. EXAM: RENAL / URINARY TRACT ULTRASOUND COMPLETE COMPARISON:  CT of 09/28/2015. FINDINGS: Right Kidney: Length: 10.6 cm. 2.9 x 3.2 x 2.9 cm upper pole right renal cyst. Normal cortical thickness and echogenicity. No hydronephrosis. Left Kidney: Length: 9.9 cm. Echogenicity within normal limits. No mass or hydronephrosis visualized. Bladder: Appears normal for degree of bladder distention. IMPRESSION: No hydronephrosis or other acute process. Upper pole right renal cyst. Electronically Signed   By: Abigail Miyamoto M.D.   On: 04/23/2016 16:31        Scheduled Meds: . atenolol  25 mg Oral Daily  . atorvastatin  10 mg Oral QHS  . feeding supplement (ENSURE ENLIVE)  237 mL Oral Q24H  . furosemide  20 mg Intravenous BID  . levETIRAcetam  250 mg Oral BID  . levothyroxine  50 mcg Oral QAC breakfast  . lisinopril  10 mg Oral Daily  . warfarin  5 mg Oral Once  . Warfarin - Pharmacist Dosing Inpatient   Does not apply Q24H   Continuous Infusions: . sodium chloride    . cefTRIAXone (ROCEPHIN)  IV Stopped (04/24/16 1229)     LOS: 1 day    Time spent: 25 minutes    Loretha Stapler, MD [facility] Pager [phone]  If 7PM-7AM, please contact night-coverage [email or web address] Password  [facility] 04/25/2016, 11:42 AM

## 2016-04-25 NOTE — Consult Note (Signed)
Reason for Consult: Hyponatremia Referring Physician: Dr. [name] is an 83 y.o. female.  HPI: [name] is a new [name] was history of hypertension, a trial fibrillation, hypothyroidism presently came with chest pain and neck pain for the last couple of days. When [name] was evaluated [name] was found to have hyponatremia hence consult is called. According to [name] [name] has chronic diarrhea about 2-3 times a day for the last 6 months. [name] also has diarrhea this morning. [name] denies any fevers chills or sweating. [name] also stated that [name] has history of congestive heart failure and [name] has been on diuretics. Presently [name] doesn't have any nausea or vomiting. [name] pain has improved. His only complaint [name] has is diarrhea. [name] has been taking Imodium as an outpatient over-the-counter. [name] has history of weakness.  Past Medical History:  Diagnosis Date  . Anemia   . Atrial fibrillation (HCC)    Paroxysmal, Flecainide therapy  . Calf pain    September, 2012, at rest  . Cardiomegaly    EF normal, echo, 2003  //   EF 60-65%, echo, June 21, 4980, diastolic dysfunction,  . Carotid bruit    Doppler, December, 2009, no abnormality  . Cataract   . Diverticulosis   . GERD (gastroesophageal reflux disease)   . History of shingles   . HLD (hyperlipidemia)   . HTN (hypertension)   . Hypothyroidism   . Hypothyroidism   . Insomnia   . Normal nuclear stress test    Normal, 2001  . Osteoarthritis   . PONV (postoperative nausea and vomiting)   . Rectal bleeding 2001   diverticulosis and int hemorrhoids on 07/1999 and 02/2010 colonoscopies.  . Stroke ([place])   . Warfarin anticoagulation     Past Surgical History:  Procedure Laterality Date  . BIOPSY  09/29/2015   Procedure: BIOPSY;  Surgeon: Rogene Houston, MD;  [facility];  Service: Endoscopy;;  gastric  . CATARACT EXTRACTION W/PHACO Right 04/15/2014   Procedure: CATARACT EXTRACTION PHACO AND INTRAOCULAR LENS PLACEMENT  (IOC);  Surgeon: Tonny Branch, MD;  Location: [facility];  Service: Ophthalmology;  Laterality: Right;  CDE: 13.30  . CATARACT EXTRACTION W/PHACO Left 05/13/2014   Procedure: CATARACT EXTRACTION PHACO AND INTRAOCULAR LENS PLACEMENT LEFT EYE;  Surgeon: Tonny Branch, MD;  Location: [facility];  Service: Ophthalmology;  Laterality: Left;  CDE:13.00  . CHOLECYSTECTOMY N/A 01/15/2015   Procedure: LAPAROSCOPIC CHOLECYSTECTOMY;  Surgeon: Ralene Ok, MD;  Location: [facility];  Service: General;  Laterality: N/A;  . COLONOSCOPY N/A 09/29/2015   Procedure: COLONOSCOPY;  Surgeon: Rogene Houston, MD;  [facility];  Service: Endoscopy;  Laterality: N/A;  . COLONOSCOPY    . ESOPHAGOGASTRODUODENOSCOPY N/A 01/13/2015   Procedure: ESOPHAGOGASTRODUODENOSCOPY (EGD);  Surgeon: Jerene Bears, MD;  Location: [facility] ENDOS[facility];  Service: Endoscopy;  Laterality: N/A;  . ESOPHAGOGASTRODUODENOSCOPY N/A 09/29/2015   Procedure: ESOPHAGOGASTRODUODENOSCOPY (EGD);  Surgeon: Rogene Houston, MD;  [facility]: [facility];  Service: Endoscopy;  Laterality: N/A;  12:15  . GALLBLADDER SURGERY    . SHOULDER OPEN ROTATOR CUFF REPAIR     bilateral  . TOTAL KNEE ARTHROPLASTY  05/26/10   right  . TOTAL KNEE ARTHROPLASTY  11/17/2010   Procedure: TOTAL KNEE ARTHROPLASTY;  Surgeon: Mauri Pole;  Location: [facility];  Service: Orthopedics;  Laterality: Left;  . TOTAL KNEE ARTHROPLASTY     Right  . TOTAL SHOULDER REPLACEMENT  01/2010   left    Family History  Problem Relation Age of Onset  . COPD Brother   . Atrial fibrillation Brother   . Diabetes Brother   . Liver cancer Brother     Alcohol-related  . Colon cancer Brother   . Heart attack Father   . Rheum arthritis Mother   . Arthritis Mother   . Heart disease Mother   . Coronary artery disease Brother   . Heart attack Daughter   . Gallbladder disease Brother     Social History:  reports that [name] has never smoked. [name] has never used smokeless tobacco. [name] reports that [name] does not  drink alcohol or use drugs.  Allergies:  Allergies  Allergen Reactions  . Penicillins Rash and Other (See Comments)    Has [name] had a PCN reaction causing immediate rash, facial/tongue/throat swelling, SOB or lightheadedness with hypotension: No Has [name] had a PCN reaction causing severe rash involving mucus membranes or skin necrosis: No Has [name] had a PCN reaction that required hospitalization No Has [name] had a PCN reaction occurring within the last 10 years: No If all of the above answers are "NO", then may proceed with Cephalosporin use.    Medications: I have reviewed the [name]'s current medications.  Results for orders placed or performed during the hospital encounter of 04/23/16 (from the past 48 hour(s))  Troponin I     Status: None   Collection Time: 04/23/16 11:08 AM  Result Value Ref Range   Troponin I <0.03 <0.03 ng/mL  Magnesium     Status: Abnormal   Collection Time: 04/23/16 11:08 AM  Result Value Ref Range   Magnesium 1.6 (L) 1.7 - 2.4 mg/dL  Troponin I     Status: None   Collection Time: 04/23/16  4:29 PM  Result Value Ref Range   Troponin I <0.03 <0.03 ng/mL  Osmolality     Status: Abnormal   Collection Time: 04/23/16  4:29 PM  Result Value Ref Range   Osmolality 249 (LL) 275 - 295 mOsm/kg    Comment: REPEATED TO VERIFY CRITICAL RESULT CALLED TO, READ BACK BY AND VERIFIED WITH: [place] [id number] AT 2204 SKEEN,P Performed at [facility], [address] [place], [place] [address]   Basic metabolic panel     Status: Abnormal   Collection Time: 04/23/16  4:29 PM  Result Value Ref Range   Sodium 120 (L) 135 - 145 mmol/L   Potassium 3.5 3.5 - 5.1 mmol/L   Chloride 83 (L) 101 - 111 mmol/L   CO2 26 22 - 32 mmol/L   Glucose, Bld 119 (H) 65 - 99 mg/dL   BUN 6 6 - 20 mg/dL   Creatinine, Ser 0.47 0.44 - 1.00 mg/dL   Calcium 8.3 (L) 8.9 - 10.3 mg/dL   GFR calc non Af Amer >60 >60 mL/min   GFR calc Af Amer >60 >60 mL/min    Comment:  (NOTE) The eGFR has been calculated using the CKD EPI equation. This calculation has not been validated in all clinical situations. eGFR's persistently <60 mL/min signify possible Chronic Kidney Disease.    Anion gap 11 5 - 15  Osmolality, urine     Status: None   Collection Time: 04/23/16  8:39 PM  Result Value Ref Range   Osmolality, Ur 355 300 - 900 mOsm/kg    Comment: Performed at [facility] [address], [place], [place] [address]  Sodium, urine, random     Status: None   Collection Time: 04/23/16  8:39  PM  Result Value Ref Range   Sodium, Ur 83 mmol/L  Troponin I     Status: None   Collection Time: 04/23/16 10:36 PM  Result Value Ref Range   Troponin I <0.03 <0.03 ng/mL  Basic metabolic panel     Status: Abnormal   Collection Time: 04/23/16 10:36 PM  Result Value Ref Range   Sodium 118 (LL) 135 - 145 mmol/L    Comment: CRITICAL RESULT CALLED TO, READ BACK BY AND VERIFIED WITH:  VILLALOBOS,S @2337  ON 04/23/16 BY JUW    Potassium 3.7 3.5 - 5.1 mmol/L   Chloride 81 (L) 101 - 111 mmol/L   CO2 26 22 - 32 mmol/L   Glucose, Bld 129 (H) 65 - 99 mg/dL   BUN 7 6 - 20 mg/dL   Creatinine, Ser 0.56 0.44 - 1.00 mg/dL   Calcium 8.2 (L) 8.9 - 10.3 mg/dL   GFR calc non Af Amer >60 >60 mL/min   GFR calc Af Amer >60 >60 mL/min    Comment: (NOTE) The eGFR has been calculated using the CKD EPI equation. This calculation has not been validated in all clinical situations. eGFR's persistently <60 mL/min signify possible Chronic Kidney Disease.    Anion gap 11 5 - 15  Troponin I     Status: None   Collection Time: 04/24/16  3:37 AM  Result Value Ref Range   Troponin I <0.03 <0.03 ng/mL  Basic metabolic panel     Status: Abnormal   Collection Time: 04/24/16  3:37 AM  Result Value Ref Range   Sodium 119 (LL) 135 - 145 mmol/L    Comment: CRITICAL RESULT CALLED TO, READ BACK BY AND VERIFIED WITH:  VILLALOBOS,S @ 0429 ON 04/24/16 BY JUW    Potassium 3.9 3.5 - 5.1 mmol/L    Chloride 84 (L) 101 - 111 mmol/L   CO2 27 22 - 32 mmol/L   Glucose, Bld 123 (H) 65 - 99 mg/dL   BUN 7 6 - 20 mg/dL   Creatinine, Ser 0.50 0.44 - 1.00 mg/dL   Calcium 7.8 (L) 8.9 - 10.3 mg/dL   GFR calc non Af Amer >60 >60 mL/min   GFR calc Af Amer >60 >60 mL/min    Comment: (NOTE) The eGFR has been calculated using the CKD EPI equation. This calculation has not been validated in all clinical situations. eGFR's persistently <60 mL/min signify possible Chronic Kidney Disease.    Anion gap 8 5 - 15  Protime-INR     Status: Abnormal   Collection Time: 04/24/16  3:42 AM  Result Value Ref Range   Prothrombin Time 29.2 (H) 11.4 - 15.2 seconds   INR 6.25   Basic metabolic panel     Status: Abnormal   Collection Time: 04/24/16  6:29 AM  Result Value Ref Range   Sodium 122 (L) 135 - 145 mmol/L   Potassium 3.8 3.5 - 5.1 mmol/L   Chloride 85 (L) 101 - 111 mmol/L   CO2 25 22 - 32 mmol/L   Glucose, Bld 126 (H) 65 - 99 mg/dL   BUN 7 6 - 20 mg/dL   Creatinine, Ser 0.51 0.44 - 1.00 mg/dL   Calcium 8.3 (L) 8.9 - 10.3 mg/dL   GFR calc non Af Amer >60 >60 mL/min   GFR calc Af Amer >60 >60 mL/min    Comment: (NOTE) The eGFR has been calculated using the CKD EPI equation. This calculation has not been validated in all clinical situations. eGFR's persistently <60  mL/min signify possible Chronic Kidney Disease.    Anion gap 12 5 - 15  Magnesium     Status: Abnormal   Collection Time: 04/24/16  6:29 AM  Result Value Ref Range   Magnesium 2.5 (H) 1.7 - 2.4 mg/dL  TSH     Status: None   Collection Time: 04/24/16 11:45 AM  Result Value Ref Range   TSH 0.578 0.350 - 4.500 uIU/mL    Comment: Performed by a 3rd Generation assay with a functional sensitivity of <=0.01 uIU/mL.  Basic metabolic panel     Status: Abnormal   Collection Time: 04/24/16 11:48 AM  Result Value Ref Range   Sodium 120 (L) 135 - 145 mmol/L   Potassium 4.4 3.5 - 5.1 mmol/L   Chloride 85 (L) 101 - 111 mmol/L   CO2 28 22 -  32 mmol/L   Glucose, Bld 89 65 - 99 mg/dL   BUN 9 6 - 20 mg/dL   Creatinine, Ser 0.58 0.44 - 1.00 mg/dL   Calcium 8.1 (L) 8.9 - 10.3 mg/dL   GFR calc non Af Amer >60 >60 mL/min   GFR calc Af Amer >60 >60 mL/min    Comment: (NOTE) The eGFR has been calculated using the CKD EPI equation. This calculation has not been validated in all clinical situations. eGFR's persistently <60 mL/min signify possible Chronic Kidney Disease.    Anion gap 7 5 - 15  Osmolality, urine     Status: Abnormal   Collection Time: 04/24/16 12:45 PM  Result Value Ref Range   Osmolality, Ur 219 (L) 300 - 900 mOsm/kg    Comment: PERFORMED AT [facility] Performed at [facility], [place] [address], [place], [place] [address]   Protime-INR     Status: Abnormal   Collection Time: 04/25/16  3:03 AM  Result Value Ref Range   Prothrombin Time 24.6 (H) 11.4 - 15.2 seconds   INR 4.35   Basic metabolic panel     Status: Abnormal   Collection Time: 04/25/16  3:03 AM  Result Value Ref Range   Sodium 123 (L) 135 - 145 mmol/L   Potassium 5.1 3.5 - 5.1 mmol/L   Chloride 91 (L) 101 - 111 mmol/L   CO2 26 22 - 32 mmol/L   Glucose, Bld 105 (H) 65 - 99 mg/dL   BUN 16 6 - 20 mg/dL   Creatinine, Ser 0.60 0.44 - 1.00 mg/dL   Calcium 8.0 (L) 8.9 - 10.3 mg/dL   GFR calc non Af Amer >60 >60 mL/min   GFR calc Af Amer >60 >60 mL/min    Comment: (NOTE) The eGFR has been calculated using the CKD EPI equation. This calculation has not been validated in all clinical situations. eGFR's persistently <60 mL/min signify possible Chronic Kidney Disease.    Anion gap 6 5 - 15    US Renal  Result Date: 04/23/2016 CLINICAL DATA:  Hyponatremia. EXAM: RENAL / URINARY TRACT ULTRASOUND COMPLETE COMPARISON:  CT of 09/28/2015. FINDINGS: Right Kidney: Length: 10.6 cm. 2.9 x 3.2 x 2.9 cm upper pole right renal cyst. Normal cortical thickness and echogenicity. No hydronephrosis. Left Kidney: Length: 9.9 cm. Echogenicity within normal  limits. No mass or hydronephrosis visualized. Bladder: Appears normal for degree of bladder distention. IMPRESSION: No hydronephrosis or other acute process. Upper pole right renal cyst. Electronically Signed   By: Abigail Miyamoto M.D.   On: 04/23/2016 16:31    Review of Systems  Constitutional: Positive for malaise/fatigue. Negative for  chills and fever.  Respiratory: Negative for shortness of breath.   Cardiovascular: Positive for chest pain. Negative for orthopnea, claudication and leg swelling.  Gastrointestinal: Positive for diarrhea. Negative for abdominal pain, nausea and vomiting.  Neurological: Positive for weakness.   Blood pressure (!) 160/60, pulse 75, temperature 98.8 F (37.1 C), temperature source Oral, resp. rate 18, height 5' 3"  (1.6 m), weight 57.7 kg (127 lb 3.3 oz), SpO2 96 %. Physical Exam  Constitutional: [name] is oriented to person, place, and time. No distress.  HENT:  Mouth/Throat: No oropharyngeal exudate.  Eyes: No scleral icterus.  Neck: No JVD present.  Cardiovascular:  Irregular rate and rhythm  Respiratory: No respiratory distress. [name] has no wheezes.  GI: [name] exhibits no distension. There is no tenderness.  Musculoskeletal: [name] exhibits edema.  Neurological: [name] is alert and oriented to person, place, and time.    Assessment/Plan: Problem #1 hyponatremia: [name] with history of hypokalemia, hypomagnesemia and was also on Lasix. Hence possibly this is hypovolemic hyponatremia. [name] sodium is slightly better but remains low. [name] urine osmolality was 35, urine osmole is 83 and serum osmolality was 249 when [name] came. As stated above [name] has been on Lasix. Problem #2 hypokalemia has corrected Problem #3 hyponatremia Problem #4 of atrial fibrillation [name] heart rate is controlled Problem #5 history of cardiomegaly Problem #6 history of hypothyroidism: [name] is on Synthroid Problem #7 history of hypertension: [name] blood pressure is reasonably  controlled. Problem #8 chronic diarrhea: Etiology at this moment is not clear. According to the [name] [name] doesn't have any previous workup. Plan: 1] We'll restrict [name] free water intake 2] We'll start [name] on Lasix 20 mg IV twice a day 3] Normal saline at 75 mL per hour 4] Will liberalize [name] salt intake. We'll check a renal panel in the morning.  Khaled Herda S 04/25/2016, 10:28 AM

## 2016-04-25 NOTE — Progress Notes (Signed)
ANTICOAGULATION CONSULT NOTE - follow up  Pharmacy Consult for COUMADIN Indication: atrial fibrillation  Allergies  Allergen Reactions  . Penicillins Rash and Other (See Comments)    Has patient had a PCN reaction causing immediate rash, facial/tongue/throat swelling, SOB or lightheadedness with hypotension: No Has patient had a PCN reaction causing severe rash involving mucus membranes or skin necrosis: No Has patient had a PCN reaction that required hospitalization No Has patient had a PCN reaction occurring within the last 10 years: No If all of the above answers are "NO", then may proceed with Cephalosporin use.   Patient Measurements: Height: 5\' 3"  (160 cm) Weight: 127 lb 3.3 oz (57.7 kg) IBW/kg (Calculated) : 52.4  Vital Signs: Temp: 98.8 F (37.1 C) (04/22 0600) Temp Source: Oral (04/22 0600) BP: 160/60 (04/22 0600) Pulse Rate: 75 (04/22 0600)  Labs:  Recent Labs  04/23/16 0626  04/23/16 1629 04/23/16 2236 04/24/16 0337 04/24/16 0342 04/24/16 0629 04/24/16 1148 04/25/16 0303  HGB 13.4  --   --   --   --   --   --   --   --   HCT 38.4  --   --   --   --   --   --   --   --   PLT 310  --   --   --   --   --   --   --   --   LABPROT 24.6*  --   --   --   --  29.2*  --   --  24.6*  INR 2.18  --   --   --   --  2.70  --   --  2.18  CREATININE 0.57  --  0.47 0.56 0.50  --  0.51 0.58 0.60  TROPONINI  --   < > <0.03 <0.03 <0.03  --   --   --   --   < > = values in this interval not displayed.  Estimated Creatinine Clearance: 44.8 mL/min (by C-G formula based on SCr of 0.6 mg/dL).   Medical History: Past Medical History:  Diagnosis Date  . Anemia   . Atrial fibrillation (HCC)    Paroxysmal, Flecainide therapy  . Calf pain    September, 2012, at rest  . Cardiomegaly    EF normal, echo, 2003  //   EF 60-65%, echo, June 22, 2991, diastolic dysfunction,  . Carotid bruit    Doppler, December, 2009, no abnormality  . Cataract   . Diverticulosis   . GERD  (gastroesophageal reflux disease)   . History of shingles   . HLD (hyperlipidemia)   . HTN (hypertension)   . Hypothyroidism   . Hypothyroidism   . Insomnia   . Normal nuclear stress test    Normal, 2001  . Osteoarthritis   . PONV (postoperative nausea and vomiting)   . Rectal bleeding 2001   diverticulosis and int hemorrhoids on 07/1999 and 02/2010 colonoscopies.  . Stroke ([place])   . Warfarin anticoagulation     Medications:  Prescriptions Prior to Admission  Medication Sig Dispense Refill Last Dose  . atenolol (TENORMIN) 25 MG tablet Take 25 mg by mouth daily.    04/23/2016 at 0400  . atorvastatin (LIPITOR) 20 MG tablet Take 0.5 tablets (10 mg total) by mouth at bedtime. 45 tablet 0 04/22/2016 at Unknown time  . flecainide (TAMBOCOR) 100 MG tablet TAKE 1 TABLET BY MOUTH TWO  TIMES DAILY 180 tablet 1 04/23/2016 at  0400  . furosemide (LASIX) 40 MG tablet Take 20 mg by mouth daily.   04/23/2016 at Unknown time  . levETIRAcetam (KEPPRA) 500 MG tablet Take 250 mg by mouth 2 (two) times daily.   04/23/2016 at Unknown time  . levothyroxine (SYNTHROID, LEVOTHROID) 50 MCG tablet Take 1 tablet daily before breakfast. Must beseen before next refill 30 tablet 0 04/23/2016 at Unknown time  . lisinopril (PRINIVIL,ZESTRIL) 10 MG tablet Take 10 mg by mouth daily.   04/23/2016 at Unknown time  . nitrofurantoin, macrocrystal-monohydrate, (MACROBID) 100 MG capsule Take 1 capsule (100 mg total) by mouth 2 (two) times daily. 10 capsule 0 04/22/2016 at Unknown time  . oxybutynin (DITROPAN) 5 MG tablet Take 1 tablet (5 mg total) by mouth 3 (three) times daily. 90 tablet 0 04/23/2016 at Unknown time  . potassium chloride SA (K-DUR,KLOR-CON) 20 MEQ tablet Take 20 mEq by mouth daily with breakfast.    04/23/2016 at Unknown time  . traMADol (ULTRAM) 50 MG tablet Take 0.5-1 tablets (25-50 mg total) by mouth every 8 (eight) hours as needed. 30 tablet 0 04/23/2016 at Unknown time  . vitamin B-12 (CYANOCOBALAMIN) 500 MCG tablet  Take 500 mcg by mouth every Monday, Wednesday, and Friday.    04/23/2016 at Unknown time  . warfarin (COUMADIN) 5 MG tablet Take 1/2 tablet on mondays and fridays. Take 1 tablet all other days. 85 tablet 1 04/23/2016 at 0400  . zolpidem (AMBIEN) 5 MG tablet TAKE 1/2 TO 1 TABLET AT BEDTIME AS NEEDED FOR INSOMNIA 30 tablet 2 04/22/2016 at Unknown time   Assessment: INR therapeutic but starting to trend down.  Resume Coumadin for afib.  Home dose noted above.   Goal of Therapy:  INR 2-3 Monitor platelets by anticoagulation protocol: Yes   Plan:  Coumadin 5mg  po today x 1 INR daily  [place] Crane, [place]al[place] Bramel A 04/25/2016,8:45 AM

## 2016-04-26 LAB — RENAL FUNCTION PANEL
ALBUMIN: 3.5 g/dL (ref 3.5–5.0)
Anion gap: 10 (ref 5–15)
BUN: 9 mg/dL (ref 6–20)
CO2: 26 mmol/L (ref 22–32)
Calcium: 8.4 mg/dL — ABNORMAL LOW (ref 8.9–10.3)
Chloride: 93 mmol/L — ABNORMAL LOW (ref 101–111)
Creatinine, Ser: 0.5 mg/dL (ref 0.44–1.00)
GFR calc Af Amer: >60 mL/min (ref >60)
GFR calc non Af Amer: >60 mL/min (ref >60)
GLUCOSE: 111 mg/dL — AB (ref 65–99)
PHOSPHORUS: 3.1 mg/dL (ref 2.5–4.6)
Potassium: 4 mmol/L (ref 3.5–5.1)
Sodium: 129 mmol/L — ABNORMAL LOW (ref 135–145)

## 2016-04-26 LAB — PROTIME-INR
INR: 1.39
PROTHROMBIN TIME: 17.2 s — AB (ref 11.4–15.2)

## 2016-04-26 MED ORDER — CEFUROXIME AXETIL 500 MG PO TABS: 500.0000 mg | ORAL_TABLET | Freq: Two times a day (BID) | ORAL | 0 refills | Status: AC

## 2016-04-26 MED ORDER — WARFARIN SODIUM 5 MG PO TABS: 6.0000 mg | ORAL_TABLET | Freq: Once | ORAL | Status: DC

## 2016-04-26 NOTE — Care Management Note (Addendum)
Case Management Note  Patient Details  Name: [name] MRN: [id number] Date of Birth: 01-06-1932  Subjective/Objective:                  Pt admitted with chest pain. She is from home, lives with her husband and is ind with ALD's. She has PCP, drives herself to appointments and has no difficulty affording or managing her medications. She uses RW with ambulation. She has BSC and WC at home if needed. She has used HH in the past but is not active pta. Pt seen by PT and no PT f/u recommended. Pt communicated no needs. Husband at the bedside for DC plan discussion.   Action/Plan: Pt discharging home with self care. Pt on ACO registry and will automatically be enrolled in [facility] transition calls for general DC.  Expected Discharge Date:  04/26/16               Expected Discharge Plan:  Home/Self Care  In-House Referral:  NA  Discharge planning Services  CM Consult  Post Acute Care Choice:  NA Choice offered to:  NA  Status of Service:  Completed, signed off  Sherald Barge, RN 04/26/2016, 4:09 PM

## 2016-04-26 NOTE — Care Management Important Message (Signed)
Important Message  Patient Details  Name: [name] MRN: [id number] Date of Birth: September 07, 1932   Medicare Important Message Given:  Yes    Sherald Barge, RN 04/26/2016, 4:09 PM

## 2016-04-26 NOTE — Progress Notes (Signed)
Patient has not had a bowel movement in over 24 hours, and no diarrhea charted since admission. Last two results for C Diff toxin were negatvie 04/07/15 and 05/07/15. Paged Dr. Adair Patter that testing and precautions for C Diff may not be appropriate at this time.

## 2016-04-26 NOTE — Progress Notes (Signed)
Attempted to measure pt's urine several times but she doesn't understand how to use the hat in the toilet and continues to miss it. Will continue to educate.

## 2016-04-26 NOTE — Progress Notes (Signed)
Pharmacy Antibiotic Note  [name] is a 83 y.o. female admitted on 04/23/2016 with UTI.  Pharmacy has been consulted for ROCEPHIN dosing.  Plan: Rocephin 1gm IV q24hrs Dose stable for age, weight, renal function and indication. No pharmacokinetic monitoring needed. Sign off.  Height: 5\' 3"  (160 cm) Weight: 127 lb 3.3 oz (57.7 kg) IBW/kg (Calculated) : 52.4  Temp (24hrs), Avg:98.4 F (36.9 C), Min:98.2 F (36.8 C), Max:98.7 F (37.1 C)   Recent Labs Lab 04/23/16 0626  04/24/16 0337 04/24/16 0629 04/24/16 1148 04/25/16 0303 04/26/16 0500  WBC 6.3  --   --   --   --   --   --   CREATININE 0.57  < > 0.50 0.51 0.58 0.60 0.50  < > = values in this interval not displayed.  Estimated Creatinine Clearance: 44.8 mL/min (by C-G formula based on SCr of 0.5 mg/dL).    Allergies  Allergen Reactions  . Penicillins Rash and Other (See Comments)    Has patient had a PCN reaction causing immediate rash, facial/tongue/throat swelling, SOB or lightheadedness with hypotension: No Has patient had a PCN reaction causing severe rash involving mucus membranes or skin necrosis: No Has patient had a PCN reaction that required hospitalization No Has patient had a PCN reaction occurring within the last 10 years: No If all of the above answers are "NO", then may proceed with Cephalosporin use.   Antimicrobials this admission:  [place] 4/21 >>   Dose adjustments this admission:  n/a   Microbiology results:   4/19 UCx: E.Coli (pan sens)  Thank you for allowing pharmacy to be a part of this patient's care.  Pricilla Larsson 04/26/2016 11:20 AM

## 2016-04-26 NOTE — Progress Notes (Signed)
ANTICOAGULATION CONSULT NOTE - follow up  Pharmacy Consult for COUMADIN Indication: atrial fibrillation  Allergies  Allergen Reactions  . Penicillins Rash and Other (See Comments)    Has patient had a PCN reaction causing immediate rash, facial/tongue/throat swelling, SOB or lightheadedness with hypotension: No Has patient had a PCN reaction causing severe rash involving mucus membranes or skin necrosis: No Has patient had a PCN reaction that required hospitalization No Has patient had a PCN reaction occurring within the last 10 years: No If all of the above answers are "NO", then may proceed with Cephalosporin use.   Patient Measurements: Height: 5\' 3"  (160 cm) Weight: 127 lb 3.3 oz (57.7 kg) IBW/kg (Calculated) : 52.4  Vital Signs: Temp: 98.2 F (36.8 C) (04/23 0603) Temp Source: Oral (04/23 0603) BP: 158/62 (04/23 0603) Pulse Rate: 68 (04/23 0603)  Labs:  Recent Labs  04/23/16 1629 04/23/16 2236 04/24/16 0337 04/24/16 0342  04/24/16 1148 04/25/16 0303 04/26/16 0500  LABPROT  --   --   --  29.2*  --   --  24.6* 17.2*  INR  --   --   --  2.70  --   --  2.18 1.39  CREATININE 0.47 0.56 0.50  --   < > 0.58 0.60 0.50  TROPONINI <0.03 <0.03 <0.03  --   --   --   --   --   < > = values in this interval not displayed.  Estimated Creatinine Clearance: 44.8 mL/min (by C-G formula based on SCr of 0.5 mg/dL).   Medical History: Past Medical History:  Diagnosis Date  . Anemia   . Atrial fibrillation (HCC)    Paroxysmal, Flecainide therapy  . Calf pain    September, 2012, at rest  . Cardiomegaly    EF normal, echo, 2003  //   EF 60-65%, echo, June 22, 3823, diastolic dysfunction,  . Carotid bruit    Doppler, December, 2009, no abnormality  . Cataract   . Diverticulosis   . GERD (gastroesophageal reflux disease)   . History of shingles   . HLD (hyperlipidemia)   . HTN (hypertension)   . Hypothyroidism   . Hypothyroidism   . Insomnia   . Normal nuclear stress test     Normal, 2001  . Osteoarthritis   . PONV (postoperative nausea and vomiting)   . Rectal bleeding 2001   diverticulosis and int hemorrhoids on 07/1999 and 02/2010 colonoscopies.  . Stroke ([place])   . Warfarin anticoagulation     Medications:  Prescriptions Prior to Admission  Medication Sig Dispense Refill Last Dose  . atenolol (TENORMIN) 25 MG tablet Take 25 mg by mouth daily.    04/23/2016 at 0400  . atorvastatin (LIPITOR) 20 MG tablet Take 0.5 tablets (10 mg total) by mouth at bedtime. 45 tablet 0 04/22/2016 at Unknown time  . flecainide (TAMBOCOR) 100 MG tablet TAKE 1 TABLET BY MOUTH TWO  TIMES DAILY 180 tablet 1 04/23/2016 at 0400  . furosemide (LASIX) 40 MG tablet Take 20 mg by mouth daily.   04/23/2016 at Unknown time  . levETIRAcetam (KEPPRA) 500 MG tablet Take 250 mg by mouth 2 (two) times daily.   04/23/2016 at Unknown time  . levothyroxine (SYNTHROID, LEVOTHROID) 50 MCG tablet Take 1 tablet daily before breakfast. Must beseen before next refill 30 tablet 0 04/23/2016 at Unknown time  . lisinopril (PRINIVIL,ZESTRIL) 10 MG tablet Take 10 mg by mouth daily.   04/23/2016 at Unknown time  . nitrofurantoin, macrocrystal-monohydrate, (MACROBID) 100  MG capsule Take 1 capsule (100 mg total) by mouth 2 (two) times daily. 10 capsule 0 04/22/2016 at Unknown time  . oxybutynin (DITROPAN) 5 MG tablet Take 1 tablet (5 mg total) by mouth 3 (three) times daily. 90 tablet 0 04/23/2016 at Unknown time  . potassium chloride SA (K-DUR,KLOR-CON) 20 MEQ tablet Take 20 mEq by mouth daily with breakfast.    04/23/2016 at Unknown time  . traMADol (ULTRAM) 50 MG tablet Take 0.5-1 tablets (25-50 mg total) by mouth every 8 (eight) hours as needed. 30 tablet 0 04/23/2016 at Unknown time  . vitamin B-12 (CYANOCOBALAMIN) 500 MCG tablet Take 500 mcg by mouth every Monday, Wednesday, and Friday.    04/23/2016 at Unknown time  . warfarin (COUMADIN) 5 MG tablet Take 1/2 tablet on mondays and fridays. Take 1 tablet all other days.  85 tablet 1 04/23/2016 at 0400  . zolpidem (AMBIEN) 5 MG tablet TAKE 1/2 TO 1 TABLET AT BEDTIME AS NEEDED FOR INSOMNIA 30 tablet 2 04/22/2016 at Unknown time   Assessment: INR now subtherapeutic. Coumadin for afib.  Home dose noted above.   Goal of Therapy:  INR 2-3 Monitor platelets by anticoagulation protocol: Yes   Plan:  Coumadin 6mg  po today x 1 INR daily Monitor for signs and symptoms of bleeding.   Pricilla Larsson 04/26/2016,11:11 AM

## 2016-04-26 NOTE — Evaluation (Signed)
Physical Therapy Evaluation Patient Details Name: [name] MRN: [id number] DOB: 1932/02/06 Today's Date: 04/26/2016   History of Present Illness  83 y.o. female with medical history significant of HTN, paroxysmal atrial fibrillation, hypothyroidism who presents for chest pain that started yesterday.  Patient states she believes the pain started yesterday morning and she states she was not doing anything strenuous at that time.  Patient put heating pads on her chest and states this helped.  Pain was mostly constant throughout the day.  She states she believes the pain was going to improve so she did not come in for evaluation.  She says she got up this am and pain was much more significant.  She also had pain in her sinus area and in the back of her head.  She just saw her primary care physician on 4/16 and again 4/19 for possible urinary tract infection.  She is taking cranberry, oxybutynin and macrobid for this. She voices that she is still having burning with urination even with the medications.  Reports more coughing at night when she is laying down.  Denies waking up with a sour or metallic taste in her mouth.  States she has seen blood in her urine but the last time was about 2 weeks ago.  Denies fevers or chills, denies shortness of breath.  Says she occasionally gets bilateral leg swelling.  Patient is chest pain free at time of admission.  Dx: hyponatremia, UTI, A-fib rate controlled.      Clinical Impression  Pt received in bed, husband present, and pt is agreeable to PT evaluation.  Pt states she uses her RW at night, and occasionally during the day.  She is independent with both dressing and bathing.  She does require increased time to follow commands appropriately, and is only oriented X2 - unable to stay why she was in the hospital, or give the correct date.  She is recommended to return home with 24/7 supervision/assistance with use of the RW.    Follow Up Recommendations  Supervision/Assistance - 24 hour    Equipment Recommendations  None recommended by PT    Recommendations for Other Services       Precautions / Restrictions Precautions Precautions: Fall Precaution Comments: fall 2 weeks ago at the pharmacy, and then fell here in the hospital in the hallway - RN reports that she was found sitting on the floor having a bowel movement in the middle of the hallway.   Restrictions Weight Bearing Restrictions: No      Mobility  Bed Mobility Overal bed mobility: Independent                Transfers Overall transfer level: Modified independent Equipment used: Rolling walker (2 wheeled)                Ambulation/Gait Ambulation/Gait assistance: Supervision Ambulation Distance (Feet): 200 Feet Assistive device: Rolling walker (2 wheeled) Gait Pattern/deviations: WFL(Within Functional Limits)     General Gait Details: Pt requries directional cues due to poor abiltiy to find her way back to her room.    Stairs            Wheelchair Mobility    Modified Rankin (Stroke Patients Only)       Balance Overall balance assessment: History of Falls;Needs assistance Sitting-balance support: Bilateral upper extremity supported;Feet supported Sitting balance-Leahy Scale: Good     Standing balance support: Bilateral upper extremity supported Standing balance-Leahy Scale: Fair  Pertinent Vitals/Pain Pain Assessment: No/denies pain    Home Living   Living Arrangements: Spouse/significant other   Type of Home: House Home Access: Stairs to enter   [employer] of Steps: 1step up onto the porch.  Home Layout: One level Home Equipment: Walker - 2 wheels;Bedside commode;Tub bench;Grab bars - tub/shower      Prior Function Level of Independence: Independent with assistive device(s)   Gait / Transfers Assistance Needed: Pt ambulates with RW at night.  She uses it sometimes  during the day and when in the community  ADL's / Homemaking Assistance Needed: independent.         Hand Dominance   Dominant Hand: Right    Extremity/Trunk Assessment   Upper Extremity Assessment Upper Extremity Assessment: Overall WFL for tasks assessed    Lower Extremity Assessment Lower Extremity Assessment: Overall WFL for tasks assessed       Communication   Communication: No difficulties  Cognition Arousal/Alertness: Awake/alert Behavior During Therapy: WFL for tasks assessed/performed Overall Cognitive Status: Impaired/Different from baseline Area of Impairment: Orientation                 Orientation Level: Disoriented to;Situation;Time             General Comments: Pt was unable to state why she came to the hospital, and then stated that the month was May.        General Comments      Exercises     Assessment/Plan    PT Assessment Patent does not need any further PT services  PT Problem List         PT Treatment Interventions      PT Goals (Current goals can be found in the Care Plan section)  Acute Rehab PT Goals Patient Stated Goal: To go home PT Goal Formulation: All assessment and education complete, DC therapy    Frequency     Barriers to discharge        Co-evaluation               End of Session Equipment Utilized During Treatment: Gait belt Activity Tolerance: Patient tolerated treatment well Patient left: in bed;with bed alarm set;with family/visitor present Nurse Communication: Mobility status (Katie, RN notified of pt's mobility status, and moblity sheet left in the room. ) PT Visit Diagnosis: History of falling (Z91.81)    Time: 9163-8466 PT Time Calculation (min) (ACUTE ONLY): 33 min   Charges:   PT Evaluation $PT Eval Low Complexity: 1 Procedure PT Treatments $Gait Training: 8-22 mins   PT G Codes:   PT G-Codes **NOT FOR INPATIENT CLASS** Functional Assessment Tool Used: AM-PAC 6 Clicks Basic  Mobility Functional Limitation: Mobility: Walking and moving around Mobility: Walking and Moving Around Current Status (Z9935): At least 20 percent but less than 40 percent impaired, limited or restricted Mobility: Walking and Moving Around Goal Status [phone]): At least 20 percent but less than 40 percent impaired, limited or restricted Mobility: Walking and Moving Around Discharge Status [phone]): At least 20 percent but less than 40 percent impaired, limited or restricted    Beth Christinea Brizuela, PT, DPT X: [phone]

## 2016-04-26 NOTE — Discharge Summary (Signed)
Physician Discharge Summary  [name] GYI:[id number] DOB: Sep 26, 1932 DOA: 04/23/2016  PCP: Kenn File, MD  Admit date: 04/23/2016 Discharge date: 04/26/2016  Admitted From: Home  Disposition:  Home   Recommendations for Outpatient Follow-up:  1. Follow up with PCP within 1 week 2. Please take antibiotic as prescribed 3. Please obtain BMP/CBC in one week 4. Limit fluid intake to 1554ml/day 5. Discard previous antibiotic from last office visit  Home Health: None Equipment/Devices: None   Discharge Condition: Stable CODE STATUS: DNR Diet recommendation: Heart healthy diet with 1524ml/day fluid restriction  Brief/Interim Summary: Terri Momon Knightis a 83 y.o.femalewith medical history significant of HTN, paroxysmal atrial fibrillation, hypothyroidism who presents for chest pain that started yesterday. [name] states she believes the pain started yesterday morning and she states she was not doing anything strenuous at that time. [name] put heating pads on her chest and states this helped. Pain was mostly constant throughout the day. She states she believes the pain was going to improve so she did not come in for evaluation. She says she got up this am and pain was much more significant. She also had pain in her sinus area and in the back of her head. She just saw her primary care physician on 4/16 and again 4/19 for possible urinary tract infection. She is taking cranberry, oxybutynin and macrobid for this. She voices that she is still having burning with urination even with the medications. Reports more coughing at night when she is laying down. Denies waking up with a sour or metallic taste in her mouth. States she has seen blood in her urine but the last time was about 2 weeks ago. Denies fevers or chills, denies shortness of breath. Says she occasionally gets bilateral leg swelling. [name] is chest pain free at time of admission.  [name] fluid was restricted during  her hospitalization and she was given IV lasix.  Nephrology was consulted and her sodium increased to 129.  She was found to have an E. Coli urinary tract infection and was started on rocephin.  She was transitioned to oral antibiotics to continue outpatient.  She was told to limit her fluid consumption to 1541ml/day.  She was seen by PT prior to discharge and was found to have no outpatient PT needs.  Discharge Diagnoses:  Principal Problem:   Chest pain Active Problems:   Warfarin anticoagulation   Hypothyroidism   Paroxysmal atrial fibrillation (HCC)   HTN (hypertension)   Hyponatremia   Hypomagnesemia    Discharge Instructions  Discharge Instructions    Call MD for:  difficulty breathing, headache or visual disturbances    Complete by:  As directed    Call MD for:  extreme fatigue    Complete by:  As directed    Call MD for:  hives    Complete by:  As directed    Call MD for:  persistant dizziness or light-headedness    Complete by:  As directed    Call MD for:  persistant nausea and vomiting    Complete by:  As directed    Call MD for:  severe uncontrolled pain    Complete by:  As directed    Call MD for:  temperature >100.4    Complete by:  As directed    Diet general    Complete by:  As directed    Restrict fluid intake to 1595ml (1.5L)/ day Limit fatty food intake Discuss with your PCP your consistent diarrhea Get labwork at PCP  follow up Schedule follow up with PCP this week   Increase activity slowly    Complete by:  As directed      Allergies as of 04/26/2016      Reactions   Penicillins Rash, Other (See Comments)   Has [name] had a PCN reaction causing immediate rash, facial/tongue/throat swelling, SOB or lightheadedness with hypotension: No Has [name] had a PCN reaction causing severe rash involving mucus membranes or skin necrosis: No Has [name] had a PCN reaction that required hospitalization No Has [name] had a PCN reaction occurring within the last  10 years: No If all of the above answers are "NO", then may proceed with Cephalosporin use.      Medication List    STOP taking these medications   flecainide 100 MG tablet Commonly known as:  TAMBOCOR   nitrofurantoin (macrocrystal-monohydrate) 100 MG capsule Commonly known as:  MACROBID   vitamin B-12 500 MCG tablet Commonly known as:  CYANOCOBALAMIN   zolpidem 5 MG tablet Commonly known as:  AMBIEN     TAKE these medications   atenolol 25 MG tablet Commonly known as:  TENORMIN Take 25 mg by mouth daily.   atorvastatin 20 MG tablet Commonly known as:  LIPITOR Take 0.5 tablets (10 mg total) by mouth at bedtime.   cefUROXime 500 MG tablet Commonly known as:  CEFTIN Take 1 tablet (500 mg total) by mouth 2 (two) times daily with a meal.   furosemide 40 MG tablet Commonly known as:  LASIX Take 20 mg by mouth daily.   levETIRAcetam 500 MG tablet Commonly known as:  KEPPRA Take 250 mg by mouth 2 (two) times daily.   levothyroxine 50 MCG tablet Commonly known as:  SYNTHROID, LEVOTHROID Take 1 tablet daily before breakfast. Must beseen before next refill   lisinopril 10 MG tablet Commonly known as:  PRINIVIL,ZESTRIL Take 10 mg by mouth daily.   oxybutynin 5 MG tablet Commonly known as:  DITROPAN Take 1 tablet (5 mg total) by mouth 3 (three) times daily.   potassium chloride SA 20 MEQ tablet Commonly known as:  K-DUR,KLOR-CON Take 20 mEq by mouth daily with breakfast.   traMADol 50 MG tablet Commonly known as:  ULTRAM Take 0.5-1 tablets (25-50 mg total) by mouth every 8 (eight) hours as needed.   warfarin 5 MG tablet Commonly known as:  COUMADIN Take 1/2 tablet on mondays and fridays. Take 1 tablet all other days.       Allergies  Allergen Reactions  . Penicillins Rash and Other (See Comments)    Has [name] had a PCN reaction causing immediate rash, facial/tongue/throat swelling, SOB or lightheadedness with hypotension: No Has [name] had a PCN  reaction causing severe rash involving mucus membranes or skin necrosis: No Has [name] had a PCN reaction that required hospitalization No Has [name] had a PCN reaction occurring within the last 10 years: No If all of the above answers are "NO", then may proceed with Cephalosporin use.    Consultations:  Cardiology  Nephrology   Procedures/Studies: Dg Chest 2 View  Result Date: 04/23/2016 CLINICAL DATA:  Tightness in the chest EXAM: CHEST  2 VIEW COMPARISON:  08/23/2015 FINDINGS: Cardiac shadow is within normal limits. The lungs are well aerated bilaterally. Mild bibasilar atelectatic changes are seen. Postsurgical changes in left shoulder are noted. IMPRESSION: Mild bibasilar atelectasis. Electronically Signed   By: Inez Catalina M.D.   On: 04/23/2016 07:15   Ct Head Wo Contrast  Result Date: 04/25/2016 CLINICAL DATA:  Pt states "they told me I fell this morning." Pt states the back of her head hurt prior to admission on 04/23/16. Pt is on warfarin.hx of HTN, stroke/bbj EXAM: CT HEAD WITHOUT CONTRAST TECHNIQUE: Contiguous axial images were obtained from the base of the skull through the vertex without intravenous contrast. COMPARISON:  03/16/2016 FINDINGS: Brain: No evidence of acute infarction, hemorrhage, hydrocephalus, extra-axial collection or mass lesion/mass effect. Ventricles are normal in size for age. Patchy white matter hypoattenuation is noted consistent with moderate chronic microvascular ischemic change, stable. Vascular: No hyperdense vessel or unexpected calcification. Skull: Normal. Negative for fracture or focal lesion. Sinuses/Orbits: Visualize globes and orbits are unremarkable. Visualized sinuses and mastoid air cells are clear. Other: None. IMPRESSION: 1. No acute intracranial abnormalities. 2. Moderate chronic microvascular ischemic change. Stable appearance from the prior head CT. Electronically Signed   By: Lajean Manes M.D.   On: 04/25/2016 13:19   US Renal  Result  Date: 04/23/2016 CLINICAL DATA:  Hyponatremia. EXAM: RENAL / URINARY TRACT ULTRASOUND COMPLETE COMPARISON:  CT of 09/28/2015. FINDINGS: Right Kidney: Length: 10.6 cm. 2.9 x 3.2 x 2.9 cm upper pole right renal cyst. Normal cortical thickness and echogenicity. No hydronephrosis. Left Kidney: Length: 9.9 cm. Echogenicity within normal limits. No mass or hydronephrosis visualized. Bladder: Appears normal for degree of bladder distention. IMPRESSION: No hydronephrosis or other acute process. Upper pole right renal cyst. Electronically Signed   By: Abigail Miyamoto M.D.   On: 04/23/2016 16:31      Subjective: [name] reports she did not rest much last night at all. States she just couldn't fall asleep.  She then reports that she had numerous falls yesterday (discussed with nursing- no falls).  Discharge Exam: Vitals:   04/25/16 2131 04/26/16 0603  BP: (!) 166/72 (!) 158/62  Pulse: 77 68  Resp: 18 18  Temp: 98.4 F (36.9 C) 98.2 F (36.8 C)   Vitals:   04/25/16 0600 04/25/16 1300 04/25/16 2131 04/26/16 0603  BP: (!) 160/60 (!) 172/66 (!) 166/72 (!) 158/62  Pulse: 75 70 77 68  Resp: 18 18 18 18   Temp: 98.8 F (37.1 C) 98.7 F (37.1 C) 98.4 F (36.9 C) 98.2 F (36.8 C)  TempSrc: Oral Oral Oral Oral  SpO2: 96% 97% 97% 98%  Weight:      Height:        General: Pt is alert, awake, not in acute distress Cardiovascular: RRR, S1/S2 +, no rubs, no gallops Respiratory: CTA bilaterally, no wheezing, no rhonchi Abdominal: Soft, NT, ND, bowel sounds + Extremities: no edema, no cyanosis    The results of significant diagnostics from this hospitalization (including imaging, microbiology, ancillary and laboratory) are listed below for reference.     Microbiology: Recent Results (from the past 240 hour(s))  Microscopic Examination     Status: None   Collection Time: 04/19/16  8:31 AM  Result Value Ref Range Status   WBC, UA None seen 0 - 5 /hpf Final   RBC, UA 0-2 0 - 2 /hpf Final    Epithelial Cells (non renal) 0-10 0 - 10 /hpf Final   Renal Epithel, UA None seen None seen /hpf Final   Bacteria, UA None seen None seen/Few Final  Urine culture     Status: Abnormal   Collection Time: 04/22/16 10:43 AM  Result Value Ref Range Status   Urine Culture, Routine Final report (A)  Final   Urine Culture result 1 Escherichia coli (A)  Final    Comment: 50,000-100,000  colony forming units per mL Cefazolin <=4 ug/mL Cefazolin with an MIC <=16 predicts susceptibility to the oral agents cefaclor, cefdinir, cefpodoxime, cefprozil, cefuroxime, cephalexin, and loracarbef when used for therapy of uncomplicated urinary tract infections due to E. coli, Klebsiella pneumoniae, and Proteus mirabilis.    RESULT 2 Comment  Final    Comment: Mixed urogenital flora 10,000-25,000 colony forming units per mL    ANTIMICROBIAL SUSCEPTIBILITY Comment  Final    Comment:       ** S = Susceptible; I = Intermediate; R = Resistant **                    P = Positive; N = Negative             MICS are expressed in micrograms per mL    Antibiotic                 RSLT#1    RSLT#2    RSLT#3    RSLT#4 Amoxicillin/Clavulanic Acid    S Ampicillin                     S Cefepime                       S Ceftriaxone                    S Cefuroxime                     S Cephalothin                    I Ciprofloxacin                  S Ertapenem                      S Gentamicin                     S Imipenem                       S Levofloxacin                   S Nitrofurantoin                 S Piperacillin                   S Tetracycline                   S Tobramycin                     S Trimethoprim/Sulfa             S   Microscopic Examination     Status: Abnormal   Collection Time: 04/22/16 10:43 AM  Result Value Ref Range Status   WBC, UA 0-5 0 - 5 /hpf Final   RBC, UA 0-2 0 - 2 /hpf Final   Epithelial Cells (non renal) 0-10 0 - 10 /hpf Final   Renal Epithel, UA 0-10 (A) None seen /hpf  Final   Bacteria, UA None seen None seen/Few Final     Labs: BNP (last 3 results) No results for input(s): BNP in the last 8760 hours. Basic Metabolic Panel:  Recent Labs Lab 04/23/16 1108  04/24/16 0337 04/24/16 0629 04/24/16  1148 04/25/16 0303 04/26/16 0500  NA  --   < > 119* 122* 120* 123* 129*  K  --   < > 3.9 3.8 4.4 5.1 4.0  CL  --   < > 84* 85* 85* 91* 93*  CO2  --   < > 27 25 28 26 26   GLUCOSE  --   < > 123* 126* 89 105* 111*  BUN  --   < > 7 7 9 16 9   CREATININE  --   < > 0.50 0.51 0.58 0.60 0.50  CALCIUM  --   < > 7.8* 8.3* 8.1* 8.0* 8.4*  MG 1.6*  --   --  2.5*  --   --   --   PHOS  --   --   --   --   --   --  3.1  < > = values in this interval not displayed. Liver Function Tests:  Recent Labs Lab 04/26/16 0500  ALBUMIN 3.5   No results for input(s): LIPASE, AMYLASE in the last 168 hours. No results for input(s): AMMONIA in the last 168 hours. CBC:  Recent Labs Lab 04/23/16 0626  WBC 6.3  NEUTROABS 4.7  HGB 13.4  HCT 38.4  MCV 93.4  PLT 310   Cardiac Enzymes:  Recent Labs Lab 04/23/16 1108 04/23/16 1629 04/23/16 2236 04/24/16 0337  TROPONINI <0.03 <0.03 <0.03 <0.03   BNP: Invalid input(s): POCBNP CBG: No results for input(s): GLUCAP in the last 168 hours. D-Dimer No results for input(s): DDIMER in the last 72 hours. Hgb A1c No results for input(s): HGBA1C in the last 72 hours. Lipid Profile No results for input(s): CHOL, HDL, LDLCALC, TRIG, CHOLHDL, LDLDIRECT in the last 72 hours. Thyroid function studies  Recent Labs  04/24/16 1145  TSH 0.578   Anemia work up No results for input(s): VITAMINB12, FOLATE, FERRITIN, TIBC, IRON, RETICCTPCT in the last 72 hours. Urinalysis    Component Value Date/Time   COLORURINE YELLOW 09/28/2015 2255   APPEARANCEUR Clear 04/22/2016 1043   LABSPEC 1.010 09/28/2015 2255   PHURINE 7.5 09/28/2015 2255   GLUCOSEU Negative 04/22/2016 1043   HGBUR TRACE (A) 09/28/2015 2255   BILIRUBINUR  Negative 04/22/2016 1043   KETONESUR NEGATIVE 09/28/2015 2255   PROTEINUR Negative 04/22/2016 1043   PROTEINUR NEGATIVE 09/28/2015 2255   UROBILINOGEN 0.2 11/09/2010 1235   NITRITE Negative 04/22/2016 1043   NITRITE NEGATIVE 09/28/2015 2255   LEUKOCYTESUR Negative 04/22/2016 1043   Sepsis Labs Invalid input(s): PROCALCITONIN,  WBC,  LACTICIDVEN Microbiology Recent Results (from the past 240 hour(s))  Microscopic Examination     Status: None   Collection Time: 04/19/16  8:31 AM  Result Value Ref Range Status   WBC, UA None seen 0 - 5 /hpf Final   RBC, UA 0-2 0 - 2 /hpf Final   Epithelial Cells (non renal) 0-10 0 - 10 /hpf Final   Renal Epithel, UA None seen None seen /hpf Final   Bacteria, UA None seen None seen/Few Final  Urine culture     Status: Abnormal   Collection Time: 04/22/16 10:43 AM  Result Value Ref Range Status   Urine Culture, Routine Final report (A)  Final   Urine Culture result 1 Escherichia coli (A)  Final    Comment: 50,000-100,000 colony forming units per mL Cefazolin <=4 ug/mL Cefazolin with an MIC <=16 predicts susceptibility to the oral agents cefaclor, cefdinir, cefpodoxime, cefprozil, cefuroxime, cephalexin, and loracarbef when used for therapy of uncomplicated urinary  tract infections due to E. coli, Klebsiella pneumoniae, and Proteus mirabilis.    RESULT 2 Comment  Final    Comment: Mixed urogenital flora 10,000-25,000 colony forming units per mL    ANTIMICROBIAL SUSCEPTIBILITY Comment  Final    Comment:       ** S = Susceptible; I = Intermediate; R = Resistant **                    P = Positive; N = Negative             MICS are expressed in micrograms per mL    Antibiotic                 RSLT#1    RSLT#2    RSLT#3    RSLT#4 Amoxicillin/Clavulanic Acid    S Ampicillin                     S Cefepime                       S Ceftriaxone                    S Cefuroxime                     S Cephalothin                    I Ciprofloxacin                   S Ertapenem                      S Gentamicin                     S Imipenem                       S Levofloxacin                   S Nitrofurantoin                 S Piperacillin                   S Tetracycline                   S Tobramycin                     S Trimethoprim/Sulfa             S   Microscopic Examination     Status: Abnormal   Collection Time: 04/22/16 10:43 AM  Result Value Ref Range Status   WBC, UA 0-5 0 - 5 /hpf Final   RBC, UA 0-2 0 - 2 /hpf Final   Epithelial Cells (non renal) 0-10 0 - 10 /hpf Final   Renal Epithel, UA 0-10 (A) None seen /hpf Final   Bacteria, UA None seen None seen/Few Final     Time coordinating discharge: 45 minutes  SIGNED:   Loretha Stapler, MD  [facility] 04/26/2016, 3:12 PM Pager [phone] If 7PM-7AM, please contact night-coverage [email or web address] Password [email or web address]

## 2016-04-26 NOTE — Progress Notes (Signed)
Discharge instructions printed and reviewed with patient and spouse, and copy given for them to take home. All questions addressed at this time. New prescriptions reviewed. IV removed. Room searched for patient belongings, and confirmed with patient that all valuables were accounted for. Spouse assisted patient to dress, then staff escorted patient to discharge via wheelchair.

## 2016-04-26 NOTE — Progress Notes (Signed)
Subjective: Interval History: has no complaint of . Still she has some diarrhea but no abdominal pain..  Objective: Vital signs in last 24 hours: Temp:  [98.2 F (36.8 C)-98.7 F (37.1 C)] 98.2 F (36.8 C) (04/23 0603) Pulse Rate:  [68-77] 68 (04/23 0603) Resp:  [18] 18 (04/23 0603) BP: (158-172)/(62-72) 158/62 (04/23 0603) SpO2:  [97 %-98 %] 98 % (04/23 0603) Weight change:   Intake/Output from previous day: 04/22 0701 - 04/23 0700 In: 1312.5 [P.O.:240; I.V.:1022.5; IV Piggyback:50] Out: 250 [Urine:250] Intake/Output this shift: No intake/output data recorded.  General appearance: alert, cooperative and no distress Resp: clear to auscultation bilaterally Cardio: regular rate and rhythm Extremities: No edema  Lab Results: No results for input(s): WBC, HGB, HCT, PLT in the last 72 hours. BMET:  Recent Labs  04/25/16 0303 04/26/16 0500  NA 123* 129*  K 5.1 4.0  CL 91* 93*  CO2 26 26  GLUCOSE 105* 111*  BUN 16 9  CREATININE 0.60 0.50  CALCIUM 8.0* 8.4*   No results for input(s): PTH in the last 72 hours. Iron Studies: No results for input(s): IRON, TIBC, TRANSFERRIN, FERRITIN in the last 72 hours.  Studies/Results: Ct Head Wo Contrast  Result Date: 04/25/2016 CLINICAL DATA:  Pt states "they told me I fell this morning." Pt states the back of her head hurt prior to admission on 04/23/16. Pt is on warfarin.hx of HTN, stroke/bbj EXAM: CT HEAD WITHOUT CONTRAST TECHNIQUE: Contiguous axial images were obtained from the base of the skull through the vertex without intravenous contrast. COMPARISON:  03/16/2016 FINDINGS: Brain: No evidence of acute infarction, hemorrhage, hydrocephalus, extra-axial collection or mass lesion/mass effect. Ventricles are normal in size for age. Patchy white matter hypoattenuation is noted consistent with moderate chronic microvascular ischemic change, stable. Vascular: No hyperdense vessel or unexpected calcification. Skull: Normal. Negative for  fracture or focal lesion. Sinuses/Orbits: Visualize globes and orbits are unremarkable. Visualized sinuses and mastoid air cells are clear. Other: None. IMPRESSION: 1. No acute intracranial abnormalities. 2. Moderate chronic microvascular ischemic change. Stable appearance from the prior head CT. Electronically Signed   By: Lajean Manes M.D.   On: 04/25/2016 13:19    I have reviewed the patient's current medications.  Assessment/Plan:   Problem #1hyponatremia: Hypovolemic hyponatremia. Her sodium is 129 and improving.  Problem #2 hypokalemia; Potassium has corrected Problem #3 history of lateral fibrillation: Her heart rate is controlled Problem #4 hypertension Problem #5  History of hypothyroidism: Patient is on Synthroid Problem #6 history of diarrhea: Chronic. Plan: We'll continue his present management We'll check renal panel in the morning.   LOS: 2 days   Hildegarde Dunaway S 04/26/2016,10:20 AM

## 2016-04-28 ENCOUNTER — Encounter: Payer: Self-pay | Admitting: Family Medicine

## 2016-04-28 ENCOUNTER — Ambulatory Visit (INDEPENDENT_AMBULATORY_CARE_PROVIDER_SITE_OTHER): Payer: Medicare Other | Admitting: Family Medicine

## 2016-04-28 ENCOUNTER — Ambulatory Visit (INDEPENDENT_AMBULATORY_CARE_PROVIDER_SITE_OTHER): Payer: Medicare Other

## 2016-04-28 VITALS — BP 143/80 | HR 71 | Temp 97.4°F | Ht 63.0 in | Wt 130.8 lb

## 2016-04-28 DIAGNOSIS — R05 Cough: Secondary | ICD-10-CM

## 2016-04-28 DIAGNOSIS — R059 Cough, unspecified: Secondary | ICD-10-CM

## 2016-04-28 DIAGNOSIS — E871 Hypo-osmolality and hyponatremia: Secondary | ICD-10-CM

## 2016-04-28 NOTE — Patient Instructions (Signed)
Great to see you!  Lets have you back to follow up for low sodium in 1 week.   We will call with xray and lab results

## 2016-04-28 NOTE — Progress Notes (Signed)
   HPI  Patient presents today for hospital follow-up.  Patient was admitted to [facility] with chest pain and found to have hyponatremia to 121 as well as Escherichia coli UTI.  She was treated with Rocephin, fluid restriction, IV Lasix. She's been treated at home with Lasix plus fluid restriction.  She had a sodium of 129 before leaving the hospital.  Patient has had good compliance with Ceftin.  She continues to have cough, however no shortness of breath. Mild chills. No malaise. Tolerating foods normally.  She still does have mild to moderate throbbing headache, this did improve while she is in the hospital and has returned somewhat since getting home.  No new medicines.  PMH: Smoking status noted ROS: Per HPI  Objective: BP (!) 143/80   Pulse 71   Temp 97.4 F (36.3 C) (Oral)   Ht _0  (1.6 m)   Wt 130 lb 12.8 oz (59.3 kg)   BMI 23.17 kg/m  Gen: NAD, alert, cooperative with exam HEENT: NCAT, oropharynx moist and clear, nares with mild swelling of the mucosa CV: RRR, good S1/S2, no murmur Resp: CTABL, no wheezes, non-labored Ext: No edema, warm Neuro: Alert and oriented, No gross deficits  Assessment and plan:  # Hyponatremia Repeat labs, with her mild to moderate persistent headache that improved while she was hospitalized on concern for recurrent hyponatremia. Continue fluid restriction and Lasix.  # Cough Lung exam reassuring, likely to be pneumonia, however with persisting cough after having atelectasis in the hospital I think follow-up chest x-rays appropriate.  Orders Placed This Encounter  Procedures  . DG Chest 2 View    Standing Status:   Future    Standing Expiration Date:   06/28/2017    Order Specific Question:   Reason for Exam (SYMPTOM  OR DIAGNOSIS REQUIRED)    Answer:   Cough, atelectasis in hosp recently.    Order Specific Question:   Preferred imaging location?    Answer:   Internal    Order Specific Question:   Radiology  Contrast Protocol - do NOT remove file path    Answer:   [email or web address]  . BMP8+EGFR  . CBC with [place], MD [facility] 04/28/2016, 1:57 PM

## 2016-04-29 ENCOUNTER — Ambulatory Visit (INDEPENDENT_AMBULATORY_CARE_PROVIDER_SITE_OTHER): Payer: Medicare Other | Admitting: Pharmacist

## 2016-04-29 DIAGNOSIS — I480 Paroxysmal atrial fibrillation: Secondary | ICD-10-CM | POA: Diagnosis not present

## 2016-04-29 LAB — BMP8+EGFR
BUN / CREAT RATIO: 15 (ref 12–28)
BUN: 9 mg/dL (ref 8–27)
CHLORIDE: 93 mmol/L — AB (ref 96–106)
CO2: 24 mmol/L (ref 18–29)
Calcium: 8.7 mg/dL (ref 8.7–10.3)
Creatinine, Ser: 0.61 mg/dL (ref 0.57–1.00)
GFR calc Af Amer: 98 mL/min/{1.73_m2} (ref >59)
GFR calc non Af Amer: 85 mL/min/{1.73_m2} (ref >59)
GLUCOSE: 100 mg/dL — AB (ref 65–99)
Potassium: 4.5 mmol/L (ref 3.5–5.2)
Sodium: 133 mmol/L — ABNORMAL LOW (ref 134–144)

## 2016-04-29 LAB — CBC WITH DIFFERENTIAL/PLATELET
BASOS: 1 %
Basophils Absolute: 0 10*3/uL (ref 0.0–0.2)
EOS (ABSOLUTE): 0.1 10*3/uL (ref 0.0–0.4)
EOS: 3 %
HEMOGLOBIN: 12.6 g/dL (ref 11.1–15.9)
Hematocrit: 36.5 % (ref 34.0–46.6)
IMMATURE GRANULOCYTES: 0 %
Immature Grans (Abs): 0 10*3/uL (ref 0.0–0.1)
LYMPHS ABS: 1.1 10*3/uL (ref 0.7–3.1)
Lymphs: 25 %
MCH: 32.1 pg (ref 26.6–33.0)
MCHC: 34.5 g/dL (ref 31.5–35.7)
MCV: 93 fL (ref 79–97)
MONOCYTES: 16 %
MONOS ABS: 0.7 10*3/uL (ref 0.1–0.9)
Neutrophils Absolute: 2.6 10*3/uL (ref 1.4–7.0)
Neutrophils: 55 %
PLATELETS: 287 10*3/uL (ref 150–379)
RBC: 3.93 x10E6/uL (ref 3.77–5.28)
RDW: 13.2 % (ref 12.3–15.4)
WBC: 4.6 10*3/uL (ref 3.4–10.8)

## 2016-04-29 LAB — COAGUCHEK XS/INR WAIVED
INR: 1.3 — AB (ref 0.9–1.1)
Prothrombin Time: 15.7 s

## 2016-04-29 NOTE — Patient Instructions (Signed)
Anticoagulation Dose Instructions as of 04/29/2016      [name] Tue Wed Thu Fri Sat        4/26  7.5 mg  1 and 1/2 tablets 4/27  5 mg  1 tablet 4/28  5 mg  1 tablet   Alt Week 4/29  5 mg  1 tablet 4/30  2.5 mg  1/2 tablet  5/1  5 mg  1 tablet  5/2  5 mg  1 tablet 5/3  5 mg  1 tablet 5/4  2.5 mg  1/2 tablet 5/5  5 mg  1 tablet    Description   Take 1 and 1/2 tablets today - Thursday, April 26th.  Take 1 tablet tomorrow - Friday, April 27th.  Then restart usual warfarin 5mg  dose - take 1/2 tablet on Mondays and Fridays. Take 1 tablet all other days   INR was 1.3 today (too thick)

## 2016-05-05 ENCOUNTER — Other Ambulatory Visit: Payer: Self-pay | Admitting: Family Medicine

## 2016-05-05 ENCOUNTER — Ambulatory Visit (INDEPENDENT_AMBULATORY_CARE_PROVIDER_SITE_OTHER): Payer: Medicare Other | Admitting: Pharmacist

## 2016-05-05 DIAGNOSIS — I480 Paroxysmal atrial fibrillation: Secondary | ICD-10-CM | POA: Diagnosis not present

## 2016-05-05 LAB — COAGUCHEK XS/INR WAIVED
INR: 2.5 — ABNORMAL HIGH (ref 0.9–1.1)
Prothrombin Time: 29.8 s

## 2016-05-06 ENCOUNTER — Encounter: Payer: Self-pay | Admitting: Pediatrics

## 2016-05-06 ENCOUNTER — Ambulatory Visit (INDEPENDENT_AMBULATORY_CARE_PROVIDER_SITE_OTHER): Payer: Medicare Other | Admitting: Pediatrics

## 2016-05-06 VITALS — BP 144/65 | HR 57 | Temp 97.6°F | Ht 63.0 in | Wt 128.4 lb

## 2016-05-06 DIAGNOSIS — G4700 Insomnia, unspecified: Secondary | ICD-10-CM

## 2016-05-06 DIAGNOSIS — E871 Hypo-osmolality and hyponatremia: Secondary | ICD-10-CM

## 2016-05-06 DIAGNOSIS — I480 Paroxysmal atrial fibrillation: Secondary | ICD-10-CM | POA: Diagnosis not present

## 2016-05-06 DIAGNOSIS — I10 Essential (primary) hypertension: Secondary | ICD-10-CM | POA: Diagnosis not present

## 2016-05-06 NOTE — Patient Instructions (Signed)
  Work on sleep hygiene changes as well, no TV after 8 pm, no naps during the day, no TV, cell phone or tablet in the bedroom. Bedroom for sleep. If not falling asleep within 15-20 minutes should get up and do something that makes you sleepy, like reading. No TV. Set alarm every morning for the same time, get up every morning same time whether you feel like you got sleep the night before or not so you are retraining yourself to sleep at the right times.   Can try melatonin 3-6mg  30 min before you lie down

## 2016-05-06 NOTE — Progress Notes (Signed)
  Subjective:   Patient ID: [name], female    DOB: 01-04-1933, 83 y.o.   MRN: [id number] CC: Fatigue (3 weeks) and Mouth Lesions  HPI: [name] is a 83 y.o. female presenting for Fatigue (3 weeks) and Mouth Lesions  Has felt tired since being admitted with UTI and hyponatremia last week Had f/u with PCP, Na improved from discharge 129 to 133  Says she has been eating three meals a day, appetite has been   Hard time falling asleep, goes to bed 9pm, sleeps at 11pm, gets up to use bathroom and able to go back to sleep Gets up at 7-8am  No heart palpitations No fevers  Relevant past medical, surgical, family and social history reviewed. Allergies and medications reviewed and updated. History  Smoking Status  . Never Smoker  Smokeless Tobacco  . Never Used   ROS: Per HPI   Objective:    BP (!) 144/65   Pulse (!) 57   Temp 97.6 F (36.4 C) (Oral)   Ht '5\' 3"'$  (1.6 m)   Wt 128 lb 6.4 oz (58.2 kg)   BMI 22.75 kg/m   Wt Readings from Last 3 Encounters:  05/06/16 128 lb 6.4 oz (58.2 kg)  04/28/16 130 lb 12.8 oz (59.3 kg)  04/23/16 127 lb 3.3 oz (57.7 kg)    Gen: NAD, alert, cooperative with exam, NCAT EYES: EOMI, no conjunctival injection, or no icterus ENT:  TMs pearly gray b/l, OP without erythema CV: NRRR, normal S1/S2 Resp: CTABL, no wheezes, normal WOB Abd: +BS, soft, NTND. no guarding or organomegaly Ext: No edema, warm Neuro: Alert and oriented  Assessment & Plan:  [name] was seen today for fatigue and mouth lesions.  Diagnoses and all orders for this visit:  Fatigue  Recent admission for UTI, also found to have hyponatremia, hypoNa improving last visit Now with fluid restriction, continuing on lasix Fatigue likely multifactorial, cont regular walking at home as able  Hyponatremia Recheck Na level -     BMP8+EGFR  Insomnia, unspecified type Discussed sleep hygiene  Paroxysmal atrial fibrillation ([place]) INR checked yesterday, within goal  range  Essential hypertension Adequate contorl, cont meds  Follow up plan: Return in about 2 weeks (around 05/20/2016) for follow up. Assunta Found, MD [place]

## 2016-05-07 LAB — BMP8+EGFR
BUN / CREAT RATIO: 21 (ref 12–28)
BUN: 14 mg/dL (ref 8–27)
CALCIUM: 9.3 mg/dL (ref 8.7–10.3)
CO2: 26 mmol/L (ref 18–29)
Chloride: 90 mmol/L — ABNORMAL LOW (ref 96–106)
Creatinine, Ser: 0.68 mg/dL (ref 0.57–1.00)
GFR calc Af Amer: 94 mL/min/{1.73_m2} (ref >59)
GFR calc non Af Amer: 82 mL/min/{1.73_m2} (ref >59)
Glucose: 101 mg/dL — ABNORMAL HIGH (ref 65–99)
Potassium: 4.2 mmol/L (ref 3.5–5.2)
Sodium: 135 mmol/L (ref 134–144)

## 2016-05-10 ENCOUNTER — Ambulatory Visit (INDEPENDENT_AMBULATORY_CARE_PROVIDER_SITE_OTHER): Payer: Medicare Other | Admitting: Physician Assistant

## 2016-05-10 ENCOUNTER — Encounter: Payer: Self-pay | Admitting: Physician Assistant

## 2016-05-10 ENCOUNTER — Other Ambulatory Visit: Payer: Self-pay | Admitting: Family Medicine

## 2016-05-10 VITALS — BP 150/81 | HR 71 | Temp 97.2°F | Ht 63.0 in | Wt 129.6 lb

## 2016-05-10 DIAGNOSIS — N3001 Acute cystitis with hematuria: Secondary | ICD-10-CM | POA: Insufficient documentation

## 2016-05-10 DIAGNOSIS — R350 Frequency of micturition: Secondary | ICD-10-CM | POA: Diagnosis not present

## 2016-05-10 LAB — URINALYSIS, COMPLETE
BILIRUBIN UA: NEGATIVE
Glucose, UA: NEGATIVE
Ketones, UA: NEGATIVE
Leukocytes, UA: NEGATIVE
NITRITE UA: NEGATIVE
Protein, UA: NEGATIVE
Specific Gravity, UA: 1.015 (ref 1.005–1.030)
Urobilinogen, Ur: 0.2 mg/dL (ref 0.2–1.0)
pH, UA: 7 (ref 5.0–7.5)

## 2016-05-10 LAB — MICROSCOPIC EXAMINATION
Bacteria, UA: NONE SEEN
Renal Epithel, UA: NONE SEEN /hpf

## 2016-05-10 MED ORDER — SULFAMETHOXAZOLE-TRIMETHOPRIM 800-160 MG PO TABS: 1.0000 | ORAL_TABLET | Freq: Two times a day (BID) | ORAL | 0 refills | Status: DC

## 2016-05-10 MED ORDER — TRAMADOL HCL 50 MG PO TABS: 25.0000 mg | ORAL_TABLET | Freq: Three times a day (TID) | ORAL | 0 refills | Status: DC | PRN

## 2016-05-10 MED ORDER — OXYBUTYNIN CHLORIDE 5 MG PO TABS: 5.0000 mg | ORAL_TABLET | Freq: Three times a day (TID) | ORAL | 0 refills | Status: DC

## 2016-05-10 NOTE — Patient Instructions (Signed)

## 2016-05-10 NOTE — Progress Notes (Signed)
BP (!) 150/81   Pulse 71   Temp 97.2 F (36.2 C) (Oral)   Ht 5\' 3"  (1.6 m)   Wt 129 lb 9.6 oz (58.8 kg)   BMI 22.96 kg/m    Subjective:    Patient ID: [name], female    DOB: 1932-05-18, 83 y.o.   MRN: [id number]  HPI: [name] is a 83 y.o. female presenting on 05/10/2016 for Urinary Tract Infection  This patient has had several days of dysuria, frequency and nocturia. There is also pain over the bladder in the suprapubic region, no back pain. Denies leakage or hematuria.  Denies fever or chills. No pain in flank area.  Relevant past medical, surgical, family and social history reviewed and updated as indicated. Allergies and medications reviewed and updated.  Past Medical History:  Diagnosis Date  . Anemia   . Atrial fibrillation (HCC)    Paroxysmal, Flecainide therapy  . Calf pain    September, 2012, at rest  . Cardiomegaly    EF normal, echo, 2003  //   EF 60-65%, echo, June 21, 3544, diastolic dysfunction,  . Carotid bruit    Doppler, December, 2009, no abnormality  . Cataract   . Diverticulosis   . GERD (gastroesophageal reflux disease)   . History of shingles   . HLD (hyperlipidemia)   . HTN (hypertension)   . Hypothyroidism   . Hypothyroidism   . Insomnia   . Normal nuclear stress test    Normal, 2001  . Osteoarthritis   . PONV (postoperative nausea and vomiting)   . Rectal bleeding 2001   diverticulosis and int hemorrhoids on 07/1999 and 02/2010 colonoscopies.  . Stroke ([place])   . Warfarin anticoagulation       Review of Systems  Constitutional: Negative.   HENT: Negative.   Eyes: Negative.   Respiratory: Negative.   Gastrointestinal: Negative.   Genitourinary: Positive for difficulty urinating, dysuria and urgency. Negative for flank pain.    Allergies as of 05/10/2016      Reactions   Penicillins Rash, Other (See Comments)   Has patient had a PCN reaction causing immediate rash, facial/tongue/throat swelling, SOB or lightheadedness  with hypotension: No Has patient had a PCN reaction causing severe rash involving mucus membranes or skin necrosis: No Has patient had a PCN reaction that required hospitalization No Has patient had a PCN reaction occurring within the last 10 years: No If all of the above answers are "NO", then may proceed with Cephalosporin use.      Medication List       Accurate as of 05/10/16 10:58 AM. Always use your most recent med list.          atenolol 25 MG tablet Commonly known as:  TENORMIN Take 25 mg by mouth daily.   atorvastatin 20 MG tablet Commonly known as:  LIPITOR Take 0.5 tablets (10 mg total) by mouth at bedtime.   furosemide 40 MG tablet Commonly known as:  LASIX Take 20 mg by mouth daily.   levETIRAcetam 500 MG tablet Commonly known as:  KEPPRA Take 250 mg by mouth 2 (two) times daily.   levothyroxine 50 MCG tablet Commonly known as:  SYNTHROID, LEVOTHROID Take 1 tablet daily before breakfast. Must beseen before next refill   lisinopril 10 MG tablet Commonly known as:  PRINIVIL,ZESTRIL Take 10 mg by mouth daily.   oxybutynin 5 MG tablet Commonly known as:  DITROPAN Take 1 tablet (5 mg total) by mouth 3 (three)  times daily.   potassium chloride SA 20 MEQ tablet Commonly known as:  K-DUR,KLOR-CON Take 20 mEq by mouth daily with breakfast.   sulfamethoxazole-trimethoprim 800-160 MG tablet Commonly known as:  BACTRIM DS,SEPTRA DS Take 1 tablet by mouth 2 (two) times daily.   traMADol 50 MG tablet Commonly known as:  ULTRAM Take 0.5-1 tablets (25-50 mg total) by mouth every 8 (eight) hours as needed.   warfarin 5 MG tablet Commonly known as:  COUMADIN Take 1/2 tablet on mondays and fridays. Take 1 tablet all other days.          Objective:    BP (!) 150/81   Pulse 71   Temp 97.2 F (36.2 C) (Oral)   Ht 5\' 3"  (1.6 m)   Wt 129 lb 9.6 oz (58.8 kg)   BMI 22.96 kg/m   Allergies  Allergen Reactions  . Penicillins Rash and Other (See Comments)     Has patient had a PCN reaction causing immediate rash, facial/tongue/throat swelling, SOB or lightheadedness with hypotension: No Has patient had a PCN reaction causing severe rash involving mucus membranes or skin necrosis: No Has patient had a PCN reaction that required hospitalization No Has patient had a PCN reaction occurring within the last 10 years: No If all of the above answers are "NO", then may proceed with Cephalosporin use.    Physical Exam  Constitutional: She is oriented to person, place, and time. She appears well-developed and well-nourished.  HENT:  Head: Normocephalic and atraumatic.  Eyes: Conjunctivae are normal. Pupils are equal, round, and reactive to light.  Cardiovascular: Normal rate, regular rhythm, normal heart sounds and intact distal pulses.   Pulmonary/Chest: Effort normal and breath sounds normal.  Abdominal: Soft. Bowel sounds are normal. She exhibits no distension and no mass. There is tenderness in the suprapubic area. There is no rebound, no guarding and no CVA tenderness.  Neurological: She is alert and oriented to person, place, and time. She has normal reflexes.  Skin: Skin is warm and dry. No rash noted.  Psychiatric: She has a normal mood and affect. Her behavior is normal. Judgment and thought content normal.        Assessment & Plan:   1. Frequent urination - Urine culture - Urinalysis, Complete - oxybutynin (DITROPAN) 5 MG tablet; Take 1 tablet (5 mg total) by mouth 3 (three) times daily.  Dispense: 90 tablet; Refill: 0  2. Acute cystitis with hematuria - sulfamethoxazole-trimethoprim (BACTRIM DS,SEPTRA DS) 800-160 MG tablet; Take 1 tablet by mouth 2 (two) times daily.  Dispense: 20 tablet; Refill: 0   Continue all other maintenance medications as listed above.  Follow up plan: Return if symptoms worsen or fail to improve.  Educational handout given for UTI  Terald Sleeper PA-C [facility] [address]  [place], [place] [address] [phone]   05/10/2016, 10:58 AM

## 2016-05-11 LAB — URINE CULTURE: Organism ID, Bacteria: NO GROWTH

## 2016-05-18 ENCOUNTER — Ambulatory Visit: Payer: Self-pay | Admitting: Family Medicine

## 2016-05-20 ENCOUNTER — Encounter: Payer: Self-pay | Admitting: Pharmacist

## 2016-05-20 ENCOUNTER — Ambulatory Visit (INDEPENDENT_AMBULATORY_CARE_PROVIDER_SITE_OTHER): Payer: Medicare Other | Admitting: Pharmacist

## 2016-05-20 DIAGNOSIS — I480 Paroxysmal atrial fibrillation: Secondary | ICD-10-CM | POA: Diagnosis not present

## 2016-05-20 LAB — COAGUCHEK XS/INR WAIVED
INR: 2.1 — ABNORMAL HIGH (ref 0.9–1.1)
Prothrombin Time: 24.9 s

## 2016-05-24 ENCOUNTER — Encounter: Payer: Self-pay | Admitting: Pediatrics

## 2016-05-24 ENCOUNTER — Ambulatory Visit (INDEPENDENT_AMBULATORY_CARE_PROVIDER_SITE_OTHER): Payer: Medicare Other | Admitting: Pediatrics

## 2016-05-24 VITALS — BP 178/80 | HR 64 | Temp 98.3°F | Ht 63.0 in | Wt 128.6 lb

## 2016-05-24 DIAGNOSIS — I10 Essential (primary) hypertension: Secondary | ICD-10-CM | POA: Diagnosis not present

## 2016-05-24 DIAGNOSIS — J309 Allergic rhinitis, unspecified: Secondary | ICD-10-CM | POA: Diagnosis not present

## 2016-05-24 DIAGNOSIS — R682 Dry mouth, unspecified: Secondary | ICD-10-CM | POA: Diagnosis not present

## 2016-05-24 DIAGNOSIS — R109 Unspecified abdominal pain: Secondary | ICD-10-CM

## 2016-05-24 DIAGNOSIS — R339 Retention of urine, unspecified: Secondary | ICD-10-CM

## 2016-05-24 LAB — URINALYSIS, COMPLETE
Bilirubin, UA: NEGATIVE
Glucose, UA: NEGATIVE
LEUKOCYTES UA: NEGATIVE
NITRITE UA: NEGATIVE
Protein, UA: NEGATIVE
SPEC GRAV UA: 1.015 (ref 1.005–1.030)
Urobilinogen, Ur: 0.2 mg/dL (ref 0.2–1.0)
pH, UA: 7 (ref 5.0–7.5)

## 2016-05-24 LAB — MICROSCOPIC EXAMINATION: Renal Epithel, UA: NONE SEEN /hpf

## 2016-05-24 NOTE — Patient Instructions (Signed)
Stop bactrim, stop oxybutynin  Follow up with Dr Wendi Snipes in 2 weeks

## 2016-05-24 NOTE — Progress Notes (Signed)
  Subjective:   Patient ID: [name], female    DOB: 1932-04-18, 83 y.o.   MRN: [id number] CC: Cough and Trouble Urinating  HPI: [name] is a 83 y.o. female presenting for Cough and Trouble Urinating  Here today with her husband [name] for past few weeks has aching pain in lower abd, at times feels like she has to strain to urinate.  Not feeling her normal self Does not feel like she has an enlarged bladder, never had any tightness in lower abd that she is unable to empty  Cough has been ongoing, feels very dry since starting oxybutynin 2 weeks ago At night will stay awake coughing   No fevers Appetite is down Has been on bactrim for UTI for past week, has a couple days of abx left Had positive UA in clinic 2 weeks ago, but no growth on culture  HTN: no CP  Ears have been itching, has been sneezing more  Relevant past medical, surgical, family and social history reviewed. Allergies and medications reviewed and updated. History  Smoking Status  . Never Smoker  Smokeless Tobacco  . Never Used   ROS: Per HPI   Objective:    BP (!) 178/80   Pulse 64   Temp 98.3 F (36.8 C) (Oral)   Ht 5\' 3"  (1.6 m)   Wt 128 lb 9.6 oz (58.3 kg)   BMI 22.78 kg/m   Wt Readings from Last 3 Encounters:  05/24/16 128 lb 9.6 oz (58.3 kg)  05/10/16 129 lb 9.6 oz (58.8 kg)  05/06/16 128 lb 6.4 oz (58.2 kg)    Gen: NAD, alert, cooperative with exam, NCAT EYES: EOMI, no conjunctival injection, or no icterus ENT:  TMs pearly gray b/l, OP without erythema LYMPH: no cervical LAD  CV: NRRR, normal S1/S2, no murmur Resp: CTABL, no wheezes, normal WOB Abd: +BS, soft, NTND. no guarding or organomegaly, no masses, no rebound Ext: No edema, warm Neuro: Alert and oriented, strength equal b/l UE and LE, coordination grossly normal MSK: normal muscle bulk  Assessment & Plan:  Dorianna was seen today for cough and trouble urinating.  Diagnoses and all orders for this visit:  Urinary  retention Urinary frequency Increased symptoms past week, recently started on oxybutynin Recheck urine -     Urinalysis, Complete -     Urine culture  Essential hypertension Elevated systolic today, asymptomatic pt says has been taking medications Will check at home  Dry mouth Likely due to oxybut. Will stop No h/o incontinence  Abdominal discomfort Normal exam, no bladder palpable today Try stopping oxybutynin and bactrim, urine culture was negative If symptoms do not improve shortly will refer to urology F/u with PCP in 2 weeks, sooner if needed Discussed return precautions  Allergic rhinitis, unspecified seasonality, unspecified trigger flonase as needed for sneezing  Follow up plan: Return in about 2 weeks (around 06/07/2016). Assunta Found, MD [place]

## 2016-05-25 LAB — URINE CULTURE

## 2016-05-26 ENCOUNTER — Encounter: Payer: Self-pay | Admitting: Family Medicine

## 2016-05-26 ENCOUNTER — Ambulatory Visit (INDEPENDENT_AMBULATORY_CARE_PROVIDER_SITE_OTHER): Payer: Medicare Other | Admitting: Family Medicine

## 2016-05-26 VITALS — BP 154/82 | HR 66 | Temp 97.1°F | Ht 63.0 in | Wt 125.6 lb

## 2016-05-26 DIAGNOSIS — I10 Essential (primary) hypertension: Secondary | ICD-10-CM | POA: Diagnosis not present

## 2016-05-26 DIAGNOSIS — E871 Hypo-osmolality and hyponatremia: Secondary | ICD-10-CM

## 2016-05-26 DIAGNOSIS — F32A Depression, unspecified: Secondary | ICD-10-CM

## 2016-05-26 DIAGNOSIS — F329 Major depressive disorder, single episode, unspecified: Secondary | ICD-10-CM

## 2016-05-26 DIAGNOSIS — K219 Gastro-esophageal reflux disease without esophagitis: Secondary | ICD-10-CM

## 2016-05-26 MED ORDER — MIRTAZAPINE 15 MG PO TABS: 15.0000 mg | ORAL_TABLET | Freq: Every day | ORAL | 3 refills | Status: DC

## 2016-05-26 MED ORDER — PANTOPRAZOLE SODIUM 40 MG PO TBEC: 40.0000 mg | DELAYED_RELEASE_TABLET | Freq: Every day | ORAL | 3 refills | Status: DC

## 2016-05-26 NOTE — Progress Notes (Signed)
   HPI  Patient presents today here to discuss loss of appetite, depression, and cough.  Patient has had worsening cough over the last month, she also notes that she's had severe heartburn. She has a list daily symptoms. Her husband states that she wakes up a lot at night coughing and often feels like she is going to throw up and like some things coming up. she is taking lots of times with minimal improvement.  Depression Patient does feel sad and down. Denies any suicidal thoughts but does endorse feelings of being better off dead.  She sleeps okay, her appetite is poor.  Hypertension Good medication compliance No chest pain, no dyspnea  PMH: Smoking status noted ROS: Per HPI  Objective: BP (!) 154/82   Pulse 66   Temp 97.1 F (36.2 C) (Oral)   Ht _0  (1.6 m)   Wt 125 lb 9.6 oz (57 kg)   BMI 22.25 kg/m  Gen: NAD, alert, cooperative with exam HEENT: NCAT, nares clear, TMs normal bilaterally CV: RRR, good S1/S2, no murmur Resp: CTABL, no wheezes, non-labored Ext: No edema, warm Neuro: Alert and oriented, No gross deficits  Depression screen [facility] 2/9 05/26/2016 05/24/2016 05/10/2016 05/06/2016 04/28/2016  Decreased Interest _1 0  Down, Depressed, Hopeless _2 0  PHQ - 2 Score _3 0  Altered sleeping _4 -  Tired, decreased energy _5 -  Change in appetite _6 -  Feeling bad or failure about yourself  0 0 0 0 -  Trouble concentrating 2 0 0 0 -  Moving slowly or fidgety/restless 3 0 0 0 -  Suicidal thoughts 1 0 0 0 -  PHQ-9 Score _7 -  Difficult doing work/chores Not difficult at all Very difficult - Somewhat difficult -  Some recent data might be hidden     Assessment and plan:  # Depression Worsening, also loss of appetite and some disturbance. Start Remeron Refer to [facility] for phone follow-up  # Hypertension Improved over recent check, no change in medication With cough consider changing ACE inhibitor to ARB.  # GERD Patient with  irritating cough today, described as similar to GERD related cough. Start PPI  # Hyponatremia Improving steadily, repeat labs, starting new medication so I'll have her back in 3 weeks to repeat labs and check up again.    Orders Placed This Encounter  Procedures  . BMP8+EGFR    Meds ordered this encounter  Medications  . mirtazapine (REMERON) 15 MG tablet    Sig: Take 1 tablet (15 mg total) by mouth at bedtime.    Dispense:  30 tablet    Refill:  3  . pantoprazole (PROTONIX) 40 MG tablet    Sig: Take 1 tablet (40 mg total) by mouth daily.    Dispense:  30 tablet    Refill:  Ford, MD [place] 05/26/2016, 11:11 AM

## 2016-05-26 NOTE — Patient Instructions (Signed)
Great to see you!  Start mirtazepine 1 pill once daily at night  Also start pantoprazole for heartburn.   Come back in 3 weeks.

## 2016-05-27 LAB — BMP8+EGFR
BUN/Creatinine Ratio: 10 — ABNORMAL LOW (ref 12–28)
BUN: 8 mg/dL (ref 8–27)
CALCIUM: 8.5 mg/dL — AB (ref 8.7–10.3)
CO2: 20 mmol/L (ref 18–29)
CREATININE: 0.82 mg/dL (ref 0.57–1.00)
Chloride: 83 mmol/L — ABNORMAL LOW (ref 96–106)
GFR, EST AFRICAN AMERICAN: 77 mL/min/{1.73_m2} (ref >59)
GFR, EST NON AFRICAN AMERICAN: 67 mL/min/{1.73_m2} (ref >59)
Glucose: 116 mg/dL — ABNORMAL HIGH (ref 65–99)
POTASSIUM: 4.5 mmol/L (ref 3.5–5.2)
Sodium: 122 mmol/L — ABNORMAL LOW (ref 134–144)

## 2016-05-27 LAB — TSH: TSH: 0.776 u[IU]/mL (ref 0.450–4.500)

## 2016-06-01 ENCOUNTER — Other Ambulatory Visit: Payer: Self-pay | Admitting: *Deleted

## 2016-06-01 ENCOUNTER — Telehealth: Payer: Self-pay | Admitting: Family Medicine

## 2016-06-01 ENCOUNTER — Other Ambulatory Visit: Payer: Medicare Other

## 2016-06-01 DIAGNOSIS — E871 Hypo-osmolality and hyponatremia: Secondary | ICD-10-CM | POA: Diagnosis not present

## 2016-06-01 DIAGNOSIS — I480 Paroxysmal atrial fibrillation: Secondary | ICD-10-CM

## 2016-06-01 NOTE — Telephone Encounter (Signed)
Is this something that you have discussed with her. I spoke with her this morning about her low sodium ( she is rechecking today) and she had no complaints.Marland Kitchen

## 2016-06-01 NOTE — Telephone Encounter (Signed)
What type of referral do you need? Dr Percival Spanish  Have you been seen at our office for this problem? yes (If no, schedule them an appointment.  They will need to be seen before a referral can be done.)  Is there a particular doctor or location that you prefer? Dr Percival Spanish. Needs appt for today since spouse has appt with the dr tomorrow. He will be next door tomorrow. Please call when scheduled for tomorrow if possible.  Patient notified that referrals can take up to a week or longer to process. If they haven't heard anything within a week they should call back and speak with the referral department.

## 2016-06-01 NOTE — Telephone Encounter (Signed)
Referral placed, pt will need to call his office to see if they have any openings tomorrow.   Laroy Apple, MD [facility] 06/01/2016, 9:51 AM

## 2016-06-02 ENCOUNTER — Ambulatory Visit (INDEPENDENT_AMBULATORY_CARE_PROVIDER_SITE_OTHER): Payer: Medicare Other | Admitting: Cardiology

## 2016-06-02 ENCOUNTER — Encounter: Payer: Self-pay | Admitting: Cardiology

## 2016-06-02 VITALS — BP 150/80 | HR 72 | Ht 62.0 in | Wt 127.0 lb

## 2016-06-02 DIAGNOSIS — R079 Chest pain, unspecified: Secondary | ICD-10-CM | POA: Diagnosis not present

## 2016-06-02 LAB — BMP8+EGFR
BUN / CREAT RATIO: 14 (ref 12–28)
BUN: 9 mg/dL (ref 8–27)
CHLORIDE: 98 mmol/L (ref 96–106)
CO2: 30 mmol/L — ABNORMAL HIGH (ref 18–29)
Calcium: 8.5 mg/dL — ABNORMAL LOW (ref 8.7–10.3)
Creatinine, Ser: 0.66 mg/dL (ref 0.57–1.00)
GFR calc Af Amer: 95 mL/min/{1.73_m2} (ref >59)
GFR calc non Af Amer: 83 mL/min/{1.73_m2} (ref >59)
Glucose: 117 mg/dL — ABNORMAL HIGH (ref 65–99)
POTASSIUM: 4.1 mmol/L (ref 3.5–5.2)
Sodium: 140 mmol/L (ref 134–144)

## 2016-06-02 MED ORDER — FLECAINIDE ACETATE 100 MG PO TABS: 50.0000 mg | ORAL_TABLET | Freq: Two times a day (BID) | ORAL | Status: DC

## 2016-06-02 NOTE — Patient Instructions (Addendum)
Medication Instructions:  The current medical regimen is effective;  continue present plan and medications.  Testing/Procedures: Your physician has requested that you have a lexiscan myoview. Please follow instruction sheet, as given.  Follow-Up: Follow up in 1 year with Dr. Percival Spanish.  You will receive a letter in the mail 2 months before you are due.  Please call us when you receive this letter to schedule your follow up appointment.  If you need a refill on your cardiac medications before your next appointment, please call your pharmacy.  Thank you for choosing [facility]!!

## 2016-06-02 NOTE — Progress Notes (Signed)
HPI The patient has a history of atrial fibrillation and premature contractions.   She was in the hospital in April.  I reviewed these records for this visit.    She had atypical chest pain.  She did have LBBB and was in atrial flutter.  Because of the new LBBB her flecainide was discontinued.  She did have an echocardiogram.  She did have grade II diastolic dysfunction.  Otherwise there were no significant abnormalities.  Since going home she's continued to have some chest discomfort. This seems to be episodic. It's a burning discomfort it happened once since discharge. She said it starts in her mid sternum and goes through to her back. It waxes and wanes in intensity. She might get mildly short of breath. She'll get dizzy with this. She might have some palpitations although she's not clear on this sequence. She doesn't remember the events of her hospitalization at all. She is talking about having some short-term memory this   problems. She didn't bring her medicines originally and we had to send her back home to get these.  It took several tries but we finally corrected the list as above.  Of most importance she has restarted her Flecainide on her own 1/2 tablet daily.  This had been stopped pending a work up of chest pain.     Allergies  Allergen Reactions  . Penicillins Rash and Other (See Comments)    Has patient had a PCN reaction causing immediate rash, facial/tongue/throat swelling, SOB or lightheadedness with hypotension: No Has patient had a PCN reaction causing severe rash involving mucus membranes or skin necrosis: No Has patient had a PCN reaction that required hospitalization No Has patient had a PCN reaction occurring within the last 10 years: No If all of the above answers are "NO", then may proceed with Cephalosporin use.    Current Outpatient Prescriptions  Medication Sig Dispense Refill  . atenolol (TENORMIN) 50 MG tablet Take 50 mg by mouth daily.    Marland Kitchen atorvastatin  (LIPITOR) 20 MG tablet Take 20 mg by mouth daily.    . flecainide (TAMBOCOR) 100 MG tablet Take 100 mg by mouth 2 (two) times daily.    . furosemide (LASIX) 40 MG tablet Take 20 mg by mouth daily. Take 1/2 tablet by mouth in the morning and take one tablet by mouth if swelling occurs    . levETIRAcetam (KEPPRA) 500 MG tablet Take 250 mg by mouth 2 (two) times daily.    Marland Kitchen lisinopril (PRINIVIL,ZESTRIL) 10 MG tablet Take 10 mg by mouth daily.    . pantoprazole (PROTONIX) 40 MG tablet Take 1 tablet (40 mg total) by mouth daily. 30 tablet 3  . traMADol (ULTRAM) 50 MG tablet Take 0.5-1 tablets (25-50 mg total) by mouth every 8 (eight) hours as needed. 30 tablet 0  . warfarin (COUMADIN) 5 MG tablet Take 1/2 tablet on mondays and fridays. Take 1 tablet all other days. 85 tablet 1  . zolpidem (AMBIEN) 5 MG tablet Take 2.5 to 5mg  by mouth as needed for insomnia     No current facility-administered medications for this visit.     Past Medical History:  Diagnosis Date  . Anemia   . Atrial fibrillation (HCC)    Paroxysmal, Flecainide therapy  . Calf pain    September, 2012, at rest  . Cardiomegaly    EF normal, echo, 2003  //   EF 60-65%, echo, June 21, 6501, diastolic dysfunction,  . Carotid bruit  Doppler, December, 2009, no abnormality  . Cataract   . Diverticulosis   . GERD (gastroesophageal reflux disease)   . History of shingles   . HLD (hyperlipidemia)   . HTN (hypertension)   . Hypothyroidism   . Hypothyroidism   . Insomnia   . Normal nuclear stress test    Normal, 2001  . Osteoarthritis   . PONV (postoperative nausea and vomiting)   . Rectal bleeding 2001   diverticulosis and int hemorrhoids on 07/1999 and 02/2010 colonoscopies.  . Stroke ([place])   . Warfarin anticoagulation     Past Surgical History:  Procedure Laterality Date  . BIOPSY  09/29/2015   Procedure: BIOPSY;  Surgeon: Rogene Houston, MD;  Location: [facility];  Service: Endoscopy;;  gastric  . CATARACT  EXTRACTION W/PHACO Right 04/15/2014   Procedure: CATARACT EXTRACTION PHACO AND INTRAOCULAR LENS PLACEMENT (IOC);  Surgeon: Tonny Branch, MD;  Location: [facility];  Service: Ophthalmology;  Laterality: Right;  CDE: 13.30  . CATARACT EXTRACTION W/PHACO Left 05/13/2014   Procedure: CATARACT EXTRACTION PHACO AND INTRAOCULAR LENS PLACEMENT LEFT EYE;  Surgeon: Tonny Branch, MD;  Location: [facility];  Service: Ophthalmology;  Laterality: Left;  CDE:13.00  . CHOLECYSTECTOMY N/A 01/15/2015   Procedure: LAPAROSCOPIC CHOLECYSTECTOMY;  Surgeon: Ralene Ok, MD;  Location: [facility];  Service: General;  Laterality: N/A;  . COLONOSCOPY N/A 09/29/2015   Procedure: COLONOSCOPY;  Surgeon: Rogene Houston, MD;  Location: [facility];  Service: Endoscopy;  Laterality: N/A;  . COLONOSCOPY    . ESOPHAGOGASTRODUODENOSCOPY N/A 01/13/2015   Procedure: ESOPHAGOGASTRODUODENOSCOPY (EGD);  Surgeon: Jerene Bears, MD;  Location[facility];  Service: Endoscopy;  Laterality: N/A;  . ESOPHAGOGASTRODUODENOSCOPY N/A 09/29/2015   Procedure: ESOPHAGOGASTRODUODENOSCOPY (EGD);  Surgeon: Rogene Houston, MD;  Location: [facility];  Service: Endoscopy;  Laterality: N/A;  12:15  . GALLBLADDER SURGERY    . SHOULDER OPEN ROTATOR CUFF REPAIR     bilateral  . TOTAL KNEE ARTHROPLASTY  05/26/10   right  . TOTAL KNEE ARTHROPLASTY  11/17/2010   Procedure: TOTAL KNEE ARTHROPLASTY;  Surgeon: Mauri Pole;  Location: [facility];  Service: Orthopedics;  Laterality: Left;  . TOTAL KNEE ARTHROPLASTY     Right  . TOTAL SHOULDER REPLACEMENT  01/2010   left    ROS:  As stated in the HPI and negative for all other systems.  PHYSICAL EXAM BP (!) 150/80   Pulse 72   Ht 5\' 2"  (1.575 m)   Wt 127 lb (57.6 kg)   BMI 23.23 kg/m   GENERAL:  Well appearing NECK:  No jugular venous distention, waveform within normal limits, carotid upstroke brisk and symmetric, no bruits, no thyromegaly LUNGS:  Clear to auscultation bilaterally BACK:  No CVA tenderness CHEST:   Unremarkable HEART:  PMI not displaced or sustained,S1 and S2 within normal limits, no S3, no S4, no clicks, no rubs, no murmurs ABD:  Flat, positive bowel sounds normal in frequency in pitch, no bruits, no rebound, no guarding, no midline pulsatile mass, no hepatomegaly, no splenomegaly EXT:  2 plus pulses throughout, no edema, no cyanosis no clubbing  NEURO:  Cranial nerves II through XII grossly intact, motor grossly intact throughout, she seems to have some short term memory issues.   EKG:  Sinus rhythm, rate 72, axis within normal limits, intervals within normal limits, no acute ST-T wave changes.  ASSESSMENT AND PLAN  CHEST PAIN:  This is somewhat atypical but needs to be screened. She could not  walk on a treadmill. Therefore, she will have a [employer].  ATRIAL FIB/FLUTTER:    [name] has a CHA2DS2 - VASc score of 6.   She will continue with anticoagulation. For now I will continue the Flecainide since she restarted this but I will make this 1/2 tablet twice daily.  PVCs:   These are about 3% by a Holter previously.  No change in therapy is planned.    HTN:  The blood pressure is at target.  Meds will be as above.    (Greater than 40 minutes reviewing all data with greater than 50% face to face with the patient).  Hospital records reviewed for this visit.

## 2016-06-10 ENCOUNTER — Encounter (HOSPITAL_COMMUNITY): Payer: Self-pay

## 2016-06-10 ENCOUNTER — Encounter (HOSPITAL_COMMUNITY): Admission: RE | Admit: 2016-06-10 | Payer: Medicare Other | Source: Ambulatory Visit

## 2016-06-10 ENCOUNTER — Encounter (HOSPITAL_COMMUNITY)
Admission: RE | Admit: 2016-06-10 | Discharge: 2016-06-10 | Disposition: A | Discharge status: Home / Self Care | Payer: Medicare Other | Source: Ambulatory Visit | Attending: Cardiology | Admitting: Cardiology

## 2016-06-10 DIAGNOSIS — R079 Chest pain, unspecified: Secondary | ICD-10-CM

## 2016-06-10 MED ORDER — SODIUM CHLORIDE 0.9% FLUSH
INTRAVENOUS | Status: AC
  Administered 2016-06-10: 10 mL via INTRAVENOUS
  Filled 2016-06-10: qty 10

## 2016-06-10 MED ORDER — REGADENOSON 0.4 MG/5ML IV SOLN
INTRAVENOUS | Status: AC
  Filled 2016-06-10: qty 5

## 2016-06-10 MED ORDER — TECHNETIUM TC 99M TETROFOSMIN IV KIT
30.0000 | PACK | Freq: Once | INTRAVENOUS | Status: AC | PRN
  Administered 2016-06-11: 30 via INTRAVENOUS

## 2016-06-10 MED ORDER — TECHNETIUM TC 99M TETROFOSMIN IV KIT
10.0000 | PACK | Freq: Once | INTRAVENOUS | Status: AC | PRN
  Administered 2016-06-10: 11 via INTRAVENOUS

## 2016-06-11 ENCOUNTER — Encounter (HOSPITAL_COMMUNITY)
Admission: RE | Admit: 2016-06-11 | Discharge: 2016-06-11 | Disposition: A | Discharge status: Home / Self Care | Payer: Medicare Other | Source: Ambulatory Visit | Attending: Cardiology | Admitting: Cardiology

## 2016-06-11 DIAGNOSIS — R079 Chest pain, unspecified: Secondary | ICD-10-CM | POA: Diagnosis not present

## 2016-06-11 LAB — NM MYOCAR MULTI W/SPECT W/WALL MOTION / EF
CHL CUP NUCLEAR SDS: 0
CHL CUP NUCLEAR SRS: 2
CHL CUP RESTING HR STRESS: 65 {beats}/min
CSEPPHR: 95 {beats}/min
LV dias vol: 57 mL (ref 46–106)
LV sys vol: 17 mL
RATE: 0.34
SSS: 2
TID: 1.74

## 2016-06-11 MED ORDER — REGADENOSON 0.4 MG/5ML IV SOLN
INTRAVENOUS | Status: AC
  Administered 2016-06-11: 0.4 mg via INTRAVENOUS
  Filled 2016-06-11: qty 5

## 2016-06-11 MED ORDER — SODIUM CHLORIDE 0.9% FLUSH
INTRAVENOUS | Status: AC
  Filled 2016-06-11: qty 10

## 2016-06-11 MED ORDER — REGADENOSON 0.4 MG/5ML IV SOLN
INTRAVENOUS | Status: AC
  Filled 2016-06-11: qty 5

## 2016-06-11 MED ORDER — SODIUM CHLORIDE 0.9% FLUSH
INTRAVENOUS | Status: AC
  Administered 2016-06-11: 10 mL via INTRAVENOUS
  Filled 2016-06-11: qty 10

## 2016-06-14 ENCOUNTER — Telehealth: Payer: Self-pay | Admitting: Cardiology

## 2016-06-14 NOTE — Telephone Encounter (Signed)
Closed Encounter  °

## 2016-06-15 NOTE — Progress Notes (Signed)
HPI The patient has a history of atrial fibrillation and premature contractions.   She was in the hospital in April.    She had atypical chest pain.  She did have LBBB and was in atrial flutter.  Because of the new LBBB her flecainide was discontinued.  She did have an echocardiogram.  She did have grade II diastolic dysfunction.  Otherwise there were no significant abnormalities.  She was continuing to have discomfort and she had a stress test at the after the last visit.  This demonstrated an abnormal TID ratio suggesting possible multivessel disease.  The EF was normal and there were no ischemic ST T wave changes.  She has some trouble with short-term memory. She's not having any new shortness of breath, PND or orthopnea. She's not having any new chest burning, neck or arm discomfort. She didn't bring her medications with her again today. This was a problem at the last appointment in quite a while to reconcile her meds. As noted in the previous note she has restarted flecainide on her own. She's had some rare skipping heartbeats but nothing sustained.  Allergies  Allergen Reactions  . Penicillins Rash and Other (See Comments)    Has patient had a PCN reaction causing immediate rash, facial/tongue/throat swelling, SOB or lightheadedness with hypotension: No Has patient had a PCN reaction causing severe rash involving mucus membranes or skin necrosis: No Has patient had a PCN reaction that required hospitalization No Has patient had a PCN reaction occurring within the last 10 years: No If all of the above answers are "NO", then may proceed with Cephalosporin use.    Current Outpatient Prescriptions  Medication Sig Dispense Refill  . atenolol (TENORMIN) 50 MG tablet Take 50 mg by mouth daily.    Marland Kitchen atorvastatin (LIPITOR) 20 MG tablet Take 20 mg by mouth daily.    . flecainide (TAMBOCOR) 100 MG tablet Take 0.5 tablets (50 mg total) by mouth 2 (two) times daily.    . furosemide (LASIX) 40 MG  tablet Take 20 mg by mouth daily. Take 1/2 tablet by mouth in the morning and take one tablet by mouth if swelling occurs    . levETIRAcetam (KEPPRA) 500 MG tablet Take 250 mg by mouth 2 (two) times daily.    Marland Kitchen lisinopril (PRINIVIL,ZESTRIL) 10 MG tablet Take 10 mg by mouth daily.    . pantoprazole (PROTONIX) 40 MG tablet Take 1 tablet (40 mg total) by mouth daily. 30 tablet 3  . traMADol (ULTRAM) 50 MG tablet Take 0.5-1 tablets (25-50 mg total) by mouth every 8 (eight) hours as needed. 30 tablet 0  . warfarin (COUMADIN) 5 MG tablet Take 1/2 tablet on mondays and fridays. Take 1 tablet all other days. 85 tablet 1  . zolpidem (AMBIEN) 5 MG tablet Take 2.5 to 5mg  by mouth as needed for insomnia    . levothyroxine (SYNTHROID, LEVOTHROID) 50 MCG tablet Take 50 mcg by mouth daily before breakfast.      No current facility-administered medications for this visit.     Past Medical History:  Diagnosis Date  . Anemia   . Atrial fibrillation (HCC)    Paroxysmal, Flecainide therapy  . Calf pain    September, 2012, at rest  . Cardiomegaly    EF normal, echo, 2003  //   EF 60-65%, echo, June 21, 8561, diastolic dysfunction,  . Carotid bruit    Doppler, December, 2009, no abnormality  . Cataract   . Diverticulosis   .  GERD (gastroesophageal reflux disease)   . History of shingles   . HLD (hyperlipidemia)   . HTN (hypertension)   . Hypothyroidism   . Hypothyroidism   . Insomnia   . Normal nuclear stress test    Normal, 2001  . Osteoarthritis   . PONV (postoperative nausea and vomiting)   . Rectal bleeding 2001   diverticulosis and int hemorrhoids on 07/1999 and 02/2010 colonoscopies.  . Stroke ([place])   . Warfarin anticoagulation     Past Surgical History:  Procedure Laterality Date  . BIOPSY  09/29/2015   Procedure: BIOPSY;  Surgeon: Rogene Houston, MD;  [facility];  Service: Endoscopy;;  gastric  . CATARACT EXTRACTION W/PHACO Right 04/15/2014   Procedure: CATARACT EXTRACTION  PHACO AND INTRAOCULAR LENS PLACEMENT (IOC);  Surgeon: Tonny Branch, MD;  Location: [facility];  Service: Ophthalmology;  Laterality: Right;  CDE: 13.30  . CATARACT EXTRACTION W/PHACO Left 05/13/2014   Procedure: CATARACT EXTRACTION PHACO AND INTRAOCULAR LENS PLACEMENT LEFT EYE;  Surgeon: Tonny Branch, MD;  Location: [facility];  Service: Ophthalmology;  Laterality: Left;  CDE:13.00  . CHOLECYSTECTOMY N/A 01/15/2015   Procedure: LAPAROSCOPIC CHOLECYSTECTOMY;  Surgeon: Ralene Ok, MD;  Location: [facility];  Service: General;  Laterality: N/A;  . COLONOSCOPY N/A 09/29/2015   Procedure: COLONOSCOPY;  Surgeon: Rogene Houston, MD;  [facility];  Service: Endoscopy;  Laterality: N/A;  . COLONOSCOPY    . ESOPHAGOGASTRODUODENOSCOPY N/A 01/13/2015   Procedure: ESOPHAGOGASTRODUODENOSCOPY (EGD);  Surgeon: Jerene Bears, MD;  Location: [facility];  Service: Endoscopy;  Laterality: N/A;  . ESOPHAGOGASTRODUODENOSCOPY N/A 09/29/2015   Procedure: ESOPHAGOGASTRODUODENOSCOPY (EGD);  Surgeon: Rogene Houston, MD;  [facility];  Service: Endoscopy;  Laterality: N/A;  12:15  . GALLBLADDER SURGERY    . SHOULDER OPEN ROTATOR CUFF REPAIR     bilateral  . TOTAL KNEE ARTHROPLASTY  05/26/10   right  . TOTAL KNEE ARTHROPLASTY  11/17/2010   Procedure: TOTAL KNEE ARTHROPLASTY;  Surgeon: Mauri Pole;  Location: [facility];  Service: Orthopedics;  Laterality: Left;  . TOTAL KNEE ARTHROPLASTY     Right  . TOTAL SHOULDER REPLACEMENT  01/2010   left    ROS:  Insomnia.  Otherwise as stated in the HPI and negative for all other systems.  (In particular she denies any of the GI bleeding that she had in the distant past. )  PHYSICAL EXAM BP (!) 162/70   Pulse 60   Ht 5\' 2"  (1.575 m)   Wt 135 lb (61.2 kg)   BMI 24.69 kg/m   GENERAL:  Well appearing HEENT:  Pupils equal round reactive to light, edentulous NECK:  No jugular venous distention, waveform within normal limits, carotid upstroke brisk and symmetric, no  bruits, no thyromegaly LUNGS:  Clear to auscultation bilaterally CHEST:  Unremarkable HEART:  PMI not displaced or sustained,S1 and S2 within normal limits, no S3, no S4, no clicks, no rubs, no murmurs ABD:  Flat, positive bowel sounds normal in frequency in pitch, no bruits, no rebound, no guarding, no midline pulsatile mass, no hepatomegaly, no splenomegaly EXT:  2 plus pulses throughout, no edema, no cyanosis no clubbing, missing second toe on the left foot NEURO:  Cranial nerves II through XII grossly intact, motor intact throughout, nonfocal.    ASSESSMENT AND PLAN  CHEST PAIN:  The stress test suggested possible multivessel disease.  Given this, the change in her EKG recently, the fact that she's back on flecainide and  a high risk nature of the study cardiac cath is indicated.   The patient understands that risks included but are not limited to stroke (1 in 1000), death (1 in 17), kidney failure [usually temporary] (1 in 500), bleeding (1 in 200), allergic reaction [possibly serious] (1 in 200).  The patient understands and agrees to proceed.   ATRIAL FIB/FLUTTER:    Ms. [place] OBORN has a CHA2DS2 - VASc score of 6.   She will hold her warfarin for 5 days prior to the procedure and start aspirin 2 days before the procedure.  For now I will continue the Flecainide   PVCs:   These are about 3% by a Holter previously.  She is rarely feeling these and no change in therapy is planned.   HTN:  The blood pressure is elevated.  This is somewhat unusual.  For now she will continue meds as listed.  If it is elevated at the time of the cath we should increase the lisinopril.

## 2016-06-16 ENCOUNTER — Other Ambulatory Visit: Payer: Medicare Other

## 2016-06-16 ENCOUNTER — Ambulatory Visit (INDEPENDENT_AMBULATORY_CARE_PROVIDER_SITE_OTHER): Payer: Medicare Other | Admitting: Cardiology

## 2016-06-16 ENCOUNTER — Encounter: Payer: Self-pay | Admitting: Cardiology

## 2016-06-16 VITALS — BP 162/70 | HR 60 | Ht 62.0 in | Wt 135.0 lb

## 2016-06-16 DIAGNOSIS — I10 Essential (primary) hypertension: Secondary | ICD-10-CM

## 2016-06-16 DIAGNOSIS — Z01818 Encounter for other preprocedural examination: Secondary | ICD-10-CM | POA: Diagnosis not present

## 2016-06-16 DIAGNOSIS — I200 Unstable angina: Secondary | ICD-10-CM | POA: Diagnosis not present

## 2016-06-16 DIAGNOSIS — R9439 Abnormal result of other cardiovascular function study: Secondary | ICD-10-CM | POA: Diagnosis not present

## 2016-06-16 DIAGNOSIS — I4892 Unspecified atrial flutter: Secondary | ICD-10-CM

## 2016-06-16 NOTE — Patient Instructions (Addendum)
Medication Instructions:  Please hold Warfarin 5 days prior to your cardiac cath.  Start ASA 81 mg 2 days before the cath. Continue all other medications as listed.  Labwork: Please have blood work today at [facility]. (BMP, CBC and PT/INR)  Testing/Procedures: Your physician has requested that you have a cardiac catheterization. Cardiac catheterization is used to diagnose and/or treat various heart conditions. Doctors may recommend this procedure for a number of different reasons. The most common reason is to evaluate chest pain. Chest pain can be a symptom of coronary artery disease (CAD), and cardiac catheterization can show whether plaque is narrowing or blocking your heart's arteries. This procedure is also used to evaluate the valves, as well as measure the blood flow and oxygen levels in different parts of your heart. For further information please visit [email or web address]. Please follow instruction sheet, as given.  Follow-Up: As determined after cath.  If you need a refill on your cardiac medications before your next appointment, please call your pharmacy.  Thank you for choosing [facility]!!      [address] [place] [address] Dept: [phone] Loc: [phone]  [name]  06/16/2016  You are scheduled for a cardiac cath on Tuesday, June 22, 2016 with Dr. Claiborne Billings.  1. Please arrive at the [facility] (Main Entrance A) at [facility]: [address] [place], [place] [address] at 8:30 am (two hours before your procedure to ensure your preparation). Free valet parking service is available.   Special note: Every effort is made to have your procedure done on time. Please understand that emergencies sometimes delay scheduled procedures.  2. Diet: Nothing to eat or drink after midnight the night before.  3. Labs: please have blood work today at [facility].  4. Medication  instructions in preparation for your procedure: Please hold your Warfarin starting today.  Start ASA 81 mg on Sunday. You may take all other medications as listed with sips of water that morning.  On the morning of your procedure, take your ASA and any morning medicines NOT listed above.  You may use sips of water.  5. Plan for one night stay--bring personal belongings. 6. Bring a current list of your medications and current insurance cards. 7. You MUST have a responsible person to drive you home. 8. Someone MUST be with you the first 24 hours after you arrive home or your discharge will be delayed. 9. Please wear clothes that are easy to get on and off and wear slip-on shoes.  Thank you for allowing [place] to care for you!   -- [facility]

## 2016-06-17 ENCOUNTER — Encounter: Payer: Self-pay | Admitting: Pharmacist

## 2016-06-17 LAB — BASIC METABOLIC PANEL
BUN / CREAT RATIO: 16 (ref 12–28)
BUN: 12 mg/dL (ref 8–27)
CHLORIDE: 99 mmol/L (ref 96–106)
CO2: 24 mmol/L (ref 20–29)
Calcium: 9 mg/dL (ref 8.7–10.3)
Creatinine, Ser: 0.74 mg/dL (ref 0.57–1.00)
GFR calc non Af Amer: 76 mL/min/{1.73_m2} (ref >59)
GFR, EST AFRICAN AMERICAN: 87 mL/min/{1.73_m2} (ref >59)
Glucose: 77 mg/dL (ref 65–99)
POTASSIUM: 5.1 mmol/L (ref 3.5–5.2)
Sodium: 137 mmol/L (ref 134–144)

## 2016-06-17 LAB — PROTIME-INR
INR: 1.3 — ABNORMAL HIGH (ref 0.8–1.2)
Prothrombin Time: 13.3 s — ABNORMAL HIGH (ref 9.1–12.0)

## 2016-06-17 LAB — CBC
HEMATOCRIT: 36.3 % (ref 34.0–46.6)
Hemoglobin: 11.8 g/dL (ref 11.1–15.9)
MCH: 31.1 pg (ref 26.6–33.0)
MCHC: 32.5 g/dL (ref 31.5–35.7)
MCV: 96 fL (ref 79–97)
PLATELETS: 383 10*3/uL — AB (ref 150–379)
RBC: 3.79 x10E6/uL (ref 3.77–5.28)
RDW: 13.3 % (ref 12.3–15.4)
WBC: 6 10*3/uL (ref 3.4–10.8)

## 2016-06-21 ENCOUNTER — Telehealth: Payer: Self-pay

## 2016-06-21 ENCOUNTER — Other Ambulatory Visit: Payer: Self-pay | Admitting: Family Medicine

## 2016-06-21 NOTE — Telephone Encounter (Signed)
Patient contacted pre-catheterization at [facility] scheduled for: 06/22/2016 @ 1030 Verified arrival time and place:  Gave direction to husband to NT.  Verified arrival time with wife of 0830 Confirmed AM meds to be taken pre-cath with sip of water:  Instructed Pt to take ASA 81 mg in am with sip of water.  Reminded to not take lasix day of procedure, and Pt indicates she has been holding her warfarin. Confirmed patient has responsible person to drive home post procedure and observe patient for 24 hours: yes,  husband Addl concerns:  None noted

## 2016-06-22 ENCOUNTER — Ambulatory Visit (HOSPITAL_COMMUNITY)
Admission: RE | Disposition: A | Discharge status: Home / Self Care | Payer: Self-pay | Source: Ambulatory Visit | Attending: Cardiovascular Disease

## 2016-06-22 ENCOUNTER — Ambulatory Visit (HOSPITAL_COMMUNITY)
Admission: RE | Admit: 2016-06-22 | Discharge: 2016-06-22 | Disposition: A | Discharge status: Home / Self Care | Payer: Medicare Other | Source: Ambulatory Visit | Attending: Cardiovascular Disease | Admitting: Cardiovascular Disease

## 2016-06-22 DIAGNOSIS — E039 Hypothyroidism, unspecified: Secondary | ICD-10-CM | POA: Insufficient documentation

## 2016-06-22 DIAGNOSIS — E785 Hyperlipidemia, unspecified: Secondary | ICD-10-CM | POA: Diagnosis not present

## 2016-06-22 DIAGNOSIS — I493 Ventricular premature depolarization: Secondary | ICD-10-CM | POA: Diagnosis not present

## 2016-06-22 DIAGNOSIS — M1990 Unspecified osteoarthritis, unspecified site: Secondary | ICD-10-CM | POA: Insufficient documentation

## 2016-06-22 DIAGNOSIS — R9439 Abnormal result of other cardiovascular function study: Secondary | ICD-10-CM | POA: Diagnosis not present

## 2016-06-22 DIAGNOSIS — Z7901 Long term (current) use of anticoagulants: Secondary | ICD-10-CM | POA: Diagnosis not present

## 2016-06-22 DIAGNOSIS — K219 Gastro-esophageal reflux disease without esophagitis: Secondary | ICD-10-CM | POA: Diagnosis not present

## 2016-06-22 DIAGNOSIS — G4700 Insomnia, unspecified: Secondary | ICD-10-CM | POA: Diagnosis not present

## 2016-06-22 DIAGNOSIS — I447 Left bundle-branch block, unspecified: Secondary | ICD-10-CM | POA: Insufficient documentation

## 2016-06-22 DIAGNOSIS — I2510 Atherosclerotic heart disease of native coronary artery without angina pectoris: Secondary | ICD-10-CM | POA: Insufficient documentation

## 2016-06-22 DIAGNOSIS — I480 Paroxysmal atrial fibrillation: Secondary | ICD-10-CM | POA: Diagnosis not present

## 2016-06-22 DIAGNOSIS — I10 Essential (primary) hypertension: Secondary | ICD-10-CM | POA: Insufficient documentation

## 2016-06-22 DIAGNOSIS — I4892 Unspecified atrial flutter: Secondary | ICD-10-CM | POA: Diagnosis not present

## 2016-06-22 DIAGNOSIS — Z880 Allergy status to penicillin: Secondary | ICD-10-CM | POA: Diagnosis not present

## 2016-06-22 DIAGNOSIS — Z8673 Personal history of transient ischemic attack (TIA), and cerebral infarction without residual deficits: Secondary | ICD-10-CM | POA: Diagnosis not present

## 2016-06-22 DIAGNOSIS — I2584 Coronary atherosclerosis due to calcified coronary lesion: Secondary | ICD-10-CM | POA: Insufficient documentation

## 2016-06-22 DIAGNOSIS — R0789 Other chest pain: Secondary | ICD-10-CM | POA: Insufficient documentation

## 2016-06-22 DIAGNOSIS — R931 Abnormal findings on diagnostic imaging of heart and coronary circulation: Secondary | ICD-10-CM

## 2016-06-22 HISTORY — PX: LEFT HEART CATH AND CORONARY ANGIOGRAPHY: CATH118249

## 2016-06-22 LAB — PROTIME-INR
INR: 1.01
PROTHROMBIN TIME: 13.3 s (ref 11.4–15.2)

## 2016-06-22 SURGERY — LEFT HEART CATH AND CORONARY ANGIOGRAPHY
Anesthesia: LOCAL

## 2016-06-22 MED ORDER — ASPIRIN 81 MG PO CHEW: 81.0000 mg | CHEWABLE_TABLET | ORAL | Status: DC

## 2016-06-22 MED ORDER — LIDOCAINE HCL (PF) 1 % IJ SOLN
INTRAMUSCULAR | Status: DC | PRN
  Administered 2016-06-22: 2 mL
  Administered 2016-06-22: 22 mL

## 2016-06-22 MED ORDER — ACETAMINOPHEN 325 MG PO TABS
ORAL_TABLET | ORAL | Status: AC
Start: 2016-06-22 — End: 2016-06-22
  Administered 2016-06-22: 650 mg via ORAL
  Filled 2016-06-22: qty 2

## 2016-06-22 MED ORDER — SODIUM CHLORIDE 0.9 % IV SOLN: 250.0000 mL | INTRAVENOUS | Status: DC | PRN

## 2016-06-22 MED ORDER — HEPARIN SODIUM (PORCINE) 1000 UNIT/ML IJ SOLN
INTRAMUSCULAR | Status: AC
  Filled 2016-06-22: qty 1

## 2016-06-22 MED ORDER — SODIUM CHLORIDE 0.9% FLUSH: 3.0000 mL | Freq: Two times a day (BID) | INTRAVENOUS | Status: DC

## 2016-06-22 MED ORDER — HYDRALAZINE HCL 20 MG/ML IJ SOLN
INTRAMUSCULAR | Status: DC | PRN
  Administered 2016-06-22: 20 mg via INTRAVENOUS

## 2016-06-22 MED ORDER — SODIUM CHLORIDE 0.9 % IV SOLN: INTRAVENOUS | Status: DC

## 2016-06-22 MED ORDER — FENTANYL CITRATE (PF) 100 MCG/2ML IJ SOLN
INTRAMUSCULAR | Status: DC | PRN
  Administered 2016-06-22: 25 ug via INTRAVENOUS

## 2016-06-22 MED ORDER — MIDAZOLAM HCL 2 MG/2ML IJ SOLN
INTRAMUSCULAR | Status: AC
  Filled 2016-06-22: qty 2

## 2016-06-22 MED ORDER — HEPARIN (PORCINE) IN NACL 2-0.9 UNIT/ML-% IJ SOLN
INTRAMUSCULAR | Status: AC | PRN
  Administered 2016-06-22: 1000 mL

## 2016-06-22 MED ORDER — IOPAMIDOL (ISOVUE-370) INJECTION 76%
INTRAVENOUS | Status: DC | PRN
  Administered 2016-06-22: 60 mL via INTRAVENOUS

## 2016-06-22 MED ORDER — SODIUM CHLORIDE 0.9 % WEIGHT BASED INFUSION
3.0000 mL/kg/h | INTRAVENOUS | Status: AC
  Administered 2016-06-22: 3 mL/kg/h via INTRAVENOUS

## 2016-06-22 MED ORDER — ONDANSETRON HCL 4 MG/2ML IJ SOLN: 4.0000 mg | Freq: Four times a day (QID) | INTRAMUSCULAR | Status: DC | PRN

## 2016-06-22 MED ORDER — ASPIRIN 81 MG PO CHEW: 81.0000 mg | CHEWABLE_TABLET | Freq: Every day | ORAL | Status: DC

## 2016-06-22 MED ORDER — VERAPAMIL HCL 2.5 MG/ML IV SOLN
INTRAVENOUS | Status: AC
  Filled 2016-06-22: qty 2

## 2016-06-22 MED ORDER — FENTANYL CITRATE (PF) 100 MCG/2ML IJ SOLN
INTRAMUSCULAR | Status: AC
Start: 2016-06-22 — End: 2016-06-22
  Filled 2016-06-22: qty 2

## 2016-06-22 MED ORDER — HYDRALAZINE HCL 20 MG/ML IJ SOLN
INTRAMUSCULAR | Status: AC
  Filled 2016-06-22: qty 1

## 2016-06-22 MED ORDER — SODIUM CHLORIDE 0.9% FLUSH: 3.0000 mL | INTRAVENOUS | Status: DC | PRN

## 2016-06-22 MED ORDER — MIDAZOLAM HCL 2 MG/2ML IJ SOLN
INTRAMUSCULAR | Status: DC | PRN
  Administered 2016-06-22 (×2): 1 mg via INTRAVENOUS

## 2016-06-22 MED ORDER — ACETAMINOPHEN 325 MG PO TABS
650.0000 mg | ORAL_TABLET | ORAL | Status: DC | PRN
  Administered 2016-06-22: 650 mg via ORAL

## 2016-06-22 MED ORDER — SODIUM CHLORIDE 0.9 % WEIGHT BASED INFUSION: 1.0000 mL/kg/h | INTRAVENOUS | Status: DC

## 2016-06-22 MED ORDER — LIDOCAINE HCL (PF) 1 % IJ SOLN
INTRAMUSCULAR | Status: AC
Start: 2016-06-22 — End: 2016-06-22
  Filled 2016-06-22: qty 30

## 2016-06-22 MED ORDER — HEPARIN (PORCINE) IN NACL 2-0.9 UNIT/ML-% IJ SOLN
INTRAMUSCULAR | Status: AC
  Filled 2016-06-22: qty 1000

## 2016-06-22 MED ORDER — DIAZEPAM 5 MG PO TABS: 5.0000 mg | ORAL_TABLET | Freq: Four times a day (QID) | ORAL | Status: DC | PRN

## 2016-06-22 MED ORDER — IOPAMIDOL (ISOVUE-370) INJECTION 76%
INTRAVENOUS | Status: AC
  Filled 2016-06-22: qty 100

## 2016-06-22 SURGICAL SUPPLY — 13 items
CATH INFINITI 5FR MULTPACK ANG (CATHETERS) ×1 IMPLANT
DEVICE RAD COMP TR BAND LRG (VASCULAR PRODUCTS) ×1 IMPLANT
GLIDESHEATH SLEND SS 6F .021 (SHEATH) ×1 IMPLANT
GUIDEWIRE INQWIRE 1.5J.035X260 (WIRE) IMPLANT
INQWIRE 1.5J .035X260CM (WIRE) ×2
KIT HEART LEFT (KITS) ×2 IMPLANT
PACK CARDIAC CATHETERIZATION (CUSTOM PROCEDURE TRAY) ×2 IMPLANT
SHEATH GLIDE SLENDER 4/5FR (SHEATH) ×1 IMPLANT
SHEATH PINNACLE 5F 10CM (SHEATH) ×1 IMPLANT
SYR MEDRAD MARK V 150ML (SYRINGE) ×2 IMPLANT
TRANSDUCER W/STOPCOCK (MISCELLANEOUS) ×2 IMPLANT
TUBING CIL FLEX 10 FLL-RA (TUBING) ×2 IMPLANT
WIRE HI TORQ VERSACORE-J 145CM (WIRE) ×1 IMPLANT

## 2016-06-22 NOTE — H&P (View-Only) (Signed)
HPI The patient has a history of atrial fibrillation and premature contractions.   She was in the hospital in April.    She had atypical chest pain.  She did have LBBB and was in atrial flutter.  Because of the new LBBB her flecainide was discontinued.  She did have an echocardiogram.  She did have grade II diastolic dysfunction.  Otherwise there were no significant abnormalities.  She was continuing to have discomfort and she had a stress test at the after the last visit.  This demonstrated an abnormal TID ratio suggesting possible multivessel disease.  The EF was normal and there were no ischemic ST T wave changes.  She has some trouble with short-term memory. She's not having any new shortness of breath, PND or orthopnea. She's not having any new chest burning, neck or arm discomfort. She didn't bring her medications with her again today. This was a problem at the last appointment in quite a while to reconcile her meds. As noted in the previous note she has restarted flecainide on her own. She's had some rare skipping heartbeats but nothing sustained.  Allergies  Allergen Reactions  . Penicillins Rash and Other (See Comments)    Has patient had a PCN reaction causing immediate rash, facial/tongue/throat swelling, SOB or lightheadedness with hypotension: No Has patient had a PCN reaction causing severe rash involving mucus membranes or skin necrosis: No Has patient had a PCN reaction that required hospitalization No Has patient had a PCN reaction occurring within the last 10 years: No If all of the above answers are "NO", then may proceed with Cephalosporin use.    Current Outpatient Prescriptions  Medication Sig Dispense Refill  . atenolol (TENORMIN) 50 MG tablet Take 50 mg by mouth daily.    Marland Kitchen atorvastatin (LIPITOR) 20 MG tablet Take 20 mg by mouth daily.    . flecainide (TAMBOCOR) 100 MG tablet Take 0.5 tablets (50 mg total) by mouth 2 (two) times daily.    . furosemide (LASIX) 40 MG  tablet Take 20 mg by mouth daily. Take 1/2 tablet by mouth in the morning and take one tablet by mouth if swelling occurs    . levETIRAcetam (KEPPRA) 500 MG tablet Take 250 mg by mouth 2 (two) times daily.    Marland Kitchen lisinopril (PRINIVIL,ZESTRIL) 10 MG tablet Take 10 mg by mouth daily.    . pantoprazole (PROTONIX) 40 MG tablet Take 1 tablet (40 mg total) by mouth daily. 30 tablet 3  . traMADol (ULTRAM) 50 MG tablet Take 0.5-1 tablets (25-50 mg total) by mouth every 8 (eight) hours as needed. 30 tablet 0  . warfarin (COUMADIN) 5 MG tablet Take 1/2 tablet on mondays and fridays. Take 1 tablet all other days. 85 tablet 1  . zolpidem (AMBIEN) 5 MG tablet Take 2.5 to 5mg  by mouth as needed for insomnia    . levothyroxine (SYNTHROID, LEVOTHROID) 50 MCG tablet Take 50 mcg by mouth daily before breakfast.      No current facility-administered medications for this visit.     Past Medical History:  Diagnosis Date  . Anemia   . Atrial fibrillation (HCC)    Paroxysmal, Flecainide therapy  . Calf pain    September, 2012, at rest  . Cardiomegaly    EF normal, echo, 2003  //   EF 60-65%, echo, June 22, 6331, diastolic dysfunction,  . Carotid bruit    Doppler, December, 2009, no abnormality  . Cataract   . Diverticulosis   .  GERD (gastroesophageal reflux disease)   . History of shingles   . HLD (hyperlipidemia)   . HTN (hypertension)   . Hypothyroidism   . Hypothyroidism   . Insomnia   . Normal nuclear stress test    Normal, 2001  . Osteoarthritis   . PONV (postoperative nausea and vomiting)   . Rectal bleeding 2001   diverticulosis and int hemorrhoids on 07/1999 and 02/2010 colonoscopies.  . Stroke ([place])   . Warfarin anticoagulation     Past Surgical History:  Procedure Laterality Date  . BIOPSY  09/29/2015   Procedure: BIOPSY;  Surgeon: Rogene Houston, MD;  [facility];  Service: Endoscopy;;  gastric  . CATARACT EXTRACTION W/PHACO Right 04/15/2014   Procedure: CATARACT EXTRACTION  PHACO AND INTRAOCULAR LENS PLACEMENT (IOC);  Surgeon: Tonny Branch, MD;  [facility];  Service: Ophthalmology;  Laterality: Right;  CDE: 13.30  . CATARACT EXTRACTION W/PHACO Left 05/13/2014   Procedure: CATARACT EXTRACTION PHACO AND INTRAOCULAR LENS PLACEMENT LEFT EYE;  Surgeon: Tonny Branch, MD;  [facility];  Service: Ophthalmology;  Laterality: Left;  CDE:13.00  . CHOLECYSTECTOMY N/A 01/15/2015   Procedure: LAPAROSCOPIC CHOLECYSTECTOMY;  Surgeon: Ralene Ok, MD;  Location: [facility];  Service: General;  Laterality: N/A;  . COLONOSCOPY N/A 09/29/2015   Procedure: COLONOSCOPY;  Surgeon: Rogene Houston, MD;  [facility];  Service: Endoscopy;  Laterality: N/A;  . COLONOSCOPY    . ESOPHAGOGASTRODUODENOSCOPY N/A 01/13/2015   Procedure: ESOPHAGOGASTRODUODENOSCOPY (EGD);  Surgeon: Jerene Bears, MD;  Location[facility] END[facility];  Service: Endoscopy;  Laterality: N/A;  . ESOPHAGOGASTRODUODENOSCOPY N/A 09/29/2015   Procedure: ESOPHAGOGASTRODUODENOSCOPY (EGD);  Surgeon: Rogene Houston, MD;  [facility];  Service: Endoscopy;  Laterality: N/A;  12:15  . GALLBLADDER SURGERY    . SHOULDER OPEN ROTATOR CUFF REPAIR     bilateral  . TOTAL KNEE ARTHROPLASTY  05/26/10   right  . TOTAL KNEE ARTHROPLASTY  11/17/2010   Procedure: TOTAL KNEE ARTHROPLASTY;  Surgeon: Mauri Pole;  [facility];  Service: Orthopedics;  Laterality: Left;  . TOTAL KNEE ARTHROPLASTY     Right  . TOTAL SHOULDER REPLACEMENT  01/2010   left    ROS:  Insomnia.  Otherwise as stated in the HPI and negative for all other systems.  (In particular she denies any of the GI bleeding that she had in the distant past. )  PHYSICAL EXAM BP (!) 162/70   Pulse 60   Ht 5\' 2"  (1.575 m)   Wt 135 lb (61.2 kg)   BMI 24.69 kg/m   GENERAL:  Well appearing HEENT:  Pupils equal round reactive to light, edentulous NECK:  No jugular venous distention, waveform within normal limits, carotid upstroke brisk and symmetric, no  bruits, no thyromegaly LUNGS:  Clear to auscultation bilaterally CHEST:  Unremarkable HEART:  PMI not displaced or sustained,S1 and S2 within normal limits, no S3, no S4, no clicks, no rubs, no murmurs ABD:  Flat, positive bowel sounds normal in frequency in pitch, no bruits, no rebound, no guarding, no midline pulsatile mass, no hepatomegaly, no splenomegaly EXT:  2 plus pulses throughout, no edema, no cyanosis no clubbing, missing second toe on the left foot NEURO:  Cranial nerves II through XII grossly intact, motor intact throughout, nonfocal.    ASSESSMENT AND PLAN  CHEST PAIN:  The stress test suggested possible multivessel disease.  Given this, the change in her EKG recently, the fact that she's back on flecainide and  a high risk nature of the study cardiac cath is indicated.   The patient understands that risks included but are not limited to stroke (1 in 1000), death (1 in 70), kidney failure [usually temporary] (1 in 500), bleeding (1 in 200), allergic reaction [possibly serious] (1 in 200).  The patient understands and agrees to proceed.   ATRIAL FIB/FLUTTER:    Ms. LOUELLEN HALDEMAN has a CHA2DS2 - VASc score of 6.   She will hold her warfarin for 5 days prior to the procedure and start aspirin 2 days before the procedure.  For now I will continue the Flecainide   PVCs:   These are about 3% by a Holter previously.  She is rarely feeling these and no change in therapy is planned.   HTN:  The blood pressure is elevated.  This is somewhat unusual.  For now she will continue meds as listed.  If it is elevated at the time of the cath we should increase the lisinopril.

## 2016-06-22 NOTE — H&P (View-Only) (Signed)
HPI The patient has a history of atrial fibrillation and premature contractions.   She was in the hospital in April.  I reviewed these records for this visit.    She had atypical chest pain.  She did have LBBB and was in atrial flutter.  Because of the new LBBB her flecainide was discontinued.  She did have an echocardiogram.  She did have grade II diastolic dysfunction.  Otherwise there were no significant abnormalities.  Since going home she's continued to have some chest discomfort. This seems to be episodic. It's a burning discomfort it happened once since discharge. She said it starts in her mid sternum and goes through to her back. It waxes and wanes in intensity. She might get mildly short of breath. She'll get dizzy with this. She might have some palpitations although she's not clear on this sequence. She doesn't remember the events of her hospitalization at all. She is talking about having some short-term memory this   problems. She didn't bring her medicines originally and we had to send her back home to get these.  It took several tries but we finally corrected the list as above.  Of most importance she has restarted her Flecainide on her own 1/2 tablet daily.  This had been stopped pending a work up of chest pain.     Allergies  Allergen Reactions  . Penicillins Rash and Other (See Comments)    Has patient had a PCN reaction causing immediate rash, facial/tongue/throat swelling, SOB or lightheadedness with hypotension: No Has patient had a PCN reaction causing severe rash involving mucus membranes or skin necrosis: No Has patient had a PCN reaction that required hospitalization No Has patient had a PCN reaction occurring within the last 10 years: No If all of the above answers are "NO", then may proceed with Cephalosporin use.    Current Outpatient Prescriptions  Medication Sig Dispense Refill  . atenolol (TENORMIN) 50 MG tablet Take 50 mg by mouth daily.    Marland Kitchen atorvastatin  (LIPITOR) 20 MG tablet Take 20 mg by mouth daily.    . flecainide (TAMBOCOR) 100 MG tablet Take 100 mg by mouth 2 (two) times daily.    . furosemide (LASIX) 40 MG tablet Take 20 mg by mouth daily. Take 1/2 tablet by mouth in the morning and take one tablet by mouth if swelling occurs    . levETIRAcetam (KEPPRA) 500 MG tablet Take 250 mg by mouth 2 (two) times daily.    Marland Kitchen lisinopril (PRINIVIL,ZESTRIL) 10 MG tablet Take 10 mg by mouth daily.    . pantoprazole (PROTONIX) 40 MG tablet Take 1 tablet (40 mg total) by mouth daily. 30 tablet 3  . traMADol (ULTRAM) 50 MG tablet Take 0.5-1 tablets (25-50 mg total) by mouth every 8 (eight) hours as needed. 30 tablet 0  . warfarin (COUMADIN) 5 MG tablet Take 1/2 tablet on mondays and fridays. Take 1 tablet all other days. 85 tablet 1  . zolpidem (AMBIEN) 5 MG tablet Take 2.5 to 5mg  by mouth as needed for insomnia     No current facility-administered medications for this visit.     Past Medical History:  Diagnosis Date  . Anemia   . Atrial fibrillation (HCC)    Paroxysmal, Flecainide therapy  . Calf pain    September, 2012, at rest  . Cardiomegaly    EF normal, echo, 2003  //   EF 60-65%, echo, June 22, 930, diastolic dysfunction,  . Carotid bruit  Doppler, December, 2009, no abnormality  . Cataract   . Diverticulosis   . GERD (gastroesophageal reflux disease)   . History of shingles   . HLD (hyperlipidemia)   . HTN (hypertension)   . Hypothyroidism   . Hypothyroidism   . Insomnia   . Normal nuclear stress test    Normal, 2001  . Osteoarthritis   . PONV (postoperative nausea and vomiting)   . Rectal bleeding 2001   diverticulosis and int hemorrhoids on 07/1999 and 02/2010 colonoscopies.  . Stroke ([place])   . Warfarin anticoagulation     Past Surgical History:  Procedure Laterality Date  . BIOPSY  09/29/2015   Procedure: BIOPSY;  Surgeon: Rogene Houston, MD;  Location: [facility];  Service: Endoscopy;;  gastric  . CATARACT  EXTRACTION W/PHACO Right 04/15/2014   Procedure: CATARACT EXTRACTION PHACO AND INTRAOCULAR LENS PLACEMENT (IOC);  Surgeon: Tonny Branch, MD;  Location: [facility];  Service: Ophthalmology;  Laterality: Right;  CDE: 13.30  . CATARACT EXTRACTION W/PHACO Left 05/13/2014   Procedure: CATARACT EXTRACTION PHACO AND INTRAOCULAR LENS PLACEMENT LEFT EYE;  Surgeon: Tonny Branch, MD;  Location: [facility];  Service: Ophthalmology;  Laterality: Left;  CDE:13.00  . CHOLECYSTECTOMY N/A 01/15/2015   Procedure: LAPAROSCOPIC CHOLECYSTECTOMY;  Surgeon: Ralene Ok, MD;  Location: [facility];  Service: General;  Laterality: N/A;  . COLONOSCOPY N/A 09/29/2015   Procedure: COLONOSCOPY;  Surgeon: Rogene Houston, MD;  [facility]: [facility];  Service: Endoscopy;  Laterality: N/A;  . COLONOSCOPY    . ESOPHAGOGASTRODUODENOSCOPY N/A 01/13/2015   Procedure: ESOPHAGOGASTRODUODENOSCOPY (EGD);  Surgeon: Jerene Bears, MD;  Location: [facility];  Service: Endoscopy;  Laterality: N/A;  . ESOPHAGOGASTRODUODENOSCOPY N/A 09/29/2015   Procedure: ESOPHAGOGASTRODUODENOSCOPY (EGD);  Surgeon: Rogene Houston, MD;  [facility]: [facility];  Service: Endoscopy;  Laterality: N/A;  12:15  . GALLBLADDER SURGERY    . SHOULDER OPEN ROTATOR CUFF REPAIR     bilateral  . TOTAL KNEE ARTHROPLASTY  05/26/10   right  . TOTAL KNEE ARTHROPLASTY  11/17/2010   Procedure: TOTAL KNEE ARTHROPLASTY;  Surgeon: Mauri Pole;  Location: [facility];  Service: Orthopedics;  Laterality: Left;  . TOTAL KNEE ARTHROPLASTY     Right  . TOTAL SHOULDER REPLACEMENT  01/2010   left    ROS:  As stated in the HPI and negative for all other systems.  PHYSICAL EXAM BP (!) 150/80   Pulse 72   Ht 5\' 2"  (1.575 m)   Wt 127 lb (57.6 kg)   BMI 23.23 kg/m   GENERAL:  Well appearing NECK:  No jugular venous distention, waveform within normal limits, carotid upstroke brisk and symmetric, no bruits, no thyromegaly LUNGS:  Clear to auscultation bilaterally BACK:  No CVA tenderness CHEST:   Unremarkable HEART:  PMI not displaced or sustained,S1 and S2 within normal limits, no S3, no S4, no clicks, no rubs, no murmurs ABD:  Flat, positive bowel sounds normal in frequency in pitch, no bruits, no rebound, no guarding, no midline pulsatile mass, no hepatomegaly, no splenomegaly EXT:  2 plus pulses throughout, no edema, no cyanosis no clubbing  NEURO:  Cranial nerves II through XII grossly intact, motor grossly intact throughout, she seems to have some short term memory issues.   EKG:  Sinus rhythm, rate 72, axis within normal limits, intervals within normal limits, no acute ST-T wave changes.  ASSESSMENT AND PLAN  CHEST PAIN:  This is somewhat atypical but needs to be screened. She could not  walk on a treadmill. Therefore, she will have a [employer].  ATRIAL FIB/FLUTTER:    [name] has a CHA2DS2 - VASc score of 6.   She will continue with anticoagulation. For now I will continue the Flecainide since she restarted this but I will make this 1/2 tablet twice daily.  PVCs:   These are about 3% by a Holter previously.  No change in therapy is planned.    HTN:  The blood pressure is at target.  Meds will be as above.    (Greater than 40 minutes reviewing all data with greater than 50% face to face with the patient).  Hospital records reviewed for this visit.

## 2016-06-22 NOTE — Progress Notes (Signed)
Called Angie,PA to see when client to resume coumadin and per Angie,PA resume tomorrow and client and her husband notified and voiced understanding

## 2016-06-22 NOTE — Interval H&P Note (Signed)
Cath Lab Visit (complete for each Cath Lab visit)  Clinical Evaluation Leading to the Procedure:   ACS: No.  Non-ACS:    Anginal Classification: CCS III  Anti-ischemic medical therapy: Maximal Therapy (2 or more classes of medications)  Non-Invasive Test Results: High-risk stress test findings: cardiac mortality >3%/year  Prior CABG: No previous CABG      History and Physical Interval Note:  06/22/2016 12:05 PM  [name]  has presented today for surgery, with the diagnosis of cp  The various methods of treatment have been discussed with the patient and family. After consideration of risks, benefits and other options for treatment, the patient has consented to  Procedure(s): Left Heart Cath and Coronary Angiography (N/A) as a surgical intervention .  The patient's history has been reviewed, patient examined, no change in status, stable for surgery.  I have reviewed the patient's chart and labs.  Questions were answered to the patient's satisfaction.     Shelva Majestic

## 2016-06-22 NOTE — Interval H&P Note (Signed)
History and Physical Interval Note:  06/22/2016 12:05 PM  [name]  has presented today for surgery, with the diagnosis of cp  The various methods of treatment have been discussed with the patient and family. After consideration of risks, benefits and other options for treatment, the patient has consented to  Procedure(s): Left Heart Cath and Coronary Angiography (N/A) as a surgical intervention .  The patient's history has been reviewed, patient examined, no change in status, stable for surgery.  I have reviewed the patient's chart and labs.  Questions were answered to the patient's satisfaction.     Shelva Majestic

## 2016-06-22 NOTE — Progress Notes (Signed)
In and out cath of 900cc clear yellow urine and tolerated well

## 2016-06-22 NOTE — Discharge Instructions (Signed)
Femoral Site Care °Refer to this sheet in the next few weeks. These instructions provide you with information about caring for yourself after your procedure. Your health care provider may also give you more specific instructions. Your treatment has been planned according to current medical practices, but problems sometimes occur. Call your health care provider if you have any problems or questions after your procedure. °What can I expect after the procedure? °After your procedure, it is typical to have the following: °· Bruising at the site that usually fades within 1-2 weeks. °· Blood collecting in the tissue (hematoma) that may be painful to the touch. It should usually decrease in size and tenderness within 1-2 weeks. ° °Follow these instructions at home: °· Take medicines only as directed by your health care provider. °· You may shower 24-48 hours after the procedure or as directed by your health care provider. Remove the bandage (dressing) and gently wash the site with plain soap and water. Pat the area dry with a clean towel. Do not rub the site, because this may cause bleeding. °· Do not take baths, swim, or use a hot tub until your health care provider approves. °· Check your insertion site every day for redness, swelling, or drainage. °· Do not apply powder or lotion to the site. °· Limit use of stairs to twice a day for the first 2-3 days or as directed by your health care provider. °· Do not squat for the first 2-3 days or as directed by your health care provider. °· Do not lift over 10 lb (4.5 kg) for 5 days after your procedure or as directed by your health care provider. °· Ask your health care provider when it is okay to: °? Return to work or school. °? Resume usual physical activities or sports. °? Resume sexual activity. °· Do not drive home if you are discharged the same day as the procedure. Have someone else drive you. °· You may drive 24 hours after the procedure unless otherwise instructed by  your health care provider. °· Do not operate machinery or power tools for 24 hours after the procedure or as directed by your health care provider. °· If your procedure was done as an outpatient procedure, which means that you went home the same day as your procedure, a responsible adult should be with you for the first 24 hours after you arrive home. °· Keep all follow-up visits as directed by your health care provider. This is important. °Contact a health care provider if: °· You have a fever. °· You have chills. °· You have increased bleeding from the site. Hold pressure on the site. °Get help right away if: °· You have unusual pain at the site. °· You have redness, warmth, or swelling at the site. °· You have drainage (other than a small amount of blood on the dressing) from the site. °· The site is bleeding, and the bleeding does not stop after 30 minutes of holding steady pressure on the site. °· Your leg or foot becomes pale, cool, tingly, or numb. °This information is not intended to replace advice given to you by your health care provider. Make sure you discuss any questions you have with your health care provider. °Document Released: 08/24/2013 Document Revised: 05/29/2015 Document Reviewed: 07/10/2013 °Elsevier Interactive Patient Education © 2018 Elsevier Inc. ° °Radial Site Care °Refer to this sheet in the next few weeks. These instructions provide you with information about caring for yourself after your procedure. Your health   care provider may also give you more specific instructions. Your treatment has been planned according to current medical practices, but problems sometimes occur. Call your health care provider if you have any problems or questions after your procedure. °What can I expect after the procedure? °After your procedure, it is typical to have the following: °· Bruising at the radial site that usually fades within 1-2 weeks. °· Blood collecting in the tissue (hematoma) that may be  painful to the touch. It should usually decrease in size and tenderness within 1-2 weeks. ° °Follow these instructions at home: °· Take medicines only as directed by your health care provider. °· You may shower 24-48 hours after the procedure or as directed by your health care provider. Remove the bandage (dressing) and gently wash the site with plain soap and water. Pat the area dry with a clean towel. Do not rub the site, because this may cause bleeding. °· Do not take baths, swim, or use a hot tub until your health care provider approves. °· Check your insertion site every day for redness, swelling, or drainage. °· Do not apply powder or lotion to the site. °· Do not flex or bend the affected arm for 24 hours or as directed by your health care provider. °· Do not push or pull heavy objects with the affected arm for 24 hours or as directed by your health care provider. °· Do not lift over 10 lb (4.5 kg) for 5 days after your procedure or as directed by your health care provider. °· Ask your health care provider when it is okay to: °? Return to work or school. °? Resume usual physical activities or sports. °? Resume sexual activity. °· Do not drive home if you are discharged the same day as the procedure. Have someone else drive you. °· You may drive 24 hours after the procedure unless otherwise instructed by your health care provider. °· Do not operate machinery or power tools for 24 hours after the procedure. °· If your procedure was done as an outpatient procedure, which means that you went home the same day as your procedure, a responsible adult should be with you for the first 24 hours after you arrive home. °· Keep all follow-up visits as directed by your health care provider. This is important. °Contact a health care provider if: °· You have a fever. °· You have chills. °· You have increased bleeding from the radial site. Hold pressure on the site. °Get help right away if: °· You have unusual pain at the  radial site. °· You have redness, warmth, or swelling at the radial site. °· You have drainage (other than a small amount of blood on the dressing) from the radial site. °· The radial site is bleeding, and the bleeding does not stop after 30 minutes of holding steady pressure on the site. °· Your arm or hand becomes pale, cool, tingly, or numb. °This information is not intended to replace advice given to you by your health care provider. Make sure you discuss any questions you have with your health care provider. °Document Released: 01/23/2010 Document Revised: 05/29/2015 Document Reviewed: 07/09/2013 °Elsevier Interactive Patient Education © 2018 Elsevier Inc. ° ° °

## 2016-06-23 ENCOUNTER — Encounter (HOSPITAL_COMMUNITY): Payer: Self-pay | Admitting: Cardiovascular Disease

## 2016-06-23 MED FILL — Verapamil HCl IV Soln 2.5 MG/ML: INTRAVENOUS | Qty: 2 | Status: AC

## 2016-06-24 ENCOUNTER — Encounter (INDEPENDENT_AMBULATORY_CARE_PROVIDER_SITE_OTHER): Payer: Self-pay | Admitting: Internal Medicine

## 2016-06-24 ENCOUNTER — Encounter (INDEPENDENT_AMBULATORY_CARE_PROVIDER_SITE_OTHER): Payer: Self-pay

## 2016-07-08 ENCOUNTER — Ambulatory Visit (INDEPENDENT_AMBULATORY_CARE_PROVIDER_SITE_OTHER): Payer: Medicare Other | Admitting: Internal Medicine

## 2016-07-08 ENCOUNTER — Encounter (INDEPENDENT_AMBULATORY_CARE_PROVIDER_SITE_OTHER): Payer: Self-pay | Admitting: Internal Medicine

## 2016-07-08 VITALS — BP 140/70 | HR 72 | Temp 98.2°F | Ht 63.0 in | Wt 133.3 lb

## 2016-07-08 DIAGNOSIS — K648 Other hemorrhoids: Secondary | ICD-10-CM | POA: Diagnosis not present

## 2016-07-08 DIAGNOSIS — K219 Gastro-esophageal reflux disease without esophagitis: Secondary | ICD-10-CM | POA: Diagnosis not present

## 2016-07-08 NOTE — Patient Instructions (Signed)
Continue the Protonix. OV in 1 year.  

## 2016-07-08 NOTE — Progress Notes (Signed)
   Subjective:    Patient ID: [name], female    DOB: [birth year], 83 y.o.   MRN: [id number]  HPI Here today for f/u. Last seen in October of 2017.  She tells me she is doing pretty good.  Appetite is good. No weight loss.  Has a BM every two days. She denies any melena or BRRB. She is trying to exercise (leg exercise). GERD controlled with Protonix.  She has no GI complaints today  Hx of atrial fib and CVA and maintained on Warfarin.  CBC    Component Value Date/Time   WBC 6.0 06/16/2016 1005   WBC 6.3 04/23/2016 0626   RBC 3.79 06/16/2016 1005   RBC 4.11 04/23/2016 0626   HGB 11.8 06/16/2016 1005   HCT 36.3 06/16/2016 1005   PLT 383 (H) 06/16/2016 1005   MCV 96 06/16/2016 1005   MCH 31.1 06/16/2016 1005   MCH 32.6 04/23/2016 0626   MCHC 32.5 06/16/2016 1005   MCHC 34.9 04/23/2016 0626   RDW 13.3 06/16/2016 1005   LYMPHSABS 1.1 04/28/2016 1400   MONOABS 0.7 04/23/2016 0626   EOSABS 0.1 04/28/2016 1400   BASOSABS 0.0 04/28/2016 1400       09/29/2015 EGD/Colonoscopy: EGD; Impression: - Normal esophagus. - Z-line irregular, 32 cm from the incisors. - 4 cm hiatal hernia.  - Non-bleeding erosive gastropathy. - Gastritis. Biopsied. - Normal duodenal bulb and second portion of the  duodenum.  Gastric biopsy negative for infection or allergy. Colonoscopy:    Impression: - Diverticulosis in the entire examined colon. - Internal hemorrhoids. - No specimens collected. comment: Suspect hematochezia secondary to  hemorrhoids. if patient experiences melena with drop in H&H will  consider small bowel evaluation.   Hx of atrial fib  and maintained on Warfarin.   HPI 01/13/2015 EGD: epigastric pain, nausea and vomiting: Dr. Lajuan Lines. Pyrtle. The mucosa of the esophagus appeared normal. $cm hiatal hernia. The mucosa of the stomach appeared normal. The duodenal mucosa showed no abnormalities in the bulb and 2nd part of the duodenum.  Colonoscopy Dr. Maurene Capes 2 /06/2010:abdominal pain, anemia, rectal bleeding. internal hemorrhoids, moderate diverticulosis, sessile polyp in the sigmoid colon. Biopsy:Benign small bowel mucosa. No villous atrophy, inflammation or other abnormalities    Review of Systems     Objective:   Physical Exam Blood pressure 140/70, pulse 72, temperature 98.2 F (36.8 C), height 5\' 3"  (1.6 m), weight 133 lb 4.8 oz (60.5 kg).  Alert and oriented. Skin warm and dry. Oral mucosa is moist.   . Sclera anicteric, conjunctivae is pink. Thyroid not enlarged. No cervical lymphadenopathy. Lungs clear. Heart regular rate and rhythm.  Abdomen is soft. Bowel sounds are positive. No hepatomegaly. No abdominal masses felt. No tenderness.  No edema to lower extremities.          Assessment & Plan:  GERD controlled with Protonix. Hemorrhoids: voices no complaints today. This problem has resolved She seems to be doing well. OV in year.

## 2016-07-13 DIAGNOSIS — H5202 Hypermetropia, left eye: Secondary | ICD-10-CM | POA: Diagnosis not present

## 2016-07-13 DIAGNOSIS — H5211 Myopia, right eye: Secondary | ICD-10-CM | POA: Diagnosis not present

## 2016-07-13 DIAGNOSIS — H524 Presbyopia: Secondary | ICD-10-CM | POA: Diagnosis not present

## 2016-07-13 DIAGNOSIS — H52223 Regular astigmatism, bilateral: Secondary | ICD-10-CM | POA: Diagnosis not present

## 2016-07-14 ENCOUNTER — Telehealth: Payer: Self-pay | Admitting: Pharmacist

## 2016-07-14 ENCOUNTER — Ambulatory Visit: Payer: Medicare Other | Admitting: Cardiology

## 2016-07-14 NOTE — Telephone Encounter (Signed)
Patient is past due to recheck INR / Protime.  Tried to call but NA.  LM on VM for patient to call to have INR rechecked ASAP.

## 2016-08-06 ENCOUNTER — Encounter (INDEPENDENT_AMBULATORY_CARE_PROVIDER_SITE_OTHER): Payer: Self-pay | Admitting: Internal Medicine

## 2016-08-10 ENCOUNTER — Other Ambulatory Visit: Payer: Self-pay | Admitting: Family Medicine

## 2016-08-11 NOTE — Telephone Encounter (Signed)
Rx phoned in.   

## 2016-08-16 ENCOUNTER — Ambulatory Visit (INDEPENDENT_AMBULATORY_CARE_PROVIDER_SITE_OTHER): Payer: Medicare Other | Admitting: Family Medicine

## 2016-08-16 ENCOUNTER — Encounter: Payer: Self-pay | Admitting: Family Medicine

## 2016-08-16 VITALS — BP 154/74 | HR 63 | Temp 97.0°F | Ht 63.0 in | Wt 136.2 lb

## 2016-08-16 DIAGNOSIS — I10 Essential (primary) hypertension: Secondary | ICD-10-CM

## 2016-08-16 DIAGNOSIS — R300 Dysuria: Secondary | ICD-10-CM | POA: Diagnosis not present

## 2016-08-16 DIAGNOSIS — N3001 Acute cystitis with hematuria: Secondary | ICD-10-CM | POA: Diagnosis not present

## 2016-08-16 LAB — URINALYSIS, COMPLETE
BILIRUBIN UA: NEGATIVE
GLUCOSE, UA: NEGATIVE
Ketones, UA: NEGATIVE
Nitrite, UA: NEGATIVE
PROTEIN UA: NEGATIVE
Specific Gravity, UA: <1.005 — ABNORMAL LOW (ref 1.005–1.030)
UUROB: 0.2 mg/dL (ref 0.2–1.0)
pH, UA: 5 (ref 5.0–7.5)

## 2016-08-16 LAB — MICROSCOPIC EXAMINATION
Epithelial Cells (non renal): >10 /hpf — AB (ref 0–10)
Renal Epithel, UA: NONE SEEN /hpf

## 2016-08-16 MED ORDER — NITROFURANTOIN MONOHYD MACRO 100 MG PO CAPS: 100.0000 mg | ORAL_CAPSULE | Freq: Two times a day (BID) | ORAL | 0 refills | Status: DC

## 2016-08-16 MED ORDER — IRBESARTAN 75 MG PO TABS: 75.0000 mg | ORAL_TABLET | Freq: Every day | ORAL | 3 refills | Status: DC

## 2016-08-16 NOTE — Patient Instructions (Signed)
Great to see you!  Come back in about 1 month to follow up for high blood pressure  I have changed lisinopril to irbesartan.

## 2016-08-16 NOTE — Progress Notes (Signed)
    HPI  Patient presents today Here with symptoms of UTI.  Patient states that she's had waxing waning symptoms of UTI including foul-smelling urine and urinary hesitancy over the last 1-2 weeks. She also complains of abdominal soreness and low back pain over the last day causing her to come in. She denies fever, chills, sweats. She's tolerating food and fluid like usual.  Her blood pressure at home ranges low 100s on it to 160s at times. She has good medication compliance She does have a cough which may be attributed to lisinopril.  PMH: Smoking status noted ROS: Per HPI  Objective: BP (!) 154/74   Pulse 63   Temp (!) 97 F (36.1 C) (Oral)   Ht 5\' 3"  (1.6 m)   Wt 136 lb 3.2 oz (61.8 kg)   BMI 24.13 kg/m  Gen: NAD, alert, cooperative with exam HEENT: NCAT CV: RRR, good S1/S2 Resp: CTABL, no wheezes, non-labored Abd: Mild tenderness to palpation bilateral lower quadrants and suprapubic area, no CVA tenderness Ext: No edema, warm Neuro: Alert and oriented, No gross deficits Ostial skeletal No tenderness to palpation of bilateral paraspinal muscles and lumbar area  Assessment and plan:  # UTI Symptoms with mild findings on urinalysis Treat with Cipro Culture RTC if worsening or not resolving  # HTN Elevated, pt reports labile pressure Log, RTC in 1 month Change to 75 mg irbesartan, with possible Ace inhibitor cough    Orders Placed This Encounter  Procedures  . Urinalysis, Complete    Meds ordered this encounter  Medications  . irbesartan (AVAPRO) 75 MG tablet    Sig: Take 1 tablet (75 mg total) by mouth daily.    Dispense:  30 tablet    Refill:  3    Please dc lisinopril- replacing with ARB    Laroy Apple, MD [facility] 08/16/2016, 8:47 AM

## 2016-08-18 ENCOUNTER — Ambulatory Visit (INDEPENDENT_AMBULATORY_CARE_PROVIDER_SITE_OTHER): Payer: Medicare Other | Admitting: Pharmacist

## 2016-08-18 DIAGNOSIS — I480 Paroxysmal atrial fibrillation: Secondary | ICD-10-CM | POA: Diagnosis not present

## 2016-08-18 LAB — URINE CULTURE

## 2016-08-18 LAB — COAGUCHEK XS/INR WAIVED
INR: 2.3 — AB (ref 0.9–1.1)
Prothrombin Time: 27.4 s

## 2016-08-18 NOTE — Patient Instructions (Signed)
Anticoagulation Warfarin Dose Instructions as of 08/18/2016      [name] Tue Wed Thu Fri Sat   New Dose 5 mg 2.5 mg 5 mg 5 mg 5 mg 2.5 mg 5 mg    Description   Continue current warfarin 5mg  dose - take 1/2 tablet on Mondays and Fridays. Take 1 tablet all other days  INR was 2.3 today

## 2016-08-23 ENCOUNTER — Other Ambulatory Visit: Payer: Self-pay | Admitting: Family Medicine

## 2016-08-24 NOTE — Telephone Encounter (Signed)
Will ask nursing to investigate why she wants refill of macrobid.   Laroy Apple, MD [facility] 08/24/2016, 9:34 AM

## 2016-08-24 NOTE — Telephone Encounter (Signed)
[id number]-cb 08/21

## 2016-08-26 ENCOUNTER — Ambulatory Visit: Payer: Medicare Other

## 2016-09-03 ENCOUNTER — Other Ambulatory Visit: Payer: Self-pay | Admitting: Family Medicine

## 2016-09-03 ENCOUNTER — Telehealth: Payer: Self-pay | Admitting: Family Medicine

## 2016-09-03 ENCOUNTER — Other Ambulatory Visit: Payer: Medicare Other

## 2016-09-03 ENCOUNTER — Telehealth: Payer: Self-pay

## 2016-09-03 DIAGNOSIS — R300 Dysuria: Secondary | ICD-10-CM

## 2016-09-03 LAB — URINALYSIS, COMPLETE
BILIRUBIN UA: NEGATIVE
Glucose, UA: NEGATIVE
Ketones, UA: NEGATIVE
NITRITE UA: NEGATIVE
PH UA: 6.5 (ref 5.0–7.5)
Protein, UA: NEGATIVE
Specific Gravity, UA: 1.01 (ref 1.005–1.030)
UUROB: 0.2 mg/dL (ref 0.2–1.0)

## 2016-09-03 LAB — MICROSCOPIC EXAMINATION

## 2016-09-03 NOTE — Telephone Encounter (Signed)
Patient aware and abx called into [facility]. Per Jackelyn Poling

## 2016-09-03 NOTE — Telephone Encounter (Signed)
Results are slightly improved, sent for a culture to be sure it is cleared.   Laroy Apple, MD [facility] 09/03/2016, 4:10 PM

## 2016-09-03 NOTE — Telephone Encounter (Signed)
See earlier phone note  Laroy Apple, MD [facility] 09/03/2016, 5:13 PM

## 2016-09-03 NOTE — Telephone Encounter (Signed)
Patient was seen by you a couple of weeks ago for cystitis and treated with Cipro.  She came back in today and left another urine and would like to know the results.  Please advise.

## 2016-09-03 NOTE — Progress Notes (Signed)
Pt dropping off urine, UA mildly abnormal Culture.   Laroy Apple, MD [facility] 09/03/2016, 12:38 PM

## 2016-09-03 NOTE — Telephone Encounter (Addendum)
If pt is symptomatic would be ok with Tx with keflex 500 TID, #21 (7 days Tx).   She did not answer on attempt #1, VM left to call back.   Laroy Apple, MD [facility] 09/03/2016, 5:15 PM

## 2016-09-05 LAB — URINE CULTURE

## 2016-09-07 DIAGNOSIS — H02209 Unspecified lagophthalmos unspecified eye, unspecified eyelid: Secondary | ICD-10-CM | POA: Diagnosis not present

## 2016-09-07 DIAGNOSIS — H00029 Hordeolum internum unspecified eye, unspecified eyelid: Secondary | ICD-10-CM | POA: Diagnosis not present

## 2016-09-07 DIAGNOSIS — Z961 Presence of intraocular lens: Secondary | ICD-10-CM | POA: Diagnosis not present

## 2016-09-16 ENCOUNTER — Other Ambulatory Visit: Payer: Self-pay | Admitting: Family Medicine

## 2016-09-17 NOTE — Telephone Encounter (Signed)
Ambein refill called to pharmacy. 

## 2016-09-17 NOTE — Telephone Encounter (Signed)
refilled Tera Helper, MD [facility] 09/17/2016, 11:52 AM

## 2016-09-17 NOTE — Telephone Encounter (Signed)
Last seen 08/16/16  Dr Wendi Snipes  IF approved route to nurse to call into [facility]

## 2016-09-20 ENCOUNTER — Encounter: Payer: Self-pay | Admitting: *Deleted

## 2016-09-20 ENCOUNTER — Ambulatory Visit (INDEPENDENT_AMBULATORY_CARE_PROVIDER_SITE_OTHER): Payer: Medicare Other | Admitting: *Deleted

## 2016-09-20 VITALS — BP 123/73 | HR 64 | Ht 60.25 in | Wt 135.0 lb

## 2016-09-20 DIAGNOSIS — I480 Paroxysmal atrial fibrillation: Secondary | ICD-10-CM

## 2016-09-20 DIAGNOSIS — Z0000 Encounter for general adult medical examination without abnormal findings: Secondary | ICD-10-CM

## 2016-09-20 LAB — COAGUCHEK XS/INR WAIVED
INR: 4.3 — ABNORMAL HIGH (ref 0.9–1.1)
PROTHROMBIN TIME: 51.2 s

## 2016-09-20 NOTE — Patient Instructions (Addendum)
  [name] , Thank you for taking time to come for your Medicare Wellness Visit. I appreciate your ongoing commitment to your health goals. Please review the following plan we discussed and let me know if I can assist you in the future.   These are the goals we discussed: Goals    . Exercise 150 minutes per week (moderate activity)       This is a list of the screening recommended for you and due dates:  Health Maintenance  Topic Date Due  . Tetanus Vaccine  03/03/1951  . Pneumonia vaccines (2 of 2 - PPSV23) 02/20/2016  . Flu Shot  08/04/2016  . DEXA scan (bone density measurement)  12/15/2016    Review Advance Directives and bring a signed/notarized copy to our office.  Flu shots will be available at the end of September.  Keep your follow up appt with Dr Wendi Snipes. Continue to stay active and move carefully to avoid falls.   Anticoagulation Warfarin Dose Instructions as of 09/20/2016      [name] Tue Wed Thu Fri Sat   New Dose 5 mg 0 mg 5 mg 5 mg 5 mg 2.5 mg 5 mg    Description   Hold Coumadin today and resume normal schedule tomorrow. 5mg  dose - take 1/2 tablet on Mondays and Fridays. Take 1 tablet all other days  INR was 4.3 today which was too thin.

## 2016-09-20 NOTE — Progress Notes (Signed)
Subjective:   [name] is a 83 y.o. female who presents for a subsequent Medicare Annual Wellness Visit. [name] lives at home with her husband. She is a retired [employer] manager. She is active daily and uses her stationary bike and rowing machine for about 5 minutes 3 times a day.   Review of Systems    Reports that her health is about the same as last year.   Cardiac Risk Factors include: advanced age (>51men, >62 women);hypertension;family history of premature cardiovascular disease   Urinary: Nocturia and some incontinence. Wears pads.   HEENT: Chronic watering of left eye. Followed by eye doctor.   Other systems negative today.      Objective:    Today's Vitals   09/20/16 1127  BP: 123/73  Pulse: 64  Weight: 135 lb (61.2 kg)  Height: 5' 0.25" (1.53 m)   Body mass index is 26.15 kg/m.   Current Medications (verified) Outpatient Encounter Prescriptions as of 09/20/2016  Medication Sig  . atenolol (TENORMIN) 50 MG tablet Take 50 mg by mouth daily.  Marland Kitchen atorvastatin (LIPITOR) 20 MG tablet Take 10 mg by mouth at bedtime.   . flecainide (TAMBOCOR) 100 MG tablet Take 0.5 tablets (50 mg total) by mouth 2 (two) times daily.  . furosemide (LASIX) 40 MG tablet Take 20 mg by mouth daily. Take 1/2 tablet by mouth in the morning and take one tablet by mouth if swelling occurs  . irbesartan (AVAPRO) 75 MG tablet Take 1 tablet (75 mg total) by mouth daily.  Marland Kitchen levETIRAcetam (KEPPRA) 500 MG tablet Take 250 mg by mouth 2 (two) times daily.  Marland Kitchen levothyroxine (SYNTHROID, LEVOTHROID) 50 MCG tablet Take 50 mcg by mouth daily before breakfast.   . pantoprazole (PROTONIX) 40 MG tablet Take 1 tablet (40 mg total) by mouth daily.  . potassium chloride SA (K-DUR,KLOR-CON) 20 MEQ tablet Take 20 mEq by mouth daily.  Marland Kitchen warfarin (COUMADIN) 5 MG tablet Take 1/2 tablet on mondays and fridays. Take 1 tablet all other days.  Marland Kitchen zolpidem (AMBIEN) 5 MG tablet TAKE 1/2 TO 1 TABLET AT BEDTIME AS NEEDED FOR  INSOMNIA  . [DISCONTINUED] nitrofurantoin, macrocrystal-monohydrate, (MACROBID) 100 MG capsule Take 1 capsule (100 mg total) by mouth 2 (two) times daily. 1 po BId   No facility-administered encounter medications on file as of 09/20/2016.     Allergies (verified) Penicillins   History: Past Medical History:  Diagnosis Date  . Anemia   . Atrial fibrillation (HCC)    Paroxysmal, Flecainide therapy  . Calf pain    September, 2012, at rest  . Cardiomegaly    EF normal, echo, 2003  //   EF 60-65%, echo, June 21, 621, diastolic dysfunction,  . Carotid bruit    Doppler, December, 2009, no abnormality  . Cataract   . Diverticulosis   . GERD (gastroesophageal reflux disease)   . History of shingles   . HLD (hyperlipidemia)   . HTN (hypertension)   . Hypothyroidism   . Hypothyroidism   . Insomnia   . Normal nuclear stress test    Normal, 2001  . Osteoarthritis   . PONV (postoperative nausea and vomiting)   . Rectal bleeding 2001   diverticulosis and int hemorrhoids on 07/1999 and 02/2010 colonoscopies.  . Stroke ([place])   . Warfarin anticoagulation    Past Surgical History:  Procedure Laterality Date  . BIOPSY  09/29/2015   Procedure: BIOPSY;  Surgeon: Rogene Houston, MD;  [facility];  Service: Endoscopy;;  gastric  . CATARACT EXTRACTION W/PHACO Right 04/15/2014   Procedure: CATARACT EXTRACTION PHACO AND INTRAOCULAR LENS PLACEMENT (IOC);  Surgeon: Tonny Branch, MD;  Location: [facility];  Service: Ophthalmology;  Laterality: Right;  CDE: 13.30  . CATARACT EXTRACTION W/PHACO Left 05/13/2014   Procedure: CATARACT EXTRACTION PHACO AND INTRAOCULAR LENS PLACEMENT LEFT EYE;  Surgeon: Tonny Branch, MD;  Location: [facility];  Service: Ophthalmology;  Laterality: Left;  CDE:13.00  . CHOLECYSTECTOMY N/A 01/15/2015   Procedure: LAPAROSCOPIC CHOLECYSTECTOMY;  Surgeon: Ralene Ok, MD;  Location: [facility];  Service: General;  Laterality: N/A;  . COLONOSCOPY N/A 09/29/2015   Procedure:  COLONOSCOPY;  Surgeon: Rogene Houston, MD;  [facility];  Service: Endoscopy;  Laterality: N/A;  . COLONOSCOPY    . ESOPHAGOGASTRODUODENOSCOPY N/A 01/13/2015   Procedure: ESOPHAGOGASTRODUODENOSCOPY (EGD);  Surgeon: Jerene Bears, MD;  Location: [facility];  Service: Endoscopy;  Laterality: N/A;  . ESOPHAGOGASTRODUODENOSCOPY N/A 09/29/2015   Procedure: ESOPHAGOGASTRODUODENOSCOPY (EGD);  Surgeon: Rogene Houston, MD;  [facility];  Service: Endoscopy;  Laterality: N/A;  12:15  . GALLBLADDER SURGERY    . LEFT HEART CATH AND CORONARY ANGIOGRAPHY N/A 06/22/2016   Procedure: Left Heart Cath and Coronary Angiography;  Surgeon: Troy Sine, MD;  Location: [facility];  Service: Cardiovascular;  Laterality: N/A;  . SHOULDER OPEN ROTATOR CUFF REPAIR     bilateral  . TOTAL KNEE ARTHROPLASTY  05/26/10   right  . TOTAL KNEE ARTHROPLASTY  11/17/2010   Procedure: TOTAL KNEE ARTHROPLASTY;  Surgeon: Mauri Pole;  Location: [facility];  Service: Orthopedics;  Laterality: Left;  . TOTAL KNEE ARTHROPLASTY     Right  . TOTAL SHOULDER REPLACEMENT  01/2010   left   Family History  Problem Relation Age of Onset  . COPD Brother   . Atrial fibrillation Brother   . Diabetes Brother   . Liver cancer Brother        Alcohol-related  . Colon cancer Brother   . Heart attack Father   . Early death Father   . Rheum arthritis Mother   . Arthritis Mother   . Heart disease Mother   . Coronary artery disease Brother   . Heart attack Daughter   . Gallbladder disease Brother    Social History   Occupational History  . Not on file.   Social History Main Topics  . Smoking status: Never Smoker  . Smokeless tobacco: Never Used  . Alcohol use No  . Drug use: No  . Sexual activity: No    Tobacco Counseling No tobacco use  Activities of Daily Living In your present state of health, do you have any difficulty performing the following activities: 09/20/2016 06/22/2016  Hearing? Y N    Comment Interested in hearing aid but is waiting to be able to afford one -  Vision? N N  Difficulty concentrating or making decisions? Y N  Comment Has noticed that her memory is declining  -  Walking or climbing stairs? N Y  Comment Steps into basement but she doesn't have to use them often.  -  Dressing or bathing? N N  Doing errands, shopping? N -  Preparing Food and eating ? N -  Using the Toilet? N -  In the past six months, have you accidently leaked urine? N -  Do you have problems with loss of bowel control? Y -  Managing your Medications? N -  Managing your Finances? N -  Housekeeping or managing your Housekeeping? N -  Some recent data might be hidden    Immunizations and Health Maintenance Immunization History  Administered Date(s) Administered  . Influenza, High Dose Seasonal PF 10/06/2015  . Influenza-Unspecified 10/04/2014  . Pneumococcal Conjugate-13 02/20/2015   Health Maintenance Due  Topic Date Due  . TETANUS/TDAP  03/03/1951  . PNA vac Low Risk Adult (2 of 2 - PPSV23) 02/20/2016  . INFLUENZA VACCINE  08/04/2016    Patient Care Team: Timmothy Euler, MD as PCP - General (Family Medicine) Paralee Cancel, MD (Orthopedic Surgery) Minus Breeding, MD as Consulting Physician (Cardiology) Wylene Simmer, MD as Consulting Physician (Orthopedic Surgery)  No ER visits or hospitalizations. Had heart procedure 06/22/16 at [facility].      Assessment:   This is a routine wellness examination for Chenise.   Hearing/Vision screen No deficits noted during visit today. Last eye exam was a few months ago.   Dietary issues and exercise activities discussed: Current Exercise Habits: Home exercise routine, Type of exercise: Other - see comments (rower and stationary bike), Time (Minutes): 15, Frequency (Times/Week): 7, Weekly Exercise (Minutes/Week): 105, Intensity: Moderate, Exercise limited by: None identified  Goals    . Exercise 150 minutes per week (moderate activity)       Depression Screen PHQ 2/9 Scores 09/20/2016 08/16/2016 05/26/2016 05/24/2016 05/10/2016 05/06/2016 04/28/2016  PHQ - 2 Score 0 0 5 2 2 2  0  PHQ- 9 Score - - 20 8 8 8  -    Fall Risk Fall Risk  09/20/2016 08/16/2016 05/26/2016 05/24/2016 05/10/2016  Falls in the past year? No No Yes Yes Yes  Number falls in past yr: - - 1 1 1   Injury with Fall? - - No No No  Comment - - - - -  Risk Factor Category  - - - - -  Follow up - - - - -   Uses a walking stick and is very cautious not to fall.   Cognitive Function: MMSE - Mini Mental State Exam 09/20/2016 12/16/2014  Orientation to time 4 4  Orientation to Place 5 5  Registration 3 3  Attention/ Calculation 5 3  Recall 0 3  Language- name 2 objects 2 2  Language- repeat 1 1  Language- follow 3 step command 3 3  Language- read & follow direction 1 0  Write a sentence 1 1  Copy design 0 0  Total score 25 25  Abnormal exam- My main concern is that not only did she not remember the 3 words to recall, initially she didn't remember me giving her 3 words at all. She didn't remember the conversation. She said "what 3 words".       Screening Tests Health Maintenance  Topic Date Due  . TETANUS/TDAP  03/03/1951  . PNA vac Low Risk Adult (2 of 2 - PPSV23) 02/20/2016  . INFLUENZA VACCINE  08/04/2016  . DEXA SCAN  12/15/2016      Plan:  Continue to use walking stick and move carefully to avoid falls.  Follow appt scheduled with Dr Wendi Snipes. Review MMSE.  Flu shot and pneumonia vaccine at next visit.  Handout on Advance Directives given and reviewed. Patient to bring back a signed/notarized copy.   Protime to be rechecked in 2 weeks at visit with Dr Wendi Snipes.   Anticoagulation Warfarin Dose Instructions as of 09/20/2016      [name] Tue Wed Thu Fri Sat   New Dose 5 mg 0 mg 5 mg 5 mg  5 mg 2.5 mg 5 mg    Description   Hold Coumadin today and resume normal schedule tomorrow. 5mg  dose - take 1/2 tablet on Mondays and Fridays. Take 1 tablet all other  days  INR was 4.3 today which was too thin.    (discussed anticoagulation plan with Dr Wendi Snipes)  I have personally reviewed and noted the following in the patient's chart:   . Medical and social history . Use of alcohol, tobacco or illicit drugs  . Current medications and supplements . Functional ability and status . Nutritional status . Physical activity . Advanced directives . List of other physicians . Hospitalizations, surgeries, and ER visits in previous 12 months . Vitals . Screenings to include cognitive, depression, and falls . Referrals and appointments  In addition, I have reviewed and discussed with patient certain preventive protocols, quality metrics, and best practice recommendations. A written personalized care plan for preventive services as well as general preventive health recommendations were provided to patient.     [name], RN  09/20/2016    I have reviewed and agree with the above AWV documentation.   Laroy Apple, MD [facility] 09/24/2016, 4:01 PM

## 2016-09-24 ENCOUNTER — Other Ambulatory Visit: Payer: Self-pay | Admitting: Family Medicine

## 2016-09-27 NOTE — Telephone Encounter (Signed)
Last lipid 04/22/15  Dr Wendi Snipes

## 2016-09-27 NOTE — Telephone Encounter (Signed)
Busy-cb 09/24

## 2016-09-27 NOTE — Telephone Encounter (Signed)
ll ask nursing to confirm dosing.   Dose has changed form 20 to 40 on this Rx.   Laroy Apple, MD [facility] 09/27/2016, 12:14 PM

## 2016-09-28 ENCOUNTER — Other Ambulatory Visit: Payer: Self-pay | Admitting: Family Medicine

## 2016-10-01 ENCOUNTER — Other Ambulatory Visit: Payer: Self-pay | Admitting: Family Medicine

## 2016-10-05 ENCOUNTER — Encounter: Payer: Self-pay | Admitting: Family Medicine

## 2016-10-05 ENCOUNTER — Ambulatory Visit: Payer: Medicare Other | Admitting: Family Medicine

## 2016-10-05 MED ORDER — ATORVASTATIN CALCIUM 20 MG PO TABS: 10.0000 mg | ORAL_TABLET | Freq: Every day | ORAL | 3 refills | Status: DC

## 2016-10-05 NOTE — Telephone Encounter (Signed)
Patient states that she is taking the 20mg  of atorvastatin.

## 2016-10-07 ENCOUNTER — Other Ambulatory Visit: Payer: Self-pay | Admitting: Family Medicine

## 2016-10-15 ENCOUNTER — Ambulatory Visit (INDEPENDENT_AMBULATORY_CARE_PROVIDER_SITE_OTHER): Payer: Medicare Other | Admitting: Family Medicine

## 2016-10-15 ENCOUNTER — Encounter: Payer: Self-pay | Admitting: Family Medicine

## 2016-10-15 VITALS — BP 170/77 | HR 77 | Temp 97.4°F | Ht 60.25 in | Wt 137.2 lb

## 2016-10-15 DIAGNOSIS — G3184 Mild cognitive impairment, so stated: Secondary | ICD-10-CM | POA: Diagnosis not present

## 2016-10-15 DIAGNOSIS — I10 Essential (primary) hypertension: Secondary | ICD-10-CM | POA: Diagnosis not present

## 2016-10-15 DIAGNOSIS — I4892 Unspecified atrial flutter: Secondary | ICD-10-CM

## 2016-10-15 DIAGNOSIS — Z7901 Long term (current) use of anticoagulants: Secondary | ICD-10-CM

## 2016-10-15 DIAGNOSIS — Z23 Encounter for immunization: Secondary | ICD-10-CM | POA: Diagnosis not present

## 2016-10-15 LAB — COAGUCHEK XS/INR WAIVED
INR: 1.1 (ref 0.9–1.1)
PROTHROMBIN TIME: 12.8 s

## 2016-10-15 MED ORDER — APIXABAN 2.5 MG PO TABS: 2.5000 mg | ORAL_TABLET | Freq: Two times a day (BID) | ORAL | 5 refills | Status: DC

## 2016-10-15 MED ORDER — IRBESARTAN 150 MG PO TABS: 150.0000 mg | ORAL_TABLET | Freq: Every day | ORAL | 3 refills | Status: DC

## 2016-10-15 NOTE — Progress Notes (Signed)
   HPI  Patient presents today here for follow-up hypertension, anticoagulation, and memory loss.  Patient has had memory loss for over year, she states it seems to be getting a little bit worse. She is doing well with her home activities and has no problems completing her ADLs.  Hypertension Reports good medication compliance, however she has been reducing doses of medications, however she's not sure which ones. No headache or chest pain.  Atrial flutter, anticoagulation No bleeding, she does have occasional hemorrhoid issues. She reports good Coumadin compliance.   PMH: Smoking status noted ROS: Per HPI  Objective: BP (!) 170/77   Pulse 77   Temp (!) 97.4 F (36.3 C) (Oral)   Ht 5' 0.25" (1.53 m)   Wt 137 lb 3.2 oz (62.2 kg)   BMI 26.57 kg/m  Gen: NAD, alert, cooperative with exam HEENT: NCAT, EOMI, PERRL CV: Irregularly irregular Resp: CTABL, no wheezes, non-labored Ext: No edema, warm Neuro: Alert and oriented, No gross deficits  MMSE - Mini Mental State Exam 09/20/2016 12/16/2014  Orientation to time 4 4  Orientation to Place 5 5  Registration 3 3  Attention/ Calculation 5 3  Recall 0 3  Language- name 2 objects 2 2  Language- repeat 1 1  Language- follow 3 step command 3 3  Language- read & follow direction 1 0  Write a sentence 1 1  Copy design 0 0  Total score 25 25     Assessment and plan:  # Mild cognitive impairment Diagnosis today, patient's MMSE score is stable over 2 years. She has had a CT scan showing chronic microvascular change in the interim. We discussed medications, we decided not to pursue any of them at this time  # Atrial flutter, anticoagulation Patient reports good medication compliance, however her INR is completely normal today. Changing to eliquis, 2.5 mg twice daily based on age over 34 years old and usual weight less than 60 kg, slightly more today. Rate controlled, no changes   # Hypertension Elevated today Increase  irbesartan 150 mg Continue atenolol plus Lasix Labs Follow-up one month   Need for influenza vaccine-counseling provided for all vaccine components     Orders Placed This Encounter  Procedures  . Flu vaccine HIGH DOSE PF  . CoaguChek XS/INR Waived  . CBC with Differential/Platelet  . CMP14+EGFR    Meds ordered this encounter  Medications  . apixaban (ELIQUIS) 2.5 MG TABS tablet    Sig: Take 1 tablet (2.5 mg total) by mouth 2 (two) times daily.    Dispense:  60 tablet    Refill:  5  . irbesartan (AVAPRO) 150 MG tablet    Sig: Take 1 tablet (150 mg total) by mouth daily.    Dispense:  30 tablet    Refill:  3    Please dc lisinopril- replacing with ARB    Laroy Apple, MD [facility] 10/15/2016, 5:20 PM

## 2016-10-15 NOTE — Patient Instructions (Signed)
Great to see you!  You can start eliquis tonight, take 1 pill twice daily.  Stop coumadin when you start eliquis.   If it is too expensive please leave it there and call me Monday and we can adjust your coumadin dosing.

## 2016-10-16 ENCOUNTER — Other Ambulatory Visit: Payer: Medicare Other

## 2016-10-17 LAB — CBC WITH DIFFERENTIAL/PLATELET
BASOS: 1 %
Basophils Absolute: 0.1 10*3/uL (ref 0.0–0.2)
EOS (ABSOLUTE): 0.1 10*3/uL (ref 0.0–0.4)
Eos: 1 %
Hematocrit: 39.4 % (ref 34.0–46.6)
Hemoglobin: 13.1 g/dL (ref 11.1–15.9)
IMMATURE GRANULOCYTES: 0 %
Immature Grans (Abs): 0 10*3/uL (ref 0.0–0.1)
Lymphocytes Absolute: 1.1 10*3/uL (ref 0.7–3.1)
Lymphs: 19 %
MCH: 30.7 pg (ref 26.6–33.0)
MCHC: 33.2 g/dL (ref 31.5–35.7)
MCV: 92 fL (ref 79–97)
MONOS ABS: 0.6 10*3/uL (ref 0.1–0.9)
Monocytes: 11 %
NEUTROS PCT: 68 %
Neutrophils Absolute: 3.7 10*3/uL (ref 1.4–7.0)
PLATELETS: 275 10*3/uL (ref 150–379)
RBC: 4.27 x10E6/uL (ref 3.77–5.28)
RDW: 14.3 % (ref 12.3–15.4)
WBC: 5.5 10*3/uL (ref 3.4–10.8)

## 2016-10-17 LAB — CMP14+EGFR
A/G RATIO: 1.7 (ref 1.2–2.2)
ALK PHOS: 80 IU/L (ref 39–117)
ALT: 6 IU/L (ref 0–32)
AST: 20 IU/L (ref 0–40)
Albumin: 4.3 g/dL (ref 3.5–4.7)
BILIRUBIN TOTAL: 0.8 mg/dL (ref 0.0–1.2)
BUN/Creatinine Ratio: 12 (ref 12–28)
BUN: 9 mg/dL (ref 8–27)
CALCIUM: 9.1 mg/dL (ref 8.7–10.3)
CHLORIDE: 95 mmol/L — AB (ref 96–106)
CO2: 23 mmol/L (ref 20–29)
Creatinine, Ser: 0.75 mg/dL (ref 0.57–1.00)
GFR calc Af Amer: 86 mL/min/{1.73_m2} (ref >59)
GFR calc non Af Amer: 74 mL/min/{1.73_m2} (ref >59)
Globulin, Total: 2.6 g/dL (ref 1.5–4.5)
Glucose: 92 mg/dL (ref 65–99)
POTASSIUM: 4.3 mmol/L (ref 3.5–5.2)
Sodium: 136 mmol/L (ref 134–144)
Total Protein: 6.9 g/dL (ref 6.0–8.5)

## 2016-10-18 ENCOUNTER — Encounter: Payer: Self-pay | Admitting: Family Medicine

## 2016-10-26 ENCOUNTER — Other Ambulatory Visit: Payer: Self-pay | Admitting: Family Medicine

## 2016-10-29 ENCOUNTER — Telehealth: Payer: Self-pay | Admitting: *Deleted

## 2016-10-29 NOTE — Telephone Encounter (Signed)
Requesting refill of Levetiracetam Never refilled by [facility] No dosing info given Please review and advise Last seen 01/16/2016

## 2016-11-01 ENCOUNTER — Ambulatory Visit (INDEPENDENT_AMBULATORY_CARE_PROVIDER_SITE_OTHER): Payer: Medicare Other | Admitting: Family

## 2016-11-01 ENCOUNTER — Encounter: Payer: Self-pay | Admitting: Family

## 2016-11-01 VITALS — BP 166/94 | HR 68 | Temp 97.9°F | Ht 60.25 in | Wt 138.0 lb

## 2016-11-01 DIAGNOSIS — N3001 Acute cystitis with hematuria: Secondary | ICD-10-CM

## 2016-11-01 DIAGNOSIS — R410 Disorientation, unspecified: Secondary | ICD-10-CM | POA: Diagnosis not present

## 2016-11-01 LAB — URINALYSIS, COMPLETE
Bilirubin, UA: NEGATIVE
GLUCOSE, UA: NEGATIVE
KETONES UA: NEGATIVE
Leukocytes, UA: NEGATIVE
NITRITE UA: NEGATIVE
PH UA: 5 (ref 5.0–7.5)
Protein, UA: NEGATIVE
Specific Gravity, UA: <1.005 — ABNORMAL LOW (ref 1.005–1.030)
UUROB: 0.2 mg/dL (ref 0.2–1.0)

## 2016-11-01 LAB — MICROSCOPIC EXAMINATION: Renal Epithel, UA: NONE SEEN /hpf

## 2016-11-01 MED ORDER — CIPROFLOXACIN HCL 500 MG PO TABS: 500.0000 mg | ORAL_TABLET | Freq: Two times a day (BID) | ORAL | 0 refills | Status: DC

## 2016-11-01 NOTE — Telephone Encounter (Signed)
Patient states she is not feel good atm and will call us back to let us know the correct dosages of Keppra before sending it.

## 2016-11-01 NOTE — Progress Notes (Signed)
   Subjective:    Patient ID: [name], female    DOB: 1932-08-30, 83 y.o.   MRN: [id number]  Altered Mental Status  This is a new problem. The current episode started today. The problem occurs intermittently. The problem has been waxing and waning. Pertinent negatives include no change in bowel habit, chills, coughing, fever, nausea, sore throat or vomiting. Nothing aggravates the symptoms. She has tried nothing for the symptoms. The treatment provided no relief.      Review of Systems  Constitutional: Negative for chills and fever.  HENT: Negative for sore throat.   Respiratory: Negative for cough.   Gastrointestinal: Negative for change in bowel habit, nausea and vomiting.  All other systems reviewed and are negative.      Objective:   Physical Exam  Constitutional: She is oriented to person, place, and time. She appears well-developed and well-nourished. No distress.  A&O X 4  HENT:  Head: Normocephalic and atraumatic.  Right Ear: External ear normal.  Left Ear: External ear normal.  Nose: Nose normal.  Mouth/Throat: Oropharynx is clear and moist.  Eyes: Pupils are equal, round, and reactive to light.  Cardiovascular: Normal rate, regular rhythm, normal heart sounds and intact distal pulses.   No murmur heard. Pulmonary/Chest: Effort normal and breath sounds normal. No respiratory distress. She has no wheezes.  Abdominal: Soft. Bowel sounds are normal. She exhibits no distension. There is no tenderness.  Musculoskeletal: Normal range of motion. She exhibits no edema or tenderness.  Neurological: She is alert and oriented to person, place, and time.  Skin: Skin is warm and dry.  Psychiatric: She has a normal mood and affect. Her behavior is normal. Judgment and thought content normal.  Vitals reviewed.    Temp 97.9 F (36.6 C) (Oral)   Ht 5' 0.25" (1.53 m)   Wt 138 lb (62.6 kg)   BMI 26.73 kg/m      Assessment & Plan:  1. Confusion - Urinalysis, Complete -  Urine Culture  2. Acute cystitis with hematuria Force fluids RTO prn Culture pending - ciprofloxacin (CIPRO) 500 MG tablet; Take 1 tablet (500 mg total) by mouth 2 (two) times daily.  Dispense: 14 tablet; Refill: 0   [name], FNP

## 2016-11-01 NOTE — Patient Instructions (Addendum)

## 2016-11-01 NOTE — Telephone Encounter (Signed)
[place] with refill of keppra X 6 months. Please confirm dosingand recent dis[pensing with pharmacy.  Laroy Apple, MD [facility] 11/01/2016, 1:26 PM

## 2016-11-03 ENCOUNTER — Encounter (INDEPENDENT_AMBULATORY_CARE_PROVIDER_SITE_OTHER): Payer: Self-pay | Admitting: Internal Medicine

## 2016-11-03 ENCOUNTER — Ambulatory Visit (INDEPENDENT_AMBULATORY_CARE_PROVIDER_SITE_OTHER): Payer: Medicare Other | Admitting: Internal Medicine

## 2016-11-03 VITALS — BP 120/78 | HR 56 | Temp 98.0°F | Ht 62.0 in | Wt 139.7 lb

## 2016-11-03 DIAGNOSIS — R197 Diarrhea, unspecified: Secondary | ICD-10-CM | POA: Diagnosis not present

## 2016-11-03 NOTE — Progress Notes (Signed)
Subjective:    Patient ID: [name], female    DOB: 08-Jan-1932, 83 y.o.   MRN: [id number]  HPI Presents today with c/o diarrhea. She says she has Imodium about 2-3 times a weeks.  The last episode was this morning.  Stools was mushy this morning. No BM since this am. She says she took 4 Imodium this morning.  She did not have diarrhea yesterday. Stool was normal yesterday. Appetite has been okay. No weight loss. No abdominal pain.    She is taking Cipro at present for a UTI.   Hx of a fib and CVA and maintained on Warfarin.  09/29/2015 EGD/Colonoscopy: Melena and rectal bleeding.  EGD; Impression: - Normal esophagus. - Z-line irregular, 32 cm from the incisors. - 4 cm hiatal hernia.  - Non-bleeding erosive gastropathy. - Gastritis. Biopsied. - Normal duodenal bulb and second portion of the  duodenum.  Gastric biopsy negative for infection or allergy. Colonoscopy:  Impression: - Diverticulosis in the entire examined colon. - Internal hemorrhoids. - No specimens collected. comment: Suspect hematochezia secondary to  hemorrhoids. if patient experiences melena with drop in H&H will  consider small bowel evaluation.   Hx of atrial fib and maintained on Warfarin.   HPI 01/13/2015 EGD: epigastric pain, nausea and vomiting: Dr. Lajuan Lines. Pyrtle. The mucosa of the esophagus appeared normal. $cm hiatal hernia. The mucosa of the stomach appeared normal. The duodenal mucosa showed no abnormalities in the bulb and 2nd part of the duodenum.  Colonoscopy Dr. Maurene Capes 2 /06/2010:abdominal pain, anemia, rectal bleeding. internal hemorrhoids, moderate  diverticulosis, sessile polyp in the sigmoid colon. Biopsy:Benign small bowel mucosa. No villous atrophy, inflammation or other abnormalities   Review of Systems Past Medical History:  Diagnosis Date  . Anemia   . Atrial fibrillation (HCC)    Paroxysmal, Flecainide therapy  . Calf pain    September, 2012, at rest  . Cardiomegaly    EF normal, echo, 2003  //   EF 60-65%, echo, June 22, 5850, diastolic dysfunction,  . Carotid bruit    Doppler, December, 2009, no abnormality  . Cataract   . Diverticulosis   . GERD (gastroesophageal reflux disease)   . History of shingles   . HLD (hyperlipidemia)   . HTN (hypertension)   . Hypothyroidism   . Hypothyroidism   . Insomnia   . Normal nuclear stress test    Normal, 2001  . Osteoarthritis   . PONV (postoperative nausea and vomiting)   . Rectal bleeding 2001   diverticulosis and int hemorrhoids on 07/1999 and 02/2010 colonoscopies.  . Stroke ([place])   . Warfarin anticoagulation     Past Surgical History:  Procedure Laterality Date  . BIOPSY  09/29/2015   Procedure: BIOPSY;  Surgeon: Rogene Houston, MD;  [facility];  Service: Endoscopy;;  gastric  . CATARACT EXTRACTION W/PHACO Right 04/15/2014   Procedure: CATARACT EXTRACTION PHACO AND INTRAOCULAR LENS PLACEMENT (IOC);  Surgeon: Tonny Branch, MD;  Location: [facility];  Service: Ophthalmology;  Laterality: Right;  CDE: 13.30  . CATARACT EXTRACTION W/PHACO Left 05/13/2014   Procedure: CATARACT EXTRACTION PHACO AND INTRAOCULAR LENS PLACEMENT LEFT EYE;  Surgeon: Tonny Branch, MD;  Location: [facility];  Service: Ophthalmology;  Laterality: Left;  CDE:13.00  . CHOLECYSTECTOMY N/A 01/15/2015   Procedure: LAPAROSCOPIC CHOLECYSTECTOMY;  Surgeon: Ralene Ok, MD;  Location: [place];  Service: General;  Laterality: N/A;  . COLONOSCOPY N/A 09/29/2015   Procedure: COLONOSCOPY;  Surgeon: Rogene Houston, MD;  [facility];  Service: Endoscopy;  Laterality: N/A;  . COLONOSCOPY    .  ESOPHAGOGASTRODUODENOSCOPY N/A 01/13/2015   Procedure: ESOPHAGOGASTRODUODENOSCOPY (EGD);  Surgeon: Jerene Bears, MD;  Location: [facility] ENDOSCOPY;  Service: Endoscopy;  Laterality: N/A;  . ESOPHAGOGASTRODUODENOSCOPY N/A 09/29/2015   Procedure: ESOPHAGOGASTRODUODENOSCOPY (EGD);  Surgeon: Rogene Houston, MD;  [facility];  Service: Endoscopy;  Laterality: N/A;  12:15  . GALLBLADDER SURGERY    . LEFT HEART CATH AND CORONARY ANGIOGRAPHY N/A 06/22/2016   Procedure: Left Heart Cath and Coronary Angiography;  Surgeon: Troy Sine, MD;  Location: [place] [facility];  Service: Cardiovascular;  Laterality: N/A;  . SHOULDER OPEN ROTATOR CUFF REPAIR     bilateral  . TOTAL KNEE ARTHROPLASTY  05/26/10   right  . TOTAL KNEE ARTHROPLASTY  11/17/2010   Procedure: TOTAL KNEE ARTHROPLASTY;  Surgeon: Mauri Pole;  Location: [facility];  Service: Orthopedics;  Laterality: Left;  . TOTAL KNEE ARTHROPLASTY     Right  . TOTAL SHOULDER REPLACEMENT  01/2010   left    Allergies  Allergen Reactions  . Penicillins Rash and Other (See Comments)    Has patient had a PCN reaction causing immediate rash, facial/tongue/throat swelling, SOB or lightheadedness with hypotension: No Has patient had a PCN reaction causing severe rash involving mucus membranes or skin necrosis: No Has patient had a PCN reaction that required hospitalization No Has patient had a PCN reaction occurring within the last 10 years: No If all of the above answers are "NO", then may proceed with Cephalosporin use.    Current Outpatient Prescriptions on File Prior to Visit  Medication Sig Dispense Refill  . atenolol (TENORMIN) 50 MG tablet Take 50 mg by mouth daily.    Marland Kitchen atorvastatin (LIPITOR) 20 MG tablet TAKE 1 TABLET BY MOUTH AT  BEDTIME 90 tablet 1  . ciprofloxacin (CIPRO) 500 MG tablet Take 1 tablet (500 mg total) by mouth 2 (two) times daily. 14 tablet 0  . flecainide (TAMBOCOR) 100 MG tablet Take 0.5 tablets (50 mg total) by mouth 2  (two) times daily.    . furosemide (LASIX) 40 MG tablet TAKE 1/2 TABLET BY MOUTH  EVERY MORNING , TAKE 1  TABLET BY MOUTH IF SWELLING OCCURS 90 tablet 0  . irbesartan (AVAPRO) 150 MG tablet Take 1 tablet (150 mg total) by mouth daily. 30 tablet 3  . levETIRAcetam (KEPPRA) 500 MG tablet Take 250 mg by mouth 2 (two) times daily.    Marland Kitchen levothyroxine (SYNTHROID, LEVOTHROID) 50 MCG tablet Take 50 mcg by mouth daily before breakfast.     . pantoprazole (PROTONIX) 40 MG tablet TAKE 1 TABLET DAILY 90 tablet 0  . potassium chloride SA (K-DUR,KLOR-CON) 20 MEQ tablet Take 20 mEq by mouth daily.    Marland Kitchen zolpidem (AMBIEN) 5 MG tablet TAKE 1/2 TO 1 TABLET AT BEDTIME AS NEEDED FOR INSOMNIA 30 tablet 2   No current facility-administered medications on file prior to visit.                Objective:   Physical Exam Blood pressure 120/78, pulse (!) 56, temperature 98 F (36.7 C), height 5\' 2"  (1.575 m), weight 139 lb 11.2 oz (63.4 kg). Alert and oriented. Skin warm and dry. Oral mucosa is moist.   . Sclera anicteric, conjunctivae is pink. Thyroid not enlarged. No cervical lymphadenopathy. Lungs clear. Heart regular rate and rhythm.  Abdomen is soft. Bowel sounds are positive. No hepatomegaly. No abdominal masses  felt. No tenderness.  No edema to lower extremities.           Assessment & Plan:  Diarrhea, She occasually has about 2-3 times a weeks. I will give her samples of IBGARD. Her colonoscopy is up to date. She has had episodic diarrhea for years.

## 2016-11-03 NOTE — Patient Instructions (Signed)
Samples of IBGARD given to patient. OV 1 year.

## 2016-11-04 LAB — URINE CULTURE

## 2016-11-11 ENCOUNTER — Other Ambulatory Visit: Payer: Self-pay | Admitting: Family Medicine

## 2016-11-12 MED ORDER — LEVETIRACETAM 250 MG PO TABS: 250.0000 mg | ORAL_TABLET | Freq: Two times a day (BID) | ORAL | 2 refills | Status: DC

## 2016-11-13 ENCOUNTER — Encounter (HOSPITAL_COMMUNITY): Payer: Self-pay | Admitting: *Deleted

## 2016-11-13 ENCOUNTER — Other Ambulatory Visit: Payer: Self-pay

## 2016-11-13 ENCOUNTER — Observation Stay (HOSPITAL_COMMUNITY)
Admission: EM | Admit: 2016-11-13 | Discharge: 2016-11-13 | Disposition: A | Discharge status: Home / Self Care | Payer: Medicare Other | Attending: Internal Medicine | Admitting: Internal Medicine

## 2016-11-13 DIAGNOSIS — R1010 Upper abdominal pain, unspecified: Secondary | ICD-10-CM | POA: Insufficient documentation

## 2016-11-13 DIAGNOSIS — R51 Headache: Secondary | ICD-10-CM | POA: Insufficient documentation

## 2016-11-13 DIAGNOSIS — D649 Anemia, unspecified: Secondary | ICD-10-CM | POA: Diagnosis not present

## 2016-11-13 DIAGNOSIS — I110 Hypertensive heart disease with heart failure: Secondary | ICD-10-CM | POA: Insufficient documentation

## 2016-11-13 DIAGNOSIS — I480 Paroxysmal atrial fibrillation: Secondary | ICD-10-CM | POA: Insufficient documentation

## 2016-11-13 DIAGNOSIS — Z7901 Long term (current) use of anticoagulants: Secondary | ICD-10-CM | POA: Diagnosis not present

## 2016-11-13 DIAGNOSIS — E871 Hypo-osmolality and hyponatremia: Secondary | ICD-10-CM | POA: Diagnosis not present

## 2016-11-13 DIAGNOSIS — K625 Hemorrhage of anus and rectum: Secondary | ICD-10-CM | POA: Diagnosis not present

## 2016-11-13 DIAGNOSIS — E039 Hypothyroidism, unspecified: Secondary | ICD-10-CM | POA: Diagnosis not present

## 2016-11-13 DIAGNOSIS — Z79899 Other long term (current) drug therapy: Secondary | ICD-10-CM | POA: Insufficient documentation

## 2016-11-13 DIAGNOSIS — I10 Essential (primary) hypertension: Secondary | ICD-10-CM | POA: Diagnosis present

## 2016-11-13 DIAGNOSIS — I509 Heart failure, unspecified: Secondary | ICD-10-CM | POA: Diagnosis not present

## 2016-11-13 DIAGNOSIS — K5791 Diverticulosis of intestine, part unspecified, without perforation or abscess with bleeding: Secondary | ICD-10-CM | POA: Insufficient documentation

## 2016-11-13 LAB — URINALYSIS, ROUTINE W REFLEX MICROSCOPIC
Bacteria, UA: NONE SEEN
Bilirubin Urine: NEGATIVE
Glucose, UA: NEGATIVE mg/dL
Ketones, ur: NEGATIVE mg/dL
Leukocytes, UA: NEGATIVE
Nitrite: NEGATIVE
Protein, ur: NEGATIVE mg/dL
Specific Gravity, Urine: 1.004 — ABNORMAL LOW (ref 1.005–1.030)
pH: 7 (ref 5.0–8.0)

## 2016-11-13 LAB — CBC WITH DIFFERENTIAL/PLATELET
Basophils Absolute: 0 K/uL (ref 0.0–0.1)
Basophils Relative: 1 %
Eosinophils Absolute: 0.1 K/uL (ref 0.0–0.7)
Eosinophils Relative: 1 %
HCT: 37.5 % (ref 36.0–46.0)
Hemoglobin: 13 g/dL (ref 12.0–15.0)
Lymphocytes Relative: 18 %
Lymphs Abs: 1.1 K/uL (ref 0.7–4.0)
MCH: 31.4 pg (ref 26.0–34.0)
MCHC: 34.7 g/dL (ref 30.0–36.0)
MCV: 90.6 fL (ref 78.0–100.0)
Monocytes Absolute: 0.9 K/uL (ref 0.1–1.0)
Monocytes Relative: 14 %
Neutro Abs: 4 K/uL (ref 1.7–7.7)
Neutrophils Relative %: 66 %
Platelets: 258 K/uL (ref 150–400)
RBC: 4.14 MIL/uL (ref 3.87–5.11)
RDW: 14.2 % (ref 11.5–15.5)
WBC: 6 K/uL (ref 4.0–10.5)

## 2016-11-13 LAB — CORTISOL: Cortisol, Plasma: 17.7 ug/dL

## 2016-11-13 LAB — COMPREHENSIVE METABOLIC PANEL
ALK PHOS: 61 U/L (ref 38–126)
ALT: 15 U/L (ref 14–54)
AST: 26 U/L (ref 15–41)
Albumin: 4.1 g/dL (ref 3.5–5.0)
Anion gap: 10 (ref 5–15)
BUN: 12 mg/dL (ref 6–20)
CALCIUM: 9 mg/dL (ref 8.9–10.3)
CO2: 28 mmol/L (ref 22–32)
CREATININE: 0.69 mg/dL (ref 0.44–1.00)
Chloride: 89 mmol/L — ABNORMAL LOW (ref 101–111)
Glucose, Bld: 109 mg/dL — ABNORMAL HIGH (ref 65–99)
Potassium: 4.1 mmol/L (ref 3.5–5.1)
SODIUM: 127 mmol/L — AB (ref 135–145)
Total Bilirubin: 0.9 mg/dL (ref 0.3–1.2)
Total Protein: 7.4 g/dL (ref 6.5–8.1)

## 2016-11-13 LAB — CBC
HCT: 35.1 % — ABNORMAL LOW (ref 36.0–46.0)
HEMOGLOBIN: 11.7 g/dL — AB (ref 12.0–15.0)
MCH: 30.1 pg (ref 26.0–34.0)
MCHC: 33.3 g/dL (ref 30.0–36.0)
MCV: 90.2 fL (ref 78.0–100.0)
PLATELETS: 256 10*3/uL (ref 150–400)
RBC: 3.89 MIL/uL (ref 3.87–5.11)
RDW: 14 % (ref 11.5–15.5)
WBC: 5.1 10*3/uL (ref 4.0–10.5)

## 2016-11-13 LAB — BASIC METABOLIC PANEL
Anion gap: 7 (ref 5–15)
BUN: 10 mg/dL (ref 6–20)
CALCIUM: 8.3 mg/dL — AB (ref 8.9–10.3)
CHLORIDE: 93 mmol/L — AB (ref 101–111)
CO2: 28 mmol/L (ref 22–32)
CREATININE: 0.59 mg/dL (ref 0.44–1.00)
GFR calc non Af Amer: >60 mL/min (ref >60)
Glucose, Bld: 94 mg/dL (ref 65–99)
Potassium: 4.2 mmol/L (ref 3.5–5.1)
Sodium: 128 mmol/L — ABNORMAL LOW (ref 135–145)

## 2016-11-13 LAB — TSH: TSH: 3.714 u[IU]/mL (ref 0.350–4.500)

## 2016-11-13 LAB — POC OCCULT BLOOD, ED: Fecal Occult Bld: POSITIVE — AB

## 2016-11-13 LAB — SAMPLE TO BLOOD BANK

## 2016-11-13 LAB — PROTIME-INR
INR: 2.36
Prothrombin Time: 25.6 seconds — ABNORMAL HIGH (ref 11.4–15.2)

## 2016-11-13 LAB — OSMOLALITY: OSMOLALITY: 273 mosm/kg — AB (ref 275–295)

## 2016-11-13 MED ORDER — LEVOTHYROXINE SODIUM 50 MCG PO TABS
50.0000 ug | ORAL_TABLET | Freq: Every day | ORAL | Status: DC
  Administered 2016-11-13: 50 ug via ORAL
  Filled 2016-11-13: qty 1

## 2016-11-13 MED ORDER — HYDRALAZINE HCL 20 MG/ML IJ SOLN: 5.0000 mg | Freq: Four times a day (QID) | INTRAMUSCULAR | Status: DC | PRN

## 2016-11-13 MED ORDER — IRBESARTAN 150 MG PO TABS
150.0000 mg | ORAL_TABLET | Freq: Every day | ORAL | Status: DC
  Administered 2016-11-13: 150 mg via ORAL
  Filled 2016-11-13: qty 1

## 2016-11-13 MED ORDER — SODIUM CHLORIDE 0.9 % IV SOLN
INTRAVENOUS | Status: DC
  Administered 2016-11-13: 08:00:00 via INTRAVENOUS

## 2016-11-13 MED ORDER — LEVETIRACETAM 250 MG PO TABS
250.0000 mg | ORAL_TABLET | Freq: Two times a day (BID) | ORAL | Status: DC
  Administered 2016-11-13: 250 mg via ORAL
  Filled 2016-11-13: qty 1

## 2016-11-13 MED ORDER — WARFARIN - PHARMACIST DOSING INPATIENT: Status: DC

## 2016-11-13 MED ORDER — PANTOPRAZOLE SODIUM 40 MG PO TBEC
40.0000 mg | DELAYED_RELEASE_TABLET | Freq: Every day | ORAL | Status: DC
  Administered 2016-11-13: 40 mg via ORAL
  Filled 2016-11-13: qty 1

## 2016-11-13 MED ORDER — ZOLPIDEM TARTRATE 5 MG PO TABS: 5.0000 mg | ORAL_TABLET | Freq: Every evening | ORAL | Status: DC | PRN

## 2016-11-13 MED ORDER — ATENOLOL 25 MG PO TABS
50.0000 mg | ORAL_TABLET | Freq: Every day | ORAL | Status: DC
  Administered 2016-11-13: 50 mg via ORAL
  Filled 2016-11-13: qty 2

## 2016-11-13 MED ORDER — WARFARIN SODIUM 5 MG PO TABS: 5.0000 mg | ORAL_TABLET | Freq: Once | ORAL | Status: DC

## 2016-11-13 MED ORDER — ATORVASTATIN CALCIUM 20 MG PO TABS: 20.0000 mg | ORAL_TABLET | Freq: Every day | ORAL | Status: DC

## 2016-11-13 MED ORDER — FLECAINIDE ACETATE 50 MG PO TABS
50.0000 mg | ORAL_TABLET | Freq: Two times a day (BID) | ORAL | Status: DC
  Administered 2016-11-13: 50 mg via ORAL
  Filled 2016-11-13 (×5): qty 1

## 2016-11-13 NOTE — ED Triage Notes (Signed)
Pt c/o rectal bleeding x 3 hours; pt c/o moderate amount of red blood from rectum and states she has pain to her rectum and lower abdomen

## 2016-11-13 NOTE — Progress Notes (Signed)
IV removed, patient tolerated IV removal well, 2x2 gauze and paper tape applied to site, no bleeding noted, review ed discharge instructions with patient and patient's husband, both verbalized understanding, patient wheeled downstairs in wheelchair and leaving hospital via private car with husband.

## 2016-11-13 NOTE — ED Provider Notes (Signed)
[facility] EMERGENCY DEPARTMENT Provider Note   CSN: [id number] Arrival date & time: 11/13/16  0017  Time seen 12:58 AM   History   Chief Complaint Chief Complaint  Patient presents with  . Rectal Bleeding    HPI [name] is a 83 y.o. female.  HPI patient states late in the afternoon on November 9 she started feeling weak and states she could not relax, she was unable to go to sleep because she felt restless.  She states she has a headache history of atrial fibrillation and she felt like her heart was racing.  She denies any chest pain.  She states her upper abdomen started having a discomfort this evening that she describes as aching.  She states about 10 PM she went to the bathroom and she initially told me she had blood twice with urination and then after asking several different ways she then states it was coming from her rectum.  She states she has been having rectal bleeding off and on for the past 6-12 months.  She states she had a colonoscopy a long time ago and thought maybe she had some polyps.  She denies feeling dizzy or lightheaded.  She states she recently had a UTI and she finished her antibiotics for that 3 days ago.  She did not have blood in her urine at that time.  PCP Timmothy Euler, MD GI was Dr Olevia Perches  Past Medical History:  Diagnosis Date  . Anemia   . Atrial fibrillation (HCC)    Paroxysmal, Flecainide therapy  . Calf pain    September, 2012, at rest  . Cardiomegaly    EF normal, echo, 2003  //   EF 60-65%, echo, June 21, 1912, diastolic dysfunction,  . Carotid bruit    Doppler, December, 2009, no abnormality  . Cataract   . Diverticulosis   . GERD (gastroesophageal reflux disease)   . History of shingles   . HLD (hyperlipidemia)   . HTN (hypertension)   . Hypothyroidism   . Hypothyroidism   . Insomnia   . Normal nuclear stress test    Normal, 2001  . Osteoarthritis   . PONV (postoperative nausea and vomiting)   . Rectal bleeding 2001    diverticulosis and int hemorrhoids on 07/1999 and 02/2010 colonoscopies.  . Stroke ([place])   . Warfarin anticoagulation     Patient Active Problem List   Diagnosis Date Noted  . Abnormal nuclear cardiac imaging test   . Unstable angina ([place]) 06/16/2016  . Atrial flutter ([place]) 06/16/2016  . Abnormal stress test 06/16/2016  . Frequent urination 05/10/2016  . Acute cystitis with hematuria 05/10/2016  . Chest pain 04/23/2016  . Hypomagnesemia 04/23/2016  . AP (abdominal pain)   . Melena 09/23/2015  . Overactive bladder 05/02/2015  . Acute cerebrovascular accident (CVA) ([place]) 04/21/2015  . Acute cardioembolic stroke ([place]) 78/29/5621  . Healthcare maintenance 02/20/2015  . History of dysarthria 02/20/2015  . Enteritis due to Clostridium difficile 01/27/2015  . HTN (hypertension) 01/11/2015  . Hyponatremia 01/11/2015  . Rectal bleeding 01/11/2015  . Chronic pain syndrome 11/25/2014  . Insomnia 11/25/2014  . Chronic ulcer of toe ([place]) 11/13/2014  . Diastolic dysfunction 30/86/5784  . Paroxysmal atrial fibrillation ([place]) 10/14/2014  . Warfarin anticoagulation   . Hypothyroidism   . Carotid bruit   . Normal nuclear stress test   . S/P knee replacement   . UNSPECIFIED ANEMIA 01/19/2010  . DIVERTICULOSIS-COLON 01/19/2010    Past Surgical History:  Procedure Laterality Date  . COLONOSCOPY    . GALLBLADDER SURGERY    . SHOULDER OPEN ROTATOR CUFF REPAIR     bilateral  . TOTAL KNEE ARTHROPLASTY  05/26/10   right  . TOTAL KNEE ARTHROPLASTY     Right  . TOTAL SHOULDER REPLACEMENT  01/2010   left    OB History    Gravida Para Term Preterm AB Living   1 1 1          SAB TAB Ectopic Multiple Live Births                   Home Medications    Prior to Admission medications   Medication Sig Start Date End Date Taking? Authorizing Provider  atenolol (TENORMIN) 50 MG tablet Take 50 mg by mouth daily.    [provider]  atorvastatin (LIPITOR) 20 MG tablet TAKE 1 TABLET  BY MOUTH AT  BEDTIME 10/08/16   Timmothy Euler, MD  ciprofloxacin (CIPRO) 500 MG tablet Take 1 tablet (500 mg total) by mouth 2 (two) times daily. 11/01/16   Sharion Balloon, FNP  flecainide (TAMBOCOR) 100 MG tablet Take 0.5 tablets (50 mg total) by mouth 2 (two) times daily. 06/02/16   Minus Breeding, MD  furosemide (LASIX) 40 MG tablet TAKE 1/2 TABLET BY MOUTH  EVERY MORNING , TAKE 1  TABLET BY MOUTH IF SWELLING OCCURS 10/05/16   Timmothy Euler, MD  irbesartan (AVAPRO) 150 MG tablet Take 1 tablet (150 mg total) by mouth daily. 10/15/16   Timmothy Euler, MD  levETIRAcetam (KEPPRA) 250 MG tablet Take 1 tablet (250 mg total) 2 (two) times daily by mouth. 11/12/16   Timmothy Euler, MD  levothyroxine (SYNTHROID, LEVOTHROID) 50 MCG tablet Take 50 mcg by mouth daily before breakfast.  06/03/16   [provider]  pantoprazole (PROTONIX) 40 MG tablet TAKE 1 TABLET DAILY 10/27/16   Timmothy Euler, MD  potassium chloride SA (K-DUR,KLOR-CON) 20 MEQ tablet Take 20 mEq by mouth daily.    [provider]  warfarin (COUMADIN) 5 MG tablet Take 5 mg by mouth daily.    [provider]  zolpidem (AMBIEN) 5 MG tablet TAKE 1/2 TO 1 TABLET AT BEDTIME AS NEEDED FOR INSOMNIA 09/17/16   Timmothy Euler, MD    Family History Family History  Problem Relation Age of Onset  . COPD Brother   . Atrial fibrillation Brother   . Diabetes Brother   . Liver cancer Brother        Alcohol-related  . Colon cancer Brother   . Heart attack Father   . Early death Father   . Rheum arthritis Mother   . Arthritis Mother   . Heart disease Mother   . Coronary artery disease Brother   . Heart attack Daughter   . Gallbladder disease Brother     Social History Social History   Tobacco Use  . Smoking status: Never Smoker  . Smokeless tobacco: Never Used  Substance Use Topics  . Alcohol use: No  . Drug use: No  lives at home Lives with spouse   Allergies    Penicillins   Review of Systems Review of Systems  All other systems reviewed and are negative.    Physical Exam Updated Vital Signs BP (!) 165/61 (BP Location: Right Arm)   Pulse 66   Temp 97.7 F (36.5 C) (Oral)   Resp 18   Ht 5\' 2"  (1.575 m)   Wt 63  kg (139 lb)   SpO2 100%   BMI 25.42 kg/m   Vital signs normal   Orthostatic VS for the past 24 hrs:  BP- Lying Pulse- Lying BP- Sitting Pulse- Sitting BP- Standing at 0 minutes Pulse- Standing at 0 minutes  11/13/16 0144 165/61 66 151/84 71 143/73 77     Orthostatic Vital signs normal    Physical Exam  Constitutional: She is oriented to person, place, and time. She appears well-developed and well-nourished.  Non-toxic appearance. She does not appear ill. No distress.  HENT:  Head: Normocephalic and atraumatic.  Right Ear: External ear normal.  Left Ear: External ear normal.  Nose: Nose normal. No mucosal edema or rhinorrhea.  Mouth/Throat: Oropharynx is clear and moist and mucous membranes are normal. No dental abscesses or uvula swelling.  Eyes: Conjunctivae and EOM are normal. Pupils are equal, round, and reactive to light.  Neck: Normal range of motion and full passive range of motion without pain. Neck supple.  Cardiovascular: Normal rate, regular rhythm and normal heart sounds. Exam reveals no gallop and no friction rub.  No murmur heard. Pulmonary/Chest: Effort normal and breath sounds normal. No respiratory distress. She has no wheezes. She has no rhonchi. She has no rales. She exhibits no tenderness and no crepitus.  Abdominal: Soft. Normal appearance and bowel sounds are normal. She exhibits no distension. There is no tenderness. There is no rebound and no guarding.    Area of discomfort noted  Musculoskeletal: Normal range of motion. She exhibits no edema or tenderness.  Moves all extremities well.   Neurological: She is alert and oriented to person, place, and time. She has normal strength. No cranial  nerve deficit.  Skin: Skin is warm, dry and intact. No rash noted. No erythema. No pallor.  Psychiatric: She has a normal mood and affect. Her speech is normal and behavior is normal. Her mood appears not anxious.  Nursing note and vitals reviewed.    ED Treatments / Results  Labs (all labs ordered are listed, but only abnormal results are displayed) Results for orders placed or performed during the hospital encounter of 11/13/16  CBC with Differential  Result Value Ref Range   WBC 6.0 4.0 - 10.5 K/uL   RBC 4.14 3.87 - 5.11 MIL/uL   Hemoglobin 13.0 12.0 - 15.0 g/dL   HCT 37.5 36.0 - 46.0 %   MCV 90.6 78.0 - 100.0 fL   MCH 31.4 26.0 - 34.0 pg   MCHC 34.7 30.0 - 36.0 g/dL   RDW 14.2 11.5 - 15.5 %   Platelets 258 150 - 400 K/uL   Neutrophils Relative % 66 %   Neutro Abs 4.0 1.7 - 7.7 K/uL   Lymphocytes Relative 18 %   Lymphs Abs 1.1 0.7 - 4.0 K/uL   Monocytes Relative 14 %   Monocytes Absolute 0.9 0.1 - 1.0 K/uL   Eosinophils Relative 1 %   Eosinophils Absolute 0.1 0.0 - 0.7 K/uL   Basophils Relative 1 %   Basophils Absolute 0.0 0.0 - 0.1 K/uL  Protime-INR  Result Value Ref Range   Prothrombin Time 25.6 (H) 11.4 - 15.2 seconds   INR 2.36   Comprehensive metabolic panel  Result Value Ref Range   Sodium 127 (L) 135 - 145 mmol/L   Potassium 4.1 3.5 - 5.1 mmol/L   Chloride 89 (L) 101 - 111 mmol/L   CO2 28 22 - 32 mmol/L   Glucose, Bld 109 (H) 65 - 99 mg/dL  BUN 12 6 - 20 mg/dL   Creatinine, Ser 0.69 0.44 - 1.00 mg/dL   Calcium 9.0 8.9 - 10.3 mg/dL   Total Protein 7.4 6.5 - 8.1 g/dL   Albumin 4.1 3.5 - 5.0 g/dL   AST 26 15 - 41 U/L   ALT 15 14 - 54 U/L   Alkaline Phosphatase 61 38 - 126 U/L   Total Bilirubin 0.9 0.3 - 1.2 mg/dL   GFR calc non Af Amer >60 >60 mL/min   GFR calc Af Amer >60 >60 mL/min   Anion gap 10 5 - 15  Urinalysis, Routine w reflex microscopic  Result Value Ref Range   Color, Urine COLORLESS (A) YELLOW   APPearance CLEAR CLEAR   Specific Gravity,  Urine 1.004 (L) 1.005 - 1.030   pH 7.0 5.0 - 8.0   Glucose, UA NEGATIVE NEGATIVE mg/dL   Hgb urine dipstick SMALL (A) NEGATIVE   Bilirubin Urine NEGATIVE NEGATIVE   Ketones, ur NEGATIVE NEGATIVE mg/dL   Protein, ur NEGATIVE NEGATIVE mg/dL   Nitrite NEGATIVE NEGATIVE   Leukocytes, UA NEGATIVE NEGATIVE   RBC / HPF 0-5 0 - 5 RBC/hpf   WBC, UA 0-5 0 - 5 WBC/hpf   Bacteria, UA NONE SEEN NONE SEEN   Squamous Epithelial / LPF 0-5 (A) NONE SEEN  POC occult blood, ED RN will collect  Result Value Ref Range   Fecal Occult Bld POSITIVE (A) NEGATIVE  Sample to Blood Bank  Result Value Ref Range   Blood Bank Specimen SAMPLE AVAILABLE FOR TESTING    Sample Expiration 11/13/2016    Laboratory interpretation all normal except therapeutic INR, no significant hematuria, positive Hemoccult without gross blood, mild hyponatremia and low chloride    EKG  EKG Interpretation None       Radiology No results found.  Procedures Procedures (including critical care time)  Medications Ordered in ED Medications - No data to display   Initial Impression / Assessment and Plan / ED Course  I have reviewed the triage vital signs and the nursing notes.  Pertinent labs & imaging results that were available during my care of the patient were reviewed by me and considered in my medical decision making (see chart for details).  Hemoccult was positive but not grossly bloody   Recheck at 3:45 AM she states she has had no more bleeding.  We discussed her laboratory test results.  We discussed admission to make sure the bleeding has stopped and may be have Dr. Dereck Leep recheck her in the morning.  She is agreeable.  4:16 AM Dr. Maudie Mercury, hospitalist will admit    Looking at patient's chart she had a colonoscopy and endoscopy done in September 2017.  Colonoscopy showed diverticulosis throughout the colon and internal hemorrhoids.  Endoscopy showed normal esophagus, 4 cm hiatal hernia, nonbleeding erosive  gastropathy and normal duodenal bulb with second portion of the duodenum.  These were done by Dr. Laural Golden.    Final Clinical Impressions(s) / ED Diagnoses   Final diagnoses:  Rectal bleeding    Plan admission  Rolland Porter, MD, Barbette Or, MD 11/13/16 [phone]

## 2016-11-13 NOTE — ED Notes (Signed)
Pt's hemacult was positive

## 2016-11-13 NOTE — Progress Notes (Signed)
ANTICOAGULATION CONSULT NOTE - Initial Consult  Pharmacy Consult for Coumadin (home med) Indication: atrial fibrillation  Allergies  Allergen Reactions  . Penicillins Rash and Other (See Comments)    Has patient had a PCN reaction causing immediate rash, facial/tongue/throat swelling, SOB or lightheadedness with hypotension: No Has patient had a PCN reaction causing severe rash involving mucus membranes or skin necrosis: No Has patient had a PCN reaction that required hospitalization No Has patient had a PCN reaction occurring within the last 10 years: No If all of the above answers are "NO", then may proceed with Cephalosporin use.   Patient Measurements: Height: 5\' 2"  (157.5 cm) Weight: 137 lb 8 oz (62.4 kg) IBW/kg (Calculated) : 50.1  Vital Signs: Temp: 98.1 F (36.7 C) (11/10 0556) Temp Source: Oral (11/10 0556) BP: 173/64 (11/10 0556) Pulse Rate: 66 (11/10 0556)  Labs: Recent Labs    11/13/16 0110  HGB 13.0  HCT 37.5  PLT 258  LABPROT 25.6*  INR 2.36  CREATININE 0.69   Estimated Creatinine Clearance: 47.1 mL/min (by C-G formula based on SCr of 0.69 mg/dL).  Medical History: Past Medical History:  Diagnosis Date  . Anemia   . Atrial fibrillation (HCC)    Paroxysmal, Flecainide therapy  . Calf pain    September, 2012, at rest  . Cardiomegaly    EF normal, echo, 2003  //   EF 60-65%, echo, June 21, 3265, diastolic dysfunction,  . Carotid bruit    Doppler, December, 2009, no abnormality  . Cataract   . Diverticulosis   . GERD (gastroesophageal reflux disease)   . History of shingles   . HLD (hyperlipidemia)   . HTN (hypertension)   . Hypothyroidism   . Hypothyroidism   . Insomnia   . Normal nuclear stress test    Normal, 2001  . Osteoarthritis   . PONV (postoperative nausea and vomiting)   . Rectal bleeding 2001   diverticulosis and int hemorrhoids on 07/1999 and 02/2010 colonoscopies.  . Stroke ([place])   . Warfarin anticoagulation    Medications:   Medications Prior to Admission  Medication Sig Dispense Refill Last Dose  . atenolol (TENORMIN) 50 MG tablet Take 50 mg by mouth daily.   Taking  . atorvastatin (LIPITOR) 20 MG tablet TAKE 1 TABLET BY MOUTH AT  BEDTIME 90 tablet 1 Taking  . ciprofloxacin (CIPRO) 500 MG tablet Take 1 tablet (500 mg total) by mouth 2 (two) times daily. 14 tablet 0 Taking  . flecainide (TAMBOCOR) 100 MG tablet Take 0.5 tablets (50 mg total) by mouth 2 (two) times daily.   Taking  . furosemide (LASIX) 40 MG tablet TAKE 1/2 TABLET BY MOUTH  EVERY MORNING , TAKE 1  TABLET BY MOUTH IF SWELLING OCCURS 90 tablet 0 Taking  . irbesartan (AVAPRO) 150 MG tablet Take 1 tablet (150 mg total) by mouth daily. 30 tablet 3 Taking  . levETIRAcetam (KEPPRA) 250 MG tablet Take 1 tablet (250 mg total) 2 (two) times daily by mouth. 60 tablet 2   . levothyroxine (SYNTHROID, LEVOTHROID) 50 MCG tablet Take 50 mcg by mouth daily before breakfast.    Taking  . pantoprazole (PROTONIX) 40 MG tablet TAKE 1 TABLET DAILY 90 tablet 0 Taking  . potassium chloride SA (K-DUR,KLOR-CON) 20 MEQ tablet Take 20 mEq by mouth daily.   Taking  . warfarin (COUMADIN) 5 MG tablet Take 5 mg by mouth daily.   Taking  . zolpidem (AMBIEN) 5 MG tablet TAKE 1/2 TO 1 TABLET AT  BEDTIME AS NEEDED FOR INSOMNIA 30 tablet 2 Taking   Assessment: 83yo female on chronic Coumadin for h/o afib.  INR therapeutic on admission.   Goal of Therapy:  INR 2-3 Monitor platelets by anticoagulation protocol: Yes   Plan:  Coumadin 5mg  today x 1 (home dose) INR daily  [place] Crane, Ikesha Siller A 11/13/2016,8:33 AM

## 2016-11-13 NOTE — Discharge Summary (Signed)
Physician Discharge Summary  [name] YSA:[id number] DOB: 12/04/1932 DOA: 11/13/2016  PCP: Timmothy Euler, MD  Admit date: 11/13/2016 Discharge date: 11/13/2016  Time spent: 35 minutes  Recommendations for Outpatient Follow-up:  PCP Dr. Wendi Snipes in 1 week, please check sodium and CBC at follow-up Gastroenterology Dr. Melony Overly in 2 weeks  Discharge Diagnoses:    Suspected hemorrhoidal bleed   Diverticulosis   Chronic diastolic CHF   Paroxysmal atrial fibrillation   Hypothyroidism   HTN (hypertension)   Hyponatremia   Discharge Condition: Stable  Diet recommendation: Heart healthy, low-sodium  Filed Weights   11/13/16 0042 11/13/16 0556  Weight: 63 kg (139 lb) 62.4 kg (137 lb 8 oz)    History of present illness:  [name]  is a 83 y.o. female, w Pafib, hypertension, hyperlipidemia, hypothyroidism, gerd,  Diverticulosis/hemorrhoids,  w c/o rectal bleeding, blood on toilet paper last nite at about 6pm. Slight bloating,  Last egd/ colonoscopy 09/29/2015 (Dr. Laural Golden) with mild gastritis and hemorrhoids as well as diverticulosis  Hospital Course:   1. Lower GI bleeding -Suspected to be likely hemorrhoidal, or mild case of diverticular bleed -No further bleeding for 20 hours, Slight drop in hemoglobin to her baseline of 11.7 can be accounted to hemodilution since she got fluids for the last 14 hours  -Case discussed with her gastroenterologist Dr. Melony Overly who agreed with supportive care and possible discharge today if no further bleeding  -Needs follow-up with primary care doctor and gastroenterology in one and 2 weeks respectively and CBC check at follow-up  -We have continued her Coumadin just since she did not have any further episodes of bleeding   2. Hyponatremia -Clinically appeared to be euvolemic could be secondary to diuretic use -She was given fluids overnight and her sodium is 128 this morning, she is completely asymptomatic  3. Chronic diastolic  CHF -Stable, diuretics resumed  Rest of her chronic medical problems were stable  Consultations:  Discussed with gastroenterology Dr. Melony Overly  Discharge Exam: Vitals:   11/13/16 0150 11/13/16 0556  BP: (!) 165/61 (!) 173/64  Pulse: 66 66  Resp: 18 18  Temp:  98.1 F (36.7 C)  SpO2: 100% 99%    General: Alert awake oriented 3 Cardiovascular: S1-S2 regular rate rhythm Respiratory: Clear bilaterally  Discharge Instructions   Discharge Instructions    Diet - low sodium heart healthy   Complete by:  As directed    Increase activity slowly   Complete by:  As directed      Current Discharge Medication List    CONTINUE these medications which have NOT CHANGED   Details  atenolol (TENORMIN) 50 MG tablet Take 50 mg by mouth daily.    atorvastatin (LIPITOR) 20 MG tablet TAKE 1 TABLET BY MOUTH AT  BEDTIME Qty: 90 tablet, Refills: 1    flecainide (TAMBOCOR) 100 MG tablet Take 0.5 tablets (50 mg total) by mouth 2 (two) times daily.    furosemide (LASIX) 40 MG tablet TAKE 1/2 TABLET BY MOUTH  EVERY MORNING , TAKE 1  TABLET BY MOUTH IF SWELLING OCCURS Qty: 90 tablet, Refills: 0    irbesartan (AVAPRO) 150 MG tablet Take 1 tablet (150 mg total) by mouth daily. Qty: 30 tablet, Refills: 3    levETIRAcetam (KEPPRA) 250 MG tablet Take 1 tablet (250 mg total) 2 (two) times daily by mouth. Qty: 60 tablet, Refills: 2    levothyroxine (SYNTHROID, LEVOTHROID) 50 MCG tablet Take 50 mcg by mouth daily before breakfast.  pantoprazole (PROTONIX) 40 MG tablet TAKE 1 TABLET DAILY Qty: 90 tablet, Refills: 0    potassium chloride SA (K-DUR,KLOR-CON) 20 MEQ tablet Take 20 mEq by mouth daily.    warfarin (COUMADIN) 5 MG tablet Take 5 mg by mouth daily.    zolpidem (AMBIEN) 5 MG tablet TAKE 1/2 TO 1 TABLET AT BEDTIME AS NEEDED FOR INSOMNIA Qty: 30 tablet, Refills: 2      STOP taking these medications     ciprofloxacin (CIPRO) 500 MG tablet        Allergies  Allergen Reactions   . Penicillins Rash and Other (See Comments)    Has patient had a PCN reaction causing immediate rash, facial/tongue/throat swelling, SOB or lightheadedness with hypotension: No Has patient had a PCN reaction causing severe rash involving mucus membranes or skin necrosis: No Has patient had a PCN reaction that required hospitalization No Has patient had a PCN reaction occurring within the last 10 years: No If all of the above answers are "NO", then may proceed with Cephalosporin use.   Follow-up Information    Rehman, Mechele Dawley, MD. Schedule an appointment as soon as possible for a visit in 2 week(s).   Specialty:  Gastroenterology Contact information: [address], [address] [place] [address] [phone]            The results of significant diagnostics from this hospitalization (including imaging, microbiology, ancillary and laboratory) are listed below for reference.    Significant Diagnostic Studies: No results found.  Microbiology: No results found for this or any previous visit (from the past 240 hour(s)).   Labs: Basic Metabolic Panel: Recent Labs  Lab 11/13/16 0110 11/13/16 1150  NA 127* 128*  K 4.1 4.2  CL 89* 93*  CO2 28 28  GLUCOSE 109* 94  BUN 12 10  CREATININE 0.69 0.59  CALCIUM 9.0 8.3*   Liver Function Tests: Recent Labs  Lab 11/13/16 0110  AST 26  ALT 15  ALKPHOS 61  BILITOT 0.9  PROT 7.4  ALBUMIN 4.1   No results for input(s): LIPASE, AMYLASE in the last 168 hours. No results for input(s): AMMONIA in the last 168 hours. CBC: Recent Labs  Lab 11/13/16 0110 11/13/16 1150  WBC 6.0 5.1  NEUTROABS 4.0  --   HGB 13.0 11.7*  HCT 37.5 35.1*  MCV 90.6 90.2  PLT 258 256   Cardiac Enzymes: No results for input(s): CKTOTAL, CKMB, CKMBINDEX, TROPONINI in the last 168 hours. BNP: BNP (last 3 results) No results for input(s): BNP in the last 8760 hours.  ProBNP (last 3 results) No results for input(s): PROBNP in the last 8760  hours.  CBG: No results for input(s): GLUCAP in the last 168 hours.     SignedDomenic Polite MD.  [facility] 11/13/2016, 1:03 PM

## 2016-11-13 NOTE — ED Notes (Addendum)
Patient ambulated to restroom with standby assistance. Patient only urinated

## 2016-11-13 NOTE — H&P (Addendum)
TRH H&P   Patient Demographics:    [name], is a 83 y.o. female  MRN: [id number]   DOB - 10-Mar-1932  Admit Date - 11/13/2016  Outpatient Primary MD for the patient is Timmothy Euler, MD  Referring MD/NP/PA:  Rolland Porter  Outpatient Specialists: Dr. Laural Golden  Patient coming from: home  Chief Complaint  Patient presents with  . Rectal Bleeding      HPI:    [name]  is a 83 y.o. female, w Pafib, hypertension, hyperlipidemia, hypothyroidism, gerd,  Diverticulosis/hemorrhoids,  w c/o rectal bleeding, blood on toilet paper last nite at about 6pm. Slight bloating   Slight loose stool.   Denies fever, chills, n/v, constpation, black stool.  Last egd/ colonoscopy 09/29/2015 (Dr. Laural Golden)  In ED,  Wbc 6.0, Hgb 13.0, Plt 258 INR 2.36 Na 127, Bun 12, Creatinine 0.69 ua negative  Pt will be admitted observation for rectal bleeding.      Review of systems:    In addition to the HPI above,  No Fever-chills, No Headache, No changes with Vision or hearing, No problems swallowing food or Liquids, No Chest pain, Cough or Shortness of Breath, No Abdominal pain,  No Blood in Urine, No dysuria, No new skin rashes or bruises, No new joints pains-aches,  No new weakness, tingling, numbness in any extremity, No recent weight gain or loss, No polyuria, polydypsia or polyphagia, No significant Mental Stressors.  A full 10 point Review of Systems was done, except as stated above, all other Review of Systems were negative.   With Past History of the following :    Past Medical History:  Diagnosis Date  . Anemia   . Atrial fibrillation (HCC)    Paroxysmal, Flecainide therapy  . Calf pain    September, 2012, at rest  . Cardiomegaly    EF normal, echo, 2003  //   EF 60-65%, echo, June 22, 9474, diastolic dysfunction,  . Carotid bruit    Doppler, December, 2009, no  abnormality  . Cataract   . Diverticulosis   . GERD (gastroesophageal reflux disease)   . History of shingles   . HLD (hyperlipidemia)   . HTN (hypertension)   . Hypothyroidism   . Hypothyroidism   . Insomnia   . Normal nuclear stress test    Normal, 2001  . Osteoarthritis   . PONV (postoperative nausea and vomiting)   . Rectal bleeding 2001   diverticulosis and int hemorrhoids on 07/1999 and 02/2010 colonoscopies.  . Stroke ([place])   . Warfarin anticoagulation       Past Surgical History:  Procedure Laterality Date  . COLONOSCOPY    . GALLBLADDER SURGERY    . SHOULDER OPEN ROTATOR CUFF REPAIR     bilateral  . TOTAL KNEE ARTHROPLASTY  05/26/10   right  . TOTAL KNEE ARTHROPLASTY     Right  . TOTAL  SHOULDER REPLACEMENT  01/2010   left      Social History:     Social History   Tobacco Use  . Smoking status: Never Smoker  . Smokeless tobacco: Never Used  Substance Use Topics  . Alcohol use: No     Lives - at home  Mobility -  Walks by self     Family History :     Family History  Problem Relation Age of Onset  . COPD Brother   . Atrial fibrillation Brother   . Diabetes Brother   . Liver cancer Brother        Alcohol-related  . Colon cancer Brother   . Heart attack Father   . Early death Father   . Rheum arthritis Mother   . Arthritis Mother   . Heart disease Mother   . Coronary artery disease Brother   . Heart attack Daughter   . Gallbladder disease Brother       Home Medications:   Prior to Admission medications   Medication Sig Start Date End Date Taking? Authorizing Provider  atenolol (TENORMIN) 50 MG tablet Take 50 mg by mouth daily.    [provider]  atorvastatin (LIPITOR) 20 MG tablet TAKE 1 TABLET BY MOUTH AT  BEDTIME 10/08/16   Timmothy Euler, MD  ciprofloxacin (CIPRO) 500 MG tablet Take 1 tablet (500 mg total) by mouth 2 (two) times daily. 11/01/16   Sharion Balloon, FNP  flecainide (TAMBOCOR) 100 MG tablet Take 0.5  tablets (50 mg total) by mouth 2 (two) times daily. 06/02/16   Minus Breeding, MD  furosemide (LASIX) 40 MG tablet TAKE 1/2 TABLET BY MOUTH  EVERY MORNING , TAKE 1  TABLET BY MOUTH IF SWELLING OCCURS 10/05/16   Timmothy Euler, MD  irbesartan (AVAPRO) 150 MG tablet Take 1 tablet (150 mg total) by mouth daily. 10/15/16   Timmothy Euler, MD  levETIRAcetam (KEPPRA) 250 MG tablet Take 1 tablet (250 mg total) 2 (two) times daily by mouth. 11/12/16   Timmothy Euler, MD  levothyroxine (SYNTHROID, LEVOTHROID) 50 MCG tablet Take 50 mcg by mouth daily before breakfast.  06/03/16   [provider]  pantoprazole (PROTONIX) 40 MG tablet TAKE 1 TABLET DAILY 10/27/16   Timmothy Euler, MD  potassium chloride SA (K-DUR,KLOR-CON) 20 MEQ tablet Take 20 mEq by mouth daily.    [provider]  warfarin (COUMADIN) 5 MG tablet Take 5 mg by mouth daily.    [provider]  zolpidem (AMBIEN) 5 MG tablet TAKE 1/2 TO 1 TABLET AT BEDTIME AS NEEDED FOR INSOMNIA 09/17/16   Timmothy Euler, MD     Allergies:     Allergies  Allergen Reactions  . Penicillins Rash and Other (See Comments)    Has patient had a PCN reaction causing immediate rash, facial/tongue/throat swelling, SOB or lightheadedness with hypotension: No Has patient had a PCN reaction causing severe rash involving mucus membranes or skin necrosis: No Has patient had a PCN reaction that required hospitalization No Has patient had a PCN reaction occurring within the last 10 years: No If all of the above answers are "NO", then may proceed with Cephalosporin use.     Physical Exam:   Vitals  Blood pressure (!) 165/61, pulse 66, temperature 97.7 F (36.5 C), temperature source Oral, resp. rate 18, height 5\' 2"  (1.575 m), weight 63 kg (139 lb), SpO2 100 %.   1. General lying in bed in NAD,   2.  Normal affect and insight, Not Suicidal or Homicidal, Awake Alert, Oriented X 3.  3. No F.N deficits, ALL C.Nerves Intact,  Strength 5/5 all 4 extremities, Sensation intact all 4 extremities, Plantars down going.  4. Ears and Eyes appear Normal, Conjunctivae clear, PERRLA. Moist Oral Mucosa.  5. Supple Neck, No JVD, No cervical lymphadenopathy appriciated, No Carotid Bruits.  6. Symmetrical Chest wall movement, Good air movement bilaterally, CTAB.  7. RRR, No Gallops, Rubs or Murmurs, No Parasternal Heave.  8. Positive Bowel Sounds, Abdomen Soft, No tenderness, No organomegaly appriciated,No rebound -guarding or rigidity.  9.  No Cyanosis, Normal Skin Turgor, No Skin Rash or Bruise.  10. Good muscle tone,  joints appear normal , no effusions, Normal ROM.  11. No Palpable Lymph Nodes in Neck or Axillae     Data Review:    CBC Recent Labs  Lab 11/13/16 0110  WBC 6.0  HGB 13.0  HCT 37.5  PLT 258  MCV 90.6  MCH 31.4  MCHC 34.7  RDW 14.2  LYMPHSABS 1.1  MONOABS 0.9  EOSABS 0.1  BASOSABS 0.0   ------------------------------------------------------------------------------------------------------------------  Chemistries  Recent Labs  Lab 11/13/16 0110  NA 127*  K 4.1  CL 89*  CO2 28  GLUCOSE 109*  BUN 12  CREATININE 0.69  CALCIUM 9.0  AST 26  ALT 15  ALKPHOS 61  BILITOT 0.9   ------------------------------------------------------------------------------------------------------------------ estimated creatinine clearance is 47.3 mL/min (by C-G formula based on SCr of 0.69 mg/dL). ------------------------------------------------------------------------------------------------------------------ No results for input(s): TSH, T4TOTAL, T3FREE, THYROIDAB in the last 72 hours.  Invalid input(s): FREET3  Coagulation profile Recent Labs  Lab 11/13/16 0110  INR 2.36   ------------------------------------------------------------------------------------------------------------------- No results for input(s): DDIMER in the last 72  hours. -------------------------------------------------------------------------------------------------------------------  Cardiac Enzymes No results for input(s): CKMB, TROPONINI, MYOGLOBIN in the last 168 hours.  Invalid input(s): CK ------------------------------------------------------------------------------------------------------------------    Component Value Date/Time   BNP 273.1 (H) 01/12/2015 0122     ---------------------------------------------------------------------------------------------------------------  Urinalysis    Component Value Date/Time   COLORURINE COLORLESS (A) 11/13/2016 0124   APPEARANCEUR CLEAR 11/13/2016 0124   APPEARANCEUR Clear 11/01/2016 1550   LABSPEC 1.004 (L) 11/13/2016 0124   PHURINE 7.0 11/13/2016 0124   GLUCOSEU NEGATIVE 11/13/2016 0124   HGBUR SMALL (A) 11/13/2016 0124   BILIRUBINUR NEGATIVE 11/13/2016 0124   BILIRUBINUR Negative 11/01/2016 1550   KETONESUR NEGATIVE 11/13/2016 0124   PROTEINUR NEGATIVE 11/13/2016 0124   UROBILINOGEN 0.2 11/09/2010 1235   NITRITE NEGATIVE 11/13/2016 0124   LEUKOCYTESUR NEGATIVE 11/13/2016 0124   LEUKOCYTESUR Negative 11/01/2016 1550    ----------------------------------------------------------------------------------------------------------------   Imaging Results:    No results found.     Assessment & Plan:    Principal Problem:   Rectal bleeding Active Problems:   Hyponatremia  Rectal bleeding likely hemorrhoids ddx diverticulosis Cbc at noon NPO  GI consult  Loose stool If persistent or worsening, consider diverticulitis and start treatment with Flagyl  Hyponatremia Check serum osm, cortisol, tsh Check urine sodium, urine osm Hydrate gently with ns iv HOLD LASIX Check cmp in am  Hypertension Cont irbesartan HOLD lasix  Pafib Coumadin pharmacy to dose, continue for now, if rectal bleeding continues then need to STOP Cont flecainide Cont  atenolol  Hypothyroidism Cont levothyroxine  Gerd Cont protonix   DVT Prophylaxis  SCDs  AM Labs Ordered, also please review Full Orders  Family Communication: Admission, patients condition and plan of care including tests being ordered have been discussed with the patient  who indicate understanding and  agree with the plan and Code Status.  Code Status FULL CODE  Likely DC to  home  Condition GUARDED    Consults called:  GI consult placed in computer  Admission status: observation  Time spent in minutes : 45   Jani Gravel M.D on 11/13/2016 at 4:26 AM  Between 7am to 7pm - Pager - [phone]  . After 7pm go to [email or web address] - password [facility]  [facility] - Office  [phone]

## 2016-11-14 LAB — URINE CULTURE
Culture: NO GROWTH
Special Requests: NORMAL

## 2016-11-15 LAB — OCCULT BLOOD, POC DEVICE: FECAL OCCULT BLD: POSITIVE — AB

## 2016-11-16 ENCOUNTER — Other Ambulatory Visit: Payer: Self-pay | Admitting: Family Medicine

## 2016-11-23 ENCOUNTER — Other Ambulatory Visit: Payer: Self-pay | Admitting: Family Medicine

## 2016-12-03 ENCOUNTER — Ambulatory Visit (INDEPENDENT_AMBULATORY_CARE_PROVIDER_SITE_OTHER): Payer: Medicare Other | Admitting: Pharmacist Clinician (PhC)/ Clinical Pharmacy Specialist

## 2016-12-03 ENCOUNTER — Telehealth: Payer: Self-pay | Admitting: Family Medicine

## 2016-12-03 DIAGNOSIS — I480 Paroxysmal atrial fibrillation: Secondary | ICD-10-CM | POA: Diagnosis not present

## 2016-12-03 LAB — COAGUCHEK XS/INR WAIVED: INR: >8 (ref 0.9–1.1)

## 2016-12-03 LAB — HEMOGLOBIN, FINGERSTICK: HEMOGLOBIN: 11.6 g/dL (ref 11.1–15.9)

## 2016-12-03 NOTE — Telephone Encounter (Signed)
lmtcb

## 2016-12-03 NOTE — Telephone Encounter (Signed)
Patient is wanting to know why flecainide 100mg  is on her prescription list with two different directions. The most recent one and they one she has been taking is take 1 tab BID. There is another one for take .5 BID. Please review and make sure patient is taking correct dose. Patient is very confused and worried. Covering PCP

## 2016-12-04 LAB — PROTIME-INR
INR: 7.7 — AB (ref 0.8–1.2)
PROTHROMBIN TIME: 71.4 s — AB (ref 9.1–12.0)

## 2016-12-06 ENCOUNTER — Encounter: Payer: Self-pay | Admitting: Family Medicine

## 2016-12-06 ENCOUNTER — Ambulatory Visit (INDEPENDENT_AMBULATORY_CARE_PROVIDER_SITE_OTHER): Payer: Medicare Other | Admitting: Family Medicine

## 2016-12-06 ENCOUNTER — Telehealth: Payer: Self-pay | Admitting: Family Medicine

## 2016-12-06 ENCOUNTER — Ambulatory Visit (HOSPITAL_COMMUNITY)
Admission: RE | Admit: 2016-12-06 | Discharge: 2016-12-06 | Disposition: A | Discharge status: Home / Self Care | Payer: Medicare Other | Source: Ambulatory Visit | Attending: Family Medicine | Admitting: Family Medicine

## 2016-12-06 VITALS — BP 137/84 | HR 70 | Temp 97.0°F | Ht 62.0 in | Wt 136.0 lb

## 2016-12-06 DIAGNOSIS — I480 Paroxysmal atrial fibrillation: Secondary | ICD-10-CM

## 2016-12-06 DIAGNOSIS — R278 Other lack of coordination: Secondary | ICD-10-CM | POA: Diagnosis not present

## 2016-12-06 DIAGNOSIS — I998 Other disorder of circulatory system: Secondary | ICD-10-CM | POA: Diagnosis not present

## 2016-12-06 DIAGNOSIS — Z7901 Long term (current) use of anticoagulants: Secondary | ICD-10-CM | POA: Diagnosis not present

## 2016-12-06 DIAGNOSIS — R410 Disorientation, unspecified: Secondary | ICD-10-CM | POA: Diagnosis not present

## 2016-12-06 DIAGNOSIS — R2689 Other abnormalities of gait and mobility: Secondary | ICD-10-CM | POA: Insufficient documentation

## 2016-12-06 LAB — COAGUCHEK XS/INR WAIVED
INR: 1.1 (ref 0.9–1.1)
PROTHROMBIN TIME: 13.1 s

## 2016-12-06 NOTE — Patient Instructions (Signed)
Description   Patient has not taken Coumadin for the past 4 days INR of 1.1.  Resume Coumadin at previous dosing.  Patient thinks it was high last time because she took too many of them and was taking it wrong.  Her husband will take over dosing.

## 2016-12-06 NOTE — Progress Notes (Addendum)
BP 137/84   Pulse 70   Temp (!) 97 F (36.1 C) (Oral)   Ht 5\' 2"  (1.575 m)   Wt 136 lb (61.7 kg)   BMI 24.87 kg/m    Subjective:    Patient ID: [name], female    DOB: [birth year], 83 y.o.   MRN: [id number]  HPI: [name] is a 83 y.o. female presenting on 12/06/2016 for Coagulation Disorder (last Coumadin dosage was last Wednesday )   HPI A. fib on Coumadin recheck Patient is coming in today for a complete recheck.  She denies any bleeding.  Her INR today is 1.1.  She had supposedly been taking her Coumadin wrong before and had greater than 7 last week.  She has not been taking her Coumadin over the past 5 days.  Confusion and off balance and difficulty talking Patient has been having some confusion off balance over the past couple weeks and then she feels like she is been having difficulty talking just today.  She is trying to get out her words and she will say completely other words.  She realizes she said the wrong words but it takes her a few tries to try and get the right words out.  She says she feels like she is off balance and leaning to one side and because of that she has to be very careful not to fall.  She denies any headaches or visual disturbances or changes in taste or smell or hearing.  She denies any bleeding anywhere.  She denies any urinary symptoms or vaginal symptoms or bleeding.  Relevant past medical, surgical, family and social history reviewed and updated as indicated. Interim medical history since our last visit reviewed. Allergies and medications reviewed and updated.  Review of Systems  Constitutional: Negative for chills and fever.  HENT: Negative for congestion, ear discharge and ear pain.   Eyes: Negative for redness and visual disturbance.  Respiratory: Negative for cough, chest tightness and shortness of breath.   Cardiovascular: Negative for chest pain and leg swelling.  Gastrointestinal: Negative for abdominal pain.  Genitourinary:  Negative for difficulty urinating, dysuria, flank pain, frequency and urgency.  Musculoskeletal: Negative for back pain and gait problem.  Skin: Negative for color change and rash.  Neurological: Positive for dizziness. Negative for weakness, light-headedness, numbness and headaches.  Psychiatric/Behavioral: Positive for confusion. Negative for agitation, behavioral problems, decreased concentration and dysphoric mood.  All other systems reviewed and are negative.   Per HPI unless specifically indicated above     Objective:    BP 137/84   Pulse 70   Temp (!) 97 F (36.1 C) (Oral)   Ht 5\' 2"  (1.575 m)   Wt 136 lb (61.7 kg)   BMI 24.87 kg/m   Wt Readings from Last 3 Encounters:  12/06/16 136 lb (61.7 kg)  11/13/16 137 lb 8 oz (62.4 kg)  11/03/16 139 lb 11.2 oz (63.4 kg)    Physical Exam  Constitutional: She is oriented to person, place, and time. She appears well-developed and well-nourished. No distress.  Eyes: Conjunctivae are normal.  Neck: Neck supple. No thyromegaly present.  Cardiovascular: Normal rate, normal heart sounds and intact distal pulses. An irregularly irregular rhythm present.  No murmur heard. Pulmonary/Chest: Effort normal and breath sounds normal. No respiratory distress. She has no wheezes. She has no rales.  Abdominal: Soft. Bowel sounds are normal. She exhibits no distension. There is no tenderness. There is no rebound.  Musculoskeletal: Normal range of  motion. She exhibits no edema.  Lymphadenopathy:    She has no cervical adenopathy.  Neurological: She is alert and oriented to person, place, and time. No cranial nerve deficit or sensory deficit. She exhibits normal muscle tone. Coordination normal.  Patient at times is garbled speech and uses incorrect words, she knows she is does this but she has trouble getting up the right words.  Sometimes she can get out right words and sometimes there is mixed up.  Skin: Skin is warm and dry. No rash noted. She is  not diaphoretic.  Psychiatric: She has a normal mood and affect. Her behavior is normal.  Nursing note and vitals reviewed.   INR: 1.1  Description   Patient has not taken Coumadin for the past 4 days INR of 1.1.  Resume Coumadin at previous dosing.  Patient thinks it was high last time because she took too many of them and was taking it wrong.  Her husband will take over dosing.          Assessment & Plan:   Problem List Items Addressed This Visit      Cardiovascular and Mediastinum   Paroxysmal atrial fibrillation (HCC)   Relevant Orders   CoaguChek XS/INR Waived (Completed)     Other   Warfarin anticoagulation - Primary    Other Visit Diagnoses    Dyspraxia       Relevant Orders   MR Brain Wo Contrast   Balance problems       Relevant Orders   MR Brain Wo Contrast       Follow up plan: Return in about 2 weeks (around 12/20/2016), or if symptoms worsen or fail to improve, for Needs a Coumadin recheck in 2 weeks with Sharyn Lull.  Counseling provided for all of the vaccine components Orders Placed This Encounter  Procedures  . MR Brain Wo Contrast  . CoaguChek XS/INR Ulyses Southward, MD [facility] 12/06/2016, 10:27 AM

## 2016-12-07 NOTE — Telephone Encounter (Signed)
Pt notified of MRI results Verbalizes understanding

## 2016-12-14 ENCOUNTER — Ambulatory Visit (INDEPENDENT_AMBULATORY_CARE_PROVIDER_SITE_OTHER): Payer: Medicare Other | Admitting: Internal Medicine

## 2016-12-17 ENCOUNTER — Encounter: Payer: Medicare Other | Admitting: Pharmacist Clinician (PhC)/ Clinical Pharmacy Specialist

## 2016-12-18 ENCOUNTER — Other Ambulatory Visit: Payer: Self-pay | Admitting: Family Medicine

## 2016-12-21 ENCOUNTER — Ambulatory Visit (INDEPENDENT_AMBULATORY_CARE_PROVIDER_SITE_OTHER): Payer: Medicare Other | Admitting: Nurse Practitioner

## 2016-12-21 VITALS — BP 171/73 | HR 69 | Ht 62.0 in

## 2016-12-21 DIAGNOSIS — I480 Paroxysmal atrial fibrillation: Secondary | ICD-10-CM

## 2016-12-21 DIAGNOSIS — K625 Hemorrhage of anus and rectum: Secondary | ICD-10-CM | POA: Diagnosis not present

## 2016-12-21 LAB — HEMOGLOBIN, FINGERSTICK: Hemoglobin: 11.2 g/dL (ref 11.1–15.9)

## 2016-12-21 LAB — COAGUCHEK XS/INR WAIVED
INR: 2.2 — ABNORMAL HIGH (ref 0.9–1.1)
Prothrombin Time: 26.8 s

## 2016-12-21 NOTE — Progress Notes (Addendum)
   Subjective:    Patient ID: [name], female    DOB: 1932/11/18, 83 y.o.   MRN: [id number]  HPI   Patient comes in today for INR but is complaining of rectal bleeding. She has had constipation and did digital removal of impaction and has had some bright red bleeding since   Indication: atrial fibrillation Bleeding signs/symptoms: Yes - see note above Thromboembolic signs/symptoms: None  Missed Coumadin doses: None Medication changes: no Dietary changes: no Bacterial/viral infection: no Other concerns: no       Review of Systems  Respiratory: Negative.   Cardiovascular: Negative.   Gastrointestinal: Positive for blood in stool and constipation. Negative for abdominal pain and diarrhea.  Genitourinary: Negative.   Neurological: Negative.   Psychiatric/Behavioral: Negative.   All other systems reviewed and are negative.      Objective:   Physical Exam  Constitutional: She is oriented to person, place, and time. She appears well-developed and well-nourished. No distress.  Cardiovascular: Normal rate.  Pulmonary/Chest: Effort normal and breath sounds normal.  Abdominal: Soft. Bowel sounds are normal.  Neurological: She is alert and oriented to person, place, and time.  Skin: Skin is warm.  Psychiatric: She has a normal mood and affect. Her behavior is normal. Judgment and thought content normal.   BP (!) 171/73 (BP Location: Right Arm, Patient Position: Sitting, Cuff Size: Normal)   Pulse 69   Ht 5\' 2"  (1.575 m)   BMI 24.87 kg/m  INR 2.2 today HGB- 11.2 today     Assessment & Plan:  Rectal bleeding ANticoagulation  Continue to watch for increase bleeding- if reoccurs need to be seen again Recheck inr in 4 weeks  [place], FNP

## 2016-12-22 NOTE — Telephone Encounter (Signed)
Tambocor duplicate has been removed from med list.

## 2017-01-02 ENCOUNTER — Emergency Department (HOSPITAL_COMMUNITY): Payer: Medicare Other

## 2017-01-02 ENCOUNTER — Encounter (HOSPITAL_COMMUNITY): Payer: Self-pay | Admitting: Emergency Medicine

## 2017-01-02 ENCOUNTER — Emergency Department (HOSPITAL_COMMUNITY)
Admission: EM | Admit: 2017-01-02 | Discharge: 2017-01-02 | Disposition: A | Discharge status: Home / Self Care | Payer: Medicare Other | Attending: Emergency Medicine | Admitting: Emergency Medicine

## 2017-01-02 DIAGNOSIS — Z7901 Long term (current) use of anticoagulants: Secondary | ICD-10-CM | POA: Diagnosis not present

## 2017-01-02 DIAGNOSIS — I110 Hypertensive heart disease with heart failure: Secondary | ICD-10-CM | POA: Diagnosis not present

## 2017-01-02 DIAGNOSIS — Z96653 Presence of artificial knee joint, bilateral: Secondary | ICD-10-CM | POA: Diagnosis not present

## 2017-01-02 DIAGNOSIS — E039 Hypothyroidism, unspecified: Secondary | ICD-10-CM | POA: Insufficient documentation

## 2017-01-02 DIAGNOSIS — R002 Palpitations: Secondary | ICD-10-CM | POA: Diagnosis not present

## 2017-01-02 DIAGNOSIS — Z79899 Other long term (current) drug therapy: Secondary | ICD-10-CM | POA: Diagnosis not present

## 2017-01-02 DIAGNOSIS — Z96612 Presence of left artificial shoulder joint: Secondary | ICD-10-CM | POA: Insufficient documentation

## 2017-01-02 DIAGNOSIS — Z8673 Personal history of transient ischemic attack (TIA), and cerebral infarction without residual deficits: Secondary | ICD-10-CM | POA: Diagnosis not present

## 2017-01-02 DIAGNOSIS — I5030 Unspecified diastolic (congestive) heart failure: Secondary | ICD-10-CM | POA: Diagnosis not present

## 2017-01-02 LAB — CBC
HCT: 35.7 % — ABNORMAL LOW (ref 36.0–46.0)
HEMOGLOBIN: 11.7 g/dL — AB (ref 12.0–15.0)
MCH: 30.7 pg (ref 26.0–34.0)
MCHC: 32.8 g/dL (ref 30.0–36.0)
MCV: 93.7 fL (ref 78.0–100.0)
PLATELETS: 304 10*3/uL (ref 150–400)
RBC: 3.81 MIL/uL — AB (ref 3.87–5.11)
RDW: 14.7 % (ref 11.5–15.5)
WBC: 5.5 10*3/uL (ref 4.0–10.5)

## 2017-01-02 LAB — BASIC METABOLIC PANEL
ANION GAP: 11 (ref 5–15)
BUN: 11 mg/dL (ref 6–20)
CALCIUM: 8.3 mg/dL — AB (ref 8.9–10.3)
CHLORIDE: 96 mmol/L — AB (ref 101–111)
CO2: 25 mmol/L (ref 22–32)
CREATININE: 0.71 mg/dL (ref 0.44–1.00)
GFR calc non Af Amer: >60 mL/min (ref >60)
Glucose, Bld: 103 mg/dL — ABNORMAL HIGH (ref 65–99)
Potassium: 4 mmol/L (ref 3.5–5.1)
SODIUM: 132 mmol/L — AB (ref 135–145)

## 2017-01-02 LAB — PROTIME-INR
INR: 3.45
PROTHROMBIN TIME: 34.5 s — AB (ref 11.4–15.2)

## 2017-01-02 LAB — TROPONIN I

## 2017-01-02 NOTE — ED Provider Notes (Signed)
[facility] EMERGENCY DEPARTMENT Provider Note   CSN: [id number] Arrival date & time: 01/02/17  0865     History   Chief Complaint Chief Complaint  Patient presents with  . Tachycardia    HPI [name] is a 83 y.o. female.  The patient awoke at 4 AM today with a sense of heart palpitations and feeling weak.  No substernal chest pain, dyspnea, fever, cough.  She has a history of atrial fibrillation is currently on flecainide and Coumadin.  She is feeling better now.  Severity of symptoms is mild to moderate.  Nothing makes symptoms better or worse.      Past Medical History:  Diagnosis Date  . Anemia   . Atrial fibrillation (HCC)    Paroxysmal, Flecainide therapy  . Calf pain    September, 2012, at rest  . Cardiomegaly    EF normal, echo, 2003  //   EF 60-65%, echo, June 21, 7844, diastolic dysfunction,  . Carotid bruit    Doppler, December, 2009, no abnormality  . Cataract   . Diverticulosis   . GERD (gastroesophageal reflux disease)   . History of shingles   . HLD (hyperlipidemia)   . HTN (hypertension)   . Hypothyroidism   . Hypothyroidism   . Insomnia   . Normal nuclear stress test    Normal, 2001  . Osteoarthritis   . PONV (postoperative nausea and vomiting)   . Rectal bleeding 2001   diverticulosis and int hemorrhoids on 07/1999 and 02/2010 colonoscopies.  . Stroke ([place])   . Warfarin anticoagulation     Patient Active Problem List   Diagnosis Date Noted  . Abnormal nuclear cardiac imaging test   . Unstable angina ([place]) 06/16/2016  . Atrial flutter ([place]) 06/16/2016  . Abnormal stress test 06/16/2016  . Frequent urination 05/10/2016  . Acute cystitis with hematuria 05/10/2016  . Chest pain 04/23/2016  . Hypomagnesemia 04/23/2016  . AP (abdominal pain)   . Melena 09/23/2015  . Overactive bladder 05/02/2015  . Acute cerebrovascular accident (CVA) ([place]) 04/21/2015  . Acute cardioembolic stroke ([place]) 96/29/5284  . Healthcare maintenance  02/20/2015  . History of dysarthria 02/20/2015  . Enteritis due to Clostridium difficile 01/27/2015  . HTN (hypertension) 01/11/2015  . Hyponatremia 01/11/2015  . Rectal bleeding 01/11/2015  . Chronic pain syndrome 11/25/2014  . Insomnia 11/25/2014  . Chronic ulcer of toe ([place]) 11/13/2014  . Diastolic dysfunction 13/24/4010  . Paroxysmal atrial fibrillation ([place]) 10/14/2014  . Warfarin anticoagulation   . Hypothyroidism   . Carotid bruit   . Normal nuclear stress test   . S/P knee replacement   . UNSPECIFIED ANEMIA 01/19/2010  . DIVERTICULOSIS-COLON 01/19/2010    Past Surgical History:  Procedure Laterality Date  . BIOPSY  09/29/2015   Procedure: BIOPSY;  Surgeon: Rogene Houston, MD;  [facility]: [facility];  Service: Endoscopy;;  gastric  . CATARACT EXTRACTION W/PHACO Right 04/15/2014   Procedure: CATARACT EXTRACTION PHACO AND INTRAOCULAR LENS PLACEMENT (IOC);  Surgeon: Tonny Branch, MD;  Location: [facility] ORS;  Service: Ophthalmology;  Laterality: Right;  CDE: 13.30  . CATARACT EXTRACTION W/PHACO Left 05/13/2014   Procedure: CATARACT EXTRACTION PHACO AND INTRAOCULAR LENS PLACEMENT LEFT EYE;  Surgeon: Tonny Branch, MD;  Location: [facility];  Service: Ophthalmology;  Laterality: Left;  CDE:13.00  . CHOLECYSTECTOMY N/A 01/15/2015   Procedure: LAPAROSCOPIC CHOLECYSTECTOMY;  Surgeon: Ralene Ok, MD;  Location: [place];  Service: General;  Laterality: N/A;  . COLONOSCOPY N/A 09/29/2015   Procedure: COLONOSCOPY;  Surgeon: Rogene Houston, MD;  [facility];  Service: Endoscopy;  Laterality: N/A;  . COLONOSCOPY    . ESOPHAGOGASTRODUODENOSCOPY N/A 01/13/2015   Procedure: ESOPHAGOGASTRODUODENOSCOPY (EGD);  Surgeon: Jerene Bears, MD;  Location: [facility] ENDOSCOPY;  Service: Endoscopy;  Laterality: N/A;  . ESOPHAGOGASTRODUODENOSCOPY N/A 09/29/2015   Procedure: ESOPHAGOGASTRODUODENOSCOPY (EGD);  Surgeon: Rogene Houston, MD;  [facility];  Service: Endoscopy;  Laterality: N/A;  12:15  .  GALLBLADDER SURGERY    . LEFT HEART CATH AND CORONARY ANGIOGRAPHY N/A 06/22/2016   Procedure: Left Heart Cath and Coronary Angiography;  Surgeon: Troy Sine, MD;  Location: [place] [facility];  Service: Cardiovascular;  Laterality: N/A;  . SHOULDER OPEN ROTATOR CUFF REPAIR     bilateral  . TOTAL KNEE ARTHROPLASTY  05/26/10   right  . TOTAL KNEE ARTHROPLASTY  11/17/2010   Procedure: TOTAL KNEE ARTHROPLASTY;  Surgeon: Mauri Pole;  Location: [facility];  Service: Orthopedics;  Laterality: Left;  . TOTAL KNEE ARTHROPLASTY     Right  . TOTAL SHOULDER REPLACEMENT  01/2010   left    OB History    Gravida Para Term Preterm AB Living   1 1 1          SAB TAB Ectopic Multiple Live Births                   Home Medications    Prior to Admission medications   Medication Sig Start Date End Date Taking? Authorizing Provider  atenolol (TENORMIN) 50 MG tablet Take 50 mg by mouth daily.    [provider]  atorvastatin (LIPITOR) 20 MG tablet TAKE 1 TABLET BY MOUTH AT  BEDTIME 10/08/16   Timmothy Euler, MD  dicyclomine (BENTYL) 10 MG capsule TAKE 1 CAPSULE BY MOUTH 4  TIMES DAILY BEFORE MEALS  AND AT BEDTIME 12/20/16   Timmothy Euler, MD  flecainide (TAMBOCOR) 100 MG tablet TAKE 1 TABLET BY MOUTH TWO  TIMES DAILY Patient taking differently: take 1/2 tablet twice daily 11/16/16   Timmothy Euler, MD  furosemide (LASIX) 40 MG tablet TAKE 1/2 TABLET BY MOUTH  EVERY MORNING , THEN TAKE 1 TABLET BY MOUTH IF SWELLING OCCURS 11/23/16   Timmothy Euler, MD  irbesartan (AVAPRO) 150 MG tablet Take 1 tablet (150 mg total) by mouth daily. 10/15/16   Timmothy Euler, MD  levETIRAcetam (KEPPRA) 250 MG tablet Take 1 tablet (250 mg total) 2 (two) times daily by mouth. 11/12/16   Timmothy Euler, MD  levothyroxine (SYNTHROID, LEVOTHROID) 50 MCG tablet Take 50 mcg by mouth daily before breakfast.  06/03/16   [provider]  pantoprazole (PROTONIX) 40 MG tablet TAKE 1 TABLET  DAILY 10/27/16   Timmothy Euler, MD  potassium chloride SA (K-DUR,KLOR-CON) 20 MEQ tablet Take 20 mEq by mouth daily.    [provider]  warfarin (COUMADIN) 5 MG tablet Take 5 mg by mouth daily.    [provider]  zolpidem (AMBIEN) 5 MG tablet TAKE 1/2 TO 1 TABLET AT BEDTIME AS NEEDED FOR INSOMNIA 09/17/16   Timmothy Euler, MD    Family History Family History  Problem Relation Age of Onset  . COPD Brother   . Atrial fibrillation Brother   . Diabetes Brother   . Liver cancer Brother        Alcohol-related  . Colon cancer Brother   . Heart attack Father   . Early death Father   .  Rheum arthritis Mother   . Arthritis Mother   . Heart disease Mother   . Coronary artery disease Brother   . Heart attack Daughter   . Gallbladder disease Brother     Social History Social History   Tobacco Use  . Smoking status: Never Smoker  . Smokeless tobacco: Never Used  Substance Use Topics  . Alcohol use: No  . Drug use: No     Allergies   Penicillins   Review of Systems Review of Systems  All other systems reviewed and are negative.    Physical Exam Updated Vital Signs BP (!) 150/72   Pulse (!) 37   Temp 97.8 F (36.6 C) (Oral)   Resp 15   Ht 5\' 2"  (1.575 m)   Wt 63.5 kg (140 lb)   SpO2 98%   BMI 25.61 kg/m   Physical Exam  Constitutional: She is oriented to person, place, and time. She appears well-developed and well-nourished.  HENT:  Head: Normocephalic and atraumatic.  Eyes: Conjunctivae are normal.  Neck: Neck supple.  Cardiovascular: Normal rate.  Some ectopy  Pulmonary/Chest: Effort normal and breath sounds normal.  Abdominal: Soft. Bowel sounds are normal.  Musculoskeletal: Normal range of motion.  Neurological: She is alert and oriented to person, place, and time.  Skin: Skin is warm and dry.  Psychiatric: She has a normal mood and affect. Her behavior is normal.  Nursing note and vitals reviewed.    ED Treatments /  Results  Labs (all labs ordered are listed, but only abnormal results are displayed) Labs Reviewed  BASIC METABOLIC PANEL - Abnormal; Notable for the following components:      Result Value   Sodium 132 (*)    Chloride 96 (*)    Glucose, Bld 103 (*)    Calcium 8.3 (*)    All other components within normal limits  CBC - Abnormal; Notable for the following components:   RBC 3.81 (*)    Hemoglobin 11.7 (*)    HCT 35.7 (*)    All other components within normal limits  PROTIME-INR - Abnormal; Notable for the following components:   Prothrombin Time 34.5 (*)    All other components within normal limits  TROPONIN I    EKG  EKG Interpretation  Date/Time:  Sunday January 02 2017 07:44:14 EST Ventricular Rate:  64 PR Interval:    QRS Duration: 100 QT Interval:  416 QTC Calculation: 430 R Axis:   70 Text Interpretation:  Sinus or ectopic atrial rhythm Ventricular bigeminy Prolonged PR interval Anteroseptal infarct, old Confirmed by Nat Christen [phone]) on 01/02/2017 8:17:07 AM       Radiology Dg Chest 2 View  Result Date: 01/02/2017 CLINICAL DATA:  Palpitations. EXAM: CHEST  2 VIEW COMPARISON:  04/28/2016 FINDINGS: The heart size and mediastinal contours are within normal limits. Both lungs are clear. Previous left shoulder arthroplasty. IMPRESSION: No active cardiopulmonary disease. Electronically Signed   By: Kerby Moors M.D.   On: 01/02/2017 08:51    Procedures Procedures (including critical care time)  Medications Ordered in ED Medications - No data to display   Initial Impression / Assessment and Plan / ED Course  I have reviewed the triage vital signs and the nursing notes.  Pertinent labs & imaging results that were available during my care of the patient were reviewed by me and considered in my medical decision making (see chart for details).    Patient is hemodynamically stable.  Labs, EKG, chest x-ray showed no  life-threatening condition.  Patient was  observed for approximately 4 hours on the cardiac monitor.  She is stable for discharge.  Will follow up with her primary care doctor this week.   Final Clinical Impressions(s) / ED Diagnoses   Final diagnoses:  Palpitations    ED Discharge Orders    None       Nat Christen, MD 01/02/17 1133

## 2017-01-02 NOTE — ED Triage Notes (Signed)
Pt reports feeling weak and feeling like her heart was skipping.  States she feels like this when her afib acts up.  Took aspirin 325mg  pta.

## 2017-01-02 NOTE — Discharge Instructions (Signed)
Test showed no life-threatening condition.  Follow-up your primary care doctor this week.

## 2017-01-06 ENCOUNTER — Ambulatory Visit (INDEPENDENT_AMBULATORY_CARE_PROVIDER_SITE_OTHER): Payer: Medicare Other | Admitting: *Deleted

## 2017-01-06 DIAGNOSIS — I480 Paroxysmal atrial fibrillation: Secondary | ICD-10-CM

## 2017-01-06 LAB — COAGUCHEK XS/INR WAIVED
INR: 4.5 — ABNORMAL HIGH (ref 0.9–1.1)
PROTHROMBIN TIME: 54.6 s

## 2017-01-06 NOTE — Progress Notes (Signed)
Subjective:     Indication: atrial fibrillation Bleeding signs/symptoms: None Thromboembolic signs/symptoms: None  Missed Coumadin doses: None Medication changes: no Dietary changes: no Bacterial/viral infection: no Other concerns: yes - Patient is scheduled to take 5 mg on Mon, Wed, and Fri and 2.5 mg all other days. She said that she believes she is taking 5mg  everyday. If that is true that would be the cause for the elevation. She has some concerns about her memory and I have had some concerns as well.   The following portions of the patient's history were reviewed and updated as appropriate: allergies, current medications and past medical history. Hospital notes from 01/02/17 reviewed.   Review of Systems Pertinent items are noted in HPI.   Objective:    INR Today: 4.5 Current dose: 5mg  every Mon, Wed, and Fri and 2.5 mg all other days. Patient thinks she has been taking 5mg  daily.   INR at hosp on 12/30 was 3.45. No adjustments made.   Minicog done. Patient was able to draw a clock outline and placed numbers in order but they were not placed properly on the clock. Was not able to indicate 5:30 with the hands. She remembered 2 out of the 3 words that I asked her to repeat and remember and she was able to spell WORLD backwards.   Assessment:    Supratherapeutic INR for goal of 2-3   Plan:    1. New dose: no change today. Patient has taken her dose for today. I asked her to call me tomorrow and let me know how she has been taking her coumadin. She keeps it in a weekly pillbox. I also told her husband the plan and provided my name and phone number on her AVS.   2. Next INR: Will determine after phone call tomorrow  Appt scheduled with Dr Wendi Snipes to discuss her memory.      [name], RN  Discussed and managed with Nurse Cori Razor, I agree with her documentation above.   Laroy Apple, MD [facility] 01/17/2017, 12:46 PM

## 2017-01-10 ENCOUNTER — Other Ambulatory Visit: Payer: Self-pay | Admitting: Family Medicine

## 2017-01-10 IMAGING — DX DG RIBS W/ CHEST 3+V*L*
5 series · 5 of 5 positions shown · non-contrast
Comparison: July 31, 2015

CLINICAL DATA: Pain after fall.

EXAM:
LEFT RIBS AND CHEST - 3+ VIEW

[chest pa]
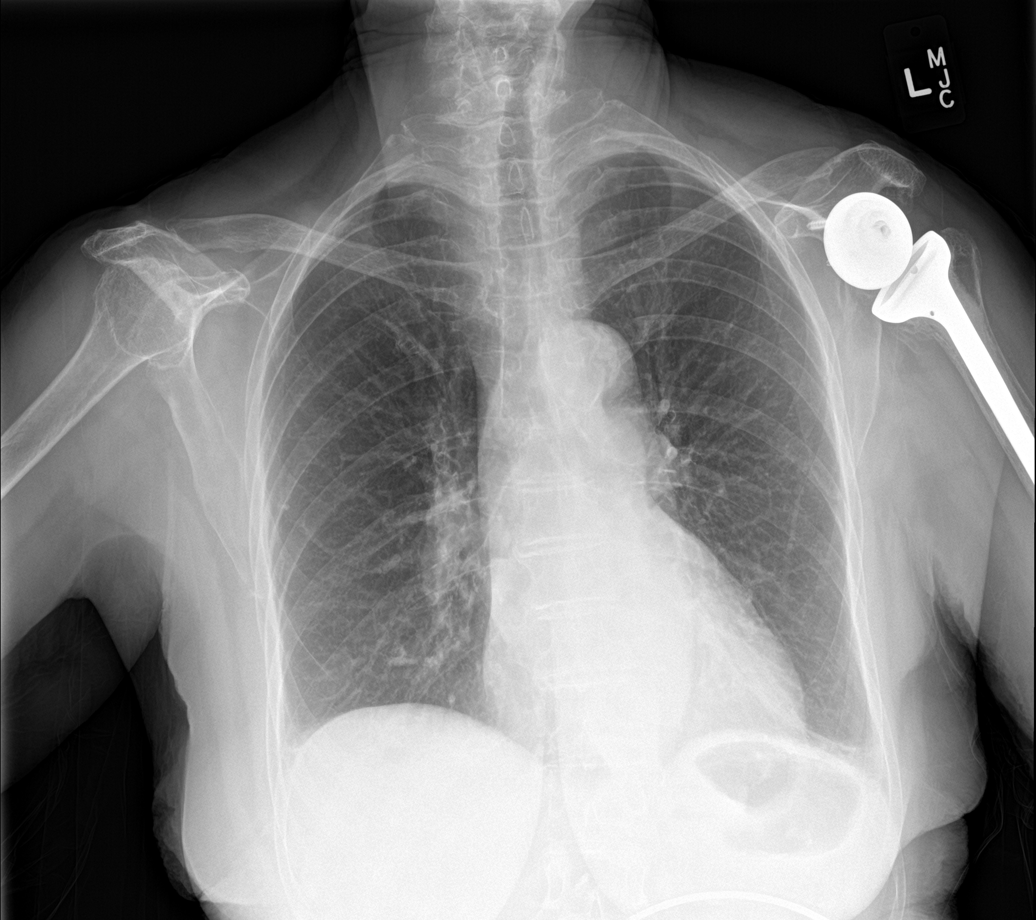

[rib pa obl (1 of 2)]
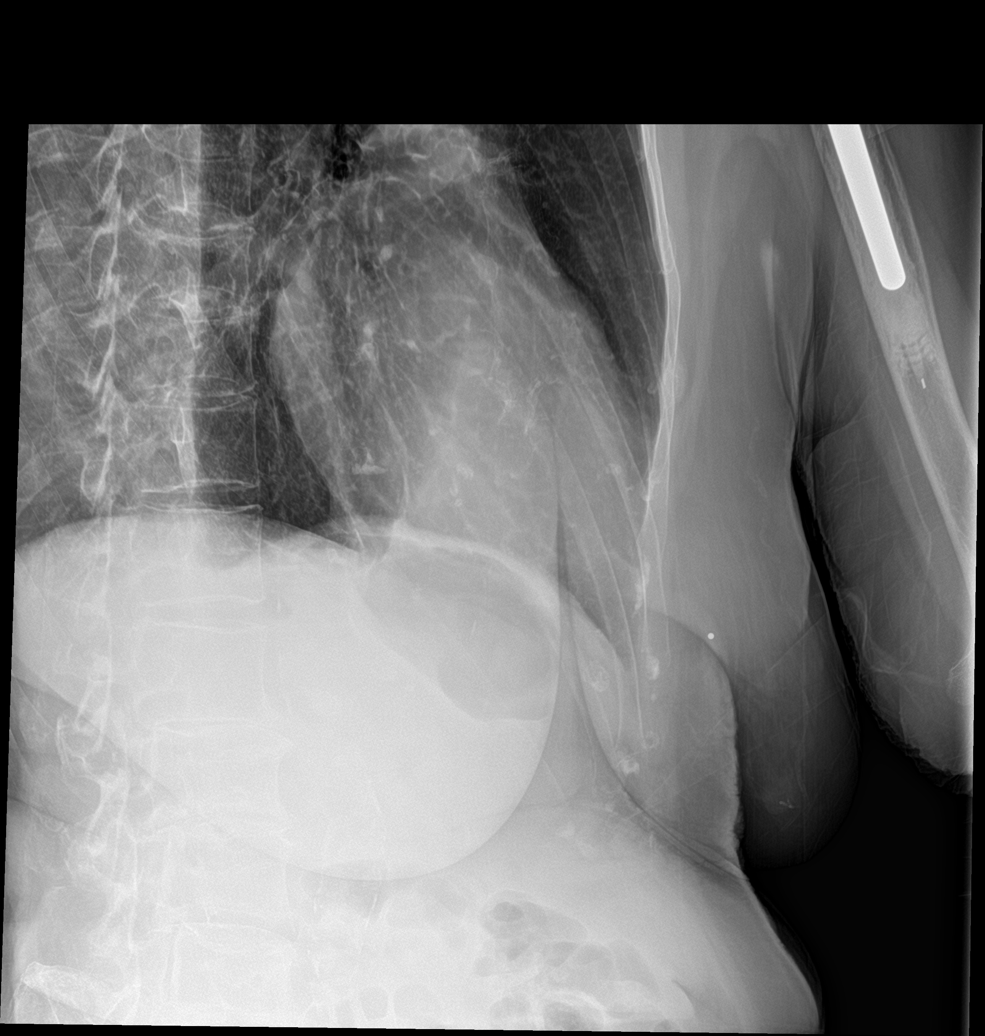

[rib pa obl (2 of 2)]
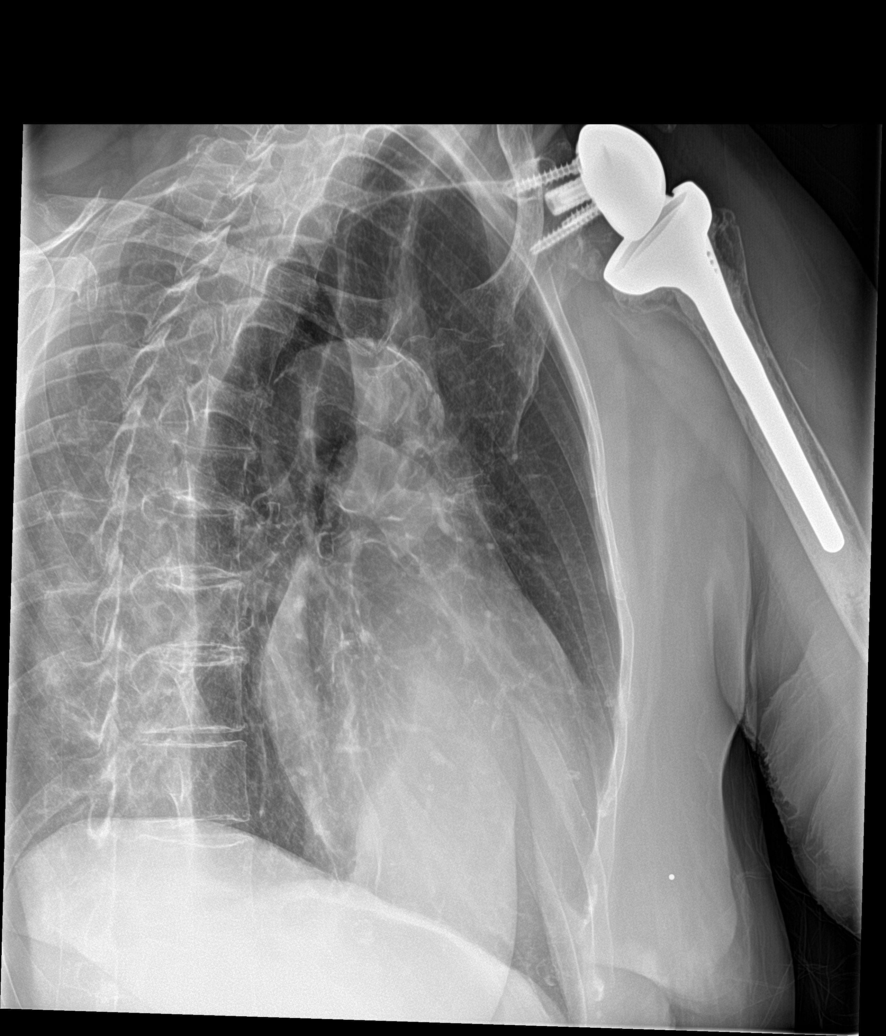

[rib pa (1 of 2)]
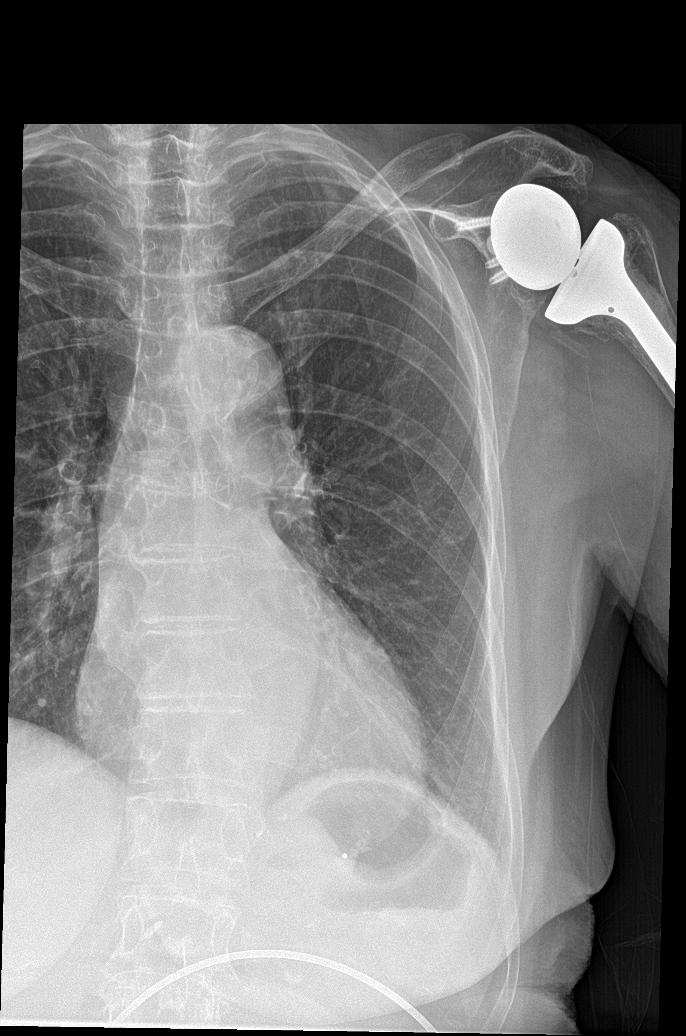

[rib pa (2 of 2)]
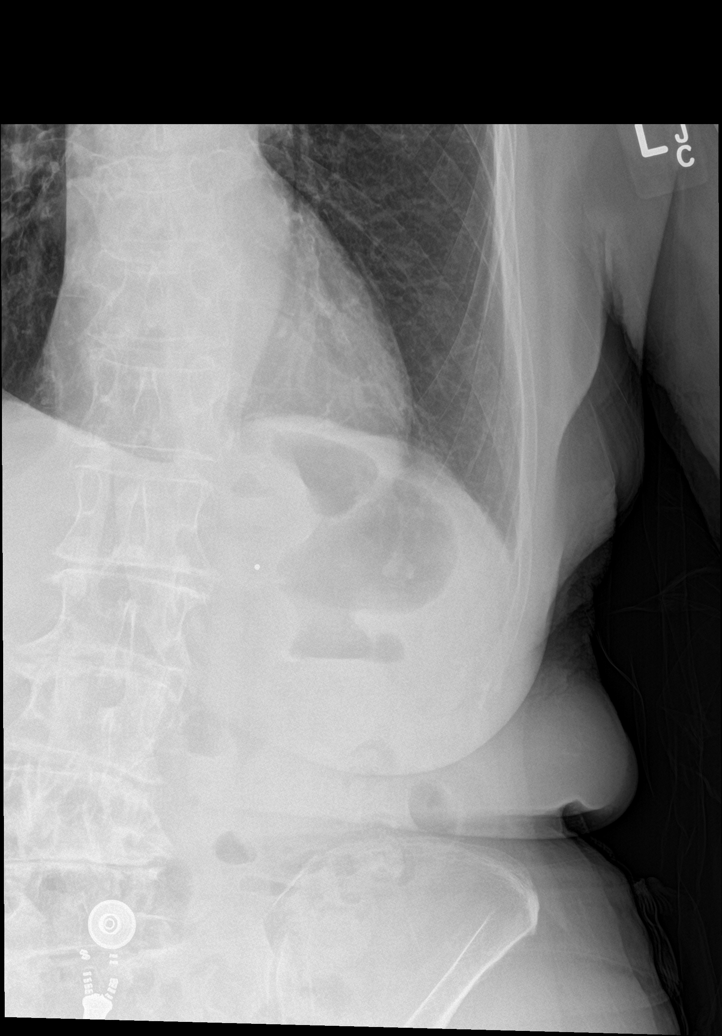

[5 of 5 positions shown; findings below may reference images not displayed]

FINDINGS: Minimal atelectasis in the left lung base. The heart, hila, and
mediastinum are normal. No suspicious pulmonary nodules or masses.
No focal infiltrates. The patient is status post left shoulder
replacement. For shortening of the left clavicle is likely from
previous trauma and is unchanged.

No pneumothorax.  No rib fractures are seen.
IMPRESSION: No acute abnormalities.  No rib fracture or pneumothorax.

## 2017-01-18 ENCOUNTER — Encounter: Payer: Self-pay | Admitting: Family Medicine

## 2017-01-18 ENCOUNTER — Ambulatory Visit (INDEPENDENT_AMBULATORY_CARE_PROVIDER_SITE_OTHER): Payer: Medicare Other | Admitting: Family Medicine

## 2017-01-18 VITALS — BP 139/75 | HR 75 | Temp 97.6°F | Ht 62.0 in | Wt 139.0 lb

## 2017-01-18 DIAGNOSIS — Z7901 Long term (current) use of anticoagulants: Secondary | ICD-10-CM

## 2017-01-18 DIAGNOSIS — G4700 Insomnia, unspecified: Secondary | ICD-10-CM

## 2017-01-18 DIAGNOSIS — R413 Other amnesia: Secondary | ICD-10-CM | POA: Diagnosis not present

## 2017-01-18 LAB — COAGUCHEK XS/INR WAIVED
INR: 2.8 — ABNORMAL HIGH (ref 0.9–1.1)
Prothrombin Time: 33.5 s

## 2017-01-18 MED ORDER — TRAZODONE HCL 50 MG PO TABS: 50.0000 mg | ORAL_TABLET | Freq: Every evening | ORAL | 3 refills | Status: DC | PRN

## 2017-01-18 NOTE — Progress Notes (Signed)
   HPI  Patient presents today here for follow-up chronic medical conditions.  Patient has atrial flutter and is anticoagulated with Coumadin.  She denies any bleeding.  She reports good medication compliance.  Memory loss Mild to moderate, patient is beginning to notice. She has not really talked to her family about her memory loss.  Difficulty sleeping Patient is a long history of insomnia, she is used Ambien for several years. She states that 2-4 days a week she uses a one half tab of Ambien with only mild improvement. She is willing to try something different.  PMH: Smoking status noted ROS: Per HPI  Objective: BP 139/75   Pulse 75   Temp 97.6 F (36.4 C) (Oral)   Ht 5\' 2"  (1.575 m)   Wt 139 lb (63 kg)   BMI 25.42 kg/m  Gen: NAD, alert, cooperative with exam HEENT: NCAT CV: RRR, good S1/S2, no murmur Resp: CTABL, no wheezes, non-labored Ext: No edema, warm Neuro: Alert and oriented, No gross deficits  MMSE - Mini Mental State Exam 01/18/2017 09/20/2016 12/16/2014  Orientation to time 2 4 4   Orientation to Place 5 5 5   Registration 3 3 3   Attention/ Calculation 5 5 3   Recall 0 0 3  Language- name 2 objects 2 2 2   Language- repeat 1 1 1   Language- follow 3 step command 3 3 3   Language- read & follow direction 1 1 0  Write a sentence 1 1 1   Copy design 1 0 0  Total score 24 25 25      Assessment and plan:  #memory loss Patient now with MMSE consistent with mild dementia, previous MRIs have been normal. She does have a history of admission for presumed CVA, however this was ruled out with multiple imaging and neurology did not feel that it was a true CVA.   #Insomnia With memory loss I recommended discontinuing Ambien, she is willing to try trazodone   #Coumadin anticoagulation, atrial flutter Patient is rate controlled today, she's worked with cardiology and is controlled with beta-blocker plus flecainide INR is therapeutic today, repeat in 4 weeks. No  Coumadin changes    Orders Placed This Encounter  Procedures  . CoaguChek XS/INR Waived    Meds ordered this encounter  Medications  . traZODone (DESYREL) 50 MG tablet    Sig: Take 1 tablet (50 mg total) by mouth at bedtime as needed for sleep.    Dispense:  30 tablet    Refill:  Low Moor, MD [place] 01/18/2017, 11:54 AM

## 2017-01-18 NOTE — Patient Instructions (Signed)
Great to see you!  Come back in 4 months to see me to follow up for your thyroid  Come back to have your INR checked in 4 weeks

## 2017-01-24 ENCOUNTER — Other Ambulatory Visit: Payer: Self-pay | Admitting: Family Medicine

## 2017-01-27 ENCOUNTER — Observation Stay (HOSPITAL_COMMUNITY)
Admission: EM | Admit: 2017-01-27 | Discharge: 2017-01-28 | Disposition: A | Discharge status: Home / Self Care | Payer: Medicare Other | Attending: Family Medicine | Admitting: Family Medicine

## 2017-01-27 ENCOUNTER — Encounter (HOSPITAL_COMMUNITY): Payer: Self-pay

## 2017-01-27 ENCOUNTER — Other Ambulatory Visit: Payer: Self-pay

## 2017-01-27 DIAGNOSIS — E871 Hypo-osmolality and hyponatremia: Secondary | ICD-10-CM | POA: Diagnosis not present

## 2017-01-27 DIAGNOSIS — I4581 Long QT syndrome: Secondary | ICD-10-CM | POA: Diagnosis not present

## 2017-01-27 DIAGNOSIS — E039 Hypothyroidism, unspecified: Secondary | ICD-10-CM | POA: Insufficient documentation

## 2017-01-27 DIAGNOSIS — Z79899 Other long term (current) drug therapy: Secondary | ICD-10-CM | POA: Diagnosis not present

## 2017-01-27 DIAGNOSIS — I480 Paroxysmal atrial fibrillation: Secondary | ICD-10-CM | POA: Insufficient documentation

## 2017-01-27 DIAGNOSIS — Z96653 Presence of artificial knee joint, bilateral: Secondary | ICD-10-CM | POA: Insufficient documentation

## 2017-01-27 DIAGNOSIS — R079 Chest pain, unspecified: Principal | ICD-10-CM | POA: Insufficient documentation

## 2017-01-27 DIAGNOSIS — D6832 Hemorrhagic disorder due to extrinsic circulating anticoagulants: Secondary | ICD-10-CM | POA: Insufficient documentation

## 2017-01-27 DIAGNOSIS — Z7901 Long term (current) use of anticoagulants: Secondary | ICD-10-CM | POA: Insufficient documentation

## 2017-01-27 DIAGNOSIS — I5032 Chronic diastolic (congestive) heart failure: Secondary | ICD-10-CM | POA: Diagnosis not present

## 2017-01-27 DIAGNOSIS — R791 Abnormal coagulation profile: Secondary | ICD-10-CM | POA: Diagnosis present

## 2017-01-27 DIAGNOSIS — Z96612 Presence of left artificial shoulder joint: Secondary | ICD-10-CM | POA: Diagnosis not present

## 2017-01-27 DIAGNOSIS — I10 Essential (primary) hypertension: Secondary | ICD-10-CM | POA: Diagnosis not present

## 2017-01-27 DIAGNOSIS — R9431 Abnormal electrocardiogram [ECG] [EKG]: Secondary | ICD-10-CM | POA: Diagnosis present

## 2017-01-27 NOTE — ED Triage Notes (Signed)
Pt c/o chest pain that goes through to her back and increased heart rate onset approx 6 pm

## 2017-01-28 ENCOUNTER — Encounter (HOSPITAL_COMMUNITY): Payer: Self-pay | Admitting: Family Medicine

## 2017-01-28 ENCOUNTER — Observation Stay (HOSPITAL_COMMUNITY): Payer: Medicare Other

## 2017-01-28 ENCOUNTER — Other Ambulatory Visit: Payer: Self-pay

## 2017-01-28 DIAGNOSIS — R791 Abnormal coagulation profile: Secondary | ICD-10-CM | POA: Diagnosis not present

## 2017-01-28 DIAGNOSIS — E871 Hypo-osmolality and hyponatremia: Secondary | ICD-10-CM | POA: Diagnosis not present

## 2017-01-28 DIAGNOSIS — I5032 Chronic diastolic (congestive) heart failure: Secondary | ICD-10-CM | POA: Diagnosis not present

## 2017-01-28 DIAGNOSIS — R9431 Abnormal electrocardiogram [ECG] [EKG]: Secondary | ICD-10-CM

## 2017-01-28 DIAGNOSIS — I480 Paroxysmal atrial fibrillation: Secondary | ICD-10-CM | POA: Diagnosis not present

## 2017-01-28 DIAGNOSIS — R079 Chest pain, unspecified: Secondary | ICD-10-CM | POA: Diagnosis not present

## 2017-01-28 DIAGNOSIS — I10 Essential (primary) hypertension: Secondary | ICD-10-CM | POA: Diagnosis not present

## 2017-01-28 LAB — BASIC METABOLIC PANEL
ANION GAP: 10 (ref 5–15)
BUN: 8 mg/dL (ref 6–20)
CALCIUM: 8.2 mg/dL — AB (ref 8.9–10.3)
CHLORIDE: 93 mmol/L — AB (ref 101–111)
CO2: 25 mmol/L (ref 22–32)
CREATININE: 0.51 mg/dL (ref 0.44–1.00)
GFR calc non Af Amer: >60 mL/min (ref >60)
Glucose, Bld: 102 mg/dL — ABNORMAL HIGH (ref 65–99)
Potassium: 3.6 mmol/L (ref 3.5–5.1)
SODIUM: 128 mmol/L — AB (ref 135–145)

## 2017-01-28 LAB — COMPREHENSIVE METABOLIC PANEL
ALT: 16 U/L (ref 14–54)
ANION GAP: 12 (ref 5–15)
AST: 20 U/L (ref 15–41)
Albumin: 3.7 g/dL (ref 3.5–5.0)
Alkaline Phosphatase: 56 U/L (ref 38–126)
BUN: 9 mg/dL (ref 6–20)
CO2: 24 mmol/L (ref 22–32)
Calcium: 8.1 mg/dL — ABNORMAL LOW (ref 8.9–10.3)
Chloride: 89 mmol/L — ABNORMAL LOW (ref 101–111)
Creatinine, Ser: 0.59 mg/dL (ref 0.44–1.00)
GFR calc non Af Amer: >60 mL/min (ref >60)
Glucose, Bld: 106 mg/dL — ABNORMAL HIGH (ref 65–99)
POTASSIUM: 3.6 mmol/L (ref 3.5–5.1)
SODIUM: 125 mmol/L — AB (ref 135–145)
Total Bilirubin: 0.5 mg/dL (ref 0.3–1.2)
Total Protein: 6.7 g/dL (ref 6.5–8.1)

## 2017-01-28 LAB — PROTIME-INR
INR: 4.23
INR: 4.41 — AB
PROTHROMBIN TIME: 41.8 s — AB (ref 11.4–15.2)
Prothrombin Time: 40.5 seconds — ABNORMAL HIGH (ref 11.4–15.2)

## 2017-01-28 LAB — TROPONIN I
Troponin I: <0.03 ng/mL (ref <0.03)
Troponin I: <0.03 ng/mL (ref <0.03)

## 2017-01-28 LAB — TSH: TSH: 2.963 u[IU]/mL (ref 0.350–4.500)

## 2017-01-28 LAB — CBC WITH DIFFERENTIAL/PLATELET
BASOS ABS: 0 10*3/uL (ref 0.0–0.1)
BASOS PCT: 1 %
Eosinophils Absolute: 0.1 10*3/uL (ref 0.0–0.7)
Eosinophils Relative: 2 %
HEMATOCRIT: 35.8 % — AB (ref 36.0–46.0)
HEMOGLOBIN: 12 g/dL (ref 12.0–15.0)
LYMPHS PCT: 25 %
Lymphs Abs: 1.6 10*3/uL (ref 0.7–4.0)
MCH: 30.8 pg (ref 26.0–34.0)
MCHC: 33.5 g/dL (ref 30.0–36.0)
MCV: 92 fL (ref 78.0–100.0)
MONOS PCT: 15 %
Monocytes Absolute: 1 10*3/uL (ref 0.1–1.0)
NEUTROS ABS: 3.7 10*3/uL (ref 1.7–7.7)
NEUTROS PCT: 57 %
Platelets: 334 10*3/uL (ref 150–400)
RBC: 3.89 MIL/uL (ref 3.87–5.11)
RDW: 13.9 % (ref 11.5–15.5)
WBC: 6.4 10*3/uL (ref 4.0–10.5)

## 2017-01-28 LAB — MAGNESIUM: Magnesium: 1.7 mg/dL (ref 1.7–2.4)

## 2017-01-28 MED ORDER — ATENOLOL 25 MG PO TABS
50.0000 mg | ORAL_TABLET | Freq: Every day | ORAL | Status: DC
  Administered 2017-01-28: 50 mg via ORAL
  Filled 2017-01-28: qty 2

## 2017-01-28 MED ORDER — ATORVASTATIN CALCIUM 20 MG PO TABS: 20.0000 mg | ORAL_TABLET | Freq: Every day | ORAL | Status: DC

## 2017-01-28 MED ORDER — FLECAINIDE ACETATE 50 MG PO TABS
50.0000 mg | ORAL_TABLET | Freq: Two times a day (BID) | ORAL | Status: DC
  Administered 2017-01-28: 50 mg via ORAL
  Filled 2017-01-28 (×2): qty 1

## 2017-01-28 MED ORDER — POTASSIUM CHLORIDE CRYS ER 20 MEQ PO TBCR
30.0000 meq | EXTENDED_RELEASE_TABLET | Freq: Once | ORAL | Status: AC
  Administered 2017-01-28: 30 meq via ORAL
  Filled 2017-01-28: qty 2

## 2017-01-28 MED ORDER — ASPIRIN 81 MG PO TBEC: 81.0000 mg | DELAYED_RELEASE_TABLET | Freq: Every day | ORAL | 1 refills | Status: DC

## 2017-01-28 MED ORDER — FLECAINIDE ACETATE 50 MG PO TABS: 50.0000 mg | ORAL_TABLET | Freq: Two times a day (BID) | ORAL | 1 refills | Status: DC

## 2017-01-28 MED ORDER — ALPRAZOLAM 0.5 MG PO TABS: 0.2500 mg | ORAL_TABLET | Freq: Two times a day (BID) | ORAL | Status: DC | PRN

## 2017-01-28 MED ORDER — FUROSEMIDE 40 MG PO TABS
40.0000 mg | ORAL_TABLET | Freq: Every day | ORAL | Status: DC
  Administered 2017-01-28: 40 mg via ORAL
  Filled 2017-01-28: qty 1

## 2017-01-28 MED ORDER — ONDANSETRON HCL 4 MG/2ML IJ SOLN: 4.0000 mg | Freq: Four times a day (QID) | INTRAMUSCULAR | Status: DC | PRN

## 2017-01-28 MED ORDER — ASPIRIN EC 81 MG PO TBEC: 81.0000 mg | DELAYED_RELEASE_TABLET | Freq: Every day | ORAL | Status: DC

## 2017-01-28 MED ORDER — FLECAINIDE ACETATE 50 MG PO TABS: 50.0000 mg | ORAL_TABLET | Freq: Two times a day (BID) | ORAL | 2 refills | Status: DC

## 2017-01-28 MED ORDER — POTASSIUM CHLORIDE CRYS ER 20 MEQ PO TBCR
20.0000 meq | EXTENDED_RELEASE_TABLET | Freq: Every day | ORAL | Status: DC
  Administered 2017-01-28: 20 meq via ORAL
  Filled 2017-01-28: qty 1

## 2017-01-28 MED ORDER — ASPIRIN 81 MG PO CHEW
324.0000 mg | CHEWABLE_TABLET | ORAL | Status: AC
  Administered 2017-01-28: 324 mg via ORAL
  Filled 2017-01-28: qty 4

## 2017-01-28 MED ORDER — PANTOPRAZOLE SODIUM 40 MG PO TBEC: 40.0000 mg | DELAYED_RELEASE_TABLET | Freq: Every day | ORAL | Status: DC

## 2017-01-28 MED ORDER — TRAZODONE HCL 50 MG PO TABS: 50.0000 mg | ORAL_TABLET | Freq: Every evening | ORAL | Status: DC | PRN

## 2017-01-28 MED ORDER — ASPIRIN 300 MG RE SUPP: 300.0000 mg | RECTAL | Status: AC

## 2017-01-28 MED ORDER — IRBESARTAN 150 MG PO TABS
150.0000 mg | ORAL_TABLET | Freq: Every day | ORAL | Status: DC
  Administered 2017-01-28: 150 mg via ORAL
  Filled 2017-01-28: qty 1

## 2017-01-28 MED ORDER — MAGNESIUM SULFATE 2 GM/50ML IV SOLN
2.0000 g | Freq: Once | INTRAVENOUS | Status: AC
  Administered 2017-01-28: 2 g via INTRAVENOUS
  Filled 2017-01-28: qty 50

## 2017-01-28 MED ORDER — LEVOTHYROXINE SODIUM 50 MCG PO TABS
50.0000 ug | ORAL_TABLET | Freq: Every day | ORAL | Status: DC
  Administered 2017-01-28: 50 ug via ORAL
  Filled 2017-01-28: qty 1

## 2017-01-28 MED ORDER — ACETAMINOPHEN 325 MG PO TABS: 650.0000 mg | ORAL_TABLET | ORAL | Status: DC | PRN

## 2017-01-28 MED ORDER — NITROGLYCERIN 0.4 MG SL SUBL: 0.4000 mg | SUBLINGUAL_TABLET | SUBLINGUAL | Status: DC | PRN

## 2017-01-28 MED ORDER — LEVETIRACETAM 250 MG PO TABS
250.0000 mg | ORAL_TABLET | Freq: Two times a day (BID) | ORAL | Status: DC
  Administered 2017-01-28: 250 mg via ORAL
  Filled 2017-01-28: qty 1

## 2017-01-28 MED ORDER — ATENOLOL 50 MG PO TABS: 50.0000 mg | ORAL_TABLET | Freq: Every day | ORAL | 1 refills | Status: DC

## 2017-01-28 MED ORDER — WARFARIN SODIUM 5 MG PO TABS: 5.0000 mg | ORAL_TABLET | Freq: Every day | ORAL | 1 refills | Status: DC

## 2017-01-28 NOTE — Progress Notes (Signed)
Pt's INR now 4.41, MD made aware.

## 2017-01-28 NOTE — Progress Notes (Addendum)
Patient heart rate at rest 50s-60s.  Patient ambulated in hallway and heart rate increased to 70s and 80 at highest.  Patient tolerated well.  Denies pain, palpitations, shortness of breath.  Patient returned to bed.  Voices no complaints.   Dr. Denton Brick on unit and notified of above.

## 2017-01-28 NOTE — Consult Note (Signed)
Cardiology Consult    Patient ID: [name]; [id number]; 01/13/1932   Admit date: 01/27/2017 Date of Consult: 01/28/2017  Primary Care Provider: Timmothy Euler, MD Primary Cardiologist: Dr. Percival Spanish  Patient Profile    [name] is a 84 y.o. female with past medical history of PAF (on Coumadin), HTN, HLD, and CAD (minimal nonobstructive CAD by cath in 06/2016) who is being seen today for the evaluation of chest pain and palpitations at the request of Dr. Myna Hidalgo.   History of Present Illness    [name] presented to [facility] ED on 01/27/2017 for evaluation of chest pain and palpitations starting the day prior. She reports feeling a "fluttering in her chest" and knowing she was in atrial fibrillation and could not get her HR to slow down. She checked her HR at home on her BP cuff but is unable to remember the numbers she was obtaining. She took 5 ASA along with an extra half-tablet of Atenolol but her palpitations persisted, therefore she presented to the ED for further evaluation.   She denies any associated chest pain with the episode. Did have dyspnea. No recent orthopnea, PND, or lower extremity edema. Reports having these episodes 3-4 times per year. She remains on Coumadin for anticoagulation and denies any evidence of bleeding. INR has been variable over the past several months, as this was elevated to 7.7 in 11/2016 with intermittent times of being subtherapeutic since.   Initial EKG upon arrival to the ED showed she was in atrial fibrillation with HR of 107. Labs showed Na+ 125, K+ 3.6, creatinine 0.59, WBC 6.4, Hgb 12.0, platelets 334, INR 4.23. TSH at 2.963. Initial and delta troponin values have been negative.   She has been monitored on telemetry and spontaneously converted back to NSR overnight during the timeframe when telemetry was disconnected. She is now in NSR with HR in the 50's. Reports feeling "back to normal" and is asking when she can go home.   Past  Medical History:  Diagnosis Date  . Anemia   . Atrial fibrillation (HCC)    Paroxysmal, Flecainide therapy  . Calf pain    September, 2012, at rest  . Cardiomegaly    EF normal, echo, 2003  //   EF 60-65%, echo, June 22, 7351, diastolic dysfunction,  . Carotid bruit    Doppler, December, 2009, no abnormality  . Cataract   . Diverticulosis   . GERD (gastroesophageal reflux disease)   . History of shingles   . HLD (hyperlipidemia)   . HTN (hypertension)   . Hypothyroidism   . Hypothyroidism   . Insomnia   . Normal nuclear stress test    Normal, 2001  . Osteoarthritis   . PONV (postoperative nausea and vomiting)   . Rectal bleeding 2001   diverticulosis and int hemorrhoids on 07/1999 and 02/2010 colonoscopies.  . Stroke ([place])   . Warfarin anticoagulation     Past Surgical History:  Procedure Laterality Date  . BIOPSY  09/29/2015   Procedure: BIOPSY;  Surgeon: Rogene Houston, MD;  Location: [facility];  Service: Endoscopy;;  gastric  . CATARACT EXTRACTION W/PHACO Right 04/15/2014   Procedure: CATARACT EXTRACTION PHACO AND INTRAOCULAR LENS PLACEMENT (IOC);  Surgeon: Tonny Branch, MD;  Location: [facility];  Service: Ophthalmology;  Laterality: Right;  CDE: 13.30  . CATARACT EXTRACTION W/PHACO Left 05/13/2014   Procedure: CATARACT EXTRACTION PHACO AND INTRAOCULAR LENS PLACEMENT LEFT EYE;  Surgeon: Tonny Branch, MD;  Location: [facility];  Service: Ophthalmology;  Laterality: Left;  CDE:13.00  . CHOLECYSTECTOMY N/A 01/15/2015   Procedure: LAPAROSCOPIC CHOLECYSTECTOMY;  Surgeon: Ralene Ok, MD;  Location: [place];  Service: General;  Laterality: N/A;  . COLONOSCOPY N/A 09/29/2015   Procedure: COLONOSCOPY;  Surgeon: Rogene Houston, MD;  Location: [facility];  Service: Endoscopy;  Laterality: N/A;  . COLONOSCOPY    . ESOPHAGOGASTRODUODENOSCOPY N/A 01/13/2015   Procedure: ESOPHAGOGASTRODUODENOSCOPY (EGD);  Surgeon: Jerene Bears, MD;  Location: [facility] ENDOSCOPY;  Service: Endoscopy;  Laterality:  N/A;  . ESOPHAGOGASTRODUODENOSCOPY N/A 09/29/2015   Procedure: ESOPHAGOGASTRODUODENOSCOPY (EGD);  Surgeon: Rogene Houston, MD;  Location: [facility];  Service: Endoscopy;  Laterality: N/A;  12:15  . GALLBLADDER SURGERY    . LEFT HEART CATH AND CORONARY ANGIOGRAPHY N/A 06/22/2016   Procedure: Left Heart Cath and Coronary Angiography;  Surgeon: Troy Sine, MD;  Location: [facility];  Service: Cardiovascular;  Laterality: N/A;  . SHOULDER OPEN ROTATOR CUFF REPAIR     bilateral  . TOTAL KNEE ARTHROPLASTY  05/26/10   right  . TOTAL KNEE ARTHROPLASTY  11/17/2010   Procedure: TOTAL KNEE ARTHROPLASTY;  Surgeon: Mauri Pole;  Location: [facility];  Service: Orthopedics;  Laterality: Left;  . TOTAL KNEE ARTHROPLASTY     Right  . TOTAL SHOULDER REPLACEMENT  01/2010   left     Home Medications:  Prior to Admission medications   Medication Sig Start Date End Date Taking? Authorizing Provider  atenolol (TENORMIN) 50 MG tablet Take 50 mg by mouth daily.    [provider]  atorvastatin (LIPITOR) 20 MG tablet TAKE 1 TABLET BY MOUTH AT  BEDTIME 10/08/16   Timmothy Euler, MD  dicyclomine (BENTYL) 10 MG capsule TAKE 1 CAPSULE BY MOUTH 4  TIMES DAILY BEFORE MEALS  AND AT BEDTIME Patient not taking: Reported on 01/18/2017 12/20/16   Timmothy Euler, MD  flecainide (TAMBOCOR) 100 MG tablet TAKE 1 TABLET BY MOUTH TWO  TIMES DAILY Patient taking differently: take 1/2 tablet twice daily 11/16/16   Timmothy Euler, MD  flecainide (TAMBOCOR) 100 MG tablet TAKE 1 TABLET BY MOUTH TWO  TIMES DAILY 01/24/17   Timmothy Euler, MD  furosemide (LASIX) 40 MG tablet TAKE 1/2 TABLET BY MOUTH  EVERY MORNING , THEN TAKE 1 TABLET BY MOUTH IF SWELLING OCCURS 11/23/16   Timmothy Euler, MD  irbesartan (AVAPRO) 150 MG tablet Take 1 tablet (150 mg total) by mouth daily. 10/15/16   Timmothy Euler, MD  levETIRAcetam (KEPPRA) 250 MG tablet TAKE 1 TABLET  BY MOUTH 2  TIMES DAILY. 01/24/17    Timmothy Euler, MD  levothyroxine (SYNTHROID, LEVOTHROID) 50 MCG tablet Take 50 mcg by mouth daily before breakfast.  06/03/16   [provider]  pantoprazole (PROTONIX) 40 MG tablet TAKE 1 TABLET DAILY 01/11/17   Timmothy Euler, MD  potassium chloride SA (K-DUR,KLOR-CON) 20 MEQ tablet Take 20 mEq by mouth daily.    [provider]  traZODone (DESYREL) 50 MG tablet Take 1 tablet (50 mg total) by mouth at bedtime as needed for sleep. 01/18/17   Timmothy Euler, MD  warfarin (COUMADIN) 5 MG tablet Take 5 mg by mouth daily.    [provider]    Inpatient Medications: Scheduled Meds: . [START ON 01/29/2017] aspirin EC  81 mg Oral Daily  . atenolol  50 mg Oral Daily  . atorvastatin  20 mg Oral q1800  . flecainide  50 mg Oral BID  . furosemide  40 mg Oral Daily  . irbesartan  150 mg Oral Daily  . levETIRAcetam  250 mg Oral BID  . levothyroxine  50 mcg Oral QAC breakfast  . potassium chloride SA  20 mEq Oral Daily   Continuous Infusions:  PRN Meds: acetaminophen, ALPRAZolam, nitroGLYCERIN  Allergies:    Allergies  Allergen Reactions  . Penicillins Rash and Other (See Comments)    Has patient had a PCN reaction causing immediate rash, facial/tongue/throat swelling, SOB or lightheadedness with hypotension: No Has patient had a PCN reaction causing severe rash involving mucus membranes or skin necrosis: No Has patient had a PCN reaction that required hospitalization No Has patient had a PCN reaction occurring within the last 10 years: No If all of the above answers are "NO", then may proceed with Cephalosporin use.    Social History:   Social History   Socioeconomic History  . Marital status: Married    Spouse name: Not on file  . Number of children: Not on file  . Years of education: Not on file  . Highest education level: Not on file  Social Needs  . Financial resource strain: Not on file  . Food insecurity - worry: Not on file  . Food  insecurity - inability: Not on file  . Transportation needs - medical: Not on file  . Transportation needs - non-medical: Not on file  Occupational History  . Not on file  Tobacco Use  . Smoking status: Never Smoker  . Smokeless tobacco: Never Used  Substance and Sexual Activity  . Alcohol use: No  . Drug use: No  . Sexual activity: No  Other Topics Concern  . Not on file  Social History Narrative  . Not on file     Family History:    Family History  Problem Relation Age of Onset  . COPD Brother   . Atrial fibrillation Brother   . Diabetes Brother   . Liver cancer Brother        Alcohol-related  . Colon cancer Brother   . Heart attack Father   . Early death Father   . Rheum arthritis Mother   . Arthritis Mother   . Heart disease Mother   . Coronary artery disease Brother   . Heart attack Daughter   . Gallbladder disease Brother       Review of Systems    General:  No chills, fever, night sweats or weight changes.  Cardiovascular:  No chest pain, dyspnea on exertion, edema, orthopnea, paroxysmal nocturnal dyspnea. Positive for palpitations.  Dermatological: No rash, lesions/masses Respiratory: No cough, dyspnea Urologic: No hematuria, dysuria Abdominal:   No nausea, vomiting, diarrhea, bright red blood per rectum, melena, or hematemesis Neurologic:  No visual changes, wkns, changes in mental status. All other systems reviewed and are otherwise negative except as noted above.  Physical Exam/Data    Vitals:   01/28/17 0000 01/28/17 0442 01/28/17 0500 01/28/17 0543  BP: (!) 143/94 (!) 141/85  (!) 158/68  Pulse: 71 70  (!) 57  Resp: 14 16  16   Temp:    97.7 F (36.5 C)  TempSrc:    Oral  SpO2: 96% 98%  98%  Weight:   140 lb 6.9 oz (63.7 kg)   Height:   5\' 2"  (1.575 m)     Intake/Output Summary (Last 24 hours) at 01/28/2017 0809 Last data filed at 01/28/2017 0600 Gross per 24 hour  Intake 50 ml  Output -  Net 50 ml   Filed Weights   01/27/17 2343  01/28/17 0500  Weight: 138 lb (62.6 kg) 140 lb 6.9 oz (63.7 kg)   Body mass index is 25.69 kg/m.   General: Pleasant, Caucasian female appearing in NAD Psych: Normal affect. Neuro: Alert and oriented X 3. Moves all extremities spontaneously. HEENT: Normal  Neck: Supple without bruits or JVD. Lungs:  Resp regular and unlabored, CTA without wheezing or rales. Heart: RRR no s3, s4, or murmurs. Abdomen: Soft, non-tender, non-distended, BS + x 4.  Extremities: No clubbing, cyanosis or edema. DP/PT/Radials 2+ and equal bilaterally.   EKG:  The EKG was personally reviewed and demonstrates: Atrial fibrillation, HR 107.   Labs/Studies     Relevant CV Studies:  Cardiac Catheterization: 06/22/2016  Ost LAD lesion, 20 %stenosed.  Prox LAD lesion, 20 %stenosed.  Prox Cx to Mid Cx lesion, 20 %stenosed.  Mid RCA lesion, 10 %stenosed.  There is hyperdynamic left ventricular systolic function.  LV end diastolic pressure is normal.  The left ventricular ejection fraction is greater than 65% by visual estimate.   Hyperdynamic LV function with an ejection fraction greater than 65% without focal segmental abnormalities.  No significant coronary obstructive disease with mild coronary calcification involving the LAD with 20% proximal and mid narrowings, 20% smooth stenosis in the left circumflex vessel, and 10% luminal irregularity in the mid RCA.  RECOMMENDATION: Medical therapy.  Echocardiogram: 04/2016 Study Conclusions  - Left ventricle: The cavity size was normal. There was mild focal   basal hypertrophy of the septum. Systolic function was normal.   The estimated ejection fraction was in the range of 60% to 65%.   Wall motion was normal; there were no regional wall motion   abnormalities. Features are consistent with a pseudonormal left   ventricular filling pattern, with concomitant abnormal relaxation   and increased filling pressure (grade 2 diastolic dysfunction). -  Aortic valve: Trileaflet; mildly calcified leaflets. - Mitral valve: Calcified annulus. There was mild regurgitation. - Left atrium: The atrium was mildly dilated. - Right atrium: Central venous pressure (est): 3 mm Hg. - Atrial septum: No defect or patent foramen ovale was identified. - Tricuspid valve: There was mild regurgitation. - Pulmonary arteries: PA peak pressure: 34 mm Hg (S). - Pericardium, extracardiac: There was no pericardial effusion.  Impressions:  - Mild basal septal hypertrophy with LVEF 60-65% and grade 2   diastolic dysfunction. Mildly calcified mitral and aortic   annulus. Mild mitral regurgitation. Mild left atrial enlargement.   Mild tricuspid regurgitation with PASP 34 mmHg.  Laboratory Data:  Chemistry Recent Labs  Lab 01/28/17 0027 01/28/17 0447  NA 125* 128*  K 3.6 3.6  CL 89* 93*  CO2 24 25  GLUCOSE 106* 102*  BUN 9 8  CREATININE 0.59 0.51  CALCIUM 8.1* 8.2*  GFRNONAA >60 >60  GFRAA >60 >60  ANIONGAP 12 10    Recent Labs  Lab 01/28/17 0027  PROT 6.7  ALBUMIN 3.7  AST 20  ALT 16  ALKPHOS 56  BILITOT 0.5   Hematology Recent Labs  Lab 01/28/17 0027  WBC 6.4  RBC 3.89  HGB 12.0  HCT 35.8*  MCV 92.0  MCH 30.8  MCHC 33.5  RDW 13.9  PLT 334   Cardiac Enzymes Recent Labs  Lab 01/28/17 0027 01/28/17 0447  TROPONINI <0.03 <0.03   No results for input(s): TROPIPOC in the last 168 hours.  BNPNo results for input(s): BNP, PROBNP in the last  168 hours.  DDimer No results for input(s): DDIMER in the last 168 hours.  Radiology/Studies:  Dg Chest Port 1 View  Result Date: 01/28/2017 CLINICAL DATA:  Chest pain radiating to the back. Increased heart rate. All since 6 p.m. History of atrial fibrillation and hypertension. EXAM: PORTABLE CHEST 1 VIEW COMPARISON:  01/02/2017 FINDINGS: Shallow inspiration. Normal heart size and pulmonary vascularity. No focal airspace disease or consolidation in the lungs. No blunting of costophrenic  angles. No pneumothorax. Mediastinal contours appear intact. Calcification of the aorta. Degenerative changes in the right shoulder. Postoperative changes in the left shoulder. IMPRESSION: No active disease. Electronically Signed   By: Lucienne Capers M.D.   On: 01/28/2017 03:38     Assessment & Plan    1. Paroxysmal Atrial Fibrillation - the patient has a known history of this and has been on Flecainide for rhythm control. She presented with persistent palpitations lasting for over one day by her report with associated dyspnea.  - Initial labs show Na+ 125, K+ 3.6, creatinine 0.59, WBC 6.4, Hgb 12.0, platelets 334, INR 4.23. TSH at 2.963. Initial and delta troponin values have been negative.  - she spontaneously converted back to NSR overnight with HR in the 50's currently. Unable to further adjust Atenolol dosing due to her bradycardia when in NSR. She has been continued on Flecainide 50mg  BID despite prior cath showing minimal CAD as outlined above but no history of MI's or cardiomyopathy. If she is going to continue on this, can consider further dose titration or the use of pill-in-pocket when she develops tachypalpitations as this occurs only a few times per year by her report. - she is on Coumadin for anticoagulation but INR has been variable over the past several months due to the patient being confused on her dosage amount. Could consider switching to a NOAC as an outpatient if INR remains variable.  2. Chest Pain - initial notes mention she had chest pain but she denies any recent episodes of pain at this time. Describes mostly tachypalpitations that led to her admission.  - initial and delta troponin have been negative. With her recent cath just 6 months ago showing minimal CAD, would not pursue further ischemic evaluation at this time.  - continue BB and statin therapy. No ASA secondary to the need for anticoagulation.   3. HTN - BP has been slightly elevated at 141/68 - 158/94 since  admission. She reports this is well-controlled when checked at home. - continue PTA Atenolol 50mg  daily and Irbesartan 150mg  daily. Would not further titrate Atenolol with episodes of bradycardia.   4. Prolonged QTc - computer generated QTc on initial EKG at 601 ms. Corrected QTc at 580 ms. Repeat EKG is pending this AM.    5. Chronic Hyponatremia - Na+ 125 with a similar range over the past few months by review of recent labs. - per admitting team.    For questions or updates, please contact [facility] Please consult [email or web address] for contact info under Cardiology/STEMI.  Signed, Erma Heritage, PA-C 01/28/2017, 8:09 AM Pager: [phone]  Attending note Patient seen and discussed with PA Ahmed Prima, I agree with her documentation above. 84 yo female history of afib./aflutter (had been on flecanide but stopped due to afib), chest pain with prior abnormal nuclear stress test but cath 06/2016 without obstructive disease. Admitted with palpitations and chest pain.   Na 125 K 3.6 Cr 0.59 Hgb 12 WBC 6.4 Plt 334 INR 4.23 TSH 2.9 Mg 1.7 Trop neg x  2 CXR no acute process EKG afib elevated rate. Manual QTc 480  Presents primarily with palpiations while in afib. Episodes seem to be infrequent. In short term would recommend additional flecanide 50mg  prn palpitations, she needs to f/u with EP to discuss possible other antiarrhtyhmics since she has at least some mild CAD. Would not increase her av nodal agent due to baseline bradycardia when in sinus rhythm. Do not suspect ACS at this time. Manual QTc 480.   Ok for discharge, we will arrange outpatient f/u with Dr Lovena Le EP and Dr Percival Spanish primary cardiologist   Carlyle Dolly MD

## 2017-01-28 NOTE — Discharge Summary (Signed)
[name], is a 84 y.o. female  DOB 1932/08/30  MRN [id number].  Admission date:  01/27/2017  Admitting Physician  Vianne Bulls, MD  Discharge Date:  01/28/2017   Primary MD  Timmothy Euler, MD  Recommendations for primary care physician for things to follow:   Admission Diagnosis  Chest pain [R07.9]   Discharge Diagnosis  Chest pain [R07.9]    Principal Problem:   Chest pain Active Problems:   Paroxysmal atrial fibrillation (HCC)   Chronic diastolic CHF (congestive heart failure) (HCC)   HTN (hypertension)   Hyponatremia   Supratherapeutic INR   Prolonged QT interval      Past Medical History:  Diagnosis Date  . Anemia   . Atrial fibrillation (HCC)    Paroxysmal, Flecainide therapy  . Calf pain    September, 2012, at rest  . Cardiomegaly    EF normal, echo, 2003  //   EF 60-65%, echo, June 22, 9379, diastolic dysfunction,  . Carotid bruit    Doppler, December, 2009, no abnormality  . Cataract   . Diverticulosis   . GERD (gastroesophageal reflux disease)   . History of shingles   . HLD (hyperlipidemia)   . HTN (hypertension)   . Hypothyroidism   . Hypothyroidism   . Insomnia   . Normal nuclear stress test    Normal, 2001  . Osteoarthritis   . PONV (postoperative nausea and vomiting)   . Rectal bleeding 2001   diverticulosis and int hemorrhoids on 07/1999 and 02/2010 colonoscopies.  . Stroke ([place])   . Warfarin anticoagulation     Past Surgical History:  Procedure Laterality Date  . BIOPSY  09/29/2015   Procedure: BIOPSY;  Surgeon: Rogene Houston, MD;  [facility];  Service: Endoscopy;;  gastric  . CATARACT EXTRACTION W/PHACO Right 04/15/2014   Procedure: CATARACT EXTRACTION PHACO AND INTRAOCULAR LENS PLACEMENT (IOC);  Surgeon: Tonny Branch, MD;  Location: [facility];  Service: Ophthalmology;  Laterality: Right;  CDE: 13.30  . CATARACT EXTRACTION W/PHACO Left  05/13/2014   Procedure: CATARACT EXTRACTION PHACO AND INTRAOCULAR LENS PLACEMENT LEFT EYE;  Surgeon: Tonny Branch, MD;  Location: [facility];  Service: Ophthalmology;  Laterality: Left;  CDE:13.00  . CHOLECYSTECTOMY N/A 01/15/2015   Procedure: LAPAROSCOPIC CHOLECYSTECTOMY;  Surgeon: Ralene Ok, MD;  Location: [place];  Service: General;  Laterality: N/A;  . COLONOSCOPY N/A 09/29/2015   Procedure: COLONOSCOPY;  Surgeon: Rogene Houston, MD;  [facility];  Service: Endoscopy;  Laterality: N/A;  . COLONOSCOPY    . ESOPHAGOGASTRODUODENOSCOPY N/A 01/13/2015   Procedure: ESOPHAGOGASTRODUODENOSCOPY (EGD);  Surgeon: Jerene Bears, MD;  Location: [facility];  Service: Endoscopy;  Laterality: N/A;  . ESOPHAGOGASTRODUODENOSCOPY N/A 09/29/2015   Procedure: ESOPHAGOGASTRODUODENOSCOPY (EGD);  Surgeon: Rogene Houston, MD;  [facility];  Service: Endoscopy;  Laterality: N/A;  12:15  . GALLBLADDER SURGERY    . LEFT HEART CATH AND CORONARY ANGIOGRAPHY N/A 06/22/2016   Procedure: Left Heart Cath and Coronary Angiography;  Surgeon:  Troy Sine, MD;  Location: [place] [facility];  Service: Cardiovascular;  Laterality: N/A;  . SHOULDER OPEN ROTATOR CUFF REPAIR     bilateral  . TOTAL KNEE ARTHROPLASTY  05/26/10   right  . TOTAL KNEE ARTHROPLASTY  11/17/2010   Procedure: TOTAL KNEE ARTHROPLASTY;  Surgeon: Mauri Pole;  Location: [facility];  Service: Orthopedics;  Laterality: Left;  . TOTAL KNEE ARTHROPLASTY     Right  . TOTAL SHOULDER REPLACEMENT  01/2010   left     HPI  from the history and physical done on the day of admission:    Chief Complaint: Chest pain, palpitations   HPI: [name] is a 84 y.o. female with medical history significant for paroxysmal atrial fibrillation on warfarin, chronic diastolic CHF, and chronic hyponatremia, now presenting to the emergency department for evaluation of chest pain.  Patient reports that she had been in her usual state of health and was having  an uneventful day until approximately 6 PM when she developed acute onset of pain in the chest with radiation towards her shoulder.  There was some mild palpitations associated with this.  She was at rest at time of onset and no alleviating or exacerbating factors identified.  She took 5 baby aspirins prior to coming into the ED.  She denies any shortness of breath, cough, or recent fevers or chills.  Her husband at the bedside states that she has these episodes very frequently.  ED Course: Upon arrival to the ED, patient is found to be saturating well on room air, and with vitals otherwise normal.  EKG features atrial fibrillation with nonspecific IVCD LAD, and QTc interval of 601 ms.  Chest x-ray is unremarkable.  Chemistry panel reveals a sodium of 125, down from 132 last month.  CBC is unremarkable and INR is supratherapeutic at 4.23.  Troponin is undetectable.  Cardiology and [facility] was consulted by the ED physician and one medical admission for serial cardiac enzymes and consultation with cardiology in the morning was recommended.  Patient remained hemodynamically stable, in no apparent respiratory distress, and with observed on the telemetry unit for ongoing evaluation and management of chest pain.    Hospital Course:    1. Chest pain -rule out for ACS by cardiac enzymes and EKG, cardiology believes chest pain is from demand ischemia in the setting of A. fib with RVR, LHC in June 2018 with no significant obstructive CAD, continue beta-blockers, statin      2. Paroxysmal atrial fibrillation -admitted with A. fib with RVR back in sinus rhythm,  - CHADS-VASc is at least 44 (age x2, gender, CHF, HTN) ,  INR is 4.23 on admission  continue flecainide and atenolol  .  Patient would like to wait to discuss risks versus benefits of switching from Coumadin to Eliquis with PCP prior to making any changes.  May take additional dose of flecainide as needed palpitations.  She will follow-up with Dr. Lovena Le  to EP specialist postdischarge. Hold Coumadin on 01/28/2017 and 01/29/2017,  Restart Coumadin 5 mg on Sunday 01/30/2017, Recheck INR on Tuesday 02/01/17, Continue Flecainide 50 mg twice a day as advised, however occasionally if you get bad episodes of palpitations and dizziness you may take an additional tablet of Flecainide 50 mg x 1 only as suggested by Dr Harl Bowie (Cardiology)   3. Chronic diastolic CHF - - Appears to be well-compensated ,  Continue Lasix, beta-blocker, and ARB   Discharge Condition: stable  Follow UP Follow-up Information  Timmothy Euler, MD Follow up on 02/01/2017.   Specialty:  Family Medicine Why:  9:55 Contact information: [place] [address] [phone]        Evans Lance, MD Follow up on 02/18/2017.   Specialty:  Cardiology Why:  1:00 pm Contact information: [place] [address] [phone]            Consults obtained - cardiology  Diet and Activity recommendation:  As advised  Discharge Instructions    1)Hold Coumadin on 01/28/2017 and 01/29/2017,  Restart Coumadin 5 mg on Sunday 01/30/2017, Recheck INR on Tuesday 02/01/17  2)Continue Flecainide 50 mg twice a day as advised, however occasionally if you get bad episodes of palpitations and dizziness you may take an additional tablet of Flecainide 50 mg x 1 only as suggested by Dr Harl Bowie (Cardiology)  3)Talk to your Primary Physician about switching from Coumadin to Eliquis (Eliquis is a blood thinner that does not require regular blood test for  monitoring and that is not affected by your diet).  4)You will need to follow up with EP Cardiologist (Dr Lovena Le) in 2 to 3 weeks for Recheck    Discharge Instructions    ([place]) Call MD:  Anytime you have any of the following symptoms: 1) 3 pound weight gain in 24 hours or 5 pounds in 1 week 2) shortness of breath, with or without a dry hacking cough 3) swelling in the hands, feet or stomach 4) if you  have to sleep on extra pillows at night in order to breathe.   Complete by:  As directed    Call MD for:  difficulty breathing, headache or visual disturbances   Complete by:  As directed    Call MD for:  persistant dizziness or light-headedness   Complete by:  As directed    Call MD for:  persistant nausea and vomiting   Complete by:  As directed    Call MD for:  temperature >100.4   Complete by:  As directed    Diet - low sodium heart healthy   Complete by:  As directed    Discharge instructions   Complete by:  As directed    1)Hold Coumadin on 01/28/2017 and 01/29/2017,  Restart Coumadin 5 mg on Sunday 01/30/2017, Recheck INR on Tuesday 02/01/17  2)Continue Flecainide 50 mg twice a day as advised, however occasionally if you get bad episodes of palpitations and dizziness you may take an additional tablet of Flecainide 50 mg x 1 only as suggested by Dr Harl Bowie (Cardiology)  3)Talk to your Primary Physician about switching from Coumadin to Eliquis (Eliquis is a blood thinner that does not require regular blood test for  monitoring and that is not affected by your diet).  4)You will need to follow up with EP Cardiologist (Dr Lovena Le) in 2 to 3 weeks for Recheck   Increase activity slowly   Complete by:  As directed         Discharge Medications     Allergies as of 01/28/2017      Reactions   Penicillins Rash, Other (See Comments)   Has patient had a PCN reaction causing immediate rash, facial/tongue/throat swelling, SOB or lightheadedness with hypotension: No Has patient had a PCN reaction causing severe rash involving mucus membranes or skin necrosis: No Has patient had a PCN reaction that required hospitalization No Has patient had a PCN reaction occurring within the last 10 years: No If all  of the above answers are "NO", then may proceed with Cephalosporin use.      Medication List    TAKE these medications   aspirin 81 MG EC tablet Take 1 tablet (81 mg total) by mouth daily.  With Breakfast Start taking on:  01/29/2017   atenolol 50 MG tablet Commonly known as:  TENORMIN Take 1 tablet (50 mg total) by mouth daily. Take 1 tablet (50mg ) once daily Start taking on:  01/29/2017 What changed:    how much to take  when to take this  additional instructions   atorvastatin 20 MG tablet Commonly known as:  LIPITOR TAKE 1 TABLET BY MOUTH AT  BEDTIME   flecainide 50 MG tablet Commonly known as:  TAMBOCOR Take 1 tablet (50 mg total) by mouth 2 (two) times daily. What changed:    medication strength  how much to take  Another medication with the same name was removed. Continue taking this medication, and follow the directions you see here.   furosemide 40 MG tablet Commonly known as:  LASIX TAKE 1/2 TABLET BY MOUTH  EVERY MORNING , THEN TAKE 1 TABLET BY MOUTH IF SWELLING OCCURS   irbesartan 150 MG tablet Commonly known as:  AVAPRO Take 1 tablet (150 mg total) by mouth daily.   levETIRAcetam 250 MG tablet Commonly known as:  KEPPRA TAKE 1 TABLET  BY MOUTH 2  TIMES DAILY.   levothyroxine 50 MCG tablet Commonly known as:  SYNTHROID, LEVOTHROID Take 50 mcg by mouth daily before breakfast.   pantoprazole 40 MG tablet Commonly known as:  PROTONIX TAKE 1 TABLET DAILY   potassium chloride SA 20 MEQ tablet Commonly known as:  K-DUR,KLOR-CON Take 10 mEq by mouth 2 (two) times daily.   warfarin 5 MG tablet Commonly known as:  COUMADIN Take as directed. If you are unsure how to take this medication, talk to your nurse or doctor. Original instructions:  Take 1 tablet (5 mg total) by mouth daily. Start taking on:  01/30/2017 What changed:  These instructions start on 01/30/2017. If you are unsure what to do until then, ask your doctor or other care provider.       Major procedures and Radiology Reports - PLEASE review detailed and final reports for all details, in brief -   Dg Chest 2 View  Result Date: 01/02/2017 CLINICAL DATA:  Palpitations. EXAM:  CHEST  2 VIEW COMPARISON:  04/28/2016 FINDINGS: The heart size and mediastinal contours are within normal limits. Both lungs are clear. Previous left shoulder arthroplasty. IMPRESSION: No active cardiopulmonary disease. Electronically Signed   By: Kerby Moors M.D.   On: 01/02/2017 08:51   Dg Chest Port 1 View  Result Date: 01/28/2017 CLINICAL DATA:  Chest pain radiating to the back. Increased heart rate. All since 6 p.m. History of atrial fibrillation and hypertension. EXAM: PORTABLE CHEST 1 VIEW COMPARISON:  01/02/2017 FINDINGS: Shallow inspiration. Normal heart size and pulmonary vascularity. No focal airspace disease or consolidation in the lungs. No blunting of costophrenic angles. No pneumothorax. Mediastinal contours appear intact. Calcification of the aorta. Degenerative changes in the right shoulder. Postoperative changes in the left shoulder. IMPRESSION: No active disease. Electronically Signed   By: Lucienne Capers M.D.   On: 01/28/2017 03:38    Micro Results   No results found for this or any previous visit (from the past 240 hour(s)).     Today   Subjective    Kindal Ponti today has no new complaints, chest pain is resolved,  no shortness of breath, husband at bedside          Patient has been seen and examined prior to discharge   Objective   Blood pressure (!) 158/68, pulse (!) 59, temperature 97.7 F (36.5 C), temperature source Oral, resp. rate 16, height 5\' 2"  (1.575 m), weight 63.7 kg (140 lb 6.9 oz), SpO2 98 %.   Intake/Output Summary (Last 24 hours) at 01/28/2017 1434 Last data filed at 01/28/2017 0600 Gross per 24 hour  Intake 50 ml  Output -  Net 50 ml    Exam Gen:- Awake  In no apparent distress  HEENT:- [email or web address],   Neck-Supple Neck,No JVD,  Lungs- mostly clear  CV- S1, S2 normal Abd-  +ve B.Sounds, Abd Soft, No tenderness,    Extremity/Skin:- Intact peripheral pulses   Psych-affect is appropriate   Data Review   CBC w Diff:  Lab Results    Component Value Date   WBC 6.4 01/28/2017   HGB 12.0 01/28/2017   HGB 13.1 10/15/2016   HCT 35.8 (L) 01/28/2017   HCT 39.4 10/15/2016   PLT 334 01/28/2017   PLT 275 10/15/2016   LYMPHOPCT 25 01/28/2017   MONOPCT 15 01/28/2017   EOSPCT 2 01/28/2017   BASOPCT 1 01/28/2017    CMP:  Lab Results  Component Value Date   NA 128 (L) 01/28/2017   NA 136 10/15/2016   K 3.6 01/28/2017   CL 93 (L) 01/28/2017   CO2 25 01/28/2017   BUN 8 01/28/2017   BUN 9 10/15/2016   CREATININE 0.51 01/28/2017   PROT 6.7 01/28/2017   PROT 6.9 10/15/2016   ALBUMIN 3.7 01/28/2017   ALBUMIN 4.3 10/15/2016   BILITOT 0.5 01/28/2017   BILITOT 0.8 10/15/2016   ALKPHOS 56 01/28/2017   AST 20 01/28/2017   ALT 16 01/28/2017  .   Total Discharge time is about 33 minutes  Roxan Hockey M.D on 01/28/2017 at 2:34 PM  [facility]   Office  [phone]  Voice Recognition Viviann Spare dictation system was used to create this note, attempts have been made to correct errors. Please contact the author with questions and/or clarifications.

## 2017-01-28 NOTE — Progress Notes (Signed)
AVS reviewed with patient.  Verbalized understanding of discharge instructions, physician follow-up, medications.  Patient's IV removed.  Site WNL.  Patient transported by NT via wheelchair to main entrance at discharge.  Patient stable at time of discharge.

## 2017-01-28 NOTE — Discharge Instructions (Signed)
1)Hold Coumadin on 01/28/2017 and 01/29/2017,  Restart Coumadin 5 mg on Sunday 01/30/2017, Recheck INR on Tuesday 02/01/17  2)Continue Flecainide 50 mg twice a day as advised, however occasionally if you get bad episodes of palpitations and dizziness you may take an additional tablet of Flecainide 50 mg x 1 only as suggested by Dr Harl Bowie (Cardiology)  3)Talk to your Primary Physician about switching from Coumadin to Eliquis (Eliquis is a blood thinner that does not require regular blood test for  monitoring and that is not affected by your diet).  4)You will need to follow up with EP Cardiologist (Dr Lovena Le) in 2 to 3 weeks for Recheck

## 2017-01-28 NOTE — H&P (Signed)
History and Physical    [name] NWG:[id number] DOB: 22-Jan-1932 DOA: 01/27/2017  PCP: Timmothy Euler, MD   Patient coming from: Home  Chief Complaint: Chest pain, palpitations   HPI: [name] is a 84 y.o. female with medical history significant for paroxysmal atrial fibrillation on warfarin, chronic diastolic CHF, and chronic hyponatremia, now presenting to the emergency department for evaluation of chest pain.  Patient reports that she had been in her usual state of health and was having an uneventful day until approximately 6 PM when she developed acute onset of pain in the chest with radiation towards her shoulder.  There was some mild palpitations associated with this.  She was at rest at time of onset and no alleviating or exacerbating factors identified.  She took 5 baby aspirins prior to coming into the ED.  She denies any shortness of breath, cough, or recent fevers or chills.  Her husband at the bedside states that she has these episodes very frequently.  ED Course: Upon arrival to the ED, patient is found to be saturating well on room air, and with vitals otherwise normal.  EKG features atrial fibrillation with nonspecific IVCD LAD, and QTc interval of 601 ms.  Chest x-ray is unremarkable.  Chemistry panel reveals a sodium of 125, down from 132 last month.  CBC is unremarkable and INR is supratherapeutic at 4.23.  Troponin is undetectable.  Card[facility] and [facility] was consulted by the ED physician and one medical admission for serial cardiac enzymes and consultation with cardiology in the morning was recommended.  Patient remained hemodynamically stable, in no apparent respiratory distress, and with observed on the telemetry unit for ongoing evaluation and management of chest pain.  Review of Systems:  All other systems reviewed and apart from HPI, are negative.  Past Medical History:  Diagnosis Date  . Anemia   . Atrial fibrillation (HCC)    Paroxysmal, Flecainide  therapy  . Calf pain    September, 2012, at rest  . Cardiomegaly    EF normal, echo, 2003  //   EF 60-65%, echo, June 22, 5782, diastolic dysfunction,  . Carotid bruit    Doppler, December, 2009, no abnormality  . Cataract   . Diverticulosis   . GERD (gastroesophageal reflux disease)   . History of shingles   . HLD (hyperlipidemia)   . HTN (hypertension)   . Hypothyroidism   . Hypothyroidism   . Insomnia   . Normal nuclear stress test    Normal, 2001  . Osteoarthritis   . PONV (postoperative nausea and vomiting)   . Rectal bleeding 2001   diverticulosis and int hemorrhoids on 07/1999 and 02/2010 colonoscopies.  . Stroke ([place])   . Warfarin anticoagulation     Past Surgical History:  Procedure Laterality Date  . BIOPSY  09/29/2015   Procedure: BIOPSY;  Surgeon: Rogene Houston, MD;  [facility];  Service: Endoscopy;;  gastric  . CATARACT EXTRACTION W/PHACO Right 04/15/2014   Procedure: CATARACT EXTRACTION PHACO AND INTRAOCULAR LENS PLACEMENT (IOC);  Surgeon: Tonny Branch, MD;  Location: [facility] ORS;  Service: Ophthalmology;  Laterality: Right;  CDE: 13.30  . CATARACT EXTRACTION W/PHACO Left 05/13/2014   Procedure: CATARACT EXTRACTION PHACO AND INTRAOCULAR LENS PLACEMENT LEFT EYE;  Surgeon: Tonny Branch, MD;  [facility];  Service: Ophthalmology;  Laterality: Left;  CDE:13.00  . CHOLECYSTECTOMY N/A 01/15/2015   Procedure: LAPAROSCOPIC CHOLECYSTECTOMY;  Surgeon: Ralene Ok, MD;  Location: [place];  Service: General;  Laterality:  N/A;  . COLONOSCOPY N/A 09/29/2015   Procedure: COLONOSCOPY;  Surgeon: Rogene Houston, MD;  [facility];  Service: Endoscopy;  Laterality: N/A;  . COLONOSCOPY    . ESOPHAGOGASTRODUODENOSCOPY N/A 01/13/2015   Procedure: ESOPHAGOGASTRODUODENOSCOPY (EGD);  Surgeon: Jerene Bears, MD;  Location: [facility] ENDOSCOPY;  Service: Endoscopy;  Laterality: N/A;  . ESOPHAGOGASTRODUODENOSCOPY N/A 09/29/2015   Procedure: ESOPHAGOGASTRODUODENOSCOPY (EGD);   Surgeon: Rogene Houston, MD;  [facility];  Service: Endoscopy;  Laterality: N/A;  12:15  . GALLBLADDER SURGERY    . LEFT HEART CATH AND CORONARY ANGIOGRAPHY N/A 06/22/2016   Procedure: Left Heart Cath and Coronary Angiography;  Surgeon: Troy Sine, MD;  Location: [place] CV LAB;  Service: Cardiovascular;  Laterality: N/A;  . SHOULDER OPEN ROTATOR CUFF REPAIR     bilateral  . TOTAL KNEE ARTHROPLASTY  05/26/10   right  . TOTAL KNEE ARTHROPLASTY  11/17/2010   Procedure: TOTAL KNEE ARTHROPLASTY;  Surgeon: Mauri Pole;  [facility];  Service: Orthopedics;  Laterality: Left;  . TOTAL KNEE ARTHROPLASTY     Right  . TOTAL SHOULDER REPLACEMENT  01/2010   left     reports that  has never smoked. she has never used smokeless tobacco. She reports that she does not drink alcohol or use drugs.  Allergies  Allergen Reactions  . Penicillins Rash and Other (See Comments)    Has patient had a PCN reaction causing immediate rash, facial/tongue/throat swelling, SOB or lightheadedness with hypotension: No Has patient had a PCN reaction causing severe rash involving mucus membranes or skin necrosis: No Has patient had a PCN reaction that required hospitalization No Has patient had a PCN reaction occurring within the last 10 years: No If all of the above answers are "NO", then may proceed with Cephalosporin use.    Family History  Problem Relation Age of Onset  . COPD Brother   . Atrial fibrillation Brother   . Diabetes Brother   . Liver cancer Brother        Alcohol-related  . Colon cancer Brother   . Heart attack Father   . Early death Father   . Rheum arthritis Mother   . Arthritis Mother   . Heart disease Mother   . Coronary artery disease Brother   . Heart attack Daughter   . Gallbladder disease Brother      Prior to Admission medications   Medication Sig Start Date End Date Taking? Authorizing Provider  atenolol (TENORMIN) 50 MG tablet Take 50 mg by mouth  daily.    [provider]  atorvastatin (LIPITOR) 20 MG tablet TAKE 1 TABLET BY MOUTH AT  BEDTIME 10/08/16   Timmothy Euler, MD  dicyclomine (BENTYL) 10 MG capsule TAKE 1 CAPSULE BY MOUTH 4  TIMES DAILY BEFORE MEALS  AND AT BEDTIME Patient not taking: Reported on 01/18/2017 12/20/16   Timmothy Euler, MD  flecainide (TAMBOCOR) 100 MG tablet TAKE 1 TABLET BY MOUTH TWO  TIMES DAILY Patient taking differently: take 1/2 tablet twice daily 11/16/16   Timmothy Euler, MD  flecainide (TAMBOCOR) 100 MG tablet TAKE 1 TABLET BY MOUTH TWO  TIMES DAILY 01/24/17   Timmothy Euler, MD  furosemide (LASIX) 40 MG tablet TAKE 1/2 TABLET BY MOUTH  EVERY MORNING , THEN TAKE 1 TABLET BY MOUTH IF SWELLING OCCURS 11/23/16   Timmothy Euler, MD  irbesartan (AVAPRO) 150 MG tablet Take 1 tablet (150 mg total) by mouth daily. 10/15/16  Timmothy Euler, MD  levETIRAcetam (KEPPRA) 250 MG tablet TAKE 1 TABLET  BY MOUTH 2  TIMES DAILY. 01/24/17   Timmothy Euler, MD  levothyroxine (SYNTHROID, LEVOTHROID) 50 MCG tablet Take 50 mcg by mouth daily before breakfast.  06/03/16   [provider]  pantoprazole (PROTONIX) 40 MG tablet TAKE 1 TABLET DAILY 01/11/17   Timmothy Euler, MD  potassium chloride SA (K-DUR,KLOR-CON) 20 MEQ tablet Take 20 mEq by mouth daily.    [provider]  traZODone (DESYREL) 50 MG tablet Take 1 tablet (50 mg total) by mouth at bedtime as needed for sleep. 01/18/17   Timmothy Euler, MD  warfarin (COUMADIN) 5 MG tablet Take 5 mg by mouth daily.    [provider]    Physical Exam: Vitals:   01/27/17 2343 01/28/17 0000  BP:  (!) 143/94  Pulse:  71  Resp:  14  SpO2:  96%  Weight: 62.6 kg (138 lb)   Height: 5\' 2"  (1.575 m)       Constitutional: NAD, calm, comfortable Eyes: PERTLA, lids and conjunctivae normal ENMT: Mucous membranes are moist. Posterior pharynx clear of any exudate or lesions.   Neck: normal, supple, no masses, no  thyromegaly Respiratory: clear to auscultation bilaterally, no wheezing, no crackles. Normal respiratory effort.   Cardiovascular: S1 & S2 heard, regular rate and rhythm. No significant JVD. Abdomen: No distension, no tenderness, no masses palpated. Bowel sounds normal.  Musculoskeletal: no clubbing / cyanosis. No joint deformity upper and lower extremities.   Skin: no significant rashes, lesions, ulcers. Warm, dry, well-perfused. Neurologic: CN 2-12 grossly intact. Sensation intact. Strength 5/5 in all 4 limbs.  Psychiatric: Alert and oriented x 3. Pleasant and cooperative.     Labs on Admission: I have personally reviewed following labs and imaging studies  CBC: Recent Labs  Lab 01/28/17 0027  WBC 6.4  NEUTROABS 3.7  HGB 12.0  HCT 35.8*  MCV 92.0  PLT 010   Basic Metabolic Panel: Recent Labs  Lab 01/28/17 0027  NA 125*  K 3.6  CL 89*  CO2 24  GLUCOSE 106*  BUN 9  CREATININE 0.59  CALCIUM 8.1*   GFR: Estimated Creatinine Clearance: 47.2 mL/min (by C-G formula based on SCr of 0.59 mg/dL). Liver Function Tests: Recent Labs  Lab 01/28/17 0027  AST 20  ALT 16  ALKPHOS 56  BILITOT 0.5  PROT 6.7  ALBUMIN 3.7   No results for input(s): LIPASE, AMYLASE in the last 168 hours. No results for input(s): AMMONIA in the last 168 hours. Coagulation Profile: Recent Labs  Lab 01/28/17 0027  INR 4.23*   Cardiac Enzymes: Recent Labs  Lab 01/28/17 0027  TROPONINI <0.03   BNP (last 3 results) No results for input(s): PROBNP in the last 8760 hours. HbA1C: No results for input(s): HGBA1C in the last 72 hours. CBG: No results for input(s): GLUCAP in the last 168 hours. Lipid Profile: No results for input(s): CHOL, HDL, LDLCALC, TRIG, CHOLHDL, LDLDIRECT in the last 72 hours. Thyroid Function Tests: No results for input(s): TSH, T4TOTAL, FREET4, T3FREE, THYROIDAB in the last 72 hours. Anemia Panel: No results for input(s): VITAMINB12, FOLATE, FERRITIN, TIBC, IRON,  RETICCTPCT in the last 72 hours. Urine analysis:    Component Value Date/Time   COLORURINE COLORLESS (A) 11/13/2016 0124   APPEARANCEUR CLEAR 11/13/2016 0124   APPEARANCEUR Clear 11/01/2016 1550   LABSPEC 1.004 (L) 11/13/2016 0124   PHURINE 7.0 11/13/2016 0124   GLUCOSEU NEGATIVE 11/13/2016 0124  HGBUR SMALL (A) 11/13/2016 0124   BILIRUBINUR NEGATIVE 11/13/2016 0124   BILIRUBINUR Negative 11/01/2016 1550   KETONESUR NEGATIVE 11/13/2016 0124   PROTEINUR NEGATIVE 11/13/2016 0124   UROBILINOGEN 0.2 11/09/2010 1235   NITRITE NEGATIVE 11/13/2016 0124   LEUKOCYTESUR NEGATIVE 11/13/2016 0124   LEUKOCYTESUR Negative 11/01/2016 1550   Sepsis Labs: @LABRCNTIP (procalcitonin:4,lacticidven:4) )No results found for this or any previous visit (from the past 240 hour(s)).   Radiological Exams on Admission: No results found.  EKG: Independently reviewed. Atrial fibrillation   Assessment/Plan  1. Chest pain  - Presents with chest pain and palpitations  - EKG with rate-controlled a fib, CXR unremarkable, initial troponin undetectable  - Cardiology recommended medical admission with serial cardiac enzyme measurements  - She took 5 baby aspirins prior to arrival  - Continue cardiac monitoring, obtain serial troponin measurements, repeat EKG, continue statin, beta-blocker, and ARB; she is anticoagulated with warfarin    2. Paroxysmal atrial fibrillation  - In rate-controlled a fib on admission  - CHADS-VASc is at least 7 (age x2, gender, CHF, HTN)  - INR is 4.23 on admission  - Hold warfarin, repeat INR in am, continue flecainide and atenolol    3. Chronic diastolic CHF  - Appears to be well-compensated  - Continue Lasix, beta-blocker, and ARB   4. Hyponatremia  - Serum sodium is 125 on admission  - This is chronic with sodium in 127-132 range over past cpl mos  - Appears roughly euvolemic on admission   5. Prolonged QTc  - QTc is prolonged to 601 ms on admission  - Plan to continue  cardiac monitoring, replace potassium to 4 and magnesium to 2, avoid offending medications, repeat EKG in am    DVT prophylaxis: warfarin  Code Status: Full  Family Communication: Discussed with patient Disposition Plan: Observe on telemetry Consults called: Cardiology Admission status: Observation    Vianne Bulls, MD [facility] Pager [phone]  If 7PM-7AM, please contact night-coverage [email or web address] Password [facility]  01/28/2017, 3:04 AM

## 2017-01-28 NOTE — Care Management Obs Status (Signed)
[place] NOTIFICATION   Patient Details  Name: [name] MRN: [id number] Date of Birth: 14-Jul-1932   Medicare Observation Status Notification Given:  Yes    Sherald Barge, RN 01/28/2017, 9:25 AM

## 2017-01-28 NOTE — ED Notes (Signed)
Date and time results received: 01/28/17 0134 (use smartphrase ".now" to insert current time)  Test:  INR Critical Value: 4.23  Name of Provider Notified: [place]  Orders Received? Or Actions Taken?:

## 2017-01-28 NOTE — ED Provider Notes (Signed)
[facility] EMERGENCY DEPARTMENT Provider Note   CSN: [id number] Arrival date & time: 01/27/17  2324     History   Chief Complaint Chief Complaint  Patient presents with  . Chest Pain    HPI [name] is a 84 y.o. female.  Patient is an 84 year old female with past medical history of paroxysmal atrial fibrillation, hypertension, anemia presenting for evaluation of palpitations.  She states that approximately 5 PM this afternoon she felt fluttering in her chest which has persisted through the evening.  She reports some pressure between her shoulder blades but denies any difficulty breathing, nausea, diaphoresis, or radiation to the arm or jaw.  She denies any recent illness.  She denies doing anything strenuous, however does report increased emotional stress recent   The history is provided by the patient.  Chest Pain   This is a recurrent problem. Episode onset: 5 PM. The problem occurs constantly. The problem has not changed since onset.Pain location: Between shoulder blades. The pain is moderate. The pain does not radiate. Pertinent negatives include no fever and no shortness of breath. She has tried nothing for the symptoms.    Past Medical History:  Diagnosis Date  . Anemia   . Atrial fibrillation (HCC)    Paroxysmal, Flecainide therapy  . Calf pain    September, 2012, at rest  . Cardiomegaly    EF normal, echo, 2003  //   EF 60-65%, echo, June 22, 1091, diastolic dysfunction,  . Carotid bruit    Doppler, December, 2009, no abnormality  . Cataract   . Diverticulosis   . GERD (gastroesophageal reflux disease)   . History of shingles   . HLD (hyperlipidemia)   . HTN (hypertension)   . Hypothyroidism   . Hypothyroidism   . Insomnia   . Normal nuclear stress test    Normal, 2001  . Osteoarthritis   . PONV (postoperative nausea and vomiting)   . Rectal bleeding 2001   diverticulosis and int hemorrhoids on 07/1999 and 02/2010 colonoscopies.  . Stroke ([place])   .  Warfarin anticoagulation     Patient Active Problem List   Diagnosis Date Noted  . Abnormal nuclear cardiac imaging test   . Unstable angina ([place]) 06/16/2016  . Atrial flutter ([place]) 06/16/2016  . Abnormal stress test 06/16/2016  . Frequent urination 05/10/2016  . Acute cystitis with hematuria 05/10/2016  . Chest pain 04/23/2016  . Hypomagnesemia 04/23/2016  . AP (abdominal pain)   . Melena 09/23/2015  . Overactive bladder 05/02/2015  . Acute cerebrovascular accident (CVA) ([place]) 04/21/2015  . Acute cardioembolic stroke ([place]) 23/55/7322  . Healthcare maintenance 02/20/2015  . History of dysarthria 02/20/2015  . Enteritis due to Clostridium difficile 01/27/2015  . HTN (hypertension) 01/11/2015  . Hyponatremia 01/11/2015  . Rectal bleeding 01/11/2015  . Chronic pain syndrome 11/25/2014  . Insomnia 11/25/2014  . Chronic ulcer of toe ([place]) 11/13/2014  . Diastolic dysfunction 02/54/2706  . Paroxysmal atrial fibrillation ([place]) 10/14/2014  . Warfarin anticoagulation   . Hypothyroidism   . Carotid bruit   . Normal nuclear stress test   . S/P knee replacement   . UNSPECIFIED ANEMIA 01/19/2010  . DIVERTICULOSIS-COLON 01/19/2010    Past Surgical History:  Procedure Laterality Date  . BIOPSY  09/29/2015   Procedure: BIOPSY;  Surgeon: Rogene Houston, MD;  [facility];  Service: Endoscopy;;  gastric  . CATARACT EXTRACTION W/PHACO Right 04/15/2014   Procedure: CATARACT EXTRACTION PHACO AND INTRAOCULAR LENS PLACEMENT (IOC);  Surgeon: Tonny Branch, MD;  Location: [facility];  Service: Ophthalmology;  Laterality: Right;  CDE: 13.30  . CATARACT EXTRACTION W/PHACO Left 05/13/2014   Procedure: CATARACT EXTRACTION PHACO AND INTRAOCULAR LENS PLACEMENT LEFT EYE;  Surgeon: Tonny Branch, MD;  [facility];  Service: Ophthalmology;  Laterality: Left;  CDE:13.00  . CHOLECYSTECTOMY N/A 01/15/2015   Procedure: LAPAROSCOPIC CHOLECYSTECTOMY;  Surgeon: Ralene Ok, MD;  Location: [place];  Service:  General;  Laterality: N/A;  . COLONOSCOPY N/A 09/29/2015   Procedure: COLONOSCOPY;  Surgeon: Rogene Houston, MD;  [facility];  Service: Endoscopy;  Laterality: N/A;  . COLONOSCOPY    . ESOPHAGOGASTRODUODENOSCOPY N/A 01/13/2015   Procedure: ESOPHAGOGASTRODUODENOSCOPY (EGD);  Surgeon: Jerene Bears, MD;  Location: [facility] ENDOSCOPY;  Service: Endoscopy;  Laterality: N/A;  . ESOPHAGOGASTRODUODENOSCOPY N/A 09/29/2015   Procedure: ESOPHAGOGASTRODUODENOSCOPY (EGD);  Surgeon: Rogene Houston, MD;  [facility];  Service: Endoscopy;  Laterality: N/A;  12:15  . GALLBLADDER SURGERY    . LEFT HEART CATH AND CORONARY ANGIOGRAPHY N/A 06/22/2016   Procedure: Left Heart Cath and Coronary Angiography;  Surgeon: Troy Sine, MD;  Location: [place] CV LAB;  Service: Cardiovascular;  Laterality: N/A;  . SHOULDER OPEN ROTATOR CUFF REPAIR     bilateral  . TOTAL KNEE ARTHROPLASTY  05/26/10   right  . TOTAL KNEE ARTHROPLASTY  11/17/2010   Procedure: TOTAL KNEE ARTHROPLASTY;  Surgeon: Mauri Pole;  [facility];  Service: Orthopedics;  Laterality: Left;  . TOTAL KNEE ARTHROPLASTY     Right  . TOTAL SHOULDER REPLACEMENT  01/2010   left    OB History    Gravida Para Term Preterm AB Living   1 1 1          SAB TAB Ectopic Multiple Live Births                   Home Medications    Prior to Admission medications   Medication Sig Start Date End Date Taking? Authorizing Provider  atenolol (TENORMIN) 50 MG tablet Take 50 mg by mouth daily.    [provider]  atorvastatin (LIPITOR) 20 MG tablet TAKE 1 TABLET BY MOUTH AT  BEDTIME 10/08/16   Timmothy Euler, MD  dicyclomine (BENTYL) 10 MG capsule TAKE 1 CAPSULE BY MOUTH 4  TIMES DAILY BEFORE MEALS  AND AT BEDTIME Patient not taking: Reported on 01/18/2017 12/20/16   Timmothy Euler, MD  flecainide (TAMBOCOR) 100 MG tablet TAKE 1 TABLET BY MOUTH TWO  TIMES DAILY Patient taking differently: take 1/2 tablet twice daily  11/16/16   Timmothy Euler, MD  flecainide (TAMBOCOR) 100 MG tablet TAKE 1 TABLET BY MOUTH TWO  TIMES DAILY 01/24/17   Timmothy Euler, MD  furosemide (LASIX) 40 MG tablet TAKE 1/2 TABLET BY MOUTH  EVERY MORNING , THEN TAKE 1 TABLET BY MOUTH IF SWELLING OCCURS 11/23/16   Timmothy Euler, MD  irbesartan (AVAPRO) 150 MG tablet Take 1 tablet (150 mg total) by mouth daily. 10/15/16   Timmothy Euler, MD  levETIRAcetam (KEPPRA) 250 MG tablet TAKE 1 TABLET  BY MOUTH 2  TIMES DAILY. 01/24/17   Timmothy Euler, MD  levothyroxine (SYNTHROID, LEVOTHROID) 50 MCG tablet Take 50 mcg by mouth daily before breakfast.  06/03/16   [provider]  pantoprazole (PROTONIX) 40 MG tablet TAKE 1 TABLET DAILY 01/11/17   Timmothy Euler, MD  potassium chloride SA (K-DUR,KLOR-CON) 20 MEQ tablet Take 20  mEq by mouth daily.    [provider]  traZODone (DESYREL) 50 MG tablet Take 1 tablet (50 mg total) by mouth at bedtime as needed for sleep. 01/18/17   Timmothy Euler, MD  warfarin (COUMADIN) 5 MG tablet Take 5 mg by mouth daily.    [provider]    Family History Family History  Problem Relation Age of Onset  . COPD Brother   . Atrial fibrillation Brother   . Diabetes Brother   . Liver cancer Brother        Alcohol-related  . Colon cancer Brother   . Heart attack Father   . Early death Father   . Rheum arthritis Mother   . Arthritis Mother   . Heart disease Mother   . Coronary artery disease Brother   . Heart attack Daughter   . Gallbladder disease Brother     Social History Social History   Tobacco Use  . Smoking status: Never Smoker  . Smokeless tobacco: Never Used  Substance Use Topics  . Alcohol use: No  . Drug use: No     Allergies   Penicillins   Review of Systems Review of Systems  Constitutional: Negative for fever.  Respiratory: Negative for shortness of breath.   Cardiovascular: Positive for chest pain.  All other systems reviewed and  are negative.    Physical Exam Updated Vital Signs Ht 5\' 2"  (1.575 m)   Wt 62.6 kg (138 lb)   BMI 25.24 kg/m   Physical Exam  Constitutional: She is oriented to person, place, and time. She appears well-developed and well-nourished. No distress.  HENT:  Head: Normocephalic and atraumatic.  Neck: Normal range of motion. Neck supple.  Cardiovascular: An irregularly irregular rhythm present. Tachycardia present. Exam reveals no gallop and no friction rub.  No murmur heard. Pulmonary/Chest: Effort normal and breath sounds normal. No respiratory distress. She has no wheezes.  Abdominal: Soft. Bowel sounds are normal. She exhibits no distension. There is no tenderness.  Musculoskeletal: Normal range of motion.       Right lower leg: She exhibits no edema.       Left lower leg: She exhibits no edema.  Neurological: She is alert and oriented to person, place, and time.  Skin: Skin is warm and dry. She is not diaphoretic.  Nursing note and vitals reviewed.    ED Treatments / Results  Labs (all labs ordered are listed, but only abnormal results are displayed) Labs Reviewed  COMPREHENSIVE METABOLIC PANEL  CBC WITH DIFFERENTIAL/PLATELET  PROTIME-INR  TROPONIN I    EKG  EKG Interpretation  Date/Time:  Thursday January 27 2017 23:43:38 EST Ventricular Rate:  107 PR Interval:    QRS Duration: 143 QT Interval:  427 QTC Calculation: 601 R Axis:   -62 Text Interpretation:  Atrial fibrillation Nonspecific IVCD with LAD Left ventricular hypertrophy Anterior Q waves, possibly due to LVH Confirmed by [name] [phone]) on 01/28/2017 12:27:02 AM       Radiology No results found.  Procedures Procedures (including critical care time)  Medications Ordered in ED Medications - No data to display   Initial Impression / Assessment and Plan / ED Course  I have reviewed the triage vital signs and the nursing notes.  Pertinent labs & imaging results that were available during my  care of the patient were reviewed by me and considered in my medical decision making (see chart for details).  Patient is an 84 year old female presenting with complaints of palpitations.  She has a history of paroxysmal atrial fibrillation and it appears as though she is back in this rhythm.  She does report some discomfort between her shoulder blades, however her first troponin is negative.  Remainder of her workup thus far is essentially unremarkable.  She seems to have good rate control so no further medications were administered.  I discussed the care with Dr. Radford Pax from cardiology who is recommending admission for rule out of MI and possible cardioversion in the morning.  I have spoken with Dr. Myna Hidalgo who agrees to admit.  Final Clinical Impressions(s) / ED Diagnoses   Final diagnoses:  None    ED Discharge Orders    None       [name], MD 01/28/17 [phone]

## 2017-01-31 ENCOUNTER — Encounter (INDEPENDENT_AMBULATORY_CARE_PROVIDER_SITE_OTHER): Payer: Self-pay | Admitting: Internal Medicine

## 2017-01-31 ENCOUNTER — Ambulatory Visit (INDEPENDENT_AMBULATORY_CARE_PROVIDER_SITE_OTHER): Payer: Medicare Other | Admitting: Internal Medicine

## 2017-01-31 VITALS — BP 120/86 | HR 60 | Temp 97.7°F | Resp 18 | Ht 62.0 in | Wt 137.8 lb

## 2017-01-31 DIAGNOSIS — K449 Diaphragmatic hernia without obstruction or gangrene: Secondary | ICD-10-CM

## 2017-01-31 DIAGNOSIS — K921 Melena: Secondary | ICD-10-CM | POA: Diagnosis not present

## 2017-01-31 DIAGNOSIS — K219 Gastro-esophageal reflux disease without esophagitis: Secondary | ICD-10-CM | POA: Diagnosis not present

## 2017-01-31 NOTE — Progress Notes (Signed)
Presenting complaint;  Follow-up for GERD and diarrhea.  Subjective:  Patient is 84 year old Caucasian female who is here for scheduled visit.  She was last seen on 11/03/2016 for diarrhea.  Her symptoms are felt to be due to IBS.  She was advised to try IBgard.  She took samples for a few days.  She is not taking it anymore.  She says she is doing much better as far as her diarrhea is concerned.  She may have diarrhea 1 day a week when she has 2 stools.  They are loose and small volume.  Rest of the days she has formed stools.  She does not feel constipated.  She notices blood on the tissue once or twice a month.  She noted some this morning.  She has not experience frank bleeding and she also denies melena.  She says her appetite is good.  Her weight is down by 2 pounds.  She states she was in the emergency room 3 days ago for rapid heart rate.  She has atrial fibrillation.  She states GERD symptoms have responded well to pantoprazole.  She may have 1 or 2 episodes of heartburn a week and these episodes are mild and she does not have to take any medications.  She denies dysphagia nausea vomiting or abdominal pain.  She also complains of she left eye tears.  She has an appointment to see her eye doctor. Patient states that she was told by the ER physician to consider Eliquis.  She is doubtful with that she would be able to afford this medication.  She says the last time she checked co-pay was too high.   Current Medications: Outpatient Encounter Medications as of 01/31/2017  Medication Sig  . aspirin EC 81 MG EC tablet Take 1 tablet (81 mg total) by mouth daily. With Breakfast  . atenolol (TENORMIN) 50 MG tablet Take 1 tablet (50 mg total) by mouth daily. Take 1 tablet (50mg ) once daily  . atorvastatin (LIPITOR) 20 MG tablet TAKE 1 TABLET BY MOUTH AT  BEDTIME  . flecainide (TAMBOCOR) 50 MG tablet Take 1 tablet (50 mg total) by mouth 2 (two) times daily.  . furosemide (LASIX) 40 MG tablet TAKE 1/2  TABLET BY MOUTH  EVERY MORNING , THEN TAKE 1 TABLET BY MOUTH IF SWELLING OCCURS  . irbesartan (AVAPRO) 150 MG tablet Take 1 tablet (150 mg total) by mouth daily.  Marland Kitchen levETIRAcetam (KEPPRA) 250 MG tablet TAKE 1 TABLET  BY MOUTH 2  TIMES DAILY.  Marland Kitchen levothyroxine (SYNTHROID, LEVOTHROID) 50 MCG tablet Take 50 mcg by mouth daily before breakfast.   . pantoprazole (PROTONIX) 40 MG tablet TAKE 1 TABLET DAILY  . potassium chloride SA (K-DUR,KLOR-CON) 20 MEQ tablet Take 10 mEq by mouth 2 (two) times daily.   Marland Kitchen warfarin (COUMADIN) 5 MG tablet Take 1 tablet (5 mg total) by mouth daily.   No facility-administered encounter medications on file as of 01/31/2017.      Objective: Blood pressure 120/86, pulse 60, temperature 97.7 F (36.5 C), temperature source Oral, resp. rate 18, height 5\' 2"  (1.575 m), weight 137 lb 12.8 oz (62.5 kg). Patient is alert and in no acute distress. Conjunctiva is pink. Sclera is nonicteric Oropharyngeal mucosa is normal. No neck masses or thyromegaly noted. Cardiac exam with regular rhythm normal S1 and S2. No murmur or gallop noted. Lungs are clear to auscultation. Abdomen is symmetrical soft and nontender with organomegaly or masses. No LE edema or clubbing noted.  Labs/studies Results:  Lab data from 01/28/2017 WBC 6.4, H&H 12 and 35.8 and platelet count 334K.  BUN 9 creatinine 0.59 Serum calcium 8.1. Serum sodium 125 serum potassium 3.6, chloride 89, CO2 24 Glucose 106. Bilirubin 0.5, AP 56, AST 20 and ALT 16. Total protein 6.7 and albumin 3.7.  Repeat serum sodium was 128.  Please note that these labs are done when she was in the emergency room 3 days ago.   Assessment:  #1.  Chronic GERD.  Last EGD was in September 2017 revealing 4 cm size hiatal hernia and erosive gastritis.  Biopsy was negative for H. pylori infection and showed changes of intestinal metaplasia.  She has a history of GI bleed/melena.  Since she is doing so well will try her on every other  day PPI and see how she does.  If she does well prescription will be changed in future.  #2.  Intermittent diarrhea possibly due to IBS and/or medications.  She is having normal bowel movements on most days.  She does not need any specific intervention at this time.  #3.  Intermittent hematochezia.  Recent H&H was normal.  Colonoscopy in September 2017 revealed internal hemorrhoids.  No therapy indicated at this time.  #4.  Hyponatremia recently documented on ER visit most likely due to diuretic therapy.  This will be followed by her PCP Dr. Kenn File.   Plan:  Patient will try pantoprazole 40 mg every other day.  If she starts to have breakthrough symptoms on off days she will go back to daily schedule. Patient will call with progress report in 3 months. Patient will call office if she has melena or frank rectal bleeding. Office visit in 1 year.

## 2017-01-31 NOTE — Patient Instructions (Signed)
Can try pantoprazole every other day.  If every other day does not control GERD symptoms can go back to daily dose.  Please call with progress report in 3 months. Notify if you have frank rectal bleeding or melena.

## 2017-02-01 ENCOUNTER — Ambulatory Visit (INDEPENDENT_AMBULATORY_CARE_PROVIDER_SITE_OTHER): Payer: Medicare Other | Admitting: Family Medicine

## 2017-02-01 ENCOUNTER — Ambulatory Visit (INDEPENDENT_AMBULATORY_CARE_PROVIDER_SITE_OTHER): Payer: Medicare Other

## 2017-02-01 ENCOUNTER — Encounter: Payer: Self-pay | Admitting: Family Medicine

## 2017-02-01 VITALS — BP 112/64 | HR 62 | Temp 97.5°F | Ht 62.0 in | Wt 140.0 lb

## 2017-02-01 DIAGNOSIS — Z780 Asymptomatic menopausal state: Secondary | ICD-10-CM

## 2017-02-01 DIAGNOSIS — M810 Age-related osteoporosis without current pathological fracture: Secondary | ICD-10-CM | POA: Diagnosis not present

## 2017-02-01 DIAGNOSIS — E871 Hypo-osmolality and hyponatremia: Secondary | ICD-10-CM

## 2017-02-01 DIAGNOSIS — I480 Paroxysmal atrial fibrillation: Secondary | ICD-10-CM

## 2017-02-01 LAB — COAGUCHEK XS/INR WAIVED
INR: 2.3 — ABNORMAL HIGH (ref 0.9–1.1)
Prothrombin Time: 27.9 s

## 2017-02-01 MED ORDER — APIXABAN 5 MG PO TABS: 5.0000 mg | ORAL_TABLET | Freq: Two times a day (BID) | ORAL | 11 refills | Status: DC

## 2017-02-01 NOTE — Progress Notes (Signed)
   HPI  Patient presents today here for hospital follow-up.  Patient was admitted to the hospital with chest pain and found to have atrial fibrillation with rapid ventricular response.   Patient states that today she feels a bit unsteady has mild dizziness, this started this morning. She denies any changes in medication and states that otherwise she is tolerating food and fluids well and feels well.  She denies any additional chest pain or palpitations.  She is drinking 2-3 bottles of water or Gatorade daily.  PMH: Smoking status noted ROS: Per HPI  Objective: BP 112/64   Pulse 62   Temp (!) 97.5 F (36.4 C) (Oral)   Ht '5\' 2"'$  (1.575 m)   Wt 140 lb (63.5 kg)   BMI 25.61 kg/m  Gen: NAD, alert, cooperative with exam HEENT: NCAT CV: RRR, good S1/S2, no murmur Resp: CTABL, no wheezes, non-labored Ext: Trace pitting edema bilateral lower extremities Neuro: Alert and oriented, No gross deficits  Assessment and plan:  #Paroxysmal atrial fibrillation Patient with recent admission for A. fib with RVR, now rate controlled She is on beta-blocker plus flecainide She has a as needed dose of flecainide if needed. INR pending, if patient can afford it she is agreeable to change to Eliquis.  5 mg twice daily given normal renal function and weight over 60 kg   #Hyponatremia Unclear etiology, discussed with patient Repeat labs Asymptomatic, discharged with sodium 128 Consider possible etiology being positive fluid balance, SIADH,  #Postmenopausal-DEXA scan today   Orders Placed This Encounter  Procedures  . DG [facility] DEXA    Order Specific Question:   Reason for Exam (SYMPTOM  OR DIAGNOSIS REQUIRED)    Answer:   post menopausa  . BMP8+EGFR  . LDL Cholesterol, Direct  . CoaguChek XS/INR Waived    Meds ordered this encounter  Medications  . apixaban (ELIQUIS) 5 MG TABS tablet    Sig: Take 1 tablet (5 mg total) by mouth 2 (two) times daily.    Dispense:  60 tablet   Refill:  [place], MD [facility] 02/01/2017, 9:46 AM

## 2017-02-02 LAB — LDL CHOLESTEROL, DIRECT: LDL Direct: 67 mg/dL (ref 0–99)

## 2017-02-02 LAB — BMP8+EGFR
BUN/Creatinine Ratio: 13 (ref 12–28)
BUN: 11 mg/dL (ref 8–27)
CO2: 25 mmol/L (ref 20–29)
Calcium: 9.1 mg/dL (ref 8.7–10.3)
Chloride: 92 mmol/L — ABNORMAL LOW (ref 96–106)
Creatinine, Ser: 0.82 mg/dL (ref 0.57–1.00)
GFR calc non Af Amer: 67 mL/min/{1.73_m2} (ref >59)
GFR, EST AFRICAN AMERICAN: 77 mL/min/{1.73_m2} (ref >59)
Glucose: 85 mg/dL (ref 65–99)
POTASSIUM: 4.7 mmol/L (ref 3.5–5.2)
SODIUM: 133 mmol/L — AB (ref 134–144)

## 2017-02-03 ENCOUNTER — Other Ambulatory Visit: Payer: Self-pay | Admitting: *Deleted

## 2017-02-03 MED ORDER — APIXABAN 5 MG PO TABS: 5.0000 mg | ORAL_TABLET | Freq: Two times a day (BID) | ORAL | 3 refills | Status: DC

## 2017-02-11 ENCOUNTER — Encounter: Payer: Self-pay | Admitting: Internal Medicine

## 2017-02-15 ENCOUNTER — Encounter: Payer: Self-pay | Admitting: Family Medicine

## 2017-02-15 ENCOUNTER — Other Ambulatory Visit: Payer: Self-pay | Admitting: Family Medicine

## 2017-02-15 ENCOUNTER — Ambulatory Visit (INDEPENDENT_AMBULATORY_CARE_PROVIDER_SITE_OTHER): Payer: Medicare Other | Admitting: Family Medicine

## 2017-02-15 VITALS — BP 155/68 | HR 60 | Temp 97.1°F | Ht 62.0 in | Wt 139.4 lb

## 2017-02-15 DIAGNOSIS — I10 Essential (primary) hypertension: Secondary | ICD-10-CM | POA: Diagnosis not present

## 2017-02-15 DIAGNOSIS — I480 Paroxysmal atrial fibrillation: Secondary | ICD-10-CM | POA: Diagnosis not present

## 2017-02-15 MED ORDER — WARFARIN SODIUM 5 MG PO TABS: 5.0000 mg | ORAL_TABLET | Freq: Every day | ORAL | 3 refills | Status: DC

## 2017-02-15 NOTE — Progress Notes (Signed)
   HPI  Patient presents today here to follow-up for anticoagulation.  Patient was changed over to Eliquis due to difficulty titrating her Coumadin/INR. She states that Eliquis was difficult to afford, she did by 1 month supply.  She denies bleeding.  She would like to go back on Coumadin.    PMH: Smoking status noted ROS: Per HPI  Objective: BP (!) 155/68   Pulse 60   Temp (!) 97.1 F (36.2 C) (Oral)   Ht 5\' 2"  (1.575 m)   Wt 139 lb 6.4 oz (63.2 kg)   BMI 25.50 kg/m  Gen: NAD, alert, cooperative with exam HEENT: NCAT, CV: RRR, good S1/S2, no murmur Resp: CTABL, no wheezes, non-labored Ext: No edema, warm Neuro: Alert and oriented, No gross deficits  Assessment and plan:  #Atrial fibrillation, paroxysmal, chronic anticoagulation Changing from Eliquis back to Coumadin, overlap times 5 days,  Patient will come back 2 days after her last dose of Eliquis for INR.  A. fib is rate controlled  HTN- Normally  well controlled but up today. No Changes for now.   Meds ordered this encounter  Medications  . warfarin (COUMADIN) 5 MG tablet    Sig: Take 1 tablet (5 mg total) by mouth daily.    Dispense:  30 tablet    Refill:  [place], MD [facility] 02/15/2017, 1:19 PM

## 2017-02-15 NOTE — Patient Instructions (Signed)
   Start coumadin again 1 pill once daily, com ebcak in 7 days for a repeat INR  Take eliquis until Sunday, stop after that.

## 2017-02-17 ENCOUNTER — Other Ambulatory Visit: Payer: Self-pay | Admitting: Family Medicine

## 2017-02-17 NOTE — Progress Notes (Signed)
Cardiology Office Note    Date:  02/18/2017   ID:  [name], DOB 04/18/1932, MRN [id number]  PCP:  Timmothy Euler, MD  Cardiologist: Minus Breeding, MD    Chief Complaint  Patient presents with  . Hospitalization Follow-up    History of Present Illness:    [name] is a 84 y.o. female with past medical history of paroxysmal atrial fibrillation (on Coumadin and Flecainide), HTN, HLD, and CAD (minimal nonobstructive CAD by cath in 06/2016) who presents to the office today for hospital follow-up.  She was recently admitted to [facility] on 01/27/2017 for evaluation of chest pain and palpitations. EKG upon arrival to the ED showed atrial fibrillation with heart rate of 107 and no acute lab abnormalities.  he was monitored overnight and cyclic troponin values remain negative. She was followed on telemetry and converted back to normal sinus rhythm prior to discharge. She was continued on her current medication regimen with instructions to use an additional Flecainide 50mg  PRN for palpitations. Atenolol was not further titrated as her HR was in the 50's upon conversion to NSR. She followed up with her PCP in the outpatient setting and was switched to Eliquis due to difficult to control INR's. However, the medication was unaffordable and she switched back to Coumadin.  In talking with the patient today, she reports doing well from a cardiac perspective since her recent hospitalization. She denies any repeat episodes of palpitations. No recent chest discomfort, dyspnea on exertion, orthopnea, PND, or lower extremity edema.  She does report fatigue since discharge but says this is gradually improving.  She remains on Coumadin for anticoagulation and denies any evidence of active bleeding.  INR was stable at 2.3 when checked by her PCP on 02/01/2017.   Past Medical History:  Diagnosis Date  . Anemia   . Atrial fibrillation (HCC)    Paroxysmal, Flecainide therapy  . Calf pain    September, 2012, at rest  . Cardiomegaly    EF normal, echo, 2003  //   EF 60-65%, echo, June 21, 4096, diastolic dysfunction,  . Carotid bruit    Doppler, December, 2009, no abnormality  . Cataract   . Diverticulosis   . GERD (gastroesophageal reflux disease)   . History of shingles   . HLD (hyperlipidemia)   . HTN (hypertension)   . Hypothyroidism   . Hypothyroidism   . Insomnia   . Normal nuclear stress test    Normal, 2001  . Osteoarthritis   . PONV (postoperative nausea and vomiting)   . Rectal bleeding 2001   diverticulosis and int hemorrhoids on 07/1999 and 02/2010 colonoscopies.  . Stroke ([place])   . Warfarin anticoagulation     Past Surgical History:  Procedure Laterality Date  . BIOPSY  09/29/2015   Procedure: BIOPSY;  Surgeon: Rogene Houston, MD;  [facility]: [facility];  Service: Endoscopy;;  gastric  . CATARACT EXTRACTION W/PHACO Right 04/15/2014   Procedure: CATARACT EXTRACTION PHACO AND INTRAOCULAR LENS PLACEMENT (IOC);  Surgeon: Tonny Branch, MD;  Location: [facility];  Service: Ophthalmology;  Laterality: Right;  CDE: 13.30  . CATARACT EXTRACTION W/PHACO Left 05/13/2014   Procedure: CATARACT EXTRACTION PHACO AND INTRAOCULAR LENS PLACEMENT LEFT EYE;  Surgeon: Tonny Branch, MD;  Location: [facility];  Service: Ophthalmology;  Laterality: Left;  CDE:13.00  . CHOLECYSTECTOMY N/A 01/15/2015   Procedure: LAPAROSCOPIC CHOLECYSTECTOMY;  Surgeon: Ralene Ok, MD;  Location: [facility];  Service: General;  Laterality: N/A;  . COLONOSCOPY N/A  09/29/2015   Procedure: COLONOSCOPY;  Surgeon: Rogene Houston, MD;  [facility];  Service: Endoscopy;  Laterality: N/A;  . COLONOSCOPY    . ESOPHAGOGASTRODUODENOSCOPY N/A 01/13/2015   Procedure: ESOPHAGOGASTRODUODENOSCOPY (EGD);  Surgeon: Jerene Bears, MD;  Location: [facility] ENDOSCOPY;  Service: Endoscopy;  Laterality: N/A;  . ESOPHAGOGASTRODUODENOSCOPY N/A 09/29/2015   Procedure: ESOPHAGOGASTRODUODENOSCOPY (EGD);  Surgeon: Rogene Houston, MD;   [facility];  Service: Endoscopy;  Laterality: N/A;  12:15  . GALLBLADDER SURGERY    . LEFT HEART CATH AND CORONARY ANGIOGRAPHY N/A 06/22/2016   Procedure: Left Heart Cath and Coronary Angiography;  Surgeon: Troy Sine, MD;  Location: [facility];  Service: Cardiovascular;  Laterality: N/A;  . SHOULDER OPEN ROTATOR CUFF REPAIR     bilateral  . TOTAL KNEE ARTHROPLASTY  05/26/10   right  . TOTAL KNEE ARTHROPLASTY  11/17/2010   Procedure: TOTAL KNEE ARTHROPLASTY;  Surgeon: Mauri Pole;  Location: [facility];  Service: Orthopedics;  Laterality: Left;  . TOTAL KNEE ARTHROPLASTY     Right  . TOTAL SHOULDER REPLACEMENT  01/2010   left    Current Medications: Outpatient Medications Prior to Visit  Medication Sig Dispense Refill  . atenolol (TENORMIN) 50 MG tablet Take 1 tablet (50 mg total) by mouth daily. Take 1 tablet (50mg ) once daily 30 tablet 1  . atorvastatin (LIPITOR) 20 MG tablet TAKE 1 TABLET BY MOUTH AT  BEDTIME 90 tablet 1  . flecainide (TAMBOCOR) 50 MG tablet Take 1 tablet (50 mg total) by mouth 2 (two) times daily. 60 tablet 1  . furosemide (LASIX) 40 MG tablet TAKE 1/2 TABLET BY MOUTH  EVERY MORNING , THEN TAKE 1 TABLET BY MOUTH IF SWELLING OCCURS 90 tablet 1  . irbesartan (AVAPRO) 150 MG tablet Take 1 tablet (150 mg total) by mouth daily. 30 tablet 3  . levETIRAcetam (KEPPRA) 250 MG tablet TAKE 1 TABLET  BY MOUTH 2  TIMES DAILY. 180 tablet 0  . levothyroxine (SYNTHROID, LEVOTHROID) 50 MCG tablet Take 50 mcg by mouth daily before breakfast.     . mirtazapine (REMERON) 15 MG tablet TAKE ONE TABLET AT BEDTIME 30 tablet 5  . pantoprazole (PROTONIX) 40 MG tablet TAKE 1 TABLET DAILY 90 tablet 1  . potassium chloride SA (K-DUR,KLOR-CON) 20 MEQ tablet Take 10 mEq by mouth 2 (two) times daily.     . traZODone (DESYREL) 50 MG tablet Take 50 mg by mouth at bedtime.    Marland Kitchen warfarin (COUMADIN) 5 MG tablet Take 1 tablet (5 mg total) by mouth daily. 30 tablet 3  . aspirin EC  81 MG EC tablet Take 1 tablet (81 mg total) by mouth daily. With Breakfast 30 tablet 1  . mirtazapine (REMERON) 15 MG tablet Take 15 mg by mouth at bedtime.     No facility-administered medications prior to visit.      Allergies:   Penicillins   Social History   Socioeconomic History  . Marital status: Married    Spouse name: None  . Number of children: None  . Years of education: None  . Highest education level: None  Social Needs  . Financial resource strain: None  . Food insecurity - worry: None  . Food insecurity - inability: None  . Transportation needs - medical: None  . Transportation needs - non-medical: None  Occupational History  . None  Tobacco Use  . Smoking status: Never Smoker  . Smokeless tobacco: Never Used  Substance  and Sexual Activity  . Alcohol use: No  . Drug use: No  . Sexual activity: No  Other Topics Concern  . None  Social History Narrative  . None     Family History:  The patient's family history includes Arthritis in her mother; Atrial fibrillation in her brother; COPD in her brother; Colon cancer in her brother; Coronary artery disease in her brother; Diabetes in her brother; Early death in her father; Gallbladder disease in her brother; Heart attack in her daughter and father; Heart disease in her mother; Liver cancer in her brother; Rheum arthritis in her mother.   Review of Systems:   Please see the history of present illness.     General:  No chills, fever, night sweats or weight changes. Positive for fatigue (improving) Cardiovascular:  No chest pain, dyspnea on exertion, edema, orthopnea, palpitations, paroxysmal nocturnal dyspnea. Dermatological: No rash, lesions/masses Respiratory: No cough, dyspnea Urologic: No hematuria, dysuria Abdominal:   No nausea, vomiting, diarrhea, bright red blood per rectum, melena, or hematemesis Neurologic:  No visual changes, wkns, changes in mental status. All other systems reviewed and are otherwise  negative except as noted above.   Physical Exam:    VS:  BP 136/62   Pulse 60   Ht 5\' 2"  (1.575 m)   Wt 141 lb (64 kg)   SpO2 97%   BMI 25.79 kg/m    General: Well developed, well nourished Caucasian female appearing in no acute distress. Head: Normocephalic, atraumatic, sclera non-icteric, no xanthomas, nares are without discharge.  Neck: No carotid bruits. JVD not elevated.  Lungs: Respirations regular and unlabored, without wheezes or rales.  Heart: Regular rate and rhythm. No S3 or S4.  No murmur, no rubs, or gallops appreciated. Abdomen: Soft, non-tender, non-distended with normoactive bowel sounds. No hepatomegaly. No rebound/guarding. No obvious abdominal masses. Msk:  Strength and tone appear normal for age. No joint deformities or effusions. Extremities: No clubbing or cyanosis. No lower extremity edema.  Distal pedal pulses are 2+ bilaterally. Neuro: Alert and oriented X 3. Moves all extremities spontaneously. No focal deficits noted. Psych:  Responds to questions appropriately with a normal affect. Skin: No rashes or lesions noted  Wt Readings from Last 3 Encounters:  02/18/17 141 lb (64 kg)  02/15/17 139 lb 6.4 oz (63.2 kg)  02/01/17 140 lb (63.5 kg)     Studies/Labs Reviewed:   EKG:  EKG is not ordered today.   Recent Labs: 01/28/2017: ALT 16; Hemoglobin 12.0; Magnesium 1.7; Platelets 334; TSH 2.963 02/01/2017: BUN 11; Creatinine, Ser 0.82; Potassium 4.7; Sodium 133   Lipid Panel    Component Value Date/Time   CHOL 204 (H) 04/22/2015 0550   TRIG 43 04/22/2015 0550   HDL 88 04/22/2015 0550   CHOLHDL 2.3 04/22/2015 0550   VLDL 9 04/22/2015 0550   LDLCALC 107 (H) 04/22/2015 0550   LDLDIRECT 67 02/01/2017 1120    Additional studies/ records that were reviewed today include:   Echocardiogram: 04/24/2016 Study Conclusions  - Left ventricle: The cavity size was normal. There was mild focal   basal hypertrophy of the septum. Systolic function was normal.    The estimated ejection fraction was in the range of 60% to 65%.   Wall motion was normal; there were no regional wall motion   abnormalities. Features are consistent with a pseudonormal left   ventricular filling pattern, with concomitant abnormal relaxation   and increased filling pressure (grade 2 diastolic dysfunction). - Aortic valve: Trileaflet; mildly calcified  leaflets. - Mitral valve: Calcified annulus. There was mild regurgitation. - Left atrium: The atrium was mildly dilated. - Right atrium: Central venous pressure (est): 3 mm Hg. - Atrial septum: No defect or patent foramen ovale was identified. - Tricuspid valve: There was mild regurgitation. - Pulmonary arteries: PA peak pressure: 34 mm Hg (S). - Pericardium, extracardiac: There was no pericardial effusion.  Impressions:  - Mild basal septal hypertrophy with LVEF 60-65% and grade 2   diastolic dysfunction. Mildly calcified mitral and aortic   annulus. Mild mitral regurgitation. Mild left atrial enlargement.   Mild tricuspid regurgitation with PASP 34 mmHg.   Cardiac Catheterization: 06/2016  Ost LAD lesion, 20 %stenosed.  Prox LAD lesion, 20 %stenosed.  Prox Cx to Mid Cx lesion, 20 %stenosed.  Mid RCA lesion, 10 %stenosed.  There is hyperdynamic left ventricular systolic function.  LV end diastolic pressure is normal.  The left ventricular ejection fraction is greater than 65% by visual estimate.   Hyperdynamic LV function with an ejection fraction greater than 65% without focal segmental abnormalities.  No significant coronary obstructive disease with mild coronary calcification involving the LAD with 20% proximal and mid narrowings, 20% smooth stenosis in the left circumflex vessel, and 10% luminal irregularity in the mid RCA.  RECOMMENDATION: Medical therapy.  Assessment:    1. Paroxysmal atrial fibrillation (HCC)   2. Current use of long term anticoagulation   3. History of cardiac  catheterization   4. Essential hypertension   5. Hyperlipidemia LDL goal <70      Plan:   In order of problems listed above:  1. Paroxysmal Atrial Fibrillation/ Use of Long-Term Anticoagulation - the patient has a known history of paroxysmal atrial fibrillation and was recently admitted to [facility] on 01/27/2017 for evaluation of chest pain and palpitations, found to be in atrial fibrillation with heart rate in the 110's upon arrival. Spontaneously converted back to NSR overnight and discharged the following morning.  - She denies any recurrent palpitations. Reports breathing has been at baseline.  - will continue on current medication regimen of Atenolol 50mg  daily (would not further titrate due to episodes of bradycardia during recent admission) and Flecainide 50mg  BID. - she denies any evidence of active bleeding. Remains on Coumadin for anticoagulation (attempted Eliquis but the medication was unaffordable with her current insurance plan).   2. History of Cardiac Catheterization - prior catheterization in 06/2016 showed minimal, nonobstructive CAD as outlined above. - Recommend addressing the continued use of Flecainide at the time of her office visit with Dr. Lovena Le with known CAD. -Continue beta-blocker and statin therapy.  Will stop ASA secondary to the need for Coumadin.  3. HTN - BP is well controlled at 136/62 during today's visit. - Continue Atenolol 50 mg daily and Irbesartan 150mg  daily.  4. HLD - LDL recently checked by her PCP last month and at 28.  Continue with Atorvastatin 20 mg daily.  Medication Adjustments/Labs and Tests Ordered: Current medicines are reviewed at length with the patient today.  Concerns regarding medicines are outlined above.  Medication changes, Labs and Tests ordered today are listed in the Patient Instructions below. Patient Instructions  Your physician recommends that you schedule a follow-up appointment in:  With Dr Lovena Le  As planned  03/02/17  STOP Aspirin  No change in any other medications  No tests ordered today  Thank you for choosing [facility] !        Signed, Erma Heritage, PA-C  02/18/2017 4:32 PM    [facility] [address] [place], [place] [address] Phone: [phone]

## 2017-02-18 ENCOUNTER — Encounter: Payer: Self-pay | Admitting: Student

## 2017-02-18 ENCOUNTER — Ambulatory Visit: Payer: Medicare Other | Admitting: Student

## 2017-02-18 VITALS — BP 136/62 | HR 60 | Ht 62.0 in | Wt 141.0 lb

## 2017-02-18 DIAGNOSIS — E785 Hyperlipidemia, unspecified: Secondary | ICD-10-CM

## 2017-02-18 DIAGNOSIS — I480 Paroxysmal atrial fibrillation: Secondary | ICD-10-CM

## 2017-02-18 DIAGNOSIS — Z9889 Other specified postprocedural states: Secondary | ICD-10-CM

## 2017-02-18 DIAGNOSIS — I10 Essential (primary) hypertension: Secondary | ICD-10-CM | POA: Diagnosis not present

## 2017-02-18 DIAGNOSIS — Z7901 Long term (current) use of anticoagulants: Secondary | ICD-10-CM

## 2017-02-18 NOTE — Patient Instructions (Signed)
Your physician recommends that you schedule a follow-up appointment in:  With Dr Lovena Le  As planned 03/02/17    STOP Aspirin    No change in any other medications    No tests ordered today     Thank you for choosing [facility] !

## 2017-02-22 ENCOUNTER — Ambulatory Visit: Payer: Medicare Other | Admitting: Family Medicine

## 2017-02-22 ENCOUNTER — Telehealth: Payer: Self-pay | Admitting: Family Medicine

## 2017-02-22 NOTE — Telephone Encounter (Signed)
Earlier note erroneous, patient is on coumadin per latest OV with Dr. Wendi Snipes

## 2017-02-22 NOTE — Telephone Encounter (Signed)
Unsure according to notes whether this patient is taking Eliquis at this time or if she is on Coumadin.

## 2017-02-24 ENCOUNTER — Ambulatory Visit (INDEPENDENT_AMBULATORY_CARE_PROVIDER_SITE_OTHER): Payer: Medicare Other | Admitting: Family Medicine

## 2017-02-24 ENCOUNTER — Encounter: Payer: Self-pay | Admitting: Family Medicine

## 2017-02-24 VITALS — BP 170/76 | HR 58 | Temp 97.5°F | Ht 62.0 in | Wt 143.4 lb

## 2017-02-24 DIAGNOSIS — M810 Age-related osteoporosis without current pathological fracture: Secondary | ICD-10-CM | POA: Diagnosis not present

## 2017-02-24 DIAGNOSIS — I480 Paroxysmal atrial fibrillation: Secondary | ICD-10-CM | POA: Diagnosis not present

## 2017-02-24 DIAGNOSIS — I10 Essential (primary) hypertension: Secondary | ICD-10-CM

## 2017-02-24 DIAGNOSIS — Z7901 Long term (current) use of anticoagulants: Secondary | ICD-10-CM | POA: Diagnosis not present

## 2017-02-24 LAB — COAGUCHEK XS/INR WAIVED
INR: 1.7 — AB (ref 0.9–1.1)
PROTHROMBIN TIME: 20.9 s

## 2017-02-24 MED ORDER — WARFARIN SODIUM 5 MG PO TABS: 5.0000 mg | ORAL_TABLET | Freq: Every day | ORAL | 3 refills | Status: DC

## 2017-02-24 MED ORDER — LEVETIRACETAM 250 MG PO TABS: 250.0000 mg | ORAL_TABLET | Freq: Two times a day (BID) | ORAL | 0 refills | Status: DC

## 2017-02-24 MED ORDER — WARFARIN SODIUM 5 MG PO TABS: 5.0000 mg | ORAL_TABLET | Freq: Every day | ORAL | 0 refills | Status: DC

## 2017-02-24 MED ORDER — ALENDRONATE SODIUM 70 MG PO TABS: 70.0000 mg | ORAL_TABLET | ORAL | 3 refills | Status: DC

## 2017-02-24 NOTE — Patient Instructions (Signed)
Great to see you!  Conetinu coumadin 1 pill once daily, Come back in 2 weeks to have you INR checked again  Start fosamax 1 pill weekly Consider taking 2000 IU vitamin D3 daily.

## 2017-02-24 NOTE — Progress Notes (Signed)
   HPI  Patient presents today  Here for follow up  Atrial fibrillation Patient has successfully transitioned from Eliquis back to Coumadin.  We tried Eliquis but it was too expensive for her.  She has now been taking Coumadin 1 pill once daily for about 9 days. No bleeding, no diet changes.  Osteoporosis New diagnosis, reviewed DEXA scan Starting Fosamax today, patient is agreeable with this plan.  PMH: Smoking status noted ROS: Per HPI  Objective: BP (!) 170/76   Pulse (!) 58   Temp (!) 97.5 F (36.4 C) (Oral)   Ht 5\' 2"  (1.575 m)   Wt 143 lb 6.4 oz (65 kg)   BMI 26.23 kg/m  Gen: NAD, alert, cooperative with exam HEENT: NCAT CV: RRR Resp: CTABL, no wheezes, non-labored Ext: No edema, warm Neuro: Alert and oriented, No gross deficits  Assessment and plan:  #Atrial fibrillation, warfarin anticoagulation INR 1.7 today, slightly subtherapeutic, however she is very early in the treatment, repeat INR in 2 weeks, continue current dose Flecainide managed by cardiology,  continue atenolol  #Elevated today, however patient reports good medication compliance. Last visit just about 2 weeks ago was normal blood pressure, no changes for now Recheck in 2 weeks*  #Osteoporosis New diagnosis Starting Fosamax    Laroy Apple, MD [facility] 02/24/2017, 1:23 PM

## 2017-02-28 ENCOUNTER — Ambulatory Visit: Payer: Medicare Other | Admitting: Internal Medicine

## 2017-03-02 ENCOUNTER — Ambulatory Visit: Payer: Medicare Other | Admitting: Internal Medicine

## 2017-03-02 ENCOUNTER — Telehealth: Payer: Self-pay | Admitting: Family Medicine

## 2017-03-02 NOTE — Telephone Encounter (Signed)
Patient aware that you are out of the office today and will review in the morning.

## 2017-03-03 NOTE — Telephone Encounter (Signed)
Pt made appt for 03/08/17 to discuss insomnia

## 2017-03-03 NOTE — Telephone Encounter (Signed)
Considering her comorbidities I would not recommend ambien.   I think it is most appropriate to go ahead and plan on discussing this at our next visit. Laroy Apple, MD [facility] 03/03/2017, 10:50 AM

## 2017-03-08 ENCOUNTER — Encounter: Payer: Self-pay | Admitting: Family Medicine

## 2017-03-08 ENCOUNTER — Ambulatory Visit (INDEPENDENT_AMBULATORY_CARE_PROVIDER_SITE_OTHER): Payer: Medicare Other | Admitting: Family Medicine

## 2017-03-08 VITALS — BP 145/74 | HR 77 | Temp 97.0°F | Ht 62.0 in | Wt 136.6 lb

## 2017-03-08 DIAGNOSIS — Z7901 Long term (current) use of anticoagulants: Secondary | ICD-10-CM | POA: Diagnosis not present

## 2017-03-08 DIAGNOSIS — G4700 Insomnia, unspecified: Secondary | ICD-10-CM

## 2017-03-08 LAB — COAGUCHEK XS/INR WAIVED
INR: 1 (ref 0.9–1.1)
Prothrombin Time: 12 s

## 2017-03-08 NOTE — Progress Notes (Signed)
   HPI  Patient presents today for follow-up insomnia and INR.  No bleeding No changes in Coumadin compliance.  (After INR she admits to being unsure whether or not she has taken the medication) No change in diet.  States that she is recently started mirtazapine and states that she is feeling much better.  She reports good sleep.  She does have some awakenings but falls asleep easily. She wants to avoid Ambien if she can.  PMH: Smoking status noted ROS: Per HPI  Objective: BP (!) 145/74   Pulse 77   Temp (!) 97 F (36.1 C) (Oral)   Ht 5\' 2"  (1.575 m)   Wt 136 lb 9.6 oz (62 kg)   BMI 24.98 kg/m  Gen: NAD, alert, cooperative with exam HEENT: NCAT, EOMI, PERRL CV: RRR, good S1/S2, no murmur Resp: CTABL, no wheezes, non-labored Ext: No edema, warm Neuro: Alert and oriented, No gross deficits  Assessment and plan:  #Coumadin anticoagulation Indication is A. fib INR goal 2-3   INR is 1.0 today. On discussing with patient she is very unsure whether or not she has been taking pills. She has not had any recent falls I am concerned that her dementia may be progressing farther than is safe to treat with Coumadin. For now will try to restart Coumadin 1 pill once daily and recheck INR in 2 weeks   #Insomnia Improving with Remeron, no changes Avoid Ambien if possible Also avoid other sleeping medications, discontinue trazodone, patient was not taking this    Orders Placed This Encounter  Procedures  . CoaguChek XS/INR Carney Bern, MD [facility] 03/08/2017, 3:12 PM

## 2017-03-10 ENCOUNTER — Other Ambulatory Visit: Payer: Self-pay | Admitting: *Deleted

## 2017-03-10 MED ORDER — IRBESARTAN 150 MG PO TABS: 150.0000 mg | ORAL_TABLET | Freq: Every day | ORAL | 0 refills | Status: DC

## 2017-03-14 ENCOUNTER — Telehealth: Payer: Self-pay | Admitting: Cardiology

## 2017-03-14 NOTE — Telephone Encounter (Signed)
Closed Encounter  °

## 2017-03-15 ENCOUNTER — Other Ambulatory Visit: Payer: Self-pay

## 2017-03-15 ENCOUNTER — Observation Stay (HOSPITAL_COMMUNITY): Payer: Medicare Other

## 2017-03-15 ENCOUNTER — Encounter (HOSPITAL_COMMUNITY): Payer: Self-pay | Admitting: *Deleted

## 2017-03-15 ENCOUNTER — Observation Stay (HOSPITAL_COMMUNITY)
Admission: EM | Admit: 2017-03-15 | Discharge: 2017-03-17 | Disposition: A | Discharge status: Home / Self Care | Payer: Medicare Other | Attending: Internal Medicine | Admitting: Internal Medicine

## 2017-03-15 ENCOUNTER — Emergency Department (HOSPITAL_COMMUNITY): Payer: Medicare Other

## 2017-03-15 DIAGNOSIS — M1990 Unspecified osteoarthritis, unspecified site: Secondary | ICD-10-CM | POA: Insufficient documentation

## 2017-03-15 DIAGNOSIS — G459 Transient cerebral ischemic attack, unspecified: Principal | ICD-10-CM | POA: Insufficient documentation

## 2017-03-15 DIAGNOSIS — K529 Noninfective gastroenteritis and colitis, unspecified: Secondary | ICD-10-CM | POA: Insufficient documentation

## 2017-03-15 DIAGNOSIS — H5462 Unqualified visual loss, left eye, normal vision right eye: Secondary | ICD-10-CM | POA: Insufficient documentation

## 2017-03-15 DIAGNOSIS — Z7901 Long term (current) use of anticoagulants: Secondary | ICD-10-CM | POA: Insufficient documentation

## 2017-03-15 DIAGNOSIS — Z8719 Personal history of other diseases of the digestive system: Secondary | ICD-10-CM | POA: Diagnosis not present

## 2017-03-15 DIAGNOSIS — R569 Unspecified convulsions: Secondary | ICD-10-CM | POA: Insufficient documentation

## 2017-03-15 DIAGNOSIS — R1110 Vomiting, unspecified: Secondary | ICD-10-CM | POA: Diagnosis not present

## 2017-03-15 DIAGNOSIS — R791 Abnormal coagulation profile: Secondary | ICD-10-CM | POA: Insufficient documentation

## 2017-03-15 DIAGNOSIS — I5032 Chronic diastolic (congestive) heart failure: Secondary | ICD-10-CM | POA: Diagnosis not present

## 2017-03-15 DIAGNOSIS — I10 Essential (primary) hypertension: Secondary | ICD-10-CM | POA: Diagnosis not present

## 2017-03-15 DIAGNOSIS — I700 Atherosclerosis of aorta: Secondary | ICD-10-CM | POA: Insufficient documentation

## 2017-03-15 DIAGNOSIS — R42 Dizziness and giddiness: Secondary | ICD-10-CM | POA: Diagnosis not present

## 2017-03-15 DIAGNOSIS — K219 Gastro-esophageal reflux disease without esophagitis: Secondary | ICD-10-CM | POA: Insufficient documentation

## 2017-03-15 DIAGNOSIS — Z8673 Personal history of transient ischemic attack (TIA), and cerebral infarction without residual deficits: Secondary | ICD-10-CM | POA: Diagnosis present

## 2017-03-15 DIAGNOSIS — J439 Emphysema, unspecified: Secondary | ICD-10-CM | POA: Insufficient documentation

## 2017-03-15 DIAGNOSIS — R41841 Cognitive communication deficit: Secondary | ICD-10-CM | POA: Insufficient documentation

## 2017-03-15 DIAGNOSIS — R200 Anesthesia of skin: Secondary | ICD-10-CM | POA: Diagnosis not present

## 2017-03-15 DIAGNOSIS — E039 Hypothyroidism, unspecified: Secondary | ICD-10-CM | POA: Diagnosis not present

## 2017-03-15 DIAGNOSIS — Z8249 Family history of ischemic heart disease and other diseases of the circulatory system: Secondary | ICD-10-CM | POA: Insufficient documentation

## 2017-03-15 DIAGNOSIS — Z9049 Acquired absence of other specified parts of digestive tract: Secondary | ICD-10-CM | POA: Insufficient documentation

## 2017-03-15 DIAGNOSIS — R29818 Other symptoms and signs involving the nervous system: Secondary | ICD-10-CM | POA: Diagnosis not present

## 2017-03-15 DIAGNOSIS — E871 Hypo-osmolality and hyponatremia: Secondary | ICD-10-CM | POA: Insufficient documentation

## 2017-03-15 DIAGNOSIS — Z800 Family history of malignant neoplasm of digestive organs: Secondary | ICD-10-CM | POA: Insufficient documentation

## 2017-03-15 DIAGNOSIS — Z8619 Personal history of other infectious and parasitic diseases: Secondary | ICD-10-CM | POA: Insufficient documentation

## 2017-03-15 DIAGNOSIS — Z833 Family history of diabetes mellitus: Secondary | ICD-10-CM | POA: Insufficient documentation

## 2017-03-15 DIAGNOSIS — Z9841 Cataract extraction status, right eye: Secondary | ICD-10-CM | POA: Insufficient documentation

## 2017-03-15 DIAGNOSIS — Z9842 Cataract extraction status, left eye: Secondary | ICD-10-CM | POA: Insufficient documentation

## 2017-03-15 DIAGNOSIS — G894 Chronic pain syndrome: Secondary | ICD-10-CM | POA: Diagnosis not present

## 2017-03-15 DIAGNOSIS — E785 Hyperlipidemia, unspecified: Secondary | ICD-10-CM | POA: Diagnosis not present

## 2017-03-15 DIAGNOSIS — H5340 Unspecified visual field defects: Secondary | ICD-10-CM | POA: Diagnosis not present

## 2017-03-15 DIAGNOSIS — I480 Paroxysmal atrial fibrillation: Secondary | ICD-10-CM | POA: Insufficient documentation

## 2017-03-15 DIAGNOSIS — K5790 Diverticulosis of intestine, part unspecified, without perforation or abscess without bleeding: Secondary | ICD-10-CM | POA: Insufficient documentation

## 2017-03-15 DIAGNOSIS — I200 Unstable angina: Secondary | ICD-10-CM | POA: Insufficient documentation

## 2017-03-15 DIAGNOSIS — G4700 Insomnia, unspecified: Secondary | ICD-10-CM | POA: Insufficient documentation

## 2017-03-15 DIAGNOSIS — D649 Anemia, unspecified: Secondary | ICD-10-CM | POA: Diagnosis not present

## 2017-03-15 DIAGNOSIS — G43109 Migraine with aura, not intractable, without status migrainosus: Secondary | ICD-10-CM | POA: Diagnosis not present

## 2017-03-15 DIAGNOSIS — Z880 Allergy status to penicillin: Secondary | ICD-10-CM | POA: Insufficient documentation

## 2017-03-15 DIAGNOSIS — I110 Hypertensive heart disease with heart failure: Secondary | ICD-10-CM | POA: Insufficient documentation

## 2017-03-15 DIAGNOSIS — I493 Ventricular premature depolarization: Secondary | ICD-10-CM | POA: Insufficient documentation

## 2017-03-15 DIAGNOSIS — Z79899 Other long term (current) drug therapy: Secondary | ICD-10-CM | POA: Insufficient documentation

## 2017-03-15 DIAGNOSIS — I4892 Unspecified atrial flutter: Secondary | ICD-10-CM | POA: Insufficient documentation

## 2017-03-15 DIAGNOSIS — Z7982 Long term (current) use of aspirin: Secondary | ICD-10-CM | POA: Diagnosis not present

## 2017-03-15 DIAGNOSIS — N3281 Overactive bladder: Secondary | ICD-10-CM | POA: Insufficient documentation

## 2017-03-15 DIAGNOSIS — Z836 Family history of other diseases of the respiratory system: Secondary | ICD-10-CM | POA: Insufficient documentation

## 2017-03-15 DIAGNOSIS — Z96653 Presence of artificial knee joint, bilateral: Secondary | ICD-10-CM | POA: Insufficient documentation

## 2017-03-15 DIAGNOSIS — H539 Unspecified visual disturbance: Secondary | ICD-10-CM | POA: Diagnosis not present

## 2017-03-15 DIAGNOSIS — Z8261 Family history of arthritis: Secondary | ICD-10-CM | POA: Insufficient documentation

## 2017-03-15 DIAGNOSIS — Z96612 Presence of left artificial shoulder joint: Secondary | ICD-10-CM | POA: Insufficient documentation

## 2017-03-15 DIAGNOSIS — N3001 Acute cystitis with hematuria: Secondary | ICD-10-CM | POA: Insufficient documentation

## 2017-03-15 LAB — URINALYSIS, ROUTINE W REFLEX MICROSCOPIC
Bilirubin Urine: NEGATIVE
Glucose, UA: NEGATIVE mg/dL
Hgb urine dipstick: NEGATIVE
Ketones, ur: NEGATIVE mg/dL
LEUKOCYTES UA: NEGATIVE
NITRITE: NEGATIVE
PH: 5 (ref 5.0–8.0)
Protein, ur: NEGATIVE mg/dL
SPECIFIC GRAVITY, URINE: 1.008 (ref 1.005–1.030)

## 2017-03-15 LAB — CBC
HCT: 37.6 % (ref 36.0–46.0)
HEMOGLOBIN: 12 g/dL (ref 12.0–15.0)
MCH: 30.3 pg (ref 26.0–34.0)
MCHC: 31.9 g/dL (ref 30.0–36.0)
MCV: 94.9 fL (ref 78.0–100.0)
PLATELETS: 243 10*3/uL (ref 150–400)
RBC: 3.96 MIL/uL (ref 3.87–5.11)
RDW: 14.2 % (ref 11.5–15.5)
WBC: 5.8 10*3/uL (ref 4.0–10.5)

## 2017-03-15 LAB — COMPREHENSIVE METABOLIC PANEL
ALBUMIN: 3.6 g/dL (ref 3.5–5.0)
ALT: 11 U/L — ABNORMAL LOW (ref 14–54)
ANION GAP: 11 (ref 5–15)
AST: 19 U/L (ref 15–41)
Alkaline Phosphatase: 56 U/L (ref 38–126)
BILIRUBIN TOTAL: 0.7 mg/dL (ref 0.3–1.2)
BUN: 16 mg/dL (ref 6–20)
CHLORIDE: 93 mmol/L — AB (ref 101–111)
CO2: 27 mmol/L (ref 22–32)
Calcium: 8.6 mg/dL — ABNORMAL LOW (ref 8.9–10.3)
Creatinine, Ser: 0.83 mg/dL (ref 0.44–1.00)
GFR calc Af Amer: >60 mL/min (ref >60)
GFR calc non Af Amer: >60 mL/min (ref >60)
GLUCOSE: 97 mg/dL (ref 65–99)
POTASSIUM: 5 mmol/L (ref 3.5–5.1)
SODIUM: 131 mmol/L — AB (ref 135–145)
Total Protein: 6.7 g/dL (ref 6.5–8.1)

## 2017-03-15 LAB — APTT: APTT: 41 s — AB (ref 24–36)

## 2017-03-15 LAB — DIFFERENTIAL
BASOS ABS: 0.1 10*3/uL (ref 0.0–0.1)
BASOS PCT: 1 %
EOS ABS: 0.2 10*3/uL (ref 0.0–0.7)
EOS PCT: 3 %
Lymphocytes Relative: 22 %
Lymphs Abs: 1.3 10*3/uL (ref 0.7–4.0)
Monocytes Absolute: 0.9 10*3/uL (ref 0.1–1.0)
Monocytes Relative: 15 %
NEUTROS PCT: 59 %
Neutro Abs: 3.4 10*3/uL (ref 1.7–7.7)

## 2017-03-15 LAB — PROTIME-INR
INR: 1.83
PROTHROMBIN TIME: 21 s — AB (ref 11.4–15.2)

## 2017-03-15 LAB — I-STAT TROPONIN, ED: Troponin i, poc: 0.02 ng/mL (ref 0.00–0.08)

## 2017-03-15 MED ORDER — ACETAMINOPHEN 650 MG RE SUPP: 650.0000 mg | RECTAL | Status: DC | PRN

## 2017-03-15 MED ORDER — WARFARIN SODIUM 5 MG PO TABS
5.0000 mg | ORAL_TABLET | Freq: Every day | ORAL | Status: DC
Start: 2017-03-15 — End: 2017-03-15

## 2017-03-15 MED ORDER — LEVETIRACETAM 250 MG PO TABS
250.0000 mg | ORAL_TABLET | Freq: Two times a day (BID) | ORAL | Status: DC
  Administered 2017-03-15 – 2017-03-16 (×2): 250 mg via ORAL
  Filled 2017-03-15 (×2): qty 1

## 2017-03-15 MED ORDER — WARFARIN - PHARMACIST DOSING INPATIENT
Status: DC
Start: 2017-03-16 — End: 2017-03-17
  Administered 2017-03-16: 18:00:00

## 2017-03-15 MED ORDER — WARFARIN SODIUM 5 MG PO TABS
5.0000 mg | ORAL_TABLET | Freq: Once | ORAL | Status: AC
  Administered 2017-03-15: 5 mg via ORAL
  Filled 2017-03-15: qty 1

## 2017-03-15 MED ORDER — ASPIRIN 300 MG RE SUPP: 300.0000 mg | Freq: Every day | RECTAL | Status: DC

## 2017-03-15 MED ORDER — ACETAMINOPHEN 325 MG PO TABS
650.0000 mg | ORAL_TABLET | ORAL | Status: DC | PRN
  Administered 2017-03-16 (×2): 650 mg via ORAL
  Filled 2017-03-15 (×2): qty 2

## 2017-03-15 MED ORDER — ATORVASTATIN CALCIUM 20 MG PO TABS
20.0000 mg | ORAL_TABLET | Freq: Every day | ORAL | Status: DC
  Administered 2017-03-15 – 2017-03-16 (×2): 20 mg via ORAL
  Filled 2017-03-15 (×3): qty 1

## 2017-03-15 MED ORDER — MIRTAZAPINE 15 MG PO TABS
15.0000 mg | ORAL_TABLET | Freq: Every day | ORAL | Status: DC
  Administered 2017-03-16: 15 mg via ORAL
  Filled 2017-03-15 (×2): qty 1

## 2017-03-15 MED ORDER — PANTOPRAZOLE SODIUM 40 MG PO TBEC
40.0000 mg | DELAYED_RELEASE_TABLET | Freq: Every day | ORAL | Status: DC
  Administered 2017-03-16 – 2017-03-17 (×2): 40 mg via ORAL
  Filled 2017-03-15 (×2): qty 1

## 2017-03-15 MED ORDER — STROKE: EARLY STAGES OF RECOVERY BOOK
Freq: Once | Status: AC
  Administered 2017-03-16: 08:00:00
  Filled 2017-03-15: qty 1

## 2017-03-15 MED ORDER — IRBESARTAN 300 MG PO TABS
150.0000 mg | ORAL_TABLET | Freq: Every day | ORAL | Status: DC
  Filled 2017-03-15: qty 1

## 2017-03-15 MED ORDER — POTASSIUM CHLORIDE CRYS ER 10 MEQ PO TBCR
10.0000 meq | EXTENDED_RELEASE_TABLET | Freq: Two times a day (BID) | ORAL | Status: DC
  Administered 2017-03-15: 10 meq via ORAL
  Filled 2017-03-15: qty 1

## 2017-03-15 MED ORDER — ACETAMINOPHEN 160 MG/5ML PO SOLN: 650.0000 mg | ORAL | Status: DC | PRN

## 2017-03-15 MED ORDER — ASPIRIN 325 MG PO TABS
325.0000 mg | ORAL_TABLET | Freq: Every day | ORAL | Status: DC
  Administered 2017-03-15 – 2017-03-17 (×3): 325 mg via ORAL
  Filled 2017-03-15 (×3): qty 1

## 2017-03-15 MED ORDER — LEVOTHYROXINE SODIUM 50 MCG PO TABS
50.0000 ug | ORAL_TABLET | Freq: Every day | ORAL | Status: DC
  Administered 2017-03-16 – 2017-03-17 (×2): 50 ug via ORAL
  Filled 2017-03-15 (×2): qty 1

## 2017-03-15 NOTE — ED Triage Notes (Addendum)
Pt c/o left eye foggy/haziness and left arm numbness that started about 1130 and lasted about 30 minutes. Pt reports she now has no vision problems or numbness. Pt has no facial droop or unilateral weakness or drift in triage.

## 2017-03-15 NOTE — ED Notes (Signed)
Pt back from MRI 

## 2017-03-15 NOTE — H&P (Signed)
History and Physical    [name] XBM:[id number] DOB: 1932/01/25 DOA: 03/15/2017  PCP: Timmothy Euler, MD  Patient coming from: Home  Chief Complaint: Left vision loss and left facial and left arm numbness  HPI: [name] is a 84 y.o. female with medical history significant of A. fib on Coumadin, hypertension, hyperlipidemia comes in with acute onset of left vision loss that occurred earlier this morning around 11 AM associated with left facial numbness and left arm numbness.  She reports that this resolved after about 20 minutes of symptoms.  She came to the ED where she was having a stroke.  She denies any recent illnesses.  Currently she denies any weakness numbness tingling or vision changes at this time.  She reports she is compliant with her Coumadin therapy.  Patient is referred for possible TIA workup.   Review of Systems: As per HPI otherwise 10 point review of systems negative.   Past Medical History:  Diagnosis Date  . Anemia   . Atrial fibrillation (HCC)    Paroxysmal, Flecainide therapy  . Calf pain    September, 2012, at rest  . Cardiomegaly    EF normal, echo, 2003  //   EF 60-65%, echo, June 22, 270, diastolic dysfunction,  . Carotid bruit    Doppler, December, 2009, no abnormality  . Cataract   . Diverticulosis   . GERD (gastroesophageal reflux disease)   . History of shingles   . HLD (hyperlipidemia)   . HTN (hypertension)   . Hypothyroidism   . Hypothyroidism   . Insomnia   . Normal nuclear stress test    Normal, 2001  . Osteoarthritis   . PONV (postoperative nausea and vomiting)   . Rectal bleeding 2001   diverticulosis and int hemorrhoids on 07/1999 and 02/2010 colonoscopies.  . Stroke ([place])   . Warfarin anticoagulation     Past Surgical History:  Procedure Laterality Date  . BIOPSY  09/29/2015   Procedure: BIOPSY;  Surgeon: Rogene Houston, MD;  [facility];  Service: Endoscopy;;  gastric  . CATARACT EXTRACTION W/PHACO  Right 04/15/2014   Procedure: CATARACT EXTRACTION PHACO AND INTRAOCULAR LENS PLACEMENT (IOC);  Surgeon: Tonny Branch, MD;  Location: [facility];  Service: Ophthalmology;  Laterality: Right;  CDE: 13.30  . CATARACT EXTRACTION W/PHACO Left 05/13/2014   Procedure: CATARACT EXTRACTION PHACO AND INTRAOCULAR LENS PLACEMENT LEFT EYE;  Surgeon: Tonny Branch, MD;  Location: [facility];  Service: Ophthalmology;  Laterality: Left;  CDE:13.00  . CHOLECYSTECTOMY N/A 01/15/2015   Procedure: LAPAROSCOPIC CHOLECYSTECTOMY;  Surgeon: Ralene Ok, MD;  Location: [facility];  Service: General;  Laterality: N/A;  . COLONOSCOPY N/A 09/29/2015   Procedure: COLONOSCOPY;  Surgeon: Rogene Houston, MD;  [facility];  Service: Endoscopy;  Laterality: N/A;  . COLONOSCOPY    . ESOPHAGOGASTRODUODENOSCOPY N/A 01/13/2015   Procedure: ESOPHAGOGASTRODUODENOSCOPY (EGD);  Surgeon: Jerene Bears, MD;  Location: [facility];  Service: Endoscopy;  Laterality: N/A;  . ESOPHAGOGASTRODUODENOSCOPY N/A 09/29/2015   Procedure: ESOPHAGOGASTRODUODENOSCOPY (EGD);  Surgeon: Rogene Houston, MD;  [facility];  Service: Endoscopy;  Laterality: N/A;  12:15  . GALLBLADDER SURGERY    . LEFT HEART CATH AND CORONARY ANGIOGRAPHY N/A 06/22/2016   Procedure: Left Heart Cath and Coronary Angiography;  Surgeon: Troy Sine, MD;  Location: [facility];  Service: Cardiovascular;  Laterality: N/A;  . SHOULDER OPEN ROTATOR CUFF REPAIR     bilateral  . TOTAL KNEE  ARTHROPLASTY  05/26/10   right  . TOTAL KNEE ARTHROPLASTY  11/17/2010   Procedure: TOTAL KNEE ARTHROPLASTY;  Surgeon: Mauri Pole;  Location: [facility];  Service: Orthopedics;  Laterality: Left;  . TOTAL KNEE ARTHROPLASTY     Right  . TOTAL SHOULDER REPLACEMENT  01/2010   left     reports that  has never smoked. she has never used smokeless tobacco. She reports that she does not drink alcohol or use drugs.  Allergies  Allergen Reactions  . Penicillins Rash and Other (See  Comments)    Has patient had a PCN reaction causing immediate rash, facial/tongue/throat swelling, SOB or lightheadedness with hypotension: No Has patient had a PCN reaction causing severe rash involving mucus membranes or skin necrosis: No Has patient had a PCN reaction that required hospitalization No Has patient had a PCN reaction occurring within the last 10 years: No If all of the above answers are "NO", then may proceed with Cephalosporin use.    Family History  Problem Relation Age of Onset  . COPD Brother   . Atrial fibrillation Brother   . Diabetes Brother   . Liver cancer Brother        Alcohol-related  . Colon cancer Brother   . Heart attack Father   . Early death Father   . Rheum arthritis Mother   . Arthritis Mother   . Heart disease Mother   . Coronary artery disease Brother   . Heart attack Daughter   . Gallbladder disease Brother     Prior to Admission medications   Medication Sig Start Date End Date Taking? Authorizing Provider  Acetaminophen (TYLENOL) 325 MG CAPS Take 650 mg by mouth every 6 (six) hours as needed for mild pain.   Yes [provider]  ARTIFICIAL TEAR OP Apply 2 drops to eye daily as needed (Dryness). To the left eye   Yes [provider]  atenolol (TENORMIN) 50 MG tablet Take 1 tablet (50 mg total) by mouth daily. Take 1 tablet (50mg ) once daily Patient taking differently: Take 25 mg by mouth 2 (two) times daily. Take 1 tablet (50mg ) once daily 01/29/17  Yes Emokpae, Courage, MD  atorvastatin (LIPITOR) 20 MG tablet TAKE 1 TABLET BY MOUTH AT  BEDTIME 10/08/16  Yes Timmothy Euler, MD  flecainide (TAMBOCOR) 50 MG tablet Take 1 tablet (50 mg total) by mouth 2 (two) times daily. Patient taking differently: Take 25 mg by mouth 2 (two) times daily.  01/28/17  Yes Emokpae, Courage, MD  furosemide (LASIX) 40 MG tablet TAKE 1/2 TABLET BY MOUTH  EVERY MORNING , THEN TAKE 1 TABLET BY MOUTH IF SWELLING OCCURS 02/22/17  Yes Timmothy Euler,  MD  irbesartan (AVAPRO) 150 MG tablet Take 1 tablet (150 mg total) by mouth daily. 03/10/17  Yes Timmothy Euler, MD  levETIRAcetam (KEPPRA) 250 MG tablet Take 1 tablet (250 mg total) by mouth 2 (two) times daily. Patient taking differently: Take 125 mg by mouth 2 (two) times daily.  02/24/17  Yes Timmothy Euler, MD  levothyroxine (SYNTHROID, LEVOTHROID) 50 MCG tablet Take 50 mcg by mouth daily before breakfast.  06/03/16  Yes [provider]  mirtazapine (REMERON) 15 MG tablet TAKE ONE TABLET AT BEDTIME 02/17/17  Yes Timmothy Euler, MD  pantoprazole (PROTONIX) 40 MG tablet TAKE 1 TABLET DAILY 01/11/17  Yes Timmothy Euler, MD  potassium chloride SA (K-DUR,KLOR-CON) 20 MEQ tablet Take 10 mEq by mouth 2 (two) times daily.  Yes [provider]  warfarin (COUMADIN) 5 MG tablet Take 1-2 tablets (5-10 mg total) by mouth daily. Patient taking differently: Take 5 mg by mouth daily.  02/24/17  Yes Timmothy Euler, MD    Physical Exam: Vitals:   03/15/17 1330 03/15/17 1400 03/15/17 1430 03/15/17 1500  BP: 134/62 (!) 125/43 (!) 112/54 132/82  Pulse:  (!) 41    Resp: 17 16 13 12   Temp:      TempSrc:      SpO2: 100% 96%    Weight:      Height:          Constitutional: NAD, calm, comfortable Vitals:   03/15/17 1330 03/15/17 1400 03/15/17 1430 03/15/17 1500  BP: 134/62 (!) 125/43 (!) 112/54 132/82  Pulse:  (!) 41    Resp: 17 16 13 12   Temp:      TempSrc:      SpO2: 100% 96%    Weight:      Height:       Eyes: PERRL, lids and conjunctivae normal ENMT: Mucous membranes are moist. Posterior pharynx clear of any exudate or lesions.Normal dentition.  Neck: normal, supple, no masses, no thyromegaly Respiratory: clear to auscultation bilaterally, no wheezing, no crackles. Normal respiratory effort. No accessory muscle use.  Cardiovascular: Regular rate and rhythm, no murmurs / rubs / gallops. No extremity edema. 2+ pedal pulses. No carotid bruits.  Abdomen: no  tenderness, no masses palpated. No hepatosplenomegaly. Bowel sounds positive.  Musculoskeletal: no clubbing / cyanosis. No joint deformity upper and lower extremities. Good ROM, no contractures. Normal muscle tone.  Skin: no rashes, lesions, ulcers. No induration Neurologic: CN 2-12 grossly intact. Sensation intact, DTR normal. Strength 5/5 in all 4.  Psychiatric: Normal judgment and insight. Alert and oriented x 3. Normal mood.    Labs on Admission: I have personally reviewed following labs and imaging studies  CBC: Recent Labs  Lab 03/15/17 1320  WBC 5.8  NEUTROABS 3.4  HGB 12.0  HCT 37.6  MCV 94.9  PLT 242   Basic Metabolic Panel: Recent Labs  Lab 03/15/17 1320  NA 131*  K 5.0  CL 93*  CO2 27  GLUCOSE 97  BUN 16  CREATININE 0.83  CALCIUM 8.6*   GFR: Estimated Creatinine Clearance: 41.6 mL/min (by C-G formula based on SCr of 0.83 mg/dL). Liver Function Tests: Recent Labs  Lab 03/15/17 1320  AST 19  ALT 11*  ALKPHOS 56  BILITOT 0.7  PROT 6.7  ALBUMIN 3.6   No results for input(s): LIPASE, AMYLASE in the last 168 hours. No results for input(s): AMMONIA in the last 168 hours. Coagulation Profile: Recent Labs  Lab 03/15/17 1320  INR 1.83   Cardiac Enzymes: No results for input(s): CKTOTAL, CKMB, CKMBINDEX, TROPONINI in the last 168 hours. BNP (last 3 results) No results for input(s): PROBNP in the last 8760 hours. HbA1C: No results for input(s): HGBA1C in the last 72 hours. CBG: No results for input(s): GLUCAP in the last 168 hours. Lipid Profile: No results for input(s): CHOL, HDL, LDLCALC, TRIG, CHOLHDL, LDLDIRECT in the last 72 hours. Thyroid Function Tests: No results for input(s): TSH, T4TOTAL, FREET4, T3FREE, THYROIDAB in the last 72 hours. Anemia Panel: No results for input(s): VITAMINB12, FOLATE, FERRITIN, TIBC, IRON, RETICCTPCT in the last 72 hours. Urine analysis:    Component Value Date/Time   COLORURINE YELLOW 03/15/2017 [place] 03/15/2017 1256   APPEARANCEUR Clear 11/01/2016 1550   LABSPEC 1.008 03/15/2017 1256  PHURINE 5.0 03/15/2017 1256   GLUCOSEU NEGATIVE 03/15/2017 1256   HGBUR NEGATIVE 03/15/2017 [place] 03/15/2017 1256   BILIRUBINUR Negative 11/01/2016 [place] 03/15/2017 1256   PROTEINUR NEGATIVE 03/15/2017 1256   UROBILINOGEN 0.2 11/09/2010 1235   NITRITE NEGATIVE 03/15/2017 1256   LEUKOCYTESUR NEGATIVE 03/15/2017 1256   LEUKOCYTESUR Negative 11/01/2016 1550   Sepsis Labs: !!!!!!!!!!!!!!!!!!!!!!!!!!!!!!!!!!!!!!!!!!!! @LABRCNTIP (procalcitonin:4,lacticidven:4) )No results found for this or any previous visit (from the past 240 hour(s)).   Radiological Exams on Admission: Ct Head Wo Contrast  Result Date: 03/15/2017 CLINICAL DATA:  LEFT vision changes, LEFT arm numbness for 30 minutes beginning at 11:30 a.m. Assess TIA. History of hypertension, hyperlipidemia and stroke. EXAM: CT HEAD WITHOUT CONTRAST TECHNIQUE: Contiguous axial images were obtained from the base of the skull through the vertex without intravenous contrast. COMPARISON:  MRI of the head December 06, 2016 FINDINGS: BRAIN: No intraparenchymal hemorrhage, mass effect nor midline shift. The ventricles and sulci are normal for age. Mild patchy to confluent supratentorial white matter hypodensities. No acute large vascular territory infarcts. No abnormal extra-axial fluid collections. Basal cisterns are patent. VASCULAR: Mild to moderate calcific atherosclerosis of the carotid siphons. SKULL: No skull fracture. Osteopenia. Moderate temporomandibular osteoarthrosis. No significant scalp soft tissue swelling. SINUSES/ORBITS: Trace LEFT mastoid effusion.The included ocular globes and orbital contents are non-suspicious. Status post bilateral ocular lens implants. Partially imaged potential LEFT dacryocyst. OTHER: None. IMPRESSION: 1. No acute intracranial process. 2. Stable moderate to severe chronic  small vessel ischemic disease. Electronically Signed   By: Elon Alas M.D.   On: 03/15/2017 14:24    EKG: Independently reviewed.  Normal sinus rhythm with bigeminy and left anterior fascicular block Old chart reviewed Case discussed with Dr. Lita Mains in the ED  Assessment/Plan 84 year old female with a history of A. fib on Coumadin comes in with 20-minute episode of TIA symptoms Principal Problem:   TIA (transient ischemic attack)-left vision loss along with left-sided facial numbness and arm numbness.  Will place on aspirin.  Observation on telemetry monitoring.  Obtain cardiac echo.  Obtain MRI brain.  Obtain Doppler of carotids.  Patient will be transferred to [facility] for neurological services as this is not available at [facility] this week.  Dr. Cheral Marker with neuro has been notified by the emergency department here in [facility].  Active Problems:   Warfarin anticoagulation-INR is 1.8.  Will obtain pharmacy to adjust Coumadin.    Paroxysmal atrial fibrillation (HCC)-continue Coumadin    Chronic diastolic CHF (congestive heart failure) (HCC)-stable at this time continue home diuretics    Chronic pain syndrome-stable    HTN (hypertension)-allow permissive hypertension.  Blood pressure mostly normal at this time  Brief episode of bradycardia-continue telemetry monitoring.  She had a asymptomatic.  Of heart rate in the 40s earlier in the ED.     DVT prophylaxis: Coumadin Code Status: Full Family Communication: None Disposition Plan: Per day team Consults called: Neurology Admission status: Observation   Bennie Chirico A MD [facility]  If 7PM-7AM, please contact night-coverage [email or web address] Password [facility]  03/15/2017, 4:07 PM

## 2017-03-15 NOTE — ED Provider Notes (Signed)
[facility] PROGRESSIVE CARE Provider Note   CSN: [id number] Arrival date & time: 03/15/17  1230     History   Chief Complaint Chief Complaint  Patient presents with  . Extremity Weakness    left arm    HPI [name] is a 84 y.o. female.  HPI Patient states she was sitting at a computer paying bills when she stood up.  She became lightheaded and had left arm numbness and blurred vision in her left eye.  This occurred at 11:30 AM today lasting roughly 15 minutes.  She currently is asymptomatic.  Denies chest pain or shortness of breath at any point.  Denies focal weakness at any point.  Patient does have history of atrial fibrillation for which she takes Coumadin. Past Medical History:  Diagnosis Date  . Anemia   . Atrial fibrillation (HCC)    Paroxysmal, Flecainide therapy  . Calf pain    September, 2012, at rest  . Cardiomegaly    EF normal, echo, 2003  //   EF 60-65%, echo, June 22, 4095, diastolic dysfunction,  . Carotid bruit    Doppler, December, 2009, no abnormality  . Cataract   . Diverticulosis   . GERD (gastroesophageal reflux disease)   . History of shingles   . HLD (hyperlipidemia)   . HTN (hypertension)   . Hypothyroidism   . Hypothyroidism   . Insomnia   . Normal nuclear stress test    Normal, 2001  . Osteoarthritis   . PONV (postoperative nausea and vomiting)   . Rectal bleeding 2001   diverticulosis and int hemorrhoids on 07/1999 and 02/2010 colonoscopies.  . Stroke ([place])   . Warfarin anticoagulation     Patient Active Problem List   Diagnosis Date Noted  . TIA (transient ischemic attack) 03/15/2017  . Osteoporosis 02/24/2017  . Supratherapeutic INR 01/28/2017  . Prolonged QT interval 01/28/2017  . Abnormal nuclear cardiac imaging test   . Unstable angina ([place]) 06/16/2016  . Atrial flutter ([place]) 06/16/2016  . Abnormal stress test 06/16/2016  . Frequent urination 05/10/2016  . Acute cystitis with hematuria 05/10/2016  . Chest pain  04/23/2016  . Hypomagnesemia 04/23/2016  . AP (abdominal pain)   . Melena 09/23/2015  . Overactive bladder 05/02/2015  . Acute cerebrovascular accident (CVA) ([place]) 04/21/2015  . Acute cardioembolic stroke ([place]) 35/32/9924  . Healthcare maintenance 02/20/2015  . History of dysarthria 02/20/2015  . Enteritis due to Clostridium difficile 01/27/2015  . HTN (hypertension) 01/11/2015  . Hyponatremia 01/11/2015  . Rectal bleeding 01/11/2015  . Chronic pain syndrome 11/25/2014  . Insomnia 11/25/2014  . Chronic ulcer of toe ([place]) 11/13/2014  . Chronic diastolic CHF (congestive heart failure) ([place]) 11/12/2014  . Paroxysmal atrial fibrillation ([place]) 10/14/2014  . Warfarin anticoagulation   . Hypothyroidism   . Carotid bruit   . Normal nuclear stress test   . S/P knee replacement   . UNSPECIFIED ANEMIA 01/19/2010  . DIVERTICULOSIS-COLON 01/19/2010    Past Surgical History:  Procedure Laterality Date  . BIOPSY  09/29/2015   Procedure: BIOPSY;  Surgeon: Rogene Houston, MD;  [facility];  Service: Endoscopy;;  gastric  . CATARACT EXTRACTION W/PHACO Right 04/15/2014   Procedure: CATARACT EXTRACTION PHACO AND INTRAOCULAR LENS PLACEMENT (IOC);  Surgeon: Tonny Branch, MD;  Location: [facility] OR[facility];  Service: Ophthalmology;  Laterality: Right;  CDE: 13.30  . CATARACT EXTRACTION W/PHACO Left 05/13/2014   Procedure: CATARACT EXTRACTION PHACO AND INTRAOCULAR LENS PLACEMENT LEFT EYE;  Surgeon: Levada Dy  Geoffry Paradise, MD;  Location: [facility];  Service: Ophthalmology;  Laterality: Left;  CDE:13.00  . CHOLECYSTECTOMY N/A 01/15/2015   Procedure: LAPAROSCOPIC CHOLECYSTECTOMY;  Surgeon: Ralene Ok, MD;  Location: [facility];  Service: General;  Laterality: N/A;  . COLONOSCOPY N/A 09/29/2015   Procedure: COLONOSCOPY;  Surgeon: Rogene Houston, MD;  [facility];  Service: Endoscopy;  Laterality: N/A;  . COLONOSCOPY    . ESOPHAGOGASTRODUODENOSCOPY N/A 01/13/2015   Procedure: ESOPHAGOGASTRODUODENOSCOPY (EGD);   Surgeon: Jerene Bears, MD;  Location[facility] ENDOSCOPY;  Service: Endoscopy;  Laterality: N/A;  . ESOPHAGOGASTRODUODENOSCOPY N/A 09/29/2015   Procedure: ESOPHAGOGASTRODUODENOSCOPY (EGD);  Surgeon: Rogene Houston, MD;  [facility];  Service: Endoscopy;  Laterality: N/A;  12:15  . GALLBLADDER SURGERY    . LEFT HEART CATH AND CORONARY ANGIOGRAPHY N/A 06/22/2016   Procedure: Left Heart Cath and Coronary Angiography;  Surgeon: Troy Sine, MD;  Location: [facility] CV LAB;  Service: Cardiovascular;  Laterality: N/A;  . SHOULDER OPEN ROTATOR CUFF REPAIR     bilateral  . TOTAL KNEE ARTHROPLASTY  05/26/10   right  . TOTAL KNEE ARTHROPLASTY  11/17/2010   Procedure: TOTAL KNEE ARTHROPLASTY;  Surgeon: Mauri Pole;  Location: [facility] ORS;  Service: Orthopedics;  Laterality: Left;  . TOTAL KNEE ARTHROPLASTY     Right  . TOTAL SHOULDER REPLACEMENT  01/2010   left    OB History    Gravida Para Term Preterm AB Living   1 1 1          SAB TAB Ectopic Multiple Live Births                   Home Medications    Prior to Admission medications   Medication Sig Start Date End Date Taking? Authorizing Provider  Acetaminophen (TYLENOL) 325 MG CAPS Take 650 mg by mouth every 6 (six) hours as needed for mild pain.   Yes [provider]  ARTIFICIAL TEAR OP Apply 2 drops to eye daily as needed ([place]). To the left eye   Yes [provider]  atenolol (TENORMIN) 50 MG tablet Take 1 tablet (50 mg total) by mouth daily. Take 1 tablet (50mg ) once daily Patient taking differently: Take 25 mg by mouth 2 (two) times daily. Take 1 tablet (50mg ) once daily 01/29/17  Yes Emokpae, Courage, MD  atorvastatin (LIPITOR) 20 MG tablet TAKE 1 TABLET BY MOUTH AT  BEDTIME 10/08/16  Yes Timmothy Euler, MD  flecainide (TAMBOCOR) 50 MG tablet Take 1 tablet (50 mg total) by mouth 2 (two) times daily. Patient taking differently: Take 25 mg by mouth 2 (two) times daily.  01/28/17  Yes Emokpae, Courage, MD    furosemide (LASIX) 40 MG tablet TAKE 1/2 TABLET BY MOUTH  EVERY MORNING , THEN TAKE 1 TABLET BY MOUTH IF SWELLING OCCURS 02/22/17  Yes Timmothy Euler, MD  irbesartan (AVAPRO) 150 MG tablet Take 1 tablet (150 mg total) by mouth daily. 03/10/17  Yes Timmothy Euler, MD  levETIRAcetam (KEPPRA) 250 MG tablet Take 1 tablet (250 mg total) by mouth 2 (two) times daily. Patient taking differently: Take 125 mg by mouth 2 (two) times daily.  02/24/17  Yes Timmothy Euler, MD  levothyroxine (SYNTHROID, LEVOTHROID) 50 MCG tablet Take 50 mcg by mouth daily before breakfast.  06/03/16  Yes [provider]  mirtazapine (REMERON) 15 MG tablet TAKE ONE TABLET AT BEDTIME 02/17/17  Yes Timmothy Euler, MD  pantoprazole (PROTONIX) 40 MG  tablet TAKE 1 TABLET DAILY 01/11/17  Yes Timmothy Euler, MD  potassium chloride SA (K-DUR,KLOR-CON) 20 MEQ tablet Take 10 mEq by mouth 2 (two) times daily.    Yes [provider]  warfarin (COUMADIN) 5 MG tablet Take 1-2 tablets (5-10 mg total) by mouth daily. Patient taking differently: Take 5 mg by mouth daily.  02/24/17  Yes Timmothy Euler, MD    Family History Family History  Problem Relation Age of Onset  . COPD Brother   . Atrial fibrillation Brother   . Diabetes Brother   . Liver cancer Brother        Alcohol-related  . Colon cancer Brother   . Heart attack Father   . Early death Father   . Rheum arthritis Mother   . Arthritis Mother   . Heart disease Mother   . Coronary artery disease Brother   . Heart attack Daughter   . Gallbladder disease Brother     Social History Social History   Tobacco Use  . Smoking status: Never Smoker  . Smokeless tobacco: Never Used  Substance Use Topics  . Alcohol use: No  . Drug use: No     Allergies   Penicillins   Review of Systems Review of Systems  Constitutional: Negative for chills and fever.  HENT: Negative for facial swelling and trouble swallowing.   Eyes: Positive for visual  disturbance. Negative for pain.  Respiratory: Negative for cough and shortness of breath.   Cardiovascular: Negative for chest pain, palpitations and leg swelling.  Gastrointestinal: Negative for abdominal pain, diarrhea, nausea and vomiting.  Genitourinary: Negative for dysuria, flank pain and frequency.  Musculoskeletal: Negative for back pain, myalgias, neck pain and neck stiffness.  Skin: Negative for rash and wound.  Neurological: Positive for dizziness, light-headedness and numbness. Negative for syncope, weakness and headaches.  All other systems reviewed and are negative.    Physical Exam Updated Vital Signs BP (!) 136/44 (BP Location: Right Arm)   Pulse (!) 36   Temp 97.8 F (36.6 C) (Oral)   Resp 20   Ht 5\' 2"  (1.575 m)   Wt 61.2 kg (134 lb 14.7 oz)   SpO2 100%   BMI 24.68 kg/m   Physical Exam  Constitutional: She is oriented to person, place, and time. She appears well-developed and well-nourished. No distress.  HENT:  Head: Normocephalic and atraumatic.  Mouth/Throat: Oropharynx is clear and moist. No oropharyngeal exudate.  Eyes: EOM are normal. Pupils are equal, round, and reactive to light.  Neck: Normal range of motion. Neck supple. No JVD present.  Cardiovascular:  Irregularly irregular  Pulmonary/Chest: Effort normal and breath sounds normal. No stridor. No respiratory distress. She has no wheezes. She has no rales. She exhibits no tenderness.  Abdominal: Soft. Bowel sounds are normal. There is no tenderness. There is no rebound and no guarding.  Musculoskeletal: Normal range of motion. She exhibits no edema or tenderness.  Lymphadenopathy:    She has no cervical adenopathy.  Neurological: She is alert and oriented to person, place, and time.  Patient is alert and oriented x3 with clear, goal oriented speech.  Cranial nerves II through XII grossly intact.  Patient has 5/5 motor in all extremities. Sensation is intact to light touch. Bilateral finger-to-nose  is normal with no signs of dysmetria.   Skin: Skin is warm and dry. Capillary refill takes less than 2 seconds. No rash noted. She is not diaphoretic. No erythema.  Psychiatric: She has a normal mood  and affect. Her behavior is normal.  Nursing note and vitals reviewed.    ED Treatments / Results  Labs (all labs ordered are listed, but only abnormal results are displayed) Labs Reviewed  PROTIME-INR - Abnormal; Notable for the following components:      Result Value   Prothrombin Time 21.0 (*)    All other components within normal limits  APTT - Abnormal; Notable for the following components:   aPTT 41 (*)    All other components within normal limits  COMPREHENSIVE METABOLIC PANEL - Abnormal; Notable for the following components:   Sodium 131 (*)    Chloride 93 (*)    Calcium 8.6 (*)    ALT 11 (*)    All other components within normal limits  BASIC METABOLIC PANEL - Abnormal; Notable for the following components:   Sodium 134 (*)    Chloride 98 (*)    Glucose, Bld 100 (*)    Calcium 8.4 (*)    All other components within normal limits  CBC  DIFFERENTIAL  URINALYSIS, ROUTINE W REFLEX MICROSCOPIC  LIPID PANEL  HEMOGLOBIN A1C  I-STAT TROPONIN, ED    EKG  EKG Interpretation  Date/Time:  Tuesday March 15 2017 12:48:40 EDT Ventricular Rate:  60 PR Interval:    QRS Duration: 105 QT Interval:  425 QTC Calculation: 425 R Axis:   -54 Text Interpretation:  Sinus or ectopic atrial rhythm Ventricular bigeminy Prolonged PR interval LAD, consider left anterior fascicular block Anteroseptal infarct, old Confirmed by Julianne Rice [id number]) on 03/15/2017 1:28:35 PM       Radiology Dg Chest 2 View  Result Date: 03/15/2017 CLINICAL DATA:  Initial evaluation for acute TIA, left vision loss. EXAM: CHEST - 2 VIEW COMPARISON:  Prior radiograph from 01/28/2017. FINDINGS: Cardiac and mediastinal silhouettes are stable in size and contour, and remain within normal limits. Aortic  atherosclerosis. Lungs hyperinflated with attenuation of the pulmonary markings, consistent with emphysema. No focal infiltrates or edema. Blunting of the left costophrenic angle may reflect a trace left pleural effusion. No pneumothorax. Subcentimeter calcified granuloma the noted at the peripheral right midlung. Left shoulder arthroplasty partially visualized. No acute osseous abnormality. IMPRESSION: 1. Blunting of the left costophrenic angle, suggestive of small left pleural effusion. 2. No other active cardiopulmonary disease. 3. Emphysema. 4. Aortic atherosclerosis. Electronically Signed   By: Jeannine Boga M.D.   On: 03/15/2017 17:17   Ct Head Wo Contrast  Result Date: 03/15/2017 CLINICAL DATA:  LEFT vision changes, LEFT arm numbness for 30 minutes beginning at 11:30 a.m. Assess TIA. History of hypertension, hyperlipidemia and stroke. EXAM: CT HEAD WITHOUT CONTRAST TECHNIQUE: Contiguous axial images were obtained from the base of the skull through the vertex without intravenous contrast. COMPARISON:  MRI of the head December 06, 2016 FINDINGS: BRAIN: No intraparenchymal hemorrhage, mass effect nor midline shift. The ventricles and sulci are normal for age. Mild patchy to confluent supratentorial white matter hypodensities. No acute large vascular territory infarcts. No abnormal extra-axial fluid collections. Basal cisterns are patent. VASCULAR: Mild to moderate calcific atherosclerosis of the carotid siphons. SKULL: No skull fracture. Osteopenia. Moderate temporomandibular osteoarthrosis. No significant scalp soft tissue swelling. SINUSES/ORBITS: Trace LEFT mastoid effusion.The included ocular globes and orbital contents are non-suspicious. Status post bilateral ocular lens implants. Partially imaged potential LEFT dacryocyst. OTHER: None. IMPRESSION: 1. No acute intracranial process. 2. Stable moderate to severe chronic small vessel ischemic disease. Electronically Signed   By: Elon Alas  M.D.   On: 03/15/2017 14:24  Mr Brain Wo Contrast  Result Date: 03/15/2017 CLINICAL DATA:  Focal neuro deficit greater than 6 hours. Suspect stroke. Left vision changes. Left arm numbness. EXAM: MRI HEAD WITHOUT CONTRAST TECHNIQUE: Multiplanar, multiecho pulse sequences of the brain and surrounding structures were obtained without intravenous contrast. COMPARISON:  CT 03/15/2017.  MRI 12/06/2016 FINDINGS: Brain: Negative for acute infarct. Patchy hyperintensity throughout the cerebral white matter bilaterally unchanged from the prior MRI. Negative for hemorrhage or mass. Negative for hydrocephalus. Mild atrophy. Vascular: Normal arterial flow voids. Skull and upper cervical spine: Negative. Sinuses/Orbits: Bilateral cataract removal.  Paranasal sinuses clear Other: None IMPRESSION: No acute abnormality Atrophy and moderate chronic microvascular ischemic changes stable from the prior MRI. Electronically Signed   By: Franchot Gallo M.D.   On: 03/15/2017 17:35   US Carotid Bilateral (at [facility] And [facility] Only)  Result Date: 03/15/2017 CLINICAL DATA:  84 year old female with a history of visual disturbance. Cardiovascular risk factors include hypertension, stroke/TIA, hyperlipidemia EXAM: BILATERAL CAROTID DUPLEX ULTRASOUND TECHNIQUE: Pearline Cables scale imaging, color Doppler and duplex ultrasound were performed of bilateral carotid and vertebral arteries in the neck. COMPARISON:  No prior duplex FINDINGS: Criteria: Quantification of carotid stenosis is based on velocity parameters that correlate the residual internal carotid diameter with NASCET-based stenosis levels, using the diameter of the distal internal carotid lumen as the denominator for stenosis measurement. The following velocity measurements were obtained: RIGHT ICA:  Systolic 83 cm/sec, Diastolic 19 cm/sec CCA:  60 cm/sec SYSTOLIC ICA/CCA RATIO:  1.4 ECA:  47 cm/sec LEFT ICA:  Systolic 96 cm/sec, Diastolic 24 cm/sec CCA:  70 cm/sec SYSTOLIC ICA/CCA RATIO:  1.4  ECA:  63 cm/sec Right Brachial SBP: Not acquired Left Brachial SBP: Not acquired RIGHT CAROTID ARTERY: No significant calcifications of the right common carotid artery. Intermediate waveform maintained. Heterogeneous and partially calcified plaque at the right carotid bifurcation. No significant lumen shadowing. Low resistance waveform of the right ICA. Tortuosity. RIGHT VERTEBRAL ARTERY: Antegrade flow with low resistance waveform. LEFT CAROTID ARTERY: No significant calcifications of the left common carotid artery. Intermediate waveform maintained. Heterogeneous and partially calcified plaque at the left carotid bifurcation without significant lumen shadowing. Low resistance waveform of the left ICA. Tortuosity. LEFT VERTEBRAL ARTERY:  Antegrade flow with low resistance waveform. IMPRESSION: Color duplex indicates moderate heterogeneous and calcified plaque, with no hemodynamically significant stenosis by duplex criteria in the extracranial cerebrovascular circulation. Signed, Dulcy Fanny. Earleen Newport, DO [facility] Radiology Electronically Signed   By: Corrie Mckusick D.O.   On: 03/15/2017 17:05    Procedures Procedures (including critical care time)  Medications Ordered in ED Medications  levothyroxine (SYNTHROID, LEVOTHROID) tablet 50 mcg (50 mcg Oral Given 03/16/17 0825)  atorvastatin (LIPITOR) tablet 20 mg (20 mg Oral Given 03/15/17 2249)  pantoprazole (PROTONIX) EC tablet 40 mg (40 mg Oral Given 03/16/17 0825)  mirtazapine (REMERON) tablet 15 mg (15 mg Oral Not Given 03/15/17 2232)  acetaminophen (TYLENOL) tablet 650 mg (650 mg Oral Given 03/16/17 0105)    Or  acetaminophen (TYLENOL) solution 650 mg ( Per Tube See Alternative 03/16/17 0105)    Or  acetaminophen (TYLENOL) suppository 650 mg ( Rectal See Alternative 03/16/17 0105)  aspirin suppository 300 mg ( Rectal See Alternative 03/16/17 0825)    Or  aspirin tablet 325 mg (325 mg Oral Given 03/16/17 0825)    Warfarin - Pharmacist Dosing Inpatient (not administered)  warfarin (COUMADIN) tablet 5 mg (not administered)  levETIRAcetam (KEPPRA) 100 MG/ML solution 130 mg (not administered)  stroke: mapping our early stages of recovery book ( Does not apply Given 03/16/17 0825)  warfarin (COUMADIN) tablet 5 mg (5 mg Oral Given 03/15/17 1832)     Initial Impression / Assessment and Plan / ED Course  I have reviewed the triage vital signs and the nursing notes.  Pertinent labs & imaging results that were available during my care of the patient were reviewed by me and considered in my medical decision making (see chart for details).    CT head without acute findings.  INR is subtherapeutic.  Discussed with hospitalist who will see patient in the emergency department.   Final Clinical Impressions(s) / ED Diagnoses   Final diagnoses:  TIA (transient ischemic attack)  TIA (transient ischemic attack)    ED Discharge Orders    None       Julianne Rice, MD 03/16/17 1524

## 2017-03-15 NOTE — ED Notes (Signed)
Pt given pack of nabs and gingerale per request.

## 2017-03-15 NOTE — Progress Notes (Signed)
ANTICOAGULATION CONSULT NOTE - Initial Consult  Pharmacy Consult for Coumadin (home med) Indication: atrial fibrillation  Allergies  Allergen Reactions  . Penicillins Rash and Other (See Comments)    Has patient had a PCN reaction causing immediate rash, facial/tongue/throat swelling, SOB or lightheadedness with hypotension: No Has patient had a PCN reaction causing severe rash involving mucus membranes or skin necrosis: No Has patient had a PCN reaction that required hospitalization No Has patient had a PCN reaction occurring within the last 10 years: No If all of the above answers are "NO", then may proceed with Cephalosporin use.    Patient Measurements: Height: 5' 2.5" (158.8 cm) Weight: 135 lb (61.2 kg) IBW/kg (Calculated) : 51.25  Vital Signs: Temp: 98.5 F (36.9 C) (03/12 1236) Temp Source: Oral (03/12 1236) BP: 132/82 (03/12 1500) Pulse Rate: 41 (03/12 1400)  Labs: Recent Labs    03/15/17 1320  HGB 12.0  HCT 37.6  PLT 243  APTT 41*  LABPROT 21.0*  INR 1.83  CREATININE 0.83    Estimated Creatinine Clearance: 41.6 mL/min (by C-G formula based on SCr of 0.83 mg/dL).   Medical History: Past Medical History:  Diagnosis Date  . Anemia   . Atrial fibrillation (HCC)    Paroxysmal, Flecainide therapy  . Calf pain    September, 2012, at rest  . Cardiomegaly    EF normal, echo, 2003  //   EF 60-65%, echo, June 21, 4560, diastolic dysfunction,  . Carotid bruit    Doppler, December, 2009, no abnormality  . Cataract   . Diverticulosis   . GERD (gastroesophageal reflux disease)   . History of shingles   . HLD (hyperlipidemia)   . HTN (hypertension)   . Hypothyroidism   . Hypothyroidism   . Insomnia   . Normal nuclear stress test    Normal, 2001  . Osteoarthritis   . PONV (postoperative nausea and vomiting)   . Rectal bleeding 2001   diverticulosis and int hemorrhoids on 07/1999 and 02/2010 colonoscopies.  . Stroke ([place])   . Warfarin anticoagulation      Medications:   (Not in a hospital admission)  Assessment: 84yo female on Coumadin PTA. INR is below goal on admission.  Pt reportedly took 5mg  dose earlier today.  Will give extra dose due to low INR.   Goal of Therapy:  INR 2-3 Monitor platelets by anticoagulation protocol: Yes   Plan:  Coumadin 5mg  x 1 now (extra dose due to low INR) Check INR daily  Hart Robinsons A 03/15/2017,4:20 PM

## 2017-03-16 ENCOUNTER — Observation Stay (HOSPITAL_BASED_OUTPATIENT_CLINIC_OR_DEPARTMENT_OTHER): Payer: Medicare Other

## 2017-03-16 ENCOUNTER — Observation Stay (HOSPITAL_COMMUNITY): Payer: Medicare Other

## 2017-03-16 DIAGNOSIS — I5032 Chronic diastolic (congestive) heart failure: Secondary | ICD-10-CM | POA: Diagnosis not present

## 2017-03-16 DIAGNOSIS — I480 Paroxysmal atrial fibrillation: Secondary | ICD-10-CM

## 2017-03-16 DIAGNOSIS — G894 Chronic pain syndrome: Secondary | ICD-10-CM | POA: Diagnosis not present

## 2017-03-16 DIAGNOSIS — G459 Transient cerebral ischemic attack, unspecified: Secondary | ICD-10-CM | POA: Diagnosis not present

## 2017-03-16 DIAGNOSIS — H547 Unspecified visual loss: Secondary | ICD-10-CM | POA: Diagnosis not present

## 2017-03-16 DIAGNOSIS — I10 Essential (primary) hypertension: Secondary | ICD-10-CM

## 2017-03-16 DIAGNOSIS — Z7901 Long term (current) use of anticoagulants: Secondary | ICD-10-CM | POA: Diagnosis not present

## 2017-03-16 LAB — BASIC METABOLIC PANEL
ANION GAP: 10 (ref 5–15)
Anion gap: 10 (ref 5–15)
BUN: 12 mg/dL (ref 6–20)
BUN: 12 mg/dL (ref 6–20)
CHLORIDE: 98 mmol/L — AB (ref 101–111)
CHLORIDE: 99 mmol/L — AB (ref 101–111)
CO2: 26 mmol/L (ref 22–32)
CO2: 25 mmol/L (ref 22–32)
Calcium: 8.4 mg/dL — ABNORMAL LOW (ref 8.9–10.3)
Calcium: 8.5 mg/dL — ABNORMAL LOW (ref 8.9–10.3)
Creatinine, Ser: 0.84 mg/dL (ref 0.44–1.00)
Creatinine, Ser: 0.77 mg/dL (ref 0.44–1.00)
GFR calc Af Amer: >60 mL/min (ref >60)
GFR calc Af Amer: >60 mL/min (ref >60)
GFR calc non Af Amer: >60 mL/min (ref >60)
GFR calc non Af Amer: >60 mL/min (ref >60)
GLUCOSE: 100 mg/dL — AB (ref 65–99)
GLUCOSE: 105 mg/dL — AB (ref 65–99)
POTASSIUM: 3.9 mmol/L (ref 3.5–5.1)
POTASSIUM: 4.2 mmol/L (ref 3.5–5.1)
SODIUM: 134 mmol/L — AB (ref 135–145)
Sodium: 134 mmol/L — ABNORMAL LOW (ref 135–145)

## 2017-03-16 LAB — PROTIME-INR
INR: 2.82
PROTHROMBIN TIME: 29.5 s — AB (ref 11.4–15.2)

## 2017-03-16 LAB — MAGNESIUM: Magnesium: 2.1 mg/dL (ref 1.7–2.4)

## 2017-03-16 LAB — ECHOCARDIOGRAM COMPLETE
Height: 62 in
Weight: 2158.74 oz

## 2017-03-16 LAB — LIPASE, BLOOD: Lipase: 26 U/L (ref 11–51)

## 2017-03-16 LAB — HEMOGLOBIN A1C
HEMOGLOBIN A1C: 5.3 % (ref 4.8–5.6)
Mean Plasma Glucose: 105.41 mg/dL

## 2017-03-16 MED ORDER — WARFARIN SODIUM 5 MG PO TABS
5.0000 mg | ORAL_TABLET | Freq: Once | ORAL | Status: AC
  Administered 2017-03-16: 5 mg via ORAL
  Filled 2017-03-16: qty 1

## 2017-03-16 MED ORDER — SODIUM CHLORIDE 0.9 % IV SOLN
INTRAVENOUS | Status: DC
  Administered 2017-03-16: 18:00:00 via INTRAVENOUS

## 2017-03-16 MED ORDER — LEVETIRACETAM 100 MG/ML PO SOLN
125.0000 mg | Freq: Two times a day (BID) | ORAL | Status: DC
  Administered 2017-03-16 – 2017-03-17 (×2): 130 mg via ORAL
  Filled 2017-03-16 (×2): qty 5

## 2017-03-16 MED ORDER — POTASSIUM CHLORIDE CRYS ER 20 MEQ PO TBCR
20.0000 meq | EXTENDED_RELEASE_TABLET | Freq: Every day | ORAL | Status: DC
  Administered 2017-03-16 – 2017-03-17 (×2): 20 meq via ORAL
  Filled 2017-03-16 (×2): qty 1

## 2017-03-16 NOTE — Progress Notes (Signed)
  Echocardiogram 2D Echocardiogram has been performed.  Janiaya Ryser L Androw 03/16/2017, 1:04 PM

## 2017-03-16 NOTE — Progress Notes (Signed)
Pt arrived via EMS. Able to ambulate to bed.  Alert and oriented x 4. Accompanied by son.  Oriented to room and equipment. Declined safety video at this time. Passed swallow eval at [facility], diet orders changed to heart healthy. Denies any pain at this time. No distress noted. Will monitor and treat per orders.

## 2017-03-16 NOTE — Evaluation (Signed)
Speech Language Pathology Evaluation Patient Details Name: [name] MRN: [id number] DOB: 01/11/1932 Today's Date: 03/16/2017 Time: 8657-8469 SLP Time Calculation (min) (ACUTE ONLY): 24 min  Problem List:  Patient Active Problem List   Diagnosis Date Noted  . TIA (transient ischemic attack) 03/15/2017  . Osteoporosis 02/24/2017  . Supratherapeutic INR 01/28/2017  . Prolonged QT interval 01/28/2017  . Abnormal nuclear cardiac imaging test   . Unstable angina ([place]) 06/16/2016  . Atrial flutter ([place]) 06/16/2016  . Abnormal stress test 06/16/2016  . Frequent urination 05/10/2016  . Acute cystitis with hematuria 05/10/2016  . Chest pain 04/23/2016  . Hypomagnesemia 04/23/2016  . AP (abdominal pain)   . Melena 09/23/2015  . Overactive bladder 05/02/2015  . Acute cerebrovascular accident (CVA) ([place]) 04/21/2015  . Acute cardioembolic stroke ([place]) 62/95/2841  . Healthcare maintenance 02/20/2015  . History of dysarthria 02/20/2015  . Enteritis due to Clostridium difficile 01/27/2015  . HTN (hypertension) 01/11/2015  . Hyponatremia 01/11/2015  . Rectal bleeding 01/11/2015  . Chronic pain syndrome 11/25/2014  . Insomnia 11/25/2014  . Chronic ulcer of toe ([place]) 11/13/2014  . Chronic diastolic CHF (congestive heart failure) ([place]) 11/12/2014  . Paroxysmal atrial fibrillation ([place]) 10/14/2014  . Warfarin anticoagulation   . Hypothyroidism   . Carotid bruit   . Normal nuclear stress test   . S/P knee replacement   . UNSPECIFIED ANEMIA 01/19/2010  . DIVERTICULOSIS-COLON 01/19/2010   Past Medical History:  Past Medical History:  Diagnosis Date  . Anemia   . Atrial fibrillation (HCC)    Paroxysmal, Flecainide therapy  . Calf pain    September, 2012, at rest  . Cardiomegaly    EF normal, echo, 2003  //   EF 60-65%, echo, June 22, 3242, diastolic dysfunction,  . Carotid bruit    Doppler, December, 2009, no abnormality  . Cataract   . Diverticulosis   . GERD (gastroesophageal  reflux disease)   . History of shingles   . HLD (hyperlipidemia)   . HTN (hypertension)   . Hypothyroidism   . Hypothyroidism   . Insomnia   . Normal nuclear stress test    Normal, 2001  . Osteoarthritis   . PONV (postoperative nausea and vomiting)   . Rectal bleeding 2001   diverticulosis and int hemorrhoids on 07/1999 and 02/2010 colonoscopies.  . Stroke ([place])   . Warfarin anticoagulation    Past Surgical History:  Past Surgical History:  Procedure Laterality Date  . BIOPSY  09/29/2015   Procedure: BIOPSY;  Surgeon: Rogene Houston, MD;  [facility];  Service: Endoscopy;;  gastric  . CATARACT EXTRACTION W/PHACO Right 04/15/2014   Procedure: CATARACT EXTRACTION PHACO AND INTRAOCULAR LENS PLACEMENT (IOC);  Surgeon: Tonny Branch, MD;  Location: [facility];  Service: Ophthalmology;  Laterality: Right;  CDE: 13.30  . CATARACT EXTRACTION W/PHACO Left 05/13/2014   Procedure: CATARACT EXTRACTION PHACO AND INTRAOCULAR LENS PLACEMENT LEFT EYE;  Surgeon: Tonny Branch, MD;  Location: [facility];  Service: Ophthalmology;  Laterality: Left;  CDE:13.00  . CHOLECYSTECTOMY N/A 01/15/2015   Procedure: LAPAROSCOPIC CHOLECYSTECTOMY;  Surgeon: Ralene Ok, MD;  Location: [facility];  Service: General;  Laterality: N/A;  . COLONOSCOPY N/A 09/29/2015   Procedure: COLONOSCOPY;  Surgeon: Rogene Houston, MD;  [facility];  Service: Endoscopy;  Laterality: N/A;  . COLONOSCOPY    . ESOPHAGOGASTRODUODENOSCOPY N/A 01/13/2015   Procedure: ESOPHAGOGASTRODUODENOSCOPY (EGD);  Surgeon: Jerene Bears, MD;  Location: [facility] ENDOSCOPY;  Service: Endoscopy;  Laterality: N/A;  .  ESOPHAGOGASTRODUODENOSCOPY N/A 09/29/2015   Procedure: ESOPHAGOGASTRODUODENOSCOPY (EGD);  Surgeon: Rogene Houston, MD;  [facility];  Service: Endoscopy;  Laterality: N/A;  12:15  . GALLBLADDER SURGERY    . LEFT HEART CATH AND CORONARY ANGIOGRAPHY N/A 06/22/2016   Procedure: Left Heart Cath and Coronary Angiography;  Surgeon: Troy Sine, MD;  Location: [facility];  Service: Cardiovascular;  Laterality: N/A;  . SHOULDER OPEN ROTATOR CUFF REPAIR     bilateral  . TOTAL KNEE ARTHROPLASTY  05/26/10   right  . TOTAL KNEE ARTHROPLASTY  11/17/2010   Procedure: TOTAL KNEE ARTHROPLASTY;  Surgeon: Mauri Pole;  Location: [facility];  Service: Orthopedics;  Laterality: Left;  . TOTAL KNEE ARTHROPLASTY     Right  . TOTAL SHOULDER REPLACEMENT  01/2010   left   HPI:  84 yo female resident of [place] transfered to [facility] with left eye vision loss/impairment.  Pt MRI showed atrophy, nothing acute.  Speech evaluation ordered as part of stroke work up.    Assessment / Plan / Recommendation Clinical Impression  MOCA 7.3 *non-visual portions* administered.  Pt scored 16/25 indicative of cognitive lingusitic deficits.  Strengths included in areas of language, fluency, abstract though and attention.  Deficits noted in word recall with decreased storage and retrieval of information, orientation to year and date and math skills.  Pt's spouse reports pt has written the incorrect dates on a check prior to admission - therefore suspect some baseline dysfunction.  Pt's speech and languge are fluent - with negative cranial nerve exam.  SlP provided written compensation strategies and advised pt's spouse Thayer Jew to assure pt is managing her home duties, bills, medications, etc safely.  No SLP follow up needed as pt educated to findings/compensations using teach back.  Thanks for this referral.      SLP Assessment  SLP Recommendation/Assessment: Patient does not need any further Speech Lanaguage Pathology Services SLP Visit Diagnosis: Cognitive communication deficit (R41.841)    Follow Up Recommendations  None    Frequency and Duration      n/a     SLP Evaluation Cognition  Overall Cognitive Status: (pt admits to some "memory deficits" prior to admission) Arousal/Alertness: Awake/alert Orientation Level: Oriented to person;Oriented to  place;Oriented to situation Attention: Sustained;Selective Sustained Attention: Appears intact Selective Attention: Appears intact Memory: Impaired Memory Impairment: Storage deficit;Retrieval deficit(recalled two words with cues, did not recall 3 words despite multiple choice cue) Awareness: Appears intact Awareness Impairment: (admits that having spouse/sister help with medicines, etc may be helpful) Problem Solving Impairment: Verbal complex Safety/Judgment: Appears intact       Comprehension  Auditory Comprehension Overall Auditory Comprehension: Appears within functional limits for tasks assessed Yes/No Questions: Not tested Commands: Within Functional Limits Conversation: Complex Visual Recognition/Discrimination Discrimination: Within Function Limits Reading Comprehension Reading Status: Not tested(pt did not have reading glasses present)    Expression Expression Primary Mode of Expression: Verbal Verbal Expression Overall Verbal Expression: Appears within functional limits for tasks assessed Initiation: No impairment Repetition: No impairment Naming: No impairment Pragmatics: No impairment Written Expression Dominant Hand: Right Written Expression: Not tested(due to pt's vision deficits, pt reports changes in visual acuity )   Oral / Motor  Oral Motor/Sensory Function Overall Oral Motor/Sensory Function: Within functional limits Motor Speech Overall Motor Speech: Appears within functional limits for tasks assessed Respiration: Within functional limits Resonance: Within functional limits Articulation: Within functional limitis Intelligibility: Intelligible Motor Planning: Witnin functional limits   GO  Macario Golds 03/16/2017, 10:08 AM   Luanna Salk, [facility] SLP [phone]

## 2017-03-16 NOTE — Progress Notes (Signed)
MD notified of patient experiencing symptoms of numbness and tingling in her neck that went away but went to her head. She feels fullness in her head. Patient expressed that this is how she felt yesterday before she was admitted. MD assessed patient.

## 2017-03-16 NOTE — Evaluation (Signed)
Occupational Therapy Evaluation and Discharge Patient Details Name: [name] MRN: [id number] DOB: [birth year] Today's Date: 03/16/2017    History of Present Illness Pt is an 84 y.o. female who presented to the ED with L vision loss as well as L facial and UE weakness that resolved after about 20 minutes. MRI and CT negative for acute abnormality.    Clinical Impression   PTA, pt was independent with RW for ADL and functional mobility. She is able to complete home management tasks at modified independent level and works with her husband. Pt currently presents with some memory deficits likely at baseline and is able to verbalize compensatory strategies to maximize safety. Pt able to complete ADL in hospital setting with modified independence. Pt is functioning at or close to baseline and will have 24 hour supervision from husband. No further OT services necessary. Will sign off.     Follow Up Recommendations  No OT follow up;Supervision - Intermittent    Equipment Recommendations  None recommended by OT    Recommendations for Other Services       Precautions / Restrictions Precautions Precautions: Fall Restrictions Weight Bearing Restrictions: No      Mobility Bed Mobility Overal bed mobility: Modified Independent                Transfers Overall transfer level: Modified independent Equipment used: Rolling walker (2 wheeled)             General transfer comment: Modified independent with use of RW.     Balance Overall balance assessment: Mild deficits observed, not formally tested                                         ADL either performed or assessed with clinical judgement   ADL Overall ADL's : Modified independent                                       General ADL Comments: Completing ADL with modified independence with RW this session.      Vision Baseline Vision/History: Wears glasses Wears Glasses: At all  times("most of the time") Patient Visual Report: No change from baseline(L sided vision loss yesterday has resolved. ) Vision Assessment?: Yes Eye Alignment: Within Functional Limits Ocular Range of Motion: Within Functional Limits Alignment/Gaze Preference: Within Defined Limits Tracking/Visual Pursuits: Able to track stimulus in all quads without difficulty Saccades: Within functional limits Visual Fields: No apparent deficits Additional Comments: Functionally able to use vision throughout session.      Perception     Praxis      Pertinent Vitals/Pain Pain Assessment: No/denies pain     Hand Dominance Right   Extremity/Trunk Assessment Upper Extremity Assessment Upper Extremity Assessment: Overall WFL for tasks assessed   Lower Extremity Assessment Lower Extremity Assessment: Overall WFL for tasks assessed       Communication Communication Communication: No difficulties   Cognition Arousal/Alertness: Awake/alert Behavior During Therapy: WFL for tasks assessed/performed Overall Cognitive Status: (pt admits to some "memory deficits" prior to admission) Area of Impairment: Memory                     Memory: Decreased short-term memory         General Comments: Decreased delayed recall. Able to verbalize multiple compensatory  strategies she uses to participate in daily tasks.    General Comments  Husband present during session. Very focused on pt returning home.     Exercises     Shoulder Instructions      Home Living Family/patient expects to be discharged to:: Private residence Living Arrangements: Spouse/significant other Available Help at Discharge: Family;Available 24 hours/day Type of Home: House Home Access: Stairs to enter [employer] of Steps: 2   Home Layout: Able to live on main level with bedroom/bathroom(has basement but walks around home to get to)     Bathroom Shower/Tub: Tub/shower unit   [employer]: Standard      Home Equipment: Environmental consultant - 2 wheels;Shower seat;Grab bars - tub/shower(walking skick)      Lives With: Spouse    Prior Functioning/Environment Level of Independence: Independent with assistive device(s)        Comments: Uses RW or walking stick for mobility.         OT Problem List: Decreased activity tolerance;Decreased cognition      OT Treatment/Interventions:      OT Goals(Current goals can be found in the care plan section) Acute Rehab OT Goals Patient Stated Goal: go home after tests OT Goal Formulation: With patient/family Time For Goal Achievement: 03/30/17 Potential to Achieve Goals: Good  OT Frequency:     Barriers to D/C:            Co-evaluation              AM-PAC PT "6 Clicks" Daily Activity     Outcome Measure Help from another person eating meals?: None Help from another person taking care of personal grooming?: None Help from another person toileting, which includes using toliet, bedpan, or urinal?: None Help from another person bathing (including washing, rinsing, drying)?: None Help from another person to put on and taking off regular upper body clothing?: None Help from another person to put on and taking off regular lower body clothing?: None 6 Click Score: 24   End of Session Equipment Utilized During Treatment: Rolling walker  Activity Tolerance: Patient tolerated treatment well Patient left: with call bell/phone within reach;in bed;with bed alarm set;with family/visitor present  OT Visit Diagnosis: Other symptoms and signs involving cognitive function                Time: 4097-3532 OT Time Calculation (min): 21 min Charges:  OT General Charges $OT Visit: 1 Visit OT Evaluation $OT Eval Moderate Complexity: 1 Mod G-Codes:     Norman Herrlich, MS OTR/L  Pager: [place] A Shaolin Armas 03/16/2017, 10:08 AM

## 2017-03-16 NOTE — Progress Notes (Signed)
Patient is experiencing diarrhea and is requesting immodium. MD was notified.

## 2017-03-16 NOTE — Progress Notes (Signed)
PT Cancellation Note  Patient Details Name: [name] MRN: [id number] DOB: April 19, 1932   Cancelled Treatment:    Reason Eval/Treat Not Completed: (P) Patient at procedure or test/unavailable Pt off floor having Echocardiogram completed. PT will follow back this afternoon as able.   Jonaven Hilgers B. Migdalia Dk PT, DPT Acute Rehabilitation  [phone] Pager [phone]     [place] 03/16/2017, 12:59 PM

## 2017-03-16 NOTE — Consult Note (Addendum)
Requesting Physician: Dr. Tyrell Antonio    Chief Complaint: Transient blurred vision and arm weakness upon standing in setting of Afib and subtherapeutic INR.   History obtained from:  Patient     HPI:                                                                                                                                         [name] is an 84 y.o. female who was in her [employer] paying her bills when upon standing she experienced left arm sensory numbness and complete loss of vision in her left eye, as though a dark shade had been drawn over it.  This occurred at 11:30 AM on 03/15/2017 and lasted for about 15 minutes. She initially stated to Hospitalist that the vision loss was blurring, and later stated to Neurology PA that it was a field cut, but on detailed questioning by Neurology attending, she clarifies that it was monocular, on the left, involved the vision of her entire eye without altitudinal or macular sparing, and came back gradually over 15 minutes. She states the arm just had sensory numbness, with no weakness. She denies facial droop, dysphagia, dysarthria, dysphasia, neck pain, headache, limb weakness or limb incoordination.   Currently she is back to baseline with no symptoms.  Apparently she was on Eliquis in the past however she did not like that medication; she has been on Coumadin for the past 6 months.  Her INR was 1.8 on arrival.  Date last known well: Date: 03/15/2017 Time last known well: Time: 11:30 tPA Given: No: out of window NIHSS 0 Modified Rankin: Rankin Score=0   Past Medical History:  Diagnosis Date  . Anemia   . Atrial fibrillation (HCC)    Paroxysmal, Flecainide therapy  . Calf pain    September, 2012, at rest  . Cardiomegaly    EF normal, echo, 2003  //   EF 60-65%, echo, June 21, 4096, diastolic dysfunction,  . Carotid bruit    Doppler, December, 2009, no abnormality  . Cataract   . Diverticulosis   . GERD (gastroesophageal reflux disease)   .  History of shingles   . HLD (hyperlipidemia)   . HTN (hypertension)   . Hypothyroidism   . Hypothyroidism   . Insomnia   . Normal nuclear stress test    Normal, 2001  . Osteoarthritis   . PONV (postoperative nausea and vomiting)   . Rectal bleeding 2001   diverticulosis and int hemorrhoids on 07/1999 and 02/2010 colonoscopies.  . Stroke ([place])   . Warfarin anticoagulation     Past Surgical History:  Procedure Laterality Date  . BIOPSY  09/29/2015   Procedure: BIOPSY;  Surgeon: Rogene Houston, MD;  [facility]: [facility];  Service: Endoscopy;;  gastric  . CATARACT EXTRACTION W/PHACO Right 04/15/2014   Procedure: CATARACT EXTRACTION PHACO AND INTRAOCULAR LENS PLACEMENT (IOC);  Surgeon: Levada Dy  Geoffry Paradise, MD;  Location: [facility];  Service: Ophthalmology;  Laterality: Right;  CDE: 13.30  . CATARACT EXTRACTION W/PHACO Left 05/13/2014   Procedure: CATARACT EXTRACTION PHACO AND INTRAOCULAR LENS PLACEMENT LEFT EYE;  Surgeon: Tonny Branch, MD;  Location: [facility];  Service: Ophthalmology;  Laterality: Left;  CDE:13.00  . CHOLECYSTECTOMY N/A 01/15/2015   Procedure: LAPAROSCOPIC CHOLECYSTECTOMY;  Surgeon: Ralene Ok, MD;  Location: [place] [facility];  Service: General;  Laterality: N/A;  . COLONOSCOPY N/A 09/29/2015   Procedure: COLONOSCOPY;  Surgeon: Rogene Houston, MD;  [facility]: [facility];  Service: Endoscopy;  Laterality: N/A;  . COLONOSCOPY    . ESOPHAGOGASTRODUODENOSCOPY N/A 01/13/2015   Procedure: ESOPHAGOGASTRODUODENOSCOPY (EGD);  Surgeon: Jerene Bears, MD;  Location: [facility];  Service: Endoscopy;  Laterality: N/A;  . ESOPHAGOGASTRODUODENOSCOPY N/A 09/29/2015   Procedure: ESOPHAGOGASTRODUODENOSCOPY (EGD);  Surgeon: Rogene Houston, MD;  [facility];  Service: Endoscopy;  Laterality: N/A;  12:15  . GALLBLADDER SURGERY    . LEFT HEART CATH AND CORONARY ANGIOGRAPHY N/A 06/22/2016   Procedure: Left Heart Cath and Coronary Angiography;  Surgeon: Troy Sine, MD;  Location: [place] [facility];   Service: Cardiovascular;  Laterality: N/A;  . SHOULDER OPEN ROTATOR CUFF REPAIR     bilateral  . TOTAL KNEE ARTHROPLASTY  05/26/10   right  . TOTAL KNEE ARTHROPLASTY  11/17/2010   Procedure: TOTAL KNEE ARTHROPLASTY;  Surgeon: Mauri Pole;  Location: [facility];  Service: Orthopedics;  Laterality: Left;  . TOTAL KNEE ARTHROPLASTY     Right  . TOTAL SHOULDER REPLACEMENT  01/2010   left    Family History  Problem Relation Age of Onset  . COPD Brother   . Atrial fibrillation Brother   . Diabetes Brother   . Liver cancer Brother        Alcohol-related  . Colon cancer Brother   . Heart attack Father   . Early death Father   . Rheum arthritis Mother   . Arthritis Mother   . Heart disease Mother   . Coronary artery disease Brother   . Heart attack Daughter   . Gallbladder disease Brother          Social History:  reports that  has never smoked. she has never used smokeless tobacco. She reports that she does not drink alcohol or use drugs.  Allergies:  Allergies  Allergen Reactions  . Penicillins Rash and Other (See Comments)    Has patient had a PCN reaction causing immediate rash, facial/tongue/throat swelling, SOB or lightheadedness with hypotension: No Has patient had a PCN reaction causing severe rash involving mucus membranes or skin necrosis: No Has patient had a PCN reaction that required hospitalization No Has patient had a PCN reaction occurring within the last 10 years: No If all of the above answers are "NO", then may proceed with Cephalosporin use.    Medications:  Prior to Admission:  Medications Prior to Admission  Medication Sig Dispense Refill Last Dose  . Acetaminophen (TYLENOL) 325 MG CAPS Take 650 mg by mouth every 6 (six) hours as needed for mild pain.   03/15/2017 at Unknown time  . ARTIFICIAL TEAR OP Apply 2 drops to eye daily as needed  (Dryness). To the left eye   03/15/2017 at Unknown time  . atenolol (TENORMIN) 50 MG tablet Take 1 tablet (50 mg total) by mouth daily. Take 1 tablet (50mg ) once daily (Patient taking differently: Take 25 mg by mouth 2 (two) times daily. Take 1 tablet (50mg ) once daily) 30 tablet 1 03/15/2017 at 0930  . atorvastatin (LIPITOR) 20 MG tablet TAKE 1 TABLET BY MOUTH AT  BEDTIME 90 tablet 1 03/14/2017 at Unknown time  . flecainide (TAMBOCOR) 50 MG tablet Take 1 tablet (50 mg total) by mouth 2 (two) times daily. (Patient taking differently: Take 25 mg by mouth 2 (two) times daily. ) 60 tablet 1 03/15/2017 at Unknown time  . furosemide (LASIX) 40 MG tablet TAKE 1/2 TABLET BY MOUTH  EVERY MORNING , THEN TAKE 1 TABLET BY MOUTH IF SWELLING OCCURS 90 tablet 1 03/15/2017 at Unknown time  . irbesartan (AVAPRO) 150 MG tablet Take 1 tablet (150 mg total) by mouth daily. 90 tablet 0 03/15/2017 at Unknown time  . levETIRAcetam (KEPPRA) 250 MG tablet Take 1 tablet (250 mg total) by mouth 2 (two) times daily. (Patient taking differently: Take 125 mg by mouth 2 (two) times daily. ) 180 tablet 0 03/15/2017 at Unknown time  . levothyroxine (SYNTHROID, LEVOTHROID) 50 MCG tablet Take 50 mcg by mouth daily before breakfast.    03/15/2017 at Unknown time  . mirtazapine (REMERON) 15 MG tablet TAKE ONE TABLET AT BEDTIME 30 tablet 5 03/14/2017 at Unknown time  . pantoprazole (PROTONIX) 40 MG tablet TAKE 1 TABLET DAILY 90 tablet 1 03/15/2017 at Unknown time  . potassium chloride SA (K-DUR,KLOR-CON) 20 MEQ tablet Take 10 mEq by mouth 2 (two) times daily.    03/15/2017 at Unknown time  . warfarin (COUMADIN) 5 MG tablet Take 1-2 tablets (5-10 mg total) by mouth daily. (Patient taking differently: Take 5 mg by mouth daily. ) 60 tablet 0 03/15/2017 at 0930   Scheduled: . aspirin  300 mg Rectal Daily   Or  . aspirin  325 mg Oral Daily  . atorvastatin  20 mg Oral QHS  . levETIRAcetam  250 mg Oral BID  . levothyroxine  50 mcg Oral QAC breakfast   . mirtazapine  15 mg Oral QHS  . pantoprazole  40 mg Oral Daily  . Warfarin - Pharmacist Dosing Inpatient   Does not apply Q24H   Continuous:   ROS:  History obtained from the patient  General ROS: negative for - chills, fatigue, fever, night sweats, weight gain or weight loss Psychological ROS: negative for - , hallucinations, memory difficulties, mood swings or  Ophthalmic ROS: positive for - blurry vision, ENT ROS: negative for - epistaxis, nasal discharge, oral lesions, sore throat, tinnitus or vertigo Respiratory ROS: negative for - cough,  shortness of breath or wheezing Cardiovascular ROS: negative for - chest pain, dyspnea on exertion,  Gastrointestinal ROS: negative for - abdominal pain, diarrhea,  nausea/vomiting or stool incontinence Genito-Urinary ROS: negative for - dysuria, hematuria, incontinence or urinary frequency/urgency Musculoskeletal ROS: negative for - joint swelling or muscular weakness Neurological ROS: as noted in HPI   General Examination:                                                                                                      Blood pressure (!) 136/44, pulse (!) 36, temperature 97.8 F (36.6 C), temperature source Oral, resp. rate 20, height 5\' 2"  (1.575 m), weight 61.2 kg (134 lb 14.7 oz), SpO2 100 %.  HEENT-  Normocephalic, no lesions, without obvious abnormality.  Normal external eye and conjunctiva.   Cardiovascular- S1-S2 audible, pulses palpable throughout   Lungs-no rhonchi or wheezing noted, no excessive working breathing.  Saturations within normal limits Abdomen- All 4 quadrants palpated and nontender Extremities- Warm, dry and intact Musculoskeletal-no joint tenderness, deformity or swelling Skin-warm and dry, no hyperpigmentation, vitiligo, or suspicious lesions  Neurological Examination Mental  Status: Alert, oriented, thought content appropriate.  Speech fluent without evidence of aphasia.  Able to follow 3 step commands without difficulty. Cranial Nerves: II: Discs flat bilaterally. Visual fields intact to all 4 quadrants of each eye tested individually. No extinction to DSS. Decreased visual acuity to reading sample in right eye relative to left.  III,IV, VI: ptosis not present, extra-ocular motions intact bilaterally, pupils equal, round, reactive to light and accommodation V,VII: smile symmetric, facial light touch sensation normal bilaterally VIII: hearing normal bilaterally IX,X: no hypophonia or hoarseness XI: bilateral shoulder shrug XII: midline tongue extension Motor: Right : Upper extremity   5/5    Left:     Upper extremity   5/5  Lower extremity   5/5     Lower extremity   5/5 Tone and bulk:normal tone throughout; no atrophy noted Sensory: Pinprick and light touch intact throughout, bilaterally Deep Tendon Reflexes: 2+ and symmetric throughout Plantars: Right: downgoing  Left: downgoing Cerebellar: Normal finger-to-nose; normal heel-to-shin test Gait: normal gait and station   Lab Results: Basic Metabolic Panel: Recent Labs  Lab 03/15/17 1320 03/16/17 0717  NA 131* 134*  K 5.0 3.9  CL 93* 98*  CO2 27 26  GLUCOSE 97 100*  BUN 16 12  CREATININE 0.83 0.84  CALCIUM 8.6* 8.4*    CBC: Recent Labs  Lab 03/15/17 1320  WBC 5.8  NEUTROABS 3.4  HGB 12.0  HCT 37.6  MCV 94.9  PLT 243    Lipid Panel: No results for input(s): CHOL, TRIG, HDL, CHOLHDL, VLDL, LDLCALC in the last 168 hours.  CBG: No results for input(s): GLUCAP in the last 168 hours.  Imaging: Dg Chest 2 View  Result Date: 03/15/2017 CLINICAL DATA:  Initial evaluation for acute TIA, left vision loss. EXAM: CHEST - 2 VIEW COMPARISON:  Prior radiograph from 01/28/2017. FINDINGS: Cardiac and mediastinal silhouettes are stable in size and contour, and remain within normal limits. Aortic  atherosclerosis. Lungs hyperinflated with attenuation of the pulmonary markings, consistent with emphysema. No focal infiltrates or edema. Blunting of the left costophrenic angle may reflect a trace left pleural effusion. No pneumothorax. Subcentimeter calcified granuloma the noted at the peripheral right midlung. Left shoulder arthroplasty partially visualized. No acute osseous abnormality. IMPRESSION: 1. Blunting of the left costophrenic angle, suggestive of small left pleural effusion. 2. No other active cardiopulmonary disease. 3. Emphysema. 4. Aortic atherosclerosis. Electronically Signed   By: Jeannine Boga M.D.   On: 03/15/2017 17:17   Ct Head Wo Contrast  Result Date: 03/15/2017 CLINICAL DATA:  LEFT vision changes, LEFT arm numbness for 30 minutes beginning at 11:30 a.m. Assess TIA. History of hypertension, hyperlipidemia and stroke. EXAM: CT HEAD WITHOUT CONTRAST TECHNIQUE: Contiguous axial images were obtained from the base of the skull through the vertex without intravenous contrast. COMPARISON:  MRI of the head December 06, 2016 FINDINGS: BRAIN: No intraparenchymal hemorrhage, mass effect nor midline shift. The ventricles and sulci are normal for age. Mild patchy to confluent supratentorial white matter hypodensities. No acute large vascular territory infarcts. No abnormal extra-axial fluid collections. Basal cisterns are patent. VASCULAR: Mild to moderate calcific atherosclerosis of the carotid siphons. SKULL: No skull fracture. Osteopenia. Moderate temporomandibular osteoarthrosis. No significant scalp soft tissue swelling. SINUSES/ORBITS: Trace LEFT mastoid effusion.The included ocular globes and orbital contents are non-suspicious. Status post bilateral ocular lens implants. Partially imaged potential LEFT dacryocyst. OTHER: None. IMPRESSION: 1. No acute intracranial process. 2. Stable moderate to severe chronic small vessel ischemic disease. Electronically Signed   By: Elon Alas  M.D.   On: 03/15/2017 14:24   Mr Brain Wo Contrast  Result Date: 03/15/2017 CLINICAL DATA:  Focal neuro deficit greater than 6 hours. Suspect stroke. Left vision changes. Left arm numbness. EXAM: MRI HEAD WITHOUT CONTRAST TECHNIQUE: Multiplanar, multiecho pulse sequences of the brain and surrounding structures were obtained without intravenous contrast. COMPARISON:  CT 03/15/2017.  MRI 12/06/2016 FINDINGS: Brain: Negative for acute infarct. Patchy hyperintensity throughout the cerebral white matter bilaterally unchanged from the prior MRI. Negative for hemorrhage or mass. Negative for hydrocephalus. Mild atrophy. Vascular: Normal arterial flow voids. Skull and upper cervical spine: Negative. Sinuses/Orbits: Bilateral cataract removal.  Paranasal sinuses clear Other: None IMPRESSION: No acute abnormality Atrophy and moderate chronic microvascular ischemic changes stable from the prior MRI. Electronically Signed   By: Franchot Gallo M.D.   On: 03/15/2017 17:35   US Carotid Bilateral (at [facility] Only)  Result Date: 03/15/2017 CLINICAL DATA:  84 year old female with a history of visual disturbance. Cardiovascular risk factors include hypertension, stroke/TIA, hyperlipidemia EXAM: BILATERAL CAROTID DUPLEX ULTRASOUND TECHNIQUE: Pearline Cables scale imaging, color Doppler and duplex ultrasound were performed of bilateral carotid and vertebral arteries in the neck. COMPARISON:  No prior duplex FINDINGS: Criteria: Quantification of carotid stenosis is based on velocity parameters that correlate the residual internal carotid diameter with NASCET-based stenosis levels, using the diameter of the distal internal carotid lumen as the denominator for stenosis measurement. The following velocity measurements were obtained: RIGHT ICA:  Systolic 83 cm/sec, Diastolic 19 cm/sec CCA:  60 cm/sec SYSTOLIC ICA/CCA RATIO:  1.4 ECA:  47 cm/sec LEFT ICA:  Systolic 96 cm/sec, Diastolic 24 cm/sec CCA:  70 cm/sec SYSTOLIC ICA/CCA RATIO:  1.4  ECA:  63 cm/sec Right Brachial SBP: Not acquired Left Brachial SBP: Not acquired RIGHT CAROTID ARTERY: No significant calcifications of the right common carotid artery. Intermediate waveform maintained. Heterogeneous and partially calcified plaque at the right carotid bifurcation. No significant lumen shadowing. Low resistance waveform of the right ICA. Tortuosity. RIGHT VERTEBRAL ARTERY: Antegrade flow with low resistance waveform. LEFT CAROTID ARTERY: No significant calcifications of the left common carotid artery. Intermediate waveform maintained. Heterogeneous and partially calcified plaque at the left carotid bifurcation without significant lumen shadowing. Low resistance waveform of the left ICA. Tortuosity. LEFT VERTEBRAL ARTERY:  Antegrade flow with low resistance waveform. IMPRESSION: Color duplex indicates moderate heterogeneous and calcified plaque, with no hemodynamically significant stenosis by duplex criteria in the extracranial cerebrovascular circulation. Signed, Dulcy Fanny. Earleen Newport, DO [facility] Radiology Electronically Signed   By: Corrie Mckusick D.O.   On: 03/15/2017 17:05    Assessment and plan discussed with with attending physician and they are in agreement.    Etta Quill PA-C [facility] [phone]  03/16/2017, 1:43 PM   Assessment: 84 y.o. female with left amaurosis fugax and left arm numbness upon standing. May have been a watershed TIA on the right with left monocular involvement due to steal phenomenon involving the left ophthalmic artery.  All symptoms have resolved at this time.  Patient has multiple risk factors for stroke.  Thus far workup has been negative.  She does have A. fib with subtherapeutic INR.  Given symptoms this likely was TIA which could have been secondary to hypoperfusion versus possible plaque.  Currently undergoing TIA/stroke workup. 1.  MRI brain: No acute abnormality Atrophy and moderate chronic  microvascular ischemic changes stable from the prior MRI 2. Carotid ultrasound: Color duplex indicates moderate heterogeneous and calcified plaque, with no hemodynamically significant stenosis by duplex criteria in the extracranial cerebrovascular circulation.  3. Stroke Risk Factors - atrial fibrillation, hyperlipidemia and hypertension  Recommendations: 1. HgbA1c, fasting lipid panel 2. MRA of the brain without contrast, TTE 3. Follow-up with stroke neurology clinic at [facility] as outpatient 4. Coumadin dosing per pharmacy. Most likely will need a slightly increased dose to achieve INR of 2-3. 5. Orthostatics 6. Continue atorvastatin 7. Cardiac telemetry 8. Outpatient ophthalmology evaluation to assess for possible other etiologies for transient monocular vision loss such as CRAO or CRVO 9. Please page stroke NP  Or  PA  Or MD from 8am -4 pm  as this patient from this time will be  followed by the stroke.   You can look them up on [email or web address]  Password [email or web address]   Electronically signed: Dr. Kerney Elbe

## 2017-03-16 NOTE — Progress Notes (Signed)
Patient not available.  Husband was in room waiting for her to return from procedures/tests?  Left forms with him and shared to page chaplain if needed and we could fill out forms while here in the hospital. Conard Novak, [place]    03/16/17 1300  Clinical Encounter Type  Visited With Family;Patient not available  Visit Type Initial;Other (Comment) (Advance Directive forms provided to husband-)  Referral From Physician  Consult/Referral To Chaplain

## 2017-03-16 NOTE — Progress Notes (Signed)
PROGRESS NOTE    [name]  LDJ:[id number] DOB: Feb 03, 1932 DOA: 03/15/2017 PCP: Timmothy Euler, MD    Brief Narrative by Dr Raelene Bott: [name] is a 84 y.o. female with medical history significant of A. fib on Coumadin, hypertension, hyperlipidemia comes in with acute onset of left vision loss that occurred earlier this morning around 11 AM associated with left facial numbness and left arm numbness.  She reports that this resolved after about 20 minutes of symptoms.     Assessment & Plan:   Principal Problem:   TIA (transient ischemic attack) Active Problems:   Warfarin anticoagulation   Paroxysmal atrial fibrillation (HCC)   Chronic diastolic CHF (congestive heart failure) (HCC)   Chronic pain syndrome   HTN (hypertension)  1-TIA;  MRI brain negative for acute stroke.  INR at 1.8. Yesterday, will repeat level today.  Report some numbness neck, not feeling quite right.  Neurology consulted.  Plan to get MRA.    2-Paroxysmal A fib;  Continue with coumadin per pharmacy. Check INR today.  Adjust coumadin as needed.  Hr low, hold BB. Monitor on telemetry  Resume flecainide. Check Mg level. Resume k supplementation.  Replace k and mg as needed.   Vomiting;  3 episodes. No abdominal pain.  Check lipase. Start IV fluids.   Bradycardia; hold atenolol.  Had transient episode per nurse, patient was vomiting.   Chronic diarrhea;  Imodium PRN.  Chronic.   Chronic diastolic HF; hold diuretic, patient vomiting.   HTN;  Hold Avapro due to  Mild increase k level.  Repeated B-met k at 3.9.   Hypothyroidism on synthroid,    DVT prophylaxis: on coumadin.  Code Status: Full code.  Family Communication: care discussed with patient and husband Disposition Plan: home when work up completed and stable.   Consultants:   Neurology    Procedures:   ECHO; no source for embolism.   Carotid doppler.    Antimicrobials:  none  Subjective: Patient report  some fullness in her forehead, also was having some neck numbness this am.  denies blurry vision.   Objective: Vitals:   03/15/17 2100 03/15/17 2214 03/16/17 0604 03/16/17 1339  BP: (!) 147/67 (!) 158/65 (!) 142/79 (!) 136/44  Pulse: 67 (!) 45 70 (!) 36  Resp: _0 Temp:  98.2 F (36.8 C) 97.9 F (36.6 C) 97.8 F (36.6 C)  TempSrc:  Oral Oral Oral  SpO2: 97% 96% 98% 100%  Weight:  61.2 kg (134 lb 14.7 oz)    Height:  _1  (1.575 m)      Intake/Output Summary (Last 24 hours) at 03/16/2017 1700 Last data filed at 03/16/2017 1300 Gross per 24 hour  Intake 480 ml  Output -  Net 480 ml   Filed Weights   03/15/17 1236 03/15/17 2214  Weight: 61.2 kg (135 lb) 61.2 kg (134 lb 14.7 oz)    Examination:  General exam: Appears calm and comfortable  Respiratory system: Clear to auscultation. Respiratory effort normal. Cardiovascular system: S1 & S2 heard, RRR. No JVD, murmurs, rubs, gallops or clicks. No pedal edema. Gastrointestinal system: Abdomen is nondistended, soft and nontender. No organomegaly or masses felt. Normal bowel sounds heard. Central nervous system: Alert and oriented. No focal neurological deficits. Extremities: Symmetric 5 x 5 power. Skin: No rashes, lesions or ulcers Psychiatry: Judgement and insight appear normal. Mood & affect appropriate.     Data Reviewed: I have personally reviewed following labs and imaging studies  CBC: Recent Labs  Lab 03/15/17 1320  WBC 5.8  NEUTROABS 3.4  HGB 12.0  HCT 37.6  MCV 94.9  PLT 209   Basic Metabolic Panel: Recent Labs  Lab 03/15/17 1320 03/16/17 0717  NA 131* 134*  K 5.0 3.9  CL 93* 98*  CO2 27 26  GLUCOSE 97 100*  BUN 16 12  CREATININE 0.83 0.84  CALCIUM 8.6* 8.4*   GFR: Estimated Creatinine Clearance: 43.7 mL/min (by C-G formula based on SCr of 0.84 mg/dL). Liver Function Tests: Recent Labs  Lab 03/15/17 1320  AST 19  ALT 11*  ALKPHOS 56  BILITOT 0.7  PROT 6.7  ALBUMIN 3.6   No  results for input(s): LIPASE, AMYLASE in the last 168 hours. No results for input(s): AMMONIA in the last 168 hours. Coagulation Profile: Recent Labs  Lab 03/15/17 1320  INR 1.83   Cardiac Enzymes: No results for input(s): CKTOTAL, CKMB, CKMBINDEX, TROPONINI in the last 168 hours. BNP (last 3 results) No results for input(s): PROBNP in the last 8760 hours. HbA1C: Recent Labs    03/16/17 1429  HGBA1C 5.3   CBG: No results for input(s): GLUCAP in the last 168 hours. Lipid Profile: No results for input(s): CHOL, HDL, LDLCALC, TRIG, CHOLHDL, LDLDIRECT in the last 72 hours. Thyroid Function Tests: No results for input(s): TSH, T4TOTAL, FREET4, T3FREE, THYROIDAB in the last 72 hours. Anemia Panel: No results for input(s): VITAMINB12, FOLATE, FERRITIN, TIBC, IRON, RETICCTPCT in the last 72 hours. Sepsis Labs: No results for input(s): PROCALCITON, LATICACIDVEN in the last 168 hours.  No results found for this or any previous visit (from the past 240 hour(s)).       Radiology Studies: Dg Chest 2 View  Result Date: 03/15/2017 CLINICAL DATA:  Initial evaluation for acute TIA, left vision loss. EXAM: CHEST - 2 VIEW COMPARISON:  Prior radiograph from 01/28/2017. FINDINGS: Cardiac and mediastinal silhouettes are stable in size and contour, and remain within normal limits. Aortic atherosclerosis. Lungs hyperinflated with attenuation of the pulmonary markings, consistent with emphysema. No focal infiltrates or edema. Blunting of the left costophrenic angle may reflect a trace left pleural effusion. No pneumothorax. Subcentimeter calcified granuloma the noted at the peripheral right midlung. Left shoulder arthroplasty partially visualized. No acute osseous abnormality. IMPRESSION: 1. Blunting of the left costophrenic angle, suggestive of small left pleural effusion. 2. No other active cardiopulmonary disease. 3. Emphysema. 4. Aortic atherosclerosis. Electronically Signed   By: Jeannine Boga M.D.   On: 03/15/2017 17:17   Ct Head Wo Contrast  Result Date: 03/15/2017 CLINICAL DATA:  LEFT vision changes, LEFT arm numbness for 30 minutes beginning at 11:30 a.m. Assess TIA. History of hypertension, hyperlipidemia and stroke. EXAM: CT HEAD WITHOUT CONTRAST TECHNIQUE: Contiguous axial images were obtained from the base of the skull through the vertex without intravenous contrast. COMPARISON:  MRI of the head December 06, 2016 FINDINGS: BRAIN: No intraparenchymal hemorrhage, mass effect nor midline shift. The ventricles and sulci are normal for age. Mild patchy to confluent supratentorial white matter hypodensities. No acute large vascular territory infarcts. No abnormal extra-axial fluid collections. Basal cisterns are patent. VASCULAR: Mild to moderate calcific atherosclerosis of the carotid siphons. SKULL: No skull fracture. Osteopenia. Moderate temporomandibular osteoarthrosis. No significant scalp soft tissue swelling. SINUSES/ORBITS: Trace LEFT mastoid effusion.The included ocular globes and orbital contents are non-suspicious. Status post bilateral ocular lens implants. Partially imaged potential LEFT dacryocyst. OTHER: None. IMPRESSION: 1. No acute intracranial process. 2. Stable moderate to severe chronic small vessel  ischemic disease. Electronically Signed   By: Elon Alas M.D.   On: 03/15/2017 14:24   Mr Brain Wo Contrast  Result Date: 03/15/2017 CLINICAL DATA:  Focal neuro deficit greater than 6 hours. Suspect stroke. Left vision changes. Left arm numbness. EXAM: MRI HEAD WITHOUT CONTRAST TECHNIQUE: Multiplanar, multiecho pulse sequences of the brain and surrounding structures were obtained without intravenous contrast. COMPARISON:  CT 03/15/2017.  MRI 12/06/2016 FINDINGS: Brain: Negative for acute infarct. Patchy hyperintensity throughout the cerebral white matter bilaterally unchanged from the prior MRI. Negative for hemorrhage or mass. Negative for hydrocephalus. Mild  atrophy. Vascular: Normal arterial flow voids. Skull and upper cervical spine: Negative. Sinuses/Orbits: Bilateral cataract removal.  Paranasal sinuses clear Other: None IMPRESSION: No acute abnormality Atrophy and moderate chronic microvascular ischemic changes stable from the prior MRI. Electronically Signed   By: Franchot Gallo M.D.   On: 03/15/2017 17:35   US Carotid Bilateral (at [facility] Only)  Result Date: 03/15/2017 CLINICAL DATA:  84 year old female with a history of visual disturbance. Cardiovascular risk factors include hypertension, stroke/TIA, hyperlipidemia EXAM: BILATERAL CAROTID DUPLEX ULTRASOUND TECHNIQUE: Pearline Cables scale imaging, color Doppler and duplex ultrasound were performed of bilateral carotid and vertebral arteries in the neck. COMPARISON:  No prior duplex FINDINGS: Criteria: Quantification of carotid stenosis is based on velocity parameters that correlate the residual internal carotid diameter with NASCET-based stenosis levels, using the diameter of the distal internal carotid lumen as the denominator for stenosis measurement. The following velocity measurements were obtained: RIGHT ICA:  Systolic 83 cm/sec, Diastolic 19 cm/sec CCA:  60 cm/sec SYSTOLIC ICA/CCA RATIO:  1.4 ECA:  47 cm/sec LEFT ICA:  Systolic 96 cm/sec, Diastolic 24 cm/sec CCA:  70 cm/sec SYSTOLIC ICA/CCA RATIO:  1.4 ECA:  63 cm/sec Right Brachial SBP: Not acquired Left Brachial SBP: Not acquired RIGHT CAROTID ARTERY: No significant calcifications of the right common carotid artery. Intermediate waveform maintained. Heterogeneous and partially calcified plaque at the right carotid bifurcation. No significant lumen shadowing. Low resistance waveform of the right ICA. Tortuosity. RIGHT VERTEBRAL ARTERY: Antegrade flow with low resistance waveform. LEFT CAROTID ARTERY: No significant calcifications of the left common carotid artery. Intermediate waveform maintained. Heterogeneous and partially calcified plaque at the left  carotid bifurcation without significant lumen shadowing. Low resistance waveform of the left ICA. Tortuosity. LEFT VERTEBRAL ARTERY:  Antegrade flow with low resistance waveform. IMPRESSION: Color duplex indicates moderate heterogeneous and calcified plaque, with no hemodynamically significant stenosis by duplex criteria in the extracranial cerebrovascular circulation. Signed, Dulcy Fanny. Earleen Newport, DO [facility] Electronically Signed   By: Corrie Mckusick D.O.   On: 03/15/2017 17:05        Scheduled Meds: . aspirin  300 mg Rectal Daily   Or  . aspirin  325 mg Oral Daily  . atorvastatin  20 mg Oral QHS  . levETIRAcetam  130 mg Oral BID  . levothyroxine  50 mcg Oral QAC breakfast  . mirtazapine  15 mg Oral QHS  . pantoprazole  40 mg Oral Daily  . warfarin  5 mg Oral ONCE-1800  . Warfarin - Pharmacist Dosing Inpatient   Does not apply Q24H   Continuous Infusions:   LOS: 0 days    Time spent: 35 minutes.     Elmarie Shiley, MD [facility] Pager [phone]  If 7PM-7AM, please contact night-coverage [email or web address] Password [facility] 03/16/2017, 5:00 PM

## 2017-03-16 NOTE — Evaluation (Signed)
Physical Therapy Evaluation Patient Details Name: [name] MRN: [id number] DOB: 21-May-1932 Today's Date: 03/16/2017   History of Present Illness  Pt is an 84 y.o. female who presented to the ED with L vision loss as well as L facial and UE weakness that resolved after about 20 minutes. MRI and CT negative for acute abnormality.   Clinical Impression  Patient evaluated by Physical Therapy with no further acute PT needs identified. Education on BE FAST stroke symptomology has been completed and the patient has no further questions. Pt was modified independent with all mobility and appears to be at her baseline level of function.  PT is signing off. Thank you for this referral.     Follow Up Recommendations No PT follow up    Equipment Recommendations  None recommended by PT    Recommendations for Other Services       Precautions / Restrictions Precautions Precautions: Fall Restrictions Weight Bearing Restrictions: No      Mobility  Bed Mobility Overal bed mobility: Modified Independent                Transfers Overall transfer level: Modified independent Equipment used: Rolling walker (2 wheeled)             General transfer comment: Modified independent with use of RW.   Ambulation/Gait Ambulation/Gait assistance: Modified independent (Device/Increase time) Ambulation Distance (Feet): 150 Feet(75 with RW, 75 without AD) Assistive device: Rolling walker (2 wheeled);None Gait Pattern/deviations: Step-through pattern;Wide base of support;Decreased step length - right;Decreased step length - left Gait velocity: slowed Gait velocity interpretation: Below normal speed for age/gender General Gait Details: slowed steady cadence with and without RW, no overt deviations      Modified Rankin (Stroke Patients Only) Modified Rankin (Stroke Patients Only) Pre-Morbid Rankin Score: No symptoms Modified Rankin: No symptoms     Balance Overall balance assessment:  Needs assistance Sitting-balance support: Feet unsupported;No upper extremity supported Sitting balance-Leahy Scale: Normal     Standing balance support: No upper extremity supported;During functional activity Standing balance-Leahy Scale: Good               High level balance activites: Sudden stops;Head turns High Level Balance Comments: pt able to maintain cadence with ambulation with head turns and no instability with sudden stops.             Pertinent Vitals/Pain Pain Assessment: No/denies pain    Home Living Family/patient expects to be discharged to:: Private residence Living Arrangements: Spouse/significant other Available Help at Discharge: Family;Available 24 hours/day Type of Home: House Home Access: Stairs to enter   [employer] of Steps: 2 Home Layout: Able to live on main level with bedroom/bathroom(has basement but walks around home to get to) Home Equipment: Gilford Rile - 2 wheels;Shower seat;Grab bars - tub/shower(walking skick)      Prior Function Level of Independence: Independent with assistive device(s)         Comments: Uses RW or walking stick for mobility.      Hand Dominance   Dominant Hand: Right    Extremity/Trunk Assessment   Upper Extremity Assessment Upper Extremity Assessment: Defer to OT evaluation    Lower Extremity Assessment Lower Extremity Assessment: Overall WFL for tasks assessed       Communication   Communication: No difficulties  Cognition Arousal/Alertness: Awake/alert Behavior During Therapy: WFL for tasks assessed/performed Overall Cognitive Status: Impaired/Different from baseline Area of Impairment: Memory  Memory: Decreased short-term memory         General Comments: Had a little difficulty recounting sequence of events that brought her to the hospital      General Comments General comments (skin integrity, edema, etc.): Husband and grandson present during  session. Everyone very anxious to get  home.     Exercises     Assessment/Plan    PT Assessment Patent does not need any further PT services         PT Goals (Current goals can be found in the Care Plan section)  Acute Rehab PT Goals Patient Stated Goal: go home after tests     AM-PAC PT "6 Clicks" Daily Activity  Outcome Measure Difficulty turning over in bed (including adjusting bedclothes, sheets and blankets)?: None Difficulty moving from lying on back to sitting on the side of the bed? : A Little Difficulty sitting down on and standing up from a chair with arms (e.g., wheelchair, bedside commode, etc,.)?: A Little Help needed moving to and from a bed to chair (including a wheelchair)?: None Help needed walking in hospital room?: None Help needed climbing 3-5 steps with a railing? : None 6 Click Score: 22    End of Session Equipment Utilized During Treatment: Gait belt Activity Tolerance: Patient tolerated treatment well Patient left: in chair;with call bell/phone within reach;with family/visitor present Nurse Communication: Mobility status      Time: 1453-1510 PT Time Calculation (min) (ACUTE ONLY): 17 min   Charges:   PT Evaluation $PT Eval Moderate Complexity: 1 Mod     PT G Codes:        Breya Cass B. Migdalia Dk PT, DPT Acute Rehabilitation  [phone] Pager [phone]    [place] 03/16/2017, 4:25 PM

## 2017-03-16 NOTE — Progress Notes (Signed)
ANTICOAGULATION CONSULT NOTE - Follow Up Consult  Pharmacy Consult for Coumadin Indication: atrial fibrillation  Allergies  Allergen Reactions  . Penicillins Rash and Other (See Comments)    Has patient had a PCN reaction causing immediate rash, facial/tongue/throat swelling, SOB or lightheadedness with hypotension: No Has patient had a PCN reaction causing severe rash involving mucus membranes or skin necrosis: No Has patient had a PCN reaction that required hospitalization No Has patient had a PCN reaction occurring within the last 10 years: No If all of the above answers are "NO", then may proceed with Cephalosporin use.    Patient Measurements: Height: 5\' 2"  (157.5 cm) Weight: 134 lb 14.7 oz (61.2 kg) IBW/kg (Calculated) : 50.1  Vital Signs: Temp: 97.8 F (36.6 C) (03/13 1339) Temp Source: Oral (03/13 1339) BP: 136/44 (03/13 1339) Pulse Rate: 36 (03/13 1339)  Labs: Recent Labs    03/15/17 1320 03/16/17 0717  HGB 12.0  --   HCT 37.6  --   PLT 243  --   APTT 41*  --   LABPROT 21.0*  --   INR 1.83  --   CREATININE 0.83 0.84    Estimated Creatinine Clearance: 43.7 mL/min (by C-G formula based on SCr of 0.84 mg/dL).   Medications:  Medications Prior to Admission  Medication Sig Dispense Refill Last Dose  . Acetaminophen (TYLENOL) 325 MG CAPS Take 650 mg by mouth every 6 (six) hours as needed for mild pain.   03/15/2017 at Unknown time  . ARTIFICIAL TEAR OP Apply 2 drops to eye daily as needed (Dryness). To the left eye   03/15/2017 at Unknown time  . atenolol (TENORMIN) 50 MG tablet Take 1 tablet (50 mg total) by mouth daily. Take 1 tablet (50mg ) once daily (Patient taking differently: Take 25 mg by mouth 2 (two) times daily. Take 1 tablet (50mg ) once daily) 30 tablet 1 03/15/2017 at 0930  . atorvastatin (LIPITOR) 20 MG tablet TAKE 1 TABLET BY MOUTH AT  BEDTIME 90 tablet 1 03/14/2017 at Unknown time  . flecainide (TAMBOCOR) 50 MG tablet Take 1 tablet (50 mg total) by  mouth 2 (two) times daily. (Patient taking differently: Take 25 mg by mouth 2 (two) times daily. ) 60 tablet 1 03/15/2017 at Unknown time  . furosemide (LASIX) 40 MG tablet TAKE 1/2 TABLET BY MOUTH  EVERY MORNING , THEN TAKE 1 TABLET BY MOUTH IF SWELLING OCCURS 90 tablet 1 03/15/2017 at Unknown time  . irbesartan (AVAPRO) 150 MG tablet Take 1 tablet (150 mg total) by mouth daily. 90 tablet 0 03/15/2017 at Unknown time  . levETIRAcetam (KEPPRA) 250 MG tablet Take 1 tablet (250 mg total) by mouth 2 (two) times daily. (Patient taking differently: Take 125 mg by mouth 2 (two) times daily. ) 180 tablet 0 03/15/2017 at Unknown time  . levothyroxine (SYNTHROID, LEVOTHROID) 50 MCG tablet Take 50 mcg by mouth daily before breakfast.    03/15/2017 at Unknown time  . mirtazapine (REMERON) 15 MG tablet TAKE ONE TABLET AT BEDTIME 30 tablet 5 03/14/2017 at Unknown time  . pantoprazole (PROTONIX) 40 MG tablet TAKE 1 TABLET DAILY 90 tablet 1 03/15/2017 at Unknown time  . potassium chloride SA (K-DUR,KLOR-CON) 20 MEQ tablet Take 10 mEq by mouth 2 (two) times daily.    03/15/2017 at Unknown time  . warfarin (COUMADIN) 5 MG tablet Take 1-2 tablets (5-10 mg total) by mouth daily. (Patient taking differently: Take 5 mg by mouth daily. ) 60 tablet 0 03/15/2017 at 0930  Assessment: 84 yo F on Coumadin PTA for hx afib.  INR subtherapeutic on admission and pt given extra dose of Coumadin last night.  Will return to home dose tonight of 5mg .  Goal of Therapy:  INR 2-3 Monitor platelets by anticoagulation protocol: Yes   Plan:  Coumadin 5mg  PO x 1 tonight Reorder daily INRs to start 3/14.  [employer], Pharm.D., BCPS Clinical Pharmacist Pager: [phone] Clinical phone for 03/16/2017 from 8:30-4:00 is [phone]. After 4pm, please call Main Rx [phone]) for assistance. 03/16/2017 2:13 PM

## 2017-03-17 DIAGNOSIS — I480 Paroxysmal atrial fibrillation: Secondary | ICD-10-CM | POA: Diagnosis not present

## 2017-03-17 DIAGNOSIS — I10 Essential (primary) hypertension: Secondary | ICD-10-CM | POA: Diagnosis not present

## 2017-03-17 DIAGNOSIS — G459 Transient cerebral ischemic attack, unspecified: Secondary | ICD-10-CM | POA: Diagnosis not present

## 2017-03-17 DIAGNOSIS — Z7901 Long term (current) use of anticoagulants: Secondary | ICD-10-CM | POA: Diagnosis not present

## 2017-03-17 DIAGNOSIS — I5032 Chronic diastolic (congestive) heart failure: Secondary | ICD-10-CM | POA: Diagnosis not present

## 2017-03-17 DIAGNOSIS — G894 Chronic pain syndrome: Secondary | ICD-10-CM | POA: Diagnosis not present

## 2017-03-17 DIAGNOSIS — I110 Hypertensive heart disease with heart failure: Secondary | ICD-10-CM | POA: Diagnosis not present

## 2017-03-17 LAB — LIPID PANEL
CHOL/HDL RATIO: 1.9 ratio
CHOLESTEROL: 154 mg/dL (ref 0–200)
HDL: 80 mg/dL (ref >40)
LDL Cholesterol: 62 mg/dL (ref 0–99)
Triglycerides: 62 mg/dL (ref <150)
VLDL: 12 mg/dL (ref 0–40)

## 2017-03-17 LAB — PROTIME-INR
INR: 3.01
Prothrombin Time: 31 seconds — ABNORMAL HIGH (ref 11.4–15.2)

## 2017-03-17 MED ORDER — WARFARIN SODIUM 5 MG PO TABS: 5.0000 mg | ORAL_TABLET | Freq: Every day | ORAL | 0 refills | Status: DC

## 2017-03-17 MED ORDER — POTASSIUM CHLORIDE CRYS ER 20 MEQ PO TBCR: 10.0000 meq | EXTENDED_RELEASE_TABLET | Freq: Every day | ORAL | 0 refills | Status: DC

## 2017-03-17 MED ORDER — FLECAINIDE ACETATE 50 MG PO TABS
25.0000 mg | ORAL_TABLET | Freq: Two times a day (BID) | ORAL | Status: DC
  Administered 2017-03-17: 25 mg via ORAL
  Filled 2017-03-17: qty 1

## 2017-03-17 MED ORDER — FLECAINIDE ACETATE 50 MG PO TABS: 25.0000 mg | ORAL_TABLET | Freq: Two times a day (BID) | ORAL | 0 refills | Status: DC

## 2017-03-17 MED ORDER — LEVETIRACETAM 250 MG PO TABS: 125.0000 mg | ORAL_TABLET | Freq: Two times a day (BID) | ORAL | 0 refills | Status: DC

## 2017-03-17 NOTE — Progress Notes (Signed)
STROKE TEAM PROGRESS NOTE   SUBJECTIVE (INTERVAL HISTORY) Her husband is at the bedside.  Overall she feels her condition is completely resolved. She stated that she did not take eliquis due to financial reasons, not side effect. She will discuss with her PCP after discharge about potential transfer coumadin back to eliquis.   Her left arm numbness resolved and left vision recovered too. She can not tell me clearly whether it was amaurosis fugax or left hemianopia.    OBJECTIVE Temp:  [97.5 F (36.4 C)-97.8 F (36.6 C)] 97.7 F (36.5 C) (03/14 0532) Pulse Rate:  [36-70] 58 (03/14 0532) Cardiac Rhythm: Heart block (03/14 0718) Resp:  [16-20] 16 (03/14 0532) BP: (136-168)/(44-77) 168/61 (03/14 0532) SpO2:  [94 %-100 %] 97 % (03/14 0532)  No results for input(s): GLUCAP in the last 168 hours. Recent Labs  Lab 03/15/17 1320 03/16/17 0717 03/16/17 1739  NA 131* 134* 134*  K 5.0 3.9 4.2  CL 93* 98* 99*  CO2 27 26 25   GLUCOSE 97 100* 105*  BUN 16 12 12   CREATININE 0.83 0.84 0.77  CALCIUM 8.6* 8.4* 8.5*  MG  --   --  2.1   Recent Labs  Lab 03/15/17 1320  AST 19  ALT 11*  ALKPHOS 56  BILITOT 0.7  PROT 6.7  ALBUMIN 3.6   Recent Labs  Lab 03/15/17 1320  WBC 5.8  NEUTROABS 3.4  HGB 12.0  HCT 37.6  MCV 94.9  PLT 243   No results for input(s): CKTOTAL, CKMB, CKMBINDEX, TROPONINI in the last 168 hours. Recent Labs    03/15/17 1320 03/16/17 1739 03/17/17 0337  LABPROT 21.0* 29.5* 31.0*  INR 1.83 2.82 3.01   Recent Labs    03/15/17 1256  COLORURINE YELLOW  LABSPEC 1.008  PHURINE 5.0  GLUCOSEU NEGATIVE  HGBUR NEGATIVE  BILIRUBINUR NEGATIVE  KETONESUR NEGATIVE  PROTEINUR NEGATIVE  NITRITE NEGATIVE  LEUKOCYTESUR NEGATIVE       Component Value Date/Time   CHOL 154 03/17/2017 0337   TRIG 62 03/17/2017 0337   HDL 80 03/17/2017 0337   CHOLHDL 1.9 03/17/2017 0337   VLDL 12 03/17/2017 0337   LDLCALC 62 03/17/2017 0337   Lab Results  Component Value Date    HGBA1C 5.3 03/16/2017      Component Value Date/Time   LABOPIA NONE DETECTED 03/03/2015 1605   COCAINSCRNUR NONE DETECTED 03/03/2015 1605   LABBENZ NONE DETECTED 03/03/2015 1605   AMPHETMU NONE DETECTED 03/03/2015 1605   THCU NONE DETECTED 03/03/2015 1605   LABBARB NONE DETECTED 03/03/2015 1605    No results for input(s): ETH in the last 168 hours.  I have personally reviewed the radiological images below and agree with the radiology interpretations.  Dg Chest 2 View  Result Date: 03/15/2017 CLINICAL DATA:  Initial evaluation for acute TIA, left vision loss. EXAM: CHEST - 2 VIEW COMPARISON:  Prior radiograph from 01/28/2017. FINDINGS: Cardiac and mediastinal silhouettes are stable in size and contour, and remain within normal limits. Aortic atherosclerosis. Lungs hyperinflated with attenuation of the pulmonary markings, consistent with emphysema. No focal infiltrates or edema. Blunting of the left costophrenic angle may reflect a trace left pleural effusion. No pneumothorax. Subcentimeter calcified granuloma the noted at the peripheral right midlung. Left shoulder arthroplasty partially visualized. No acute osseous abnormality. IMPRESSION: 1. Blunting of the left costophrenic angle, suggestive of small left pleural effusion. 2. No other active cardiopulmonary disease. 3. Emphysema. 4. Aortic atherosclerosis. Electronically Signed   By: Pincus Badder.D.  On: 03/15/2017 17:17   Ct Head Wo Contrast  Result Date: 03/15/2017 CLINICAL DATA:  LEFT vision changes, LEFT arm numbness for 30 minutes beginning at 11:30 a.m. Assess TIA. History of hypertension, hyperlipidemia and stroke. EXAM: CT HEAD WITHOUT CONTRAST TECHNIQUE: Contiguous axial images were obtained from the base of the skull through the vertex without intravenous contrast. COMPARISON:  MRI of the head December 06, 2016 FINDINGS: BRAIN: No intraparenchymal hemorrhage, mass effect nor midline shift. The ventricles and sulci are  normal for age. Mild patchy to confluent supratentorial white matter hypodensities. No acute large vascular territory infarcts. No abnormal extra-axial fluid collections. Basal cisterns are patent. VASCULAR: Mild to moderate calcific atherosclerosis of the carotid siphons. SKULL: No skull fracture. Osteopenia. Moderate temporomandibular osteoarthrosis. No significant scalp soft tissue swelling. SINUSES/ORBITS: Trace LEFT mastoid effusion.The included ocular globes and orbital contents are non-suspicious. Status post bilateral ocular lens implants. Partially imaged potential LEFT dacryocyst. OTHER: None. IMPRESSION: 1. No acute intracranial process. 2. Stable moderate to severe chronic small vessel ischemic disease. Electronically Signed   By: Elon Alas M.D.   On: 03/15/2017 14:24   Mr Jodene Nam Head Wo Contrast  Result Date: 03/16/2017 CLINICAL DATA:  Left vision loss. Left facial and left arm numbness. EXAM: MRA HEAD WITHOUT CONTRAST TECHNIQUE: Angiographic images of the Circle of Willis were obtained using MRA technique without intravenous contrast. COMPARISON:  Brain MRI 03/15/2017 FINDINGS: Intracranial internal carotid arteries: Normal. Anterior cerebral arteries: Normal. Middle cerebral arteries: Normal. Posterior communicating arteries: Present on the right only. Posterior cerebral arteries: Normal. Basilar artery: Normal. Vertebral arteries: Left dominant. Normal. Superior cerebellar arteries: Normal. Anterior inferior cerebellar arteries: Normal. Posterior inferior cerebellar arteries: Normal. IMPRESSION: No large vessel occlusion or hemodynamically significant stenosis. Electronically Signed   By: Ulyses Jarred M.D.   On: 03/16/2017 23:06   Mr Brain Wo Contrast  Result Date: 03/15/2017 CLINICAL DATA:  Focal neuro deficit greater than 6 hours. Suspect stroke. Left vision changes. Left arm numbness. EXAM: MRI HEAD WITHOUT CONTRAST TECHNIQUE: Multiplanar, multiecho pulse sequences of the brain and  surrounding structures were obtained without intravenous contrast. COMPARISON:  CT 03/15/2017.  MRI 12/06/2016 FINDINGS: Brain: Negative for acute infarct. Patchy hyperintensity throughout the cerebral white matter bilaterally unchanged from the prior MRI. Negative for hemorrhage or mass. Negative for hydrocephalus. Mild atrophy. Vascular: Normal arterial flow voids. Skull and upper cervical spine: Negative. Sinuses/Orbits: Bilateral cataract removal.  Paranasal sinuses clear Other: None IMPRESSION: No acute abnormality Atrophy and moderate chronic microvascular ischemic changes stable from the prior MRI. Electronically Signed   By: Franchot Gallo M.D.   On: 03/15/2017 17:35   US Carotid Bilateral (at [facility] Only)  Result Date: 03/15/2017 CLINICAL DATA:  84 year old female with a history of visual disturbance. Cardiovascular risk factors include hypertension, stroke/TIA, hyperlipidemia EXAM: BILATERAL CAROTID DUPLEX ULTRASOUND TECHNIQUE: Pearline Cables scale imaging, color Doppler and duplex ultrasound were performed of bilateral carotid and vertebral arteries in the neck. COMPARISON:  No prior duplex FINDINGS: Criteria: Quantification of carotid stenosis is based on velocity parameters that correlate the residual internal carotid diameter with NASCET-based stenosis levels, using the diameter of the distal internal carotid lumen as the denominator for stenosis measurement. The following velocity measurements were obtained: RIGHT ICA:  Systolic 83 cm/sec, Diastolic 19 cm/sec CCA:  60 cm/sec SYSTOLIC ICA/CCA RATIO:  1.4 ECA:  47 cm/sec LEFT ICA:  Systolic 96 cm/sec, Diastolic 24 cm/sec CCA:  70 cm/sec SYSTOLIC ICA/CCA RATIO:  1.4 ECA:  63 cm/sec Right Brachial SBP:  Not acquired Left Brachial SBP: Not acquired RIGHT CAROTID ARTERY: No significant calcifications of the right common carotid artery. Intermediate waveform maintained. Heterogeneous and partially calcified plaque at the right carotid bifurcation. No  significant lumen shadowing. Low resistance waveform of the right ICA. Tortuosity. RIGHT VERTEBRAL ARTERY: Antegrade flow with low resistance waveform. LEFT CAROTID ARTERY: No significant calcifications of the left common carotid artery. Intermediate waveform maintained. Heterogeneous and partially calcified plaque at the left carotid bifurcation without significant lumen shadowing. Low resistance waveform of the left ICA. Tortuosity. LEFT VERTEBRAL ARTERY:  Antegrade flow with low resistance waveform. IMPRESSION: Color duplex indicates moderate heterogeneous and calcified plaque, with no hemodynamically significant stenosis by duplex criteria in the extracranial cerebrovascular circulation. Signed, Dulcy Fanny. Earleen Newport, DO [facility] Electronically Signed   By: Corrie Mckusick D.O.   On: 03/15/2017 17:05   TTE  - Left ventricle: The cavity size was normal. There was moderate   concentric hypertrophy. Systolic function was normal. The   estimated ejection fraction was in the range of 60% to 65%. Wall   motion was normal; there were no regional wall motion   abnormalities. Doppler parameters are consistent with abnormal   left ventricular relaxation (grade 1 diastolic dysfunction). The   E/e&' ratio is <8, suggesting normal LV filling pressure. - Aortic valve: Sclerosis without stenosis. Transvalvular velocity   was minimally increased. There was no regurgitation. - Mitral valve: Mildly thickened leaflets . There was trivial   regurgitation. - Left atrium: Moderately dilated. - Right atrium: The atrium was at the upper limits of normal in   size. - Inferior vena cava: The vessel was normal in size. The   respirophasic diameter changes were in the normal range (>= 50%),   consistent with normal central venous pressure. Impressions: - Compared to a prior study in 2018, the LVEF is unchanged. LV   filling pressure is lower.   PHYSICAL  EXAM  Temp:  [97.5 F (36.4 C)-97.8 F (36.6 C)] 97.7 F (36.5 C) (03/14 0532) Pulse Rate:  [36-70] 58 (03/14 0532) Resp:  [16-20] 16 (03/14 0532) BP: (136-168)/(44-77) 168/61 (03/14 0532) SpO2:  [94 %-100 %] 97 % (03/14 0532)  General - Well nourished, well developed, in no apparent distress.  Ophthalmologic - fundi not visualized due to noncooperation.  Cardiovascular - Regular rate and rhythm.  Mental Status -  Level of arousal and orientation to time, place, and person were intact. Language including expression, naming, repetition, comprehension was assessed and found intact. Fund of Knowledge was assessed and was intact.  Cranial Nerves II - XII - II - Visual field intact OU. III, IV, VI - Extraocular movements intact. V - Facial sensation intact bilaterally. VII - Facial movement intact bilaterally. VIII - Hearing & vestibular intact bilaterally. X - Palate elevates symmetrically. XI - Chin turning & shoulder shrug intact bilaterally. XII - Tongue protrusion intact.  Motor Strength - The patient's strength was normal in all extremities and pronator drift was absent.  Bulk was normal and fasciculations were absent.   Motor Tone - Muscle tone was assessed at the neck and appendages and was normal.  Reflexes - The patient's reflexes were symmetrical in all extremities and she had no pathological reflexes.  Sensory - Light touch, temperature/pinprick were assessed and were symmetrical.    Coordination - The patient had normal movements in the hands and feet with no ataxia or dysmetria.  Tremor was absent.  Gait and Station - deferred.  ASSESSMENT/PLAN Ms. CARRIANNE HYUN is a 84 y.o. female with history of afib on coumadin, HTN, HLD admitted for transient left arm numbness, left vision disturbance. No tPA given due to symptom resolved.    TIA:  right brain TIA likely due to afib on coumadin with subtherapeutic INR. However, complicated migraine without HA still on  differentials.   Resultant back to baseline  MRI  No acute infarct   MRA  Unremarkable   Carotid Doppler  Unremarkable   2D Echo  EF 60-65%  LDL 62  HgbA1c 5.3  Warfarin for VTE prophylaxis  Fall precautions  Diet Heart Room service appropriate? Yes; Fluid consistency: Thin  Diet - low sodium heart healthy   warfarin daily prior to admission, now on warfarin daily. INR goal 2-3. No need for ASA at this time.   Patient counseled to be compliant with her antithrombotic medications  Ongoing aggressive stroke risk factor management  Therapy recommendations:  none  Disposition:  Likely home  AFib on coumadin  INR on admission 1.8  Now INR 2.8->3.0  Was on eliquis for some time but stopped due to financial concern  Pt now would like to discuss with PCP after discharge to consider back to eliquis  ? Hx of seizure  04/2015 acute onset confusion - EEG no seizure, MRI and MRA negative, put on keppra 250mg  bid  Continue keppra for now  Will follow up with [facility] at outpt to repeat EEG and to consider taper off if able  Hypertension Stable  Long term BP goal normotensive  Hyperlipidemia  Home meds:  lipitor 20   LDL 62, goal < 70  Now on lipitor 20  Continue statin at discharge  Other Stroke Risk Factors  Advanced age  Other Active Problems  Mild hyponatremia  Hospital day # 0  Neurology will sign off. Please call with questions. Pt will follow up with stroke clinic NP at [facility] in about 4 weeks. Thanks for the consult.   Rosalin Hawking, MD PhD Stroke Neurology 03/17/2017 1:12 PM    To contact Stroke Continuity provider, please refer to [email or web address] After hours, contact General Neurology

## 2017-03-17 NOTE — Discharge Summary (Signed)
Physician Discharge Summary  HAYDAN WEDIG RSW:[id number] DOB: Feb 17, 1932 DOA: 03/15/2017  PCP: Timmothy Euler, MD  Admit date: 03/15/2017 Discharge date: 03/17/2017  Admitted From: Home  Disposition:  Home   Recommendations for Outpatient Follow-up:  1. Follow up with PCP in 1-2 weeks 2. Please obtain BMP/CBC in one week 3. Needs to assess need to be back on lasix. Repeat EKG. Also BB stopped, needs to assess if patient can be restarted on BB 4. Adjust coumadin as needed.   Discharge Condition; stable.  CODE STATUS: full code.  Diet recommendation: Heart Healthy   Brief/Interim Summary:  Brief Narrative by Dr Raelene Bott: Terri Seal Knightis a 84 y.o.femalewith medical history significant ofA. fib on Coumadin, hypertension, hyperlipidemia comes in with acute onset of left vision loss that occurred earlier this morning around 11 AM associated with left facial numbness and left arm numbness. She reports that this resolved after about 20 minutes of symptoms.     Assessment & Plan:   Principal Problem:   TIA (transient ischemic attack) Active Problems:   Warfarin anticoagulation   Paroxysmal atrial fibrillation (HCC)   Chronic diastolic CHF (congestive heart failure) (HCC)   Chronic pain syndrome   HTN (hypertension)  1-TIA;  MRI brain negative for acute stroke.  INR at 1.8. On admission.  Report some numbness neck, not feeling quite right.  Neurology consulted.  MRA negative for big vessel occlusion.  INR today at 3.  Home today   2-Paroxysmal A fib;  Continue with coumadin per pharmacy.  Discharge on 5 mg daily of coumadin.  Hr low, hold BB. Monitor on telemetry  Resume flecainide. Mg normal.  Replace k and mg as needed.   Vomiting;  3 episodes. No abdominal pain.  Resolved. Might be related to indigestion.   Bradycardia; hold atenolol.   Had transient episode per nurse, patient was vomiting.   Chronic diarrhea;  Imodium PRN.  Chronic.    Chronic diastolic HF; hold diuretic, to avoid hypotension. And recent diarrhea, vomiting.   HTN;  Resume avapro.  Repeated B-met k at 3.9.   Hypothyroidism on synthroid,  history of seizure; continue with keppra.     Discharge Diagnoses:  Principal Problem:   TIA (transient ischemic attack) Active Problems:   Warfarin anticoagulation   Paroxysmal atrial fibrillation (HCC)   Chronic diastolic CHF (congestive heart failure) (HCC)   Chronic pain syndrome   HTN (hypertension)    Discharge Instructions  Discharge Instructions    Diet - low sodium heart healthy   Complete by:  As directed    Increase activity slowly   Complete by:  As directed      Allergies as of 03/17/2017      Reactions   Penicillins Rash, Other (See Comments)   Has patient had a PCN reaction causing immediate rash, facial/tongue/throat swelling, SOB or lightheadedness with hypotension: No Has patient had a PCN reaction causing severe rash involving mucus membranes or skin necrosis: No Has patient had a PCN reaction that required hospitalization No Has patient had a PCN reaction occurring within the last 10 years: No If all of the above answers are "NO", then may proceed with Cephalosporin use.      Medication List    STOP taking these medications   atenolol 50 MG tablet Commonly known as:  TENORMIN   furosemide 40 MG tablet Commonly known as:  LASIX     TAKE these medications   ARTIFICIAL TEAR OP Apply 2 drops to eye daily as  needed (Dryness). To the left eye   atorvastatin 20 MG tablet Commonly known as:  LIPITOR TAKE 1 TABLET BY MOUTH AT  BEDTIME   flecainide 50 MG tablet Commonly known as:  TAMBOCOR Take 0.5 tablets (25 mg total) by mouth 2 (two) times daily.   irbesartan 150 MG tablet Commonly known as:  AVAPRO Take 1 tablet (150 mg total) by mouth daily.   levETIRAcetam 250 MG tablet Commonly known as:  KEPPRA Take 0.5 tablets (125 mg total) by mouth 2 (two) times  daily.   levothyroxine 50 MCG tablet Commonly known as:  SYNTHROID, LEVOTHROID Take 50 mcg by mouth daily before breakfast.   mirtazapine 15 MG tablet Commonly known as:  REMERON TAKE ONE TABLET AT BEDTIME   pantoprazole 40 MG tablet Commonly known as:  PROTONIX TAKE 1 TABLET DAILY   potassium chloride SA 20 MEQ tablet Commonly known as:  K-DUR,KLOR-CON Take 0.5 tablets (10 mEq total) by mouth daily. What changed:  when to take this   TYLENOL 325 MG Caps Generic drug:  Acetaminophen Take 650 mg by mouth every 6 (six) hours as needed for mild pain.   warfarin 5 MG tablet Commonly known as:  COUMADIN Take as directed. If you are unsure how to take this medication, talk to your nurse or doctor. Original instructions:  Take 1 tablet (5 mg total) by mouth daily.      Follow-up Information    Timmothy Euler, MD Follow up in 3 day(s).   Specialty:  Family Medicine Why:  you need INR check. and B-met to check electrolytes,  Contact information: [place] [address] [phone]        Minus Breeding, MD Follow up.   Specialty:  Cardiology Contact information: [place] [address] [phone]          Allergies  Allergen Reactions  . Penicillins Rash and Other (See Comments)    Has patient had a PCN reaction causing immediate rash, facial/tongue/throat swelling, SOB or lightheadedness with hypotension: No Has patient had a PCN reaction causing severe rash involving mucus membranes or skin necrosis: No Has patient had a PCN reaction that required hospitalization No Has patient had a PCN reaction occurring within the last 10 years: No If all of the above answers are "NO", then may proceed with Cephalosporin use.    Consultations:  Neurology    Procedures/Studies: Dg Chest 2 View  Result Date: 03/15/2017 CLINICAL DATA:  Initial evaluation for acute TIA, left vision loss. EXAM: CHEST - 2 VIEW COMPARISON:  Prior radiograph  from 01/28/2017. FINDINGS: Cardiac and mediastinal silhouettes are stable in size and contour, and remain within normal limits. Aortic atherosclerosis. Lungs hyperinflated with attenuation of the pulmonary markings, consistent with emphysema. No focal infiltrates or edema. Blunting of the left costophrenic angle may reflect a trace left pleural effusion. No pneumothorax. Subcentimeter calcified granuloma the noted at the peripheral right midlung. Left shoulder arthroplasty partially visualized. No acute osseous abnormality. IMPRESSION: 1. Blunting of the left costophrenic angle, suggestive of small left pleural effusion. 2. No other active cardiopulmonary disease. 3. Emphysema. 4. Aortic atherosclerosis. Electronically Signed   By: Jeannine Boga M.D.   On: 03/15/2017 17:17   Ct Head Wo Contrast  Result Date: 03/15/2017 CLINICAL DATA:  LEFT vision changes, LEFT arm numbness for 30 minutes beginning at 11:30 a.m. Assess TIA. History of hypertension, hyperlipidemia and stroke. EXAM: CT HEAD WITHOUT CONTRAST TECHNIQUE: Contiguous axial images were obtained from the  base of the skull through the vertex without intravenous contrast. COMPARISON:  MRI of the head December 06, 2016 FINDINGS: BRAIN: No intraparenchymal hemorrhage, mass effect nor midline shift. The ventricles and sulci are normal for age. Mild patchy to confluent supratentorial white matter hypodensities. No acute large vascular territory infarcts. No abnormal extra-axial fluid collections. Basal cisterns are patent. VASCULAR: Mild to moderate calcific atherosclerosis of the carotid siphons. SKULL: No skull fracture. Osteopenia. Moderate temporomandibular osteoarthrosis. No significant scalp soft tissue swelling. SINUSES/ORBITS: Trace LEFT mastoid effusion.The included ocular globes and orbital contents are non-suspicious. Status post bilateral ocular lens implants. Partially imaged potential LEFT dacryocyst. OTHER: None. IMPRESSION: 1. No acute  intracranial process. 2. Stable moderate to severe chronic small vessel ischemic disease. Electronically Signed   By: Elon Alas M.D.   On: 03/15/2017 14:24   Mr Jodene Nam Head Wo Contrast  Result Date: 03/16/2017 CLINICAL DATA:  Left vision loss. Left facial and left arm numbness. EXAM: MRA HEAD WITHOUT CONTRAST TECHNIQUE: Angiographic images of the Circle of Willis were obtained using MRA technique without intravenous contrast. COMPARISON:  Brain MRI 03/15/2017 FINDINGS: Intracranial internal carotid arteries: Normal. Anterior cerebral arteries: Normal. Middle cerebral arteries: Normal. Posterior communicating arteries: Present on the right only. Posterior cerebral arteries: Normal. Basilar artery: Normal. Vertebral arteries: Left dominant. Normal. Superior cerebellar arteries: Normal. Anterior inferior cerebellar arteries: Normal. Posterior inferior cerebellar arteries: Normal. IMPRESSION: No large vessel occlusion or hemodynamically significant stenosis. Electronically Signed   By: Ulyses Jarred M.D.   On: 03/16/2017 23:06   Mr Brain Wo Contrast  Result Date: 03/15/2017 CLINICAL DATA:  Focal neuro deficit greater than 6 hours. Suspect stroke. Left vision changes. Left arm numbness. EXAM: MRI HEAD WITHOUT CONTRAST TECHNIQUE: Multiplanar, multiecho pulse sequences of the brain and surrounding structures were obtained without intravenous contrast. COMPARISON:  CT 03/15/2017.  MRI 12/06/2016 FINDINGS: Brain: Negative for acute infarct. Patchy hyperintensity throughout the cerebral white matter bilaterally unchanged from the prior MRI. Negative for hemorrhage or mass. Negative for hydrocephalus. Mild atrophy. Vascular: Normal arterial flow voids. Skull and upper cervical spine: Negative. Sinuses/Orbits: Bilateral cataract removal.  Paranasal sinuses clear Other: None IMPRESSION: No acute abnormality Atrophy and moderate chronic microvascular ischemic changes stable from the prior MRI. Electronically Signed    By: Franchot Gallo M.D.   On: 03/15/2017 17:35   US Carotid Bilateral (at [facility] Only)  Result Date: 03/15/2017 CLINICAL DATA:  84 year old female with a history of visual disturbance. Cardiovascular risk factors include hypertension, stroke/TIA, hyperlipidemia EXAM: BILATERAL CAROTID DUPLEX ULTRASOUND TECHNIQUE: Pearline Cables scale imaging, color Doppler and duplex ultrasound were performed of bilateral carotid and vertebral arteries in the neck. COMPARISON:  No prior duplex FINDINGS: Criteria: Quantification of carotid stenosis is based on velocity parameters that correlate the residual internal carotid diameter with NASCET-based stenosis levels, using the diameter of the distal internal carotid lumen as the denominator for stenosis measurement. The following velocity measurements were obtained: RIGHT ICA:  Systolic 83 cm/sec, Diastolic 19 cm/sec CCA:  60 cm/sec SYSTOLIC ICA/CCA RATIO:  1.4 ECA:  47 cm/sec LEFT ICA:  Systolic 96 cm/sec, Diastolic 24 cm/sec CCA:  70 cm/sec SYSTOLIC ICA/CCA RATIO:  1.4 ECA:  63 cm/sec Right Brachial SBP: Not acquired Left Brachial SBP: Not acquired RIGHT CAROTID ARTERY: No significant calcifications of the right common carotid artery. Intermediate waveform maintained. Heterogeneous and partially calcified plaque at the right carotid bifurcation. No significant lumen shadowing. Low resistance waveform of the right ICA. Tortuosity. RIGHT VERTEBRAL ARTERY: Antegrade flow with  low resistance waveform. LEFT CAROTID ARTERY: No significant calcifications of the left common carotid artery. Intermediate waveform maintained. Heterogeneous and partially calcified plaque at the left carotid bifurcation without significant lumen shadowing. Low resistance waveform of the left ICA. Tortuosity. LEFT VERTEBRAL ARTERY:  Antegrade flow with low resistance waveform. IMPRESSION: Color duplex indicates moderate heterogeneous and calcified plaque, with no hemodynamically significant stenosis by duplex  criteria in the extracranial cerebrovascular circulation. Signed, Dulcy Fanny. Earleen Newport, DO [facility] Electronically Signed   By: Corrie Mckusick D.O.   On: 03/15/2017 17:05   (Echo, Carotid, EGD, Colonoscopy, ERCP)    Subjective:   Discharge Exam: Vitals:   03/16/17 2312 03/17/17 0532  BP: 138/77 (!) 168/61  Pulse: 70 (!) 58  Resp: 18 16  Temp: (!) 97.5 F (36.4 C) 97.7 F (36.5 C)  SpO2: 94% 97%   Vitals:   03/16/17 0604 03/16/17 1339 03/16/17 2312 03/17/17 0532  BP: (!) 142/79 (!) 136/44 138/77 (!) 168/61  Pulse: 70 (!) 36 70 (!) 58  Resp: _0 Temp: 97.9 F (36.6 C) 97.8 F (36.6 C) (!) 97.5 F (36.4 C) 97.7 F (36.5 C)  TempSrc: Oral Oral Oral Oral  SpO2: 98% 100% 94% 97%  Weight:      Height:        General: Pt is alert, awake, not in acute distress Cardiovascular: RRR, S1/S2 +, no rubs, no gallops Respiratory: CTA bilaterally, no wheezing, no rhonchi Abdominal: Soft, NT, ND, bowel sounds + Extremities: no edema, no cyanosis    The results of significant diagnostics from this hospitalization (including imaging, microbiology, ancillary and laboratory) are listed below for reference.     Microbiology: No results found for this or any previous visit (from the past 240 hour(s)).   Labs: BNP (last 3 results) No results for input(s): BNP in the last 8760 hours. Basic Metabolic Panel: Recent Labs  Lab 03/15/17 1320 03/16/17 0717 03/16/17 1739  NA 131* 134* 134*  K 5.0 3.9 4.2  CL 93* 98* 99*  CO2 _1 GLUCOSE 97 100* 105*  BUN _2 CREATININE 0.83 0.84 0.77  CALCIUM 8.6* 8.4* 8.5*  MG  --   --  2.1   Liver Function Tests: Recent Labs  Lab 03/15/17 1320  AST 19  ALT 11*  ALKPHOS 56  BILITOT 0.7  PROT 6.7  ALBUMIN 3.6   Recent Labs  Lab 03/16/17 1739  LIPASE 26   No results for input(s): AMMONIA in the last 168 hours. CBC: Recent Labs  Lab 03/15/17 1320  WBC 5.8   NEUTROABS 3.4  HGB 12.0  HCT 37.6  MCV 94.9  PLT 243   Cardiac Enzymes: No results for input(s): CKTOTAL, CKMB, CKMBINDEX, TROPONINI in the last 168 hours. BNP: Invalid input(s): POCBNP CBG: No results for input(s): GLUCAP in the last 168 hours. D-Dimer No results for input(s): DDIMER in the last 72 hours. Hgb A1c Recent Labs    03/16/17 1429  HGBA1C 5.3   Lipid Profile Recent Labs    03/17/17 0337  CHOL 154  HDL 80  LDLCALC 62  TRIG 62  CHOLHDL 1.9   Thyroid function studies No results for input(s): TSH, T4TOTAL, T3FREE, THYROIDAB in the last 72 hours.  Invalid input(s): FREET3 Anemia work up No results for input(s): VITAMINB12, FOLATE, FERRITIN, TIBC, IRON, RETICCTPCT in the last 72 hours. Urinalysis    Component Value Date/Time   COLORURINE YELLOW 03/15/2017 1256  APPEARANCEUR CLEAR 03/15/2017 1256   APPEARANCEUR Clear 11/01/2016 1550   LABSPEC 1.008 03/15/2017 1256   PHURINE 5.0 03/15/2017 1256   GLUCOSEU NEGATIVE 03/15/2017 1256   Media 03/15/2017 [place] 03/15/2017 1256   BILIRUBINUR Negative 11/01/2016 [place] 03/15/2017 1256   PROTEINUR NEGATIVE 03/15/2017 1256   UROBILINOGEN 0.2 11/09/2010 1235   NITRITE NEGATIVE 03/15/2017 1256   LEUKOCYTESUR NEGATIVE 03/15/2017 1256   LEUKOCYTESUR Negative 11/01/2016 1550   Sepsis Labs Invalid input(s): PROCALCITONIN,  WBC,  LACTICIDVEN Microbiology No results found for this or any previous visit (from the past 240 hour(s)).   Time coordinating discharge: Over 30 minutes  SIGNED:   Elmarie Shiley, MD  [facility] 03/17/2017, 10:32 AM Pager   If 7PM-7AM, please contact night-coverage [email or web address] Password [email or web address]

## 2017-03-17 NOTE — Plan of Care (Signed)
  Progressing Education: Knowledge of General Education information will improve 03/17/2017 1015 - Progressing by Jenne Campus, RN Clinical Measurements: Ability to maintain clinical measurements within normal limits will improve 03/17/2017 1015 - Progressing by Jenne Campus, RN Will remain free from infection 03/17/2017 1015 - Progressing by Jenne Campus, RN Respiratory complications will improve 03/17/2017 1015 - Progressing by Jenne Campus, RN Cardiovascular complication will be avoided 03/17/2017 1015 - Progressing by Jenne Campus, RN Activity: Risk for activity intolerance will decrease 03/17/2017 1015 - Progressing by Jenne Campus, RN Nutrition: Adequate nutrition will be maintained 03/17/2017 1015 - Progressing by Jenne Campus, RN Elimination: Will not experience complications related to bowel motility 03/17/2017 1015 - Progressing by Jenne Campus, RN Will not experience complications related to urinary retention 03/17/2017 1015 - Progressing by Jenne Campus, RN Skin Integrity: Risk for impaired skin integrity will decrease 03/17/2017 1015 - Progressing by Jenne Campus, RN

## 2017-03-17 NOTE — Progress Notes (Signed)
[name] to be D/C'd Home per MD order.  Discussed with the patient and all questions fully answered.  VSS, Skin clean, dry and intact without evidence of skin break down, no evidence of skin tears noted. IV catheter discontinued intact. Site without signs and symptoms of complications. Dressing and pressure applied.  An After Visit Summary was printed and given to the patient. Patient received prescription.  D/c education completed with patient/family including follow up instructions, medication list, d/c activities limitations if indicated, with other d/c instructions as indicated by MD - patient able to verbalize understanding, all questions fully answered.   Patient instructed to return to ED, call 911, or call MD for any changes in condition.   Patient escorted via [place], and D/C home via private auto. Allergies as of 03/17/2017      Reactions   Penicillins Rash, Other (See Comments)   Has patient had a PCN reaction causing immediate rash, facial/tongue/throat swelling, SOB or lightheadedness with hypotension: No Has patient had a PCN reaction causing severe rash involving mucus membranes or skin necrosis: No Has patient had a PCN reaction that required hospitalization No Has patient had a PCN reaction occurring within the last 10 years: No If all of the above answers are "NO", then may proceed with Cephalosporin use.      Medication List    STOP taking these medications   atenolol 50 MG tablet Commonly known as:  TENORMIN   furosemide 40 MG tablet Commonly known as:  LASIX     TAKE these medications   ARTIFICIAL TEAR OP Apply 2 drops to eye daily as needed (Dryness). To the left eye   atorvastatin 20 MG tablet Commonly known as:  LIPITOR TAKE 1 TABLET BY MOUTH AT  BEDTIME   flecainide 50 MG tablet Commonly known as:  TAMBOCOR Take 0.5 tablets (25 mg total) by mouth 2 (two) times daily.   irbesartan 150 MG tablet Commonly known as:  AVAPRO Take 1 tablet (150 mg  total) by mouth daily.   levETIRAcetam 250 MG tablet Commonly known as:  KEPPRA Take 0.5 tablets (125 mg total) by mouth 2 (two) times daily.   levothyroxine 50 MCG tablet Commonly known as:  SYNTHROID, LEVOTHROID Take 50 mcg by mouth daily before breakfast.   mirtazapine 15 MG tablet Commonly known as:  REMERON TAKE ONE TABLET AT BEDTIME   pantoprazole 40 MG tablet Commonly known as:  PROTONIX TAKE 1 TABLET DAILY   potassium chloride SA 20 MEQ tablet Commonly known as:  K-DUR,KLOR-CON Take 0.5 tablets (10 mEq total) by mouth daily. What changed:  when to take this   TYLENOL 325 MG Caps Generic drug:  Acetaminophen Take 650 mg by mouth every 6 (six) hours as needed for mild pain.   warfarin 5 MG tablet Commonly known as:  COUMADIN Take as directed. If you are unsure how to take this medication, talk to your nurse or doctor. Original instructions:  Take 1 tablet (5 mg total) by mouth daily.      [name] 03/17/2017 2:07 PM

## 2017-03-18 ENCOUNTER — Other Ambulatory Visit: Payer: Self-pay | Admitting: Family Medicine

## 2017-03-21 ENCOUNTER — Ambulatory Visit (INDEPENDENT_AMBULATORY_CARE_PROVIDER_SITE_OTHER): Payer: Medicare Other | Admitting: Family Medicine

## 2017-03-21 ENCOUNTER — Encounter: Payer: Self-pay | Admitting: Family Medicine

## 2017-03-21 VITALS — BP 152/85 | HR 76 | Temp 96.9°F | Ht 62.0 in | Wt 138.6 lb

## 2017-03-21 DIAGNOSIS — I480 Paroxysmal atrial fibrillation: Secondary | ICD-10-CM

## 2017-03-21 DIAGNOSIS — G459 Transient cerebral ischemic attack, unspecified: Secondary | ICD-10-CM | POA: Diagnosis not present

## 2017-03-21 DIAGNOSIS — I10 Essential (primary) hypertension: Secondary | ICD-10-CM

## 2017-03-21 NOTE — Progress Notes (Signed)
   HPI  Patient presents today here for hospital follow-up.  Patient was seen in the hospital on March 12 for TIA.  She had left eye decreased vision, left arm and face numbness.  Her symptoms resolved after about 20 minutes.  She reports good medication compliance is being home.  Denies any bleeding or racing heart.  Patient's beta-blocker was stopped due to heart rate in the 40s.   PMH: Smoking status noted ROS: Per HPI  Objective: BP (!) 152/85   Pulse 76   Temp (!) 96.9 F (36.1 C) (Oral)   Ht _0  (1.575 m)   Wt 138 lb 9.6 oz (62.9 kg)   BMI 25.35 kg/m  Gen: NAD, alert, cooperative with exam HEENT: NCAT, EOMI, PERRL CV: RRR, good S1/S2 Resp: CTABL, no wheezes, non-labored Ext: No edema, warm Neuro: Alert and oriented, No gross deficits  Assessment and plan:  #Hypertension Slightly elevated today Continue to monitor closely Follow-up 4 weeks  #Atrial fibrillation Patient managed primarily by cardiology, on flecainide Continue Coumadin, INR was 3.0 previous to leaving the hospital. Recheck INR in 4 weeks  #TIA Symptoms completely resolved Possibly due to subtherapeutic INR     Orders Placed This Encounter  Procedures  . CMP14+EGFR  . CBC with [place], MD [facility] 03/21/2017, 1:42 PM

## 2017-03-21 NOTE — Patient Instructions (Signed)
Great to see you!   

## 2017-03-22 LAB — CBC WITH DIFFERENTIAL/PLATELET
BASOS ABS: 0 10*3/uL (ref 0.0–0.2)
Basos: 1 %
EOS (ABSOLUTE): 0.2 10*3/uL (ref 0.0–0.4)
Eos: 4 %
HEMOGLOBIN: 12.4 g/dL (ref 11.1–15.9)
Hematocrit: 37.3 % (ref 34.0–46.6)
IMMATURE GRANS (ABS): 0 10*3/uL (ref 0.0–0.1)
Immature Granulocytes: 0 %
LYMPHS ABS: 1.2 10*3/uL (ref 0.7–3.1)
LYMPHS: 26 %
MCH: 31.2 pg (ref 26.6–33.0)
MCHC: 33.2 g/dL (ref 31.5–35.7)
MCV: 94 fL (ref 79–97)
MONOCYTES: 13 %
Monocytes Absolute: 0.6 10*3/uL (ref 0.1–0.9)
Neutrophils Absolute: 2.7 10*3/uL (ref 1.4–7.0)
Neutrophils: 56 %
PLATELETS: 271 10*3/uL (ref 150–379)
RBC: 3.98 x10E6/uL (ref 3.77–5.28)
RDW: 14.2 % (ref 12.3–15.4)
WBC: 4.8 10*3/uL (ref 3.4–10.8)

## 2017-03-22 LAB — CMP14+EGFR
ALK PHOS: 59 IU/L (ref 39–117)
ALT: 13 IU/L (ref 0–32)
AST: 19 IU/L (ref 0–40)
Albumin/Globulin Ratio: 1.6 (ref 1.2–2.2)
Albumin: 4.1 g/dL (ref 3.5–4.7)
BILIRUBIN TOTAL: 0.4 mg/dL (ref 0.0–1.2)
BUN/Creatinine Ratio: 15 (ref 12–28)
BUN: 10 mg/dL (ref 8–27)
CO2: 24 mmol/L (ref 20–29)
Calcium: 8.8 mg/dL (ref 8.7–10.3)
Chloride: 94 mmol/L — ABNORMAL LOW (ref 96–106)
Creatinine, Ser: 0.66 mg/dL (ref 0.57–1.00)
GFR calc Af Amer: 94 mL/min/{1.73_m2} (ref >59)
GFR calc non Af Amer: 82 mL/min/{1.73_m2} (ref >59)
Globulin, Total: 2.5 g/dL (ref 1.5–4.5)
Glucose: 79 mg/dL (ref 65–99)
Potassium: 4.2 mmol/L (ref 3.5–5.2)
Sodium: 137 mmol/L (ref 134–144)
Total Protein: 6.6 g/dL (ref 6.0–8.5)

## 2017-03-23 ENCOUNTER — Ambulatory Visit: Payer: Medicare Other | Admitting: Cardiology

## 2017-04-12 ENCOUNTER — Other Ambulatory Visit: Payer: Self-pay | Admitting: Family Medicine

## 2017-04-12 ENCOUNTER — Ambulatory Visit: Payer: Medicare Other | Admitting: Adult Health

## 2017-04-13 DIAGNOSIS — Z961 Presence of intraocular lens: Secondary | ICD-10-CM | POA: Diagnosis not present

## 2017-04-13 DIAGNOSIS — H26493 Other secondary cataract, bilateral: Secondary | ICD-10-CM | POA: Diagnosis not present

## 2017-04-13 DIAGNOSIS — H353131 Nonexudative age-related macular degeneration, bilateral, early dry stage: Secondary | ICD-10-CM | POA: Diagnosis not present

## 2017-04-13 DIAGNOSIS — H16142 Punctate keratitis, left eye: Secondary | ICD-10-CM | POA: Diagnosis not present

## 2017-04-14 ENCOUNTER — Other Ambulatory Visit: Payer: Self-pay | Admitting: Family Medicine

## 2017-04-14 NOTE — Telephone Encounter (Signed)
Last seen 03/21/17  Dr Wendi Snipes

## 2017-04-19 ENCOUNTER — Ambulatory Visit (INDEPENDENT_AMBULATORY_CARE_PROVIDER_SITE_OTHER): Payer: Medicare Other | Admitting: Family Medicine

## 2017-04-19 ENCOUNTER — Encounter: Payer: Self-pay | Admitting: Family Medicine

## 2017-04-19 VITALS — BP 157/56 | HR 60 | Temp 96.4°F | Ht 62.0 in | Wt 142.0 lb

## 2017-04-19 DIAGNOSIS — I480 Paroxysmal atrial fibrillation: Secondary | ICD-10-CM | POA: Diagnosis not present

## 2017-04-19 LAB — COAGUCHEK XS/INR WAIVED
INR: 5.2 — AB (ref 0.9–1.1)
Prothrombin Time: 62.4 s

## 2017-04-19 NOTE — Progress Notes (Signed)
   HPI  Patient presents today here to discuss surgical clearance as well as anticoagulation.  Patient has history of atrial flutter, she reports taking one  5 mg tablet of Coumadin daily, she states that she may have had a missed pill but she does not remember.  She denies any bleeding. She denies any diet changes  Patient is planning to have cataract surgery on her left eye, she is previously had a known her right eye  PMH: Smoking status noted ROS: Per HPI  Objective: BP (!) 157/56 (BP Location: Right Arm)   Pulse 60   Temp (!) 96.4 F (35.8 C) (Oral)   Ht '5\' 2"'$  (1.575 m)   Wt 142 lb (64.4 kg)   SpO2 99%   BMI 25.97 kg/m  Gen: NAD, alert, cooperative with exam HEENT: NCAT CV: regular rate, irreg irreg rhythm Resp: CTABL, no wheezes, non-labored Ext: No edema, warm Neuro: Alert and oriented, No gross deficits  Assessment and plan:  #Paroxysmal atrial fibrillation, chronic anticoagulation Rate controlled, does sound irregular on auscultation patient with some bradycardia when she first presented which is now back to normal rate, denies symptoms of bradycardia She has follow-up with cardiology coming up in about 3 weeks. INR supratheraputic at 5.2, repeated 5.1  Decreased total dose of Coumadin from 35 mg weekly to 30 mg weekly, hold times 2 days Discussed red flags and reasons to seek emergency medical care including any bleeding that is not controlled within 5 minutes and any rectal bleeding or injury to the head. Patient does not have any active bleeding and denies any recent falls.   Explained she is ok to have cataract surgery without stopping anticoagulation. This is not scheduled for a few weeks.    Orders Placed This Encounter  Procedures  . CoaguChek XS/INR Waived  . Protime-INR  . CBC with Differential/Platelet  . [place], MD [facility] 04/19/2017, 3:38 PM

## 2017-04-19 NOTE — Patient Instructions (Signed)
Great to see you!  Hold the coumadin for 2 days then restart with 1/2 tab on Monday and Friday and 1 tab on other days.

## 2017-04-20 LAB — CBC WITH DIFFERENTIAL/PLATELET
BASOS ABS: 0 10*3/uL (ref 0.0–0.2)
Basos: 1 %
EOS (ABSOLUTE): 0.1 10*3/uL (ref 0.0–0.4)
Eos: 2 %
Hematocrit: 35.7 % (ref 34.0–46.6)
Hemoglobin: 11.5 g/dL (ref 11.1–15.9)
IMMATURE GRANULOCYTES: 0 %
Immature Grans (Abs): 0 10*3/uL (ref 0.0–0.1)
LYMPHS ABS: 1.7 10*3/uL (ref 0.7–3.1)
Lymphs: 29 %
MCH: 30.3 pg (ref 26.6–33.0)
MCHC: 32.2 g/dL (ref 31.5–35.7)
MCV: 94 fL (ref 79–97)
Monocytes Absolute: 0.8 10*3/uL (ref 0.1–0.9)
Monocytes: 14 %
NEUTROS PCT: 54 %
Neutrophils Absolute: 3.3 10*3/uL (ref 1.4–7.0)
PLATELETS: 305 10*3/uL (ref 150–379)
RBC: 3.8 x10E6/uL (ref 3.77–5.28)
RDW: 14.7 % (ref 12.3–15.4)
WBC: 5.9 10*3/uL (ref 3.4–10.8)

## 2017-04-20 LAB — CMP14+EGFR
ALT: 25 IU/L (ref 0–32)
AST: 30 IU/L (ref 0–40)
Albumin/Globulin Ratio: 1.4 (ref 1.2–2.2)
Albumin: 3.9 g/dL (ref 3.5–4.7)
Alkaline Phosphatase: 63 IU/L (ref 39–117)
BUN/Creatinine Ratio: 21 (ref 12–28)
BUN: 14 mg/dL (ref 8–27)
Bilirubin Total: 0.3 mg/dL (ref 0.0–1.2)
CO2: 24 mmol/L (ref 20–29)
Calcium: 8.5 mg/dL — ABNORMAL LOW (ref 8.7–10.3)
Chloride: 99 mmol/L (ref 96–106)
Creatinine, Ser: 0.66 mg/dL (ref 0.57–1.00)
GFR calc Af Amer: 94 mL/min/{1.73_m2} (ref >59)
GFR calc non Af Amer: 82 mL/min/{1.73_m2} (ref >59)
Globulin, Total: 2.7 g/dL (ref 1.5–4.5)
Glucose: 109 mg/dL — ABNORMAL HIGH (ref 65–99)
Potassium: 4.6 mmol/L (ref 3.5–5.2)
Sodium: 135 mmol/L (ref 134–144)
Total Protein: 6.6 g/dL (ref 6.0–8.5)

## 2017-04-20 LAB — PROTIME-INR
INR: 5.2 (ref 0.8–1.2)
Prothrombin Time: 49.5 s — ABNORMAL HIGH (ref 9.1–12.0)

## 2017-04-21 ENCOUNTER — Encounter: Payer: Self-pay | Admitting: Family Medicine

## 2017-04-21 ENCOUNTER — Ambulatory Visit (INDEPENDENT_AMBULATORY_CARE_PROVIDER_SITE_OTHER): Payer: Medicare Other | Admitting: Family Medicine

## 2017-04-21 VITALS — BP 164/82 | HR 82 | Temp 97.2°F | Ht 62.0 in | Wt 141.4 lb

## 2017-04-21 DIAGNOSIS — M545 Low back pain, unspecified: Secondary | ICD-10-CM

## 2017-04-21 DIAGNOSIS — Z7901 Long term (current) use of anticoagulants: Secondary | ICD-10-CM | POA: Diagnosis not present

## 2017-04-21 LAB — COAGUCHEK XS/INR WAIVED
INR: 1.7 — ABNORMAL HIGH (ref 0.9–1.1)
PROTHROMBIN TIME: 20.4 s

## 2017-04-21 MED ORDER — TRAMADOL HCL 50 MG PO TABS: 50.0000 mg | ORAL_TABLET | Freq: Three times a day (TID) | ORAL | 0 refills | Status: DC | PRN

## 2017-04-21 NOTE — Progress Notes (Signed)
   HPI  Patient presents today with back pain.  Patient explains she has had bilateral low back pain that is radiating down the bilateral anterior legs. Is worse with sitting for long time or walking a few steps.  She states she is taking Tylenol with some improvement.  Coumadin anticoagulation Patient had supratherapeutic INR few days ago, denies any bleeding.   PMH: Smoking status noted ROS: Per HPI  Objective: BP (!) 164/82   Pulse 82   Temp (!) 97.2 F (36.2 C) (Oral)   Ht 5\' 2"  (1.575 m)   Wt 141 lb 6.4 oz (64.1 kg)   BMI 25.86 kg/m  Gen: NAD, alert, cooperative with exam HEENT: NCAT CV: RRR, good S1/S2, no murmur Resp: CTABL, no wheezes, non-labored Ext: No edema, warm Neuro: Alert and oriented, No gross deficits MSK: TTP over BL SI joints, NO Midline ttp. Negative straight leg rasie  Assessment and plan:  #Low back pain Bilateral low back pain over the SI joints with some radiculopathy Limited options given her age She has tolerated tramadol well in the past, given tramadol again.  #Coumadin anticoagulation Recent supratherapeutic INR No Bleeding INR 1.7, retrun to reduced dosing., 30 mg weekly    Orders Placed This Encounter  Procedures  . CoaguChek XS/INR Waived    Meds ordered this encounter  Medications  . traMADol (ULTRAM) 50 MG tablet    Sig: Take 1 tablet (50 mg total) by mouth every 8 (eight) hours as needed.    Dispense:  30 tablet    Refill:  0    Laroy Apple, MD [facility] 04/21/2017, 5:06 PM

## 2017-04-21 NOTE — Patient Instructions (Addendum)
   Take 1 tablet of coumadin except on mondays and fridays when you take 1/2   Come back in 2-3 weeks for INR recheck

## 2017-04-27 ENCOUNTER — Encounter: Payer: Self-pay | Admitting: Cardiology

## 2017-05-04 ENCOUNTER — Other Ambulatory Visit: Payer: Self-pay

## 2017-05-05 ENCOUNTER — Other Ambulatory Visit: Payer: Self-pay

## 2017-05-05 ENCOUNTER — Emergency Department (HOSPITAL_COMMUNITY)
Admission: EM | Admit: 2017-05-05 | Discharge: 2017-05-05 | Disposition: A | Discharge status: Home / Self Care | Payer: Medicare Other | Attending: Emergency Medicine | Admitting: Emergency Medicine

## 2017-05-05 ENCOUNTER — Encounter (HOSPITAL_COMMUNITY): Payer: Self-pay

## 2017-05-05 DIAGNOSIS — Z96612 Presence of left artificial shoulder joint: Secondary | ICD-10-CM | POA: Diagnosis not present

## 2017-05-05 DIAGNOSIS — E039 Hypothyroidism, unspecified: Secondary | ICD-10-CM | POA: Insufficient documentation

## 2017-05-05 DIAGNOSIS — I5032 Chronic diastolic (congestive) heart failure: Secondary | ICD-10-CM | POA: Insufficient documentation

## 2017-05-05 DIAGNOSIS — I480 Paroxysmal atrial fibrillation: Secondary | ICD-10-CM | POA: Insufficient documentation

## 2017-05-05 DIAGNOSIS — Z8673 Personal history of transient ischemic attack (TIA), and cerebral infarction without residual deficits: Secondary | ICD-10-CM | POA: Insufficient documentation

## 2017-05-05 DIAGNOSIS — R531 Weakness: Secondary | ICD-10-CM | POA: Diagnosis not present

## 2017-05-05 DIAGNOSIS — Z96653 Presence of artificial knee joint, bilateral: Secondary | ICD-10-CM | POA: Diagnosis not present

## 2017-05-05 DIAGNOSIS — Z79899 Other long term (current) drug therapy: Secondary | ICD-10-CM | POA: Insufficient documentation

## 2017-05-05 DIAGNOSIS — Z9049 Acquired absence of other specified parts of digestive tract: Secondary | ICD-10-CM | POA: Diagnosis not present

## 2017-05-05 DIAGNOSIS — R000 Tachycardia, unspecified: Secondary | ICD-10-CM | POA: Diagnosis present

## 2017-05-05 DIAGNOSIS — I110 Hypertensive heart disease with heart failure: Secondary | ICD-10-CM | POA: Diagnosis not present

## 2017-05-05 DIAGNOSIS — Z7901 Long term (current) use of anticoagulants: Secondary | ICD-10-CM | POA: Diagnosis not present

## 2017-05-05 NOTE — ED Provider Notes (Signed)
[facility] EMERGENCY DEPARTMENT Provider Note   CSN: [id number] Arrival date & time: 05/05/17  9211  Time seen 04:35 AM   History   Chief Complaint Chief Complaint  Patient presents with  . Weakness    HPI [name] is a 84 y.o. female.  HPI patient has a history of paroxysmal atrial fibrillation, on Coumadin, recent TIA in January presents stating that yesterday, May 1 she felt fine all day although she was tired.  She states she was laying in bed trying to go to sleep and she could not.  After going through a lot of symptoms it finally was revealed that she felt like her heart was racing.  She states that started about 1 PM and continued and it kept her from falling asleep tonight.  She states she did take 4 baby aspirin and an extra half of her flecainide about 2:45 AM.  She states when she got to the ED she could tell she was still having some skipped beats but that is improving.  She states she did not have a cough, sore throat, rhinorrhea, nausea, vomiting, diarrhea, chest pain, shortness of breath, headache, numbness or tingling in her face or extremities, or swelling of her legs.  She states when she took her blood pressure at home it was very high.  Patient was noted to have some bright green skin on her left lower leg and she states she put a roll on medication on it.  She states it was clear when it rolled on.  She denies wearing any green clothing.  It looks like a green dye.  PCP Timmothy Euler, MD Cardiology Dr Percival Spanish  Past Medical History:  Diagnosis Date  . Anemia   . Atrial fibrillation (HCC)    Paroxysmal, Flecainide therapy  . Calf pain    September, 2012, at rest  . Cardiomegaly    EF normal, echo, 2003  //   EF 60-65%, echo, June 22, 9415, diastolic dysfunction,  . Carotid bruit    Doppler, December, 2009, no abnormality  . Cataract   . Diverticulosis   . GERD (gastroesophageal reflux disease)   . History of shingles   . HLD (hyperlipidemia)     . HTN (hypertension)   . Hypothyroidism   . Hypothyroidism   . Insomnia   . Normal nuclear stress test    Normal, 2001  . Osteoarthritis   . PONV (postoperative nausea and vomiting)   . Rectal bleeding 2001   diverticulosis and int hemorrhoids on 07/1999 and 02/2010 colonoscopies.  . Stroke ([place])   . Warfarin anticoagulation     Patient Active Problem List   Diagnosis Date Noted  . TIA (transient ischemic attack) 03/15/2017  . Osteoporosis 02/24/2017  . Prolonged QT interval 01/28/2017  . Abnormal nuclear cardiac imaging test   . Unstable angina ([place]) 06/16/2016  . Atrial flutter ([place]) 06/16/2016  . Abnormal stress test 06/16/2016  . Frequent urination 05/10/2016  . Acute cystitis with hematuria 05/10/2016  . Chest pain 04/23/2016  . Hypomagnesemia 04/23/2016  . AP (abdominal pain)   . Melena 09/23/2015  . Overactive bladder 05/02/2015  . Acute cerebrovascular accident (CVA) ([place]) 04/21/2015  . Acute cardioembolic stroke ([place]) 40/81/4481  . Healthcare maintenance 02/20/2015  . History of dysarthria 02/20/2015  . Enteritis due to Clostridium difficile 01/27/2015  . HTN (hypertension) 01/11/2015  . Hyponatremia 01/11/2015  . Rectal bleeding 01/11/2015  . Chronic pain syndrome 11/25/2014  . Insomnia 11/25/2014  . Chronic diastolic  CHF (congestive heart failure) ([place]) 11/12/2014  . Paroxysmal atrial fibrillation ([place]) 10/14/2014  . Warfarin anticoagulation   . Hypothyroidism   . Carotid bruit   . Normal nuclear stress test   . S/P knee replacement   . UNSPECIFIED ANEMIA 01/19/2010  . DIVERTICULOSIS-COLON 01/19/2010    Past Surgical History:  Procedure Laterality Date  . BIOPSY  09/29/2015   Procedure: BIOPSY;  Surgeon: Rogene Houston, MD;  [facility]: [facility];  Service: Endoscopy;;  gastric  . CATARACT EXTRACTION W/PHACO Right 04/15/2014   Procedure: CATARACT EXTRACTION PHACO AND INTRAOCULAR LENS PLACEMENT (IOC);  Surgeon: Tonny Branch, MD;  Location: [facility] ORS;   Service: Ophthalmology;  Laterality: Right;  CDE: 13.30  . CATARACT EXTRACTION W/PHACO Left 05/13/2014   Procedure: CATARACT EXTRACTION PHACO AND INTRAOCULAR LENS PLACEMENT LEFT EYE;  Surgeon: Tonny Branch, MD;  Location: [facility];  Service: Ophthalmology;  Laterality: Left;  CDE:13.00  . CHOLECYSTECTOMY N/A 01/15/2015   Procedure: LAPAROSCOPIC CHOLECYSTECTOMY;  Surgeon: Ralene Ok, MD;  Location: [facility];  Service: General;  Laterality: N/A;  . COLONOSCOPY N/A 09/29/2015   Procedure: COLONOSCOPY;  Surgeon: Rogene Houston, MD;  [facility]: [facility];  Service: Endoscopy;  Laterality: N/A;  . COLONOSCOPY    . ESOPHAGOGASTRODUODENOSCOPY N/A 01/13/2015   Procedure: ESOPHAGOGASTRODUODENOSCOPY (EGD);  Surgeon: Jerene Bears, MD;  Location: [facility] ENDOSCOPY;  Service: Endoscopy;  Laterality: N/A;  . ESOPHAGOGASTRODUODENOSCOPY N/A 09/29/2015   Procedure: ESOPHAGOGASTRODUODENOSCOPY (EGD);  Surgeon: Rogene Houston, MD;  [facility];  Service: Endoscopy;  Laterality: N/A;  12:15  . GALLBLADDER SURGERY    . LEFT HEART CATH AND CORONARY ANGIOGRAPHY N/A 06/22/2016   Procedure: Left Heart Cath and Coronary Angiography;  Surgeon: Troy Sine, MD;  Location: [facility];  Service: Cardiovascular;  Laterality: N/A;  . SHOULDER OPEN ROTATOR CUFF REPAIR     bilateral  . TOTAL KNEE ARTHROPLASTY  05/26/10   right  . TOTAL KNEE ARTHROPLASTY  11/17/2010   Procedure: TOTAL KNEE ARTHROPLASTY;  Surgeon: Mauri Pole;  Location: [facility] OR[facility];  Service: Orthopedics;  Laterality: Left;  . TOTAL KNEE ARTHROPLASTY     Right  . TOTAL SHOULDER REPLACEMENT  01/2010   left     OB History    Gravida  1   Para  1   Term  1   Preterm      AB      Living        SAB      TAB      Ectopic      Multiple      Live Births               Home Medications    Prior to Admission medications   Medication Sig Start Date End Date Taking? Authorizing Provider  Acetaminophen (TYLENOL) 325 MG CAPS Take  650 mg by mouth every 6 (six) hours as needed for mild pain.    [provider]  ARTIFICIAL TEAR OP Apply 2 drops to eye daily as needed ([place]). To the left eye    [provider]  atorvastatin (LIPITOR) 20 MG tablet TAKE 1 TABLET BY MOUTH AT  BEDTIME 10/08/16   Timmothy Euler, MD  flecainide (TAMBOCOR) 50 MG tablet Take 0.5 tablets (25 mg total) by mouth 2 (two) times daily. 03/17/17   Regalado, Belkys A, MD  irbesartan (AVAPRO) 150 MG tablet Take 1 tablet (150 mg total) by mouth daily. 03/10/17   Wendi Snipes,  Sherley Bounds, MD  levETIRAcetam (KEPPRA) 250 MG tablet TAKE 1 TABLET BY MOUTH TWO  TIMES DAILY 04/14/17   Timmothy Euler, MD  levothyroxine (SYNTHROID, LEVOTHROID) 50 MCG tablet Take 1 tablet daily before breakfast 03/18/17   Timmothy Euler, MD  mirtazapine (REMERON) 15 MG tablet TAKE ONE TABLET AT BEDTIME 02/17/17   Timmothy Euler, MD  pantoprazole (PROTONIX) 40 MG tablet TAKE 1 TABLET DAILY 01/11/17   Timmothy Euler, MD  potassium chloride SA (K-DUR,KLOR-CON) 20 MEQ tablet Take 0.5 tablets (10 mEq total) by mouth daily. 03/17/17   Regalado, Belkys A, MD  traMADol (ULTRAM) 50 MG tablet Take 1 tablet (50 mg total) by mouth every 8 (eight) hours as needed. 04/21/17   Timmothy Euler, MD  warfarin (COUMADIN) 5 MG tablet Take 1 tablet (5 mg total) by mouth daily. 03/17/17   Regalado, Cassie Freer, MD    Family History Family History  Problem Relation Age of Onset  . COPD Brother   . Atrial fibrillation Brother   . Diabetes Brother   . Liver cancer Brother        Alcohol-related  . Colon cancer Brother   . Heart attack Father   . Early death Father   . Rheum arthritis Mother   . Arthritis Mother   . Heart disease Mother   . Coronary artery disease Brother   . Heart attack Daughter   . Gallbladder disease Brother     Social History Social History   Tobacco Use  . Smoking status: Never Smoker  . Smokeless tobacco: Never Used  Substance Use Topics  .  Alcohol use: No  . Drug use: No  lives at home Lives with spouse   Allergies   Penicillins   Review of Systems Review of Systems  All other systems reviewed and are negative.    Physical Exam Updated Vital Signs BP (!) 135/102   Pulse 68   Temp 97.9 F (36.6 C) (Oral)   Resp 18   Ht 5\' 2"  (1.575 m)   Wt 63.5 kg (140 lb)   SpO2 98%   BMI 25.61 kg/m   Physical Exam  Constitutional: She is oriented to person, place, and time. She appears well-developed and well-nourished.  Non-toxic appearance. She does not appear ill. No distress.  HENT:  Head: Normocephalic and atraumatic.  Right Ear: External ear normal.  Left Ear: External ear normal.  Nose: Nose normal. No mucosal edema or rhinorrhea.  Mouth/Throat: Oropharynx is clear and moist and mucous membranes are normal. No dental abscesses or uvula swelling.  Eyes: Pupils are equal, round, and reactive to light. Conjunctivae and EOM are normal.  Neck: Normal range of motion and full passive range of motion without pain. Neck supple.  Cardiovascular: Normal rate, regular rhythm and normal heart sounds.  Occasional extrasystoles are present. Exam reveals no gallop and no friction rub.  No murmur heard. Pulmonary/Chest: Effort normal and breath sounds normal. No respiratory distress. She has no wheezes. She has no rhonchi. She has no rales. She exhibits no tenderness and no crepitus.  Abdominal: Soft. Normal appearance and bowel sounds are normal. She exhibits no distension. There is no tenderness. There is no rebound and no guarding.  Musculoskeletal: Normal range of motion. She exhibits no edema or tenderness.  Moves all extremities well.   Neurological: She is alert and oriented to person, place, and time. She has normal strength. No cranial nerve deficit.  Skin: Skin is warm, dry and intact. No  rash noted. No erythema. No pallor.  Patient has splotchy bright green areas on her left lower leg, these are obviously dye stains,  not any pathological skin problem.  Psychiatric: She has a normal mood and affect. Her speech is normal and behavior is normal. Her mood appears not anxious.  Nursing note and vitals reviewed.    ED Treatments / Results  Labs (all labs ordered are listed, but only abnormal results are displayed) Labs Reviewed - No data to display  EKG EKG Interpretation  Date/Time:  Thursday May 05 2017 03:39:44 EDT Ventricular Rate:  71 PR Interval:    QRS Duration: 99 QT Interval:  411 QTC Calculation: 447 R Axis:   -24 Text Interpretation:  Sinus or ectopic atrial rhythm Multiple ventricular premature complexes Prolonged PR interval Borderline left axis deviation Anterior infarct, old No significant change since last tracing 17 Mar 2017 Confirmed by Rolland Porter [phone]) on 05/05/2017 3:43:19 AM   Radiology No results found.  Procedures Procedures (including critical care time)  Medications Ordered in ED Medications - No data to display   Initial Impression / Assessment and Plan / ED Course  I have reviewed the triage vital signs and the nursing notes.  Pertinent labs & imaging results that were available during my care of the patient were reviewed by me and considered in my medical decision making (see chart for details).     At the time of my exam patient's blood pressure was 135/102.  She states she normally takes her blood pressure medication at 8 AM, irbesartan.  Her heart rate was in the 70s and when I first start talking to her she was having frequent PVCs about every 5-10 beats however during the course of our conversation there would be long periods without having a PVC.  At this point I discussed with the patient I felt like she had already treated herself at home by taking the 4 baby aspirin and the extra half dose of the flecainide.  She was discharged home.    Final Clinical Impressions(s) / ED Diagnoses   Final diagnoses:  Paroxysmal atrial fibrillation [facility])    ED  Discharge Orders    None      Plan discharge  Rolland Porter, MD, Barbette Or, MD 05/05/17 [phone]

## 2017-05-05 NOTE — ED Triage Notes (Signed)
Pt states she was restless when she lay down for bed, states she got up and checked her blood pressure and it was 200/100, states she "feels funny in my head"  But pt denies pain or dizziness.  Pt states she feels a little weak all over.

## 2017-05-05 NOTE — Discharge Instructions (Addendum)
You treated your atrial fibrillation tonight by taking the extra 1/2 dose of flecainide. Return to the ED if you feel worse again. Let Dr Hochrein's office know you had another episode of the atrial fibrillation tonight and you took the extra flecainide to treat it.

## 2017-05-06 ENCOUNTER — Other Ambulatory Visit: Payer: Self-pay | Admitting: *Deleted

## 2017-05-06 NOTE — Telephone Encounter (Signed)
Flecainide is an antiarrythmic medication that should only be prescribed by cardiology.   Laroy Apple, MD [facility] 05/06/2017, 5:01 PM

## 2017-05-09 NOTE — Telephone Encounter (Signed)
Aware to contact cardiologist for refill

## 2017-05-11 ENCOUNTER — Ambulatory Visit: Payer: Medicare Other | Admitting: Cardiology

## 2017-05-11 DIAGNOSIS — H26493 Other secondary cataract, bilateral: Secondary | ICD-10-CM | POA: Diagnosis not present

## 2017-05-11 DIAGNOSIS — Z961 Presence of intraocular lens: Secondary | ICD-10-CM | POA: Diagnosis not present

## 2017-05-11 DIAGNOSIS — H26491 Other secondary cataract, right eye: Secondary | ICD-10-CM | POA: Diagnosis not present

## 2017-05-11 DIAGNOSIS — H26492 Other secondary cataract, left eye: Secondary | ICD-10-CM | POA: Diagnosis not present

## 2017-05-19 ENCOUNTER — Other Ambulatory Visit: Payer: Self-pay | Admitting: Family Medicine

## 2017-05-19 NOTE — Telephone Encounter (Signed)
Last seen 04/21/17   Dr Wendi Snipes

## 2017-06-02 ENCOUNTER — Encounter: Payer: Self-pay | Admitting: Family Medicine

## 2017-06-02 ENCOUNTER — Ambulatory Visit (INDEPENDENT_AMBULATORY_CARE_PROVIDER_SITE_OTHER): Payer: Medicare Other | Admitting: Family Medicine

## 2017-06-02 ENCOUNTER — Ambulatory Visit (INDEPENDENT_AMBULATORY_CARE_PROVIDER_SITE_OTHER): Payer: Medicare Other

## 2017-06-02 VITALS — BP 156/85 | HR 82 | Temp 96.8°F | Ht 62.0 in | Wt 143.4 lb

## 2017-06-02 DIAGNOSIS — I480 Paroxysmal atrial fibrillation: Secondary | ICD-10-CM

## 2017-06-02 DIAGNOSIS — N3001 Acute cystitis with hematuria: Secondary | ICD-10-CM | POA: Diagnosis not present

## 2017-06-02 DIAGNOSIS — M5432 Sciatica, left side: Secondary | ICD-10-CM

## 2017-06-02 DIAGNOSIS — Z7901 Long term (current) use of anticoagulants: Secondary | ICD-10-CM | POA: Diagnosis not present

## 2017-06-02 DIAGNOSIS — R350 Frequency of micturition: Secondary | ICD-10-CM | POA: Diagnosis not present

## 2017-06-02 DIAGNOSIS — M545 Low back pain: Secondary | ICD-10-CM | POA: Diagnosis not present

## 2017-06-02 LAB — URINALYSIS, COMPLETE
BILIRUBIN UA: NEGATIVE
Glucose, UA: NEGATIVE
Ketones, UA: NEGATIVE
NITRITE UA: NEGATIVE
PH UA: 6 (ref 5.0–7.5)
Specific Gravity, UA: 1.015 (ref 1.005–1.030)
UUROB: 0.2 mg/dL (ref 0.2–1.0)

## 2017-06-02 LAB — MICROSCOPIC EXAMINATION

## 2017-06-02 LAB — COAGUCHEK XS/INR WAIVED
INR: 4.9 — AB (ref 0.9–1.1)
PROTHROMBIN TIME: 58.9 s

## 2017-06-02 MED ORDER — CEPHALEXIN 500 MG PO CAPS: 500.0000 mg | ORAL_CAPSULE | Freq: Three times a day (TID) | ORAL | 0 refills | Status: DC

## 2017-06-02 NOTE — Patient Instructions (Signed)
Great to see you!  Do not take Coumadin today Change your dosing to 1/2 pill on Monday, Wednesday, Friday, and Saturday Take 1 pill on other days.  Come back in 7-10 days for recheck

## 2017-06-02 NOTE — Progress Notes (Signed)
   HPI  Patient presents today for UTI, back pain, and INR check.  Patient denies any frequent bleeding, she had a small amount of blood she feels is from a hemorrhoid this morning, that has stopped. No nosebleeds No missed pills. She states that she takes 1 pill daily of Coumadin and one half on Monday and Friday.  Back pain Left-sided low back pain with radiation down the left leg to the left ankle. Continued since our last visit about 6 weeks ago. Helped by Tylenol. Taking approximately 500 mg of Tylenol 3-4 times daily.  UTI symptoms Increased urinary frequency and incontinence for about 1 week. No fever, chills, sweats.   PMH: Smoking status noted ROS: Per HPI  Objective: BP (!) 156/85   Pulse 82   Temp (!) 96.8 F (36 C) (Oral)   Ht '5\' 2"'$  (1.575 m)   Wt 143 lb 6.4 oz (65 kg)   BMI 26.23 kg/m  Gen: NAD, alert, cooperative with exam HEENT: NCAT CV: Regular rate Resp: CTABL, no wheezes, non-labored Abd: SNTND, BS present, no guarding or organomegaly Ext: No edema, warm Neuro: Alert and oriented, No gross deficits  Assessment and plan:  #UTI Treat with Keflex, no signs of urosepsis Culture  #Sciatica Persistent symptoms in 6 weeks now Helped by Tylenol, continue, discussed safe dosing Plain film and MRI ordered today, likely would benefit from epidural injections but not a very good surgical candidate  #Coumadin anticoagulation Patient has very sporadic INRs, today she is supratherapeutic without signs of serious bleed.  INR 4.9 Skip Coumadin x1 day, current dose is 30 mg/week Reduce to 25 mg/week, recheck in 7 to 10 days    Orders Placed This Encounter  Procedures  . Urine Culture  . DG Lumbar Spine 2-3 Views    Standing Status:   Future    Standing Expiration Date:   08/02/2018    Order Specific Question:   Reason for Exam (SYMPTOM  OR DIAGNOSIS REQUIRED)    Answer:   L sided scaitica    Order Specific Question:   Preferred imaging location?   Answer:   Internal  . MR Lumbar Spine Wo Contrast    Standing Status:   Future    Standing Expiration Date:   08/03/2018    Order Specific Question:   What is the patient's sedation requirement?    Answer:   No Sedation    Order Specific Question:   Does the patient have a pacemaker or implanted devices?    Answer:   No    Order Specific Question:   Preferred imaging location?    Answer:   [facility] (table limit-350lbs)    Order Specific Question:   Radiology Contrast Protocol - do NOT remove file path    Answer:   [email or web address]  . Urinalysis, Complete  . CoaguChek XS/INR Waived  . CBC with Differential/Platelet  . BMP8+EGFR    Meds ordered this encounter  Medications  . cephALEXin (KEFLEX) 500 MG capsule    Sig: Take 1 capsule (500 mg total) by mouth 3 (three) times daily.    Dispense:  21 capsule    Refill:  [place], MD [place] 06/02/2017, 11:17 AM

## 2017-06-03 LAB — CBC WITH DIFFERENTIAL/PLATELET
BASOS ABS: 0 10*3/uL (ref 0.0–0.2)
Basos: 1 %
EOS (ABSOLUTE): 0.1 10*3/uL (ref 0.0–0.4)
Eos: 2 %
Hematocrit: 34.7 % (ref 34.0–46.6)
Hemoglobin: 11.8 g/dL (ref 11.1–15.9)
IMMATURE GRANS (ABS): 0 10*3/uL (ref 0.0–0.1)
Immature Granulocytes: 0 %
LYMPHS: 20 %
Lymphocytes Absolute: 1 10*3/uL (ref 0.7–3.1)
MCH: 31 pg (ref 26.6–33.0)
MCHC: 34 g/dL (ref 31.5–35.7)
MCV: 91 fL (ref 79–97)
MONOS ABS: 0.7 10*3/uL (ref 0.1–0.9)
Monocytes: 14 %
NEUTROS ABS: 3.3 10*3/uL (ref 1.4–7.0)
Neutrophils: 63 %
PLATELETS: 275 10*3/uL (ref 150–450)
RBC: 3.81 x10E6/uL (ref 3.77–5.28)
RDW: 14.6 % (ref 12.3–15.4)
WBC: 5.2 10*3/uL (ref 3.4–10.8)

## 2017-06-03 LAB — BMP8+EGFR
BUN / CREAT RATIO: 13 (ref 12–28)
BUN: 9 mg/dL (ref 8–27)
CHLORIDE: 100 mmol/L (ref 96–106)
CO2: 25 mmol/L (ref 20–29)
Calcium: 8.7 mg/dL (ref 8.7–10.3)
Creatinine, Ser: 0.67 mg/dL (ref 0.57–1.00)
GFR calc non Af Amer: 82 mL/min/{1.73_m2} (ref >59)
GFR, EST AFRICAN AMERICAN: 94 mL/min/{1.73_m2} (ref >59)
GLUCOSE: 94 mg/dL (ref 65–99)
POTASSIUM: 4.4 mmol/L (ref 3.5–5.2)
SODIUM: 137 mmol/L (ref 134–144)

## 2017-06-04 LAB — URINE CULTURE

## 2017-06-10 ENCOUNTER — Ambulatory Visit (HOSPITAL_COMMUNITY)
Admission: RE | Admit: 2017-06-10 | Discharge: 2017-06-10 | Disposition: A | Discharge status: Home / Self Care | Payer: Medicare Other | Source: Ambulatory Visit | Attending: Family Medicine | Admitting: Family Medicine

## 2017-06-10 DIAGNOSIS — M5126 Other intervertebral disc displacement, lumbar region: Secondary | ICD-10-CM | POA: Diagnosis not present

## 2017-06-10 DIAGNOSIS — M48061 Spinal stenosis, lumbar region without neurogenic claudication: Secondary | ICD-10-CM | POA: Diagnosis not present

## 2017-06-10 DIAGNOSIS — M545 Low back pain: Secondary | ICD-10-CM | POA: Diagnosis not present

## 2017-06-10 DIAGNOSIS — M5432 Sciatica, left side: Secondary | ICD-10-CM | POA: Insufficient documentation

## 2017-06-13 ENCOUNTER — Encounter: Payer: Self-pay | Admitting: Family Medicine

## 2017-06-13 ENCOUNTER — Ambulatory Visit (INDEPENDENT_AMBULATORY_CARE_PROVIDER_SITE_OTHER): Payer: Medicare Other | Admitting: Family Medicine

## 2017-06-13 VITALS — BP 173/78 | HR 86 | Temp 98.2°F | Ht 62.0 in | Wt 143.0 lb

## 2017-06-13 DIAGNOSIS — Z7901 Long term (current) use of anticoagulants: Secondary | ICD-10-CM

## 2017-06-13 DIAGNOSIS — M5126 Other intervertebral disc displacement, lumbar region: Secondary | ICD-10-CM

## 2017-06-13 DIAGNOSIS — M5136 Other intervertebral disc degeneration, lumbar region: Secondary | ICD-10-CM

## 2017-06-13 DIAGNOSIS — Z5181 Encounter for therapeutic drug level monitoring: Secondary | ICD-10-CM

## 2017-06-13 LAB — COAGUCHEK XS/INR WAIVED
INR: 1.4 — ABNORMAL HIGH (ref 0.9–1.1)
Prothrombin Time: 16.6 s

## 2017-06-13 MED ORDER — TRAMADOL HCL 50 MG PO TABS: 50.0000 mg | ORAL_TABLET | Freq: Two times a day (BID) | ORAL | 0 refills | Status: DC | PRN

## 2017-06-13 MED ORDER — LEVOTHYROXINE SODIUM 50 MCG PO TABS: ORAL_TABLET | ORAL | 2 refills | Status: DC

## 2017-06-13 NOTE — Progress Notes (Signed)
Subjective: CC: back pain PCP: Timmothy Euler, MD [name] is a 84 y.o. female presenting to clinic today for:  1. Back pain Patient with approximately 29-month history of left-sided low back pain radiating to the left lower extremity.  She was seen by her PCP 1 week ago, who ordered an MRI of her back.  The MRI demonstrated multilevel degenerative changes including bulging disks.  PCP plan at last visit was to refer to orthopedics for back injections, as patient is a poor surgical candidate.  Patient reports that back pain has been somewhat relieved by oral tramadol.  She thinks that she is about out of this medication.  Denies excessive sedation.  She reports that she continues to have left-sided low back pain with radiation to the left lower extremity.  She used to see raise her orthopedics in [place] for her knee problems but does wish to seek care closer to home.  2.  Chronic anticoagulation Patient noted to have supratherapeutic INR to 4.9 at last visit.  Her Coumadin was reduced from 30 mg/week to 25 mg/week.  She presents today for her 1 week recheck.  Denies eating any unusual foods.  She reports compliance with medications.  Denies any unusual swelling or bleeding.   ROS: Per HPI  Allergies  Allergen Reactions  . Penicillins Rash and Other (See Comments)    Has patient had a PCN reaction causing immediate rash, facial/tongue/throat swelling, SOB or lightheadedness with hypotension: No Has patient had a PCN reaction causing severe rash involving mucus membranes or skin necrosis: No Has patient had a PCN reaction that required hospitalization No Has patient had a PCN reaction occurring within the last 10 years: No If all of the above answers are "NO", then may proceed with Cephalosporin use.   Past Medical History:  Diagnosis Date  . Anemia   . Atrial fibrillation (HCC)    Paroxysmal, Flecainide therapy  . Calf pain    September, 2012, at rest  . Cardiomegaly     EF normal, echo, 2003  //   EF 60-65%, echo, June 21, 3498, diastolic dysfunction,  . Carotid bruit    Doppler, December, 2009, no abnormality  . Cataract   . Diverticulosis   . GERD (gastroesophageal reflux disease)   . History of shingles   . HLD (hyperlipidemia)   . HTN (hypertension)   . Hypothyroidism   . Hypothyroidism   . Insomnia   . Normal nuclear stress test    Normal, 2001  . Osteoarthritis   . PONV (postoperative nausea and vomiting)   . Rectal bleeding 2001   diverticulosis and int hemorrhoids on 07/1999 and 02/2010 colonoscopies.  . Stroke ([place])   . Warfarin anticoagulation     Current Outpatient Medications:  .  Acetaminophen (TYLENOL) 325 MG CAPS, Take 650 mg by mouth every 6 (six) hours as needed for mild pain., Disp: , Rfl:  .  ARTIFICIAL TEAR OP, Apply 2 drops to eye daily as needed (Dryness). To the left eye, Disp: , Rfl:  .  atorvastatin (LIPITOR) 20 MG tablet, TAKE 1 TABLET BY MOUTH AT  BEDTIME, Disp: 90 tablet, Rfl: 1 .  cephALEXin (KEFLEX) 500 MG capsule, Take 1 capsule (500 mg total) by mouth 3 (three) times daily., Disp: 21 capsule, Rfl: 0 .  flecainide (TAMBOCOR) 50 MG tablet, Take 0.5 tablets (25 mg total) by mouth 2 (two) times daily., Disp: 30 tablet, Rfl: 0 .  irbesartan (AVAPRO) 150 MG tablet, Take 1 tablet (  150 mg total) by mouth daily., Disp: 90 tablet, Rfl: 0 .  levETIRAcetam (KEPPRA) 250 MG tablet, TAKE 1 TABLET BY MOUTH TWO  TIMES DAILY, Disp: 180 tablet, Rfl: 1 .  levothyroxine (SYNTHROID, LEVOTHROID) 50 MCG tablet, Take 1 tablet daily before breakfast, Disp: 30 tablet, Rfl: 2 .  mirtazapine (REMERON) 15 MG tablet, TAKE ONE TABLET AT BEDTIME, Disp: 30 tablet, Rfl: 5 .  pantoprazole (PROTONIX) 40 MG tablet, TAKE 1 TABLET DAILY, Disp: 90 tablet, Rfl: 1 .  potassium chloride SA (K-DUR,KLOR-CON) 20 MEQ tablet, Take 0.5 tablets (10 mEq total) by mouth daily., Disp: 10 tablet, Rfl: 0 .  traMADol (ULTRAM) 50 MG tablet, Take 1 tablet (50 mg total) by  mouth every 8 (eight) hours as needed., Disp: 30 tablet, Rfl: 0 .  traZODone (DESYREL) 50 MG tablet, TAKE 1 TABLET AT BEDTIME AS NEEDED FOR SLEEP, Disp: 30 tablet, Rfl: 2 .  warfarin (COUMADIN) 5 MG tablet, Take 1 tablet (5 mg total) by mouth daily., Disp: 10 tablet, Rfl: 0 Social History   Socioeconomic History  . Marital status: Married    Spouse name: Not on file  . Number of children: Not on file  . Years of education: Not on file  . Highest education level: Not on file  Occupational History  . Not on file  Social Needs  . Financial resource strain: Not on file  . Food insecurity:    Worry: Not on file    Inability: Not on file  . Transportation needs:    Medical: Not on file    Non-medical: Not on file  Tobacco Use  . Smoking status: Never Smoker  . Smokeless tobacco: Never Used  Substance and Sexual Activity  . Alcohol use: No  . Drug use: No  . Sexual activity: Never  Lifestyle  . Physical activity:    Days per week: Not on file    Minutes per session: Not on file  . Stress: Not on file  Relationships  . Social connections:    Talks on phone: Not on file    Gets together: Not on file    Attends religious service: Not on file    Active member of club or organization: Not on file    Attends meetings of clubs or organizations: Not on file    Relationship status: Not on file  . Intimate partner violence:    Fear of current or ex partner: Not on file    Emotionally abused: Not on file    Physically abused: Not on file    Forced sexual activity: Not on file  Other Topics Concern  . Not on file  Social History Narrative  . Not on file   Family History  Problem Relation Age of Onset  . COPD Brother   . Atrial fibrillation Brother   . Diabetes Brother   . Liver cancer Brother        Alcohol-related  . Colon cancer Brother   . Heart attack Father   . Early death Father   . Rheum arthritis Mother   . Arthritis Mother   . Heart disease Mother   . Coronary  artery disease Brother   . Heart attack Daughter   . Gallbladder disease Brother     Objective: Office vital signs reviewed. BP (!) 173/78   Pulse 86   Temp 98.2 F (36.8 C) (Oral)   Ht 5\' 2"  (1.575 m)   Wt 143 lb (64.9 kg)   BMI 26.16 kg/m  Physical Examination:  General: Awake, alert, frail appearing female, No acute distress Cardio: regular rate, +2 radial pulses Pulm: normal work of breathing on room air Extremities: warm, No edema MSK: antalgic gait and station  Assessment/ Plan: 84 y.o. female   1. Bulging of lumbar intervertebral disc We reviewed the results of her MRI together in office.  I have placed referral to orthopedics for further evaluation and management.  Given the fact that patient is likely a poor surgical candidate, I do agree with referral for consideration of therapeutic injections to the back.  I have refilled her tramadol in the meantime.  We did discuss that tramadol may be the cause of her fluctuating Coumadin levels and I recommended that she take this sparingly if possible.  Corticosteroids were considered today but did not want to interfere with her getting possible therapeutic injections with orthopedics.  Reasons for emergent evaluation the emergency department discussed with both patient and her husband.  They voiced good understanding. The Narcotic Database has been reviewed.  There were no red flags.   - Ambulatory referral to Orthopedic Surgery  2. Warfarin anticoagulation INR is subtherapeutic today, 1.4.  I recommended that she resume use of previous Coumadin dosing, 1 tablet daily except for Mondays and Fridays she will take 1/2 tablet daily.  She will return in 1 week for repeat INR. - CoaguChek XS/INR Waived  3. Subtherapeutic anticoagulation   Orders Placed This Encounter  Procedures  . CoaguChek XS/INR Waived  . Ambulatory referral to Orthopedic Surgery    Referral Priority:   Routine    Referral Type:   Surgical    Referral  Reason:   Specialty Services Required    Requested Specialty:   Orthopedic Surgery    Number of Visits Requested:   1   Meds ordered this encounter  Medications  . levothyroxine (SYNTHROID, LEVOTHROID) 50 MCG tablet    Sig: Take 1 tablet daily before breakfast    Dispense:  30 tablet    Refill:  2  . traMADol (ULTRAM) 50 MG tablet    Sig: Take 1 tablet (50 mg total) by mouth every 12 (twelve) hours as needed.    Dispense:  14 tablet    Refill:  [place], DO [place] [phone]

## 2017-06-13 NOTE — Patient Instructions (Addendum)
Your INR was 1.4 today.  I want you to go back to the previous Coumadin dosing of 1 tablet daily except for Mondays and Fridays, take 1/2 tablet.  Follow-up with Dr. Wendi Snipes in 1 week for repeat Coumadin check.  I have placed a referral to orthopedics for your back.  We reviewed your MRI and I gave you a copy of this today.   Herniated Disk A herniated disk, also called a ruptured disk or slipped disk, occurs when a disk in the spine bulges out too far. Between the bones in the spine (vertebrae), there are oval disks that are made of a soft, spongy center that is surrounded by a tough outer ring. The disks connect your vertebrae, help your spine move, and absorb shocks from your movement. When you have a herniated disk, the spongy center of the disk bulges out or breaks through the outer ring. It can press on a nerve between the vertebrae and cause pain. This can occur anywhere in the back or neck area, but the lower back is most commonly affected. What are the causes? This condition may be caused by:  Age-related wear and tear. The spongy centers of spinal disks tend to shrink and dry out with age, which makes them more likely to herniate.  Sudden injury, such as a strain or sprain.  What increases the risk? Aging is the main risk factor for a herniated disk. Other risk factors include:  Being a man who is 66-68 years old.  Frequently doing activities that involve heavy lifting, bending, or twisting.  Frequently driving for long hours at a time.  Not getting enough exercise.  Being overweight.  Smoking.  Having a family history of back problems or herniated disks.  Being pregnant or giving birth.  Having poor nutrition.  Being tall.  What are the signs or symptoms? Symptoms may vary depending on where your herniated disk is located.  A herniated disk in the lower back may cause sharp pain in: ? Part of the arm, leg, hip, or buttocks. ? The back of the lower leg  (calf). ? The lower back, spreading down through the leg into the foot (sciatica).  A herniated disk in the neck may cause dizziness and vertigo. It may also cause pain or weakness in: ? The neck. ? The shoulder blades. ? Upper arm, forearm, or fingers.  You may also have muscle weakness. It may be difficult to: ? Lift your leg or arm. ? Stand on your toes. ? Squeeze tightly with one of your hands.  Other symptoms may include: ? Numbness or tingling in the affected areas of the hands, arms, feet, or legs. ? Inability to control when you urinate or when you have bowel movements. This is a rare but serious sign of a severe herniated disk in the lower back.  How is this diagnosed? This condition may be diagnosed based on:  Your symptoms.  Your medical history.  A physical exam. The exam may include: ? Straight-leg test. You will lie on your back while your health care provider lifts your leg, keeping your knee straight. If you feel pain, you likely have a herniated disk. ? Neurological tests. This includes checking for numbness, reflexes, muscle strength, and posture.  Imaging tests, such as: ? X-rays. ? MRI. ? CT scan. ? Electromyogram (EMG) to check the nerves that control muscles. This test may be used to determine which nerves are affected by your herniated disk.  How is this treated? Treatment  for this condition may include:  A short period of rest. This is usually the first treatment. ? You may be on bed rest for up to 2 days, or you may be instructed to stay home and avoid physical activity. ? If you have a herniated disk in your lower back, avoid sitting as much as possible. Sitting increases pressure on the disk.  Medicines. These may include: ? NSAIDs to help reduce pain and swelling. ? Muscle relaxants to prevent sudden tightening of the back muscles (back spasms). ? Prescription pain medicines, if you have severe pain.  Steroid injections in the area of the  herniated disk. This can help reduce pain and swelling.  Physical therapy to strengthen your back muscles.  In many cases, symptoms go away with treatment over a period of days or weeks. You will most likely be free of symptoms after 3-4 months. If other treatments do not help to relieve your symptoms, you may need surgery. Follow these instructions at home: Medicines  Take over-the-counter and prescription medicines only as told by your health care provider.  Do not drive or use heavy machinery while taking prescription pain medicine. Activity  Rest as directed.  After your rest period: ? Return to your normal activities and gradually begin exercising as told by your health care provider. Ask your health care provider what activities and exercises are safe for you. ? Use good posture. ? Avoid movements that cause pain. ? Do not lift anything that is heavier than 10 lb (4.5 kg) until your health care provider says this is safe. ? Do not sit or stand for long periods of time without changing positions. ? Do not sit for long periods of time without getting up and moving around.  If physical therapy was prescribed, do exercises as instructed.  Aim to strengthen muscles in your back and abdomen with exercises like crunches, swimming, or walking. General instructions  Do not use any products that contain nicotine or tobacco, such as cigarettes and e-cigarettes. These products can delay healing. If you need help quitting, ask your health care provider.  Do not wear high-heeled shoes.  Do not sleep on your belly.  If you are overweight, work with your health care provider to lose weight safely.  To prevent or treat constipation while you are taking prescription pain medicine, your health care provider may recommend that you: ? Drink enough fluid to keep your urine clear or pale yellow. ? Take over-the-counter or prescription medicines. ? Eat foods that are high in fiber, such as fresh  fruits and vegetables, whole grains, and beans. ? Limit foods that are high in fat and processed sugars, such as fried and sweet foods.  Keep all follow-up visits as told by your health care provider. This is important. How is this prevented?  Maintain a healthy weight.  Try to avoid stressful situations.  Maintain physical fitness. Do at least 150 minutes of moderate-intensity exercise each week, such as brisk walking or water aerobics.  When lifting objects: ? Keep your feet at least shoulder-width apart and tighten your abdominal muscles. ? Keep your spine neutral as you bend your knees and hips. It is important to lift using the strength of your legs, not your back. Do not lock your knees straight out. ? Always ask for help to lift heavy or awkward objects. Contact a health care provider if:  You have back pain or neck pain that does not get better after 6 weeks.  You have  severe pain in your back, neck, legs, or arms.  You develop numbness, tingling, or weakness in any part of your body. Get help right away if:  You cannot move your arms or legs.  You cannot control when you urinate or have bowel movements.  You feel dizzy or you faint.  You have shortness of breath. This information is not intended to replace advice given to you by your health care provider. Make sure you discuss any questions you have with your health care provider. Document Released: 12/19/1999 Document Revised: 08/18/2015 Document Reviewed: 08/18/2015 Elsevier Interactive Patient Education  2017 [employer].

## 2017-06-14 ENCOUNTER — Ambulatory Visit: Payer: Medicare Other | Admitting: Family Medicine

## 2017-06-16 ENCOUNTER — Other Ambulatory Visit: Payer: Self-pay | Admitting: Family Medicine

## 2017-06-21 NOTE — Progress Notes (Signed)
HPI The patient has a history of atrial fibrillation and premature contractions.   She was in the hospital twice this year.  I reviewed these records for this visit.    She was in overnight in Jan with chest pain and she ruled out.  She was in atrial fib but spontaneously converted to NSR.   In March she was in the hospital with a TIA.  I did review the hospital records and I note that she was taken off of atenolol probably because of orthostasis though it is not entirely clear to me from the discharge summary.  She was not at home on any AV nodal blocking agent.  She is had some probable paroxysms of fibrillation for which she will take an extra half of the flecainide.  She is had to do this a couple of times.  She is not having any presyncope or syncope.  She has no new shortness of breath, PND or orthopnea.  She is had no chest pressure, neck or arm discomfort.  She has had significant problems with her gait and near falls.   Allergies  Allergen Reactions  . Penicillins Rash and Other (See Comments)    Has patient had a PCN reaction causing immediate rash, facial/tongue/throat swelling, SOB or lightheadedness with hypotension: No Has patient had a PCN reaction causing severe rash involving mucus membranes or skin necrosis: No Has patient had a PCN reaction that required hospitalization No Has patient had a PCN reaction occurring within the last 10 years: No If all of the above answers are "NO", then may proceed with Cephalosporin use.    Current Outpatient Medications  Medication Sig Dispense Refill  . Acetaminophen (TYLENOL) 325 MG CAPS Take 650 mg by mouth every 6 (six) hours as needed for mild pain.    Marland Kitchen ARTIFICIAL TEAR OP Apply 2 drops to eye daily as needed (Dryness). To the left eye    . atorvastatin (LIPITOR) 20 MG tablet TAKE 1 TABLET BY MOUTH AT  BEDTIME 90 tablet 1  . flecainide (TAMBOCOR) 50 MG tablet Take 0.5 tablets (25 mg total) by mouth 2 (two) times daily. 30 tablet 0  .  irbesartan (AVAPRO) 150 MG tablet TAKE 1 TABLET DAILY 90 tablet 0  . levETIRAcetam (KEPPRA) 250 MG tablet TAKE 1 TABLET BY MOUTH TWO  TIMES DAILY 180 tablet 1  . levothyroxine (SYNTHROID, LEVOTHROID) 50 MCG tablet Take 1 tablet daily before breakfast 30 tablet 2  . mirtazapine (REMERON) 15 MG tablet TAKE ONE TABLET AT BEDTIME 30 tablet 5  . pantoprazole (PROTONIX) 40 MG tablet TAKE 1 TABLET DAILY 90 tablet 1  . potassium chloride SA (K-DUR,KLOR-CON) 20 MEQ tablet Take 0.5 tablets (10 mEq total) by mouth daily. 10 tablet 0  . traMADol (ULTRAM) 50 MG tablet Take 1 tablet (50 mg total) by mouth every 12 (twelve) hours as needed. 14 tablet 0  . traZODone (DESYREL) 50 MG tablet TAKE 1 TABLET AT BEDTIME AS NEEDED FOR SLEEP 30 tablet 2  . warfarin (COUMADIN) 5 MG tablet Take 1 tablet (5 mg total) by mouth daily. 10 tablet 0   No current facility-administered medications for this visit.     Past Medical History:  Diagnosis Date  . Anemia   . Atrial fibrillation (HCC)    Paroxysmal, Flecainide therapy  . Calf pain    September, 2012, at rest  . Carotid bruit    Doppler, December, 2009, no abnormality  . Cataract   . Diverticulosis   .  GERD (gastroesophageal reflux disease)   . History of shingles   . HLD (hyperlipidemia)   . HTN (hypertension)   . Hypothyroidism   . Insomnia   . Osteoarthritis   . PONV (postoperative nausea and vomiting)   . Rectal bleeding 2001   diverticulosis and int hemorrhoids on 07/1999 and 02/2010 colonoscopies.  . Stroke ([place])   . Warfarin anticoagulation     Past Surgical History:  Procedure Laterality Date  . BIOPSY  09/29/2015   Procedure: BIOPSY;  Surgeon: Rogene Houston, MD;  [facility]: [facility];  Service: Endoscopy;;  gastric  . CATARACT EXTRACTION W/PHACO Right 04/15/2014   Procedure: CATARACT EXTRACTION PHACO AND INTRAOCULAR LENS PLACEMENT (IOC);  Surgeon: Tonny Branch, MD;  Location: [facility];  Service: Ophthalmology;  Laterality: Right;  CDE: 13.30    . CATARACT EXTRACTION W/PHACO Left 05/13/2014   Procedure: CATARACT EXTRACTION PHACO AND INTRAOCULAR LENS PLACEMENT LEFT EYE;  Surgeon: Tonny Branch, MD;  Location: [facility];  Service: Ophthalmology;  Laterality: Left;  CDE:13.00  . CHOLECYSTECTOMY N/A 01/15/2015   Procedure: LAPAROSCOPIC CHOLECYSTECTOMY;  Surgeon: Ralene Ok, MD;  Location: [facility];  Service: General;  Laterality: N/A;  . COLONOSCOPY N/A 09/29/2015   Procedure: COLONOSCOPY;  Surgeon: Rogene Houston, MD;  [facility];  Service: Endoscopy;  Laterality: N/A;  . COLONOSCOPY    . ESOPHAGOGASTRODUODENOSCOPY N/A 01/13/2015   Procedure: ESOPHAGOGASTRODUODENOSCOPY (EGD);  Surgeon: Jerene Bears, MD;  Location: [facility];  Service: Endoscopy;  Laterality: N/A;  . ESOPHAGOGASTRODUODENOSCOPY N/A 09/29/2015   Procedure: ESOPHAGOGASTRODUODENOSCOPY (EGD);  Surgeon: Rogene Houston, MD;  [facility];  Service: Endoscopy;  Laterality: N/A;  12:15  . GALLBLADDER SURGERY    . LEFT HEART CATH AND CORONARY ANGIOGRAPHY N/A 06/22/2016   Procedure: Left Heart Cath and Coronary Angiography;  Surgeon: Troy Sine, MD;  Location: [facility];  Service: Cardiovascular;  Laterality: N/A;  . SHOULDER OPEN ROTATOR CUFF REPAIR     bilateral  . TOTAL KNEE ARTHROPLASTY  05/26/10   right  . TOTAL KNEE ARTHROPLASTY  11/17/2010   Procedure: TOTAL KNEE ARTHROPLASTY;  Surgeon: Mauri Pole;  Location: [facility];  Service: Orthopedics;  Laterality: Left;  . TOTAL KNEE ARTHROPLASTY     Right  . TOTAL SHOULDER REPLACEMENT  01/2010   left    ROS:    Gait disturbance  Otherwise as stated in the HPI and negative for all other systems.  PHYSICAL EXAM BP (!) 176/98   Pulse 80   Ht 5\' 2"  (1.575 m)   Wt 143 lb (64.9 kg)   BMI 26.16 kg/m   GENERAL: Frail-appearing NECK:  No jugular venous distention, waveform within normal limits, carotid upstroke brisk and symmetric, no bruits, no thyromegaly LUNGS:  Clear to auscultation  bilaterally CHEST:  Unremarkable HEART:  PMI not displaced or sustained,S1 and S2 within normal limits, no S3, no S4, no clicks, no rubs, no murmurs ABD:  Flat, positive bowel sounds normal in frequency in pitch, no bruits, no rebound, no guarding, no midline pulsatile mass, no hepatomegaly, no splenomegaly EXT:  2 plus pulses throughout, no edema, no cyanosis no clubbing    ASSESSMENT AND PLAN   ATRIAL FIB/FLUTTER:    Ms. JUNIPER COBEY has a CHA2DS2 - VASc score of 6.  She tolerates anticoagulation.  She has to take a as needed flecainide which I think is reasonable as it seems to abort prolonged fibrillation.  She is having to do this  rarely so I do not want to go up on the dose.  However, she should be on an AV nodal blocking agent and I do not see an absolute contraindication to this.  I will start Toprol 50 mg daily.  PVCs:       These are about 3% by a Holter previously.  No change in therapy.  HTN:  The blood pressure is elevated and this will be treated as above.

## 2017-06-22 ENCOUNTER — Encounter: Payer: Self-pay | Admitting: Cardiology

## 2017-06-22 ENCOUNTER — Ambulatory Visit: Payer: Medicare Other | Admitting: Cardiology

## 2017-06-22 VITALS — BP 176/98 | HR 80 | Ht 62.0 in | Wt 143.0 lb

## 2017-06-22 DIAGNOSIS — Z9181 History of falling: Secondary | ICD-10-CM

## 2017-06-22 MED ORDER — METOPROLOL SUCCINATE ER 50 MG PO TB24: 50.0000 mg | ORAL_TABLET | Freq: Every day | ORAL | 3 refills | Status: DC

## 2017-06-22 NOTE — Patient Instructions (Signed)
Medication Instructions:  Please start Metoprolol 50 mg a day. Continue all other medications as listed.  You will be contacted about scheduling physical therapy for your balance issues.  Follow-Up: Follow up in 6 months with Dr. Percival Spanish.  You will receive a letter in the mail 2 months before you are due.  Please call us when you receive this letter to schedule your follow up appointment.  If you need a refill on your cardiac medications before your next appointment, please call your pharmacy.  Thank you for choosing [facility]!!     Fall Prevention in the Home Falls can cause injuries. They can happen to people of all ages. There are many things you can do to make your home safe and to help prevent falls. What can I do on the outside of my home?  Regularly fix the edges of walkways and driveways and fix any cracks.  Remove anything that might make you trip as you walk through a door, such as a raised step or threshold.  Trim any bushes or trees on the path to your home.  Use bright outdoor lighting.  Clear any walking paths of anything that might make someone trip, such as rocks or tools.  Regularly check to see if handrails are loose or broken. Make sure that both sides of any steps have handrails.  Any raised decks and porches should have guardrails on the edges.  Have any leaves, snow, or ice cleared regularly.  Use sand or salt on walking paths during winter.  Clean up any spills in your garage right away. This includes oil or grease spills. What can I do in the bathroom?  Use night lights.  Install grab bars by the toilet and in the tub and shower. Do not use towel bars as grab bars.  Use non-skid mats or decals in the tub or shower.  If you need to sit down in the shower, use a plastic, non-slip stool.  Keep the floor dry. Clean up any water that spills on the floor as soon as it happens.  Remove soap buildup in the tub or shower  regularly.  Attach bath mats securely with double-sided non-slip rug tape.  Do not have throw rugs and other things on the floor that can make you trip. What can I do in the bedroom?  Use night lights.  Make sure that you have a light by your bed that is easy to reach.  Do not use any sheets or blankets that are too big for your bed. They should not hang down onto the floor.  Have a firm chair that has side arms. You can use this for support while you get dressed.  Do not have throw rugs and other things on the floor that can make you trip. What can I do in the kitchen?  Clean up any spills right away.  Avoid walking on wet floors.  Keep items that you use a lot in easy-to-reach places.  If you need to reach something above you, use a strong step stool that has a grab bar.  Keep electrical cords out of the way.  Do not use floor polish or wax that makes floors slippery. If you must use wax, use non-skid floor wax.  Do not have throw rugs and other things on the floor that can make you trip. What can I do with my stairs?  Do not leave any items on the stairs.  Make sure that there are handrails on  both sides of the stairs and use them. Fix handrails that are broken or loose. Make sure that handrails are as long as the stairways.  Check any carpeting to make sure that it is firmly attached to the stairs. Fix any carpet that is loose or worn.  Avoid having throw rugs at the top or bottom of the stairs. If you do have throw rugs, attach them to the floor with carpet tape.  Make sure that you have a light switch at the top of the stairs and the bottom of the stairs. If you do not have them, ask someone to add them for you. What else can I do to help prevent falls?  Wear shoes that: ? Do not have high heels. ? Have rubber bottoms. ? Are comfortable and fit you well. ? Are closed at the toe. Do not wear sandals.  If you use a stepladder: ? Make sure that it is fully  opened. Do not climb a closed stepladder. ? Make sure that both sides of the stepladder are locked into place. ? Ask someone to hold it for you, if possible.  Clearly mark and make sure that you can see: ? Any grab bars or handrails. ? First and last steps. ? Where the edge of each step is.  Use tools that help you move around (mobility aids) if they are needed. These include: ? Canes. ? Walkers. ? Scooters. ? Crutches.  Turn on the lights when you go into a dark area. Replace any light bulbs as soon as they burn out.  Set up your furniture so you have a clear path. Avoid moving your furniture around.  If any of your floors are uneven, fix them.  If there are any pets around you, be aware of where they are.  Review your medicines with your doctor. Some medicines can make you feel dizzy. This can increase your chance of falling. Ask your doctor what other things that you can do to help prevent falls. This information is not intended to replace advice given to you by your health care provider. Make sure you discuss any questions you have with your health care provider. Document Released: 10/17/2008 Document Revised: 05/29/2015 Document Reviewed: 01/25/2014 Elsevier Interactive Patient Education  [employer].

## 2017-06-23 ENCOUNTER — Encounter (HOSPITAL_COMMUNITY): Payer: Self-pay

## 2017-06-23 ENCOUNTER — Emergency Department (HOSPITAL_COMMUNITY)
Admission: EM | Admit: 2017-06-23 | Discharge: 2017-06-23 | Disposition: A | Discharge status: Home / Self Care | Payer: Medicare Other | Attending: Emergency Medicine | Admitting: Emergency Medicine

## 2017-06-23 ENCOUNTER — Emergency Department (HOSPITAL_COMMUNITY): Payer: Medicare Other

## 2017-06-23 ENCOUNTER — Other Ambulatory Visit: Payer: Self-pay | Admitting: Orthopedic Surgery

## 2017-06-23 ENCOUNTER — Other Ambulatory Visit (INDEPENDENT_AMBULATORY_CARE_PROVIDER_SITE_OTHER): Payer: Medicare Other

## 2017-06-23 DIAGNOSIS — Y9301 Activity, walking, marching and hiking: Secondary | ICD-10-CM | POA: Diagnosis not present

## 2017-06-23 DIAGNOSIS — I5032 Chronic diastolic (congestive) heart failure: Secondary | ICD-10-CM | POA: Insufficient documentation

## 2017-06-23 DIAGNOSIS — M545 Low back pain: Secondary | ICD-10-CM | POA: Diagnosis not present

## 2017-06-23 DIAGNOSIS — R079 Chest pain, unspecified: Secondary | ICD-10-CM | POA: Diagnosis not present

## 2017-06-23 DIAGNOSIS — R0902 Hypoxemia: Secondary | ICD-10-CM | POA: Diagnosis not present

## 2017-06-23 DIAGNOSIS — E039 Hypothyroidism, unspecified: Secondary | ICD-10-CM | POA: Insufficient documentation

## 2017-06-23 DIAGNOSIS — S2231XA Fracture of one rib, right side, initial encounter for closed fracture: Secondary | ICD-10-CM | POA: Diagnosis not present

## 2017-06-23 DIAGNOSIS — Y998 Other external cause status: Secondary | ICD-10-CM | POA: Insufficient documentation

## 2017-06-23 DIAGNOSIS — I110 Hypertensive heart disease with heart failure: Secondary | ICD-10-CM | POA: Diagnosis not present

## 2017-06-23 DIAGNOSIS — W010XXA Fall on same level from slipping, tripping and stumbling without subsequent striking against object, initial encounter: Secondary | ICD-10-CM | POA: Insufficient documentation

## 2017-06-23 DIAGNOSIS — M25552 Pain in left hip: Secondary | ICD-10-CM | POA: Diagnosis not present

## 2017-06-23 DIAGNOSIS — Y92009 Unspecified place in unspecified non-institutional (private) residence as the place of occurrence of the external cause: Secondary | ICD-10-CM | POA: Insufficient documentation

## 2017-06-23 DIAGNOSIS — Z79899 Other long term (current) drug therapy: Secondary | ICD-10-CM | POA: Diagnosis not present

## 2017-06-23 DIAGNOSIS — R0789 Other chest pain: Secondary | ICD-10-CM | POA: Diagnosis not present

## 2017-06-23 DIAGNOSIS — Z7901 Long term (current) use of anticoagulants: Secondary | ICD-10-CM | POA: Diagnosis not present

## 2017-06-23 DIAGNOSIS — R52 Pain, unspecified: Secondary | ICD-10-CM | POA: Diagnosis not present

## 2017-06-23 DIAGNOSIS — Z8673 Personal history of transient ischemic attack (TIA), and cerebral infarction without residual deficits: Secondary | ICD-10-CM | POA: Diagnosis not present

## 2017-06-23 DIAGNOSIS — S299XXA Unspecified injury of thorax, initial encounter: Secondary | ICD-10-CM | POA: Diagnosis present

## 2017-06-23 DIAGNOSIS — W19XXXA Unspecified fall, initial encounter: Secondary | ICD-10-CM

## 2017-06-23 MED ORDER — HYDROCODONE-ACETAMINOPHEN 5-325 MG PO TABS: 1.0000 | ORAL_TABLET | Freq: Four times a day (QID) | ORAL | 0 refills | Status: DC | PRN

## 2017-06-23 MED ORDER — MORPHINE SULFATE (PF) 4 MG/ML IV SOLN
4.0000 mg | Freq: Once | INTRAVENOUS | Status: AC
  Administered 2017-06-23: 4 mg via INTRAMUSCULAR
  Filled 2017-06-23: qty 1

## 2017-06-23 NOTE — ED Notes (Signed)
Pt fell on arm rest of chair at home and landed on her chest. C/o chest pain on palpation, no bruising noted.

## 2017-06-23 NOTE — ED Triage Notes (Signed)
Pt brought in by EMS. Pt states she has been off balanced lately. Today she was walking and fell into the armrest of a chair. Landing on her chest

## 2017-06-23 NOTE — ED Provider Notes (Signed)
[facility] EMERGENCY DEPARTMENT Provider Note   CSN: [id number] Arrival date & time: 06/23/17  1401     History   Chief Complaint Chief Complaint  Patient presents with  . Fall    HPI [name] is a 84 y.o. female.  HPI Presents with concern of chest pain. Pain began after mechanical fall.  When she recalls the fall in its entirety. She is here with her husband who assists with the HPI. She was standing, twisting, when she fell after striking the edge of the chair. No head trauma no loss of consciousness, no subsequent confusion, disorientation, or new weakness in any extremity.  Since the fall the pain is been sore, severe, throughout to the chest worse with inspiration, coughing. Past Medical History:  Diagnosis Date  . Anemia   . Atrial fibrillation (HCC)    Paroxysmal, Flecainide therapy  . Calf pain    September, 2012, at rest  . Carotid bruit    Doppler, December, 2009, no abnormality  . Cataract   . Diverticulosis   . GERD (gastroesophageal reflux disease)   . History of shingles   . HLD (hyperlipidemia)   . HTN (hypertension)   . Hypothyroidism   . Insomnia   . Osteoarthritis   . PONV (postoperative nausea and vomiting)   . Rectal bleeding 2001   diverticulosis and int hemorrhoids on 07/1999 and 02/2010 colonoscopies.  . Stroke ([place])   . Warfarin anticoagulation     Patient Active Problem List   Diagnosis Date Noted  . TIA (transient ischemic attack) 03/15/2017  . Osteoporosis 02/24/2017  . Prolonged QT interval 01/28/2017  . Abnormal nuclear cardiac imaging test   . Unstable angina ([place]) 06/16/2016  . Atrial flutter ([place]) 06/16/2016  . Abnormal stress test 06/16/2016  . Frequent urination 05/10/2016  . Acute cystitis with hematuria 05/10/2016  . Chest pain 04/23/2016  . Hypomagnesemia 04/23/2016  . AP (abdominal pain)   . Melena 09/23/2015  . Overactive bladder 05/02/2015  . Acute cerebrovascular accident (CVA) ([place]) 04/21/2015  . Acute  cardioembolic stroke ([place]) 51/02/5850  . Healthcare maintenance 02/20/2015  . History of dysarthria 02/20/2015  . Enteritis due to Clostridium difficile 01/27/2015  . HTN (hypertension) 01/11/2015  . Hyponatremia 01/11/2015  . Rectal bleeding 01/11/2015  . Chronic pain syndrome 11/25/2014  . Insomnia 11/25/2014  . Chronic diastolic CHF (congestive heart failure) ([place]) 11/12/2014  . Paroxysmal atrial fibrillation ([place]) 10/14/2014  . Warfarin anticoagulation   . Hypothyroidism   . Carotid bruit   . Normal nuclear stress test   . S/P knee replacement   . UNSPECIFIED ANEMIA 01/19/2010  . DIVERTICULOSIS-COLON 01/19/2010    Past Surgical History:  Procedure Laterality Date  . BIOPSY  09/29/2015   Procedure: BIOPSY;  Surgeon: Rogene Houston, MD;  Location: [facility];  Service: Endoscopy;;  gastric  . CATARACT EXTRACTION W/PHACO Right 04/15/2014   Procedure: CATARACT EXTRACTION PHACO AND INTRAOCULAR LENS PLACEMENT (IOC);  Surgeon: Tonny Branch, MD;  Location: [facility] ORS;  Service: Ophthalmology;  Laterality: Right;  CDE: 13.30  . CATARACT EXTRACTION W/PHACO Left 05/13/2014   Procedure: CATARACT EXTRACTION PHACO AND INTRAOCULAR LENS PLACEMENT LEFT EYE;  Surgeon: Tonny Branch, MD;  Location: [facility] ORS;  Service: Ophthalmology;  Laterality: Left;  CDE:13.00  . CHOLECYSTECTOMY N/A 01/15/2015   Procedure: LAPAROSCOPIC CHOLECYSTECTOMY;  Surgeon: Ralene Ok, MD;  Location: [facility];  Service: General;  Laterality: N/A;  . COLONOSCOPY N/A 09/29/2015   Procedure: COLONOSCOPY;  Surgeon: Rogene Houston, MD;  Location: [facility];  Service: Endoscopy;  Laterality: N/A;  . COLONOSCOPY    . ESOPHAGOGASTRODUODENOSCOPY N/A 01/13/2015   Procedure: ESOPHAGOGASTRODUODENOSCOPY (EGD);  Surgeon: Jerene Bears, MD;  Location: [facility] ENDOSCOPY;  Service: Endoscopy;  Laterality: N/A;  . ESOPHAGOGASTRODUODENOSCOPY N/A 09/29/2015   Procedure: ESOPHAGOGASTRODUODENOSCOPY (EGD);  Surgeon: Rogene Houston, MD;  Location[facility];  Service: Endoscopy;  Laterality: N/A;  12:15  . GALLBLADDER SURGERY    . LEFT HEART CATH AND CORONARY ANGIOGRAPHY N/A 06/22/2016   Procedure: Left Heart Cath and Coronary Angiography;  Surgeon: Troy Sine, MD;  Location: [facility] CV LAB;  Service: Cardiovascular;  Laterality: N/A;  . SHOULDER OPEN ROTATOR CUFF REPAIR     bilateral  . TOTAL KNEE ARTHROPLASTY  05/26/10   right  . TOTAL KNEE ARTHROPLASTY  11/17/2010   Procedure: TOTAL KNEE ARTHROPLASTY;  Surgeon: Mauri Pole;  Location: [facility] ORS;  Service: Orthopedics;  Laterality: Left;  . TOTAL KNEE ARTHROPLASTY     Right  . TOTAL SHOULDER REPLACEMENT  01/2010   left     OB History    Gravida  1   Para  1   Term  1   Preterm      AB      Living        SAB      TAB      Ectopic      Multiple      Live Births               Home Medications    Prior to Admission medications   Medication Sig Start Date End Date Taking? Authorizing Provider  Acetaminophen (TYLENOL) 325 MG CAPS Take 650 mg by mouth every 6 (six) hours as needed for mild pain.    [provider]  ARTIFICIAL TEAR OP Apply 2 drops to eye daily as needed ([place]). To the left eye    [provider]  atorvastatin (LIPITOR) 20 MG tablet TAKE 1 TABLET BY MOUTH AT  BEDTIME 10/08/16   Timmothy Euler, MD  flecainide (TAMBOCOR) 50 MG tablet Take 0.5 tablets (25 mg total) by mouth 2 (two) times daily. 03/17/17   Regalado, Belkys A, MD  HYDROcodone-acetaminophen (NORCO/VICODIN) 5-325 MG tablet Take 1 tablet by mouth every 6 (six) hours as needed for severe pain. 06/23/17   Carmin Muskrat, MD  irbesartan (AVAPRO) 150 MG tablet TAKE 1 TABLET DAILY 06/16/17   Timmothy Euler, MD  levETIRAcetam (KEPPRA) 250 MG tablet TAKE 1 TABLET BY MOUTH TWO  TIMES DAILY 04/14/17   Timmothy Euler, MD  levothyroxine (SYNTHROID, LEVOTHROID) 50 MCG tablet Take 1 tablet daily before breakfast 06/13/17   Ronnie Doss M, DO  metoprolol  succinate (TOPROL-XL) 50 MG 24 hr tablet Take 1 tablet (50 mg total) by mouth daily. Take with or immediately following a meal. 06/22/17 09/20/17  Minus Breeding, MD  mirtazapine (REMERON) 15 MG tablet TAKE ONE TABLET AT BEDTIME 02/17/17   Timmothy Euler, MD  pantoprazole (PROTONIX) 40 MG tablet TAKE 1 TABLET DAILY 01/11/17   Timmothy Euler, MD  potassium chloride SA (K-DUR,KLOR-CON) 20 MEQ tablet Take 0.5 tablets (10 mEq total) by mouth daily. 03/17/17   Regalado, Belkys A, MD  traMADol (ULTRAM) 50 MG tablet Take 1 tablet (50 mg total) by mouth every 12 (twelve) hours as needed. 06/13/17   Janora Norlander, DO  traZODone (DESYREL) 50 MG tablet TAKE 1 TABLET AT BEDTIME AS NEEDED FOR  SLEEP 05/19/17   Timmothy Euler, MD  warfarin (COUMADIN) 5 MG tablet Take 1 tablet (5 mg total) by mouth daily. 03/17/17   Regalado, Cassie Freer, MD    Family History Family History  Problem Relation Age of Onset  . COPD Brother   . Atrial fibrillation Brother   . Diabetes Brother   . Liver cancer Brother        Alcohol-related  . Colon cancer Brother   . Heart attack Father   . Early death Father   . Rheum arthritis Mother   . Arthritis Mother   . Heart disease Mother   . Coronary artery disease Brother   . Heart attack Daughter   . Gallbladder disease Brother     Social History Social History   Tobacco Use  . Smoking status: Never Smoker  . Smokeless tobacco: Never Used  Substance Use Topics  . Alcohol use: No  . Drug use: No     Allergies   Penicillins   Review of Systems Review of Systems  Constitutional:       Per HPI, otherwise negative  HENT:       Per HPI, otherwise negative  Respiratory:       Per HPI, otherwise negative  Cardiovascular:       Per HPI, otherwise negative  Gastrointestinal: Negative for vomiting.  Endocrine:       Negative aside from HPI  Genitourinary:       Neg aside from HPI   Musculoskeletal:       Per HPI, otherwise negative  Skin: Negative.     Neurological: Negative for syncope.     Physical Exam Updated Vital Signs BP (!) 195/77   Pulse 70   Temp 97.6 F (36.4 C) (Oral)   Resp 18   Wt 64.9 kg (143 lb)   SpO2 96%   BMI 26.16 kg/m   Physical Exam  Constitutional: She is oriented to person, place, and time. She has a sickly appearance. No distress.  HENT:  Head: Normocephalic and atraumatic.  Eyes: Conjunctivae and EOM are normal.  Cardiovascular: Normal rate and regular rhythm.  Pulmonary/Chest: Effort normal and breath sounds normal. No stridor. No respiratory distress.  No gross deformity  Abdominal: She exhibits no distension.  Musculoskeletal: She exhibits no edema.  Neurological: She is alert and oriented to person, place, and time. She displays atrophy. No cranial nerve deficit.  Skin: Skin is warm and dry.  Psychiatric: She has a normal mood and affect.  Nursing note and vitals reviewed.    ED Treatments / Results  Labs (all labs ordered are listed, but only abnormal results are displayed) Labs Reviewed - No data to display  EKG None  Radiology Dg Chest 2 View  Result Date: 06/23/2017 CLINICAL DATA:  Status post fall today with anterior chest pain EXAM: CHEST - 2 VIEW COMPARISON:  March 15 2017 FINDINGS: The heart size and mediastinal contours are within normal limits. Both lungs are clear. There is question fracture of a lower lateral right rib. Left shoulder replacement is unchanged. IMPRESSION: No focal pneumonia. Question fracture of a lower lateral right rib. Electronically Signed   By: Abelardo Diesel M.D.   On: 06/23/2017 15:49    Procedures Procedures (including critical care time)  Medications Ordered in ED Medications  morphine 4 MG/ML injection 4 mg (4 mg Intramuscular Given 06/23/17 1525)     Initial Impression / Assessment and Plan / ED Course  I have reviewed the triage  vital signs and the nursing notes.  Pertinent labs & imaging results that were available during my care of the  patient were reviewed by me and considered in my medical decision making (see chart for details).     4:31 PM On repeat exam the patient is awake and alert, feels better after receiving morphine. She remains in no distress, no hypoxia. We discussed today's evaluation, including x-ray evidence for rib fracture. Without other notable findings, with no hypoxia, no early evidence for other pathology, no evidence for pneumothorax, patient was discharged in stable condition with analgesia, outpatient follow-up.  Final Clinical Impressions(s) / ED Diagnoses   Final diagnoses:  Fall, initial encounter  Closed fracture of one rib of right side, initial encounter    ED Discharge Orders        Ordered    HYDROcodone-acetaminophen (NORCO/VICODIN) 5-325 MG tablet  Every 6 hours PRN     06/23/17 1631       Carmin Muskrat, MD 06/23/17 [phone]

## 2017-06-23 NOTE — ED Notes (Signed)
Taken to xray.

## 2017-06-24 ENCOUNTER — Telehealth: Payer: Self-pay | Admitting: Family Medicine

## 2017-06-24 ENCOUNTER — Other Ambulatory Visit: Payer: Self-pay | Admitting: *Deleted

## 2017-06-24 ENCOUNTER — Encounter: Payer: Self-pay | Admitting: *Deleted

## 2017-06-24 ENCOUNTER — Encounter: Payer: Self-pay | Admitting: Cardiology

## 2017-06-24 NOTE — Telephone Encounter (Signed)
Unfortunately I cannot order home health without a face-to-face evaluation due to Medicare regulations.  I will refer her to see if we can get a home evaluation by medical provider today or Monday.  She will need family support in the meantime.  If she is not able to have family support, I would recommend that she be seen in the emergency room as she may not be safe to be home for the weekend.  Laroy Apple, MD [facility] 06/24/2017, 12:08 PM

## 2017-06-24 NOTE — Telephone Encounter (Signed)
Refer to other phone note . 

## 2017-06-24 NOTE — Telephone Encounter (Signed)
[name] is working on referral to home health. Patient aware will call her with more details.

## 2017-06-27 ENCOUNTER — Other Ambulatory Visit: Payer: Self-pay | Admitting: *Deleted

## 2017-06-27 ENCOUNTER — Other Ambulatory Visit: Payer: Self-pay | Admitting: Family Medicine

## 2017-06-27 NOTE — Patient Outreach (Signed)
Today, Madeliene and I went through her medications and consolidated what we could. WE threw away a lot of empty bottles and put away furosemide that she is currently not taking. Filled med box.  She is still in a lot of pain and is reluctant to take any pain med that is not OTC. I gave her a tramadol.  She was out of pantoprazole and we reordered that. I asked the pharmacist to hold off on sending any more meds she has enough for 6 months.  I will come again next week and set up a new medbox.  [name]. [name], MSN, [facility] Gerontological Nurse Practitioner [facility] Care Management [phone]

## 2017-06-28 ENCOUNTER — Encounter: Payer: Self-pay | Admitting: *Deleted

## 2017-06-28 NOTE — Patient Outreach (Addendum)
[facility]) Care Management   06/28/2017  [name] 1932-08-21 [id number]  [name] is an 84 y.o. female  Subjective:  ACUTE HOME VISIT PER REQUEST DR. BRADSHAW. PT HAD A FALL AND PROBABLE R RIB FRACTURE, IN TOO MUCH PAIN TO GO TO MD OFFICE.  Objective:   Review of Systems  Constitutional: Negative.   HENT: Negative.   Eyes: Negative.   Respiratory: Negative.   Cardiovascular:       Irregular rhythm.  Gastrointestinal: Negative.   Genitourinary: Negative.   Musculoskeletal: Positive for falls.       Severe R rib cage pain, ? Rib fracture, post fall.  Skin: Negative.   Neurological: Negative.   Endo/Heme/Allergies: Bruises/bleeds easily.  Psychiatric/Behavioral: Negative.    BP 120/60 (BP Location: Left Arm, Patient Position: Sitting, Cuff Size: Normal)   Pulse 74   Resp 18   Ht 1.651 m (5\' 5" )   Wt 142 lb (64.4 kg)   SpO2 96%   BMI 23.63 kg/m   Physical Exam  Constitutional: She is oriented to person, place, and time. She appears well-developed and well-nourished.  Cardiovascular:  AFIB  Respiratory: Effort normal and breath sounds normal.  GI: Soft. Bowel sounds are normal.  Musculoskeletal:  Right riib cage tenderness/pain  Neurological: She is alert and oriented to person, place, and time.  Skin: Skin is warm and dry.  Psychiatric: She has a normal mood and affect.    Encounter Medications:   Outpatient Encounter Medications as of 06/24/2017  Medication Sig Note  . Acetaminophen (TYLENOL) 325 MG CAPS Take 650 mg by mouth every 6 (six) hours as needed for mild pain.   Marland Kitchen ARTIFICIAL TEAR OP Apply 2 drops to eye daily as needed ([place]ry[place]). To the left eye   . atorvastatin (LIPITOR) 20 MG tablet TAKE 1 TABLET BY MOUTH AT  BEDTIME   . flecainide (TAMBOCOR) 50 MG tablet Take 0.5 tablets (25 mg total) by mouth 2 (two) times daily.   . irbesartan (AVAPRO) 150 MG tablet TAKE 1 TABLET DAILY   . levETIRAcetam (KEPPRA) 250 MG tablet TAKE 1  TABLET BY MOUTH TWO  TIMES DAILY   . levothyroxine (SYNTHROID, LEVOTHROID) 50 MCG tablet Take 1 tablet daily before breakfast   . metoprolol succinate (TOPROL-XL) 50 MG 24 hr tablet Take 1 tablet (50 mg total) by mouth daily. Take with or immediately following a meal.   . mirtazapine (REMERON) 15 MG tablet TAKE ONE TABLET AT BEDTIME   . potassium chloride SA (K-DUR,KLOR-CON) 20 MEQ tablet Take 0.5 tablets (10 mEq total) by mouth daily.   . traZODone (DESYREL) 50 MG tablet TAKE 1 TABLET AT BEDTIME AS NEEDED FOR SLEEP   . [DISCONTINUED] pantoprazole (PROTONIX) 40 MG tablet TAKE 1 TABLET DAILY   . traMADol (ULTRAM) 50 MG tablet Take 1 tablet (50 mg total) by mouth every 12 (twelve) hours as needed.   . warfarin (COUMADIN) 5 MG tablet Take 1 tablet (5 mg total) by mouth daily.   . [DISCONTINUED] HYDROcodone-acetaminophen (NORCO/VICODIN) 5-325 MG tablet Take 1 tablet by mouth every 6 (six) hours as needed for severe pain. 06/24/2017: Deleted by accident.   No facility-administered encounter medications on file as of 06/24/2017.     Functional Status:   In your present state of health, do you have any difficulty performing the following activities: 06/24/2017 03/15/2017  Hearing? Y Y  Comment - -  Vision? N N  Difficulty concentrating or making decisions? Y N  Comment - -  Walking or climbing stairs? Y N  Comment - -  Dressing or bathing? Y N  Doing errands, shopping? Y N  Preparing Food and eating ? Y -  Using the Toilet? N -  In the past six months, have you accidently leaked urine? - -  Do you have problems with loss of bowel control? N -  Managing your Medications? Y -  Managing your Finances? N -  Housekeeping or managing your Housekeeping? Y -  Some recent data might be hidden    Fall/Depression Screening:    Fall Risk  06/24/2017 06/13/2017 06/02/2017  Falls in the past year? - No Yes  Number falls in past yr: - - 1  Injury with Fall? - - Yes  Comment - - -  Risk Factor Category  -  - High Fall Risk  Risk for fall due to : History of fall(s);Impaired balance/gait;Impaired mobility - -  Follow up - - -   PHQ 2/9 Scores 06/24/2017 06/13/2017 06/02/2017 04/21/2017 04/19/2017 03/21/2017 03/08/2017  PHQ - 2 Score 1 0 0 0 0 0 0  PHQ- 9 Score - - - - - - -    Assessment:  R RIB FRACTURE                         Probable noncompliance with medication regimen due to confusion.  [facility] CM Care Plan Problem One     Most Recent Value  Care Plan Problem One  Fall with rib fracture.  Role Documenting the Problem One  Care Management [place] for Problem One  Active  [facility] Long Term Goal   Pt will follow instructions to guard her R side and cough and deep breath to avoid pneumonia over the next 6 weeks.  [facility] Long Term Goal Start Date  06/24/17  Interventions for Problem One Long Term Goal  Taught pt how to guard with small pillow when moving and coughing, educated on importance of deep breathing and coughing to avoid penumonia, encouraged to take pain med as needed  to assist with movement, take increased stool softner to avoid constipation.  [facility] CM Short Term Goal #1   Pt will not fall over next 30 days.  [facility] CM Short Term Goal #1 Start Date  06/24/17  Interventions for Short Term Goal #1  Provided fall safety information. Evaluated home for safety issues.    [facility] CM Care Plan Problem Two     Most Recent Value  Care Plan Problem Two  Medication management.  Role Documenting the Problem Two  Care Management Coordinator  Care Plan for Problem Two  Active  Interventions for Problem Two Long Term Goal   Went through all pt meds. Combined bottles of like meds, Threw  away empty bottles. Will provide new med box which will have qid dosing capabilites.  [facility] Long Term Goal  We will experiment with medication administration: med box filled by NP vs med compliance packaging by pharmacy over the next 60 days to discover best medthod for pt to be compliant.  [facility] Long Term Goal Start Date   06/27/17  [facility] CM Short Term Goal #1   Pt will demonstrate that she is taking her meds as directed by review of her medication box in one week.  [facility] CM Short Term Goal #1 Start Date  06/27/17     Thank you for the referral Dr. Wendi Snipes, this was a very appropriate request! I will continue to  follow for care management services.  Eulah Pont. [name], MSN, [facility] Gerontological Nurse Practitioner [facility] Care Management [phone]

## 2017-06-29 NOTE — Addendum Note (Signed)
Addended by: Deloria Lair on: 06/29/2017 06:51 PM   Modules accepted: Orders

## 2017-06-30 ENCOUNTER — Ambulatory Visit (INDEPENDENT_AMBULATORY_CARE_PROVIDER_SITE_OTHER): Payer: Medicare Other | Admitting: Orthopaedic Surgery

## 2017-07-01 ENCOUNTER — Other Ambulatory Visit: Payer: Self-pay | Admitting: Family Medicine

## 2017-07-04 ENCOUNTER — Ambulatory Visit (INDEPENDENT_AMBULATORY_CARE_PROVIDER_SITE_OTHER): Payer: Medicare Other | Admitting: Family Medicine

## 2017-07-04 ENCOUNTER — Other Ambulatory Visit: Payer: Self-pay | Admitting: *Deleted

## 2017-07-04 ENCOUNTER — Ambulatory Visit: Payer: Self-pay | Admitting: *Deleted

## 2017-07-04 ENCOUNTER — Encounter: Payer: Self-pay | Admitting: Family Medicine

## 2017-07-04 VITALS — BP 156/77 | HR 63 | Temp 96.8°F | Ht 65.0 in | Wt 141.4 lb

## 2017-07-04 DIAGNOSIS — R0781 Pleurodynia: Secondary | ICD-10-CM | POA: Diagnosis not present

## 2017-07-04 MED ORDER — TRAMADOL HCL 50 MG PO TABS: 50.0000 mg | ORAL_TABLET | Freq: Two times a day (BID) | ORAL | 0 refills | Status: DC | PRN

## 2017-07-04 NOTE — Telephone Encounter (Signed)
Last seen 6/10  Dr Darnell Level  Dr Wendi Snipes PCP

## 2017-07-04 NOTE — Progress Notes (Signed)
   HPI  Patient presents today for continued rib pain.  Patient states that since leaving the hospital she has had continued rib pain.  She does seem to be getting better with tramadol.  She has been working with geriatric nurse practitioner Bradd Canary who has been helping her with pillboxes, evaluation, treatment, and encouraging her to take her medications.  Patient states that she believes tramadol is helpful for her pain, however she is hesitant to take it.  She is worried about getting addicted to the medication.  Her appetite is poor. She is breathing normally.  PMH: Smoking status noted ROS: Per HPI  Objective: BP (!) 156/77   Pulse 63   Temp (!) 96.8 F (36 C) (Oral)   Ht 5\' 5"  (1.651 m)   Wt 141 lb 6.4 oz (64.1 kg)   BMI 23.53 kg/m  Gen: NAD, alert, cooperative with exam HEENT: NCAT CV: RRR, good S1/S2, no murmur Chest wall: TTP R rib cage Resp: CTABL, no wheezes, non-labored Abd: SNTND, BS present, no guarding or organomegaly Ext: No edema, warm Neuro: Alert and oriented, No gross deficits  Assessment and plan:  #Rib pain Previous rib fracture Discussed pain medication, recommended that she try the tramadol more consistently. Follow-up 1 to 2 weeks for repeat check of her ribs as well as INR Appreciate Bradd Canary' help in eval and management   Meds ordered this encounter  Medications  . traMADol (ULTRAM) 50 MG tablet    Sig: Take 1 tablet (50 mg total) by mouth every 12 (twelve) hours as needed.    Dispense:  14 tablet    Refill:  0    Laroy Apple, MD [facility] 07/04/2017, 4:04 PM

## 2017-07-04 NOTE — Patient Instructions (Signed)
Come back in 1-2 weeks for recheck of your ribs and INR  Use tramadol 1 pill twice daily for pain, cut back to once a day when you can based on when the pain is better.

## 2017-07-05 ENCOUNTER — Other Ambulatory Visit: Payer: Self-pay

## 2017-07-05 NOTE — Patient Outreach (Signed)
[place] [facility]) Care Management  07/05/2017  [name] 06-14-1932 [id number]  Initial outreach to patient regarding social work referral for in-home aide assistance. BSW spoke with patient's spouse, Mr. [name].  Mr. [name] reported that a family member is able to help them fairly regularly but additional help would be beneficial. BSW talked with Mr. [name] about multiple in-home options and informed him that, without Medicaid, these services would be private pay.  Mr. [name] reported that he and patient are not able to afford this and that he would like to apply for Medicaid.  He agreed to [name] information about in-home aide options but acknowledged that they likely cannot afford them.  In-home aide options and Medicaid application was mailed.  BSW will follow up next week to ensure receipt and answer any additional questions.  Ronn Melena, BSW Social Worker [phone]

## 2017-07-05 NOTE — Patient Outreach (Signed)
Routine home visit and follow up to assess pt pain management and medication administration. (Visit done on 07/04/17)  BP (!) 150/64   Pulse 78   Resp 18   SpO2 96%   Evaluated med box which should be empty today. Multiple doses of meds are still in the box. It appears she did not take 3 am doses and 4 pm doses. In evaluating what is in those individual boxes there is evidence that they have been moved around.  Today, I provided a weekly and 4 dose/day med box. I have filled this and gave instructions to Mr. [name] to oversee that she is taking her medications appropriately. I will re-evaluate in one week. If there continues to be a problem, [employer] will do their compliance packaging. If I see that Mr. [name] is able to handle this, I will oversee him filling the box himself next week.  I have encouraged Mrs. Downard to take her tramadol on a regular basis twice daily or if she does not feel she needs the tramadol she may take acetaminophen.   I have instructed she and her husband that neither of them should take NSAIDS (ibuprofen, alleve, etc.) and explained that these medications are harmful to our kidneys as we age.  I have also stopped her trazadone at hs for this week, as she is also taking remeron hs and I want to see if she can rest with this alone. I would like to minimize her med list if we can.  Eulah Pont. [name], MSN, [facility] Gerontological Nurse Practitioner [facility] Care Management [phone]

## 2017-07-11 ENCOUNTER — Other Ambulatory Visit: Payer: Self-pay | Admitting: *Deleted

## 2017-07-12 ENCOUNTER — Other Ambulatory Visit: Payer: Self-pay

## 2017-07-12 NOTE — Patient Outreach (Signed)
[facility]) Care Management  07/12/2017  [name] 07/30/1932 [id number]   Follow up call to ensure receipt of resources mailed last week.  BSW spoke with patient's spouse, [name] who reported they have not yet received resources.  BSW will call again before the end of the week to ensure receipt and mail again if necessary.   Ronn Melena, BSW Social Worker [phone]

## 2017-07-12 NOTE — Patient Outreach (Addendum)
[facility] [place] [facility]) Care Management  07/12/2017  YAKELIN GRENIER [birth year] [id number]   Home visit to evaluate pt's medication compliance. In reviewing her medication box, she has missed 7 out of 28 doses of meds. In addition to that, some of those remaining have been rearranged or cut in half. Mrs. Parfitt was not aware that she had missed so many doses and she cannot offer a reason why the remaining meds seem to have been moved around.  I have filled her med box again. This time, I have combined her evening and bedtime medication, since she states she takes them all at 9 pm.  I will call [facility] to see how much they will charge to prepare compliance packaging. I think we could make her meds on a bid dosing schedule except for her levothyroxine which needs to be taken before breakfast.  I will advise pt of the cost and steps to take to get [facility] to start her compliance packaging.  Rib fx: pt is moving about much easier today. Husband reports she is able to get up and down without assistance. She takes her prn tramadol or APAP as needed. I have encouraged her to take it routinely to avoid swings in her pain level until she feels she can reduce the medication and then she can taper off altogether.  I will see pt again in one week.  BP 140/70   Pulse 78   SpO2 94%   Ashiya Kinkead C. [name], MSN, [facility] Gerontological Nurse Practitioner [facility] Care Management [phone]

## 2017-07-15 ENCOUNTER — Ambulatory Visit: Payer: Self-pay

## 2017-07-18 ENCOUNTER — Other Ambulatory Visit: Payer: Self-pay | Admitting: *Deleted

## 2017-07-18 NOTE — Patient Outreach (Signed)
Routine home visit. Pt rib pain is finally starting to feel better. Now it is only exacerbated with deep breathing. She did better on her medication regimen. She only missed 2 doses this week.  BP 120/80   Pulse 71   Resp 16   SpO2 96%  Pt is in AFIB Lungs are clear  No edema  Fixed med box for 2 days. [employer] to do compliance packaging from now on except her levothyroxine which she can dose herself before breakfast. So they will package her meds for bid dosing. We will include a tramadol in the pm for 2 weeks.  I will call pt next week.  [name]. [name], MSN, [facility] Gerontological Nurse Practitioner [facility] Care Management [phone]

## 2017-07-20 ENCOUNTER — Other Ambulatory Visit: Payer: Self-pay

## 2017-07-20 NOTE — Patient Outreach (Signed)
[place] [facility]) Care Management  07/20/2017  [name] [birth year] [id number]   Follow up call to ensure receipt of Medicaid application and in-home aide options.  Patient denied receiving this information so it was mailed again today.  Will follow up next week to ensure receipt.   Ronn Melena, BSW Social Worker [phone]

## 2017-07-21 ENCOUNTER — Ambulatory Visit (INDEPENDENT_AMBULATORY_CARE_PROVIDER_SITE_OTHER): Payer: Medicare Other | Admitting: Orthopaedic Surgery

## 2017-07-25 ENCOUNTER — Other Ambulatory Visit: Payer: Self-pay | Admitting: Family Medicine

## 2017-07-27 ENCOUNTER — Ambulatory Visit: Payer: Medicare Other | Admitting: Physical Therapy

## 2017-07-27 ENCOUNTER — Other Ambulatory Visit: Payer: Self-pay

## 2017-07-27 NOTE — Patient Outreach (Signed)
[place] [facility]) Care Management  07/27/2017  [name] 03/01/1932 [id number]   Follow up call to patient's spouse, [name] to ensure receipt of Medicaid application and list of in-home aide providers that was mailed.  Mr. [name] confirmed receipt but said he does not feel competent to complete the Medicaid application.  BSW encouraged Mr. [name] to go to [place] to apply in person because the case workers can assist and answer any questions that he has.  BSW encouraged him to call first to inquire about what documentation should be provided with the application.  Mr. [name] verbalized understanding of this.  BSW is closing at this time due to no other social work needs being identified.    Ronn Melena, BSW Social Worker [phone]

## 2017-07-29 ENCOUNTER — Ambulatory Visit (INDEPENDENT_AMBULATORY_CARE_PROVIDER_SITE_OTHER): Payer: Medicare Other | Admitting: Pharmacist Clinician (PhC)/ Clinical Pharmacy Specialist

## 2017-07-29 DIAGNOSIS — I480 Paroxysmal atrial fibrillation: Secondary | ICD-10-CM | POA: Diagnosis not present

## 2017-07-29 LAB — COAGUCHEK XS/INR WAIVED
INR: 3.3 — AB (ref 0.9–1.1)
PROTHROMBIN TIME: 39.2 s

## 2017-07-29 NOTE — Patient Instructions (Signed)
Description   Change to taking 1/2 tablet every day except for Mondays take 1 tablet.  Today's INR is 3.3  (Goal is 2-3)  It is just slightly thin today

## 2017-08-08 ENCOUNTER — Other Ambulatory Visit: Payer: Self-pay | Admitting: *Deleted

## 2017-08-08 NOTE — Patient Outreach (Signed)
Routine home visit. Pt denies any rib pain today. She is concerned about her husband who is having a fem/pop bipass tomorrow. Pharmacy compliance packaging is working very well for her.  O:  BP 130/70   Pulse 88   Resp 18   SpO2 96%   AFIB Lungs clear  A:  AFIB       CHF    P:  I will continue to see Dae whenever I see Thayer Jew, but my focus will be on Thayer Jew from now on.  Encouraged Merrianne to call a family member or friend to come spend time with her while Thayer Jew is in the hospital and regenerate herself with some rest while he is gone.  [facility] CM Care Plan Problem One     Most Recent Value  Care Plan Problem One  Fall with rib fracture.  (Pended)   Role Documenting the Problem One  Care Management Coordinator  (Pended)   [place] for Problem One  Active  (Pended)   THN Long Term Goal   Pt will follow instructions to guard her R side and cough and deep breath to avoid pneumonia over the next 6 weeks.  (Pended)   THN Long Term Goal Start Date  06/24/17  (Pended)   THN Long Term Goal Met Date  07/18/17  (Pended)   THN CM Short Term Goal #1   Pt will not fall over next 30 days.  (Pended)   THN CM Short Term Goal #1 Start Date  06/24/17  (Pended)     [facility] CM Care Plan Problem Two     Most Recent Value  Care Plan Problem Two  Medication management.  (Pended)   Role Documenting the Problem Two  Care Management Coordinator  (Pended)   Care Plan for Problem Two  Active  (Pended)   THN Long Term Goal  We will experiment with medication administration: med box filled by NP vs med compliance packaging by pharmacy over the next 60 days to discover best medthod for pt to be compliant.  (Pended)   THN Long Term Goal Start Date  06/27/17  (Pended)   THN CM Short Term Goal #1   Pt will demonstrate that she is taking her meds as directed by review of her medication box in one week.  (Pended)   THN CM Short Term Goal #1 Start Date  06/27/17  (Pended)       Kayleen Memos C. [name], MSN,  [facility] Gerontological Nurse Practitioner [facility] Care Management [phone]

## 2017-08-18 ENCOUNTER — Other Ambulatory Visit: Payer: Self-pay

## 2017-08-18 NOTE — Patient Outreach (Signed)
[place] [facility]) Care Management  08/18/2017  [name] Dec 19, 1932 [id number]   Medication Adherence call to [name] patient is due on Atorvastatin 20 mg spoke with patient she is only taking 1/2 tablet now and  gets a pill pack every week from the pharmacy. [name] is showing past due under [place].  [place] Management Direct Dial [phone]  Fax [phone] [email or web address]

## 2017-08-31 ENCOUNTER — Ambulatory Visit (INDEPENDENT_AMBULATORY_CARE_PROVIDER_SITE_OTHER): Payer: Medicare Other | Admitting: Physician Assistant

## 2017-08-31 ENCOUNTER — Encounter: Payer: Self-pay | Admitting: Physician Assistant

## 2017-08-31 VITALS — BP 141/92 | HR 125 | Temp 97.7°F | Ht 65.0 in | Wt 141.4 lb

## 2017-08-31 DIAGNOSIS — M25552 Pain in left hip: Secondary | ICD-10-CM

## 2017-08-31 DIAGNOSIS — J069 Acute upper respiratory infection, unspecified: Secondary | ICD-10-CM

## 2017-08-31 MED ORDER — AZITHROMYCIN 250 MG PO TABS: ORAL_TABLET | ORAL | 0 refills | Status: DC

## 2017-08-31 MED ORDER — DONEPEZIL HCL 5 MG PO TABS: 5.0000 mg | ORAL_TABLET | Freq: Every day | ORAL | 1 refills | Status: DC

## 2017-08-31 MED ORDER — PREDNISONE 10 MG (21) PO TBPK: ORAL_TABLET | ORAL | 0 refills | Status: DC

## 2017-09-05 NOTE — Progress Notes (Signed)
BP (!) 141/92   Pulse (!) 125   Temp 97.7 F (36.5 C) (Oral)   Ht 5\' 5"  (1.651 m)   Wt 141 lb 6.4 oz (64.1 kg)   BMI 23.53 kg/m    Subjective:    Patient ID: [name], female    DOB: 1932/08/31, 84 y.o.   MRN: [id number]  HPI: [name] is a 84 y.o. female presenting on 08/31/2017 for Hip Pain (left )  Patient comes in having left hip pain for several days.  She has had issues in the past at times but has never had to have anything major done to work.  She states it is very painful after she has been up on it.  She denies any fever or chills at this time.  This patient has had many days of sore throat and postnasal drainage, headache at times and sinus pressure. There is copious drainage at times. Denies any fever at this time. There has been a history of sinus infections in the past.  There is cough at night. It has become more prevalent in recent days.  Past Medical History:  Diagnosis Date  . Anemia   . Atrial fibrillation (HCC)    Paroxysmal, Flecainide therapy  . Calf pain    September, 2012, at rest  . Carotid bruit    Doppler, December, 2009, no abnormality  . Cataract   . Diverticulosis   . GERD (gastroesophageal reflux disease)   . History of shingles   . HLD (hyperlipidemia)   . HTN (hypertension)   . Hypothyroidism   . Insomnia   . Osteoarthritis   . PONV (postoperative nausea and vomiting)   . Rectal bleeding 2001   diverticulosis and int hemorrhoids on 07/1999 and 02/2010 colonoscopies.  . Stroke ([place])   . Warfarin anticoagulation    Relevant past medical, surgical, family and social history reviewed and updated as indicated. Interim medical history since our last visit reviewed. Allergies and medications reviewed and updated. DATA REVIEWED: CHART IN EPIC  Family History reviewed for pertinent findings.  Review of Systems  Constitutional: Positive for chills and fatigue. Negative for activity change and appetite change.  HENT: Positive for  congestion, postnasal drip and sore throat.   Eyes: Negative.   Respiratory: Positive for cough and wheezing.   Cardiovascular: Negative.  Negative for chest pain, palpitations and leg swelling.  Gastrointestinal: Negative.   Genitourinary: Negative.   Musculoskeletal: Positive for arthralgias.  Skin: Negative.   Neurological: Positive for headaches.    Allergies as of 08/31/2017      Reactions   Penicillins Rash, Other (See Comments)   Has patient had a PCN reaction causing immediate rash, facial/tongue/throat swelling, SOB or lightheadedness with hypotension: No Has patient had a PCN reaction causing severe rash involving mucus membranes or skin necrosis: No Has patient had a PCN reaction that required hospitalization No Has patient had a PCN reaction occurring within the last 10 years: No If all of the above answers are "NO", then may proceed with Cephalosporin use.      Medication List        Accurate as of 08/31/17 11:59 PM. Always use your most recent med list.          ARTIFICIAL TEAR OP Apply 2 drops to eye daily as needed (Dryness). To the left eye   atorvastatin 20 MG tablet Commonly known as:  LIPITOR TAKE 1 TABLET BY MOUTH AT  BEDTIME   azithromycin 250 MG  tablet Commonly known as:  ZITHROMAX Take as directed   donepezil 5 MG tablet Commonly known as:  ARICEPT Take 1 tablet (5 mg total) by mouth at bedtime. FOR MEMORY   flecainide 50 MG tablet Commonly known as:  TAMBOCOR Take 0.5 tablets (25 mg total) by mouth 2 (two) times daily.   irbesartan 150 MG tablet Commonly known as:  AVAPRO TAKE 1 TABLET DAILY   levETIRAcetam 250 MG tablet Commonly known as:  KEPPRA TAKE 1 TABLET BY MOUTH TWO  TIMES DAILY   levothyroxine 50 MCG tablet Commonly known as:  SYNTHROID, LEVOTHROID Take 1 tablet daily before breakfast   metoprolol succinate 50 MG 24 hr tablet Commonly known as:  TOPROL-XL Take 1 tablet (50 mg total) by mouth daily. Take with or immediately  following a meal.   mirtazapine 15 MG tablet Commonly known as:  REMERON TAKE ONE TABLET AT BEDTIME   pantoprazole 40 MG tablet Commonly known as:  PROTONIX TAKE 1 TABLET DAILY   potassium chloride SA 20 MEQ tablet Commonly known as:  K-DUR,KLOR-CON Take 0.5 tablets (10 mEq total) by mouth daily.   predniSONE 10 MG (21) Tbpk tablet Commonly known as:  STERAPRED UNI-PAK 21 TAB As directed x 6 days   traMADol 50 MG tablet Commonly known as:  ULTRAM Take 1 tablet (50 mg total) by mouth every 12 (twelve) hours as needed.   TYLENOL 325 MG Caps Generic drug:  Acetaminophen Take 650 mg by mouth every 6 (six) hours as needed for mild pain.   warfarin 5 MG tablet Commonly known as:  COUMADIN Take as directed by the anticoagulation clinic. If you are unsure how to take this medication, talk to your nurse or doctor. Original instructions:  Take 1 tablet (5 mg total) by mouth daily.          Objective:    BP (!) 141/92   Pulse (!) 125   Temp 97.7 F (36.5 C) (Oral)   Ht 5\' 5"  (1.651 m)   Wt 141 lb 6.4 oz (64.1 kg)   BMI 23.53 kg/m   Allergies  Allergen Reactions  . Penicillins Rash and Other (See Comments)    Has patient had a PCN reaction causing immediate rash, facial/tongue/throat swelling, SOB or lightheadedness with hypotension: No Has patient had a PCN reaction causing severe rash involving mucus membranes or skin necrosis: No Has patient had a PCN reaction that required hospitalization No Has patient had a PCN reaction occurring within the last 10 years: No If all of the above answers are "NO", then may proceed with Cephalosporin use.    Wt Readings from Last 3 Encounters:  08/31/17 141 lb 6.4 oz (64.1 kg)  07/04/17 141 lb 6.4 oz (64.1 kg)  06/24/17 142 lb (64.4 kg)    Physical Exam  Constitutional: She is oriented to person, place, and time. She appears well-developed and well-nourished.  HENT:  Head: Normocephalic and atraumatic.  Right Ear: A middle ear  effusion is present.  Left Ear: A middle ear effusion is present.  Nose: Mucosal edema present. Right sinus exhibits no frontal sinus tenderness. Left sinus exhibits no frontal sinus tenderness.  Mouth/Throat: Posterior oropharyngeal erythema present. No oropharyngeal exudate or tonsillar abscesses.  Eyes: Pupils are equal, round, and reactive to light. Conjunctivae and EOM are normal.  Neck: Normal range of motion.  Cardiovascular: Normal rate, regular rhythm, normal heart sounds and intact distal pulses.  Pulmonary/Chest: Effort normal and breath sounds normal.  Abdominal: Soft. Bowel sounds are  normal.  Musculoskeletal:       Left hip: She exhibits decreased range of motion and tenderness.  Neurological: She is alert and oriented to person, place, and time. She has normal reflexes.  Skin: Skin is warm and dry. No rash noted.  Psychiatric: She has a normal mood and affect. Her behavior is normal. Judgment and thought content normal.  Nursing note and vitals reviewed.   Results for orders placed or performed in visit on 07/29/17  CoaguChek XS/INR Waived  Result Value Ref Range   INR 3.3 (H) 0.9 - 1.1   Prothrombin Time 39.2 sec      Assessment & Plan:   1. Pain of left hip joint - predniSONE (STERAPRED UNI-PAK 21 TAB) 10 MG (21) TBPK tablet; As directed x 6 days  Dispense: 21 tablet; Refill: 0  2. Upper respiratory tract infection, unspecified type - azithromycin (ZITHROMAX Z-PAK) 250 MG tablet; Take as directed  Dispense: 6 each; Refill: 0 - predniSONE (STERAPRED UNI-PAK 21 TAB) 10 MG (21) TBPK tablet; As directed x 6 days  Dispense: 21 tablet; Refill: 0   Continue all other maintenance medications as listed above.  Follow up plan: Return in about 2 months (around 10/31/2017) for [place], new provider.  Educational handout given for [place] PA-C [place] [address]  [place], [place] [address] [phone]   09/05/2017, 8:22  PM

## 2017-09-14 ENCOUNTER — Other Ambulatory Visit: Payer: Self-pay | Admitting: *Deleted

## 2017-09-14 ENCOUNTER — Ambulatory Visit (INDEPENDENT_AMBULATORY_CARE_PROVIDER_SITE_OTHER): Payer: Medicare Other | Admitting: Pharmacist Clinician (PhC)/ Clinical Pharmacy Specialist

## 2017-09-14 DIAGNOSIS — I480 Paroxysmal atrial fibrillation: Secondary | ICD-10-CM | POA: Diagnosis not present

## 2017-09-14 LAB — COAGUCHEK XS/INR WAIVED
INR: 3.4 — ABNORMAL HIGH (ref 0.9–1.1)
PROTHROMBIN TIME: 40.6 s

## 2017-09-14 MED ORDER — IRBESARTAN 150 MG PO TABS: 150.0000 mg | ORAL_TABLET | Freq: Every day | ORAL | 0 refills | Status: DC

## 2017-09-14 NOTE — Patient Instructions (Signed)
Description   Take 1 tablet a day except for Mondays, Wednesdays, and Fridays take 1/2 tablet  Today's INR is 3.4  (Goal is 2-3)  It is just slightly thin today

## 2017-09-28 ENCOUNTER — Encounter: Payer: Self-pay | Admitting: Family Medicine

## 2017-09-28 ENCOUNTER — Ambulatory Visit (INDEPENDENT_AMBULATORY_CARE_PROVIDER_SITE_OTHER): Payer: Medicare Other | Admitting: Family Medicine

## 2017-09-28 VITALS — BP 139/69 | HR 80 | Temp 96.9°F | Ht 65.0 in | Wt 139.6 lb

## 2017-09-28 DIAGNOSIS — M533 Sacrococcygeal disorders, not elsewhere classified: Secondary | ICD-10-CM | POA: Diagnosis not present

## 2017-09-28 MED ORDER — METHYLPREDNISOLONE ACETATE 80 MG/ML IJ SUSP
80.0000 mg | Freq: Once | INTRAMUSCULAR | Status: AC
  Administered 2017-09-28: 80 mg via INTRAMUSCULAR

## 2017-09-28 NOTE — Progress Notes (Signed)
BP 139/69   Pulse 80   Temp (!) 96.9 F (36.1 C) (Oral)   Ht 5\' 5"  (1.651 m)   Wt 63.3 kg   BMI 23.23 kg/m    Subjective:    Patient ID: MYLI PAE, female    DOB: Mar 15, 1932, 84 y.o.   MRN: [id number]  HPI: [name] is a 84 y.o. female presenting on 09/28/2017 for Hip Pain (Left x 2-3 months on and off)  Patient's left hip and leg started bothering her about 2 weeks ago. She has intermittent, dull pain that starts in her left hip that radiates down the posterior portion of her left leg. The pain is the worst while she is on her feet, and improves when she sits or lies down. She has had these symptoms in the past, but they went away on their own. She has tried both hydrocodone and Tylenol for pain, without any relief. This has limited her mobility throughout the day. She does not have any numbness or tingling on her leg.    Relevant past medical, surgical, family and social history reviewed and updated as indicated. Interim medical history since our last visit reviewed. Allergies and medications reviewed and updated.  Review of Systems  Constitutional: Negative for chills and fever.  Respiratory: Negative for cough and shortness of breath.   Cardiovascular: Negative for chest pain.  Musculoskeletal: Positive for arthralgias (left hip). Negative for back pain and myalgias.  Neurological: Negative for dizziness, weakness and numbness.    Per HPI unless specifically indicated above   Allergies as of 09/28/2017      Reactions   Penicillins Rash, Other (See Comments)   Has patient had a PCN reaction causing immediate rash, facial/tongue/throat swelling, SOB or lightheadedness with hypotension: No Has patient had a PCN reaction causing severe rash involving mucus membranes or skin necrosis: No Has patient had a PCN reaction that required hospitalization No Has patient had a PCN reaction occurring within the last 10 years: No If all of the above answers are "NO", then may  proceed with Cephalosporin use.      Medication List        Accurate as of 09/28/17  4:24 PM. Always use your most recent med list.          ARTIFICIAL TEAR OP Apply 2 drops to eye daily as needed (Dryness). To the left eye   atorvastatin 20 MG tablet Commonly known as:  LIPITOR TAKE 1 TABLET BY MOUTH AT  BEDTIME   donepezil 5 MG tablet Commonly known as:  ARICEPT Take 1 tablet (5 mg total) by mouth at bedtime. FOR MEMORY   flecainide 50 MG tablet Commonly known as:  TAMBOCOR Take 0.5 tablets (25 mg total) by mouth 2 (two) times daily.   irbesartan 150 MG tablet Commonly known as:  AVAPRO Take 1 tablet (150 mg total) by mouth daily. (Needs to see PCP)   levETIRAcetam 250 MG tablet Commonly known as:  KEPPRA TAKE 1 TABLET BY MOUTH TWO  TIMES DAILY   levothyroxine 50 MCG tablet Commonly known as:  SYNTHROID, LEVOTHROID Take 1 tablet daily before breakfast   metoprolol succinate 50 MG 24 hr tablet Commonly known as:  TOPROL-XL Take 1 tablet (50 mg total) by mouth daily. Take with or immediately following a meal.   mirtazapine 15 MG tablet Commonly known as:  REMERON TAKE ONE TABLET AT BEDTIME   pantoprazole 40 MG tablet Commonly known as:  PROTONIX TAKE 1 TABLET DAILY  potassium chloride SA 20 MEQ tablet Commonly known as:  K-DUR,KLOR-CON Take 0.5 tablets (10 mEq total) by mouth daily.   traMADol 50 MG tablet Commonly known as:  ULTRAM Take 1 tablet (50 mg total) by mouth every 12 (twelve) hours as needed.   TYLENOL 325 MG Caps Generic drug:  Acetaminophen Take 650 mg by mouth every 6 (six) hours as needed for mild pain.   warfarin 5 MG tablet Commonly known as:  COUMADIN Take as directed by the anticoagulation clinic. If you are unsure how to take this medication, talk to your nurse or doctor. Original instructions:  Take 1 tablet (5 mg total) by mouth daily.          Objective:    BP 139/69   Pulse 80   Temp (!) 96.9 F (36.1 C) (Oral)   Ht  5\' 5"  (1.651 m)   Wt 63.3 kg   BMI 23.23 kg/m   Wt Readings from Last 3 Encounters:  09/28/17 63.3 kg  08/31/17 64.1 kg  07/04/17 64.1 kg    Physical Exam  Constitutional: She is oriented to person, place, and time. She appears well-developed and well-nourished. No distress.  Cardiovascular: Normal rate, regular rhythm, normal heart sounds and intact distal pulses.  Pulmonary/Chest: Effort normal and breath sounds normal.  Musculoskeletal: Normal range of motion.       Left hip: She exhibits tenderness (in the posterior portion of left hip while standing). She exhibits normal range of motion (normal passive range of motion causing some pain) and normal strength.       Legs: Neurological: She is alert and oriented to person, place, and time.  Skin: She is not diaphoretic.  Psychiatric: She has a normal mood and affect. Her behavior is normal.        Assessment & Plan:   Problem List Items Addressed This Visit    None    Visit Diagnoses    Sacroiliac joint dysfunction of left side    -  Primary   Relevant Medications   methylPREDNISolone acetate (DEPO-MEDROL) injection 80 mg (Start on 09/28/2017  4:30 PM)      SI joint dysfunction Patient had steroid injection today to help relieve some of the inflammation in her SI joint. She was counseled to take 0.5 a dose of Coumadin for the next 2 days because of the prednisone shot. She will follow up for her INR check next week. If she continues to have pain, we discussed starting physical therapy in the future. Patient told to return if her symptoms worsen.  Follow up plan: Return in about 1 week (around 10/05/2017), or if symptoms worsen or fail to improve, for Follow-up with [facility] for Coumadin recheck.  Counseling provided for all of the vaccine components No orders of the defined types were placed in this encounter.  Ree Kida, PA-S  Patient seen and examined with Rejeana Brock, PA student, agree with assessment  and plan above.  Instructed patient to see orthopedic if does not improve or return here. Caryl Pina, MD [facility] 09/28/2017, 4:24 PM

## 2017-09-30 ENCOUNTER — Telehealth: Payer: Self-pay

## 2017-09-30 DIAGNOSIS — M25552 Pain in left hip: Secondary | ICD-10-CM

## 2017-09-30 NOTE — Telephone Encounter (Signed)
Husband aware of referral to ortho

## 2017-09-30 NOTE — Telephone Encounter (Signed)
Hip worse after injection   Can you give her something else?

## 2017-09-30 NOTE — Telephone Encounter (Signed)
We are very limited because of her Coumadin, please do referral to orthopedic for her

## 2017-10-05 ENCOUNTER — Telehealth: Payer: Self-pay | Admitting: Pharmacist Clinician (PhC)/ Clinical Pharmacy Specialist

## 2017-10-05 ENCOUNTER — Encounter: Payer: Self-pay | Admitting: Pharmacist Clinician (PhC)/ Clinical Pharmacy Specialist

## 2017-10-05 NOTE — Telephone Encounter (Signed)
Patient missed her INR appointment.  I called her and she said Mr. [name] is in the hospital having surgery and she does not drive.  I told her if she can get a ride over today between now and 3pm I will work her in.  If not, she will come next Wednesday.

## 2017-10-10 ENCOUNTER — Other Ambulatory Visit: Payer: Self-pay | Admitting: Family Medicine

## 2017-10-11 ENCOUNTER — Ambulatory Visit (INDEPENDENT_AMBULATORY_CARE_PROVIDER_SITE_OTHER): Payer: Medicare Other | Admitting: Family Medicine

## 2017-10-11 ENCOUNTER — Encounter: Payer: Self-pay | Admitting: Family Medicine

## 2017-10-11 VITALS — BP 140/84 | HR 125 | Temp 98.0°F | Ht 65.0 in | Wt 140.0 lb

## 2017-10-11 DIAGNOSIS — Z23 Encounter for immunization: Secondary | ICD-10-CM

## 2017-10-11 DIAGNOSIS — M533 Sacrococcygeal disorders, not elsewhere classified: Secondary | ICD-10-CM

## 2017-10-11 MED ORDER — PREDNISONE 20 MG PO TABS: 20.0000 mg | ORAL_TABLET | Freq: Every day | ORAL | 0 refills | Status: AC

## 2017-10-11 NOTE — Progress Notes (Signed)
Subjective:    Patient ID: [name], female    DOB: 1932-10-26, 84 y.o.   MRN: [id number]  Chief Complaint:  Left leg pain (front of leg, from groin to foot, hurts when she is up on her feet)   HPI: [name] is a 84 y.o. female presenting on 10/11/2017 for Left leg pain (front of leg, from groin to foot, hurts when she is up on her feet)  Pt presents today for ongoing left hip pain that radiates down lateral left leg. Pt was seen on 09/28/17 by Dr. Warrick Parisian for same. Physical therapy was discussed at this appointment but not scheduled. Pt states she continues to have the pain even with pain medications. She reports the pain is intermittent and dull to sharp in nature. States activity increases the pain and rest helps the pain. She denies numbness, tingling, weakness, or loss of function.    Relevant past medical, surgical, family, and social history reviewed and updated as indicated.  Allergies and medications reviewed and updated.   Past Medical History:  Diagnosis Date  . Anemia   . Atrial fibrillation (HCC)    Paroxysmal, Flecainide therapy  . Calf pain    September, 2012, at rest  . Carotid bruit    Doppler, December, 2009, no abnormality  . Cataract   . Diverticulosis   . GERD (gastroesophageal reflux disease)   . History of shingles   . HLD (hyperlipidemia)   . HTN (hypertension)   . Hypothyroidism   . Insomnia   . Osteoarthritis   . PONV (postoperative nausea and vomiting)   . Rectal bleeding 2001   diverticulosis and int hemorrhoids on 07/1999 and 02/2010 colonoscopies.  . Stroke ([place])   . Warfarin anticoagulation     Past Surgical History:  Procedure Laterality Date  . BIOPSY  09/29/2015   Procedure: BIOPSY;  Surgeon: Rogene Houston, MD;  Location: [facility];  Service: Endoscopy;;  gastric  . CATARACT EXTRACTION W/PHACO Right 04/15/2014   Procedure: CATARACT EXTRACTION PHACO AND INTRAOCULAR LENS PLACEMENT (IOC);  Surgeon: Tonny Branch, MD;   Location: [facility];  Service: Ophthalmology;  Laterality: Right;  CDE: 13.30  . CATARACT EXTRACTION W/PHACO Left 05/13/2014   Procedure: CATARACT EXTRACTION PHACO AND INTRAOCULAR LENS PLACEMENT LEFT EYE;  Surgeon: Tonny Branch, MD;  Location: [facility];  Service: Ophthalmology;  Laterality: Left;  CDE:13.00  . CHOLECYSTECTOMY N/A 01/15/2015   Procedure: LAPAROSCOPIC CHOLECYSTECTOMY;  Surgeon: Ralene Ok, MD;  Location: [place];  Service: General;  Laterality: N/A;  . COLONOSCOPY N/A 09/29/2015   Procedure: COLONOSCOPY;  Surgeon: Rogene Houston, MD;  Location: [facility];  Service: Endoscopy;  Laterality: N/A;  . COLONOSCOPY    . ESOPHAGOGASTRODUODENOSCOPY N/A 01/13/2015   Procedure: ESOPHAGOGASTRODUODENOSCOPY (EGD);  Surgeon: Jerene Bears, MD;  Location: [facility] END[facility];  Service: Endoscopy;  Laterality: N/A;  . ESOPHAGOGASTRODUODENOSCOPY N/A 09/29/2015   Procedure: ESOPHAGOGASTRODUODENOSCOPY (EGD);  Surgeon: Rogene Houston, MD;  Location: [facility];  Service: Endoscopy;  Laterality: N/A;  12:15  . GALLBLADDER SURGERY    . LEFT HEART CATH AND CORONARY ANGIOGRAPHY N/A 06/22/2016   Procedure: Left Heart Cath and Coronary Angiography;  Surgeon: Troy Sine, MD;  Location: [place] [facility];  Service: Cardiovascular;  Laterality: N/A;  . SHOULDER OPEN ROTATOR CUFF REPAIR     bilateral  . TOTAL KNEE ARTHROPLASTY  05/26/10   right  . TOTAL KNEE ARTHROPLASTY  11/17/2010   Procedure: TOTAL KNEE ARTHROPLASTY;  Surgeon: Mauri Pole;  Location: [facility];  Service: Orthopedics;  Laterality: Left;  . TOTAL KNEE ARTHROPLASTY     Right  . TOTAL SHOULDER REPLACEMENT  01/2010   left    Social History   Socioeconomic History  . Marital status: Married    Spouse name: Not on file  . Number of children: Not on file  . Years of education: Not on file  . Highest education level: Not on file  Occupational History  . Not on file  Social Needs  . Financial resource strain: Not on file  . Food insecurity:     Worry: Not on file    Inability: Not on file  . Transportation needs:    Medical: Not on file    Non-medical: Not on file  Tobacco Use  . Smoking status: Never Smoker  . Smokeless tobacco: Never Used  Substance and Sexual Activity  . Alcohol use: No  . Drug use: No  . Sexual activity: Never  Lifestyle  . Physical activity:    Days per week: Not on file    Minutes per session: Not on file  . Stress: Not on file  Relationships  . Social connections:    Talks on phone: Not on file    Gets together: Not on file    Attends religious service: Not on file    Active member of club or organization: Not on file    Attends meetings of clubs or organizations: Not on file    Relationship status: Not on file  . Intimate partner violence:    Fear of current or ex partner: Not on file    Emotionally abused: Not on file    Physically abused: Not on file    Forced sexual activity: Not on file  Other Topics Concern  . Not on file  Social History Narrative  . Not on file    Outpatient Encounter Medications as of 10/11/2017  Medication Sig  . Acetaminophen (TYLENOL) 325 MG CAPS Take 650 mg by mouth every 6 (six) hours as needed for mild pain.  Marland Kitchen ARTIFICIAL TEAR OP Apply 2 drops to eye daily as needed (Dryness). To the left eye  . atorvastatin (LIPITOR) 20 MG tablet TAKE 1 TABLET BY MOUTH AT  BEDTIME  . donepezil (ARICEPT) 5 MG tablet Take 1 tablet (5 mg total) by mouth at bedtime. FOR MEMORY  . flecainide (TAMBOCOR) 50 MG tablet Take 0.5 tablets (25 mg total) by mouth 2 (two) times daily.  . irbesartan (AVAPRO) 150 MG tablet TAKE 1 TABLET EVERY DAY (Needs to see PCP)  . levETIRAcetam (KEPPRA) 250 MG tablet TAKE 1 TABLET BY MOUTH TWO  TIMES DAILY  . levothyroxine (SYNTHROID, LEVOTHROID) 50 MCG tablet Take 1 tablet daily before breakfast  . mirtazapine (REMERON) 15 MG tablet TAKE ONE TABLET AT BEDTIME  . pantoprazole (PROTONIX) 40 MG tablet TAKE 1 TABLET DAILY  . potassium chloride SA  (K-DUR,KLOR-CON) 20 MEQ tablet Take 0.5 tablets (10 mEq total) by mouth daily.  . traMADol (ULTRAM) 50 MG tablet Take 1 tablet (50 mg total) by mouth every 12 (twelve) hours as needed.  . warfarin (COUMADIN) 5 MG tablet Take 1 tablet (5 mg total) by mouth daily.  . metoprolol succinate (TOPROL-XL) 50 MG 24 hr tablet Take 1 tablet (50 mg total) by mouth daily. Take with or immediately following a meal.  . predniSONE (DELTASONE) 20 MG tablet Take 1 tablet (20 mg total) by mouth daily with breakfast for 5  days.   No facility-administered encounter medications on file as of 10/11/2017.     Allergies  Allergen Reactions  . Penicillins Rash and Other (See Comments)    Has patient had a PCN reaction causing immediate rash, facial/tongue/throat swelling, SOB or lightheadedness with hypotension: No Has patient had a PCN reaction causing severe rash involving mucus membranes or skin necrosis: No Has patient had a PCN reaction that required hospitalization No Has patient had a PCN reaction occurring within the last 10 years: No If all of the above answers are "NO", then may proceed with Cephalosporin use.    Review of Systems  Constitutional: Positive for activity change. Negative for chills, fatigue and fever.  Respiratory: Negative for cough, chest tightness, shortness of breath and wheezing.   Cardiovascular: Negative for chest pain, palpitations and leg swelling.  Gastrointestinal: Negative for abdominal pain, blood in stool, constipation, diarrhea, nausea and vomiting.  Musculoskeletal: Positive for arthralgias (left hip), gait problem (due to hip and leg pain) and myalgias (left upper leg). Negative for back pain, joint swelling, neck pain and neck stiffness.  Skin: Negative for color change and wound.  Neurological: Negative for dizziness, syncope, weakness and numbness.        Objective:    BP 140/84   Pulse (!) 125   Temp 98 F (36.7 C) (Oral)   Ht 5\' 5"  (1.651 m)   Wt 140 lb (63.5  kg)   BMI 23.30 kg/m    Wt Readings from Last 3 Encounters:  10/11/17 140 lb (63.5 kg)  09/28/17 139 lb 9.6 oz (63.3 kg)  08/31/17 141 lb 6.4 oz (64.1 kg)    Physical Exam  Constitutional: She is oriented to person, place, and time. She appears well-developed and well-nourished. No distress.  Cardiovascular: Normal rate, normal heart sounds, intact distal pulses and normal pulses. An irregularly irregular rhythm present.  Pulmonary/Chest: Effort normal and breath sounds normal. No respiratory distress.  Musculoskeletal:       Left hip: She exhibits decreased range of motion and tenderness. She exhibits normal strength, no swelling, no crepitus, no deformity and no laceration.       Legs: Neurological: She is alert and oriented to person, place, and time. She has normal reflexes. No sensory deficit.  Skin: Skin is warm and dry. Capillary refill takes less than 2 seconds.  Psychiatric: She has a normal mood and affect. Her behavior is normal. Judgment and thought content normal.  Nursing note and vitals reviewed.   Results for orders placed or performed in visit on 09/14/17  CoaguChek XS/INR Waived  Result Value Ref Range   INR 3.4 (H) 0.9 - 1.1   Prothrombin Time 40.6 sec       Pertinent labs & imaging results that were available during my care of the patient were reviewed by me and considered in my medical decision making.  Assessment & Plan:  Pegi was seen today for left leg pain.  Diagnoses and all orders for this visit:  Sacroiliac joint dysfunction of left side -     Ambulatory referral to Physical Therapy -     predniSONE (DELTASONE) 20 MG tablet; Take 1 tablet (20 mg total) by mouth daily with breakfast for 5 days.  Encounter for immunization -     Flu vaccine HIGH DOSE PF    Continue all other maintenance medications.  Follow up plan: Return if symptoms worsen or fail to improve.  Educational handout given for SI joint dysfunction  The above assessment  and management plan was discussed with the patient. The patient verbalized understanding of and has agreed to the management plan. Patient is aware to call the clinic if symptoms persist or worsen. Patient is aware when to return to the clinic for a follow-up visit. Patient educated on when it is appropriate to go to the emergency department.   Monia Pouch, FNP-C [facility] [phone]

## 2017-10-11 NOTE — Patient Instructions (Signed)
Sacroiliac Joint Dysfunction Sacroiliac joint dysfunction is a condition that causes inflammation on one or both sides of the sacroiliac (SI) joint. The SI joint connects the lower part of the spine (sacrum) with the two upper portions of the pelvis (ilium). This condition causes deep aching or burning pain in the low back. In some cases, the pain may also spread into one or both buttocks or hips or spread down the legs. What are the causes? This condition may be caused by:  Pregnancy. During pregnancy, extra stress is put on the SI joints because the pelvis widens.  Injury, such as: ? Car accidents. ? Sport-related injuries. ? Work-related injuries.  Having one leg that is shorter than the other.  Conditions that affect the joints, such as: ? Rheumatoid arthritis. ? Gout. ? Psoriatic arthritis. ? Joint infection (septic arthritis).  Sometimes, the cause of SI joint dysfunction is not known. What are the signs or symptoms? Symptoms of this condition include:  Aching or burning pain in the lower back. The pain may also spread to other areas, such as: ? Buttocks. ? Groin. ? Thighs and legs.  Muscle spasms in or around the painful areas.  Increased pain when standing, walking, running, stair climbing, bending, or lifting.  How is this diagnosed? Your health care provider will do a physical exam and take your medical history. During the exam, the health care provider may move one or both of your legs to different positions to check for pain. Various tests may be done to help verify the diagnosis, including:  Imaging tests to look for other causes of pain. These may include: ? MRI. ? CT scan. ? Bone scan.  Diagnostic injection. A numbing medicine is injected into the SI joint using a needle. If the pain is temporarily improved or stopped after the injection, this can indicate that SI joint dysfunction is the problem.  How is this treated? Treatment may vary depending on the  cause and severity of your condition. Treatment options may include:  Applying ice or heat to the lower back area. This can help to reduce pain and muscle spasms.  Medicines to relieve pain or inflammation or to relax the muscles.  Wearing a back brace (sacroiliac brace) to help support the joint while your back is healing.  Physical therapy to increase muscle strength around the joint and flexibility at the joint. This may also involve learning proper body positions and ways of moving to relieve stress on the joint.  Direct manipulation of the SI joint.  Injections of steroid medicine into the joint in order to reduce pain and swelling.  Radiofrequency ablation to burn away nerves that are carrying pain messages from the joint.  Use of a device that provides electrical stimulation in order to reduce pain at the joint.  Surgery to put in screws and plates that limit or prevent joint motion. This is rare.  Follow these instructions at home:  Rest as needed. Limit your activities as directed by your health care provider.  Take medicines only as directed by your health care provider.  If directed, apply ice to the affected area: ? Put ice in a plastic bag. ? Place a towel between your skin and the bag. ? Leave the ice on for 20 minutes, 2-3 times per day.  Use a heating pad or a moist heat pack as directed by your health care provider.  Exercise as directed by your health care provider or physical therapist.  Keep all follow-up visits   as directed by your health care provider. This is important. Contact a health care provider if:  Your pain is not controlled with medicine.  You have a fever.  You have increasingly severe pain. Get help right away if:  You have weakness, numbness, or tingling in your legs or feet.  You lose control of your bladder or bowel. This information is not intended to replace advice given to you by your health care provider. Make sure you discuss  any questions you have with your health care provider. Document Released: 03/19/2008 Document Revised: 05/29/2015 Document Reviewed: 08/28/2013 Elsevier Interactive Patient Education  2018 [employer]  

## 2017-10-12 ENCOUNTER — Ambulatory Visit (INDEPENDENT_AMBULATORY_CARE_PROVIDER_SITE_OTHER): Payer: Medicare Other | Admitting: Pharmacist Clinician (PhC)/ Clinical Pharmacy Specialist

## 2017-10-12 DIAGNOSIS — I480 Paroxysmal atrial fibrillation: Secondary | ICD-10-CM

## 2017-10-12 DIAGNOSIS — Z7901 Long term (current) use of anticoagulants: Secondary | ICD-10-CM

## 2017-10-12 LAB — HEMOGLOBIN, FINGERSTICK: Hemoglobin: 11.8 g/dL (ref 11.1–15.9)

## 2017-10-12 LAB — COAGUCHEK XS/INR WAIVED
INR: 4.1 — ABNORMAL HIGH (ref 0.9–1.1)
PROTHROMBIN TIME: 49.6 s

## 2017-10-12 NOTE — Patient Instructions (Signed)
Description   No warfarin today and tomorrow, then take 1/2 tablet a day  Today's INR is 4.1  (Goal is 2-3)  Level is little thin today

## 2017-10-17 ENCOUNTER — Ambulatory Visit (INDEPENDENT_AMBULATORY_CARE_PROVIDER_SITE_OTHER): Payer: Medicare Other | Admitting: Family

## 2017-10-17 ENCOUNTER — Other Ambulatory Visit: Payer: Self-pay | Admitting: Physician Assistant

## 2017-10-17 ENCOUNTER — Encounter: Payer: Self-pay | Admitting: Family

## 2017-10-17 VITALS — BP 112/79 | HR 122 | Temp 97.1°F | Ht 65.0 in | Wt 137.8 lb

## 2017-10-17 DIAGNOSIS — Z7901 Long term (current) use of anticoagulants: Secondary | ICD-10-CM | POA: Diagnosis not present

## 2017-10-17 DIAGNOSIS — I480 Paroxysmal atrial fibrillation: Secondary | ICD-10-CM

## 2017-10-17 LAB — COAGUCHEK XS/INR WAIVED
INR: 1.5 — ABNORMAL HIGH (ref 0.9–1.1)
Prothrombin Time: 18.1 s

## 2017-10-17 NOTE — Patient Instructions (Signed)
Atrial Fibrillation Atrial fibrillation is a type of irregular or rapid heartbeat (arrhythmia). In atrial fibrillation, the heart quivers continuously in a chaotic pattern. This occurs when parts of the heart receive disorganized signals that make the heart unable to pump blood normally. This can increase the risk for stroke, heart failure, and other heart-related conditions. There are different types of atrial fibrillation, including:  Paroxysmal atrial fibrillation. This type starts suddenly, and it usually stops on its own shortly after it starts.  Persistent atrial fibrillation. This type often lasts longer than a week. It may stop on its own or with treatment.  Long-lasting persistent atrial fibrillation. This type lasts longer than 12 months.  Permanent atrial fibrillation. This type does not go away.  Talk with your health care provider to learn about the type of atrial fibrillation that you have. What are the causes? This condition is caused by some heart-related conditions or procedures, including:  A heart attack.  Coronary artery disease.  Heart failure.  Heart valve conditions.  High blood pressure.  Inflammation of the sac that surrounds the heart (pericarditis).  Heart surgery.  Certain heart rhythm disorders, such as Wolf-Parkinson-White syndrome.  Other causes include:  Pneumonia.  Obstructive sleep apnea.  Blockage of an artery in the lungs (pulmonary embolism, or PE).  Lung cancer.  Chronic lung disease.  Thyroid problems, especially if the thyroid is overactive (hyperthyroidism).  Caffeine.  Excessive alcohol use or illegal drug use.  Use of some medicines, including certain decongestants and diet pills.  Sometimes, the cause cannot be found. What increases the risk? This condition is more likely to develop in:  People who are older in age.  People who smoke.  People who have diabetes mellitus.  People who are overweight  (obese).  Athletes who exercise vigorously.  What are the signs or symptoms? Symptoms of this condition include:  A feeling that your heart is beating rapidly or irregularly.  A feeling of discomfort or pain in your chest.  Shortness of breath.  Sudden light-headedness or weakness.  Getting tired easily during exercise.  In some cases, there are no symptoms. How is this diagnosed? Your health care provider may be able to detect atrial fibrillation when taking your pulse. If detected, this condition may be diagnosed with:  An electrocardiogram (ECG).  A Holter monitor test that records your heartbeat patterns over a 24-hour period.  Transthoracic echocardiogram (TTE) to evaluate how blood flows through your heart.  Transesophageal echocardiogram (TEE) to view more detailed images of your heart.  A stress test.  Imaging tests, such as a CT scan or chest X-ray.  Blood tests.  How is this treated? The main goals of treatment are to prevent blood clots from forming and to keep your heart beating at a normal rate and rhythm. The type of treatment that you receive depends on many factors, such as your underlying medical conditions and how you feel when you are experiencing atrial fibrillation. This condition may be treated with:  Medicine to slow down the heart rate, bring the heart's rhythm back to normal, or prevent clots from forming.  Electrical cardioversion. This is a procedure that resets your heart's rhythm by delivering a controlled, low-energy shock to the heart through your skin.  Different types of ablation, such as catheter ablation, catheter ablation with pacemaker, or surgical ablation. These procedures destroy the heart tissues that send abnormal signals. When the pacemaker is used, it is placed under your skin to help your heart beat in   a regular rhythm.  Follow these instructions at home:  Take over-the counter and prescription medicines only as told by your  health care provider.  If your health care provider prescribed a blood-thinning medicine (anticoagulant), take it exactly as told. Taking too much blood-thinning medicine can cause bleeding. If you do not take enough blood-thinning medicine, you will not have the protection that you need against stroke and other problems.  Do not use tobacco products, including cigarettes, chewing tobacco, and e-cigarettes. If you need help quitting, ask your health care provider.  If you have obstructive sleep apnea, manage your condition as told by your health care provider.  Do not drink alcohol.  Do not drink beverages that contain caffeine, such as coffee, soda, and tea.  Maintain a healthy weight. Do not use diet pills unless your health care provider approves. Diet pills may make heart problems worse.  Follow diet instructions as told by your health care provider.  Exercise regularly as told by your health care provider.  Keep all follow-up visits as told by your health care provider. This is important. How is this prevented?  Avoid drinking beverages that contain caffeine or alcohol.  Avoid certain medicines, especially medicines that are used for breathing problems.  Avoid certain herbs and herbal medicines, such as those that contain ephedra or ginseng.  Do not use illegal drugs, such as cocaine and amphetamines.  Do not smoke.  Manage your high blood pressure. Contact a health care provider if:  You notice a change in the rate, rhythm, or strength of your heartbeat.  You are taking an anticoagulant and you notice increased bruising.  You tire more easily when you exercise or exert yourself. Get help right away if:  You have chest pain, abdominal pain, sweating, or weakness.  You feel nauseous.  You notice blood in your vomit, bowel movement, or urine.  You have shortness of breath.  You suddenly have swollen feet and ankles.  You feel dizzy.  You have sudden weakness or  numbness of the face, arm, or leg, especially on one side of the body.  You have trouble speaking, trouble understanding, or both (aphasia).  Your face or your eyelid droops on one side. These symptoms may represent a serious problem that is an emergency. Do not wait to see if the symptoms will go away. Get medical help right away. Call your local emergency services (911 in the [place] Do not drive yourself to the hospital. This information is not intended to replace advice given to you by your health care provider. Make sure you discuss any questions you have with your health care provider. Document Released: 12/21/2004 Document Revised: 04/30/2015 Document Reviewed: 04/17/2014 Elsevier Interactive Patient Education  2018 Elsevier Inc.  

## 2017-10-17 NOTE — Progress Notes (Signed)
   Subjective:    Patient ID: [name], female    DOB: [birth year], 84 y.o.   MRN: [id number]  No chief complaint on file.  PT presents to the office today for INR check. She states her husband passed away 2017/10/20. She states she "wore out" with the funeral and having so much company. She estates she has had family staying with her since.   Her INR on 2017/10/20 was 4.1. She was told to hold her warfarin for two days and then restart 2.5 mg daily.    Marland KitchenHPI    Review of Systems     Objective:   Physical Exam  Constitutional: She is oriented to person, place, and time. She appears well-developed and well-nourished. No distress.  HENT:  Head: Normocephalic.  Eyes: Pupils are equal, round, and reactive to light.  Neck: Normal range of motion. Neck supple. No thyromegaly present.  Cardiovascular: Normal heart sounds and intact distal pulses. An irregular rhythm present. Tachycardia present.  No murmur heard. Pulmonary/Chest: Effort normal and breath sounds normal. No respiratory distress. She has no wheezes.  Abdominal: Soft. Bowel sounds are normal. She exhibits no distension. There is no tenderness.  Musculoskeletal: Normal range of motion. She exhibits no edema or tenderness.  Neurological: She is alert and oriented to person, place, and time. She has normal reflexes. No cranial nerve deficit.  Skin: Skin is warm and dry.  Psychiatric: She has a normal mood and affect. Her behavior is normal. Judgment and thought content normal.  Vitals reviewed.     BP 112/79   Pulse (!) 122   Temp (!) 97.1 F (36.2 C) (Oral)   Ht 5\' 5"  (1.651 m)   Wt 137 lb 12.8 oz (62.5 kg)   BMI 22.93 kg/m      Assessment & Plan:  [name] comes in today with chief complaint of No chief complaint on file.   Diagnosis and orders addressed:  1. Paroxysmal atrial fibrillation (HCC) Pt will call cardiologists  2. Warfarin anticoagulation Description   Today's INR is 1.5  (Goal is 2-3)   Level is little thick today.   Take 5 mg ( 1 tablet today) and we will continue dose of  taking 2.5 mg (1/2 tablet) daily.       Follow up plan: 1 week to recheck INR   [name], FNP

## 2017-10-17 NOTE — Addendum Note (Signed)
Addended by: Shelbie Ammons on: 10/17/2017 02:20 PM   Modules accepted: Orders

## 2017-10-17 NOTE — Addendum Note (Signed)
Addended by: Clarene Critchley on: 10/17/2017 04:10 PM   Modules accepted: Orders

## 2017-10-24 ENCOUNTER — Encounter: Payer: Self-pay | Admitting: Family

## 2017-10-24 ENCOUNTER — Ambulatory Visit (INDEPENDENT_AMBULATORY_CARE_PROVIDER_SITE_OTHER): Payer: Medicare Other | Admitting: Family

## 2017-10-24 VITALS — BP 149/104 | HR 128 | Temp 97.1°F | Ht 65.0 in | Wt 138.8 lb

## 2017-10-24 DIAGNOSIS — G4700 Insomnia, unspecified: Secondary | ICD-10-CM

## 2017-10-24 DIAGNOSIS — Z7901 Long term (current) use of anticoagulants: Secondary | ICD-10-CM | POA: Diagnosis not present

## 2017-10-24 DIAGNOSIS — I480 Paroxysmal atrial fibrillation: Secondary | ICD-10-CM

## 2017-10-24 LAB — COAGUCHEK XS/INR WAIVED
INR: 1.7 — ABNORMAL HIGH (ref 0.9–1.1)
PROTHROMBIN TIME: 20.3 s

## 2017-10-24 MED ORDER — METOPROLOL SUCCINATE ER 25 MG PO TB24: ORAL_TABLET | ORAL | 3 refills | Status: DC

## 2017-10-24 NOTE — Patient Instructions (Signed)
Atrial Fibrillation Atrial fibrillation is a type of irregular or rapid heartbeat (arrhythmia). In atrial fibrillation, the heart quivers continuously in a chaotic pattern. This occurs when parts of the heart receive disorganized signals that make the heart unable to pump blood normally. This can increase the risk for stroke, heart failure, and other heart-related conditions. There are different types of atrial fibrillation, including:  Paroxysmal atrial fibrillation. This type starts suddenly, and it usually stops on its own shortly after it starts.  Persistent atrial fibrillation. This type often lasts longer than a week. It may stop on its own or with treatment.  Long-lasting persistent atrial fibrillation. This type lasts longer than 12 months.  Permanent atrial fibrillation. This type does not go away.  Talk with your health care provider to learn about the type of atrial fibrillation that you have. What are the causes? This condition is caused by some heart-related conditions or procedures, including:  A heart attack.  Coronary artery disease.  Heart failure.  Heart valve conditions.  High blood pressure.  Inflammation of the sac that surrounds the heart (pericarditis).  Heart surgery.  Certain heart rhythm disorders, such as Wolf-Parkinson-White syndrome.  Other causes include:  Pneumonia.  Obstructive sleep apnea.  Blockage of an artery in the lungs (pulmonary embolism, or PE).  Lung cancer.  Chronic lung disease.  Thyroid problems, especially if the thyroid is overactive (hyperthyroidism).  Caffeine.  Excessive alcohol use or illegal drug use.  Use of some medicines, including certain decongestants and diet pills.  Sometimes, the cause cannot be found. What increases the risk? This condition is more likely to develop in:  People who are older in age.  People who smoke.  People who have diabetes mellitus.  People who are overweight  (obese).  Athletes who exercise vigorously.  What are the signs or symptoms? Symptoms of this condition include:  A feeling that your heart is beating rapidly or irregularly.  A feeling of discomfort or pain in your chest.  Shortness of breath.  Sudden light-headedness or weakness.  Getting tired easily during exercise.  In some cases, there are no symptoms. How is this diagnosed? Your health care provider may be able to detect atrial fibrillation when taking your pulse. If detected, this condition may be diagnosed with:  An electrocardiogram (ECG).  A Holter monitor test that records your heartbeat patterns over a 24-hour period.  Transthoracic echocardiogram (TTE) to evaluate how blood flows through your heart.  Transesophageal echocardiogram (TEE) to view more detailed images of your heart.  A stress test.  Imaging tests, such as a CT scan or chest X-ray.  Blood tests.  How is this treated? The main goals of treatment are to prevent blood clots from forming and to keep your heart beating at a normal rate and rhythm. The type of treatment that you receive depends on many factors, such as your underlying medical conditions and how you feel when you are experiencing atrial fibrillation. This condition may be treated with:  Medicine to slow down the heart rate, bring the heart's rhythm back to normal, or prevent clots from forming.  Electrical cardioversion. This is a procedure that resets your heart's rhythm by delivering a controlled, low-energy shock to the heart through your skin.  Different types of ablation, such as catheter ablation, catheter ablation with pacemaker, or surgical ablation. These procedures destroy the heart tissues that send abnormal signals. When the pacemaker is used, it is placed under your skin to help your heart beat in   a regular rhythm.  Follow these instructions at home:  Take over-the counter and prescription medicines only as told by your  health care provider.  If your health care provider prescribed a blood-thinning medicine (anticoagulant), take it exactly as told. Taking too much blood-thinning medicine can cause bleeding. If you do not take enough blood-thinning medicine, you will not have the protection that you need against stroke and other problems.  Do not use tobacco products, including cigarettes, chewing tobacco, and e-cigarettes. If you need help quitting, ask your health care provider.  If you have obstructive sleep apnea, manage your condition as told by your health care provider.  Do not drink alcohol.  Do not drink beverages that contain caffeine, such as coffee, soda, and tea.  Maintain a healthy weight. Do not use diet pills unless your health care provider approves. Diet pills may make heart problems worse.  Follow diet instructions as told by your health care provider.  Exercise regularly as told by your health care provider.  Keep all follow-up visits as told by your health care provider. This is important. How is this prevented?  Avoid drinking beverages that contain caffeine or alcohol.  Avoid certain medicines, especially medicines that are used for breathing problems.  Avoid certain herbs and herbal medicines, such as those that contain ephedra or ginseng.  Do not use illegal drugs, such as cocaine and amphetamines.  Do not smoke.  Manage your high blood pressure. Contact a health care provider if:  You notice a change in the rate, rhythm, or strength of your heartbeat.  You are taking an anticoagulant and you notice increased bruising.  You tire more easily when you exercise or exert yourself. Get help right away if:  You have chest pain, abdominal pain, sweating, or weakness.  You feel nauseous.  You notice blood in your vomit, bowel movement, or urine.  You have shortness of breath.  You suddenly have swollen feet and ankles.  You feel dizzy.  You have sudden weakness or  numbness of the face, arm, or leg, especially on one side of the body.  You have trouble speaking, trouble understanding, or both (aphasia).  Your face or your eyelid droops on one side. These symptoms may represent a serious problem that is an emergency. Do not wait to see if the symptoms will go away. Get medical help right away. Call your local emergency services (911 in the [place] Do not drive yourself to the hospital. This information is not intended to replace advice given to you by your health care provider. Make sure you discuss any questions you have with your health care provider. Document Released: 12/21/2004 Document Revised: 04/30/2015 Document Reviewed: 04/17/2014 Elsevier Interactive Patient Education  2018 Elsevier Inc.  

## 2017-10-24 NOTE — Progress Notes (Signed)
   Subjective:    Patient ID: [name], female    DOB: [birth year], 84 y.o.   MRN: [id number]  Chief Complaint  Patient presents with  . Coagulation Disorder    HPI Pt presents to the office today for INR check. She has A Fib and is currently taking warfarin. Her INR today was 1.7. She states she believes she missed her last Cardiologists appointment. See anticoagulation flow sheet.   She is complaining of left hip that comes and goes. She reports a intermittent aching pain of 8 out 10. She has taken tylenol with mild relief.   She states she has not been able to sleep since her husband died about two weeks ago. She states her sister gave her an xanax and it helped her sleep. Feels sad, tired, and weak.    Review of Systems  Musculoskeletal: Positive for arthralgias.  All other systems reviewed and are negative.      Objective:   Physical Exam  Constitutional: She is oriented to person, place, and time. She appears well-developed and well-nourished. No distress.  HENT:  Head: Normocephalic and atraumatic.  Right Ear: External ear normal.  Mouth/Throat: Oropharynx is clear and moist.  Eyes: Pupils are equal, round, and reactive to light.  Neck: Normal range of motion. Neck supple. No thyromegaly present.  Cardiovascular: Normal rate, normal heart sounds and intact distal pulses. An irregularly irregular rhythm present.  No murmur heard. Pulmonary/Chest: Effort normal and breath sounds normal. No respiratory distress. She has no wheezes.  Abdominal: Soft. Bowel sounds are normal. She exhibits no distension. There is no tenderness.  Musculoskeletal: Normal range of motion. She exhibits no edema or tenderness.  Neurological: She is alert and oriented to person, place, and time. She has normal reflexes. No cranial nerve deficit.  Skin: Skin is warm and dry.  Psychiatric: She has a normal mood and affect. Her behavior is normal. Judgment and thought content normal.  Vitals  reviewed.    BP (!) 149/104   Pulse (!) 128   Temp (!) 97.1 F (36.2 C) (Oral)   Ht 5\' 5"  (1.651 m)   Wt 138 lb 12.8 oz (63 kg)   BMI 23.10 kg/m      Assessment & Plan:  [name] comes in today with chief complaint of Coagulation Disorder   Diagnosis and orders addressed:  1. Paroxysmal atrial fibrillation (HCC) We add metoprolol 25 mg daily as needed Avoid caffeine  Follow up with Cardiologists - CoaguChek XS/INR Waived - EKG 12-Lead - metoprolol succinate (TOPROL-XL) 25 MG 24 hr tablet; Take once daily as needed for A Fib and palpitations  Dispense: 90 tablet; Refill: 3  2. Warfarin anticoagulation Description   Today's INR is 1.7  (Goal is 2-3)  Level is little thick today.   Take 5 mg ( 1 tablet today) and we will increase Warfarin 5 mg (1 tablet) on Monday and Friday and  Take 2.5 mg (1/2 tab) all other days.      - CoaguChek XS/INR Waived  3. Insomnia, unspecified type Follow up with PCP  I called and Spoke with provider at the [facility]. They suggested adding the extra metoprolol and having her follow up with her Cardiologists or their clinic.  Follow in 2 weeks with PCP for INR and Insomnia     Terri Dun, FNP

## 2017-10-25 ENCOUNTER — Ambulatory Visit (HOSPITAL_COMMUNITY): Payer: Medicare Other | Admitting: Nurse Practitioner

## 2017-10-25 ENCOUNTER — Telehealth (HOSPITAL_COMMUNITY): Payer: Self-pay | Admitting: *Deleted

## 2017-10-25 NOTE — Telephone Encounter (Signed)
PT states she will call her Cardiologists and make follow up appt

## 2017-10-25 NOTE — Telephone Encounter (Signed)
Pt cld this morning to ask if she has an appt this week at clinic.  I explained to the patient that the appt was 8:30 this morning. I offered to reschedule and asked if patient felt better.  She stated she did feel better but was unable to take her HR at this time.  She did not want to reschedule at this time.  Please follow up with pt on HRs and if she will need our services in the future. Will route to [name], NP

## 2017-11-01 ENCOUNTER — Ambulatory Visit: Payer: Medicare Other | Attending: Family Medicine | Admitting: Physical Therapy

## 2017-11-01 ENCOUNTER — Other Ambulatory Visit: Payer: Self-pay

## 2017-11-01 DIAGNOSIS — G8929 Other chronic pain: Secondary | ICD-10-CM | POA: Diagnosis not present

## 2017-11-01 DIAGNOSIS — R293 Abnormal posture: Secondary | ICD-10-CM | POA: Diagnosis not present

## 2017-11-01 DIAGNOSIS — M545 Low back pain: Secondary | ICD-10-CM | POA: Diagnosis not present

## 2017-11-01 NOTE — Therapy (Signed)
[facility] [place], [place], [address] Phone: [phone]   Fax:  [phone]  Physical Therapy Evaluation  Patient Details  Name: [name] MRN: [id number] Date of Birth: [birth year] Referring Provider (PT): Darla Lesches FNP.   Encounter Date: 11/01/2017  PT End of Session - 11/01/17 1407    Visit Number  1    Number of Visits  6    Date for PT Re-Evaluation  12/13/17    PT Start Time  1243    PT Stop Time  1340    PT Time Calculation (min)  57 min    Activity Tolerance  Patient tolerated treatment well    Behavior During Therapy  [facility] for tasks assessed/performed       Past Medical History:  Diagnosis Date  . Anemia   . Atrial fibrillation (HCC)    Paroxysmal, Flecainide therapy  . Calf pain    September, 2012, at rest  . Carotid bruit    Doppler, December, 2009, no abnormality  . Cataract   . Diverticulosis   . GERD (gastroesophageal reflux disease)   . History of shingles   . HLD (hyperlipidemia)   . HTN (hypertension)   . Hypothyroidism   . Insomnia   . Osteoarthritis   . PONV (postoperative nausea and vomiting)   . Rectal bleeding 2001   diverticulosis and int hemorrhoids on 07/1999 and 02/2010 colonoscopies.  . Stroke ([place])   . Warfarin anticoagulation     Past Surgical History:  Procedure Laterality Date  . BIOPSY  09/29/2015   Procedure: BIOPSY;  Surgeon: Rogene Houston, MD;  [facility]: [facility];  Service: Endoscopy;;  gastric  . CATARACT EXTRACTION W/PHACO Right 04/15/2014   Procedure: CATARACT EXTRACTION PHACO AND INTRAOCULAR LENS PLACEMENT (IOC);  Surgeon: Tonny Branch, MD;  Location: [facility];  Service: Ophthalmology;  Laterality: Right;  CDE: 13.30  . CATARACT EXTRACTION W/PHACO Left 05/13/2014   Procedure: CATARACT EXTRACTION PHACO AND INTRAOCULAR LENS PLACEMENT LEFT EYE;  Surgeon: Tonny Branch, MD;  Location: [facility];  Service: Ophthalmology;  Laterality: Left;  CDE:13.00  . CHOLECYSTECTOMY  N/A 01/15/2015   Procedure: LAPAROSCOPIC CHOLECYSTECTOMY;  Surgeon: Ralene Ok, MD;  Location: [facility];  Service: General;  Laterality: N/A;  . COLONOSCOPY N/A 09/29/2015   Procedure: COLONOSCOPY;  Surgeon: Rogene Houston, MD;  [facility];  Service: Endoscopy;  Laterality: N/A;  . COLONOSCOPY    . ESOPHAGOGASTRODUODENOSCOPY N/A 01/13/2015   Procedure: ESOPHAGOGASTRODUODENOSCOPY (EGD);  Surgeon: Jerene Bears, MD;  Location: [facility];  Service: Endoscopy;  Laterality: N/A;  . ESOPHAGOGASTRODUODENOSCOPY N/A 09/29/2015   Procedure: ESOPHAGOGASTRODUODENOSCOPY (EGD);  Surgeon: Rogene Houston, MD;  [facility];  Service: Endoscopy;  Laterality: N/A;  12:15  . GALLBLADDER SURGERY    . LEFT HEART CATH AND CORONARY ANGIOGRAPHY N/A 06/22/2016   Procedure: Left Heart Cath and Coronary Angiography;  Surgeon: Troy Sine, MD;  Location: [facility];  Service: Cardiovascular;  Laterality: N/A;  . SHOULDER OPEN ROTATOR CUFF REPAIR     bilateral  . TOTAL KNEE ARTHROPLASTY  05/26/10   right  . TOTAL KNEE ARTHROPLASTY  11/17/2010   Procedure: TOTAL KNEE ARTHROPLASTY;  Surgeon: Mauri Pole;  Location: [facility];  Service: Orthopedics;  Laterality: Left;  . TOTAL KNEE ARTHROPLASTY     Right  . TOTAL SHOULDER REPLACEMENT  01/2010   left    There were no vitals filed for this visit.   Subjective  Assessment - 11/01/17 1312    Subjective  The patient presents to the clinic today with c/o left sided low back pain with radiation of pain into her anterior thigh and at times posteriorly as well.  her pain-level is a high 8/10 today.  She is with her neighbor today  who helps her.  Patient would like to limits visits and do home exercises.  She also has exercise equipment at home.  The more she is up the more she hurts.  When she is off her feet tha pain subsides.    Patient is accompained by:  --   Friend.   Pertinent History  Atrial fib; OA; HTN; CVA.    Limitations  Standing     How long can you stand comfortably?  10 minutes.    How long can you walk comfortably?  Short distances with a staff.    Diagnostic tests  MRI; X-ray.    Patient Stated Goals  Reduce pain and increase activity.    Currently in Pain?  Yes    Pain Score  8     Pain Location  Back    Pain Orientation  Left    Pain Descriptors / Indicators  Aching;Sharp    Pain Type  Chronic pain    Pain Onset  More than a month ago    Pain Frequency  Constant    Aggravating Factors   See above.    Pain Relieving Factors  See above.         [facility] PT Assessment - 11/01/17 0001      Assessment   Medical Diagnosis  Sacroiliac joint dysfucntion of left side.    Referring Provider (PT)  Darla Lesches FNP.    Onset Date/Surgical Date  --   5-6 years.     Precautions   Precautions  Fall      Restrictions   Weight Bearing Restrictions  No      Balance Screen   Has the patient fallen in the past 6 months  No    Has the patient had a decrease in activity level because of a fear of falling?   Yes    Is the patient reluctant to leave their home because of a fear of falling?   No      Home Environment   Living Environment  Private residence      Prior Function   Level of Independence  Independent with household mobility with device      Posture/Postural Control   Posture/Postural Control  Postural limitations    Postural Limitations  Decreased lumbar lordosis;Increased thoracic kyphosis;Flexed trunk      ROM / Strength   AROM / PROM / Strength  AROM;Strength      AROM   Overall AROM Comments  Essentially full lumbar flexion and extension to 0 degrees.      Strength   Overall Strength Comments  Bilateral hip flexionand abduction= 3- to 3/5; bilateral knee strength and ankle strength= 4/5.      Palpation   Palpation comment  Very tender to palpation over left SIJ (CC today).      Special Tests   Other special tests  Bilateral LE DTR's= 1+/4+  (+) left SLR.  minimal pain with left FABER  test. Left LE longer than right due to left anterior pelvic rotation.  Legs equal after left SKTC stretch.      Ambulation/Gait   Gait Comments  Slow and cautious in trunk flexed posture with  a long staff.                Objective measurements completed on examination: See above findings.      [facility] Adult PT Treatment/Exercise - 11/01/17 0001      Modalities   Modalities  Electrical Stimulation;Moist Heat      Moist Heat Therapy   Number Minutes Moist Heat  20 Minutes    Moist Heat Location  Lumbar Spine      Electrical Stimulation   Electrical Stimulation Location  Left low back.    Electrical Stimulation Action  Pre-mod.    Electrical Stimulation Parameters  80-150 Hz x 20 minutes.    Electrical Stimulation Goals  Pain             PT Education - 11/01/17 1323    Education Details  HEP and information on TENs unit.    Person(s) Educated  Patient   Friend.   Methods  Explanation;Demonstration;Tactile cues;Verbal cues;Handout    Comprehension  Verbalized understanding;Returned demonstration;Verbal cues required       PT Short Term Goals - 11/01/17 1424      PT SHORT TERM GOAL #1   Title  STG's=LTG's.        PT Long Term Goals - 11/01/17 1424      PT LONG TERM GOAL #1   Title  Pt will be independent in HEP    Time  6    Period  Weeks    Status  New      PT LONG TERM GOAL #2   Title  Perform ADL's with pain not > 3-4/10.    Time  6    Period  Weeks    Status  New             Plan - 11/01/17 1409    Clinical Impression Statement  The patient presents to OPPT with c/o chronic left sided low back pain with radiation into her anterior thigh and on occasions into her posterior thigh. She has multiple postural abnormalites and LE weakness.  Her pain and deficits have impaired her functional mobility.  She demonstrates a positive left SLR and has a leg length discrepancy right > left corrected with a left SKTC stretch. Patient will benefit from  skilled physical therapy intervention to address deficits.  However, she is going to try a HEP prior to making a follow-up appointment.    History and Personal Factors relevant to plan of care:  Atrial fib; OA; HTN; CVA.    Clinical Presentation  Evolving    Clinical Decision Making  Moderate    Rehab Potential  Good    PT Frequency  1x / week    PT Duration  6 weeks    PT Treatment/Interventions  ADLs/Self Care Home Management;Cryotherapy;Electrical Stimulation;Ultrasound;Functional mobility training;Therapeutic activities;Therapeutic exercise;Patient/family education;Manual techniques;Passive range of motion    PT Next Visit Plan  S and DKTC; hip bridges; low-level ther ex; modalites and STW/M to left SIJ region.    Consulted and Agree with Plan of Care  Patient       Patient will benefit from skilled therapeutic intervention in order to improve the following deficits and impairments:  Abnormal gait, Pain, Decreased activity tolerance, Decreased strength, Decreased range of motion, Postural dysfunction  Visit Diagnosis: Chronic left-sided low back pain, unspecified whether sciatica present - Plan: PT plan of care cert/re-cert  Abnormal posture - Plan: PT plan of care cert/re-cert     Problem List Patient Active Problem List  Diagnosis Date Noted  . History of TIA (transient ischemic attack) 03/15/2017  . Osteoporosis 02/24/2017  . Prolonged QT interval 01/28/2017  . Abnormal nuclear cardiac imaging test   . Unstable angina ([place]) 06/16/2016  . Atrial flutter ([place]) 06/16/2016  . Frequent urination 05/10/2016  . Hypomagnesemia 04/23/2016  . Melena 09/23/2015  . Overactive bladder 05/02/2015  . History of CVA (cerebrovascular accident) 04/21/2015  . History of dysarthria 02/20/2015  . HTN (hypertension) 01/11/2015  . Hyponatremia 01/11/2015  . Rectal bleeding 01/11/2015  . Chronic pain syndrome 11/25/2014  . Insomnia 11/25/2014  . Chronic diastolic CHF (congestive heart  failure) ([place]) 11/12/2014  . Paroxysmal atrial fibrillation ([place]) 10/14/2014  . Warfarin anticoagulation   . Hypothyroidism   . Carotid bruit   . S/P knee replacement   . UNSPECIFIED ANEMIA 01/19/2010  . DIVERTICULOSIS-COLON 01/19/2010    Aneesh Faller, [place] MPT 11/01/2017, 2:34 PM  [facility] [address] [place], [place], [address] Phone: [phone]   Fax:  [phone]  Name: [name] MRN: [id number] Date of Birth: Nov 14, 1932

## 2017-11-03 ENCOUNTER — Ambulatory Visit (INDEPENDENT_AMBULATORY_CARE_PROVIDER_SITE_OTHER): Payer: Medicare Other | Admitting: Internal Medicine

## 2017-11-09 ENCOUNTER — Encounter: Payer: Self-pay | Admitting: Family Medicine

## 2017-11-09 ENCOUNTER — Ambulatory Visit (INDEPENDENT_AMBULATORY_CARE_PROVIDER_SITE_OTHER): Payer: Medicare Other | Admitting: Family Medicine

## 2017-11-09 VITALS — BP 130/89 | HR 129 | Temp 96.5°F | Ht 65.0 in | Wt 137.4 lb

## 2017-11-09 DIAGNOSIS — I480 Paroxysmal atrial fibrillation: Secondary | ICD-10-CM

## 2017-11-09 DIAGNOSIS — I10 Essential (primary) hypertension: Secondary | ICD-10-CM | POA: Diagnosis not present

## 2017-11-09 DIAGNOSIS — Z7901 Long term (current) use of anticoagulants: Secondary | ICD-10-CM

## 2017-11-09 DIAGNOSIS — E039 Hypothyroidism, unspecified: Secondary | ICD-10-CM | POA: Diagnosis not present

## 2017-11-09 LAB — COAGUCHEK XS/INR WAIVED
INR: 2 — ABNORMAL HIGH (ref 0.9–1.1)
PROTHROMBIN TIME: 23.9 s

## 2017-11-09 MED ORDER — METOPROLOL SUCCINATE ER 100 MG PO TB24: 100.0000 mg | ORAL_TABLET | Freq: Every day | ORAL | 3 refills | Status: DC

## 2017-11-09 MED ORDER — ATORVASTATIN CALCIUM 20 MG PO TABS: 20.0000 mg | ORAL_TABLET | Freq: Every day | ORAL | 3 refills | Status: DC

## 2017-11-09 MED ORDER — LEVOTHYROXINE SODIUM 50 MCG PO TABS: ORAL_TABLET | ORAL | 3 refills | Status: DC

## 2017-11-09 MED ORDER — LEVETIRACETAM 250 MG PO TABS: 250.0000 mg | ORAL_TABLET | Freq: Two times a day (BID) | ORAL | 3 refills | Status: DC

## 2017-11-09 MED ORDER — MIRTAZAPINE 15 MG PO TABS: 15.0000 mg | ORAL_TABLET | Freq: Every day | ORAL | 5 refills | Status: DC

## 2017-11-09 MED ORDER — PANTOPRAZOLE SODIUM 40 MG PO TBEC: 40.0000 mg | DELAYED_RELEASE_TABLET | Freq: Every day | ORAL | 3 refills | Status: DC

## 2017-11-09 NOTE — Patient Instructions (Signed)
Recommended for patient to take a vitamin B complex multivitamin or some kind of vitamin B multivitamin

## 2017-11-09 NOTE — Progress Notes (Signed)
BP 130/89   Pulse (!) 129   Temp (!) 96.5 F (35.8 C) (Oral)   Ht _0  (1.651 m)   Wt 137 lb 6.4 oz (62.3 kg)   BMI 22.86 kg/m    Subjective:    Patient ID: [name], female    DOB: Aug 11, 1932, 84 y.o.   MRN: [id number]  HPI: [name] is a 84 y.o. female presenting on 11/09/2017 for Coagulation Disorder; Insomnia (2 month follow up); Diarrhea (x 1 month on and off); and Fatigue   HPI Hypertension Patient is currently on irbesartan and metoprolol, and their blood pressure today is 130/89. Patient denies any lightheadedness or dizziness. Patient denies headaches, blurred vision, chest pains, shortness of breath, or weakness. Denies any side effects from medication and is content with current medication.   Hypothyroidism recheck Patient is coming in for thyroid recheck today as well. They deny any issues with hair changes or heat or cold problems or diarrhea or constipation. They deny any chest pain or palpitations. They are currently on levothyroxine 48mcrograms   A. fib and anticoagulation recheck today, patient denies any signs of bleeding or bruising abnormally.  Patient is currently on Coumadin for stroke prevention  Relevant past medical, surgical, family and social history reviewed and updated as indicated. Interim medical history since our last visit reviewed. Allergies and medications reviewed and updated.  Review of Systems  Constitutional: Negative for chills and fever.  Eyes: Negative for redness and visual disturbance.  Respiratory: Negative for chest tightness and shortness of breath.   Cardiovascular: Negative for chest pain, palpitations and leg swelling.  Musculoskeletal: Negative for back pain and gait problem.  Skin: Negative for rash.  Neurological: Negative for light-headedness and headaches.  Psychiatric/Behavioral: Negative for agitation and behavioral problems.  All other systems reviewed and are negative.   Per HPI unless specifically  indicated above   Allergies as of 11/09/2017      Reactions   Penicillins Rash, Other (See Comments)   Has patient had a PCN reaction causing immediate rash, facial/tongue/throat swelling, SOB or lightheadedness with hypotension: No Has patient had a PCN reaction causing severe rash involving mucus membranes or skin necrosis: No Has patient had a PCN reaction that required hospitalization No Has patient had a PCN reaction occurring within the last 10 years: No If all of the above answers are "NO", then may proceed with Cephalosporin use.      Medication List        Accurate as of 11/09/17  1:58 PM. Always use your most recent med list.          ARTIFICIAL TEAR OP Apply 2 drops to eye daily as needed (Dryness). To the left eye   atorvastatin 20 MG tablet Commonly known as:  LIPITOR Take 1 tablet (20 mg total) by mouth at bedtime.   donepezil 5 MG tablet Commonly known as:  ARICEPT Take 1 tablet (5 mg total) by mouth at bedtime. FOR MEMORY   flecainide 50 MG tablet Commonly known as:  TAMBOCOR Take 0.5 tablets (25 mg total) by mouth 2 (two) times daily.   irbesartan 150 MG tablet Commonly known as:  AVAPRO TAKE 1 TABLET EVERY DAY (Needs to see PCP)   levETIRAcetam 250 MG tablet Commonly known as:  KEPPRA Take 1 tablet (250 mg total) by mouth 2 (two) times daily.   levothyroxine 50 MCG tablet Commonly known as:  SYNTHROID, LEVOTHROID Take 1 tablet daily before breakfast   metoprolol succinate 100  MG 24 hr tablet Commonly known as:  TOPROL-XL Take 1 tablet (100 mg total) by mouth daily. Take with or immediately following a meal.   mirtazapine 15 MG tablet Commonly known as:  REMERON Take 1 tablet (15 mg total) by mouth at bedtime.   pantoprazole 40 MG tablet Commonly known as:  PROTONIX Take 1 tablet (40 mg total) by mouth daily.   potassium chloride SA 20 MEQ tablet Commonly known as:  K-DUR,KLOR-CON Take 0.5 tablets (10 mEq total) by mouth daily.   traMADol  50 MG tablet Commonly known as:  ULTRAM Take 1 tablet (50 mg total) by mouth every 12 (twelve) hours as needed.   TYLENOL 325 MG Caps Generic drug:  Acetaminophen Take 650 mg by mouth every 6 (six) hours as needed for mild pain.   warfarin 5 MG tablet Commonly known as:  COUMADIN Take as directed by the anticoagulation clinic. If you are unsure how to take this medication, talk to your nurse or doctor. Original instructions:  Take 1 tablet (5 mg total) by mouth daily.          Objective:    BP 130/89   Pulse (!) 129   Temp (!) 96.5 F (35.8 C) (Oral)   Ht _0  (1.651 m)   Wt 137 lb 6.4 oz (62.3 kg)   BMI 22.86 kg/m   Wt Readings from Last 3 Encounters:  11/09/17 137 lb 6.4 oz (62.3 kg)  10/24/17 138 lb 12.8 oz (63 kg)  10/17/17 137 lb 12.8 oz (62.5 kg)    Physical Exam  Constitutional: She is oriented to person, place, and time. She appears well-developed and well-nourished. No distress.  Eyes: Conjunctivae are normal.  Cardiovascular: Normal rate and intact distal pulses. An irregular rhythm present.  No murmur heard. Pulmonary/Chest: Effort normal and breath sounds normal. No respiratory distress. She has no wheezes.  Musculoskeletal: Normal range of motion. She exhibits no edema.  Neurological: She is alert and oriented to person, place, and time. Coordination normal.  Skin: Skin is warm and dry. No rash noted. She is not diaphoretic.  Psychiatric: She has a normal mood and affect. Her behavior is normal.  Nursing note and vitals reviewed.   Results for orders placed or performed in visit on 11/09/17  CoaguChek XS/INR Waived  Result Value Ref Range   INR 2.0 (H) 0.9 - 1.1   Prothrombin Time 23.9 sec      Assessment & Plan:   Problem List Items Addressed This Visit      Cardiovascular and Mediastinum   Paroxysmal atrial fibrillation (HCC) - Primary   Relevant Medications   atorvastatin (LIPITOR) 20 MG tablet   metoprolol succinate (TOPROL-XL) 100 MG 24  hr tablet   Chronic diastolic CHF (congestive heart failure) (HCC)   Relevant Medications   atorvastatin (LIPITOR) 20 MG tablet   metoprolol succinate (TOPROL-XL) 100 MG 24 hr tablet   HTN (hypertension)   Relevant Medications   atorvastatin (LIPITOR) 20 MG tablet   metoprolol succinate (TOPROL-XL) 100 MG 24 hr tablet   Other Relevant Orders   CMP14+EGFR     Endocrine   Hypothyroidism   Relevant Medications   metoprolol succinate (TOPROL-XL) 100 MG 24 hr tablet   levothyroxine (SYNTHROID, LEVOTHROID) 50 MCG tablet   Other Relevant Orders   CBC with Differential/Platelet   TSH     Other   Insomnia    Other Visit Diagnoses    Chronic anticoagulation       Relevant  Orders   CoaguChek XS/INR Waived      We increased patient's metoprolol up to 100 mg daily because she was in A. fib with RVR in the 120s, asymptomatic.  Description   Today's INR is 2.0  (Goal is 2-3)  Continue to take Warfarin 5 mg (1 tablet) on Monday and Friday and  Take 2.5 mg (1/2 tab) all other days.      Recheck labs for hypothyroidism and hypertension today  Follow up plan: Return in about 2 weeks (around 11/23/2017), or if symptoms worsen or fail to improve, for Recheck A. fib.  Counseling provided for all of the vaccine components Orders Placed This Encounter  Procedures  . CoaguChek XS/INR Waived  . CMP14+EGFR  . CBC with Differential/Platelet  . TSH    Caryl Pina, MD [facility] 11/09/2017, 1:58 PM

## 2017-11-16 ENCOUNTER — Ambulatory Visit (INDEPENDENT_AMBULATORY_CARE_PROVIDER_SITE_OTHER): Payer: Medicare Other | Admitting: Family Medicine

## 2017-11-16 ENCOUNTER — Encounter: Payer: Self-pay | Admitting: Family Medicine

## 2017-11-16 VITALS — BP 135/96 | HR 116 | Temp 97.0°F | Ht 65.0 in | Wt 139.0 lb

## 2017-11-16 DIAGNOSIS — M5432 Sciatica, left side: Secondary | ICD-10-CM

## 2017-11-16 DIAGNOSIS — R350 Frequency of micturition: Secondary | ICD-10-CM

## 2017-11-16 MED ORDER — METHYLPREDNISOLONE ACETATE 40 MG/ML IJ SUSP
40.0000 mg | Freq: Once | INTRAMUSCULAR | Status: AC
  Administered 2017-11-16: 40 mg via INTRAMUSCULAR

## 2017-11-16 NOTE — Progress Notes (Signed)
Subjective: CC: leg pain PCP: Dettinger, Fransisca Kaufmann, MD [name] is a 84 y.o. female presenting to clinic today for:  1. Leg pain Patient reports acute onset of left lower extremity pain that starts in her buttock and radiates down the left leg.  She notes that she has had problems with sciatica in the past and this feels similar.  Symptoms seem to be getting somewhat worse than yesterday.  She has been applying heat to the affected area but this is not seem to be helping.  In fact, pain has been so severe that is been interfering with sleep.  She does have tramadol at home but does not think that she has been taking this, she did not know that this was for pain.  She also has Tylenol.  She is unable to take oral NSAIDs because she is chronically anticoagulated with Coumadin.  Denies any sensation changes.  She does have chronic low back pain.  No preceding injury.  She uses a cane for ambulation.  Denies any urinary retention, fecal incontinence or saddle anesthesia.   ROS: Per HPI  Allergies  Allergen Reactions  . Penicillins Rash and Other (See Comments)    Has patient had a PCN reaction causing immediate rash, facial/tongue/throat swelling, SOB or lightheadedness with hypotension: No Has patient had a PCN reaction causing severe rash involving mucus membranes or skin necrosis: No Has patient had a PCN reaction that required hospitalization No Has patient had a PCN reaction occurring within the last 10 years: No If all of the above answers are "NO", then may proceed with Cephalosporin use.   Past Medical History:  Diagnosis Date  . Anemia   . Atrial fibrillation (HCC)    Paroxysmal, Flecainide therapy  . Calf pain    September, 2012, at rest  . Carotid bruit    Doppler, December, 2009, no abnormality  . Cataract   . Diverticulosis   . GERD (gastroesophageal reflux disease)   . History of shingles   . HLD (hyperlipidemia)   . HTN (hypertension)   . Hypothyroidism   .  Insomnia   . Osteoarthritis   . PONV (postoperative nausea and vomiting)   . Rectal bleeding 2001   diverticulosis and int hemorrhoids on 07/1999 and 02/2010 colonoscopies.  . Stroke ([place])   . Warfarin anticoagulation     Current Outpatient Medications:  .  Acetaminophen (TYLENOL) 325 MG CAPS, Take 650 mg by mouth every 6 (six) hours as needed for mild pain., Disp: , Rfl:  .  ARTIFICIAL TEAR OP, Apply 2 drops to eye daily as needed (Dryness). To the left eye, Disp: , Rfl:  .  atorvastatin (LIPITOR) 20 MG tablet, Take 1 tablet (20 mg total) by mouth at bedtime., Disp: 90 tablet, Rfl: 3 .  donepezil (ARICEPT) 5 MG tablet, Take 1 tablet (5 mg total) by mouth at bedtime. FOR MEMORY, Disp: 30 tablet, Rfl: 5 .  flecainide (TAMBOCOR) 50 MG tablet, Take 0.5 tablets (25 mg total) by mouth 2 (two) times daily., Disp: 30 tablet, Rfl: 0 .  irbesartan (AVAPRO) 150 MG tablet, TAKE 1 TABLET EVERY DAY (Needs to see PCP), Disp: 30 tablet, Rfl: 4 .  levETIRAcetam (KEPPRA) 250 MG tablet, Take 1 tablet (250 mg total) by mouth 2 (two) times daily., Disp: 180 tablet, Rfl: 3 .  levothyroxine (SYNTHROID, LEVOTHROID) 50 MCG tablet, Take 1 tablet daily before breakfast, Disp: 90 tablet, Rfl: 3 .  metoprolol succinate (TOPROL-XL) 100 MG 24 hr tablet, Take  1 tablet (100 mg total) by mouth daily. Take with or immediately following a meal., Disp: 90 tablet, Rfl: 3 .  mirtazapine (REMERON) 15 MG tablet, Take 1 tablet (15 mg total) by mouth at bedtime., Disp: 30 tablet, Rfl: 5 .  pantoprazole (PROTONIX) 40 MG tablet, Take 1 tablet (40 mg total) by mouth daily., Disp: 90 tablet, Rfl: 3 .  potassium chloride SA (K-DUR,KLOR-CON) 20 MEQ tablet, Take 0.5 tablets (10 mEq total) by mouth daily., Disp: 10 tablet, Rfl: 0 .  traMADol (ULTRAM) 50 MG tablet, Take 1 tablet (50 mg total) by mouth every 12 (twelve) hours as needed., Disp: 14 tablet, Rfl: 0 .  warfarin (COUMADIN) 5 MG tablet, Take 1 tablet (5 mg total) by mouth daily., Disp:  10 tablet, Rfl: 0 Social History   Socioeconomic History  . Marital status: Married    Spouse name: Not on file  . Number of children: Not on file  . Years of education: Not on file  . Highest education level: Not on file  Occupational History  . Not on file  Social Needs  . Financial resource strain: Not on file  . Food insecurity:    Worry: Not on file    Inability: Not on file  . Transportation needs:    Medical: Not on file    Non-medical: Not on file  Tobacco Use  . Smoking status: Never Smoker  . Smokeless tobacco: Never Used  Substance and Sexual Activity  . Alcohol use: No  . Drug use: No  . Sexual activity: Never  Lifestyle  . Physical activity:    Days per week: Not on file    Minutes per session: Not on file  . Stress: Not on file  Relationships  . Social connections:    Talks on phone: Not on file    Gets together: Not on file    Attends religious service: Not on file    Active member of club or organization: Not on file    Attends meetings of clubs or organizations: Not on file    Relationship status: Not on file  . Intimate partner violence:    Fear of current or ex partner: Not on file    Emotionally abused: Not on file    Physically abused: Not on file    Forced sexual activity: Not on file  Other Topics Concern  . Not on file  Social History Narrative  . Not on file   Family History  Problem Relation Age of Onset  . COPD Brother   . Atrial fibrillation Brother   . Diabetes Brother   . Liver cancer Brother        Alcohol-related  . Colon cancer Brother   . Heart attack Father   . Early death Father   . Rheum arthritis Mother   . Arthritis Mother   . Heart disease Mother   . Coronary artery disease Brother   . Heart attack Daughter   . Gallbladder disease Brother     Objective: Office vital signs reviewed. BP (!) 135/96   Pulse (!) 116   Temp (!) 97 F (36.1 C)   Ht 5\' 5"  (1.651 m)   Wt 139 lb (63 kg)   BMI 23.13 kg/m    Physical Examination:  General: Awake, alert, No acute distress Extremities: warm, well perfused, No edema, cyanosis or clubbing; +2 pulses bilaterally MSK: slow gait and station; uses cane for ambulation.  Lumbar spine: Flattening of the thoracolumbar curvature.  She  has no midline tenderness to palpation.  She does have paraspinal tenderness palpation left greater than right particularly over the lumbosacral junction and sacroiliac junction.  She has pain over the left gluteus. Neuro: light touch sensation grossly in tact  Assessment/ Plan: 84 y.o. female   1. Sciatica of left side Clinically consistent with sciatica.  She is given a dose of Depo-Medrol 40 mg intramuscularly here in office.  We reviewed not using any NSAIDs given chronic anticoagulation.  I have advised her that she has tramadol at home if needed for pain.  She is to take this if needed.  I did caution sedation.  She is to follow-up with her primary care doctor in the next couple of weeks if symptoms are not improving or get worse.  We discussed reasons for emergent evaluation the emergency department as well.  2. Urinary frequency Patient noted this at the end of the visit and stated that this has been ongoing for the last 2 to 3 days.  UA was obtained.  We will treat with antibiotics pending results. - Urinalysis   Orders Placed This Encounter  Procedures  . Urinalysis   No orders of the defined types were placed in this encounter.    Janora Norlander, DO [place] [phone]

## 2017-11-16 NOTE — Patient Instructions (Signed)
You got a steroid injection today to help with pain and inflammation. You have tramadol at home that you can use for pain.  We discussed that you can use this up to twice daily if needed for pain.  We also discussed that it may cause you to be sleepy and can increase your risk of falls to use it very sparingly.  If you use heat, follow-up with ice to reduce inflammation.  If you develop any other worrisome symptoms or signs we discussed, please seek immediate medical attention.   Sciatica Sciatica is pain, numbness, weakness, or tingling along your sciatic nerve. The sciatic nerve starts in the lower back and goes down the back of each leg. Sciatica happens when this nerve is pinched or has pressure put on it. Sciatica usually goes away on its own or with treatment. Sometimes, sciatica may keep coming back (recur). Follow these instructions at home: Medicines  Take over-the-counter and prescription medicines only as told by your doctor.  Do not drive or use heavy machinery while taking prescription pain medicine. Managing pain  If directed, put ice on the affected area. ? Put ice in a plastic bag. ? Place a towel between your skin and the bag. ? Leave the ice on for 20 minutes, 2-3 times a day.  After icing, apply heat to the affected area before you exercise or as often as told by your doctor. Use the heat source that your doctor tells you to use, such as a moist heat pack or a heating pad. ? Place a towel between your skin and the heat source. ? Leave the heat on for 20-30 minutes. ? Remove the heat if your skin turns bright red. This is especially important if you are unable to feel pain, heat, or cold. You may have a greater risk of getting burned. Activity  Return to your normal activities as told by your doctor. Ask your doctor what activities are safe for you. ? Avoid activities that make your sciatica worse.  Take short rests during the day. Rest in a lying or standing  position. This is usually better than sitting to rest. ? When you rest for a long time, do some physical activity or stretching between periods of rest. ? Avoid sitting for a long time without moving. Get up and move around at least one time each hour.  Exercise and stretch regularly, as told by your doctor.  Do not lift anything that is heavier than 10 lb (4.5 kg) while you have symptoms of sciatica. ? Avoid lifting heavy things even when you do not have symptoms. ? Avoid lifting heavy things over and over.  When you lift objects, always lift in a way that is safe for your body. To do this, you should: ? Bend your knees. ? Keep the object close to your body. ? Avoid twisting. General instructions  Use good posture. ? Avoid leaning forward when you are sitting. ? Avoid hunching over when you are standing.  Stay at a healthy weight.  Wear comfortable shoes that support your feet. Avoid wearing high heels.  Avoid sleeping on a mattress that is too soft or too hard. You might have less pain if you sleep on a mattress that is firm enough to support your back.  Keep all follow-up visits as told by your doctor. This is important. Contact a doctor if:  You have pain that: ? Wakes you up when you are sleeping. ? Gets worse when you lie down. ?  Is worse than the pain you have had in the past. ? Lasts longer than 4 weeks.  You lose weight for without trying. Get help right away if:  You cannot control when you pee (urinate) or poop (have a bowel movement).  You have weakness in any of these areas and it gets worse. ? Lower back. ? Lower belly (pelvis). ? Butt (buttocks). ? Legs.  You have redness or swelling of your back.  You have a burning feeling when you pee. This information is not intended to replace advice given to you by your health care provider. Make sure you discuss any questions you have with your health care provider. Document Released: 09/30/2007 Document  Revised: 05/29/2015 Document Reviewed: 08/30/2014 Elsevier Interactive Patient Education  [employer].

## 2017-11-23 ENCOUNTER — Encounter: Payer: Self-pay | Admitting: Family Medicine

## 2017-11-23 ENCOUNTER — Ambulatory Visit (INDEPENDENT_AMBULATORY_CARE_PROVIDER_SITE_OTHER): Payer: Medicare Other | Admitting: Family Medicine

## 2017-11-23 VITALS — BP 130/84 | HR 120 | Temp 97.4°F | Ht 65.0 in | Wt 140.6 lb

## 2017-11-23 DIAGNOSIS — I480 Paroxysmal atrial fibrillation: Secondary | ICD-10-CM

## 2017-11-23 DIAGNOSIS — K580 Irritable bowel syndrome with diarrhea: Secondary | ICD-10-CM | POA: Diagnosis not present

## 2017-11-23 LAB — COAGUCHEK XS/INR WAIVED
INR: 1.4 — ABNORMAL HIGH (ref 0.9–1.1)
Prothrombin Time: 17.4 s

## 2017-11-23 MED ORDER — ELUXADOLINE 75 MG PO TABS: 75.0000 mg | ORAL_TABLET | Freq: Two times a day (BID) | ORAL | 0 refills | Status: DC

## 2017-11-23 MED ORDER — VILAZODONE HCL 10 MG PO TABS: 10.0000 mg | ORAL_TABLET | Freq: Every day | ORAL | 0 refills | Status: DC

## 2017-11-23 NOTE — Progress Notes (Signed)
BP 130/84   Pulse (!) 120   Temp (!) 97.4 F (36.3 C) (Oral)   Ht 5\' 5"  (1.651 m)   Wt 140 lb 9.6 oz (63.8 kg)   BMI 23.40 kg/m    Subjective:    Patient ID: [name], female    DOB: 1932/03/08, 84 y.o.   MRN: [id number]  HPI: [name] is a 84 y.o. female presenting on 11/23/2017 for Coagulation Disorder (2 week re check)   HPI Patient is coming in for Coumadin recheck.  She was adjusted on her Coumadin because she was on an antibiotic and a steroid.  She finished the antibiotic 2 days ago and has now gone back to her previous dose but is only been over the past 2 days.  She is now taking on Mondays and Fridays 5 mg and the rest of the week 2.5 mg and would be reduced it we went to 2.5 mg every day.  Her INR today is low at 1.4 and we will increase back to her previous dose and have her continue on it and follow-up in 4 weeks.  She denies any bleeding episodes.  She is slightly tachycardic with A. fib but she runs up there.  Patient complains of IBS diarrhea and says is been going on for years but she is wondering if there is something that can help.  She has been taking Imodium very frequently but she says every time she goes out she will have to run to the restroom.  She has had colonoscopies in the past and they did not initially find anything.  She denies any blood in her stool or abdominal pain just mainly she has the irritable bowel that will run and make her run to the bathroom.  Relevant past medical, surgical, family and social history reviewed and updated as indicated. Interim medical history since our last visit reviewed. Allergies and medications reviewed and updated.  Review of Systems  Constitutional: Negative for chills and fever.  Eyes: Negative for visual disturbance.  Respiratory: Negative for chest tightness and shortness of breath.   Cardiovascular: Negative for chest pain and leg swelling.  Gastrointestinal: Positive for diarrhea. Negative for  constipation.  Musculoskeletal: Negative for back pain and gait problem.  Skin: Negative for rash.  Neurological: Negative for light-headedness and headaches.  Psychiatric/Behavioral: Negative for agitation and behavioral problems.  All other systems reviewed and are negative.   Per HPI unless specifically indicated above   Allergies as of 11/23/2017      Reactions   Penicillins Rash, Other (See Comments)   Has patient had a PCN reaction causing immediate rash, facial/tongue/throat swelling, SOB or lightheadedness with hypotension: No Has patient had a PCN reaction causing severe rash involving mucus membranes or skin necrosis: No Has patient had a PCN reaction that required hospitalization No Has patient had a PCN reaction occurring within the last 10 years: No If all of the above answers are "NO", then may proceed with Cephalosporin use.      Medication List        Accurate as of 11/23/17  3:15 PM. Always use your most recent med list.          ARTIFICIAL TEAR OP Apply 2 drops to eye daily as needed (Dryness). To the left eye   atorvastatin 20 MG tablet Commonly known as:  LIPITOR Take 1 tablet (20 mg total) by mouth at bedtime.   donepezil 5 MG tablet Commonly known as:  ARICEPT Take  1 tablet (5 mg total) by mouth at bedtime. FOR MEMORY   flecainide 50 MG tablet Commonly known as:  TAMBOCOR Take 0.5 tablets (25 mg total) by mouth 2 (two) times daily.   irbesartan 150 MG tablet Commonly known as:  AVAPRO TAKE 1 TABLET EVERY DAY (Needs to see PCP)   levETIRAcetam 250 MG tablet Commonly known as:  KEPPRA Take 1 tablet (250 mg total) by mouth 2 (two) times daily.   levothyroxine 50 MCG tablet Commonly known as:  SYNTHROID, LEVOTHROID Take 1 tablet daily before breakfast   metoprolol succinate 100 MG 24 hr tablet Commonly known as:  TOPROL-XL Take 1 tablet (100 mg total) by mouth daily. Take with or immediately following a meal.   mirtazapine 15 MG  tablet Commonly known as:  REMERON Take 1 tablet (15 mg total) by mouth at bedtime.   pantoprazole 40 MG tablet Commonly known as:  PROTONIX Take 1 tablet (40 mg total) by mouth daily.   potassium chloride SA 20 MEQ tablet Commonly known as:  K-DUR,KLOR-CON Take 0.5 tablets (10 mEq total) by mouth daily.   traMADol 50 MG tablet Commonly known as:  ULTRAM Take 1 tablet (50 mg total) by mouth every 12 (twelve) hours as needed.   TYLENOL 325 MG Caps Generic drug:  Acetaminophen Take 650 mg by mouth every 6 (six) hours as needed for mild pain.   warfarin 5 MG tablet Commonly known as:  COUMADIN Take as directed by the anticoagulation clinic. If you are unsure how to take this medication, talk to your nurse or doctor. Original instructions:  Take 1 tablet (5 mg total) by mouth daily.          Objective:    BP 130/84   Pulse (!) 120   Temp (!) 97.4 F (36.3 C) (Oral)   Ht 5\' 5"  (1.651 m)   Wt 140 lb 9.6 oz (63.8 kg)   BMI 23.40 kg/m   Wt Readings from Last 3 Encounters:  11/23/17 140 lb 9.6 oz (63.8 kg)  11/16/17 139 lb (63 kg)  11/09/17 137 lb 6.4 oz (62.3 kg)    Physical Exam  Constitutional: She is oriented to person, place, and time. She appears well-developed and well-nourished. No distress.  Eyes: Conjunctivae are normal.  Cardiovascular: Normal rate, regular rhythm, normal heart sounds and intact distal pulses.  No murmur heard. Pulmonary/Chest: Effort normal and breath sounds normal. No respiratory distress. She has no wheezes.  Abdominal: Soft. Bowel sounds are normal. She exhibits no distension. There is no tenderness. There is no guarding. No hernia.  Musculoskeletal: Normal range of motion. She exhibits no edema.  Neurological: She is alert and oriented to person, place, and time. Coordination normal.  Skin: Skin is warm and dry. No rash noted. She is not diaphoretic.  Psychiatric: She has a normal mood and affect. Her behavior is normal.  Nursing note and  vitals reviewed.   Description   Today's INR is 1.4  (Goal is 2-3), dose has been reduced because of prednisone and antibiotic, will go back onto her previous dose  Increase to take Warfarin 5 mg (1 tablet) on Monday and Friday and  Take 2.5 mg (1/2 tab) all other days.         Assessment & Plan:   Problem List Items Addressed This Visit      Cardiovascular and Mediastinum   Paroxysmal atrial fibrillation ([place]) - Primary   Relevant Orders   CoaguChek XS/INR Waived    Other  Visit Diagnoses    Irritable bowel syndrome with diarrhea       Relevant Medications   Vilazodone HCl (VIIBRYD) 10 MG TABS   Eluxadoline (VIBERZI) 75 MG TABS       Follow up plan: Return in about 4 weeks (around 12/21/2017), or if symptoms worsen or fail to improve, for INR recheck with Sharyn Lull in 4 weeks.  Counseling provided for all of the vaccine components Orders Placed This Encounter  Procedures  . CoaguChek XS/INR [place], MD [facility] 11/23/2017, 3:15 PM

## 2017-11-29 ENCOUNTER — Other Ambulatory Visit: Payer: Self-pay | Admitting: *Deleted

## 2017-11-29 ENCOUNTER — Telehealth: Payer: Self-pay

## 2017-11-29 MED ORDER — POTASSIUM CHLORIDE CRYS ER 10 MEQ PO TBCR: 10.0000 meq | EXTENDED_RELEASE_TABLET | Freq: Every day | ORAL | 1 refills | Status: DC

## 2017-11-29 NOTE — Telephone Encounter (Signed)
Insurance denied Viberzi  Needs to try Zifaxan and one other Diphenoxylate atropine or Loperamide

## 2017-11-30 ENCOUNTER — Ambulatory Visit: Payer: Self-pay | Admitting: *Deleted

## 2017-11-30 MED ORDER — RIFAXIMIN 550 MG PO TABS: 550.0000 mg | ORAL_TABLET | Freq: Three times a day (TID) | ORAL | 0 refills | Status: DC

## 2017-11-30 NOTE — Chronic Care Management (AMB) (Signed)
  Chronic Care Management   Note  11/30/2017 Name: MONIA TIMMERS MRN: [id number] DOB: Sep 25, 1932  Initial telephone outreach to Mrs. Zaucha today to introduce CCM services in response to a referral from health plan. We were unable to reach Mrs Granberry today and were unable to leave a message.   Plan: Telephone follow up over the next 7 days.   [place]  [phone]

## 2017-11-30 NOTE — Telephone Encounter (Signed)
Message given to family, medicine sent to pharmacy for IBS.

## 2017-11-30 NOTE — Telephone Encounter (Signed)
Please let patient know I sent Xifaxan which is a 2-week course for IBS with diarrhea, have her try this

## 2017-12-06 ENCOUNTER — Encounter: Payer: Self-pay | Admitting: Family Medicine

## 2017-12-06 ENCOUNTER — Ambulatory Visit (INDEPENDENT_AMBULATORY_CARE_PROVIDER_SITE_OTHER): Payer: Medicare Other | Admitting: Family Medicine

## 2017-12-06 VITALS — BP 137/88 | HR 107 | Temp 97.1°F | Ht 65.0 in | Wt 139.0 lb

## 2017-12-06 DIAGNOSIS — R300 Dysuria: Secondary | ICD-10-CM | POA: Diagnosis not present

## 2017-12-06 DIAGNOSIS — R3129 Other microscopic hematuria: Secondary | ICD-10-CM

## 2017-12-06 LAB — URINALYSIS, COMPLETE
BILIRUBIN UA: NEGATIVE
Glucose, UA: NEGATIVE
KETONES UA: NEGATIVE
Leukocytes, UA: NEGATIVE
NITRITE UA: NEGATIVE
PH UA: 5.5 (ref 5.0–7.5)
Protein, UA: NEGATIVE
RBC UA: NEGATIVE
SPEC GRAV UA: 1.01 (ref 1.005–1.030)
Urobilinogen, Ur: 0.2 mg/dL (ref 0.2–1.0)

## 2017-12-06 LAB — MICROSCOPIC EXAMINATION
Bacteria, UA: NONE SEEN
Renal Epithel, UA: NONE SEEN /hpf

## 2017-12-06 NOTE — Progress Notes (Signed)
TDV:[id number], Fransisca Kaufmann, MD Chief Complaint  Patient presents with  . Urinary Tract Infection    urinary hesitancy, unable to empty bladder    Current Issues:  Presents with 2 days of dysuria and urinary urgency Associated symptoms include:  lower abdominal pain. She denies fever, chills, flank pain, confusion, nausea, vomiting, or weakness.   There is a previous history of of similar symptoms. Sexually active:  No   No concern for STI.  Prior to Admission medications   Medication Sig Start Date End Date Taking? Authorizing Provider  Acetaminophen (TYLENOL) 325 MG CAPS Take 650 mg by mouth every 6 (six) hours as needed for mild pain.   Yes [provider]  ARTIFICIAL TEAR OP Apply 2 drops to eye daily as needed (Dryness). To the left eye   Yes [provider]  atorvastatin (LIPITOR) 20 MG tablet Take 1 tablet (20 mg total) by mouth at bedtime. 11/09/17  Yes Dettinger, Fransisca Kaufmann, MD  donepezil (ARICEPT) 5 MG tablet Take 1 tablet (5 mg total) by mouth at bedtime. FOR MEMORY 10/18/17  Yes Dettinger, Fransisca Kaufmann, MD  Eluxadoline (VIBERZI) 75 MG TABS Take 75 mg by mouth 2 (two) times daily. 11/23/17  Yes Dettinger, Fransisca Kaufmann, MD  flecainide (TAMBOCOR) 50 MG tablet Take 0.5 tablets (25 mg total) by mouth 2 (two) times daily. 03/17/17  Yes Regalado, Belkys A, MD  irbesartan (AVAPRO) 150 MG tablet TAKE 1 TABLET EVERY DAY (Needs to see PCP) 10/10/17  Yes Dettinger, Fransisca Kaufmann, MD  levETIRAcetam (KEPPRA) 250 MG tablet Take 1 tablet (250 mg total) by mouth 2 (two) times daily. 11/09/17  Yes Dettinger, Fransisca Kaufmann, MD  levothyroxine (SYNTHROID, LEVOTHROID) 50 MCG tablet Take 1 tablet daily before breakfast 11/09/17  Yes Dettinger, Fransisca Kaufmann, MD  metoprolol succinate (TOPROL-XL) 100 MG 24 hr tablet Take 1 tablet (100 mg total) by mouth daily. Take with or immediately following a meal. 11/09/17  Yes Dettinger, Fransisca Kaufmann, MD  mirtazapine (REMERON) 15 MG tablet Take 1 tablet (15 mg total) by mouth at  bedtime. 11/09/17  Yes Dettinger, Fransisca Kaufmann, MD  pantoprazole (PROTONIX) 40 MG tablet Take 1 tablet (40 mg total) by mouth daily. 11/09/17  Yes Dettinger, Fransisca Kaufmann, MD  potassium chloride SA (K-DUR,KLOR-CON) 10 MEQ tablet Take 1 tablet (10 mEq total) by mouth daily. 11/29/17  Yes Dettinger, Fransisca Kaufmann, MD  rifaximin (XIFAXAN) 550 MG TABS tablet Take 1 tablet (550 mg total) by mouth 3 (three) times daily. 11/30/17  Yes Dettinger, Fransisca Kaufmann, MD  traMADol (ULTRAM) 50 MG tablet Take 1 tablet (50 mg total) by mouth every 12 (twelve) hours as needed. 07/04/17  Yes Timmothy Euler, MD  Vilazodone HCl (VIIBRYD) 10 MG TABS Take 1 tablet (10 mg total) by mouth daily. 11/23/17  Yes Dettinger, Fransisca Kaufmann, MD  warfarin (COUMADIN) 5 MG tablet Take 1 tablet (5 mg total) by mouth daily. 03/17/17  Yes Regalado, Belkys A, MD    Review of Systems  Constitutional: Negative for chills, fever and malaise/fatigue.  Cardiovascular: Negative for chest pain.  Gastrointestinal: Positive for abdominal pain (lower abdominal pressure). Negative for constipation, diarrhea, nausea and vomiting.  Genitourinary: Positive for dysuria and urgency. Negative for flank pain, frequency and hematuria.  Neurological: Negative for weakness and headaches.  All other systems reviewed and are negative.     PE:  BP 137/88 (BP Location: Right Arm, Cuff Size: Normal)   Pulse (!) 107   Temp (!) 97.1 F (36.2 C) (Oral)  Ht 5\' 5"  (1.651 m)   Wt 139 lb (63 kg)   BMI 23.13 kg/m  Physical Exam  Constitutional: She is oriented to person, place, and time. She appears well-developed and well-nourished. She is cooperative. No distress.  HENT:  Head: Normocephalic.  Cardiovascular: Normal rate, regular rhythm and normal heart sounds. Exam reveals no gallop and no friction rub.  No murmur heard. Pulmonary/Chest: Effort normal and breath sounds normal. No respiratory distress.  Abdominal: Soft. Normal appearance. She exhibits no distension. There is  no tenderness. There is no CVA tenderness.  Neurological: She is alert and oriented to person, place, and time.  Skin: Skin is warm and dry. Capillary refill takes less than 2 seconds.  Psychiatric: She has a normal mood and affect. Her behavior is normal. Judgment and thought content normal.  Nursing note and vitals reviewed.   Results for orders placed or performed in visit on 11/23/17  CoaguChek XS/INR Waived  Result Value Ref Range   INR 1.4 (H) 0.9 - 1.1   Prothrombin Time 17.4 sec   Urinalysis in office: Positive for trace blood. Negative for leukocytes on dip.   Assessment and Plan:    1. Dysuria Proper hygiene discussed. Urine culture pending. Increase water intake. Report any new or worsening symptoms. Culture pending.  - Urinalysis, Complete - Urine culture  2. Other microscopic hematuria Increase water intake. Report any new or worsening symptoms. Culture pending.  - Urine culture   Return in 4 weeks (on 01/03/2018) for repeat urinalysis.    The above assessment and management plan was discussed with the patient. The patient verbalized understanding of and has agreed to the management plan. Patient is aware to call the clinic if symptoms fail to improve or worsen. Patient is aware when to return to the clinic for a follow-up visit. Patient educated on when it is appropriate to go to the emergency department.    Monia Pouch, FNP-C [facility] [address] [place], [place] [address] [phone]

## 2017-12-06 NOTE — Patient Instructions (Signed)

## 2017-12-08 LAB — URINE CULTURE

## 2017-12-09 ENCOUNTER — Telehealth: Payer: Self-pay | Admitting: Family Medicine

## 2017-12-09 ENCOUNTER — Other Ambulatory Visit: Payer: Self-pay | Admitting: Family Medicine

## 2017-12-09 DIAGNOSIS — R300 Dysuria: Secondary | ICD-10-CM

## 2017-12-09 DIAGNOSIS — R3129 Other microscopic hematuria: Secondary | ICD-10-CM

## 2017-12-09 MED ORDER — CEFDINIR 300 MG PO CAPS: 300.0000 mg | ORAL_CAPSULE | Freq: Two times a day (BID) | ORAL | 0 refills | Status: DC

## 2017-12-09 NOTE — Telephone Encounter (Signed)
Spoke to pt and advised her urine culture came back negative for UTI but pt states she is still having Urinary Urgency and she had discussed this at visit with Monia Pouch and Sharyn Lull had talked about calling in a rx for this if it wasn't a UTI. Please advise.

## 2017-12-09 NOTE — Telephone Encounter (Signed)
I will send something in, but she still needs to be seen in 4 weeks.

## 2017-12-09 NOTE — Telephone Encounter (Signed)
We need to recheck her urine in 4 weeks to see if the hematuria has resolved. Have her make an appointment for around then and we can discuss this further.

## 2017-12-09 NOTE — Telephone Encounter (Signed)
Pt aware and schedule for f/u 01/06/18 at 2:20.

## 2017-12-09 NOTE — Telephone Encounter (Signed)
Pt was advised of provider feedback and states she can't wait that long she needs something now because she only slept for 2 hours last night. I offered her an appt with you 12/12/17 but she says she can't wait that long she needs something now for the urgency.

## 2017-12-18 ENCOUNTER — Emergency Department (HOSPITAL_COMMUNITY)
Admission: EM | Admit: 2017-12-18 | Discharge: 2017-12-18 | Disposition: A | Discharge status: Home / Self Care | Payer: Medicare Other | Attending: Emergency Medicine | Admitting: Emergency Medicine

## 2017-12-18 ENCOUNTER — Emergency Department (HOSPITAL_COMMUNITY): Payer: Medicare Other

## 2017-12-18 ENCOUNTER — Other Ambulatory Visit: Payer: Self-pay

## 2017-12-18 ENCOUNTER — Encounter (HOSPITAL_COMMUNITY): Payer: Self-pay

## 2017-12-18 DIAGNOSIS — Z79899 Other long term (current) drug therapy: Secondary | ICD-10-CM | POA: Diagnosis not present

## 2017-12-18 DIAGNOSIS — I4891 Unspecified atrial fibrillation: Secondary | ICD-10-CM | POA: Diagnosis not present

## 2017-12-18 DIAGNOSIS — K5792 Diverticulitis of intestine, part unspecified, without perforation or abscess without bleeding: Secondary | ICD-10-CM | POA: Diagnosis not present

## 2017-12-18 DIAGNOSIS — E039 Hypothyroidism, unspecified: Secondary | ICD-10-CM | POA: Diagnosis not present

## 2017-12-18 DIAGNOSIS — R1032 Left lower quadrant pain: Secondary | ICD-10-CM | POA: Diagnosis not present

## 2017-12-18 DIAGNOSIS — I5032 Chronic diastolic (congestive) heart failure: Secondary | ICD-10-CM | POA: Diagnosis not present

## 2017-12-18 DIAGNOSIS — Z7901 Long term (current) use of anticoagulants: Secondary | ICD-10-CM | POA: Insufficient documentation

## 2017-12-18 DIAGNOSIS — I110 Hypertensive heart disease with heart failure: Secondary | ICD-10-CM | POA: Diagnosis not present

## 2017-12-18 DIAGNOSIS — R1084 Generalized abdominal pain: Secondary | ICD-10-CM | POA: Diagnosis present

## 2017-12-18 DIAGNOSIS — K5730 Diverticulosis of large intestine without perforation or abscess without bleeding: Secondary | ICD-10-CM | POA: Diagnosis not present

## 2017-12-18 DIAGNOSIS — R4182 Altered mental status, unspecified: Secondary | ICD-10-CM | POA: Diagnosis not present

## 2017-12-18 DIAGNOSIS — R4020 Unspecified coma: Secondary | ICD-10-CM | POA: Diagnosis not present

## 2017-12-18 DIAGNOSIS — R110 Nausea: Secondary | ICD-10-CM | POA: Diagnosis not present

## 2017-12-18 LAB — CBC WITH DIFFERENTIAL/PLATELET
Abs Immature Granulocytes: 0.06 10*3/uL (ref 0.00–0.07)
Basophils Absolute: 0.1 10*3/uL (ref 0.0–0.1)
Basophils Relative: 1 %
Eosinophils Absolute: 0.1 10*3/uL (ref 0.0–0.5)
Eosinophils Relative: 2 %
HCT: 38.4 % (ref 36.0–46.0)
Hemoglobin: 12.1 g/dL (ref 12.0–15.0)
Immature Granulocytes: 1 %
Lymphocytes Relative: 17 %
Lymphs Abs: 1.3 10*3/uL (ref 0.7–4.0)
MCH: 30.1 pg (ref 26.0–34.0)
MCHC: 31.5 g/dL (ref 30.0–36.0)
MCV: 95.5 fL (ref 80.0–100.0)
Monocytes Absolute: 1.1 10*3/uL — ABNORMAL HIGH (ref 0.1–1.0)
Monocytes Relative: 14 %
Neutro Abs: 5 10*3/uL (ref 1.7–7.7)
Neutrophils Relative %: 65 %
Platelets: 255 10*3/uL (ref 150–400)
RBC: 4.02 MIL/uL (ref 3.87–5.11)
RDW: 14.9 % (ref 11.5–15.5)
WBC: 7.5 10*3/uL (ref 4.0–10.5)
nRBC: 0 % (ref 0.0–0.2)

## 2017-12-18 LAB — URINALYSIS, ROUTINE W REFLEX MICROSCOPIC
Bilirubin Urine: NEGATIVE
Glucose, UA: NEGATIVE mg/dL
Hgb urine dipstick: NEGATIVE
Ketones, ur: NEGATIVE mg/dL
Leukocytes, UA: NEGATIVE
Nitrite: NEGATIVE
Protein, ur: NEGATIVE mg/dL
Specific Gravity, Urine: 1.013 (ref 1.005–1.030)
pH: 6 (ref 5.0–8.0)

## 2017-12-18 LAB — BASIC METABOLIC PANEL
Anion gap: 9 (ref 5–15)
BUN: 12 mg/dL (ref 8–23)
CO2: 26 mmol/L (ref 22–32)
Calcium: 8.3 mg/dL — ABNORMAL LOW (ref 8.9–10.3)
Chloride: 96 mmol/L — ABNORMAL LOW (ref 98–111)
Creatinine, Ser: 0.65 mg/dL (ref 0.44–1.00)
GFR calc Af Amer: >60 mL/min (ref >60)
GFR calc non Af Amer: >60 mL/min (ref >60)
Glucose, Bld: 97 mg/dL (ref 70–99)
Potassium: 3.8 mmol/L (ref 3.5–5.1)
Sodium: 131 mmol/L — ABNORMAL LOW (ref 135–145)

## 2017-12-18 MED ORDER — CIPROFLOXACIN HCL 500 MG PO TABS: 500.0000 mg | ORAL_TABLET | Freq: Two times a day (BID) | ORAL | 0 refills | Status: DC

## 2017-12-18 MED ORDER — IOPAMIDOL (ISOVUE-300) INJECTION 61%
100.0000 mL | Freq: Once | INTRAVENOUS | Status: AC | PRN
  Administered 2017-12-18: 100 mL via INTRAVENOUS

## 2017-12-18 MED ORDER — CIPROFLOXACIN HCL 250 MG PO TABS
500.0000 mg | ORAL_TABLET | Freq: Once | ORAL | Status: AC
  Administered 2017-12-18: 500 mg via ORAL
  Filled 2017-12-18: qty 2

## 2017-12-18 MED ORDER — METRONIDAZOLE 500 MG PO TABS
500.0000 mg | ORAL_TABLET | Freq: Once | ORAL | Status: AC
  Administered 2017-12-18: 500 mg via ORAL
  Filled 2017-12-18: qty 1

## 2017-12-18 MED ORDER — METRONIDAZOLE 500 MG PO TABS: 500.0000 mg | ORAL_TABLET | Freq: Two times a day (BID) | ORAL | 0 refills | Status: DC

## 2017-12-18 NOTE — ED Provider Notes (Signed)
[facility] EMERGENCY DEPARTMENT Provider Note   CSN: [id number] Arrival date & time: 12/18/17  1622     History   Chief Complaint Chief Complaint  Patient presents with  . Dysuria    HPI [name] is a 84 y.o. female.  HPI   84 year old female with abdominal pain.  Pain is lower abdomen.  She reports that she has been recently treated for urinary tract infection.  She is not sure what.  Continues to have some urinary symptoms and abdominal pain.  Some loose stools for the past several days.  No blood.  Mild nausea.  No vomiting.  Past Medical History:  Diagnosis Date  . Anemia   . Atrial fibrillation (HCC)    Paroxysmal, Flecainide therapy  . Calf pain    September, 2012, at rest  . Carotid bruit    Doppler, December, 2009, no abnormality  . Cataract   . Diverticulosis   . GERD (gastroesophageal reflux disease)   . History of shingles   . HLD (hyperlipidemia)   . HTN (hypertension)   . Hypothyroidism   . Insomnia   . Osteoarthritis   . PONV (postoperative nausea and vomiting)   . Rectal bleeding 2001   diverticulosis and int hemorrhoids on 07/1999 and 02/2010 colonoscopies.  . Stroke ([place])   . Warfarin anticoagulation     Patient Active Problem List   Diagnosis Date Noted  . History of TIA (transient ischemic attack) 03/15/2017  . Osteoporosis 02/24/2017  . Prolonged QT interval 01/28/2017  . Unstable angina ([place]) 06/16/2016  . Atrial flutter ([place]) 06/16/2016  . Hypomagnesemia 04/23/2016  . Overactive bladder 05/02/2015  . History of CVA (cerebrovascular accident) 04/21/2015  . History of dysarthria 02/20/2015  . HTN (hypertension) 01/11/2015  . Hyponatremia 01/11/2015  . Chronic pain syndrome 11/25/2014  . Insomnia 11/25/2014  . Chronic diastolic CHF (congestive heart failure) ([place]) 11/12/2014  . Paroxysmal atrial fibrillation ([place]) 10/14/2014  . Warfarin anticoagulation   . Hypothyroidism   . Carotid bruit   . S/P knee replacement   .  DIVERTICULOSIS-COLON 01/19/2010    Past Surgical History:  Procedure Laterality Date  . BIOPSY  09/29/2015   Procedure: BIOPSY;  Surgeon: Rogene Houston, MD;  [facility];  Service: Endoscopy;;  gastric  . CATARACT EXTRACTION W/PHACO Right 04/15/2014   Procedure: CATARACT EXTRACTION PHACO AND INTRAOCULAR LENS PLACEMENT (IOC);  Surgeon: Tonny Branch, MD;  Location: [facility] ORS;  Service: Ophthalmology;  Laterality: Right;  CDE: 13.30  . CATARACT EXTRACTION W/PHACO Left 05/13/2014   Procedure: CATARACT EXTRACTION PHACO AND INTRAOCULAR LENS PLACEMENT LEFT EYE;  Surgeon: Tonny Branch, MD;  Location: [facility];  Service: Ophthalmology;  Laterality: Left;  CDE:13.00  . CHOLECYSTECTOMY N/A 01/15/2015   Procedure: LAPAROSCOPIC CHOLECYSTECTOMY;  Surgeon: Ralene Ok, MD;  Location: [place];  Service: General;  Laterality: N/A;  . COLONOSCOPY N/A 09/29/2015   Procedure: COLONOSCOPY;  Surgeon: Rogene Houston, MD;  [facility];  Service: Endoscopy;  Laterality: N/A;  . COLONOSCOPY    . ESOPHAGOGASTRODUODENOSCOPY N/A 01/13/2015   Procedure: ESOPHAGOGASTRODUODENOSCOPY (EGD);  Surgeon: Jerene Bears, MD;  Location: [facility] ENDOSCOPY;  Service: Endoscopy;  Laterality: N/A;  . ESOPHAGOGASTRODUODENOSCOPY N/A 09/29/2015   Procedure: ESOPHAGOGASTRODUODENOSCOPY (EGD);  Surgeon: Rogene Houston, MD;  [facility];  Service: Endoscopy;  Laterality: N/A;  12:15  . GALLBLADDER SURGERY    . LEFT HEART CATH AND CORONARY ANGIOGRAPHY N/A 06/22/2016   Procedure: Left Heart Cath and Coronary Angiography;  Surgeon: Troy Sine, MD;  Location: [place] CV LAB;  Service: Cardiovascular;  Laterality: N/A;  . SHOULDER OPEN ROTATOR CUFF REPAIR     bilateral  . TOTAL KNEE ARTHROPLASTY  05/26/10   right  . TOTAL KNEE ARTHROPLASTY  11/17/2010   Procedure: TOTAL KNEE ARTHROPLASTY;  Surgeon: Mauri Pole;  [facility];  Service: Orthopedics;  Laterality: Left;  . TOTAL KNEE ARTHROPLASTY     Right  .  TOTAL SHOULDER REPLACEMENT  01/2010   left     OB History    Gravida  1   Para  1   Term  1   Preterm      AB      Living        SAB      TAB      Ectopic      Multiple      Live Births               Home Medications    Prior to Admission medications   Medication Sig Start Date End Date Taking? Authorizing Provider  Acetaminophen (TYLENOL) 325 MG CAPS Take 650 mg by mouth every 6 (six) hours as needed for mild pain.   Yes [provider]  ARTIFICIAL TEAR OP Apply 2 drops to eye daily as needed (Dryness). To the left eye   Yes [provider]  atorvastatin (LIPITOR) 20 MG tablet Take 1 tablet (20 mg total) by mouth at bedtime. 11/09/17  Yes Dettinger, Fransisca Kaufmann, MD  cefdinir (OMNICEF) 300 MG capsule Take 1 capsule (300 mg total) by mouth 2 (two) times daily. 1 po BID 12/09/17  Yes Rakes, Connye Burkitt, FNP  donepezil (ARICEPT) 5 MG tablet Take 1 tablet (5 mg total) by mouth at bedtime. FOR MEMORY 10/18/17  Yes Dettinger, Fransisca Kaufmann, MD  Eluxadoline (VIBERZI) 75 MG TABS Take 75 mg by mouth 2 (two) times daily. 11/23/17  Yes Dettinger, Fransisca Kaufmann, MD  flecainide (TAMBOCOR) 50 MG tablet Take 0.5 tablets (25 mg total) by mouth 2 (two) times daily. 03/17/17  Yes Regalado, Belkys A, MD  irbesartan (AVAPRO) 150 MG tablet TAKE 1 TABLET EVERY DAY (Needs to see PCP) 10/10/17  Yes Dettinger, Fransisca Kaufmann, MD  levETIRAcetam (KEPPRA) 250 MG tablet Take 1 tablet (250 mg total) by mouth 2 (two) times daily. 11/09/17  Yes Dettinger, Fransisca Kaufmann, MD  levothyroxine (SYNTHROID, LEVOTHROID) 50 MCG tablet Take 1 tablet daily before breakfast 11/09/17  Yes Dettinger, Fransisca Kaufmann, MD  metoprolol succinate (TOPROL-XL) 100 MG 24 hr tablet Take 1 tablet (100 mg total) by mouth daily. Take with or immediately following a meal. 11/09/17  Yes Dettinger, Fransisca Kaufmann, MD  mirtazapine (REMERON) 15 MG tablet Take 1 tablet (15 mg total) by mouth at bedtime. 11/09/17  Yes Dettinger, Fransisca Kaufmann, MD  pantoprazole  (PROTONIX) 40 MG tablet Take 1 tablet (40 mg total) by mouth daily. 11/09/17  Yes Dettinger, Fransisca Kaufmann, MD  potassium chloride SA (K-DUR,KLOR-CON) 10 MEQ tablet Take 1 tablet (10 mEq total) by mouth daily. 11/29/17  Yes Dettinger, Fransisca Kaufmann, MD  rifaximin (XIFAXAN) 550 MG TABS tablet Take 1 tablet (550 mg total) by mouth 3 (three) times daily. 11/30/17  Yes Dettinger, Fransisca Kaufmann, MD  traMADol (ULTRAM) 50 MG tablet Take 1 tablet (50 mg total) by mouth every 12 (twelve) hours as needed. 07/04/17  Yes Timmothy Euler, MD  Vilazodone HCl (VIIBRYD) 10 MG TABS Take 1 tablet (10 mg total) by  mouth daily. 11/23/17  Yes Dettinger, Fransisca Kaufmann, MD  warfarin (COUMADIN) 5 MG tablet Take 1 tablet (5 mg total) by mouth daily. 03/17/17  Yes Regalado, Cassie Freer, MD    Family History Family History  Problem Relation Age of Onset  . COPD Brother   . Atrial fibrillation Brother   . Diabetes Brother   . Liver cancer Brother        Alcohol-related  . Colon cancer Brother   . Heart attack Father   . Early death Father   . Rheum arthritis Mother   . Arthritis Mother   . Heart disease Mother   . Coronary artery disease Brother   . Heart attack Daughter   . Gallbladder disease Brother     Social History Social History   Tobacco Use  . Smoking status: Never Smoker  . Smokeless tobacco: Never Used  Substance Use Topics  . Alcohol use: No  . Drug use: No     Allergies   Penicillins   Review of Systems Review of Systems All systems reviewed and negative, other than as noted in HPI.   Physical Exam Updated Vital Signs BP (!) 147/105   Pulse (!) 110   Temp 98.2 F (36.8 C) (Temporal)   Resp 12   Ht 5\' 3"  (1.6 m)   Wt 59 kg   SpO2 94%   BMI 23.03 kg/m   Physical Exam Vitals signs and nursing note reviewed.  Constitutional:      General: She is not in acute distress.    Appearance: She is well-developed.  HENT:     Head: Normocephalic and atraumatic.  Eyes:     General:        Right eye:  No discharge.        Left eye: No discharge.     Conjunctiva/sclera: Conjunctivae normal.  Neck:     Musculoskeletal: Neck supple.  Cardiovascular:     Rate and Rhythm: Normal rate and regular rhythm.     Heart sounds: Normal heart sounds. No murmur. No friction rub. No gallop.   Pulmonary:     Effort: Pulmonary effort is normal. No respiratory distress.     Breath sounds: Normal breath sounds.  Abdominal:     General: There is no distension.     Palpations: Abdomen is soft.     Tenderness: There is abdominal tenderness.     Comments: Mild to moderate tenderness suprapubically left lower quadrant without rebound or guarding.  No distention.  Musculoskeletal:        General: No tenderness.  Skin:    General: Skin is warm and dry.  Neurological:     Mental Status: She is alert.  Psychiatric:        Behavior: Behavior normal.        Thought Content: Thought content normal.      ED Treatments / Results  Labs (all labs ordered are listed, but only abnormal results are displayed) Labs Reviewed  URINALYSIS, ROUTINE W REFLEX MICROSCOPIC - Abnormal; Notable for the following components:      Result Value   APPearance HAZY (*)    All other components within normal limits  CBC WITH DIFFERENTIAL/PLATELET - Abnormal; Notable for the following components:   Monocytes Absolute 1.1 (*)    All other components within normal limits  BASIC METABOLIC PANEL - Abnormal; Notable for the following components:   Sodium 131 (*)    Chloride 96 (*)    Calcium 8.3 (*)  All other components within normal limits    EKG None  Radiology Ct Head Wo Contrast  Result Date: 12/18/2017 CLINICAL DATA:  Altered level of consciousness EXAM: CT HEAD WITHOUT CONTRAST TECHNIQUE: Contiguous axial images were obtained from the base of the skull through the vertex without intravenous contrast. COMPARISON:  03/15/2017 FINDINGS: Brain: There is atrophy and chronic small vessel disease changes. No acute  intracranial abnormality. Specifically, no hemorrhage, hydrocephalus, mass lesion, acute infarction, or significant intracranial injury. Vascular: No hyperdense vessel or unexpected calcification. Skull: No acute calvarial abnormality. Sinuses/Orbits: Visualized paranasal sinuses and mastoids clear. Orbital soft tissues unremarkable. Other: None IMPRESSION: No acute intracranial abnormality. Atrophy, chronic microvascular disease. Electronically Signed   By: Rolm Baptise M.D.   On: 12/18/2017 22:18   Ct Abdomen Pelvis W Contrast  Result Date: 12/18/2017 CLINICAL DATA:  Dysuria, on antibiotics for urinary tract infection. Diarrhea for 2 months. Confusion today. Assess for diverticulitis. History of cholecystectomy. EXAM: CT ABDOMEN AND PELVIS WITH CONTRAST TECHNIQUE: Multidetector CT imaging of the abdomen and pelvis was performed using the standard protocol following bolus administration of intravenous contrast. CONTRAST:  150mL ISOVUE-300 IOPAMIDOL (ISOVUE-300) INJECTION 61% COMPARISON:  CT abdomen and pelvis September 28, 2015 FINDINGS: LOWER CHEST: Lung bases are clear. Included heart is mildly enlarged with enlarged RIGHT atrium seen with RIGHT heart failure. Mild coronary artery calcifications. No pericardial effusion. HEPATOBILIARY: Status post cholecystectomy. Diffusely hypodense liver. PANCREAS: Normal. SPLEEN: Stable 12 mm cyst versus lymphangioma this playing. ADRENALS/URINARY TRACT: Kidneys are orthotopic, demonstrating symmetric enhancement. No nephrolithiasis, hydronephrosis or solid renal masses. 2.8 cm similar cyst upper pole RIGHT kidney. Too small to characterize hypodensity lower pole LEFT kidney. The unopacified ureters are normal in course and caliber. Delayed imaging through the kidneys demonstrates symmetric prompt contrast excretion within the proximal urinary collecting system. Urinary bladder is adequately distended and unremarkable. Normal adrenal glands. STOMACH/BOWEL: The stomach,  small and large bowel are normal in course and caliber without inflammatory changes. Severe descending and sigmoid colonic diverticulosis, moderate throughout the remainder of the colon. Mild rectosigmoid pericolonic fat stranding and wall thickening. Small gastric cardia diverticulum. Normal appendix. VASCULAR/LYMPHATIC: Aortoiliac vessels are normal in course and caliber. Moderate to severe calcific atherosclerosis. No lymphadenopathy by CT size criteria. REPRODUCTIVE: Normal. OTHER: Minimal free fluid LEFT pelvis without focal fluid collection or intraperitoneal free air. MUSCULOSKELETAL: Nonacute. Multiple old bilateral rib fractures. Lumbar dextroscoliosis. Severe spondylosis with straightened lordosis. Osteopenia. IMPRESSION: 1. Severe colonic diverticulosis, mild acute rectosigmoid diverticulitis without complication. 2. No CT findings of urinary tract infection. 3. Hepatic steatosis. Aortic Atherosclerosis (ICD10-I70.0). Electronically Signed   By: Elon Alas M.D.   On: 12/18/2017 22:30    Procedures Procedures (including critical care time)  Medications Ordered in ED Medications  iopamidol (ISOVUE-300) 61 % injection 100 mL (100 mLs Intravenous Contrast Given 12/18/17 2150)     Initial Impression / Assessment and Plan / ED Course  I have reviewed the triage vital signs and the nursing notes.  Pertinent labs & imaging results that were available during my care of the patient were reviewed by me and considered in my medical decision making (see chart for details).     84 year old female with lower abdominal pain.  Imaging is consistent with diverticulitis.  No perforation or abscess.  She is nontoxic.  I feel she is appropriate for outpatient management. Final Clinical Impressions(s) / ED Diagnoses   Final diagnoses:  Diverticulitis    ED Discharge Orders  Ordered    metroNIDAZOLE (FLAGYL) 500 MG tablet  2 times daily     12/18/17 2246    ciprofloxacin (CIPRO) 500  MG tablet  Every 12 hours     12/18/17 2246           Virgel Manifold, MD 12/29/17 2354

## 2017-12-18 NOTE — ED Notes (Signed)
Unable to obtain labs from IV access

## 2017-12-18 NOTE — ED Notes (Signed)
ED Provider at bedside. 

## 2017-12-18 NOTE — ED Triage Notes (Addendum)
Pt has been on antibiotics for UTI for approx 2 weeks. Pt is unsure . Pt has almost completed antibiotics and conts to burn. Pt also reports that she has been having diarrhea for 2 months. Pt reports 3-4 loose stools daily. Son reports EMS was called due to increased confusion today. Possibly got evening meds and morning meds confused

## 2017-12-20 NOTE — Progress Notes (Signed)
HPI The patient has a history of atrial fibrillation and premature contractions.  Since I last saw her her husband died.  She lives by herself.   Her neighbor checks on her routinely and says that her memory has gotten worse since her husband died.  She actually does not feel well either.  She has had increased fatigue.  She had problems with her abdomen as well as been diagnosed with diverticulitis and was in the emergency room at [facility].  I did review these records.  I note that for multiple readings going back to September her heart rates been in the 120s.  I do see one EKG from October when she was in atrial fibrillation with a rapid rate.  There is no record that she has been in atrial fibrillation.  She typically has felt her fibrillation but she turns out to be in fibrillation today.  She does not really notice this.  She is not having any presyncope or syncope.  She is not having any chest pressure, neck or arm discomfort.  She has had no weight gain or edema.    Allergies  Allergen Reactions  . Penicillins Rash and Other (See Comments)    Has patient had a PCN reaction causing immediate rash, facial/tongue/throat swelling, SOB or lightheadedness with hypotension: No Has patient had a PCN reaction causing severe rash involving mucus membranes or skin necrosis: No Has patient had a PCN reaction that required hospitalization No Has patient had a PCN reaction occurring within the last 10 years: No If all of the above answers are "NO", then may proceed with Cephalosporin use.    Current Outpatient Medications  Medication Sig Dispense Refill  . Acetaminophen (TYLENOL) 325 MG CAPS Take 650 mg by mouth every 6 (six) hours as needed for mild pain.    Marland Kitchen ARTIFICIAL TEAR OP Apply 2 drops to eye daily as needed (Dryness). To the left eye    . atorvastatin (LIPITOR) 20 MG tablet Take 1 tablet (20 mg total) by mouth at bedtime. 90 tablet 3  . cefdinir (OMNICEF) 300 MG capsule Take 1  capsule (300 mg total) by mouth 2 (two) times daily. 1 po BID 20 capsule 0  . ciprofloxacin (CIPRO) 500 MG tablet Take 1 tablet (500 mg total) by mouth every 12 (twelve) hours. 10 tablet 0  . donepezil (ARICEPT) 5 MG tablet Take 1 tablet (5 mg total) by mouth at bedtime. FOR MEMORY 30 tablet 5  . Eluxadoline (VIBERZI) 75 MG TABS Take 75 mg by mouth 2 (two) times daily. 60 tablet 0  . flecainide (TAMBOCOR) 100 MG tablet 100 mg. Take 1/2 tablet 50 mg by mouth twice daily    . irbesartan (AVAPRO) 150 MG tablet TAKE 1 TABLET EVERY DAY (Needs to see PCP) 30 tablet 4  . levETIRAcetam (KEPPRA) 250 MG tablet Take 1 tablet (250 mg total) by mouth 2 (two) times daily. 180 tablet 3  . levothyroxine (SYNTHROID, LEVOTHROID) 50 MCG tablet Take 1 tablet daily before breakfast 90 tablet 3  . metoprolol succinate (TOPROL-XL) 100 MG 24 hr tablet Take 1 tablet (100 mg total) by mouth daily. Take with or immediately following a meal. 90 tablet 3  . metroNIDAZOLE (FLAGYL) 500 MG tablet Take 1 tablet (500 mg total) by mouth 2 (two) times daily. 14 tablet 0  . mirtazapine (REMERON) 15 MG tablet Take 1 tablet (15 mg total) by mouth at bedtime. 30 tablet 5  . pantoprazole (PROTONIX) 40 MG tablet  Take 1 tablet (40 mg total) by mouth daily. 90 tablet 3  . potassium chloride SA (K-DUR,KLOR-CON) 10 MEQ tablet Take 1 tablet (10 mEq total) by mouth daily. 90 tablet 1  . rifaximin (XIFAXAN) 550 MG TABS tablet Take 1 tablet (550 mg total) by mouth 3 (three) times daily. 42 tablet 0  . traMADol (ULTRAM) 50 MG tablet Take 1 tablet (50 mg total) by mouth every 12 (twelve) hours as needed. 14 tablet 0  . Vilazodone HCl (VIIBRYD) 10 MG TABS Take 1 tablet (10 mg total) by mouth daily. 30 tablet 0  . warfarin (COUMADIN) 5 MG tablet Take 1 tablet (5 mg total) by mouth daily. 10 tablet 0  . digoxin (LANOXIN) 0.125 MG tablet Take 1 tablet (0.125 mg total) by mouth daily. 90 tablet 3   No current facility-administered medications for this  visit.     Past Medical History:  Diagnosis Date  . Anemia   . Atrial fibrillation (HCC)    Paroxysmal, Flecainide therapy  . Calf pain    September, 2012, at rest  . Carotid bruit    Doppler, December, 2009, no abnormality  . Cataract   . Diverticulosis   . GERD (gastroesophageal reflux disease)   . History of shingles   . HLD (hyperlipidemia)   . HTN (hypertension)   . Hypothyroidism   . Insomnia   . Osteoarthritis   . PONV (postoperative nausea and vomiting)   . Rectal bleeding 2001   diverticulosis and int hemorrhoids on 07/1999 and 02/2010 colonoscopies.  . Stroke ([place])   . Warfarin anticoagulation     Past Surgical History:  Procedure Laterality Date  . BIOPSY  09/29/2015   Procedure: BIOPSY;  Surgeon: Rogene Houston, MD;  [facility];  Service: Endoscopy;;  gastric  . CATARACT EXTRACTION W/PHACO Right 04/15/2014   Procedure: CATARACT EXTRACTION PHACO AND INTRAOCULAR LENS PLACEMENT (IOC);  Surgeon: Tonny Branch, MD;  [facility];  Service: Ophthalmology;  Laterality: Right;  CDE: 13.30  . CATARACT EXTRACTION W/PHACO Left 05/13/2014   Procedure: CATARACT EXTRACTION PHACO AND INTRAOCULAR LENS PLACEMENT LEFT EYE;  Surgeon: Tonny Branch, MD;  [facility];  Service: Ophthalmology;  Laterality: Left;  CDE:13.00  . CHOLECYSTECTOMY N/A 01/15/2015   Procedure: LAPAROSCOPIC CHOLECYSTECTOMY;  Surgeon: Ralene Ok, MD;  Location: [place];  Service: General;  Laterality: N/A;  . COLONOSCOPY N/A 09/29/2015   Procedure: COLONOSCOPY;  Surgeon: Rogene Houston, MD;  [facility];  Service: Endoscopy;  Laterality: N/A;  . COLONOSCOPY    . ESOPHAGOGASTRODUODENOSCOPY N/A 01/13/2015   Procedure: ESOPHAGOGASTRODUODENOSCOPY (EGD);  Surgeon: Jerene Bears, MD;  Location[facility] END[facility];  Service: Endoscopy;  Laterality: N/A;  . ESOPHAGOGASTRODUODENOSCOPY N/A 09/29/2015   Procedure: ESOPHAGOGASTRODUODENOSCOPY (EGD);  Surgeon: Rogene Houston, MD;  [facility];   Service: Endoscopy;  Laterality: N/A;  12:15  . GALLBLADDER SURGERY    . LEFT HEART CATH AND CORONARY ANGIOGRAPHY N/A 06/22/2016   Procedure: Left Heart Cath and Coronary Angiography;  Surgeon: Troy Sine, MD;  Location: [place] CV LAB;  Service: Cardiovascular;  Laterality: N/A;  . SHOULDER OPEN ROTATOR CUFF REPAIR     bilateral  . TOTAL KNEE ARTHROPLASTY  05/26/10   right  . TOTAL KNEE ARTHROPLASTY  11/17/2010   Procedure: TOTAL KNEE ARTHROPLASTY;  Surgeon: Mauri Pole;  [facility];  Service: Orthopedics;  Laterality: Left;  . TOTAL KNEE ARTHROPLASTY     Right  . TOTAL SHOULDER REPLACEMENT  01/2010   left    ROS:    Positive for decreased memory and balance problems. Otherwise as stated in the HPI and negative for all other systems.  PHYSICAL EXAM BP (!) 98/58   Pulse (!) 121   Ht 5\' 3"  (1.6 m)   Wt 136 lb (61.7 kg)   BMI 24.09 kg/m   GENERAL: Frail appearing NECK:  No jugular venous distention, waveform within normal limits, carotid upstroke brisk and symmetric, no bruits, no thyromegaly LUNGS:  Clear to auscultation bilaterally CHEST:  Unremarkable HEART:  PMI not displaced or sustained,S1 and S2 within normal limits, no S3,  no clicks, no rubs, no murmurs, irregular ABD:  Flat, positive bowel sounds normal in frequency in pitch, no bruits, no rebound, no guarding, no midline pulsatile mass, no hepatomegaly, no splenomegaly EXT:  2 plus pulses throughout, no edema, no cyanosis no clubbing   EKG: Atrial fibrillation, rate 121, axis within normal limits, QTC prolonged, poor anterior R wave progression.  ASSESSMENT AND PLAN   ATRIAL FIB/FLUTTER:    Ms. [name] has a CHA2DS2 - VASc score of 6.  It looks like she has been in this rhythm persistently probably since September when her heart rate was noted to be higher.  I suspect this is persistent now and I am going to check a 48-hour Holter to make sure of that.  I sent a note to her pharmacist who is  checking her INR today.  Provided she is therapeutic I would plan to bring her back for a cardioversion.  Her blood pressure will not allow up titration of other rate control medications so many give her digoxin 0.125 mg daily.  This can be held on the morning of cardioversion.     HTN:  The blood pressure is running low which precludes titration of any AV nodal blocking agents.    (Greater than 40 minutes reviewing all data with greater than 50% face to face with the patient).

## 2017-12-21 ENCOUNTER — Ambulatory Visit (INDEPENDENT_AMBULATORY_CARE_PROVIDER_SITE_OTHER): Payer: Medicare Other | Admitting: Pharmacist Clinician (PhC)/ Clinical Pharmacy Specialist

## 2017-12-21 ENCOUNTER — Ambulatory Visit (INDEPENDENT_AMBULATORY_CARE_PROVIDER_SITE_OTHER): Payer: Medicare Other | Admitting: Cardiology

## 2017-12-21 ENCOUNTER — Encounter: Payer: Self-pay | Admitting: Cardiology

## 2017-12-21 VITALS — BP 98/58 | HR 121 | Ht 63.0 in | Wt 136.0 lb

## 2017-12-21 DIAGNOSIS — I4819 Other persistent atrial fibrillation: Secondary | ICD-10-CM

## 2017-12-21 DIAGNOSIS — Z7901 Long term (current) use of anticoagulants: Secondary | ICD-10-CM

## 2017-12-21 DIAGNOSIS — I480 Paroxysmal atrial fibrillation: Secondary | ICD-10-CM | POA: Diagnosis not present

## 2017-12-21 DIAGNOSIS — I10 Essential (primary) hypertension: Secondary | ICD-10-CM

## 2017-12-21 LAB — COAGUCHEK XS/INR WAIVED
INR: 1.4 — ABNORMAL HIGH (ref 0.9–1.1)
Prothrombin Time: 16.6 s

## 2017-12-21 MED ORDER — DIGOXIN 125 MCG PO TABS: 0.1250 mg | ORAL_TABLET | Freq: Every day | ORAL | 3 refills | Status: DC

## 2017-12-21 NOTE — Patient Instructions (Signed)
Description   Today's INR is 1.4 (goal 2-3)  Too thick today  Continue taking warfarin 2.5mg  a day while on both cipro and metronidazole.  Recheck INR on 12/23

## 2017-12-21 NOTE — Patient Instructions (Signed)
Medication Instructions:  Please start Digoxin 0.125 mg a day. Continue all other medications as listed.  If you need a refill on your cardiac medications before your next appointment, please call your pharmacy.   Testing/Procedures: Your physician has recommended that you wear a holter monitor for 48 hours. Holter monitors are medical devices that record the heart's electrical activity. Doctors most often use these monitors to diagnose arrhythmias. Arrhythmias are problems with the speed or rhythm of the heartbeat. The monitor is a small, portable device. You can wear one while you do your normal daily activities. This is usually used to diagnose what is causing palpitations/syncope (passing out).  Follow-Up: Follow up to be determined after the results of your holter monitor.  Thank you for choosing [facility]!!

## 2017-12-22 DIAGNOSIS — M5416 Radiculopathy, lumbar region: Secondary | ICD-10-CM | POA: Diagnosis not present

## 2017-12-22 DIAGNOSIS — M545 Low back pain: Secondary | ICD-10-CM | POA: Diagnosis not present

## 2017-12-23 ENCOUNTER — Encounter: Payer: Self-pay | Admitting: Family Medicine

## 2017-12-23 ENCOUNTER — Ambulatory Visit (INDEPENDENT_AMBULATORY_CARE_PROVIDER_SITE_OTHER): Payer: Medicare Other | Admitting: Family Medicine

## 2017-12-23 VITALS — BP 133/77 | HR 129 | Temp 97.0°F | Ht 63.0 in | Wt 138.0 lb

## 2017-12-23 DIAGNOSIS — Z8619 Personal history of other infectious and parasitic diseases: Secondary | ICD-10-CM | POA: Insufficient documentation

## 2017-12-23 DIAGNOSIS — F0390 Unspecified dementia without behavioral disturbance: Secondary | ICD-10-CM | POA: Insufficient documentation

## 2017-12-23 DIAGNOSIS — K5900 Constipation, unspecified: Secondary | ICD-10-CM

## 2017-12-23 DIAGNOSIS — K649 Unspecified hemorrhoids: Secondary | ICD-10-CM

## 2017-12-23 DIAGNOSIS — F03A0 Unspecified dementia, mild, without behavioral disturbance, psychotic disturbance, mood disturbance, and anxiety: Secondary | ICD-10-CM | POA: Insufficient documentation

## 2017-12-23 MED ORDER — DOCUSATE SODIUM 60 MG/15ML PO SYRP: 60.0000 mg | ORAL_SOLUTION | Freq: Two times a day (BID) | ORAL | 0 refills | Status: DC

## 2017-12-23 MED ORDER — POLYETHYLENE GLYCOL 3350 17 GM/SCOOP PO POWD: 17.0000 g | Freq: Every day | ORAL | 1 refills | Status: DC | PRN

## 2017-12-23 MED ORDER — MAGNESIUM HYDROXIDE 400 MG/5ML PO SUSP: 15.0000 mL | Freq: Every day | ORAL | 0 refills | Status: DC | PRN

## 2017-12-23 MED ORDER — HYDROCORTISONE ACETATE 25 MG RE SUPP: 25.0000 mg | Freq: Two times a day (BID) | RECTAL | 0 refills | Status: DC

## 2017-12-23 NOTE — Progress Notes (Signed)
BP 133/77   Pulse (!) 129   Temp (!) 97 F (36.1 C) (Oral)   Ht 5\' 3"  (1.6 m)   Wt 138 lb (62.6 kg)   BMI 24.45 kg/m    Subjective:    Patient ID: [name], female    DOB: 12-08-1932, 84 y.o.   MRN: [id number]  HPI: [name] is a 84 y.o. female presenting on 12/23/2017 for stomach and rectum pain (on Cipro now for UTI and Diverticulitis)   HPI Stomach pain and constipation and rectal pain Patient is coming in with stomach pain and constipation and rectal pain.  She was in the hospital little bit ago for diverticulitis and was treated for that but finished the antibiotics and is no longer having diarrhea but is now having constipation.  Patient denies any fevers or chills.  She does say that she has some urinary retention since she is having constipation.  The constipation is something that she is had before as well and does admit that is been more chronic.  She does have a mild dementia and so most of this history is provided by her niece who is been talking with the family member as well.  Patient denies any rectal bleeding but just has a lot of pain around the hemorrhoids that are sticking out.  She had some significant pain around the rectum this morning with a bowel movement and that is what brings them in today.  Relevant past medical, surgical, family and social history reviewed and updated as indicated. Interim medical history since our last visit reviewed. Allergies and medications reviewed and updated.  Review of Systems  Constitutional: Negative for chills and fever.  Eyes: Negative for redness and visual disturbance.  Respiratory: Negative for chest tightness and shortness of breath.   Cardiovascular: Negative for chest pain and leg swelling.  Gastrointestinal: Positive for abdominal pain, constipation and rectal pain. Negative for abdominal distention, anal bleeding, blood in stool, diarrhea, nausea and vomiting.  Musculoskeletal: Negative for back pain and  gait problem.  Skin: Negative for rash.  Neurological: Negative for light-headedness and headaches.  Psychiatric/Behavioral: Negative for agitation and behavioral problems.  All other systems reviewed and are negative.   Per HPI unless specifically indicated above   Allergies as of 12/23/2017      Reactions   Penicillins Rash, Other (See Comments)   Has patient had a PCN reaction causing immediate rash, facial/tongue/throat swelling, SOB or lightheadedness with hypotension: No Has patient had a PCN reaction causing severe rash involving mucus membranes or skin necrosis: No Has patient had a PCN reaction that required hospitalization No Has patient had a PCN reaction occurring within the last 10 years: No If all of the above answers are "NO", then may proceed with Cephalosporin use.      Medication List       Accurate as of December 23, 2017  6:02 PM. Always use your most recent med list.        ARTIFICIAL TEAR OP Apply 2 drops to eye daily as needed (Dryness). To the left eye   atorvastatin 20 MG tablet Commonly known as:  LIPITOR Take 1 tablet (20 mg total) by mouth at bedtime.   cefdinir 300 MG capsule Commonly known as:  OMNICEF Take 1 capsule (300 mg total) by mouth 2 (two) times daily. 1 po BID   ciprofloxacin 500 MG tablet Commonly known as:  CIPRO Take 1 tablet (500 mg total) by mouth every 12 (twelve) hours.  digoxin 0.125 MG tablet Commonly known as:  LANOXIN Take 1 tablet (0.125 mg total) by mouth daily.   docusate 60 MG/15ML syrup Commonly known as:  COLACE Take 15 mLs (60 mg total) by mouth 2 (two) times daily.   donepezil 5 MG tablet Commonly known as:  ARICEPT Take 1 tablet (5 mg total) by mouth at bedtime. FOR MEMORY   Eluxadoline 75 MG Tabs Commonly known as:  VIBERZI Take 75 mg by mouth 2 (two) times daily.   flecainide 100 MG tablet Commonly known as:  TAMBOCOR 100 mg. Take 1/2 tablet 50 mg by mouth twice daily   hydrocortisone 25 MG  suppository Commonly known as:  ANUSOL-HC Place 1 suppository (25 mg total) rectally 2 (two) times daily.   irbesartan 150 MG tablet Commonly known as:  AVAPRO TAKE 1 TABLET EVERY DAY (Needs to see PCP)   levETIRAcetam 250 MG tablet Commonly known as:  KEPPRA Take 1 tablet (250 mg total) by mouth 2 (two) times daily.   levothyroxine 50 MCG tablet Commonly known as:  SYNTHROID, LEVOTHROID Take 1 tablet daily before breakfast   magnesium hydroxide 400 MG/5ML suspension Commonly known as:  MILK OF MAGNESIA Take 15 mLs by mouth daily as needed for mild constipation.   metoprolol succinate 100 MG 24 hr tablet Commonly known as:  TOPROL-XL Take 1 tablet (100 mg total) by mouth daily. Take with or immediately following a meal.   metroNIDAZOLE 500 MG tablet Commonly known as:  FLAGYL Take 1 tablet (500 mg total) by mouth 2 (two) times daily.   mirtazapine 15 MG tablet Commonly known as:  REMERON Take 1 tablet (15 mg total) by mouth at bedtime.   pantoprazole 40 MG tablet Commonly known as:  PROTONIX Take 1 tablet (40 mg total) by mouth daily.   polyethylene glycol powder powder Commonly known as:  GLYCOLAX/MIRALAX Take 17 g by mouth daily as needed.   potassium chloride 10 MEQ tablet Commonly known as:  K-DUR,KLOR-CON Take 1 tablet (10 mEq total) by mouth daily.   rifaximin 550 MG Tabs tablet Commonly known as:  XIFAXAN Take 1 tablet (550 mg total) by mouth 3 (three) times daily.   traMADol 50 MG tablet Commonly known as:  ULTRAM Take 1 tablet (50 mg total) by mouth every 12 (twelve) hours as needed.   TYLENOL 325 MG Caps Generic drug:  Acetaminophen Take 650 mg by mouth every 6 (six) hours as needed for mild pain.   Vilazodone HCl 10 MG Tabs Commonly known as:  VIIBRYD Take 1 tablet (10 mg total) by mouth daily.   warfarin 5 MG tablet Commonly known as:  COUMADIN Take as directed by the anticoagulation clinic. If you are unsure how to take this medication, talk  to your nurse or doctor. Original instructions:  Take 1 tablet (5 mg total) by mouth daily.          Objective:    BP 133/77   Pulse (!) 129   Temp (!) 97 F (36.1 C) (Oral)   Ht 5\' 3"  (1.6 m)   Wt 138 lb (62.6 kg)   BMI 24.45 kg/m   Wt Readings from Last 3 Encounters:  12/23/17 138 lb (62.6 kg)  12/21/17 136 lb (61.7 kg)  12/18/17 130 lb (59 kg)    Physical Exam Vitals signs and nursing note reviewed.  Constitutional:      General: She is not in acute distress.    Appearance: She is well-developed. She is not diaphoretic.  Eyes:     Conjunctiva/sclera: Conjunctivae normal.  Cardiovascular:     Rate and Rhythm: Normal rate and regular rhythm.     Heart sounds: Normal heart sounds. No murmur.  Pulmonary:     Effort: Pulmonary effort is normal. No respiratory distress.     Breath sounds: Normal breath sounds. No wheezing.  Abdominal:     General: Abdomen is flat. Bowel sounds are normal. There is no distension.     Palpations: Abdomen is soft.     Tenderness: There is abdominal tenderness (Diffuse mild tenderness, no rebound or guarding). There is no right CVA tenderness or left CVA tenderness.  Musculoskeletal: Normal range of motion.        General: No tenderness.  Skin:    General: Skin is warm and dry.     Findings: No rash.  Neurological:     Mental Status: She is alert and oriented to person, place, and time.     Coordination: Coordination normal.  Psychiatric:        Behavior: Behavior normal.         Assessment & Plan:   Problem List Items Addressed This Visit    None    Visit Diagnoses    Constipation, unspecified constipation type    -  Primary   Relevant Medications   magnesium hydroxide (MILK OF MAGNESIA) 400 MG/5ML suspension   polyethylene glycol powder (GLYCOLAX/MIRALAX) powder   docusate (COLACE) 60 MG/15ML syrup   Hemorrhoids, unspecified hemorrhoid type       Relevant Medications   hydrocortisone (ANUSOL-HC) 25 MG suppository        Recommended doing MiraLAX once a day and milk of magnesium once a day for 2 days and doing  Follow up plan: Return if symptoms worsen or fail to improve.  Counseling provided for all of the vaccine components No orders of the defined types were placed in this encounter.   Caryl Pina, MD [facility] 12/23/2017, 6:02 PM

## 2017-12-26 ENCOUNTER — Ambulatory Visit (INDEPENDENT_AMBULATORY_CARE_PROVIDER_SITE_OTHER): Payer: Medicare Other | Admitting: Pharmacist Clinician (PhC)/ Clinical Pharmacy Specialist

## 2017-12-26 DIAGNOSIS — Z7901 Long term (current) use of anticoagulants: Secondary | ICD-10-CM | POA: Diagnosis not present

## 2017-12-26 DIAGNOSIS — I480 Paroxysmal atrial fibrillation: Secondary | ICD-10-CM | POA: Diagnosis not present

## 2017-12-26 LAB — COAGUCHEK XS/INR WAIVED
INR: 1.6 — ABNORMAL HIGH (ref 0.9–1.1)
Prothrombin Time: 18.8 s

## 2017-12-26 NOTE — Patient Instructions (Signed)
Description   Change to taking 1 tablet of warfarin on Wednesdays and Saturdays and 1/2 tablet all other days of the week.  Today's INR is 1.6  (goal 2-3)  Too thick

## 2018-01-06 ENCOUNTER — Encounter: Payer: Self-pay | Admitting: Family Medicine

## 2018-01-06 ENCOUNTER — Ambulatory Visit (INDEPENDENT_AMBULATORY_CARE_PROVIDER_SITE_OTHER): Payer: Medicare Other | Admitting: Family Medicine

## 2018-01-06 VITALS — BP 142/88 | HR 106 | Temp 97.8°F | Ht 63.0 in | Wt 130.0 lb

## 2018-01-06 DIAGNOSIS — R350 Frequency of micturition: Secondary | ICD-10-CM

## 2018-01-06 DIAGNOSIS — Z87440 Personal history of urinary (tract) infections: Secondary | ICD-10-CM

## 2018-01-06 DIAGNOSIS — R300 Dysuria: Secondary | ICD-10-CM | POA: Diagnosis not present

## 2018-01-06 LAB — MICROSCOPIC EXAMINATION

## 2018-01-06 LAB — URINALYSIS, COMPLETE
Bilirubin, UA: NEGATIVE
Glucose, UA: NEGATIVE
Ketones, UA: NEGATIVE
Nitrite, UA: NEGATIVE
Protein, UA: NEGATIVE
Specific Gravity, UA: 1.025 (ref 1.005–1.030)
Urobilinogen, Ur: 0.2 mg/dL (ref 0.2–1.0)
pH, UA: 5.5 (ref 5.0–7.5)

## 2018-01-06 NOTE — Addendum Note (Signed)
Addended by: Michaela Corner on: 01/06/2018 03:07 PM   Modules accepted: Orders

## 2018-01-06 NOTE — Progress Notes (Signed)
Subjective:    Patient ID: [name], female    DOB: 04-13-1932, 85 y.o.   MRN: [id number]  Chief Complaint:  Urinary Tract Infection (urinary frequency, dysuria)   HPI: [name] is a 85 y.o. female presenting on 01/06/2018 for Urinary Tract Infection (urinary frequency, dysuria)  Pt presents today with complaints of dysuria, frequency, and urgency. Pt states this started yesterday. States she is unsure if she took the [facility] that was prescribed at her last visit. Pt denies fever, chills, fatigue, weakness, confusion, or abdominal pain. Denies vaginal symptoms. She states she does not drink enough water on a daily basis.   Relevant past medical, surgical, family, and social history reviewed and updated as indicated.  Allergies and medications reviewed and updated.   Past Medical History:  Diagnosis Date  . Anemia   . Atrial fibrillation (HCC)    Paroxysmal, Flecainide therapy  . Calf pain    September, 2012, at rest  . Carotid bruit    Doppler, December, 2009, no abnormality  . Cataract   . Diverticulosis   . GERD (gastroesophageal reflux disease)   . History of shingles   . HLD (hyperlipidemia)   . HTN (hypertension)   . Hypothyroidism   . Insomnia   . Osteoarthritis   . PONV (postoperative nausea and vomiting)   . Rectal bleeding 2001   diverticulosis and int hemorrhoids on 07/1999 and 02/2010 colonoscopies.  . Stroke ([place])   . Warfarin anticoagulation     Past Surgical History:  Procedure Laterality Date  . BIOPSY  09/29/2015   Procedure: BIOPSY;  Surgeon: Rogene Houston, MD;  [facility];  Service: Endoscopy;;  gastric  . CATARACT EXTRACTION W/PHACO Right 04/15/2014   Procedure: CATARACT EXTRACTION PHACO AND INTRAOCULAR LENS PLACEMENT (IOC);  Surgeon: Tonny Branch, MD;  Location: [facility] OR[facility];  Service: Ophthalmology;  Laterality: Right;  CDE: 13.30  . CATARACT EXTRACTION W/PHACO Left 05/13/2014   Procedure: CATARACT EXTRACTION PHACO AND INTRAOCULAR  LENS PLACEMENT LEFT EYE;  Surgeon: Tonny Branch, MD;  Location: [facility];  Service: Ophthalmology;  Laterality: Left;  CDE:13.00  . CHOLECYSTECTOMY N/A 01/15/2015   Procedure: LAPAROSCOPIC CHOLECYSTECTOMY;  Surgeon: Ralene Ok, MD;  Location: [place];  Service: General;  Laterality: N/A;  . COLONOSCOPY N/A 09/29/2015   Procedure: COLONOSCOPY;  Surgeon: Rogene Houston, MD;  [facility];  Service: Endoscopy;  Laterality: N/A;  . COLONOSCOPY    . ESOPHAGOGASTRODUODENOSCOPY N/A 01/13/2015   Procedure: ESOPHAGOGASTRODUODENOSCOPY (EGD);  Surgeon: Jerene Bears, MD;  Location[facility] ENDOSCOPY;  Service: Endoscopy;  Laterality: N/A;  . ESOPHAGOGASTRODUODENOSCOPY N/A 09/29/2015   Procedure: ESOPHAGOGASTRODUODENOSCOPY (EGD);  Surgeon: Rogene Houston, MD;  Location: [facility];  Service: Endoscopy;  Laterality: N/A;  12:15  . GALLBLADDER SURGERY    . LEFT HEART CATH AND CORONARY ANGIOGRAPHY N/A 06/22/2016   Procedure: Left Heart Cath and Coronary Angiography;  Surgeon: Troy Sine, MD;  Location: [place] CV LAB;  Service: Cardiovascular;  Laterality: N/A;  . SHOULDER OPEN ROTATOR CUFF REPAIR     bilateral  . TOTAL KNEE ARTHROPLASTY  05/26/10   right  . TOTAL KNEE ARTHROPLASTY  11/17/2010   Procedure: TOTAL KNEE ARTHROPLASTY;  Surgeon: Mauri Pole;  Location: [facility];  Service: Orthopedics;  Laterality: Left;  . TOTAL KNEE ARTHROPLASTY     Right  . TOTAL SHOULDER REPLACEMENT  01/2010   left    Social History   Socioeconomic History  .  Marital status: Married    Spouse name: Not on file  . Number of children: Not on file  . Years of education: Not on file  . Highest education level: Not on file  Occupational History  . Not on file  Social Needs  . Financial resource strain: Not on file  . Food insecurity:    Worry: Not on file    Inability: Not on file  . Transportation needs:    Medical: Not on file    Non-medical: Not on file  Tobacco Use  . Smoking status: Never Smoker   . Smokeless tobacco: Never Used  Substance and Sexual Activity  . Alcohol use: No  . Drug use: No  . Sexual activity: Never  Lifestyle  . Physical activity:    Days per week: Not on file    Minutes per session: Not on file  . Stress: Not on file  Relationships  . Social connections:    Talks on phone: Not on file    Gets together: Not on file    Attends religious service: Not on file    Active member of club or organization: Not on file    Attends meetings of clubs or organizations: Not on file    Relationship status: Not on file  . Intimate partner violence:    Fear of current or ex partner: Not on file    Emotionally abused: Not on file    Physically abused: Not on file    Forced sexual activity: Not on file  Other Topics Concern  . Not on file  Social History Narrative  . Not on file    Outpatient Encounter Medications as of 01/06/2018  Medication Sig  . Acetaminophen (TYLENOL) 325 MG CAPS Take 650 mg by mouth every 6 (six) hours as needed for mild pain.  Marland Kitchen ARTIFICIAL TEAR OP Apply 2 drops to eye daily as needed (Dryness). To the left eye  . atorvastatin (LIPITOR) 20 MG tablet Take 1 tablet (20 mg total) by mouth at bedtime.  . digoxin (LANOXIN) 0.125 MG tablet Take 1 tablet (0.125 mg total) by mouth daily.  Marland Kitchen docusate (COLACE) 60 MG/15ML syrup Take 15 mLs (60 mg total) by mouth 2 (two) times daily.  Marland Kitchen donepezil (ARICEPT) 5 MG tablet Take 1 tablet (5 mg total) by mouth at bedtime. FOR MEMORY  . Eluxadoline (VIBERZI) 75 MG TABS Take 75 mg by mouth 2 (two) times daily.  . flecainide (TAMBOCOR) 100 MG tablet 100 mg. Take 1/2 tablet 50 mg by mouth twice daily  . hydrocortisone (ANUSOL-HC) 25 MG suppository Place 1 suppository (25 mg total) rectally 2 (two) times daily.  . irbesartan (AVAPRO) 150 MG tablet TAKE 1 TABLET EVERY DAY (Needs to see PCP)  . levETIRAcetam (KEPPRA) 250 MG tablet Take 1 tablet (250 mg total) by mouth 2 (two) times daily.  Marland Kitchen levothyroxine (SYNTHROID,  LEVOTHROID) 50 MCG tablet Take 1 tablet daily before breakfast  . magnesium hydroxide (MILK OF MAGNESIA) 400 MG/5ML suspension Take 15 mLs by mouth daily as needed for mild constipation.  . metoprolol succinate (TOPROL-XL) 100 MG 24 hr tablet Take 1 tablet (100 mg total) by mouth daily. Take with or immediately following a meal.  . mirtazapine (REMERON) 15 MG tablet Take 1 tablet (15 mg total) by mouth at bedtime.  . pantoprazole (PROTONIX) 40 MG tablet Take 1 tablet (40 mg total) by mouth daily.  . polyethylene glycol powder (GLYCOLAX/MIRALAX) powder Take 17 g by mouth daily as needed.  Marland Kitchen  potassium chloride SA (K-DUR,KLOR-CON) 10 MEQ tablet Take 1 tablet (10 mEq total) by mouth daily.  . rifaximin (XIFAXAN) 550 MG TABS tablet Take 1 tablet (550 mg total) by mouth 3 (three) times daily.  . traMADol (ULTRAM) 50 MG tablet Take 1 tablet (50 mg total) by mouth every 12 (twelve) hours as needed.  . Vilazodone HCl (VIIBRYD) 10 MG TABS Take 1 tablet (10 mg total) by mouth daily.  Marland Kitchen warfarin (COUMADIN) 5 MG tablet Take 1 tablet (5 mg total) by mouth daily.  . [DISCONTINUED] cefdinir (OMNICEF) 300 MG capsule Take 1 capsule (300 mg total) by mouth 2 (two) times daily. 1 po BID (Patient not taking: Reported on 12/23/2017)  . [DISCONTINUED] ciprofloxacin (CIPRO) 500 MG tablet Take 1 tablet (500 mg total) by mouth every 12 (twelve) hours.  . [DISCONTINUED] metroNIDAZOLE (FLAGYL) 500 MG tablet Take 1 tablet (500 mg total) by mouth 2 (two) times daily.   No facility-administered encounter medications on file as of 01/06/2018.     Allergies  Allergen Reactions  . Penicillins Rash and Other (See Comments)    Has patient had a PCN reaction causing immediate rash, facial/tongue/throat swelling, SOB or lightheadedness with hypotension: No Has patient had a PCN reaction causing severe rash involving mucus membranes or skin necrosis: No Has patient had a PCN reaction that required hospitalization No Has patient had  a PCN reaction occurring within the last 10 years: No If all of the above answers are "NO", then may proceed with Cephalosporin use.    Review of Systems  Constitutional: Negative for chills, fatigue and fever.  Respiratory: Negative for chest tightness and shortness of breath.   Cardiovascular: Negative for chest pain and palpitations.  Genitourinary: Positive for dysuria, frequency and urgency. Negative for difficulty urinating, flank pain, hematuria, pelvic pain, vaginal bleeding, vaginal discharge and vaginal pain.  Musculoskeletal: Negative for back pain.  Neurological: Negative for weakness and headaches.  Psychiatric/Behavioral: Negative for confusion.  All other systems reviewed and are negative.       Objective:    BP (!) 142/88 (Cuff Size: Normal)   Pulse (!) 106   Temp 97.8 F (36.6 C) (Oral)   Ht 5\' 3"  (1.6 m)   Wt 130 lb (59 kg)   BMI 23.03 kg/m    Wt Readings from Last 3 Encounters:  01/06/18 130 lb (59 kg)  12/23/17 138 lb (62.6 kg)  12/21/17 136 lb (61.7 kg)    Physical Exam Vitals signs and nursing note reviewed.  Constitutional:      General: She is not in acute distress.    Appearance: Normal appearance. She is well-developed and well-groomed. She is not ill-appearing or toxic-appearing.  HENT:     Head: Normocephalic and atraumatic.     Mouth/Throat:     Mouth: Mucous membranes are moist.     Pharynx: Oropharynx is clear.  Eyes:     Conjunctiva/sclera: Conjunctivae normal.     Pupils: Pupils are equal, round, and reactive to light.  Neck:     Musculoskeletal: Normal range of motion and neck supple.  Cardiovascular:     Rate and Rhythm: Normal rate and regular rhythm.     Heart sounds: No murmur. No friction rub. No gallop.   Pulmonary:     Effort: Pulmonary effort is normal. No respiratory distress.     Breath sounds: Normal breath sounds.  Abdominal:     General: Bowel sounds are normal. There is no distension.     Palpations: Abdomen  is  soft.     Tenderness: There is no abdominal tenderness. There is no right CVA tenderness or left CVA tenderness.  Skin:    General: Skin is warm and dry.     Capillary Refill: Capillary refill takes less than 2 seconds.  Neurological:     General: No focal deficit present.     Mental Status: She is alert.  Psychiatric:        Mood and Affect: Mood normal.        Behavior: Behavior normal. Behavior is cooperative.        Thought Content: Thought content normal.        Judgment: Judgment normal.     Results for orders placed or performed in visit on 12/26/17  CoaguChek XS/INR Waived  Result Value Ref Range   INR 1.6 (H) 0.9 - 1.1   Prothrombin Time 18.8 sec     Urinalysis in office: 2+ blood, trace leukocytes, and few bacteria. Culture pending.   Pertinent labs & imaging results that were available during my care of the patient were reviewed by me and considered in my medical decision making.  Assessment & Plan:  [place] was seen today for urinary tract infection.  Diagnoses and all orders for this visit:  History of urinary tract infection -     Cancel: Urinalysis, Complete -     Urinalysis -     Cancel: Urine Culture -     Urine culture  Dysuria -     Urinalysis -     Cancel: Urine Culture -     Urine culture  Urinary frequency -     Urine culture   Increase water intake. Avoid bladder irritants. Will wait for culture results and treat accordingly.   Continue all other maintenance medications.  Follow up plan: Return in 4 weeks (on 02/03/2018).  Educational handout given for UTI  The above assessment and management plan was discussed with the patient. The patient verbalized understanding of and has agreed to the management plan. Patient is aware to call the clinic if symptoms persist or worsen. Patient is aware when to return to the clinic for a follow-up visit. Patient educated on when it is appropriate to go to the emergency department.   Monia Pouch,  FNP-C [facility] [phone]

## 2018-01-06 NOTE — Patient Instructions (Signed)

## 2018-01-08 LAB — URINE CULTURE

## 2018-01-09 ENCOUNTER — Ambulatory Visit: Payer: Medicare Other | Admitting: *Deleted

## 2018-01-09 ENCOUNTER — Ambulatory Visit (INDEPENDENT_AMBULATORY_CARE_PROVIDER_SITE_OTHER): Payer: Medicare Other

## 2018-01-09 ENCOUNTER — Other Ambulatory Visit: Payer: Self-pay | Admitting: Family Medicine

## 2018-01-09 DIAGNOSIS — I4819 Other persistent atrial fibrillation: Secondary | ICD-10-CM

## 2018-01-09 DIAGNOSIS — N3001 Acute cystitis with hematuria: Secondary | ICD-10-CM

## 2018-01-09 DIAGNOSIS — I480 Paroxysmal atrial fibrillation: Secondary | ICD-10-CM

## 2018-01-09 MED ORDER — CEPHALEXIN 500 MG PO CAPS: 500.0000 mg | ORAL_CAPSULE | Freq: Four times a day (QID) | ORAL | 0 refills | Status: AC

## 2018-01-09 NOTE — Progress Notes (Signed)
holter monitor placed

## 2018-01-10 ENCOUNTER — Ambulatory Visit: Payer: Self-pay | Admitting: Licensed Clinical Social Worker

## 2018-01-10 NOTE — Chronic Care Management (AMB) (Signed)
  Chronic Care Management   Social Work Note  01/10/2018 Name: [name] MRN: [id number] DOB: Nov 14, 1932   Telephone outreach to [name] today to assess psychosocial needs of client at present.   [name] was given information about Chronic Care Management services today including:  1. CCM service includes personalized support from designated clinical staff supervised by her physician, including individualized plan of care and coordination with other care providers 2. 24/7 contact phone numbers for assistance for urgent and routine care needs. 3. Service will only be billed when office clinical staff spend 20 minutes or more in a month to coordinate care. 4. Only one practitioner may furnish and bill the service in a calendar month. 5. The patient may stop CCM services at any time (effective at the end of the month) by phone call to the office staff. 6. The patient will be responsible for cost sharing (co-pay) of up to 20% of the service fee (after annual deductible is met).  Patient did not agree to services but expressed appreciation for the information and expressed interest. She would like to consider and agreed to a follow up call by LCSW.   Plan: LCSW will reach out to [name] by phone as requested over the next 3 week period.   Norva Riffle.Axyl Sitzman MSW, LCSW Licensed Clinical Social Worker [facility] Care Management [phone]

## 2018-01-10 NOTE — Patient Instructions (Signed)
[name] was given information about Chronic Care Management services today including:  1. CCM service includes personalized support from designated clinical staff supervised by her physician, including individualized plan of care and coordination with other care providers 2. 24/7 contact phone numbers for assistance for urgent and routine care needs. 3. Service will only be billed when office clinical staff spend 20 minutes or more in a month to coordinate care. 4. Only one practitioner may furnish and bill the service in a calendar month. 5. The patient may stop CCM services at any time (effective at the end of the month) by phone call to the office staff. 6. The patient will be responsible for cost sharing (co-pay) of up to 20% of the service fee (after annual deductible is met).  Patient has not agreed to enrollment in CCM services but is considering information.   Please call me if I can answer any questions or provide help to you.   Norva Riffle.Abem Shaddix MSW, LCSW Licensed Clinical Social Worker [facility] Care Management [phone]

## 2018-01-19 ENCOUNTER — Telehealth: Payer: Self-pay | Admitting: Cardiology

## 2018-01-19 ENCOUNTER — Encounter: Payer: Self-pay | Admitting: Family Medicine

## 2018-01-19 ENCOUNTER — Ambulatory Visit (INDEPENDENT_AMBULATORY_CARE_PROVIDER_SITE_OTHER): Payer: Medicare Other | Admitting: Family Medicine

## 2018-01-19 VITALS — BP 157/100 | HR 102 | Temp 97.1°F | Ht 63.0 in | Wt 130.0 lb

## 2018-01-19 DIAGNOSIS — I480 Paroxysmal atrial fibrillation: Secondary | ICD-10-CM

## 2018-01-19 DIAGNOSIS — Z7901 Long term (current) use of anticoagulants: Secondary | ICD-10-CM

## 2018-01-19 LAB — COAGUCHEK XS/INR WAIVED
INR: 1.2 — ABNORMAL HIGH (ref 0.9–1.1)
Prothrombin Time: 14.7 s

## 2018-01-19 NOTE — Telephone Encounter (Signed)
Would like results from monitor

## 2018-01-19 NOTE — Progress Notes (Signed)
BP (!) 157/100   Pulse (!) 102   Temp (!) 97.1 F (36.2 C) (Oral)   Ht 5\' 3"  (1.6 m)   Wt 130 lb (59 kg)   BMI 23.03 kg/m    Subjective:    Patient ID: [name], female    DOB: [birth year], 85 y.o.   MRN: [id number]  HPI: [name] is a 85 y.o. female presenting on 01/19/2018 for Anticoagulation   HPI Coumadin anticoagulation recheck Patient is coming in for anticoagulation and Coumadin recheck.  She was here last time 4 weeks ago and her INR was 1.6 and we had increased her dose and now it has gone down to 1.2.  She does have dementia but her son says that he does not know if her diet has changed that he knows of.  He thinks she may have had some more increased confusion and speech changes yesterday but did not have any of the other signs of a stroke and that is all come back to normal today.  She also has dementia so it distorts the picture a little bit.  Relevant past medical, surgical, family and social history reviewed and updated as indicated. Interim medical history since our last visit reviewed. Allergies and medications reviewed and updated.  Review of Systems  Constitutional: Negative for chills and fever.  Eyes: Negative for visual disturbance.  Respiratory: Negative for chest tightness and shortness of breath.   Cardiovascular: Negative for chest pain and leg swelling.  Musculoskeletal: Negative for back pain and gait problem.  Skin: Negative for rash.  Neurological: Negative for light-headedness and headaches.  Psychiatric/Behavioral: Negative for agitation and behavioral problems.  All other systems reviewed and are negative.   Per HPI unless specifically indicated above      Objective:    BP (!) 157/100   Pulse (!) 102   Temp (!) 97.1 F (36.2 C) (Oral)   Ht 5\' 3"  (1.6 m)   Wt 130 lb (59 kg)   BMI 23.03 kg/m   Wt Readings from Last 3 Encounters:  01/19/18 130 lb (59 kg)  01/06/18 130 lb (59 kg)  12/23/17 138 lb (62.6 kg)    Physical  Exam Vitals signs and nursing note reviewed.  Constitutional:      General: She is not in acute distress.    Appearance: She is well-developed. She is not diaphoretic.  Eyes:     Conjunctiva/sclera: Conjunctivae normal.  Cardiovascular:     Rate and Rhythm: Normal rate and regular rhythm.     Heart sounds: Normal heart sounds. No murmur.  Pulmonary:     Effort: Pulmonary effort is normal. No respiratory distress.     Breath sounds: Normal breath sounds. No wheezing.  Musculoskeletal: Normal range of motion.        General: No tenderness.  Skin:    General: Skin is warm and dry.     Findings: No rash.  Neurological:     Mental Status: She is alert and oriented to person, place, and time.     Coordination: Coordination normal.  Psychiatric:        Behavior: Behavior normal.         Assessment & Plan:   Problem List Items Addressed This Visit      Cardiovascular and Mediastinum   Paroxysmal atrial fibrillation (HCC)     Other   Warfarin anticoagulation - Primary   Relevant Orders   CoaguChek XS/INR Waived       Follow up  plan: Return in about 2 weeks (around 02/02/2018), or if symptoms worsen or fail to improve.  Counseling provided for all of the vaccine components Orders Placed This Encounter  Procedures  . CoaguChek XS/INR [place], MD [facility] 01/19/2018, 2:44 PM

## 2018-01-20 NOTE — Telephone Encounter (Signed)
Pt notified that results have been unloaded and office will call when results are available

## 2018-01-27 ENCOUNTER — Telehealth: Payer: Self-pay | Admitting: Cardiology

## 2018-01-27 NOTE — Telephone Encounter (Signed)
Called pt. No answer, left message for pt to return call.  

## 2018-01-27 NOTE — Telephone Encounter (Signed)
Calling for results of heart monitor / tg

## 2018-01-30 ENCOUNTER — Observation Stay (HOSPITAL_COMMUNITY): Payer: Medicare Other

## 2018-01-30 ENCOUNTER — Observation Stay (HOSPITAL_COMMUNITY)
Admission: EM | Admit: 2018-01-30 | Discharge: 2018-02-03 | Disposition: A | Discharge status: Home / Self Care | Payer: Medicare Other | Attending: Internal Medicine | Admitting: Internal Medicine

## 2018-01-30 ENCOUNTER — Emergency Department (HOSPITAL_COMMUNITY): Payer: Medicare Other

## 2018-01-30 ENCOUNTER — Encounter (HOSPITAL_COMMUNITY): Payer: Self-pay

## 2018-01-30 ENCOUNTER — Other Ambulatory Visit: Payer: Self-pay

## 2018-01-30 DIAGNOSIS — I313 Pericardial effusion (noninflammatory): Secondary | ICD-10-CM | POA: Insufficient documentation

## 2018-01-30 DIAGNOSIS — I110 Hypertensive heart disease with heart failure: Secondary | ICD-10-CM | POA: Diagnosis not present

## 2018-01-30 DIAGNOSIS — R2681 Unsteadiness on feet: Secondary | ICD-10-CM | POA: Insufficient documentation

## 2018-01-30 DIAGNOSIS — Z96653 Presence of artificial knee joint, bilateral: Secondary | ICD-10-CM | POA: Insufficient documentation

## 2018-01-30 DIAGNOSIS — I2510 Atherosclerotic heart disease of native coronary artery without angina pectoris: Secondary | ICD-10-CM | POA: Insufficient documentation

## 2018-01-30 DIAGNOSIS — E871 Hypo-osmolality and hyponatremia: Secondary | ICD-10-CM | POA: Diagnosis not present

## 2018-01-30 DIAGNOSIS — I252 Old myocardial infarction: Secondary | ICD-10-CM | POA: Insufficient documentation

## 2018-01-30 DIAGNOSIS — F0390 Unspecified dementia without behavioral disturbance: Secondary | ICD-10-CM | POA: Insufficient documentation

## 2018-01-30 DIAGNOSIS — I10 Essential (primary) hypertension: Secondary | ICD-10-CM | POA: Diagnosis present

## 2018-01-30 DIAGNOSIS — R404 Transient alteration of awareness: Secondary | ICD-10-CM | POA: Diagnosis not present

## 2018-01-30 DIAGNOSIS — Z8249 Family history of ischemic heart disease and other diseases of the circulatory system: Secondary | ICD-10-CM | POA: Insufficient documentation

## 2018-01-30 DIAGNOSIS — F4489 Other dissociative and conversion disorders: Secondary | ICD-10-CM

## 2018-01-30 DIAGNOSIS — G9340 Encephalopathy, unspecified: Secondary | ICD-10-CM | POA: Diagnosis not present

## 2018-01-30 DIAGNOSIS — G319 Degenerative disease of nervous system, unspecified: Secondary | ICD-10-CM | POA: Insufficient documentation

## 2018-01-30 DIAGNOSIS — R0689 Other abnormalities of breathing: Secondary | ICD-10-CM | POA: Diagnosis not present

## 2018-01-30 DIAGNOSIS — Z79899 Other long term (current) drug therapy: Secondary | ICD-10-CM | POA: Insufficient documentation

## 2018-01-30 DIAGNOSIS — Z7901 Long term (current) use of anticoagulants: Secondary | ICD-10-CM

## 2018-01-30 DIAGNOSIS — R000 Tachycardia, unspecified: Secondary | ICD-10-CM | POA: Diagnosis not present

## 2018-01-30 DIAGNOSIS — I5032 Chronic diastolic (congestive) heart failure: Secondary | ICD-10-CM | POA: Diagnosis not present

## 2018-01-30 DIAGNOSIS — I639 Cerebral infarction, unspecified: Secondary | ICD-10-CM | POA: Diagnosis not present

## 2018-01-30 DIAGNOSIS — E785 Hyperlipidemia, unspecified: Secondary | ICD-10-CM | POA: Diagnosis not present

## 2018-01-30 DIAGNOSIS — G894 Chronic pain syndrome: Secondary | ICD-10-CM | POA: Diagnosis not present

## 2018-01-30 DIAGNOSIS — K219 Gastro-esophageal reflux disease without esophagitis: Secondary | ICD-10-CM | POA: Insufficient documentation

## 2018-01-30 DIAGNOSIS — R410 Disorientation, unspecified: Secondary | ICD-10-CM | POA: Diagnosis not present

## 2018-01-30 DIAGNOSIS — I444 Left anterior fascicular block: Secondary | ICD-10-CM | POA: Diagnosis not present

## 2018-01-30 DIAGNOSIS — R9082 White matter disease, unspecified: Secondary | ICD-10-CM | POA: Insufficient documentation

## 2018-01-30 DIAGNOSIS — E039 Hypothyroidism, unspecified: Secondary | ICD-10-CM | POA: Diagnosis not present

## 2018-01-30 DIAGNOSIS — G40909 Epilepsy, unspecified, not intractable, without status epilepticus: Secondary | ICD-10-CM | POA: Diagnosis not present

## 2018-01-30 DIAGNOSIS — I4892 Unspecified atrial flutter: Secondary | ICD-10-CM | POA: Diagnosis not present

## 2018-01-30 DIAGNOSIS — R4701 Aphasia: Secondary | ICD-10-CM

## 2018-01-30 DIAGNOSIS — Z79890 Hormone replacement therapy (postmenopausal): Secondary | ICD-10-CM | POA: Insufficient documentation

## 2018-01-30 DIAGNOSIS — Z96612 Presence of left artificial shoulder joint: Secondary | ICD-10-CM | POA: Insufficient documentation

## 2018-01-30 DIAGNOSIS — I4891 Unspecified atrial fibrillation: Secondary | ICD-10-CM | POA: Diagnosis not present

## 2018-01-30 DIAGNOSIS — G459 Transient cerebral ischemic attack, unspecified: Secondary | ICD-10-CM | POA: Diagnosis not present

## 2018-01-30 DIAGNOSIS — G4700 Insomnia, unspecified: Secondary | ICD-10-CM | POA: Diagnosis not present

## 2018-01-30 DIAGNOSIS — M810 Age-related osteoporosis without current pathological fracture: Secondary | ICD-10-CM | POA: Diagnosis present

## 2018-01-30 DIAGNOSIS — Z96659 Presence of unspecified artificial knee joint: Secondary | ICD-10-CM

## 2018-01-30 DIAGNOSIS — I480 Paroxysmal atrial fibrillation: Secondary | ICD-10-CM | POA: Diagnosis not present

## 2018-01-30 DIAGNOSIS — I080 Rheumatic disorders of both mitral and aortic valves: Secondary | ICD-10-CM | POA: Insufficient documentation

## 2018-01-30 DIAGNOSIS — Z880 Allergy status to penicillin: Secondary | ICD-10-CM | POA: Insufficient documentation

## 2018-01-30 DIAGNOSIS — F03A0 Unspecified dementia, mild, without behavioral disturbance, psychotic disturbance, mood disturbance, and anxiety: Secondary | ICD-10-CM | POA: Diagnosis present

## 2018-01-30 DIAGNOSIS — I6523 Occlusion and stenosis of bilateral carotid arteries: Secondary | ICD-10-CM | POA: Diagnosis not present

## 2018-01-30 DIAGNOSIS — Z8673 Personal history of transient ischemic attack (TIA), and cerebral infarction without residual deficits: Secondary | ICD-10-CM

## 2018-01-30 DIAGNOSIS — F801 Expressive language disorder: Secondary | ICD-10-CM | POA: Diagnosis not present

## 2018-01-30 LAB — APTT: aPTT: 37 seconds — ABNORMAL HIGH (ref 24–36)

## 2018-01-30 LAB — ETHANOL: Alcohol, Ethyl (B): <10 mg/dL (ref <10)

## 2018-01-30 LAB — COMPREHENSIVE METABOLIC PANEL
ALT: 18 U/L (ref 0–44)
ANION GAP: 12 (ref 5–15)
AST: 28 U/L (ref 15–41)
Albumin: 3.8 g/dL (ref 3.5–5.0)
Alkaline Phosphatase: 54 U/L (ref 38–126)
BUN: 10 mg/dL (ref 8–23)
CO2: 25 mmol/L (ref 22–32)
Calcium: 8.5 mg/dL — ABNORMAL LOW (ref 8.9–10.3)
Chloride: 97 mmol/L — ABNORMAL LOW (ref 98–111)
Creatinine, Ser: 0.59 mg/dL (ref 0.44–1.00)
GFR calc Af Amer: >60 mL/min (ref >60)
GFR calc non Af Amer: >60 mL/min (ref >60)
Glucose, Bld: 115 mg/dL — ABNORMAL HIGH (ref 70–99)
Potassium: 4.2 mmol/L (ref 3.5–5.1)
Sodium: 134 mmol/L — ABNORMAL LOW (ref 135–145)
TOTAL PROTEIN: 7.2 g/dL (ref 6.5–8.1)
Total Bilirubin: 1 mg/dL (ref 0.3–1.2)

## 2018-01-30 LAB — DIFFERENTIAL
Abs Immature Granulocytes: 0.05 10*3/uL (ref 0.00–0.07)
Basophils Absolute: 0 10*3/uL (ref 0.0–0.1)
Basophils Relative: 1 %
EOS ABS: 0 10*3/uL (ref 0.0–0.5)
Eosinophils Relative: 0 %
Immature Granulocytes: 1 %
Lymphocytes Relative: 20 %
Lymphs Abs: 1.5 10*3/uL (ref 0.7–4.0)
Monocytes Absolute: 0.7 10*3/uL (ref 0.1–1.0)
Monocytes Relative: 9 %
Neutro Abs: 5.5 10*3/uL (ref 1.7–7.7)
Neutrophils Relative %: 69 %

## 2018-01-30 LAB — URINALYSIS, ROUTINE W REFLEX MICROSCOPIC
Bilirubin Urine: NEGATIVE
Glucose, UA: NEGATIVE mg/dL
Ketones, ur: 5 mg/dL — AB
Leukocytes, UA: NEGATIVE
Nitrite: NEGATIVE
Protein, ur: NEGATIVE mg/dL
Specific Gravity, Urine: 1.01 (ref 1.005–1.030)
pH: 8 (ref 5.0–8.0)

## 2018-01-30 LAB — CBC
HCT: 44 % (ref 36.0–46.0)
Hemoglobin: 14 g/dL (ref 12.0–15.0)
MCH: 30.3 pg (ref 26.0–34.0)
MCHC: 31.8 g/dL (ref 30.0–36.0)
MCV: 95.2 fL (ref 80.0–100.0)
PLATELETS: 301 10*3/uL (ref 150–400)
RBC: 4.62 MIL/uL (ref 3.87–5.11)
RDW: 14.4 % (ref 11.5–15.5)
WBC: 7.8 10*3/uL (ref 4.0–10.5)
nRBC: 0 % (ref 0.0–0.2)

## 2018-01-30 LAB — TROPONIN I: Troponin I: <0.03 ng/mL (ref <0.03)

## 2018-01-30 LAB — RAPID URINE DRUG SCREEN, HOSP PERFORMED
Amphetamines: NOT DETECTED
Barbiturates: NOT DETECTED
Benzodiazepines: NOT DETECTED
Cocaine: NOT DETECTED
Opiates: NOT DETECTED
Tetrahydrocannabinol: NOT DETECTED

## 2018-01-30 LAB — TSH: TSH: 3.101 u[IU]/mL (ref 0.350–4.500)

## 2018-01-30 LAB — DIGOXIN LEVEL: Digoxin Level: 0.8 ng/mL (ref 0.8–2.0)

## 2018-01-30 LAB — PROTIME-INR
INR: 1.6
PROTHROMBIN TIME: 18.9 s — AB (ref 11.4–15.2)

## 2018-01-30 LAB — AMMONIA: Ammonia: 13 umol/L (ref 9–35)

## 2018-01-30 MED ORDER — TRAMADOL HCL 50 MG PO TABS: 50.0000 mg | ORAL_TABLET | Freq: Four times a day (QID) | ORAL | Status: DC | PRN

## 2018-01-30 MED ORDER — SODIUM CHLORIDE (PF) 0.9 % IJ SOLN
INTRAMUSCULAR | Status: AC
  Filled 2018-01-30: qty 50

## 2018-01-30 MED ORDER — ATORVASTATIN CALCIUM 20 MG PO TABS
20.0000 mg | ORAL_TABLET | Freq: Every day | ORAL | Status: DC
  Administered 2018-01-30 – 2018-02-02 (×4): 20 mg via ORAL
  Filled 2018-01-30 (×4): qty 1

## 2018-01-30 MED ORDER — ACETAMINOPHEN 160 MG/5ML PO SOLN: 650.0000 mg | ORAL | Status: DC | PRN

## 2018-01-30 MED ORDER — MAGNESIUM HYDROXIDE 400 MG/5ML PO SUSP: 15.0000 mL | Freq: Every day | ORAL | Status: DC | PRN

## 2018-01-30 MED ORDER — SODIUM CHLORIDE 0.9 % IV SOLN
INTRAVENOUS | Status: AC
  Administered 2018-01-30: 21:00:00 via INTRAVENOUS

## 2018-01-30 MED ORDER — POLYETHYLENE GLYCOL 3350 17 G PO PACK: 17.0000 g | PACK | Freq: Every day | ORAL | Status: DC | PRN

## 2018-01-30 MED ORDER — ACETAMINOPHEN 650 MG RE SUPP: 650.0000 mg | RECTAL | Status: DC | PRN

## 2018-01-30 MED ORDER — DONEPEZIL HCL 5 MG PO TABS
5.0000 mg | ORAL_TABLET | Freq: Every day | ORAL | Status: DC
  Administered 2018-01-30 – 2018-02-02 (×4): 5 mg via ORAL
  Filled 2018-01-30 (×4): qty 1

## 2018-01-30 MED ORDER — STROKE: EARLY STAGES OF RECOVERY BOOK: Freq: Once | Status: DC

## 2018-01-30 MED ORDER — LEVETIRACETAM 250 MG PO TABS
250.0000 mg | ORAL_TABLET | Freq: Two times a day (BID) | ORAL | Status: DC
  Administered 2018-01-30 – 2018-02-01 (×4): 250 mg via ORAL
  Filled 2018-01-30 (×4): qty 1

## 2018-01-30 MED ORDER — WARFARIN - PHYSICIAN DOSING INPATIENT: Freq: Every day | Status: DC

## 2018-01-30 MED ORDER — PANTOPRAZOLE SODIUM 40 MG PO TBEC
40.0000 mg | DELAYED_RELEASE_TABLET | Freq: Every day | ORAL | Status: DC
  Administered 2018-01-31 – 2018-02-03 (×4): 40 mg via ORAL
  Filled 2018-01-30 (×5): qty 1

## 2018-01-30 MED ORDER — WARFARIN SODIUM 5 MG PO TABS
5.0000 mg | ORAL_TABLET | ORAL | Status: DC
  Administered 2018-01-30: 5 mg via ORAL
  Filled 2018-01-30: qty 1

## 2018-01-30 MED ORDER — POLYETHYLENE GLYCOL 3350 17 GM/SCOOP PO POWD
17.0000 g | Freq: Every day | ORAL | Status: DC | PRN
  Filled 2018-01-30: qty 255

## 2018-01-30 MED ORDER — ACETAMINOPHEN 325 MG PO TABS: 650.0000 mg | ORAL_TABLET | ORAL | Status: DC | PRN

## 2018-01-30 MED ORDER — MIRTAZAPINE 15 MG PO TABS
15.0000 mg | ORAL_TABLET | Freq: Every day | ORAL | Status: DC
  Administered 2018-01-30 – 2018-02-02 (×4): 15 mg via ORAL
  Filled 2018-01-30 (×4): qty 1

## 2018-01-30 MED ORDER — DIGOXIN 125 MCG PO TABS
0.1250 mg | ORAL_TABLET | Freq: Every day | ORAL | Status: DC
  Administered 2018-01-30 – 2018-02-03 (×5): 0.125 mg via ORAL
  Filled 2018-01-30 (×5): qty 1

## 2018-01-30 MED ORDER — WARFARIN SODIUM 5 MG PO TABS: 5.0000 mg | ORAL_TABLET | Freq: Every day | ORAL | Status: DC

## 2018-01-30 MED ORDER — FLECAINIDE ACETATE 50 MG PO TABS
50.0000 mg | ORAL_TABLET | Freq: Two times a day (BID) | ORAL | Status: DC
  Administered 2018-01-30 – 2018-02-03 (×8): 50 mg via ORAL
  Filled 2018-01-30 (×9): qty 1

## 2018-01-30 MED ORDER — LEVOTHYROXINE SODIUM 50 MCG PO TABS
50.0000 ug | ORAL_TABLET | Freq: Every day | ORAL | Status: DC
  Administered 2018-01-31 – 2018-02-03 (×4): 50 ug via ORAL
  Filled 2018-01-30 (×4): qty 1

## 2018-01-30 MED ORDER — IOPAMIDOL (ISOVUE-370) INJECTION 76%
100.0000 mL | Freq: Once | INTRAVENOUS | Status: AC | PRN
  Administered 2018-01-30: 80 mL via INTRAVENOUS

## 2018-01-30 MED ORDER — IOPAMIDOL (ISOVUE-370) INJECTION 76%
INTRAVENOUS | Status: AC
  Filled 2018-01-30: qty 100

## 2018-01-30 MED ORDER — WARFARIN SODIUM 2.5 MG PO TABS: 2.5000 mg | ORAL_TABLET | ORAL | Status: DC

## 2018-01-30 MED ORDER — DOCUSATE SODIUM 100 MG PO CAPS
100.0000 mg | ORAL_CAPSULE | Freq: Every day | ORAL | Status: DC
  Administered 2018-02-01 – 2018-02-03 (×3): 100 mg via ORAL
  Filled 2018-01-30 (×5): qty 1

## 2018-01-30 MED ORDER — PROCHLORPERAZINE EDISYLATE 10 MG/2ML IJ SOLN
5.0000 mg | Freq: Four times a day (QID) | INTRAMUSCULAR | Status: DC | PRN
  Administered 2018-01-31: 5 mg via INTRAVENOUS
  Filled 2018-01-30: qty 2

## 2018-01-30 NOTE — ED Provider Notes (Signed)
[facility] DEPT Provider Note   CSN: [id number] Arrival date & time: 01/30/18  1055     History   Chief Complaint Chief Complaint  Patient presents with  . Altered Mental Status    HPI [name] is a 85 y.o. female.  HPI Patient was brought in by EMS with patient and EMS report being the first history.  Patient history appears unreliable.  Reportedly at "breakfast" patient did not seem like herself and was more confused having trouble eating and drinking.  At baseline patient has dementia however is not clarified as to what that level of function is.  As a historian, patient could not really communicate what the problem was.  She seemed to have expressive aphasia or dementia.  She would respond to certain questions appropriately but others did not make sense.  Patient's son arrived after initial evaluation and clarified that it was his aunt that lives with the patient and thus reported the mental status change.  He reports he has spoken to her in the evening previously and she was at her normal baseline.  He reports her normal baseline is with some problems with recall but generally being aware of everyone and situationally appropriate.  He estimated breakfast to be around 9 AM but indicated that the last seen well was actually the night before.  As far as he is aware she had the symptoms immediately upon interacting with his aunt.  No known recent illness.  Patient is anticoagulated for A. fib. Past Medical History:  Diagnosis Date  . Anemia   . Atrial fibrillation (HCC)    Paroxysmal, Flecainide therapy  . Calf pain    September, 2012, at rest  . Carotid bruit    Doppler, December, 2009, no abnormality  . Cataract   . Diverticulosis   . GERD (gastroesophageal reflux disease)   . History of shingles   . HLD (hyperlipidemia)   . HTN (hypertension)   . Hypothyroidism   . Insomnia   . Osteoarthritis   . PONV (postoperative nausea and vomiting)    . Rectal bleeding 2001   diverticulosis and int hemorrhoids on 07/1999 and 02/2010 colonoscopies.  . Stroke ([place])   . Warfarin anticoagulation     Patient Active Problem List   Diagnosis Date Noted  . History of Clostridioides difficile colitis 12/23/2017  . Mild dementia ([place]) 12/23/2017  . History of TIA (transient ischemic attack) 03/15/2017  . Osteoporosis 02/24/2017  . Prolonged QT interval 01/28/2017  . Unstable angina ([place]) 06/16/2016  . Atrial flutter ([place]) 06/16/2016  . Overactive bladder 05/02/2015  . History of CVA (cerebrovascular accident) 04/21/2015  . History of dysarthria 02/20/2015  . HTN (hypertension) 01/11/2015  . Hyponatremia 01/11/2015  . Chronic pain syndrome 11/25/2014  . Insomnia 11/25/2014  . Chronic diastolic CHF (congestive heart failure) ([place]) 11/12/2014  . Paroxysmal atrial fibrillation ([place]) 10/14/2014  . Warfarin anticoagulation   . Hypothyroidism   . Carotid bruit   . S/P knee replacement   . DIVERTICULOSIS-COLON 01/19/2010    Past Surgical History:  Procedure Laterality Date  . BIOPSY  09/29/2015   Procedure: BIOPSY;  Surgeon: Rogene Houston, MD;  [facility];  Service: Endoscopy;;  gastric  . CATARACT EXTRACTION W/PHACO Right 04/15/2014   Procedure: CATARACT EXTRACTION PHACO AND INTRAOCULAR LENS PLACEMENT (IOC);  Surgeon: Tonny Branch, MD;  Location: [facility] ORS;  Service: Ophthalmology;  Laterality: Right;  CDE: 13.30  . CATARACT EXTRACTION W/PHACO Left 05/13/2014   Procedure:  CATARACT EXTRACTION PHACO AND INTRAOCULAR LENS PLACEMENT LEFT EYE;  Surgeon: Tonny Branch, MD;  Location: [facility];  Service: Ophthalmology;  Laterality: Left;  CDE:13.00  . CHOLECYSTECTOMY N/A 01/15/2015   Procedure: LAPAROSCOPIC CHOLECYSTECTOMY;  Surgeon: Ralene Ok, MD;  Location: [facility];  Service: General;  Laterality: N/A;  . COLONOSCOPY N/A 09/29/2015   Procedure: COLONOSCOPY;  Surgeon: Rogene Houston, MD;  [facility];  Service: Endoscopy;   Laterality: N/A;  . COLONOSCOPY    . ESOPHAGOGASTRODUODENOSCOPY N/A 01/13/2015   Procedure: ESOPHAGOGASTRODUODENOSCOPY (EGD);  Surgeon: Jerene Bears, MD;  Location: [facility] ENDOSCOPY;  Service: Endoscopy;  Laterality: N/A;  . ESOPHAGOGASTRODUODENOSCOPY N/A 09/29/2015   Procedure: ESOPHAGOGASTRODUODENOSCOPY (EGD);  Surgeon: Rogene Houston, MD;  [facility];  Service: Endoscopy;  Laterality: N/A;  12:15  . GALLBLADDER SURGERY    . LEFT HEART CATH AND CORONARY ANGIOGRAPHY N/A 06/22/2016   Procedure: Left Heart Cath and Coronary Angiography;  Surgeon: Troy Sine, MD;  Location: [facility] LAB;  Service: Cardiovascular;  Laterality: N/A;  . SHOULDER OPEN ROTATOR CUFF REPAIR     bilateral  . TOTAL KNEE ARTHROPLASTY  05/26/10   right  . TOTAL KNEE ARTHROPLASTY  11/17/2010   Procedure: TOTAL KNEE ARTHROPLASTY;  Surgeon: Mauri Pole;  Location: [facility];  Service: Orthopedics;  Laterality: Left;  . TOTAL KNEE ARTHROPLASTY     Right  . TOTAL SHOULDER REPLACEMENT  01/2010   left     OB History    Gravida  1   Para  1   Term  1   Preterm      AB      Living        SAB      TAB      Ectopic      Multiple      Live Births               Home Medications    Prior to Admission medications   Medication Sig Start Date End Date Taking? Authorizing Provider  atorvastatin (LIPITOR) 20 MG tablet Take 1 tablet (20 mg total) by mouth at bedtime. 11/09/17  Yes Dettinger, Fransisca Kaufmann, MD  cephALEXin (KEFLEX) 500 MG capsule Take 500 mg by mouth 4 (four) times daily. For 7 days   Yes [provider]  digoxin (LANOXIN) 0.125 MG tablet Take 1 tablet (0.125 mg total) by mouth daily. 12/21/17  Yes Minus Breeding, MD  docusate sodium (COLACE) 50 MG capsule Take 100 mg by mouth daily.   Yes [provider]  donepezil (ARICEPT) 5 MG tablet Take 1 tablet (5 mg total) by mouth at bedtime. FOR MEMORY 10/18/17  Yes Dettinger, Fransisca Kaufmann, MD  flecainide (TAMBOCOR) 100 MG  tablet 100 mg. Take 1/2 tablet 50 mg by mouth twice daily   Yes [provider]  irbesartan (AVAPRO) 150 MG tablet TAKE 1 TABLET EVERY DAY (Needs to see PCP) Patient taking differently: Take 150 mg by mouth daily.  10/10/17  Yes Dettinger, Fransisca Kaufmann, MD  levETIRAcetam (KEPPRA) 250 MG tablet Take 1 tablet (250 mg total) by mouth 2 (two) times daily. 11/09/17  Yes Dettinger, Fransisca Kaufmann, MD  levothyroxine (SYNTHROID, LEVOTHROID) 50 MCG tablet Take 1 tablet daily before breakfast 11/09/17  Yes Dettinger, Fransisca Kaufmann, MD  metoprolol succinate (TOPROL-XL) 100 MG 24 hr tablet Take 1 tablet (100 mg total) by mouth daily. Take with or immediately following a meal. 11/09/17  Yes Dettinger, Fransisca Kaufmann, MD  mirtazapine (REMERON) 15 MG tablet Take 1 tablet (15 mg total) by mouth at bedtime. 11/09/17  Yes Dettinger, Fransisca Kaufmann, MD  pantoprazole (PROTONIX) 40 MG tablet Take 1 tablet (40 mg total) by mouth daily. 11/09/17  Yes Dettinger, Fransisca Kaufmann, MD  Peppermint Oil (IBGARD PO) Take 1 tablet by mouth daily as needed (stomach pain).   Yes [provider]  potassium chloride SA (K-DUR,KLOR-CON) 10 MEQ tablet Take 1 tablet (10 mEq total) by mouth daily. 11/29/17  Yes Dettinger, Fransisca Kaufmann, MD  Acetaminophen (TYLENOL) 325 MG CAPS Take 650 mg by mouth every 6 (six) hours as needed for mild pain.    [provider]  ARTIFICIAL TEAR OP Apply 2 drops to eye daily as needed ([place]). To the left eye    [provider]  docusate (COLACE) 60 MG/15ML syrup Take 15 mLs (60 mg total) by mouth 2 (two) times daily. Patient not taking: Reported on 01/30/2018 12/23/17   Dettinger, Fransisca Kaufmann, MD  Eluxadoline (VIBERZI) 75 MG TABS Take 75 mg by mouth 2 (two) times daily. Patient not taking: Reported on 01/30/2018 11/23/17   Dettinger, Fransisca Kaufmann, MD  hydrocortisone (ANUSOL-HC) 25 MG suppository Place 1 suppository (25 mg total) rectally 2 (two) times daily. 12/23/17   Dettinger, Fransisca Kaufmann, MD  magnesium hydroxide (MILK OF  MAGNESIA) 400 MG/5ML suspension Take 15 mLs by mouth daily as needed for mild constipation. 12/23/17   Dettinger, Fransisca Kaufmann, MD  polyethylene glycol powder (GLYCOLAX/MIRALAX) powder Take 17 g by mouth daily as needed. 12/23/17   Dettinger, Fransisca Kaufmann, MD  rifaximin (XIFAXAN) 550 MG TABS tablet Take 1 tablet (550 mg total) by mouth 3 (three) times daily. Patient not taking: Reported on 01/30/2018 11/30/17   Dettinger, Fransisca Kaufmann, MD  traMADol (ULTRAM) 50 MG tablet Take 1 tablet (50 mg total) by mouth every 12 (twelve) hours as needed. 07/04/17   Timmothy Euler, MD  Vilazodone HCl (VIIBRYD) 10 MG TABS Take 1 tablet (10 mg total) by mouth daily. Patient not taking: Reported on 01/30/2018 11/23/17   Dettinger, Fransisca Kaufmann, MD  warfarin (COUMADIN) 5 MG tablet Take 1 tablet (5 mg total) by mouth daily. Patient taking differently: Take 5 mg by mouth daily. 5 mg on Mon, Wed, Fri, Sat/2.5 mg on Tues, Thurs, Sun 03/17/17   Elmarie Shiley, MD    Family History Family History  Problem Relation Age of Onset  . COPD Brother   . Atrial fibrillation Brother   . Diabetes Brother   . Liver cancer Brother        Alcohol-related  . Colon cancer Brother   . Heart attack Father   . Early death Father   . Rheum arthritis Mother   . Arthritis Mother   . Heart disease Mother   . Coronary artery disease Brother   . Heart attack Daughter   . Gallbladder disease Brother     Social History Social History   Tobacco Use  . Smoking status: Never Smoker  . Smokeless tobacco: Never Used  Substance Use Topics  . Alcohol use: No  . Drug use: No     Allergies   Penicillins   Review of Systems Review of Systems 10 Systems reviewed and are negative for acute change except as noted in the HPI.   Physical Exam Updated Vital Signs BP (!) 166/109   Pulse (!) 132   Temp 98.2 F (36.8 C) (Oral)   Resp 17   Wt 59 kg   SpO2  96%   BMI 23.04 kg/m   Physical Exam Constitutional:      Comments: Patient is  alert but seems confused.  No respiratory distress.  HENT:     Head: Normocephalic and atraumatic.     Nose: Nose normal.     Mouth/Throat:     Mouth: Mucous membranes are moist.  Eyes:     Extraocular Movements: Extraocular movements intact.     Pupils: Pupils are equal, round, and reactive to light.      ED Treatments / Results  Labs (all labs ordered are listed, but only abnormal results are displayed) Labs Reviewed  PROTIME-INR - Abnormal; Notable for the following components:      Result Value   Prothrombin Time 18.9 (*)    All other components within normal limits  APTT - Abnormal; Notable for the following components:   aPTT 37 (*)    All other components within normal limits  COMPREHENSIVE METABOLIC PANEL - Abnormal; Notable for the following components:   Sodium 134 (*)    Chloride 97 (*)    Glucose, Bld 115 (*)    Calcium 8.5 (*)    All other components within normal limits  URINALYSIS, ROUTINE W REFLEX MICROSCOPIC - Abnormal; Notable for the following components:   Hgb urine dipstick SMALL (*)    Ketones, ur 5 (*)    Bacteria, UA RARE (*)    All other components within normal limits  ETHANOL  CBC  DIFFERENTIAL  RAPID URINE DRUG SCREEN, HOSP PERFORMED  TROPONIN I  DIGOXIN LEVEL  AMMONIA  LEVETIRACETAM LEVEL  T3, FREE    EKG EKG Interpretation  Date/Time:  Monday January 30 2018 11:30:34 EST Ventricular Rate:  131 PR Interval:    QRS Duration: 92 QT Interval:  289 QTC Calculation: 427 R Axis:   -82 Text Interpretation:  Atrial fibrillation Left anterior fascicular block Anterior infarct, old ST depression, probably rate related no change from previous except increased rate Confirmed by Charlesetta Shanks [phone]) on 01/30/2018 3:33:34 PM   Radiology Ct Angio Head W Or Wo Contrast  Result Date: 01/30/2018 CLINICAL DATA:  Code stroke. Sudden onset of aphasia today. Previous CVA. Patient is on warfarin. EXAM: CT ANGIOGRAPHY HEAD AND NECK TECHNIQUE:  Multidetector CT imaging of the head and neck was performed using the standard protocol during bolus administration of intravenous contrast. Multiplanar CT image reconstructions and MIPs were obtained to evaluate the vascular anatomy. Carotid stenosis measurements (when applicable) are obtained utilizing NASCET criteria, using the distal internal carotid diameter as the denominator. CONTRAST:  56mL ISOVUE-370 IOPAMIDOL (ISOVUE-370) INJECTION 76% COMPARISON:  CT head without contrast 12/18/2017. MRI head 03/15/2017. MRA head 03/16/2017. FINDINGS: CTA NECK FINDINGS Aortic arch: A 3 vessel arch configuration is present. Atherosclerotic calcifications present in the distal arch without aneurysm or stenosis. Right carotid system: The right common carotid artery is within normal limits. Atherosclerotic calcifications are present at the right carotid bifurcation. There is no significant stenosis. There is focal tortuosity of the cervical right ICA with a % stenosis relative to the more distal vessel. No significant stenoses are present. Left carotid system: The left common carotid artery is within normal limits. Left bifurcation is unremarkable. There is moderate tortuosity of the cervical left ICA. A 50% stenosis is present in the mid left ICA relative to the more distal segments. No significant stenosis are present. Vertebral arteries: The vertebral arteries originate from the subclavian arteries bilaterally without significant stenosis. There is some tortuosity. No significant stenosis is  present in either vertebral artery in the neck Skeleton: Chronic endplate degenerative changes present at C4-5, C5-6, and C6-7. Osseous foraminal narrowing is worse on the left at these 3 levels. No focal lytic or blastic lesions are present. Other neck: Thyroid is unremarkable. Salivary glands are within normal limits. No significant adenopathy is present. No focal mucosal or submucosal lesions are present. Upper chest: The lung  apices are clear without focal nodule, mass, or airspace disease. Thoracic inlet is within normal limits. Review of the MIP images confirms the above findings CTA HEAD FINDINGS Anterior circulation: Atherosclerotic calcifications present within the cavernous internal carotid arteries bilaterally. The A1 and M1 segments are normal. The anterior communicating artery is patent. ACA and MCA branch vessels are normal. MCA bifurcations are unremarkable. Posterior circulation: The vertebral arteries are codominant. PICA origins are visualized and normal. The vertebrobasilar junction is normal. Posterior cerebral arteries originate from the basilar tip bilaterally. Venous sinuses: Dural sinuses are patent. Straight sinus deep cerebral veins are intact. Cortical veins are within normal limits. Anatomic variants: None Delayed phase: Delayed images demonstrate no pathologic enhancement. White matter disease is exaggerated. Review of the MIP images confirms the above findings IMPRESSION: 1. No emergent large vessel occlusion. 2. Diffuse white matter disease. 3. Tortuosity of the cervical internal carotid arteries bilaterally with narrowing of 50% or less relative to the more distal vessels. 4. Atherosclerotic calcifications at the cavernous internal carotid arteries bilaterally without significant stenosis. 5. Degenerative changes in the cervical spine. Electronically Signed   By: San Morelle M.D.   On: 01/30/2018 15:54   Ct Angio Neck W And/or Wo Contrast  Result Date: 01/30/2018 CLINICAL DATA:  Code stroke. Sudden onset of aphasia today. Previous CVA. Patient is on warfarin. EXAM: CT ANGIOGRAPHY HEAD AND NECK TECHNIQUE: Multidetector CT imaging of the head and neck was performed using the standard protocol during bolus administration of intravenous contrast. Multiplanar CT image reconstructions and MIPs were obtained to evaluate the vascular anatomy. Carotid stenosis measurements (when applicable) are obtained  utilizing NASCET criteria, using the distal internal carotid diameter as the denominator. CONTRAST:  45mL ISOVUE-370 IOPAMIDOL (ISOVUE-370) INJECTION 76% COMPARISON:  CT head without contrast 12/18/2017. MRI head 03/15/2017. MRA head 03/16/2017. FINDINGS: CTA NECK FINDINGS Aortic arch: A 3 vessel arch configuration is present. Atherosclerotic calcifications present in the distal arch without aneurysm or stenosis. Right carotid system: The right common carotid artery is within normal limits. Atherosclerotic calcifications are present at the right carotid bifurcation. There is no significant stenosis. There is focal tortuosity of the cervical right ICA with a % stenosis relative to the more distal vessel. No significant stenoses are present. Left carotid system: The left common carotid artery is within normal limits. Left bifurcation is unremarkable. There is moderate tortuosity of the cervical left ICA. A 50% stenosis is present in the mid left ICA relative to the more distal segments. No significant stenosis are present. Vertebral arteries: The vertebral arteries originate from the subclavian arteries bilaterally without significant stenosis. There is some tortuosity. No significant stenosis is present in either vertebral artery in the neck Skeleton: Chronic endplate degenerative changes present at C4-5, C5-6, and C6-7. Osseous foraminal narrowing is worse on the left at these 3 levels. No focal lytic or blastic lesions are present. Other neck: Thyroid is unremarkable. Salivary glands are within normal limits. No significant adenopathy is present. No focal mucosal or submucosal lesions are present. Upper chest: The lung apices are clear without focal nodule, mass, or airspace disease. Thoracic  inlet is within normal limits. Review of the MIP images confirms the above findings CTA HEAD FINDINGS Anterior circulation: Atherosclerotic calcifications present within the cavernous internal carotid arteries bilaterally. The  A1 and M1 segments are normal. The anterior communicating artery is patent. ACA and MCA branch vessels are normal. MCA bifurcations are unremarkable. Posterior circulation: The vertebral arteries are codominant. PICA origins are visualized and normal. The vertebrobasilar junction is normal. Posterior cerebral arteries originate from the basilar tip bilaterally. Venous sinuses: Dural sinuses are patent. Straight sinus deep cerebral veins are intact. Cortical veins are within normal limits. Anatomic variants: None Delayed phase: Delayed images demonstrate no pathologic enhancement. White matter disease is exaggerated. Review of the MIP images confirms the above findings IMPRESSION: 1. No emergent large vessel occlusion. 2. Diffuse white matter disease. 3. Tortuosity of the cervical internal carotid arteries bilaterally with narrowing of 50% or less relative to the more distal vessels. 4. Atherosclerotic calcifications at the cavernous internal carotid arteries bilaterally without significant stenosis. 5. Degenerative changes in the cervical spine. Electronically Signed   By: San Morelle M.D.   On: 01/30/2018 15:54   Ct Head Code Stroke Wo Contrast  Result Date: 01/30/2018 CLINICAL DATA:  Code stroke. Sudden onset of aphasia. Previous CVA. Patient on warfarin. EXAM: CT HEAD WITHOUT CONTRAST TECHNIQUE: Contiguous axial images were obtained from the base of the skull through the vertex without intravenous contrast. COMPARISON:  CT head without contrast 12/18/2017 FINDINGS: Brain: Advanced atrophy and white matter changes are present bilaterally. Acute hemorrhage is present. No acute cortical infarct is present. Basal ganglia are stable. Insular ribbon is intact bilaterally. Brainstem and cerebellum are. Vascular: Blood vessels are uniformly hyperdense. No focal asymmetry is evident. Skull: Calvarium is intact. No focal lytic or blastic lesions are present. Sinuses/Orbits: The paranasal sinuses and mastoid  air cells are clear. The globes and orbits are within normal limits. ASPECTS [facility] Stroke Program Early CT Score) - Ganglionic level infarction (caudate, lentiform nuclei, internal capsule, insula, M1-M3 cortex): 7/7 - Supraganglionic infarction (M4-M6 cortex): 3/3 Total score (0-10 with 10 being normal): 10/10 IMPRESSION: 1. No acute intracranial abnormality or significant interval change. 2. Stable advanced atrophy and white matter disease. 3. Blood vessels are uniformly hyperdense without focal asymmetry to suggest thrombus. 4. ASPECTS is 10/10 These results were called by telephone at the time of interpretation on 01/30/2018 at 12:20 pm to Dr. Charlesetta Shanks , who verbally acknowledged these results. Electronically Signed   By: San Morelle M.D.   On: 01/30/2018 12:21    Procedures Procedures (including critical care time) CRITICAL CARE Performed by: Charlesetta Shanks   Total critical care time: 30 minutes  Critical care time was exclusive of separately billable procedures and treating other patients.  Critical care was necessary to treat or prevent imminent or life-threatening deterioration.  Critical care was time spent personally by me on the following activities: development of treatment plan with patient and/or surrogate as well as nursing, discussions with consultants, evaluation of patient's response to treatment, examination of patient, obtaining history from patient or surrogate, ordering and performing treatments and interventions, ordering and review of laboratory studies, ordering and review of radiographic studies, pulse oximetry and re-evaluation of patient's condition. Medications Ordered in ED Medications  sodium chloride (PF) 0.9 % injection (has no administration in time range)  iopamidol (ISOVUE-370) 76 % injection (has no administration in time range)  iopamidol (ISOVUE-370) 76 % injection 100 mL (80 mLs Intravenous Contrast Given 01/30/18 1447)     Initial  Impression / Assessment and Plan / ED Course  I have reviewed the triage vital signs and the nursing notes.  Pertinent labs & imaging results that were available during my care of the patient were reviewed by me and considered in my medical decision making (see chart for details).  Clinical Course as of Jan 30 1606  Mon Jan 30, 2018  1253 Consult: Reviewed with Dr. Lorraine Lax.  Advises to get telemetry neuro consultation for possible LVO and reconsult. Already on coumadin, not TPA candidate.   [MP]  8676 Consult: Reviewed with telemetry neurologist.  They did not identify immediate LVO symptoms however in combination with my exam of neglect when both sides are stimulated, will proceed with perfusion study.  Patient's BUN/creatinine normal.   [MP]    Clinical Course User Index [MP] Charlesetta Shanks, MD    Patient presented with a reported mental status change.  Last known well was sometime in the p.m. estimated to be 9 PM by the son's last phone call with the patient.  Patient does have dementia at baseline somewhat confusing the picture but on my exam she exhibited signs of a aphasia and neglect for the right side.  This was reviewed with Dr. Lorraine Lax and the telemetry neurologist.  First CT is negative.  Advised to proceed with CT angiogram.  Dr. Lorraine Lax advises if CT angiogram does not show any acute obstructions, patient can be admitted for stroke work-up and MRI.  Final Clinical Impressions(s) / ED Diagnoses   Final diagnoses:  Confusion state  Expressive aphasia  Anticoagulated on Coumadin    ED Discharge Orders    None       Charlesetta Shanks, MD 01/30/18 1609

## 2018-01-30 NOTE — ED Notes (Signed)
Son [name], [phone] can be called if need anything.  Sitter will be patient until 7 pm and then another sitter will come to replace that one.

## 2018-01-30 NOTE — ED Notes (Signed)
Neuro MD spoke with pt and pt son about plan of care recommendations and interventions that may be needed. Per Neuro and son is agreeable: No interventions at this time, MRI needed and monitor symptoms. Neuro to contact ED doc.

## 2018-01-30 NOTE — ED Notes (Signed)
Phlebotomy at bedside.

## 2018-01-30 NOTE — ED Triage Notes (Addendum)
Pt arrives via [facility] from home. Per pts family: Pt was eating breakfast this am and began having trouble eating and drinking and became more confused than normal. Pt has Dementia at baseline. Per EMS: Pt is alert but confused at this time.Stroke screen negative per EMS.  Pt has HX of a fib, family denies previous stroke but stroke is present on medical hx. Pt is on Warfarin. Aphasia present on exam

## 2018-01-30 NOTE — Telephone Encounter (Signed)
Informed pt's son of monitor results. Advised him of Dr. Torrie Mayers recommendations. He voiced understanding. Sent front office staff message to schedule appt.

## 2018-01-30 NOTE — ED Notes (Addendum)
Tele Neuro at bedside speaking with pt Faustino Congress MD)

## 2018-01-30 NOTE — ED Notes (Signed)
Pt in CT @ 1145

## 2018-01-30 NOTE — ED Notes (Signed)
Sitter at bedside. Pt trying to get up to urinate. Staff has repeatedly explained that pt has purewick in place and can urinate where she is.

## 2018-01-30 NOTE — Consult Note (Addendum)
[facility] Tele[facility]urology Consult Services   Date of Service:   01/30/2018 13:24:01  Impression:     .  Left Hemispheric Infarct     .  MCA Distribution Infarct  Comments: I discussed the case in detail with the patient and the family. Her NIH stroke scale is 3. This is primarily because of her memory issues. At this point I am going to recommend getting a CTA of the head with perfusion to make sure she does not have any large vessel occlusion. If that is negative, I would recommend keeping her in the hospital, getting an MRI, echocardiogram and other stroke work-up. She should be n.p.o. until seen by speech.  Addendum: reviewed the CTA of the head and it looks negative for any LVO. Will check official report once avaialble  Mechanism of Stroke: Possible Thromboembolic  Metrics: Last Known Well: 01/23/2018 21:00:00 [facility] Notification Time: 01/30/2018 13:22:17 Arrival Time: 01/30/2018 10:55:00 Stamp Time: 01/30/2018 13:24:01 Time First Login Attempt: 01/30/2018 13:30:37 Video Start Time: 01/30/2018 13:30:37  Symptoms: Speech difficulty and confusion NIHSS Start Assessment Time: 01/30/2018 13:38:54 Patient is not a candidate for tPA. Patient was not deemed candidate for tPA thrombolytics because of Last Well Known Above 4.5 Hours. Video End Time: 01/30/2018 13:57:33  CT head showed no acute hemorrhage or acute core infarct.  Presentation is not suggestive of Large Vessel Occlusive disease.  Advanced imaging CTA head and neck obtained. Advanced imaging CTP obtained.   Radiologist was not called back for review of advanced imaging because Will review once is available. ED Physician notified of diagnostic impression and management plan on 01/30/2018 13:58:00  Our recommendations are outlined below.  Recommendations:     .  Activate Stroke Protocol Admission/Order Set     .  Stroke/Telemetry Floor     .  Neuro Checks     .  Bedside Swallow  Eval     .  DVT Prophylaxis     .  IV Fluids, Normal Saline     .  Head of Bed Below 30 Degrees     .  Euglycemia and Avoid Hyperthermia (PRN Acetaminophen)     .  Antiplatelet Therapy Recommended  Routine Consultation with [facility] for Follow up Care  Sign Out:     .  Discussed with Emergency Department Provider     .  Discussed with Rapid Response Team    ------------------------------------------------------------------------------  History of Present Illness: Patient is a 85 year old Female.  Patient was brought by EMS for symptoms of Speech difficulty and confusion  85 year old female came to the hospital because of some speech difficulty. She was last seen normal around 9 PM. This morning when she woke up the family noted some speech difficulty. She was brought to the hospital. Initial CAT scan was negative. Patient is confused and has underlying history of dementia. There is no history of seizures. She is also taking Coumadin and the dosage was recently adjusted. There is no new focal weakness. She walks with a walker or a cane at home. Memory has been progressively getting worse for the past some time  CT head showed no acute hemorrhage or acute core infarct.  Last seen normal was beyond 4.5 hours of presentation. There is no history of hemorrhagic complications or intracranial hemorrhage. There is history of Recent Anticoagulants. There is no history of recent major surgery. There is no history of recent stroke.  Examination: 1A: Level of Consciousness - Alert; keenly responsive + 0 1B: Ask  Month and Age - 1 Question Right + 1 1C: Blink Eyes & Squeeze Hands - Performs Both Tasks + 0 2: Test Horizontal Extraocular Movements - Normal + 0 3: Test Visual Fields - No Visual Loss + 0 4: Test Facial Palsy (Use Grimace if Obtunded) - Normal symmetry + 0 5A: Test Left Arm Motor Drift - No Drift for 10 Seconds + 0 5B: Test Right Arm Motor Drift - No Drift for 10 Seconds  + 0 6A: Test Left Leg Motor Drift - No Drift for 5 Seconds + 0 6B: Test Right Leg Motor Drift - No Drift for 5 Seconds + 0 7: Test Limb Ataxia (FNF/Heel-Shin) - No Ataxia + 0 8: Test Sensation - Normal; No sensory loss + 0 9: Test Language/Aphasia - Mild-Moderate Aphasia: Some Obvious Changes, Without Significant Limitation + 1 10: Test Dysarthria - Mild-Moderate Dysarthria: Slurring but can be understood + 1 11: Test Extinction/Inattention - No abnormality + 0  NIHSS Score: 3  Patient was informed the Neurology Consult would happen via TeleHealth consult by way of interactive audio and video telecommunications and consented to receiving care in this manner.  Due to the immediate potential for life-threatening deterioration due to underlying acute neurologic illness, I spent 35 minutes providing critical care. This time includes time for face to face visit via telemedicine, review of medical records, imaging studies and discussion of findings with providers, the patient and/or family.   Dr Faustino Congress   [facility] [phone]

## 2018-01-30 NOTE — Consult Note (Signed)
Neurology Consultation Reason for Consult: Memory issues Referring Physician:  Theodis Sato  CC: Confusion  History is obtained from: Patient, chart review  HPI: [name] is a 85 y.o. female who lives with her sister and was at her normal baseline last night.  She apparently has some mild problems with memory even at baseline, but is generally relatively coherent.  This morning when she began interacting with her sister, she was confused and having trouble eating and drinking.  The patient has no idea why she is in the hospital, and when asked how she arrived to the hospital she states that her son carried her here, however she arrived by [facility].  LKW: 1/26 prior to bed tpa given?: no, out of window  ROS: A 14 point ROS was performed and is negative except as noted in the HPI.  Past Medical History:  Diagnosis Date  . Anemia   . Atrial fibrillation (HCC)    Paroxysmal, Flecainide therapy  . Calf pain    September, 2012, at rest  . Carotid bruit    Doppler, December, 2009, no abnormality  . Cataract   . Diverticulosis   . GERD (gastroesophageal reflux disease)   . History of shingles   . HLD (hyperlipidemia)   . HTN (hypertension)   . Hypothyroidism   . Insomnia   . Osteoarthritis   . PONV (postoperative nausea and vomiting)   . Rectal bleeding 2001   diverticulosis and int hemorrhoids on 07/1999 and 02/2010 colonoscopies.  . Stroke ([place])   . Warfarin anticoagulation      Family History  Problem Relation Age of Onset  . COPD Brother   . Atrial fibrillation Brother   . Diabetes Brother   . Liver cancer Brother        Alcohol-related  . Colon cancer Brother   . Heart attack Father   . Early death Father   . Rheum arthritis Mother   . Arthritis Mother   . Heart disease Mother   . Coronary artery disease Brother   . Heart attack Daughter   . Gallbladder disease Brother      Social History:  reports that she has never smoked. She has never used  smokeless tobacco. She reports that she does not drink alcohol or use drugs.   Exam: Current vital signs: BP (!) 158/73   Pulse 85   Temp 98.2 F (36.8 C) (Oral)   Resp 19   Wt 59 kg   SpO2 93%   BMI 23.04 kg/m  Vital signs in last 24 hours: Temp:  [98.2 F (36.8 C)] 98.2 F (36.8 C) (01/27 1119) Pulse Rate:  [62-156] 85 (01/27 1600) Resp:  [17-22] 19 (01/27 1600) BP: (135-168)/(73-121) 158/73 (01/27 1600) SpO2:  [93 %-96 %] 93 % (01/27 1600) Weight:  [59 kg] 59 kg (01/27 1125)   Physical Exam  Constitutional: Appears well-developed and well-nourished.  Psych: Affect appropriate to situation Eyes: No scleral injection HENT: No OP obstrucion Head: Normocephalic.  Cardiovascular: Normal rate and regular rhythm.  Respiratory: Effort normal, non-labored breathing GI: Soft.  No distension. There is no tenderness.  Skin: WDI  Neuro: Mental Status: Patient is awake, alert, she is able to tell me that she is at "[facility]," she gives the month is March and the year is 2009. Patient is able to give a clear and coherent history. She has some perseveration, but is able to name simple objects. Cranial Nerves: II: Visual Fields are full. Pupils are equal,  round, and reactive to light.   III,IV, VI: EOMI without ptosis or diploplia.  V: Facial sensation is symmetric to temperature VII: Facial movement is symmetric.  VIII: hearing is intact to voice X: Uvula elevates symmetrically XI: Shoulder shrug is symmetric. XII: tongue is midline without atrophy or fasciculations.  Motor: Tone is normal. Bulk is normal. 5/5 strength was present in all four extremities.  Sensory: Sensation is symmetric to light touch and temperature in the arms and legs. Cerebellar: FNF with mild intentional tremor bilaterally   I have reviewed labs in epic and the results pertinent to this consultation are: CMP-borderline hyponatremia at 134 Urinalysis without evidence of infection  I have  reviewed the images obtained:CT angiogram head neck- no large vessel occlusion  Impression: 85 year old with dementia with increased confusion.  At this point, differential remains broad including mild delirium due to an as yet on identified physiological stressor, acute ischemic infarct, metabolic encephalopathy.  She is being admitted for further evaluation.  Recommendations: 1) MRI brain 2) agree with Keppra level, continue home dose of 250 twice daily 3) avoid sedating or anticholinergic medications 4) EEG 5) neurology will continue to follow  Roland Rack, MD [facility] [phone]  If 7pm- 7am, please page neurology on call as listed in [place].

## 2018-01-30 NOTE — H&P (Signed)
History and Physical    [name] ZOX:[id number] DOB: 03-Sep-1932 DOA: 01/30/2018  PCP: Dettinger, Fransisca Kaufmann, MD   Patient coming from: Home   Chief Complaint: Confusion, possible aphasia  HPI: [name] is a 85 y.o. female with medical history significant of paroxysmal atrial fibrillation/flutter on warfarin anticoagulation, history of TIAs, dementia, chronic diastolic grade 1 heart failure (03/16/2017 echo), nonobstructive coronary artery disease by catheterization on 06/22/2016, hyperlipidemia, hypertension, seizure disorder, Hypothyroidism who presents from home with confusion and possible aphasia concerning for stroke.  There is no family in the room to confirm the patient's story.  Family did not pick up the phone when called.  On review of the chart patient apparently was eating breakfast and was not noted to be like herself.  She was noted to have some trouble with eating and drinking.  He was noted to not be speaking normally.  She would not respond patients appropriately but respond to other ones.  Patient apparently was transported by EMS here.  Patient currently denies any chest pain, abdominal pain, lower extremity edema, orthopnea, syncope/presyncope, seizures.  ED Course: In the ER patient's vitals underwent notable for brief runs of atrial fibrillation with rapid ventricular response.  Patient was mildly hypertensive.  Noncontrast CT and CTA of the head and neck showed no acute stroke.  INR was subtherapeutic at 1.6.  CBC was unremarkable.  CMP showed very mild hyponatremia.  Troponin was negative.  EKG showed atrial fibrillation with rapid ventricular response.  Review of Systems: As per HPI otherwise 10 point review of systems negative.    Past Medical History:  Diagnosis Date  . Anemia   . Atrial fibrillation (HCC)    Paroxysmal, Flecainide therapy  . Calf pain    September, 2012, at rest  . Carotid bruit    Doppler, December, 2009, no abnormality  . Cataract   .  Diverticulosis   . GERD (gastroesophageal reflux disease)   . History of shingles   . HLD (hyperlipidemia)   . HTN (hypertension)   . Hypothyroidism   . Insomnia   . Osteoarthritis   . PONV (postoperative nausea and vomiting)   . Rectal bleeding 2001   diverticulosis and int hemorrhoids on 07/1999 and 02/2010 colonoscopies.  . Stroke ([place])   . Warfarin anticoagulation     Past Surgical History:  Procedure Laterality Date  . BIOPSY  09/29/2015   Procedure: BIOPSY;  Surgeon: Rogene Houston, MD;  [facility]: [facility];  Service: Endoscopy;;  gastric  . CATARACT EXTRACTION W/PHACO Right 04/15/2014   Procedure: CATARACT EXTRACTION PHACO AND INTRAOCULAR LENS PLACEMENT (IOC);  Surgeon: Tonny Branch, MD;  Location: [facility];  Service: Ophthalmology;  Laterality: Right;  CDE: 13.30  . CATARACT EXTRACTION W/PHACO Left 05/13/2014   Procedure: CATARACT EXTRACTION PHACO AND INTRAOCULAR LENS PLACEMENT LEFT EYE;  Surgeon: Tonny Branch, MD;  Location: [facility];  Service: Ophthalmology;  Laterality: Left;  CDE:13.00  . CHOLECYSTECTOMY N/A 01/15/2015   Procedure: LAPAROSCOPIC CHOLECYSTECTOMY;  Surgeon: Ralene Ok, MD;  Location: [name];  Service: General;  Laterality: N/A;  . COLONOSCOPY N/A 09/29/2015   Procedure: COLONOSCOPY;  Surgeon: Rogene Houston, MD;  [facility];  Service: Endoscopy;  Laterality: N/A;  . COLONOSCOPY    . ESOPHAGOGASTRODUODENOSCOPY N/A 01/13/2015   Procedure: ESOPHAGOGASTRODUODENOSCOPY (EGD);  Surgeon: Jerene Bears, MD;  Location[facility];  Service: Endoscopy;  Laterality: N/A;  . ESOPHAGOGASTRODUODENOSCOPY N/A 09/29/2015   Procedure: ESOPHAGOGASTRODUODENOSCOPY (EGD);  Surgeon: Rogene Houston,  MD;  [facility];  Service: Endoscopy;  Laterality: N/A;  12:15  . GALLBLADDER SURGERY    . LEFT HEART CATH AND CORONARY ANGIOGRAPHY N/A 06/22/2016   Procedure: Left Heart Cath and Coronary Angiography;  Surgeon: Troy Sine, MD;  Location: [facility];   Service: Cardiovascular;  Laterality: N/A;  . SHOULDER OPEN ROTATOR CUFF REPAIR     bilateral  . TOTAL KNEE ARTHROPLASTY  05/26/10   right  . TOTAL KNEE ARTHROPLASTY  11/17/2010   Procedure: TOTAL KNEE ARTHROPLASTY;  Surgeon: Mauri Pole;  Location: [facility];  Service: Orthopedics;  Laterality: Left;  . TOTAL KNEE ARTHROPLASTY     Right  . TOTAL SHOULDER REPLACEMENT  01/2010   left     reports that she has never smoked. She has never used smokeless tobacco. She reports that she does not drink alcohol or use drugs.  Allergies  Allergen Reactions  . Penicillins Rash and Other (See Comments)    Has patient had a PCN reaction causing immediate rash, facial/tongue/throat swelling, SOB or lightheadedness with hypotension: No Has patient had a PCN reaction causing severe rash involving mucus membranes or skin necrosis: No Has patient had a PCN reaction that required hospitalization No Has patient had a PCN reaction occurring within the last 10 years: No If all of the above answers are "NO", then may proceed with Cephalosporin use.    Family History  Problem Relation Age of Onset  . COPD Brother   . Atrial fibrillation Brother   . Diabetes Brother   . Liver cancer Brother        Alcohol-related  . Colon cancer Brother   . Heart attack Father   . Early death Father   . Rheum arthritis Mother   . Arthritis Mother   . Heart disease Mother   . Coronary artery disease Brother   . Heart attack Daughter   . Gallbladder disease Brother      Prior to Admission medications   Medication Sig Start Date End Date Taking? Authorizing Provider  atorvastatin (LIPITOR) 20 MG tablet Take 1 tablet (20 mg total) by mouth at bedtime. 11/09/17  Yes Dettinger, Fransisca Kaufmann, MD  cephALEXin (KEFLEX) 500 MG capsule Take 500 mg by mouth 4 (four) times daily. For 7 days   Yes [provider]  digoxin (LANOXIN) 0.125 MG tablet Take 1 tablet (0.125 mg total) by mouth daily. 12/21/17  Yes Minus Breeding,  MD  docusate sodium (COLACE) 50 MG capsule Take 100 mg by mouth daily.   Yes [provider]  donepezil (ARICEPT) 5 MG tablet Take 1 tablet (5 mg total) by mouth at bedtime. FOR MEMORY 10/18/17  Yes Dettinger, Fransisca Kaufmann, MD  flecainide (TAMBOCOR) 100 MG tablet 100 mg. Take 1/2 tablet 50 mg by mouth twice daily   Yes [provider]  irbesartan (AVAPRO) 150 MG tablet TAKE 1 TABLET EVERY DAY (Needs to see PCP) Patient taking differently: Take 150 mg by mouth daily.  10/10/17  Yes Dettinger, Fransisca Kaufmann, MD  levETIRAcetam (KEPPRA) 250 MG tablet Take 1 tablet (250 mg total) by mouth 2 (two) times daily. 11/09/17  Yes Dettinger, Fransisca Kaufmann, MD  levothyroxine (SYNTHROID, LEVOTHROID) 50 MCG tablet Take 1 tablet daily before breakfast 11/09/17  Yes Dettinger, Fransisca Kaufmann, MD  metoprolol succinate (TOPROL-XL) 100 MG 24 hr tablet Take 1 tablet (100 mg total) by mouth daily. Take with or immediately following a meal. 11/09/17  Yes Dettinger, Fransisca Kaufmann, MD  mirtazapine (REMERON) 15 MG tablet Take 1 tablet (15 mg total) by mouth at bedtime. 11/09/17  Yes Dettinger, Fransisca Kaufmann, MD  pantoprazole (PROTONIX) 40 MG tablet Take 1 tablet (40 mg total) by mouth daily. 11/09/17  Yes Dettinger, Fransisca Kaufmann, MD  Peppermint Oil (IBGARD PO) Take 1 tablet by mouth daily as needed (stomach pain).   Yes [provider]  potassium chloride SA (K-DUR,KLOR-CON) 10 MEQ tablet Take 1 tablet (10 mEq total) by mouth daily. 11/29/17  Yes Dettinger, Fransisca Kaufmann, MD  Acetaminophen (TYLENOL) 325 MG CAPS Take 650 mg by mouth every 6 (six) hours as needed for mild pain.    [provider]  ARTIFICIAL TEAR OP Apply 2 drops to eye daily as needed (Dryness). To the left eye    [provider]  docusate (COLACE) 60 MG/15ML syrup Take 15 mLs (60 mg total) by mouth 2 (two) times daily. Patient not taking: Reported on 01/30/2018 12/23/17   Dettinger, Fransisca Kaufmann, MD  Eluxadoline (VIBERZI) 75 MG TABS Take 75 mg by mouth 2 (two)  times daily. Patient not taking: Reported on 01/30/2018 11/23/17   Dettinger, Fransisca Kaufmann, MD  hydrocortisone (ANUSOL-HC) 25 MG suppository Place 1 suppository (25 mg total) rectally 2 (two) times daily. 12/23/17   Dettinger, Fransisca Kaufmann, MD  magnesium hydroxide (MILK OF MAGNESIA) 400 MG/5ML suspension Take 15 mLs by mouth daily as needed for mild constipation. 12/23/17   Dettinger, Fransisca Kaufmann, MD  polyethylene glycol powder (GLYCOLAX/MIRALAX) powder Take 17 g by mouth daily as needed. 12/23/17   Dettinger, Fransisca Kaufmann, MD  rifaximin (XIFAXAN) 550 MG TABS tablet Take 1 tablet (550 mg total) by mouth 3 (three) times daily. Patient not taking: Reported on 01/30/2018 11/30/17   Dettinger, Fransisca Kaufmann, MD  traMADol (ULTRAM) 50 MG tablet Take 1 tablet (50 mg total) by mouth every 12 (twelve) hours as needed. 07/04/17   Timmothy Euler, MD  Vilazodone HCl (VIIBRYD) 10 MG TABS Take 1 tablet (10 mg total) by mouth daily. Patient not taking: Reported on 01/30/2018 11/23/17   Dettinger, Fransisca Kaufmann, MD  warfarin (COUMADIN) 5 MG tablet Take 1 tablet (5 mg total) by mouth daily. Patient taking differently: Take 5 mg by mouth daily. 5 mg on Mon, Wed, Fri, Sat/2.5 mg on Tues, Thurs, Sun 03/17/17   Elmarie Shiley, MD    Physical Exam: Vitals:   01/30/18 1125 01/30/18 1208 01/30/18 1400 01/30/18 1430  BP:  (!) 168/98 (!) 166/109   Pulse:  62 (!) 156 (!) 132  Resp:  (!) 22 19 17   Temp:      TempSrc:      SpO2:  94% 94% 96%  Weight: 59 kg       Constitutional: NAD, calm, comfortable Vitals:   01/30/18 1125 01/30/18 1208 01/30/18 1400 01/30/18 1430  BP:  (!) 168/98 (!) 166/109   Pulse:  62 (!) 156 (!) 132  Resp:  (!) 22 19 17   Temp:      TempSrc:      SpO2:  94% 94% 96%  Weight: 59 kg      Eyes: Anicteric sclera, pupils equally round and reactive to light ENMT: Poor dentition, moist mucous membranes Neck: normal, supple Respiratory: Clear to auscultation bilaterally, no wheezes, rhonchi, rales Cardiovascular:  Irregularly irregular, no murmurs.  Abdomen: no tenderness, no masses palpated. No hepatosplenomegaly. Bowel sounds positive.  Musculoskeletal: 1+ lower extremity edema, mildly shortened left shoulder.  Skin: Numerous well-healed incisions over shoulder and knees Neurologic: Cranial  nerves II through XII intact, able to move all 4 extremities with 5 out of 5 strength but very difficult to follow commands, Psychiatric: Unable to assess due to underlying dementia   Labs on Admission: I have personally reviewed following labs and imaging studies  CBC: Recent Labs  Lab 01/30/18 1212  WBC 7.8  NEUTROABS 5.5  HGB 14.0  HCT 44.0  MCV 95.2  PLT 694   Basic Metabolic Panel: Recent Labs  Lab 01/30/18 1212  NA 134*  K 4.2  CL 97*  CO2 25  GLUCOSE 115*  BUN 10  CREATININE 0.59  CALCIUM 8.5*   GFR: Estimated Creatinine Clearance: 44.1 mL/min (by C-G formula based on SCr of 0.59 mg/dL). Liver Function Tests: Recent Labs  Lab 01/30/18 1212  AST 28  ALT 18  ALKPHOS 54  BILITOT 1.0  PROT 7.2  ALBUMIN 3.8   No results for input(s): LIPASE, AMYLASE in the last 168 hours. Recent Labs  Lab 01/30/18 1309  AMMONIA 13   Coagulation Profile: Recent Labs  Lab 01/30/18 1212  INR 1.60   Cardiac Enzymes: Recent Labs  Lab 01/30/18 1212  TROPONINI <0.03   BNP (last 3 results) No results for input(s): PROBNP in the last 8760 hours. HbA1C: No results for input(s): HGBA1C in the last 72 hours. CBG: No results for input(s): GLUCAP in the last 168 hours. Lipid Profile: No results for input(s): CHOL, HDL, LDLCALC, TRIG, CHOLHDL, LDLDIRECT in the last 72 hours. Thyroid Function Tests: No results for input(s): TSH, T4TOTAL, FREET4, T3FREE, THYROIDAB in the last 72 hours. Anemia Panel: No results for input(s): VITAMINB12, FOLATE, FERRITIN, TIBC, IRON, RETICCTPCT in the last 72 hours. Urine analysis:    Component Value Date/Time   COLORURINE YELLOW 01/30/2018 1358    APPEARANCEUR CLEAR 01/30/2018 1358   APPEARANCEUR Clear 01/06/2018 1507   LABSPEC 1.010 01/30/2018 1358   PHURINE 8.0 01/30/2018 1358   GLUCOSEU NEGATIVE 01/30/2018 1358   HGBUR SMALL (A) 01/30/2018 1358   BILIRUBINUR NEGATIVE 01/30/2018 1358   BILIRUBINUR Negative 01/06/2018 1507   KETONESUR 5 (A) 01/30/2018 1358   PROTEINUR NEGATIVE 01/30/2018 1358   UROBILINOGEN 0.2 11/09/2010 1235   NITRITE NEGATIVE 01/30/2018 1358   LEUKOCYTESUR NEGATIVE 01/30/2018 1358   LEUKOCYTESUR Trace (A) 01/06/2018 1507    Radiological Exams on Admission: Ct Angio Head W Or Wo Contrast  Result Date: 01/30/2018 CLINICAL DATA:  Code stroke. Sudden onset of aphasia today. Previous CVA. Patient is on warfarin. EXAM: CT ANGIOGRAPHY HEAD AND NECK TECHNIQUE: Multidetector CT imaging of the head and neck was performed using the standard protocol during bolus administration of intravenous contrast. Multiplanar CT image reconstructions and MIPs were obtained to evaluate the vascular anatomy. Carotid stenosis measurements (when applicable) are obtained utilizing NASCET criteria, using the distal internal carotid diameter as the denominator. CONTRAST:  62mL ISOVUE-370 IOPAMIDOL (ISOVUE-370) INJECTION 76% COMPARISON:  CT head without contrast 12/18/2017. MRI head 03/15/2017. MRA head 03/16/2017. FINDINGS: CTA NECK FINDINGS Aortic arch: A 3 vessel arch configuration is present. Atherosclerotic calcifications present in the distal arch without aneurysm or stenosis. Right carotid system: The right common carotid artery is within normal limits. Atherosclerotic calcifications are present at the right carotid bifurcation. There is no significant stenosis. There is focal tortuosity of the cervical right ICA with a % stenosis relative to the more distal vessel. No significant stenoses are present. Left carotid system: The left common carotid artery is within normal limits. Left bifurcation is unremarkable. There is moderate tortuosity of  the  cervical left ICA. A 50% stenosis is present in the mid left ICA relative to the more distal segments. No significant stenosis are present. Vertebral arteries: The vertebral arteries originate from the subclavian arteries bilaterally without significant stenosis. There is some tortuosity. No significant stenosis is present in either vertebral artery in the neck Skeleton: Chronic endplate degenerative changes present at C4-5, C5-6, and C6-7. Osseous foraminal narrowing is worse on the left at these 3 levels. No focal lytic or blastic lesions are present. Other neck: Thyroid is unremarkable. Salivary glands are within normal limits. No significant adenopathy is present. No focal mucosal or submucosal lesions are present. Upper chest: The lung apices are clear without focal nodule, mass, or airspace disease. Thoracic inlet is within normal limits. Review of the MIP images confirms the above findings CTA HEAD FINDINGS Anterior circulation: Atherosclerotic calcifications present within the cavernous internal carotid arteries bilaterally. The A1 and M1 segments are normal. The anterior communicating artery is patent. ACA and MCA branch vessels are normal. MCA bifurcations are unremarkable. Posterior circulation: The vertebral arteries are codominant. PICA origins are visualized and normal. The vertebrobasilar junction is normal. Posterior cerebral arteries originate from the basilar tip bilaterally. Venous sinuses: Dural sinuses are patent. Straight sinus deep cerebral veins are intact. Cortical veins are within normal limits. Anatomic variants: None Delayed phase: Delayed images demonstrate no pathologic enhancement. White matter disease is exaggerated. Review of the MIP images confirms the above findings IMPRESSION: 1. No emergent large vessel occlusion. 2. Diffuse white matter disease. 3. Tortuosity of the cervical internal carotid arteries bilaterally with narrowing of 50% or less relative to the more distal  vessels. 4. Atherosclerotic calcifications at the cavernous internal carotid arteries bilaterally without significant stenosis. 5. Degenerative changes in the cervical spine. Electronically Signed   By: San Morelle M.D.   On: 01/30/2018 15:54   Ct Angio Neck W And/or Wo Contrast  Result Date: 01/30/2018 CLINICAL DATA:  Code stroke. Sudden onset of aphasia today. Previous CVA. Patient is on warfarin. EXAM: CT ANGIOGRAPHY HEAD AND NECK TECHNIQUE: Multidetector CT imaging of the head and neck was performed using the standard protocol during bolus administration of intravenous contrast. Multiplanar CT image reconstructions and MIPs were obtained to evaluate the vascular anatomy. Carotid stenosis measurements (when applicable) are obtained utilizing NASCET criteria, using the distal internal carotid diameter as the denominator. CONTRAST:  55mL ISOVUE-370 IOPAMIDOL (ISOVUE-370) INJECTION 76% COMPARISON:  CT head without contrast 12/18/2017. MRI head 03/15/2017. MRA head 03/16/2017. FINDINGS: CTA NECK FINDINGS Aortic arch: A 3 vessel arch configuration is present. Atherosclerotic calcifications present in the distal arch without aneurysm or stenosis. Right carotid system: The right common carotid artery is within normal limits. Atherosclerotic calcifications are present at the right carotid bifurcation. There is no significant stenosis. There is focal tortuosity of the cervical right ICA with a % stenosis relative to the more distal vessel. No significant stenoses are present. Left carotid system: The left common carotid artery is within normal limits. Left bifurcation is unremarkable. There is moderate tortuosity of the cervical left ICA. A 50% stenosis is present in the mid left ICA relative to the more distal segments. No significant stenosis are present. Vertebral arteries: The vertebral arteries originate from the subclavian arteries bilaterally without significant stenosis. There is some tortuosity. No  significant stenosis is present in either vertebral artery in the neck Skeleton: Chronic endplate degenerative changes present at C4-5, C5-6, and C6-7. Osseous foraminal narrowing is worse on the left at these 3 levels. No  focal lytic or blastic lesions are present. Other neck: Thyroid is unremarkable. Salivary glands are within normal limits. No significant adenopathy is present. No focal mucosal or submucosal lesions are present. Upper chest: The lung apices are clear without focal nodule, mass, or airspace disease. Thoracic inlet is within normal limits. Review of the MIP images confirms the above findings CTA HEAD FINDINGS Anterior circulation: Atherosclerotic calcifications present within the cavernous internal carotid arteries bilaterally. The A1 and M1 segments are normal. The anterior communicating artery is patent. ACA and MCA branch vessels are normal. MCA bifurcations are unremarkable. Posterior circulation: The vertebral arteries are codominant. PICA origins are visualized and normal. The vertebrobasilar junction is normal. Posterior cerebral arteries originate from the basilar tip bilaterally. Venous sinuses: Dural sinuses are patent. Straight sinus deep cerebral veins are intact. Cortical veins are within normal limits. Anatomic variants: None Delayed phase: Delayed images demonstrate no pathologic enhancement. White matter disease is exaggerated. Review of the MIP images confirms the above findings IMPRESSION: 1. No emergent large vessel occlusion. 2. Diffuse white matter disease. 3. Tortuosity of the cervical internal carotid arteries bilaterally with narrowing of 50% or less relative to the more distal vessels. 4. Atherosclerotic calcifications at the cavernous internal carotid arteries bilaterally without significant stenosis. 5. Degenerative changes in the cervical spine. Electronically Signed   By: San Morelle M.D.   On: 01/30/2018 15:54   Ct Head Code Stroke Wo Contrast  Result  Date: 01/30/2018 CLINICAL DATA:  Code stroke. Sudden onset of aphasia. Previous CVA. Patient on warfarin. EXAM: CT HEAD WITHOUT CONTRAST TECHNIQUE: Contiguous axial images were obtained from the base of the skull through the vertex without intravenous contrast. COMPARISON:  CT head without contrast 12/18/2017 FINDINGS: Brain: Advanced atrophy and white matter changes are present bilaterally. Acute hemorrhage is present. No acute cortical infarct is present. Basal ganglia are stable. Insular ribbon is intact bilaterally. Brainstem and cerebellum are. Vascular: Blood vessels are uniformly hyperdense. No focal asymmetry is evident. Skull: Calvarium is intact. No focal lytic or blastic lesions are present. Sinuses/Orbits: The paranasal sinuses and mastoid air cells are clear. The globes and orbits are within normal limits. ASPECTS [facility] Stroke Program Early CT Score) - Ganglionic level infarction (caudate, lentiform nuclei, internal capsule, insula, M1-M3 cortex): 7/7 - Supraganglionic infarction (M4-M6 cortex): 3/3 Total score (0-10 with 10 being normal): 10/10 IMPRESSION: 1. No acute intracranial abnormality or significant interval change. 2. Stable advanced atrophy and white matter disease. 3. Blood vessels are uniformly hyperdense without focal asymmetry to suggest thrombus. 4. ASPECTS is 10/10 These results were called by telephone at the time of interpretation on 01/30/2018 at 12:20 pm to Dr. Charlesetta Shanks , who verbally acknowledged these results. Electronically Signed   By: San Morelle M.D.   On: 01/30/2018 12:21    EKG: Independently reviewed.  Tachycardic, irregularly irregular, no acute ST segment changes, unchanged from prior  Assessment/Plan Active Problems:   Warfarin anticoagulation   Hypothyroidism   S/P knee replacement   Paroxysmal atrial fibrillation (HCC)   Chronic diastolic CHF (congestive heart failure) (HCC)   Chronic pain syndrome   HTN (hypertension)   History of CVA  (cerebrovascular accident)   Osteoporosis   History of TIA (transient ischemic attack)   Mild dementia ([place])   Aphasia    #) Aphasia/acute encephalopathy: Seems most consistent with a TIA as patient is subtherapeutic on her INR.  CTA shows no evidence of clinically significant stenosis suggesting a cardioembolic source. -N.p.o. -IV fluids -Telemetry -Hold home  antihypertensives with permissive hypertension -MRI brain ordered -Speech-language pathology consult and PT consult -Echo ordered  #) Paroxysmal atrial fibrillation on warfarin: - Restart home warfarin, does not need bridge - Continue home digoxin 0.125 mcg daily -Continue flecainide 50 mg twice daily -Hold metoprolol succinate 1 mg daily  #) Hypertension/hyperlipidemia/nonobstructive coronary artery disease: -Continue atorvastatin 20 mg daily -Hold a irbesartan 150 mg daily -Hold beta-blocker  #) Dementia: -Continue donepezil 5 mg daily  #) Seizure disorder: -Continue levetiracetam 250 mg twice daily  #) Hypothyroidism: -Continue levothyroxine 50 mcg daily -Order TSH  #) Pain/psych: -Continue mirtazapine 15 mg nightly   Fluids: Gentle IV fluids Electrolytes: Monitor and supplement Nutrition: N.p.o.  Prophylaxis: Enoxaparin  Disposition: Pending TIA work-up  Full code   Cristy Folks MD [facility]  If 7PM-7AM, please contact night-coverage [email or web address] Password [facility]  01/30/2018, 4:15 PM

## 2018-01-30 NOTE — ED Notes (Signed)
ED TO INPATIENT HANDOFF REPORT  Name/Age/Gender [name] 85 y.o. female  Code Status Code Status History    Date Active Date Inactive Code Status Order ID Comments User Context   03/15/2017 1612 03/17/2017 1713 Full Code [id number]  Phillips Grout, MD ED   01/28/2017 0304 01/28/2017 1933 Full Code [id number]  Vianne Bulls, MD ED   11/13/2016 0549 11/13/2016 1704 Full Code [id number]  Jani Gravel, MD Inpatient   06/22/2016 1337 06/22/2016 2104 Full Code [id number]  Troy Sine, MD Inpatient   04/23/2016 1543 04/26/2016 1934 DNR [id number]  Eber Jones, MD Inpatient   04/23/2016 1543 04/23/2016 1543 DNR [id number]  Eber Jones, MD Inpatient   09/29/2015 0215 09/29/2015 2119 Full Code [id number]  Oswald Hillock, MD Inpatient   04/21/2015 1506 04/22/2015 1811 Full Code [id number]  Orvan Falconer, MD ED   01/12/2015 0052 01/16/2015 1512 Full Code [id number]  Edwin Dada, MD Inpatient   11/17/2010 1810 11/20/2010 1310 Full Code [id number]  Marcy Panning, RN Inpatient      Home/SNF/Other Home  Chief Complaint hypertension;possible tia  Level of Care/Admitting Diagnosis ED Disposition    ED Disposition Condition [place]: [facility] [[id number]]  Level of Care: Telemetry [5]  Admit to tele based on following criteria: Other see comments  Comments: TIA  Diagnosis: CVA (cerebral vascular accident) [facility]) [[id number]]  Admitting Physician: Cristy Folks [[id number]]  Attending Physician: Cristy Folks [[id number]]  PT Class (Do Not Modify): Observation [104]  PT Acc Code (Do Not Modify): Observation [10022]       Medical History Past Medical History:  Diagnosis Date  . Anemia   . Atrial fibrillation (HCC)    Paroxysmal, Flecainide therapy  . Calf pain    September, 2012, at rest  . Carotid bruit    Doppler, December, 2009, no abnormality  . Cataract   . Diverticulosis   . GERD (gastroesophageal reflux disease)   . History of  shingles   . HLD (hyperlipidemia)   . HTN (hypertension)   . Hypothyroidism   . Insomnia   . Osteoarthritis   . PONV (postoperative nausea and vomiting)   . Rectal bleeding 2001   diverticulosis and int hemorrhoids on 07/1999 and 02/2010 colonoscopies.  . Stroke ([place])   . Warfarin anticoagulation     Allergies Allergies  Allergen Reactions  . Penicillins Rash and Other (See Comments)    Has patient had a PCN reaction causing immediate rash, facial/tongue/throat swelling, SOB or lightheadedness with hypotension: No Has patient had a PCN reaction causing severe rash involving mucus membranes or skin necrosis: No Has patient had a PCN reaction that required hospitalization No Has patient had a PCN reaction occurring within the last 10 years: No If all of the above answers are "NO", then may proceed with Cephalosporin use.    IV Location/Drains/Wounds Patient Lines/Drains/Airways Status   Active Line/Drains/Airways    Name:   Placement date:   Placement time:   Site:   Days:   Peripheral IV 01/30/18 Left Hand   01/30/18    -    Hand   less than 1   Peripheral IV 01/30/18 Left;Upper Arm   01/30/18    1213    Arm   less than 1   External Urinary Catheter   01/30/18    1245    -   less than 1          Labs/Imaging  Results for orders placed or performed during the hospital encounter of 01/30/18 (from the past 48 hour(s))  Ethanol     Status: None   Collection Time: 01/30/18 12:12 PM  Result Value Ref Range   Alcohol, Ethyl (B) <10 <10 mg/dL    Comment: (NOTE) Lowest detectable limit for serum alcohol is 10 mg/dL. For medical purposes only. Performed at [facility], [place] [address], [place], [place] [address]   Protime-INR     Status: Abnormal   Collection Time: 01/30/18 12:12 PM  Result Value Ref Range   Prothrombin Time 18.9 (H) 11.4 - 15.2 seconds   INR 1.60     Comment: Performed at [facility], [place] [address] [place], [place]  [address]  APTT     Status: Abnormal   Collection Time: 01/30/18 12:12 PM  Result Value Ref Range   aPTT 37 (H) 24 - 36 seconds    Comment:        IF BASELINE aPTT IS ELEVATED, SUGGEST PATIENT RISK ASSESSMENT BE USED TO DETERMINE APPROPRIATE ANTICOAGULANT THERAPY. Performed at [facility], [place] [address], [place], [place] [address]   CBC     Status: None   Collection Time: 01/30/18 12:12 PM  Result Value Ref Range   WBC 7.8 4.0 - 10.5 K/uL   RBC 4.62 3.87 - 5.11 MIL/uL   Hemoglobin 14.0 12.0 - 15.0 g/dL   HCT 44.0 36.0 - 46.0 %   MCV 95.2 80.0 - 100.0 fL   MCH 30.3 26.0 - 34.0 pg   MCHC 31.8 30.0 - 36.0 g/dL   RDW 14.4 11.5 - 15.5 %   Platelets 301 150 - 400 K/uL   nRBC 0.0 0.0 - 0.2 %    Comment: Performed at [facility], [place] [address] [place], [place] [address]  Differential     Status: None   Collection Time: 01/30/18 12:12 PM  Result Value Ref Range   Neutrophils Relative % 69 %   Neutro Abs 5.5 1.7 - 7.7 K/uL   Lymphocytes Relative 20 %   Lymphs Abs 1.5 0.7 - 4.0 K/uL   Monocytes Relative 9 %   Monocytes Absolute 0.7 0.1 - 1.0 K/uL   Eosinophils Relative 0 %   Eosinophils Absolute 0.0 0.0 - 0.5 K/uL   Basophils Relative 1 %   Basophils Absolute 0.0 0.0 - 0.1 K/uL   Immature Granulocytes 1 %   Abs Immature Granulocytes 0.05 0.00 - 0.07 K/uL    Comment: Performed at [facility], [place] [address], [place], [place] [address]  Comprehensive metabolic panel     Status: Abnormal   Collection Time: 01/30/18 12:12 PM  Result Value Ref Range   Sodium 134 (L) 135 - 145 mmol/L   Potassium 4.2 3.5 - 5.1 mmol/L   Chloride 97 (L) 98 - 111 mmol/L   CO2 25 22 - 32 mmol/L   Glucose, Bld 115 (H) 70 - 99 mg/dL   BUN 10 8 - 23 mg/dL   Creatinine, Ser 0.59 0.44 - 1.00 mg/dL   Calcium 8.5 (L) 8.9 - 10.3 mg/dL   Total Protein 7.2 6.5 - 8.1 g/dL   Albumin 3.8 3.5 - 5.0 g/dL   AST 28 15 - 41 U/L   ALT 18 0 - 44 U/L   Alkaline  Phosphatase 54 38 - 126 U/L   Total Bilirubin 1.0 0.3 - 1.2 mg/dL   GFR calc non Af Amer >60 >60 mL/min  GFR calc Af Amer >60 >60 mL/min   Anion gap 12 5 - 15    Comment: Performed at [facility], [place] [address], [place], [place] [address]  Troponin I - ONCE - STAT     Status: None   Collection Time: 01/30/18 12:12 PM  Result Value Ref Range   Troponin I <0.03 <0.03 ng/mL    Comment: Performed at [facility], [place] [address] [place], [place] [address]  Digoxin level     Status: None   Collection Time: 01/30/18  1:09 PM  Result Value Ref Range   Digoxin Level 0.8 0.8 - 2.0 ng/mL    Comment: Performed at [facility], [place] [address] [place], [place] [address]  Ammonia     Status: None   Collection Time: 01/30/18  1:09 PM  Result Value Ref Range   Ammonia 13 9 - 35 umol/L    Comment: Performed at [facility], [place] [address] [place], [place] [address]  Urine rapid drug screen (hosp performed)     Status: None   Collection Time: 01/30/18  1:57 PM  Result Value Ref Range   Opiates NONE DETECTED NONE DETECTED   Cocaine NONE DETECTED NONE DETECTED   Benzodiazepines NONE DETECTED NONE DETECTED   Amphetamines NONE DETECTED NONE DETECTED   Tetrahydrocannabinol NONE DETECTED NONE DETECTED   Barbiturates NONE DETECTED NONE DETECTED    Comment: (NOTE) DRUG SCREEN FOR MEDICAL PURPOSES ONLY.  IF CONFIRMATION IS NEEDED FOR ANY PURPOSE, NOTIFY LAB WITHIN 5 DAYS. LOWEST DETECTABLE LIMITS FOR URINE DRUG SCREEN Drug Class                     Cutoff (ng/mL) Amphetamine and metabolites    1000 Barbiturate and metabolites    200 Benzodiazepine                 967 Tricyclics and metabolites     300 Opiates and metabolites        300 Cocaine and metabolites        300 THC                            50 Performed at [facility], [place] [address], [place], [place] [address]   Urinalysis, Routine w  reflex microscopic     Status: Abnormal   Collection Time: 01/30/18  1:58 PM  Result Value Ref Range   Color, Urine YELLOW YELLOW   APPearance CLEAR CLEAR   Specific Gravity, Urine 1.010 1.005 - 1.030   pH 8.0 5.0 - 8.0   Glucose, UA NEGATIVE NEGATIVE mg/dL   Hgb urine dipstick SMALL (A) NEGATIVE   Bilirubin Urine NEGATIVE NEGATIVE   Ketones, ur 5 (A) NEGATIVE mg/dL   Protein, ur NEGATIVE NEGATIVE mg/dL   Nitrite NEGATIVE NEGATIVE   Leukocytes, UA NEGATIVE NEGATIVE   RBC / HPF 6-10 0 - 5 RBC/hpf   WBC, UA 0-5 0 - 5 WBC/hpf   Bacteria, UA RARE (A) NONE SEEN   Squamous Epithelial / LPF 0-5 0 - 5    Comment: Performed at [facility], [place] [address], [place], [place] [address]   Ct Angio Head W Or Wo Contrast  Result Date: 01/30/2018 CLINICAL DATA:  Code stroke. Sudden onset of aphasia today. Previous CVA. Patient is on warfarin. EXAM: CT ANGIOGRAPHY HEAD AND NECK TECHNIQUE: Multidetector CT imaging of the head and  neck was performed using the standard protocol during bolus administration of intravenous contrast. Multiplanar CT image reconstructions and MIPs were obtained to evaluate the vascular anatomy. Carotid stenosis measurements (when applicable) are obtained utilizing NASCET criteria, using the distal internal carotid diameter as the denominator. CONTRAST:  90mL ISOVUE-370 IOPAMIDOL (ISOVUE-370) INJECTION 76% COMPARISON:  CT head without contrast 12/18/2017. MRI head 03/15/2017. MRA head 03/16/2017. FINDINGS: CTA NECK FINDINGS Aortic arch: A 3 vessel arch configuration is present. Atherosclerotic calcifications present in the distal arch without aneurysm or stenosis. Right carotid system: The right common carotid artery is within normal limits. Atherosclerotic calcifications are present at the right carotid bifurcation. There is no significant stenosis. There is focal tortuosity of the cervical right ICA with a % stenosis relative to the more distal vessel. No significant  stenoses are present. Left carotid system: The left common carotid artery is within normal limits. Left bifurcation is unremarkable. There is moderate tortuosity of the cervical left ICA. A 50% stenosis is present in the mid left ICA relative to the more distal segments. No significant stenosis are present. Vertebral arteries: The vertebral arteries originate from the subclavian arteries bilaterally without significant stenosis. There is some tortuosity. No significant stenosis is present in either vertebral artery in the neck Skeleton: Chronic endplate degenerative changes present at C4-5, C5-6, and C6-7. Osseous foraminal narrowing is worse on the left at these 3 levels. No focal lytic or blastic lesions are present. Other neck: Thyroid is unremarkable. Salivary glands are within normal limits. No significant adenopathy is present. No focal mucosal or submucosal lesions are present. Upper chest: The lung apices are clear without focal nodule, mass, or airspace disease. Thoracic inlet is within normal limits. Review of the MIP images confirms the above findings CTA HEAD FINDINGS Anterior circulation: Atherosclerotic calcifications present within the cavernous internal carotid arteries bilaterally. The A1 and M1 segments are normal. The anterior communicating artery is patent. ACA and MCA branch vessels are normal. MCA bifurcations are unremarkable. Posterior circulation: The vertebral arteries are codominant. PICA origins are visualized and normal. The vertebrobasilar junction is normal. Posterior cerebral arteries originate from the basilar tip bilaterally. Venous sinuses: Dural sinuses are patent. Straight sinus deep cerebral veins are intact. Cortical veins are within normal limits. Anatomic variants: None Delayed phase: Delayed images demonstrate no pathologic enhancement. White matter disease is exaggerated. Review of the MIP images confirms the above findings IMPRESSION: 1. No emergent large vessel occlusion.  2. Diffuse white matter disease. 3. Tortuosity of the cervical internal carotid arteries bilaterally with narrowing of 50% or less relative to the more distal vessels. 4. Atherosclerotic calcifications at the cavernous internal carotid arteries bilaterally without significant stenosis. 5. Degenerative changes in the cervical spine. Electronically Signed   By: San Morelle M.D.   On: 01/30/2018 15:54   Ct Angio Neck W And/or Wo Contrast  Result Date: 01/30/2018 CLINICAL DATA:  Code stroke. Sudden onset of aphasia today. Previous CVA. Patient is on warfarin. EXAM: CT ANGIOGRAPHY HEAD AND NECK TECHNIQUE: Multidetector CT imaging of the head and neck was performed using the standard protocol during bolus administration of intravenous contrast. Multiplanar CT image reconstructions and MIPs were obtained to evaluate the vascular anatomy. Carotid stenosis measurements (when applicable) are obtained utilizing NASCET criteria, using the distal internal carotid diameter as the denominator. CONTRAST:  82mL ISOVUE-370 IOPAMIDOL (ISOVUE-370) INJECTION 76% COMPARISON:  CT head without contrast 12/18/2017. MRI head 03/15/2017. MRA head 03/16/2017. FINDINGS: CTA NECK FINDINGS Aortic arch: A 3 vessel arch configuration is  present. Atherosclerotic calcifications present in the distal arch without aneurysm or stenosis. Right carotid system: The right common carotid artery is within normal limits. Atherosclerotic calcifications are present at the right carotid bifurcation. There is no significant stenosis. There is focal tortuosity of the cervical right ICA with a % stenosis relative to the more distal vessel. No significant stenoses are present. Left carotid system: The left common carotid artery is within normal limits. Left bifurcation is unremarkable. There is moderate tortuosity of the cervical left ICA. A 50% stenosis is present in the mid left ICA relative to the more distal segments. No significant stenosis are  present. Vertebral arteries: The vertebral arteries originate from the subclavian arteries bilaterally without significant stenosis. There is some tortuosity. No significant stenosis is present in either vertebral artery in the neck Skeleton: Chronic endplate degenerative changes present at C4-5, C5-6, and C6-7. Osseous foraminal narrowing is worse on the left at these 3 levels. No focal lytic or blastic lesions are present. Other neck: Thyroid is unremarkable. Salivary glands are within normal limits. No significant adenopathy is present. No focal mucosal or submucosal lesions are present. Upper chest: The lung apices are clear without focal nodule, mass, or airspace disease. Thoracic inlet is within normal limits. Review of the MIP images confirms the above findings CTA HEAD FINDINGS Anterior circulation: Atherosclerotic calcifications present within the cavernous internal carotid arteries bilaterally. The A1 and M1 segments are normal. The anterior communicating artery is patent. ACA and MCA branch vessels are normal. MCA bifurcations are unremarkable. Posterior circulation: The vertebral arteries are codominant. PICA origins are visualized and normal. The vertebrobasilar junction is normal. Posterior cerebral arteries originate from the basilar tip bilaterally. Venous sinuses: Dural sinuses are patent. Straight sinus deep cerebral veins are intact. Cortical veins are within normal limits. Anatomic variants: None Delayed phase: Delayed images demonstrate no pathologic enhancement. White matter disease is exaggerated. Review of the MIP images confirms the above findings IMPRESSION: 1. No emergent large vessel occlusion. 2. Diffuse white matter disease. 3. Tortuosity of the cervical internal carotid arteries bilaterally with narrowing of 50% or less relative to the more distal vessels. 4. Atherosclerotic calcifications at the cavernous internal carotid arteries bilaterally without significant stenosis. 5.  Degenerative changes in the cervical spine. Electronically Signed   By: San Morelle M.D.   On: 01/30/2018 15:54   Ct Head Code Stroke Wo Contrast  Result Date: 01/30/2018 CLINICAL DATA:  Code stroke. Sudden onset of aphasia. Previous CVA. Patient on warfarin. EXAM: CT HEAD WITHOUT CONTRAST TECHNIQUE: Contiguous axial images were obtained from the base of the skull through the vertex without intravenous contrast. COMPARISON:  CT head without contrast 12/18/2017 FINDINGS: Brain: Advanced atrophy and white matter changes are present bilaterally. Acute hemorrhage is present. No acute cortical infarct is present. Basal ganglia are stable. Insular ribbon is intact bilaterally. Brainstem and cerebellum are. Vascular: Blood vessels are uniformly hyperdense. No focal asymmetry is evident. Skull: Calvarium is intact. No focal lytic or blastic lesions are present. Sinuses/Orbits: The paranasal sinuses and mastoid air cells are clear. The globes and orbits are within normal limits. ASPECTS [facility] Stroke Program Early CT Score) - Ganglionic level infarction (caudate, lentiform nuclei, internal capsule, insula, M1-M3 cortex): 7/7 - Supraganglionic infarction (M4-M6 cortex): 3/3 Total score (0-10 with 10 being normal): 10/10 IMPRESSION: 1. No acute intracranial abnormality or significant interval change. 2. Stable advanced atrophy and white matter disease. 3. Blood vessels are uniformly hyperdense without focal asymmetry to suggest thrombus. 4. ASPECTS is 10/10  These results were called by telephone at the time of interpretation on 01/30/2018 at 12:20 pm to Dr. Charlesetta Shanks , who verbally acknowledged these results. Electronically Signed   By: San Morelle M.D.   On: 01/30/2018 12:21   EKG Interpretation  Date/Time:  Monday January 30 2018 11:30:34 EST Ventricular Rate:  131 PR Interval:    QRS Duration: 92 QT Interval:  289 QTC Calculation: 427 R Axis:   -82 Text Interpretation:  Atrial  fibrillation Left anterior fascicular block Anterior infarct, old ST depression, probably rate related no change from previous except increased rate Confirmed by Charlesetta Shanks [id number]) on 01/30/2018 3:33:34 PM   Pending Labs Unresulted Labs (From admission, onward)    Start     Ordered   01/30/18 1248  T3, free  ONCE - STAT,   STAT     01/30/18 1247   01/30/18 1247  Levetiracetam level  Once,   R     01/30/18 1247   Signed and Held  Hemoglobin A1c  Tomorrow morning,   R     Signed and Held   Signed and Held  Lipid panel  Tomorrow morning,   R    Comments:  Fasting    Signed and Held   Signed and Held  TSH  Once,   R     Signed and Held          Vitals/Pain Today's Vitals   01/30/18 1400 01/30/18 1430 01/30/18 1600 01/30/18 1700  BP: (!) 166/109  (!) 158/73 (!) 156/84  Pulse: (!) 156 (!) 132 85 (!) 115  Resp: 19 17 19 20   Temp:      TempSrc:      SpO2: 94% 96% 93% 94%  Weight:      PainSc:        Isolation Precautions No active isolations  Medications Medications  sodium chloride (PF) 0.9 % injection (has no administration in time range)  iopamidol (ISOVUE-370) 76 % injection (has no administration in time range)  iopamidol (ISOVUE-370) 76 % injection 100 mL (80 mLs Intravenous Contrast Given 01/30/18 1447)    Mobility non-ambulatory currently

## 2018-01-30 NOTE — ED Notes (Signed)
Bed: WK46 Expected date:  Expected time:  Means of arrival:  Comments: EMS AMS

## 2018-01-30 NOTE — ED Notes (Addendum)
Pt anxious and restless. Pt pulling at lines and monitoring equipment. MD made aware. Pt continually stating that she needs to use the bathroom. Pt informed that she had the purewick on and she could void at anytime. Pt is confused and not easily redirectable. Son remains at bedside. Sitter order placed.  MD made aware that medication is needed

## 2018-01-30 NOTE — ED Notes (Signed)
Pts family member is becoming increasingly agitated about the pt having to be stuck again for more labs. Pts son informed that the pt will have to be stuck at least daily for lab work and that she has only been stuck 2 times for IV access with success both times.

## 2018-01-30 NOTE — ED Notes (Signed)
Phlebotomy notified need for labs to be collected.

## 2018-01-31 ENCOUNTER — Ambulatory Visit: Payer: Self-pay | Admitting: Licensed Clinical Social Worker

## 2018-01-31 ENCOUNTER — Observation Stay (HOSPITAL_BASED_OUTPATIENT_CLINIC_OR_DEPARTMENT_OTHER): Payer: Medicare Other

## 2018-01-31 ENCOUNTER — Observation Stay (HOSPITAL_COMMUNITY)
Admission: EM | Admit: 2018-01-31 | Discharge: 2018-01-31 | Disposition: A | Discharge status: Home / Self Care | Payer: Medicare Other | Source: Home / Self Care | Attending: Neurology | Admitting: Neurology

## 2018-01-31 DIAGNOSIS — F03A0 Unspecified dementia, mild, without behavioral disturbance, psychotic disturbance, mood disturbance, and anxiety: Secondary | ICD-10-CM

## 2018-01-31 DIAGNOSIS — G459 Transient cerebral ischemic attack, unspecified: Secondary | ICD-10-CM | POA: Diagnosis not present

## 2018-01-31 DIAGNOSIS — F0390 Unspecified dementia without behavioral disturbance: Secondary | ICD-10-CM

## 2018-01-31 DIAGNOSIS — F4489 Other dissociative and conversion disorders: Secondary | ICD-10-CM | POA: Diagnosis not present

## 2018-01-31 DIAGNOSIS — I639 Cerebral infarction, unspecified: Secondary | ICD-10-CM | POA: Diagnosis not present

## 2018-01-31 DIAGNOSIS — I10 Essential (primary) hypertension: Secondary | ICD-10-CM

## 2018-01-31 DIAGNOSIS — R4701 Aphasia: Secondary | ICD-10-CM | POA: Diagnosis not present

## 2018-01-31 LAB — LIPID PANEL
Cholesterol: 144 mg/dL (ref 0–200)
HDL: 70 mg/dL (ref >40)
LDL Cholesterol: 64 mg/dL (ref 0–99)
Total CHOL/HDL Ratio: 2.1 RATIO
Triglycerides: 52 mg/dL (ref <150)
VLDL: 10 mg/dL (ref 0–40)

## 2018-01-31 LAB — HEMOGLOBIN A1C
Hgb A1c MFr Bld: 5.9 % — ABNORMAL HIGH (ref 4.8–5.6)
Mean Plasma Glucose: 122.63 mg/dL

## 2018-01-31 LAB — T3, FREE: T3, Free: 3.3 pg/mL (ref 2.0–4.4)

## 2018-01-31 LAB — LEVETIRACETAM LEVEL: Levetiracetam Lvl: 9.9 ug/mL — ABNORMAL LOW (ref 10.0–40.0)

## 2018-01-31 LAB — PROTIME-INR
INR: 1.44
Prothrombin Time: 17.4 seconds — ABNORMAL HIGH (ref 11.4–15.2)

## 2018-01-31 LAB — ECHOCARDIOGRAM COMPLETE: Weight: 2081.14 oz

## 2018-01-31 LAB — GLUCOSE, CAPILLARY: Glucose-Capillary: 106 mg/dL — ABNORMAL HIGH (ref 70–99)

## 2018-01-31 MED ORDER — POLYETHYLENE GLYCOL 3350 17 G PO PACK: 17.0000 g | PACK | Freq: Every day | ORAL | Status: DC | PRN

## 2018-01-31 MED ORDER — APIXABAN 5 MG PO TABS
5.0000 mg | ORAL_TABLET | Freq: Two times a day (BID) | ORAL | Status: DC
  Administered 2018-01-31 – 2018-02-01 (×3): 5 mg via ORAL
  Filled 2018-01-31 (×3): qty 1

## 2018-01-31 NOTE — Discharge Instructions (Signed)

## 2018-01-31 NOTE — Progress Notes (Signed)
  Echocardiogram 2D Echocardiogram has been performed.  Jannett Celestine 01/31/2018, 1:48 PM

## 2018-01-31 NOTE — Progress Notes (Signed)
Patient ambulated to nurses' station and back with PT.  Patient became nauseous on return and vomited.  Compazine given.  Patient reports nausea is relieved.  Patient resting in char.  PT with patient.

## 2018-01-31 NOTE — Progress Notes (Signed)
OT Cancellation Note  Patient Details Name: [name] MRN: [id number] DOB: 10/21/1932   Cancelled Treatment:    Reason Eval/Treat Not Completed: Other (comment). Pt was sleeping soundly. Will check back tomorrow.  Raechel Marcos 01/31/2018, 3:24 PM  Lesle Chris, OTR/L Acute Rehabilitation Services [phone] [facility] pager [phone] office 01/31/2018

## 2018-01-31 NOTE — Care Management Note (Signed)
Case Management Note  Patient Details  Name: [name] MRN: [id number] Date of Birth: September 07, 1932  Subjective/Objective:  From home. PT recc SNF. TC Son Ray left vm [phone]-await call back to  Discuss d/c plan.                  Action/Plan:dc SNF.   Expected Discharge Date:  (unknown)               Expected Discharge Plan:  Skilled Nursing Facility  In-House Referral:  Clinical Social Work  Discharge planning Services  CM Consult  Post Acute Care Choice:    Choice offered to:     DME Arranged:    DME Agency:     HH Arranged:    [place] Agency:     Status of Service:  In process, will continue to follow  If discussed at Long Length of Stay Meetings, dates discussed:    Additional Comments:  Dessa Phi, RN 01/31/2018, 1:42 PM

## 2018-01-31 NOTE — Procedures (Signed)
ELECTROENCEPHALOGRAM REPORT   Patient: [name]       Room #: 0786 EEG No. ID: [id number] Age: 85 y.o.        Sex: female Referring Physician: Purohit Report Date:  01/31/2018        Interpreting Physician: Alexis Goodell  History: [name] is an 85 y.o. female with altered mental status  Medications:  Eliquis, Lipitor, Lanoxin, Aricept, Colace, Tambocor, Keppra, Synthroid, Remeron, Protonix  Conditions of Recording:  This is a 21 channel routine scalp EEG performed with bipolar and monopolar montages arranged in accordance to the international 10/20 system of electrode placement. One channel was dedicated to EKG recording.  The patient is in the awake, drowsy and asleep state.Marland Kitchen  Description:  The patient is asleep for the majority of the recording with a slow and irregular background noted and superimposed symmetrical sleep spindles along with vertex central sharp transients.  The patient is alerted towards the end of the recording.  Although there is a preponderance of artifact during this short period of wakefulness a posterior background rhythm can be identified the briefly achieves alpha.  The patient then quickly returns to drowse with an irregular, slow background noted.   No epileptiform activity is noted.   Hyperventilation and iIntermittent photic stimulation were not performed.   IMPRESSION: This is a normal awake, drowsy and asleep electroencephalogram.  No epileptiform activity is noted.     Alexis Goodell, MD Neurology [phone] 01/31/2018, 6:20 PM

## 2018-01-31 NOTE — Progress Notes (Signed)
Received return call from Ray-he agrees t SNF prefers [facility]-CSW notified.

## 2018-01-31 NOTE — Progress Notes (Deleted)
HPI The patient has a history of atrial fibrillation and premature contractions.  Since I last saw her her husband died.  She lives by herself.   Her neighbor checks on her routinely and says that her memory has gotten worse since her husband died.  She actually does not feel well either.  She has had increased fatigue.  She had problems with her abdomen as well as been diagnosed with diverticulitis and was in the emergency room at [facility].  I did review these records.  I note that for multiple readings going back to September her heart rates been in the 120s.  I do see one EKG from October when she was in atrial fibrillation with a rapid rate.  There is no record that she has been in atrial fibrillation.  She typically has felt her fibrillation but she turns out to be in fibrillation today.  She does not really notice this.  She is not having any presyncope or syncope.  She is not having any chest pressure, neck or arm discomfort.  She has had no weight gain or edema.    Allergies  Allergen Reactions  . Penicillins Rash and Other (See Comments)    Has patient had a PCN reaction causing immediate rash, facial/tongue/throat swelling, SOB or lightheadedness with hypotension: No Has patient had a PCN reaction causing severe rash involving mucus membranes or skin necrosis: No Has patient had a PCN reaction that required hospitalization No Has patient had a PCN reaction occurring within the last 10 years: No If all of the above answers are "NO", then may proceed with Cephalosporin use.    No current facility-administered medications for this visit.    No current outpatient medications on file.   Facility-Administered Medications Ordered in Other Visits  Medication Dose Route Frequency Provider Last Rate Last Dose  .  stroke: mapping our early stages of recovery book   Does not apply Once Purohit, Shrey C, MD      . 0.9 %  sodium chloride infusion   Intravenous Continuous Purohit,  Konrad Dolores, MD 10 mL/hr at 01/31/18 1043    . acetaminophen (TYLENOL) tablet 650 mg  650 mg Oral Q4H PRN Purohit, Shrey C, MD       Or  . acetaminophen (TYLENOL) solution 650 mg  650 mg Per Tube Q4H PRN Purohit, Shrey C, MD       Or  . acetaminophen (TYLENOL) suppository 650 mg  650 mg Rectal Q4H PRN Purohit, Shrey C, MD      . apixaban (ELIQUIS) tablet 5 mg  5 mg Oral BID Purohit, Shrey C, MD   5 mg at 01/31/18 1316  . atorvastatin (LIPITOR) tablet 20 mg  20 mg Oral QHS Purohit, Shrey C, MD   20 mg at 01/30/18 2118  . digoxin (LANOXIN) tablet 0.125 mg  0.125 mg Oral Daily Purohit, Shrey C, MD   0.125 mg at 01/31/18 1315  . docusate sodium (COLACE) capsule 100 mg  100 mg Oral Daily Purohit, Shrey C, MD      . donepezil (ARICEPT) tablet 5 mg  5 mg Oral QHS Purohit, Shrey C, MD   5 mg at 01/30/18 2117  . flecainide (TAMBOCOR) tablet 50 mg  50 mg Oral Q12H Purohit, Shrey C, MD   50 mg at 01/31/18 1316  . levETIRAcetam (KEPPRA) tablet 250 mg  250 mg Oral BID Purohit, Shrey C, MD   250 mg at 01/31/18 1315  . levothyroxine (SYNTHROID,  LEVOTHROID) tablet 50 mcg  50 mcg Oral Q0600 Cristy Folks, MD   50 mcg at 01/31/18 0549  . magnesium hydroxide (MILK OF MAGNESIA) suspension 15 mL  15 mL Oral Daily PRN Purohit, Shrey C, MD      . mirtazapine (REMERON) tablet 15 mg  15 mg Oral QHS Purohit, Shrey C, MD   15 mg at 01/30/18 2117  . pantoprazole (PROTONIX) EC tablet 40 mg  40 mg Oral Daily Purohit, Shrey C, MD   40 mg at 01/31/18 1317  . polyethylene glycol (MIRALAX / GLYCOLAX) packet 17 g  17 g Oral Daily PRN Purohit, Shrey C, MD      . prochlorperazine (COMPAZINE) injection 5 mg  5 mg Intravenous Q6H PRN Kirby-Graham, Karsten Fells, NP   5 mg at 01/31/18 1137  . traMADol (ULTRAM) tablet 50 mg  50 mg Oral Q6H PRN Purohit, Konrad Dolores, MD        Past Medical History:  Diagnosis Date  . Anemia   . Atrial fibrillation (HCC)    Paroxysmal, Flecainide therapy  . Calf pain    September, 2012, at rest  . Carotid  bruit    Doppler, December, 2009, no abnormality  . Cataract   . Diverticulosis   . GERD (gastroesophageal reflux disease)   . History of shingles   . HLD (hyperlipidemia)   . HTN (hypertension)   . Hypothyroidism   . Insomnia   . Osteoarthritis   . PONV (postoperative nausea and vomiting)   . Rectal bleeding 2001   diverticulosis and int hemorrhoids on 07/1999 and 02/2010 colonoscopies.  . Stroke ([place])   . Warfarin anticoagulation     Past Surgical History:  Procedure Laterality Date  . BIOPSY  09/29/2015   Procedure: BIOPSY;  Surgeon: Rogene Houston, MD;  [facility];  Service: Endoscopy;;  gastric  . CATARACT EXTRACTION W/PHACO Right 04/15/2014   Procedure: CATARACT EXTRACTION PHACO AND INTRAOCULAR LENS PLACEMENT (IOC);  Surgeon: Tonny Branch, MD;  Location: [facility];  Service: Ophthalmology;  Laterality: Right;  CDE: 13.30  . CATARACT EXTRACTION W/PHACO Left 05/13/2014   Procedure: CATARACT EXTRACTION PHACO AND INTRAOCULAR LENS PLACEMENT LEFT EYE;  Surgeon: Tonny Branch, MD;  Location: [facility];  Service: Ophthalmology;  Laterality: Left;  CDE:13.00  . CHOLECYSTECTOMY N/A 01/15/2015   Procedure: LAPAROSCOPIC CHOLECYSTECTOMY;  Surgeon: Ralene Ok, MD;  Location: [place];  Service: General;  Laterality: N/A;  . COLONOSCOPY N/A 09/29/2015   Procedure: COLONOSCOPY;  Surgeon: Rogene Houston, MD;  [facility];  Service: Endoscopy;  Laterality: N/A;  . COLONOSCOPY    . ESOPHAGOGASTRODUODENOSCOPY N/A 01/13/2015   Procedure: ESOPHAGOGASTRODUODENOSCOPY (EGD);  Surgeon: Jerene Bears, MD;  Location[facility];  Service: Endoscopy;  Laterality: N/A;  . ESOPHAGOGASTRODUODENOSCOPY N/A 09/29/2015   Procedure: ESOPHAGOGASTRODUODENOSCOPY (EGD);  Surgeon: Rogene Houston, MD;  [facility];  Service: Endoscopy;  Laterality: N/A;  12:15  . GALLBLADDER SURGERY    . LEFT HEART CATH AND CORONARY ANGIOGRAPHY N/A 06/22/2016   Procedure: Left Heart Cath and Coronary Angiography;   Surgeon: Troy Sine, MD;  Location: [place] [facility];  Service: Cardiovascular;  Laterality: N/A;  . SHOULDER OPEN ROTATOR CUFF REPAIR     bilateral  . TOTAL KNEE ARTHROPLASTY  05/26/10   right  . TOTAL KNEE ARTHROPLASTY  11/17/2010   Procedure: TOTAL KNEE ARTHROPLASTY;  Surgeon: Mauri Pole;  Location: [facility];  Service: Orthopedics;  Laterality: Left;  . TOTAL  KNEE ARTHROPLASTY     Right  . TOTAL SHOULDER REPLACEMENT  01/2010   left    ROS:    Positive for decreased memory and balance problems. Otherwise as stated in the HPI and negative for all other systems.  PHYSICAL EXAM There were no vitals taken for this visit.  GENERAL: Frail appearing NECK:  No jugular venous distention, waveform within normal limits, carotid upstroke brisk and symmetric, no bruits, no thyromegaly LUNGS:  Clear to auscultation bilaterally CHEST:  Unremarkable HEART:  PMI not displaced or sustained,S1 and S2 within normal limits, no S3,  no clicks, no rubs, no murmurs, irregular ABD:  Flat, positive bowel sounds normal in frequency in pitch, no bruits, no rebound, no guarding, no midline pulsatile mass, no hepatomegaly, no splenomegaly EXT:  2 plus pulses throughout, no edema, no cyanosis no clubbing   EKG: Atrial fibrillation, rate 121, axis within normal limits, QTC prolonged, poor anterior R wave progression.  ASSESSMENT AND PLAN   ATRIAL FIB/FLUTTER:    Ms. ROSAELENA KEMNITZ has a CHA2DS2 - VASc score of 6.  It looks like she has been in this rhythm persistently probably since September when her heart rate was noted to be higher.  I suspect this is persistent now and I am going to check a 48-hour Holter to make sure of that.  I sent a note to her pharmacist who is checking her INR today.  Provided she is therapeutic I would plan to bring her back for a cardioversion.  Her blood pressure will not allow up titration of other rate control medications so many give her digoxin 0.125 mg daily.  This can be  held on the morning of cardioversion.     HTN:  The blood pressure is running low which precludes titration of any AV nodal blocking agents.    (Greater than 40 minutes reviewing all data with greater than 50% face to face with the patient).

## 2018-01-31 NOTE — Progress Notes (Signed)
Subjective: Patient seems more aphasic to me today, but her son states that she seems to be improving since he got here  Exam: Vitals:   01/31/18 0601 01/31/18 1021  BP: (!) 155/80 (!) 176/87  Pulse: 75 100  Resp: 15 18  Temp: 98.9 F (37.2 C) 98 F (36.7 C)  SpO2: 94% 96%   Gen: In bed, NAD Resp: non-labored breathing, no acute distress Abd: soft, nt  Neuro: MS: Awake, significant difficulty with naming today, not able to repeat CN: She fixates and tracks me around the room, crosses midline to both directions Motor: She moves all extremities well Sensory: Intact light touch  Pertinent Labs: TSH-normal, ammonia-normal  I have reviewed the images MRI brain- no acute findings  Impression: 85 year old female with altered mental status.  Today, her deficit seems more like aphasia than delirium, and this is what was described earlier in the day yesterday, but not what I saw when I examined her.  This does make me wonder if she has been waxing and waning.  I would favor getting an EEG, though this still could be hypoactive delirium.  Recommendations: 1) EEG 2) continue Keppra 250 twice daily 3) neurology will continue to follow  Roland Rack, MD [facility] [phone]  If 7pm- 7am, please page neurology on call as listed in [place].

## 2018-01-31 NOTE — Progress Notes (Addendum)
Physical Therapy Evaluation Patient Details Name: [name] MRN: [id number] DOB: 04-20-1932 Today's Date: 01/31/2018   History of Present Illness  85 y.o. female admitted from home for confusion and possible aphaisa.  CT and MRI were negative for stroke. Neurology following and EEG pending.  Pt with significant PMH of PAF (on warfarin), TIAs, dementia, diastolic heart failure, CAD, HTN, seizure disorder, anemia, L TSA, bil TKA, and R shoulder RTC repair.    Clinical Impression  Pt became nauseated and vomited while walking requiring min to mod assist for all mobility.  She reported being dizzy only after returning to the room and son reported she vomited yesterday as well.  HR ranged from 98-161 (161 while vomiting).  Attempted to do some basic vestibular testing, however, pt had difficulty with following/seqencing through what I was asking her to do.  No obvious signs of vestibular impairment (saccadic smooth persuits (considered normal for her age), HIT difficult due to inability to relax), and no reports of dizziness with gaze stability x1 testing.  I did not do a roll test or posterior canal testing).  Pt would benefit from post acute rehab after discharge as she is in need of a higher level of care currently.   PT to follow acutely for deficits listed below.    Follow Up Recommendations SNF(in [place] [employer]    Equipment Recommendations  None recommended by PT    Recommendations for Other Services   NA    Precautions / Restrictions Precautions Precautions: Fall Precaution Comments: dizzy and wobbly on her feet      Mobility  Bed Mobility Overal bed mobility: Needs Assistance Bed Mobility: Supine to Sit     Supine to sit: Mod assist     General bed mobility comments: Mod assist from flat bed.  When asked to sit up on the side of the bed, pt had significant difficulty sequencing how to move to the side and needed help to move her legs over and support her  trunk to come up to sitting.   Transfers Overall transfer level: Needs assistance   Transfers: Sit to/from Stand Sit to Stand: Min assist         General transfer comment: Min hand held assist, free hand reaching for object to stabilize.  Ambulation/Gait Ambulation/Gait assistance: Min assist Gait Distance (Feet): 85 Feet Assistive device: 1 person hand held assist(free hand reaching for any object she could to stabilize) Gait Pattern/deviations: Step-through pattern;Staggering right;Shuffle Gait velocity: decreased Gait velocity interpretation: <1.8 ft/sec, indicate of risk for recurrent falls General Gait Details: Pt was oddly turned to the right, reaching for things with her free right hand and supported on her left side by PT's hand.  About half way during walking pt started burping and ended up getting nauseated.  Once back to the room she reports she is dizzy.  She vomited x 1 and RN gave IV pain meds.  HR ranges from 98-130s at rest and went up to 161 while vomiting.           Balance Overall balance assessment: Needs assistance Sitting-balance support: Feet supported;No upper extremity supported Sitting balance-Leahy Scale: Fair Sitting balance - Comments: pt able to donn one, but not both socks seated at the edge of the chair.  Close supervision for safety.    Standing balance support: Bilateral upper extremity supported Standing balance-Leahy Scale: Poor Standing balance comment: needs external support in standing.          01/31/18 1242  Vestibular Assessment  General Observation Pt wears glasses at baseline, no recent head trauma, is HOH at baseline, dementia, dizziness when up and moving.  Symptom Behavior  Type of Dizziness Imbalance  Frequency of Dizziness when up and moving  Duration of Dizziness long  Aggravating Factors Activity in general (walking in the hallway)  Relieving Factors Rest  Occulomotor Exam  Occulomotor Alignment Normal  Spontaneous  Absent  Gaze-induced Absent  Smooth Pursuits Saccades  Comment HIT difficult due to guarding  Vestibulo-Occular Reflex  VOR 1 Head Only (x 1 viewing) no reports of dizziness, therapist had to manually assist with pt moving her head in the correct direction                          Pertinent Vitals/Pain Pain Assessment: No/denies pain    Home Living Family/patient expects to be discharged to:: Private residence Living Arrangements: Other relatives(81 y.o. sister) Available Help at Discharge: Family;Available 24 hours/day(for supervision) Type of Home: House Home Access: Stairs to enter Entrance Stairs-Rails: None Entrance Stairs-Number of Steps: 1 Home Layout: One level Home Equipment: Walker - 2 wheels;Shower seat;Grab bars - tub/shower;Cane - single point      Prior Function Level of Independence: Needs assistance   Gait / Transfers Assistance Needed: pt furniture walks in the house, brings the cane for community ambulation  ADL's / Homemaking Assistance Needed: sister cooks, cleans, and manages her medication.  Pt sponge bathes.        Hand Dominance   Dominant Hand: Right    Extremity/Trunk Assessment   Upper Extremity Assessment Upper Extremity Assessment: Generalized weakness    Lower Extremity Assessment Lower Extremity Assessment: Generalized weakness    Cervical / Trunk Assessment Cervical / Trunk Assessment: Kyphotic  Communication   Communication: HOH  Cognition Arousal/Alertness: Awake/alert Behavior During Therapy: WFL for tasks assessed/performed Overall Cognitive Status: Impaired/Different from baseline Area of Impairment: Orientation;Attention;Memory;Following commands;Safety/judgement;Awareness;Problem solving                 Orientation Level: Disoriented to;Place;Time;Situation(recognizes her son Ray) Current Attention Level: Sustained Memory: Decreased short-term memory;Decreased recall of precautions Following Commands:  Follows one step commands with increased time Safety/Judgement: Decreased awareness of safety;Decreased awareness of deficits Awareness: Intellectual Problem Solving: Slow processing;Decreased initiation;Requires verbal cues;Requires tactile cues;Difficulty sequencing General Comments: Pt is HOH with baseline dementia and she is in an unfamiliar environment.  She is able to tell me her name and her son's name, but has a hard time with any of the history or knowing where she is currently.  She could not even tell me that she lives with her sister.                Assessment/Plan    PT Assessment Patient needs continued PT services  PT Problem List Decreased activity tolerance;Decreased balance;Decreased mobility;Decreased cognition;Decreased knowledge of use of DME;Decreased safety awareness;Decreased knowledge of precautions;Cardiopulmonary status limiting activity       PT Treatment Interventions DME instruction;Gait training;Stair training;Functional mobility training;Therapeutic exercise;Therapeutic activities;Balance training;Neuromuscular re-education;Cognitive remediation;Patient/family education    PT Goals (Current goals can be found in the Care Plan section)  Acute Rehab PT Goals Patient Stated Goal: so wants her to be safe, pt unable to report goal PT Goal Formulation: With family Time For Goal Achievement: 02/14/18 Potential to Achieve Goals: Good    Frequency Min 3X/week   Barriers to discharge Decreased caregiver support her primary cargiver is 83 y.o.  AM-PAC PT "6 Clicks" Mobility  Outcome Measure Help needed turning from your back to your side while in a flat bed without using bedrails?: A Lot Help needed moving from lying on your back to sitting on the side of a flat bed without using bedrails?: A Lot Help needed moving to and from a bed to a chair (including a wheelchair)?: A Little Help needed standing up from a chair using your arms (e.g., wheelchair or  bedside chair)?: A Little Help needed to walk in hospital room?: A Little Help needed climbing 3-5 steps with a railing? : A Little 6 Click Score: 16    End of Session Equipment Utilized During Treatment: Gait belt Activity Tolerance: Other (comment)(limited by nausea) Patient left: in chair;with call bell/phone within reach;with chair alarm set;with family/visitor present Nurse Communication: Mobility status PT Visit Diagnosis: Muscle weakness (generalized) (M62.81);Difficulty in walking, not elsewhere classified (R26.2);Dizziness and giddiness (R42);Other symptoms and signs involving the nervous system (T24.580)    Time: 9983-3825 PT Time Calculation (min) (ACUTE ONLY): 55 min   Charges:           Wells Guiles B. Christhoper Busbee, PT, DPT  Acute Rehabilitation [phone] pager #[phone] office   PT Evaluation $PT Eval Moderate Complexity: 1 Mod PT Treatments $Gait Training: 8-22 mins $Therapeutic Activity: 23-37 mins      01/31/2018, 12:38 PM

## 2018-01-31 NOTE — Progress Notes (Signed)
SLP Cancellation Note  Patient Details Name: [name] MRN: [id number] DOB: 04/22/1932   Cancelled treatment:       Reason Eval/Treat Not Completed: Other (comment)(pt eating her meal at this time, will continue efforts)   Macario Golds 01/31/2018, 12:22 PM  Luanna Salk, [place] SLP [place] Pager [phone] Office [phone]

## 2018-01-31 NOTE — Patient Instructions (Signed)
[name] was given information about Chronic Care Management services today including:  1. CCM service includes personalized support from designated clinical staff supervised by her physician, including individualized plan of care and coordination with other care providers 2. 24/7 contact phone numbers for assistance for urgent and routine care needs. 3. Service will only be billed when office clinical staff spend 20 minutes or more in a month to coordinate care. 4. Only one practitioner may furnish and bill the service in a calendar month. 5. The patient may stop CCM services at any time (effective at the end of the month) by phone call to the office staff. 6. The patient will be responsible for cost sharing (co-pay) of up to 20% of the service fee (after annual deductible is met).  Patient did not agree to services and does not wish to consider at this time.   The patient verbalized understanding of instructions provided today and declined a print copy of patient instruction materials.   [name] MSW, LCSW Licensed Clinical Social Worker [facility] Care Management [phone]

## 2018-01-31 NOTE — Progress Notes (Signed)
EEG completed by Kathrynn Ducking. Results pending.

## 2018-01-31 NOTE — Progress Notes (Signed)
PROGRESS NOTE    [name]  OHY:[id number] DOB: 06-23-1932 DOA: 01/30/2018 PCP: Dettinger, Fransisca Kaufmann, MD   Brief Narrative:  85 y.o. female with medical history significant of paroxysmal atrial fibrillation/flutter on warfarin anticoagulation, history of TIAs, dementia, chronic diastolic grade 1 heart failure (03/16/2017 echo), nonobstructive coronary artery disease by catheterization on 06/22/2016, hyperlipidemia, hypertension, seizure disorder, Hypothyroidism who presents from home with confusion and possible aphasia concerning for stroke.     Assessment & Plan:   Active Problems:   Warfarin anticoagulation   Hypothyroidism   S/P knee replacement   Paroxysmal atrial fibrillation (HCC)   Chronic diastolic CHF (congestive heart failure) (HCC)   Chronic pain syndrome   HTN (hypertension)   History of CVA (cerebrovascular accident)   Osteoporosis   History of TIA (transient ischemic attack)   Mild dementia ([place])   Aphasia   CVA (cerebral vascular accident) ([place])   #) Aphasia/acute encephalopathy: This seems to be improving somewhat though it is unclear if this is a TIA from a subtherapeutic INR versus possible seizure -Neurology consulted, appreciate recommendations -EEG pending -Telemetry -MRI brain shows no evidence of stroke -PT consult pending -Echo 01/31/2018 pending  #) Seizure disorder: -Continue levetiracetam 250 mg twice daily -Pending EEG   #) Paroxysmal atrial fibrillation on warfarin: - Discontinue warfarin, start apixaban 5 mg twice daily - Continue home digoxin 0.125 mcg daily -Continue flecainide 50 mg twice daily -Restart metoprolol succinate 1 mg daily  #) Hypertension/hyperlipidemia/nonobstructive coronary artery disease: -Continue atorvastatin 20 mg daily -Restart a irbesartan 150 mg daily -Restart beta-blocker  #) Dementia: -Continue donepezil 5 mg daily   #) Hypothyroidism: -Continue levothyroxine 50 mcg daily -TSH normal  #)  Pain/psych: -Continue mirtazapine 15 mg nightly   Fluids: Tolerating p.o. Electrolytes: Monitor and supplement Nutrition: Regular diet  Prophylaxis: Enoxaparin  Disposition: Pending physical therapy evaluation and EEG  Full code      Consultants:   Neurology  Procedures:   EEG 01/31/2018 pending  Echo 01/31/2018 pending  Antimicrobials:   None   Subjective: This morning patient reports she is doing well.  She does not have any complaints.  On discussion with the patient's son he reports that she continues to have some aphasia but it is improving somewhat.  She denies any nausea, vomiting, diarrhea, cough, congestion, rhinorrhea.  Objective: Vitals:   01/30/18 1844 01/30/18 2108 01/31/18 0601 01/31/18 1021  BP: (!) 159/121 (!) 151/79 (!) 155/80 (!) 176/87  Pulse: 93 79 75 100  Resp: 16  15 18   Temp: 98.1 F (36.7 C) 98.3 F (36.8 C) 98.9 F (37.2 C) 98 F (36.7 C)  TempSrc: Oral Oral Oral Oral  SpO2: 95% 95% 94% 96%  Weight:        Intake/Output Summary (Last 24 hours) at 01/31/2018 1151 Last data filed at 01/31/2018 9485 Gross per 24 hour  Intake 702.05 ml  Output -  Net 702.05 ml   Filed Weights   01/30/18 1125  Weight: 59 kg    Examination:  General exam: Appears calm and comfortable  Respiratory system: Clear to auscultation. Respiratory effort normal. Cardiovascular system: Irregularly irregular, no murmurs. Gastrointestinal system: Abdomen is nondistended, soft and nontender. No organomegaly or masses felt. Normal bowel sounds heard. Central nervous system: Alert and oriented to person, place but not to time or situationCranial nerves II through XII intact, able to move all 4 extremities with 5 out of 5 strength but very difficult to follow commands, Extremities: No lower extremity edema Skin: No  rashes over visible skin Psychiatry: Unable to assess due to underlying medical condition    Data Reviewed: I have personally reviewed  following labs and imaging studies  CBC: Recent Labs  Lab 01/30/18 1212  WBC 7.8  NEUTROABS 5.5  HGB 14.0  HCT 44.0  MCV 95.2  PLT 353   Basic Metabolic Panel: Recent Labs  Lab 01/30/18 1212  NA 134*  K 4.2  CL 97*  CO2 25  GLUCOSE 115*  BUN 10  CREATININE 0.59  CALCIUM 8.5*   GFR: Estimated Creatinine Clearance: 44.1 mL/min (by C-G formula based on SCr of 0.59 mg/dL). Liver Function Tests: Recent Labs  Lab 01/30/18 1212  AST 28  ALT 18  ALKPHOS 54  BILITOT 1.0  PROT 7.2  ALBUMIN 3.8   No results for input(s): LIPASE, AMYLASE in the last 168 hours. Recent Labs  Lab 01/30/18 1309  AMMONIA 13   Coagulation Profile: Recent Labs  Lab 01/30/18 1212 01/31/18 0557  INR 1.60 1.44   Cardiac Enzymes: Recent Labs  Lab 01/30/18 1212  TROPONINI <0.03   BNP (last 3 results) No results for input(s): PROBNP in the last 8760 hours. HbA1C: Recent Labs    01/31/18 0557  HGBA1C 5.9*   CBG: No results for input(s): GLUCAP in the last 168 hours. Lipid Profile: Recent Labs    01/31/18 0557  CHOL 144  HDL 70  LDLCALC 64  TRIG 52  CHOLHDL 2.1   Thyroid Function Tests: Recent Labs    01/30/18 1309 01/30/18 1906  TSH  --  3.101  T3FREE 3.3  --    Anemia Panel: No results for input(s): VITAMINB12, FOLATE, FERRITIN, TIBC, IRON, RETICCTPCT in the last 72 hours. Sepsis Labs: No results for input(s): PROCALCITON, LATICACIDVEN in the last 168 hours.  No results found for this or any previous visit (from the past 240 hour(s)).       Radiology Studies: Ct Angio Head W Or Wo Contrast  Result Date: 01/30/2018 CLINICAL DATA:  Code stroke. Sudden onset of aphasia today. Previous CVA. Patient is on warfarin. EXAM: CT ANGIOGRAPHY HEAD AND NECK TECHNIQUE: Multidetector CT imaging of the head and neck was performed using the standard protocol during bolus administration of intravenous contrast. Multiplanar CT image reconstructions and MIPs were obtained to  evaluate the vascular anatomy. Carotid stenosis measurements (when applicable) are obtained utilizing NASCET criteria, using the distal internal carotid diameter as the denominator. CONTRAST:  60mL ISOVUE-370 IOPAMIDOL (ISOVUE-370) INJECTION 76% COMPARISON:  CT head without contrast 12/18/2017. MRI head 03/15/2017. MRA head 03/16/2017. FINDINGS: CTA NECK FINDINGS Aortic arch: A 3 vessel arch configuration is present. Atherosclerotic calcifications present in the distal arch without aneurysm or stenosis. Right carotid system: The right common carotid artery is within normal limits. Atherosclerotic calcifications are present at the right carotid bifurcation. There is no significant stenosis. There is focal tortuosity of the cervical right ICA with a % stenosis relative to the more distal vessel. No significant stenoses are present. Left carotid system: The left common carotid artery is within normal limits. Left bifurcation is unremarkable. There is moderate tortuosity of the cervical left ICA. A 50% stenosis is present in the mid left ICA relative to the more distal segments. No significant stenosis are present. Vertebral arteries: The vertebral arteries originate from the subclavian arteries bilaterally without significant stenosis. There is some tortuosity. No significant stenosis is present in either vertebral artery in the neck Skeleton: Chronic endplate degenerative changes present at C4-5, C5-6, and C6-7. Osseous foraminal  narrowing is worse on the left at these 3 levels. No focal lytic or blastic lesions are present. Other neck: Thyroid is unremarkable. Salivary glands are within normal limits. No significant adenopathy is present. No focal mucosal or submucosal lesions are present. Upper chest: The lung apices are clear without focal nodule, mass, or airspace disease. Thoracic inlet is within normal limits. Review of the MIP images confirms the above findings CTA HEAD FINDINGS Anterior circulation:  Atherosclerotic calcifications present within the cavernous internal carotid arteries bilaterally. The A1 and M1 segments are normal. The anterior communicating artery is patent. ACA and MCA branch vessels are normal. MCA bifurcations are unremarkable. Posterior circulation: The vertebral arteries are codominant. PICA origins are visualized and normal. The vertebrobasilar junction is normal. Posterior cerebral arteries originate from the basilar tip bilaterally. Venous sinuses: Dural sinuses are patent. Straight sinus deep cerebral veins are intact. Cortical veins are within normal limits. Anatomic variants: None Delayed phase: Delayed images demonstrate no pathologic enhancement. White matter disease is exaggerated. Review of the MIP images confirms the above findings IMPRESSION: 1. No emergent large vessel occlusion. 2. Diffuse white matter disease. 3. Tortuosity of the cervical internal carotid arteries bilaterally with narrowing of 50% or less relative to the more distal vessels. 4. Atherosclerotic calcifications at the cavernous internal carotid arteries bilaterally without significant stenosis. 5. Degenerative changes in the cervical spine. Electronically Signed   By: San Morelle M.D.   On: 01/30/2018 15:54   Ct Angio Neck W And/or Wo Contrast  Result Date: 01/30/2018 CLINICAL DATA:  Code stroke. Sudden onset of aphasia today. Previous CVA. Patient is on warfarin. EXAM: CT ANGIOGRAPHY HEAD AND NECK TECHNIQUE: Multidetector CT imaging of the head and neck was performed using the standard protocol during bolus administration of intravenous contrast. Multiplanar CT image reconstructions and MIPs were obtained to evaluate the vascular anatomy. Carotid stenosis measurements (when applicable) are obtained utilizing NASCET criteria, using the distal internal carotid diameter as the denominator. CONTRAST:  25mL ISOVUE-370 IOPAMIDOL (ISOVUE-370) INJECTION 76% COMPARISON:  CT head without contrast  12/18/2017. MRI head 03/15/2017. MRA head 03/16/2017. FINDINGS: CTA NECK FINDINGS Aortic arch: A 3 vessel arch configuration is present. Atherosclerotic calcifications present in the distal arch without aneurysm or stenosis. Right carotid system: The right common carotid artery is within normal limits. Atherosclerotic calcifications are present at the right carotid bifurcation. There is no significant stenosis. There is focal tortuosity of the cervical right ICA with a % stenosis relative to the more distal vessel. No significant stenoses are present. Left carotid system: The left common carotid artery is within normal limits. Left bifurcation is unremarkable. There is moderate tortuosity of the cervical left ICA. A 50% stenosis is present in the mid left ICA relative to the more distal segments. No significant stenosis are present. Vertebral arteries: The vertebral arteries originate from the subclavian arteries bilaterally without significant stenosis. There is some tortuosity. No significant stenosis is present in either vertebral artery in the neck Skeleton: Chronic endplate degenerative changes present at C4-5, C5-6, and C6-7. Osseous foraminal narrowing is worse on the left at these 3 levels. No focal lytic or blastic lesions are present. Other neck: Thyroid is unremarkable. Salivary glands are within normal limits. No significant adenopathy is present. No focal mucosal or submucosal lesions are present. Upper chest: The lung apices are clear without focal nodule, mass, or airspace disease. Thoracic inlet is within normal limits. Review of the MIP images confirms the above findings CTA HEAD FINDINGS Anterior circulation: Atherosclerotic calcifications  present within the cavernous internal carotid arteries bilaterally. The A1 and M1 segments are normal. The anterior communicating artery is patent. ACA and MCA branch vessels are normal. MCA bifurcations are unremarkable. Posterior circulation: The vertebral  arteries are codominant. PICA origins are visualized and normal. The vertebrobasilar junction is normal. Posterior cerebral arteries originate from the basilar tip bilaterally. Venous sinuses: Dural sinuses are patent. Straight sinus deep cerebral veins are intact. Cortical veins are within normal limits. Anatomic variants: None Delayed phase: Delayed images demonstrate no pathologic enhancement. White matter disease is exaggerated. Review of the MIP images confirms the above findings IMPRESSION: 1. No emergent large vessel occlusion. 2. Diffuse white matter disease. 3. Tortuosity of the cervical internal carotid arteries bilaterally with narrowing of 50% or less relative to the more distal vessels. 4. Atherosclerotic calcifications at the cavernous internal carotid arteries bilaterally without significant stenosis. 5. Degenerative changes in the cervical spine. Electronically Signed   By: San Morelle M.D.   On: 01/30/2018 15:54   Mr Brain Wo Contrast  Result Date: 01/30/2018 CLINICAL DATA:  Acute onset dysphagia this morning, confusion. EXAM: MRI HEAD WITHOUT CONTRAST TECHNIQUE: Axial diffusion weighted imaging, sagittal T1, axial T2 and axial T2 FLAIR sequences. Per technologist note, patient could not tolerate further imaging and examination was terminated. COMPARISON:  CT HEAD January 30, 2018 and MRI head March 15, 2017. FINDINGS: Moderately motion degraded examination. INTRACRANIAL CONTENTS: No reduced diffusion to suggest acute ischemia. No advanced parenchymal brain volume loss for age. Confluent supratentorial and patchy pontine white matter FLAIR T2 hyperintensities. Old small bilateral cerebellar infarcts. No midline shift, mass effect or definite masses. No abnormal extra-axial fluid collections. 5 mm pineal cyst. VASCULAR: Normal major intracranial vascular flow voids present at skull base. SKULL AND UPPER CERVICAL SPINE: No abnormal sellar expansion. No suspicious calvarial bone marrow  signal. Craniocervical junction maintained. SINUSES/ORBITS: The mastoid air-cells and included paranasal sinuses are well-aerated.The included ocular globes and orbital contents are non-suspicious. Status post bilateral ocular lens implants. OTHER: Patient is edentulous. IMPRESSION: 1. Limited 4 sequence moderately motion degraded MRI head: No acute intracranial process. 2. Severe chronic small vessel ischemic changes and old small cerebellar infarcts. Electronically Signed   By: Elon Alas M.D.   On: 01/30/2018 21:21   Ct Head Code Stroke Wo Contrast  Result Date: 01/30/2018 CLINICAL DATA:  Code stroke. Sudden onset of aphasia. Previous CVA. Patient on warfarin. EXAM: CT HEAD WITHOUT CONTRAST TECHNIQUE: Contiguous axial images were obtained from the base of the skull through the vertex without intravenous contrast. COMPARISON:  CT head without contrast 12/18/2017 FINDINGS: Brain: Advanced atrophy and white matter changes are present bilaterally. Acute hemorrhage is present. No acute cortical infarct is present. Basal ganglia are stable. Insular ribbon is intact bilaterally. Brainstem and cerebellum are. Vascular: Blood vessels are uniformly hyperdense. No focal asymmetry is evident. Skull: Calvarium is intact. No focal lytic or blastic lesions are present. Sinuses/Orbits: The paranasal sinuses and mastoid air cells are clear. The globes and orbits are within normal limits. ASPECTS [facility] Stroke Program Early CT Score) - Ganglionic level infarction (caudate, lentiform nuclei, internal capsule, insula, M1-M3 cortex): 7/7 - Supraganglionic infarction (M4-M6 cortex): 3/3 Total score (0-10 with 10 being normal): 10/10 IMPRESSION: 1. No acute intracranial abnormality or significant interval change. 2. Stable advanced atrophy and white matter disease. 3. Blood vessels are uniformly hyperdense without focal asymmetry to suggest thrombus. 4. ASPECTS is 10/10 These results were called by telephone at the time of  interpretation on 01/30/2018  at 12:20 pm to Dr. Charlesetta Shanks , who verbally acknowledged these results. Electronically Signed   By: San Morelle M.D.   On: 01/30/2018 12:21        Scheduled Meds: .  stroke: mapping our early stages of recovery book   Does not apply Once  . atorvastatin  20 mg Oral QHS  . digoxin  0.125 mg Oral Daily  . docusate sodium  100 mg Oral Daily  . donepezil  5 mg Oral QHS  . flecainide  50 mg Oral Q12H  . levETIRAcetam  250 mg Oral BID  . levothyroxine  50 mcg Oral Q0600  . mirtazapine  15 mg Oral QHS  . pantoprazole  40 mg Oral Daily  . warfarin  2.5 mg Oral Once per day on Sun Tue Thu  . warfarin  5 mg Oral Once per day on Mon Wed Fri Sat  . Warfarin - Physician Dosing Inpatient   Does not apply q1800   Continuous Infusions: . sodium chloride 75 mL/hr at 01/30/18 2105     LOS: 0 days    Time spent: [place], MD [facility]  If 7PM-7AM, please contact night-coverage [email or web address] Password [facility] 01/31/2018, 11:51 AM

## 2018-01-31 NOTE — Chronic Care Management (AMB) (Signed)
  Chronic Care Management    Clinical Social Work General Follow Up Note  01/31/2018 Name: KAYLISE BLAKELEY MRN: [id number] DOB: [birth year]   Referred by: PCP, Dr.Dettinger for psychosocial assessment  Review of patient status, including review of consultants reports, relevant laboratory and other test results, and collaboration with appropriate care team members and the patient's provider was performed as part of comprehensive patient evaluation and provision of chronic care management services.    Last CCM Appointment: 01/31/2018  Ms. Holladay was given information about Chronic Care Management services today including:  1. CCM service includes personalized support from designated clinical staff supervised by her physician, including individualized plan of care and coordination with other care providers 2. 24/7 contact phone numbers for assistance for urgent and routine care needs. 3. Service will only be billed when office clinical staff spend 20 minutes or more in a month to coordinate care. 4. Only one practitioner may furnish and bill the service in a calendar month. 5. The patient may stop CCM services at any time (effective at the end of the month) by phone call to the office staff. 6. The patient will be responsible for cost sharing (co-pay) of up to 20% of the service fee (after annual deductible is met).  Patient did not agree to services and does not wish to consider at this time.   Follow Up Plan: Client to attend medical appointments with Dr. Warrick Parisian as scheduled   Norva Riffle. MSW, LCSW Licensed Clinical Social Worker [facility] Care Management [phone]

## 2018-02-01 ENCOUNTER — Ambulatory Visit: Payer: Medicare Other | Admitting: Cardiology

## 2018-02-01 DIAGNOSIS — I480 Paroxysmal atrial fibrillation: Secondary | ICD-10-CM | POA: Diagnosis not present

## 2018-02-01 DIAGNOSIS — I5032 Chronic diastolic (congestive) heart failure: Secondary | ICD-10-CM

## 2018-02-01 DIAGNOSIS — R4701 Aphasia: Secondary | ICD-10-CM | POA: Diagnosis not present

## 2018-02-01 DIAGNOSIS — G894 Chronic pain syndrome: Secondary | ICD-10-CM | POA: Diagnosis not present

## 2018-02-01 DIAGNOSIS — F4489 Other dissociative and conversion disorders: Secondary | ICD-10-CM | POA: Diagnosis not present

## 2018-02-01 LAB — BASIC METABOLIC PANEL WITH GFR
BUN: 14 mg/dL (ref 8–23)
Calcium: 8.1 mg/dL — ABNORMAL LOW (ref 8.9–10.3)

## 2018-02-01 LAB — CBC
HCT: 41.8 % (ref 36.0–46.0)
Hemoglobin: 13.6 g/dL (ref 12.0–15.0)
MCH: 30.4 pg (ref 26.0–34.0)
MCHC: 32.5 g/dL (ref 30.0–36.0)
MCV: 93.5 fL (ref 80.0–100.0)
Platelets: 291 10*3/uL (ref 150–400)
RBC: 4.47 MIL/uL (ref 3.87–5.11)
RDW: 14 % (ref 11.5–15.5)
WBC: 8.8 10*3/uL (ref 4.0–10.5)
nRBC: 0 % (ref 0.0–0.2)

## 2018-02-01 LAB — BASIC METABOLIC PANEL
Anion gap: 11 (ref 5–15)
CO2: 23 mmol/L (ref 22–32)
Chloride: 94 mmol/L — ABNORMAL LOW (ref 98–111)
Creatinine, Ser: 0.59 mg/dL (ref 0.44–1.00)
GFR calc Af Amer: >60 mL/min (ref >60)
GFR calc non Af Amer: >60 mL/min (ref >60)
Glucose, Bld: 96 mg/dL (ref 70–99)
Potassium: 3.1 mmol/L — ABNORMAL LOW (ref 3.5–5.1)
Sodium: 128 mmol/L — ABNORMAL LOW (ref 135–145)

## 2018-02-01 LAB — MAGNESIUM: Magnesium: 1.7 mg/dL (ref 1.7–2.4)

## 2018-02-01 MED ORDER — LEVETIRACETAM 500 MG PO TABS
500.0000 mg | ORAL_TABLET | Freq: Two times a day (BID) | ORAL | Status: DC
  Administered 2018-02-01 – 2018-02-03 (×4): 500 mg via ORAL
  Filled 2018-02-01 (×4): qty 1

## 2018-02-01 MED ORDER — POTASSIUM CHLORIDE CRYS ER 20 MEQ PO TBCR
40.0000 meq | EXTENDED_RELEASE_TABLET | Freq: Two times a day (BID) | ORAL | Status: AC
  Administered 2018-02-01 (×2): 40 meq via ORAL
  Filled 2018-02-01 (×2): qty 2

## 2018-02-01 MED ORDER — LABETALOL HCL 5 MG/ML IV SOLN
5.0000 mg | INTRAVENOUS | Status: DC | PRN
  Administered 2018-02-01: 5 mg via INTRAVENOUS
  Filled 2018-02-01 (×2): qty 4

## 2018-02-01 MED ORDER — METOPROLOL SUCCINATE ER 100 MG PO TB24
100.0000 mg | ORAL_TABLET | Freq: Every day | ORAL | Status: DC
  Administered 2018-02-01 – 2018-02-03 (×3): 100 mg via ORAL
  Filled 2018-02-01 (×3): qty 1

## 2018-02-01 MED ORDER — APIXABAN 2.5 MG PO TABS
2.5000 mg | ORAL_TABLET | Freq: Two times a day (BID) | ORAL | Status: DC
  Administered 2018-02-01 – 2018-02-03 (×4): 2.5 mg via ORAL
  Filled 2018-02-01 (×4): qty 1

## 2018-02-01 NOTE — Progress Notes (Signed)
PROGRESS NOTE    [name]  SWF:[id number] DOB: 22-Nov-1932 DOA: 01/30/2018 PCP: Dettinger, Fransisca Kaufmann, MD    Brief Narrative:  85 y.o.femalewith medical history significant ofparoxysmal atrial fibrillation/flutter on warfarin anticoagulation, history of TIAs, dementia, chronic diastolic grade 1 heart failure (03/16/2017 echo), nonobstructive coronary artery disease by catheterization on 06/22/2016, hyperlipidemia, hypertension, seizure disorder, Hypothyroidismwho presents from home with confusion and possible aphasia concerning for stroke.  Assessment & Plan:   Active Problems:   Warfarin anticoagulation   Hypothyroidism   S/P knee replacement   Paroxysmal atrial fibrillation (HCC)   Chronic diastolic CHF (congestive heart failure) (HCC)   Chronic pain syndrome   HTN (hypertension)   History of CVA (cerebrovascular accident)   Osteoporosis   History of TIA (transient ischemic attack)   Mild dementia ([name])   Aphasia   CVA (cerebral vascular accident) ([place])  #) Aphasia/acute encephalopathy: This seems to be improving somewhat though it is unclear if this is a TIA from a subtherapeutic INR versus possible seizure -Neurology consulted, Discussed with Neurology -EEG unremarkable -MRI brain shows no evidence of stroke -PT consult pending -Echo 01/31/2018 unremarkable with EF of 60-65%  #) Seizure disorder: -Discussed with Neurology. Recommendation to increase keppra to 500mg  bid -Recommendation for close outpatient follow up with Neurology on discharge  #) Paroxysmal atrial fibrillation on warfarin, RVR - Discontinue warfarin, start apixaban 5 mg twice daily - Continue home digoxin 0.125 mcg daily -Continue flecainide 50 mg twice daily -Will resume beta blocker today. -HR up to the 140's on tele this AM. PRN IV labetalol ordered. Now improved  #) Hypertension/hyperlipidemia/nonobstructive coronary artery disease: -Continue atorvastatin 20 mg daily -Irbesartan 150 mg  daily not ordered, will hold for now given soft BP earlier this afternoon -Will restart beta blocker today  #) Dementia: -Continue donepezil 5 mg daily  #) Hypothyroidism: -Continue levothyroxine 50 mcg daily -TSH normal at present  #) Pain/psych: -Continue mirtazapine 15 mg nightly  DVT prophylaxis: Eliquis Code Status: Full Family Communication: Pt in room, family not at bedside Disposition Plan: SNF likely in 24hrs  Consultants:   Neurology  Procedures:     Antimicrobials: Anti-infectives (From admission, onward)   None       Subjective: Without complaints  Objective: Vitals:   02/01/18 0805 02/01/18 1021 02/01/18 1203 02/01/18 1500  BP: (!) 142/93 (!) 166/95 110/69 127/71  Pulse: (!) 103 97 77 79  Resp: 16 16 18 18   Temp: 98.2 F (36.8 C) 98.4 F (36.9 C) 97.6 F (36.4 C) 98 F (36.7 C)  TempSrc: Oral Oral Oral Oral  SpO2: 95% 95% 95% 97%  Weight:      Height:        Intake/Output Summary (Last 24 hours) at 02/01/2018 1905 Last data filed at 02/01/2018 1400 Gross per 24 hour  Intake 240 ml  Output -  Net 240 ml   Filed Weights   01/30/18 1125  Weight: 59 kg    Examination:  General exam: Appears calm and comfortable  Respiratory system: Clear to auscultation. Respiratory effort normal. Cardiovascular system: S1 & S2 heard, RRR Gastrointestinal system: Abdomen is nondistended, soft and nontender. No organomegaly or masses felt. Normal bowel sounds heard. Central nervous system: Alert and oriented. No focal neurological deficits. Extremities: Symmetric 5 x 5 power. Skin: No rashes, lesions Psychiatry: Judgement and insight appear normal. Mood & affect appropriate.   Data Reviewed: I have personally reviewed following labs and imaging studies  CBC: Recent Labs  Lab 01/30/18 1212 02/01/18  0629  WBC 7.8 8.8  NEUTROABS 5.5  --   HGB 14.0 13.6  HCT 44.0 41.8  MCV 95.2 93.5  PLT 301 993   Basic Metabolic Panel: Recent Labs    Lab 01/30/18 1212 02/01/18 0629  NA 134* 128*  K 4.2 3.1*  CL 97* 94*  CO2 25 23  GLUCOSE 115* 96  BUN 10 14  CREATININE 0.59 0.59  CALCIUM 8.5* 8.1*  MG  --  1.7   GFR: Estimated Creatinine Clearance: 44.1 mL/min (by C-G formula based on SCr of 0.59 mg/dL). Liver Function Tests: Recent Labs  Lab 01/30/18 1212  AST 28  ALT 18  ALKPHOS 54  BILITOT 1.0  PROT 7.2  ALBUMIN 3.8   No results for input(s): LIPASE, AMYLASE in the last 168 hours. Recent Labs  Lab 01/30/18 1309  AMMONIA 13   Coagulation Profile: Recent Labs  Lab 01/30/18 1212 01/31/18 0557  INR 1.60 1.44   Cardiac Enzymes: Recent Labs  Lab 01/30/18 1212  TROPONINI <0.03   BNP (last 3 results) No results for input(s): PROBNP in the last 8760 hours. HbA1C: Recent Labs    01/31/18 0557  HGBA1C 5.9*   CBG: Recent Labs  Lab 01/31/18 1336  GLUCAP 106*   Lipid Profile: Recent Labs    01/31/18 0557  CHOL 144  HDL 70  LDLCALC 64  TRIG 52  CHOLHDL 2.1   Thyroid Function Tests: Recent Labs    01/30/18 1309 01/30/18 1906  TSH  --  3.101  T3FREE 3.3  --    Anemia Panel: No results for input(s): VITAMINB12, FOLATE, FERRITIN, TIBC, IRON, RETICCTPCT in the last 72 hours. Sepsis Labs: No results for input(s): PROCALCITON, LATICACIDVEN in the last 168 hours.  No results found for this or any previous visit (from the past 240 hour(s)).   Radiology Studies: Mr Brain Wo Contrast  Result Date: 01/30/2018 CLINICAL DATA:  Acute onset dysphagia this morning, confusion. EXAM: MRI HEAD WITHOUT CONTRAST TECHNIQUE: Axial diffusion weighted imaging, sagittal T1, axial T2 and axial T2 FLAIR sequences. Per technologist note, patient could not tolerate further imaging and examination was terminated. COMPARISON:  CT HEAD January 30, 2018 and MRI head March 15, 2017. FINDINGS: Moderately motion degraded examination. INTRACRANIAL CONTENTS: No reduced diffusion to suggest acute ischemia. No advanced  parenchymal brain volume loss for age. Confluent supratentorial and patchy pontine white matter FLAIR T2 hyperintensities. Old small bilateral cerebellar infarcts. No midline shift, mass effect or definite masses. No abnormal extra-axial fluid collections. 5 mm pineal cyst. VASCULAR: Normal major intracranial vascular flow voids present at skull base. SKULL AND UPPER CERVICAL SPINE: No abnormal sellar expansion. No suspicious calvarial bone marrow signal. Craniocervical junction maintained. SINUSES/ORBITS: The mastoid air-cells and included paranasal sinuses are well-aerated.The included ocular globes and orbital contents are non-suspicious. Status post bilateral ocular lens implants. OTHER: Patient is edentulous. IMPRESSION: 1. Limited 4 sequence moderately motion degraded MRI head: No acute intracranial process. 2. Severe chronic small vessel ischemic changes and old small cerebellar infarcts. Electronically Signed   By: Elon Alas M.D.   On: 01/30/2018 21:21    Scheduled Meds: .  stroke: mapping our early stages of recovery book   Does not apply Once  . apixaban  2.5 mg Oral BID  . atorvastatin  20 mg Oral QHS  . digoxin  0.125 mg Oral Daily  . docusate sodium  100 mg Oral Daily  . donepezil  5 mg Oral QHS  . flecainide  50 mg Oral  Q12H  . levETIRAcetam  500 mg Oral BID  . levothyroxine  50 mcg Oral Q0600  . metoprolol succinate  100 mg Oral Daily  . mirtazapine  15 mg Oral QHS  . pantoprazole  40 mg Oral Daily  . potassium chloride  40 mEq Oral BID   Continuous Infusions:   LOS: 0 days   Marylu Lund, MD [facility] Pager On Amion  If 7PM-7AM, please contact night-coverage 02/01/2018, 7:05 PM

## 2018-02-01 NOTE — Progress Notes (Signed)
   02/01/18 0805  MEWS Score  Resp 16  Pulse Rate (!) 103  BP (!) 142/93  Temp 98.2 F (36.8 C)  SpO2 95 %  O2 Device Room Air  MEWS Score  MEWS RR 0  MEWS Pulse 1  MEWS Systolic 0  MEWS LOC 0  MEWS Temp 0  MEWS Score 1  MEWS Score Color Green

## 2018-02-01 NOTE — Plan of Care (Signed)
  Problem: Education: Goal: Knowledge of General Education information will improve Description Including pain rating scale, medication(s)/side effects and non-pharmacologic comfort measures Outcome: Progressing   

## 2018-02-01 NOTE — Progress Notes (Signed)
Clinical Social Worker spoke with patients son via phone. Son [name]) stated that he would like patient to go to rehab. Ray stated he would like patient to go to [facility] for short term rehab. CSW stated that [facility] at this time is unable to take patient. CSW gave Ray a list of facilities in the [place] area that has made a bed offer. CSW explained to Ray the process of SNF and stated that because of patient managed medicare patient will be unable to discharge without prior authorization . CSW encouraged patients son to look at facilities and make a decision as soon as possible.   Rhea Pink, MSW,  [place]

## 2018-02-01 NOTE — Progress Notes (Signed)
Subjective: Markedly improved compared to yesterday.  I spoke to her son who states that she improved over the course of the morning, was speaking normally by midday.  Exam: Vitals:   02/01/18 0805 02/01/18 1021  BP: (!) 142/93 (!) 166/95  Pulse: (!) 103 97  Resp: 16 16  Temp: 98.2 F (36.8 C) 98.4 F (36.9 C)  SpO2: 95% 95%   Gen: In bed, NAD Resp: non-labored breathing, no acute distress Abd: soft, nt  Neuro: MS: Awake, alert, interactive and appropriate.  She is able to name objects and repeat.  She is not oriented to month or year CN: Pupils equal round reactive, extraocular movements intact Motor: Moves all extremities well Sensory: Intact light touch  EEG with no epileptiform activity  Impression: 85 year old female with 2 episodes of aphasia.  Given that her INR was subtherapeutic, I do think that TIA remains in the differential, however the fact that the symptoms are similar each time does raise the possibility of seizures.  She is on Keppra, though her son is not familiar with a history of seizures.  I think that complex partial seizures are in the differential, and her keppra level was slightly low. I would increase this to 500mg  BID  Recommendations: 1) increase keppra to 500mg  BID 2) Continue apixaban for anticoagulation.  3) LDL 64, ok.  4) Neurology will sign off, but is available should any question arise. Please call with further questions or concerns.   Roland Rack, MD [facility] [phone]  If 7pm- 7am, please page neurology on call as listed in [place].

## 2018-02-01 NOTE — Plan of Care (Signed)
  Problem: Education: Goal: Knowledge of disease or condition will improve Outcome: Progressing Goal: Knowledge of secondary prevention will improve Outcome: Progressing   

## 2018-02-01 NOTE — Plan of Care (Signed)
Pt  calm and cooperative this shift.

## 2018-02-01 NOTE — NC FL2 (Signed)
[place] MEDICAID FL2 LEVEL OF CARE SCREENING TOOL     IDENTIFICATION  Patient Name: [name] Birthdate: 06-06-1932 Sex: female Admission Date (Current Location): 01/30/2018  [facility] and [place] Number:  Herbalist and Address:  [facility],  [address] [place], [place]      Provider Number: [id number]  Attending Physician Name and Address:  Donne Hazel, MD  Relative Name and Phone Number:  Verla Bryngelson, [phone]    Current Level of Care: Hospital Recommended Level of Care: [place] Prior Approval Number:    Date Approved/Denied:   PASRR Number: [id number]  Discharge Plan: SNF    Current Diagnoses: Patient Active Problem List   Diagnosis Date Noted  . Aphasia 01/30/2018  . CVA (cerebral vascular accident) ([place]) 01/30/2018  . History of Clostridioides difficile colitis 12/23/2017  . Mild dementia ([place]) 12/23/2017  . History of TIA (transient ischemic attack) 03/15/2017  . Osteoporosis 02/24/2017  . Prolonged QT interval 01/28/2017  . Unstable angina ([place]) 06/16/2016  . Atrial flutter ([place]) 06/16/2016  . Overactive bladder 05/02/2015  . History of CVA (cerebrovascular accident) 04/21/2015  . History of dysarthria 02/20/2015  . HTN (hypertension) 01/11/2015  . Hyponatremia 01/11/2015  . Chronic pain syndrome 11/25/2014  . Insomnia 11/25/2014  . Chronic diastolic CHF (congestive heart failure) ([place]) 11/12/2014  . Paroxysmal atrial fibrillation ([place]) 10/14/2014  . Warfarin anticoagulation   . Hypothyroidism   . Carotid bruit   . S/P knee replacement   . DIVERTICULOSIS-COLON 01/19/2010    Orientation RESPIRATION BLADDER Height & Weight     Self  Normal Incontinent Weight: 130 lb 1.1 oz (59 kg) Height:  5\' 3"  (160 cm)  BEHAVIORAL SYMPTOMS/MOOD NEUROLOGICAL BOWEL NUTRITION STATUS      Continent Diet(regular)  AMBULATORY STATUS COMMUNICATION OF NEEDS Skin   Limited Assist Verbally                      Personal Care Assistance Level of Assistance  Bathing, Feeding, Dressing Bathing Assistance: Limited assistance Feeding assistance: Independent Dressing Assistance: Limited assistance     Functional Limitations Info  Sight, Hearing, Speech Sight Info: Adequate Hearing Info: Adequate Speech Info: Adequate    SPECIAL CARE FACTORS FREQUENCY  PT (By licensed PT), OT (By licensed OT)     PT Frequency: 5x wk OT Frequency: 5x wk            Contractures Contractures Info: Not present    Additional Factors Info  Code Status, Allergies Code Status Info: Full Code Allergies Info: Penicillins           Current Medications (02/01/2018):  This is the current hospital active medication list Current Facility-Administered Medications  Medication Dose Route Frequency Provider Last Rate Last Dose  .  stroke: mapping our early stages of recovery book   Does not apply Once Purohit, Shrey C, MD      . acetaminophen (TYLENOL) tablet 650 mg  650 mg Oral Q4H PRN Purohit, Shrey C, MD       Or  . acetaminophen (TYLENOL) solution 650 mg  650 mg Per Tube Q4H PRN Purohit, Shrey C, MD       Or  . acetaminophen (TYLENOL) suppository 650 mg  650 mg Rectal Q4H PRN Purohit, Shrey C, MD      . apixaban (ELIQUIS) tablet 2.5 mg  2.5 mg Oral BID Donne Hazel, MD      . atorvastatin (LIPITOR) tablet 20 mg  20 mg Oral QHS Purohit, Shrey C, MD   20 mg at 01/31/18 2350  . digoxin (LANOXIN) tablet 0.125 mg  0.125 mg Oral Daily Purohit, Shrey C, MD   0.125 mg at 02/01/18 1026  . docusate sodium (COLACE) capsule 100 mg  100 mg Oral Daily Purohit, Shrey C, MD   100 mg at 02/01/18 1026  . donepezil (ARICEPT) tablet 5 mg  5 mg Oral QHS Purohit, Shrey C, MD   5 mg at 01/31/18 2352  . flecainide (TAMBOCOR) tablet 50 mg  50 mg Oral Q12H Purohit, Shrey C, MD   50 mg at 02/01/18 1026  . labetalol (NORMODYNE,TRANDATE) injection 5 mg  5 mg Intravenous Q2H PRN Donne Hazel, MD      . levETIRAcetam (KEPPRA)  tablet 250 mg  250 mg Oral BID Purohit, Shrey C, MD   250 mg at 02/01/18 1026  . levothyroxine (SYNTHROID, LEVOTHROID) tablet 50 mcg  50 mcg Oral Q0600 Cristy Folks, MD   50 mcg at 02/01/18 [phone]  . magnesium hydroxide (MILK OF MAGNESIA) suspension 15 mL  15 mL Oral Daily PRN Purohit, Shrey C, MD      . metoprolol succinate (TOPROL-XL) 24 hr tablet 100 mg  100 mg Oral Daily Donne Hazel, MD      . mirtazapine (REMERON) tablet 15 mg  15 mg Oral QHS Purohit, Shrey C, MD   15 mg at 01/31/18 2350  . pantoprazole (PROTONIX) EC tablet 40 mg  40 mg Oral Daily Purohit, Shrey C, MD   40 mg at 02/01/18 1026  . polyethylene glycol (MIRALAX / GLYCOLAX) packet 17 g  17 g Oral Daily PRN Purohit, Shrey C, MD      . potassium chloride SA (K-DUR,KLOR-CON) CR tablet 40 mEq  40 mEq Oral BID Donne Hazel, MD   40 mEq at 02/01/18 1026  . prochlorperazine (COMPAZINE) injection 5 mg  5 mg Intravenous Q6H PRN Gardiner Barefoot, NP   5 mg at 01/31/18 1137  . traMADol (ULTRAM) tablet 50 mg  50 mg Oral Q6H PRN Purohit, Konrad Dolores, MD         Discharge Medications: Please see discharge summary for a list of discharge medications.  Relevant Imaging Results:  Relevant Lab Results:   Additional Information SS# [id number]  Wende Neighbors, LCSW

## 2018-02-01 NOTE — Evaluation (Signed)
Occupational Therapy Evaluation Patient Details Name: [name] MRN: [id number] DOB: 02/24/1932 Today's Date: 02/01/2018    History of Present Illness 85 y.o. female admitted from home for confusion and possible aphaisa.  CT and MRI were negative for stroke. Neurology following and EEG pending.  Pt with significant PMH of PAF (on warfarin), TIAs, dementia, diastolic heart failure, CAD, HTN, seizure disorder, anemia, L TSA, bil TKA, and R shoulder RTC repair.     Clinical Impression   Pt was admitted for the above. She was able to tell me that she lives with her sister, and that she is independent with basic adls. She had contradictory information about using an AD or not, based on PT's eval.  Pt needs overall min A for adls/mobility at this time. Will follow in acute setting with min guard level goals.    Follow Up Recommendations  SNF    Equipment Recommendations  3 in 1 bedside commode    Recommendations for Other Services       Precautions / Restrictions Precautions Precautions: Fall Precaution Comments: unsteady Restrictions Weight Bearing Restrictions: No      Mobility Bed Mobility         Supine to sit: Min assist     General bed mobility comments: light assistance for trunk  Transfers   Equipment used: Rolling walker (2 wheeled)   Sit to Stand: Min guard         General transfer comment: for safety    Balance     Sitting balance-Leahy Scale: Fair       Standing balance-Leahy Scale: Poor                             ADL either performed or assessed with clinical judgement   ADL Overall ADL's : Needs assistance/impaired Eating/Feeding: Set up   Grooming: Wash/dry hands;Minimal assistance;Standing Grooming Details (indicate cue type and reason): to operate faucets; get soap/paper towels Upper Body Bathing: Set up;Supervision/ safety   Lower Body Bathing: Minimal assistance;Sit to/from stand   Upper Body Dressing : Minimal  assistance   Lower Body Dressing: Minimal assistance;Sit to/from stand   Toilet Transfer: Minimal assistance;Ambulation;Comfort height toilet;Grab bars;RW Armed forces technical officer Details (indicate cue type and reason): needed assistance to power up from comfort height commode Toileting- Clothing Manipulation and Hygiene: Supervision/safety;Sitting/lateral lean         General ADL Comments: Ambulated to bathroom with min A using RW; unsure if she uses this at baseline. Per chart, she has one.  pt told OT she uses one and told PT she does not use it     Vision         Perception     Praxis      Pertinent Vitals/Pain Pain Assessment: No/denies pain     Hand Dominance Right   Extremity/Trunk Assessment Upper Extremity Assessment Upper Extremity Assessment: Generalized weakness(LUE 3-/5; RUE 3 to 3+/5)           Communication Communication Communication: HOH   Cognition Arousal/Alertness: Awake/alert Behavior During Therapy: WFL for tasks assessed/performed Overall Cognitive Status: No family/caregiver present to determine baseline cognitive functioning                   Orientation Level: Disoriented to;Place;Time;Situation Current Attention Level: Sustained Memory: Decreased short-term memory;Decreased recall of precautions Following Commands: Follows one step commands with increased time(and multimodal cues at times) Safety/Judgement: Decreased awareness of safety;Decreased awareness of deficits  General Comments  HR up to 150 after walking to bathroom.  No dizziness during session and functionally, no dizziness with looking up or down. Tested horizontal canals prior to getting OOB without nystagmus    Exercises     Shoulder Instructions      Home Living Family/patient expects to be discharged to:: Unsure Living Arrangements: Other relatives(sister) Available Help at Discharge: Family;Available 24 hours/day                         Home  Equipment: Walker - 2 wheels;Shower seat;Grab bars - tub/shower;Cane - single point          Prior Functioning/Environment      ADL's / Homemaking Assistance Needed: sister cooks, cleans, and manages her medication.  Pt sponge bathes.            OT Problem List: Decreased strength;Decreased activity tolerance;Impaired balance (sitting and/or standing);Decreased cognition;Decreased safety awareness;Cardiopulmonary status limiting activity      OT Treatment/Interventions: Self-care/ADL training;DME and/or AE instruction;Therapeutic activities;Cognitive remediation/compensation;Patient/family education;Balance training    OT Goals(Current goals can be found in the care plan section) Acute Rehab OT Goals Patient Stated Goal: none stated OT Goal Formulation: With patient Time For Goal Achievement: 02/15/18 Potential to Achieve Goals: Good ADL Goals Pt Will Transfer to Toilet: with min guard assist;ambulating;bedside commode Additional ADL Goal #1: pt will complete adl with min guard and set up  OT Frequency: Min 2X/week   Barriers to D/C:            Co-evaluation              AM-PAC OT "6 Clicks" Daily Activity     Outcome Measure Help from another person eating meals?: A Little Help from another person taking care of personal grooming?: A Little Help from another person toileting, which includes using toliet, bedpan, or urinal?: A Little Help from another person bathing (including washing, rinsing, drying)?: A Little Help from another person to put on and taking off regular upper body clothing?: A Little Help from another person to put on and taking off regular lower body clothing?: A Little 6 Click Score: 18   End of Session    Activity Tolerance: Patient tolerated treatment well Patient left: in chair;with call bell/phone within reach;with bed alarm set;with nursing/sitter in room  OT Visit Diagnosis: Unsteadiness on feet (R26.81)                Time:  9191-6606 OT Time Calculation (min): 20 min Charges:  OT General Charges $OT Visit: 1 Visit OT Evaluation $OT Eval Low Complexity: [place], OTR/L Acute Rehabilitation Services [phone] [facility] pager [phone] office 02/01/2018  [place] 02/01/2018, 12:24 PM

## 2018-02-01 NOTE — Progress Notes (Signed)
   02/01/18 0701  MEWS Score  ECG Heart Rate (!) 144  MEWS Score  MEWS RR 0  MEWS Pulse 3  MEWS Systolic 0  MEWS LOC 0  MEWS Temp 0  MEWS Score 3  MEWS Score Color Yellow   Pt assessed, no acute change noted. MEWS guidelines initiated for VS check every 2 hrs.  CN notified, MD notified. Will continue to monitor. Stacey Drain

## 2018-02-02 DIAGNOSIS — I5032 Chronic diastolic (congestive) heart failure: Secondary | ICD-10-CM | POA: Diagnosis not present

## 2018-02-02 DIAGNOSIS — I480 Paroxysmal atrial fibrillation: Secondary | ICD-10-CM

## 2018-02-02 DIAGNOSIS — G894 Chronic pain syndrome: Secondary | ICD-10-CM

## 2018-02-02 DIAGNOSIS — R4701 Aphasia: Secondary | ICD-10-CM | POA: Diagnosis not present

## 2018-02-02 LAB — BASIC METABOLIC PANEL
Anion gap: 10 (ref 5–15)
BUN: 14 mg/dL (ref 8–23)
CO2: 23 mmol/L (ref 22–32)
CREATININE: 0.54 mg/dL (ref 0.44–1.00)
Calcium: 8.4 mg/dL — ABNORMAL LOW (ref 8.9–10.3)
Chloride: 100 mmol/L (ref 98–111)
GFR calc Af Amer: >60 mL/min (ref >60)
GFR calc non Af Amer: >60 mL/min (ref >60)
Glucose, Bld: 95 mg/dL (ref 70–99)
Potassium: 4.3 mmol/L (ref 3.5–5.1)
SODIUM: 133 mmol/L — AB (ref 135–145)

## 2018-02-02 MED ORDER — APIXABAN 2.5 MG PO TABS: 2.5000 mg | ORAL_TABLET | Freq: Two times a day (BID) | ORAL | 0 refills | Status: DC

## 2018-02-02 MED ORDER — HYDROCORTISONE 2.5 % RE CREA
TOPICAL_CREAM | Freq: Two times a day (BID) | RECTAL | Status: DC | PRN
  Administered 2018-02-02: 19:00:00 via RECTAL
  Filled 2018-02-02 (×2): qty 28.35

## 2018-02-02 MED ORDER — LEVETIRACETAM 500 MG PO TABS: 500.0000 mg | ORAL_TABLET | Freq: Two times a day (BID) | ORAL | 0 refills | Status: DC

## 2018-02-02 NOTE — Progress Notes (Signed)
Clinical Social Worker following patient for support and discharge needs. CSW spoke with pateints son lastnight and discuss SNF options. Son stated he has made a decision and would like [facility]. CSW spoke with [facility] admission coordinator and she stated she will start authorization through [facility]. CSW awaiting approval through patients insurance    Rhea Pink, MSW,  [place]

## 2018-02-02 NOTE — Plan of Care (Signed)
Pt compliant with safety measures this shift.

## 2018-02-02 NOTE — Progress Notes (Signed)
Physical Therapy Treatment Patient Details Name: [name] MRN: [id number] DOB: 09/30/1932 Today's Date: 02/02/2018    History of Present Illness 85 y.o. female admitted from home for confusion and possible aphaisa.  CT and MRI were negative for stroke. Neurology following and EEG pending.  Pt with significant PMH of PAF (on warfarin), TIAs, dementia, diastolic heart failure, CAD, HTN, seizure disorder, anemia, L TSA, bil TKA, and R shoulder RTC repair.      PT Comments    Pt participated well. Session was limited due to pt began bleeding after using BSC. RN in to assess situation-bleeding possibly from hemorrhoids. Will continue to follow and progress activity as tolerated.    Follow Up Recommendations  SNF     Equipment Recommendations  None recommended by PT    Recommendations for Other Services       Precautions / Restrictions Precautions Precautions: Fall Restrictions Weight Bearing Restrictions: No    Mobility  Bed Mobility Overal bed mobility: Needs Assistance Bed Mobility: Supine to Sit     Supine to sit: Min guard;HOB elevated     General bed mobility comments: close guard for safety.   Transfers Overall transfer level: Needs assistance Equipment used: Rolling walker (2 wheeled);None Transfers: Sit to/from Stand Sit to Stand: Min assist         General transfer comment: Sit to stand x 2. VCs safety, technique, hand placement. Stand pivoit, bed to bsc without a device-pt held on to armrests of bsc.   Ambulation/Gait Ambulation/Gait assistance: Min assist Gait Distance (Feet): 5 Feet Assistive device: Rolling walker (2 wheeled) Gait Pattern/deviations: Step-through pattern;Decreased stride length     General Gait Details: Assist to stabilize and maneuver with RW. Only took a few steps in the room due to pt bleeding after using bsc. Called RN in to assess-RN thinks it's probably hemorrhoids.    Stairs             Wheelchair Mobility     Modified Rankin (Stroke Patients Only)       Balance           Standing balance support: Bilateral upper extremity supported Standing balance-Leahy Scale: Poor                              Cognition Arousal/Alertness: Awake/alert Behavior During Therapy: WFL for tasks assessed/performed Overall Cognitive Status: History of cognitive impairments - at baseline Area of Impairment: Orientation                 Orientation Level: Disoriented to;Place;Time;Situation   Memory: Decreased recall of precautions;Decreased short-term memory Following Commands: Follows one step commands with increased time Safety/Judgement: Decreased awareness of deficits;Decreased awareness of safety   Problem Solving: Slow processing;Decreased initiation;Requires verbal cues;Requires tactile cues;Difficulty sequencing General Comments: Pt is HOH with baseline dementia and she is in an unfamiliar environment.        Exercises      General Comments        Pertinent Vitals/Pain Pain Assessment: No/denies pain    Home Living                      Prior Function            PT Goals (current goals can now be found in the care plan section) Progress towards PT goals: Progressing toward goals    Frequency  PT Plan Current plan remains appropriate    Co-evaluation              AM-PAC PT "6 Clicks" Mobility   Outcome Measure  Help needed turning from your back to your side while in a flat bed without using bedrails?: A Little Help needed moving from lying on your back to sitting on the side of a flat bed without using bedrails?: A Little Help needed moving to and from a bed to a chair (including a wheelchair)?: A Little Help needed standing up from a chair using your arms (e.g., wheelchair or bedside chair)?: A Little Help needed to walk in hospital room?: A Little Help needed climbing 3-5 steps with a railing? : A Lot 6 Click Score: 17     End of Session   Activity Tolerance: Patient tolerated treatment well Patient left: in chair;with call bell/phone within reach;with chair alarm set   PT Visit Diagnosis: Unsteadiness on feet (R26.81);Muscle weakness (generalized) (M62.81);Difficulty in walking, not elsewhere classified (R26.2)     Time: 6151-8343 PT Time Calculation (min) (ACUTE ONLY): 18 min  Charges:  $Gait Training: 8-22 mins                         Weston Anna, PT Acute Rehabilitation Services Pager: [phone] Office: [phone]

## 2018-02-02 NOTE — Progress Notes (Signed)
Pt had large BM with some bright red blood present. Reported to Dr. Wyline Copas, anusol cream ordered. Stacey Drain

## 2018-02-02 NOTE — Discharge Summary (Addendum)
Physician Discharge Summary  [name]:[id number] DOB: 11/14/1932 DOA: 01/30/2018  PCP: Dettinger, Fransisca Kaufmann, MD  Admit date: 01/30/2018 Discharge date: 02/03/2018  Admitted From: Home Disposition:  Home  Recommendations for Outpatient Follow-up:  1. Follow up with PCP in 1-2 weeks 2. Follow up with Neurology as scheduled  Discharge Condition:Stable CODE STATUS:Full Diet recommendation: Heart healthy   Brief/Interim Summary: 85 y.o.femalewith medical history significant ofparoxysmal atrial fibrillation/flutter on warfarin anticoagulation, history of TIAs, dementia, chronic diastolic grade 1 heart failure (03/16/2017 echo), nonobstructive coronary artery disease by catheterization on 06/22/2016, hyperlipidemia, hypertension, seizure disorder, Hypothyroidismwho presents from home with confusion and possible aphasia concerning for stroke.  Discharge Diagnoses:  Active Problems:   Warfarin anticoagulation   Hypothyroidism   S/P knee replacement   Paroxysmal atrial fibrillation (HCC)   Chronic diastolic CHF (congestive heart failure) (HCC)   Chronic pain syndrome   HTN (hypertension)   History of CVA (cerebrovascular accident)   Osteoporosis   History of TIA (transient ischemic attack)   Mild dementia ([place])   Aphasia   CVA (cerebral vascular accident) ([place])  #) Aphasia/acute encephalopathy secondary to TIA vs seizure: -Improved -Neurology consulted, Discussed with Neurology -EEG unremarkable -MRI brainshows no evidence of stroke -PT consult pending -Echo1/28/2020 unremarkable with EF of 60-65%  #) Seizure disorder: -Discussed with Neurology. Recommendation to increase keppra to 500mg  bid -Recommendation for close outpatient follow up with Neurology on discharge -Patient aware not to drive for 6 months or when cleared by Neurology  #) Paroxysmal atrial fibrillation on warfarin, RVR -Discontinue warfarin, start apixaban 5 mg twice daily - Continue home  digoxin 0.125 mcg daily -Continued flecainide 50 mg twice daily -Resumed beta blocker today. -HR briefly up to the 140's improved wtih PRN IV labetalol ordered and resumption of metoprolol  #) Hypertension/hyperlipidemia/nonobstructive coronary artery disease: -Continue atorvastatin 20 mg daily -Irbesartan 150 mg daily not ordered, will hold for now given soft BP this admit -continued beta blocker  #) Dementia: -Continue donepezil 5 mg daily  #) Hypothyroidism: -Continue levothyroxine 50 mcg daily -TSHnormal at present  #) Pain/psych: -Continue mirtazapine 15 mg nightly   Discharge Instructions  Discharge Instructions    Ambulatory referral to Neurology   Complete by:  As directed    An appointment is requested in approximately: 4 weeks for seizures     Allergies as of 02/03/2018      Reactions   Penicillins Rash, Other (See Comments)   Has patient had a PCN reaction causing immediate rash, facial/tongue/throat swelling, SOB or lightheadedness with hypotension: No Has patient had a PCN reaction causing severe rash involving mucus membranes or skin necrosis: No Has patient had a PCN reaction that required hospitalization No Has patient had a PCN reaction occurring within the last 10 years: No If all of the above answers are "NO", then may proceed with Cephalosporin use.      Medication List    STOP taking these medications   cephALEXin 500 MG capsule Commonly known as:  KEFLEX   irbesartan 150 MG tablet Commonly known as:  AVAPRO   potassium chloride 10 MEQ tablet Commonly known as:  K-DUR,KLOR-CON   warfarin 5 MG tablet Commonly known as:  COUMADIN     TAKE these medications   apixaban 2.5 MG Tabs tablet Commonly known as:  ELIQUIS Take 1 tablet (2.5 mg total) by mouth 2 (two) times daily for 30 days.   ARTIFICIAL TEAR OP Apply 2 drops to eye daily as needed (Dryness). To the left  eye   atorvastatin 20 MG tablet Commonly known as:  LIPITOR Take 1  tablet (20 mg total) by mouth at bedtime.   digoxin 0.125 MG tablet Commonly known as:  LANOXIN Take 1 tablet (0.125 mg total) by mouth daily.   docusate sodium 50 MG capsule Commonly known as:  COLACE Take 100 mg by mouth daily.   donepezil 5 MG tablet Commonly known as:  ARICEPT Take 1 tablet (5 mg total) by mouth at bedtime. FOR MEMORY   flecainide 100 MG tablet Commonly known as:  TAMBOCOR 100 mg. Take 1/2 tablet 50 mg by mouth twice daily   hydrocortisone 25 MG suppository Commonly known as:  ANUSOL-HC Place 1 suppository (25 mg total) rectally 2 (two) times daily.   IBGARD PO Take 1 tablet by mouth daily as needed (stomach pain).   levETIRAcetam 500 MG tablet Commonly known as:  KEPPRA Take 1 tablet (500 mg total) by mouth 2 (two) times daily for 30 days. What changed:    medication strength  how much to take   levothyroxine 50 MCG tablet Commonly known as:  SYNTHROID, LEVOTHROID Take 1 tablet daily before breakfast   magnesium hydroxide 400 MG/5ML suspension Commonly known as:  MILK OF MAGNESIA Take 15 mLs by mouth daily as needed for mild constipation.   metoprolol succinate 100 MG 24 hr tablet Commonly known as:  TOPROL-XL Take 1 tablet (100 mg total) by mouth daily. Take with or immediately following a meal.   mirtazapine 15 MG tablet Commonly known as:  REMERON Take 1 tablet (15 mg total) by mouth at bedtime.   pantoprazole 40 MG tablet Commonly known as:  PROTONIX Take 1 tablet (40 mg total) by mouth daily.   polyethylene glycol powder powder Commonly known as:  GLYCOLAX/MIRALAX Take 17 g by mouth daily as needed.   traMADol 50 MG tablet Commonly known as:  ULTRAM Take 1 tablet (50 mg total) by mouth every 12 (twelve) hours as needed.   TYLENOL 325 MG Caps Generic drug:  Acetaminophen Take 650 mg by mouth every 6 (six) hours as needed for mild pain.       Contact information for follow-up providers    [place], [place]  Follow up.   Specialty:  [place] Why:  Start of care [facility] nursing/physical therapy/occupational therapy/aide/social worker Contact information: [address] [place] [address] [phone]            Contact information for after-discharge care    Destination    [facility] .   Service:  Skilled Nursing Contact information: [address] [place] [phone]                 Allergies  Allergen Reactions  . Penicillins Rash and Other (See Comments)    Has patient had a PCN reaction causing immediate rash, facial/tongue/throat swelling, SOB or lightheadedness with hypotension: No Has patient had a PCN reaction causing severe rash involving mucus membranes or skin necrosis: No Has patient had a PCN reaction that required hospitalization No Has patient had a PCN reaction occurring within the last 10 years: No If all of the above answers are "NO", then may proceed with Cephalosporin use.   Procedures/Studies: Ct Angio Head W Or Wo Contrast  Result Date: 01/30/2018 CLINICAL DATA:  Code stroke. Sudden onset of aphasia today. Previous CVA. Patient is on warfarin. EXAM: CT ANGIOGRAPHY HEAD AND NECK TECHNIQUE: Multidetector CT imaging of the  head and neck was performed using the standard protocol during bolus administration of intravenous contrast. Multiplanar CT image reconstructions and MIPs were obtained to evaluate the vascular anatomy. Carotid stenosis measurements (when applicable) are obtained utilizing NASCET criteria, using the distal internal carotid diameter as the denominator. CONTRAST:  42mL ISOVUE-370 IOPAMIDOL (ISOVUE-370) INJECTION 76% COMPARISON:  CT head without contrast 12/18/2017. MRI head 03/15/2017. MRA head 03/16/2017. FINDINGS: CTA NECK FINDINGS Aortic arch: A 3 vessel arch configuration is present. Atherosclerotic calcifications present in the distal arch  without aneurysm or stenosis. Right carotid system: The right common carotid artery is within normal limits. Atherosclerotic calcifications are present at the right carotid bifurcation. There is no significant stenosis. There is focal tortuosity of the cervical right ICA with a % stenosis relative to the more distal vessel. No significant stenoses are present. Left carotid system: The left common carotid artery is within normal limits. Left bifurcation is unremarkable. There is moderate tortuosity of the cervical left ICA. A 50% stenosis is present in the mid left ICA relative to the more distal segments. No significant stenosis are present. Vertebral arteries: The vertebral arteries originate from the subclavian arteries bilaterally without significant stenosis. There is some tortuosity. No significant stenosis is present in either vertebral artery in the neck Skeleton: Chronic endplate degenerative changes present at C4-5, C5-6, and C6-7. Osseous foraminal narrowing is worse on the left at these 3 levels. No focal lytic or blastic lesions are present. Other neck: Thyroid is unremarkable. Salivary glands are within normal limits. No significant adenopathy is present. No focal mucosal or submucosal lesions are present. Upper chest: The lung apices are clear without focal nodule, mass, or airspace disease. Thoracic inlet is within normal limits. Review of the MIP images confirms the above findings CTA HEAD FINDINGS Anterior circulation: Atherosclerotic calcifications present within the cavernous internal carotid arteries bilaterally. The A1 and M1 segments are normal. The anterior communicating artery is patent. ACA and MCA branch vessels are normal. MCA bifurcations are unremarkable. Posterior circulation: The vertebral arteries are codominant. PICA origins are visualized and normal. The vertebrobasilar junction is normal. Posterior cerebral arteries originate from the basilar tip bilaterally. Venous sinuses: Dural  sinuses are patent. Straight sinus deep cerebral veins are intact. Cortical veins are within normal limits. Anatomic variants: None Delayed phase: Delayed images demonstrate no pathologic enhancement. White matter disease is exaggerated. Review of the MIP images confirms the above findings IMPRESSION: 1. No emergent large vessel occlusion. 2. Diffuse white matter disease. 3. Tortuosity of the cervical internal carotid arteries bilaterally with narrowing of 50% or less relative to the more distal vessels. 4. Atherosclerotic calcifications at the cavernous internal carotid arteries bilaterally without significant stenosis. 5. Degenerative changes in the cervical spine. Electronically Signed   By: San Morelle M.D.   On: 01/30/2018 15:54   Ct Angio Neck W And/or Wo Contrast  Result Date: 01/30/2018 CLINICAL DATA:  Code stroke. Sudden onset of aphasia today. Previous CVA. Patient is on warfarin. EXAM: CT ANGIOGRAPHY HEAD AND NECK TECHNIQUE: Multidetector CT imaging of the head and neck was performed using the standard protocol during bolus administration of intravenous contrast. Multiplanar CT image reconstructions and MIPs were obtained to evaluate the vascular anatomy. Carotid stenosis measurements (when applicable) are obtained utilizing NASCET criteria, using the distal internal carotid diameter as the denominator. CONTRAST:  36mL ISOVUE-370 IOPAMIDOL (ISOVUE-370) INJECTION 76% COMPARISON:  CT head without contrast 12/18/2017. MRI head 03/15/2017. MRA head 03/16/2017. FINDINGS: CTA NECK FINDINGS Aortic arch: A 3 vessel arch  configuration is present. Atherosclerotic calcifications present in the distal arch without aneurysm or stenosis. Right carotid system: The right common carotid artery is within normal limits. Atherosclerotic calcifications are present at the right carotid bifurcation. There is no significant stenosis. There is focal tortuosity of the cervical right ICA with a % stenosis relative to  the more distal vessel. No significant stenoses are present. Left carotid system: The left common carotid artery is within normal limits. Left bifurcation is unremarkable. There is moderate tortuosity of the cervical left ICA. A 50% stenosis is present in the mid left ICA relative to the more distal segments. No significant stenosis are present. Vertebral arteries: The vertebral arteries originate from the subclavian arteries bilaterally without significant stenosis. There is some tortuosity. No significant stenosis is present in either vertebral artery in the neck Skeleton: Chronic endplate degenerative changes present at C4-5, C5-6, and C6-7. Osseous foraminal narrowing is worse on the left at these 3 levels. No focal lytic or blastic lesions are present. Other neck: Thyroid is unremarkable. Salivary glands are within normal limits. No significant adenopathy is present. No focal mucosal or submucosal lesions are present. Upper chest: The lung apices are clear without focal nodule, mass, or airspace disease. Thoracic inlet is within normal limits. Review of the MIP images confirms the above findings CTA HEAD FINDINGS Anterior circulation: Atherosclerotic calcifications present within the cavernous internal carotid arteries bilaterally. The A1 and M1 segments are normal. The anterior communicating artery is patent. ACA and MCA branch vessels are normal. MCA bifurcations are unremarkable. Posterior circulation: The vertebral arteries are codominant. PICA origins are visualized and normal. The vertebrobasilar junction is normal. Posterior cerebral arteries originate from the basilar tip bilaterally. Venous sinuses: Dural sinuses are patent. Straight sinus deep cerebral veins are intact. Cortical veins are within normal limits. Anatomic variants: None Delayed phase: Delayed images demonstrate no pathologic enhancement. White matter disease is exaggerated. Review of the MIP images confirms the above findings IMPRESSION:  1. No emergent large vessel occlusion. 2. Diffuse white matter disease. 3. Tortuosity of the cervical internal carotid arteries bilaterally with narrowing of 50% or less relative to the more distal vessels. 4. Atherosclerotic calcifications at the cavernous internal carotid arteries bilaterally without significant stenosis. 5. Degenerative changes in the cervical spine. Electronically Signed   By: San Morelle M.D.   On: 01/30/2018 15:54   Mr Brain Wo Contrast  Result Date: 01/30/2018 CLINICAL DATA:  Acute onset dysphagia this morning, confusion. EXAM: MRI HEAD WITHOUT CONTRAST TECHNIQUE: Axial diffusion weighted imaging, sagittal T1, axial T2 and axial T2 FLAIR sequences. Per technologist note, patient could not tolerate further imaging and examination was terminated. COMPARISON:  CT HEAD January 30, 2018 and MRI head March 15, 2017. FINDINGS: Moderately motion degraded examination. INTRACRANIAL CONTENTS: No reduced diffusion to suggest acute ischemia. No advanced parenchymal brain volume loss for age. Confluent supratentorial and patchy pontine white matter FLAIR T2 hyperintensities. Old small bilateral cerebellar infarcts. No midline shift, mass effect or definite masses. No abnormal extra-axial fluid collections. 5 mm pineal cyst. VASCULAR: Normal major intracranial vascular flow voids present at skull base. SKULL AND UPPER CERVICAL SPINE: No abnormal sellar expansion. No suspicious calvarial bone marrow signal. Craniocervical junction maintained. SINUSES/ORBITS: The mastoid air-cells and included paranasal sinuses are well-aerated.The included ocular globes and orbital contents are non-suspicious. Status post bilateral ocular lens implants. OTHER: Patient is edentulous. IMPRESSION: 1. Limited 4 sequence moderately motion degraded MRI head: No acute intracranial process. 2. Severe chronic small vessel ischemic changes and  old small cerebellar infarcts. Electronically Signed   By: Elon Alas  M.D.   On: 01/30/2018 21:21   Ct Head Code Stroke Wo Contrast  Result Date: 01/30/2018 CLINICAL DATA:  Code stroke. Sudden onset of aphasia. Previous CVA. Patient on warfarin. EXAM: CT HEAD WITHOUT CONTRAST TECHNIQUE: Contiguous axial images were obtained from the base of the skull through the vertex without intravenous contrast. COMPARISON:  CT head without contrast 12/18/2017 FINDINGS: Brain: Advanced atrophy and white matter changes are present bilaterally. Acute hemorrhage is present. No acute cortical infarct is present. Basal ganglia are stable. Insular ribbon is intact bilaterally. Brainstem and cerebellum are. Vascular: Blood vessels are uniformly hyperdense. No focal asymmetry is evident. Skull: Calvarium is intact. No focal lytic or blastic lesions are present. Sinuses/Orbits: The paranasal sinuses and mastoid air cells are clear. The globes and orbits are within normal limits. ASPECTS [facility] Stroke Program Early CT Score) - Ganglionic level infarction (caudate, lentiform nuclei, internal capsule, insula, M1-M3 cortex): 7/7 - Supraganglionic infarction (M4-M6 cortex): 3/3 Total score (0-10 with 10 being normal): 10/10 IMPRESSION: 1. No acute intracranial abnormality or significant interval change. 2. Stable advanced atrophy and white matter disease. 3. Blood vessels are uniformly hyperdense without focal asymmetry to suggest thrombus. 4. ASPECTS is 10/10 These results were called by telephone at the time of interpretation on 01/30/2018 at 12:20 pm to Dr. Charlesetta Shanks , who verbally acknowledged these results. Electronically Signed   By: San Morelle M.D.   On: 01/30/2018 12:21    Subjective: Eager to be discharged  Discharge Exam: Vitals:   02/03/18 0556 02/03/18 1033  BP: 114/82 136/75  Pulse: 69 62  Resp: 14   Temp: 98 F (36.7 C)   SpO2: 95%    Vitals:   02/02/18 2146 02/03/18 0204 02/03/18 0556 02/03/18 1033  BP: (!) 150/98 (!) 143/88 114/82 136/75  Pulse: 70 69 69 62   Resp: (!) 21 16 14    Temp: 97.8 F (36.6 C) 98 F (36.7 C) 98 F (36.7 C)   TempSrc: Oral Oral Oral   SpO2: 95% 96% 95%   Weight:      Height:        General: Pt is alert, awake, not in acute distress Cardiovascular: RRR, S1/S2 +, no rubs, no gallops Respiratory: CTA bilaterally, no wheezing, no rhonchi Abdominal: Soft, NT, ND, bowel sounds + Extremities: no edema, no cyanosis   The results of significant diagnostics from this hospitalization (including imaging, microbiology, ancillary and laboratory) are listed below for reference.     Microbiology: No results found for this or any previous visit (from the past 240 hour(s)).   Labs: BNP (last 3 results) No results for input(s): BNP in the last 8760 hours. Basic Metabolic Panel: Recent Labs  Lab 01/30/18 1212 02/01/18 0629 02/02/18 0543  NA 134* 128* 133*  K 4.2 3.1* 4.3  CL 97* 94* 100  CO2 25 23 23   GLUCOSE 115* 96 95  BUN 10 14 14   CREATININE 0.59 0.59 0.54  CALCIUM 8.5* 8.1* 8.4*  MG  --  1.7  --    Liver Function Tests: Recent Labs  Lab 01/30/18 1212  AST 28  ALT 18  ALKPHOS 54  BILITOT 1.0  PROT 7.2  ALBUMIN 3.8   No results for input(s): LIPASE, AMYLASE in the last 168 hours. Recent Labs  Lab 01/30/18 1309  AMMONIA 13   CBC: Recent Labs  Lab 01/30/18 1212 02/01/18 0629  WBC 7.8 8.8  NEUTROABS 5.5  --  HGB 14.0 13.6  HCT 44.0 41.8  MCV 95.2 93.5  PLT 301 291   Cardiac Enzymes: Recent Labs  Lab 01/30/18 1212  TROPONINI <0.03   BNP: Invalid input(s): POCBNP CBG: Recent Labs  Lab 01/31/18 1336  GLUCAP 106*   D-Dimer No results for input(s): DDIMER in the last 72 hours. Hgb A1c No results for input(s): HGBA1C in the last 72 hours. Lipid Profile No results for input(s): CHOL, HDL, LDLCALC, TRIG, CHOLHDL, LDLDIRECT in the last 72 hours. Thyroid function studies No results for input(s): TSH, T4TOTAL, T3FREE, THYROIDAB in the last 72 hours.  Invalid input(s):  FREET3 Anemia work up No results for input(s): VITAMINB12, FOLATE, FERRITIN, TIBC, IRON, RETICCTPCT in the last 72 hours. Urinalysis    Component Value Date/Time   COLORURINE YELLOW 01/30/2018 1358   APPEARANCEUR CLEAR 01/30/2018 1358   APPEARANCEUR Clear 01/06/2018 1507   LABSPEC 1.010 01/30/2018 1358   PHURINE 8.0 01/30/2018 1358   GLUCOSEU NEGATIVE 01/30/2018 1358   HGBUR SMALL (A) 01/30/2018 1358   BILIRUBINUR NEGATIVE 01/30/2018 1358   BILIRUBINUR Negative 01/06/2018 1507   KETONESUR 5 (A) 01/30/2018 1358   PROTEINUR NEGATIVE 01/30/2018 1358   UROBILINOGEN 0.2 11/09/2010 1235   NITRITE NEGATIVE 01/30/2018 1358   LEUKOCYTESUR NEGATIVE 01/30/2018 1358   LEUKOCYTESUR Trace (A) 01/06/2018 1507   Sepsis Labs Invalid input(s): PROCALCITONIN,  WBC,  LACTICIDVEN Microbiology No results found for this or any previous visit (from the past 240 hour(s)).  Time spent: 85min  SIGNED:   Marylu Lund, MD  [facility] 02/03/2018, 2:42 PM  If 7PM-7AM, please contact night-coverage

## 2018-02-02 NOTE — Evaluation (Signed)
Speech Language Pathology Evaluation Patient Details Name: [name] MRN: [id number] DOB: 04-Feb-1932 Today's Date: 02/02/2018 Time: [id number] SLP Time Calculation (min) (ACUTE ONLY): 46 min  Problem List:  Patient Active Problem List   Diagnosis Date Noted  . Aphasia 01/30/2018  . CVA (cerebral vascular accident) ([place]) 01/30/2018  . History of Clostridioides difficile colitis 12/23/2017  . Mild dementia ([place]) 12/23/2017  . History of TIA (transient ischemic attack) 03/15/2017  . Osteoporosis 02/24/2017  . Prolonged QT interval 01/28/2017  . Unstable angina ([place]) 06/16/2016  . Atrial flutter () 06/16/2016  . Overactive bladder 05/02/2015  . History of CVA (cerebrovascular accident) 04/21/2015  . History of dysarthria 02/20/2015  . HTN (hypertension) 01/11/2015  . Hyponatremia 01/11/2015  . Chronic pain syndrome 11/25/2014  . Insomnia 11/25/2014  . Chronic diastolic CHF (congestive heart failure) ([place]) 11/12/2014  . Paroxysmal atrial fibrillation ([place]) 10/14/2014  . Warfarin anticoagulation   . Hypothyroidism   . Carotid bruit   . S/P knee replacement   . DIVERTICULOSIS-COLON 01/19/2010   Past Medical History:  Past Medical History:  Diagnosis Date  . Anemia   . Atrial fibrillation (HCC)    Paroxysmal, Flecainide therapy  . Calf pain    September, 2012, at rest  . Carotid bruit    Doppler, December, 2009, no abnormality  . Cataract   . Diverticulosis   . GERD (gastroesophageal reflux disease)   . History of shingles   . HLD (hyperlipidemia)   . HTN (hypertension)   . Hypothyroidism   . Insomnia   . Osteoarthritis   . PONV (postoperative nausea and vomiting)   . Rectal bleeding 2001   diverticulosis and int hemorrhoids on 07/1999 and 02/2010 colonoscopies.  . Stroke ([place])   . Warfarin anticoagulation    Past Surgical History:  Past Surgical History:  Procedure Laterality Date  . BIOPSY  09/29/2015   Procedure: BIOPSY;  Surgeon: Rogene Houston, MD;   [facility];  Service: Endoscopy;;  gastric  . CATARACT EXTRACTION W/PHACO Right 04/15/2014   Procedure: CATARACT EXTRACTION PHACO AND INTRAOCULAR LENS PLACEMENT (IOC);  Surgeon: Tonny Branch, MD;  Location: [facility] ORS;  Service: Ophthalmology;  Laterality: Right;  CDE: 13.30  . CATARACT EXTRACTION W/PHACO Left 05/13/2014   Procedure: CATARACT EXTRACTION PHACO AND INTRAOCULAR LENS PLACEMENT LEFT EYE;  Surgeon: Tonny Branch, MD;  Location: [facility];  Service: Ophthalmology;  Laterality: Left;  CDE:13.00  . CHOLECYSTECTOMY N/A 01/15/2015   Procedure: LAPAROSCOPIC CHOLECYSTECTOMY;  Surgeon: Ralene Ok, MD;  Location: [place];  Service: General;  Laterality: N/A;  . COLONOSCOPY N/A 09/29/2015   Procedure: COLONOSCOPY;  Surgeon: Rogene Houston, MD;  [facility];  Service: Endoscopy;  Laterality: N/A;  . COLONOSCOPY    . ESOPHAGOGASTRODUODENOSCOPY N/A 01/13/2015   Procedure: ESOPHAGOGASTRODUODENOSCOPY (EGD);  Surgeon: Jerene Bears, MD;  Location: [facility] ENDOSCOPY;  Service: Endoscopy;  Laterality: N/A;  . ESOPHAGOGASTRODUODENOSCOPY N/A 09/29/2015   Procedure: ESOPHAGOGASTRODUODENOSCOPY (EGD);  Surgeon: Rogene Houston, MD;  [facility];  Service: Endoscopy;  Laterality: N/A;  12:15  . GALLBLADDER SURGERY    . LEFT HEART CATH AND CORONARY ANGIOGRAPHY N/A 06/22/2016   Procedure: Left Heart Cath and Coronary Angiography;  Surgeon: Troy Sine, MD;  Location: [place] CV LAB;  Service: Cardiovascular;  Laterality: N/A;  . SHOULDER OPEN ROTATOR CUFF REPAIR     bilateral  . TOTAL KNEE ARTHROPLASTY  05/26/10   right  . TOTAL KNEE ARTHROPLASTY  11/17/2010   Procedure:  TOTAL KNEE ARTHROPLASTY;  Surgeon: Mauri Pole;  Location: [facility];  Service: Orthopedics;  Laterality: Left;  . TOTAL KNEE ARTHROPLASTY     Right  . TOTAL SHOULDER REPLACEMENT  01/2010   left   HPI:  85 yo female adm to [facility] with aphasia and dysphagia concerning for stroke.  PMH + for dementia, GERD, CVA,  shingles, diverticulosis.  Neuro saw pt with differential diagnosis of ? delirium, metabolic encephalopathy, acute ischemic infarct.   Pt MRI showed cerebellar infarcts and severe ischemia.     Assessment / Plan / Recommendation Clinical Impression  Patient's family not present at this time to establish baseline however suspect pt is near or at her baseline given her dementia.  Speech is clear with no indication of dysarthria.  Minimal facial asymmetry on left indicative of facial nerve deficits.  Note prior h/o cva.  She is able to effectively communicate her needs/wishes verbally and able to write specific question she desired answered.  Memory prospective, short term, attention impaired impacting her receptive and expressive language skills.  Established compensatory strategy with pt to recall her important questions however she will significant reinforcement in hopes to generalize skill.  Recommend consider short term follow up to help establish routine in her SNF.      SLP Assessment  SLP Recommendation/Assessment: Patient needs continued Speech Lanaguage Pathology Services    Follow Up Recommendations  Skilled Nursing facility    Frequency and Duration     n/a in acute setting as she is able to recall need to use call bell      SLP Evaluation Cognition  Overall Cognitive Status: No family/caregiver present to determine baseline cognitive functioning Arousal/Alertness: Awake/alert Orientation Level: Oriented to person;Disoriented to place;Disoriented to time;Oriented to situation Attention: Sustained Sustained Attention: Impaired Sustained Attention Impairment: Verbal basic(lengthier commands) Memory: Impaired Memory Impairment: Storage deficit;Retrieval deficit;Decreased recall of new information;Decreased short term memory;Prospective memory Decreased Short Term Memory: Verbal basic(did recall need to use call bell) Awareness: Impaired Awareness Impairment: Intellectual  impairment Problem Solving: Impaired Problem Solving Impairment: Functional basic Safety/Judgment: Impaired       Comprehension  Auditory Comprehension Overall Auditory Comprehension: Appears within functional limits for tasks assessed Yes/No Questions: Not tested Commands: Impaired One Step Basic Commands: 75-100% accurate Two Step Basic Commands: 50-74% accurate Conversation: Simple Interfering Components: Attention;Processing speed;Working Curator: Not tested Reading Comprehension Reading Status: Within funtional limits(pt read sentences pertinent to her care plan)    Expression Expression Primary Mode of Expression: Verbal Verbal Expression Overall Verbal Expression: Appears within functional limits for tasks assessed Initiation: No impairment Level of Generative/Spontaneous Verbalization: Sentence Repetition: No impairment Naming: Impairment(5/6 items, octopus- able to read correct name and identify) Pragmatics: No impairment Interfering Components: Attention Written Expression Dominant Hand: Right Written Expression: Within Functional Limits(functional for her needs - wrote quesetion for MD)   Oral / Motor  Oral Motor/Sensory Function Overall Oral Motor/Sensory Function: Other (comment)(minimal lower facial asymmetry) Facial Symmetry: Abnormal symmetry left;Suspected CN VII (facial) dysfunction Motor Speech Overall Motor Speech: Appears within functional limits for tasks assessed Respiration: Within functional limits Phonation: Normal Resonance: Within functional limits Articulation: Within functional limitis Intelligibility: Intelligible Motor Planning: Witnin functional limits   GO                    Macario Golds 02/02/2018, 1:12 PM   Luanna Salk, [facility] SLP Acute Rehab Services Pager [phone] Office [phone]

## 2018-02-02 NOTE — Progress Notes (Signed)
Called pt's son Ray with update on discharge plan. Stacey Drain

## 2018-02-02 NOTE — Plan of Care (Signed)
  Problem: Education: Goal: Knowledge of General Education information will improve Description Including pain rating scale, medication(s)/side effects and non-pharmacologic comfort measures Outcome: Adequate for Discharge  Pt's family is involved in education and care plan.  Currently TOC plan is to SNF rehab.

## 2018-02-03 ENCOUNTER — Ambulatory Visit: Payer: Medicare Other | Admitting: Family Medicine

## 2018-02-03 DIAGNOSIS — R4701 Aphasia: Secondary | ICD-10-CM | POA: Diagnosis not present

## 2018-02-03 DIAGNOSIS — I5032 Chronic diastolic (congestive) heart failure: Secondary | ICD-10-CM | POA: Diagnosis not present

## 2018-02-03 DIAGNOSIS — G894 Chronic pain syndrome: Secondary | ICD-10-CM | POA: Diagnosis not present

## 2018-02-03 DIAGNOSIS — I480 Paroxysmal atrial fibrillation: Secondary | ICD-10-CM | POA: Diagnosis not present

## 2018-02-03 DIAGNOSIS — F4489 Other dissociative and conversion disorders: Secondary | ICD-10-CM | POA: Diagnosis not present

## 2018-02-03 MED ORDER — APIXABAN 2.5 MG PO TABS: 2.5000 mg | ORAL_TABLET | Freq: Two times a day (BID) | ORAL | 0 refills | Status: DC

## 2018-02-03 MED ORDER — LEVETIRACETAM 500 MG PO TABS: 500.0000 mg | ORAL_TABLET | Freq: Two times a day (BID) | ORAL | 0 refills | Status: DC

## 2018-02-03 NOTE — Progress Notes (Signed)
Clinical Social Worker following patient for support and discharge needs. CSW spoke with [facility] admission coordinator for [employer] and Rehab. Per facility [facility] Medicare denied patients request to go to a rehab facility. RNCM and MD aware of patients denial, patient will have to discharge home with Home Health. CSW signing off as social work needs have been met.   [name], MSW,  [place]

## 2018-02-03 NOTE — Care Management Note (Signed)
Case Management Note  Patient Details  Name: [name] MRN: [id number] Date of Birth: Nov 03, 1932  Subjective/Objective:[facility] & several other [place] agencies unable to provide HHC-staffing/insurance/location. [facility] able to provide [facility] services [place] vm with son [name] [phone] if he agrees to d/c plan-await call back/MD in agreement. Son able to transport home on own.                  Action/Plan:dc home w/HHC.   Expected Discharge Date:  02/02/18               Expected Discharge Plan:  [place]  In-House Referral:  Clinical Social Work  Discharge planning Services  CM Consult  Post Acute Care Choice:  Durable Medical Equipment(rw,3n1,cane) Choice offered to:  Adult Children  DME Arranged:    DME Agency:     HH Arranged:  RN, PT, OT, Nurse's Aide, Social Work [employer] Agency:  [place]  Status of Service:  Completed, signed off  If discussed at [employer], dates discussed:    Additional Comments:  Dessa Phi, RN 02/03/2018, 12:37 PM

## 2018-02-03 NOTE — Progress Notes (Signed)
PROGRESS NOTE    [name]  CLE:[id number] DOB: February 07, 1932 DOA: 01/30/2018 PCP: Dettinger, Fransisca Kaufmann, MD    Brief Narrative:  85 y.o.femalewith medical history significant ofparoxysmal atrial fibrillation/flutter on warfarin anticoagulation, history of TIAs, dementia, chronic diastolic grade 1 heart failure (03/16/2017 echo), nonobstructive coronary artery disease by catheterization on 06/22/2016, hyperlipidemia, hypertension, seizure disorder, Hypothyroidismwho presents from home with confusion and possible aphasia concerning for stroke.  Assessment & Plan:   Active Problems:   Warfarin anticoagulation   Hypothyroidism   S/P knee replacement   Paroxysmal atrial fibrillation (HCC)   Chronic diastolic CHF (congestive heart failure) (HCC)   Chronic pain syndrome   HTN (hypertension)   History of CVA (cerebrovascular accident)   Osteoporosis   History of TIA (transient ischemic attack)   Mild dementia ([place])   Aphasia   CVA (cerebral vascular accident) ([place])  #) Aphasia/acute encephalopathy secondary to TIA vs seizure: -Improved -Neurology consulted,Discussed with Neurology -EEGunremarkable -MRI brainshows no evidence of stroke -PT consult pending -Echo1/28/2020unremarkable with EF of 60-65% -Per RN, ambulated to elevator. Will discharge home with home health  #) Seizure disorder: -Discussed with Neurology. Recommendation to increase keppra to 500mg  bid -Recommendation for close outpatient follow up with Neurology on discharge -Patient aware not to drive for 6 months or when cleared by Neurology  #) Paroxysmal atrial fibrillation on warfarin, RVR -Discontinue warfarin, start apixaban 5 mg twice daily -Continue home digoxin 0.125 mcg daily -Continued flecainide 50 mg twice daily -Resumed beta blocker today. -HR briefly up to the 140's improved wtih PRN IV labetalol ordered and resumption of metoprolol  #) Hypertension/hyperlipidemia/nonobstructive  coronary artery disease: -Continue atorvastatin 20 mg daily -Irbesartan 150 mg dailynot ordered, will hold for now given soft BP this admit -continued beta blocker  #) Dementia: -Continue donepezil 5 mg daily  #) Hypothyroidism: -Continue levothyroxine 50 mcg daily -TSHnormalat present  #) Pain/psych: -Continue mirtazapine 15 mg nightly  Consultants:   Neurology  Procedures:   EEG  Antimicrobials: Anti-infectives (From admission, onward)   None       Subjective: Eager to go home  Objective: Vitals:   02/02/18 2146 02/03/18 0204 02/03/18 0556 02/03/18 1033  BP: (!) 150/98 (!) 143/88 114/82 136/75  Pulse: 70 69 69 62  Resp: (!) 21 16 14    Temp: 97.8 F (36.6 C) 98 F (36.7 C) 98 F (36.7 C)   TempSrc: Oral Oral Oral   SpO2: 95% 96% 95%   Weight:      Height:        Intake/Output Summary (Last 24 hours) at 02/03/2018 1438 Last data filed at 02/02/2018 1700 Gross per 24 hour  Intake 60 ml  Output -  Net 60 ml   Filed Weights   01/30/18 1125  Weight: 59 kg    Examination:  General exam: Appears calm and comfortable  Respiratory system: Clear to auscultation. Respiratory effort normal.   Data Reviewed: I have personally reviewed following labs and imaging studies  CBC: Recent Labs  Lab 01/30/18 1212 02/01/18 0629  WBC 7.8 8.8  NEUTROABS 5.5  --   HGB 14.0 13.6  HCT 44.0 41.8  MCV 95.2 93.5  PLT 301 944   Basic Metabolic Panel: Recent Labs  Lab 01/30/18 1212 02/01/18 0629 02/02/18 0543  NA 134* 128* 133*  K 4.2 3.1* 4.3  CL 97* 94* 100  CO2 25 23 23   GLUCOSE 115* 96 95  BUN 10 14 14   CREATININE 0.59 0.59 0.54  CALCIUM 8.5* 8.1* 8.4*  MG  --  1.7  --    GFR: Estimated Creatinine Clearance: 44.1 mL/min (by C-G formula based on SCr of 0.54 mg/dL). Liver Function Tests: Recent Labs  Lab 01/30/18 1212  AST 28  ALT 18  ALKPHOS 54  BILITOT 1.0  PROT 7.2  ALBUMIN 3.8   No results for input(s): LIPASE, AMYLASE in the  last 168 hours. Recent Labs  Lab 01/30/18 1309  AMMONIA 13   Coagulation Profile: Recent Labs  Lab 01/30/18 1212 01/31/18 0557  INR 1.60 1.44   Cardiac Enzymes: Recent Labs  Lab 01/30/18 1212  TROPONINI <0.03   BNP (last 3 results) No results for input(s): PROBNP in the last 8760 hours. HbA1C: No results for input(s): HGBA1C in the last 72 hours. CBG: Recent Labs  Lab 01/31/18 1336  GLUCAP 106*   Lipid Profile: No results for input(s): CHOL, HDL, LDLCALC, TRIG, CHOLHDL, LDLDIRECT in the last 72 hours. Thyroid Function Tests: No results for input(s): TSH, T4TOTAL, FREET4, T3FREE, THYROIDAB in the last 72 hours. Anemia Panel: No results for input(s): VITAMINB12, FOLATE, FERRITIN, TIBC, IRON, RETICCTPCT in the last 72 hours. Sepsis Labs: No results for input(s): PROCALCITON, LATICACIDVEN in the last 168 hours.  No results found for this or any previous visit (from the past 240 hour(s)).   Radiology Studies: No results found.  Scheduled Meds: .  stroke: mapping our early stages of recovery book   Does not apply Once  . apixaban  2.5 mg Oral BID  . atorvastatin  20 mg Oral QHS  . digoxin  0.125 mg Oral Daily  . docusate sodium  100 mg Oral Daily  . donepezil  5 mg Oral QHS  . flecainide  50 mg Oral Q12H  . levETIRAcetam  500 mg Oral BID  . levothyroxine  50 mcg Oral Q0600  . metoprolol succinate  100 mg Oral Daily  . mirtazapine  15 mg Oral QHS  . pantoprazole  40 mg Oral Daily   Continuous Infusions:   LOS: 0 days   Marylu Lund, MD [facility] Pager On Amion  If 7PM-7AM, please contact night-coverage 02/03/2018, 2:38 PM

## 2018-02-03 NOTE — Plan of Care (Signed)
Pt ambulated in the hall w/ PT on 1st shift.

## 2018-02-03 NOTE — Care Management Obs Status (Signed)
[place] NOTIFICATION   Patient Details  Name: KORRA CHRISTINE MRN: [id number] Date of Birth: Apr 16, 1932   Medicare Observation Status Notification Given:  Yes    MahabirJuliann Pulse, RN 02/03/2018, 12:03 PM

## 2018-02-04 NOTE — Telephone Encounter (Signed)
ED CM received call from [facility] with [facility] concerning needing [place] [facility] orders. Requesting fax referral, to [phone]. No further ED CM needs identified.

## 2018-02-05 DIAGNOSIS — Z950 Presence of cardiac pacemaker: Secondary | ICD-10-CM | POA: Diagnosis not present

## 2018-02-05 DIAGNOSIS — I2510 Atherosclerotic heart disease of native coronary artery without angina pectoris: Secondary | ICD-10-CM | POA: Diagnosis not present

## 2018-02-05 DIAGNOSIS — I5032 Chronic diastolic (congestive) heart failure: Secondary | ICD-10-CM | POA: Diagnosis not present

## 2018-02-05 DIAGNOSIS — G894 Chronic pain syndrome: Secondary | ICD-10-CM | POA: Diagnosis not present

## 2018-02-05 DIAGNOSIS — I69320 Aphasia following cerebral infarction: Secondary | ICD-10-CM | POA: Diagnosis not present

## 2018-02-05 DIAGNOSIS — I872 Venous insufficiency (chronic) (peripheral): Secondary | ICD-10-CM | POA: Diagnosis not present

## 2018-02-05 DIAGNOSIS — Z9181 History of falling: Secondary | ICD-10-CM | POA: Diagnosis not present

## 2018-02-05 DIAGNOSIS — M810 Age-related osteoporosis without current pathological fracture: Secondary | ICD-10-CM | POA: Diagnosis not present

## 2018-02-05 DIAGNOSIS — Z8673 Personal history of transient ischemic attack (TIA), and cerebral infarction without residual deficits: Secondary | ICD-10-CM | POA: Diagnosis not present

## 2018-02-05 DIAGNOSIS — I110 Hypertensive heart disease with heart failure: Secondary | ICD-10-CM | POA: Diagnosis not present

## 2018-02-05 DIAGNOSIS — I480 Paroxysmal atrial fibrillation: Secondary | ICD-10-CM | POA: Diagnosis not present

## 2018-02-07 DIAGNOSIS — M810 Age-related osteoporosis without current pathological fracture: Secondary | ICD-10-CM | POA: Diagnosis not present

## 2018-02-07 DIAGNOSIS — I110 Hypertensive heart disease with heart failure: Secondary | ICD-10-CM | POA: Diagnosis not present

## 2018-02-07 DIAGNOSIS — I2510 Atherosclerotic heart disease of native coronary artery without angina pectoris: Secondary | ICD-10-CM | POA: Diagnosis not present

## 2018-02-07 DIAGNOSIS — Z950 Presence of cardiac pacemaker: Secondary | ICD-10-CM | POA: Diagnosis not present

## 2018-02-07 DIAGNOSIS — Z9181 History of falling: Secondary | ICD-10-CM | POA: Diagnosis not present

## 2018-02-07 DIAGNOSIS — G894 Chronic pain syndrome: Secondary | ICD-10-CM | POA: Diagnosis not present

## 2018-02-07 DIAGNOSIS — I5032 Chronic diastolic (congestive) heart failure: Secondary | ICD-10-CM | POA: Diagnosis not present

## 2018-02-07 DIAGNOSIS — I480 Paroxysmal atrial fibrillation: Secondary | ICD-10-CM | POA: Diagnosis not present

## 2018-02-07 DIAGNOSIS — I69320 Aphasia following cerebral infarction: Secondary | ICD-10-CM | POA: Diagnosis not present

## 2018-02-07 DIAGNOSIS — Z8673 Personal history of transient ischemic attack (TIA), and cerebral infarction without residual deficits: Secondary | ICD-10-CM | POA: Diagnosis not present

## 2018-02-07 DIAGNOSIS — I872 Venous insufficiency (chronic) (peripheral): Secondary | ICD-10-CM | POA: Diagnosis not present

## 2018-02-09 DIAGNOSIS — I2510 Atherosclerotic heart disease of native coronary artery without angina pectoris: Secondary | ICD-10-CM | POA: Diagnosis not present

## 2018-02-09 DIAGNOSIS — G894 Chronic pain syndrome: Secondary | ICD-10-CM | POA: Diagnosis not present

## 2018-02-09 DIAGNOSIS — Z8673 Personal history of transient ischemic attack (TIA), and cerebral infarction without residual deficits: Secondary | ICD-10-CM | POA: Diagnosis not present

## 2018-02-09 DIAGNOSIS — I5032 Chronic diastolic (congestive) heart failure: Secondary | ICD-10-CM | POA: Diagnosis not present

## 2018-02-09 DIAGNOSIS — I110 Hypertensive heart disease with heart failure: Secondary | ICD-10-CM | POA: Diagnosis not present

## 2018-02-09 DIAGNOSIS — I872 Venous insufficiency (chronic) (peripheral): Secondary | ICD-10-CM | POA: Diagnosis not present

## 2018-02-09 DIAGNOSIS — M810 Age-related osteoporosis without current pathological fracture: Secondary | ICD-10-CM | POA: Diagnosis not present

## 2018-02-09 DIAGNOSIS — I69320 Aphasia following cerebral infarction: Secondary | ICD-10-CM | POA: Diagnosis not present

## 2018-02-09 DIAGNOSIS — I480 Paroxysmal atrial fibrillation: Secondary | ICD-10-CM | POA: Diagnosis not present

## 2018-02-09 DIAGNOSIS — Z950 Presence of cardiac pacemaker: Secondary | ICD-10-CM | POA: Diagnosis not present

## 2018-02-09 DIAGNOSIS — Z9181 History of falling: Secondary | ICD-10-CM | POA: Diagnosis not present

## 2018-02-10 DIAGNOSIS — Z8673 Personal history of transient ischemic attack (TIA), and cerebral infarction without residual deficits: Secondary | ICD-10-CM | POA: Diagnosis not present

## 2018-02-10 DIAGNOSIS — I872 Venous insufficiency (chronic) (peripheral): Secondary | ICD-10-CM | POA: Diagnosis not present

## 2018-02-10 DIAGNOSIS — I69320 Aphasia following cerebral infarction: Secondary | ICD-10-CM | POA: Diagnosis not present

## 2018-02-10 DIAGNOSIS — I5032 Chronic diastolic (congestive) heart failure: Secondary | ICD-10-CM | POA: Diagnosis not present

## 2018-02-10 DIAGNOSIS — I2510 Atherosclerotic heart disease of native coronary artery without angina pectoris: Secondary | ICD-10-CM | POA: Diagnosis not present

## 2018-02-10 DIAGNOSIS — I110 Hypertensive heart disease with heart failure: Secondary | ICD-10-CM | POA: Diagnosis not present

## 2018-02-10 DIAGNOSIS — Z9181 History of falling: Secondary | ICD-10-CM | POA: Diagnosis not present

## 2018-02-10 DIAGNOSIS — M810 Age-related osteoporosis without current pathological fracture: Secondary | ICD-10-CM | POA: Diagnosis not present

## 2018-02-10 DIAGNOSIS — Z950 Presence of cardiac pacemaker: Secondary | ICD-10-CM | POA: Diagnosis not present

## 2018-02-10 DIAGNOSIS — G894 Chronic pain syndrome: Secondary | ICD-10-CM | POA: Diagnosis not present

## 2018-02-10 DIAGNOSIS — I480 Paroxysmal atrial fibrillation: Secondary | ICD-10-CM | POA: Diagnosis not present

## 2018-02-11 DIAGNOSIS — I480 Paroxysmal atrial fibrillation: Secondary | ICD-10-CM | POA: Diagnosis not present

## 2018-02-11 DIAGNOSIS — I872 Venous insufficiency (chronic) (peripheral): Secondary | ICD-10-CM | POA: Diagnosis not present

## 2018-02-11 DIAGNOSIS — I5032 Chronic diastolic (congestive) heart failure: Secondary | ICD-10-CM | POA: Diagnosis not present

## 2018-02-11 DIAGNOSIS — G894 Chronic pain syndrome: Secondary | ICD-10-CM | POA: Diagnosis not present

## 2018-02-11 DIAGNOSIS — I2510 Atherosclerotic heart disease of native coronary artery without angina pectoris: Secondary | ICD-10-CM | POA: Diagnosis not present

## 2018-02-11 DIAGNOSIS — Z950 Presence of cardiac pacemaker: Secondary | ICD-10-CM | POA: Diagnosis not present

## 2018-02-11 DIAGNOSIS — I110 Hypertensive heart disease with heart failure: Secondary | ICD-10-CM | POA: Diagnosis not present

## 2018-02-11 DIAGNOSIS — Z9181 History of falling: Secondary | ICD-10-CM | POA: Diagnosis not present

## 2018-02-11 DIAGNOSIS — M810 Age-related osteoporosis without current pathological fracture: Secondary | ICD-10-CM | POA: Diagnosis not present

## 2018-02-11 DIAGNOSIS — Z8673 Personal history of transient ischemic attack (TIA), and cerebral infarction without residual deficits: Secondary | ICD-10-CM | POA: Diagnosis not present

## 2018-02-11 DIAGNOSIS — I69320 Aphasia following cerebral infarction: Secondary | ICD-10-CM | POA: Diagnosis not present

## 2018-02-13 DIAGNOSIS — I872 Venous insufficiency (chronic) (peripheral): Secondary | ICD-10-CM | POA: Diagnosis not present

## 2018-02-13 DIAGNOSIS — I5032 Chronic diastolic (congestive) heart failure: Secondary | ICD-10-CM | POA: Diagnosis not present

## 2018-02-13 DIAGNOSIS — G894 Chronic pain syndrome: Secondary | ICD-10-CM | POA: Diagnosis not present

## 2018-02-13 DIAGNOSIS — I69320 Aphasia following cerebral infarction: Secondary | ICD-10-CM | POA: Diagnosis not present

## 2018-02-13 DIAGNOSIS — I480 Paroxysmal atrial fibrillation: Secondary | ICD-10-CM | POA: Diagnosis not present

## 2018-02-13 DIAGNOSIS — I110 Hypertensive heart disease with heart failure: Secondary | ICD-10-CM | POA: Diagnosis not present

## 2018-02-13 DIAGNOSIS — M810 Age-related osteoporosis without current pathological fracture: Secondary | ICD-10-CM | POA: Diagnosis not present

## 2018-02-13 DIAGNOSIS — Z950 Presence of cardiac pacemaker: Secondary | ICD-10-CM | POA: Diagnosis not present

## 2018-02-13 DIAGNOSIS — Z9181 History of falling: Secondary | ICD-10-CM | POA: Diagnosis not present

## 2018-02-13 DIAGNOSIS — I2510 Atherosclerotic heart disease of native coronary artery without angina pectoris: Secondary | ICD-10-CM | POA: Diagnosis not present

## 2018-02-13 DIAGNOSIS — Z8673 Personal history of transient ischemic attack (TIA), and cerebral infarction without residual deficits: Secondary | ICD-10-CM | POA: Diagnosis not present

## 2018-02-14 DIAGNOSIS — I480 Paroxysmal atrial fibrillation: Secondary | ICD-10-CM | POA: Diagnosis not present

## 2018-02-14 DIAGNOSIS — M810 Age-related osteoporosis without current pathological fracture: Secondary | ICD-10-CM | POA: Diagnosis not present

## 2018-02-14 DIAGNOSIS — I2510 Atherosclerotic heart disease of native coronary artery without angina pectoris: Secondary | ICD-10-CM | POA: Diagnosis not present

## 2018-02-14 DIAGNOSIS — Z950 Presence of cardiac pacemaker: Secondary | ICD-10-CM | POA: Diagnosis not present

## 2018-02-14 DIAGNOSIS — Z8673 Personal history of transient ischemic attack (TIA), and cerebral infarction without residual deficits: Secondary | ICD-10-CM | POA: Diagnosis not present

## 2018-02-14 DIAGNOSIS — I872 Venous insufficiency (chronic) (peripheral): Secondary | ICD-10-CM | POA: Diagnosis not present

## 2018-02-14 DIAGNOSIS — I5032 Chronic diastolic (congestive) heart failure: Secondary | ICD-10-CM | POA: Diagnosis not present

## 2018-02-14 DIAGNOSIS — G894 Chronic pain syndrome: Secondary | ICD-10-CM | POA: Diagnosis not present

## 2018-02-14 DIAGNOSIS — I110 Hypertensive heart disease with heart failure: Secondary | ICD-10-CM | POA: Diagnosis not present

## 2018-02-14 DIAGNOSIS — Z9181 History of falling: Secondary | ICD-10-CM | POA: Diagnosis not present

## 2018-02-14 DIAGNOSIS — I69320 Aphasia following cerebral infarction: Secondary | ICD-10-CM | POA: Diagnosis not present

## 2018-02-15 DIAGNOSIS — Z950 Presence of cardiac pacemaker: Secondary | ICD-10-CM | POA: Diagnosis not present

## 2018-02-15 DIAGNOSIS — Z9181 History of falling: Secondary | ICD-10-CM | POA: Diagnosis not present

## 2018-02-15 DIAGNOSIS — I69320 Aphasia following cerebral infarction: Secondary | ICD-10-CM | POA: Diagnosis not present

## 2018-02-15 DIAGNOSIS — I5032 Chronic diastolic (congestive) heart failure: Secondary | ICD-10-CM | POA: Diagnosis not present

## 2018-02-15 DIAGNOSIS — I872 Venous insufficiency (chronic) (peripheral): Secondary | ICD-10-CM | POA: Diagnosis not present

## 2018-02-15 DIAGNOSIS — Z8673 Personal history of transient ischemic attack (TIA), and cerebral infarction without residual deficits: Secondary | ICD-10-CM | POA: Diagnosis not present

## 2018-02-15 DIAGNOSIS — I2510 Atherosclerotic heart disease of native coronary artery without angina pectoris: Secondary | ICD-10-CM | POA: Diagnosis not present

## 2018-02-15 DIAGNOSIS — G894 Chronic pain syndrome: Secondary | ICD-10-CM | POA: Diagnosis not present

## 2018-02-15 DIAGNOSIS — I110 Hypertensive heart disease with heart failure: Secondary | ICD-10-CM | POA: Diagnosis not present

## 2018-02-15 DIAGNOSIS — M810 Age-related osteoporosis without current pathological fracture: Secondary | ICD-10-CM | POA: Diagnosis not present

## 2018-02-15 DIAGNOSIS — I480 Paroxysmal atrial fibrillation: Secondary | ICD-10-CM | POA: Diagnosis not present

## 2018-02-16 DIAGNOSIS — Z8673 Personal history of transient ischemic attack (TIA), and cerebral infarction without residual deficits: Secondary | ICD-10-CM | POA: Diagnosis not present

## 2018-02-16 DIAGNOSIS — Z950 Presence of cardiac pacemaker: Secondary | ICD-10-CM | POA: Diagnosis not present

## 2018-02-16 DIAGNOSIS — I110 Hypertensive heart disease with heart failure: Secondary | ICD-10-CM | POA: Diagnosis not present

## 2018-02-16 DIAGNOSIS — M810 Age-related osteoporosis without current pathological fracture: Secondary | ICD-10-CM | POA: Diagnosis not present

## 2018-02-16 DIAGNOSIS — I480 Paroxysmal atrial fibrillation: Secondary | ICD-10-CM | POA: Diagnosis not present

## 2018-02-16 DIAGNOSIS — I2510 Atherosclerotic heart disease of native coronary artery without angina pectoris: Secondary | ICD-10-CM | POA: Diagnosis not present

## 2018-02-16 DIAGNOSIS — G894 Chronic pain syndrome: Secondary | ICD-10-CM | POA: Diagnosis not present

## 2018-02-16 DIAGNOSIS — I872 Venous insufficiency (chronic) (peripheral): Secondary | ICD-10-CM | POA: Diagnosis not present

## 2018-02-16 DIAGNOSIS — Z9181 History of falling: Secondary | ICD-10-CM | POA: Diagnosis not present

## 2018-02-16 DIAGNOSIS — I69320 Aphasia following cerebral infarction: Secondary | ICD-10-CM | POA: Diagnosis not present

## 2018-02-16 DIAGNOSIS — I5032 Chronic diastolic (congestive) heart failure: Secondary | ICD-10-CM | POA: Diagnosis not present

## 2018-02-17 ENCOUNTER — Ambulatory Visit (INDEPENDENT_AMBULATORY_CARE_PROVIDER_SITE_OTHER): Payer: Medicare Other | Admitting: *Deleted

## 2018-02-17 VITALS — BP 167/110 | HR 69 | Temp 96.6°F | Ht 63.0 in | Wt 131.0 lb

## 2018-02-17 DIAGNOSIS — Z0000 Encounter for general adult medical examination without abnormal findings: Secondary | ICD-10-CM

## 2018-02-17 NOTE — Progress Notes (Signed)
Subjective:   [name] is a 85 y.o. female who presents for Medicare Annual (Subsequent) preventive examination. She is a retired Emergency planning/management officer, who was self employed. She enjoys doing word search and puzzles. The only exercise she is currently getting is from the Physical therapist that comes to her home twice a week. She states that her appetite is good and that she eats 3 meals daily. She is currently not active in church. She lives with her sister at night and she has a caregiver with her during the day. She is widowed and she has one son. She also has one dog and fall hazards were discussed. She states her health is somewhat worse at this time, due to her recent stroke and hospitalization.     Cardiac Risk Factors include: none;advanced age (>71men, >75 women)     Objective:     Vitals: BP (!) 167/110 (BP Location: Left Wrist)   Pulse 69   Temp (!) 96.6 F (35.9 C) (Oral)   Ht 5\' 3"  (1.6 m)   Wt 131 lb (59.4 kg)   BMI 23.21 kg/m   Body mass index is 23.21 kg/m.  Advanced Directives 02/17/2018 01/30/2018 11/01/2017 06/24/2017 05/05/2017 03/15/2017 03/15/2017  Does Patient Have a Medical Advance Directive? No No Yes No No No No  Does patient want to make changes to medical advance directive? - - - - - - -  Copy of [place] in Chart? - - - - - - -  Would patient like information on creating a medical advance directive? No - Patient declined No - Patient declined - Yes (MAU/Ambulatory/Procedural Areas - Information given) - Yes (Inpatient - patient requests chaplain consult to create a medical advance directive) No - Patient declined  Pre-existing out of facility DNR order (yellow form or pink MOST form) - - - - - - -    Tobacco Social History   Tobacco Use  Smoking Status Never Smoker  Smokeless Tobacco Never Used     Counseling given: Not Answered   Past Medical History:  Diagnosis Date  . Anemia   . Atrial fibrillation (HCC)    Paroxysmal,  Flecainide therapy  . Calf pain    September, 2012, at rest  . Carotid bruit    Doppler, December, 2009, no abnormality  . Cataract   . Diverticulosis   . GERD (gastroesophageal reflux disease)   . History of shingles   . HLD (hyperlipidemia)   . HTN (hypertension)   . Hypothyroidism   . Insomnia   . Osteoarthritis   . PONV (postoperative nausea and vomiting)   . Rectal bleeding 2001   diverticulosis and int hemorrhoids on 07/1999 and 02/2010 colonoscopies.  . Seizures ([place])   . Stroke ([place])   . Warfarin anticoagulation    Past Surgical History:  Procedure Laterality Date  . BIOPSY  09/29/2015   Procedure: BIOPSY;  Surgeon: Rogene Houston, MD;  [facility];  Service: Endoscopy;;  gastric  . CATARACT EXTRACTION W/PHACO Right 04/15/2014   Procedure: CATARACT EXTRACTION PHACO AND INTRAOCULAR LENS PLACEMENT (IOC);  Surgeon: Tonny Branch, MD;  Location: [facility];  Service: Ophthalmology;  Laterality: Right;  CDE: 13.30  . CATARACT EXTRACTION W/PHACO Left 05/13/2014   Procedure: CATARACT EXTRACTION PHACO AND INTRAOCULAR LENS PLACEMENT LEFT EYE;  Surgeon: Tonny Branch, MD;  Location: [facility];  Service: Ophthalmology;  Laterality: Left;  CDE:13.00  . CHOLECYSTECTOMY N/A 01/15/2015   Procedure: LAPAROSCOPIC CHOLECYSTECTOMY;  Surgeon: Ralene Ok,  MD;  Location: [facility];  Service: General;  Laterality: N/A;  . COLONOSCOPY N/A 09/29/2015   Procedure: COLONOSCOPY;  Surgeon: Rogene Houston, MD;  [facility];  Service: Endoscopy;  Laterality: N/A;  . COLONOSCOPY    . ESOPHAGOGASTRODUODENOSCOPY N/A 01/13/2015   Procedure: ESOPHAGOGASTRODUODENOSCOPY (EGD);  Surgeon: Jerene Bears, MD;  Location: [facility];  Service: Endoscopy;  Laterality: N/A;  . ESOPHAGOGASTRODUODENOSCOPY N/A 09/29/2015   Procedure: ESOPHAGOGASTRODUODENOSCOPY (EGD);  Surgeon: Rogene Houston, MD;  [facility];  Service: Endoscopy;  Laterality: N/A;  12:15  . GALLBLADDER SURGERY    . LEFT HEART CATH  AND CORONARY ANGIOGRAPHY N/A 06/22/2016   Procedure: Left Heart Cath and Coronary Angiography;  Surgeon: Troy Sine, MD;  Location: [facility];  Service: Cardiovascular;  Laterality: N/A;  . SHOULDER OPEN ROTATOR CUFF REPAIR     bilateral  . TOTAL KNEE ARTHROPLASTY  05/26/10   right  . TOTAL KNEE ARTHROPLASTY  11/17/2010   Procedure: TOTAL KNEE ARTHROPLASTY;  Surgeon: Mauri Pole;  Location: [facility];  Service: Orthopedics;  Laterality: Left;  . TOTAL KNEE ARTHROPLASTY     Right  . TOTAL SHOULDER REPLACEMENT  01/2010   left   Family History  Problem Relation Age of Onset  . COPD Brother   . Atrial fibrillation Brother   . Diabetes Brother   . Liver cancer Brother        Alcohol-related  . Colon cancer Brother   . Heart attack Father   . Early death Father   . Heart disease Father   . Rheum arthritis Mother   . Arthritis Mother   . Heart disease Mother   . Coronary artery disease Brother   . Heart attack Daughter   . Gallbladder disease Brother    Social History   Socioeconomic History  . Marital status: Widowed    Spouse name: Not on file  . Number of children: 1  . Years of education: Not on file  . Highest education level: Not on file  Occupational History  . Occupation: retired    Comment: self - hair-dresser  Social Needs  . Financial resource strain: Not on file  . Food insecurity:    Worry: Not on file    Inability: Not on file  . Transportation needs:    Medical: Not on file    Non-medical: Not on file  Tobacco Use  . Smoking status: Never Smoker  . Smokeless tobacco: Never Used  Substance and Sexual Activity  . Alcohol use: No  . Drug use: No  . Sexual activity: Never  Lifestyle  . Physical activity:    Days per week: Not on file    Minutes per session: Not on file  . Stress: Not on file  Relationships  . Social connections:    Talks on phone: Not on file    Gets together: Not on file    Attends religious service: Not on file     Active member of club or organization: Not on file    Attends meetings of clubs or organizations: Not on file    Relationship status: Not on file  Other Topics Concern  . Not on file  Social History Narrative  . Not on file    Outpatient Encounter Medications as of 02/17/2018  Medication Sig  . Acetaminophen (TYLENOL) 325 MG CAPS Take 650 mg by mouth every 6 (six) hours as needed for mild pain.  Marland Kitchen apixaban (ELIQUIS) 2.5  MG TABS tablet Take 1 tablet (2.5 mg total) by mouth 2 (two) times daily for 30 days.  . ARTIFICIAL TEAR OP Apply 2 drops to eye daily as needed (Dryness). To the left eye  . atorvastatin (LIPITOR) 20 MG tablet Take 1 tablet (20 mg total) by mouth at bedtime.  . digoxin (LANOXIN) 0.125 MG tablet Take 1 tablet (0.125 mg total) by mouth daily.  Marland Kitchen donepezil (ARICEPT) 5 MG tablet Take 1 tablet (5 mg total) by mouth at bedtime. FOR MEMORY  . flecainide (TAMBOCOR) 100 MG tablet 100 mg. Take 1/2 tablet 50 mg by mouth twice daily  . levETIRAcetam (KEPPRA) 500 MG tablet Take 1 tablet (500 mg total) by mouth 2 (two) times daily for 30 days.  Marland Kitchen levothyroxine (SYNTHROID, LEVOTHROID) 50 MCG tablet Take 1 tablet daily before breakfast  . metoprolol succinate (TOPROL-XL) 100 MG 24 hr tablet Take 1 tablet (100 mg total) by mouth daily. Take with or immediately following a meal.  . mirtazapine (REMERON) 15 MG tablet Take 1 tablet (15 mg total) by mouth at bedtime.  . pantoprazole (PROTONIX) 40 MG tablet Take 1 tablet (40 mg total) by mouth daily.  . traMADol (ULTRAM) 50 MG tablet Take 1 tablet (50 mg total) by mouth every 12 (twelve) hours as needed.  . hydrocortisone (ANUSOL-HC) 25 MG suppository Place 1 suppository (25 mg total) rectally 2 (two) times daily. (Patient not taking: Reported on 02/17/2018)  . magnesium hydroxide (MILK OF MAGNESIA) 400 MG/5ML suspension Take 15 mLs by mouth daily as needed for mild constipation. (Patient not taking: Reported on 02/17/2018)  . Peppermint Oil  (IBGARD PO) Take 1 tablet by mouth daily as needed (stomach pain).  . polyethylene glycol powder (GLYCOLAX/MIRALAX) powder Take 17 g by mouth daily as needed. (Patient not taking: Reported on 02/17/2018)  . [DISCONTINUED] docusate sodium (COLACE) 50 MG capsule Take 100 mg by mouth daily.   No facility-administered encounter medications on file as of 02/17/2018.     Activities of Daily Living In your present state of health, do you have any difficulty performing the following activities: 02/17/2018 01/30/2018  Hearing? N N  Vision? N N  Difficulty concentrating or making decisions? Tempie Donning  Walking or climbing stairs? Y Y  Dressing or bathing? Y Y  Doing errands, shopping? N N  Preparing Food and eating ? Y -  Using the Toilet? N -  In the past six months, have you accidently leaked urine? N -  Do you have problems with loss of bowel control? N -  Managing your Medications? Y -  Managing your Finances? Y -  Housekeeping or managing your Housekeeping? N -  Some recent data might be hidden    Patient Care Team: Dettinger, Fransisca Kaufmann, MD as PCP - General (Family Medicine) Minus Breeding, MD as PCP - Cardiology (Cardiology) Paralee Cancel, MD (Orthopedic Surgery) Deloria Lair, NP as [place] Management (Gerontology) Ilean China, RN as Case Manager Forrest, Norva Riffle, LCSW as Social Worker (Licensed Clinical Social Worker) Laural Golden, Mechele Dawley, MD as Consulting Physician (Gastroenterology)    Assessment:   This is a routine wellness examination for [name].  Exercise Activities and Dietary recommendations Current Exercise Habits: The patient does not participate in regular exercise at present, Exercise limited by: Other - see comments  Goals    . Exercise 150 minutes per week (moderate activity)    . Prevent falls     Get more active and start driving again  Fall Risk Fall Risk  02/17/2018 01/19/2018 01/06/2018 12/23/2017 12/06/2017  Falls in the past year? 0  0 0 0 1  Number falls in past yr: - - - - 1  Injury with Fall? - - - - 1  Comment - - - - -  Risk Factor Category  - - - - -  Risk for fall due to : - - - - -  Follow up - - - - -   Is the patient's home free of loose throw rugs in walkways, pet beds, electrical cords, etc?   Fall hazards and risks were discussed today    Depression Screen PHQ 2/9 Scores 02/17/2018 01/19/2018 01/06/2018 12/06/2017  PHQ - 2 Score 0 0 0 0  PHQ- 9 Score - - - -     Cognitive Function MMSE - Mini Mental State Exam 02/17/2018 01/18/2017 09/20/2016 12/16/2014  Orientation to time 4 2 4 4   Orientation to Place 5 5 5 5   Registration 3 3 3 3   Attention/ Calculation 5 5 5 3   Recall 2 0 0 3  Language- name 2 objects 2 2 2 2   Language- repeat 1 1 1 1   Language- follow 3 step command 3 3 3 3   Language- read & follow direction 1 1 1  0  Write a sentence 1 1 1 1   Copy design 1 1 0 0  Total score 28 24 25 25         Immunization History  Administered Date(s) Administered  . Influenza, High Dose Seasonal PF 10/06/2015, 10/15/2016, 10/11/2017  . Influenza-Unspecified 10/04/2014  . Pneumococcal Conjugate-13 02/20/2015  . Pneumococcal Polysaccharide-23 01/31/2006  . Tdap 07/16/2013    Qualifies for Shingles Vaccine?dclined   Screening Tests Health Maintenance  Topic Date Due  . DEXA SCAN  02/02/2019  . TETANUS/TDAP  07/17/2023  . INFLUENZA VACCINE  Completed  . PNA vac Low Risk Adult  Completed    Cancer Screenings: Lung: Low Dose CT Chest recommended if Age 4-80 years, 30 pack-year currently smoking OR have quit w/in 15years. Patient does not qualify. Breast:  Up to date on Mammogram? No   Up to date of Bone Density/Dexa? Yes Colorectal: due at next OV  Additional Screenings: declined  Hepatitis C Screening:      Plan:   pt is to keep follow up with Dr Warrick Parisian (hosp follow up scheduled today for 03/08/18. She will also keep follow up with DR Laural Golden and other specialist. She will work on  getting more active and she will try to not fall.   I have personally reviewed and noted the following in the patient's chart:   . Medical and social history . Use of alcohol, tobacco or illicit drugs  . Current medications and supplements . Functional ability and status . Nutritional status . Physical activity . Advanced directives . List of other physicians . Hospitalizations, surgeries, and ER visits in previous 12 months . Vitals . Screenings to include cognitive, depression, and falls . Referrals and appointments  In addition, I have reviewed and discussed with patient certain preventive protocols, quality metrics, and best practice recommendations. A written personalized care plan for preventive services as well as general preventive health recommendations were provided to patient.     [place]ley Dec, [place]  0/09/2328

## 2018-02-17 NOTE — Patient Instructions (Signed)
  [name] , Thank you for taking time to come for your Medicare Wellness Visit. I appreciate your ongoing commitment to your health goals. Please review the following plan we discussed and let me know if I can assist you in the future.   These are the goals we discussed: Goals    . Exercise 150 minutes per week (moderate activity)    . Prevent falls     Get more active and start driving again        This is a list of the screening recommended for you and due dates:  Health Maintenance  Topic Date Due  . DEXA scan (bone density measurement)  02/02/2019  . Tetanus Vaccine  07/17/2023  . Flu Shot  Completed  . Pneumonia vaccines  Completed   Keep follow up with Dr Warrick Parisian and other specialist   EUCERIN - OT cream / ointment for dry skin

## 2018-02-18 DIAGNOSIS — M810 Age-related osteoporosis without current pathological fracture: Secondary | ICD-10-CM | POA: Diagnosis not present

## 2018-02-18 DIAGNOSIS — I872 Venous insufficiency (chronic) (peripheral): Secondary | ICD-10-CM | POA: Diagnosis not present

## 2018-02-18 DIAGNOSIS — Z8673 Personal history of transient ischemic attack (TIA), and cerebral infarction without residual deficits: Secondary | ICD-10-CM | POA: Diagnosis not present

## 2018-02-18 DIAGNOSIS — Z950 Presence of cardiac pacemaker: Secondary | ICD-10-CM | POA: Diagnosis not present

## 2018-02-18 DIAGNOSIS — I5032 Chronic diastolic (congestive) heart failure: Secondary | ICD-10-CM | POA: Diagnosis not present

## 2018-02-18 DIAGNOSIS — G894 Chronic pain syndrome: Secondary | ICD-10-CM | POA: Diagnosis not present

## 2018-02-18 DIAGNOSIS — I2510 Atherosclerotic heart disease of native coronary artery without angina pectoris: Secondary | ICD-10-CM | POA: Diagnosis not present

## 2018-02-18 DIAGNOSIS — I480 Paroxysmal atrial fibrillation: Secondary | ICD-10-CM | POA: Diagnosis not present

## 2018-02-18 DIAGNOSIS — Z9181 History of falling: Secondary | ICD-10-CM | POA: Diagnosis not present

## 2018-02-18 DIAGNOSIS — I69320 Aphasia following cerebral infarction: Secondary | ICD-10-CM | POA: Diagnosis not present

## 2018-02-18 DIAGNOSIS — I110 Hypertensive heart disease with heart failure: Secondary | ICD-10-CM | POA: Diagnosis not present

## 2018-02-20 ENCOUNTER — Ambulatory Visit (INDEPENDENT_AMBULATORY_CARE_PROVIDER_SITE_OTHER): Payer: Medicare Other

## 2018-02-20 DIAGNOSIS — G894 Chronic pain syndrome: Secondary | ICD-10-CM | POA: Diagnosis not present

## 2018-02-20 DIAGNOSIS — I5032 Chronic diastolic (congestive) heart failure: Secondary | ICD-10-CM | POA: Diagnosis not present

## 2018-02-20 DIAGNOSIS — G40909 Epilepsy, unspecified, not intractable, without status epilepticus: Secondary | ICD-10-CM

## 2018-02-20 DIAGNOSIS — F0390 Unspecified dementia without behavioral disturbance: Secondary | ICD-10-CM

## 2018-02-20 DIAGNOSIS — Z9181 History of falling: Secondary | ICD-10-CM | POA: Diagnosis not present

## 2018-02-20 DIAGNOSIS — Z8673 Personal history of transient ischemic attack (TIA), and cerebral infarction without residual deficits: Secondary | ICD-10-CM | POA: Diagnosis not present

## 2018-02-20 DIAGNOSIS — I110 Hypertensive heart disease with heart failure: Secondary | ICD-10-CM | POA: Diagnosis not present

## 2018-02-20 DIAGNOSIS — I69320 Aphasia following cerebral infarction: Secondary | ICD-10-CM

## 2018-02-20 DIAGNOSIS — I480 Paroxysmal atrial fibrillation: Secondary | ICD-10-CM | POA: Diagnosis not present

## 2018-02-20 DIAGNOSIS — M810 Age-related osteoporosis without current pathological fracture: Secondary | ICD-10-CM

## 2018-02-20 DIAGNOSIS — Z950 Presence of cardiac pacemaker: Secondary | ICD-10-CM | POA: Diagnosis not present

## 2018-02-20 DIAGNOSIS — I2510 Atherosclerotic heart disease of native coronary artery without angina pectoris: Secondary | ICD-10-CM | POA: Diagnosis not present

## 2018-02-20 DIAGNOSIS — F339 Major depressive disorder, recurrent, unspecified: Secondary | ICD-10-CM

## 2018-02-20 DIAGNOSIS — I872 Venous insufficiency (chronic) (peripheral): Secondary | ICD-10-CM

## 2018-02-21 ENCOUNTER — Other Ambulatory Visit: Payer: Self-pay | Admitting: *Deleted

## 2018-02-21 DIAGNOSIS — I480 Paroxysmal atrial fibrillation: Secondary | ICD-10-CM | POA: Diagnosis not present

## 2018-02-21 DIAGNOSIS — M810 Age-related osteoporosis without current pathological fracture: Secondary | ICD-10-CM | POA: Diagnosis not present

## 2018-02-21 DIAGNOSIS — Z950 Presence of cardiac pacemaker: Secondary | ICD-10-CM | POA: Diagnosis not present

## 2018-02-21 DIAGNOSIS — I5032 Chronic diastolic (congestive) heart failure: Secondary | ICD-10-CM | POA: Diagnosis not present

## 2018-02-21 DIAGNOSIS — I2510 Atherosclerotic heart disease of native coronary artery without angina pectoris: Secondary | ICD-10-CM | POA: Diagnosis not present

## 2018-02-21 DIAGNOSIS — I110 Hypertensive heart disease with heart failure: Secondary | ICD-10-CM | POA: Diagnosis not present

## 2018-02-21 DIAGNOSIS — I69320 Aphasia following cerebral infarction: Secondary | ICD-10-CM | POA: Diagnosis not present

## 2018-02-21 DIAGNOSIS — Z8673 Personal history of transient ischemic attack (TIA), and cerebral infarction without residual deficits: Secondary | ICD-10-CM | POA: Diagnosis not present

## 2018-02-21 DIAGNOSIS — I872 Venous insufficiency (chronic) (peripheral): Secondary | ICD-10-CM | POA: Diagnosis not present

## 2018-02-21 DIAGNOSIS — Z9181 History of falling: Secondary | ICD-10-CM | POA: Diagnosis not present

## 2018-02-21 DIAGNOSIS — G894 Chronic pain syndrome: Secondary | ICD-10-CM | POA: Diagnosis not present

## 2018-02-22 DIAGNOSIS — I872 Venous insufficiency (chronic) (peripheral): Secondary | ICD-10-CM | POA: Diagnosis not present

## 2018-02-22 DIAGNOSIS — Z950 Presence of cardiac pacemaker: Secondary | ICD-10-CM | POA: Diagnosis not present

## 2018-02-22 DIAGNOSIS — I2510 Atherosclerotic heart disease of native coronary artery without angina pectoris: Secondary | ICD-10-CM | POA: Diagnosis not present

## 2018-02-22 DIAGNOSIS — I480 Paroxysmal atrial fibrillation: Secondary | ICD-10-CM | POA: Diagnosis not present

## 2018-02-22 DIAGNOSIS — Z8673 Personal history of transient ischemic attack (TIA), and cerebral infarction without residual deficits: Secondary | ICD-10-CM | POA: Diagnosis not present

## 2018-02-22 DIAGNOSIS — G894 Chronic pain syndrome: Secondary | ICD-10-CM | POA: Diagnosis not present

## 2018-02-22 DIAGNOSIS — Z9181 History of falling: Secondary | ICD-10-CM | POA: Diagnosis not present

## 2018-02-22 DIAGNOSIS — I69320 Aphasia following cerebral infarction: Secondary | ICD-10-CM | POA: Diagnosis not present

## 2018-02-22 DIAGNOSIS — I5032 Chronic diastolic (congestive) heart failure: Secondary | ICD-10-CM | POA: Diagnosis not present

## 2018-02-22 DIAGNOSIS — I110 Hypertensive heart disease with heart failure: Secondary | ICD-10-CM | POA: Diagnosis not present

## 2018-02-22 DIAGNOSIS — M810 Age-related osteoporosis without current pathological fracture: Secondary | ICD-10-CM | POA: Diagnosis not present

## 2018-02-22 MED ORDER — APIXABAN 2.5 MG PO TABS: 2.5000 mg | ORAL_TABLET | Freq: Two times a day (BID) | ORAL | 0 refills | Status: DC

## 2018-02-22 MED ORDER — LEVETIRACETAM 500 MG PO TABS: 500.0000 mg | ORAL_TABLET | Freq: Two times a day (BID) | ORAL | 0 refills | Status: DC

## 2018-02-23 DIAGNOSIS — I2510 Atherosclerotic heart disease of native coronary artery without angina pectoris: Secondary | ICD-10-CM | POA: Diagnosis not present

## 2018-02-23 DIAGNOSIS — Z9181 History of falling: Secondary | ICD-10-CM | POA: Diagnosis not present

## 2018-02-23 DIAGNOSIS — I872 Venous insufficiency (chronic) (peripheral): Secondary | ICD-10-CM | POA: Diagnosis not present

## 2018-02-23 DIAGNOSIS — I480 Paroxysmal atrial fibrillation: Secondary | ICD-10-CM | POA: Diagnosis not present

## 2018-02-23 DIAGNOSIS — I69320 Aphasia following cerebral infarction: Secondary | ICD-10-CM | POA: Diagnosis not present

## 2018-02-23 DIAGNOSIS — I5032 Chronic diastolic (congestive) heart failure: Secondary | ICD-10-CM | POA: Diagnosis not present

## 2018-02-23 DIAGNOSIS — Z8673 Personal history of transient ischemic attack (TIA), and cerebral infarction without residual deficits: Secondary | ICD-10-CM | POA: Diagnosis not present

## 2018-02-23 DIAGNOSIS — M810 Age-related osteoporosis without current pathological fracture: Secondary | ICD-10-CM | POA: Diagnosis not present

## 2018-02-23 DIAGNOSIS — Z950 Presence of cardiac pacemaker: Secondary | ICD-10-CM | POA: Diagnosis not present

## 2018-02-23 DIAGNOSIS — I110 Hypertensive heart disease with heart failure: Secondary | ICD-10-CM | POA: Diagnosis not present

## 2018-02-23 DIAGNOSIS — G894 Chronic pain syndrome: Secondary | ICD-10-CM | POA: Diagnosis not present

## 2018-02-24 DIAGNOSIS — Z8673 Personal history of transient ischemic attack (TIA), and cerebral infarction without residual deficits: Secondary | ICD-10-CM | POA: Diagnosis not present

## 2018-02-24 DIAGNOSIS — I480 Paroxysmal atrial fibrillation: Secondary | ICD-10-CM | POA: Diagnosis not present

## 2018-02-24 DIAGNOSIS — Z950 Presence of cardiac pacemaker: Secondary | ICD-10-CM | POA: Diagnosis not present

## 2018-02-24 DIAGNOSIS — M810 Age-related osteoporosis without current pathological fracture: Secondary | ICD-10-CM | POA: Diagnosis not present

## 2018-02-24 DIAGNOSIS — G894 Chronic pain syndrome: Secondary | ICD-10-CM | POA: Diagnosis not present

## 2018-02-24 DIAGNOSIS — I872 Venous insufficiency (chronic) (peripheral): Secondary | ICD-10-CM | POA: Diagnosis not present

## 2018-02-24 DIAGNOSIS — I2510 Atherosclerotic heart disease of native coronary artery without angina pectoris: Secondary | ICD-10-CM | POA: Diagnosis not present

## 2018-02-24 DIAGNOSIS — I69320 Aphasia following cerebral infarction: Secondary | ICD-10-CM | POA: Diagnosis not present

## 2018-02-24 DIAGNOSIS — I5032 Chronic diastolic (congestive) heart failure: Secondary | ICD-10-CM | POA: Diagnosis not present

## 2018-02-24 DIAGNOSIS — I110 Hypertensive heart disease with heart failure: Secondary | ICD-10-CM | POA: Diagnosis not present

## 2018-02-24 DIAGNOSIS — Z9181 History of falling: Secondary | ICD-10-CM | POA: Diagnosis not present

## 2018-02-27 DIAGNOSIS — I2510 Atherosclerotic heart disease of native coronary artery without angina pectoris: Secondary | ICD-10-CM | POA: Diagnosis not present

## 2018-02-27 DIAGNOSIS — Z9181 History of falling: Secondary | ICD-10-CM | POA: Diagnosis not present

## 2018-02-27 DIAGNOSIS — M810 Age-related osteoporosis without current pathological fracture: Secondary | ICD-10-CM | POA: Diagnosis not present

## 2018-02-27 DIAGNOSIS — Z8673 Personal history of transient ischemic attack (TIA), and cerebral infarction without residual deficits: Secondary | ICD-10-CM | POA: Diagnosis not present

## 2018-02-27 DIAGNOSIS — I872 Venous insufficiency (chronic) (peripheral): Secondary | ICD-10-CM | POA: Diagnosis not present

## 2018-02-27 DIAGNOSIS — I5032 Chronic diastolic (congestive) heart failure: Secondary | ICD-10-CM | POA: Diagnosis not present

## 2018-02-27 DIAGNOSIS — G894 Chronic pain syndrome: Secondary | ICD-10-CM | POA: Diagnosis not present

## 2018-02-27 DIAGNOSIS — I480 Paroxysmal atrial fibrillation: Secondary | ICD-10-CM | POA: Diagnosis not present

## 2018-02-27 DIAGNOSIS — I69320 Aphasia following cerebral infarction: Secondary | ICD-10-CM | POA: Diagnosis not present

## 2018-02-27 DIAGNOSIS — I110 Hypertensive heart disease with heart failure: Secondary | ICD-10-CM | POA: Diagnosis not present

## 2018-02-27 DIAGNOSIS — Z950 Presence of cardiac pacemaker: Secondary | ICD-10-CM | POA: Diagnosis not present

## 2018-02-28 ENCOUNTER — Ambulatory Visit (INDEPENDENT_AMBULATORY_CARE_PROVIDER_SITE_OTHER): Payer: Medicare Other | Admitting: Internal Medicine

## 2018-02-28 ENCOUNTER — Other Ambulatory Visit: Payer: Self-pay | Admitting: *Deleted

## 2018-03-01 ENCOUNTER — Observation Stay (HOSPITAL_COMMUNITY): Payer: Medicare Other

## 2018-03-01 ENCOUNTER — Other Ambulatory Visit: Payer: Self-pay

## 2018-03-01 ENCOUNTER — Emergency Department (HOSPITAL_COMMUNITY): Payer: Medicare Other

## 2018-03-01 ENCOUNTER — Inpatient Hospital Stay (HOSPITAL_COMMUNITY)
Admission: EM | Admit: 2018-03-01 | Discharge: 2018-03-07 | DRG: 101 | Disposition: A | Discharge status: Skilled Nursing Facility | Payer: Medicare Other | Attending: Internal Medicine | Admitting: Internal Medicine

## 2018-03-01 ENCOUNTER — Encounter (HOSPITAL_COMMUNITY): Payer: Self-pay | Admitting: Emergency Medicine

## 2018-03-01 DIAGNOSIS — R531 Weakness: Secondary | ICD-10-CM

## 2018-03-01 DIAGNOSIS — R000 Tachycardia, unspecified: Secondary | ICD-10-CM | POA: Diagnosis not present

## 2018-03-01 DIAGNOSIS — I4819 Other persistent atrial fibrillation: Secondary | ICD-10-CM | POA: Diagnosis present

## 2018-03-01 DIAGNOSIS — R29818 Other symptoms and signs involving the nervous system: Secondary | ICD-10-CM | POA: Diagnosis not present

## 2018-03-01 DIAGNOSIS — M810 Age-related osteoporosis without current pathological fracture: Secondary | ICD-10-CM | POA: Diagnosis not present

## 2018-03-01 DIAGNOSIS — R0902 Hypoxemia: Secondary | ICD-10-CM | POA: Diagnosis not present

## 2018-03-01 DIAGNOSIS — G894 Chronic pain syndrome: Secondary | ICD-10-CM | POA: Diagnosis not present

## 2018-03-01 DIAGNOSIS — R059 Cough, unspecified: Secondary | ICD-10-CM

## 2018-03-01 DIAGNOSIS — R0689 Other abnormalities of breathing: Secondary | ICD-10-CM | POA: Diagnosis not present

## 2018-03-01 DIAGNOSIS — R29716 NIHSS score 16: Secondary | ICD-10-CM | POA: Diagnosis present

## 2018-03-01 DIAGNOSIS — G9341 Metabolic encephalopathy: Secondary | ICD-10-CM | POA: Diagnosis not present

## 2018-03-01 DIAGNOSIS — I10 Essential (primary) hypertension: Secondary | ICD-10-CM | POA: Diagnosis not present

## 2018-03-01 DIAGNOSIS — E039 Hypothyroidism, unspecified: Secondary | ICD-10-CM | POA: Diagnosis present

## 2018-03-01 DIAGNOSIS — K219 Gastro-esophageal reflux disease without esophagitis: Secondary | ICD-10-CM | POA: Diagnosis present

## 2018-03-01 DIAGNOSIS — I482 Chronic atrial fibrillation, unspecified: Secondary | ICD-10-CM | POA: Diagnosis not present

## 2018-03-01 DIAGNOSIS — Z79899 Other long term (current) drug therapy: Secondary | ICD-10-CM

## 2018-03-01 DIAGNOSIS — R4701 Aphasia: Secondary | ICD-10-CM | POA: Diagnosis not present

## 2018-03-01 DIAGNOSIS — N390 Urinary tract infection, site not specified: Secondary | ICD-10-CM | POA: Diagnosis present

## 2018-03-01 DIAGNOSIS — Z96653 Presence of artificial knee joint, bilateral: Secondary | ICD-10-CM | POA: Diagnosis present

## 2018-03-01 DIAGNOSIS — Z79890 Hormone replacement therapy (postmenopausal): Secondary | ICD-10-CM

## 2018-03-01 DIAGNOSIS — I4891 Unspecified atrial fibrillation: Secondary | ICD-10-CM | POA: Diagnosis not present

## 2018-03-01 DIAGNOSIS — F0390 Unspecified dementia without behavioral disturbance: Secondary | ICD-10-CM | POA: Diagnosis present

## 2018-03-01 DIAGNOSIS — R569 Unspecified convulsions: Secondary | ICD-10-CM | POA: Diagnosis not present

## 2018-03-01 DIAGNOSIS — I872 Venous insufficiency (chronic) (peripheral): Secondary | ICD-10-CM | POA: Diagnosis not present

## 2018-03-01 DIAGNOSIS — I480 Paroxysmal atrial fibrillation: Secondary | ICD-10-CM | POA: Diagnosis present

## 2018-03-01 DIAGNOSIS — I5032 Chronic diastolic (congestive) heart failure: Secondary | ICD-10-CM | POA: Diagnosis present

## 2018-03-01 DIAGNOSIS — E871 Hypo-osmolality and hyponatremia: Secondary | ICD-10-CM | POA: Diagnosis not present

## 2018-03-01 DIAGNOSIS — G9340 Encephalopathy, unspecified: Secondary | ICD-10-CM | POA: Diagnosis not present

## 2018-03-01 DIAGNOSIS — G40409 Other generalized epilepsy and epileptic syndromes, not intractable, without status epilepticus: Principal | ICD-10-CM | POA: Diagnosis present

## 2018-03-01 DIAGNOSIS — E876 Hypokalemia: Secondary | ICD-10-CM | POA: Diagnosis not present

## 2018-03-01 DIAGNOSIS — I110 Hypertensive heart disease with heart failure: Secondary | ICD-10-CM | POA: Diagnosis not present

## 2018-03-01 DIAGNOSIS — Z9049 Acquired absence of other specified parts of digestive tract: Secondary | ICD-10-CM | POA: Diagnosis not present

## 2018-03-01 DIAGNOSIS — Z950 Presence of cardiac pacemaker: Secondary | ICD-10-CM | POA: Diagnosis not present

## 2018-03-01 DIAGNOSIS — I69320 Aphasia following cerebral infarction: Secondary | ICD-10-CM | POA: Diagnosis not present

## 2018-03-01 DIAGNOSIS — Z7901 Long term (current) use of anticoagulants: Secondary | ICD-10-CM

## 2018-03-01 DIAGNOSIS — R509 Fever, unspecified: Secondary | ICD-10-CM | POA: Diagnosis not present

## 2018-03-01 DIAGNOSIS — I6523 Occlusion and stenosis of bilateral carotid arteries: Secondary | ICD-10-CM | POA: Diagnosis not present

## 2018-03-01 DIAGNOSIS — Z8673 Personal history of transient ischemic attack (TIA), and cerebral infarction without residual deficits: Secondary | ICD-10-CM | POA: Diagnosis not present

## 2018-03-01 DIAGNOSIS — E785 Hyperlipidemia, unspecified: Secondary | ICD-10-CM | POA: Diagnosis present

## 2018-03-01 DIAGNOSIS — R05 Cough: Secondary | ICD-10-CM

## 2018-03-01 DIAGNOSIS — I2510 Atherosclerotic heart disease of native coronary artery without angina pectoris: Secondary | ICD-10-CM | POA: Diagnosis not present

## 2018-03-01 DIAGNOSIS — R29810 Facial weakness: Secondary | ICD-10-CM | POA: Diagnosis not present

## 2018-03-01 DIAGNOSIS — Z9181 History of falling: Secondary | ICD-10-CM | POA: Diagnosis not present

## 2018-03-01 LAB — RAPID URINE DRUG SCREEN, HOSP PERFORMED
Amphetamines: NOT DETECTED
Barbiturates: NOT DETECTED
Benzodiazepines: NOT DETECTED
Cocaine: NOT DETECTED
Opiates: NOT DETECTED
Tetrahydrocannabinol: NOT DETECTED

## 2018-03-01 LAB — CBC
HCT: 40.5 % (ref 36.0–46.0)
Hemoglobin: 12.8 g/dL (ref 12.0–15.0)
MCH: 29.6 pg (ref 26.0–34.0)
MCHC: 31.6 g/dL (ref 30.0–36.0)
MCV: 93.5 fL (ref 80.0–100.0)
Platelets: 271 10*3/uL (ref 150–400)
RBC: 4.33 MIL/uL (ref 3.87–5.11)
RDW: 15.5 % (ref 11.5–15.5)
WBC: 7.8 10*3/uL (ref 4.0–10.5)
nRBC: 0 % (ref 0.0–0.2)

## 2018-03-01 LAB — URINALYSIS, ROUTINE W REFLEX MICROSCOPIC
BACTERIA UA: NONE SEEN
Bilirubin Urine: NEGATIVE
Glucose, UA: NEGATIVE mg/dL
Hgb urine dipstick: NEGATIVE
Ketones, ur: NEGATIVE mg/dL
Leukocytes,Ua: NEGATIVE
Nitrite: NEGATIVE
Protein, ur: NEGATIVE mg/dL
Specific Gravity, Urine: 1.026 (ref 1.005–1.030)
pH: 8 (ref 5.0–8.0)

## 2018-03-01 LAB — DIFFERENTIAL
Abs Immature Granulocytes: 0.03 10*3/uL (ref 0.00–0.07)
Basophils Absolute: 0 10*3/uL (ref 0.0–0.1)
Basophils Relative: 1 %
Eosinophils Absolute: 0.1 10*3/uL (ref 0.0–0.5)
Eosinophils Relative: 1 %
Immature Granulocytes: 0 %
LYMPHS ABS: 1.8 10*3/uL (ref 0.7–4.0)
Lymphocytes Relative: 23 %
Monocytes Absolute: 0.9 10*3/uL (ref 0.1–1.0)
Monocytes Relative: 12 %
Neutro Abs: 4.9 10*3/uL (ref 1.7–7.7)
Neutrophils Relative %: 63 %

## 2018-03-01 LAB — COMPREHENSIVE METABOLIC PANEL
ALT: 15 U/L (ref 0–44)
AST: 22 U/L (ref 15–41)
Albumin: 3.7 g/dL (ref 3.5–5.0)
Alkaline Phosphatase: 64 U/L (ref 38–126)
Anion gap: 9 (ref 5–15)
BILIRUBIN TOTAL: 0.5 mg/dL (ref 0.3–1.2)
BUN: 17 mg/dL (ref 8–23)
CO2: 25 mmol/L (ref 22–32)
Calcium: 8.6 mg/dL — ABNORMAL LOW (ref 8.9–10.3)
Chloride: 99 mmol/L (ref 98–111)
Creatinine, Ser: 0.54 mg/dL (ref 0.44–1.00)
GFR calc Af Amer: >60 mL/min (ref >60)
Glucose, Bld: 161 mg/dL — ABNORMAL HIGH (ref 70–99)
Potassium: 3.8 mmol/L (ref 3.5–5.1)
Sodium: 133 mmol/L — ABNORMAL LOW (ref 135–145)
TOTAL PROTEIN: 6.7 g/dL (ref 6.5–8.1)

## 2018-03-01 LAB — PROTIME-INR
INR: 1.1 (ref 0.8–1.2)
Prothrombin Time: 13.7 seconds (ref 11.4–15.2)

## 2018-03-01 LAB — APTT: aPTT: 35 seconds (ref 24–36)

## 2018-03-01 LAB — DIGOXIN LEVEL: Digoxin Level: 0.9 ng/mL (ref 0.8–2.0)

## 2018-03-01 LAB — INFLUENZA PANEL BY PCR (TYPE A & B)
Influenza A By PCR: NEGATIVE
Influenza B By PCR: NEGATIVE

## 2018-03-01 LAB — CBG MONITORING, ED: Glucose-Capillary: 167 mg/dL — ABNORMAL HIGH (ref 70–99)

## 2018-03-01 LAB — LACTIC ACID, PLASMA: Lactic Acid, Venous: 2 mmol/L (ref 0.5–1.9)

## 2018-03-01 LAB — ETHANOL: Alcohol, Ethyl (B): <10 mg/dL (ref <10)

## 2018-03-01 MED ORDER — LEVETIRACETAM IN NACL 1000 MG/100ML IV SOLN
1000.0000 mg | Freq: Once | INTRAVENOUS | Status: AC
  Administered 2018-03-01: 1000 mg via INTRAVENOUS
  Filled 2018-03-01: qty 100

## 2018-03-01 MED ORDER — FLECAINIDE ACETATE 100 MG PO TABS: 50.0000 mg | ORAL_TABLET | Freq: Two times a day (BID) | ORAL | 0 refills | Status: DC

## 2018-03-01 MED ORDER — ORAL CARE MOUTH RINSE
15.0000 mL | Freq: Two times a day (BID) | OROMUCOSAL | Status: DC
  Administered 2018-03-03 – 2018-03-07 (×10): 15 mL via OROMUCOSAL

## 2018-03-01 MED ORDER — ONDANSETRON HCL 4 MG/2ML IJ SOLN: 4.0000 mg | Freq: Four times a day (QID) | INTRAMUSCULAR | Status: DC | PRN

## 2018-03-01 MED ORDER — POTASSIUM CHLORIDE IN NACL 20-0.9 MEQ/L-% IV SOLN
INTRAVENOUS | Status: AC
  Administered 2018-03-01: via INTRAVENOUS

## 2018-03-01 MED ORDER — METOPROLOL TARTRATE 5 MG/5ML IV SOLN
5.0000 mg | INTRAVENOUS | Status: DC | PRN
  Administered 2018-03-02: 5 mg via INTRAVENOUS
  Filled 2018-03-01: qty 5

## 2018-03-01 MED ORDER — LEVETIRACETAM IN NACL 500 MG/100ML IV SOLN
500.0000 mg | Freq: Two times a day (BID) | INTRAVENOUS | Status: DC
  Administered 2018-03-02 (×2): 500 mg via INTRAVENOUS
  Filled 2018-03-01: qty 100

## 2018-03-01 MED ORDER — ONDANSETRON HCL 4 MG/2ML IJ SOLN
INTRAMUSCULAR | Status: AC
  Administered 2018-03-01: 4 mg
  Filled 2018-03-01: qty 2

## 2018-03-01 MED ORDER — IOPAMIDOL (ISOVUE-370) INJECTION 76%
150.0000 mL | Freq: Once | INTRAVENOUS | Status: AC | PRN
  Administered 2018-03-01: 115 mL via INTRAVENOUS

## 2018-03-01 MED ORDER — SODIUM CHLORIDE 0.9 % IV SOLN
750.0000 mg | Freq: Two times a day (BID) | INTRAVENOUS | Status: DC
  Filled 2018-03-01: qty 7.5

## 2018-03-01 MED ORDER — VANCOMYCIN HCL 500 MG IV SOLR
INTRAVENOUS | Status: AC
  Filled 2018-03-01: qty 500

## 2018-03-01 MED ORDER — ONDANSETRON HCL 4 MG PO TABS
4.0000 mg | ORAL_TABLET | Freq: Three times a day (TID) | ORAL | Status: DC | PRN
  Administered 2018-03-05: 4 mg via ORAL
  Filled 2018-03-01: qty 1

## 2018-03-01 MED ORDER — ONDANSETRON HCL 4 MG PO TABS: 4.0000 mg | ORAL_TABLET | Freq: Four times a day (QID) | ORAL | Status: DC | PRN

## 2018-03-01 MED ORDER — SODIUM CHLORIDE 0.9 % IV BOLUS
500.0000 mL | Freq: Once | INTRAVENOUS | Status: AC
  Administered 2018-03-01: 500 mL via INTRAVENOUS

## 2018-03-01 MED ORDER — ACETAMINOPHEN 650 MG RE SUPP
650.0000 mg | RECTAL | Status: DC | PRN
  Administered 2018-03-01 – 2018-03-05 (×3): 650 mg via RECTAL
  Filled 2018-03-01 (×5): qty 1

## 2018-03-01 MED ORDER — SODIUM CHLORIDE 0.9 % IV SOLN
2.0000 g | Freq: Once | INTRAVENOUS | Status: AC
  Administered 2018-03-02: 2 g via INTRAVENOUS
  Filled 2018-03-01 (×2): qty 2

## 2018-03-01 MED ORDER — ONDANSETRON HCL 4 MG PO TABS: 4.0000 mg | ORAL_TABLET | Freq: Three times a day (TID) | ORAL | Status: DC | PRN

## 2018-03-01 MED ORDER — LEVETIRACETAM 500 MG/5ML IV SOLN
INTRAVENOUS | Status: AC
  Filled 2018-03-01: qty 10

## 2018-03-01 MED ORDER — VANCOMYCIN HCL 500 MG IV SOLR
500.0000 mg | Freq: Once | INTRAVENOUS | Status: AC
  Administered 2018-03-02: 500 mg via INTRAVENOUS
  Filled 2018-03-01: qty 500

## 2018-03-01 MED ORDER — LORAZEPAM 2 MG/ML IJ SOLN
INTRAMUSCULAR | Status: AC
  Administered 2018-03-01: 1 mg
  Filled 2018-03-01: qty 1

## 2018-03-01 NOTE — ED Notes (Addendum)
Pt difficult stick. Dr Dayna Barker able to place [place] IV #18 in left ac

## 2018-03-01 NOTE — ED Notes (Signed)
Called into patient's room by family stating patient vomited. Patient had emesis on the front of her gown and sheets. Sat patient up and suctioned patient. Able to maintain airway at this time. Dr Dayna Barker advised and order for zofran given. Cleaned patient and changed bed sheets. Dr Dayna Barker at bedside to speak with family. Placed purewick on patient and able to retrieve urine sample.

## 2018-03-01 NOTE — ED Notes (Signed)
Pt very fidgety and will not lay still in CT. Dr Dayna Barker ordered 1 mg ativan IV

## 2018-03-01 NOTE — ED Triage Notes (Signed)
Patient brought in by EMS with complaint of "gait issues" at 1030 today. States they thought if she rested it would get better but started having left sided weakness at 1310.

## 2018-03-01 NOTE — ED Notes (Signed)
Pt transported back to CT 

## 2018-03-01 NOTE — Consult Note (Signed)
[facility] Tele[facility]urology Consult Services   Date of Service:   03/01/2018 13:40:02  Impression:     .  Large Vessel Infarct     .  Right Hemispheric Infarct     .  MCA Distribution Infarct  Comments: Patient presented with RMCA syndrome. LKN was 1030. Patient was not a tPA candidate because she is on eliquis. CT head have a good ASPECT score, due to high suspicions of LVO CTA Head/Neck and cTP was ordered  Mechanism of Stroke: Possible Cardioembolic  Metrics: Last Known Well: 03/01/2018 10:30:00 [facility] Notification Time: 03/01/2018 13:40:02 Arrival Time: 03/01/2018 13:49:00 Stamp Time: 03/01/2018 13:40:02 Time First Login Attempt: 03/01/2018 13:45:00 Video Start Time: 03/01/2018 13:45:00  Symptoms: Left sided weakness. NIHSS Start Assessment Time: 03/01/2018 13:57:00 Patient is not a candidate for tPA. Patient was not deemed candidate for tPA thrombolytics because of Current use of NOA's. Video End Time: 03/01/2018 14:09:05  CT head showed no acute hemorrhage or acute core infarct.  Clinical Presentation is Suggestive of Large Vessel Occlusive Disease, Recommendations are as Follows  CTA Head and Neck. CT Perfusion.   ED Physician notified of diagnostic impression and management plan on 03/01/2018 14:09:23  Our recommendations are outlined below.  Recommendations:     .  Activate Stroke Protocol Admission/Order Set     .  Stroke/Telemetry Floor     .  Neuro Checks     .  Bedside Swallow Eval     .  DVT Prophylaxis     .  IV Fluids, Normal Saline     .  Head of Bed Below 30 Degrees     .  Euglycemia and Avoid Hyperthermia (PRN Acetaminophen)     .  Antiplatelet Therapy Recommended  Routine Consultation with [facility] for Follow up Care  Sign Out:     .  Discussed with Emergency Department Provider    ------------------------------------------------------------------------------  History of Present Illness: Patient is a 85 year  old Female.  Patient was brought by EMS for symptoms of Left sided weakness.  PMH of atrial fibrillation, Epilepsy presents with left sided weakness. LKN was 1030 this morning whens started with gait disturbance then at 1310 she was weak on  CT head showed no acute hemorrhage or acute core infarct.  Last seen normal was within 4.5 hours. There is no history of hemorrhagic complications or intracranial hemorrhage. There is history of Recent Anticoagulants. There is no history of recent major surgery. There is no history of recent stroke.  Examination: 1A: Level of Consciousness - Alert; keenly responsive + 0 1B: Ask Month and Age - Could Not Answer Either Question Correctly + 2 1C: Blink Eyes & Squeeze Hands - Performs Both Tasks + 0 2: Test Horizontal Extraocular Movements - Forced Gaze Palsy: Cannot Be Overcome + 2 3: Test Visual Fields - Complete Hemianopia + 2 4: Test Facial Palsy (Use Grimace if Obtunded) - Partial paralysis (lower face) + 2 5A: Test Left Arm Motor Drift - Drift, hits bed + 2 5B: Test Right Arm Motor Drift - No Drift for 10 Seconds + 0 6A: Test Left Leg Motor Drift - Drift, hits bed + 2 6B: Test Right Leg Motor Drift - No Drift for 5 Seconds + 0 7: Test Limb Ataxia (FNF/Heel-Shin) - No Ataxia + 0 8: Test Sensation - Mild-Moderate Loss: Less Sharp/More Dull + 1 9: Test Language/Aphasia - Mild-Moderate Aphasia: Some Obvious Changes, Without Significant Limitation + 1 10: Test Dysarthria - Mild-Moderate Dysarthria: Slurring but  can be understood + 1 11: Test Extinction/Inattention - Extinction to bilateral simultaneous stimulation + 1  NIHSS Score: 16  Patient was informed the Neurology Consult would happen via TeleHealth consult by way of interactive audio and video telecommunications and consented to receiving care in this manner.  Due to the immediate potential for life-threatening deterioration due to underlying acute neurologic illness, I spent 35 minutes  providing critical care. This time includes time for face to face visit via telemedicine, review of medical records, imaging studies and discussion of findings with providers, the patient and/or family.   Dr Hinda Lenis Almer Bushey   [facility] [phone]  Case [id number]

## 2018-03-01 NOTE — H&P (Addendum)
History and Physical    [name] TFT:[id number] DOB: 1932-01-15 DOA: 03/01/2018  PCP: Dettinger, Fransisca Kaufmann, MD   Patient coming from: Home  I have personally briefly reviewed patient's old medical records in [place]  Chief Complaint: Left sided weakness, confusion, seizure activity  HPI: [name] is a 85 y.o. female with medical history significant for paroxysmal A. Fib/atrial flutter, diastolic CHF, HTN, CVA, dementia, who presented to the ED after reports of initial confusion earlier today, patient's aide was with her, and reports that it appeared patient did not know how to use her walker, kept walking into the walls with a walker, her speech did not make sense also, this began at about 1030 today.  It was also noted that she was not moving her left side-about 1:30 PM.  Patient was also still confused.  Patients niece reports significant jecking of all patient's extremities, she is adamant and sure it was not tremors, reports her husband has Parkinson's and she knows the difference. Patient return of my evaluation, s/p Ativan 0.5mg , given for jerking/tremors so CT had could be done, she is lethargic appears to be sleeping and not responding to questions.  She is talking in a low voice, but her speech is not comprehensible. Family denies any vomiting or loose stools, no complaints prior to today, no difficulty breathing, at the time of my evaluation she is coughing, appears to be new.  Recent hospitalization 1/27- 1/31-for aphasia/acute encephalopathy secondary to TIA versus seizure.  Including MRI, EEG were unremarkable.  Neurology recommended increasing patient's Keppra dose to 500 twice daily.  ED Course: Initial vitals temperature 98.8, tachycardic to 706C, systolic 376E to 831, O2 sats initially on room air 93%, down to 85% on room air improved with nasal cannula.  Head CT, cerebral perfusion, CT angios head and neck negative, motion degraded but negative for acute  abnormality. WBC 7.8.  Normal digoxin level.  Negative UDS.  Sodium 133, otherwise unremarkable.  After ativan in ED patient vomited.  EDP talked to neurologist on call, patient with similar presentation on recent hospitalization.  Hospitalist to admit for CVA work-up vs seizures.  Review of Systems: Unable To ascertain due to mental status.  Past Medical History:  Diagnosis Date  . Anemia   . Atrial fibrillation (HCC)    Paroxysmal, Flecainide therapy  . Calf pain    September, 2012, at rest  . Carotid bruit    Doppler, December, 2009, no abnormality  . Cataract   . Diverticulosis   . GERD (gastroesophageal reflux disease)   . History of shingles   . HLD (hyperlipidemia)   . HTN (hypertension)   . Hypothyroidism   . Insomnia   . Osteoarthritis   . PONV (postoperative nausea and vomiting)   . Rectal bleeding 2001   diverticulosis and int hemorrhoids on 07/1999 and 02/2010 colonoscopies.  . Seizures ([place])   . Stroke ([place])   . Warfarin anticoagulation     Past Surgical History:  Procedure Laterality Date  . BIOPSY  09/29/2015   Procedure: BIOPSY;  Surgeon: Rogene Houston, MD;  [facility]: [facility];  Service: Endoscopy;;  gastric  . CATARACT EXTRACTION W/PHACO Right 04/15/2014   Procedure: CATARACT EXTRACTION PHACO AND INTRAOCULAR LENS PLACEMENT (IOC);  Surgeon: Tonny Branch, MD;  Location: [facility];  Service: Ophthalmology;  Laterality: Right;  CDE: 13.30  . CATARACT EXTRACTION W/PHACO Left 05/13/2014   Procedure: CATARACT EXTRACTION PHACO AND INTRAOCULAR LENS PLACEMENT LEFT EYE;  Surgeon: Levada Dy  Geoffry Paradise, MD;  Location: [facility];  Service: Ophthalmology;  Laterality: Left;  CDE:13.00  . CHOLECYSTECTOMY N/A 01/15/2015   Procedure: LAPAROSCOPIC CHOLECYSTECTOMY;  Surgeon: Ralene Ok, MD;  Location: [facility];  Service: General;  Laterality: N/A;  . COLONOSCOPY N/A 09/29/2015   Procedure: COLONOSCOPY;  Surgeon: Rogene Houston, MD;  [facility]: [facility];  Service: Endoscopy;  Laterality:  N/A;  . COLONOSCOPY    . ESOPHAGOGASTRODUODENOSCOPY N/A 01/13/2015   Procedure: ESOPHAGOGASTRODUODENOSCOPY (EGD);  Surgeon: Jerene Bears, MD;  Location: [facility];  Service: Endoscopy;  Laterality: N/A;  . ESOPHAGOGASTRODUODENOSCOPY N/A 09/29/2015   Procedure: ESOPHAGOGASTRODUODENOSCOPY (EGD);  Surgeon: Rogene Houston, MD;  [facility]: [facility];  Service: Endoscopy;  Laterality: N/A;  12:15  . GALLBLADDER SURGERY    . LEFT HEART CATH AND CORONARY ANGIOGRAPHY N/A 06/22/2016   Procedure: Left Heart Cath and Coronary Angiography;  Surgeon: Troy Sine, MD;  Location: [facility];  Service: Cardiovascular;  Laterality: N/A;  . SHOULDER OPEN ROTATOR CUFF REPAIR     bilateral  . TOTAL KNEE ARTHROPLASTY  05/26/10   right  . TOTAL KNEE ARTHROPLASTY  11/17/2010   Procedure: TOTAL KNEE ARTHROPLASTY;  Surgeon: Mauri Pole;  Location: [facility];  Service: Orthopedics;  Laterality: Left;  . TOTAL KNEE ARTHROPLASTY     Right  . TOTAL SHOULDER REPLACEMENT  01/2010   left     reports that she has never smoked. She has never used smokeless tobacco. She reports that she does not drink alcohol or use drugs.  Allergies  Allergen Reactions  . Penicillins Rash and Other (See Comments)    Has patient had a PCN reaction causing immediate rash, facial/tongue/throat swelling, SOB or lightheadedness with hypotension: No Has patient had a PCN reaction causing severe rash involving mucus membranes or skin necrosis: No Has patient had a PCN reaction that required hospitalization No Has patient had a PCN reaction occurring within the last 10 years: No If all of the above answers are "NO", then may proceed with Cephalosporin use.    Family History  Problem Relation Age of Onset  . COPD Brother   . Atrial fibrillation Brother   . Diabetes Brother   . Liver cancer Brother        Alcohol-related  . Colon cancer Brother   . Heart attack Father   . Early death Father   . Heart disease Father   .  Rheum arthritis Mother   . Arthritis Mother   . Heart disease Mother   . Coronary artery disease Brother   . Heart attack Daughter   . Gallbladder disease Brother     Prior to Admission medications   Medication Sig Start Date End Date Taking? Authorizing Provider  Acetaminophen (TYLENOL) 325 MG CAPS Take 650 mg by mouth every 6 (six) hours as needed for mild pain.   Yes [provider]  apixaban (ELIQUIS) 2.5 MG TABS tablet Take 1 tablet (2.5 mg total) by mouth 2 (two) times daily for 30 days. 02/22/18 03/24/18 Yes Dettinger, Fransisca Kaufmann, MD  ARTIFICIAL TEAR OP Apply 2 drops to eye daily as needed ([place]). To the left eye   Yes [provider]  atorvastatin (LIPITOR) 20 MG tablet Take 1 tablet (20 mg total) by mouth at bedtime. 11/09/17  Yes Dettinger, Fransisca Kaufmann, MD  digoxin (LANOXIN) 0.125 MG tablet Take 1 tablet (0.125 mg total) by mouth daily. Patient taking differently: Take 0.125 mg by mouth every morning.  12/21/17  Yes Minus Breeding, MD  docusate sodium (COLACE) 100 MG capsule Take 100 mg by mouth at bedtime.   Yes [provider]  donepezil (ARICEPT) 5 MG tablet Take 1 tablet (5 mg total) by mouth at bedtime. FOR MEMORY 10/18/17  Yes Dettinger, Fransisca Kaufmann, MD  flecainide (TAMBOCOR) 100 MG tablet Take 0.5 tablets (50 mg total) by mouth 2 (two) times daily. Take 1/2 tablet 50 mg by mouth twice daily 03/01/18  Yes Dettinger, Fransisca Kaufmann, MD  levETIRAcetam (KEPPRA) 500 MG tablet Take 1 tablet (500 mg total) by mouth 2 (two) times daily for 30 days. 02/22/18 03/24/18 Yes Dettinger, Fransisca Kaufmann, MD  levothyroxine (SYNTHROID, LEVOTHROID) 50 MCG tablet Take 1 tablet daily before breakfast Patient taking differently: Take 50 mcg by mouth daily before breakfast.  11/09/17  Yes Dettinger, Fransisca Kaufmann, MD  metoprolol succinate (TOPROL-XL) 100 MG 24 hr tablet Take 1 tablet (100 mg total) by mouth daily. Take with or immediately following a meal. Patient taking differently: Take 100 mg by  mouth every morning.  11/09/17  Yes Dettinger, Fransisca Kaufmann, MD  mirtazapine (REMERON) 15 MG tablet Take 1 tablet (15 mg total) by mouth at bedtime. 11/09/17  Yes Dettinger, Fransisca Kaufmann, MD  Multiple Vitamin (MULTIVITAMIN WITH MINERALS) TABS tablet Take 1 tablet by mouth every morning.   Yes [provider]  pantoprazole (PROTONIX) 40 MG tablet Take 1 tablet (40 mg total) by mouth daily. Patient taking differently: Take 40 mg by mouth every morning.  11/09/17  Yes Dettinger, Fransisca Kaufmann, MD  Peppermint Oil (IBGARD PO) Take 1 tablet by mouth daily as needed (stomach pain).   Yes [provider]    Physical Exam: Vitals:   03/01/18 1445 03/01/18 1500 03/01/18 1515 03/01/18 1545  BP: (!) 171/128 (!) 187/123 (!) 207/79 (!) 186/167  Pulse:  (!) 134  80  Resp: 20 16  (!) 24  Temp:      TempSrc:      SpO2:  93%  96%  Weight:        Constitutional: Lethargic and sleeping, monitoring, not responding to voice, moves extremities to pain, intermittently opens her eyes spontaneously, s/p Ativan.  Muttering in answer to questions. Vitals:   03/01/18 1445 03/01/18 1500 03/01/18 1515 03/01/18 1545  BP: (!) 171/128 (!) 187/123 (!) 207/79 (!) 186/167  Pulse:  (!) 134  80  Resp: 20 16  (!) 24  Temp:      TempSrc:      SpO2:  93%  96%  Weight:       Eyes: PERRL, lids and conjunctivae normal ENMT: Mucous membranes are dry. Posterior pharynx clear of any exudate or lesions. Neck: normal, supple, no masses, no thyromegaly Respiratory: clear to auscultation bilaterally-anterior auscultation, no wheezing, no crackles. Normal respiratory effort. No accessory muscle use.  Cardiovascular: Irregular rate and rhythm, no murmurs / rubs / gallops. No extremity edema. 2+ pedal pulses. .  Abdomen: no tenderness, no masses palpated. No hepatosplenomegaly. Bowel sounds positive.  Musculoskeletal: no clubbing / cyanosis. No joint deformity upper and lower extremities. Good ROM, no contractures. Normal muscle  tone.  Skin: no rashes, lesions, ulcers. No induration Neurologic: Unable to fully examine 2/2 mental status change.  Not following directions, though awake not alert or oriented , moving all extremities but hard to ascertain focal weakness Psychiatric: Unable to examine due to mental status  Labs on Admission: I have personally reviewed following labs and imaging studies  CBC: Recent Labs  Lab 03/01/18 1353  WBC 7.8  NEUTROABS 4.9  HGB 12.8  HCT 40.5  MCV 93.5  PLT 324   Basic Metabolic Panel: Recent Labs  Lab 03/01/18 1410  NA 133*  K 3.8  CL 99  CO2 25  GLUCOSE 161*  BUN 17  CREATININE 0.54  CALCIUM 8.6*   Liver Function Tests: Recent Labs  Lab 03/01/18 1410  AST 22  ALT 15  ALKPHOS 64  BILITOT 0.5  PROT 6.7  ALBUMIN 3.7   Coagulation Profile: Recent Labs  Lab 03/01/18 1410  INR 1.1   CBG: Recent Labs  Lab 03/01/18 1403  GLUCAP 167*   Urine analysis:    Component Value Date/Time   COLORURINE YELLOW 01/30/2018 [place] 01/30/2018 1358   APPEARANCEUR Clear 01/06/2018 1507   LABSPEC 1.010 01/30/2018 1358   PHURINE 8.0 01/30/2018 1358   GLUCOSEU NEGATIVE 01/30/2018 1358   HGBUR SMALL (A) 01/30/2018 1358   BILIRUBINUR NEGATIVE 01/30/2018 1358   BILIRUBINUR Negative 01/06/2018 1507   KETONESUR 5 (A) 01/30/2018 1358   PROTEINUR NEGATIVE 01/30/2018 1358   UROBILINOGEN 0.2 11/09/2010 1235   NITRITE NEGATIVE 01/30/2018 1358   LEUKOCYTESUR NEGATIVE 01/30/2018 1358   LEUKOCYTESUR Trace (A) 01/06/2018 1507    Radiological Exams on Admission: Ct Angio Head W Or Wo Contrast  Result Date: 03/01/2018 CLINICAL DATA:  Left-sided weakness.  Confusion.  Unsteady gait. EXAM: CT ANGIOGRAPHY HEAD AND NECK CT PERFUSION BRAIN TECHNIQUE: Multidetector CT imaging of the head and neck was performed using the standard protocol during bolus administration of intravenous contrast. Multiplanar CT image reconstructions and MIPs were obtained to evaluate  the vascular anatomy. Carotid stenosis measurements (when applicable) are obtained utilizing NASCET criteria, using the distal internal carotid diameter as the denominator. Multiphase CT imaging of the brain was performed following IV bolus contrast injection. Subsequent parametric perfusion maps were calculated using RAPID software. CONTRAST:  151mL ISOVUE-370 IOPAMIDOL (ISOVUE-370) INJECTION 76% COMPARISON:  Head and neck CTA 01/30/2018. FINDINGS: CTA NECK FINDINGS Aortic arch: Standard 3 vessel aortic arch with mild atherosclerotic plaque. Widely patent arch vessel origins. Right carotid system: Patent with mild atherosclerotic plaque at the carotid bifurcation. No evidence of significant stenosis or dissection. Tortuous mid cervical ICA with kinked appearance. Left carotid system: Patent without evidence of significant stenosis or dissection. Tortuous mid cervical ICA with kinked appearance. Vertebral arteries: Patent and codominant without evidence of significant stenosis or dissection although motion artifact limits assessment of the proximal left V1 segment (this was widely patent on the prior CTA however). Skeleton: Moderate cervical disc degeneration with neural foraminal stenosis due to uncovertebral spurring most notable on the left at C4-5 and bilaterally at C5-6. Other neck: No mass or enlarged lymph nodes identified. Upper chest: Clear lung apices. Review of the MIP images confirms the above findings CTA HEAD FINDINGS Anterior circulation: The internal carotid arteries are widely patent from skull base to carotid termini with mild atherosclerotic plaque noted. A prominent right anterior choroidal artery is incidentally noted. ACAs and MCAs are patent without evidence of proximal branch occlusion or significant proximal stenosis. No aneurysm is identified. Posterior circulation: The intracranial vertebral arteries are widely patent to the basilar. Patent PICA and SCA origins are visualized bilaterally.  The basilar artery is widely patent. Posterior communicating arteries are diminutive or absent. PCAs are patent without evidence of significant stenosis. No aneurysm is identified. Venous sinuses: Patent. Anatomic variants: None. Review of the MIP images confirms the above findings CT Brain Perfusion Findings: CBF (<30%) Volume: 43mL  Perfusion (Tmax>6.0s) volume: 74mL Mismatch Volume: 1mL Infarction Location:No core infarct identified by RAPID. The Tmax volume is present in the right cerebral hemisphere primarily in the MCA territory, however perfusion imaging is significantly motion degraded and multiple data points were discarded casting doubt on the reliability of this data. IMPRESSION: 1. Mild atherosclerosis in the head and neck without evidence of large vessel occlusion or significant stenosis. 2. Motion degraded perfusion imaging of questionable reliability as detailed above. No core infarct identified. These results were called by telephone at the time of interpretation on 03/01/2018 at 3:45 pm to Dr. Dayna Barker, who verbally acknowledged these results. Electronically Signed   By: Logan Bores M.D.   On: 03/01/2018 16:03   Ct Angio Neck W Or Wo Contrast  Result Date: 03/01/2018 CLINICAL DATA:  Left-sided weakness.  Confusion.  Unsteady gait. EXAM: CT ANGIOGRAPHY HEAD AND NECK CT PERFUSION BRAIN TECHNIQUE: Multidetector CT imaging of the head and neck was performed using the standard protocol during bolus administration of intravenous contrast. Multiplanar CT image reconstructions and MIPs were obtained to evaluate the vascular anatomy. Carotid stenosis measurements (when applicable) are obtained utilizing NASCET criteria, using the distal internal carotid diameter as the denominator. Multiphase CT imaging of the brain was performed following IV bolus contrast injection. Subsequent parametric perfusion maps were calculated using RAPID software. CONTRAST:  119mL ISOVUE-370 IOPAMIDOL (ISOVUE-370) INJECTION 76%  COMPARISON:  Head and neck CTA 01/30/2018. FINDINGS: CTA NECK FINDINGS Aortic arch: Standard 3 vessel aortic arch with mild atherosclerotic plaque. Widely patent arch vessel origins. Right carotid system: Patent with mild atherosclerotic plaque at the carotid bifurcation. No evidence of significant stenosis or dissection. Tortuous mid cervical ICA with kinked appearance. Left carotid system: Patent without evidence of significant stenosis or dissection. Tortuous mid cervical ICA with kinked appearance. Vertebral arteries: Patent and codominant without evidence of significant stenosis or dissection although motion artifact limits assessment of the proximal left V1 segment (this was widely patent on the prior CTA however). Skeleton: Moderate cervical disc degeneration with neural foraminal stenosis due to uncovertebral spurring most notable on the left at C4-5 and bilaterally at C5-6. Other neck: No mass or enlarged lymph nodes identified. Upper chest: Clear lung apices. Review of the MIP images confirms the above findings CTA HEAD FINDINGS Anterior circulation: The internal carotid arteries are widely patent from skull base to carotid termini with mild atherosclerotic plaque noted. A prominent right anterior choroidal artery is incidentally noted. ACAs and MCAs are patent without evidence of proximal branch occlusion or significant proximal stenosis. No aneurysm is identified. Posterior circulation: The intracranial vertebral arteries are widely patent to the basilar. Patent PICA and SCA origins are visualized bilaterally. The basilar artery is widely patent. Posterior communicating arteries are diminutive or absent. PCAs are patent without evidence of significant stenosis. No aneurysm is identified. Venous sinuses: Patent. Anatomic variants: None. Review of the MIP images confirms the above findings CT Brain Perfusion Findings: CBF (<30%) Volume: 95mL Perfusion (Tmax>6.0s) volume: 3mL Mismatch Volume: 72mL  Infarction Location:No core infarct identified by RAPID. The Tmax volume is present in the right cerebral hemisphere primarily in the MCA territory, however perfusion imaging is significantly motion degraded and multiple data points were discarded casting doubt on the reliability of this data. IMPRESSION: 1. Mild atherosclerosis in the head and neck without evidence of large vessel occlusion or significant stenosis. 2. Motion degraded perfusion imaging of questionable reliability as detailed above. No core infarct identified. These results were called by telephone at the time of  interpretation on 03/01/2018 at 3:45 pm to Dr. Dayna Barker, who verbally acknowledged these results. Electronically Signed   By: Logan Bores M.D.   On: 03/01/2018 16:03   Ct Cerebral Perfusion W Contrast  Result Date: 03/01/2018 CLINICAL DATA:  Left-sided weakness.  Confusion.  Unsteady gait. EXAM: CT ANGIOGRAPHY HEAD AND NECK CT PERFUSION BRAIN TECHNIQUE: Multidetector CT imaging of the head and neck was performed using the standard protocol during bolus administration of intravenous contrast. Multiplanar CT image reconstructions and MIPs were obtained to evaluate the vascular anatomy. Carotid stenosis measurements (when applicable) are obtained utilizing NASCET criteria, using the distal internal carotid diameter as the denominator. Multiphase CT imaging of the brain was performed following IV bolus contrast injection. Subsequent parametric perfusion maps were calculated using RAPID software. CONTRAST:  140mL ISOVUE-370 IOPAMIDOL (ISOVUE-370) INJECTION 76% COMPARISON:  Head and neck CTA 01/30/2018. FINDINGS: CTA NECK FINDINGS Aortic arch: Standard 3 vessel aortic arch with mild atherosclerotic plaque. Widely patent arch vessel origins. Right carotid system: Patent with mild atherosclerotic plaque at the carotid bifurcation. No evidence of significant stenosis or dissection. Tortuous mid cervical ICA with kinked appearance. Left carotid  system: Patent without evidence of significant stenosis or dissection. Tortuous mid cervical ICA with kinked appearance. Vertebral arteries: Patent and codominant without evidence of significant stenosis or dissection although motion artifact limits assessment of the proximal left V1 segment (this was widely patent on the prior CTA however). Skeleton: Moderate cervical disc degeneration with neural foraminal stenosis due to uncovertebral spurring most notable on the left at C4-5 and bilaterally at C5-6. Other neck: No mass or enlarged lymph nodes identified. Upper chest: Clear lung apices. Review of the MIP images confirms the above findings CTA HEAD FINDINGS Anterior circulation: The internal carotid arteries are widely patent from skull base to carotid termini with mild atherosclerotic plaque noted. A prominent right anterior choroidal artery is incidentally noted. ACAs and MCAs are patent without evidence of proximal branch occlusion or significant proximal stenosis. No aneurysm is identified. Posterior circulation: The intracranial vertebral arteries are widely patent to the basilar. Patent PICA and SCA origins are visualized bilaterally. The basilar artery is widely patent. Posterior communicating arteries are diminutive or absent. PCAs are patent without evidence of significant stenosis. No aneurysm is identified. Venous sinuses: Patent. Anatomic variants: None. Review of the MIP images confirms the above findings CT Brain Perfusion Findings: CBF (<30%) Volume: 81mL Perfusion (Tmax>6.0s) volume: 49mL Mismatch Volume: 59mL Infarction Location:No core infarct identified by RAPID. The Tmax volume is present in the right cerebral hemisphere primarily in the MCA territory, however perfusion imaging is significantly motion degraded and multiple data points were discarded casting doubt on the reliability of this data. IMPRESSION: 1. Mild atherosclerosis in the head and neck without evidence of large vessel occlusion  or significant stenosis. 2. Motion degraded perfusion imaging of questionable reliability as detailed above. No core infarct identified. These results were called by telephone at the time of interpretation on 03/01/2018 at 3:45 pm to Dr. Dayna Barker, who verbally acknowledged these results. Electronically Signed   By: Logan Bores M.D.   On: 03/01/2018 16:03   Ct Head Code Stroke Wo Contrast  Result Date: 03/01/2018 CLINICAL DATA:  Code stroke. Left-sided weakness. Left facial droop. EXAM: CT HEAD WITHOUT CONTRAST TECHNIQUE: Contiguous axial images were obtained from the base of the skull through the vertex without intravenous contrast. COMPARISON:  MRI 01/30/2018, CT head 01/30/2018 FINDINGS: Brain: Negative for acute infarct.  Negative for hemorrhage or mass Generalized atrophy. Chronic  microvascular ischemic changes in the white matter are moderately advanced since stable. Vascular: Negative for hyperdense vessel Skull: Negative Sinuses/Orbits: Paranasal sinuses clear. Bilateral cataract surgery. Other: None ASPECTS ([place] Stroke Program Early CT Score) - Ganglionic level infarction (caudate, lentiform nuclei, internal capsule, insula, M1-M3 cortex): 7 - Supraganglionic infarction (M4-M6 cortex): 3 Total score (0-10 with 10 being normal): 10 IMPRESSION: 1. No acute intracranial abnormality. 2. Atrophy and chronic microvascular ischemic changes stable. 3. ASPECTS is 10 4. These results were called by telephone at the time of interpretation on 03/01/2018 at 2:03 pm to Dr. Thurnell Garbe, who verbally acknowledged these results. Electronically Signed   By: Franchot Gallo M.D.   On: 03/01/2018 14:04    EKG: Independently reviewed. Atria Fibrillation.  No change from prior.  Assessment/Plan Active Problems:   Left-sided weakness   Metabolic encephalopathy-in the setting of possible CVA, versus seizures, also element of benzodiazepine contributing to alteration in mental status.  History of dementia. Now with fever  101.7, cough, intermittent tachycardia, mild hypoxia, intermittent tachypnea.  UA not suggestive of infection.  With respiratory symptoms and recent hospitalization will start empiric coverage for pneumonia. - Port chest x-ray, still pending, if negative repeat in a.m. -Influenza panel- negative -Blood cultures,  - urine cultures -N. P.o. - Sitter at bedside -Lactic acid- 2, trend, 573ml bolus - N/s + 20 KCL 100cc/hr x 15hrs - BMP, CBC, Mag a.m - IV vanc and cefepime pharm to dose  Left-sided weakness, -reported seizure activity today.  Head CT, cerebral perfusion and CTA head and neck negative for acute abnormality.  Recent presentation-aphasia attributed to TIA versus seizures.  With negative work-up. -MRI brain -Neurology consult  Seizures- Likely provoked by infection.  1000 mg IV Keppra given in ED -Continue Keppra at home dose 500 mg twice daily switch to IV - EEG  Atrial fibrillation/flutter-intermittent tachycardia, likely driven by fever.   - Hold home medications digoxin, flecainide, metoprolol Eliquis while n.p.o, with improvement in mental status -PRN IV metoprolol  Hypothyroidism -Home Synthroid   DVT prophylaxis: Scds Code Status: Limited Code Blue. Do not Intubate. No CPR. Family Communication: Son- Ray, at bedside he is the healthcare power of attorney.  Niece and caregiver also at bedside. Disposition Plan: Per rounding team Consults called: Neurology Admission status: Obs,  Tele    Bethena Roys MD [facility]  03/01/2018, 9:54 PM

## 2018-03-01 NOTE — Progress Notes (Addendum)
Patient is drowsy and unable to participate in complete neuro checks at this time. Patient will respond to voice and is able to state her name. Family at bedside. Dr. Denton Brick notified. Will continue to monitor.

## 2018-03-01 NOTE — Progress Notes (Signed)
CRITICAL VALUE ALERT  Critical Value:  Lactic 2  Date & Time Notied:  03/01/18 2200  Provider Notified: Dr. Denton Brick   Orders Received/Actions taken: New orders placed

## 2018-03-01 NOTE — ED Notes (Signed)
Several nurses attempting to obtain IV placement. Unable to get BP at this time

## 2018-03-01 NOTE — ED Notes (Signed)
Pt transported to CT ?

## 2018-03-01 NOTE — ED Notes (Signed)
Teleneuro states no TPA to be given.

## 2018-03-01 NOTE — ED Notes (Signed)
Called and notified CT of Code STROKE @ 1330.  Beeped out Code Stroke @ 8864.

## 2018-03-01 NOTE — ED Provider Notes (Signed)
Emergency Department Provider Note   I have reviewed the triage vital signs and the nursing notes.   HISTORY  Chief Complaint Code Stroke   HPI [name] is a 85 y.o. female with multiple medical problems as documented below the presents to the emergency department today as a code stroke secondary to acute onset of difficulty walking followed by left-sided weakness.  Patient is with EMS who states that the patient was noticed to have a wide-based gait and kept falling into the walls on the left since about 930 this morning but then around 1:00 patient had significant difficulty using her left arm and her left leg.  She was still be able to speak however because of the symptoms EMS was called code stroke was activated she is found to be hypertensive but brought here for further evaluation.  Further history is limited secondary to acuity of situation.  Level 5 caveat applies.  Past Medical History:  Diagnosis Date  . Anemia   . Atrial fibrillation (HCC)    Paroxysmal, Flecainide therapy  . Calf pain    September, 2012, at rest  . Carotid bruit    Doppler, December, 2009, no abnormality  . Cataract   . Diverticulosis   . GERD (gastroesophageal reflux disease)   . History of shingles   . HLD (hyperlipidemia)   . HTN (hypertension)   . Hypothyroidism   . Insomnia   . Osteoarthritis   . PONV (postoperative nausea and vomiting)   . Rectal bleeding 2001   diverticulosis and int hemorrhoids on 07/1999 and 02/2010 colonoscopies.  . Seizures ([place])   . Stroke ([place])   . Warfarin anticoagulation     Patient Active Problem List   Diagnosis Date Noted  . Aphasia 01/30/2018  . CVA (cerebral vascular accident) ([place]) 01/30/2018  . History of Clostridioides difficile colitis 12/23/2017  . Mild dementia ([place]) 12/23/2017  . History of TIA (transient ischemic attack) 03/15/2017  . Osteoporosis 02/24/2017  . Prolonged QT interval 01/28/2017  . Unstable angina ([place]) 06/16/2016  .  Atrial flutter ([place]) 06/16/2016  . Overactive bladder 05/02/2015  . History of CVA (cerebrovascular accident) 04/21/2015  . History of dysarthria 02/20/2015  . HTN (hypertension) 01/11/2015  . Hyponatremia 01/11/2015  . Chronic pain syndrome 11/25/2014  . Insomnia 11/25/2014  . Chronic diastolic CHF (congestive heart failure) ([place]) 11/12/2014  . Paroxysmal atrial fibrillation ([place]) 10/14/2014  . Warfarin anticoagulation   . Hypothyroidism   . Carotid bruit   . S/P knee replacement   . DIVERTICULOSIS-COLON 01/19/2010    Past Surgical History:  Procedure Laterality Date  . BIOPSY  09/29/2015   Procedure: BIOPSY;  Surgeon: Rogene Houston, MD;  [facility];  Service: Endoscopy;;  gastric  . CATARACT EXTRACTION W/PHACO Right 04/15/2014   Procedure: CATARACT EXTRACTION PHACO AND INTRAOCULAR LENS PLACEMENT (IOC);  Surgeon: Tonny Branch, MD;  Location: [facility];  Service: Ophthalmology;  Laterality: Right;  CDE: 13.30  . CATARACT EXTRACTION W/PHACO Left 05/13/2014   Procedure: CATARACT EXTRACTION PHACO AND INTRAOCULAR LENS PLACEMENT LEFT EYE;  Surgeon: Tonny Branch, MD;  Location: [facility];  Service: Ophthalmology;  Laterality: Left;  CDE:13.00  . CHOLECYSTECTOMY N/A 01/15/2015   Procedure: LAPAROSCOPIC CHOLECYSTECTOMY;  Surgeon: Ralene Ok, MD;  Location: [facility];  Service: General;  Laterality: N/A;  . COLONOSCOPY N/A 09/29/2015   Procedure: COLONOSCOPY;  Surgeon: Rogene Houston, MD;  [facility];  Service: Endoscopy;  Laterality: N/A;  . COLONOSCOPY    .  ESOPHAGOGASTRODUODENOSCOPY N/A 01/13/2015   Procedure: ESOPHAGOGASTRODUODENOSCOPY (EGD);  Surgeon: Jerene Bears, MD;  Location: [facility];  Service: Endoscopy;  Laterality: N/A;  . ESOPHAGOGASTRODUODENOSCOPY N/A 09/29/2015   Procedure: ESOPHAGOGASTRODUODENOSCOPY (EGD);  Surgeon: Rogene Houston, MD;  [facility];  Service: Endoscopy;  Laterality: N/A;  12:15  . GALLBLADDER SURGERY    . LEFT HEART CATH AND  CORONARY ANGIOGRAPHY N/A 06/22/2016   Procedure: Left Heart Cath and Coronary Angiography;  Surgeon: Troy Sine, MD;  Location: [facility];  Service: Cardiovascular;  Laterality: N/A;  . SHOULDER OPEN ROTATOR CUFF REPAIR     bilateral  . TOTAL KNEE ARTHROPLASTY  05/26/10   right  . TOTAL KNEE ARTHROPLASTY  11/17/2010   Procedure: TOTAL KNEE ARTHROPLASTY;  Surgeon: Mauri Pole;  Location: [facility];  Service: Orthopedics;  Laterality: Left;  . TOTAL KNEE ARTHROPLASTY     Right  . TOTAL SHOULDER REPLACEMENT  01/2010   left    Current Outpatient Rx  . Order #: [id number] Class: Historical Med  . Order #: [id number] Class: Normal  . Order #: [id number] Class: Historical Med  . Order #: [id number] Class: Normal  . Order #: [id number] Class: Normal  . Order #: [id number] Class: Normal  . Order #: [id number] Class: Normal  . Order #: [id number] Class: Normal  . Order #: [id number] Class: Normal  . Order #: [id number] Class: Normal  . Order #: [id number] Class: Normal  . Order #: [id number] Class: Normal  . Order #: [id number] Class: Normal  . Order #: [id number] Class: Normal  . Order #: [id number] Class: Historical Med  . Order #: [id number] Class: Normal  . Order #: [id number] Class: Normal    Allergies Penicillins  Family History  Problem Relation Age of Onset  . COPD Brother   . Atrial fibrillation Brother   . Diabetes Brother   . Liver cancer Brother        Alcohol-related  . Colon cancer Brother   . Heart attack Father   . Early death Father   . Heart disease Father   . Rheum arthritis Mother   . Arthritis Mother   . Heart disease Mother   . Coronary artery disease Brother   . Heart attack Daughter   . Gallbladder disease Brother     Social History Social History   Tobacco Use  . Smoking status: Never Smoker  . Smokeless tobacco: Never Used  Substance Use Topics  . Alcohol use: No  . Drug use: No    Review of Systems   Level 5 caveat  applies. ____________________________________________   PHYSICAL EXAM:  VITAL SIGNS: ED Triage Vitals  Enc Vitals Group     BP 03/01/18 1412 (!) 178/139     Pulse Rate 03/01/18 1412 (!) 104     Resp 03/01/18 1412 18     Temp 03/01/18 1412 98.8 F (37.1 C)     Temp Source 03/01/18 1412 Oral     SpO2 03/01/18 1412 99 %     Weight 03/01/18 1358 131 lb 4 oz (59.5 kg)    Constitutional: Alert and disoriented. Well appearing and in no acute distress. Eyes: Conjunctivae are normal. PERRL. EOMI. Head: Atraumatic. Nose: No congestion/rhinnorhea. Mouth/Throat: Mucous membranes are moist.  Oropharynx non-erythematous. Neck: No stridor.  No meningeal signs.   Cardiovascular: Normal rate, regular rhythm. Good peripheral circulation. Grossly normal heart sounds.   Respiratory: Normal respiratory effort.  No retractions. Lungs CTAB. Gastrointestinal: Soft and nontender. No distention.  Musculoskeletal: No lower extremity tenderness nor edema. No gross deformities of extremities. Neurologic:  Diminished speech but does speak. Symmetric smile, eyebrow raise and EOM. PERRL. Tongue midline. Diminished grip strength on left. Diminished left dorsi/plantar flexion.  Skin:  Skin is warm, dry and intact. No rash noted.  ____________________________________________   LABS (all labs ordered are listed, but only abnormal results are displayed)  Labs Reviewed  COMPREHENSIVE METABOLIC PANEL - Abnormal; Notable for the following components:      Result Value   Sodium 133 (*)    Glucose, Bld 161 (*)    Calcium 8.6 (*)    All other components within normal limits  CBG MONITORING, ED - Abnormal; Notable for the following components:   Glucose-Capillary 167 (*)    All other components within normal limits  ETHANOL  PROTIME-INR  APTT  CBC  DIFFERENTIAL  RAPID URINE DRUG SCREEN, HOSP PERFORMED  URINALYSIS, ROUTINE W REFLEX MICROSCOPIC   ____________________________________________  EKG   EKG  Interpretation  Date/Time:  Wednesday March 01 2018 14:13:03 EST Ventricular Rate:  108 PR Interval:    QRS Duration: 106 QT Interval:  309 QTC Calculation: 357 R Axis:   -36 Text Interpretation:  Atrial fibrillation Ventricular premature complex Left axis deviation Anterior infarct, old Borderline ST depression, diffuse leads similar to january 27 Confirmed by Merrily Pew [phone]) on 03/01/2018 3:03:19 PM      ____________________________________________  RADIOLOGY  Ct Head Code Stroke Wo Contrast  Result Date: 03/01/2018 CLINICAL DATA:  Code stroke. Left-sided weakness. Left facial droop. EXAM: CT HEAD WITHOUT CONTRAST TECHNIQUE: Contiguous axial images were obtained from the base of the skull through the vertex without intravenous contrast. COMPARISON:  MRI 01/30/2018, CT head 01/30/2018 FINDINGS: Brain: Negative for acute infarct.  Negative for hemorrhage or mass Generalized atrophy. Chronic microvascular ischemic changes in the white matter are moderately advanced since stable. Vascular: Negative for hyperdense vessel Skull: Negative Sinuses/Orbits: Paranasal sinuses clear. Bilateral cataract surgery. Other: None ASPECTS (Patrick Stroke Program Early CT Score) - Ganglionic level infarction (caudate, lentiform nuclei, internal capsule, insula, M1-M3 cortex): 7 - Supraganglionic infarction (M4-M6 cortex): 3 Total score (0-10 with 10 being normal): 10 IMPRESSION: 1. No acute intracranial abnormality. 2. Atrophy and chronic microvascular ischemic changes stable. 3. ASPECTS is 10 4. These results were called by telephone at the time of interpretation on 03/01/2018 at 2:03 pm to Dr. Thurnell Garbe, who verbally acknowledged these results. Electronically Signed   By: Franchot Gallo M.D.   On: 03/01/2018 14:04   ____________________________________________   PROCEDURES  Procedure(s) performed:   Procedures  CRITICAL CARE Performed by: Merrily Pew Total critical care time: 35  minutes Critical care time was exclusive of separately billable procedures and treating other patients. Critical care was necessary to treat or prevent imminent or life-threatening deterioration. Critical care was time spent personally by me on the following activities: development of treatment plan with patient and/or surrogate as well as nursing, discussions with consultants, evaluation of patient's response to treatment, examination of patient, obtaining history from patient or surrogate, ordering and performing treatments and interventions, ordering and review of laboratory studies, ordering and review of radiographic studies, pulse oximetry and re-evaluation of patient's condition.  Angiocath insertion Performed by: Merrily Pew  Consent: Verbal consent obtained. Risks and benefits: risks, benefits and alternatives were discussed Time out: Immediately prior to procedure a "time out" was called to verify the correct patient, procedure, equipment, support staff and site/side marked as required.  Preparation: Patient was prepped and draped in the usual sterile fashion.  Vein Location: left basilic  Ultrasound Guided  Gauge: 18  Normal blood return and flush without difficulty Patient tolerance: Patient tolerated the procedure well with no immediate complications.  ____________________________________________   INITIAL IMPRESSION / ASSESSMENT AND PLAN / ED COURSE  Code stroke initiated patient protecting airway will go to CT scan.  Tele-neurology thinks that patient has a high likelihood of having a right MCA occlusion.  Is requesting perfusion and contact with on-call neurologist for possible IR interventions.  IV placed by myself as per above note.  Disussed with Dr. Rory Percy at [facility] who states that he members this patient and she often has focal seizures and thinks that might be was going on this time but will follow along for the CTA.  CTA unremarkable. Dr. Rory Percy suggests  admission here with neuro consult. Discussed same with hospitalist.   reeval with some shaking/tremors of right arm. Unclear etiology. Had an episode of vomiting as well. Anti-emetics given, anti epileptics given.     Pertinent labs & imaging results that were available during my care of the patient were reviewed by me and considered in my medical decision making (see chart for details).  ____________________________________________  FINAL CLINICAL IMPRESSION(S) / ED DIAGNOSES  Final diagnoses:  None     MEDICATIONS GIVEN DURING THIS VISIT:  Medications - No data to display   NEW OUTPATIENT MEDICATIONS STARTED DURING THIS VISIT:  New Prescriptions   No medications on file    Note:  This note was prepared with assistance of Dragon voice recognition software. Occasional wrong-word or sound-a-like substitutions may have occurred due to the inherent limitations of voice recognition software.   Makaiah Terwilliger, Corene Cornea, MD 03/02/18 [phone]

## 2018-03-02 ENCOUNTER — Observation Stay (HOSPITAL_COMMUNITY)
Admit: 2018-03-02 | Discharge: 2018-03-02 | Disposition: A | Discharge status: Home / Self Care | Payer: Medicare Other | Attending: Family Medicine | Admitting: Family Medicine

## 2018-03-02 DIAGNOSIS — R531 Weakness: Secondary | ICD-10-CM

## 2018-03-02 DIAGNOSIS — R569 Unspecified convulsions: Secondary | ICD-10-CM

## 2018-03-02 DIAGNOSIS — E039 Hypothyroidism, unspecified: Secondary | ICD-10-CM

## 2018-03-02 DIAGNOSIS — I10 Essential (primary) hypertension: Secondary | ICD-10-CM

## 2018-03-02 DIAGNOSIS — G9340 Encephalopathy, unspecified: Secondary | ICD-10-CM

## 2018-03-02 DIAGNOSIS — I480 Paroxysmal atrial fibrillation: Secondary | ICD-10-CM

## 2018-03-02 LAB — CBC
HCT: 40.5 % (ref 36.0–46.0)
Hemoglobin: 13 g/dL (ref 12.0–15.0)
MCH: 30.7 pg (ref 26.0–34.0)
MCHC: 32.1 g/dL (ref 30.0–36.0)
MCV: 95.5 fL (ref 80.0–100.0)
Platelets: 256 10*3/uL (ref 150–400)
RBC: 4.24 MIL/uL (ref 3.87–5.11)
RDW: 15.4 % (ref 11.5–15.5)
WBC: 8.4 10*3/uL (ref 4.0–10.5)
nRBC: 0 % (ref 0.0–0.2)

## 2018-03-02 LAB — LACTIC ACID, PLASMA: Lactic Acid, Venous: 1.1 mmol/L (ref 0.5–1.9)

## 2018-03-02 LAB — BASIC METABOLIC PANEL
Anion gap: 9 (ref 5–15)
BUN: 11 mg/dL (ref 8–23)
CO2: 27 mmol/L (ref 22–32)
Calcium: 8.4 mg/dL — ABNORMAL LOW (ref 8.9–10.3)
Chloride: 98 mmol/L (ref 98–111)
Creatinine, Ser: 0.49 mg/dL (ref 0.44–1.00)
GFR calc Af Amer: >60 mL/min (ref >60)
GFR calc non Af Amer: >60 mL/min (ref >60)
Glucose, Bld: 96 mg/dL (ref 70–99)
Potassium: 3.8 mmol/L (ref 3.5–5.1)
Sodium: 134 mmol/L — ABNORMAL LOW (ref 135–145)

## 2018-03-02 LAB — MAGNESIUM: Magnesium: 1.9 mg/dL (ref 1.7–2.4)

## 2018-03-02 LAB — TROPONIN I
Troponin I: 0.05 ng/mL (ref <0.03)
Troponin I: 0.04 ng/mL (ref <0.03)
Troponin I: <0.03 ng/mL (ref <0.03)

## 2018-03-02 MED ORDER — KETOROLAC TROMETHAMINE 15 MG/ML IJ SOLN
15.0000 mg | Freq: Once | INTRAMUSCULAR | Status: AC
  Administered 2018-03-02: 15 mg via INTRAVENOUS
  Filled 2018-03-02: qty 1

## 2018-03-02 MED ORDER — VANCOMYCIN HCL IN DEXTROSE 750-5 MG/150ML-% IV SOLN
750.0000 mg | INTRAVENOUS | Status: DC
  Filled 2018-03-02: qty 150

## 2018-03-02 MED ORDER — SODIUM CHLORIDE 0.9 % IV SOLN
750.0000 mg | Freq: Two times a day (BID) | INTRAVENOUS | Status: DC
  Administered 2018-03-03 – 2018-03-04 (×3): 750 mg via INTRAVENOUS
  Filled 2018-03-02 (×8): qty 7.5

## 2018-03-02 MED ORDER — METOPROLOL TARTRATE 5 MG/5ML IV SOLN: 5.0000 mg | INTRAVENOUS | Status: DC | PRN

## 2018-03-02 MED ORDER — SODIUM CHLORIDE 0.9 % IV SOLN
2.0000 g | INTRAVENOUS | Status: DC
  Administered 2018-03-03 – 2018-03-04 (×3): 2 g via INTRAVENOUS
  Filled 2018-03-02 (×4): qty 2

## 2018-03-02 MED ORDER — DILTIAZEM HCL 100 MG IV SOLR
5.0000 mg/h | INTRAVENOUS | Status: DC
  Filled 2018-03-02 (×2): qty 100

## 2018-03-02 NOTE — Progress Notes (Signed)
EEG completed; results pending.    

## 2018-03-02 NOTE — Progress Notes (Signed)
Pharmacy Antibiotic Note  [name] is a 85 y.o. female admitted on 03/01/2018 with pneumonia.  Pharmacy has been consulted for vancomycin and cefepime  dosing.  Plan:  Start cefepime 2 IV q24h Loading dose:  vancomycin 500mg  IV x1 dose  Maintenance dose:  vancomycin 750mg  IV q24h Goal vancomycin  trough range:  15-20 mcg/mL Pharmacy will continue to monitor renal function, vancomycin troughs as clinically indicated, cultures and patient progress.  Height: 5\' 2"  (157.5 cm) Weight: 126 lb 1.7 oz (57.2 kg) IBW/kg (Calculated) : 50.1  Temp (24hrs), Avg:99.3 F (37.4 C), Min:98.3 F (36.8 C), Max:101.7 F (38.7 C)  Recent Labs  Lab 03/01/18 1353 03/01/18 1410 03/01/18 2058 03/02/18 0056  WBC 7.8  --   --   --   CREATININE  --  0.54  --   --   LATICACIDVEN  --   --  2.0* 1.1    Estimated Creatinine Clearance: 42.1 mL/min (by C-G formula based on SCr of 0.54 mg/dL).    Allergies  Allergen Reactions  . Penicillins Rash and Other (See Comments)    Has patient had a PCN reaction causing immediate rash, facial/tongue/throat swelling, SOB or lightheadedness with hypotension: No Has patient had a PCN reaction causing severe rash involving mucus membranes or skin necrosis: No Has patient had a PCN reaction that required hospitalization No Has patient had a PCN reaction occurring within the last 10 years: No If all of the above answers are "NO", then may proceed with Cephalosporin use.    Antimicrobials this admission: cefepime 2/27 >>   vancomycin  2/27>>     Microbiology results: 2/26 [facility] x2:  2/26 UCx:       Thank you for allowing pharmacy to be a part of this patient's care.  Despina Pole 03/02/2018 7:51 AM

## 2018-03-02 NOTE — Procedures (Addendum)
ELECTROENCEPHALOGRAM REPORT   Patient: [name]       Room #: Z366 EEG No. ID: [id number] Age: 85 y.o.        Sex: female Referring Physician: Memon Report Date:  03/02/2018        Interpreting Physician: Alexis Goodell  History: [name] is an 85 y.o. female with altered mental status and focal jerking  Medications:  Maxipime, Keppra, Vancomycin  Conditions of Recording:  This is a 21 channel routine scalp EEG performed with bipolar and monopolar montages arranged in accordance to the international 10/20 system of electrode placement. One channel was dedicated to EKG recording.  The patient is in the awake state.  Description:  The background activity is asymmetric. The right hemisphere is slow and poorly organized.  It consists of a low voltage, polymorphic delta activity that is continuous and diffusely distributed over the right hemisphere.  Over the left hemisphere although there is an underlying polymorphic delta activity noted often, there are faster frequencies noted with a posterior background rhythm noted at a maximum of 8-9 Hz alpha.    No epileptiform activity is noted.   The patient does not drowse or sleep. Hyperventilation and intermittent photic stimulation were not performed.   IMPRESSION: This is an abnormal electroencephalogram secondary to right hemispheric slowing.  This suggests a focal disturbance, etiology nonspecific.      Alexis Goodell, MD Neurology [phone] 03/02/2018, 12:36 PM

## 2018-03-02 NOTE — Consult Note (Signed)
Terri A. Merlene Laughter, MD     [email or web address]          [name] is an 85 y.o. female.   ASSESSMENT/PLAN: 1.  Acute confusional state/altered mental status with generalized tonic-clonic seizures: The patient's Keppra will be increased.  The dose will be increased to 750 mg twice a day.  Repeat neurological evaluation.  If no improvement after 24 hours, consider repeat EEG. 2.  Atrial fibrillation on Eliquis continue with this. 3.  Apparent baseline history of cognitive impairment        This 85 year old white female who presents with acute confusion and jerking.  The description given to the admitting physician suggested that this may be generalized tonic-clonic seizure activity.  She was given Ativan.  She has been lethargic and drowsy afterwards.  She was previously seen few months ago for apparent recurrent confusion/ possible aphasia.  At that time she may have been on Keppra 250 twice a day.  EEG at that time showed no seizure activity and was essentially normal.  It appears that she her Keppra was increased to  500 mg twice a day.  The notes indicate that although she was on Keppra 250 twice a day, it is unclear there is a history of seizures.  She has had episodes of confusion.  I saw the patient in 2017.  Notes are outlined below.  She has had 2 EEGs in 2017 and also 2019 for recurrent episodes but both were unrevealing.  Recent EEG below however shows right hemispheric slowing.   She also was seen few months ago for episode of left-sided focal neurological symptoms.  The thought was that the patient had a TIA due to subtherapeutic Coumadin for which she is being on for atrial fibrillation.   GENERAL: The patient is laying in bed and in no acute distress.  She required stimulation to respond.  HEENT: Neck is supple  ABDOMEN: soft  EXTREMITIES: There is marked swelling of the left upper extremity with apparent hemorrhage at the antecubital fossa.     BACK:  Normal  SKIN: Normal by inspection.    MENTAL STATUS: She lays in bed with eyes closed.  She does opens eyes to light tactile stimulation.  She states that she is at the [place].  She requires constant stimulation to stay awake.  She does not follow commands.  No dysarthria is appreciated.  CRANIAL NERVES: Pupils are equal, round and reactive to light; extra ocular movements are full, there is no significant nystagmus; visual fields -seems full by direct threat; upper and lower facial muscles are normal in strength and symmetric, there is no flattening of the nasolabial folds;   MOTOR: She has at least antigravity strength of the lower extremities.  She moves the upper extremities vigorously and has at least 4/5 strength there.  Bulk and tone are normal.  COORDINATION: No tremors are appreciated.  No dysmetria or parkinsonism.  REFLEXES: Deep tendon reflexes are symmetrical and normal.   SENSATION: She responds to painful stimuli bilaterally.    NEURO NOTE 2017 Acute confusional spell of unclear etiology. The semiology and imaging argues against intracranial processes and infarcts. The patient could be having complex partial seizures however. No metabolic disturbance uncovered to explain the spell.  Chronic atrial fibrillation on chronic warfarin therapy.  RECOMMENDATION: Continue with warfarin. No need to add aspirin as the patient has not had a clear infarct. EEG. Additional dementia labs.  Patient 85 year old white female who had acute onset of  confusion and disorientation. The patient is amnestic to the event. The event lasted for about 2 hours. The husband reports that she had a spelled similarly about several months ago. She was told that she may have had a mini stroke or a warning stroke but imaging was unrevealing. The patient has been worked up during this hospitalization imaging has been mostly unrevealing. She is on warfarin therapy for chronic atrial  fibrillation and has been compliant with this. INR is repeated. The patient does not report having focal numbness, weakness, chest pain or shortness of breath. She reports having left upper extremity pain the day before she had this event. She has had some headaches and some lightheadedness on yesterday. She has had some chronic frontal headaches recently. Review systems otherwise negative.            Blood pressure (!) 150/88, pulse 84, temperature 98.5 F (36.9 C), temperature source Oral, resp. rate 17, height 5\' 2"  (1.575 m), weight 57.2 kg, SpO2 93 %.  Past Medical History:  Diagnosis Date  . Anemia   . Atrial fibrillation (HCC)    Paroxysmal, Flecainide therapy  . Calf pain    September, 2012, at rest  . Carotid bruit    Doppler, December, 2009, no abnormality  . Cataract   . Diverticulosis   . GERD (gastroesophageal reflux disease)   . History of shingles   . HLD (hyperlipidemia)   . HTN (hypertension)   . Hypothyroidism   . Insomnia   . Osteoarthritis   . PONV (postoperative nausea and vomiting)   . Rectal bleeding 2001   diverticulosis and int hemorrhoids on 07/1999 and 02/2010 colonoscopies.  . Seizures ([place])   . Stroke ([place])   . Warfarin anticoagulation     Past Surgical History:  Procedure Laterality Date  . BIOPSY  09/29/2015   Procedure: BIOPSY;  Surgeon: Rogene Houston, MD;  [facility];  Service: Endoscopy;;  gastric  . CATARACT EXTRACTION W/PHACO Right 04/15/2014   Procedure: CATARACT EXTRACTION PHACO AND INTRAOCULAR LENS PLACEMENT (IOC);  Surgeon: Tonny Branch, MD;  Location: [facility];  Service: Ophthalmology;  Laterality: Right;  CDE: 13.30  . CATARACT EXTRACTION W/PHACO Left 05/13/2014   Procedure: CATARACT EXTRACTION PHACO AND INTRAOCULAR LENS PLACEMENT LEFT EYE;  Surgeon: Tonny Branch, MD;  Location: [facility];  Service: Ophthalmology;  Laterality: Left;  CDE:13.00  . CHOLECYSTECTOMY N/A 01/15/2015   Procedure: LAPAROSCOPIC CHOLECYSTECTOMY;   Surgeon: Ralene Ok, MD;  Location: [place] [facility];  Service: General;  Laterality: N/A;  . COLONOSCOPY N/A 09/29/2015   Procedure: COLONOSCOPY;  Surgeon: Rogene Houston, MD;  [facility];  Service: Endoscopy;  Laterality: N/A;  . COLONOSCOPY    . ESOPHAGOGASTRODUODENOSCOPY N/A 01/13/2015   Procedure: ESOPHAGOGASTRODUODENOSCOPY (EGD);  Surgeon: Jerene Bears, MD;  Location[facility];  Service: Endoscopy;  Laterality: N/A;  . ESOPHAGOGASTRODUODENOSCOPY N/A 09/29/2015   Procedure: ESOPHAGOGASTRODUODENOSCOPY (EGD);  Surgeon: Rogene Houston, MD;  [facility];  Service: Endoscopy;  Laterality: N/A;  12:15  . GALLBLADDER SURGERY    . LEFT HEART CATH AND CORONARY ANGIOGRAPHY N/A 06/22/2016   Procedure: Left Heart Cath and Coronary Angiography;  Surgeon: Troy Sine, MD;  Location: [facility];  Service: Cardiovascular;  Laterality: N/A;  . SHOULDER OPEN ROTATOR CUFF REPAIR     bilateral  . TOTAL KNEE ARTHROPLASTY  05/26/10   right  . TOTAL KNEE ARTHROPLASTY  11/17/2010   Procedure: TOTAL KNEE ARTHROPLASTY;  Surgeon: Pietro Cassis  Alvan Dame;  Location: [facility];  Service: Orthopedics;  Laterality: Left;  . TOTAL KNEE ARTHROPLASTY     Right  . TOTAL SHOULDER REPLACEMENT  01/2010   left    Family History  Problem Relation Age of Onset  . COPD Brother   . Atrial fibrillation Brother   . Diabetes Brother   . Liver cancer Brother        Alcohol-related  . Colon cancer Brother   . Heart attack Father   . Early death Father   . Heart disease Father   . Rheum arthritis Mother   . Arthritis Mother   . Heart disease Mother   . Coronary artery disease Brother   . Heart attack Daughter   . Gallbladder disease Brother     Social History:  reports that she has never smoked. She has never used smokeless tobacco. She reports that she does not drink alcohol or use drugs.  Allergies:  Allergies  Allergen Reactions  . Penicillins Rash and Other (See Comments)    Has patient had  a PCN reaction causing immediate rash, facial/tongue/throat swelling, SOB or lightheadedness with hypotension: No Has patient had a PCN reaction causing severe rash involving mucus membranes or skin necrosis: No Has patient had a PCN reaction that required hospitalization No Has patient had a PCN reaction occurring within the last 10 years: No If all of the above answers are "NO", then may proceed with Cephalosporin use.    Medications: Prior to Admission medications   Medication Sig Start Date End Date Taking? Authorizing Provider  Acetaminophen (TYLENOL) 325 MG CAPS Take 650 mg by mouth every 6 (six) hours as needed for mild pain.   Yes [provider]  apixaban (ELIQUIS) 2.5 MG TABS tablet Take 1 tablet (2.5 mg total) by mouth 2 (two) times daily for 30 days. 02/22/18 03/24/18 Yes Dettinger, Fransisca Kaufmann, MD  ARTIFICIAL TEAR OP Apply 2 drops to eye daily as needed (Dryness). To the left eye   Yes [provider]  atorvastatin (LIPITOR) 20 MG tablet Take 1 tablet (20 mg total) by mouth at bedtime. 11/09/17  Yes Dettinger, Fransisca Kaufmann, MD  digoxin (LANOXIN) 0.125 MG tablet Take 1 tablet (0.125 mg total) by mouth daily. Patient taking differently: Take 0.125 mg by mouth every morning.  12/21/17  Yes Minus Breeding, MD  docusate sodium (COLACE) 100 MG capsule Take 100 mg by mouth at bedtime.   Yes [provider]  donepezil (ARICEPT) 5 MG tablet Take 1 tablet (5 mg total) by mouth at bedtime. FOR MEMORY 10/18/17  Yes Dettinger, Fransisca Kaufmann, MD  flecainide (TAMBOCOR) 100 MG tablet Take 0.5 tablets (50 mg total) by mouth 2 (two) times daily. Take 1/2 tablet 50 mg by mouth twice daily 03/01/18  Yes Dettinger, Fransisca Kaufmann, MD  levETIRAcetam (KEPPRA) 500 MG tablet Take 1 tablet (500 mg total) by mouth 2 (two) times daily for 30 days. 02/22/18 03/24/18 Yes Dettinger, Fransisca Kaufmann, MD  levothyroxine (SYNTHROID, LEVOTHROID) 50 MCG tablet Take 1 tablet daily before breakfast Patient taking  differently: Take 50 mcg by mouth daily before breakfast.  11/09/17  Yes Dettinger, Fransisca Kaufmann, MD  metoprolol succinate (TOPROL-XL) 100 MG 24 hr tablet Take 1 tablet (100 mg total) by mouth daily. Take with or immediately following a meal. Patient taking differently: Take 100 mg by mouth every morning.  11/09/17  Yes Dettinger, Fransisca Kaufmann, MD  mirtazapine (REMERON) 15 MG tablet Take 1 tablet (15 mg total) by mouth at bedtime.  11/09/17  Yes Dettinger, Fransisca Kaufmann, MD  Multiple Vitamin (MULTIVITAMIN WITH MINERALS) TABS tablet Take 1 tablet by mouth every morning.   Yes [provider]  pantoprazole (PROTONIX) 40 MG tablet Take 1 tablet (40 mg total) by mouth daily. Patient taking differently: Take 40 mg by mouth every morning.  11/09/17  Yes Dettinger, Fransisca Kaufmann, MD  Peppermint Oil (IBGARD PO) Take 1 tablet by mouth daily as needed (stomach pain).   Yes [provider]    Scheduled Meds: . mouth rinse  15 mL Mouth Rinse BID   Continuous Infusions: . ceFEPime (MAXIPIME) IV    . levETIRAcetam 500 mg (03/02/18 1651)  . vancomycin     PRN Meds:.acetaminophen, metoprolol tartrate, ondansetron     Results for orders placed or performed during the hospital encounter of 03/01/18 (from the past 48 hour(s))  CBC     Status: None   Collection Time: 03/01/18  1:53 PM  Result Value Ref Range   WBC 7.8 4.0 - 10.5 K/uL   RBC 4.33 3.87 - 5.11 MIL/uL   Hemoglobin 12.8 12.0 - 15.0 g/dL   HCT 40.5 36.0 - 46.0 %   MCV 93.5 80.0 - 100.0 fL   MCH 29.6 26.0 - 34.0 pg   MCHC 31.6 30.0 - 36.0 g/dL   RDW 15.5 11.5 - 15.5 %   Platelets 271 150 - 400 K/uL   nRBC 0.0 0.0 - 0.2 %    Comment: Performed at [facility], [address] [place], [place] [address]  Differential     Status: None   Collection Time: 03/01/18  1:53 PM  Result Value Ref Range   Neutrophils Relative % 63 %   Neutro Abs 4.9 1.7 - 7.7 K/uL   Lymphocytes Relative 23 %   Lymphs Abs 1.8 0.7 - 4.0 K/uL   Monocytes Relative 12 %    Monocytes Absolute 0.9 0.1 - 1.0 K/uL   Eosinophils Relative 1 %   Eosinophils Absolute 0.1 0.0 - 0.5 K/uL   Basophils Relative 1 %   Basophils Absolute 0.0 0.0 - 0.1 K/uL   Immature Granulocytes 0 %   Abs Immature Granulocytes 0.03 0.00 - 0.07 K/uL    Comment: Performed at [facility], [address], [place], [place] [address]  Urine rapid drug screen (hosp performed)     Status: None   Collection Time: 03/01/18  1:53 PM  Result Value Ref Range   Opiates NONE DETECTED NONE DETECTED   Cocaine NONE DETECTED NONE DETECTED   Benzodiazepines NONE DETECTED NONE DETECTED   Amphetamines NONE DETECTED NONE DETECTED   Tetrahydrocannabinol NONE DETECTED NONE DETECTED   Barbiturates NONE DETECTED NONE DETECTED    Comment: (NOTE) DRUG SCREEN FOR MEDICAL PURPOSES ONLY.  IF CONFIRMATION IS NEEDED FOR ANY PURPOSE, NOTIFY LAB WITHIN 5 DAYS. LOWEST DETECTABLE LIMITS FOR URINE DRUG SCREEN Drug Class                     Cutoff (ng/mL) Amphetamine and metabolites    1000 Barbiturate and metabolites    200 Benzodiazepine                 761 Tricyclics and metabolites     300 Opiates and metabolites        300 Cocaine and metabolites        300 THC  50 Performed at [facility], [address] [place], [place] [address]   Urinalysis, Routine w reflex microscopic     Status: Abnormal   Collection Time: 03/01/18  1:53 PM  Result Value Ref Range   Color, Urine STRAW (A) YELLOW   APPearance CLEAR CLEAR   Specific Gravity, Urine 1.026 1.005 - 1.030   pH 8.0 5.0 - 8.0   Glucose, UA NEGATIVE NEGATIVE mg/dL   Hgb urine dipstick NEGATIVE NEGATIVE   Bilirubin Urine NEGATIVE NEGATIVE   Ketones, ur NEGATIVE NEGATIVE mg/dL   Protein, ur NEGATIVE NEGATIVE mg/dL   Nitrite NEGATIVE NEGATIVE   Leukocytes,Ua NEGATIVE NEGATIVE   RBC / HPF 0-5 0 - 5 RBC/hpf   Bacteria, UA NONE SEEN NONE SEEN    Comment: Performed at [facility], [address] [place], [place] [address]  CBG  monitoring, ED     Status: Abnormal   Collection Time: 03/01/18  2:03 PM  Result Value Ref Range   Glucose-Capillary 167 (H) 70 - 99 mg/dL  Ethanol     Status: None   Collection Time: 03/01/18  2:10 PM  Result Value Ref Range   Alcohol, Ethyl (B) <10 <10 mg/dL    Comment: (NOTE) Lowest detectable limit for serum alcohol is 10 mg/dL. For medical purposes only. Performed at [facility], [address] [place], [place] [address]   Protime-INR     Status: None   Collection Time: 03/01/18  2:10 PM  Result Value Ref Range   Prothrombin Time 13.7 11.4 - 15.2 seconds   INR 1.1 0.8 - 1.2    Comment: (NOTE) INR goal varies based on device and disease states. Performed at [facility], [address], [place], [place] [address]   APTT     Status: None   Collection Time: 03/01/18  2:10 PM  Result Value Ref Range   aPTT 35 24 - 36 seconds    Comment: Performed at [facility], [address] [place], [place] [address]  Comprehensive metabolic panel     Status: Abnormal   Collection Time: 03/01/18  2:10 PM  Result Value Ref Range   Sodium 133 (L) 135 - 145 mmol/L   Potassium 3.8 3.5 - 5.1 mmol/L   Chloride 99 98 - 111 mmol/L   CO2 25 22 - 32 mmol/L   Glucose, Bld 161 (H) 70 - 99 mg/dL   BUN 17 8 - 23 mg/dL   Creatinine, Ser 0.54 0.44 - 1.00 mg/dL   Calcium 8.6 (L) 8.9 - 10.3 mg/dL   Total Protein 6.7 6.5 - 8.1 g/dL   Albumin 3.7 3.5 - 5.0 g/dL   AST 22 15 - 41 U/L   ALT 15 0 - 44 U/L   Alkaline Phosphatase 64 38 - 126 U/L   Total Bilirubin 0.5 0.3 - 1.2 mg/dL   GFR calc non Af Amer >60 >60 mL/min   GFR calc Af Amer >60 >60 mL/min   Anion gap 9 5 - 15    Comment: Performed at [facility], [address], [place], [place] [address]  Digoxin level     Status: None   Collection Time: 03/01/18  2:10 PM  Result Value Ref Range   Digoxin Level 0.9 0.8 - 2.0 ng/mL    Comment: Performed at [facility], [address], [place], [place] [address]  Culture, blood (routine x 2)     Status:  None (Preliminary result)   Collection Time: 03/01/18  8:56 PM  Result Value Ref  Range   Specimen Description BLOOD RIGHT ARM    Special Requests      BOTTLES DRAWN AEROBIC AND ANAEROBIC Blood Culture adequate volume   Culture      NO GROWTH < 12 HOURS Performed at [facility], [address] [place], [place] [address]    Report Status PENDING   Culture, blood (routine x 2)     Status: None (Preliminary result)   Collection Time: 03/01/18  8:58 PM  Result Value Ref Range   Specimen Description BLOOD RIGHT HAND    Special Requests      BOTTLES DRAWN AEROBIC AND ANAEROBIC Blood Culture adequate volume   Culture      NO GROWTH < 12 HOURS Performed at [facility], [address] [place], [place] [address]    Report Status PENDING   Lactic acid, plasma     Status: Abnormal   Collection Time: 03/01/18  8:58 PM  Result Value Ref Range   Lactic Acid, Venous 2.0 (HH) 0.5 - 1.9 mmol/L    Comment: CRITICAL RESULT CALLED TO, READ BACK BY AND VERIFIED WITH: RHEW,J ON 03/01/18 AT 2155 BY LOY,C Performed at [facility], [address] [place], [place] [address]   Influenza panel by PCR (type A & B)     Status: None   Collection Time: 03/01/18  9:00 PM  Result Value Ref Range   Influenza A By PCR NEGATIVE NEGATIVE   Influenza B By PCR NEGATIVE NEGATIVE    Comment: (NOTE) The Xpert Xpress Flu assay is intended as an aid in the diagnosis of  influenza and should not be used as a sole basis for treatment.  This  assay is FDA approved for nasopharyngeal swab specimens only. Nasal  washings and aspirates are unacceptable for Xpert Xpress Flu testing. Performed at [facility], [address] [place], [place] [address]   Lactic acid, plasma     Status: None   Collection Time: 03/02/18 12:56 AM  Result Value Ref Range   Lactic Acid, Venous 1.1 0.5 - 1.9 mmol/L    Comment: Performed at [facility], [address] [place], [place] [address]  Troponin I - Now Then Q6H     Status: Abnormal    Collection Time: 03/02/18  2:44 AM  Result Value Ref Range   Troponin I 0.05 (HH) <0.03 ng/mL    Comment: CRITICAL RESULT CALLED TO, READ BACK BY AND VERIFIED WITH: J RHEW,RN @0355  03/02/18 [facility] Performed at [facility], [address] [place], [place] [address]   Magnesium     Status: None   Collection Time: 03/02/18  2:44 AM  Result Value Ref Range   Magnesium 1.9 1.7 - 2.4 mg/dL    Comment: Performed at [facility], [address] [place], [place] [address]  Troponin I - Now Then Q6H     Status: Abnormal   Collection Time: 03/02/18  8:42 AM  Result Value Ref Range   Troponin I 0.04 (HH) <0.03 ng/mL    Comment: CRITICAL VALUE NOTED.  VALUE IS CONSISTENT WITH PREVIOUSLY REPORTED AND CALLED VALUE. Performed at [facility], [address], [place], [place] [address]   Basic metabolic panel     Status: Abnormal   Collection Time: 03/02/18  8:42 AM  Result Value Ref Range   Sodium 134 (L) 135 - 145 mmol/L   Potassium 3.8 3.5 - 5.1 mmol/L   Chloride 98 98 - 111 mmol/L   CO2 27 22 - 32 mmol/L   Glucose,  Bld 96 70 - 99 mg/dL   BUN 11 8 - 23 mg/dL   Creatinine, Ser 0.49 0.44 - 1.00 mg/dL   Calcium 8.4 (L) 8.9 - 10.3 mg/dL   GFR calc non Af Amer >60 >60 mL/min   GFR calc Af Amer >60 >60 mL/min   Anion gap 9 5 - 15    Comment: Performed at [facility], [address], [place], [place] [address]  CBC     Status: None   Collection Time: 03/02/18  8:42 AM  Result Value Ref Range   WBC 8.4 4.0 - 10.5 K/uL   RBC 4.24 3.87 - 5.11 MIL/uL   Hemoglobin 13.0 12.0 - 15.0 g/dL   HCT 40.5 36.0 - 46.0 %   MCV 95.5 80.0 - 100.0 fL   MCH 30.7 26.0 - 34.0 pg   MCHC 32.1 30.0 - 36.0 g/dL   RDW 15.4 11.5 - 15.5 %   Platelets 256 150 - 400 K/uL   nRBC 0.0 0.0 - 0.2 %    Comment: Performed at [facility], [address], [place], [place] [address]  Troponin I - Now Then Q6H     Status: None   Collection Time: 03/02/18  2:58 PM  Result Value Ref Range   Troponin I <0.03 <0.03 ng/mL     Comment: Performed at [facility], [address] [place], [place] [address]    Studies/Results:  EEG This is an abnormal electroencephalogram secondary to right hemispheric slowing.  This suggests a focal disturbance, etiology nonspecific.  BRAIN MRI FINDINGS: Moderately motion degraded examination.  INTRACRANIAL CONTENTS: No reduced diffusion to suggest acute ischemia. No advanced parenchymal brain volume loss for age. Confluent supratentorial and patchy pontine white matter FLAIR T2 hyperintensities. Old small bilateral cerebellar infarcts. No midline shift, mass effect or definite masses. No abnormal extra-axial fluid collections. 5 mm pineal cyst.  VASCULAR: Normal major intracranial vascular flow voids present at skull base.  SKULL AND UPPER CERVICAL SPINE: No abnormal sellar expansion. No suspicious calvarial bone marrow signal. Craniocervical junction maintained.  SINUSES/ORBITS: The mastoid air-cells and included paranasal sinuses are well-aerated.The included ocular globes and orbital contents are non-suspicious. Status post bilateral ocular lens implants.  OTHER: Patient is edentulous.  IMPRESSION: 1. Limited 4 sequence moderately motion degraded MRI head: No acute intracranial process. 2. Severe chronic small vessel ischemic changes and old small cerebellar infarcts.    CTA HEAD NECK IMPRESSION: 1. Mild atherosclerosis in the head and neck without evidence of large vessel occlusion or significant stenosis. 2. Motion degraded perfusion imaging of questionable reliability as detailed above. No core infarct identified.    BRAIN MRI IS REVIEWED IN PERSON.  Only limited sequences are carried out.  No increased signal is seen on DWI.  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    Guerry Covington A. [name], M.D.  Diplomate, Tax adviser of Psychiatry and Neurology ( Neurology). 03/02/2018, 7:45 PM

## 2018-03-02 NOTE — Progress Notes (Signed)
CODE STROKE CT TIMES 1327 CALL THAT EMS WAS 20 MINUTES OUT 1344 BEEPER 1351 EXAM STARTED 1353 EXAM FINISHED 1353 IMAGES SENT TO [facility] [place] [facility] [place]

## 2018-03-02 NOTE — Progress Notes (Signed)
Patient responding to voice and opening her eyes when asked. Patient stated that she was having chest pain and pointed to her mid chest. Unable to describe pain. Vital signs are stable. EKG obtained. Dr. Olevia Bowens notified and on floor to assess patient. New orders placed.

## 2018-03-02 NOTE — Care Management Obs Status (Signed)
[facility] NOTIFICATION   Patient Details  Name: [name] MRN: [id number] Date of Birth: Jul 04, 1932   Medicare Observation Status Notification Given:  Yes    [name], [name], RN 03/02/2018, 8:05 AM

## 2018-03-02 NOTE — Progress Notes (Signed)
Night shift telemetry coverage note.  The patient was seen due to complaining of precordial central chest pain.  It was pleuritic and reproducible.Her chart was reviewed.  Most recent vital signs are normal.  Patient is currently very somnolent due to receiving Keppra and Lorazepam earlier.  An EKG was done, which was similar to the one done earlier, but the rate was normal and not tachycardic on this most recent one.  Troponin level x3 6 hours apart ordered.  Toradol 50 mg IVP x1 dose ordered.  Continue close monitoring.  Tennis Must, MD

## 2018-03-02 NOTE — Evaluation (Signed)
Clinical/Bedside Swallow Evaluation Patient Details  Name: [name] MRN: [id number] Date of Birth: [birth year]  Today's Date: 03/02/2018 Time: SLP Start Time (ACUTE ONLY): 1602 SLP Stop Time (ACUTE ONLY): 1625 SLP Time Calculation (min) (ACUTE ONLY): 23 min  Past Medical History:  Past Medical History:  Diagnosis Date  . Anemia   . Atrial fibrillation (HCC)    Paroxysmal, Flecainide therapy  . Calf pain    September, 2012, at rest  . Carotid bruit    Doppler, December, 2009, no abnormality  . Cataract   . Diverticulosis   . GERD (gastroesophageal reflux disease)   . History of shingles   . HLD (hyperlipidemia)   . HTN (hypertension)   . Hypothyroidism   . Insomnia   . Osteoarthritis   . PONV (postoperative nausea and vomiting)   . Rectal bleeding 2001   diverticulosis and int hemorrhoids on 07/1999 and 02/2010 colonoscopies.  . Seizures ([place])   . Stroke ([place])   . Warfarin anticoagulation    Past Surgical History:  Past Surgical History:  Procedure Laterality Date  . BIOPSY  09/29/2015   Procedure: BIOPSY;  Surgeon: Rogene Houston, MD;  Location: [facility];  Service: Endoscopy;;  gastric  . CATARACT EXTRACTION W/PHACO Right 04/15/2014   Procedure: CATARACT EXTRACTION PHACO AND INTRAOCULAR LENS PLACEMENT (IOC);  Surgeon: Tonny Branch, MD;  Location: [facility];  Service: Ophthalmology;  Laterality: Right;  CDE: 13.30  . CATARACT EXTRACTION W/PHACO Left 05/13/2014   Procedure: CATARACT EXTRACTION PHACO AND INTRAOCULAR LENS PLACEMENT LEFT EYE;  Surgeon: Tonny Branch, MD;  Location: [facility];  Service: Ophthalmology;  Laterality: Left;  CDE:13.00  . CHOLECYSTECTOMY N/A 01/15/2015   Procedure: LAPAROSCOPIC CHOLECYSTECTOMY;  Surgeon: Ralene Ok, MD;  Location: [facility];  Service: General;  Laterality: N/A;  . COLONOSCOPY N/A 09/29/2015   Procedure: COLONOSCOPY;  Surgeon: Rogene Houston, MD;  [facility]: [facility];  Service: Endoscopy;  Laterality: N/A;  . COLONOSCOPY    .  ESOPHAGOGASTRODUODENOSCOPY N/A 01/13/2015   Procedure: ESOPHAGOGASTRODUODENOSCOPY (EGD);  Surgeon: Jerene Bears, MD;  Location: [facility];  Service: Endoscopy;  Laterality: N/A;  . ESOPHAGOGASTRODUODENOSCOPY N/A 09/29/2015   Procedure: ESOPHAGOGASTRODUODENOSCOPY (EGD);  Surgeon: Rogene Houston, MD;  [facility]: [facility];  Service: Endoscopy;  Laterality: N/A;  12:15  . GALLBLADDER SURGERY    . LEFT HEART CATH AND CORONARY ANGIOGRAPHY N/A 06/22/2016   Procedure: Left Heart Cath and Coronary Angiography;  Surgeon: Troy Sine, MD;  Location: [facility];  Service: Cardiovascular;  Laterality: N/A;  . SHOULDER OPEN ROTATOR CUFF REPAIR     bilateral  . TOTAL KNEE ARTHROPLASTY  05/26/10   right  . TOTAL KNEE ARTHROPLASTY  11/17/2010   Procedure: TOTAL KNEE ARTHROPLASTY;  Surgeon: Mauri Pole;  Location: [facility];  Service: Orthopedics;  Laterality: Left;  . TOTAL KNEE ARTHROPLASTY     Right  . TOTAL SHOULDER REPLACEMENT  01/2010   left   HPI:  [name] is a 85 y.o. female with medical history significant for paroxysmal A. Fib/atrial flutter, diastolic CHF, HTN, CVA, dementia, who presented to the ED after reports of initial confusion earlier today, patient's aide was with her, and reports that it appeared patient did not know how to use her walker, kept walking into the walls with a walker, her speech did not make sense also, this began at about 1030 today.  It was also noted that she was not moving her left side-about 1:30 PM.  Patient was also still confused.  Patients niece reports significant jecking of all patient's extremities, she is adamant and sure it was not tremors, reports her husband has Parkinson's and she knows the difference. BSE requested   Assessment / Plan / Recommendation Clinical Impression  Clinical swallow evaluation completed at bedside with sister in law present. Pt's participation extremely limited by lethargy. Her sister in law reports that she feels it  is due to possible seizure and medication effect. Pt able to follow simple commands when alert, however she drifts in and out of alertness. She appears to have mild left facial asymmetry and head gaze to right preference. Pt swallowed a few sips of water via straw without overt signs or symptoms of aspiration, however additional trials were limited by lethargy. Pt unable to sustain alertness sufficient for safe po intake at this time. Recommend continue NPO until sufficiently alert and RN could offer small sips of water after oral care. SLP can check Pt tomorrow.  SLP Visit Diagnosis: Dysphagia, unspecified (R13.10)    Aspiration Risk  Mild aspiration risk    Diet Recommendation NPO;Free water protocol after oral care   Liquid Administration via: Cup;Straw Medication Administration: Via alternative means Supervision: Staff to assist with self feeding;Full supervision/cueing for compensatory strategies Compensations: Slow rate;Small sips/bites Postural Changes: Seated upright at 90 degrees;Remain upright for at least 30 minutes after po intake    Other  Recommendations Oral Care Recommendations: Oral care prior to ice chip/H20;Staff/trained caregiver to provide oral care   Follow up Recommendations Skilled Nursing facility      Frequency and Duration min 2x/week  1 week       Prognosis Prognosis for Safe Diet Advancement: Fair Barriers to Reach Goals: Medication Barriers/Prognosis Comment: Pt with increased seizure medication possibly leading to lethargy today      Swallow Study   General Date of Onset: 03/01/18 HPI: [name] is a 85 y.o. female with medical history significant for paroxysmal A. Fib/atrial flutter, diastolic CHF, HTN, CVA, dementia, who presented to the ED after reports of initial confusion earlier today, patient's aide was with her, and reports that it appeared patient did not know how to use her walker, kept walking into the walls with a walker, her speech did  not make sense also, this began at about 1030 today.  It was also noted that she was not moving her left side-about 1:30 PM.  Patient was also still confused.  Patients niece reports significant jecking of all patient's extremities, she is adamant and sure it was not tremors, reports her husband has Parkinson's and she knows the difference. BSE requested Type of Study: Bedside Swallow Evaluation Previous Swallow Assessment: None on record, SLE only Diet Prior to this Study: NPO Temperature Spikes Noted: No Respiratory Status: Room air History of Recent Intubation: No Behavior/Cognition: Lethargic/Drowsy;Requires cueing Oral Cavity Assessment: Within Functional Limits Oral Care Completed by SLP: Yes Oral Cavity - Dentition: Dentures, bottom;Dentures, top Vision: Functional for self-feeding Self-Feeding Abilities: Total assist Patient Positioning: Upright in bed Baseline Vocal Quality: Normal Volitional Cough: Congested;Weak Volitional Swallow: Able to elicit    Oral/Motor/Sensory Function Overall Oral Motor/Sensory Function: (Pt appears to have mild left facial asymmetry)   Ice Chips Ice chips: Impaired Presentation: Spoon Oral Phase Impairments: Reduced lingual movement/coordination Oral Phase Functional Implications: Oral holding   Thin Liquid Thin Liquid: Within functional limits Presentation: Cup;Straw    Nectar Thick Nectar Thick Liquid: Not tested   Honey Thick Honey Thick Liquid: Not tested  Puree Puree: Impaired Presentation: Spoon Oral Phase Impairments: Reduced lingual movement/coordination;Poor awareness of bolus Oral Phase Functional Implications: Oral holding;Prolonged oral transit   Solid     Solid: Impaired Presentation: Self Fed Oral Phase Impairments: Reduced lingual movement/coordination;Poor awareness of bolus;Impaired mastication Oral Phase Functional Implications: Oral residue;Impaired mastication     Thank you,  Genene Churn,  [place]  [name] 03/02/2018,4:32 PM

## 2018-03-02 NOTE — Care Management (Signed)
Patient is active with [facility] for RN, PT and OT services.

## 2018-03-02 NOTE — Progress Notes (Addendum)
PROGRESS NOTE    [name]  TZG:[id number] DOB: 1932-02-27 DOA: 03/01/2018 PCP: Dettinger, Fransisca Kaufmann, MD    Brief Narrative:  85 year old female with a history of paroxysmal atrial fib, diastolic heart failure, dementia, hypertension, brought to the hospital with left-sided weakness, confusion, concern for seizure activity.  She received intravenous Keppra and Ativan in the emergency room.  CT imaging of the brain did not show any new findings.  She remains lethargic.  She had a mild fever shortly after admission.  She does not have any significant leukocytosis or clear signs of infection.  She is not had any recurrence of fever.  Neurology consult has been requested.   Assessment & Plan:   Active Problems:   Hypothyroidism   Paroxysmal atrial fibrillation (HCC)   HTN (hypertension)   Left-sided weakness   Seizure (HCC)   Acute encephalopathy   1. Acute encephalopathy.  Unclear etiology, seizure versus TIA.  The patient has been taking Keppra since her last admission.  Family reports that this is her fifth episode where she is developed unilateral weakness.  She had a similar presentation last time she was admitted at [facility] where MRI was unrevealing.  At that time she was started on Keppra for possible seizures.  She has received Ativan and may be drowsy from [place] at this time.  She does not have any obvious infections at this time.  Continue to monitor.  Neurology has been consulted.  She had CT had an angiogram of head and neck done in the emergency room that did not show any new findings.  Will defer need for repeat MRI to neurology. 2. Seizures.  Family reports that she had a jerking spell prior to admission.  They did notice that she had left-sided weakness.  Patient underwent EEG today.  She is currently on intravenous Keppra.  Monitor for any recurrence. 3. Paroxysmal atrial fibrillation.  She is chronically on digoxin, flecainide, metoprolol.  These are currently on hold  while she is n.p.o.  She is also on Eliquis which is on hold until mental status improves. 4. Hypothyroidism.  Continue Synthroid 5. Hypertension.  Continue to monitor off of beta-blockers.  He is beta-blockers PRN. 6. Mild dementia.  She is on Aricept as an outpatient. 7. Fever.  Unclear etiology.  Work-up thus far has been unrevealing.  No leukocytosis.  Chest x-ray did not show pneumonia.  Urinalysis and influenza panel are negative.  She does not have any neck stiffness.  She is empirically started on broad-spectrum antibiotics.  Cultures have been sent.  If no fever over the next 24 hours, can consider discontinuing antibiotics.   DVT prophylaxis: SCDs Code Status: Limited code, no intubation, no CPR Family Communication: Discussed with niece at bedside and answered all her questions to the best of my abilities Disposition Plan: Pending hospital course, discharge home once improved   Consultants:   Neurology  Procedures:   EEG: This is an abnormal electroencephalogram secondary to right hemispheric slowing.  This suggests a focal disturbance, etiology nonspecific.   Antimicrobials:   Vancomycin 2/26 >  Cefepime 2/26 >   Subjective: Patient is somnolent, does not answer any questions.  Objective: Vitals:   03/02/18 0337 03/02/18 0537 03/02/18 0737 03/02/18 1537  BP: (!) 121/46 (!) 160/62 140/74 (!) 150/88  Pulse: 74 61 65 88  Resp: 17 18 16 18   Temp: 98.3 F (36.8 C) 98.7 F (37.1 C) 99.7 F (37.6 C) 99.6 F (37.6 C)  TempSrc: Oral Oral  Oral Oral  SpO2: 98% 97% 96% 94%  Weight:      Height:        Intake/Output Summary (Last 24 hours) at 03/02/2018 1727 Last data filed at 03/02/2018 0600 Gross per 24 hour  Intake 635.53 ml  Output -  Net 635.53 ml   Filed Weights   03/01/18 1358 03/01/18 1937  Weight: 59.5 kg 57.2 kg    Examination:  General exam: Somnolent, briefly opens eyes to voice Respiratory system: Clear to auscultation. Respiratory effort  normal. Cardiovascular system: S1 & S2 heard, RRR. No JVD, murmurs, rubs, gallops or clicks.  Gastrointestinal system: Abdomen is nondistended, soft and nontender. No organomegaly or masses felt. Normal bowel sounds heard. Central nervous system: Lethargic no focal neurological deficits. Extremities: No edema bilaterally Skin: No rashes, lesions or ulcers Psychiatry: Somnolent.  Agitated at times.  Data Reviewed: I have personally reviewed following labs and imaging studies  CBC: Recent Labs  Lab 03/01/18 1353 03/02/18 0842  WBC 7.8 8.4  NEUTROABS 4.9  --   HGB 12.8 13.0  HCT 40.5 40.5  MCV 93.5 95.5  PLT 271 332   Basic Metabolic Panel: Recent Labs  Lab 03/01/18 1410 03/02/18 0244 03/02/18 0842  NA 133*  --  134*  K 3.8  --  3.8  CL 99  --  98  CO2 25  --  27  GLUCOSE 161*  --  96  BUN 17  --  11  CREATININE 0.54  --  0.49  CALCIUM 8.6*  --  8.4*  MG  --  1.9  --    GFR: Estimated Creatinine Clearance: 42.1 mL/min (by C-G formula based on SCr of 0.49 mg/dL). Liver Function Tests: Recent Labs  Lab 03/01/18 1410  AST 22  ALT 15  ALKPHOS 64  BILITOT 0.5  PROT 6.7  ALBUMIN 3.7   No results for input(s): LIPASE, AMYLASE in the last 168 hours. No results for input(s): AMMONIA in the last 168 hours. Coagulation Profile: Recent Labs  Lab 03/01/18 1410  INR 1.1   Cardiac Enzymes: Recent Labs  Lab 03/02/18 0244 03/02/18 0842 03/02/18 1458  TROPONINI 0.05* 0.04* <0.03   BNP (last 3 results) No results for input(s): PROBNP in the last 8760 hours. HbA1C: No results for input(s): HGBA1C in the last 72 hours. CBG: Recent Labs  Lab 03/01/18 1403  GLUCAP 167*   Lipid Profile: No results for input(s): CHOL, HDL, LDLCALC, TRIG, CHOLHDL, LDLDIRECT in the last 72 hours. Thyroid Function Tests: No results for input(s): TSH, T4TOTAL, FREET4, T3FREE, THYROIDAB in the last 72 hours. Anemia Panel: No results for input(s): VITAMINB12, FOLATE, FERRITIN, TIBC,  IRON, RETICCTPCT in the last 72 hours. Sepsis Labs: Recent Labs  Lab 03/01/18 2058 03/02/18 0056  LATICACIDVEN 2.0* 1.1    Recent Results (from the past 240 hour(s))  Culture, blood (routine x 2)     Status: None (Preliminary result)   Collection Time: 03/01/18  8:56 PM  Result Value Ref Range Status   Specimen Description BLOOD RIGHT ARM  Final   Special Requests   Final    BOTTLES DRAWN AEROBIC AND ANAEROBIC Blood Culture adequate volume   Culture   Final    NO GROWTH < 12 HOURS Performed at [facility], [address] [place], [place] [address]    Report Status PENDING  Incomplete  Culture, blood (routine x 2)     Status: None (Preliminary result)   Collection Time: 03/01/18  8:58 PM  Result Value  Ref Range Status   Specimen Description BLOOD RIGHT HAND  Final   Special Requests   Final    BOTTLES DRAWN AEROBIC AND ANAEROBIC Blood Culture adequate volume   Culture   Final    NO GROWTH < 12 HOURS Performed at [facility], [address] [place], [place] [address]    Report Status PENDING  Incomplete         Radiology Studies: Ct Angio Head W Or Wo Contrast  Result Date: 03/01/2018 CLINICAL DATA:  Left-sided weakness.  Confusion.  Unsteady gait. EXAM: CT ANGIOGRAPHY HEAD AND NECK CT PERFUSION BRAIN TECHNIQUE: Multidetector CT imaging of the head and neck was performed using the standard protocol during bolus administration of intravenous contrast. Multiplanar CT image reconstructions and MIPs were obtained to evaluate the vascular anatomy. Carotid stenosis measurements (when applicable) are obtained utilizing NASCET criteria, using the distal internal carotid diameter as the denominator. Multiphase CT imaging of the brain was performed following IV bolus contrast injection. Subsequent parametric perfusion maps were calculated using RAPID software. CONTRAST:  115mL ISOVUE-370 IOPAMIDOL (ISOVUE-370) INJECTION 76% COMPARISON:  Head and neck CTA 01/30/2018. FINDINGS: CTA NECK  FINDINGS Aortic arch: Standard 3 vessel aortic arch with mild atherosclerotic plaque. Widely patent arch vessel origins. Right carotid system: Patent with mild atherosclerotic plaque at the carotid bifurcation. No evidence of significant stenosis or dissection. Tortuous mid cervical ICA with kinked appearance. Left carotid system: Patent without evidence of significant stenosis or dissection. Tortuous mid cervical ICA with kinked appearance. Vertebral arteries: Patent and codominant without evidence of significant stenosis or dissection although motion artifact limits assessment of the proximal left V1 segment (this was widely patent on the prior CTA however). Skeleton: Moderate cervical disc degeneration with neural foraminal stenosis due to uncovertebral spurring most notable on the left at C4-5 and bilaterally at C5-6. Other neck: No mass or enlarged lymph nodes identified. Upper chest: Clear lung apices. Review of the MIP images confirms the above findings CTA HEAD FINDINGS Anterior circulation: The internal carotid arteries are widely patent from skull base to carotid termini with mild atherosclerotic plaque noted. A prominent right anterior choroidal artery is incidentally noted. ACAs and MCAs are patent without evidence of proximal branch occlusion or significant proximal stenosis. No aneurysm is identified. Posterior circulation: The intracranial vertebral arteries are widely patent to the basilar. Patent PICA and SCA origins are visualized bilaterally. The basilar artery is widely patent. Posterior communicating arteries are diminutive or absent. PCAs are patent without evidence of significant stenosis. No aneurysm is identified. Venous sinuses: Patent. Anatomic variants: None. Review of the MIP images confirms the above findings CT Brain Perfusion Findings: CBF (<30%) Volume: 55mL Perfusion (Tmax>6.0s) volume: 66mL Mismatch Volume: 43mL Infarction Location:No core infarct identified by RAPID. The Tmax  volume is present in the right cerebral hemisphere primarily in the MCA territory, however perfusion imaging is significantly motion degraded and multiple data points were discarded casting doubt on the reliability of this data. IMPRESSION: 1. Mild atherosclerosis in the head and neck without evidence of large vessel occlusion or significant stenosis. 2. Motion degraded perfusion imaging of questionable reliability as detailed above. No core infarct identified. These results were called by telephone at the time of interpretation on 03/01/2018 at 3:45 pm to Dr. Dayna Barker, who verbally acknowledged these results. Electronically Signed   By: Logan Bores M.D.   On: 03/01/2018 16:03   Ct Angio Neck W Or Wo Contrast  Result Date: 03/01/2018 CLINICAL DATA:  Left-sided weakness.  Confusion.  Unsteady gait. EXAM: CT ANGIOGRAPHY HEAD AND NECK CT PERFUSION BRAIN TECHNIQUE: Multidetector CT imaging of the head and neck was performed using the standard protocol during bolus administration of intravenous contrast. Multiplanar CT image reconstructions and MIPs were obtained to evaluate the vascular anatomy. Carotid stenosis measurements (when applicable) are obtained utilizing NASCET criteria, using the distal internal carotid diameter as the denominator. Multiphase CT imaging of the brain was performed following IV bolus contrast injection. Subsequent parametric perfusion maps were calculated using RAPID software. CONTRAST:  139mL ISOVUE-370 IOPAMIDOL (ISOVUE-370) INJECTION 76% COMPARISON:  Head and neck CTA 01/30/2018. FINDINGS: CTA NECK FINDINGS Aortic arch: Standard 3 vessel aortic arch with mild atherosclerotic plaque. Widely patent arch vessel origins. Right carotid system: Patent with mild atherosclerotic plaque at the carotid bifurcation. No evidence of significant stenosis or dissection. Tortuous mid cervical ICA with kinked appearance. Left carotid system: Patent without evidence of significant stenosis or dissection.  Tortuous mid cervical ICA with kinked appearance. Vertebral arteries: Patent and codominant without evidence of significant stenosis or dissection although motion artifact limits assessment of the proximal left V1 segment (this was widely patent on the prior CTA however). Skeleton: Moderate cervical disc degeneration with neural foraminal stenosis due to uncovertebral spurring most notable on the left at C4-5 and bilaterally at C5-6. Other neck: No mass or enlarged lymph nodes identified. Upper chest: Clear lung apices. Review of the MIP images confirms the above findings CTA HEAD FINDINGS Anterior circulation: The internal carotid arteries are widely patent from skull base to carotid termini with mild atherosclerotic plaque noted. A prominent right anterior choroidal artery is incidentally noted. ACAs and MCAs are patent without evidence of proximal branch occlusion or significant proximal stenosis. No aneurysm is identified. Posterior circulation: The intracranial vertebral arteries are widely patent to the basilar. Patent PICA and SCA origins are visualized bilaterally. The basilar artery is widely patent. Posterior communicating arteries are diminutive or absent. PCAs are patent without evidence of significant stenosis. No aneurysm is identified. Venous sinuses: Patent. Anatomic variants: None. Review of the MIP images confirms the above findings CT Brain Perfusion Findings: CBF (<30%) Volume: 70mL Perfusion (Tmax>6.0s) volume: 9mL Mismatch Volume: 69mL Infarction Location:No core infarct identified by RAPID. The Tmax volume is present in the right cerebral hemisphere primarily in the MCA territory, however perfusion imaging is significantly motion degraded and multiple data points were discarded casting doubt on the reliability of this data. IMPRESSION: 1. Mild atherosclerosis in the head and neck without evidence of large vessel occlusion or significant stenosis. 2. Motion degraded perfusion imaging of  questionable reliability as detailed above. No core infarct identified. These results were called by telephone at the time of interpretation on 03/01/2018 at 3:45 pm to Dr. Dayna Barker, who verbally acknowledged these results. Electronically Signed   By: Logan Bores M.D.   On: 03/01/2018 16:03   Ct Cerebral Perfusion W Contrast  Result Date: 03/01/2018 CLINICAL DATA:  Left-sided weakness.  Confusion.  Unsteady gait. EXAM: CT ANGIOGRAPHY HEAD AND NECK CT PERFUSION BRAIN TECHNIQUE: Multidetector CT imaging of the head and neck was performed using the standard protocol during bolus administration of intravenous contrast. Multiplanar CT image reconstructions and MIPs were obtained to evaluate the vascular anatomy. Carotid stenosis measurements (when applicable) are obtained utilizing NASCET criteria, using the distal internal carotid diameter as the denominator. Multiphase CT imaging of the brain was performed following IV bolus contrast injection. Subsequent parametric perfusion maps were calculated using RAPID software. CONTRAST:  14mL ISOVUE-370 IOPAMIDOL (ISOVUE-370) INJECTION 76% COMPARISON:  Head and neck CTA 01/30/2018. FINDINGS: CTA NECK FINDINGS Aortic arch: Standard 3 vessel aortic arch with mild atherosclerotic plaque. Widely patent arch vessel origins. Right carotid system: Patent with mild atherosclerotic plaque at the carotid bifurcation. No evidence of significant stenosis or dissection. Tortuous mid cervical ICA with kinked appearance. Left carotid system: Patent without evidence of significant stenosis or dissection. Tortuous mid cervical ICA with kinked appearance. Vertebral arteries: Patent and codominant without evidence of significant stenosis or dissection although motion artifact limits assessment of the proximal left V1 segment (this was widely patent on the prior CTA however). Skeleton: Moderate cervical disc degeneration with neural foraminal stenosis due to uncovertebral spurring most notable  on the left at C4-5 and bilaterally at C5-6. Other neck: No mass or enlarged lymph nodes identified. Upper chest: Clear lung apices. Review of the MIP images confirms the above findings CTA HEAD FINDINGS Anterior circulation: The internal carotid arteries are widely patent from skull base to carotid termini with mild atherosclerotic plaque noted. A prominent right anterior choroidal artery is incidentally noted. ACAs and MCAs are patent without evidence of proximal branch occlusion or significant proximal stenosis. No aneurysm is identified. Posterior circulation: The intracranial vertebral arteries are widely patent to the basilar. Patent PICA and SCA origins are visualized bilaterally. The basilar artery is widely patent. Posterior communicating arteries are diminutive or absent. PCAs are patent without evidence of significant stenosis. No aneurysm is identified. Venous sinuses: Patent. Anatomic variants: None. Review of the MIP images confirms the above findings CT Brain Perfusion Findings: CBF (<30%) Volume: 21mL Perfusion (Tmax>6.0s) volume: 7mL Mismatch Volume: 39mL Infarction Location:No core infarct identified by RAPID. The Tmax volume is present in the right cerebral hemisphere primarily in the MCA territory, however perfusion imaging is significantly motion degraded and multiple data points were discarded casting doubt on the reliability of this data. IMPRESSION: 1. Mild atherosclerosis in the head and neck without evidence of large vessel occlusion or significant stenosis. 2. Motion degraded perfusion imaging of questionable reliability as detailed above. No core infarct identified. These results were called by telephone at the time of interpretation on 03/01/2018 at 3:45 pm to Dr. Dayna Barker, who verbally acknowledged these results. Electronically Signed   By: Logan Bores M.D.   On: 03/01/2018 16:03   Dg Chest Port 1 View  Result Date: 03/01/2018 CLINICAL DATA:  Fever EXAM: PORTABLE CHEST 1 VIEW  COMPARISON:  06/23/2017 FINDINGS: The heart size and mediastinal contours are within normal limits. Right basilar atelectasis. No pleural effusion or focal consolidation. Left total shoulder arthroplasty. The visualized skeletal structures are unremarkable. IMPRESSION: No active disease. Electronically Signed   By: Ulyses Jarred M.D.   On: 03/01/2018 23:20   Ct Head Code Stroke Wo Contrast  Result Date: 03/01/2018 CLINICAL DATA:  Code stroke. Left-sided weakness. Left facial droop. EXAM: CT HEAD WITHOUT CONTRAST TECHNIQUE: Contiguous axial images were obtained from the base of the skull through the vertex without intravenous contrast. COMPARISON:  MRI 01/30/2018, CT head 01/30/2018 FINDINGS: Brain: Negative for acute infarct.  Negative for hemorrhage or mass Generalized atrophy. Chronic microvascular ischemic changes in the white matter are moderately advanced since stable. Vascular: Negative for hyperdense vessel Skull: Negative Sinuses/Orbits: Paranasal sinuses clear. Bilateral cataract surgery. Other: None ASPECTS ([place] Stroke Program Early CT Score) - Ganglionic level infarction (caudate, lentiform nuclei, internal capsule, insula, M1-M3 cortex): 7 - Supraganglionic infarction (M4-M6 cortex): 3 Total score (0-10 with 10 being normal): 10 IMPRESSION: 1. No acute intracranial abnormality. 2. Atrophy and chronic  microvascular ischemic changes stable. 3. ASPECTS is 10 4. These results were called by telephone at the time of interpretation on 03/01/2018 at 2:03 pm to Dr. Thurnell Garbe, who verbally acknowledged these results. Electronically Signed   By: Franchot Gallo M.D.   On: 03/01/2018 14:04        Scheduled Meds: . mouth rinse  15 mL Mouth Rinse BID   Continuous Infusions: . ceFEPime (MAXIPIME) IV    . levETIRAcetam 500 mg (03/02/18 1651)  . vancomycin       LOS: 0 days    Time spent: 29mins    Kathie Dike, MD [facility]   If 7PM-7AM, please contact  night-coverage [email or web address]  03/02/2018, 5:27 PM    Addendum: 19:00:  Patient's son has arrived to the hospital and I have discussed the patient's care with him.  After discussing with his family, they are requesting that patient be transferred to [facility].  They feel it would be easier for them to see the patient since they live in [place].  They also report that most of her physicians are located in [place].  Patient remains lethargic at this time and is unable to take medications by mouth.  Since she is unable to take her oral rate control medications, she is developing rapid atrial fib.  She is receiving metoprolol IV and will start on diltiazem infusion.  Will request a progressive bed at [facility].  Discussed with Dr. Myna Hidalgo who is accepted the patient in transfer.  I have also informed Dr. Cheral Marker with neurology of the patient transfer  [facility]

## 2018-03-02 NOTE — Progress Notes (Signed)
CRITICAL VALUE ALERT  Critical Value: Troponin 0.05  Date & Time Notied:  03/02/18 0359  Provider Notified: Dr. Olevia Bowens  Orders Received/Actions taken: No new orders at this time

## 2018-03-03 ENCOUNTER — Observation Stay (HOSPITAL_COMMUNITY): Payer: Medicare Other

## 2018-03-03 ENCOUNTER — Inpatient Hospital Stay (HOSPITAL_COMMUNITY): Payer: Medicare Other

## 2018-03-03 DIAGNOSIS — Z7901 Long term (current) use of anticoagulants: Secondary | ICD-10-CM | POA: Diagnosis not present

## 2018-03-03 DIAGNOSIS — E876 Hypokalemia: Secondary | ICD-10-CM | POA: Diagnosis present

## 2018-03-03 DIAGNOSIS — K219 Gastro-esophageal reflux disease without esophagitis: Secondary | ICD-10-CM | POA: Diagnosis present

## 2018-03-03 DIAGNOSIS — R278 Other lack of coordination: Secondary | ICD-10-CM | POA: Diagnosis not present

## 2018-03-03 DIAGNOSIS — Z8673 Personal history of transient ischemic attack (TIA), and cerebral infarction without residual deficits: Secondary | ICD-10-CM | POA: Diagnosis not present

## 2018-03-03 DIAGNOSIS — R509 Fever, unspecified: Secondary | ICD-10-CM | POA: Diagnosis not present

## 2018-03-03 DIAGNOSIS — I482 Chronic atrial fibrillation, unspecified: Secondary | ICD-10-CM | POA: Diagnosis not present

## 2018-03-03 DIAGNOSIS — I4891 Unspecified atrial fibrillation: Secondary | ICD-10-CM | POA: Diagnosis not present

## 2018-03-03 DIAGNOSIS — I4819 Other persistent atrial fibrillation: Secondary | ICD-10-CM | POA: Diagnosis not present

## 2018-03-03 DIAGNOSIS — R2689 Other abnormalities of gait and mobility: Secondary | ICD-10-CM | POA: Diagnosis not present

## 2018-03-03 DIAGNOSIS — Z79899 Other long term (current) drug therapy: Secondary | ICD-10-CM | POA: Diagnosis not present

## 2018-03-03 DIAGNOSIS — I5032 Chronic diastolic (congestive) heart failure: Secondary | ICD-10-CM | POA: Diagnosis not present

## 2018-03-03 DIAGNOSIS — G40409 Other generalized epilepsy and epileptic syndromes, not intractable, without status epilepticus: Secondary | ICD-10-CM | POA: Diagnosis present

## 2018-03-03 DIAGNOSIS — R0902 Hypoxemia: Secondary | ICD-10-CM | POA: Diagnosis present

## 2018-03-03 DIAGNOSIS — I110 Hypertensive heart disease with heart failure: Secondary | ICD-10-CM | POA: Diagnosis not present

## 2018-03-03 DIAGNOSIS — E871 Hypo-osmolality and hyponatremia: Secondary | ICD-10-CM | POA: Diagnosis not present

## 2018-03-03 DIAGNOSIS — R531 Weakness: Secondary | ICD-10-CM | POA: Diagnosis not present

## 2018-03-03 DIAGNOSIS — G9340 Encephalopathy, unspecified: Secondary | ICD-10-CM | POA: Diagnosis not present

## 2018-03-03 DIAGNOSIS — M2550 Pain in unspecified joint: Secondary | ICD-10-CM | POA: Diagnosis not present

## 2018-03-03 DIAGNOSIS — Z79890 Hormone replacement therapy (postmenopausal): Secondary | ICD-10-CM | POA: Diagnosis not present

## 2018-03-03 DIAGNOSIS — N3281 Overactive bladder: Secondary | ICD-10-CM | POA: Diagnosis not present

## 2018-03-03 DIAGNOSIS — I5030 Unspecified diastolic (congestive) heart failure: Secondary | ICD-10-CM | POA: Diagnosis not present

## 2018-03-03 DIAGNOSIS — M810 Age-related osteoporosis without current pathological fracture: Secondary | ICD-10-CM | POA: Diagnosis not present

## 2018-03-03 DIAGNOSIS — Z96653 Presence of artificial knee joint, bilateral: Secondary | ICD-10-CM | POA: Diagnosis present

## 2018-03-03 DIAGNOSIS — N139 Obstructive and reflux uropathy, unspecified: Secondary | ICD-10-CM | POA: Diagnosis not present

## 2018-03-03 DIAGNOSIS — R339 Retention of urine, unspecified: Secondary | ICD-10-CM | POA: Diagnosis not present

## 2018-03-03 DIAGNOSIS — R4701 Aphasia: Secondary | ICD-10-CM | POA: Diagnosis not present

## 2018-03-03 DIAGNOSIS — F0390 Unspecified dementia without behavioral disturbance: Secondary | ICD-10-CM | POA: Diagnosis present

## 2018-03-03 DIAGNOSIS — Z9049 Acquired absence of other specified parts of digestive tract: Secondary | ICD-10-CM | POA: Diagnosis not present

## 2018-03-03 DIAGNOSIS — K5730 Diverticulosis of large intestine without perforation or abscess without bleeding: Secondary | ICD-10-CM | POA: Diagnosis not present

## 2018-03-03 DIAGNOSIS — R569 Unspecified convulsions: Secondary | ICD-10-CM | POA: Diagnosis not present

## 2018-03-03 DIAGNOSIS — E785 Hyperlipidemia, unspecified: Secondary | ICD-10-CM | POA: Diagnosis present

## 2018-03-03 DIAGNOSIS — R1311 Dysphagia, oral phase: Secondary | ICD-10-CM | POA: Diagnosis not present

## 2018-03-03 DIAGNOSIS — Z7401 Bed confinement status: Secondary | ICD-10-CM | POA: Diagnosis not present

## 2018-03-03 DIAGNOSIS — R29716 NIHSS score 16: Secondary | ICD-10-CM | POA: Diagnosis present

## 2018-03-03 DIAGNOSIS — E039 Hypothyroidism, unspecified: Secondary | ICD-10-CM | POA: Diagnosis not present

## 2018-03-03 DIAGNOSIS — N390 Urinary tract infection, site not specified: Secondary | ICD-10-CM | POA: Diagnosis present

## 2018-03-03 LAB — GLUCOSE, CAPILLARY: GLUCOSE-CAPILLARY: 75 mg/dL (ref 70–99)

## 2018-03-03 LAB — URINE CULTURE

## 2018-03-03 LAB — AMMONIA: Ammonia: 10 umol/L (ref 9–35)

## 2018-03-03 LAB — TSH: TSH: 7.189 u[IU]/mL — ABNORMAL HIGH (ref 0.350–4.500)

## 2018-03-03 LAB — VITAMIN B12: Vitamin B-12: 690 pg/mL (ref 180–914)

## 2018-03-03 MED ORDER — DILTIAZEM HCL-DEXTROSE 100-5 MG/100ML-% IV SOLN (PREMIX)
5.0000 mg/h | INTRAVENOUS | Status: DC
  Administered 2018-03-03: 5 mg/h via INTRAVENOUS
  Filled 2018-03-03: qty 100

## 2018-03-03 MED ORDER — DIGOXIN 125 MCG PO TABS
0.1250 mg | ORAL_TABLET | Freq: Every morning | ORAL | Status: DC
  Administered 2018-03-03 – 2018-03-07 (×4): 0.125 mg via ORAL
  Filled 2018-03-03 (×5): qty 1

## 2018-03-03 MED ORDER — ATORVASTATIN CALCIUM 10 MG PO TABS
20.0000 mg | ORAL_TABLET | Freq: Every day | ORAL | Status: DC
  Administered 2018-03-04 – 2018-03-06 (×4): 20 mg via ORAL
  Filled 2018-03-03 (×4): qty 2

## 2018-03-03 MED ORDER — DEXTROSE 5 % IV SOLN: INTRAVENOUS | Status: DC

## 2018-03-03 MED ORDER — DEXTROSE 5 % IV SOLN
INTRAVENOUS | Status: AC
  Administered 2018-03-03: 05:00:00 via INTRAVENOUS

## 2018-03-03 MED ORDER — LEVOTHYROXINE SODIUM 100 MCG/5ML IV SOLN
25.0000 ug | Freq: Every day | INTRAVENOUS | Status: DC
  Filled 2018-03-03: qty 5

## 2018-03-03 MED ORDER — MAGNESIUM SULFATE 2 GM/50ML IV SOLN
2.0000 g | Freq: Once | INTRAVENOUS | Status: AC
  Administered 2018-03-03: 2 g via INTRAVENOUS
  Filled 2018-03-03: qty 50

## 2018-03-03 MED ORDER — LEVOTHYROXINE SODIUM 50 MCG PO TABS
50.0000 ug | ORAL_TABLET | Freq: Every day | ORAL | Status: DC
  Administered 2018-03-04 – 2018-03-07 (×4): 50 ug via ORAL
  Filled 2018-03-03 (×4): qty 1

## 2018-03-03 MED ORDER — ENOXAPARIN SODIUM 60 MG/0.6ML ~~LOC~~ SOLN
1.0000 mg/kg | Freq: Two times a day (BID) | SUBCUTANEOUS | Status: DC
  Administered 2018-03-03 – 2018-03-07 (×10): 55 mg via SUBCUTANEOUS
  Filled 2018-03-03 (×10): qty 0.55

## 2018-03-03 MED ORDER — DONEPEZIL HCL 5 MG PO TABS
5.0000 mg | ORAL_TABLET | Freq: Every day | ORAL | Status: DC
  Administered 2018-03-04 – 2018-03-06 (×4): 5 mg via ORAL
  Filled 2018-03-03 (×4): qty 1

## 2018-03-03 MED ORDER — METOPROLOL SUCCINATE ER 100 MG PO TB24
100.0000 mg | ORAL_TABLET | Freq: Every morning | ORAL | Status: DC
  Administered 2018-03-03: 100 mg via ORAL
  Filled 2018-03-03 (×2): qty 1

## 2018-03-03 MED ORDER — HYDRALAZINE HCL 20 MG/ML IJ SOLN
10.0000 mg | Freq: Three times a day (TID) | INTRAMUSCULAR | Status: DC | PRN
  Administered 2018-03-04 – 2018-03-06 (×4): 10 mg via INTRAVENOUS
  Filled 2018-03-03 (×4): qty 1

## 2018-03-03 MED ORDER — HYDRALAZINE HCL 25 MG PO TABS
25.0000 mg | ORAL_TABLET | Freq: Two times a day (BID) | ORAL | Status: DC
  Filled 2018-03-03: qty 1

## 2018-03-03 MED ORDER — FLECAINIDE ACETATE 50 MG PO TABS
50.0000 mg | ORAL_TABLET | Freq: Two times a day (BID) | ORAL | Status: DC
  Administered 2018-03-03 – 2018-03-05 (×4): 50 mg via ORAL
  Filled 2018-03-03 (×5): qty 1

## 2018-03-03 NOTE — Progress Notes (Signed)
Modified Barium Swallow Progress Note  Patient Details  Name: [name] MRN: [id number] Date of Birth: [birth year]  Today's Date: 03/03/2018  Modified Barium Swallow completed.  Full report located under Chart Review in the Imaging Section.  Brief recommendations include the following:  Clinical Impression  Pt exhibited minimal oral dysphagia with discoordination of control and transit primarily with thin. Protective mechanisms including hyoid excursion, epiglottic retroflexion, arytenoid cartilidges approximation to epiglottic petiole were functional. Flash laryngeal penetration with straw present which is not considered abnormal. No significant residue and esophageal scan unremarkable (MBS only diagnosis above level of UES). Recommend regular texture, thin liquids, pills whole in applesauce, straws allowed and full supervision initially. ST will briefly follow.     Swallow Evaluation Recommendations       SLP Diet Recommendations: Regular solids;Thin liquid   Liquid Administration via: Cup;Straw   Medication Administration: Whole meds with puree   Supervision: Patient able to self feed;Full supervision/cueing for compensatory strategies   Compensations: Slow rate;Small sips/bites   Postural Changes: Seated upright at 90 degrees   Oral Care Recommendations: Oral care BID        Houston Siren 03/03/2018,1:40 PM   Orbie Pyo Hudson.Ed Risk analyst [phone] Office [phone]

## 2018-03-03 NOTE — Progress Notes (Signed)
  Speech Language Pathology Treatment: Dysphagia  Patient Details Name: RAFEEF Rogers MRN: [id number] DOB: 01-Nov-1932 Today's Date: 03/03/2018 Time: 3151-7616 SLP Time Calculation (min) (ACUTE ONLY): 15 min  Assessment / Plan / Recommendation Clinical Impression  Pt seen on [facility] today following transfer from [facility] yesterday. Delayed cough after solid and thin liquid. Volitional cough is weak, overall deconditioning, facial weakness and MRI results are pending therefore instrumental assessment recommended scheduled for this morning.    HPI HPI: [name] is a 85 y.o. female with medical history significant for paroxysmal A. Fib/atrial flutter, diastolic CHF, HTN, CVA, dementia, who presented to the ED after reports of initial confusion earlier today, patient's aide was with her, and reports that it appeared patient did not know how to use her walker, kept walking into the walls with a walker, her speech did not make sense also, this began at about 1030 today.  It was also noted that she was not moving her left side-about 1:30 PM.  Patient was also still confused.  Patients niece reports significant jecking of all patient's extremities, she is adamant and sure it was not tremors, reports her husband has Parkinson's and she knows the difference. BSE requested      SLP Plan  MBS       Recommendations  Diet recommendations: NPO                Oral Care Recommendations: Oral care QID Follow up Recommendations: Skilled Nursing facility SLP Visit Diagnosis: Dysphagia, unspecified (R13.10) Plan: MBS       GO                [name] 03/03/2018, 9:53 AM  [name].Ed Risk analyst [phone] Office [phone]

## 2018-03-03 NOTE — Progress Notes (Signed)
EEG Completed; Results Pending  

## 2018-03-03 NOTE — Progress Notes (Signed)
PROGRESS NOTE    [name]  LXB:[id number] DOB: 02-Oct-1932 DOA: 03/01/2018 PCP: Dettinger, Fransisca Kaufmann, MD   Brief Narrative: 85 year old with past medical history significant for paroxysmal A. fib, diastolic heart failure, dementia, hypertension, brought to the hospital with left side weakness, confusion, concern for seizure, she was also having some shakiness.  She received IV Keppra and Ativan in the emergency department.  CT of the brain did not show any new findings.  Patient remained lethargic after admission.  Assessment & Plan:   Active Problems:   Hypothyroidism   Paroxysmal atrial fibrillation (HCC)   HTN (hypertension)   Left-sided weakness   Seizure ([place])   Acute encephalopathy  1-Seizure; presents with confusion, left side weakness, shaking.  EEG; right hemispheric slowing.  Discussed with Dr Rory Percy, will get MRI. Keep on keppra for now. If continue to be sleepy by tomorrow could consider changing medications.  Control BP. Check Urine culture.   2-Acute metabolic encephalopathy;  Related to seizure. Check urine culture. Already on antibiotics.  Controlled BP.   3-PAF;  Resume metoprolol, fleicanide, digoxin. On lovenox.  Resume eliquis when able to take oral.   4-Hypothyroidism;  Resume synthroid. TSH elevated. Check Free T 3 and T 4.   5-HTN; resume metoprolol. Add hydralazine.   6-Dementia; resume aricept.   7-Fever; influenza negative, repeat urine culture. Chest x ray negative for PNA.  Stop vancomycin. On cefepime.  Blood culture no growth to date.     Estimated body mass index is 23.06 kg/m as calculated from the following:   Height as of this encounter: 5\' 2"  (1.575 m).   Weight as of this encounter: 57.2 kg.   DVT prophylaxis: lovenox Code Status: full code Family Communication: son at bedside.  Disposition Plan: remain in the hospital, for evaluation of encephalopathy   Consultants:   neurology   Procedures:    none  Antimicrobials:  cefepime  Subjective: Sleepy, wake up and answer questions. Follows command.  Denies chest pain.   Objective: Vitals:   03/03/18 0815 03/03/18 1040 03/03/18 1041 03/03/18 1100  BP:  (!) 144/94    Pulse: 83 94 87 74  Resp: 20 19 17 14   Temp:      TempSrc:      SpO2: 97% 98% 98% 96%  Weight:      Height:        Intake/Output Summary (Last 24 hours) at 03/03/2018 1133 Last data filed at 03/03/2018 0500 Gross per 24 hour  Intake -  Output 1550 ml  Net -1550 ml   Filed Weights   03/01/18 1358 03/01/18 1937  Weight: 59.5 kg 57.2 kg    Examination:  General exam: Appears calm and comfortable  Respiratory system: Clear to auscultation. Respiratory effort normal. Cardiovascular system: S1 & S2 heard, RRR. No JVD, murmurs, rubs, gallops or clicks. No pedal edema. Gastrointestinal system: Abdomen is nondistended, soft and nontender. No organomegaly or masses felt. Normal bowel sounds heard. Central nervous system: sleepy . No focal neurological deficits. Extremities: Symmetric 5 x 5 power. Skin: No rashes, lesions or ulcers   Data Reviewed: I have personally reviewed following labs and imaging studies  CBC: Recent Labs  Lab 03/01/18 1353 03/02/18 0842  WBC 7.8 8.4  NEUTROABS 4.9  --   HGB 12.8 13.0  HCT 40.5 40.5  MCV 93.5 95.5  PLT 271 416   Basic Metabolic Panel: Recent Labs  Lab 03/01/18 1410 03/02/18 0244 03/02/18 0842  NA 133*  --  134*  K 3.8  --  3.8  CL 99  --  98  CO2 25  --  27  GLUCOSE 161*  --  96  BUN 17  --  11  CREATININE 0.54  --  0.49  CALCIUM 8.6*  --  8.4*  MG  --  1.9  --    GFR: Estimated Creatinine Clearance: 41.4 mL/min (by C-G formula based on SCr of 0.49 mg/dL). Liver Function Tests: Recent Labs  Lab 03/01/18 1410  AST 22  ALT 15  ALKPHOS 64  BILITOT 0.5  PROT 6.7  ALBUMIN 3.7   No results for input(s): LIPASE, AMYLASE in the last 168 hours. Recent Labs  Lab 03/03/18 0803  AMMONIA 10    Coagulation Profile: Recent Labs  Lab 03/01/18 1410  INR 1.1   Cardiac Enzymes: Recent Labs  Lab 03/02/18 0244 03/02/18 0842 03/02/18 1458  TROPONINI 0.05* 0.04* <0.03   BNP (last 3 results) No results for input(s): PROBNP in the last 8760 hours. HbA1C: No results for input(s): HGBA1C in the last 72 hours. CBG: Recent Labs  Lab 03/01/18 1403 03/03/18 0431  GLUCAP 167* 75   Lipid Profile: No results for input(s): CHOL, HDL, LDLCALC, TRIG, CHOLHDL, LDLDIRECT in the last 72 hours. Thyroid Function Tests: Recent Labs    03/03/18 0803  TSH 7.189*   Anemia Panel: Recent Labs    03/03/18 0803  VITAMINB12 690   Sepsis Labs: Recent Labs  Lab 03/01/18 2058 03/02/18 0056  LATICACIDVEN 2.0* 1.1    Recent Results (from the past 240 hour(s))  Culture, Urine     Status: Abnormal   Collection Time: 03/01/18  8:23 PM  Result Value Ref Range Status   Specimen Description   Final    URINE, RANDOM Performed at [facility], [address] [place], [place] [address]    Special Requests   Final    NONE Performed at [facility], [address] [place], [place] [address]    [place], SUGGEST RECOLLECTION (A)  Final   Report Status 03/03/2018 FINAL  Final  Culture, blood (routine x 2)     Status: None (Preliminary result)   Collection Time: 03/01/18  8:56 PM  Result Value Ref Range Status   Specimen Description BLOOD RIGHT ARM  Final   Special Requests   Final    BOTTLES DRAWN AEROBIC AND ANAEROBIC Blood Culture adequate volume   Culture   Final    NO GROWTH 2 DAYS Performed at [facility], [address] [place], [place] [address]    Report Status PENDING  Incomplete  Culture, blood (routine x 2)     Status: None (Preliminary result)   Collection Time: 03/01/18  8:58 PM  Result Value Ref Range Status   Specimen Description BLOOD RIGHT HAND  Final   Special Requests   Final    BOTTLES DRAWN AEROBIC AND ANAEROBIC Blood Culture adequate  volume   Culture   Final    NO GROWTH 2 DAYS Performed at [facility], [address] [place], [place] [address]    Report Status PENDING  Incomplete         Radiology Studies: Ct Angio Head W Or Wo Contrast  Result Date: 03/01/2018 CLINICAL DATA:  Left-sided weakness.  Confusion.  Unsteady gait. EXAM: CT ANGIOGRAPHY HEAD AND NECK CT PERFUSION BRAIN TECHNIQUE: Multidetector CT imaging of the head and neck was performed using the standard protocol during bolus administration of intravenous contrast. Multiplanar CT image reconstructions and  MIPs were obtained to evaluate the vascular anatomy. Carotid stenosis measurements (when applicable) are obtained utilizing NASCET criteria, using the distal internal carotid diameter as the denominator. Multiphase CT imaging of the brain was performed following IV bolus contrast injection. Subsequent parametric perfusion maps were calculated using RAPID software. CONTRAST:  131mL ISOVUE-370 IOPAMIDOL (ISOVUE-370) INJECTION 76% COMPARISON:  Head and neck CTA 01/30/2018. FINDINGS: CTA NECK FINDINGS Aortic arch: Standard 3 vessel aortic arch with mild atherosclerotic plaque. Widely patent arch vessel origins. Right carotid system: Patent with mild atherosclerotic plaque at the carotid bifurcation. No evidence of significant stenosis or dissection. Tortuous mid cervical ICA with kinked appearance. Left carotid system: Patent without evidence of significant stenosis or dissection. Tortuous mid cervical ICA with kinked appearance. Vertebral arteries: Patent and codominant without evidence of significant stenosis or dissection although motion artifact limits assessment of the proximal left V1 segment (this was widely patent on the prior CTA however). Skeleton: Moderate cervical disc degeneration with neural foraminal stenosis due to uncovertebral spurring most notable on the left at C4-5 and bilaterally at C5-6. Other neck: No mass or enlarged lymph nodes identified.  Upper chest: Clear lung apices. Review of the MIP images confirms the above findings CTA HEAD FINDINGS Anterior circulation: The internal carotid arteries are widely patent from skull base to carotid termini with mild atherosclerotic plaque noted. A prominent right anterior choroidal artery is incidentally noted. ACAs and MCAs are patent without evidence of proximal branch occlusion or significant proximal stenosis. No aneurysm is identified. Posterior circulation: The intracranial vertebral arteries are widely patent to the basilar. Patent PICA and SCA origins are visualized bilaterally. The basilar artery is widely patent. Posterior communicating arteries are diminutive or absent. PCAs are patent without evidence of significant stenosis. No aneurysm is identified. Venous sinuses: Patent. Anatomic variants: None. Review of the MIP images confirms the above findings CT Brain Perfusion Findings: CBF (<30%) Volume: 33mL Perfusion (Tmax>6.0s) volume: 59mL Mismatch Volume: 65mL Infarction Location:No core infarct identified by RAPID. The Tmax volume is present in the right cerebral hemisphere primarily in the MCA territory, however perfusion imaging is significantly motion degraded and multiple data points were discarded casting doubt on the reliability of this data. IMPRESSION: 1. Mild atherosclerosis in the head and neck without evidence of large vessel occlusion or significant stenosis. 2. Motion degraded perfusion imaging of questionable reliability as detailed above. No core infarct identified. These results were called by telephone at the time of interpretation on 03/01/2018 at 3:45 pm to Dr. Dayna Barker, who verbally acknowledged these results. Electronically Signed   By: Logan Bores M.D.   On: 03/01/2018 16:03   Ct Angio Neck W Or Wo Contrast  Result Date: 03/01/2018 CLINICAL DATA:  Left-sided weakness.  Confusion.  Unsteady gait. EXAM: CT ANGIOGRAPHY HEAD AND NECK CT PERFUSION BRAIN TECHNIQUE: Multidetector CT  imaging of the head and neck was performed using the standard protocol during bolus administration of intravenous contrast. Multiplanar CT image reconstructions and MIPs were obtained to evaluate the vascular anatomy. Carotid stenosis measurements (when applicable) are obtained utilizing NASCET criteria, using the distal internal carotid diameter as the denominator. Multiphase CT imaging of the brain was performed following IV bolus contrast injection. Subsequent parametric perfusion maps were calculated using RAPID software. CONTRAST:  141mL ISOVUE-370 IOPAMIDOL (ISOVUE-370) INJECTION 76% COMPARISON:  Head and neck CTA 01/30/2018. FINDINGS: CTA NECK FINDINGS Aortic arch: Standard 3 vessel aortic arch with mild atherosclerotic plaque. Widely patent arch vessel origins. Right carotid system: Patent with mild atherosclerotic plaque at  the carotid bifurcation. No evidence of significant stenosis or dissection. Tortuous mid cervical ICA with kinked appearance. Left carotid system: Patent without evidence of significant stenosis or dissection. Tortuous mid cervical ICA with kinked appearance. Vertebral arteries: Patent and codominant without evidence of significant stenosis or dissection although motion artifact limits assessment of the proximal left V1 segment (this was widely patent on the prior CTA however). Skeleton: Moderate cervical disc degeneration with neural foraminal stenosis due to uncovertebral spurring most notable on the left at C4-5 and bilaterally at C5-6. Other neck: No mass or enlarged lymph nodes identified. Upper chest: Clear lung apices. Review of the MIP images confirms the above findings CTA HEAD FINDINGS Anterior circulation: The internal carotid arteries are widely patent from skull base to carotid termini with mild atherosclerotic plaque noted. A prominent right anterior choroidal artery is incidentally noted. ACAs and MCAs are patent without evidence of proximal branch occlusion or significant  proximal stenosis. No aneurysm is identified. Posterior circulation: The intracranial vertebral arteries are widely patent to the basilar. Patent PICA and SCA origins are visualized bilaterally. The basilar artery is widely patent. Posterior communicating arteries are diminutive or absent. PCAs are patent without evidence of significant stenosis. No aneurysm is identified. Venous sinuses: Patent. Anatomic variants: None. Review of the MIP images confirms the above findings CT Brain Perfusion Findings: CBF (<30%) Volume: 74mL Perfusion (Tmax>6.0s) volume: 51mL Mismatch Volume: 79mL Infarction Location:No core infarct identified by RAPID. The Tmax volume is present in the right cerebral hemisphere primarily in the MCA territory, however perfusion imaging is significantly motion degraded and multiple data points were discarded casting doubt on the reliability of this data. IMPRESSION: 1. Mild atherosclerosis in the head and neck without evidence of large vessel occlusion or significant stenosis. 2. Motion degraded perfusion imaging of questionable reliability as detailed above. No core infarct identified. These results were called by telephone at the time of interpretation on 03/01/2018 at 3:45 pm to Dr. Dayna Barker, who verbally acknowledged these results. Electronically Signed   By: Logan Bores M.D.   On: 03/01/2018 16:03   Ct Cerebral Perfusion W Contrast  Result Date: 03/01/2018 CLINICAL DATA:  Left-sided weakness.  Confusion.  Unsteady gait. EXAM: CT ANGIOGRAPHY HEAD AND NECK CT PERFUSION BRAIN TECHNIQUE: Multidetector CT imaging of the head and neck was performed using the standard protocol during bolus administration of intravenous contrast. Multiplanar CT image reconstructions and MIPs were obtained to evaluate the vascular anatomy. Carotid stenosis measurements (when applicable) are obtained utilizing NASCET criteria, using the distal internal carotid diameter as the denominator. Multiphase CT imaging of the  brain was performed following IV bolus contrast injection. Subsequent parametric perfusion maps were calculated using RAPID software. CONTRAST:  159mL ISOVUE-370 IOPAMIDOL (ISOVUE-370) INJECTION 76% COMPARISON:  Head and neck CTA 01/30/2018. FINDINGS: CTA NECK FINDINGS Aortic arch: Standard 3 vessel aortic arch with mild atherosclerotic plaque. Widely patent arch vessel origins. Right carotid system: Patent with mild atherosclerotic plaque at the carotid bifurcation. No evidence of significant stenosis or dissection. Tortuous mid cervical ICA with kinked appearance. Left carotid system: Patent without evidence of significant stenosis or dissection. Tortuous mid cervical ICA with kinked appearance. Vertebral arteries: Patent and codominant without evidence of significant stenosis or dissection although motion artifact limits assessment of the proximal left V1 segment (this was widely patent on the prior CTA however). Skeleton: Moderate cervical disc degeneration with neural foraminal stenosis due to uncovertebral spurring most notable on the left at C4-5 and bilaterally at C5-6. Other neck: No mass  or enlarged lymph nodes identified. Upper chest: Clear lung apices. Review of the MIP images confirms the above findings CTA HEAD FINDINGS Anterior circulation: The internal carotid arteries are widely patent from skull base to carotid termini with mild atherosclerotic plaque noted. A prominent right anterior choroidal artery is incidentally noted. ACAs and MCAs are patent without evidence of proximal branch occlusion or significant proximal stenosis. No aneurysm is identified. Posterior circulation: The intracranial vertebral arteries are widely patent to the basilar. Patent PICA and SCA origins are visualized bilaterally. The basilar artery is widely patent. Posterior communicating arteries are diminutive or absent. PCAs are patent without evidence of significant stenosis. No aneurysm is identified. Venous sinuses:  Patent. Anatomic variants: None. Review of the MIP images confirms the above findings CT Brain Perfusion Findings: CBF (<30%) Volume: 20mL Perfusion (Tmax>6.0s) volume: 54mL Mismatch Volume: 41mL Infarction Location:No core infarct identified by RAPID. The Tmax volume is present in the right cerebral hemisphere primarily in the MCA territory, however perfusion imaging is significantly motion degraded and multiple data points were discarded casting doubt on the reliability of this data. IMPRESSION: 1. Mild atherosclerosis in the head and neck without evidence of large vessel occlusion or significant stenosis. 2. Motion degraded perfusion imaging of questionable reliability as detailed above. No core infarct identified. These results were called by telephone at the time of interpretation on 03/01/2018 at 3:45 pm to Dr. Dayna Barker, who verbally acknowledged these results. Electronically Signed   By: Logan Bores M.D.   On: 03/01/2018 16:03   Dg Chest Port 1 View  Result Date: 03/01/2018 CLINICAL DATA:  Fever EXAM: PORTABLE CHEST 1 VIEW COMPARISON:  06/23/2017 FINDINGS: The heart size and mediastinal contours are within normal limits. Right basilar atelectasis. No pleural effusion or focal consolidation. Left total shoulder arthroplasty. The visualized skeletal structures are unremarkable. IMPRESSION: No active disease. Electronically Signed   By: Ulyses Jarred M.D.   On: 03/01/2018 23:20   Ct Head Code Stroke Wo Contrast  Result Date: 03/01/2018 CLINICAL DATA:  Code stroke. Left-sided weakness. Left facial droop. EXAM: CT HEAD WITHOUT CONTRAST TECHNIQUE: Contiguous axial images were obtained from the base of the skull through the vertex without intravenous contrast. COMPARISON:  MRI 01/30/2018, CT head 01/30/2018 FINDINGS: Brain: Negative for acute infarct.  Negative for hemorrhage or mass Generalized atrophy. Chronic microvascular ischemic changes in the white matter are moderately advanced since stable. Vascular:  Negative for hyperdense vessel Skull: Negative Sinuses/Orbits: Paranasal sinuses clear. Bilateral cataract surgery. Other: None ASPECTS ([facility] Stroke Program Early CT Score) - Ganglionic level infarction (caudate, lentiform nuclei, internal capsule, insula, M1-M3 cortex): 7 - Supraganglionic infarction (M4-M6 cortex): 3 Total score (0-10 with 10 being normal): 10 IMPRESSION: 1. No acute intracranial abnormality. 2. Atrophy and chronic microvascular ischemic changes stable. 3. ASPECTS is 10 4. These results were called by telephone at the time of interpretation on 03/01/2018 at 2:03 pm to Dr. Thurnell Garbe, who verbally acknowledged these results. Electronically Signed   By: Franchot Gallo M.D.   On: 03/01/2018 14:04        Scheduled Meds: . digoxin  0.125 mg Oral q morning - 10a  . enoxaparin (LOVENOX) injection  1 mg/kg Subcutaneous Q12H  . flecainide  50 mg Oral BID  . [START ON 03/04/2018] levothyroxine  50 mcg Oral QAC breakfast  . mouth rinse  15 mL Mouth Rinse BID  . metoprolol succinate  100 mg Oral q morning - 10a   Continuous Infusions: . ceFEPime (MAXIPIME) IV 2 g (03/03/18  0101)  . dextrose 50 mL/hr at 03/03/18 0506  . levETIRAcetam 750 mg (03/03/18 6378)  . magnesium sulfate 1 - 4 g bolus IVPB       LOS: 0 days    Time spent: 35 minutes    Elmarie Shiley, MD [facility]  03/03/2018, 11:33 AM

## 2018-03-03 NOTE — Consult Note (Addendum)
NEURO HOSPITALIST CONSULT NOTE   Requestig physician: Dr. Roderic Palau  Reason for Consult: Seizure and AMS  History obtained from:  Chart    HPI:                                                                                                                                          [name] is an 85 y.o. female with paroxysmal atrial fibrillation on Eliquis, diastolic heart failure, HTN and dementia who presented to the [facility] ED on Wednesday afternoon with left sided weakness, confusion and seizure activity. CT head with CTA/CTP were negative for acute infarction. The seizure activity consisted of limb jerking, for which she was administered IV Ativan prior to the CT scans for stroke work up. She was also loaded with Keppra 1000 mg IV. The description of the jerking was felt to be most consistent with generalized tonic-clonic seizure activity per Dr. Freddie Apley note. After receiving Ativan, she was lethargic and drowsy.    Per Dr. Freddie Apley note, she had been seen a few months ago for recurrent confusion with possible aphasia; she may have been on Keppra 250 BID at that time. An EEG was obtained at the time of that prior assessment, which was essentially normal. Her Keppra had been increased to 500 mg BID at that time. Of note, she had 2 EEGs in 2017 and also 2019 for recurrent episodes of confusion, but both were unrevealing.   She had an episode of left-sided focal neurological symptoms a few months ago, at which time it was thought that the patient had a TIA due to subtherapeutic Coumadin.   EEG was obtained in the [facility] ED, revealing continuous low voltage polymorphic delta activity in the right hemisphere. Some slowing also noted in the left hemisphere but with faster overlying frequencies and a posterior dominant alpha rhythm. No epileptiform activity was noted.   Dr. Merlene Laughter was consulted. His assessment documents an acute confusional state/altered mental status with  generalized tonic-clonic seizures: Her Keppra dose was increased to 750 mg twice a day. She was transferred to [facility] for further evaluation and management.   MRI brain 01/30/18: 1. Limited 4 sequence moderately motion degraded MRI head: No acute intracranial process. 2. Severe chronic small vessel ischemic changes and old small cerebellar infarcts. Marked global atrophy was also noted.   CTA HEAD NECK 03/01/18: 1. Mild atherosclerosis in the head and neck without evidence of large vessel occlusion or significant stenosis. 2. Motion degraded perfusion imaging of questionable reliability as detailed above. No core infarct identified.   Past Medical History:  Diagnosis Date  . Anemia   . Atrial fibrillation (HCC)    Paroxysmal, Flecainide therapy  . Calf pain    September, 2012, at rest  . Carotid bruit  Doppler, December, 2009, no abnormality  . Cataract   . Diverticulosis   . GERD (gastroesophageal reflux disease)   . History of shingles   . HLD (hyperlipidemia)   . HTN (hypertension)   . Hypothyroidism   . Insomnia   . Osteoarthritis   . PONV (postoperative nausea and vomiting)   . Rectal bleeding 2001   diverticulosis and int hemorrhoids on 07/1999 and 02/2010 colonoscopies.  . Seizures ([place])   . Stroke ([place])   . Warfarin anticoagulation     Past Surgical History:  Procedure Laterality Date  . BIOPSY  09/29/2015   Procedure: BIOPSY;  Surgeon: Rogene Houston, MD;  Location: [facility];  Service: Endoscopy;;  gastric  . CATARACT EXTRACTION W/PHACO Right 04/15/2014   Procedure: CATARACT EXTRACTION PHACO AND INTRAOCULAR LENS PLACEMENT (IOC);  Surgeon: Tonny Branch, MD;  Location: [facility] ORS;  Service: Ophthalmology;  Laterality: Right;  CDE: 13.30  . CATARACT EXTRACTION W/PHACO Left 05/13/2014   Procedure: CATARACT EXTRACTION PHACO AND INTRAOCULAR LENS PLACEMENT LEFT EYE;  Surgeon: Tonny Branch, MD;  Location: [facility];  Service: Ophthalmology;  Laterality: Left;  CDE:13.00  .  CHOLECYSTECTOMY N/A 01/15/2015   Procedure: LAPAROSCOPIC CHOLECYSTECTOMY;  Surgeon: Ralene Ok, MD;  Location: [place];  Service: General;  Laterality: N/A;  . COLONOSCOPY N/A 09/29/2015   Procedure: COLONOSCOPY;  Surgeon: Rogene Houston, MD;  Location[facility];  Service: Endoscopy;  Laterality: N/A;  . COLONOSCOPY    . ESOPHAGOGASTRODUODENOSCOPY N/A 01/13/2015   Procedure: ESOPHAGOGASTRODUODENOSCOPY (EGD);  Surgeon: Jerene Bears, MD;  Location: [facility] ENDOSCOPY;  Service: Endoscopy;  Laterality: N/A;  . ESOPHAGOGASTRODUODENOSCOPY N/A 09/29/2015   Procedure: ESOPHAGOGASTRODUODENOSCOPY (EGD);  Surgeon: Rogene Houston, MD;  Location: [facility];  Service: Endoscopy;  Laterality: N/A;  12:15  . GALLBLADDER SURGERY    . LEFT HEART CATH AND CORONARY ANGIOGRAPHY N/A 06/22/2016   Procedure: Left Heart Cath and Coronary Angiography;  Surgeon: Troy Sine, MD;  Location: [facility];  Service: Cardiovascular;  Laterality: N/A;  . SHOULDER OPEN ROTATOR CUFF REPAIR     bilateral  . TOTAL KNEE ARTHROPLASTY  05/26/10   right  . TOTAL KNEE ARTHROPLASTY  11/17/2010   Procedure: TOTAL KNEE ARTHROPLASTY;  Surgeon: Mauri Pole;  Location: [facility];  Service: Orthopedics;  Laterality: Left;  . TOTAL KNEE ARTHROPLASTY     Right  . TOTAL SHOULDER REPLACEMENT  01/2010   left    Family History  Problem Relation Age of Onset  . COPD Brother   . Atrial fibrillation Brother   . Diabetes Brother   . Liver cancer Brother        Alcohol-related  . Colon cancer Brother   . Heart attack Father   . Early death Father   . Heart disease Father   . Rheum arthritis Mother   . Arthritis Mother   . Heart disease Mother   . Coronary artery disease Brother   . Heart attack Daughter   . Gallbladder disease Brother               Social History:  reports that she has never smoked. She has never used smokeless tobacco. She reports that she does not drink alcohol or use drugs.  Allergies  Allergen  Reactions  . Penicillins Rash and Other (See Comments)    Has patient had a PCN reaction causing immediate rash, facial/tongue/throat swelling, SOB or lightheadedness with hypotension: No Has patient had a PCN reaction causing severe  rash involving mucus membranes or skin necrosis: No Has patient had a PCN reaction that required hospitalization No Has patient had a PCN reaction occurring within the last 10 years: No If all of the above answers are "NO", then may proceed with Cephalosporin use.    MEDICATIONS:                                                                                                                     Scheduled: . mouth rinse  15 mL Mouth Rinse BID   Continuous: . ceFEPime (MAXIPIME) IV 2 g (03/03/18 0101)  . dextrose    . diltiazem (CARDIZEM) infusion 5 mg/hr (03/03/18 0107)  . levETIRAcetam    . vancomycin       ROS:                                                                                                                                       Mild headache. No neck pain. No CP or SOB. Has some abdominal discomfort. Does not answer additional questions. Other ROS as per HPI.   Blood pressure (!) 187/97, pulse 64, temperature 98.7 F (37.1 C), temperature source Axillary, resp. rate 16, height 5\' 2"  (1.575 m), weight 57.2 kg, SpO2 96 %.   General Examination:                                                                                                       Physical Exam  HEENT-  [place]/AT    Lungs- Respirations unlabored Extremities- No edema  Neurological Examination Mental Status: Awake with mild drowsiness and decreased level of alertness. Decreased attention. Increased latency of verbal and motor responses. Oriented to self but not to city, state or year. Speech is slow but fluent and without dysarthria. Able to follow simple motor commands. Can name a badge and a pen but not examiner's thumb.  Cranial Nerves: II: Visual fields grossly intact with  no extinction to DSS. PERRL.  III,IV, VI: No  ptosis. EOMI with saccadic pursuits noted. No nystagmus.  V,VII: Smile is symmetric. Facial temp sensation equal bilaterally VIII: Hearing intact to voice IX,X: No hypophonia XI: Symmetric XII: Midline tongue extension Motor: BUE 4+/5 with increased latency of motor responses BLE 4/5 with poor effort and increased latency of motor responses Sensory: Temp and light touch intact in all 4 extremities without extinction Deep Tendon Reflexes: 2+ bilateral brachioradialis and biceps. 1+ right patella, 0 left patella. 0 achilles. Toes upgoing bilaterally  Cerebellar: No ataxia with FNF bilaterally  Gait: Deferred   Lab Results: Basic Metabolic Panel: Recent Labs  Lab 03/01/18 1410 03/02/18 0244 03/02/18 0842  NA 133*  --  134*  K 3.8  --  3.8  CL 99  --  98  CO2 25  --  27  GLUCOSE 161*  --  96  BUN 17  --  11  CREATININE 0.54  --  0.49  CALCIUM 8.6*  --  8.4*  MG  --  1.9  --     CBC: Recent Labs  Lab 03/01/18 1353 03/02/18 0842  WBC 7.8 8.4  NEUTROABS 4.9  --   HGB 12.8 13.0  HCT 40.5 40.5  MCV 93.5 95.5  PLT 271 256    Cardiac Enzymes: Recent Labs  Lab 03/02/18 0244 03/02/18 0842 03/02/18 1458  TROPONINI 0.05* 0.04* <0.03    Lipid Panel: No results for input(s): CHOL, TRIG, HDL, CHOLHDL, VLDL, LDLCALC in the last 168 hours.  Imaging: Ct Angio Head W Or Wo Contrast  Result Date: 03/01/2018 CLINICAL DATA:  Left-sided weakness.  Confusion.  Unsteady gait. EXAM: CT ANGIOGRAPHY HEAD AND NECK CT PERFUSION BRAIN TECHNIQUE: Multidetector CT imaging of the head and neck was performed using the standard protocol during bolus administration of intravenous contrast. Multiplanar CT image reconstructions and MIPs were obtained to evaluate the vascular anatomy. Carotid stenosis measurements (when applicable) are obtained utilizing NASCET criteria, using the distal internal carotid diameter as the denominator. Multiphase CT imaging  of the brain was performed following IV bolus contrast injection. Subsequent parametric perfusion maps were calculated using RAPID software. CONTRAST:  163mL ISOVUE-370 IOPAMIDOL (ISOVUE-370) INJECTION 76% COMPARISON:  Head and neck CTA 01/30/2018. FINDINGS: CTA NECK FINDINGS Aortic arch: Standard 3 vessel aortic arch with mild atherosclerotic plaque. Widely patent arch vessel origins. Right carotid system: Patent with mild atherosclerotic plaque at the carotid bifurcation. No evidence of significant stenosis or dissection. Tortuous mid cervical ICA with kinked appearance. Left carotid system: Patent without evidence of significant stenosis or dissection. Tortuous mid cervical ICA with kinked appearance. Vertebral arteries: Patent and codominant without evidence of significant stenosis or dissection although motion artifact limits assessment of the proximal left V1 segment (this was widely patent on the prior CTA however). Skeleton: Moderate cervical disc degeneration with neural foraminal stenosis due to uncovertebral spurring most notable on the left at C4-5 and bilaterally at C5-6. Other neck: No mass or enlarged lymph nodes identified. Upper chest: Clear lung apices. Review of the MIP images confirms the above findings CTA HEAD FINDINGS Anterior circulation: The internal carotid arteries are widely patent from skull base to carotid termini with mild atherosclerotic plaque noted. A prominent right anterior choroidal artery is incidentally noted. ACAs and MCAs are patent without evidence of proximal branch occlusion or significant proximal stenosis. No aneurysm is identified. Posterior circulation: The intracranial vertebral arteries are widely patent to the basilar. Patent PICA and SCA origins are visualized bilaterally. The basilar artery is widely patent. Posterior communicating arteries are  diminutive or absent. PCAs are patent without evidence of significant stenosis. No aneurysm is identified. Venous  sinuses: Patent. Anatomic variants: None. Review of the MIP images confirms the above findings CT Brain Perfusion Findings: CBF (<30%) Volume: 71mL Perfusion (Tmax>6.0s) volume: 73mL Mismatch Volume: 62mL Infarction Location:No core infarct identified by RAPID. The Tmax volume is present in the right cerebral hemisphere primarily in the MCA territory, however perfusion imaging is significantly motion degraded and multiple data points were discarded casting doubt on the reliability of this data. IMPRESSION: 1. Mild atherosclerosis in the head and neck without evidence of large vessel occlusion or significant stenosis. 2. Motion degraded perfusion imaging of questionable reliability as detailed above. No core infarct identified. These results were called by telephone at the time of interpretation on 03/01/2018 at 3:45 pm to Dr. Dayna Barker, who verbally acknowledged these results. Electronically Signed   By: Logan Bores M.D.   On: 03/01/2018 16:03   Ct Angio Neck W Or Wo Contrast  Result Date: 03/01/2018 CLINICAL DATA:  Left-sided weakness.  Confusion.  Unsteady gait. EXAM: CT ANGIOGRAPHY HEAD AND NECK CT PERFUSION BRAIN TECHNIQUE: Multidetector CT imaging of the head and neck was performed using the standard protocol during bolus administration of intravenous contrast. Multiplanar CT image reconstructions and MIPs were obtained to evaluate the vascular anatomy. Carotid stenosis measurements (when applicable) are obtained utilizing NASCET criteria, using the distal internal carotid diameter as the denominator. Multiphase CT imaging of the brain was performed following IV bolus contrast injection. Subsequent parametric perfusion maps were calculated using RAPID software. CONTRAST:  170mL ISOVUE-370 IOPAMIDOL (ISOVUE-370) INJECTION 76% COMPARISON:  Head and neck CTA 01/30/2018. FINDINGS: CTA NECK FINDINGS Aortic arch: Standard 3 vessel aortic arch with mild atherosclerotic plaque. Widely patent arch vessel origins. Right  carotid system: Patent with mild atherosclerotic plaque at the carotid bifurcation. No evidence of significant stenosis or dissection. Tortuous mid cervical ICA with kinked appearance. Left carotid system: Patent without evidence of significant stenosis or dissection. Tortuous mid cervical ICA with kinked appearance. Vertebral arteries: Patent and codominant without evidence of significant stenosis or dissection although motion artifact limits assessment of the proximal left V1 segment (this was widely patent on the prior CTA however). Skeleton: Moderate cervical disc degeneration with neural foraminal stenosis due to uncovertebral spurring most notable on the left at C4-5 and bilaterally at C5-6. Other neck: No mass or enlarged lymph nodes identified. Upper chest: Clear lung apices. Review of the MIP images confirms the above findings CTA HEAD FINDINGS Anterior circulation: The internal carotid arteries are widely patent from skull base to carotid termini with mild atherosclerotic plaque noted. A prominent right anterior choroidal artery is incidentally noted. ACAs and MCAs are patent without evidence of proximal branch occlusion or significant proximal stenosis. No aneurysm is identified. Posterior circulation: The intracranial vertebral arteries are widely patent to the basilar. Patent PICA and SCA origins are visualized bilaterally. The basilar artery is widely patent. Posterior communicating arteries are diminutive or absent. PCAs are patent without evidence of significant stenosis. No aneurysm is identified. Venous sinuses: Patent. Anatomic variants: None. Review of the MIP images confirms the above findings CT Brain Perfusion Findings: CBF (<30%) Volume: 31mL Perfusion (Tmax>6.0s) volume: 63mL Mismatch Volume: 75mL Infarction Location:No core infarct identified by RAPID. The Tmax volume is present in the right cerebral hemisphere primarily in the MCA territory, however perfusion imaging is significantly motion  degraded and multiple data points were discarded casting doubt on the reliability of this data. IMPRESSION: 1. Mild atherosclerosis  in the head and neck without evidence of large vessel occlusion or significant stenosis. 2. Motion degraded perfusion imaging of questionable reliability as detailed above. No core infarct identified. These results were called by telephone at the time of interpretation on 03/01/2018 at 3:45 pm to Dr. Dayna Barker, who verbally acknowledged these results. Electronically Signed   By: Logan Bores M.D.   On: 03/01/2018 16:03   Ct Cerebral Perfusion W Contrast  Result Date: 03/01/2018 CLINICAL DATA:  Left-sided weakness.  Confusion.  Unsteady gait. EXAM: CT ANGIOGRAPHY HEAD AND NECK CT PERFUSION BRAIN TECHNIQUE: Multidetector CT imaging of the head and neck was performed using the standard protocol during bolus administration of intravenous contrast. Multiplanar CT image reconstructions and MIPs were obtained to evaluate the vascular anatomy. Carotid stenosis measurements (when applicable) are obtained utilizing NASCET criteria, using the distal internal carotid diameter as the denominator. Multiphase CT imaging of the brain was performed following IV bolus contrast injection. Subsequent parametric perfusion maps were calculated using RAPID software. CONTRAST:  141mL ISOVUE-370 IOPAMIDOL (ISOVUE-370) INJECTION 76% COMPARISON:  Head and neck CTA 01/30/2018. FINDINGS: CTA NECK FINDINGS Aortic arch: Standard 3 vessel aortic arch with mild atherosclerotic plaque. Widely patent arch vessel origins. Right carotid system: Patent with mild atherosclerotic plaque at the carotid bifurcation. No evidence of significant stenosis or dissection. Tortuous mid cervical ICA with kinked appearance. Left carotid system: Patent without evidence of significant stenosis or dissection. Tortuous mid cervical ICA with kinked appearance. Vertebral arteries: Patent and codominant without evidence of significant stenosis  or dissection although motion artifact limits assessment of the proximal left V1 segment (this was widely patent on the prior CTA however). Skeleton: Moderate cervical disc degeneration with neural foraminal stenosis due to uncovertebral spurring most notable on the left at C4-5 and bilaterally at C5-6. Other neck: No mass or enlarged lymph nodes identified. Upper chest: Clear lung apices. Review of the MIP images confirms the above findings CTA HEAD FINDINGS Anterior circulation: The internal carotid arteries are widely patent from skull base to carotid termini with mild atherosclerotic plaque noted. A prominent right anterior choroidal artery is incidentally noted. ACAs and MCAs are patent without evidence of proximal branch occlusion or significant proximal stenosis. No aneurysm is identified. Posterior circulation: The intracranial vertebral arteries are widely patent to the basilar. Patent PICA and SCA origins are visualized bilaterally. The basilar artery is widely patent. Posterior communicating arteries are diminutive or absent. PCAs are patent without evidence of significant stenosis. No aneurysm is identified. Venous sinuses: Patent. Anatomic variants: None. Review of the MIP images confirms the above findings CT Brain Perfusion Findings: CBF (<30%) Volume: 60mL Perfusion (Tmax>6.0s) volume: 28mL Mismatch Volume: 22mL Infarction Location:No core infarct identified by RAPID. The Tmax volume is present in the right cerebral hemisphere primarily in the MCA territory, however perfusion imaging is significantly motion degraded and multiple data points were discarded casting doubt on the reliability of this data. IMPRESSION: 1. Mild atherosclerosis in the head and neck without evidence of large vessel occlusion or significant stenosis. 2. Motion degraded perfusion imaging of questionable reliability as detailed above. No core infarct identified. These results were called by telephone at the time of interpretation  on 03/01/2018 at 3:45 pm to Dr. Dayna Barker, who verbally acknowledged these results. Electronically Signed   By: Logan Bores M.D.   On: 03/01/2018 16:03   Dg Chest Port 1 View  Result Date: 03/01/2018 CLINICAL DATA:  Fever EXAM: PORTABLE CHEST 1 VIEW COMPARISON:  06/23/2017 FINDINGS: The heart size and  mediastinal contours are within normal limits. Right basilar atelectasis. No pleural effusion or focal consolidation. Left total shoulder arthroplasty. The visualized skeletal structures are unremarkable. IMPRESSION: No active disease. Electronically Signed   By: Ulyses Jarred M.D.   On: 03/01/2018 23:20   Ct Head Code Stroke Wo Contrast  Result Date: 03/01/2018 CLINICAL DATA:  Code stroke. Left-sided weakness. Left facial droop. EXAM: CT HEAD WITHOUT CONTRAST TECHNIQUE: Contiguous axial images were obtained from the base of the skull through the vertex without intravenous contrast. COMPARISON:  MRI 01/30/2018, CT head 01/30/2018 FINDINGS: Brain: Negative for acute infarct.  Negative for hemorrhage or mass Generalized atrophy. Chronic microvascular ischemic changes in the white matter are moderately advanced since stable. Vascular: Negative for hyperdense vessel Skull: Negative Sinuses/Orbits: Paranasal sinuses clear. Bilateral cataract surgery. Other: None ASPECTS ([place] Stroke Program Early CT Score) - Ganglionic level infarction (caudate, lentiform nuclei, internal capsule, insula, M1-M3 cortex): 7 - Supraganglionic infarction (M4-M6 cortex): 3 Total score (0-10 with 10 being normal): 10 IMPRESSION: 1. No acute intracranial abnormality. 2. Atrophy and chronic microvascular ischemic changes stable. 3. ASPECTS is 10 4. These results were called by telephone at the time of interpretation on 03/01/2018 at 2:03 pm to Dr. Thurnell Garbe, who verbally acknowledged these results. Electronically Signed   By: Franchot Gallo M.D.   On: 03/01/2018 14:04    Assessment: 85 year old female presenting to OSH with left sided  weakness and jerking suspected to be due to seizure activity 1. Keppra dose was increased by Dr. Merlene Laughter at OSH 2. CT head, CTA/CTP at OSH were negative for acute infarction.  3. No asymmetry on follow up neurological exam here at [facility], which suggests that the left sided weakness at OSH was secondary to Todd's paresis.  4. EEG at OSH was with right hemispheric slowing, also consistent with postictal phenomenon 5. Mild dementia, on Aricept as outpatient.   Recommendations: 1. Continue Keppra at 750 mg BID 2. Repeat EEG in the AM to assess for improvement 3. Continue anticoagulation for her atrial fibrillation.  4. Per Hospitalist H and P at [facility], her home medications digoxin, flecainide, metoprolol and Eliquis were held. Eliquis should be restarted as soon as possible. Will defer to primary team regarding restarting her other meds.  5. Restart Aricept when able.   Addendum: The patient has failed three swallow evaluation attempts. She is scheduled for barium swallow in the AM. Therapeutic Lovenox is indicated until able to take PO at which time Lovenox should be switched to Eliquis. Therapeutic Lovenox has been ordered at 1 mg/kg BID.   Electronically signed: Dr. Kerney Elbe 03/03/2018, 4:43 AM

## 2018-03-03 NOTE — Evaluation (Signed)
Physical Therapy Evaluation Patient Details Name: [name] MRN: [id number] DOB: 1932/05/24 Today's Date: 03/03/2018   History of Present Illness  Patient is a 85 y/o female who presents with gait difficulties and left sided weakness. Concern for generalized tonic clonic seizures. Recent hospitalization 1/27-1/31 for aphasia/encephalopathy secondary to TIA/seizures. EKG-abnormal electroencephalogram secondary to right hemispheric slowing. PMH includes PAF, TIAs, dementia, diastolic heart failure, CAD, HTN, seizure disorder, L TSA, bil TKA, right RTC repair.  Clinical Impression  Patient presents with left inattention, tone in LUE, decreased arousal, impaired attention, impaired sitting/standing balance and impaired mobility s/p above. Pt with focused attention, decreased ability to follow simple 1 step commands consistently and impaired arousal. Pt reports living with sister, using RW for ambulation and performing ADLs independently. Not sure the level of support her sister can provide at d/c. Tolerated transfers and side stepping along side bed with Mod A of 2 for support/balance. Would benefit from SNF to maximize independence and mobility prior to return home. Will follow acutely.    Follow Up Recommendations SNF;Supervision for mobility/OOB    Equipment Recommendations  None recommended by PT    Recommendations for Other Services       Precautions / Restrictions Precautions Precautions: Fall Restrictions Weight Bearing Restrictions: No      Mobility  Bed Mobility Overal bed mobility: Needs Assistance Bed Mobility: Rolling;Sit to Sidelying;Supine to Sit Rolling: Mod assist;+2 for physical assistance   Supine to sit: Max assist;+2 for physical assistance   Sit to sidelying: Max assist;+2 for physical assistance General bed mobility comments: Assist with BLEs, bottom and to elevate trunk to get to EOB. Assist to lower to return to supine. Rolling to right/left for pericare  due to purewick falling out.  Transfers Overall transfer level: Needs assistance Equipment used: 2 person hand held assist Transfers: Sit to/from Stand Sit to Stand: Mod assist;+2 physical assistance         General transfer comment: ASsist of 2 to power to standing with cues for anterior weight shift and upright, forward flexed posture. Stood from [employer].   Ambulation/Gait Ambulation/Gait assistance: Mod assist;+2 physical assistance Gait Distance (Feet): 2 Feet Assistive device: 2 person hand held assist Gait Pattern/deviations: Step-to pattern Gait velocity: decreased   General Gait Details: Able to side step along side bed with Mod A of 2 and assist for weight shifting. No knee buckling noted.   Stairs            Wheelchair Mobility    Modified Rankin (Stroke Patients Only) Modified Rankin (Stroke Patients Only) Pre-Morbid Rankin Score: Moderately severe disability Modified Rankin: Moderately severe disability     Balance Overall balance assessment: Needs assistance Sitting-balance support: Feet supported;No upper extremity supported Sitting balance-Leahy Scale: Poor Sitting balance - Comments: Pt with left lateral lean, able to self correct and bring to midline but not sustain. Requires external support esp for dynamic tasks. Postural control: Left lateral lean Standing balance support: During functional activity Standing balance-Leahy Scale: Poor Standing balance comment: Requires external support for standing balance.                              Pertinent Vitals/Pain Pain Assessment: Faces Faces Pain Scale: Hurts a little bit Pain Location: RUE Pain Descriptors / Indicators: Discomfort(not able to describe) Pain Intervention(s): Monitored during session    Home Living Family/patient expects to be discharged to:: Private residence Living Arrangements: Other relatives(sister) Available Help at Discharge:  Family;Available 24 hours/day Type  of Home: House Home Access: Stairs to enter Entrance Stairs-Rails: None Entrance Stairs-Number of Steps: 1 Home Layout: One level Home Equipment: Walker - 2 wheels;Shower seat;Grab bars - tub/shower;Cane - single point      Prior Function Level of Independence: Needs assistance   Gait / Transfers Assistance Needed: Uses RW for ambulation. Chart says she is getting HHPT  ADL's / Homemaking Assistance Needed: sister cooks, cleans, and manages her medication.  Pt sponge bathes.        Hand Dominance   Dominant Hand: Right    Extremity/Trunk Assessment   Upper Extremity Assessment Upper Extremity Assessment: Defer to OT evaluation;LUE deficits/detail;Difficult to assess due to impaired cognition LUE Deficits / Details: Tone present in LUE    Lower Extremity Assessment Lower Extremity Assessment: Generalized weakness(grossly ~3+/5 as pt able to stand and take steps without knees buckling however difficult to assess due cognition)    Cervical / Trunk Assessment Cervical / Trunk Assessment: Kyphotic  Communication   Communication: HOH  Cognition Arousal/Alertness: Lethargic Behavior During Therapy: Flat affect Overall Cognitive Status: Difficult to assess Area of Impairment: Orientation;Attention;Following commands;Awareness;Problem solving                 Orientation Level: Disoriented to;Time;Situation Current Attention Level: Focused   Following Commands: Follows one step commands with increased time;Follows one step commands inconsistently     Problem Solving: Slow processing;Decreased initiation;Requires verbal cues;Requires tactile cues General Comments: "I think I had a stroke." "December." Requires repeating of name to stay aroused and alert. Repetition needed to attend to task/therapist. Right gaze preference. Able to attend to left side with cues but not sustain. Perseverates on tasks/movements throughout.      General Comments General comments (skin  integrity, edema, etc.): VSS throughout    Exercises     Assessment/Plan    PT Assessment Patient needs continued PT services  PT Problem List Decreased strength;Decreased mobility;Decreased safety awareness;Impaired tone;Decreased range of motion;Decreased activity tolerance;Decreased cognition;Pain;Decreased balance       PT Treatment Interventions Functional mobility training;Patient/family education;Balance training;Gait training;Therapeutic activities;Therapeutic exercise;Neuromuscular re-education;Cognitive remediation;DME instruction    PT Goals (Current goals can be found in the Care Plan section)  Acute Rehab PT Goals Patient Stated Goal: none stated  PT Goal Formulation: Patient unable to participate in goal setting Time For Goal Achievement: 03/17/18 Potential to Achieve Goals: Fair    Frequency Min 3X/week   Barriers to discharge        Co-evaluation PT/OT/SLP Co-Evaluation/Treatment: Yes Reason for Co-Treatment: For patient/therapist safety;To address functional/ADL transfers;Necessary to address cognition/behavior during functional activity PT goals addressed during session: Mobility/safety with mobility;Balance         AM-PAC PT "6 Clicks" Mobility  Outcome Measure Help needed turning from your back to your side while in a flat bed without using bedrails?: A Lot Help needed moving from lying on your back to sitting on the side of a flat bed without using bedrails?: A Lot Help needed moving to and from a bed to a chair (including a wheelchair)?: A Lot Help needed standing up from a chair using your arms (e.g., wheelchair or bedside chair)?: A Lot Help needed to walk in hospital room?: A Lot Help needed climbing 3-5 steps with a railing? : Total 6 Click Score: 11    End of Session Equipment Utilized During Treatment: Gait belt Activity Tolerance: Patient limited by lethargy Patient left: in bed;with call bell/phone within reach;with bed alarm set Nurse  Communication:  Mobility status PT Visit Diagnosis: Difficulty in walking, not elsewhere classified (R26.2);Muscle weakness (generalized) (M62.81);Unsteadiness on feet (R26.81);Pain Pain - Right/Left: Right Pain - part of body: Arm    Time: 6438-3818 PT Time Calculation (min) (ACUTE ONLY): 30 min   Charges:   PT Evaluation $PT Eval Moderate Complexity: 1 Mod          Wray Kearns, PT, DPT Acute Rehabilitation Services Pager [phone] Office [phone]      Marguarite Arbour A Sabra Heck 03/03/2018, 3:48 PM

## 2018-03-03 NOTE — Evaluation (Signed)
Occupational Therapy Evaluation Patient Details Name: [name] MRN: [id number] DOB: 30-Apr-1932 Today's Date: 03/03/2018    History of Present Illness Patient is a 85 y/o female who presents with gait difficulties and left sided weakness. Concern for generalized tonic clonic seizures. Recent hospitalization 1/27-1/31 for aphasia/encephalopathy secondary to TIA/seizures. EKG-abnormal electroencephalogram secondary to right hemispheric slowing. PMH includes PAF, TIAs, dementia, diastolic heart failure, CAD, HTN, seizure disorder, L TSA, bil TKA, right RTC repair.   Clinical Impression   This 85 y/o female presents with the above. PLOF obtained via chart review as pt unable to accurately provide. PTA pt was using RW for mobility, mod independent with ADL and receiving assist for iADL. Pt currently requires two person assist for bed mobility, sit<>stand and to take small side steps along EOB. She presents with LUE deficits, visual and cognitive impairments impacting her functional performance. Pt requires at least minA for sitting balance EOB, requires overall totalA for ADL completion at this time due to delayed processing and intermittent lethargy. Pt following one step commands but requires max multimodal cues and repetition to do so. She will benefit from continued acute OT services and recommend follow up therapy services in SNF setting after discharge to maximize her safety and independence with ADL and mobility. Will follow.     Follow Up Recommendations  SNF;Supervision/Assistance - 24 hour    Equipment Recommendations  Other (comment)(TBD in next venue)           Precautions / Restrictions Precautions Precautions: Fall Restrictions Weight Bearing Restrictions: No      Mobility Bed Mobility Overal bed mobility: Needs Assistance Bed Mobility: Rolling;Sit to Sidelying;Supine to Sit Rolling: Mod assist;+2 for physical assistance   Supine to sit: Max assist;+2 for physical  assistance   Sit to sidelying: Max assist;+2 for physical assistance General bed mobility comments: Assist with BLEs, bottom and to elevate trunk to get to EOB. Assist to lower to return to supine. Rolling to right/left for pericare due to purewick falling out.  Transfers Overall transfer level: Needs assistance Equipment used: 2 person hand held assist Transfers: Sit to/from Stand Sit to Stand: Mod assist;+2 physical assistance         General transfer comment: ASsist of 2 to power to standing with cues for anterior weight shift and upright, forward flexed posture. Stood from [employer].     Balance Overall balance assessment: Needs assistance Sitting-balance support: Feet supported;No upper extremity supported Sitting balance-Leahy Scale: Poor Sitting balance - Comments: Pt with left lateral lean, able to self correct and bring to midline but not sustain. Requires external support esp for dynamic tasks. Postural control: Left lateral lean Standing balance support: During functional activity Standing balance-Leahy Scale: Poor Standing balance comment: Requires external support for standing balance.                            ADL either performed or assessed with clinical judgement   ADL Overall ADL's : Needs assistance/impaired                                       General ADL Comments: pt currently requires totalA for all aspects of ADL at this time; pt incontinent of urine during session requires assist to change bed linens and for pericare      Vision   Vision Assessment?: Yes;Vision impaired- to be  further tested in functional context Additional Comments: pt requires max cues to attend to L side and will not track when attempts made to perform formal visual assessment; pt able to tell therapist number of fingers held up when placed at midline and towards R visual field but will not attend towards L visual field to do so     Perception     Praxis       Pertinent Vitals/Pain Pain Assessment: Faces Faces Pain Scale: Hurts a little bit Pain Location: RUE Pain Descriptors / Indicators: Discomfort Pain Intervention(s): Limited activity within patient's tolerance;Monitored during session;Repositioned     Hand Dominance Right   Extremity/Trunk Assessment Upper Extremity Assessment Upper Extremity Assessment: LUE deficits/detail LUE Deficits / Details: Tone present in LUE; not able to actively move at this time, PROM WFL LUE Sensation: decreased light touch LUE Coordination: decreased fine motor;decreased gross motor   Lower Extremity Assessment Lower Extremity Assessment: Defer to PT evaluation   Cervical / Trunk Assessment Cervical / Trunk Assessment: Kyphotic   Communication Communication Communication: HOH   Cognition Arousal/Alertness: Lethargic Behavior During Therapy: Flat affect Overall Cognitive Status: Difficult to assess Area of Impairment: Orientation;Attention;Following commands;Awareness;Problem solving                 Orientation Level: Disoriented to;Time;Situation Current Attention Level: Focused   Following Commands: Follows one step commands with increased time;Follows one step commands inconsistently     Problem Solving: Slow processing;Decreased initiation;Requires verbal cues;Requires tactile cues General Comments: "I think I had a stroke." "December." Requires repeating of name to stay aroused and alert. Repetition needed to attend to task/therapist. Right gaze preference. Able to attend to left side with cues but not sustain. Perseverates on tasks/movements throughout.   General Comments  VSS    Exercises     Shoulder Instructions      Home Living Family/patient expects to be discharged to:: Private residence Living Arrangements: Other relatives(sister) Available Help at Discharge: Family;Available 24 hours/day Type of Home: House Home Access: Stairs to enter [employer]  of Steps: 1 Entrance Stairs-Rails: None Home Layout: One level     Bathroom Shower/Tub: Teacher, early years/pre: Standard     Home Equipment: Environmental consultant - 2 wheels;Shower seat;Grab bars - tub/shower;Cane - single point          Prior Functioning/Environment Level of Independence: Needs assistance  Gait / Transfers Assistance Needed: Uses RW for ambulation. Chart says she is getting HHPT ADL's / Homemaking Assistance Needed: sister cooks, cleans, and manages her medication.  Pt sponge bathes. Communication / Swallowing Assistance Needed: HOH          OT Problem List: Decreased strength;Decreased range of motion;Decreased activity tolerance;Impaired balance (sitting and/or standing);Impaired vision/perception;Decreased coordination;Decreased cognition;Decreased safety awareness;Impaired UE functional use;Decreased knowledge of use of DME or AE;Impaired sensation      OT Treatment/Interventions: Self-care/ADL training;Therapeutic exercise;Neuromuscular education;Energy conservation;DME and/or AE instruction;Therapeutic activities;Patient/family education;Balance training;Cognitive remediation/compensation    OT Goals(Current goals can be found in the care plan section) Acute Rehab OT Goals Patient Stated Goal: none stated  OT Goal Formulation: With patient Time For Goal Achievement: 03/17/18 Potential to Achieve Goals: Fair  OT Frequency: Min 2X/week   Barriers to D/C:            Co-evaluation PT/OT/SLP Co-Evaluation/Treatment: Yes Reason for Co-Treatment: Complexity of the patient's impairments (multi-system involvement);Necessary to address cognition/behavior during functional activity;For patient/therapist safety;To address functional/ADL transfers PT goals addressed during session: Mobility/safety with mobility;Balance OT goals addressed during  session: ADL's and self-care;Strengthening/ROM      AM-PAC OT "6 Clicks" Daily Activity     Outcome Measure Help  from another person eating meals?: Total Help from another person taking care of personal grooming?: Total Help from another person toileting, which includes using toliet, bedpan, or urinal?: Total Help from another person bathing (including washing, rinsing, drying)?: Total Help from another person to put on and taking off regular upper body clothing?: Total Help from another person to put on and taking off regular lower body clothing?: Total 6 Click Score: 6   End of Session Nurse Communication: Mobility status  Activity Tolerance: Patient limited by lethargy Patient left: in bed;with call bell/phone within reach;with bed alarm set  OT Visit Diagnosis: Muscle weakness (generalized) (M62.81);Other symptoms and signs involving the nervous system (R29.898)                Time: 7445-1460 OT Time Calculation (min): 33 min Charges:  OT General Charges $OT Visit: 1 Visit OT Evaluation $OT Eval Moderate Complexity: [place], OT [employer] Pager [phone] Office [phone]   Raymondo Band 03/03/2018, 4:43 PM

## 2018-03-03 NOTE — Progress Notes (Signed)
VAST RN consulted to check PIV placed via [place] this morning. PIV is very positional, but flushes easily when positioned slightly up and with tension on line. Dressing changed and tubing secured to allow PIV to infuse without difficulty. PIV flushed with 5 20mL syringes throughout re-taping process. Informed pt's RN, Ave Filter who verbalized understanding. Pt has limited venous access.

## 2018-03-03 NOTE — Progress Notes (Signed)
2nd PIV occluded. IV team consulted d/t difficulty stick. Awaiting for IV team to replace PIV.  Ave Filter, RN

## 2018-03-03 NOTE — Procedures (Signed)
ELECTROENCEPHALOGRAM REPORT   Patient: [name]       Room #: 7D53G EEG No. ID: [id number] Age: 85 y.o.        Sex: female Referring Physician: Regalado Report Date:  03/03/2018        Interpreting Physician: Alexis Goodell  History: [name] is an 85 y.o. female with altered mental status and focal jerking  Medications:  Maxipime, Keppra, Digoxin, Tambocor, Synthroid, Toprol  Conditions of Recording:  This is a 21 channel routine scalp EEG performed with bipolar and monopolar montages arranged in accordance to the international 10/20 system of electrode placement. One channel was dedicated to EKG recording.  The patient is in the awake but confused state.  Description:  The background activity is asymmetric. The right hemisphere is slow and poorly organized.  It consists of a low voltage, polymorphic delta activity that is continuous and diffusely distributed over the right hemisphere.  Over the left hemisphere although there is an underlying polymorphic delta activity noted at times the patient does achieve a rhythm that consists of a posterior background rhythm at a maximum of 8-9 Hz alpha.  A mixture of theta and alpha rhythms are noted from the central and temporal regions.   No epileptiform activity is noted.   The patient does not drowse or sleep. Hyperventilation and intermittent photic stimulation were not performed.   IMPRESSION: This is an abnormal electroencephalogram secondary to right hemispheric slowing.  This suggests a focal disturbance, etiology nonspecific.  No epileptiform activity is noted.      Alexis Goodell, MD Neurology [phone] 03/03/2018, 1:40 PM

## 2018-03-04 ENCOUNTER — Inpatient Hospital Stay (HOSPITAL_COMMUNITY): Payer: Medicare Other

## 2018-03-04 LAB — RESPIRATORY PANEL BY PCR
Adenovirus: NOT DETECTED
Bordetella pertussis: NOT DETECTED
Chlamydophila pneumoniae: NOT DETECTED
Coronavirus 229E: NOT DETECTED
Coronavirus HKU1: NOT DETECTED
Coronavirus NL63: NOT DETECTED
Coronavirus OC43: NOT DETECTED
Influenza A: NOT DETECTED
Influenza B: NOT DETECTED
Metapneumovirus: NOT DETECTED
Mycoplasma pneumoniae: NOT DETECTED
PARAINFLUENZA VIRUS 1-RVPPCR: NOT DETECTED
PARAINFLUENZA VIRUS 2-RVPPCR: NOT DETECTED
PARAINFLUENZA VIRUS 3-RVPPCR: NOT DETECTED
Parainfluenza Virus 4: NOT DETECTED
RHINOVIRUS / ENTEROVIRUS - RVPPCR: NOT DETECTED
Respiratory Syncytial Virus: NOT DETECTED

## 2018-03-04 LAB — CBC
HCT: 42.6 % (ref 36.0–46.0)
Hemoglobin: 14.4 g/dL (ref 12.0–15.0)
MCH: 30.3 pg (ref 26.0–34.0)
MCHC: 33.8 g/dL (ref 30.0–36.0)
MCV: 89.5 fL (ref 80.0–100.0)
Platelets: 283 10*3/uL (ref 150–400)
RBC: 4.76 MIL/uL (ref 3.87–5.11)
RDW: 14.8 % (ref 11.5–15.5)
WBC: 8.5 10*3/uL (ref 4.0–10.5)
nRBC: 0 % (ref 0.0–0.2)

## 2018-03-04 LAB — MRSA PCR SCREENING: MRSA by PCR: POSITIVE — AB

## 2018-03-04 LAB — URINALYSIS, ROUTINE W REFLEX MICROSCOPIC
Bacteria, UA: NONE SEEN
Bilirubin Urine: NEGATIVE
Glucose, UA: NEGATIVE mg/dL
Ketones, ur: 80 mg/dL — AB
Leukocytes,Ua: NEGATIVE
Nitrite: NEGATIVE
Protein, ur: 30 mg/dL — AB
Specific Gravity, Urine: 1.026 (ref 1.005–1.030)
pH: 5 (ref 5.0–8.0)

## 2018-03-04 LAB — BASIC METABOLIC PANEL
Anion gap: 15 (ref 5–15)
BUN: 14 mg/dL (ref 8–23)
CHLORIDE: 92 mmol/L — AB (ref 98–111)
CO2: 22 mmol/L (ref 22–32)
Calcium: 8.3 mg/dL — ABNORMAL LOW (ref 8.9–10.3)
Creatinine, Ser: 0.58 mg/dL (ref 0.44–1.00)
GFR calc Af Amer: >60 mL/min (ref >60)
GFR calc non Af Amer: >60 mL/min (ref >60)
GLUCOSE: 82 mg/dL (ref 70–99)
Potassium: 3.5 mmol/L (ref 3.5–5.1)
Sodium: 129 mmol/L — ABNORMAL LOW (ref 135–145)

## 2018-03-04 LAB — GLUCOSE, CAPILLARY: Glucose-Capillary: 86 mg/dL (ref 70–99)

## 2018-03-04 LAB — T4, FREE: Free T4: 1.63 ng/dL (ref 0.82–1.77)

## 2018-03-04 MED ORDER — MUPIROCIN 2 % EX OINT
1.0000 "application " | TOPICAL_OINTMENT | Freq: Two times a day (BID) | CUTANEOUS | Status: DC
  Administered 2018-03-04 – 2018-03-07 (×6): 1 via NASAL
  Filled 2018-03-04: qty 22

## 2018-03-04 MED ORDER — METOPROLOL TARTRATE 5 MG/5ML IV SOLN: 5.0000 mg | Freq: Three times a day (TID) | INTRAVENOUS | Status: DC

## 2018-03-04 MED ORDER — SODIUM CHLORIDE 0.9 % IV SOLN
INTRAVENOUS | Status: DC
  Administered 2018-03-04 (×2): via INTRAVENOUS

## 2018-03-04 MED ORDER — SODIUM CHLORIDE 0.9 % IV SOLN
15.0000 mg/kg | Freq: Once | INTRAVENOUS | Status: AC
  Administered 2018-03-04: 858 mg via INTRAVENOUS
  Filled 2018-03-04: qty 17.16

## 2018-03-04 MED ORDER — METOPROLOL TARTRATE 5 MG/5ML IV SOLN
5.0000 mg | INTRAVENOUS | Status: AC | PRN
  Administered 2018-03-04 (×2): 5 mg via INTRAVENOUS
  Filled 2018-03-04 (×2): qty 5

## 2018-03-04 MED ORDER — CHLORHEXIDINE GLUCONATE CLOTH 2 % EX PADS
6.0000 | MEDICATED_PAD | Freq: Every day | CUTANEOUS | Status: DC
  Administered 2018-03-05 – 2018-03-07 (×3): 6 via TOPICAL

## 2018-03-04 MED ORDER — PHENYTOIN SODIUM EXTENDED 100 MG PO CAPS
100.0000 mg | ORAL_CAPSULE | Freq: Three times a day (TID) | ORAL | Status: DC
  Administered 2018-03-04 – 2018-03-07 (×9): 100 mg via ORAL
  Filled 2018-03-04 (×9): qty 1

## 2018-03-04 MED ORDER — METOPROLOL TARTRATE 5 MG/5ML IV SOLN
5.0000 mg | Freq: Three times a day (TID) | INTRAVENOUS | Status: DC
  Administered 2018-03-04 – 2018-03-05 (×3): 5 mg via INTRAVENOUS
  Filled 2018-03-04 (×3): qty 5

## 2018-03-04 MED ORDER — VANCOMYCIN HCL 10 G IV SOLR
1250.0000 mg | INTRAVENOUS | Status: DC
  Administered 2018-03-05: 1250 mg via INTRAVENOUS
  Filled 2018-03-04 (×2): qty 1250

## 2018-03-04 MED ORDER — HYDRALAZINE HCL 25 MG PO TABS
25.0000 mg | ORAL_TABLET | Freq: Two times a day (BID) | ORAL | Status: DC
  Filled 2018-03-04: qty 1

## 2018-03-04 MED ORDER — VANCOMYCIN HCL IN DEXTROSE 1-5 GM/200ML-% IV SOLN
1000.0000 mg | Freq: Once | INTRAVENOUS | Status: AC
  Administered 2018-03-04: 1000 mg via INTRAVENOUS
  Filled 2018-03-04 (×2): qty 200

## 2018-03-04 NOTE — Progress Notes (Addendum)
NEURO HOSPITALIST PROGRESS NOTE   Subjective: Patient is in bed resting comfortably. NAD. Seizure precautions in place.   Exam: Vitals:   03/04/18 0000 03/04/18 0551  BP: (!) 172/91 (!) 158/91  Pulse: 78   Resp: 15   Temp: 99 F (37.2 C) (!) 101.1 F (38.4 C)  SpO2: 96% 96%    Physical Exam   HEENT-  Normocephalic, no lesions, without obvious abnormality.  Normal external eye and conjunctiva.  Neck supple Cardiovascular- S1-S2 audible, pulses palpable throughout   Lungs-no rhonchi or wheezing noted, no excessive working breathing.  Saturations within normal limits Extremities- Warm, dry and intact Musculoskeletal-no joint tenderness, deformity or swelling Skin-warm and dry, no hyperpigmentation, vitiligo, or suspicious lesions Neuro:  Mental Status: Alert, oriented to name only.    Able to follow raise arms to command slowly.  Cranial Nerves: Blinks to threat bilaterally, tracks examiner around bed. PERRL. No ptosis noted. Hearing intact to voice. Motor/ Sensory: Moves all 4 extremities to noxious stimuli. Cerebellar: UTA Gait: deferred    Medications:  Scheduled: . atorvastatin  20 mg Oral QHS  . digoxin  0.125 mg Oral q morning - 10a  . donepezil  5 mg Oral QHS  . enoxaparin (LOVENOX) injection  1 mg/kg Subcutaneous Q12H  . flecainide  50 mg Oral BID  . hydrALAZINE  25 mg Oral BID  . levothyroxine  50 mcg Oral QAC breakfast  . mouth rinse  15 mL Mouth Rinse BID  . metoprolol succinate  100 mg Oral q morning - 10a   Continuous: . ceFEPime (MAXIPIME) IV 2 g (03/03/18 2354)  . levETIRAcetam 750 mg (03/03/18 1631)   JHE:RDEYCXKGYJEHU, hydrALAZINE, ondansetron  Pertinent Labs/Diagnostics:   Mr Brain Wo Contrast  Result Date: 03/03/2018 CLINICAL DATA:  Altered mental status EXAM: MRI HEAD WITHOUT CONTRAST TECHNIQUE: Multiplanar, multiecho pulse sequences of the brain and surrounding structures were obtained without intravenous contrast.  COMPARISON:  CTA head neck 03/01/2018 FINDINGS: BRAIN: There is no acute infarct, acute hemorrhage, hydrocephalus or extra-axial collection. The midline structures are normal. No midline shift or other mass effect. Diffuse confluent hyperintense T2-weighted signal within the periventricular, deep and juxtacortical white matter, most commonly due to chronic ischemic microangiopathy. Generalized atrophy without lobar predilection. Susceptibility-sensitive sequences show no chronic microhemorrhage or superficial siderosis. Atrophic appearance of both medial temporal lobes. VASCULAR: Major intracranial arterial and venous sinus flow voids are normal. SKULL AND UPPER CERVICAL SPINE: Calvarial bone marrow signal is normal. There is no skull base mass. Visualized upper cervical spine and soft tissues are normal. SINUSES/ORBITS: No fluid levels or advanced mucosal thickening. No mastoid or middle ear effusion. The orbits are normal. IMPRESSION: 1. No acute intracranial abnormality. 2. Atrophy and chronic ischemic microangiopathy. Electronically Signed   By: Ulyses Jarred M.D.   On: 03/03/2018 22:33   Dg Swallowing Func-speech Pathology  Result Date: 03/03/2018 Objective Swallowing Evaluation: Type of Study: MBS-Modified Barium Swallow Study  Patient Details Name: [name] MRN: [id number] Date of Birth: 1932/06/23 Today's Date: 03/03/2018 Time: SLP Start Time (ACUTE ONLY): 1013 -SLP Stop Time (ACUTE ONLY): 1030 SLP Time Calculation (min) (ACUTE ONLY): 17 min Past Medical History: Past Medical History: Diagnosis Date . Anemia  . Atrial fibrillation (HCC)   Paroxysmal, Flecainide therapy . Calf pain   September, 2012, at rest . Carotid bruit   Doppler, December, 2009, no abnormality . Cataract  .  Diverticulosis  . GERD (gastroesophageal reflux disease)  . History of shingles  . HLD (hyperlipidemia)  . HTN (hypertension)  . Hypothyroidism  . Insomnia  . Osteoarthritis  . PONV (postoperative nausea and vomiting)  . Rectal  bleeding 2001  diverticulosis and int hemorrhoids on 07/1999 and 02/2010 colonoscopies. . Seizures ([place])  . Stroke ([place])  . Warfarin anticoagulation  Past Surgical History: Past Surgical History: Procedure Laterality Date . BIOPSY  09/29/2015  Procedure: BIOPSY;  Surgeon: Rogene Houston, MD;  [facility];  Service: Endoscopy;;  gastric . CATARACT EXTRACTION W/PHACO Right 04/15/2014  Procedure: CATARACT EXTRACTION PHACO AND INTRAOCULAR LENS PLACEMENT (IOC);  Surgeon: Tonny Branch, MD;  Location: [facility];  Service: Ophthalmology;  Laterality: Right;  CDE: 13.30 . CATARACT EXTRACTION W/PHACO Left 05/13/2014  Procedure: CATARACT EXTRACTION PHACO AND INTRAOCULAR LENS PLACEMENT LEFT EYE;  Surgeon: Tonny Branch, MD;  Location: [facility];  Service: Ophthalmology;  Laterality: Left;  CDE:13.00 . CHOLECYSTECTOMY N/A 01/15/2015  Procedure: LAPAROSCOPIC CHOLECYSTECTOMY;  Surgeon: Ralene Ok, MD;  Location: [facility];  Service: General;  Laterality: N/A; . COLONOSCOPY N/A 09/29/2015  Procedure: COLONOSCOPY;  Surgeon: Rogene Houston, MD;  [facility];  Service: Endoscopy;  Laterality: N/A; . COLONOSCOPY   . ESOPHAGOGASTRODUODENOSCOPY N/A 01/13/2015  Procedure: ESOPHAGOGASTRODUODENOSCOPY (EGD);  Surgeon: Jerene Bears, MD;  Location: [facility] ENDOS[facility];  Service: Endoscopy;  Laterality: N/A; . ESOPHAGOGASTRODUODENOSCOPY N/A 09/29/2015  Procedure: ESOPHAGOGASTRODUODENOSCOPY (EGD);  Surgeon: Rogene Houston, MD;  [facility];  Service: Endoscopy;  Laterality: N/A;  12:15 . GALLBLADDER SURGERY   . LEFT HEART CATH AND CORONARY ANGIOGRAPHY N/A 06/22/2016  Procedure: Left Heart Cath and Coronary Angiography;  Surgeon: Troy Sine, MD;  Location: [facility];  Service: Cardiovascular;  Laterality: N/A; . SHOULDER OPEN ROTATOR CUFF REPAIR    bilateral . TOTAL KNEE ARTHROPLASTY  05/26/10  right . TOTAL KNEE ARTHROPLASTY  11/17/2010  Procedure: TOTAL KNEE ARTHROPLASTY;  Surgeon: Mauri Pole;  Location: [facility];   Service: Orthopedics;  Laterality: Left; . TOTAL KNEE ARTHROPLASTY    Right . TOTAL SHOULDER REPLACEMENT  01/2010  left HPI: [name] is a 85 y.o. female with medical history significant for paroxysmal A. Fib/atrial flutter, diastolic CHF, HTN, CVA, dementia, who presented to the ED after reports of initial confusion earlier today, patient's aide was with her, and reports that it appeared patient did not know how to use her walker, kept walking into the walls with a walker, her speech did not make sense also, this began at about 1030 today.  It was also noted that she was not moving her left side-about 1:30 PM.  Patient was also still confused.  Patients niece reports significant jecking of all patient's extremities, she is adamant and sure it was not tremors, reports her husband has Parkinson's and she knows the difference. BSE requested  Subjective: "Mmhmmm." Assessment / Plan / Recommendation CHL IP CLINICAL IMPRESSIONS 03/03/2018 Clinical Impression Pt exhibited minimal oral dysphagia with discoordination of control and transit primarily with thin. Protective mechanisms including hyoid excursion, epiglottic retroflexion, arytenoid cartilidges approximation to epiglottic petiole were functional. Flash laryngeal penetration with straw present which is not considered abnormal. No significant residue and esophageal scan unremarkable (MBS only diagnosis above level of UES). Recommend regular texture, thin liquids, pills whole in applesauce, straws allowed and full supervision initially. ST will briefly follow.   SLP Visit Diagnosis Dysphagia, oral phase (R13.11) Attention and concentration deficit following -- Frontal lobe and  executive function deficit following -- Impact on safety and function Mild aspiration risk   CHL IP TREATMENT RECOMMENDATION 03/03/2018 Treatment Recommendations Therapy as outlined in treatment plan below   Prognosis 03/03/2018 Prognosis for Safe Diet Advancement Good Barriers to Reach Goals  -- Barriers/Prognosis Comment -- CHL IP DIET RECOMMENDATION 03/03/2018 SLP Diet Recommendations Regular solids;Thin liquid Liquid Administration via Cup;Straw Medication Administration Whole meds with puree Compensations Slow rate;Small sips/bites Postural Changes Seated upright at 90 degrees   CHL IP OTHER RECOMMENDATIONS 03/03/2018 Recommended Consults -- Oral Care Recommendations Oral care BID Other Recommendations --   CHL IP FOLLOW UP RECOMMENDATIONS 03/03/2018 Follow up Recommendations Skilled Nursing facility   [facility] IP FREQUENCY AND DURATION 03/03/2018 Speech Therapy Frequency (ACUTE ONLY) min 2x/week Treatment Duration 1 week      CHL IP ORAL PHASE 03/03/2018 Oral Phase Impaired Oral - Pudding Teaspoon -- Oral - Pudding Cup -- Oral - Honey Teaspoon -- Oral - Honey Cup -- Oral - Nectar Teaspoon -- Oral - Nectar Cup -- Oral - Nectar Straw -- Oral - Thin Teaspoon -- Oral - Thin Cup Reduced posterior propulsion Oral - Thin Straw Reduced posterior propulsion Oral - Puree -- Oral - Mech Soft -- Oral - Regular WFL Oral - Multi-Consistency -- Oral - Pill -- Oral Phase - Comment --  CHL IP PHARYNGEAL PHASE 03/03/2018 Pharyngeal Phase Impaired Pharyngeal- Pudding Teaspoon -- Pharyngeal -- Pharyngeal- Pudding Cup -- Pharyngeal -- Pharyngeal- Honey Teaspoon -- Pharyngeal -- Pharyngeal- Honey Cup -- Pharyngeal -- Pharyngeal- Nectar Teaspoon -- Pharyngeal -- Pharyngeal- Nectar Cup -- Pharyngeal -- Pharyngeal- Nectar Straw -- Pharyngeal -- Pharyngeal- Thin Teaspoon -- Pharyngeal -- Pharyngeal- Thin Cup WFL Pharyngeal -- Pharyngeal- Thin Straw Penetration/Aspiration during swallow Pharyngeal Material enters airway, remains ABOVE vocal cords then ejected out Pharyngeal- Puree -- Pharyngeal -- Pharyngeal- Mechanical Soft -- Pharyngeal -- Pharyngeal- Regular WFL Pharyngeal -- Pharyngeal- Multi-consistency -- Pharyngeal -- Pharyngeal- Pill -- Pharyngeal -- Pharyngeal Comment --  CHL IP CERVICAL ESOPHAGEAL PHASE 03/03/2018 Cervical  Esophageal Phase (No Data) Pudding Teaspoon -- Pudding Cup -- Honey Teaspoon -- Honey Cup -- Nectar Teaspoon -- Nectar Cup -- Nectar Straw -- Thin Teaspoon -- Thin Cup -- Thin Straw -- Puree -- Mechanical Soft -- Regular -- Multi-consistency -- Pill -- Cervical Esophageal Comment -- [place] Siren 03/03/2018, 1:36 PM Orbie Pyo Litaker M.Ed Actor Pager [phone] Office [phone]              rEEG 2/ 28/2020: This is an abnormal electroencephalogram secondary toright hemispheric slowing. This suggests a focal disturbance, etiology nonspecific.  No epileptiform activity is noted.    Laurey Morale, MSN, NP-C [facility] [phone]  Attending neurologist's note to follow  Attending addendum Patient seen and examined Agree with the physical above  Assessment: 85 year old female presenting to OSH with left sided weakness and jerking suspected to be due to seizure activity Her Keppra dose was increased by Dr. Merlene Laughter at OSH. CT head, CTA/CTP at OSH were negative for acute infarction.  EEG at OSH was with right hemispheric slowing, also consistent with postictal phenomenon. Second EEG also with slowing and no seizures.  Patient has Mild dementia, on Aricept as outpatient. Patient had barium swallow study 03/03/2018: SLP recommends regular texture, thin liquids with assistance initially. Pills in apple sauce.  It is unclear as to why she is continuing to be encephalopathic. EEG with right slowing (done twice). Yesterday spiked a fever as well (101.1) MRI with no new stroke or lateralized  lesions that would explain targeted slowing in that region (reviewed personally by me). Likely prolonged post-ictal state and decompensation of mental status in the setting of baseline dementia. If does not improve over the next day or so, might consider LTM EEG. Will attempt to meet family and get more information on baseline. She is fully anticoagulated, and no neck  stiffness - don't see an emergent need for LP at this time. Infectious w/u being done by primary team  Recommendations: --Continue Keppra at 750 mg BID  --Per Hospitalist H and P at [facility], her home medications digoxin, flecainide, metoprolol and Eliquis were held. Eliquis should be restarted as soon as possible. Will defer to primary team regarding restarting her other meds.  --Restart Aricept when able.  -- lovenox until patient can take PO then switch to Eliquis ( for a. Fib)   -- Amie Portland, MD [facility] Pager: [phone] If 7pm to 7am, please call on call as listed on AMION.

## 2018-03-04 NOTE — Clinical Social Work Note (Signed)
Clinical Social Work Assessment  Patient Details  Name: [name] MRN: [id number] Date of Birth: October 14, 1932  Date of referral:  03/04/18               Reason for consult:  Facility Placement                Permission sought to share information with:  Facility Sport and exercise psychologist, Family Supports Permission granted to share information::  No  Name::     Paramedic::  SNFs  Relationship::  Son  Contact Information:  [phone]  Housing/Transportation Living arrangements for the past 2 months:  Single Family Home Source of Information:  Adult Children Patient Interpreter Needed:  None Criminal Activity/Legal Involvement Pertinent to Current Situation/Hospitalization:  No - Comment as needed Significant Relationships:  Adult Children Lives with:  Siblings(Sister) Do you feel safe going back to the place where you live?  Yes Need for family participation in patient care:  Yes (Comment)  Care giving concerns:  CSW received consult for possible SNF placement at time of discharge. CSW spoke with patient's son regarding PT recommendation of SNF placement at time of discharge. Patient's son reported that patient lives at home with her sister and a sitter with home health coming in. He reported that if patient needs SNF, he will be agreeable but he prefers for patient to return home. He would like to speak with the Physician to see how patient is doing. CSW to continue to follow and assist with discharge planning needs.   Social Worker assessment / plan:  CSW spoke with patient's son concerning possibility of rehab at [facility] before returning home.  Employment status:  Retired Nurse, adult PT Recommendations:  [place] / Referral to community resources:  [place]  Patient/Family's Response to care:  Patient's son reports that he would like referral to be sent to [facility] if SNF is needed. He states that they  tried to get patient there last month but insurance denied it. CSW will send referral.  Patient/Family's Understanding of and Emotional Response to Diagnosis, Current Treatment, and Prognosis:  Patient/family is realistic regarding therapy needs and expressed being hopeful for return home. Patient's son expressed understanding of CSW role and discharge process as well as medical condition. No questions/concerns about plan or treatment.    Emotional Assessment Appearance:  Appears stated age Attitude/Demeanor/Rapport:  Unable to Assess Affect (typically observed):  Unable to Assess Orientation:  Oriented to Self Alcohol / Substance use:  Not Applicable Psych involvement (Current and /or in the community):  No (Comment)  Discharge Needs  Concerns to be addressed:  Care Coordination Readmission within the last 30 days:  Yes Current discharge risk:  Dependent with Mobility, Cognitively Impaired Barriers to Discharge:  Continued Medical Work up   [employer], LCSW 03/04/2018, 2:01 PM

## 2018-03-04 NOTE — Progress Notes (Signed)
MEDICATION RELATED CONSULT NOTE - INITIAL   Pharmacy Consult for phenytoin Indication: seizures  Patient Measurements: Height: 5\' 2"  (157.5 cm) Weight: 126 lb 1.7 oz (57.2 kg) IBW/kg (Calculated) : 50.1   Vital Signs: Temp: 100.5 F (38.1 C) (02/29 1051) Temp Source: Rectal (02/29 1051) BP: 152/97 (02/29 0815) Pulse Rate: 82 (02/29 0815) Intake/Output from previous day: 02/28 0701 - 02/29 0700 In: 460 [P.O.:360; IV Piggyback:100] Out: 750 [Urine:750] Intake/Output from this shift: No intake/output data recorded.  Labs: Recent Labs    03/02/18 0244 03/02/18 0842 03/04/18 0754  WBC  --  8.4 8.5  HGB  --  13.0 14.4  HCT  --  40.5 42.6  PLT  --  256 283  CREATININE  --  0.49 0.58  MG 1.9  --   --     Medical History: Past Medical History:  Diagnosis Date  . Anemia   . Atrial fibrillation (HCC)    Paroxysmal, Flecainide therapy  . Calf pain    September, 2012, at rest  . Carotid bruit    Doppler, December, 2009, no abnormality  . Cataract   . Diverticulosis   . GERD (gastroesophageal reflux disease)   . History of shingles   . HLD (hyperlipidemia)   . HTN (hypertension)   . Hypothyroidism   . Insomnia   . Osteoarthritis   . PONV (postoperative nausea and vomiting)   . Rectal bleeding 2001   diverticulosis and int hemorrhoids on 07/1999 and 02/2010 colonoscopies.  . Seizures ([place])   . Stroke ([place])   . Warfarin anticoagulation      Assessment: 85 yo female with L-sided weakness and encephalopathy. She was started on keppra for seizures - the family states she has been more lethargic since starting this. Therefore, the team is changing to phenytoin.  She has been appropriately loaded with fosphenytoin and started on an appropriate dose of phenytoin. Noted that the patient was on apixaban prior to admission > she has been converted to Lovenox during this admission. Phenytoin and apixaban are contraindicated due to a drug-drug interaction. Her anticoagulation  will have to be reassessed when she is ready for PO.   Goal of Therapy:  Seizure cessation  Plan:  -Continue phenytoin 100 mg q8h -Will order levels as appropriate  Harvel Quale 03/04/2018,7:07 PM

## 2018-03-04 NOTE — Progress Notes (Signed)
PROGRESS NOTE    [name]  UKG:[id number] DOB: [birth year] DOA: 03/01/2018 PCP: Dettinger, Fransisca Kaufmann, MD   Brief Narrative: 85 year old with past medical history significant for paroxysmal A. fib, diastolic heart failure, dementia, hypertension, brought to the hospital with left side weakness, confusion, concern for seizure, she was also having some shakiness.  She received IV Keppra and Ativan in the emergency department.  CT of the brain did not show any new findings.  Patient remained lethargic after admission.  Assessment & Plan:   Active Problems:   Hypothyroidism   Paroxysmal atrial fibrillation (HCC)   HTN (hypertension)   Left-sided weakness   Seizure ([place])   Acute encephalopathy  1-Seizure; presents with confusion, left side weakness, shaking.  EEG; right hemispheric slowing.  MRI negative for Stroke.  Control BP.  Keppra increase to 750 mg BID during this admission.   2-Acute metabolic encephalopathy;  Related to seizure, infection.  Controlled BP.  Spike fever today. Will resume vancomycin.  Repeat Blood culture, check CT abdomen.  Repeated chest x ray negative for Pneumonia.  Urine culture ordered.    3-PAF;  Resume metoprolol, fleicanide, digoxin. On lovenox.  Resume eliquis when able to take oral.  Lethargic today to take meds. Will change metoprolol to IV,   4-Hypothyroidism;  Resume synthroid. TSH elevated. Check Free T 3 and T 4 normal. .   5-HTN; resume metoprolol. Add hydralazine.   6-Dementia; resume aricept.   7-Fever; influenza negative,  Chest x ray negative for PNA.  On cefepime. Resume vancomycin Blood culture no growth to date.  Repeat blood culture, CT abdomen, Chest x ray. Urine culture.   8-Hyponatremia; start IV fluids.   Estimated body mass index is 23.06 kg/m as calculated from the following:   Height as of this encounter: 5\' 2"  (1.575 m).   Weight as of this encounter: 57.2 kg.   DVT prophylaxis: lovenox Code Status: full  code Family Communication: son at bedside.  Disposition Plan: remain in the hospital, for evaluation of encephalopathy   Consultants:   neurology   Procedures:   none  Antimicrobials:  cefepime  She is more sleepy, open eyes, does not answer questions.   Objective: Vitals:   03/04/18 0000 03/04/18 0551 03/04/18 0815 03/04/18 1051  BP: (!) 172/91 (!) 158/91 (!) 152/97   Pulse: 78  82   Resp: 15  15   Temp: 99 F (37.2 C) (!) 101.1 F (38.4 C)  (!) 100.5 F (38.1 C)  TempSrc: Axillary Axillary  Rectal  SpO2: 96% 96% 94%   Weight:      Height:        Intake/Output Summary (Last 24 hours) at 03/04/2018 1205 Last data filed at 03/04/2018 0000 Gross per 24 hour  Intake 340 ml  Output 750 ml  Net -410 ml   Filed Weights   03/01/18 1358 03/01/18 1937  Weight: 59.5 kg 57.2 kg    Examination:  General exam: NAD, letahrgic Respiratory system: CTA Cardiovascular system: S 1, S 2 RRR Gastrointestinal system: BS present, soft, nt Central nervous system: lethargic, not following command Extremities: no edema  Data Reviewed: I have personally reviewed following labs and imaging studies  CBC: Recent Labs  Lab 03/01/18 1353 03/02/18 0842 03/04/18 0754  WBC 7.8 8.4 8.5  NEUTROABS 4.9  --   --   HGB 12.8 13.0 14.4  HCT 40.5 40.5 42.6  MCV 93.5 95.5 89.5  PLT 271 256 762   Basic Metabolic Panel: Recent Labs  Lab 03/01/18 1410 03/02/18 0244 03/02/18 0842 03/04/18 0754  NA 133*  --  134* 129*  K 3.8  --  3.8 3.5  CL 99  --  98 92*  CO2 25  --  27 22  GLUCOSE 161*  --  96 82  BUN 17  --  11 14  CREATININE 0.54  --  0.49 0.58  CALCIUM 8.6*  --  8.4* 8.3*  MG  --  1.9  --   --    GFR: Estimated Creatinine Clearance: 41.4 mL/min (by C-G formula based on SCr of 0.58 mg/dL). Liver Function Tests: Recent Labs  Lab 03/01/18 1410  AST 22  ALT 15  ALKPHOS 64  BILITOT 0.5  PROT 6.7  ALBUMIN 3.7   No results for input(s): LIPASE, AMYLASE in the last 168  hours. Recent Labs  Lab 03/03/18 0803  AMMONIA 10   Coagulation Profile: Recent Labs  Lab 03/01/18 1410  INR 1.1   Cardiac Enzymes: Recent Labs  Lab 03/02/18 0244 03/02/18 0842 03/02/18 1458  TROPONINI 0.05* 0.04* <0.03   BNP (last 3 results) No results for input(s): PROBNP in the last 8760 hours. HbA1C: No results for input(s): HGBA1C in the last 72 hours. CBG: Recent Labs  Lab 03/01/18 1403 03/03/18 0431 03/04/18 1045  GLUCAP 167* 75 86   Lipid Profile: No results for input(s): CHOL, HDL, LDLCALC, TRIG, CHOLHDL, LDLDIRECT in the last 72 hours. Thyroid Function Tests: Recent Labs    03/03/18 0803 03/04/18 0648  TSH 7.189*  --   FREET4  --  1.63   Anemia Panel: Recent Labs    03/03/18 0803  VITAMINB12 690   Sepsis Labs: Recent Labs  Lab 03/01/18 2058 03/02/18 0056  LATICACIDVEN 2.0* 1.1    Recent Results (from the past 240 hour(s))  Culture, Urine     Status: Abnormal   Collection Time: 03/01/18  8:23 PM  Result Value Ref Range Status   Specimen Description   Final    URINE, RANDOM Performed at [facility], [address], [place], [place] [address]    Special Requests   Final    NONE Performed at [facility], [address] [place], [place] [address]    [place], SUGGEST RECOLLECTION (A)  Final   Report Status 03/03/2018 FINAL  Final  Culture, blood (routine x 2)     Status: None (Preliminary result)   Collection Time: 03/01/18  8:56 PM  Result Value Ref Range Status   Specimen Description BLOOD RIGHT ARM  Final   Special Requests   Final    BOTTLES DRAWN AEROBIC AND ANAEROBIC Blood Culture adequate volume   Culture   Final    NO GROWTH 3 DAYS Performed at [facility], [address], [place], [place] [address]    Report Status PENDING  Incomplete  Culture, blood (routine x 2)     Status: None (Preliminary result)   Collection Time: 03/01/18  8:58 PM  Result Value Ref Range Status   Specimen Description  BLOOD RIGHT HAND  Final   Special Requests   Final    BOTTLES DRAWN AEROBIC AND ANAEROBIC Blood Culture adequate volume   Culture   Final    NO GROWTH 3 DAYS Performed at [facility], [address], [place], [place] [address]    Report Status PENDING  Incomplete         Radiology Studies: Mr Brain Wo Contrast  Result Date: 03/03/2018 CLINICAL DATA:  Altered mental status  EXAM: MRI HEAD WITHOUT CONTRAST TECHNIQUE: Multiplanar, multiecho pulse sequences of the brain and surrounding structures were obtained without intravenous contrast. COMPARISON:  CTA head neck 03/01/2018 FINDINGS: BRAIN: There is no acute infarct, acute hemorrhage, hydrocephalus or extra-axial collection. The midline structures are normal. No midline shift or other mass effect. Diffuse confluent hyperintense T2-weighted signal within the periventricular, deep and juxtacortical white matter, most commonly due to chronic ischemic microangiopathy. Generalized atrophy without lobar predilection. Susceptibility-sensitive sequences show no chronic microhemorrhage or superficial siderosis. Atrophic appearance of both medial temporal lobes. VASCULAR: Major intracranial arterial and venous sinus flow voids are normal. SKULL AND UPPER CERVICAL SPINE: Calvarial bone marrow signal is normal. There is no skull base mass. Visualized upper cervical spine and soft tissues are normal. SINUSES/ORBITS: No fluid levels or advanced mucosal thickening. No mastoid or middle ear effusion. The orbits are normal. IMPRESSION: 1. No acute intracranial abnormality. 2. Atrophy and chronic ischemic microangiopathy. Electronically Signed   By: Ulyses Jarred M.D.   On: 03/03/2018 22:33   Dg Chest Port 1 View  Result Date: 03/04/2018 CLINICAL DATA:  History of stroke and hypertension.  Fever. EXAM: PORTABLE CHEST 1 VIEW COMPARISON:  03/01/2018 and older exams. FINDINGS: Cardiac silhouette is normal in size.  No mediastinal or masses. Clear lungs.  No convincing  pleural effusion.  No pneumothorax. Stable reverse left shoulder prosthesis. No acute skeletal abnormality. IMPRESSION: No acute cardiopulmonary disease. Electronically Signed   By: Lajean Manes M.D.   On: 03/04/2018 09:36   Dg Swallowing Func-speech Pathology  Result Date: 03/03/2018 Objective Swallowing Evaluation: Type of Study: MBS-Modified Barium Swallow Study  Patient Details Name: EUDELL JULIAN MRN: [id number] Date of Birth: October 03, 1932 Today's Date: 03/03/2018 Time: SLP Start Time (ACUTE ONLY): 1013 -SLP Stop Time (ACUTE ONLY): 1030 SLP Time Calculation (min) (ACUTE ONLY): 17 min Past Medical History: Past Medical History: Diagnosis Date . Anemia  . Atrial fibrillation (HCC)   Paroxysmal, Flecainide therapy . Calf pain   September, 2012, at rest . Carotid bruit   Doppler, December, 2009, no abnormality . Cataract  . Diverticulosis  . GERD (gastroesophageal reflux disease)  . History of shingles  . HLD (hyperlipidemia)  . HTN (hypertension)  . Hypothyroidism  . Insomnia  . Osteoarthritis  . PONV (postoperative nausea and vomiting)  . Rectal bleeding 2001  diverticulosis and int hemorrhoids on 07/1999 and 02/2010 colonoscopies. . Seizures ([place])  . Stroke ([place])  . Warfarin anticoagulation  Past Surgical History: Past Surgical History: Procedure Laterality Date . BIOPSY  09/29/2015  Procedure: BIOPSY;  Surgeon: Rogene Houston, MD;  [facility];  Service: Endoscopy;;  gastric . CATARACT EXTRACTION W/PHACO Right 04/15/2014  Procedure: CATARACT EXTRACTION PHACO AND INTRAOCULAR LENS PLACEMENT (IOC);  Surgeon: Tonny Branch, MD;  Location: [facility] OR[facility];  Service: Ophthalmology;  Laterality: Right;  CDE: 13.30 . CATARACT EXTRACTION W/PHACO Left 05/13/2014  Procedure: CATARACT EXTRACTION PHACO AND INTRAOCULAR LENS PLACEMENT LEFT EYE;  Surgeon: Tonny Branch, MD;  Location: [facility];  Service: Ophthalmology;  Laterality: Left;  CDE:13.00 . CHOLECYSTECTOMY N/A 01/15/2015  Procedure: LAPAROSCOPIC CHOLECYSTECTOMY;  Surgeon:  Ralene Ok, MD;  Location: [facility];  Service: General;  Laterality: N/A; . COLONOSCOPY N/A 09/29/2015  Procedure: COLONOSCOPY;  Surgeon: Rogene Houston, MD;  [facility];  Service: Endoscopy;  Laterality: N/A; . COLONOSCOPY   . ESOPHAGOGASTRODUODENOSCOPY N/A 01/13/2015  Procedure: ESOPHAGOGASTRODUODENOSCOPY (EGD);  Surgeon: Jerene Bears, MD;  [facility] END[facility];  Service: Endoscopy;  Laterality: N/A; . ESOPHAGOGASTRODUODENOSCOPY N/A 09/29/2015  Procedure: ESOPHAGOGASTRODUODENOSCOPY (EGD);  Surgeon: Rogene Houston, MD;  [facility];  Service: Endoscopy;  Laterality: N/A;  12:15 . GALLBLADDER SURGERY   . LEFT HEART CATH AND CORONARY ANGIOGRAPHY N/A 06/22/2016  Procedure: Left Heart Cath and Coronary Angiography;  Surgeon: Troy Sine, MD;  Location: [facility] LAB;  Service: Cardiovascular;  Laterality: N/A; . SHOULDER OPEN ROTATOR CUFF REPAIR    bilateral . TOTAL KNEE ARTHROPLASTY  05/26/10  right . TOTAL KNEE ARTHROPLASTY  11/17/2010  Procedure: TOTAL KNEE ARTHROPLASTY;  Surgeon: Mauri Pole;  Location: [facility];  Service: Orthopedics;  Laterality: Left; . TOTAL KNEE ARTHROPLASTY    Right . TOTAL SHOULDER REPLACEMENT  01/2010  left HPI: [name] is a 85 y.o. female with medical history significant for paroxysmal A. Fib/atrial flutter, diastolic CHF, HTN, CVA, dementia, who presented to the ED after reports of initial confusion earlier today, patient's aide was with her, and reports that it appeared patient did not know how to use her walker, kept walking into the walls with a walker, her speech did not make sense also, this began at about 1030 today.  It was also noted that she was not moving her left side-about 1:30 PM.  Patient was also still confused.  Patients niece reports significant jecking of all patient's extremities, she is adamant and sure it was not tremors, reports her husband has Parkinson's and she knows the difference. BSE requested  Subjective: "Mmhmmm."  Assessment / Plan / Recommendation CHL IP CLINICAL IMPRESSIONS 03/03/2018 Clinical Impression Pt exhibited minimal oral dysphagia with discoordination of control and transit primarily with thin. Protective mechanisms including hyoid excursion, epiglottic retroflexion, arytenoid cartilidges approximation to epiglottic petiole were functional. Flash laryngeal penetration with straw present which is not considered abnormal. No significant residue and esophageal scan unremarkable (MBS only diagnosis above level of UES). Recommend regular texture, thin liquids, pills whole in applesauce, straws allowed and full supervision initially. ST will briefly follow.   SLP Visit Diagnosis Dysphagia, oral phase (R13.11) Attention and concentration deficit following -- Frontal lobe and executive function deficit following -- Impact on safety and function Mild aspiration risk   CHL IP TREATMENT RECOMMENDATION 03/03/2018 Treatment Recommendations Therapy as outlined in treatment plan below   Prognosis 03/03/2018 Prognosis for Safe Diet Advancement Good Barriers to Reach Goals -- Barriers/Prognosis Comment -- CHL IP DIET RECOMMENDATION 03/03/2018 SLP Diet Recommendations Regular solids;Thin liquid Liquid Administration via Cup;Straw Medication Administration Whole meds with puree Compensations Slow rate;Small sips/bites Postural Changes Seated upright at 90 degrees   CHL IP OTHER RECOMMENDATIONS 03/03/2018 Recommended Consults -- Oral Care Recommendations Oral care BID Other Recommendations --   CHL IP FOLLOW UP RECOMMENDATIONS 03/03/2018 Follow up Recommendations Skilled Nursing facility   [facility] IP FREQUENCY AND DURATION 03/03/2018 Speech Therapy Frequency (ACUTE ONLY) min 2x/week Treatment Duration 1 week      CHL IP ORAL PHASE 03/03/2018 Oral Phase Impaired Oral - Pudding Teaspoon -- Oral - Pudding Cup -- Oral - Honey Teaspoon -- Oral - Honey Cup -- Oral - Nectar Teaspoon -- Oral - Nectar Cup -- Oral - Nectar Straw -- Oral - Thin Teaspoon --  Oral - Thin Cup Reduced posterior propulsion Oral - Thin Straw Reduced posterior propulsion Oral - Puree -- Oral - Mech Soft -- Oral - Regular WFL Oral - Multi-Consistency -- Oral - Pill -- Oral Phase - Comment --  CHL IP PHARYNGEAL PHASE 03/03/2018 Pharyngeal Phase Impaired Pharyngeal- Pudding Teaspoon -- Pharyngeal -- Pharyngeal- Pudding Cup --  Pharyngeal -- Pharyngeal- Honey Teaspoon -- Pharyngeal -- Pharyngeal- Honey Cup -- Pharyngeal -- Pharyngeal- Nectar Teaspoon -- Pharyngeal -- Pharyngeal- Nectar Cup -- Pharyngeal -- Pharyngeal- Nectar Straw -- Pharyngeal -- Pharyngeal- Thin Teaspoon -- Pharyngeal -- Pharyngeal- Thin Cup WFL Pharyngeal -- Pharyngeal- Thin Straw Penetration/Aspiration during swallow Pharyngeal Material enters airway, remains ABOVE vocal cords then ejected out Pharyngeal- Puree -- Pharyngeal -- Pharyngeal- Mechanical Soft -- Pharyngeal -- Pharyngeal- Regular WFL Pharyngeal -- Pharyngeal- Multi-consistency -- Pharyngeal -- Pharyngeal- Pill -- Pharyngeal -- Pharyngeal Comment --  CHL IP CERVICAL ESOPHAGEAL PHASE 03/03/2018 Cervical Esophageal Phase (No Data) Pudding Teaspoon -- Pudding Cup -- Honey Teaspoon -- Honey Cup -- Nectar Teaspoon -- Nectar Cup -- Nectar Straw -- Thin Teaspoon -- Thin Cup -- Thin Straw -- Puree -- Mechanical Soft -- Regular -- Multi-consistency -- Pill -- Cervical Esophageal Comment -- [place] Siren 03/03/2018, 1:36 PM Orbie Pyo Litaker M.Ed Actor Pager [phone] Office [phone]                   Scheduled Meds: . atorvastatin  20 mg Oral QHS  . digoxin  0.125 mg Oral q morning - 10a  . donepezil  5 mg Oral QHS  . enoxaparin (LOVENOX) injection  1 mg/kg Subcutaneous Q12H  . flecainide  50 mg Oral BID  . levothyroxine  50 mcg Oral QAC breakfast  . mouth rinse  15 mL Mouth Rinse BID  . metoprolol tartrate  5 mg Intravenous Q8H   Continuous Infusions: . sodium chloride 75 mL/hr at 03/04/18 1111  . ceFEPime (MAXIPIME) IV  2 g (03/03/18 2354)  . levETIRAcetam 750 mg (03/03/18 1631)  . vancomycin     Followed by  . [START ON 03/05/2018] vancomycin       LOS: 1 day    Time spent: 35 minutes    Elmarie Shiley, MD [facility]  03/04/2018, 12:05 PM

## 2018-03-04 NOTE — NC FL2 (Signed)
[place] MEDICAID FL2 LEVEL OF CARE SCREENING TOOL     IDENTIFICATION  Patient Name: [name] Birthdate: 1932-03-22 Sex: female Admission Date (Current Location): 03/01/2018  [facility] and [place] Number:  [employer] and Address:  [facility]. [facility], [place] [address] [place], [place] [address]      Provider Number: [id number]  Attending Physician Name and Address:  Elmarie Shiley, MD  Relative Name and Phone Number:  [name], son,   [phone]     Current Level of Care: Hospital Recommended Level of Care: [place] Prior Approval Number:    Date Approved/Denied:   PASRR Number: [id number]  Discharge Plan: SNF    Current Diagnoses: Patient Active Problem List   Diagnosis Date Noted  . Seizure ([place]) 03/02/2018  . Acute encephalopathy 03/02/2018  . Left-sided weakness 03/01/2018  . Aphasia 01/30/2018  . CVA (cerebral vascular accident) ([place]) 01/30/2018  . History of Clostridioides difficile colitis 12/23/2017  . Mild dementia ([place]) 12/23/2017  . History of TIA (transient ischemic attack) 03/15/2017  . Osteoporosis 02/24/2017  . Prolonged QT interval 01/28/2017  . Unstable angina ([place]) 06/16/2016  . Atrial flutter ([place]) 06/16/2016  . Overactive bladder 05/02/2015  . History of CVA (cerebrovascular accident) 04/21/2015  . History of dysarthria 02/20/2015  . HTN (hypertension) 01/11/2015  . Hyponatremia 01/11/2015  . Chronic pain syndrome 11/25/2014  . Insomnia 11/25/2014  . Chronic diastolic CHF (congestive heart failure) ([place]) 11/12/2014  . Paroxysmal atrial fibrillation ([place]) 10/14/2014  . Warfarin anticoagulation   . Hypothyroidism   . Carotid bruit   . S/P knee replacement   . DIVERTICULOSIS-COLON 01/19/2010    Orientation RESPIRATION BLADDER Height & Weight     Self  Normal Incontinent Weight: 57.2 kg Height:  5\' 2"  (157.5 cm)  BEHAVIORAL SYMPTOMS/MOOD NEUROLOGICAL BOWEL NUTRITION STATUS  (Dementia,  no behaviors noted) Convulsions/Seizures Continent Diet(Please see DC Summary)  AMBULATORY STATUS COMMUNICATION OF NEEDS Skin   Extensive Assist Verbally Normal                       Personal Care Assistance Level of Assistance  Bathing, Feeding, Dressing Bathing Assistance: Maximum assistance Feeding assistance: Limited assistance Dressing Assistance: Limited assistance     Functional Limitations Info  Sight, Hearing, Speech Sight Info: Adequate Hearing Info: Adequate Speech Info: Adequate    SPECIAL CARE FACTORS FREQUENCY  PT (By licensed PT), OT (By licensed OT)     PT Frequency: 5x/week OT Frequency: 3x/week            Contractures Contractures Info: Not present    Additional Factors Info  Code Status, Allergies, Isolation Precautions Code Status Info: Partial Allergies Info: Penicillins     Isolation Precautions Info: Droplet precautions Pending further testing (negative for influenza)     Current Medications (03/04/2018):  This is the current hospital active medication list Current Facility-Administered Medications  Medication Dose Route Frequency Provider Last Rate Last Dose  . 0.9 %  sodium chloride infusion   Intravenous Continuous Regalado, Belkys A, MD 75 mL/hr at 03/04/18 1111    . acetaminophen (TYLENOL) suppository 650 mg  650 mg Rectal Q4H PRN Emokpae, Ejiroghene E, MD   650 mg at 03/04/18 0605  . atorvastatin (LIPITOR) tablet 20 mg  20 mg Oral QHS Regalado, Belkys A, MD   20 mg at 03/04/18 0005  . ceFEPIme (MAXIPIME) 2 g in sodium chloride 0.9 % 100 mL IVPB  2 g Intravenous Q24H  Kathie Dike, MD 200 mL/hr at 03/03/18 2354 2 g at 03/03/18 2354  . digoxin (LANOXIN) tablet 0.125 mg  0.125 mg Oral q morning - 10a Regalado, Belkys A, MD   0.125 mg at 03/03/18 1240  . donepezil (ARICEPT) tablet 5 mg  5 mg Oral QHS Regalado, Belkys A, MD   5 mg at 03/04/18 0005  . enoxaparin (LOVENOX) injection 55 mg  1 mg/kg Subcutaneous Q12H Kerney Elbe, MD   55 mg  at 03/04/18 0750  . flecainide (TAMBOCOR) tablet 50 mg  50 mg Oral BID Regalado, Belkys A, MD   50 mg at 03/04/18 0005  . hydrALAZINE (APRESOLINE) injection 10 mg  10 mg Intravenous Q8H PRN Regalado, Belkys A, MD   10 mg at 03/04/18 0036  . levETIRAcetam (KEPPRA) 750 mg in sodium chloride 0.9 % 100 mL IVPB  750 mg Intravenous Q12H [place], Kofi, MD 430 mL/hr at 03/03/18 1631 750 mg at 03/03/18 1631  . levothyroxine (SYNTHROID, LEVOTHROID) tablet 50 mcg  50 mcg Oral QAC breakfast Regalado, Belkys A, MD   50 mcg at 03/04/18 0748  . MEDLINE mouth rinse  15 mL Mouth Rinse BID Emokpae, Ejiroghene E, MD   15 mL at 03/04/18 1029  . metoprolol tartrate (LOPRESSOR) injection 5 mg  5 mg Intravenous Q8H Regalado, Belkys A, MD   5 mg at 03/04/18 1122  . ondansetron (ZOFRAN) tablet 4 mg  4 mg Oral Q8H PRN Emokpae, Ejiroghene E, MD      . vancomycin (VANCOCIN) IVPB 1000 mg/200 mL premix  1,000 mg Intravenous Once Ronna Polio, RPH 200 mL/hr at 03/04/18 1311 1,000 mg at 03/04/18 1311   Followed by  . [START ON 03/05/2018] vancomycin (VANCOCIN) 1,250 mg in sodium chloride 0.9 % 250 mL IVPB  1,250 mg Intravenous Q24H Ronna Polio, [facility]         Discharge Medications: Please see discharge summary for a list of discharge medications.  Relevant Imaging Results:  Relevant Lab Results:   Additional Information SS# [id number]  Benard Halsted, LCSW

## 2018-03-04 NOTE — Progress Notes (Addendum)
Pharmacy Antibiotic Note  LATRAVIA SOUTHGATE is a 85 y.o. female admitted on 03/01/2018 with pneumonia.  Pharmacy has been consulted for vancomycin and cefepime dosing.  WBC 8.5, T=101.1  Plan: Vancomycin 1,000 mg IV x 1 followed by Vancomycin 1,250 mg IV Q 24 hrs. Goal AUC 400-550., Expected AUC: 518.7 SCr used: 0.58  Continue cefepime 2 g IV Q 24 hrs.  Height: 5\' 2"  (157.5 cm) Weight: 126 lb 1.7 oz (57.2 kg) IBW/kg (Calculated) : 50.1  Temp (24hrs), Avg:98.9 F (37.2 C), Min:97.6 F (36.4 C), Max:101.1 F (38.4 C)  Recent Labs  Lab 03/01/18 1353 03/01/18 1410 03/01/18 2058 03/02/18 0056 03/02/18 0842 03/04/18 0754  WBC 7.8  --   --   --  8.4 8.5  CREATININE  --  0.54  --   --  0.49  --   LATICACIDVEN  --   --  2.0* 1.1  --   --     Estimated Creatinine Clearance: 41.4 mL/min (by C-G formula based on SCr of 0.49 mg/dL).    Allergies  Allergen Reactions  . Penicillins Rash and Other (See Comments)    Has patient had a PCN reaction causing immediate rash, facial/tongue/throat swelling, SOB or lightheadedness with hypotension: No Has patient had a PCN reaction causing severe rash involving mucus membranes or skin necrosis: No Has patient had a PCN reaction that required hospitalization No Has patient had a PCN reaction occurring within the last 10 years: No If all of the above answers are "NO", then may proceed with Cephalosporin use.    Antimicrobials this admission: Cefepime 2/27 >>  Vancomycin 2/27 x 1, 2/29 >>   Dose adjustments this admission: none  Microbiology results: 2/26 BCx: ngtd 2/26 UCx: multiple species, suggest recollection 2/29 BCx: sent 2/29 UCx: sent  Thank you for allowing pharmacy to be a part of this patient's care.  Vertis Kelch, PharmD PGY1 Pharmacy Resident Phone [phone] 03/04/2018       9:09 AM

## 2018-03-04 NOTE — Progress Notes (Addendum)
I met with the patient son at bedside and obtain detailed history. Patient has been having off-and-on neurological symptoms now for a few months. She did have a convulsive looking episode this time but brought her into [facility] but she has been gradually worsening in terms of her cognition.  There was mention of mild dementia, but the son says that the patient is unable to distinguish or operate things Monday into daily living such as telephones and remote controls and this has been going on for a few months. It is my suspicion that her current neurological condition is an acute decline in her cognitive status in the setting of an acute hospitalization. Given the history of dementia, I think the dementia is rapidly progressing or has been progressive but has now come to the fore and this acute to subacute setting. I am not inclined to perform any invasive tests such as spinal tap.  I am also not very inclined for performing an LTM EEG at this time  Of note, the son also says that the patient might be more lethargic ever since starting Keppra.  She is also on cefepime. There have been enough documented cases of cefepime induced encephalopathy, although I am not sure that her current EEG pattern exhibits that or not but I would recommend the primary team look into changing cefepime into another antibiotic.  As far as the 80s are concerned, I will discontinue Keppra and started on Dilantin.  We will continue to follow   -- Amie Portland, MD [facility] Pager: [phone] If 7pm to 7am, please call on call as listed on AMION.

## 2018-03-05 ENCOUNTER — Inpatient Hospital Stay (HOSPITAL_COMMUNITY): Payer: Medicare Other

## 2018-03-05 DIAGNOSIS — I4819 Other persistent atrial fibrillation: Secondary | ICD-10-CM

## 2018-03-05 LAB — CBC
HCT: 44.8 % (ref 36.0–46.0)
Hemoglobin: 15.1 g/dL — ABNORMAL HIGH (ref 12.0–15.0)
MCH: 30.1 pg (ref 26.0–34.0)
MCHC: 33.7 g/dL (ref 30.0–36.0)
MCV: 89.4 fL (ref 80.0–100.0)
Platelets: 272 10*3/uL (ref 150–400)
RBC: 5.01 MIL/uL (ref 3.87–5.11)
RDW: 14.8 % (ref 11.5–15.5)
WBC: 9.8 10*3/uL (ref 4.0–10.5)
nRBC: 0 % (ref 0.0–0.2)

## 2018-03-05 LAB — BASIC METABOLIC PANEL
Anion gap: 13 (ref 5–15)
BUN: 13 mg/dL (ref 8–23)
CO2: 23 mmol/L (ref 22–32)
Calcium: 8.3 mg/dL — ABNORMAL LOW (ref 8.9–10.3)
Chloride: 96 mmol/L — ABNORMAL LOW (ref 98–111)
Creatinine, Ser: 0.64 mg/dL (ref 0.44–1.00)
GFR calc Af Amer: >60 mL/min (ref >60)
GFR calc non Af Amer: >60 mL/min (ref >60)
GLUCOSE: 108 mg/dL — AB (ref 70–99)
Potassium: 3.8 mmol/L (ref 3.5–5.1)
Sodium: 132 mmol/L — ABNORMAL LOW (ref 135–145)

## 2018-03-05 LAB — URINE CULTURE: Culture: NO GROWTH

## 2018-03-05 LAB — PHENYTOIN LEVEL, TOTAL: Phenytoin Lvl: 10.2 ug/mL (ref 10.0–20.0)

## 2018-03-05 MED ORDER — ENALAPRILAT 1.25 MG/ML IV SOLN
1.2500 mg | Freq: Once | INTRAVENOUS | Status: AC
  Administered 2018-03-05: 1.25 mg via INTRAVENOUS
  Filled 2018-03-05: qty 1

## 2018-03-05 MED ORDER — SODIUM CHLORIDE 0.9 % IV BOLUS
500.0000 mL | Freq: Once | INTRAVENOUS | Status: AC
  Administered 2018-03-05: 500 mL via INTRAVENOUS

## 2018-03-05 MED ORDER — METOPROLOL SUCCINATE ER 100 MG PO TB24
100.0000 mg | ORAL_TABLET | Freq: Every morning | ORAL | Status: DC
  Administered 2018-03-05 – 2018-03-07 (×3): 100 mg via ORAL
  Filled 2018-03-05 (×3): qty 1

## 2018-03-05 MED ORDER — SODIUM CHLORIDE 0.9 % IV SOLN
1.0000 g | INTRAVENOUS | Status: DC
  Administered 2018-03-05 – 2018-03-07 (×3): 1 g via INTRAVENOUS
  Filled 2018-03-05 (×4): qty 10

## 2018-03-05 MED ORDER — IOPAMIDOL (ISOVUE-300) INJECTION 61%
100.0000 mL | Freq: Once | INTRAVENOUS | Status: AC | PRN
  Administered 2018-03-05: 100 mL via INTRAVENOUS

## 2018-03-05 MED ORDER — DILTIAZEM HCL-DEXTROSE 100-5 MG/100ML-% IV SOLN (PREMIX)
5.0000 mg/h | INTRAVENOUS | Status: DC
  Administered 2018-03-05: 5 mg/h via INTRAVENOUS
  Administered 2018-03-05: 7.5 mg/h via INTRAVENOUS
  Filled 2018-03-05 (×3): qty 100

## 2018-03-05 NOTE — Progress Notes (Signed)
ANTICOAGULATION CONSULT NOTE - Initial Consult  Pharmacy Consult for edoxaban at discharge Indication: atrial fibrillation  Allergies  Allergen Reactions  . Penicillins Rash and Other (See Comments)    Has patient had a PCN reaction causing immediate rash, facial/tongue/throat swelling, SOB or lightheadedness with hypotension: No Has patient had a PCN reaction causing severe rash involving mucus membranes or skin necrosis: No Has patient had a PCN reaction that required hospitalization No Has patient had a PCN reaction occurring within the last 10 years: No If all of the above answers are "NO", then may proceed with Cephalosporin use.    Patient Measurements: Height: 5\' 2"  (157.5 cm) Weight: 126 lb 1.7 oz (57.2 kg) IBW/kg (Calculated) : 50.1   Vital Signs: Temp: 98.5 F (36.9 C) (03/01 0816) Temp Source: Axillary (03/01 0816) BP: 152/91 (03/01 0816) Pulse Rate: 65 (02/29 2321)  Labs: Recent Labs    03/02/18 1458 03/04/18 0754 03/05/18 0504  HGB  --  14.4 15.1*  HCT  --  42.6 44.8  PLT  --  283 272  CREATININE  --  0.58 0.64  TROPONINI <0.03  --   --     Estimated Creatinine Clearance: 41.4 mL/min (by C-G formula based on SCr of 0.64 mg/dL).   Medical History: Past Medical History:  Diagnosis Date  . Anemia   . Atrial fibrillation (HCC)    Paroxysmal, Flecainide therapy  . Calf pain    September, 2012, at rest  . Carotid bruit    Doppler, December, 2009, no abnormality  . Cataract   . Diverticulosis   . GERD (gastroesophageal reflux disease)   . History of shingles   . HLD (hyperlipidemia)   . HTN (hypertension)   . Hypothyroidism   . Insomnia   . Osteoarthritis   . PONV (postoperative nausea and vomiting)   . Rectal bleeding 2001   diverticulosis and int hemorrhoids on 07/1999 and 02/2010 colonoscopies.  . Seizures ([place])   . Stroke ([place])   . Warfarin anticoagulation     Assessment: 85 yo F on Eliquis PTA for afib. Patient has started phenytoin  during this admission and concurrent use of Eliquis with phenytoin should be avoided. Pharmacy has been consulted to start edoxaban on discharge since there is a less concern with potential drug-drug interactions with this agent. Patient will continue with Lovenox dosing per MD during admission.   Plan:  Edoxaban 30 mg PO daily on discharge May need to adjust if any acute changes in renal function Do not reorder Eliquis  Vertis Kelch, PharmD PGY1 Pharmacy Resident Phone [phone] 03/05/2018       9:32 AM

## 2018-03-05 NOTE — Progress Notes (Addendum)
Neurology Progress Note  S:// Seen and examined. More awake this AM than in the past.  O:// Current vital signs: BP (!) 152/91 (BP Location: Left Arm)   Pulse 65   Temp 98.5 F (36.9 C) (Axillary)   Resp 17   Ht 5\' 2"  (1.575 m)   Wt 57.2 kg   SpO2 96%   BMI 23.06 kg/m  Vital signs in last 24 hours: Temp:  [98.5 F (36.9 C)-100.5 F (38.1 C)] 98.5 F (36.9 C) (03/01 0816) Pulse Rate:  [65-81] 65 (02/29 2321) Resp:  [17] 17 (03/01 0816) BP: (112-172)/(62-91) 152/91 (03/01 0816) SpO2:  [96 %] 96 % (03/01 0816) Gen: Awake alert in no distrss HEENT: [place] AT MMM CVS: irregularly irregular, tachycardic to the 160s Resp: CTABL Ext: no edema NEUROLOGICAL Awake, alert, oriented to self, Able to follow commands Speech is clear Naming, repetition and fluency are intact Cranial: Pupils equal round reactive light, extraocular wounds intact, visual fields full, facial sensations intact light touch, face is symmetric, auditory acuity is mildly reduced bilaterally, palate midline, shoulder shrug intact, tongue midline. Motor: 5/5 in both upper extremities.  Deconditioned but antigravity in both lower extremities without drift. Normal tone.  Decreased range of motion in both lower extremities. Sensory exam: Intact to light touch all over.  No extinction Coordination: Intact finger-nose-finger test Gait testing was deferred at this time.  Medications  Current Facility-Administered Medications:  .  0.9 %  sodium chloride infusion, , Intravenous, Continuous, Regalado, Belkys A, MD, Last Rate: 75 mL/hr at 03/04/18 2310 .  acetaminophen (TYLENOL) suppository 650 mg, 650 mg, Rectal, Q4H PRN, Emokpae, Ejiroghene E, MD, 650 mg at 03/04/18 0605 .  atorvastatin (LIPITOR) tablet 20 mg, 20 mg, Oral, QHS, Regalado, Belkys A, MD, 20 mg at 03/04/18 2254 .  cefTRIAXone (ROCEPHIN) 1 g in sodium chloride 0.9 % 100 mL IVPB, 1 g, Intravenous, Q24H, Regalado, Belkys A, MD .  Chlorhexidine Gluconate Cloth 2  % PADS 6 each, 6 each, Topical, Q0600, Regalado, Belkys A, MD, 6 each at 03/05/18 0533 .  digoxin (LANOXIN) tablet 0.125 mg, 0.125 mg, Oral, q morning - 10a, Regalado, Belkys A, MD, 0.125 mg at 03/03/18 1240 .  diltiazem (CARDIZEM) 100 mg in dextrose 5% 145mL (1 mg/mL) infusion, 5-15 mg/hr, Intravenous, Titrated, Regalado, Belkys A, MD .  donepezil (ARICEPT) tablet 5 mg, 5 mg, Oral, QHS, Regalado, Belkys A, MD, 5 mg at 03/04/18 2255 .  enoxaparin (LOVENOX) injection 55 mg, 1 mg/kg, Subcutaneous, Q12H, Kerney Elbe, MD, 55 mg at 03/05/18 0741 .  flecainide (TAMBOCOR) tablet 50 mg, 50 mg, Oral, BID, Regalado, Belkys A, MD, 50 mg at 03/04/18 2256 .  hydrALAZINE (APRESOLINE) injection 10 mg, 10 mg, Intravenous, Q8H PRN, Regalado, Belkys A, MD, 10 mg at 03/05/18 0355 .  levothyroxine (SYNTHROID, LEVOTHROID) tablet 50 mcg, 50 mcg, Oral, QAC breakfast, Regalado, Belkys A, MD, 50 mcg at 03/05/18 0533 .  MEDLINE mouth rinse, 15 mL, Mouth Rinse, BID, Emokpae, Ejiroghene E, MD, 15 mL at 03/04/18 2258 .  metoprolol succinate (TOPROL-XL) 24 hr tablet 100 mg, 100 mg, Oral, q morning - 10a, Regalado, Belkys A, MD, 100 mg at 03/05/18 0848 .  mupirocin ointment (BACTROBAN) 2 % 1 application, 1 application, Nasal, BID, Regalado, Belkys A, MD, 1 application at 16/10/96 2254 .  ondansetron (ZOFRAN) tablet 4 mg, 4 mg, Oral, Q8H PRN, Emokpae, Ejiroghene E, MD .  phenytoin (DILANTIN) ER capsule 100 mg, 100 mg, Oral, TID, Amie Portland, MD, 100 mg at 03/04/18  2258 .  sodium chloride 0.9 % bolus 500 mL, 500 mL, Intravenous, Once, Regalado, Belkys A, MD .  [COMPLETED] vancomycin (VANCOCIN) IVPB 1000 mg/200 mL premix, 1,000 mg, Intravenous, Once, Stopped at 03/05/18 0755 **FOLLOWED BY** vancomycin (VANCOCIN) 1,250 mg in sodium chloride 0.9 % 250 mL IVPB, 1,250 mg, Intravenous, Q24H, Ronna Polio, [facility]  Labs CBC    Component Value Date/Time   WBC 9.8 03/05/2018 0504   RBC 5.01 03/05/2018 0504   HGB 15.1 (H)  03/05/2018 0504   HGB 11.8 06/02/2017 1113   HCT 44.8 03/05/2018 0504   HCT 34.7 06/02/2017 1113   PLT 272 03/05/2018 0504   PLT 275 06/02/2017 1113   MCV 89.4 03/05/2018 0504   MCV 91 06/02/2017 1113   MCH 30.1 03/05/2018 0504   MCHC 33.7 03/05/2018 0504   RDW 14.8 03/05/2018 0504   RDW 14.6 06/02/2017 1113   LYMPHSABS 1.8 03/01/2018 1353   LYMPHSABS 1.0 06/02/2017 1113   MONOABS 0.9 03/01/2018 1353   EOSABS 0.1 03/01/2018 1353   EOSABS 0.1 06/02/2017 1113   BASOSABS 0.0 03/01/2018 1353   BASOSABS 0.0 06/02/2017 1113   CMP     Component Value Date/Time   NA 132 (L) 03/05/2018 0504   NA 137 06/02/2017 1113   K 3.8 03/05/2018 0504   CL 96 (L) 03/05/2018 0504   CO2 23 03/05/2018 0504   GLUCOSE 108 (H) 03/05/2018 0504   BUN 13 03/05/2018 0504   BUN 9 06/02/2017 1113   CREATININE 0.64 03/05/2018 0504   CALCIUM 8.3 (L) 03/05/2018 0504   PROT 6.7 03/01/2018 1410   PROT 6.6 04/19/2017 1533   ALBUMIN 3.7 03/01/2018 1410   ALBUMIN 3.9 04/19/2017 1533   AST 22 03/01/2018 1410   ALT 15 03/01/2018 1410   ALKPHOS 64 03/01/2018 1410   BILITOT 0.5 03/01/2018 1410   BILITOT 0.3 04/19/2017 1533   GFRNONAA >60 03/05/2018 0504   GFRAA >60 03/05/2018 0504   Imaging I have reviewed images in epic and the results pertinent to this consultation are: MRI examination of the brain-no acute changes.  Assessment: 85 year old woman from outside hospital with left-sided weakness and jerking suspected to be due to seizure activity.  She was on Keppra, dose was increased at the outside hospital by outside neurologist.  Patient remained very drowsy.  EEG x2 was significant for some right-sided slowing but no seizure activity. On detailed conversation with the family, she has fairly moderate dementia and I suspect that she had a prolonged postictal phase. Also, I changed her Keppra to Dilantin yesterday and she is much more awake today and nearly back to her baseline. I suspect that Keppra affected  her adversely in terms of her mental status. I would continue on her Dilantin for now. She will need anticoagulation for atrial fibrillation- at home on Eliquis, but Eliquis and phenytoin have adverse interactions with one another.  I discussed this briefly with the pharmacist over the phone as to the choice of best anticoagulant, and edoxaban was recommended in place of apixaban.  On another note, she had some fever, source unclear.  Do not suspect a CNS source hence no LP needed at this time.  But also she is anticoagulated and will require to hold anticoagulation for any such procedure.  Unless CNS infection is very highly suspected, I would not recommend a tap.  Impression: Dementia Seizure activity Toxic metabolic encephalopathy  Recommendations: Pharmacy consult for edoxaban-should be started as outpatient For now continue anticoagulation with Lovenox  Continue phenytoin - pharmacy on board for dosing. Maintain seizure precautions Continue donepezil Management of A. Fib with RVR per primary team and cardiology. Outpatient neurology follow-up- 2-4 weeks from discharge.  -- Amie Portland, MD [facility] Pager: [phone] If 7pm to 7am, please call on call as listed on AMION.

## 2018-03-05 NOTE — Consult Note (Signed)
Cardiology Consultation:   Patient ID: [name] MRN: [id number]; DOB: 02-May-1932  Admit date: 03/01/2018 Date of Consult: 03/05/2018  Primary Care Provider: Dettinger, Fransisca Kaufmann, MD Primary Cardiologist: Minus Breeding, MD   Patient Profile:   [name] is a 85 y.o. female with a hx of persistent atrial fibrillation, hypertension, chronic diastolic CHF, hyperlipidemia and prior TIAs and recent stroke 01/2018 who is being seen today for the evaluation of afib RVR at the request of Dr. Tyrell Antonio.   She had an episode of left-sided focal neurological symptoms 03/2017, at which time it was thought that the patient had a TIA due to subtherapeutic Coumadin.  Last seen by Dr. Percival Spanish 12/21/2017.  She was noted in atrial fibrillation at rate of 121 bpm.  Felt she was persistently in atrial fibrillation since September 2019.  She is on metoprolol, digoxin and flecainide for rate control. 48 hours holter showed persistent atrial fibrillation. Recommended DCCV however last INR was not therapeutic. Recommended one therapeutic INR then TEE/DCCV.   Admitted 1/27-1/31 aphasia/acute encephalopathy secondary to TIA vs seizure. MRI brainshows no evidence of stroke. Normal LVEF by echo. Seen by neurology and increased Keppra to 500mg  BID. Coumadin changed to Eliquis.   History of Present Illness:   [name] presented to [facility] 2/26 with left sided weakness, AMS and seizure activity. CT head with CTA/CTP were negative for acute infarction. Loaded with keppra. Seen by neurologist for generalized tonic-clonic seizures. Keppra increased to 750mg  BID. At [facility] her digoxin, flecainide, metoprolol and Eliquis were held. MRI with no new stroke or lateralized lesions. Was febrile at 101.1. on Lovenox. Her Keppra now changed to Dilantin as it thought it might causing AMS. Plan to change Eliquis to edoxaban at discharge due to interaction with phenytoin. May be LP this admission.   In regards to afib, Digoxin given  2/28>> held since then. On Flecainide 50mg  BID. On Toprol XL 100mg  daily (given IV yesterday due to AMS).   Past Medical History:  Diagnosis Date  . Anemia   . Atrial fibrillation (HCC)    Paroxysmal, Flecainide therapy  . Calf pain    September, 2012, at rest  . Carotid bruit    Doppler, December, 2009, no abnormality  . Cataract   . Diverticulosis   . GERD (gastroesophageal reflux disease)   . History of shingles   . HLD (hyperlipidemia)   . HTN (hypertension)   . Hypothyroidism   . Insomnia   . Osteoarthritis   . PONV (postoperative nausea and vomiting)   . Rectal bleeding 2001   diverticulosis and int hemorrhoids on 07/1999 and 02/2010 colonoscopies.  . Seizures ([place])   . Stroke ([place])   . Warfarin anticoagulation     Past Surgical History:  Procedure Laterality Date  . BIOPSY  09/29/2015   Procedure: BIOPSY;  Surgeon: Rogene Houston, MD;  Location: [facility];  Service: Endoscopy;;  gastric  . CATARACT EXTRACTION W/PHACO Right 04/15/2014   Procedure: CATARACT EXTRACTION PHACO AND INTRAOCULAR LENS PLACEMENT (IOC);  Surgeon: Tonny Branch, MD;  Location: [facility];  Service: Ophthalmology;  Laterality: Right;  CDE: 13.30  . CATARACT EXTRACTION W/PHACO Left 05/13/2014   Procedure: CATARACT EXTRACTION PHACO AND INTRAOCULAR LENS PLACEMENT LEFT EYE;  Surgeon: Tonny Branch, MD;  Location: [facility];  Service: Ophthalmology;  Laterality: Left;  CDE:13.00  . CHOLECYSTECTOMY N/A 01/15/2015   Procedure: LAPAROSCOPIC CHOLECYSTECTOMY;  Surgeon: Ralene Ok, MD;  Location: [place];  Service: General;  Laterality: N/A;  . COLONOSCOPY  N/A 09/29/2015   Procedure: COLONOSCOPY;  Surgeon: Rogene Houston, MD;  [facility];  Service: Endoscopy;  Laterality: N/A;  . COLONOSCOPY    . ESOPHAGOGASTRODUODENOSCOPY N/A 01/13/2015   Procedure: ESOPHAGOGASTRODUODENOSCOPY (EGD);  Surgeon: Jerene Bears, MD;  Location[facility] ENDOSCOPY;  Service: Endoscopy;  Laterality: N/A;  . ESOPHAGOGASTRODUODENOSCOPY N/A  09/29/2015   Procedure: ESOPHAGOGASTRODUODENOSCOPY (EGD);  Surgeon: Rogene Houston, MD;  Location: [facility];  Service: Endoscopy;  Laterality: N/A;  12:15  . GALLBLADDER SURGERY    . LEFT HEART CATH AND CORONARY ANGIOGRAPHY N/A 06/22/2016   Procedure: Left Heart Cath and Coronary Angiography;  Surgeon: Troy Sine, MD;  Location: [place] [facility];  Service: Cardiovascular;  Laterality: N/A;  . SHOULDER OPEN ROTATOR CUFF REPAIR     bilateral  . TOTAL KNEE ARTHROPLASTY  05/26/10   right  . TOTAL KNEE ARTHROPLASTY  11/17/2010   Procedure: TOTAL KNEE ARTHROPLASTY;  Surgeon: Mauri Pole;  Location: [facility];  Service: Orthopedics;  Laterality: Left;  . TOTAL KNEE ARTHROPLASTY     Right  . TOTAL SHOULDER REPLACEMENT  01/2010   left     Inpatient Medications: Scheduled Meds: . atorvastatin  20 mg Oral QHS  . Chlorhexidine Gluconate Cloth  6 each Topical Q0600  . digoxin  0.125 mg Oral q morning - 10a  . donepezil  5 mg Oral QHS  . enoxaparin (LOVENOX) injection  1 mg/kg Subcutaneous Q12H  . flecainide  50 mg Oral BID  . levothyroxine  50 mcg Oral QAC breakfast  . mouth rinse  15 mL Mouth Rinse BID  . metoprolol succinate  100 mg Oral q morning - 10a  . mupirocin ointment  1 application Nasal BID  . phenytoin  100 mg Oral TID   Continuous Infusions: . sodium chloride 75 mL/hr at 03/04/18 2310  . cefTRIAXone (ROCEPHIN)  IV    . diltiazem (CARDIZEM) infusion    . sodium chloride    . vancomycin     PRN Meds: acetaminophen, hydrALAZINE, ondansetron  Allergies:    Allergies  Allergen Reactions  . Penicillins Rash and Other (See Comments)    Has patient had a PCN reaction causing immediate rash, facial/tongue/throat swelling, SOB or lightheadedness with hypotension: No Has patient had a PCN reaction causing severe rash involving mucus membranes or skin necrosis: No Has patient had a PCN reaction that required hospitalization No Has patient had a PCN reaction occurring  within the last 10 years: No If all of the above answers are "NO", then may proceed with Cephalosporin use.    Social History:   Social History   Socioeconomic History  . Marital status: Widowed    Spouse name: Not on file  . Number of children: 1  . Years of education: Not on file  . Highest education level: Not on file  Occupational History  . Occupation: retired    Comment: self - hair-dresser  Social Needs  . Financial resource strain: Not on file  . Food insecurity:    Worry: Not on file    Inability: Not on file  . Transportation needs:    Medical: Not on file    Non-medical: Not on file  Tobacco Use  . Smoking status: Never Smoker  . Smokeless tobacco: Never Used  Substance and Sexual Activity  . Alcohol use: No  . Drug use: No  . Sexual activity: Never  Lifestyle  . Physical activity:    Days per week: Not  on file    Minutes per session: Not on file  . Stress: Not on file  Relationships  . Social connections:    Talks on phone: Not on file    Gets together: Not on file    Attends religious service: Not on file    Active member of club or organization: Not on file    Attends meetings of clubs or organizations: Not on file    Relationship status: Not on file  . Intimate partner violence:    Fear of current or ex partner: Not on file    Emotionally abused: Not on file    Physically abused: Not on file    Forced sexual activity: Not on file  Other Topics Concern  . Not on file  Social History Narrative  . Not on file    Family History:    Family History  Problem Relation Age of Onset  . COPD Brother   . Atrial fibrillation Brother   . Diabetes Brother   . Liver cancer Brother        Alcohol-related  . Colon cancer Brother   . Heart attack Father   . Early death Father   . Heart disease Father   . Rheum arthritis Mother   . Arthritis Mother   . Heart disease Mother   . Coronary artery disease Brother   . Heart attack Daughter   . Gallbladder  disease Brother      ROS:  Please see the history of present illness.  Difficult due to dementia.  She denies chest pain, dyspnea or palpitations.  She has mild lower abdominal pain. All other ROS reviewed and negative.     Physical Exam/Data:   Vitals:   03/04/18 2100 03/04/18 2321 03/05/18 0355 03/05/18 0816  BP: 112/62 126/62 (!) 172/86 (!) 152/91  Pulse: 81 65    Resp:    17  Temp:  98.6 F (37 C)  98.5 F (36.9 C)  TempSrc:  Axillary  Axillary  SpO2: 96% 96%  96%  Weight:      Height:       No intake or output data in the 24 hours ending 03/05/18 1011 Last 3 Weights 03/01/2018 03/01/2018 02/17/2018  Weight (lbs) 126 lb 1.7 oz 131 lb 4 oz 131 lb  Weight (kg) 57.2 kg 59.535 kg 59.421 kg     Body mass index is 23.06 kg/m.  General:  Well nourished, well developed, in no acute distress HEENT: normal Lymph: no adenopathy Neck: no JVD Endocrine:  No thryomegaly Vascular: No carotid bruits; FA pulses 2+ bilaterally without bruits  Cardiac:  normal S1, S2; irregular and tachycardic Lungs:  clear to auscultation bilaterally, no wheezing, rhonchi or rales  Abd: soft, nontender, no hepatomegaly  Ext: no edema Musculoskeletal:  No deformities, BUE and BLE strength normal and equal Skin: warm and dry  Neuro: Confused; no focal abnormalities noted   EKG:  The EKG was personally reviewed and demonstrates:  Atrial fibrillation with RVR; CRO SMI; nonspecific ST changes Telemetry:  Telemetry was personally reviewed and demonstrates:  Atrial fibrillation with RVR  Laboratory Data:  Chemistry Recent Labs  Lab 03/02/18 0842 03/04/18 0754 03/05/18 0504  NA 134* 129* 132*  K 3.8 3.5 3.8  CL 98 92* 96*  CO2 27 22 23   GLUCOSE 96 82 108*  BUN 11 14 13   CREATININE 0.49 0.58 0.64  CALCIUM 8.4* 8.3* 8.3*  GFRNONAA >60 >60 >60  GFRAA >60 >60 >60  ANIONGAP 9  15 13    Recent Labs  Lab 03/01/18 1410  PROT 6.7  ALBUMIN 3.7  AST 22  ALT 15  ALKPHOS 64  BILITOT 0.5    Hematology Recent Labs  Lab 03/02/18 0842 03/04/18 0754 03/05/18 0504  WBC 8.4 8.5 9.8  RBC 4.24 4.76 5.01  HGB 13.0 14.4 15.1*  HCT 40.5 42.6 44.8  MCV 95.5 89.5 89.4  MCH 30.7 30.3 30.1  MCHC 32.1 33.8 33.7  RDW 15.4 14.8 14.8  PLT 256 283 272   Cardiac Enzymes Recent Labs  Lab 03/02/18 0244 03/02/18 0842 03/02/18 1458  TROPONINI 0.05* 0.04* <0.03   Radiology/Studies:  Ct Angio Head W Or Wo Contrast  Result Date: 03/01/2018 CLINICAL DATA:  Left-sided weakness.  Confusion.  Unsteady gait. EXAM: CT ANGIOGRAPHY HEAD AND NECK CT PERFUSION BRAIN TECHNIQUE: Multidetector CT imaging of the head and neck was performed using the standard protocol during bolus administration of intravenous contrast. Multiplanar CT image reconstructions and MIPs were obtained to evaluate the vascular anatomy. Carotid stenosis measurements (when applicable) are obtained utilizing NASCET criteria, using the distal internal carotid diameter as the denominator. Multiphase CT imaging of the brain was performed following IV bolus contrast injection. Subsequent parametric perfusion maps were calculated using RAPID software. CONTRAST:  139mL ISOVUE-370 IOPAMIDOL (ISOVUE-370) INJECTION 76% COMPARISON:  Head and neck CTA 01/30/2018. FINDINGS: CTA NECK FINDINGS Aortic arch: Standard 3 vessel aortic arch with mild atherosclerotic plaque. Widely patent arch vessel origins. Right carotid system: Patent with mild atherosclerotic plaque at the carotid bifurcation. No evidence of significant stenosis or dissection. Tortuous mid cervical ICA with kinked appearance. Left carotid system: Patent without evidence of significant stenosis or dissection. Tortuous mid cervical ICA with kinked appearance. Vertebral arteries: Patent and codominant without evidence of significant stenosis or dissection although motion artifact limits assessment of the proximal left V1 segment (this was widely patent on the prior CTA however). Skeleton:  Moderate cervical disc degeneration with neural foraminal stenosis due to uncovertebral spurring most notable on the left at C4-5 and bilaterally at C5-6. Other neck: No mass or enlarged lymph nodes identified. Upper chest: Clear lung apices. Review of the MIP images confirms the above findings CTA HEAD FINDINGS Anterior circulation: The internal carotid arteries are widely patent from skull base to carotid termini with mild atherosclerotic plaque noted. A prominent right anterior choroidal artery is incidentally noted. ACAs and MCAs are patent without evidence of proximal branch occlusion or significant proximal stenosis. No aneurysm is identified. Posterior circulation: The intracranial vertebral arteries are widely patent to the basilar. Patent PICA and SCA origins are visualized bilaterally. The basilar artery is widely patent. Posterior communicating arteries are diminutive or absent. PCAs are patent without evidence of significant stenosis. No aneurysm is identified. Venous sinuses: Patent. Anatomic variants: None. Review of the MIP images confirms the above findings CT Brain Perfusion Findings: CBF (<30%) Volume: 28mL Perfusion (Tmax>6.0s) volume: 27mL Mismatch Volume: 66mL Infarction Location:No core infarct identified by RAPID. The Tmax volume is present in the right cerebral hemisphere primarily in the MCA territory, however perfusion imaging is significantly motion degraded and multiple data points were discarded casting doubt on the reliability of this data. IMPRESSION: 1. Mild atherosclerosis in the head and neck without evidence of large vessel occlusion or significant stenosis. 2. Motion degraded perfusion imaging of questionable reliability as detailed above. No core infarct identified. These results were called by telephone at the time of interpretation on 03/01/2018 at 3:45 pm to Dr. Dayna Barker, who verbally acknowledged these  results. Electronically Signed   By: Logan Bores M.D.   On: 03/01/2018 16:03     Ct Angio Neck W Or Wo Contrast  Result Date: 03/01/2018 CLINICAL DATA:  Left-sided weakness.  Confusion.  Unsteady gait. EXAM: CT ANGIOGRAPHY HEAD AND NECK CT PERFUSION BRAIN TECHNIQUE: Multidetector CT imaging of the head and neck was performed using the standard protocol during bolus administration of intravenous contrast. Multiplanar CT image reconstructions and MIPs were obtained to evaluate the vascular anatomy. Carotid stenosis measurements (when applicable) are obtained utilizing NASCET criteria, using the distal internal carotid diameter as the denominator. Multiphase CT imaging of the brain was performed following IV bolus contrast injection. Subsequent parametric perfusion maps were calculated using RAPID software. CONTRAST:  175mL ISOVUE-370 IOPAMIDOL (ISOVUE-370) INJECTION 76% COMPARISON:  Head and neck CTA 01/30/2018. FINDINGS: CTA NECK FINDINGS Aortic arch: Standard 3 vessel aortic arch with mild atherosclerotic plaque. Widely patent arch vessel origins. Right carotid system: Patent with mild atherosclerotic plaque at the carotid bifurcation. No evidence of significant stenosis or dissection. Tortuous mid cervical ICA with kinked appearance. Left carotid system: Patent without evidence of significant stenosis or dissection. Tortuous mid cervical ICA with kinked appearance. Vertebral arteries: Patent and codominant without evidence of significant stenosis or dissection although motion artifact limits assessment of the proximal left V1 segment (this was widely patent on the prior CTA however). Skeleton: Moderate cervical disc degeneration with neural foraminal stenosis due to uncovertebral spurring most notable on the left at C4-5 and bilaterally at C5-6. Other neck: No mass or enlarged lymph nodes identified. Upper chest: Clear lung apices. Review of the MIP images confirms the above findings CTA HEAD FINDINGS Anterior circulation: The internal carotid arteries are widely patent from skull base to  carotid termini with mild atherosclerotic plaque noted. A prominent right anterior choroidal artery is incidentally noted. ACAs and MCAs are patent without evidence of proximal branch occlusion or significant proximal stenosis. No aneurysm is identified. Posterior circulation: The intracranial vertebral arteries are widely patent to the basilar. Patent PICA and SCA origins are visualized bilaterally. The basilar artery is widely patent. Posterior communicating arteries are diminutive or absent. PCAs are patent without evidence of significant stenosis. No aneurysm is identified. Venous sinuses: Patent. Anatomic variants: None. Review of the MIP images confirms the above findings CT Brain Perfusion Findings: CBF (<30%) Volume: 43mL Perfusion (Tmax>6.0s) volume: 51mL Mismatch Volume: 16mL Infarction Location:No core infarct identified by RAPID. The Tmax volume is present in the right cerebral hemisphere primarily in the MCA territory, however perfusion imaging is significantly motion degraded and multiple data points were discarded casting doubt on the reliability of this data. IMPRESSION: 1. Mild atherosclerosis in the head and neck without evidence of large vessel occlusion or significant stenosis. 2. Motion degraded perfusion imaging of questionable reliability as detailed above. No core infarct identified. These results were called by telephone at the time of interpretation on 03/01/2018 at 3:45 pm to Dr. Dayna Barker, who verbally acknowledged these results. Electronically Signed   By: Logan Bores M.D.   On: 03/01/2018 16:03   Mr Brain Wo Contrast  Result Date: 03/03/2018 CLINICAL DATA:  Altered mental status EXAM: MRI HEAD WITHOUT CONTRAST TECHNIQUE: Multiplanar, multiecho pulse sequences of the brain and surrounding structures were obtained without intravenous contrast. COMPARISON:  CTA head neck 03/01/2018 FINDINGS: BRAIN: There is no acute infarct, acute hemorrhage, hydrocephalus or extra-axial collection. The  midline structures are normal. No midline shift or other mass effect. Diffuse confluent hyperintense T2-weighted signal within the periventricular,  deep and juxtacortical white matter, most commonly due to chronic ischemic microangiopathy. Generalized atrophy without lobar predilection. Susceptibility-sensitive sequences show no chronic microhemorrhage or superficial siderosis. Atrophic appearance of both medial temporal lobes. VASCULAR: Major intracranial arterial and venous sinus flow voids are normal. SKULL AND UPPER CERVICAL SPINE: Calvarial bone marrow signal is normal. There is no skull base mass. Visualized upper cervical spine and soft tissues are normal. SINUSES/ORBITS: No fluid levels or advanced mucosal thickening. No mastoid or middle ear effusion. The orbits are normal. IMPRESSION: 1. No acute intracranial abnormality. 2. Atrophy and chronic ischemic microangiopathy. Electronically Signed   By: Ulyses Jarred M.D.   On: 03/03/2018 22:33   Ct Abdomen Pelvis W Contrast  Result Date: 03/05/2018 CLINICAL DATA:  Fever of unknown origin. EXAM: CT ABDOMEN AND PELVIS WITH CONTRAST TECHNIQUE: Multidetector CT imaging of the abdomen and pelvis was performed using the standard protocol following bolus administration of intravenous contrast. CONTRAST:  172mL ISOVUE-300 IOPAMIDOL (ISOVUE-300) INJECTION 61% COMPARISON:  12/18/2017 FINDINGS: Lower chest: Linear atelectasis in the lung bases. Cardiac enlargement with particular right heart enlargement. Coronary artery calcifications. Small esophageal hiatal hernia. Hepatobiliary: Gallbladder is surgically absent. Intra and extrahepatic bile duct dilatation is likely postoperative. No focal liver lesions. Pancreas: Unremarkable. No pancreatic ductal dilatation or surrounding inflammatory changes. Spleen: 1.6 cm cyst in the upper pole of the spleen. Spleen size is normal. Adrenals/Urinary Tract: No adrenal gland nodules. Cyst in the upper pole right kidney. Nephrograms  are homogeneous and symmetrical. No hydronephrosis or hydroureter. Bladder is distended but no wall thickening or filling defects are identified. Stomach/Bowel: Stomach, small bowel, and colon are not abnormally distended. There is a small diverticulum off of the posterior gastric cardia. Diverticulosis of the sigmoid colon with sigmoid colonic wall thickening similar to prior study, likely due to muscular hypertrophy. No definite evidence of diverticulitis. Residual contrast material in the colon. Appendix is normal. Vascular/Lymphatic: Aortic atherosclerosis. No enlarged abdominal or pelvic lymph nodes. Reproductive: Uterus and bilateral adnexa are unremarkable. Other: No free air or free fluid in the abdomen. Abdominal wall musculature appears intact. Musculoskeletal: Degenerative changes in the spine. No destructive bone lesions. IMPRESSION: 1. No acute process demonstrated in the abdomen or pelvis. No evidence of bowel obstruction or inflammation. 2. Diverticulosis of the sigmoid colon with sigmoid colonic wall thickening likely due to muscular hypertrophy. 3. Small esophageal hiatal hernia. 4. Small gastric diverticulum. 5. Cardiac enlargement. 6. Intra and extrahepatic bile duct dilatation is likely postoperative after cholecystectomy. Electronically Signed   By: Lucienne Capers M.D.   On: 03/05/2018 03:30   Ct Cerebral Perfusion W Contrast  Result Date: 03/01/2018 CLINICAL DATA:  Left-sided weakness.  Confusion.  Unsteady gait. EXAM: CT ANGIOGRAPHY HEAD AND NECK CT PERFUSION BRAIN TECHNIQUE: Multidetector CT imaging of the head and neck was performed using the standard protocol during bolus administration of intravenous contrast. Multiplanar CT image reconstructions and MIPs were obtained to evaluate the vascular anatomy. Carotid stenosis measurements (when applicable) are obtained utilizing NASCET criteria, using the distal internal carotid diameter as the denominator. Multiphase CT imaging of the  brain was performed following IV bolus contrast injection. Subsequent parametric perfusion maps were calculated using RAPID software. CONTRAST:  115mL ISOVUE-370 IOPAMIDOL (ISOVUE-370) INJECTION 76% COMPARISON:  Head and neck CTA 01/30/2018. FINDINGS: CTA NECK FINDINGS Aortic arch: Standard 3 vessel aortic arch with mild atherosclerotic plaque. Widely patent arch vessel origins. Right carotid system: Patent with mild atherosclerotic plaque at the carotid bifurcation. No evidence of significant stenosis or dissection.  Tortuous mid cervical ICA with kinked appearance. Left carotid system: Patent without evidence of significant stenosis or dissection. Tortuous mid cervical ICA with kinked appearance. Vertebral arteries: Patent and codominant without evidence of significant stenosis or dissection although motion artifact limits assessment of the proximal left V1 segment (this was widely patent on the prior CTA however). Skeleton: Moderate cervical disc degeneration with neural foraminal stenosis due to uncovertebral spurring most notable on the left at C4-5 and bilaterally at C5-6. Other neck: No mass or enlarged lymph nodes identified. Upper chest: Clear lung apices. Review of the MIP images confirms the above findings CTA HEAD FINDINGS Anterior circulation: The internal carotid arteries are widely patent from skull base to carotid termini with mild atherosclerotic plaque noted. A prominent right anterior choroidal artery is incidentally noted. ACAs and MCAs are patent without evidence of proximal branch occlusion or significant proximal stenosis. No aneurysm is identified. Posterior circulation: The intracranial vertebral arteries are widely patent to the basilar. Patent PICA and SCA origins are visualized bilaterally. The basilar artery is widely patent. Posterior communicating arteries are diminutive or absent. PCAs are patent without evidence of significant stenosis. No aneurysm is identified. Venous sinuses:  Patent. Anatomic variants: None. Review of the MIP images confirms the above findings CT Brain Perfusion Findings: CBF (<30%) Volume: 72mL Perfusion (Tmax>6.0s) volume: 59mL Mismatch Volume: 33mL Infarction Location:No core infarct identified by RAPID. The Tmax volume is present in the right cerebral hemisphere primarily in the MCA territory, however perfusion imaging is significantly motion degraded and multiple data points were discarded casting doubt on the reliability of this data. IMPRESSION: 1. Mild atherosclerosis in the head and neck without evidence of large vessel occlusion or significant stenosis. 2. Motion degraded perfusion imaging of questionable reliability as detailed above. No core infarct identified. These results were called by telephone at the time of interpretation on 03/01/2018 at 3:45 pm to Dr. Dayna Barker, who verbally acknowledged these results. Electronically Signed   By: Logan Bores M.D.   On: 03/01/2018 16:03   Dg Chest Port 1 View  Result Date: 03/04/2018 CLINICAL DATA:  History of stroke and hypertension.  Fever. EXAM: PORTABLE CHEST 1 VIEW COMPARISON:  03/01/2018 and older exams. FINDINGS: Cardiac silhouette is normal in size.  No mediastinal or masses. Clear lungs.  No convincing pleural effusion.  No pneumothorax. Stable reverse left shoulder prosthesis. No acute skeletal abnormality. IMPRESSION: No acute cardiopulmonary disease. Electronically Signed   By: Lajean Manes M.D.   On: 03/04/2018 09:36   Dg Chest Port 1 View  Result Date: 03/01/2018 CLINICAL DATA:  Fever EXAM: PORTABLE CHEST 1 VIEW COMPARISON:  06/23/2017 FINDINGS: The heart size and mediastinal contours are within normal limits. Right basilar atelectasis. No pleural effusion or focal consolidation. Left total shoulder arthroplasty. The visualized skeletal structures are unremarkable. IMPRESSION: No active disease. Electronically Signed   By: Ulyses Jarred M.D.   On: 03/01/2018 23:20   Dg Swallowing Func-speech  Pathology  Result Date: 03/03/2018 Objective Swallowing Evaluation: Type of Study: MBS-Modified Barium Swallow Study  Patient Details Name: [name] MRN: [id number] Date of Birth: December 08, 1932 Today's Date: 03/03/2018 Time: SLP Start Time (ACUTE ONLY): 1013 -SLP Stop Time (ACUTE ONLY): 1030 SLP Time Calculation (min) (ACUTE ONLY): 17 min Past Medical History: Past Medical History: Diagnosis Date . Anemia  . Atrial fibrillation (HCC)   Paroxysmal, Flecainide therapy . Calf pain   September, 2012, at rest . Carotid bruit   Doppler, December, 2009, no abnormality . Cataract  . Diverticulosis  .  GERD (gastroesophageal reflux disease)  . History of shingles  . HLD (hyperlipidemia)  . HTN (hypertension)  . Hypothyroidism  . Insomnia  . Osteoarthritis  . PONV (postoperative nausea and vomiting)  . Rectal bleeding 2001  diverticulosis and int hemorrhoids on 07/1999 and 02/2010 colonoscopies. . Seizures ([place])  . Stroke ([place])  . Warfarin anticoagulation  Past Surgical History: Past Surgical History: Procedure Laterality Date . BIOPSY  09/29/2015  Procedure: BIOPSY;  Surgeon: Rogene Houston, MD;  Location: [facility];  Service: Endoscopy;;  gastric . CATARACT EXTRACTION W/PHACO Right 04/15/2014  Procedure: CATARACT EXTRACTION PHACO AND INTRAOCULAR LENS PLACEMENT (IOC);  Surgeon: Tonny Branch, MD;  Location: [facility];  Service: Ophthalmology;  Laterality: Right;  CDE: 13.30 . CATARACT EXTRACTION W/PHACO Left 05/13/2014  Procedure: CATARACT EXTRACTION PHACO AND INTRAOCULAR LENS PLACEMENT LEFT EYE;  Surgeon: Tonny Branch, MD;  Location: [facility];  Service: Ophthalmology;  Laterality: Left;  CDE:13.00 . CHOLECYSTECTOMY N/A 01/15/2015  Procedure: LAPAROSCOPIC CHOLECYSTECTOMY;  Surgeon: Ralene Ok, MD;  Location: [place];  Service: General;  Laterality: N/A; . COLONOSCOPY N/A 09/29/2015  Procedure: COLONOSCOPY;  Surgeon: Rogene Houston, MD;  [facility]: [facility];  Service: Endoscopy;  Laterality: N/A; . COLONOSCOPY   .  ESOPHAGOGASTRODUODENOSCOPY N/A 01/13/2015  Procedure: ESOPHAGOGASTRODUODENOSCOPY (EGD);  Surgeon: Jerene Bears, MD;  Location[facility];  Service: Endoscopy;  Laterality: N/A; . ESOPHAGOGASTRODUODENOSCOPY N/A 09/29/2015  Procedure: ESOPHAGOGASTRODUODENOSCOPY (EGD);  Surgeon: Rogene Houston, MD;  Location: [facility];  Service: Endoscopy;  Laterality: N/A;  12:15 . GALLBLADDER SURGERY   . LEFT HEART CATH AND CORONARY ANGIOGRAPHY N/A 06/22/2016  Procedure: Left Heart Cath and Coronary Angiography;  Surgeon: Troy Sine, MD;  Location: [place] [facility];  Service: Cardiovascular;  Laterality: N/A; . SHOULDER OPEN ROTATOR CUFF REPAIR    bilateral . TOTAL KNEE ARTHROPLASTY  05/26/10  right . TOTAL KNEE ARTHROPLASTY  11/17/2010  Procedure: TOTAL KNEE ARTHROPLASTY;  Surgeon: Mauri Pole;  Location: [facility];  Service: Orthopedics;  Laterality: Left; . TOTAL KNEE ARTHROPLASTY    Right . TOTAL SHOULDER REPLACEMENT  01/2010  left HPI: TYIANA HILL is a 85 y.o. female with medical history significant for paroxysmal A. Fib/atrial flutter, diastolic CHF, HTN, CVA, dementia, who presented to the ED after reports of initial confusion earlier today, patient's aide was with her, and reports that it appeared patient did not know how to use her walker, kept walking into the walls with a walker, her speech did not make sense also, this began at about 1030 today.  It was also noted that she was not moving her left side-about 1:30 PM.  Patient was also still confused.  Patients niece reports significant jecking of all patient's extremities, she is adamant and sure it was not tremors, reports her husband has Parkinson's and she knows the difference. BSE requested  Subjective: "Mmhmmm." Assessment / Plan / Recommendation CHL IP CLINICAL IMPRESSIONS 03/03/2018 Clinical Impression Pt exhibited minimal oral dysphagia with discoordination of control and transit primarily with thin. Protective mechanisms including hyoid excursion,  epiglottic retroflexion, arytenoid cartilidges approximation to epiglottic petiole were functional. Flash laryngeal penetration with straw present which is not considered abnormal. No significant residue and esophageal scan unremarkable (MBS only diagnosis above level of UES). Recommend regular texture, thin liquids, pills whole in applesauce, straws allowed and full supervision initially. ST will briefly follow.   SLP Visit Diagnosis Dysphagia, oral phase (R13.11) Attention and concentration deficit following -- Frontal lobe and executive function deficit  following -- Impact on safety and function Mild aspiration risk   CHL IP TREATMENT RECOMMENDATION 03/03/2018 Treatment Recommendations Therapy as outlined in treatment plan below   Prognosis 03/03/2018 Prognosis for Safe Diet Advancement Good Barriers to Reach Goals -- Barriers/Prognosis Comment -- CHL IP DIET RECOMMENDATION 03/03/2018 SLP Diet Recommendations Regular solids;Thin liquid Liquid Administration via Cup;Straw Medication Administration Whole meds with puree Compensations Slow rate;Small sips/bites Postural Changes Seated upright at 90 degrees   CHL IP OTHER RECOMMENDATIONS 03/03/2018 Recommended Consults -- Oral Care Recommendations Oral care BID Other Recommendations --   CHL IP FOLLOW UP RECOMMENDATIONS 03/03/2018 Follow up Recommendations Skilled Nursing facility   [facility] IP FREQUENCY AND DURATION 03/03/2018 Speech Therapy Frequency (ACUTE ONLY) min 2x/week Treatment Duration 1 week      CHL IP ORAL PHASE 03/03/2018 Oral Phase Impaired Oral - Pudding Teaspoon -- Oral - Pudding Cup -- Oral - Honey Teaspoon -- Oral - Honey Cup -- Oral - Nectar Teaspoon -- Oral - Nectar Cup -- Oral - Nectar Straw -- Oral - Thin Teaspoon -- Oral - Thin Cup Reduced posterior propulsion Oral - Thin Straw Reduced posterior propulsion Oral - Puree -- Oral - Mech Soft -- Oral - Regular WFL Oral - Multi-Consistency -- Oral - Pill -- Oral Phase - Comment --  CHL IP PHARYNGEAL PHASE  03/03/2018 Pharyngeal Phase Impaired Pharyngeal- Pudding Teaspoon -- Pharyngeal -- Pharyngeal- Pudding Cup -- Pharyngeal -- Pharyngeal- Honey Teaspoon -- Pharyngeal -- Pharyngeal- Honey Cup -- Pharyngeal -- Pharyngeal- Nectar Teaspoon -- Pharyngeal -- Pharyngeal- Nectar Cup -- Pharyngeal -- Pharyngeal- Nectar Straw -- Pharyngeal -- Pharyngeal- Thin Teaspoon -- Pharyngeal -- Pharyngeal- Thin Cup WFL Pharyngeal -- Pharyngeal- Thin Straw Penetration/Aspiration during swallow Pharyngeal Material enters airway, remains ABOVE vocal cords then ejected out Pharyngeal- Puree -- Pharyngeal -- Pharyngeal- Mechanical Soft -- Pharyngeal -- Pharyngeal- Regular WFL Pharyngeal -- Pharyngeal- Multi-consistency -- Pharyngeal -- Pharyngeal- Pill -- Pharyngeal -- Pharyngeal Comment --  CHL IP CERVICAL ESOPHAGEAL PHASE 03/03/2018 Cervical Esophageal Phase (No Data) Pudding Teaspoon -- Pudding Cup -- Honey Teaspoon -- Honey Cup -- Nectar Teaspoon -- Nectar Cup -- Nectar Straw -- Thin Teaspoon -- Thin Cup -- Thin Straw -- Puree -- Mechanical Soft -- Regular -- Multi-consistency -- Pill -- Cervical Esophageal Comment -- [place] Siren 03/03/2018, 1:36 PM Orbie Pyo Litaker M.Ed Actor Pager [phone] Office [phone]              Ct Head Code Stroke Wo Contrast  Result Date: 03/01/2018 CLINICAL DATA:  Code stroke. Left-sided weakness. Left facial droop. EXAM: CT HEAD WITHOUT CONTRAST TECHNIQUE: Contiguous axial images were obtained from the base of the skull through the vertex without intravenous contrast. COMPARISON:  MRI 01/30/2018, CT head 01/30/2018 FINDINGS: Brain: Negative for acute infarct.  Negative for hemorrhage or mass Generalized atrophy. Chronic microvascular ischemic changes in the white matter are moderately advanced since stable. Vascular: Negative for hyperdense vessel Skull: Negative Sinuses/Orbits: Paranasal sinuses clear. Bilateral cataract surgery. Other: None ASPECTS ([place] Stroke  Program Early CT Score) - Ganglionic level infarction (caudate, lentiform nuclei, internal capsule, insula, M1-M3 cortex): 7 - Supraganglionic infarction (M4-M6 cortex): 3 Total score (0-10 with 10 being normal): 10 IMPRESSION: 1. No acute intracranial abnormality. 2. Atrophy and chronic microvascular ischemic changes stable. 3. ASPECTS is 10 4. These results were called by telephone at the time of interpretation on 03/01/2018 at 2:03 pm to Dr. Thurnell Garbe, who verbally acknowledged these results. Electronically Signed   By: Franchot Gallo M.D.  On: 03/01/2018 14:04    Assessment and Plan:   1. See below  For questions or updates, please contact [place] Please consult [email or web address] for contact info under     SignedLeanor Kail, PA  03/05/2018 10:11 AM   As above, patient seen and examined.  Briefly she is an 85 year old female with past medical history of persistent atrial fibrillation, dementia, hypertension, prior TIA, seizures admitted with seizure activity and metabolic encephalopathy for evaluation of atrial fibrillation.  Dr. Percival Spanish follows the patient and last saw her in December.  Last echocardiogram January 2020 showed normal LV function, biatrial enlargement, mild mitral regurgitation.  Holter monitor January 2020 showed atrial fibrillation with elevated rate during the day.  Patient admitted with left-sided weakness and altered mental status.  She has been seen by nephrology and is now on Dilantin for seizure activity.  MRI shows no acute CVA.  Noted to have elevated heart rate with atrial fibrillation and cardiology asked to evaluate.  She is confused at the time of evaluation.  She denies dyspnea, chest pain or palpitations.  She is alert and oriented to person only.  She does move all extremities.  1 persistent atrial fibrillation-patient remains in atrial fibrillation and her heart rate is elevated at times.  She was on Toprol and digoxin at time of admission.  We will  continue with these at present dose.  IV Cardizem has been added at low-dose.  Follow heart rate and adjust AV nodal blocking agents as needed. CHADSvasc 6.  Issue of anticoagulation is complicated.  She appears to have confusion, dementia and seizure activity.  This all makes anticoagulation high risk.  However based on records she has had a prior CVA and would like to decrease risk of recurrent event.  Continue Lovenox for now.  If mental status improves would treat with edoxaban at DC (interaction of apixaban with phenytoin).  Best strategy is likely rate control and anticoagulation as she appears to be asymptomatic.  2 seizure activity-neurology following.  Management per primary care.  3 hypertension-continue present blood pressure medications and adjust regimen as needed.  4 fever-Per primary care.  5 dementia  Kirk Ruths, MD

## 2018-03-05 NOTE — Progress Notes (Signed)
patient is to follow-up with out patient neurology after discharge. Referral ( per family request) has been made for Dr. Delice Lesch  And is  in [place].  Laurey Morale, MSN, NP-C [facility] [phone]

## 2018-03-05 NOTE — Progress Notes (Signed)
PROGRESS NOTE    [name]  GYJ:[id number] DOB: [birth year] DOA: 03/01/2018 PCP: Dettinger, Fransisca Kaufmann, MD   Brief Narrative: 85 year old with past medical history significant for paroxysmal A. fib, diastolic heart failure, dementia, hypertension, brought to the hospital with left side weakness, confusion, concern for seizure, she was also having some shakiness.  She received IV Keppra and Ativan in the emergency department.  CT of the brain did not show any new findings.  Patient remained lethargic after admission.  Assessment & Plan:   Active Problems:   Hypothyroidism   Paroxysmal atrial fibrillation (HCC)   HTN (hypertension)   Left-sided weakness   Seizure ([place])   Acute encephalopathy  1-Seizure; presents with confusion, left side weakness, shaking.  EEG; right hemispheric slowing.  MRI negative for Stroke.  Control BP.  Keppra was discontinue due to sedative.  Patient was a started on Dilantin.  She has been tolerating Dilantin so far.  She is more alert this morning.   2-Acute metabolic encephalopathy;  Related to seizure, dictation Keppra. Controlled BP.  Improved, more alert   3-PAF;  On metoprolol, fleicanide, digoxin. On lovenox.  HR elevated today. Started Cardizem Gtt. Cardiology consulted.  On lovenox. She will need on edoxaban.   4-Hypothyroidism;  Resume synthroid. TSH elevated. Check Free T 3 and T 4 normal. .   5-HTN; resume metoprolol. Add hydralazine.   6-Dementia; resume aricept.   7-Fever; influenza negative,  Chest x ray negative for PNA.  Resume vancomycin.  Stop cefepime and ceftriaxone.  Blood culture no growth to date.  Respiratory panel negative.  CT abdomen negative for acute infection. Repeated blood cultures on 2/29 pending. UA clean.  Chest  x-ray negative.  8-Hyponatremia; continue with IV fluids  Estimated body mass index is 23.06 kg/m as calculated from the following:   Height as of this encounter: 5\' 2"  (1.575 m).   Weight  as of this encounter: 57.2 kg.   DVT prophylaxis: lovenox Code Status: full code Family Communication: son at bedside.  Disposition Plan: remain in the hospital, for evaluation of encephalopathy   Consultants:   neurology   Procedures:   none  Antimicrobials:  cefepime Subjective;  More alert, answering questions. Denies abdominal pain.  Deinies chest pain.   Objective: Vitals:   03/04/18 2100 03/04/18 2321 03/05/18 0355 03/05/18 0816  BP: 112/62 126/62 (!) 172/86 (!) 152/91  Pulse: 81 65    Resp:    17  Temp:  98.6 F (37 C)  98.5 F (36.9 C)  TempSrc:  Axillary  Axillary  SpO2: 96% 96%  96%  Weight:      Height:       No intake or output data in the 24 hours ending 03/05/18 0843 Filed Weights   03/01/18 1358 03/01/18 1937  Weight: 59.5 kg 57.2 kg    Examination:  General exam: NAD Respiratory system: CTA Cardiovascular system:S 1, S 2 RRR Gastrointestinal system:BS present, soft, nt Central nervous system: alert, follows command.  Extremities: no edema  Data Reviewed: I have personally reviewed following labs and imaging studies  CBC: Recent Labs  Lab 03/01/18 1353 03/02/18 0842 03/04/18 0754 03/05/18 0504  WBC 7.8 8.4 8.5 9.8  NEUTROABS 4.9  --   --   --   HGB 12.8 13.0 14.4 15.1*  HCT 40.5 40.5 42.6 44.8  MCV 93.5 95.5 89.5 89.4  PLT 271 256 283 263   Basic Metabolic Panel: Recent Labs  Lab 03/01/18 1410 03/02/18 0244 03/02/18 0842 03/04/18  5956 03/05/18 0504  NA 133*  --  134* 129* 132*  K 3.8  --  3.8 3.5 3.8  CL 99  --  98 92* 96*  CO2 25  --  27 22 23   GLUCOSE 161*  --  96 82 108*  BUN 17  --  11 14 13   CREATININE 0.54  --  0.49 0.58 0.64  CALCIUM 8.6*  --  8.4* 8.3* 8.3*  MG  --  1.9  --   --   --    GFR: Estimated Creatinine Clearance: 41.4 mL/min (by C-G formula based on SCr of 0.64 mg/dL). Liver Function Tests: Recent Labs  Lab 03/01/18 1410  AST 22  ALT 15  ALKPHOS 64  BILITOT 0.5  PROT 6.7  ALBUMIN 3.7    No results for input(s): LIPASE, AMYLASE in the last 168 hours. Recent Labs  Lab 03/03/18 0803  AMMONIA 10   Coagulation Profile: Recent Labs  Lab 03/01/18 1410  INR 1.1   Cardiac Enzymes: Recent Labs  Lab 03/02/18 0244 03/02/18 0842 03/02/18 1458  TROPONINI 0.05* 0.04* <0.03   BNP (last 3 results) No results for input(s): PROBNP in the last 8760 hours. HbA1C: No results for input(s): HGBA1C in the last 72 hours. CBG: Recent Labs  Lab 03/01/18 1403 03/03/18 0431 03/04/18 1045  GLUCAP 167* 75 86   Lipid Profile: No results for input(s): CHOL, HDL, LDLCALC, TRIG, CHOLHDL, LDLDIRECT in the last 72 hours. Thyroid Function Tests: Recent Labs    03/03/18 0803 03/04/18 0648  TSH 7.189*  --   FREET4  --  1.63   Anemia Panel: Recent Labs    03/03/18 0803  VITAMINB12 690   Sepsis Labs: Recent Labs  Lab 03/01/18 2058 03/02/18 0056  LATICACIDVEN 2.0* 1.1    Recent Results (from the past 240 hour(s))  Culture, Urine     Status: Abnormal   Collection Time: 03/01/18  8:23 PM  Result Value Ref Range Status   Specimen Description   Final    URINE, RANDOM Performed at [facility], [address], [place], [place] [address]    Special Requests   Final    NONE Performed at [facility], [address], [place], [place] [address]    [place], SUGGEST RECOLLECTION (A)  Final   Report Status 03/03/2018 FINAL  Final  Culture, blood (routine x 2)     Status: None (Preliminary result)   Collection Time: 03/01/18  8:56 PM  Result Value Ref Range Status   Specimen Description BLOOD RIGHT ARM  Final   Special Requests   Final    BOTTLES DRAWN AEROBIC AND ANAEROBIC Blood Culture adequate volume   Culture   Final    NO GROWTH 4 DAYS Performed at [facility], [address], [place], [place] [address]    Report Status PENDING  Incomplete  Culture, blood (routine x 2)     Status: None (Preliminary result)   Collection Time: 03/01/18  8:58 PM   Result Value Ref Range Status   Specimen Description BLOOD RIGHT HAND  Final   Special Requests   Final    BOTTLES DRAWN AEROBIC AND ANAEROBIC Blood Culture adequate volume   Culture   Final    NO GROWTH 4 DAYS Performed at [facility], [address], [place], [place] [address]    Report Status PENDING  Incomplete  Urine Culture     Status: None   Collection Time: 03/04/18  7:23 AM  Result  Value Ref Range Status   Specimen Description URINE, RANDOM  Final   Special Requests NONE  Final   Culture   Final    NO GROWTH Performed at [facility], [address], [place], [place] [address]    Report Status 03/05/2018 FINAL  Final  MRSA PCR Screening     Status: Abnormal   Collection Time: 03/04/18  7:24 AM  Result Value Ref Range Status   MRSA by PCR POSITIVE (A) NEGATIVE Final    Comment:        The GeneXpert MRSA Assay (FDA approved for NASAL specimens only), is one component of a comprehensive MRSA colonization surveillance program. It is not intended to diagnose MRSA infection nor to guide or monitor treatment for MRSA infections. CRITICAL RESULT CALLED TO, READ BACK BY AND VERIFIED WITH: RN Oneida Arenas 0258 [id number] FCP   Respiratory Panel by PCR     Status: None   Collection Time: 03/04/18 12:07 PM  Result Value Ref Range Status   Adenovirus NOT DETECTED NOT DETECTED Final   Coronavirus 229E NOT DETECTED NOT DETECTED Final    Comment: (NOTE) The Coronavirus on the Respiratory Panel, DOES NOT test for the novel  Coronavirus (2019 nCoV)    Coronavirus HKU1 NOT DETECTED NOT DETECTED Final   Coronavirus NL63 NOT DETECTED NOT DETECTED Final   Coronavirus OC43 NOT DETECTED NOT DETECTED Final   Metapneumovirus NOT DETECTED NOT DETECTED Final   Rhinovirus / Enterovirus NOT DETECTED NOT DETECTED Final   Influenza A NOT DETECTED NOT DETECTED Final   Influenza B NOT DETECTED NOT DETECTED Final   Parainfluenza Virus 1 NOT DETECTED NOT DETECTED Final   Parainfluenza  Virus 2 NOT DETECTED NOT DETECTED Final   Parainfluenza Virus 3 NOT DETECTED NOT DETECTED Final   Parainfluenza Virus 4 NOT DETECTED NOT DETECTED Final   Respiratory Syncytial Virus NOT DETECTED NOT DETECTED Final   Bordetella pertussis NOT DETECTED NOT DETECTED Final   Chlamydophila pneumoniae NOT DETECTED NOT DETECTED Final   Mycoplasma pneumoniae NOT DETECTED NOT DETECTED Final    Comment: Performed at [facility], [address] [place], [place] [address]         Radiology Studies: Mr Brain 63 Contrast  Result Date: 03/03/2018 CLINICAL DATA:  Altered mental status EXAM: MRI HEAD WITHOUT CONTRAST TECHNIQUE: Multiplanar, multiecho pulse sequences of the brain and surrounding structures were obtained without intravenous contrast. COMPARISON:  CTA head neck 03/01/2018 FINDINGS: BRAIN: There is no acute infarct, acute hemorrhage, hydrocephalus or extra-axial collection. The midline structures are normal. No midline shift or other mass effect. Diffuse confluent hyperintense T2-weighted signal within the periventricular, deep and juxtacortical white matter, most commonly due to chronic ischemic microangiopathy. Generalized atrophy without lobar predilection. Susceptibility-sensitive sequences show no chronic microhemorrhage or superficial siderosis. Atrophic appearance of both medial temporal lobes. VASCULAR: Major intracranial arterial and venous sinus flow voids are normal. SKULL AND UPPER CERVICAL SPINE: Calvarial bone marrow signal is normal. There is no skull base mass. Visualized upper cervical spine and soft tissues are normal. SINUSES/ORBITS: No fluid levels or advanced mucosal thickening. No mastoid or middle ear effusion. The orbits are normal. IMPRESSION: 1. No acute intracranial abnormality. 2. Atrophy and chronic ischemic microangiopathy. Electronically Signed   By: Ulyses Jarred M.D.   On: 03/03/2018 22:33   Ct Abdomen Pelvis W Contrast  Result Date: 03/05/2018 CLINICAL DATA:   Fever of unknown origin. EXAM: CT ABDOMEN AND PELVIS WITH CONTRAST TECHNIQUE: Multidetector CT imaging of  the abdomen and pelvis was performed using the standard protocol following bolus administration of intravenous contrast. CONTRAST:  158mL ISOVUE-300 IOPAMIDOL (ISOVUE-300) INJECTION 61% COMPARISON:  12/18/2017 FINDINGS: Lower chest: Linear atelectasis in the lung bases. Cardiac enlargement with particular right heart enlargement. Coronary artery calcifications. Small esophageal hiatal hernia. Hepatobiliary: Gallbladder is surgically absent. Intra and extrahepatic bile duct dilatation is likely postoperative. No focal liver lesions. Pancreas: Unremarkable. No pancreatic ductal dilatation or surrounding inflammatory changes. Spleen: 1.6 cm cyst in the upper pole of the spleen. Spleen size is normal. Adrenals/Urinary Tract: No adrenal gland nodules. Cyst in the upper pole right kidney. Nephrograms are homogeneous and symmetrical. No hydronephrosis or hydroureter. Bladder is distended but no wall thickening or filling defects are identified. Stomach/Bowel: Stomach, small bowel, and colon are not abnormally distended. There is a small diverticulum off of the posterior gastric cardia. Diverticulosis of the sigmoid colon with sigmoid colonic wall thickening similar to prior study, likely due to muscular hypertrophy. No definite evidence of diverticulitis. Residual contrast material in the colon. Appendix is normal. Vascular/Lymphatic: Aortic atherosclerosis. No enlarged abdominal or pelvic lymph nodes. Reproductive: Uterus and bilateral adnexa are unremarkable. Other: No free air or free fluid in the abdomen. Abdominal wall musculature appears intact. Musculoskeletal: Degenerative changes in the spine. No destructive bone lesions. IMPRESSION: 1. No acute process demonstrated in the abdomen or pelvis. No evidence of bowel obstruction or inflammation. 2. Diverticulosis of the sigmoid colon with sigmoid colonic wall  thickening likely due to muscular hypertrophy. 3. Small esophageal hiatal hernia. 4. Small gastric diverticulum. 5. Cardiac enlargement. 6. Intra and extrahepatic bile duct dilatation is likely postoperative after cholecystectomy. Electronically Signed   By: Lucienne Capers M.D.   On: 03/05/2018 03:30   Dg Chest Port 1 View  Result Date: 03/04/2018 CLINICAL DATA:  History of stroke and hypertension.  Fever. EXAM: PORTABLE CHEST 1 VIEW COMPARISON:  03/01/2018 and older exams. FINDINGS: Cardiac silhouette is normal in size.  No mediastinal or masses. Clear lungs.  No convincing pleural effusion.  No pneumothorax. Stable reverse left shoulder prosthesis. No acute skeletal abnormality. IMPRESSION: No acute cardiopulmonary disease. Electronically Signed   By: Lajean Manes M.D.   On: 03/04/2018 09:36   Dg Swallowing Func-speech Pathology  Result Date: 03/03/2018 Objective Swallowing Evaluation: Type of Study: MBS-Modified Barium Swallow Study  Patient Details Name: [name] MRN: [id number] Date of Birth: 1932-09-15 Today's Date: 03/03/2018 Time: SLP Start Time (ACUTE ONLY): 1013 -SLP Stop Time (ACUTE ONLY): 1030 SLP Time Calculation (min) (ACUTE ONLY): 17 min Past Medical History: Past Medical History: Diagnosis Date . Anemia  . Atrial fibrillation (HCC)   Paroxysmal, Flecainide therapy . Calf pain   September, 2012, at rest . Carotid bruit   Doppler, December, 2009, no abnormality . Cataract  . Diverticulosis  . GERD (gastroesophageal reflux disease)  . History of shingles  . HLD (hyperlipidemia)  . HTN (hypertension)  . Hypothyroidism  . Insomnia  . Osteoarthritis  . PONV (postoperative nausea and vomiting)  . Rectal bleeding 2001  diverticulosis and int hemorrhoids on 07/1999 and 02/2010 colonoscopies. . Seizures ([place])  . Stroke ([place])  . Warfarin anticoagulation  Past Surgical History: Past Surgical History: Procedure Laterality Date . BIOPSY  09/29/2015  Procedure: BIOPSY;  Surgeon: Rogene Houston, MD;   [facility];  Service: Endoscopy;;  gastric . CATARACT EXTRACTION W/PHACO Right 04/15/2014  Procedure: CATARACT EXTRACTION PHACO AND INTRAOCULAR LENS PLACEMENT (IOC);  Surgeon: Tonny Branch, MD;  Location: [facility];  Service: Ophthalmology;  Laterality: Right;  CDE: 13.30 . CATARACT EXTRACTION W/PHACO Left 05/13/2014  Procedure: CATARACT EXTRACTION PHACO AND INTRAOCULAR LENS PLACEMENT LEFT EYE;  Surgeon: Tonny Branch, MD;  Location: [facility];  Service: Ophthalmology;  Laterality: Left;  CDE:13.00 . CHOLECYSTECTOMY N/A 01/15/2015  Procedure: LAPAROSCOPIC CHOLECYSTECTOMY;  Surgeon: Ralene Ok, MD;  Location: [facility];  Service: General;  Laterality: N/A; . COLONOSCOPY N/A 09/29/2015  Procedure: COLONOSCOPY;  Surgeon: Rogene Houston, MD;  [facility];  Service: Endoscopy;  Laterality: N/A; . COLONOSCOPY   . ESOPHAGOGASTRODUODENOSCOPY N/A 01/13/2015  Procedure: ESOPHAGOGASTRODUODENOSCOPY (EGD);  Surgeon: Jerene Bears, MD;  Location: [facility] ENDOS[facility];  Service: Endoscopy;  Laterality: N/A; . ESOPHAGOGASTRODUODENOSCOPY N/A 09/29/2015  Procedure: ESOPHAGOGASTRODUODENOSCOPY (EGD);  Surgeon: Rogene Houston, MD;  [facility];  Service: Endoscopy;  Laterality: N/A;  12:15 . GALLBLADDER SURGERY   . LEFT HEART CATH AND CORONARY ANGIOGRAPHY N/A 06/22/2016  Procedure: Left Heart Cath and Coronary Angiography;  Surgeon: Troy Sine, MD;  Location: [facility];  Service: Cardiovascular;  Laterality: N/A; . SHOULDER OPEN ROTATOR CUFF REPAIR    bilateral . TOTAL KNEE ARTHROPLASTY  05/26/10  right . TOTAL KNEE ARTHROPLASTY  11/17/2010  Procedure: TOTAL KNEE ARTHROPLASTY;  Surgeon: Mauri Pole;  Location: [facility];  Service: Orthopedics;  Laterality: Left; . TOTAL KNEE ARTHROPLASTY    Right . TOTAL SHOULDER REPLACEMENT  01/2010  left HPI: [name] is a 85 y.o. female with medical history significant for paroxysmal A. Fib/atrial flutter, diastolic CHF, HTN, CVA, dementia, who presented to the ED after  reports of initial confusion earlier today, patient's aide was with her, and reports that it appeared patient did not know how to use her walker, kept walking into the walls with a walker, her speech did not make sense also, this began at about 1030 today.  It was also noted that she was not moving her left side-about 1:30 PM.  Patient was also still confused.  Patients niece reports significant jecking of all patient's extremities, she is adamant and sure it was not tremors, reports her husband has Parkinson's and she knows the difference. BSE requested  Subjective: "Mmhmmm." Assessment / Plan / Recommendation CHL IP CLINICAL IMPRESSIONS 03/03/2018 Clinical Impression Pt exhibited minimal oral dysphagia with discoordination of control and transit primarily with thin. Protective mechanisms including hyoid excursion, epiglottic retroflexion, arytenoid cartilidges approximation to epiglottic petiole were functional. Flash laryngeal penetration with straw present which is not considered abnormal. No significant residue and esophageal scan unremarkable (MBS only diagnosis above level of UES). Recommend regular texture, thin liquids, pills whole in applesauce, straws allowed and full supervision initially. ST will briefly follow.   SLP Visit Diagnosis Dysphagia, oral phase (R13.11) Attention and concentration deficit following -- Frontal lobe and executive function deficit following -- Impact on safety and function Mild aspiration risk   CHL IP TREATMENT RECOMMENDATION 03/03/2018 Treatment Recommendations Therapy as outlined in treatment plan below   Prognosis 03/03/2018 Prognosis for Safe Diet Advancement Good Barriers to Reach Goals -- Barriers/Prognosis Comment -- CHL IP DIET RECOMMENDATION 03/03/2018 SLP Diet Recommendations Regular solids;Thin liquid Liquid Administration via Cup;Straw Medication Administration Whole meds with puree Compensations Slow rate;Small sips/bites Postural Changes Seated upright at 90 degrees    CHL IP OTHER RECOMMENDATIONS 03/03/2018 Recommended Consults -- Oral Care Recommendations Oral care BID Other Recommendations --   CHL IP FOLLOW UP RECOMMENDATIONS 03/03/2018 Follow up Recommendations Skilled Nursing facility   [facility] IP FREQUENCY AND DURATION 03/03/2018 Speech  Therapy Frequency (ACUTE ONLY) min 2x/week Treatment Duration 1 week      CHL IP ORAL PHASE 03/03/2018 Oral Phase Impaired Oral - Pudding Teaspoon -- Oral - Pudding Cup -- Oral - Honey Teaspoon -- Oral - Honey Cup -- Oral - Nectar Teaspoon -- Oral - Nectar Cup -- Oral - Nectar Straw -- Oral - Thin Teaspoon -- Oral - Thin Cup Reduced posterior propulsion Oral - Thin Straw Reduced posterior propulsion Oral - Puree -- Oral - Mech Soft -- Oral - Regular WFL Oral - Multi-Consistency -- Oral - Pill -- Oral Phase - Comment --  CHL IP PHARYNGEAL PHASE 03/03/2018 Pharyngeal Phase Impaired Pharyngeal- Pudding Teaspoon -- Pharyngeal -- Pharyngeal- Pudding Cup -- Pharyngeal -- Pharyngeal- Honey Teaspoon -- Pharyngeal -- Pharyngeal- Honey Cup -- Pharyngeal -- Pharyngeal- Nectar Teaspoon -- Pharyngeal -- Pharyngeal- Nectar Cup -- Pharyngeal -- Pharyngeal- Nectar Straw -- Pharyngeal -- Pharyngeal- Thin Teaspoon -- Pharyngeal -- Pharyngeal- Thin Cup WFL Pharyngeal -- Pharyngeal- Thin Straw Penetration/Aspiration during swallow Pharyngeal Material enters airway, remains ABOVE vocal cords then ejected out Pharyngeal- Puree -- Pharyngeal -- Pharyngeal- Mechanical Soft -- Pharyngeal -- Pharyngeal- Regular WFL Pharyngeal -- Pharyngeal- Multi-consistency -- Pharyngeal -- Pharyngeal- Pill -- Pharyngeal -- Pharyngeal Comment --  CHL IP CERVICAL ESOPHAGEAL PHASE 03/03/2018 Cervical Esophageal Phase (No Data) Pudding Teaspoon -- Pudding Cup -- Honey Teaspoon -- Honey Cup -- Nectar Teaspoon -- Nectar Cup -- Nectar Straw -- Thin Teaspoon -- Thin Cup -- Thin Straw -- Puree -- Mechanical Soft -- Regular -- Multi-consistency -- Pill -- Cervical Esophageal Comment -- [place] Siren 03/03/2018, 1:36 PM Orbie Pyo Litaker M.Ed Actor Pager [phone] Office [phone]                   Scheduled Meds: . atorvastatin  20 mg Oral QHS  . Chlorhexidine Gluconate Cloth  6 each Topical Q0600  . digoxin  0.125 mg Oral q morning - 10a  . donepezil  5 mg Oral QHS  . enoxaparin (LOVENOX) injection  1 mg/kg Subcutaneous Q12H  . flecainide  50 mg Oral BID  . levothyroxine  50 mcg Oral QAC breakfast  . mouth rinse  15 mL Mouth Rinse BID  . metoprolol succinate  100 mg Oral q morning - 10a  . mupirocin ointment  1 application Nasal BID  . phenytoin  100 mg Oral TID   Continuous Infusions: . sodium chloride 75 mL/hr at 03/04/18 2310  . cefTRIAXone (ROCEPHIN)  IV    . diltiazem (CARDIZEM) infusion    . sodium chloride    . vancomycin       LOS: 2 days    Time spent: 35 minutes    Elmarie Shiley, MD [facility]  03/05/2018, 8:43 AM

## 2018-03-06 DIAGNOSIS — I482 Chronic atrial fibrillation, unspecified: Secondary | ICD-10-CM

## 2018-03-06 LAB — CBC
HCT: 37.2 % (ref 36.0–46.0)
Hemoglobin: 12.6 g/dL (ref 12.0–15.0)
MCH: 30.4 pg (ref 26.0–34.0)
MCHC: 33.9 g/dL (ref 30.0–36.0)
MCV: 89.6 fL (ref 80.0–100.0)
Platelets: 283 10*3/uL (ref 150–400)
RBC: 4.15 MIL/uL (ref 3.87–5.11)
RDW: 15.1 % (ref 11.5–15.5)
WBC: 6.9 10*3/uL (ref 4.0–10.5)
nRBC: 0 % (ref 0.0–0.2)

## 2018-03-06 LAB — BASIC METABOLIC PANEL
Anion gap: 12 (ref 5–15)
BUN: 6 mg/dL — ABNORMAL LOW (ref 8–23)
CO2: 23 mmol/L (ref 22–32)
Calcium: 8.1 mg/dL — ABNORMAL LOW (ref 8.9–10.3)
Chloride: 100 mmol/L (ref 98–111)
Creatinine, Ser: 0.39 mg/dL — ABNORMAL LOW (ref 0.44–1.00)
GFR calc Af Amer: >60 mL/min (ref >60)
Glucose, Bld: 128 mg/dL — ABNORMAL HIGH (ref 70–99)
Potassium: 2.8 mmol/L — ABNORMAL LOW (ref 3.5–5.1)
SODIUM: 135 mmol/L (ref 135–145)

## 2018-03-06 LAB — CULTURE, BLOOD (ROUTINE X 2)
Culture: NO GROWTH
Culture: NO GROWTH
SPECIAL REQUESTS: ADEQUATE
SPECIAL REQUESTS: ADEQUATE

## 2018-03-06 LAB — T3, FREE: T3, Free: 2.4 pg/mL (ref 2.0–4.4)

## 2018-03-06 MED ORDER — DILTIAZEM HCL ER COATED BEADS 180 MG PO CP24
180.0000 mg | ORAL_CAPSULE | Freq: Every day | ORAL | Status: DC
  Administered 2018-03-06 – 2018-03-07 (×2): 180 mg via ORAL
  Filled 2018-03-06 (×2): qty 1

## 2018-03-06 MED ORDER — POTASSIUM CHLORIDE 10 MEQ/100ML IV SOLN
10.0000 meq | INTRAVENOUS | Status: AC
  Administered 2018-03-06 (×2): 10 meq via INTRAVENOUS
  Filled 2018-03-06 (×2): qty 100

## 2018-03-06 MED ORDER — POTASSIUM CHLORIDE CRYS ER 20 MEQ PO TBCR
40.0000 meq | EXTENDED_RELEASE_TABLET | ORAL | Status: AC
  Administered 2018-03-06 (×2): 40 meq via ORAL
  Filled 2018-03-06 (×2): qty 2

## 2018-03-06 MED ORDER — HYDRALAZINE HCL 10 MG PO TABS
10.0000 mg | ORAL_TABLET | Freq: Two times a day (BID) | ORAL | Status: DC
  Administered 2018-03-07: 10 mg via ORAL
  Filled 2018-03-06 (×2): qty 1

## 2018-03-06 NOTE — Progress Notes (Signed)
CSW called and spoke with the patient's son. Patient's son is agreeable to [facility].   CSW will start insurance authorization with the facility.   Domenic Schwab, MSW, [place]

## 2018-03-06 NOTE — Consult Note (Signed)
   [facility] Inpatient Consult   03/06/2018  [name] [birth year] [id number]   Patient chart has been reviewed for readmissions less than 30 days and for medium risk score of 17% for unplanned readmissions.  Patient is currently active with [facility] CM services and has been engaged by a Charity fundraiser.  Chart review reveals current dispostion plan is for SNF.  Will continue to follow for progression plans.   [name], MSN, [facility] Liaison Nurse Mobile Phone [phone]  Toll free office [phone]

## 2018-03-06 NOTE — Progress Notes (Signed)
Physical Therapy Treatment Patient Details Name: [name] MRN: [id number] DOB: 15-Dec-1932 Today's Date: 03/06/2018    History of Present Illness Patient is a 85 y/o female who presents with gait difficulties and left sided weakness. Concern for generalized tonic clonic seizures. Recent hospitalization 1/27-1/31 for aphasia/encephalopathy secondary to TIA/seizures. EKG-abnormal electroencephalogram secondary to right hemispheric slowing. PMH includes PAF, TIAs, dementia, diastolic heart failure, CAD, HTN, seizure disorder, L TSA, bil TKA, right RTC repair.    PT Comments    Patient progressing slowly towards PT goals. Pt restless and legs are hanging over the rail upon PT arrival. Requires Mod A to get to EOB due to pushing into extension upon elevating trunk. Pt very confused but able to follow 1-2 step commands with repetition inconsistently. Able to stand x2 and side step along side bed with Mod A of 2 and assist for weight shifting. Deferred transfer to chair as pt high fall risk and confused. Will continue to follow and progress as tolerated. Anticipate mobility will improve if mentation clears/improves.    Follow Up Recommendations  SNF;Supervision for mobility/OOB     Equipment Recommendations  None recommended by PT    Recommendations for Other Services       Precautions / Restrictions Precautions Precautions: Fall Precaution Comments: bil mitt restraints, confused Restrictions Weight Bearing Restrictions: No    Mobility  Bed Mobility Overal bed mobility: Needs Assistance Bed Mobility: Supine to Sit;Sit to Supine     Supine to sit: Mod assist;+2 for physical assistance Sit to supine: Mod assist;+2 for physical assistance   General bed mobility comments: Assist to elevate trunk to get to EOB with pt pushing back into trunk extension requiring therapist to block knees to prevent sliding off bed.   Transfers Overall transfer level: Needs assistance Equipment used:  2 person hand held assist Transfers: Sit to/from Stand Sit to Stand: Mod assist;+2 physical assistance         General transfer comment: ASsist of 2 to power to standing with cues for anterior weight shift and upright, forward flexed posture. Stood from [employer].   Ambulation/Gait Ambulation/Gait assistance: Mod assist;+2 physical assistance Gait Distance (Feet): 3 Feet Assistive device: 2 person hand held assist Gait Pattern/deviations: Step-to pattern;Trunk flexed;Narrow base of support;Leaning posteriorly Gait velocity: decreased   General Gait Details: Able to side step along side bed with Mod A of 2 and assist for weight shifting. No knee buckling noted. Performed x2, difficulty sequencing.    Stairs             Wheelchair Mobility    Modified Rankin (Stroke Patients Only) Modified Rankin (Stroke Patients Only) Pre-Morbid Rankin Score: Moderately severe disability Modified Rankin: Moderately severe disability     Balance Overall balance assessment: Needs assistance Sitting-balance support: Feet supported;No upper extremity supported Sitting balance-Leahy Scale: Fair Sitting balance - Comments: Able to sit EOB without UE support, no LOB but close Min guard needed due confusion.   Standing balance support: During functional activity Standing balance-Leahy Scale: Poor Standing balance comment: Requires external support for standing balance.                             Cognition Arousal/Alertness: Awake/alert Behavior During Therapy: Restless Overall Cognitive Status: Impaired/Different from baseline Area of Impairment: Orientation;Attention;Memory;Following commands                 Orientation Level: Disoriented to;Place;Time;Situation Current Attention Level: Sustained Memory: Decreased short-term memory  Following Commands: Follows one step commands with increased time;Follows one step commands inconsistently;Follows multi-step commands  inconsistently     Problem Solving: Slow processing;Difficulty sequencing;Requires verbal cues;Requires tactile cues;Decreased initiation General Comments: Pt's legs up over the rail upon arrival trying to get OOB. Chewing and pocketing food in mouth, RN present and aware. Attempting to reach into air and make motions with UEs (mitts on), "trying to get my socks out of here." Able to be redirected.       Exercises      General Comments General comments (skin integrity, edema, etc.): VSS      Pertinent Vitals/Pain Pain Assessment: Faces Faces Pain Scale: No hurt    Home Living                      Prior Function            PT Goals (current goals can now be found in the care plan section) Progress towards PT goals: Progressing toward goals    Frequency    Min 3X/week      PT Plan Current plan remains appropriate    Co-evaluation              AM-PAC PT "6 Clicks" Mobility   Outcome Measure  Help needed turning from your back to your side while in a flat bed without using bedrails?: A Lot Help needed moving from lying on your back to sitting on the side of a flat bed without using bedrails?: A Lot Help needed moving to and from a bed to a chair (including a wheelchair)?: A Lot Help needed standing up from a chair using your arms (e.g., wheelchair or bedside chair)?: A Lot Help needed to walk in hospital room?: A Lot Help needed climbing 3-5 steps with a railing? : Total 6 Click Score: 11    End of Session Equipment Utilized During Treatment: Gait belt Activity Tolerance: Patient tolerated treatment well Patient left: in bed;with call bell/phone within reach;with bed alarm set Nurse Communication: Mobility status PT Visit Diagnosis: Difficulty in walking, not elsewhere classified (R26.2);Muscle weakness (generalized) (M62.81);Unsteadiness on feet (R26.81)     Time: 7169-6789 PT Time Calculation (min) (ACUTE ONLY): 20 min  Charges:  $Therapeutic  Activity: 8-22 mins                     [name], PT, DPT Acute Rehabilitation Services Pager [phone] Office [phone]       Marguarite Arbour A Sabra Heck 03/06/2018, 1:14 PM

## 2018-03-06 NOTE — Progress Notes (Addendum)
Progress Note  Patient Name: [name] Date of Encounter: 03/06/2018  Primary Cardiologist: Minus Breeding, MD  Subjective   Confused. Refers to IV pole as bread. Thinks it's 1982, [place]. Denies any chest pain or SOB.  Inpatient Medications    Scheduled Meds: . atorvastatin  20 mg Oral QHS  . Chlorhexidine Gluconate Cloth  6 each Topical Q0600  . digoxin  0.125 mg Oral q morning - 10a  . donepezil  5 mg Oral QHS  . enoxaparin (LOVENOX) injection  1 mg/kg Subcutaneous Q12H  . levothyroxine  50 mcg Oral QAC breakfast  . mouth rinse  15 mL Mouth Rinse BID  . metoprolol succinate  100 mg Oral q morning - 10a  . mupirocin ointment  1 application Nasal BID  . phenytoin  100 mg Oral TID   Continuous Infusions: . sodium chloride 75 mL/hr at 03/04/18 2310  . cefTRIAXone (ROCEPHIN)  IV 1 g (03/06/18 0932)  . diltiazem (CARDIZEM) infusion 7.5 mg/hr (03/05/18 2009)  . vancomycin 1,250 mg (03/05/18 1215)   PRN Meds: acetaminophen, hydrALAZINE, ondansetron   Vital Signs    Vitals:   03/06/18 0000 03/06/18 0410 03/06/18 0640 03/06/18 0807  BP: (!) 152/97 (!) 158/60 (!) 141/83 (!) 137/53  Pulse: 72 78  75  Resp: 15 (!) 21  16  Temp: (!) 97.5 F (36.4 C) 97.7 F (36.5 C)    TempSrc: Axillary Oral    SpO2: 96%   98%  Weight:      Height:        Intake/Output Summary (Last 24 hours) at 03/06/2018 1059 Last data filed at 03/06/2018 0811 Gross per 24 hour  Intake 2654.73 ml  Output 1350 ml  Net 1304.73 ml   Last 3 Weights 03/01/2018 03/01/2018 02/17/2018  Weight (lbs) 126 lb 1.7 oz 131 lb 4 oz 131 lb  Weight (kg) 57.2 kg 59.535 kg 59.421 kg     Telemetry    Atrial fib, occ PVCs, brief triplet - Personally Reviewed  Physical Exam   GEN: No acute distress.  HEENT: Normocephalic, atraumatic, sclera non-icteric. Neck: No JVD or bruits. Cardiac: Irregularly irregular, no murmurs, rubs, or gallops.  Radials/DP/PT 1+ and equal bilaterally.  Respiratory: Clear  to auscultation bilaterally. Breathing is unlabored. GI: Soft, nontender, non-distended, BS +x 4. MS: no deformity. Extremities: No clubbing or cyanosis. No edema. Distal pedal pulses are 2+ and equal bilaterally. Neuro:  A+O to self only - becomes confused even with commands as well Psych: confused affect, not currently agitated  Labs    Chemistry Recent Labs  Lab 03/01/18 1410  03/04/18 0754 03/05/18 0504 03/06/18 0850  NA 133*   < > 129* 132* 135  K 3.8   < > 3.5 3.8 2.8*  CL 99   < > 92* 96* 100  CO2 25   < > 22 23 23   GLUCOSE 161*   < > 82 108* 128*  BUN 17   < > 14 13 6*  CREATININE 0.54   < > 0.58 0.64 0.39*  CALCIUM 8.6*   < > 8.3* 8.3* 8.1*  PROT 6.7  --   --   --   --   ALBUMIN 3.7  --   --   --   --   AST 22  --   --   --   --   ALT 15  --   --   --   --   ALKPHOS 64  --   --   --   --  BILITOT 0.5  --   --   --   --   GFRNONAA >60   < > >60 >60 >60  GFRAA >60   < > >60 >60 >60  ANIONGAP 9   < > 15 13 12    < > = values in this interval not displayed.     Hematology Recent Labs  Lab 03/04/18 0754 03/05/18 0504 03/06/18 0850  WBC 8.5 9.8 6.9  RBC 4.76 5.01 4.15  HGB 14.4 15.1* 12.6  HCT 42.6 44.8 37.2  MCV 89.5 89.4 89.6  MCH 30.3 30.1 30.4  MCHC 33.8 33.7 33.9  RDW 14.8 14.8 15.1  PLT 283 272 283    Cardiac Enzymes Recent Labs  Lab 03/02/18 0244 03/02/18 0842 03/02/18 1458  TROPONINI 0.05* 0.04* <0.03   No results for input(s): TROPIPOC in the last 168 hours.   BNPNo results for input(s): BNP, PROBNP in the last 168 hours.   DDimer No results for input(s): DDIMER in the last 168 hours.   Radiology    Ct Abdomen Pelvis W Contrast  Result Date: 03/05/2018 CLINICAL DATA:  Fever of unknown origin. EXAM: CT ABDOMEN AND PELVIS WITH CONTRAST TECHNIQUE: Multidetector CT imaging of the abdomen and pelvis was performed using the standard protocol following bolus administration of intravenous contrast. CONTRAST:  173mL ISOVUE-300 IOPAMIDOL  (ISOVUE-300) INJECTION 61% COMPARISON:  12/18/2017 FINDINGS: Lower chest: Linear atelectasis in the lung bases. Cardiac enlargement with particular right heart enlargement. Coronary artery calcifications. Small esophageal hiatal hernia. Hepatobiliary: Gallbladder is surgically absent. Intra and extrahepatic bile duct dilatation is likely postoperative. No focal liver lesions. Pancreas: Unremarkable. No pancreatic ductal dilatation or surrounding inflammatory changes. Spleen: 1.6 cm cyst in the upper pole of the spleen. Spleen size is normal. Adrenals/Urinary Tract: No adrenal gland nodules. Cyst in the upper pole right kidney. Nephrograms are homogeneous and symmetrical. No hydronephrosis or hydroureter. Bladder is distended but no wall thickening or filling defects are identified. Stomach/Bowel: Stomach, small bowel, and colon are not abnormally distended. There is a small diverticulum off of the posterior gastric cardia. Diverticulosis of the sigmoid colon with sigmoid colonic wall thickening similar to prior study, likely due to muscular hypertrophy. No definite evidence of diverticulitis. Residual contrast material in the colon. Appendix is normal. Vascular/Lymphatic: Aortic atherosclerosis. No enlarged abdominal or pelvic lymph nodes. Reproductive: Uterus and bilateral adnexa are unremarkable. Other: No free air or free fluid in the abdomen. Abdominal wall musculature appears intact. Musculoskeletal: Degenerative changes in the spine. No destructive bone lesions. IMPRESSION: 1. No acute process demonstrated in the abdomen or pelvis. No evidence of bowel obstruction or inflammation. 2. Diverticulosis of the sigmoid colon with sigmoid colonic wall thickening likely due to muscular hypertrophy. 3. Small esophageal hiatal hernia. 4. Small gastric diverticulum. 5. Cardiac enlargement. 6. Intra and extrahepatic bile duct dilatation is likely postoperative after cholecystectomy. Electronically Signed   By: Lucienne Capers M.D.   On: 03/05/2018 03:30    Cardiac Studies   2D echo 01/31/18 IMPRESSIONS    1. No cardiac source of embolism seen.  2. The left ventricle appears to be normal in size, has borderline wall thickness 60-65% ejection fraction Spectral Doppler shows normal pattern of diastolic filling.  3. The right ventricle has normal size and mildly reduced systolic function.  4. Moderately dilated left atrial size.  5. Mildly dilated right atrial size.  6. Trivial pericardial effusion, as described above.  7. Mitral valve regurgitation is mild by color flow Doppler.  8. The  mitral valve normal in structure and function.  9. Normal tricuspid valve. 10. Aortic valve normal. 11. There is mild calcification of the aortic valve. 12. There is mild thickening of the aortic valve. 13. No atrial level shunt detected by color flow Doppler.  Patient Profile     85 y.o. female with persistent AF, HTN, chronic diastolic CHF, HLD, prior TIAs, recent TIA vs seizure 01/2018. Sh was admitted 2/26 with left sided weakness, AMS and seizure activity. CT negative for acute infarct, seen by neurologist for generalized tonic-clonic seizures -> Keppra increased. She also was febrile. Her Keppra now changed to Dilantin as it thought it might causing AMS. Plan to change Eliquis to edoxaban at discharge due to interaction with phenytoin, currently on Lovenox.  Assessment & Plan    1. Persistent atrial fib - seen by Dr. Percival Spanish 12/2017 and felt to be in persistent AF for the past several months -> plan was for OP TEE DCCV but pt was ultimately admitted as above. Difficult situation - with confusion, dementia and seizure activity she is at risk for complication for anticoagulation but on the same token also is high risk for stroke given prior reported TIA vs CVA. She will continue Lovenox for now. Will need edoxaban at DC (interaction of apixaban with phenytoin) - pharmacy has left recs for this. It is now felt the  best strategy for her AF is rate control and anticoagulation as she is asymptomatic. She is rate controlled currently on digoxin 0.125mg , Toprol 100mg  daily, and IV diltiazem at 7.5mg /hr. I'm not clear on what the discharge plan is, but if going home today would transition IV diltiazem to equivalent dose of 180mg  daily. If remaining inpatient for further testing, can use short acting diltiazem 45mg  q6 hr.  2. Hypokalemia - per IM.  3. Elevated troponin - peak 0.05 -not felt to be clinically significant, may have been related to RVR.  4. H/o chronic diastolic CHF - appears euvolemic.  5. AMS/seizure activity, fever superimposed on moderate dementia - per IM, neuro. LP not felt to be needed per neuro. Now afebrile with normal WBC.  6. HTN - OK on current regimen.  She already has f/u appt 04/05/18 with Dr. Percival Spanish - would keep this appointment.  For questions or updates, please contact [place] Please consult [email or web address] for contact info under Cardiology/STEMI.  Signed, Charlie Pitter, PA-C 03/06/2018, 10:59 AM     History and all data above reviewed.  Patient examined.  I agree with the findings as above.  Very confused and agitated in the bed.  No acute distressThe patient exam reveals [id number]  ,  Lungs: Clear  ,  Abd: Positive bowel sounds, no rebound no guarding, Ext No edema  .  All available labs, radiology testing, previous records reviewed. Agree with documented assessment and plan.   Agree with plans for PO Cardizem and then discontinue IV.  Anticoagulation as above.  We are no longer planning rhythm control but will pursue rate control and anticoagulation.   Jeneen Rinks Ikia Cincotta  12:25 PM  03/06/2018

## 2018-03-06 NOTE — Progress Notes (Addendum)
PROGRESS NOTE    [name]  BWI:[id number] DOB: May 10, 1932 DOA: 03/01/2018 PCP: Dettinger, Fransisca Kaufmann, MD   Brief Narrative: 85 year old with past medical history significant for paroxysmal A. fib, diastolic heart failure, dementia, hypertension, brought to the hospital with left side weakness, confusion, concern for seizure, she was also having some shakiness.  She received IV Keppra and Ativan in the emergency department.  CT of the brain did not show any new findings.  Patient remained lethargic after admission.  Assessment & Plan:   Active Problems:   Hypothyroidism   Paroxysmal atrial fibrillation (HCC)   HTN (hypertension)   Left-sided weakness   Seizure ([place])   Acute encephalopathy  1-Seizure; presents with confusion, left side weakness, shaking.  EEG; right hemispheric slowing.  MRI negative for Stroke.  Control BP.  Keppra was discontinue due to sedative.  Patient was a started on Dilantin.  She has been tolerating Dilantin so far. Alert, confuse.    2-Acute metabolic encephalopathy;  Related to seizure, dictation Keppra. Controlled BP.  Alert, confuse.   3-PAF;  On metoprolol, fleicanide, digoxin. On lovenox.  HR elevated today. Started Cardizem Gtt. Cardiology consulted.  On lovenox. She will need on edoxaban.  Will transition to oral Cardizem.   4-Hypothyroidism;  Resume synthroid. TSH elevated. Check Free T 3 and T 4 normal. .   5-HTN; resume metoprolol.   6-Dementia; resume aricept.   7-Fever; suspect related to UTI, Urine culture from [facility] grew multiples bacteria.   influenza negative,  Chest x ray negative for PNA.  Stop vancomycin. Ceftriaxone for UTI>  Blood culture no growth to date.  Respiratory panel negative.  CT abdomen negative for acute infection. Repeated blood cultures on 2/29; no growth UA clean.  Chest  x-ray negative.  8-Hyponatremia; resolved with fluids.   9-hypokalemia; replete IV and orally.   Estimated body mass index is 23.06  kg/m as calculated from the following:   Height as of this encounter: 5\' 2"  (1.575 m).   Weight as of this encounter: 57.2 kg.   DVT prophylaxis: lovenox Code Status: full code Family Communication: son at bedside.  Disposition Plan: remain in the hospital, for evaluation of encephalopathy. Patient needs rehab,  to be able to regain strengthen and be able to go back to prior baseline.    Consultants:   neurology   Procedures:   none  Antimicrobials:  cefepime Subjective;  Alert, confuse  Objective: Vitals:   03/06/18 0000 03/06/18 0410 03/06/18 0640 03/06/18 0807  BP: (!) 152/97 (!) 158/60 (!) 141/83 (!) 137/53  Pulse: 72 78  75  Resp: 15 (!) 21  16  Temp: (!) 97.5 F (36.4 C) 97.7 F (36.5 C)    TempSrc: Axillary Oral    SpO2: 96%   98%  Weight:      Height:        Intake/Output Summary (Last 24 hours) at 03/06/2018 1121 Last data filed at 03/06/2018 6384 Gross per 24 hour  Intake 2654.73 ml  Output 1350 ml  Net 1304.73 ml   Filed Weights   03/01/18 1358 03/01/18 1937  Weight: 59.5 kg 57.2 kg    Examination:  General exam: NAD Respiratory system: CTA Cardiovascular system: S 1, S 2 RRR Gastrointestinal system: BS present, soft, nt Central nervous system: alert , follow command Extremities: no edema  Data Reviewed: I have personally reviewed following labs and imaging studies  CBC: Recent Labs  Lab 03/01/18 1353 03/02/18 5364 03/04/18 0754 03/05/18 0504 03/06/18 6803  WBC 7.8 8.4 8.5 9.8 6.9  NEUTROABS 4.9  --   --   --   --   HGB 12.8 13.0 14.4 15.1* 12.6  HCT 40.5 40.5 42.6 44.8 37.2  MCV 93.5 95.5 89.5 89.4 89.6  PLT 271 256 283 272 169   Basic Metabolic Panel: Recent Labs  Lab 03/01/18 1410 03/02/18 0244 03/02/18 0842 03/04/18 0754 03/05/18 0504 03/06/18 0850  NA 133*  --  134* 129* 132* 135  K 3.8  --  3.8 3.5 3.8 2.8*  CL 99  --  98 92* 96* 100  CO2 25  --  27 22 23 23   GLUCOSE 161*  --  96 82 108* 128*  BUN 17  --  11 14  13  6*  CREATININE 0.54  --  0.49 0.58 0.64 0.39*  CALCIUM 8.6*  --  8.4* 8.3* 8.3* 8.1*  MG  --  1.9  --   --   --   --    GFR: Estimated Creatinine Clearance: 41.4 mL/min (A) (by C-G formula based on SCr of 0.39 mg/dL (L)). Liver Function Tests: Recent Labs  Lab 03/01/18 1410  AST 22  ALT 15  ALKPHOS 64  BILITOT 0.5  PROT 6.7  ALBUMIN 3.7   No results for input(s): LIPASE, AMYLASE in the last 168 hours. Recent Labs  Lab 03/03/18 0803  AMMONIA 10   Coagulation Profile: Recent Labs  Lab 03/01/18 1410  INR 1.1   Cardiac Enzymes: Recent Labs  Lab 03/02/18 0244 03/02/18 0842 03/02/18 1458  TROPONINI 0.05* 0.04* <0.03   BNP (last 3 results) No results for input(s): PROBNP in the last 8760 hours. HbA1C: No results for input(s): HGBA1C in the last 72 hours. CBG: Recent Labs  Lab 03/01/18 1403 03/03/18 0431 03/04/18 1045  GLUCAP 167* 75 86   Lipid Profile: No results for input(s): CHOL, HDL, LDLCALC, TRIG, CHOLHDL, LDLDIRECT in the last 72 hours. Thyroid Function Tests: Recent Labs    03/04/18 0648  FREET4 1.63   Anemia Panel: No results for input(s): VITAMINB12, FOLATE, FERRITIN, TIBC, IRON, RETICCTPCT in the last 72 hours. Sepsis Labs: Recent Labs  Lab 03/01/18 2058 03/02/18 0056  LATICACIDVEN 2.0* 1.1    Recent Results (from the past 240 hour(s))  Culture, Urine     Status: Abnormal   Collection Time: 03/01/18  8:23 PM  Result Value Ref Range Status   Specimen Description   Final    URINE, RANDOM Performed at [facility], [address] [place], [place] [address]    Special Requests   Final    NONE Performed at [facility], [address] [place], [place] [address]    Culture MULTIPLE SPECIES PRESENT, SUGGEST RECOLLECTION (A)  Final   Report Status 03/03/2018 FINAL  Final  Culture, blood (routine x 2)     Status: None   Collection Time: 03/01/18  8:56 PM  Result Value Ref Range Status   Specimen Description BLOOD RIGHT ARM  Final    Special Requests   Final    BOTTLES DRAWN AEROBIC AND ANAEROBIC Blood Culture adequate volume   Culture   Final    NO GROWTH 5 DAYS Performed at [facility], [address], [place],  [address]    Report Status 03/06/2018 FINAL  Final  Culture, blood (routine x 2)     Status: None   Collection Time: 03/01/18  8:58 PM  Result Value Ref Range Status   Specimen Description BLOOD RIGHT HAND  Final  Special Requests   Final    BOTTLES DRAWN AEROBIC AND ANAEROBIC Blood Culture adequate volume   Culture   Final    NO GROWTH 5 DAYS Performed at [facility], [address] [place], [place] [address]    Report Status 03/06/2018 FINAL  Final  Urine Culture     Status: None   Collection Time: 03/04/18  7:23 AM  Result Value Ref Range Status   Specimen Description URINE, RANDOM  Final   Special Requests NONE  Final   Culture   Final    NO GROWTH Performed at [facility], [place] [address], [place], [place] [address]    Report Status 03/05/2018 FINAL  Final  MRSA PCR Screening     Status: Abnormal   Collection Time: 03/04/18  7:24 AM  Result Value Ref Range Status   MRSA by PCR POSITIVE (A) NEGATIVE Final    Comment:        The GeneXpert MRSA Assay (FDA approved for NASAL specimens only), is one component of a comprehensive MRSA colonization surveillance program. It is not intended to diagnose MRSA infection nor to guide or monitor treatment for MRSA infections. CRITICAL RESULT CALLED TO, READ BACK BY AND VERIFIED WITH: RN Oneida Arenas [id number] FCP   Culture, blood (routine x 2)     Status: None (Preliminary result)   Collection Time: 03/04/18  8:08 AM  Result Value Ref Range Status   Specimen Description BLOOD LEFT HAND  Final   Special Requests   Final    AEROBIC BOTTLE ONLY Blood Culture results may not be optimal due to an excessive volume of blood received in culture bottles Performed at [facility] [address] [place], [place] [address]     Culture NO GROWTH 2 DAYS  Final   Report Status PENDING  Incomplete  Culture, blood (routine x 2)     Status: None (Preliminary result)   Collection Time: 03/04/18  8:14 AM  Result Value Ref Range Status   Specimen Description BLOOD RIGHT HAND  Final   Special Requests   Final    AEROBIC BOTTLE ONLY Blood Culture adequate volume Performed at [facility] [address] [place], [place] [address]    Culture NO GROWTH 2 DAYS  Final   Report Status PENDING  Incomplete  Respiratory Panel by PCR     Status: None   Collection Time: 03/04/18 12:07 PM  Result Value Ref Range Status   Adenovirus NOT DETECTED NOT DETECTED Final   Coronavirus 229E NOT DETECTED NOT DETECTED Final    Comment: (NOTE) The Coronavirus on the Respiratory Panel, DOES NOT test for the novel  Coronavirus (2019 nCoV)    Coronavirus HKU1 NOT DETECTED NOT DETECTED Final   Coronavirus NL63 NOT DETECTED NOT DETECTED Final   Coronavirus OC43 NOT DETECTED NOT DETECTED Final   Metapneumovirus NOT DETECTED NOT DETECTED Final   Rhinovirus / Enterovirus NOT DETECTED NOT DETECTED Final   Influenza A NOT DETECTED NOT DETECTED Final   Influenza B NOT DETECTED NOT DETECTED Final   Parainfluenza Virus 1 NOT DETECTED NOT DETECTED Final   Parainfluenza Virus 2 NOT DETECTED NOT DETECTED Final   Parainfluenza Virus 3 NOT DETECTED NOT DETECTED Final   Parainfluenza Virus 4 NOT DETECTED NOT DETECTED Final   Respiratory Syncytial Virus NOT DETECTED NOT DETECTED Final   Bordetella pertussis NOT DETECTED NOT DETECTED Final   Chlamydophila pneumoniae NOT DETECTED NOT DETECTED Final   Mycoplasma pneumoniae  NOT DETECTED NOT DETECTED Final    Comment: Performed at [facility], [place] [address] [place], [place] [address]         Radiology Studies: Ct Abdomen Pelvis W Contrast  Result Date: 03/05/2018 CLINICAL DATA:  Fever of unknown origin. EXAM: CT ABDOMEN AND PELVIS WITH CONTRAST TECHNIQUE: Multidetector CT imaging of the  abdomen and pelvis was performed using the standard protocol following bolus administration of intravenous contrast. CONTRAST:  123mL ISOVUE-300 IOPAMIDOL (ISOVUE-300) INJECTION 61% COMPARISON:  12/18/2017 FINDINGS: Lower chest: Linear atelectasis in the lung bases. Cardiac enlargement with particular right heart enlargement. Coronary artery calcifications. Small esophageal hiatal hernia. Hepatobiliary: Gallbladder is surgically absent. Intra and extrahepatic bile duct dilatation is likely postoperative. No focal liver lesions. Pancreas: Unremarkable. No pancreatic ductal dilatation or surrounding inflammatory changes. Spleen: 1.6 cm cyst in the upper pole of the spleen. Spleen size is normal. Adrenals/Urinary Tract: No adrenal gland nodules. Cyst in the upper pole right kidney. Nephrograms are homogeneous and symmetrical. No hydronephrosis or hydroureter. Bladder is distended but no wall thickening or filling defects are identified. Stomach/Bowel: Stomach, small bowel, and colon are not abnormally distended. There is a small diverticulum off of the posterior gastric cardia. Diverticulosis of the sigmoid colon with sigmoid colonic wall thickening similar to prior study, likely due to muscular hypertrophy. No definite evidence of diverticulitis. Residual contrast material in the colon. Appendix is normal. Vascular/Lymphatic: Aortic atherosclerosis. No enlarged abdominal or pelvic lymph nodes. Reproductive: Uterus and bilateral adnexa are unremarkable. Other: No free air or free fluid in the abdomen. Abdominal wall musculature appears intact. Musculoskeletal: Degenerative changes in the spine. No destructive bone lesions. IMPRESSION: 1. No acute process demonstrated in the abdomen or pelvis. No evidence of bowel obstruction or inflammation. 2. Diverticulosis of the sigmoid colon with sigmoid colonic wall thickening likely due to muscular hypertrophy. 3. Small esophageal hiatal hernia. 4. Small gastric diverticulum. 5.  Cardiac enlargement. 6. Intra and extrahepatic bile duct dilatation is likely postoperative after cholecystectomy. Electronically Signed   By: Lucienne Capers M.D.   On: 03/05/2018 03:30        Scheduled Meds: . atorvastatin  20 mg Oral QHS  . Chlorhexidine Gluconate Cloth  6 each Topical Q0600  . digoxin  0.125 mg Oral q morning - 10a  . donepezil  5 mg Oral QHS  . enoxaparin (LOVENOX) injection  1 mg/kg Subcutaneous Q12H  . levothyroxine  50 mcg Oral QAC breakfast  . mouth rinse  15 mL Mouth Rinse BID  . metoprolol succinate  100 mg Oral q morning - 10a  . mupirocin ointment  1 application Nasal BID  . phenytoin  100 mg Oral TID  . potassium chloride  40 mEq Oral Q4H   Continuous Infusions: . sodium chloride 75 mL/hr at 03/04/18 2310  . cefTRIAXone (ROCEPHIN)  IV 1 g (03/06/18 0932)  . diltiazem (CARDIZEM) infusion 7.5 mg/hr (03/05/18 2009)     LOS: 3 days    Time spent: 35 minutes    Elmarie Shiley, MD [facility]  03/06/2018, 11:21 AM

## 2018-03-07 DIAGNOSIS — R339 Retention of urine, unspecified: Secondary | ICD-10-CM | POA: Diagnosis not present

## 2018-03-07 DIAGNOSIS — R2689 Other abnormalities of gait and mobility: Secondary | ICD-10-CM | POA: Diagnosis not present

## 2018-03-07 DIAGNOSIS — R338 Other retention of urine: Secondary | ICD-10-CM | POA: Diagnosis not present

## 2018-03-07 DIAGNOSIS — R531 Weakness: Secondary | ICD-10-CM | POA: Diagnosis not present

## 2018-03-07 DIAGNOSIS — I4891 Unspecified atrial fibrillation: Secondary | ICD-10-CM | POA: Diagnosis not present

## 2018-03-07 DIAGNOSIS — G9340 Encephalopathy, unspecified: Secondary | ICD-10-CM | POA: Diagnosis not present

## 2018-03-07 DIAGNOSIS — M2550 Pain in unspecified joint: Secondary | ICD-10-CM | POA: Diagnosis not present

## 2018-03-07 DIAGNOSIS — N3281 Overactive bladder: Secondary | ICD-10-CM | POA: Diagnosis not present

## 2018-03-07 DIAGNOSIS — G9341 Metabolic encephalopathy: Secondary | ICD-10-CM | POA: Diagnosis not present

## 2018-03-07 DIAGNOSIS — I5030 Unspecified diastolic (congestive) heart failure: Secondary | ICD-10-CM | POA: Diagnosis not present

## 2018-03-07 DIAGNOSIS — I4819 Other persistent atrial fibrillation: Secondary | ICD-10-CM | POA: Diagnosis not present

## 2018-03-07 DIAGNOSIS — R278 Other lack of coordination: Secondary | ICD-10-CM | POA: Diagnosis not present

## 2018-03-07 DIAGNOSIS — E7849 Other hyperlipidemia: Secondary | ICD-10-CM | POA: Diagnosis not present

## 2018-03-07 DIAGNOSIS — I110 Hypertensive heart disease with heart failure: Secondary | ICD-10-CM | POA: Diagnosis not present

## 2018-03-07 DIAGNOSIS — E039 Hypothyroidism, unspecified: Secondary | ICD-10-CM | POA: Diagnosis not present

## 2018-03-07 DIAGNOSIS — G309 Alzheimer's disease, unspecified: Secondary | ICD-10-CM | POA: Diagnosis not present

## 2018-03-07 DIAGNOSIS — R1311 Dysphagia, oral phase: Secondary | ICD-10-CM | POA: Diagnosis not present

## 2018-03-07 DIAGNOSIS — I10 Essential (primary) hypertension: Secondary | ICD-10-CM | POA: Diagnosis not present

## 2018-03-07 DIAGNOSIS — E038 Other specified hypothyroidism: Secondary | ICD-10-CM | POA: Diagnosis not present

## 2018-03-07 DIAGNOSIS — Z7401 Bed confinement status: Secondary | ICD-10-CM | POA: Diagnosis not present

## 2018-03-07 DIAGNOSIS — M810 Age-related osteoporosis without current pathological fracture: Secondary | ICD-10-CM | POA: Diagnosis not present

## 2018-03-07 DIAGNOSIS — N281 Cyst of kidney, acquired: Secondary | ICD-10-CM | POA: Diagnosis not present

## 2018-03-07 DIAGNOSIS — N139 Obstructive and reflux uropathy, unspecified: Secondary | ICD-10-CM | POA: Diagnosis not present

## 2018-03-07 LAB — BASIC METABOLIC PANEL
Anion gap: 10 (ref 5–15)
BUN: <5 mg/dL — ABNORMAL LOW (ref 8–23)
CHLORIDE: 99 mmol/L (ref 98–111)
CO2: 24 mmol/L (ref 22–32)
Calcium: 8.5 mg/dL — ABNORMAL LOW (ref 8.9–10.3)
Creatinine, Ser: 0.48 mg/dL (ref 0.44–1.00)
GFR calc Af Amer: >60 mL/min (ref >60)
GFR calc non Af Amer: >60 mL/min (ref >60)
Glucose, Bld: 97 mg/dL (ref 70–99)
Potassium: 3.4 mmol/L — ABNORMAL LOW (ref 3.5–5.1)
Sodium: 133 mmol/L — ABNORMAL LOW (ref 135–145)

## 2018-03-07 MED ORDER — HYDRALAZINE HCL 10 MG PO TABS: 10.0000 mg | ORAL_TABLET | Freq: Two times a day (BID) | ORAL | 0 refills | Status: DC

## 2018-03-07 MED ORDER — POTASSIUM CHLORIDE CRYS ER 20 MEQ PO TBCR
40.0000 meq | EXTENDED_RELEASE_TABLET | ORAL | Status: AC
  Administered 2018-03-07 (×2): 40 meq via ORAL
  Filled 2018-03-07 (×2): qty 2

## 2018-03-07 MED ORDER — TAMSULOSIN HCL 0.4 MG PO CAPS
0.4000 mg | ORAL_CAPSULE | Freq: Every day | ORAL | Status: DC
  Administered 2018-03-07: 0.4 mg via ORAL
  Filled 2018-03-07: qty 1

## 2018-03-07 MED ORDER — DILTIAZEM HCL ER COATED BEADS 180 MG PO CP24: 180.0000 mg | ORAL_CAPSULE | Freq: Every day | ORAL | 0 refills | Status: DC

## 2018-03-07 MED ORDER — EDOXABAN TOSYLATE 30 MG PO TABS: 30.0000 mg | ORAL_TABLET | ORAL | 0 refills | Status: DC

## 2018-03-07 MED ORDER — EDOXABAN TOSYLATE 30 MG PO TABS
30.0000 mg | ORAL_TABLET | ORAL | Status: DC
  Filled 2018-03-07: qty 1

## 2018-03-07 MED ORDER — TAMSULOSIN HCL 0.4 MG PO CAPS: 0.4000 mg | ORAL_CAPSULE | Freq: Every day | ORAL | 0 refills | Status: DC

## 2018-03-07 MED ORDER — PHENYTOIN SODIUM EXTENDED 100 MG PO CAPS: 100.0000 mg | ORAL_CAPSULE | Freq: Three times a day (TID) | ORAL | 0 refills | Status: DC

## 2018-03-07 MED ORDER — CEPHALEXIN 500 MG PO CAPS: 500.0000 mg | ORAL_CAPSULE | Freq: Two times a day (BID) | ORAL | 0 refills | Status: AC

## 2018-03-07 NOTE — Progress Notes (Signed)
  Speech Language Pathology Treatment: Dysphagia  Patient Details Name: [name] MRN: [id number] DOB: 1932/11/27 Today's Date: 03/07/2018 Time: 8413-2440 SLP Time Calculation (min) (ACUTE ONLY): 30 min  Assessment / Plan / Recommendation Clinical Impression  Pt seen for dysphagia intervention during lunch meal with max verbal/visual/verbal cues needed to facilitate self feeding and swallow strategies. She exhibited confusion today, thought she was in her home and speaking to family members not present and verbal cues to attend to task. Significant oral difficulty observed with prolonged mastication of meat/corn, bilateral buccal cavity pocketing and required assist to remove after extended period. Appeared to have adequate airway protection during majority of session with delayed cough x 2 with possible intermittent laryngeal penetration. SLP downgraded diet to Dys 2 (ground/minced), continue thin, straws allowed, crush pills and full supervision assist due to cognitive challenges.   HPI [name] is a 85 y.o. female with medical history significant for paroxysmal A. Fib/atrial flutter, diastolic CHF, HTN, CVA, dementia, who presented to the ED after reports of initial confusion earlier today, patient's aide was with her, and reports that it appeared patient did not know how to use her walker, kept walking into the walls with a walker, speech difficulty, decreased movement of left side. MRI negative for acute abnormality. Found to have seizures and encephalopathy. BSE requested     SLP Plan  Continue with current plan of care       Recommendations  Diet recommendations: Dysphagia 2 (fine chop);Thin liquid Liquids provided via: Straw Medication Administration: Crushed with puree Supervision: Patient able to self feed;Staff to assist with self feeding;Full supervision/cueing for compensatory strategies Compensations: Small sips/bites;Slow rate;Lingual sweep for clearance of  pocketing Postural Changes and/or Swallow Maneuvers: Seated upright 90 degrees                Oral Care Recommendations: Oral care BID Follow up Recommendations: Skilled Nursing facility SLP Visit Diagnosis: Dysphagia, oral phase (R13.11) Plan: Continue with current plan of care       GO                Houston Siren 03/07/2018, 1:32 PM   Orbie Pyo Mairen Wallenstein M.Ed Risk analyst [phone] Office [phone]

## 2018-03-07 NOTE — Discharge Summary (Signed)
Physician Discharge Summary  [name]:[id number] DOB: [birth year] DOA: 03/01/2018  PCP: Warrick Parisian Fransisca Kaufmann, MD  Admit date: 03/01/2018 Discharge date: 03/07/2018  Admitted From: Home  Disposition; SNF  Recommendations for Outpatient Follow-up:  1. Follow up with PCP in 1-2 weeks 2. Please obtain BMP/CBC in one week 3. Needs to follow up with Dr Delice Lesch , neurology 4. Needs to follow up with [facility], family prefers Dr Tresa Moore.  5. Follow up with cardiology for A fib.    Discharge Condition: Stable.  CODE STATUS: Partial code Diet recommendation: Dysphagia 2 diet   Brief/Interim Summary:  85 year old with past medical history significant for paroxysmal A. fib, diastolic heart failure, dementia, hypertension, brought to the hospital with left side weakness, confusion, concern for seizure, she was also having some shakiness.  She received IV Keppra and Ativan in the emergency department.  CT of the brain did not show any new findings.  Patient remained lethargic after admission.  1-Seizure; presents with confusion, left side weakness, shaking.  EEG; right hemispheric slowing.  MRI negative for Stroke.  Control BP.  Keppra was discontinue due to sedative.  Patient was a started on Dilantin.  She has been tolerating Dilantin so far. Alert, confuse.  Continue with dilantin.   2-Acute metabolic encephalopathy;  Related to seizure, and Keppra. Controlled BP.  Alert, confuse.   3-PAF;  On metoprolol, fleicanide, digoxin. On lovenox.  HR elevated today. Started Cardizem Gtt. Cardiology consulted.  Discharge on  edoxaban. unable to use eliquis due to interaction with phenytoin.  On oral Cardizem.   4-Hypothyroidism;  Resume synthroid. TSH elevated. Free T 3 and T 4 normal. .   5-HTN; resume metoprolol. Started Cardizem and  hydralazine.   6-Dementia; resume aricept.   7-Fever; suspect related to UTI, Urine culture from [facility] grew multiples bacteria.   influenza  negative,  Chest x ray negative for PNA.  Stop vancomycin. Ceftriaxone for UTI>  Blood culture no growth to date.  Respiratory panel negative.  CT abdomen negative for acute infection. Repeated blood cultures on 2/29; no growth UA clean.  Chest  x-ray negative. Discharge on keflex for 4 days.  Develops urinary retention. Will need foley catheter. Needs follow up with urology.   8-Hyponatremia; resolved with fluids.   9-Hypokalemia; replete orally.   Discharge Diagnoses:  Active Problems:   Hypothyroidism   Paroxysmal atrial fibrillation (HCC)   HTN (hypertension)   Left-sided weakness   Seizure ([place])   Acute encephalopathy    Discharge Instructions  Discharge Instructions    Diet - low sodium heart healthy   Complete by:  As directed    Increase activity slowly   Complete by:  As directed      Allergies as of 03/07/2018      Reactions   Penicillins Rash, Other (See Comments)   Has patient had a PCN reaction causing immediate rash, facial/tongue/throat swelling, SOB or lightheadedness with hypotension: No Has patient had a PCN reaction causing severe rash involving mucus membranes or skin necrosis: No Has patient had a PCN reaction that required hospitalization No Has patient had a PCN reaction occurring within the last 10 years: No If all of the above answers are "NO", then may proceed with Cephalosporin use.      Medication List    STOP taking these medications   apixaban 2.5 MG Tabs tablet Commonly known as:  ELIQUIS   docusate sodium 100 MG capsule Commonly known as:  COLACE   flecainide 100 MG  tablet Commonly known as:  TAMBOCOR   hydrocortisone 25 MG suppository Commonly known as:  ANUSOL-HC   IBGARD PO   levETIRAcetam 500 MG tablet Commonly known as:  KEPPRA   magnesium hydroxide 400 MG/5ML suspension Commonly known as:  MILK OF MAGNESIA   mirtazapine 15 MG tablet Commonly known as:  REMERON   pantoprazole 40 MG tablet Commonly known as:   PROTONIX     TAKE these medications   ARTIFICIAL TEAR OP Apply 2 drops to eye daily as needed ([place]ry[place]). To the left eye   atorvastatin 20 MG tablet Commonly known as:  LIPITOR Take 1 tablet (20 mg total) by mouth at bedtime.   cephALEXin 500 MG capsule Commonly known as:  KEFLEX Take 1 capsule (500 mg total) by mouth 2 (two) times daily for 4 days.   digoxin 0.125 MG tablet Commonly known as:  LANOXIN Take 1 tablet (0.125 mg total) by mouth daily. What changed:  when to take this   diltiazem 180 MG 24 hr capsule Commonly known as:  CARDIZEM CD Take 1 capsule (180 mg total) by mouth daily. Start taking on:  March 08, 2018   donepezil 5 MG tablet Commonly known as:  ARICEPT Take 1 tablet (5 mg total) by mouth at bedtime. FOR MEMORY   edoxaban 30 MG Tabs tablet Commonly known as:  SAVAYSA Take 1 tablet (30 mg total) by mouth daily.   hydrALAZINE 10 MG tablet Commonly known as:  APRESOLINE Take 1 tablet (10 mg total) by mouth 2 (two) times daily.   levothyroxine 50 MCG tablet Commonly known as:  SYNTHROID, LEVOTHROID Take 1 tablet daily before breakfast What changed:    how much to take  how to take this  when to take this  additional instructions   metoprolol succinate 100 MG 24 hr tablet Commonly known as:  TOPROL-XL Take 1 tablet (100 mg total) by mouth daily. Take with or immediately following a meal. What changed:    when to take this  additional instructions   multivitamin with minerals Tabs tablet Take 1 tablet by mouth every morning.   phenytoin 100 MG ER capsule Commonly known as:  DILANTIN Take 1 capsule (100 mg total) by mouth 3 (three) times daily.   tamsulosin 0.4 MG Caps capsule Commonly known as:  FLOMAX Take 1 capsule (0.4 mg total) by mouth daily. Start taking on:  March 08, 2018   TYLENOL 325 MG Caps Generic drug:  Acetaminophen Take 650 mg by mouth every 6 (six) hours as needed for mild pain.       Contact information for  follow-up providers    Cameron Sprang, MD Follow up in 2 week(s).   Specialty:  Neurology Why:  please f/u in 2-4 weeks after discharge . Contact information: [address] [place] [address] [phone]        Alexis Frock, MD Follow up in 1 week(s).   Specialty:  Urology Contact information: [place] [address] [phone]            Contact information for after-discharge care    Destination    [facility] .   Service:  Skilled Nursing Contact information: [place] [phone]                 Allergies  Allergen Reactions  . Penicillins Rash and Other (See Comments)    Has patient had a PCN reaction  causing immediate rash, facial/tongue/throat swelling, SOB or lightheadedness with hypotension: No Has patient had a PCN reaction causing severe rash involving mucus membranes or skin necrosis: No Has patient had a PCN reaction that required hospitalization No Has patient had a PCN reaction occurring within the last 10 years: No If all of the above answers are "NO", then may proceed with Cephalosporin use.    Consultations: Cardiology Neurology   Procedures/Studies: Ct Angio Head W Or Wo Contrast  Result Date: 03/01/2018 CLINICAL DATA:  Left-sided weakness.  Confusion.  Unsteady gait. EXAM: CT ANGIOGRAPHY HEAD AND NECK CT PERFUSION BRAIN TECHNIQUE: Multidetector CT imaging of the head and neck was performed using the standard protocol during bolus administration of intravenous contrast. Multiplanar CT image reconstructions and MIPs were obtained to evaluate the vascular anatomy. Carotid stenosis measurements (when applicable) are obtained utilizing NASCET criteria, using the distal internal carotid diameter as the denominator. Multiphase CT imaging of the brain was performed following IV bolus contrast injection. Subsequent parametric perfusion maps  were calculated using RAPID software. CONTRAST:  159mL ISOVUE-370 IOPAMIDOL (ISOVUE-370) INJECTION 76% COMPARISON:  Head and neck CTA 01/30/2018. FINDINGS: CTA NECK FINDINGS Aortic arch: Standard 3 vessel aortic arch with mild atherosclerotic plaque. Widely patent arch vessel origins. Right carotid system: Patent with mild atherosclerotic plaque at the carotid bifurcation. No evidence of significant stenosis or dissection. Tortuous mid cervical ICA with kinked appearance. Left carotid system: Patent without evidence of significant stenosis or dissection. Tortuous mid cervical ICA with kinked appearance. Vertebral arteries: Patent and codominant without evidence of significant stenosis or dissection although motion artifact limits assessment of the proximal left V1 segment (this was widely patent on the prior CTA however). Skeleton: Moderate cervical disc degeneration with neural foraminal stenosis due to uncovertebral spurring most notable on the left at C4-5 and bilaterally at C5-6. Other neck: No mass or enlarged lymph nodes identified. Upper chest: Clear lung apices. Review of the MIP images confirms the above findings CTA HEAD FINDINGS Anterior circulation: The internal carotid arteries are widely patent from skull base to carotid termini with mild atherosclerotic plaque noted. A prominent right anterior choroidal artery is incidentally noted. ACAs and MCAs are patent without evidence of proximal branch occlusion or significant proximal stenosis. No aneurysm is identified. Posterior circulation: The intracranial vertebral arteries are widely patent to the basilar. Patent PICA and SCA origins are visualized bilaterally. The basilar artery is widely patent. Posterior communicating arteries are diminutive or absent. PCAs are patent without evidence of significant stenosis. No aneurysm is identified. Venous sinuses: Patent. Anatomic variants: None. Review of the MIP images confirms the above findings CT Brain  Perfusion Findings: CBF (<30%) Volume: 42mL Perfusion (Tmax>6.0s) volume: 75mL Mismatch Volume: 77mL Infarction Location:No core infarct identified by RAPID. The Tmax volume is present in the right cerebral hemisphere primarily in the MCA territory, however perfusion imaging is significantly motion degraded and multiple data points were discarded casting doubt on the reliability of this data. IMPRESSION: 1. Mild atherosclerosis in the head and neck without evidence of large vessel occlusion or significant stenosis. 2. Motion degraded perfusion imaging of questionable reliability as detailed above. No core infarct identified. These results were called by telephone at the time of interpretation on 03/01/2018 at 3:45 pm to Dr. Dayna Barker, who verbally acknowledged these results. Electronically Signed   By: Logan Bores M.D.   On: 03/01/2018 16:03   Ct Angio Neck W Or Wo Contrast  Result Date: 03/01/2018 CLINICAL DATA:  Left-sided weakness.  Confusion.  Unsteady  gait. EXAM: CT ANGIOGRAPHY HEAD AND NECK CT PERFUSION BRAIN TECHNIQUE: Multidetector CT imaging of the head and neck was performed using the standard protocol during bolus administration of intravenous contrast. Multiplanar CT image reconstructions and MIPs were obtained to evaluate the vascular anatomy. Carotid stenosis measurements (when applicable) are obtained utilizing NASCET criteria, using the distal internal carotid diameter as the denominator. Multiphase CT imaging of the brain was performed following IV bolus contrast injection. Subsequent parametric perfusion maps were calculated using RAPID software. CONTRAST:  159mL ISOVUE-370 IOPAMIDOL (ISOVUE-370) INJECTION 76% COMPARISON:  Head and neck CTA 01/30/2018. FINDINGS: CTA NECK FINDINGS Aortic arch: Standard 3 vessel aortic arch with mild atherosclerotic plaque. Widely patent arch vessel origins. Right carotid system: Patent with mild atherosclerotic plaque at the carotid bifurcation. No evidence of  significant stenosis or dissection. Tortuous mid cervical ICA with kinked appearance. Left carotid system: Patent without evidence of significant stenosis or dissection. Tortuous mid cervical ICA with kinked appearance. Vertebral arteries: Patent and codominant without evidence of significant stenosis or dissection although motion artifact limits assessment of the proximal left V1 segment (this was widely patent on the prior CTA however). Skeleton: Moderate cervical disc degeneration with neural foraminal stenosis due to uncovertebral spurring most notable on the left at C4-5 and bilaterally at C5-6. Other neck: No mass or enlarged lymph nodes identified. Upper chest: Clear lung apices. Review of the MIP images confirms the above findings CTA HEAD FINDINGS Anterior circulation: The internal carotid arteries are widely patent from skull base to carotid termini with mild atherosclerotic plaque noted. A prominent right anterior choroidal artery is incidentally noted. ACAs and MCAs are patent without evidence of proximal branch occlusion or significant proximal stenosis. No aneurysm is identified. Posterior circulation: The intracranial vertebral arteries are widely patent to the basilar. Patent PICA and SCA origins are visualized bilaterally. The basilar artery is widely patent. Posterior communicating arteries are diminutive or absent. PCAs are patent without evidence of significant stenosis. No aneurysm is identified. Venous sinuses: Patent. Anatomic variants: None. Review of the MIP images confirms the above findings CT Brain Perfusion Findings: CBF (<30%) Volume: 37mL Perfusion (Tmax>6.0s) volume: 23mL Mismatch Volume: 85mL Infarction Location:No core infarct identified by RAPID. The Tmax volume is present in the right cerebral hemisphere primarily in the MCA territory, however perfusion imaging is significantly motion degraded and multiple data points were discarded casting doubt on the reliability of this data.  IMPRESSION: 1. Mild atherosclerosis in the head and neck without evidence of large vessel occlusion or significant stenosis. 2. Motion degraded perfusion imaging of questionable reliability as detailed above. No core infarct identified. These results were called by telephone at the time of interpretation on 03/01/2018 at 3:45 pm to Dr. Dayna Barker, who verbally acknowledged these results. Electronically Signed   By: Logan Bores M.D.   On: 03/01/2018 16:03   Mr Brain Wo Contrast  Result Date: 03/03/2018 CLINICAL DATA:  Altered mental status EXAM: MRI HEAD WITHOUT CONTRAST TECHNIQUE: Multiplanar, multiecho pulse sequences of the brain and surrounding structures were obtained without intravenous contrast. COMPARISON:  CTA head neck 03/01/2018 FINDINGS: BRAIN: There is no acute infarct, acute hemorrhage, hydrocephalus or extra-axial collection. The midline structures are normal. No midline shift or other mass effect. Diffuse confluent hyperintense T2-weighted signal within the periventricular, deep and juxtacortical white matter, most commonly due to chronic ischemic microangiopathy. Generalized atrophy without lobar predilection. Susceptibility-sensitive sequences show no chronic microhemorrhage or superficial siderosis. Atrophic appearance of both medial temporal lobes. VASCULAR: Major intracranial arterial and venous  sinus flow voids are normal. SKULL AND UPPER CERVICAL SPINE: Calvarial bone marrow signal is normal. There is no skull base mass. Visualized upper cervical spine and soft tissues are normal. SINUSES/ORBITS: No fluid levels or advanced mucosal thickening. No mastoid or middle ear effusion. The orbits are normal. IMPRESSION: 1. No acute intracranial abnormality. 2. Atrophy and chronic ischemic microangiopathy. Electronically Signed   By: Ulyses Jarred M.D.   On: 03/03/2018 22:33   Ct Abdomen Pelvis W Contrast  Result Date: 03/05/2018 CLINICAL DATA:  Fever of unknown origin. EXAM: CT ABDOMEN AND PELVIS  WITH CONTRAST TECHNIQUE: Multidetector CT imaging of the abdomen and pelvis was performed using the standard protocol following bolus administration of intravenous contrast. CONTRAST:  140mL ISOVUE-300 IOPAMIDOL (ISOVUE-300) INJECTION 61% COMPARISON:  12/18/2017 FINDINGS: Lower chest: Linear atelectasis in the lung bases. Cardiac enlargement with particular right heart enlargement. Coronary artery calcifications. Small esophageal hiatal hernia. Hepatobiliary: Gallbladder is surgically absent. Intra and extrahepatic bile duct dilatation is likely postoperative. No focal liver lesions. Pancreas: Unremarkable. No pancreatic ductal dilatation or surrounding inflammatory changes. Spleen: 1.6 cm cyst in the upper pole of the spleen. Spleen size is normal. Adrenals/Urinary Tract: No adrenal gland nodules. Cyst in the upper pole right kidney. Nephrograms are homogeneous and symmetrical. No hydronephrosis or hydroureter. Bladder is distended but no wall thickening or filling defects are identified. Stomach/Bowel: Stomach, small bowel, and colon are not abnormally distended. There is a small diverticulum off of the posterior gastric cardia. Diverticulosis of the sigmoid colon with sigmoid colonic wall thickening similar to prior study, likely due to muscular hypertrophy. No definite evidence of diverticulitis. Residual contrast material in the colon. Appendix is normal. Vascular/Lymphatic: Aortic atherosclerosis. No enlarged abdominal or pelvic lymph nodes. Reproductive: Uterus and bilateral adnexa are unremarkable. Other: No free air or free fluid in the abdomen. Abdominal wall musculature appears intact. Musculoskeletal: Degenerative changes in the spine. No destructive bone lesions. IMPRESSION: 1. No acute process demonstrated in the abdomen or pelvis. No evidence of bowel obstruction or inflammation. 2. Diverticulosis of the sigmoid colon with sigmoid colonic wall thickening likely due to muscular hypertrophy. 3. Small  esophageal hiatal hernia. 4. Small gastric diverticulum. 5. Cardiac enlargement. 6. Intra and extrahepatic bile duct dilatation is likely postoperative after cholecystectomy. Electronically Signed   By: Lucienne Capers M.D.   On: 03/05/2018 03:30   Ct Cerebral Perfusion W Contrast  Result Date: 03/01/2018 CLINICAL DATA:  Left-sided weakness.  Confusion.  Unsteady gait. EXAM: CT ANGIOGRAPHY HEAD AND NECK CT PERFUSION BRAIN TECHNIQUE: Multidetector CT imaging of the head and neck was performed using the standard protocol during bolus administration of intravenous contrast. Multiplanar CT image reconstructions and MIPs were obtained to evaluate the vascular anatomy. Carotid stenosis measurements (when applicable) are obtained utilizing NASCET criteria, using the distal internal carotid diameter as the denominator. Multiphase CT imaging of the brain was performed following IV bolus contrast injection. Subsequent parametric perfusion maps were calculated using RAPID software. CONTRAST:  17mL ISOVUE-370 IOPAMIDOL (ISOVUE-370) INJECTION 76% COMPARISON:  Head and neck CTA 01/30/2018. FINDINGS: CTA NECK FINDINGS Aortic arch: Standard 3 vessel aortic arch with mild atherosclerotic plaque. Widely patent arch vessel origins. Right carotid system: Patent with mild atherosclerotic plaque at the carotid bifurcation. No evidence of significant stenosis or dissection. Tortuous mid cervical ICA with kinked appearance. Left carotid system: Patent without evidence of significant stenosis or dissection. Tortuous mid cervical ICA with kinked appearance. Vertebral arteries: Patent and codominant without evidence of significant stenosis or dissection although motion  artifact limits assessment of the proximal left V1 segment (this was widely patent on the prior CTA however). Skeleton: Moderate cervical disc degeneration with neural foraminal stenosis due to uncovertebral spurring most notable on the left at C4-5 and bilaterally at  C5-6. Other neck: No mass or enlarged lymph nodes identified. Upper chest: Clear lung apices. Review of the MIP images confirms the above findings CTA HEAD FINDINGS Anterior circulation: The internal carotid arteries are widely patent from skull base to carotid termini with mild atherosclerotic plaque noted. A prominent right anterior choroidal artery is incidentally noted. ACAs and MCAs are patent without evidence of proximal branch occlusion or significant proximal stenosis. No aneurysm is identified. Posterior circulation: The intracranial vertebral arteries are widely patent to the basilar. Patent PICA and SCA origins are visualized bilaterally. The basilar artery is widely patent. Posterior communicating arteries are diminutive or absent. PCAs are patent without evidence of significant stenosis. No aneurysm is identified. Venous sinuses: Patent. Anatomic variants: None. Review of the MIP images confirms the above findings CT Brain Perfusion Findings: CBF (<30%) Volume: 35mL Perfusion (Tmax>6.0s) volume: 23mL Mismatch Volume: 97mL Infarction Location:No core infarct identified by RAPID. The Tmax volume is present in the right cerebral hemisphere primarily in the MCA territory, however perfusion imaging is significantly motion degraded and multiple data points were discarded casting doubt on the reliability of this data. IMPRESSION: 1. Mild atherosclerosis in the head and neck without evidence of large vessel occlusion or significant stenosis. 2. Motion degraded perfusion imaging of questionable reliability as detailed above. No core infarct identified. These results were called by telephone at the time of interpretation on 03/01/2018 at 3:45 pm to Dr. Dayna Barker, who verbally acknowledged these results. Electronically Signed   By: Logan Bores M.D.   On: 03/01/2018 16:03   Dg Chest Port 1 View  Result Date: 03/04/2018 CLINICAL DATA:  History of stroke and hypertension.  Fever. EXAM: PORTABLE CHEST 1 VIEW  COMPARISON:  03/01/2018 and older exams. FINDINGS: Cardiac silhouette is normal in size.  No mediastinal or masses. Clear lungs.  No convincing pleural effusion.  No pneumothorax. Stable reverse left shoulder prosthesis. No acute skeletal abnormality. IMPRESSION: No acute cardiopulmonary disease. Electronically Signed   By: Lajean Manes M.D.   On: 03/04/2018 09:36   Dg Chest Port 1 View  Result Date: 03/01/2018 CLINICAL DATA:  Fever EXAM: PORTABLE CHEST 1 VIEW COMPARISON:  06/23/2017 FINDINGS: The heart size and mediastinal contours are within normal limits. Right basilar atelectasis. No pleural effusion or focal consolidation. Left total shoulder arthroplasty. The visualized skeletal structures are unremarkable. IMPRESSION: No active disease. Electronically Signed   By: Ulyses Jarred M.D.   On: 03/01/2018 23:20   Dg Swallowing Func-speech Pathology  Result Date: 03/03/2018 Objective Swallowing Evaluation: Type of Study: MBS-Modified Barium Swallow Study  Patient Details Name: [name] MRN: [id number] Date of Birth: 20-Aug-1932 Today's Date: 03/03/2018 Time: SLP Start Time (ACUTE ONLY): 1013 -SLP Stop Time (ACUTE ONLY): 1030 SLP Time Calculation (min) (ACUTE ONLY): 17 min Past Medical History: Past Medical History: Diagnosis Date . Anemia  . Atrial fibrillation (HCC)   Paroxysmal, Flecainide therapy . Calf pain   September, 2012, at rest . Carotid bruit   Doppler, December, 2009, no abnormality . Cataract  . Diverticulosis  . GERD (gastroesophageal reflux disease)  . History of shingles  . HLD (hyperlipidemia)  . HTN (hypertension)  . Hypothyroidism  . Insomnia  . Osteoarthritis  . PONV (postoperative nausea and vomiting)  . Rectal bleeding  2001  diverticulosis and int hemorrhoids on 07/1999 and 02/2010 colonoscopies. . Seizures ([place])  . Stroke ([place])  . Warfarin anticoagulation  Past Surgical History: Past Surgical History: Procedure Laterality Date . BIOPSY  09/29/2015  Procedure: BIOPSY;  Surgeon: Rogene Houston, MD;  Location: [facility];  Service: Endoscopy;;  gastric . CATARACT EXTRACTION W/PHACO Right 04/15/2014  Procedure: CATARACT EXTRACTION PHACO AND INTRAOCULAR LENS PLACEMENT (IOC);  Surgeon: Tonny Branch, MD;  Location: [facility];  Service: Ophthalmology;  Laterality: Right;  CDE: 13.30 . CATARACT EXTRACTION W/PHACO Left 05/13/2014  Procedure: CATARACT EXTRACTION PHACO AND INTRAOCULAR LENS PLACEMENT LEFT EYE;  Surgeon: Tonny Branch, MD;  Location: [facility];  Service: Ophthalmology;  Laterality: Left;  CDE:13.00 . CHOLECYSTECTOMY N/A 01/15/2015  Procedure: LAPAROSCOPIC CHOLECYSTECTOMY;  Surgeon: Ralene Ok, MD;  Location: [facility];  Service: General;  Laterality: N/A; . COLONOSCOPY N/A 09/29/2015  Procedure: COLONOSCOPY;  Surgeon: Rogene Houston, MD;  Location: [facility];  Service: Endoscopy;  Laterality: N/A; . COLONOSCOPY   . ESOPHAGOGASTRODUODENOSCOPY N/A 01/13/2015  Procedure: ESOPHAGOGASTRODUODENOSCOPY (EGD);  Surgeon: Jerene Bears, MD;  Location: [facility] ENDOSCOPY;  Service: Endoscopy;  Laterality: N/A; . ESOPHAGOGASTRODUODENOSCOPY N/A 09/29/2015  Procedure: ESOPHAGOGASTRODUODENOSCOPY (EGD);  Surgeon: Rogene Houston, MD;  Location: [facility] ENDO SUITE;  Service: Endoscopy;  Laterality: N/A;  12:15 . GALLBLADDER SURGERY   . LEFT HEART CATH AND CORONARY ANGIOGRAPHY N/A 06/22/2016  Procedure: Left Heart Cath and Coronary Angiography;  Surgeon: Troy Sine, MD;  Location: [place] CV LAB;  Service: Cardiovascular;  Laterality: N/A; . SHOULDER OPEN ROTATOR CUFF REPAIR    bilateral . TOTAL KNEE ARTHROPLASTY  05/26/10  right . TOTAL KNEE ARTHROPLASTY  11/17/2010  Procedure: TOTAL KNEE ARTHROPLASTY;  Surgeon: Mauri Pole;  Location: [facility] ORS;  Service: Orthopedics;  Laterality: Left; . TOTAL KNEE ARTHROPLASTY    Right . TOTAL SHOULDER REPLACEMENT  01/2010  left HPI: LANETTA FIGUERO is a 85 y.o. female with medical history significant for paroxysmal A. Fib/atrial flutter, diastolic CHF, HTN, CVA, dementia, who presented to  the ED after reports of initial confusion earlier today, patient's aide was with her, and reports that it appeared patient did not know how to use her walker, kept walking into the walls with a walker, her speech did not make sense also, this began at about 1030 today.  It was also noted that she was not moving her left side-about 1:30 PM.  Patient was also still confused.  Patients niece reports significant jecking of all patient's extremities, she is adamant and sure it was not tremors, reports her husband has Parkinson's and she knows the difference. BSE requested  Subjective: "Mmhmmm." Assessment / Plan / Recommendation CHL IP CLINICAL IMPRESSIONS 03/03/2018 Clinical Impression Pt exhibited minimal oral dysphagia with discoordination of control and transit primarily with thin. Protective mechanisms including hyoid excursion, epiglottic retroflexion, arytenoid cartilidges approximation to epiglottic petiole were functional. Flash laryngeal penetration with straw present which is not considered abnormal. No significant residue and esophageal scan unremarkable (MBS only diagnosis above level of UES). Recommend regular texture, thin liquids, pills whole in applesauce, straws allowed and full supervision initially. ST will briefly follow.   SLP Visit Diagnosis Dysphagia, oral phase (R13.11) Attention and concentration deficit following -- Frontal lobe and executive function deficit following -- Impact on safety and function Mild aspiration risk   CHL IP TREATMENT RECOMMENDATION 03/03/2018 Treatment Recommendations Therapy as outlined in treatment plan below   Prognosis 03/03/2018 Prognosis for Safe Diet Advancement Good Barriers  to Reach Goals -- Barriers/Prognosis Comment -- CHL IP DIET RECOMMENDATION 03/03/2018 SLP Diet Recommendations Regular solids;Thin liquid Liquid Administration via Cup;Straw Medication Administration Whole meds with puree Compensations Slow rate;Small sips/bites Postural Changes Seated upright at  90 degrees   CHL IP OTHER RECOMMENDATIONS 03/03/2018 Recommended Consults -- Oral Care Recommendations Oral care BID Other Recommendations --   CHL IP FOLLOW UP RECOMMENDATIONS 03/03/2018 Follow up Recommendations Skilled Nursing facility   [facility] IP FREQUENCY AND DURATION 03/03/2018 Speech Therapy Frequency (ACUTE ONLY) min 2x/week Treatment Duration 1 week      CHL IP ORAL PHASE 03/03/2018 Oral Phase Impaired Oral - Pudding Teaspoon -- Oral - Pudding Cup -- Oral - Honey Teaspoon -- Oral - Honey Cup -- Oral - Nectar Teaspoon -- Oral - Nectar Cup -- Oral - Nectar Straw -- Oral - Thin Teaspoon -- Oral - Thin Cup Reduced posterior propulsion Oral - Thin Straw Reduced posterior propulsion Oral - Puree -- Oral - Mech Soft -- Oral - Regular WFL Oral - Multi-Consistency -- Oral - Pill -- Oral Phase - Comment --  CHL IP PHARYNGEAL PHASE 03/03/2018 Pharyngeal Phase Impaired Pharyngeal- Pudding Teaspoon -- Pharyngeal -- Pharyngeal- Pudding Cup -- Pharyngeal -- Pharyngeal- Honey Teaspoon -- Pharyngeal -- Pharyngeal- Honey Cup -- Pharyngeal -- Pharyngeal- Nectar Teaspoon -- Pharyngeal -- Pharyngeal- Nectar Cup -- Pharyngeal -- Pharyngeal- Nectar Straw -- Pharyngeal -- Pharyngeal- Thin Teaspoon -- Pharyngeal -- Pharyngeal- Thin Cup WFL Pharyngeal -- Pharyngeal- Thin Straw Penetration/Aspiration during swallow Pharyngeal Material enters airway, remains ABOVE vocal cords then ejected out Pharyngeal- Puree -- Pharyngeal -- Pharyngeal- Mechanical Soft -- Pharyngeal -- Pharyngeal- Regular WFL Pharyngeal -- Pharyngeal- Multi-consistency -- Pharyngeal -- Pharyngeal- Pill -- Pharyngeal -- Pharyngeal Comment --  CHL IP CERVICAL ESOPHAGEAL PHASE 03/03/2018 Cervical Esophageal Phase (No Data) Pudding Teaspoon -- Pudding Cup -- Honey Teaspoon -- Honey Cup -- Nectar Teaspoon -- Nectar Cup -- Nectar Straw -- Thin Teaspoon -- Thin Cup -- Thin Straw -- Puree -- Mechanical Soft -- Regular -- Multi-consistency -- Pill -- Cervical Esophageal Comment --  Houston Siren 03/03/2018, 1:36 PM Orbie Pyo Litaker M.Ed Actor Pager [phone] Office [phone]              Ct Head Code Stroke Wo Contrast  Result Date: 03/01/2018 CLINICAL DATA:  Code stroke. Left-sided weakness. Left facial droop. EXAM: CT HEAD WITHOUT CONTRAST TECHNIQUE: Contiguous axial images were obtained from the base of the skull through the vertex without intravenous contrast. COMPARISON:  MRI 01/30/2018, CT head 01/30/2018 FINDINGS: Brain: Negative for acute infarct.  Negative for hemorrhage or mass Generalized atrophy. Chronic microvascular ischemic changes in the white matter are moderately advanced since stable. Vascular: Negative for hyperdense vessel Skull: Negative Sinuses/Orbits: Paranasal sinuses clear. Bilateral cataract surgery. Other: None ASPECTS ([place] Stroke Program Early CT Score) - Ganglionic level infarction (caudate, lentiform nuclei, internal capsule, insula, M1-M3 cortex): 7 - Supraganglionic infarction (M4-M6 cortex): 3 Total score (0-10 with 10 being normal): 10 IMPRESSION: 1. No acute intracranial abnormality. 2. Atrophy and chronic microvascular ischemic changes stable. 3. ASPECTS is 10 4. These results were called by telephone at the time of interpretation on 03/01/2018 at 2:03 pm to Dr. Thurnell Garbe, who verbally acknowledged these results. Electronically Signed   By: Franchot Gallo M.D.   On: 03/01/2018 14:04     Subjective: Alert, in no distress. oriented to person.  Denies pain.   Discharge Exam: Vitals:   03/07/18 0421 03/07/18 0825  BP: (!) 145/66 (!) 190/92  Pulse: Marland Kitchen)  106 80  Resp: 16 20  Temp: 98 F (36.7 C) 98.4 F (36.9 C)  SpO2: 97% 95%   Vitals:   03/06/18 1603 03/07/18 0000 03/07/18 0421 03/07/18 0825  BP: (!) 154/112 (!) 142/74 (!) 145/66 (!) 190/92  Pulse:  (!) 139 (!) 106 80  Resp:  16 16 20   Temp:  (!) 97.4 F (36.3 C) 98 F (36.7 C) 98.4 F (36.9 C)  TempSrc:  Axillary Axillary Oral  SpO2: 94% 98%  97% 95%  Weight:      Height:        General: Pt is alert, awake, not in acute distress, confuse Cardiovascular: RRR, S1/S2 +, no rubs, no gallops Respiratory: CTA bilaterally, no wheezing, no rhonchi Abdominal: Soft, NT, ND, bowel sounds + Extremities: no edema, no cyanosis    The results of significant diagnostics from this hospitalization (including imaging, microbiology, ancillary and laboratory) are listed below for reference.     Microbiology: Recent Results (from the past 240 hour(s))  Culture, Urine     Status: Abnormal   Collection Time: 03/01/18  8:23 PM  Result Value Ref Range Status   Specimen Description   Final    URINE, RANDOM Performed at [facility], [address], [place], [place] [address]    Special Requests   Final    NONE Performed at [facility], [address] [place], [place] [address]    Culture MULTIPLE SPECIES PRESENT, SUGGEST RECOLLECTION (A)  Final   Report Status 03/03/2018 FINAL  Final  Culture, blood (routine x 2)     Status: None   Collection Time: 03/01/18  8:56 PM  Result Value Ref Range Status   Specimen Description BLOOD RIGHT ARM  Final   Special Requests   Final    BOTTLES DRAWN AEROBIC AND ANAEROBIC Blood Culture adequate volume   Culture   Final    NO GROWTH 5 DAYS Performed at [facility], [address] [place], [place] [address]    Report Status 03/06/2018 FINAL  Final  Culture, blood (routine x 2)     Status: None   Collection Time: 03/01/18  8:58 PM  Result Value Ref Range Status   Specimen Description BLOOD RIGHT HAND  Final   Special Requests   Final    BOTTLES DRAWN AEROBIC AND ANAEROBIC Blood Culture adequate volume   Culture   Final    NO GROWTH 5 DAYS Performed at [facility], [address], [place], [place] [address]    Report Status 03/06/2018 FINAL  Final  Urine Culture     Status: None   Collection Time: 03/04/18  7:23 AM  Result Value Ref Range Status   Specimen Description URINE, RANDOM  Final    Special Requests NONE  Final   Culture   Final    NO GROWTH Performed at [facility], [place] [address] [place], [place] [address]    Report Status 03/05/2018 FINAL  Final  MRSA PCR Screening     Status: Abnormal   Collection Time: 03/04/18  7:24 AM  Result Value Ref Range Status   MRSA by PCR POSITIVE (A) NEGATIVE Final    Comment:        The GeneXpert MRSA Assay (FDA approved for NASAL specimens only), is one component of a comprehensive MRSA colonization surveillance program. It is not intended to diagnose MRSA infection nor to guide or monitor treatment for MRSA infections. CRITICAL RESULT CALLED TO, READ BACK BY AND VERIFIED WITH: RN PRESLEU,  JULIE [id number] FCP   Culture, blood (routine x 2)     Status: None (Preliminary result)   Collection Time: 03/04/18  8:08 AM  Result Value Ref Range Status   Specimen Description BLOOD LEFT HAND  Final   Special Requests   Final    AEROBIC BOTTLE ONLY Blood Culture results may not be optimal due to an excessive volume of blood received in culture bottles Performed at [facility] [address] [place], [place] [address]    Culture NO GROWTH 2 DAYS  Final   Report Status PENDING  Incomplete  Culture, blood (routine x 2)     Status: None (Preliminary result)   Collection Time: 03/04/18  8:14 AM  Result Value Ref Range Status   Specimen Description BLOOD RIGHT HAND  Final   Special Requests   Final    AEROBIC BOTTLE ONLY Blood Culture adequate volume Performed at [facility] [address] [place], [place] [address]    Culture NO GROWTH 2 DAYS  Final   Report Status PENDING  Incomplete  Respiratory Panel by PCR     Status: None   Collection Time: 03/04/18 12:07 PM  Result Value Ref Range Status   Adenovirus NOT DETECTED NOT DETECTED Final   Coronavirus 229E NOT DETECTED NOT DETECTED Final    Comment: (NOTE) The Coronavirus on the Respiratory Panel, DOES NOT test for the novel  Coronavirus (2019  nCoV)    Coronavirus HKU1 NOT DETECTED NOT DETECTED Final   Coronavirus NL63 NOT DETECTED NOT DETECTED Final   Coronavirus OC43 NOT DETECTED NOT DETECTED Final   Metapneumovirus NOT DETECTED NOT DETECTED Final   Rhinovirus / Enterovirus NOT DETECTED NOT DETECTED Final   Influenza A NOT DETECTED NOT DETECTED Final   Influenza B NOT DETECTED NOT DETECTED Final   Parainfluenza Virus 1 NOT DETECTED NOT DETECTED Final   Parainfluenza Virus 2 NOT DETECTED NOT DETECTED Final   Parainfluenza Virus 3 NOT DETECTED NOT DETECTED Final   Parainfluenza Virus 4 NOT DETECTED NOT DETECTED Final   Respiratory Syncytial Virus NOT DETECTED NOT DETECTED Final   Bordetella pertussis NOT DETECTED NOT DETECTED Final   Chlamydophila pneumoniae NOT DETECTED NOT DETECTED Final   Mycoplasma pneumoniae NOT DETECTED NOT DETECTED Final    Comment: Performed at [facility], [place] [address] [place], [place] [address]     Labs: BNP (last 3 results) No results for input(s): BNP in the last 8760 hours. Basic Metabolic Panel: Recent Labs  Lab 03/02/18 0244 03/02/18 0842 03/04/18 0754 03/05/18 0504 03/06/18 0850 03/07/18 0605  NA  --  134* 129* 132* 135 133*  K  --  3.8 3.5 3.8 2.8* 3.4*  CL  --  98 92* 96* 100 99  CO2  --  27 22 23 23 24   GLUCOSE  --  96 82 108* 128* 97  BUN  --  11 14 13  6* <5*  CREATININE  --  0.49 0.58 0.64 0.39* 0.48  CALCIUM  --  8.4* 8.3* 8.3* 8.1* 8.5*  MG 1.9  --   --   --   --   --    Liver Function Tests: Recent Labs  Lab 03/01/18 1410  AST 22  ALT 15  ALKPHOS 64  BILITOT 0.5  PROT 6.7  ALBUMIN 3.7   No results for input(s): LIPASE, AMYLASE in the last 168 hours. Recent Labs  Lab 03/03/18 0803  AMMONIA 10   CBC: Recent Labs  Lab 03/01/18 1353 03/02/18 0842 03/04/18 0754 03/05/18 0504 03/06/18 0850  WBC 7.8 8.4 8.5 9.8 6.9  NEUTROABS 4.9  --   --   --   --   HGB 12.8 13.0 14.4 15.1* 12.6  HCT 40.5 40.5 42.6 44.8 37.2  MCV 93.5 95.5 89.5 89.4 89.6   PLT 271 256 283 272 283   Cardiac Enzymes: Recent Labs  Lab 03/02/18 0244 03/02/18 0842 03/02/18 1458  TROPONINI 0.05* 0.04* <0.03   BNP: Invalid input(s): POCBNP CBG: Recent Labs  Lab 03/01/18 1403 03/03/18 0431 03/04/18 1045  GLUCAP 167* 75 86   D-Dimer No results for input(s): DDIMER in the last 72 hours. Hgb A1c No results for input(s): HGBA1C in the last 72 hours. Lipid Profile No results for input(s): CHOL, HDL, LDLCALC, TRIG, CHOLHDL, LDLDIRECT in the last 72 hours. Thyroid function studies No results for input(s): TSH, T4TOTAL, T3FREE, THYROIDAB in the last 72 hours.  Invalid input(s): FREET3 Anemia work up No results for input(s): VITAMINB12, FOLATE, FERRITIN, TIBC, IRON, RETICCTPCT in the last 72 hours. Urinalysis    Component Value Date/Time   COLORURINE YELLOW 03/04/2018 1131   APPEARANCEUR CLEAR 03/04/2018 1131   APPEARANCEUR Clear 01/06/2018 1507   LABSPEC 1.026 03/04/2018 1131   PHURINE 5.0 03/04/2018 [place] 03/04/2018 1131   HGBUR MODERATE (A) 03/04/2018 [place] 03/04/2018 1131   BILIRUBINUR Negative 01/06/2018 1507   KETONESUR 80 (A) 03/04/2018 1131   PROTEINUR 30 (A) 03/04/2018 1131   UROBILINOGEN 0.2 11/09/2010 1235   NITRITE NEGATIVE 03/04/2018 1131   LEUKOCYTESUR NEGATIVE 03/04/2018 1131   Sepsis Labs Invalid input(s): PROCALCITONIN,  WBC,  LACTICIDVEN Microbiology Recent Results (from the past 240 hour(s))  Culture, Urine     Status: Abnormal   Collection Time: 03/01/18  8:23 PM  Result Value Ref Range Status   Specimen Description   Final    URINE, RANDOM Performed at [facility], [address], [place], [place] [address]    Special Requests   Final    NONE Performed at [facility], [address] [place], [place] [address]    Culture MULTIPLE SPECIES PRESENT, SUGGEST RECOLLECTION (A)  Final   Report Status 03/03/2018 FINAL  Final  Culture, blood (routine x 2)     Status: None    Collection Time: 03/01/18  8:56 PM  Result Value Ref Range Status   Specimen Description BLOOD RIGHT ARM  Final   Special Requests   Final    BOTTLES DRAWN AEROBIC AND ANAEROBIC Blood Culture adequate volume   Culture   Final    NO GROWTH 5 DAYS Performed at [facility], [address], [place], [place] [address]    Report Status 03/06/2018 FINAL  Final  Culture, blood (routine x 2)     Status: None   Collection Time: 03/01/18  8:58 PM  Result Value Ref Range Status   Specimen Description BLOOD RIGHT HAND  Final   Special Requests   Final    BOTTLES DRAWN AEROBIC AND ANAEROBIC Blood Culture adequate volume   Culture   Final    NO GROWTH 5 DAYS Performed at [facility], [address], [place], [place] [address]    Report Status 03/06/2018 FINAL  Final  Urine Culture     Status: None   Collection Time: 03/04/18  7:23 AM  Result Value Ref Range Status   Specimen Description URINE, RANDOM  Final   Special Requests NONE  Final  Culture   Final    NO GROWTH Performed at [facility], [place] [address] [place], [place] [address]    Report Status 03/05/2018 FINAL  Final  MRSA PCR Screening     Status: Abnormal   Collection Time: 03/04/18  7:24 AM  Result Value Ref Range Status   MRSA by PCR POSITIVE (A) NEGATIVE Final    Comment:        The GeneXpert MRSA Assay (FDA approved for NASAL specimens only), is one component of a comprehensive MRSA colonization surveillance program. It is not intended to diagnose MRSA infection nor to guide or monitor treatment for MRSA infections. CRITICAL RESULT CALLED TO, READ BACK BY AND VERIFIED WITH: RN Oneida Arenas [id number] FCP   Culture, blood (routine x 2)     Status: None (Preliminary result)   Collection Time: 03/04/18  8:08 AM  Result Value Ref Range Status   Specimen Description BLOOD LEFT HAND  Final   Special Requests   Final    AEROBIC BOTTLE ONLY Blood Culture results may not be optimal due to an excessive volume of  blood received in culture bottles Performed at [place] [address], [place], [place] [address]    Culture NO GROWTH 2 DAYS  Final   Report Status PENDING  Incomplete  Culture, blood (routine x 2)     Status: None (Preliminary result)   Collection Time: 03/04/18  8:14 AM  Result Value Ref Range Status   Specimen Description BLOOD RIGHT HAND  Final   Special Requests   Final    AEROBIC BOTTLE ONLY Blood Culture adequate volume Performed at [facility] [address] [place], [place] [address]    Culture NO GROWTH 2 DAYS  Final   Report Status PENDING  Incomplete  Respiratory Panel by PCR     Status: None   Collection Time: 03/04/18 12:07 PM  Result Value Ref Range Status   Adenovirus NOT DETECTED NOT DETECTED Final   Coronavirus 229E NOT DETECTED NOT DETECTED Final    Comment: (NOTE) The Coronavirus on the Respiratory Panel, DOES NOT test for the novel  Coronavirus (2019 nCoV)    Coronavirus HKU1 NOT DETECTED NOT DETECTED Final   Coronavirus NL63 NOT DETECTED NOT DETECTED Final   Coronavirus OC43 NOT DETECTED NOT DETECTED Final   Metapneumovirus NOT DETECTED NOT DETECTED Final   Rhinovirus / Enterovirus NOT DETECTED NOT DETECTED Final   Influenza A NOT DETECTED NOT DETECTED Final   Influenza B NOT DETECTED NOT DETECTED Final   Parainfluenza Virus 1 NOT DETECTED NOT DETECTED Final   Parainfluenza Virus 2 NOT DETECTED NOT DETECTED Final   Parainfluenza Virus 3 NOT DETECTED NOT DETECTED Final   Parainfluenza Virus 4 NOT DETECTED NOT DETECTED Final   Respiratory Syncytial Virus NOT DETECTED NOT DETECTED Final   Bordetella pertussis NOT DETECTED NOT DETECTED Final   Chlamydophila pneumoniae NOT DETECTED NOT DETECTED Final   Mycoplasma pneumoniae NOT DETECTED NOT DETECTED Final    Comment: Performed at [facility], [place] [address], [place],  [address]     Time coordinating discharge: 45 minutes.  SIGNED:   Elmarie Shiley, MD  [facility]

## 2018-03-07 NOTE — Progress Notes (Signed)
Patient discharged via [facility], to [facility]. PT had no personal belongings.

## 2018-03-07 NOTE — Clinical Social Work Placement (Signed)
Nurse to call report to [phone], Room 3209   Transport set for 4:00 PM.     CLINICAL SOCIAL WORK PLACEMENT  NOTE  Date:  03/07/2018  Patient Details  Name: [name] MRN: [id number] Date of Birth: 10/07/1932  Clinical Social Work is seeking post-discharge placement for this patient at the [address] level of care (*CSW will initial, date and re-position this form in  chart as items are completed):  Yes   Patient/family provided with [place] Work Department's list of facilities offering this level of care within the geographic area requested by the patient (or if unable, by the patient's family).  Yes   Patient/family informed of their freedom to choose among providers that offer the needed level of care, that participate in Medicare, Medicaid or managed care program needed by the patient, have an available bed and are willing to accept the patient.  Yes   Patient/family informed of [facility]'s ownership interest in [facility] and [facility], as well as of the fact that they are under no obligation to receive care at these facilities.  PASRR submitted to EDS on       PASRR number received on       Existing PASRR number confirmed on       FL2 transmitted to all facilities in geographic area requested by pt/family on       FL2 transmitted to all facilities within larger geographic area on       Patient informed that his/her managed care company has contracts with or will negotiate with certain facilities, including the following:        Yes   Patient/family informed of bed offers received.  Patient chooses bed at [facility]     Physician recommends and patient chooses bed at      Patient to be transferred to [facility] on 03/07/18.  Patient to be transferred to facility by PTAR     Patient family notified on 03/07/18 of transfer.  Name of family member notified:  Ray, son      PHYSICIAN       Additional Comment:    _______________________________________________ Geralynn Ochs, LCSW 03/07/2018, 2:11 PM

## 2018-03-07 NOTE — Progress Notes (Addendum)
   Chart reviewed, no new events. HR controlled current regimen.  [facility] will sign off.   Medication Recommendations:  Continue metoprolol, diltiazem, digoxin, and plan to change to edoxaban as outlined at discharge Other recommendations (labs, testing, etc):  Will need IM/primary care management of hypokalemia and hyponatremia Follow up as an outpatient:  As previously scheduled 4/1 at 3:20pm with Dr. Percival [facility])  Melina Copa PA-C

## 2018-03-08 ENCOUNTER — Ambulatory Visit: Payer: Medicare Other | Admitting: Family Medicine

## 2018-03-08 DIAGNOSIS — R338 Other retention of urine: Secondary | ICD-10-CM | POA: Diagnosis not present

## 2018-03-08 DIAGNOSIS — I4819 Other persistent atrial fibrillation: Secondary | ICD-10-CM | POA: Diagnosis not present

## 2018-03-09 LAB — CULTURE, BLOOD (ROUTINE X 2)
Culture: NO GROWTH
Culture: NO GROWTH
Special Requests: ADEQUATE

## 2018-03-10 DIAGNOSIS — I10 Essential (primary) hypertension: Secondary | ICD-10-CM | POA: Diagnosis not present

## 2018-03-10 DIAGNOSIS — I4891 Unspecified atrial fibrillation: Secondary | ICD-10-CM | POA: Diagnosis not present

## 2018-03-10 DIAGNOSIS — G309 Alzheimer's disease, unspecified: Secondary | ICD-10-CM | POA: Diagnosis not present

## 2018-03-10 DIAGNOSIS — G9340 Encephalopathy, unspecified: Secondary | ICD-10-CM | POA: Diagnosis not present

## 2018-03-13 ENCOUNTER — Encounter: Payer: Self-pay | Admitting: Neurology

## 2018-03-14 ENCOUNTER — Encounter: Payer: Self-pay | Admitting: *Deleted

## 2018-03-14 ENCOUNTER — Other Ambulatory Visit: Payer: Self-pay | Admitting: *Deleted

## 2018-03-14 NOTE — Patient Outreach (Signed)
[facility]) Care Management  03/14/2018  [name] 05/17/1932 [id number]   CSW was able to meet with patient and patient's son, [name] today, at [facility] [place] where patient currently resides to receive short-term rehabilitative services, to perform the initial assessment, as well as assess and assist with social work needs and services.  CSW introduced self, explained role and types of services provided through [facility] ([facility]).  CSW further explained to patient that CSW works with [name], [facility], also with [facility]. CSW then explained the reason for the visit, indicating that Mrs. Tamala Julian thought that patient would benefit from social work services and resources to assist with discharge planning needs and services from the skilled nursing facility.  CSW obtained two HIPAA compliant identifiers from patient, which included patient's name and date of birth.  Mr. Lukens did most of the talking during the visit today, as patient just appeared to be confused during the entire visit.  Patient did speak up when discussing her discharge disposition, indicating that she plans to return home to live with her sister at time of discharge.  Mr. Abramo reported that patient currently has a caregiver that stays with her in the home, 7 days per week, which he plans to resume once patient returns home.  Mr. Muston indicated that he, along with patient's sister, are able to assist patient with activities of daily living, meal preparation, light housekeeping duties, medication administration and management, grocery shopping, transportation to and from physician appointments, etc.  Mr. Wysocki is also agreeable to having CSW arrange for patient to receive home health services for patient, if recommended by the physical therapist and occupational therapist at the  facility.  Mr. Mersch is hopeful that patient will no longer require a catheter upon returning home, as Mr. Sarracino does not believe that patient would be able to manage this properly at home.  CSW agreed to follow-up with Mr. Abundis again next week to obtain an update on patient, as well as assess and assist with discharge planning needs and services.  Nat Christen, BSW, MSW, LCSW  Licensed Education officer, [facility]  Mailing [place] [address], [place], [place] [address] Physical Address-[address], [place], [place] [address] Toll Free Main # [phone] Fax # [phone] Cell # [phone]  Office # [phone] [email or web address]

## 2018-03-15 DIAGNOSIS — E038 Other specified hypothyroidism: Secondary | ICD-10-CM | POA: Diagnosis not present

## 2018-03-15 DIAGNOSIS — R338 Other retention of urine: Secondary | ICD-10-CM | POA: Diagnosis not present

## 2018-03-15 DIAGNOSIS — I4819 Other persistent atrial fibrillation: Secondary | ICD-10-CM | POA: Diagnosis not present

## 2018-03-20 DIAGNOSIS — R338 Other retention of urine: Secondary | ICD-10-CM | POA: Diagnosis not present

## 2018-03-20 DIAGNOSIS — N281 Cyst of kidney, acquired: Secondary | ICD-10-CM | POA: Diagnosis not present

## 2018-03-21 ENCOUNTER — Other Ambulatory Visit: Payer: Self-pay | Admitting: *Deleted

## 2018-03-21 NOTE — Patient Outreach (Signed)
[facility]) Care Management  03/21/2018  COLTON ENGDAHL [birth year] [id number]   CSW was able to make contact with patient today to follow-up regarding her stay at [facility] [place] where patient currently resides to receive short-term rehabilitative services.  Patient reported that the staff are treating her well, but that she hates being on "lock down", due to the Coronavirus.  Patient regrets that her son, Azaylea Maves is unable to visit her at the facility, as she reported that his visits were the highlight of her day.  Patient is under the assumption that she will be discharged from [facility] next Wednesday, March 29, 2018, as this is when her 20 days of skilled care will be up.  Patient continues to endorse that she will be returning home to live independently, at time of discharge.  Patient reported that Mr. Martinezgarcia has already made arrangements for patient's in-home caregiver services to be resumed on Thursday, March 30, 2018.  Patient is also agreeable to home health services being arranged for her through an agency of her choice.  CSW will contact patient again on Tuesday, March 28, 2018 to assess and assist with any additional discharge planning needs and services.  Patient believes she already has all the durable medical equipment she needs in the home.  Patient appeared to be experiencing cognitive challenges today, having difficulty forming her words and taking a great deal of time to compose sentences.  This was definitely causing patient some frustration and irritability.  CSW expressed gratitude toward patient for taking the time to speak with CSW.  Nat Christen, BSW, MSW, LCSW  Licensed Education officer, environmental Health System  Mailing [place] [address], [place], [place] [address] Physical Address-[address], [place],  [address] Toll Free Main #  [phone] Fax # [phone] Cell # [phone]  Office # [phone] [email or web address]

## 2018-03-22 DIAGNOSIS — I10 Essential (primary) hypertension: Secondary | ICD-10-CM | POA: Diagnosis not present

## 2018-03-22 DIAGNOSIS — R338 Other retention of urine: Secondary | ICD-10-CM | POA: Diagnosis not present

## 2018-03-22 DIAGNOSIS — I4819 Other persistent atrial fibrillation: Secondary | ICD-10-CM | POA: Diagnosis not present

## 2018-03-25 DIAGNOSIS — I10 Essential (primary) hypertension: Secondary | ICD-10-CM | POA: Diagnosis not present

## 2018-03-25 DIAGNOSIS — E038 Other specified hypothyroidism: Secondary | ICD-10-CM | POA: Diagnosis not present

## 2018-03-25 DIAGNOSIS — G9341 Metabolic encephalopathy: Secondary | ICD-10-CM | POA: Diagnosis not present

## 2018-03-28 ENCOUNTER — Other Ambulatory Visit: Payer: Self-pay | Admitting: *Deleted

## 2018-03-28 DIAGNOSIS — R338 Other retention of urine: Secondary | ICD-10-CM | POA: Diagnosis not present

## 2018-03-28 DIAGNOSIS — I10 Essential (primary) hypertension: Secondary | ICD-10-CM | POA: Diagnosis not present

## 2018-03-28 DIAGNOSIS — I4819 Other persistent atrial fibrillation: Secondary | ICD-10-CM | POA: Diagnosis not present

## 2018-03-28 NOTE — Patient Outreach (Signed)
[facility]) Care Management  03/28/2018  [name] 12/01/1932 [id number]   CSW was able to make contact with patient's son, Ainslie Mazurek today to follow-up regarding discharge planning needs and arrangements for patient, as patient currently resides at [facility] [place], to receive short-term rehabilitative services.  Mr. Lastra reported that a Discharge Planning Meeting is scheduled for patient tomorrow at [facility], but that he is unable to attend, as the facility is currently on "lock down", due to the Coronavirus.  CSW agreed to contact [name], Veterinary surgeon at [facility], after [employer], to obtain an update, and then report findings to Mr. Boakye.  Mr. Lydon indicated that he would like to resume home health services for patient through [facility], which will include a nurse, physical therapist, occupational therapist and an aide.  Mr. Abood went on to say that he will make arrangements for patient's in-home care services to be resumed, 7 hours per day, 7 days per week, through patient's private pay in-home care aide.  Patient's sister, with whom patient currently resides, will ensure that patient receives 24 hour care and supervision, as well as assist with medication management/administration, meal preparation, light housekeeping duties, etc.  Mr. Cavenaugh will continue to transport patient to and from all her physician appointments.  CSW agreed to contact Mr. Paule as soon as a return call is received from [place] with [facility].  CSW will also follow-up with Mr. Tellado again on Friday, March 31, 2018 to confirm patient's discharge date and discharge planning arrangements.  CSW will notify Deloria Lair, Geriatric Nurse Practitioner with [facility], of patient's definitive discharge date and plans so that she can resume community case management  services with patient.  CSW will ensure that Mr. Bloch is aware of all of patient's upcoming follow-up appointments with various physicians.  Nat Christen, BSW, MSW, LCSW  Licensed Education officer, environmental Health System  Mailing [place] [address], [place], [place] [address] Physical Address-[address], [place], [place] [address] Toll Free Main # [phone] Fax # [phone] Cell # [phone]  Office # [phone] [email or web address]

## 2018-03-31 ENCOUNTER — Other Ambulatory Visit: Payer: Self-pay | Admitting: *Deleted

## 2018-03-31 NOTE — Patient Outreach (Addendum)
[facility]) Care Management  03/31/2018  [name] Sep 07, 1932 [id number]   CSW was able to make contact with patient's son, [name] today to follow-up regarding discharge planning arrangements for patient, as patient currently resides in a skilled nursing facility, [place], to receive short-term rehabilitative services.  [name] reported that after yesterday's Discharge Planning Meeting at [facility], it was decided that patient will be discharged home on Tuesday, April 04, 2018.  [name] indicated that he was able to confirm with [name], Social Curator at [facility], that home health services will be resumed through [facility], beginning next Wednesday, April 05, 2018.  These services will include nursing and physical therapy.  No additional durable medical equipment needs have been identified at this time.  CSW agreed to follow-up with [name] again next Wednesday, April 04, 2018, around [place], BSW, MSW, [place]  Licensed Clinical Social Worker  [place]  Mailing [place] [address] [place], [place] [address] Physical Address-[address], [place], [place] [address] Toll Free Main # [phone] Fax # [phone] Cell # [phone]  Office # [phone] [email or web address]

## 2018-04-02 DIAGNOSIS — G9341 Metabolic encephalopathy: Secondary | ICD-10-CM | POA: Diagnosis not present

## 2018-04-02 DIAGNOSIS — E038 Other specified hypothyroidism: Secondary | ICD-10-CM | POA: Diagnosis not present

## 2018-04-02 DIAGNOSIS — E7849 Other hyperlipidemia: Secondary | ICD-10-CM | POA: Diagnosis not present

## 2018-04-04 ENCOUNTER — Other Ambulatory Visit: Payer: Self-pay | Admitting: *Deleted

## 2018-04-04 ENCOUNTER — Encounter: Payer: Self-pay | Admitting: *Deleted

## 2018-04-04 ENCOUNTER — Ambulatory Visit (INDEPENDENT_AMBULATORY_CARE_PROVIDER_SITE_OTHER): Payer: Medicare Other | Admitting: Cardiology

## 2018-04-04 ENCOUNTER — Ambulatory Visit: Payer: Medicare Other | Admitting: Diagnostic Neuroimaging

## 2018-04-04 VITALS — BP 118/60 | HR 79 | Ht 62.0 in | Wt 124.0 lb

## 2018-04-04 DIAGNOSIS — I10 Essential (primary) hypertension: Secondary | ICD-10-CM | POA: Diagnosis not present

## 2018-04-04 DIAGNOSIS — I4819 Other persistent atrial fibrillation: Secondary | ICD-10-CM | POA: Diagnosis not present

## 2018-04-04 DIAGNOSIS — R338 Other retention of urine: Secondary | ICD-10-CM | POA: Diagnosis not present

## 2018-04-04 NOTE — Patient Instructions (Signed)
Medication Instructions:  Continue current medications  If you need a refill on your cardiac medications before your next appointment, please call your pharmacy.  Labwork: None Ordered   Testing/Procedures: None Ordered   Follow-Up: You will need a follow up appointment in 3 months.  Please call our office 2 months in advance to schedule this appointment.  You may see James Hochrein, MD or one of the following Advanced Practice Providers on your designated Care Team:   Rhonda Barrett, PA-C . Kathryn Lawrence, DNP, ANP     At [facility], you and your health needs are our priority.  As part of our continuing mission to provide you with exceptional heart care, we have created designated Provider Care Teams.  These Care Teams include your primary Cardiologist (physician) and Advanced Practice Providers (APPs -  Physician Assistants and Nurse Practitioners) who all work together to provide you with the care you need, when you need it.  Thank you for choosing [facility] at [facility]!!     

## 2018-04-04 NOTE — Patient Outreach (Signed)
[facility]) Care Management  04/04/2018  KAY SHIPPY [birth year] [id number]   CSW was able to make contact with patient's son, Quantisha Marsicano today to ensure that patient is being discharged from [facility] [place] where patient was residing to receive rehabilitative services.  Mr. Melby had just picked up patient from the facility and was transporting her home.  Patient's sister will continue to reside with patient in the home to provide 24 hour care and supervision, as well as assist with activities of daily living.  Home health services will be resumed for patient through [facility], which will include nursing, physical therapy and an aide. Patient's private agency sitter services will also be resumed, effective Wednesday, April 05, 2018.  Mr. Ellerbe will continue to provide transportation for patient to and from all physician appointments and monitor medications.  CSW will perform a case closure on patient, as all goals of treatment have been met from social work standpoint and no additional social work needs have been identified at this time.  CSW will place an order for patient to receive an RNCM with [facility] to assist with community case management services.  CSW will also notify Deloria Lair, Geriatric Nurse Practitioner, also with [facility].  CSW will fax an update to patient's Primary Care Physician, Dr. Vonna Kotyk Dettinger to ensure that they are aware of CSW's involvement with patient's plan of care.  CSW was able to ensure that Mr. Stehr has CSW's contact information and has encouraged Mr. Bowe to contact CSW directly if additional social work needs arise in the near future.   Nat Christen, BSW, MSW, LCSW  Licensed Education officer, environmental Health System  Mailing [place] [address]  [place], [place] [address] Physical Address[address], [place], [place] [address] Toll Free Main # [phone] Fax # [phone] Cell # [phone]  Office # [phone] [email or web address]

## 2018-04-04 NOTE — Progress Notes (Signed)
Virtual Visit via Telephone Note    Evaluation Performed:  Follow-up visit  This visit type was conducted due to national recommendations for restrictions regarding the COVID-19 Pandemic (e.g. social distancing).  This format is felt to be most appropriate for this patient at this time.  All issues noted in this document were discussed and addressed.  No physical exam was performed (except for noted visual exam findings with Video Visits).  Please refer to the patient's chart (MyChart message for video visits and phone note for telephone visits) for the patient's consent to telehealth for [facility].  Date:  04/04/2018   ID:  [name], DOB [birth year], MRN [id number]  Patient Location:  Home address  Provider location:   [place], [place]  PCP:  Dettinger, Fransisca Kaufmann, MD  Cardiologist:  Minus Breeding, MD  Electrophysiologist:  None   Chief Complaint:  Atrial fib  History of Present Illness:    [name] is a 85 y.o. female who presents via audio/video conferencing for a telehealth visit today.    The patient was in the hospital recently with seizure or questionable TIA.  I reviewed these records.  She was sent to a rehab center and she was just sent home today. We saw her in the hospital because she had increased rate atrial fib.  She was sent home on the meds as listed below with several med changes.    She denies any acute symptoms.  The patient denies any new symptoms such as chest discomfort, neck or arm discomfort. There has been no new shortness of breath, PND or orthopnea. There have been no reported palpitations, presyncope or syncope.  She does not notice her atrial fib.    The patient does not have symptoms concerning for COVID-19 infection (fever, chills, cough, or new shortness of breath).    Prior CV studies:   The following studies were reviewed today:  Hospital records  Past Medical History:  Diagnosis Date  . Anemia   . Atrial fibrillation (HCC)     Paroxysmal, Flecainide therapy  . Calf pain    September, 2012, at rest  . Carotid bruit    Doppler, December, 2009, no abnormality  . Cataract   . Diverticulosis   . GERD (gastroesophageal reflux disease)   . History of shingles   . HLD (hyperlipidemia)   . HTN (hypertension)   . Hypothyroidism   . Insomnia   . Osteoarthritis   . PONV (postoperative nausea and vomiting)   . Rectal bleeding 2001   diverticulosis and int hemorrhoids on 07/1999 and 02/2010 colonoscopies.  . Seizures ([place])   . Stroke ([place])   . Warfarin anticoagulation    Past Surgical History:  Procedure Laterality Date  . BIOPSY  09/29/2015   Procedure: BIOPSY;  Surgeon: Rogene Houston, MD;  [facility];  Service: Endoscopy;;  gastric  . CATARACT EXTRACTION W/PHACO Right 04/15/2014   Procedure: CATARACT EXTRACTION PHACO AND INTRAOCULAR LENS PLACEMENT (IOC);  Surgeon: Tonny Branch, MD;  Location: [facility] ORS;  Service: Ophthalmology;  Laterality: Right;  CDE: 13.30  . CATARACT EXTRACTION W/PHACO Left 05/13/2014   Procedure: CATARACT EXTRACTION PHACO AND INTRAOCULAR LENS PLACEMENT LEFT EYE;  Surgeon: Tonny Branch, MD;  Location: [facility] ORS;  Service: Ophthalmology;  Laterality: Left;  CDE:13.00  . CHOLECYSTECTOMY N/A 01/15/2015   Procedure: LAPAROSCOPIC CHOLECYSTECTOMY;  Surgeon: Ralene Ok, MD;  Location: [place];  Service: General;  Laterality: N/A;  . COLONOSCOPY N/A 09/29/2015   Procedure: COLONOSCOPY;  Surgeon: Rogene Houston, MD;  Location: [facility];  Service: Endoscopy;  Laterality: N/A;  . COLONOSCOPY    . ESOPHAGOGASTRODUODENOSCOPY N/A 01/13/2015   Procedure: ESOPHAGOGASTRODUODENOSCOPY (EGD);  Surgeon: Jerene Bears, MD;  Location: [facility];  Service: Endoscopy;  Laterality: N/A;  . ESOPHAGOGASTRODUODENOSCOPY N/A 09/29/2015   Procedure: ESOPHAGOGASTRODUODENOSCOPY (EGD);  Surgeon: Rogene Houston, MD;  Location[facility];  Service: Endoscopy;  Laterality: N/A;  12:15  . GALLBLADDER SURGERY    . LEFT  HEART CATH AND CORONARY ANGIOGRAPHY N/A 06/22/2016   Procedure: Left Heart Cath and Coronary Angiography;  Surgeon: Troy Sine, MD;  Location: [facility] CV LAB;  Service: Cardiovascular;  Laterality: N/A;  . SHOULDER OPEN ROTATOR CUFF REPAIR     bilateral  . TOTAL KNEE ARTHROPLASTY  05/26/10   right  . TOTAL KNEE ARTHROPLASTY  11/17/2010   Procedure: TOTAL KNEE ARTHROPLASTY;  Surgeon: Mauri Pole;  Location: [facility] ORS;  Service: Orthopedics;  Laterality: Left;  . TOTAL KNEE ARTHROPLASTY     Right  . TOTAL SHOULDER REPLACEMENT  01/2010   left     Current Meds  Medication Sig  . Acetaminophen (TYLENOL) 325 MG CAPS Take 650 mg by mouth every 6 (six) hours as needed for mild pain.  Marland Kitchen ARTIFICIAL TEAR OP Apply 2 drops to eye daily as needed (Dryness). To the left eye  . atorvastatin (LIPITOR) 20 MG tablet Take 1 tablet (20 mg total) by mouth at bedtime.  . digoxin (LANOXIN) 0.125 MG tablet Take 1 tablet (0.125 mg total) by mouth daily. (Patient taking differently: Take 0.125 mg by mouth every morning. )  . diltiazem (CARDIZEM CD) 180 MG 24 hr capsule Take 1 capsule (180 mg total) by mouth daily.  Marland Kitchen donepezil (ARICEPT) 5 MG tablet Take 1 tablet (5 mg total) by mouth at bedtime. FOR MEMORY  . edoxaban (SAVAYSA) 30 MG TABS tablet Take 1 tablet (30 mg total) by mouth daily.  . hydrALAZINE (APRESOLINE) 10 MG tablet Take 1 tablet (10 mg total) by mouth 2 (two) times daily.  Marland Kitchen levothyroxine (SYNTHROID, LEVOTHROID) 50 MCG tablet Take 1 tablet daily before breakfast (Patient taking differently: Take 50 mcg by mouth daily before breakfast. )  . metoprolol succinate (TOPROL-XL) 100 MG 24 hr tablet Take 1 tablet (100 mg total) by mouth daily. Take with or immediately following a meal. (Patient taking differently: Take 100 mg by mouth every morning. )  . Multiple Vitamin (MULTIVITAMIN WITH MINERALS) TABS tablet Take 1 tablet by mouth every morning.  . phenytoin (DILANTIN) 100 MG ER capsule Take 1 capsule  (100 mg total) by mouth 3 (three) times daily.  . tamsulosin (FLOMAX) 0.4 MG CAPS capsule Take 1 capsule (0.4 mg total) by mouth daily.     Allergies:   Penicillins   Social History   Tobacco Use  . Smoking status: Never Smoker  . Smokeless tobacco: Never Used  Substance Use Topics  . Alcohol use: No  . Drug use: No     Family Hx: The patient's family history includes Arthritis in her mother; Atrial fibrillation in her brother; COPD in her brother; Colon cancer in her brother; Coronary artery disease in her brother; Diabetes in her brother; Early death in her father; Gallbladder disease in her brother; Heart attack in her daughter and father; Heart disease in her father and mother; Liver cancer in her brother; Rheum arthritis in her mother.  ROS:   Please see the history of present illness.  As stated in the HPI and negative for all other systems.    Labs/Other Tests and Data Reviewed:    Recent Labs: 03/01/2018: ALT 15 03/02/2018: Magnesium 1.9 03/03/2018: TSH 7.189 03/06/2018: Hemoglobin 12.6; Platelets 283 03/07/2018: BUN <5; Creatinine, Ser 0.48; Potassium 3.4; Sodium 133   Recent Lipid Panel Lab Results  Component Value Date/Time   CHOL 144 01/31/2018 05:57 AM   TRIG 52 01/31/2018 05:57 AM   HDL 70 01/31/2018 05:57 AM   CHOLHDL 2.1 01/31/2018 05:57 AM   LDLCALC 64 01/31/2018 05:57 AM   LDLDIRECT 67 02/01/2017 11:20 AM    Wt Readings from Last 3 Encounters:  04/04/18 124 lb (56.2 kg)  03/01/18 126 lb 1.7 oz (57.2 kg)  02/17/18 131 lb (59.4 kg)     Objective:    Vital Signs:  BP 118/60   Pulse 79   Ht 5\' 2"  (1.575 m)   Wt 124 lb (56.2 kg)   BMI 22.68 kg/m     ASSESSMENT & PLAN:    ATRIAL FIB:  She is on new meds as above.  I reviewed the changes.  We will call the pharmacy and review with them to make sure that we are all on the same page with her meds.  Continue current therapy.    COVID-19 Education: The signs and symptoms of COVID-19 were discussed  with the patient and how to seek care for testing (follow up with PCP or arrange E-visit).  The importance of social distancing was discussed today.  Patient Risk:   After full review of this patient's clinical status, I feel that they are at least moderate risk at this time.  Time:   Today, I have spent 25 minutes with the patient with telehealth technology discussing her recent hospital visit and Covid concerns.  We went over her meds for quite awhile. .     Medication Adjustments/Labs and Tests Ordered: Current medicines are reviewed at length with the patient today.  Concerns regarding medicines are outlined above.  Tests Ordered: No orders of the defined types were placed in this encounter.  Medication Changes: No orders of the defined types were placed in this encounter.   Disposition:  Follow up 3 months  Signed, Minus Breeding, MD  04/04/2018 3:44 PM    [facility]

## 2018-04-05 ENCOUNTER — Ambulatory Visit: Payer: Medicare Other | Admitting: Cardiology

## 2018-04-05 ENCOUNTER — Other Ambulatory Visit: Payer: Self-pay

## 2018-04-05 ENCOUNTER — Encounter

## 2018-04-05 ENCOUNTER — Encounter: Payer: Self-pay | Admitting: Cardiology

## 2018-04-05 DIAGNOSIS — M810 Age-related osteoporosis without current pathological fracture: Secondary | ICD-10-CM | POA: Diagnosis not present

## 2018-04-05 DIAGNOSIS — I69320 Aphasia following cerebral infarction: Secondary | ICD-10-CM | POA: Diagnosis not present

## 2018-04-05 DIAGNOSIS — Z8673 Personal history of transient ischemic attack (TIA), and cerebral infarction without residual deficits: Secondary | ICD-10-CM | POA: Diagnosis not present

## 2018-04-05 DIAGNOSIS — I872 Venous insufficiency (chronic) (peripheral): Secondary | ICD-10-CM | POA: Diagnosis not present

## 2018-04-05 DIAGNOSIS — I110 Hypertensive heart disease with heart failure: Secondary | ICD-10-CM | POA: Diagnosis not present

## 2018-04-05 DIAGNOSIS — G894 Chronic pain syndrome: Secondary | ICD-10-CM | POA: Diagnosis not present

## 2018-04-05 DIAGNOSIS — Z9181 History of falling: Secondary | ICD-10-CM | POA: Diagnosis not present

## 2018-04-05 DIAGNOSIS — I480 Paroxysmal atrial fibrillation: Secondary | ICD-10-CM | POA: Diagnosis not present

## 2018-04-05 DIAGNOSIS — I2510 Atherosclerotic heart disease of native coronary artery without angina pectoris: Secondary | ICD-10-CM | POA: Diagnosis not present

## 2018-04-05 DIAGNOSIS — Z950 Presence of cardiac pacemaker: Secondary | ICD-10-CM | POA: Diagnosis not present

## 2018-04-05 DIAGNOSIS — E039 Hypothyroidism, unspecified: Secondary | ICD-10-CM | POA: Diagnosis not present

## 2018-04-05 DIAGNOSIS — I5032 Chronic diastolic (congestive) heart failure: Secondary | ICD-10-CM | POA: Diagnosis not present

## 2018-04-05 NOTE — Patient Outreach (Addendum)
[facility]) Care Management  04/05/2018  [name] July 05, 1932 [id number]  Received referral on 04-04-2018 from Nat Christen, [facility] LCSW stating patient was discharged from [facility] and is returning home to live with sister. [facility] will complete TOC for member.  Member 85 year old female with two ED to hospital admissions in last 6 months. Last admission from 02-09-2018 to 03-07-2018 due to c/o having "a wide-based gait and kept falling into the walls on the left since 0930..." Member with recent Code Stroke and member discharged to SNF from hospital. PMH: Mild Dementia, HLD; OSA; HTN; Seizure; CVA on Coumadin; GERD; AFIB; Diverticulitis with rectal bleed; Shingles; Hypothyroidism; Insomnia; s/p Knee replacement.  Called member's primary number today and HIPPA compliant voice mail message left. Called secondary number and person answering phone identified as [name], [place] for member. [name] stated that now is not a good time to speak to member and requested a return call back tomorrow, late afternoon as member has MD appointment in the morning.   Will return call to member tomorrow afternoon for initial telephone assessment.  [name], RN-BSN  [facility] Care Management  Community Care Management Coordinator  [phone] [email or web address]

## 2018-04-06 ENCOUNTER — Other Ambulatory Visit: Payer: Self-pay

## 2018-04-06 ENCOUNTER — Ambulatory Visit: Payer: Medicare Other | Admitting: Neurology

## 2018-04-06 ENCOUNTER — Ambulatory Visit (INDEPENDENT_AMBULATORY_CARE_PROVIDER_SITE_OTHER): Payer: Medicare Other | Admitting: Family Medicine

## 2018-04-06 ENCOUNTER — Encounter: Payer: Self-pay | Admitting: Family Medicine

## 2018-04-06 VITALS — BP 143/73 | HR 88 | Temp 97.0°F | Ht 62.0 in | Wt 127.0 lb

## 2018-04-06 DIAGNOSIS — R569 Unspecified convulsions: Secondary | ICD-10-CM

## 2018-04-06 DIAGNOSIS — R531 Weakness: Secondary | ICD-10-CM

## 2018-04-06 DIAGNOSIS — Z8673 Personal history of transient ischemic attack (TIA), and cerebral infarction without residual deficits: Secondary | ICD-10-CM

## 2018-04-06 NOTE — Progress Notes (Signed)
BP (!) 143/73   Pulse 88   Temp (!) 97 F (36.1 C) (Oral)   Ht 5\' 2"  (1.575 m)   Wt 127 lb (57.6 kg)   BMI 23.23 kg/m    Subjective:   Patient ID: [name], female    DOB: [birth year], 85 y.o.   MRN: [id number]  HPI: [name] is a 85 y.o. female presenting on 04/06/2018 for rehab follow up (Left side weakness- patient was discharged 3/31 from [place])   HPI Patient is coming in for rehab follow-up/hospital follow-up today.  She was admitted to the hospital on 03/01/2018 and discharged from the hospital to skilled nursing facility on 03/07/2018 and then stayed there until 04/04/2018.  She was in the hospital initially for left-sided weakness and seizure and what looks like according to the notes possible CVA and encephalopathy.  Her daughter is here with her today and says that since leaving the hospital she has not had any new or worsening symptoms of weakness or increasing confusion or seizures.  She has been doing well since leaving the hospital.  They have already started the process to get home health care for her and started the process of going to rehab.  Patient is still weak in her left arm but denies any weakness in her left leg.  They are using her walker every day and she does have shower chairs and other aids to prevent falling in the house.  She is already had home health come out and evaluate and are planning to get her more services including help with showering.  She said they did not change any of her medications from the hospital.  She does have some mental confusion but that is about back to her baseline where she had been.  Relevant past medical, surgical, family and social history reviewed and updated as indicated. Interim medical history since our last visit reviewed. Allergies and medications reviewed and updated.  Review of Systems  Constitutional: Negative for chills and fever.  Eyes: Negative for visual disturbance.  Respiratory: Negative for chest  tightness and shortness of breath.   Cardiovascular: Negative for chest pain and leg swelling.  Musculoskeletal: Negative for back pain and gait problem.  Skin: Negative for rash.  Neurological: Positive for weakness. Negative for light-headedness, numbness and headaches.  Psychiatric/Behavioral: Positive for confusion. Negative for agitation and behavioral problems.  All other systems reviewed and are negative.   Per HPI unless specifically indicated above   Allergies as of 04/06/2018      Reactions   Penicillins Rash, Other (See Comments)   Has patient had a PCN reaction causing immediate rash, facial/tongue/throat swelling, SOB or lightheadedness with hypotension: No Has patient had a PCN reaction causing severe rash involving mucus membranes or skin necrosis: No Has patient had a PCN reaction that required hospitalization No Has patient had a PCN reaction occurring within the last 10 years: No If all of the above answers are "NO", then may proceed with Cephalosporin use.      Medication List       Accurate as of April 06, 2018 11:10 AM. Always use your most recent med list.        ARTIFICIAL TEAR OP Apply 2 drops to eye daily as needed (Dryness). To the left eye   atorvastatin 20 MG tablet Commonly known as:  LIPITOR Take 1 tablet (20 mg total) by mouth at bedtime.   digoxin 0.125 MG tablet Commonly known as:  LANOXIN Take 1  tablet (0.125 mg total) by mouth daily.   diltiazem 180 MG 24 hr capsule Commonly known as:  CARDIZEM CD Take 1 capsule (180 mg total) by mouth daily.   donepezil 5 MG tablet Commonly known as:  ARICEPT Take 1 tablet (5 mg total) by mouth at bedtime. FOR MEMORY   edoxaban 30 MG Tabs tablet Commonly known as:  SAVAYSA Take 1 tablet (30 mg total) by mouth daily.   hydrALAZINE 10 MG tablet Commonly known as:  APRESOLINE Take 1 tablet (10 mg total) by mouth 2 (two) times daily.   levothyroxine 50 MCG tablet Commonly known as:  SYNTHROID,  LEVOTHROID Take 1 tablet daily before breakfast   metoprolol succinate 100 MG 24 hr tablet Commonly known as:  TOPROL-XL Take 1 tablet (100 mg total) by mouth daily. Take with or immediately following a meal.   multivitamin with minerals Tabs tablet Take 1 tablet by mouth every morning.   phenytoin 100 MG ER capsule Commonly known as:  DILANTIN Take 1 capsule (100 mg total) by mouth 3 (three) times daily.   tamsulosin 0.4 MG Caps capsule Commonly known as:  FLOMAX Take 1 capsule (0.4 mg total) by mouth daily.   Tylenol 325 MG Caps Generic drug:  Acetaminophen Take 650 mg by mouth every 6 (six) hours as needed for mild pain.        Objective:   BP (!) 143/73   Pulse 88   Temp (!) 97 F (36.1 C) (Oral)   Ht 5\' 2"  (1.575 m)   Wt 127 lb (57.6 kg)   BMI 23.23 kg/m   Wt Readings from Last 3 Encounters:  04/06/18 127 lb (57.6 kg)  04/04/18 124 lb (56.2 kg)  03/01/18 126 lb 1.7 oz (57.2 kg)    Physical Exam Vitals signs and nursing note reviewed.  Constitutional:      General: She is not in acute distress.    Appearance: She is well-developed. She is not diaphoretic.  Eyes:     Conjunctiva/sclera: Conjunctivae normal.     Pupils: Pupils are equal, round, and reactive to light.  Cardiovascular:     Rate and Rhythm: Normal rate and regular rhythm.     Heart sounds: Normal heart sounds. No murmur.  Pulmonary:     Effort: Pulmonary effort is normal. No respiratory distress.     Breath sounds: Normal breath sounds. No wheezing.  Musculoskeletal: Normal range of motion.        General: No tenderness.     Comments: 3 out of 5 strength in left upper extremity, 4 out of 5 in other 3 extremities  Skin:    General: Skin is warm and dry.     Findings: No rash.  Neurological:     Mental Status: She is alert and oriented to person, place, and time.     Coordination: Coordination normal.  Psychiatric:        Behavior: Behavior normal.       Assessment & Plan:    Problem List Items Addressed This Visit      Nervous and Auditory   Left-sided weakness - Primary     Other   History of CVA (cerebrovascular accident)   Seizure ([place])      Patient seems like she already has all the services that she need and has follow-up with cardiology and neurology, she already did recheck of labs in the rehab facility after leaving the hospital looks like everything was good including her CBC and chemistry panel.  She is overall in very good spirits and trying to stay home and keep away from people as much as she can currently. Follow up plan: Return in about 6 months (around 10/06/2018), or if symptoms worsen or fail to improve.  Counseling provided for all of the vaccine components No orders of the defined types were placed in this encounter.   Caryl Pina, MD [facility] 04/06/2018, 11:10 AM

## 2018-04-06 NOTE — Patient Outreach (Addendum)
[name]) Care Management  04/06/2018   [name] 09/22/1932 [id number]  Subjective: Received referral on 04-04-2018 from Nat Christen, [facility] LCSW stating patient was discharged from [facility] and is returning home to live with sister. [facility] will complete TOC for member.   Objective: Member 85 year old female with two ED to hospital admissions in last 6 months. Last admission from 02-09-2018 to 03-07-2018 due to c/o having "a wide-based gait and kept falling into the walls on the left since 0930..." Member with recent Code Stroke and member discharged to SNF from hospital. PMH: Mild Dementia, HLD; OSA; HTN; Seizure; CVA on Coumadin; GERD; AFIB; Diverticulitis with rectal bleed; Shingles; Hypothyroidism; Insomnia; s/p Knee replacement.  Current Medications:  Current Outpatient Medications  Medication Sig Dispense Refill  . Acetaminophen (TYLENOL) 325 MG CAPS Take 650 mg by mouth every 6 (six) hours as needed for mild pain.    Marland Kitchen ARTIFICIAL TEAR OP Apply 2 drops to eye daily as needed (Dryness). To the left eye    . atorvastatin (LIPITOR) 20 MG tablet Take 1 tablet (20 mg total) by mouth at bedtime. 90 tablet 3  . digoxin (LANOXIN) 0.125 MG tablet Take 1 tablet (0.125 mg total) by mouth daily. (Patient taking differently: Take 0.125 mg by mouth every morning. ) 90 tablet 3  . diltiazem (CARDIZEM CD) 180 MG 24 hr capsule Take 1 capsule (180 mg total) by mouth daily. 30 capsule 0  . donepezil (ARICEPT) 5 MG tablet Take 1 tablet (5 mg total) by mouth at bedtime. FOR MEMORY 30 tablet 5  . edoxaban (SAVAYSA) 30 MG TABS tablet Take 1 tablet (30 mg total) by mouth daily. 60 tablet 0  . hydrALAZINE (APRESOLINE) 10 MG tablet Take 1 tablet (10 mg total) by mouth 2 (two) times daily. 30 tablet 0  . levothyroxine (SYNTHROID, LEVOTHROID) 50 MCG tablet Take 1 tablet daily before breakfast (Patient taking differently: Take 50 mcg by mouth daily before breakfast. ) 90 tablet 3  .  metoprolol succinate (TOPROL-XL) 100 MG 24 hr tablet Take 1 tablet (100 mg total) by mouth daily. Take with or immediately following a meal. (Patient taking differently: Take 100 mg by mouth every morning. ) 90 tablet 3  . Multiple Vitamin (MULTIVITAMIN WITH MINERALS) TABS tablet Take 1 tablet by mouth every morning.    . phenytoin (DILANTIN) 100 MG ER capsule Take 1 capsule (100 mg total) by mouth 3 (three) times daily. 90 capsule 0  . tamsulosin (FLOMAX) 0.4 MG CAPS capsule Take 1 capsule (0.4 mg total) by mouth daily. 30 capsule 0   No current facility-administered medications for this visit.     Functional Status:  In your present state of health, do you have any difficulty performing the following activities: 03/14/2018 03/01/2018  Hearing? N N  Vision? N N  Difficulty concentrating or making decisions? Y Y  Comment Patient has diagnosis of Dementia. -  Walking or climbing stairs? Y Y  Dressing or bathing? Y Y  Comment Left-sided weakness and chronic pain Syndrome. -  Doing errands, shopping? Y Y  Comment Patient gets easily confused due to Dementia. -  Preparing Food and eating ? N -  Using the Toilet? N -  In the past six months, have you accidently leaked urine? N -  Comment - -  Do you have problems with loss of bowel control? N -  Managing your Medications? Y -  Comment Sister and son manage due to Dementia. -  Managing your Finances? Y -  Comment Sister and son manage due to Dementia. -  Housekeeping or managing your Housekeeping? Y -  Comment Sister and son manage due to Dementia. -  Some recent data might be hidden    Fall/Depression Screening: Fall Risk  03/14/2018 02/24/2018 02/17/2018  Falls in the past year? 1 (No Data) 0  Comment - Late enty for 06/24/17 - YES (CCS) -  Number falls in past yr: 1 (No Data) -  Comment - Late entry for 06/24/17: 2 or more. CCS -  Injury with Fall? 1 1 -  Comment - Late enty for 06/24/17 YES. CCS -  Risk Factor Category  - (No Data) -   Comment - Late entry for 06/24/17 - HIGH RISK. CCS -  Risk for fall due to : History of fall(s);Impaired balance/gait;Impaired mobility;Mental status change - -  Follow up Education provided;Falls prevention discussed (No Data) -  Comment - Falls evaluation completed 06/24/17 late entry. CCS -   PHQ 2/9 Scores 03/14/2018 02/17/2018 01/19/2018 01/06/2018 12/06/2017 11/23/2017 11/09/2017  PHQ - 2 Score 0 0 0 0 0 0 0  PHQ- 9 Score - - - - - - -   [facility] CM Care Plan Problem One     Most Recent Value  Care Plan Problem One  Risk for hospital readmission as evidenced by recent admission  Role Documenting the Problem One  Care Management [place] for Problem One  Active  [facility] Long Term Goal   Member will not be readmitted for next 31 days  [facility] Long Term Goal Start Date  04/06/18  Interventions for Problem One Long Term Goal  Initiated Initial telephone assessment with member, caregiver, and son in order to gather data for member's care  [facility] CM Short Term Goal #1   Risk for injury related to history of and recent hospitalization for seizures  [facility] CM Short Term Goal #1 Start Date  04/06/18  Interventions for Short Term Goal #1  Assess whether member has seizure medications as ordered  [facility] CM Short Term Goal #2   High risk for falls related to PMH: Dementia as evidenced by member stating "I don't know" to questions about health history  [facility] CM Short Term Goal #2 Start Date  04/06/18  Interventions for Short Term Goal #2  Assessed for care provided in home for member,  Assessed whether all member's needs are met.     Assessment: Called member's primary number today and HIPPA identity verified. Spoke to member, [employer] Aide [place], and member's son Ray returned call. Introduced self. Ray stated he is [name]. Ray gave verbal consent to speak to [employer] and states that [place] assists with providing car for member. [facility] benefits explained and Ray verbalized  agreement with services and stated "as long as it is covered by her insurance, it is ok". Ray provided some answers to assessment; however, requested to follow up with [facility] concerning medications and assistance with care. When spoke to member, member was able to state her name, address, and birthday. Member was able to answer simple questions but responded "I don't know" to complex questions and requested for RN CM to speak to [place] instead.  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 Member ambulates with a walker. Ray stated that member has a scale and weights can be done if required.   Home  Care: [name] stated that [facility] Nurse came to visit and PT will come for 6 weeks to assist member.   Appointments: Member visited PCP today and family denies concerns.  Seizures: Family denies s/s of recent seizure activity.   Transportation: Ray stated that he and [name] provides transportation for member.   Will follow up with member next week.   [name], RN-BSN  [facility] Care Management  Community Care Management Coordinator  [phone] [email or web address]

## 2018-04-07 ENCOUNTER — Telehealth: Payer: Self-pay | Admitting: *Deleted

## 2018-04-07 DIAGNOSIS — Z9181 History of falling: Secondary | ICD-10-CM | POA: Diagnosis not present

## 2018-04-07 DIAGNOSIS — G894 Chronic pain syndrome: Secondary | ICD-10-CM | POA: Diagnosis not present

## 2018-04-07 DIAGNOSIS — Z8673 Personal history of transient ischemic attack (TIA), and cerebral infarction without residual deficits: Secondary | ICD-10-CM | POA: Diagnosis not present

## 2018-04-07 DIAGNOSIS — I110 Hypertensive heart disease with heart failure: Secondary | ICD-10-CM | POA: Diagnosis not present

## 2018-04-07 DIAGNOSIS — I872 Venous insufficiency (chronic) (peripheral): Secondary | ICD-10-CM | POA: Diagnosis not present

## 2018-04-07 DIAGNOSIS — I480 Paroxysmal atrial fibrillation: Secondary | ICD-10-CM | POA: Diagnosis not present

## 2018-04-07 DIAGNOSIS — I5032 Chronic diastolic (congestive) heart failure: Secondary | ICD-10-CM | POA: Diagnosis not present

## 2018-04-07 DIAGNOSIS — I2510 Atherosclerotic heart disease of native coronary artery without angina pectoris: Secondary | ICD-10-CM | POA: Diagnosis not present

## 2018-04-07 DIAGNOSIS — M810 Age-related osteoporosis without current pathological fracture: Secondary | ICD-10-CM | POA: Diagnosis not present

## 2018-04-07 DIAGNOSIS — I69320 Aphasia following cerebral infarction: Secondary | ICD-10-CM | POA: Diagnosis not present

## 2018-04-07 DIAGNOSIS — E039 Hypothyroidism, unspecified: Secondary | ICD-10-CM | POA: Diagnosis not present

## 2018-04-07 DIAGNOSIS — Z950 Presence of cardiac pacemaker: Secondary | ICD-10-CM | POA: Diagnosis not present

## 2018-04-07 NOTE — Telephone Encounter (Signed)
Pharmacy needs refills today for cardizem,tamsulosin, dilantin, hydroxazine, edoxaban

## 2018-04-08 ENCOUNTER — Telehealth: Payer: Self-pay | Admitting: Cardiology

## 2018-04-08 ENCOUNTER — Other Ambulatory Visit: Payer: Self-pay | Admitting: Cardiology

## 2018-04-08 ENCOUNTER — Other Ambulatory Visit: Payer: Self-pay | Admitting: Family

## 2018-04-08 MED ORDER — EDOXABAN TOSYLATE 30 MG PO TABS: 30.0000 mg | ORAL_TABLET | ORAL | 6 refills | Status: DC

## 2018-04-08 MED ORDER — PHENYTOIN SODIUM EXTENDED 100 MG PO CAPS: 100.0000 mg | ORAL_CAPSULE | Freq: Three times a day (TID) | ORAL | 1 refills | Status: DC

## 2018-04-08 NOTE — Progress Notes (Signed)
Son called multiple times for medication refill. Pt's son did not know what medication. I called the pharmacy and refilled her Dilantin 100 mg.

## 2018-04-08 NOTE — Telephone Encounter (Signed)
Pt was out of her cardiology meds and dilantin I refilled diltiazem CD 180 mg for 90 days one daily, hydralazine 10 mg BID, 90 days, edoxaban and flomax,  All for 90 days, I asked for her PCP to order the dilantin.

## 2018-04-08 NOTE — Telephone Encounter (Signed)
[name] was out of several of her meds, I refilled --dilt, hydralazine, and flomax.  On her edoxaban her insurance did not agree to fill.  I called and did prior authorization.  I have not heard back.  Her usual pharmacy did not have and she was completely out.  Her dilantin interacts with both eliquis and xarelto.  Her son was to pick up edoxaban from [facility] on [address] in  [place].    Her number for prior authorization is [id number]    I will send to [facility] in our office to have her check on it.

## 2018-04-10 ENCOUNTER — Telehealth: Payer: Self-pay | Admitting: Cardiology

## 2018-04-10 ENCOUNTER — Other Ambulatory Visit: Payer: Self-pay

## 2018-04-10 DIAGNOSIS — I2510 Atherosclerotic heart disease of native coronary artery without angina pectoris: Secondary | ICD-10-CM | POA: Diagnosis not present

## 2018-04-10 DIAGNOSIS — I69320 Aphasia following cerebral infarction: Secondary | ICD-10-CM | POA: Diagnosis not present

## 2018-04-10 DIAGNOSIS — Z950 Presence of cardiac pacemaker: Secondary | ICD-10-CM | POA: Diagnosis not present

## 2018-04-10 DIAGNOSIS — Z8673 Personal history of transient ischemic attack (TIA), and cerebral infarction without residual deficits: Secondary | ICD-10-CM | POA: Diagnosis not present

## 2018-04-10 DIAGNOSIS — I110 Hypertensive heart disease with heart failure: Secondary | ICD-10-CM | POA: Diagnosis not present

## 2018-04-10 DIAGNOSIS — G894 Chronic pain syndrome: Secondary | ICD-10-CM | POA: Diagnosis not present

## 2018-04-10 DIAGNOSIS — I872 Venous insufficiency (chronic) (peripheral): Secondary | ICD-10-CM | POA: Diagnosis not present

## 2018-04-10 DIAGNOSIS — M810 Age-related osteoporosis without current pathological fracture: Secondary | ICD-10-CM | POA: Diagnosis not present

## 2018-04-10 DIAGNOSIS — Z9181 History of falling: Secondary | ICD-10-CM | POA: Diagnosis not present

## 2018-04-10 DIAGNOSIS — E039 Hypothyroidism, unspecified: Secondary | ICD-10-CM | POA: Diagnosis not present

## 2018-04-10 DIAGNOSIS — I5032 Chronic diastolic (congestive) heart failure: Secondary | ICD-10-CM | POA: Diagnosis not present

## 2018-04-10 DIAGNOSIS — I480 Paroxysmal atrial fibrillation: Secondary | ICD-10-CM | POA: Diagnosis not present

## 2018-04-10 MED ORDER — TAMSULOSIN HCL 0.4 MG PO CAPS: 0.4000 mg | ORAL_CAPSULE | Freq: Every day | ORAL | 0 refills | Status: DC

## 2018-04-10 MED ORDER — HYDRALAZINE HCL 10 MG PO TABS: 10.0000 mg | ORAL_TABLET | Freq: Two times a day (BID) | ORAL | 0 refills | Status: DC

## 2018-04-10 MED ORDER — PHENYTOIN SODIUM EXTENDED 100 MG PO CAPS: 100.0000 mg | ORAL_CAPSULE | Freq: Three times a day (TID) | ORAL | 1 refills | Status: DC

## 2018-04-10 MED ORDER — DILTIAZEM HCL ER COATED BEADS 180 MG PO CP24: 180.0000 mg | ORAL_CAPSULE | Freq: Every day | ORAL | 0 refills | Status: DC

## 2018-04-10 NOTE — Telephone Encounter (Signed)
I sent both for the patient

## 2018-04-10 NOTE — Telephone Encounter (Signed)
Pt c/o medication issue:  1. Name of Medication: SAVAYSA / gen- EDOXABAN  2. How are you currently taking this medication (dosage and times per day)? 1  day  3. Are you having a reaction (difficulty breathing--STAT)? No   COST $470  4. What is your medication issue? Cost.    Pt's son calling following up on getting something cheaper.  He spoke to the un call doctor Friday to approval by insurance company to pay for it or a substitute for this medication?

## 2018-04-10 NOTE — Progress Notes (Signed)
Please review and send if approved. Hydralazine was printed.

## 2018-04-10 NOTE — Telephone Encounter (Signed)
See below. Looks that PA was completed, but have not heard back from insurance.

## 2018-04-10 NOTE — Telephone Encounter (Signed)
I called in Cardizem, hydralazine, tamsulosin but for some reason it is telling me that the the [place] and edoxaban are locked up in some future encounter and it will not let me do anything with them, do we know how to adjust this or fix that Caryl Pina, MD [facility] 04/10/2018, 8:16 AM

## 2018-04-10 NOTE — Telephone Encounter (Signed)
Left message - rx's sent to pharmacy.

## 2018-04-10 NOTE — Telephone Encounter (Signed)
According to patient's chart Edoxaban was d/c 4/4 by cardiology. Dilantin was re ordered in and order only encounter and sent to you to approve,

## 2018-04-10 NOTE — Telephone Encounter (Signed)
This is Dr Cherlyn Cushing pt. Will forward to our [place] office.

## 2018-04-10 NOTE — Telephone Encounter (Signed)
Pradaxa, Xarelto and Eliquis are contraindicated with Dilantin (phenytoin).  Patient on Edoxaban due to interaction.    ALL prior-authorizations follow up done by RN unless [id number] or [employer]. (please call insurance for update)  Will forward message to DR Hochrein's nurse for follow up. Please provide samples if available.  If unable to obtain Edoxaban, patient will need transition back to warfarin.

## 2018-04-11 DIAGNOSIS — M810 Age-related osteoporosis without current pathological fracture: Secondary | ICD-10-CM | POA: Diagnosis not present

## 2018-04-11 DIAGNOSIS — Z8673 Personal history of transient ischemic attack (TIA), and cerebral infarction without residual deficits: Secondary | ICD-10-CM | POA: Diagnosis not present

## 2018-04-11 DIAGNOSIS — I2510 Atherosclerotic heart disease of native coronary artery without angina pectoris: Secondary | ICD-10-CM | POA: Diagnosis not present

## 2018-04-11 DIAGNOSIS — Z9181 History of falling: Secondary | ICD-10-CM | POA: Diagnosis not present

## 2018-04-11 DIAGNOSIS — I480 Paroxysmal atrial fibrillation: Secondary | ICD-10-CM | POA: Diagnosis not present

## 2018-04-11 DIAGNOSIS — E039 Hypothyroidism, unspecified: Secondary | ICD-10-CM | POA: Diagnosis not present

## 2018-04-11 DIAGNOSIS — Z950 Presence of cardiac pacemaker: Secondary | ICD-10-CM | POA: Diagnosis not present

## 2018-04-11 DIAGNOSIS — I5032 Chronic diastolic (congestive) heart failure: Secondary | ICD-10-CM | POA: Diagnosis not present

## 2018-04-11 DIAGNOSIS — I110 Hypertensive heart disease with heart failure: Secondary | ICD-10-CM | POA: Diagnosis not present

## 2018-04-11 DIAGNOSIS — G894 Chronic pain syndrome: Secondary | ICD-10-CM | POA: Diagnosis not present

## 2018-04-11 DIAGNOSIS — I872 Venous insufficiency (chronic) (peripheral): Secondary | ICD-10-CM | POA: Diagnosis not present

## 2018-04-11 DIAGNOSIS — I69320 Aphasia following cerebral infarction: Secondary | ICD-10-CM | POA: Diagnosis not present

## 2018-04-12 ENCOUNTER — Telehealth: Payer: Self-pay | Admitting: Cardiology

## 2018-04-12 ENCOUNTER — Telehealth: Payer: Self-pay

## 2018-04-12 DIAGNOSIS — Z9181 History of falling: Secondary | ICD-10-CM | POA: Diagnosis not present

## 2018-04-12 DIAGNOSIS — I2510 Atherosclerotic heart disease of native coronary artery without angina pectoris: Secondary | ICD-10-CM | POA: Diagnosis not present

## 2018-04-12 DIAGNOSIS — I110 Hypertensive heart disease with heart failure: Secondary | ICD-10-CM | POA: Diagnosis not present

## 2018-04-12 DIAGNOSIS — G894 Chronic pain syndrome: Secondary | ICD-10-CM | POA: Diagnosis not present

## 2018-04-12 DIAGNOSIS — Z8673 Personal history of transient ischemic attack (TIA), and cerebral infarction without residual deficits: Secondary | ICD-10-CM | POA: Diagnosis not present

## 2018-04-12 DIAGNOSIS — M810 Age-related osteoporosis without current pathological fracture: Secondary | ICD-10-CM | POA: Diagnosis not present

## 2018-04-12 DIAGNOSIS — I872 Venous insufficiency (chronic) (peripheral): Secondary | ICD-10-CM | POA: Diagnosis not present

## 2018-04-12 DIAGNOSIS — E039 Hypothyroidism, unspecified: Secondary | ICD-10-CM | POA: Diagnosis not present

## 2018-04-12 DIAGNOSIS — Z950 Presence of cardiac pacemaker: Secondary | ICD-10-CM | POA: Diagnosis not present

## 2018-04-12 DIAGNOSIS — I480 Paroxysmal atrial fibrillation: Secondary | ICD-10-CM | POA: Diagnosis not present

## 2018-04-12 DIAGNOSIS — I5032 Chronic diastolic (congestive) heart failure: Secondary | ICD-10-CM | POA: Diagnosis not present

## 2018-04-12 DIAGNOSIS — I69320 Aphasia following cerebral infarction: Secondary | ICD-10-CM | POA: Diagnosis not present

## 2018-04-12 NOTE — Telephone Encounter (Signed)
New Message          Patient's son is calling to check status on the approval on medication for his mother "108" (Edoxaban-generic brand) Patient's son is needing approval ASAP!!!!!Patient will run out on Friday. Pls call to advise.Marland KitchenMarland Kitchen

## 2018-04-12 NOTE — Telephone Encounter (Signed)
Unable to transition to Eliquis due to drug-drug interaction with phenytoin. See phone note from 04/10/18 for additional details.

## 2018-04-13 DIAGNOSIS — E039 Hypothyroidism, unspecified: Secondary | ICD-10-CM | POA: Diagnosis not present

## 2018-04-13 DIAGNOSIS — G894 Chronic pain syndrome: Secondary | ICD-10-CM | POA: Diagnosis not present

## 2018-04-13 DIAGNOSIS — M810 Age-related osteoporosis without current pathological fracture: Secondary | ICD-10-CM | POA: Diagnosis not present

## 2018-04-13 DIAGNOSIS — I69320 Aphasia following cerebral infarction: Secondary | ICD-10-CM | POA: Diagnosis not present

## 2018-04-13 DIAGNOSIS — I872 Venous insufficiency (chronic) (peripheral): Secondary | ICD-10-CM | POA: Diagnosis not present

## 2018-04-13 DIAGNOSIS — I110 Hypertensive heart disease with heart failure: Secondary | ICD-10-CM | POA: Diagnosis not present

## 2018-04-13 DIAGNOSIS — Z9181 History of falling: Secondary | ICD-10-CM | POA: Diagnosis not present

## 2018-04-13 DIAGNOSIS — I480 Paroxysmal atrial fibrillation: Secondary | ICD-10-CM | POA: Diagnosis not present

## 2018-04-13 DIAGNOSIS — I2510 Atherosclerotic heart disease of native coronary artery without angina pectoris: Secondary | ICD-10-CM | POA: Diagnosis not present

## 2018-04-13 DIAGNOSIS — I5032 Chronic diastolic (congestive) heart failure: Secondary | ICD-10-CM | POA: Diagnosis not present

## 2018-04-13 DIAGNOSIS — Z8673 Personal history of transient ischemic attack (TIA), and cerebral infarction without residual deficits: Secondary | ICD-10-CM | POA: Diagnosis not present

## 2018-04-13 DIAGNOSIS — Z950 Presence of cardiac pacemaker: Secondary | ICD-10-CM | POA: Diagnosis not present

## 2018-04-13 NOTE — Telephone Encounter (Signed)
° ° °  Sharyn Lull from [facility] calling for status of prior auth    Pt c/o medication issue:  1. Name of Medication: edoxaban (SAVAYSA) 30 MG TABS tablet  2. How are you currently taking this medication (dosage and times per day)? n/a  3. Are you having a reaction (difficulty breathing--STAT)? no  4. What is your medication issue? Prior auth needed

## 2018-04-14 ENCOUNTER — Ambulatory Visit: Payer: Medicare Other

## 2018-04-14 DIAGNOSIS — I480 Paroxysmal atrial fibrillation: Secondary | ICD-10-CM | POA: Diagnosis not present

## 2018-04-14 DIAGNOSIS — Z9181 History of falling: Secondary | ICD-10-CM | POA: Diagnosis not present

## 2018-04-14 DIAGNOSIS — I2510 Atherosclerotic heart disease of native coronary artery without angina pectoris: Secondary | ICD-10-CM | POA: Diagnosis not present

## 2018-04-14 DIAGNOSIS — G894 Chronic pain syndrome: Secondary | ICD-10-CM | POA: Diagnosis not present

## 2018-04-14 DIAGNOSIS — I5032 Chronic diastolic (congestive) heart failure: Secondary | ICD-10-CM | POA: Diagnosis not present

## 2018-04-14 DIAGNOSIS — I872 Venous insufficiency (chronic) (peripheral): Secondary | ICD-10-CM | POA: Diagnosis not present

## 2018-04-14 DIAGNOSIS — I69320 Aphasia following cerebral infarction: Secondary | ICD-10-CM | POA: Diagnosis not present

## 2018-04-14 DIAGNOSIS — Z8673 Personal history of transient ischemic attack (TIA), and cerebral infarction without residual deficits: Secondary | ICD-10-CM | POA: Diagnosis not present

## 2018-04-14 DIAGNOSIS — E039 Hypothyroidism, unspecified: Secondary | ICD-10-CM | POA: Diagnosis not present

## 2018-04-14 DIAGNOSIS — M810 Age-related osteoporosis without current pathological fracture: Secondary | ICD-10-CM | POA: Diagnosis not present

## 2018-04-14 DIAGNOSIS — I110 Hypertensive heart disease with heart failure: Secondary | ICD-10-CM | POA: Diagnosis not present

## 2018-04-14 DIAGNOSIS — Z950 Presence of cardiac pacemaker: Secondary | ICD-10-CM | POA: Diagnosis not present

## 2018-04-17 ENCOUNTER — Other Ambulatory Visit: Payer: Self-pay

## 2018-04-17 ENCOUNTER — Telehealth: Payer: Self-pay | Admitting: Pharmacist

## 2018-04-17 DIAGNOSIS — Z9181 History of falling: Secondary | ICD-10-CM | POA: Diagnosis not present

## 2018-04-17 DIAGNOSIS — I5032 Chronic diastolic (congestive) heart failure: Secondary | ICD-10-CM | POA: Diagnosis not present

## 2018-04-17 DIAGNOSIS — M810 Age-related osteoporosis without current pathological fracture: Secondary | ICD-10-CM | POA: Diagnosis not present

## 2018-04-17 DIAGNOSIS — G894 Chronic pain syndrome: Secondary | ICD-10-CM | POA: Diagnosis not present

## 2018-04-17 DIAGNOSIS — I2510 Atherosclerotic heart disease of native coronary artery without angina pectoris: Secondary | ICD-10-CM | POA: Diagnosis not present

## 2018-04-17 DIAGNOSIS — I110 Hypertensive heart disease with heart failure: Secondary | ICD-10-CM | POA: Diagnosis not present

## 2018-04-17 DIAGNOSIS — Z8673 Personal history of transient ischemic attack (TIA), and cerebral infarction without residual deficits: Secondary | ICD-10-CM | POA: Diagnosis not present

## 2018-04-17 DIAGNOSIS — I69320 Aphasia following cerebral infarction: Secondary | ICD-10-CM | POA: Diagnosis not present

## 2018-04-17 DIAGNOSIS — I480 Paroxysmal atrial fibrillation: Secondary | ICD-10-CM | POA: Diagnosis not present

## 2018-04-17 DIAGNOSIS — Z950 Presence of cardiac pacemaker: Secondary | ICD-10-CM | POA: Diagnosis not present

## 2018-04-17 DIAGNOSIS — I872 Venous insufficiency (chronic) (peripheral): Secondary | ICD-10-CM | POA: Diagnosis not present

## 2018-04-17 DIAGNOSIS — E039 Hypothyroidism, unspecified: Secondary | ICD-10-CM | POA: Diagnosis not present

## 2018-04-17 NOTE — Patient Outreach (Signed)
[name]) Care Management  04/17/2018  [name] 04/27/1932 [id number]   Member with recent Code Stroke and member discharged to SNF from [facility]. PMH: Mild Dementia, HLD; OSA; HTN; Seizure; CVA on Coumadin; GERD; AFIB; Diverticulitis with rectal bleed; Shingles; Hypothyroidism; Insomnia; s/p Knee replacement.   Called member at 29 and spoke to [name]. [name] stated that he is currently at work; however, was able to provide some information for assessment update. [name] requesting for RN CM to wait until later today to call member as [name], Building control surveyor will have arrived and requested for RN CM to speak to [facility] as well.   [name] voiced concern stating that member's Savaysa medication costs $500 and he is unable to continue to pay for this medication. Pharmacy referral sent.   Called member at 1629 and spoke to member and HIPPA identifiers verified. and to [place] [facility].   Member was able to answer simple questions on initial assessment. Member unable to answer complex questions. Member stated that her sister [name] lives with her currently and previously [name] confirmed that "she lives with my aunt [name] assisted with calling out names of member's medication and confirmed member's appointments.   Member and [name] denies any recent falls. Member denies pain. Member stated she is able to bathe, dress, and ambulate at times without her walker. Member encouraged to use ambulatory aides as ordered.   [name] confirmed member's next appointment on 05-30-2018 for seizure appointment and [name] to provide transportation. Family denies meal assistance for member.  Member unable to state signs and symptoms of stroke. [name] educated to call 911 if signs and symptoms of stroke instead of driving member.  Attempted to educate member concerning COVID-19 and member responded "what is that"; however, member did state that she is washing her hands and she does not  leave her home. Educated [name] and member that COVID-19 is a virus. Member aware that RN CM will ask again next week.  [facility] CM Care Plan Problem One     Most Recent Value  Care Plan Problem One  Risk for [facility] readmission as evidenced by recent admission  Role Documenting the Problem One  Care Management [name] for Problem One  Active  [place] Term Goal   Member will not be readmitted for next 31 days  [facility]N Long Term Goal Start Date  04/06/18  Interventions for Problem One Long Term Goal  Assessed member's knowledge of signs/symptoms of stroke,  Caregiver educated to call 911 if sign/ symptoms of stroke  [facility] CM Short Term Goal #1   Risk for injury related to history of and recent hospitalization for seizures  [facility] CM Short Term Goal #1 Start Date  04/06/18  Interventions for Short Term Goal #1  Assessed for recent seizure activity  [facility] CM Short Term Goal #2   High risk for falls related to PMH: Dementia as evidenced by member stating "I don't know" to questions about health history  [facility] CM Short Term Goal #2 Start Date  04/06/18  Interventions for Short Term Goal #2  Educated member about importance of using walker when needed to prevent falls,  Provided listening while member reported her exercise routine with Home PT/OT    [facility] CM Care Plan Problem Two     Most Recent Value  Care Plan Problem Two  "Knowledge Deficits related to COVID-19 and impact on patient self-Health management  Role Documenting the Problem Two  Care Management  [facility]  Care Plan for Problem Two  Active  Interventions for Problem Two Long Term Goal   Assessed member's knowledge of COVID-19,    THN Long Term Goal  Over next 31 days member will verbalize measures to prevent exposure to COVID-19  [facility] Long Term Goal Start Date  04/17/18  TH[facility] CM Short Term Goal #1   "Over the next 30 days, patient will verbalize basic understanding of COVID-19 impact on individual health and self-health management as  evidenced by verbalization of basic understanding of COVID-19 as a viral disease  [facility] CM Short Term Goal #1 Start Date  04/17/18  Interventions for Short Term Goal #2   Encouraged member to continue good handwashing and social distancing,  Educated member, family, caregiver of signs and symtioms of COVID 65 and when to call 40      Sent 44 Barrier Letter, Care Plan, Depression screening, Fall Risk, Medications to PCP. Sent Production assistant, radio and packet; [name] Calendar to [name] home.   Pharmacy Referral Sent  Will follow up with family next week to complete assessment.  Terri Mola "[name]" [name], RN-BSN  [name] Care Management  [name]  [phone] [name]

## 2018-04-17 NOTE — Patient Outreach (Signed)
[facility]) Care Management  [place]   04/17/2018  MEI SUITS 09/28/1932 [id number]  Reason for referral: medication assistance  Referral source: [facility] Telephonic Nurse Referral medication(s): [id number] Current insurance: [employer]   HPI:  Patient is an 85 year old female with multiple medical conditions including but not limited to:  Seizure disorder, osteoporosis, hypothyroidism, hypertension, overactive bladder,  Afib, CHF, and history of CVA.  Patient was recently hospitalized for left sided weakness and active seizure. She went to a SNF after her hospitalization.    Objective: Allergies  Allergen Reactions  . Penicillins Rash and Other (See Comments)    Has patient had a PCN reaction causing immediate rash, facial/tongue/throat swelling, SOB or lightheadedness with hypotension: No Has patient had a PCN reaction causing severe rash involving mucus membranes or skin necrosis: No Has patient had a PCN reaction that required hospitalization No Has patient had a PCN reaction occurring within the last 10 years: No If all of the above answers are "NO", then may proceed with Cephalosporin use.    Medications Reviewed Today    Reviewed by Elayne Guerin, [facility] (Pharmacist) on 04/17/18 at 1657  Med List Status: <None>  Medication Order Taking? Sig Documenting Provider Last Dose Status Informant  Acetaminophen (TYLENOL) 325 MG CAPS [id number] Yes Take 650 mg by mouth every 6 (six) hours as needed for mild pain. [provider] Taking Active Pharmacy Records  ARTIFICIAL TEAR OP [id number] Yes Apply 2 drops to eye daily as needed (Dryness). To the left eye [provider] Taking Active Pharmacy Records  atorvastatin (LIPITOR) 20 MG tablet [id number] Yes Take 1 tablet (20 mg total) by mouth at bedtime. Dettinger, Fransisca Kaufmann, MD Taking Active Pharmacy Records  digoxin (LANOXIN) 0.125 MG tablet [id number] Yes Take 1 tablet (0.125 mg total) by  mouth daily.  Patient taking differently:  Take 0.125 mg by mouth every morning.    Minus Breeding, MD Taking Active Pharmacy Records  diltiazem [facility]) 180 MG 24 hr capsule [id number]  Take 1 capsule (180 mg total) by mouth daily. Dettinger, Fransisca Kaufmann, MD  Active   donepezil (ARICEPT) 5 MG tablet [id number] Yes Take 1 tablet (5 mg total) by mouth at bedtime. FOR MEMORY Dettinger, Fransisca Kaufmann, MD Taking Active Pharmacy Records  edoxaban [facility]) 30 MG TABS tablet [id number] Yes Take 1 tablet (30 mg total) by mouth daily. Isaiah Serge, NP Taking Active   hydrALAZINE (APRESOLINE) 10 MG tablet [id number] Yes Take 1 tablet (10 mg total) by mouth 2 (two) times daily. Dettinger, Fransisca Kaufmann, MD Taking Active   levothyroxine (SYNTHROID, LEVOTHROID) 50 MCG tablet [id number] Yes Take 1 tablet daily before breakfast  Patient taking differently:  Take 50 mcg by mouth daily before breakfast.    Dettinger, Fransisca Kaufmann, MD Taking Active Pharmacy Records  metoprolol succinate (TOPROL-XL) 100 MG 24 hr tablet [id number] Yes Take 1 tablet (100 mg total) by mouth daily. Take with or immediately following a meal.  Patient taking differently:  Take 100 mg by mouth every morning.    Dettinger, Fransisca Kaufmann, MD Taking Active Pharmacy Records  Multiple Vitamin (MULTIVITAMIN WITH MINERALS) TABS tablet [id number] Yes Take 1 tablet by mouth every morning. [provider] Taking Active Pharmacy Records  phenytoin (DILANTIN) 100 MG ER capsule [id number] Yes Take 1 capsule (100 mg total) by mouth 3 (three) times daily. Dettinger, Fransisca Kaufmann, MD Taking Active   tamsulosin [facility]) 0.4 MG CAPS capsule [id number] Yes Take 1 capsule (  0.4 mg total) by mouth daily. Dettinger, Fransisca Kaufmann, MD Taking Active           Assessment:  Drugs sorted by system:  Neurologic/Psychologic: Phenytoin, Donepezil  Cardiovascular: Atorvastatin, Digoxin, Diltiazem, Hydralazine, Metoprolol XL, Savaysa    Endocrine: Levothyroxine  Genitourinary: Tamsulosin  Vitamins/Minerals/Supplements: Multiple Vitamin,   Pain: Acetaminophen  Misc: Artificial tears  Medication Assistance Findings:  Medication assistance needs identified.   Patient expressed concern over paying for Savaysa.  It is non formulary on her insurance. [place] was called. A prior authorization form was sent to Dr. Merita Norton office this morning.  Patient was discharged on Savaysa. The attending physician documented they were not able to use Eliquis due to a drug interaction with Phenytoin.  Upon initial research, there is not a patient assistance program for Savaysa.     Additional medication assistance options reviewed with patient as warranted:  No other options identified  Plan: I will call the patient's PCP office tomorrow to inquire about the PA form .

## 2018-04-18 ENCOUNTER — Telehealth: Payer: Self-pay

## 2018-04-18 ENCOUNTER — Ambulatory Visit: Payer: Self-pay | Admitting: Pharmacist

## 2018-04-18 ENCOUNTER — Other Ambulatory Visit: Payer: Self-pay | Admitting: Pharmacist

## 2018-04-18 ENCOUNTER — Telehealth: Payer: Self-pay | Admitting: Cardiology

## 2018-04-18 DIAGNOSIS — I872 Venous insufficiency (chronic) (peripheral): Secondary | ICD-10-CM | POA: Diagnosis not present

## 2018-04-18 DIAGNOSIS — E039 Hypothyroidism, unspecified: Secondary | ICD-10-CM | POA: Diagnosis not present

## 2018-04-18 DIAGNOSIS — I5032 Chronic diastolic (congestive) heart failure: Secondary | ICD-10-CM | POA: Diagnosis not present

## 2018-04-18 DIAGNOSIS — I69320 Aphasia following cerebral infarction: Secondary | ICD-10-CM | POA: Diagnosis not present

## 2018-04-18 DIAGNOSIS — Z8673 Personal history of transient ischemic attack (TIA), and cerebral infarction without residual deficits: Secondary | ICD-10-CM | POA: Diagnosis not present

## 2018-04-18 DIAGNOSIS — I480 Paroxysmal atrial fibrillation: Secondary | ICD-10-CM | POA: Diagnosis not present

## 2018-04-18 DIAGNOSIS — G894 Chronic pain syndrome: Secondary | ICD-10-CM | POA: Diagnosis not present

## 2018-04-18 DIAGNOSIS — Z950 Presence of cardiac pacemaker: Secondary | ICD-10-CM | POA: Diagnosis not present

## 2018-04-18 DIAGNOSIS — I2510 Atherosclerotic heart disease of native coronary artery without angina pectoris: Secondary | ICD-10-CM | POA: Diagnosis not present

## 2018-04-18 DIAGNOSIS — Z9181 History of falling: Secondary | ICD-10-CM | POA: Diagnosis not present

## 2018-04-18 DIAGNOSIS — M810 Age-related osteoporosis without current pathological fracture: Secondary | ICD-10-CM | POA: Diagnosis not present

## 2018-04-18 DIAGNOSIS — I110 Hypertensive heart disease with heart failure: Secondary | ICD-10-CM | POA: Diagnosis not present

## 2018-04-18 NOTE — Telephone Encounter (Signed)
PA done on 04/14 OO#[id number] will await decision from pt insurance

## 2018-04-18 NOTE — Patient Outreach (Signed)
[place] [facility]) Care Management  04/18/2018  TIFFANYANN DEROO 01/26/1932 [id number]  Left message with Dr. Merita Norton referral coordinator in reference to the needed PA for [facility].  Plan: Await a call back.  Follow up in 2-3 business days.   Elayne Guerin, PharmD, [place] Clinical Pharmacist [phone]

## 2018-04-18 NOTE — Telephone Encounter (Signed)
PA done on 04/14 DH#[id number] will await decision from pt insurance

## 2018-04-18 NOTE — Telephone Encounter (Signed)
PA done on 04/14 [id number] will await decision from pt insurance

## 2018-04-18 NOTE — Telephone Encounter (Signed)
Insurance will not pay for Savaysa; preferred is Eliquis.

## 2018-04-18 NOTE — Telephone Encounter (Signed)
I never received a request for this. I will need the rejection information from the pharmacy.

## 2018-04-18 NOTE — Telephone Encounter (Signed)
New Message   Denyse Amass from [facility] is calling and  Needs the status of the the prior authorization for a medication Sabaysa

## 2018-04-18 NOTE — Patient Outreach (Signed)
[facility]) Care Management  04/18/2018  ADALEIGH WARF [birth year] [id number]   Received a call from patient's son (Ray).  HIPAA identifiers were obtained.  Ray said the PA should go to Dr. Percival Spanish.  After chart review, Dr. Rosezella [place] office appeared to work on the PA for the patient last week but I did not see documentation of a decision from the insurance.  A message was left at Dr. Rosezella Florida office to investigate the status of the PA.  Plan: Await a call back. Follow up with patient in 2-3 business days.   Elayne Guerin, PharmD, [place] Clinical Pharmacist [phone]

## 2018-04-19 ENCOUNTER — Telehealth: Payer: Self-pay | Admitting: Cardiology

## 2018-04-19 NOTE — Telephone Encounter (Signed)
New message         Pt c/o medication issue:  1. Name of Medication:edoxaban (SAVAYSA) 30 MG TABS tablet  2. How are you currently taking this medication (dosage and times per day)? 1 time daily  3. Are you having a reaction (difficulty breathing--STAT)? no  4. What is your medication issue? Patient's son states that this medication is around $500.00 per month. Please call to discuss.

## 2018-04-19 NOTE — Telephone Encounter (Signed)
Spoke with pt's son, [name], on [place], and he is extremely worried about getting the pt's Savaysa since it is so expensive and he has been paying out of pocket. I called [employer] and it has been denied, but they told me I can call the appeals department at [phone], I spoke with Malachy Mood and gave her all the clinical pertinent information and she said it could take up to 72 hours for an expedited appeal. I informed her the patient will be out of the medication by this Friday which is 48 hours and she said there is nothing she can do but hope they get back to [place] by then. They will inform us via facts and will call the patient's son with their decision. I advised her son that I will forward to Dr. Percival Spanish and his nurse, Hebert Soho, and I will continue to follow up between now and Friday, but need to make Dr. Percival Spanish aware of the potential lapse in treatment. He agreed and wants to be sure he is called back by Friday on what to do over the weekend.

## 2018-04-20 ENCOUNTER — Telehealth: Payer: Self-pay | Admitting: Cardiology

## 2018-04-20 DIAGNOSIS — I5032 Chronic diastolic (congestive) heart failure: Secondary | ICD-10-CM | POA: Diagnosis not present

## 2018-04-20 DIAGNOSIS — I69320 Aphasia following cerebral infarction: Secondary | ICD-10-CM | POA: Diagnosis not present

## 2018-04-20 DIAGNOSIS — E039 Hypothyroidism, unspecified: Secondary | ICD-10-CM | POA: Diagnosis not present

## 2018-04-20 DIAGNOSIS — Z8673 Personal history of transient ischemic attack (TIA), and cerebral infarction without residual deficits: Secondary | ICD-10-CM | POA: Diagnosis not present

## 2018-04-20 DIAGNOSIS — G894 Chronic pain syndrome: Secondary | ICD-10-CM | POA: Diagnosis not present

## 2018-04-20 DIAGNOSIS — Z950 Presence of cardiac pacemaker: Secondary | ICD-10-CM | POA: Diagnosis not present

## 2018-04-20 DIAGNOSIS — I872 Venous insufficiency (chronic) (peripheral): Secondary | ICD-10-CM | POA: Diagnosis not present

## 2018-04-20 DIAGNOSIS — I2510 Atherosclerotic heart disease of native coronary artery without angina pectoris: Secondary | ICD-10-CM | POA: Diagnosis not present

## 2018-04-20 DIAGNOSIS — M810 Age-related osteoporosis without current pathological fracture: Secondary | ICD-10-CM | POA: Diagnosis not present

## 2018-04-20 DIAGNOSIS — I480 Paroxysmal atrial fibrillation: Secondary | ICD-10-CM | POA: Diagnosis not present

## 2018-04-20 DIAGNOSIS — I110 Hypertensive heart disease with heart failure: Secondary | ICD-10-CM | POA: Diagnosis not present

## 2018-04-20 DIAGNOSIS — Z9181 History of falling: Secondary | ICD-10-CM | POA: Diagnosis not present

## 2018-04-20 NOTE — Telephone Encounter (Signed)
Yes but have been seeing pts so will do when I can.  Last time I was on phone for over 30 min.

## 2018-04-20 NOTE — Telephone Encounter (Signed)
I would include phrasing like: Eliquis, Xarelto, and Pradaxa are all contraindicated with Dilantin due to category X interaction involving decreased DOAC drug concentrations. [facility] is the only anticoagulant that is safe to use in this patient, aside from warfarin. Warfarin is not currently recommended for this patient due to frequent monitoring needs; it is not currently safe for an elderly patient with cardiac disease to come to the office frequently for INR checks due to increased risk of COVID exposure.

## 2018-04-20 NOTE — Telephone Encounter (Signed)
I have been working with [facility] for 2 hours(their automated system was not working properly and the 5 people I talked with tried to be helpful but it took awhile to find correct place.)   to appeal prior authorization denial for [facility]  I have expedited appeal so waiting to hear I did give fax at [facility] office -that is only one I have.  But reference number is [id number]. (I as in [name], then [phone])  It may take 72 hours.  Only other option if denied is coumadin with lovenox crossover.

## 2018-04-20 NOTE — Telephone Encounter (Signed)
Member ID- [id number] SS# [id number]

## 2018-04-20 NOTE — Telephone Encounter (Signed)
I believe you guys are working on this? Thanks!

## 2018-04-20 NOTE — Telephone Encounter (Signed)
New Message    Merry Proud is calling because they are appealing the denial for the medication Savaysa    Please call back

## 2018-04-20 NOTE — Telephone Encounter (Signed)
[name] could you help me pt is on Savaysa due to interaction with dilantin and Eliquis/xarelto - her insurance is not wanting to pay for the medication and I am calling back for an appeal.  Is there any info I can use to strengthen the appeal?  And if they again refuse, do I then go to coumadin/lovenox bridge?  Thanks.  Mickel Baas

## 2018-04-20 NOTE — Telephone Encounter (Signed)
Mickel Baas can you please call and appeals, Thanks

## 2018-04-20 NOTE — Telephone Encounter (Signed)
Noted. Thanks.

## 2018-04-21 ENCOUNTER — Other Ambulatory Visit: Payer: Medicare Other

## 2018-04-21 ENCOUNTER — Telehealth: Payer: Self-pay

## 2018-04-21 ENCOUNTER — Ambulatory Visit: Payer: Self-pay | Admitting: Pharmacist

## 2018-04-21 ENCOUNTER — Other Ambulatory Visit: Payer: Self-pay

## 2018-04-21 ENCOUNTER — Other Ambulatory Visit: Payer: Self-pay | Admitting: Pharmacist

## 2018-04-21 ENCOUNTER — Other Ambulatory Visit: Payer: Self-pay | Admitting: Family Medicine

## 2018-04-21 ENCOUNTER — Telehealth: Payer: Self-pay | Admitting: *Deleted

## 2018-04-21 DIAGNOSIS — R350 Frequency of micturition: Secondary | ICD-10-CM | POA: Diagnosis not present

## 2018-04-21 DIAGNOSIS — I110 Hypertensive heart disease with heart failure: Secondary | ICD-10-CM | POA: Diagnosis not present

## 2018-04-21 DIAGNOSIS — Z8673 Personal history of transient ischemic attack (TIA), and cerebral infarction without residual deficits: Secondary | ICD-10-CM | POA: Diagnosis not present

## 2018-04-21 DIAGNOSIS — M810 Age-related osteoporosis without current pathological fracture: Secondary | ICD-10-CM | POA: Diagnosis not present

## 2018-04-21 DIAGNOSIS — I872 Venous insufficiency (chronic) (peripheral): Secondary | ICD-10-CM | POA: Diagnosis not present

## 2018-04-21 DIAGNOSIS — G894 Chronic pain syndrome: Secondary | ICD-10-CM | POA: Diagnosis not present

## 2018-04-21 DIAGNOSIS — Z9181 History of falling: Secondary | ICD-10-CM | POA: Diagnosis not present

## 2018-04-21 DIAGNOSIS — E039 Hypothyroidism, unspecified: Secondary | ICD-10-CM | POA: Diagnosis not present

## 2018-04-21 DIAGNOSIS — I5032 Chronic diastolic (congestive) heart failure: Secondary | ICD-10-CM | POA: Diagnosis not present

## 2018-04-21 DIAGNOSIS — I69320 Aphasia following cerebral infarction: Secondary | ICD-10-CM | POA: Diagnosis not present

## 2018-04-21 DIAGNOSIS — I480 Paroxysmal atrial fibrillation: Secondary | ICD-10-CM | POA: Diagnosis not present

## 2018-04-21 DIAGNOSIS — Z950 Presence of cardiac pacemaker: Secondary | ICD-10-CM | POA: Diagnosis not present

## 2018-04-21 DIAGNOSIS — I2510 Atherosclerotic heart disease of native coronary artery without angina pectoris: Secondary | ICD-10-CM | POA: Diagnosis not present

## 2018-04-21 MED ORDER — CEPHALEXIN 500 MG PO CAPS: 500.0000 mg | ORAL_CAPSULE | Freq: Four times a day (QID) | ORAL | 0 refills | Status: DC

## 2018-04-21 NOTE — Telephone Encounter (Signed)
No samples and no drug rep available.Marland KitchenMarland KitchenJanan Halter received fax from prior authorization and it needed more info which she provided and sent back.

## 2018-04-21 NOTE — Telephone Encounter (Signed)
Can we get any samples to tide [place] over?

## 2018-04-21 NOTE — Telephone Encounter (Signed)
I am calling the [place] drug company pt assistance to see if Rep in our area to possibly get samples for pt today.. will update when I get more information.

## 2018-04-21 NOTE — Telephone Encounter (Signed)
      Got below orders from PharmD - Erasmo Downer A. For pt to start lovenox to eleminate gap in medication while we wait on the response from insurance company.    "She's going to need lovenox 80 mg qd until they can get the Savaysa figured out. We might have some samples in [facility], I can see if Raquel is still there  Left detailed message for son Thurmond Butts to call back so we can get samples to him today.   Spoke with pt son he has medication setup with her pharmacy and will get reimbursed from insurance one cleared as does not want to do shots at this time. Told to call our office back on Tuesday next week if have not heard back from [place], he verbalized understanding.

## 2018-04-21 NOTE — Telephone Encounter (Signed)
Fax from [employer] will come to [address] office only fax number I had

## 2018-04-21 NOTE — Telephone Encounter (Signed)
Patient's spouse returned your call.

## 2018-04-21 NOTE — Telephone Encounter (Signed)
Expedited appeal for Pt Asst for Savaysa was faxed to [facility]. This was filled out by Janan Halter and faxed to Rockney Ghee at [facility] to get Dr Hochrein's signature.

## 2018-04-21 NOTE — Patient Outreach (Addendum)
[facility]) Care Management  04/21/2018  [name] [birth year] [id number]   Called patient's son, [name], regarding the prior authorization for [facility]. Unfortunately, he did not answer the phone. HIPAA compliant message was left on his voicemail.  From chart review, Dr. Alver Fisher office has been working tirelessly to get the authorization approved and requested an expedited decision.  The insurance company has up to 72 hours to respond.  Plan: Follow up in 1-2 business days.   Elayne Guerin, PharmD, BCACP [facility] Clinical Pharmacist [phone]  ADDENDUM  Patient's son [name]) called me back. HIPAA identifiers were obtained. The status of the patient's prior authorization was explained.  Plan: Continue to follow up in 1-2 business days.   Elayne Guerin, PharmD, [place] Clinical Pharmacist [phone]

## 2018-04-21 NOTE — Progress Notes (Signed)
Patients son aware 

## 2018-04-21 NOTE — Progress Notes (Signed)
Based on patient's symptoms and blood in the urine, I have sent for Keflex, her culture is still pending as well.

## 2018-04-21 NOTE — Telephone Encounter (Signed)
Will forward this note to Lyla Son as she will fax form to Dr Cherlyn Cushing nurse.

## 2018-04-21 NOTE — Telephone Encounter (Signed)
Pt is having urinary frequency, pressure, speech therapist with here now, nursing does not come out till tuesday can an order for ua & cx be placed for the caregiver to bring in sample Verbal ok given by Dr. Warrick Parisian

## 2018-04-21 NOTE — Telephone Encounter (Signed)
It looks like cardiology's office, Dr. Percival Spanish is already working on a prior authorization for this so I do not know why it is coming to our direction but in his last note when she left the hospital. 3-PAF; On metoprolol, fleicanide, digoxin. On lovenox.  HR elevated today. Started Cardizem Gtt. Cardiology consulted.  Discharge on  edoxaban.unable to use eliquis due to interaction with phenytoin.  On oral Cardizem. Caryl Pina, MD [facility] 04/21/2018, 12:55 PM

## 2018-04-21 NOTE — Telephone Encounter (Signed)
I have checked both offices and there are no samples available.

## 2018-04-21 NOTE — Telephone Encounter (Signed)
Returned call to patient's husband advised a appeal for Savaysa was faxed to [facility] today at 12:46 pm.Advised we have not heard back for approval.I will send message to Dr.Hochrein's CMA to follow up on.

## 2018-04-22 ENCOUNTER — Telehealth: Payer: Self-pay | Admitting: Family Medicine

## 2018-04-22 DIAGNOSIS — I5032 Chronic diastolic (congestive) heart failure: Secondary | ICD-10-CM | POA: Diagnosis not present

## 2018-04-22 DIAGNOSIS — I110 Hypertensive heart disease with heart failure: Secondary | ICD-10-CM | POA: Diagnosis not present

## 2018-04-22 DIAGNOSIS — Z8673 Personal history of transient ischemic attack (TIA), and cerebral infarction without residual deficits: Secondary | ICD-10-CM | POA: Diagnosis not present

## 2018-04-22 DIAGNOSIS — I2510 Atherosclerotic heart disease of native coronary artery without angina pectoris: Secondary | ICD-10-CM | POA: Diagnosis not present

## 2018-04-22 DIAGNOSIS — Z9181 History of falling: Secondary | ICD-10-CM | POA: Diagnosis not present

## 2018-04-22 DIAGNOSIS — I480 Paroxysmal atrial fibrillation: Secondary | ICD-10-CM | POA: Diagnosis not present

## 2018-04-22 DIAGNOSIS — M810 Age-related osteoporosis without current pathological fracture: Secondary | ICD-10-CM | POA: Diagnosis not present

## 2018-04-22 DIAGNOSIS — Z950 Presence of cardiac pacemaker: Secondary | ICD-10-CM | POA: Diagnosis not present

## 2018-04-22 DIAGNOSIS — I872 Venous insufficiency (chronic) (peripheral): Secondary | ICD-10-CM | POA: Diagnosis not present

## 2018-04-22 DIAGNOSIS — G894 Chronic pain syndrome: Secondary | ICD-10-CM | POA: Diagnosis not present

## 2018-04-22 DIAGNOSIS — E039 Hypothyroidism, unspecified: Secondary | ICD-10-CM | POA: Diagnosis not present

## 2018-04-22 DIAGNOSIS — I69320 Aphasia following cerebral infarction: Secondary | ICD-10-CM | POA: Diagnosis not present

## 2018-04-22 LAB — URINALYSIS, COMPLETE
Bilirubin, UA: NEGATIVE
Glucose, UA: NEGATIVE
Ketones, UA: NEGATIVE
Nitrite, UA: NEGATIVE
Protein,UA: NEGATIVE
Specific Gravity, UA: <=1.005 — AB (ref 1.005–1.030)
Urobilinogen, Ur: 0.2 mg/dL (ref 0.2–1.0)
pH, UA: 6.5 (ref 5.0–7.5)

## 2018-04-22 LAB — URINE CULTURE

## 2018-04-22 LAB — MICROSCOPIC EXAMINATION: Casts: NONE SEEN /lpf

## 2018-04-22 NOTE — Telephone Encounter (Signed)
**  [place] After Hours/ Emergency Line Call**  Patient: [name], [name] from [facility].  PCP: Dettinger, Fransisca Kaufmann, MD  Endorsing urinary urgency. Patient's HH RN calling on behalf of patient regarding missed call.  Urine specimen dropped off yesterday.  Appears to have infection.  Keflex rx'd by PCP. I informed of medication and instructions. Red flags discussed.  Will forward to PCP.  Ashly M. Lajuana Ripple, DO

## 2018-04-24 ENCOUNTER — Encounter: Payer: Self-pay | Admitting: Pharmacist

## 2018-04-24 ENCOUNTER — Other Ambulatory Visit: Payer: Self-pay | Admitting: Pharmacist

## 2018-04-24 DIAGNOSIS — M810 Age-related osteoporosis without current pathological fracture: Secondary | ICD-10-CM | POA: Diagnosis not present

## 2018-04-24 DIAGNOSIS — I872 Venous insufficiency (chronic) (peripheral): Secondary | ICD-10-CM | POA: Diagnosis not present

## 2018-04-24 DIAGNOSIS — Z950 Presence of cardiac pacemaker: Secondary | ICD-10-CM | POA: Diagnosis not present

## 2018-04-24 DIAGNOSIS — E039 Hypothyroidism, unspecified: Secondary | ICD-10-CM | POA: Diagnosis not present

## 2018-04-24 DIAGNOSIS — G894 Chronic pain syndrome: Secondary | ICD-10-CM | POA: Diagnosis not present

## 2018-04-24 DIAGNOSIS — I110 Hypertensive heart disease with heart failure: Secondary | ICD-10-CM | POA: Diagnosis not present

## 2018-04-24 DIAGNOSIS — Z9181 History of falling: Secondary | ICD-10-CM | POA: Diagnosis not present

## 2018-04-24 DIAGNOSIS — I480 Paroxysmal atrial fibrillation: Secondary | ICD-10-CM | POA: Diagnosis not present

## 2018-04-24 DIAGNOSIS — I2510 Atherosclerotic heart disease of native coronary artery without angina pectoris: Secondary | ICD-10-CM | POA: Diagnosis not present

## 2018-04-24 DIAGNOSIS — I69320 Aphasia following cerebral infarction: Secondary | ICD-10-CM | POA: Diagnosis not present

## 2018-04-24 DIAGNOSIS — Z8673 Personal history of transient ischemic attack (TIA), and cerebral infarction without residual deficits: Secondary | ICD-10-CM | POA: Diagnosis not present

## 2018-04-24 DIAGNOSIS — I5032 Chronic diastolic (congestive) heart failure: Secondary | ICD-10-CM | POA: Diagnosis not present

## 2018-04-24 NOTE — Telephone Encounter (Signed)
Per pt's sons call on Saturday 18th of April  just following up on the prescription status for apporval on his mother's medication.  Please give him a call.

## 2018-04-24 NOTE — Telephone Encounter (Signed)
Reached out [place] with [facility] to follow up on appeal as nothing has come over the fax at [place] or [address].   Finally got in contact with Merry Proud, pt has been approved for the [facility] 30mg . It was backed dated and approved from 04/08/2018 - 01/04/2019. Pt should be reimbursed by their pharmacy for the medication if purchased after 04/08/2018. Approval #: [id number]

## 2018-04-24 NOTE — Patient Outreach (Signed)
[facility]) Care Management  04/24/2018  KURT AZIMI 04/05/1932 [id number]   [place] to check on the status of the patient's [facility] prescription. They reported the prescription went through with prior authorization. The copay is $350 of which $50 was the patient's deductible (according to pharmacy technician).  Patient's son was called. HIPAA identifiers were obtained. Ray was informed about the prior authorization and the copay cost.   [facility] does not have a patient assistance program. They have a savings card program but patient's with Medicare cannot use the savings card.  This was explained to [place]. He said the $350 for 90 days was better than $500 for 30 days. Ray was educated about the donut hole and how to review his mother's EOB each month.  Plan: Close patient's pharmacy case as her prior authorization went through. Ray understands that he or his mother can call me at anytime in the future with medication questions or concerns.  Alert other [facility] members involved with the patient's care about pharmacy case closure.  Elayne Guerin, PharmD, [place] Clinical Pharmacist [phone]

## 2018-04-24 NOTE — Telephone Encounter (Signed)
Spoke with the pts daughter in law and advised her that [facility] has been approved...   Called [facility] and gave the approval number and dates for the pt to be reimburse but the pharmacist says that they need to call her with the insurance information and they wil submit a request to reimburse them.. Pts daughter in law advised and will call the pharmacy.  Approval RX given to [facility] for future refills of the [place].

## 2018-04-25 DIAGNOSIS — Z950 Presence of cardiac pacemaker: Secondary | ICD-10-CM | POA: Diagnosis not present

## 2018-04-25 DIAGNOSIS — I69320 Aphasia following cerebral infarction: Secondary | ICD-10-CM | POA: Diagnosis not present

## 2018-04-25 DIAGNOSIS — I5032 Chronic diastolic (congestive) heart failure: Secondary | ICD-10-CM | POA: Diagnosis not present

## 2018-04-25 DIAGNOSIS — Z9181 History of falling: Secondary | ICD-10-CM | POA: Diagnosis not present

## 2018-04-25 DIAGNOSIS — I110 Hypertensive heart disease with heart failure: Secondary | ICD-10-CM | POA: Diagnosis not present

## 2018-04-25 DIAGNOSIS — Z8673 Personal history of transient ischemic attack (TIA), and cerebral infarction without residual deficits: Secondary | ICD-10-CM | POA: Diagnosis not present

## 2018-04-25 DIAGNOSIS — I480 Paroxysmal atrial fibrillation: Secondary | ICD-10-CM | POA: Diagnosis not present

## 2018-04-25 DIAGNOSIS — I2510 Atherosclerotic heart disease of native coronary artery without angina pectoris: Secondary | ICD-10-CM | POA: Diagnosis not present

## 2018-04-25 DIAGNOSIS — I872 Venous insufficiency (chronic) (peripheral): Secondary | ICD-10-CM | POA: Diagnosis not present

## 2018-04-25 DIAGNOSIS — E039 Hypothyroidism, unspecified: Secondary | ICD-10-CM | POA: Diagnosis not present

## 2018-04-25 DIAGNOSIS — G894 Chronic pain syndrome: Secondary | ICD-10-CM | POA: Diagnosis not present

## 2018-04-25 DIAGNOSIS — M810 Age-related osteoporosis without current pathological fracture: Secondary | ICD-10-CM | POA: Diagnosis not present

## 2018-04-26 ENCOUNTER — Other Ambulatory Visit: Payer: Self-pay

## 2018-04-26 DIAGNOSIS — M810 Age-related osteoporosis without current pathological fracture: Secondary | ICD-10-CM | POA: Diagnosis not present

## 2018-04-26 DIAGNOSIS — I480 Paroxysmal atrial fibrillation: Secondary | ICD-10-CM | POA: Diagnosis not present

## 2018-04-26 DIAGNOSIS — Z9181 History of falling: Secondary | ICD-10-CM | POA: Diagnosis not present

## 2018-04-26 DIAGNOSIS — I872 Venous insufficiency (chronic) (peripheral): Secondary | ICD-10-CM | POA: Diagnosis not present

## 2018-04-26 DIAGNOSIS — I5032 Chronic diastolic (congestive) heart failure: Secondary | ICD-10-CM | POA: Diagnosis not present

## 2018-04-26 DIAGNOSIS — G894 Chronic pain syndrome: Secondary | ICD-10-CM | POA: Diagnosis not present

## 2018-04-26 DIAGNOSIS — Z950 Presence of cardiac pacemaker: Secondary | ICD-10-CM | POA: Diagnosis not present

## 2018-04-26 DIAGNOSIS — I69320 Aphasia following cerebral infarction: Secondary | ICD-10-CM | POA: Diagnosis not present

## 2018-04-26 DIAGNOSIS — E039 Hypothyroidism, unspecified: Secondary | ICD-10-CM | POA: Diagnosis not present

## 2018-04-26 DIAGNOSIS — I2510 Atherosclerotic heart disease of native coronary artery without angina pectoris: Secondary | ICD-10-CM | POA: Diagnosis not present

## 2018-04-26 DIAGNOSIS — Z8673 Personal history of transient ischemic attack (TIA), and cerebral infarction without residual deficits: Secondary | ICD-10-CM | POA: Diagnosis not present

## 2018-04-26 DIAGNOSIS — I110 Hypertensive heart disease with heart failure: Secondary | ICD-10-CM | POA: Diagnosis not present

## 2018-04-26 NOTE — Patient Outreach (Signed)
[facility]) Care Management  04/26/2018  [name] 1932/01/09 [id number]  Called member at preferred number and left voicemail message. Called member's secondary number and member answered phone.Member stated she is doing well and denies recent falls. Member began to state "I don't know" to questions asked. Member stated preference for RN CM to speak to her son [name]. Ray on phone and HIPPA identity verified. Ray verified 2 HIPPA identifiers for member.   Assessed for any needs or concerns and Ray stated that "her memory is getting worse"; however, stated that member has all her needs met and family prefers to care for member at home.   Ray denies medication changes and verbalized appreciation for [facility] Pharmacist's assistance with Savaysa medication.  Ray stated [employer] prepares [employer] and assists member with care needs.  Family denies any needs and concerns at this time. Ray confirmed member's PCP appointment on 04-06-2018 and stated that member doesn't have any upcoming appointments until the "end of next month". Ray denies any transportation concerns for member.  Will follow up with member next month.  THN CM Care Plan Problem One     Most Recent Value  Care Plan Problem One  Risk for hospital readmission as evidenced by recent admission  Role Documenting the Problem One  Care Management [place] for Problem One  Active  [facility] Long Term Goal   Member will not be readmitted for next 31 days  THN Long Term Goal Start Date  04/06/18  Interventions for Problem One Long Term Goal  Assessed member's current status and concerns  THN CM Short Term Goal #1   Risk for injury related to history of and recent hospitalization for seizures  THN CM Short Term Goal #1 Start Date  04/06/18  Interventions for Short Term Goal #1  Reinforced education concerning compliance to medications  THN CM Short Term Goal #2   High risk for falls related to  PMH: Dementia as evidenced by member stating "I don't know" to questions about health history  THN CM Short Term Goal #2 Start Date  04/06/18  Interventions for Short Term Goal #2  Assessed for recent falls    [facility] CM Care Plan Problem Two     Most Recent Value  Care Plan Problem Two  "Knowledge Deficits related to COVID-19 and impact on patient self-Health management  Role Documenting the Problem Two  Care Management [place] for Problem Two  Active  Interventions for Problem Two Long Term Goal   Assessed for compliance of good handwashing  THN Long Term Goal  Over next 31 days member will verbalize measures to prevent exposure to COVID-19  [facility] Long Term Goal Start Date  04/17/18  THN CM Short Term Goal #1   "Over the next 30 days, patient will verbalize basic understanding of COVID-19 impact on individual health and self-health management as evidenced by verbalization of basic understanding of COVID-19 as a viral disease  THN CM Short Term Goal #1 Start Date  04/17/18  Interventions for Short Term Goal #2   Attempt to further speak to member concerning COVID 19 and member stating "I don't know"      [name], RN-BSN  [place] Management Coordinator  [phone] [email or web address]

## 2018-04-27 DIAGNOSIS — I480 Paroxysmal atrial fibrillation: Secondary | ICD-10-CM | POA: Diagnosis not present

## 2018-04-27 DIAGNOSIS — I2510 Atherosclerotic heart disease of native coronary artery without angina pectoris: Secondary | ICD-10-CM | POA: Diagnosis not present

## 2018-04-27 DIAGNOSIS — Z9181 History of falling: Secondary | ICD-10-CM | POA: Diagnosis not present

## 2018-04-27 DIAGNOSIS — I69320 Aphasia following cerebral infarction: Secondary | ICD-10-CM | POA: Diagnosis not present

## 2018-04-27 DIAGNOSIS — I110 Hypertensive heart disease with heart failure: Secondary | ICD-10-CM | POA: Diagnosis not present

## 2018-04-27 DIAGNOSIS — Z8673 Personal history of transient ischemic attack (TIA), and cerebral infarction without residual deficits: Secondary | ICD-10-CM | POA: Diagnosis not present

## 2018-04-27 DIAGNOSIS — G894 Chronic pain syndrome: Secondary | ICD-10-CM | POA: Diagnosis not present

## 2018-04-27 DIAGNOSIS — M810 Age-related osteoporosis without current pathological fracture: Secondary | ICD-10-CM | POA: Diagnosis not present

## 2018-04-27 DIAGNOSIS — E039 Hypothyroidism, unspecified: Secondary | ICD-10-CM | POA: Diagnosis not present

## 2018-04-27 DIAGNOSIS — I872 Venous insufficiency (chronic) (peripheral): Secondary | ICD-10-CM | POA: Diagnosis not present

## 2018-04-27 DIAGNOSIS — Z950 Presence of cardiac pacemaker: Secondary | ICD-10-CM | POA: Diagnosis not present

## 2018-04-27 DIAGNOSIS — I5032 Chronic diastolic (congestive) heart failure: Secondary | ICD-10-CM | POA: Diagnosis not present

## 2018-04-27 NOTE — Patient Outreach (Deleted)
[facility]) Care Management  04/27/2018  [name] [birth year] [id number]  Called member at preferred number and phone was picked up then call was dropped. Recalled number and member's spouse stated that member is "asleep" and call was disconnected.  Will return call to member within 3-4 business days.  Terri Mola "[name]" [name], [name]  [facility] Care Management  Community Care Management Coordinator  [phone] [email or web address]

## 2018-04-28 DIAGNOSIS — I872 Venous insufficiency (chronic) (peripheral): Secondary | ICD-10-CM | POA: Diagnosis not present

## 2018-04-28 DIAGNOSIS — I2510 Atherosclerotic heart disease of native coronary artery without angina pectoris: Secondary | ICD-10-CM | POA: Diagnosis not present

## 2018-04-28 DIAGNOSIS — I110 Hypertensive heart disease with heart failure: Secondary | ICD-10-CM | POA: Diagnosis not present

## 2018-04-28 DIAGNOSIS — M810 Age-related osteoporosis without current pathological fracture: Secondary | ICD-10-CM | POA: Diagnosis not present

## 2018-04-28 DIAGNOSIS — Z950 Presence of cardiac pacemaker: Secondary | ICD-10-CM | POA: Diagnosis not present

## 2018-04-28 DIAGNOSIS — I69320 Aphasia following cerebral infarction: Secondary | ICD-10-CM | POA: Diagnosis not present

## 2018-04-28 DIAGNOSIS — Z9181 History of falling: Secondary | ICD-10-CM | POA: Diagnosis not present

## 2018-04-28 DIAGNOSIS — I5032 Chronic diastolic (congestive) heart failure: Secondary | ICD-10-CM | POA: Diagnosis not present

## 2018-04-28 DIAGNOSIS — E039 Hypothyroidism, unspecified: Secondary | ICD-10-CM | POA: Diagnosis not present

## 2018-04-28 DIAGNOSIS — I480 Paroxysmal atrial fibrillation: Secondary | ICD-10-CM | POA: Diagnosis not present

## 2018-04-28 DIAGNOSIS — G894 Chronic pain syndrome: Secondary | ICD-10-CM | POA: Diagnosis not present

## 2018-04-28 DIAGNOSIS — Z8673 Personal history of transient ischemic attack (TIA), and cerebral infarction without residual deficits: Secondary | ICD-10-CM | POA: Diagnosis not present

## 2018-05-01 ENCOUNTER — Ambulatory Visit: Payer: Medicare Other

## 2018-05-01 DIAGNOSIS — I480 Paroxysmal atrial fibrillation: Secondary | ICD-10-CM | POA: Diagnosis not present

## 2018-05-01 DIAGNOSIS — I110 Hypertensive heart disease with heart failure: Secondary | ICD-10-CM | POA: Diagnosis not present

## 2018-05-01 DIAGNOSIS — Z9181 History of falling: Secondary | ICD-10-CM | POA: Diagnosis not present

## 2018-05-01 DIAGNOSIS — I2510 Atherosclerotic heart disease of native coronary artery without angina pectoris: Secondary | ICD-10-CM | POA: Diagnosis not present

## 2018-05-01 DIAGNOSIS — G894 Chronic pain syndrome: Secondary | ICD-10-CM | POA: Diagnosis not present

## 2018-05-01 DIAGNOSIS — E039 Hypothyroidism, unspecified: Secondary | ICD-10-CM | POA: Diagnosis not present

## 2018-05-01 DIAGNOSIS — M810 Age-related osteoporosis without current pathological fracture: Secondary | ICD-10-CM | POA: Diagnosis not present

## 2018-05-01 DIAGNOSIS — Z8673 Personal history of transient ischemic attack (TIA), and cerebral infarction without residual deficits: Secondary | ICD-10-CM | POA: Diagnosis not present

## 2018-05-01 DIAGNOSIS — I5032 Chronic diastolic (congestive) heart failure: Secondary | ICD-10-CM | POA: Diagnosis not present

## 2018-05-01 DIAGNOSIS — I872 Venous insufficiency (chronic) (peripheral): Secondary | ICD-10-CM | POA: Diagnosis not present

## 2018-05-01 DIAGNOSIS — Z950 Presence of cardiac pacemaker: Secondary | ICD-10-CM | POA: Diagnosis not present

## 2018-05-01 DIAGNOSIS — I69320 Aphasia following cerebral infarction: Secondary | ICD-10-CM | POA: Diagnosis not present

## 2018-05-02 DIAGNOSIS — G894 Chronic pain syndrome: Secondary | ICD-10-CM | POA: Diagnosis not present

## 2018-05-02 DIAGNOSIS — E039 Hypothyroidism, unspecified: Secondary | ICD-10-CM | POA: Diagnosis not present

## 2018-05-02 DIAGNOSIS — I69320 Aphasia following cerebral infarction: Secondary | ICD-10-CM | POA: Diagnosis not present

## 2018-05-02 DIAGNOSIS — I110 Hypertensive heart disease with heart failure: Secondary | ICD-10-CM | POA: Diagnosis not present

## 2018-05-02 DIAGNOSIS — Z8673 Personal history of transient ischemic attack (TIA), and cerebral infarction without residual deficits: Secondary | ICD-10-CM | POA: Diagnosis not present

## 2018-05-02 DIAGNOSIS — I872 Venous insufficiency (chronic) (peripheral): Secondary | ICD-10-CM | POA: Diagnosis not present

## 2018-05-02 DIAGNOSIS — M810 Age-related osteoporosis without current pathological fracture: Secondary | ICD-10-CM | POA: Diagnosis not present

## 2018-05-02 DIAGNOSIS — I480 Paroxysmal atrial fibrillation: Secondary | ICD-10-CM | POA: Diagnosis not present

## 2018-05-02 DIAGNOSIS — I5032 Chronic diastolic (congestive) heart failure: Secondary | ICD-10-CM | POA: Diagnosis not present

## 2018-05-02 DIAGNOSIS — I2510 Atherosclerotic heart disease of native coronary artery without angina pectoris: Secondary | ICD-10-CM | POA: Diagnosis not present

## 2018-05-02 DIAGNOSIS — Z9181 History of falling: Secondary | ICD-10-CM | POA: Diagnosis not present

## 2018-05-02 DIAGNOSIS — Z950 Presence of cardiac pacemaker: Secondary | ICD-10-CM | POA: Diagnosis not present

## 2018-05-04 ENCOUNTER — Ambulatory Visit (INDEPENDENT_AMBULATORY_CARE_PROVIDER_SITE_OTHER): Payer: Medicare Other | Admitting: Family Medicine

## 2018-05-04 ENCOUNTER — Other Ambulatory Visit: Payer: Self-pay

## 2018-05-04 DIAGNOSIS — N3000 Acute cystitis without hematuria: Secondary | ICD-10-CM | POA: Diagnosis not present

## 2018-05-04 MED ORDER — CEPHALEXIN 500 MG PO CAPS: 500.0000 mg | ORAL_CAPSULE | Freq: Four times a day (QID) | ORAL | 0 refills | Status: DC

## 2018-05-04 NOTE — Progress Notes (Signed)
Virtual Visit via telephone Note  I connected with [name] on 05/04/18 at 1315 by telephone and verified that I am speaking with the correct person using two identifiers. [name] is currently located at home and sister, [name] are currently with her during visit. The provider, Fransisca Kaufmann Dettinger, MD is located in their office at time of visit.  Call ended at 1322  I discussed the limitations, risks, security and privacy concerns of performing an evaluation and management service by telephone and the availability of in person appointments. I also discussed with the patient that there may be a patient responsible charge related to this service. The patient expressed understanding and agreed to proceed.   History and Present Illness: Patient says she started having pressure in her bladder today and her lat UTI got completley resolved. Patient denies any fevers or chills or hematuria.  Patient is frequency and urgency.  She says her biggest thing is that pressure coming around to her back a little bit but mostly just in the front she is having pressure in her bladder region.  She says is very similar to what she had earlier on in the month and she took the Keflex and said it completely resolved but she is having it again.  No diagnosis found.  Outpatient Encounter Medications as of 05/04/2018  Medication Sig  . Acetaminophen (TYLENOL) 325 MG CAPS Take 650 mg by mouth every 6 (six) hours as needed for mild pain.  Marland Kitchen ARTIFICIAL TEAR OP Apply 2 drops to eye daily as needed (Dryness). To the left eye  . atorvastatin (LIPITOR) 20 MG tablet Take 1 tablet (20 mg total) by mouth at bedtime.  . cephALEXin (KEFLEX) 500 MG capsule Take 1 capsule (500 mg total) by mouth 4 (four) times daily.  . digoxin (LANOXIN) 0.125 MG tablet Take 1 tablet (0.125 mg total) by mouth daily. (Patient taking differently: Take 0.125 mg by mouth every morning. )  . diltiazem (CARDIZEM CD) 180 MG 24 hr capsule Take 1  capsule (180 mg total) by mouth daily.  Marland Kitchen donepezil (ARICEPT) 5 MG tablet Take 1 tablet (5 mg total) by mouth at bedtime. FOR MEMORY  . edoxaban (SAVAYSA) 30 MG TABS tablet Take 1 tablet (30 mg total) by mouth daily.  . hydrALAZINE (APRESOLINE) 10 MG tablet Take 1 tablet (10 mg total) by mouth 2 (two) times daily.  Marland Kitchen levothyroxine (SYNTHROID, LEVOTHROID) 50 MCG tablet Take 1 tablet daily before breakfast (Patient taking differently: Take 50 mcg by mouth daily before breakfast. )  . metoprolol succinate (TOPROL-XL) 100 MG 24 hr tablet Take 1 tablet (100 mg total) by mouth daily. Take with or immediately following a meal. (Patient taking differently: Take 100 mg by mouth every morning. )  . Multiple Vitamin (MULTIVITAMIN WITH MINERALS) TABS tablet Take 1 tablet by mouth every morning.  . phenytoin (DILANTIN) 100 MG ER capsule Take 1 capsule (100 mg total) by mouth 3 (three) times daily.  . tamsulosin (FLOMAX) 0.4 MG CAPS capsule Take 1 capsule (0.4 mg total) by mouth daily.   No facility-administered encounter medications on file as of 05/04/2018.     Review of Systems  Constitutional: Negative for chills and fever.  Eyes: Negative for visual disturbance.  Respiratory: Negative for chest tightness and shortness of breath.   Cardiovascular: Negative for chest pain and leg swelling.  Gastrointestinal: Negative for abdominal pain.  Genitourinary: Positive for dysuria, frequency and urgency. Negative for difficulty urinating, flank pain, hematuria, vaginal  bleeding, vaginal discharge and vaginal pain.  Musculoskeletal: Negative for back pain and gait problem.  Skin: Negative for rash.  Neurological: Negative for light-headedness and headaches.  Psychiatric/Behavioral: Negative for agitation and behavioral problems.  All other systems reviewed and are negative.   Observations/Objective: Patient sounds comfortable and in no acute distress on the phone  Assessment and Plan: Problem List Items  Addressed This Visit    None    Visit Diagnoses    Acute cystitis without hematuria    -  Primary       Follow Up Instructions: As needed    I discussed the assessment and treatment plan with the patient. The patient was provided an opportunity to ask questions and all were answered. The patient agreed with the plan and demonstrated an understanding of the instructions.   The patient was advised to call back or seek an in-person evaluation if the symptoms worsen or if the condition fails to improve as anticipated.  The above assessment and management plan was discussed with the patient. The patient verbalized understanding of and has agreed to the management plan. Patient is aware to call the clinic if symptoms persist or worsen. Patient is aware when to return to the clinic for a follow-up visit. Patient educated on when it is appropriate to go to the emergency department.    I provided 7 minutes of non-face-to-face time during this encounter.    Worthy Rancher, MD

## 2018-05-08 ENCOUNTER — Other Ambulatory Visit: Payer: Self-pay

## 2018-05-08 DIAGNOSIS — I2510 Atherosclerotic heart disease of native coronary artery without angina pectoris: Secondary | ICD-10-CM | POA: Diagnosis not present

## 2018-05-08 DIAGNOSIS — Z950 Presence of cardiac pacemaker: Secondary | ICD-10-CM | POA: Diagnosis not present

## 2018-05-08 DIAGNOSIS — I69320 Aphasia following cerebral infarction: Secondary | ICD-10-CM | POA: Diagnosis not present

## 2018-05-08 DIAGNOSIS — I480 Paroxysmal atrial fibrillation: Secondary | ICD-10-CM | POA: Diagnosis not present

## 2018-05-08 DIAGNOSIS — E039 Hypothyroidism, unspecified: Secondary | ICD-10-CM | POA: Diagnosis not present

## 2018-05-08 DIAGNOSIS — M810 Age-related osteoporosis without current pathological fracture: Secondary | ICD-10-CM | POA: Diagnosis not present

## 2018-05-08 DIAGNOSIS — G894 Chronic pain syndrome: Secondary | ICD-10-CM | POA: Diagnosis not present

## 2018-05-08 DIAGNOSIS — I5032 Chronic diastolic (congestive) heart failure: Secondary | ICD-10-CM | POA: Diagnosis not present

## 2018-05-08 DIAGNOSIS — I872 Venous insufficiency (chronic) (peripheral): Secondary | ICD-10-CM | POA: Diagnosis not present

## 2018-05-08 DIAGNOSIS — I110 Hypertensive heart disease with heart failure: Secondary | ICD-10-CM | POA: Diagnosis not present

## 2018-05-08 DIAGNOSIS — Z8673 Personal history of transient ischemic attack (TIA), and cerebral infarction without residual deficits: Secondary | ICD-10-CM | POA: Diagnosis not present

## 2018-05-08 DIAGNOSIS — Z9181 History of falling: Secondary | ICD-10-CM | POA: Diagnosis not present

## 2018-05-08 NOTE — Patient Outreach (Addendum)
[name]  05/08/2018  [name] 04-12-1932 [id number]  Called member at preferred number and member verified 2 HIPPA identities. Member stated she is doing well. Member, today answered appropriately to questions asked.   Member denies any current needs and states that no changes in her medications as far as she knows.   Member unable to state whether or not she has received [name] package. Member denies recent falls and states she has been ambulating with her cane. Member denies pain.  Discussed COVID-19 again with member and member stated " COVID is contagious, you get sore throat, tired. I wash my hands and I don't go outside in public and I don't be close to anyone". Other signs and symptoms explained to member such as fever. Member stated if she experiences any of these symptoms she will call her provider and member understands when to call 911.   Member stated that her sister administered member's medications this morning and member denies any concerns.   Member requested RN CM phone number and number provided to member. Member restated the number correctly today.   Will follow up with member next month.   [name], RN-BSN  [facility] Care Management  Community Care Management Coordinator  [phone] [name]

## 2018-05-09 DIAGNOSIS — Z950 Presence of cardiac pacemaker: Secondary | ICD-10-CM | POA: Diagnosis not present

## 2018-05-09 DIAGNOSIS — I110 Hypertensive heart disease with heart failure: Secondary | ICD-10-CM | POA: Diagnosis not present

## 2018-05-09 DIAGNOSIS — I69320 Aphasia following cerebral infarction: Secondary | ICD-10-CM | POA: Diagnosis not present

## 2018-05-09 DIAGNOSIS — Z8673 Personal history of transient ischemic attack (TIA), and cerebral infarction without residual deficits: Secondary | ICD-10-CM | POA: Diagnosis not present

## 2018-05-09 DIAGNOSIS — M810 Age-related osteoporosis without current pathological fracture: Secondary | ICD-10-CM | POA: Diagnosis not present

## 2018-05-09 DIAGNOSIS — I2510 Atherosclerotic heart disease of native coronary artery without angina pectoris: Secondary | ICD-10-CM | POA: Diagnosis not present

## 2018-05-09 DIAGNOSIS — I5032 Chronic diastolic (congestive) heart failure: Secondary | ICD-10-CM | POA: Diagnosis not present

## 2018-05-09 DIAGNOSIS — G894 Chronic pain syndrome: Secondary | ICD-10-CM | POA: Diagnosis not present

## 2018-05-09 DIAGNOSIS — E039 Hypothyroidism, unspecified: Secondary | ICD-10-CM | POA: Diagnosis not present

## 2018-05-09 DIAGNOSIS — I872 Venous insufficiency (chronic) (peripheral): Secondary | ICD-10-CM | POA: Diagnosis not present

## 2018-05-09 DIAGNOSIS — Z9181 History of falling: Secondary | ICD-10-CM | POA: Diagnosis not present

## 2018-05-09 DIAGNOSIS — I480 Paroxysmal atrial fibrillation: Secondary | ICD-10-CM | POA: Diagnosis not present

## 2018-05-11 ENCOUNTER — Telehealth: Payer: Self-pay | Admitting: Family Medicine

## 2018-05-12 ENCOUNTER — Other Ambulatory Visit: Payer: Self-pay | Admitting: Family Medicine

## 2018-05-15 ENCOUNTER — Telehealth: Payer: Self-pay | Admitting: Family Medicine

## 2018-05-17 ENCOUNTER — Ambulatory Visit (INDEPENDENT_AMBULATORY_CARE_PROVIDER_SITE_OTHER): Payer: Medicare Other | Admitting: Family Medicine

## 2018-05-17 ENCOUNTER — Other Ambulatory Visit: Payer: Self-pay

## 2018-05-17 ENCOUNTER — Encounter: Payer: Self-pay | Admitting: Family Medicine

## 2018-05-17 ENCOUNTER — Other Ambulatory Visit: Payer: Self-pay | Admitting: *Deleted

## 2018-05-17 DIAGNOSIS — N390 Urinary tract infection, site not specified: Secondary | ICD-10-CM

## 2018-05-17 DIAGNOSIS — G4700 Insomnia, unspecified: Secondary | ICD-10-CM

## 2018-05-17 MED ORDER — MELATONIN 5 MG PO TABS: 5.0000 mg | ORAL_TABLET | Freq: Every day | ORAL | 2 refills | Status: DC

## 2018-05-17 MED ORDER — TRAZODONE HCL 50 MG PO TABS: 25.0000 mg | ORAL_TABLET | Freq: Every evening | ORAL | 1 refills | Status: DC | PRN

## 2018-05-17 NOTE — Progress Notes (Signed)
Virtual Visit via telephone Note  I connected with [name] on 05/17/18 at 1442 by telephone and verified that I am speaking with the correct person using two identifiers. [name] is currently located at home and daughter are currently with her during visit. The provider, Fransisca Kaufmann Dettinger, MD is located in their office at time of visit.  Call ended at 1452  I discussed the limitations, risks, security and privacy concerns of performing an evaluation and management service by telephone and the availability of in person appointments. I also discussed with the patient that there may be a patient responsible charge related to this service. The patient expressed understanding and agreed to proceed.   History and Present Illness: Patient has not been sleeping well. She has been having trouble falling asleep. She can't fall asleep until 12 am. She has not tried anything over the counter yet.  It has been worse over the past 3 or 4 months. She has the feeling of being stressed out more recently.   She says she wakes up after 3 or 4 hours and sometimes goes back to sleep and sometimes does not and she just feels like it started to wear on her that she is not sleeping enough.  She denies any suicidal ideations or thoughts of hurting her self.  She does have some anxiety but no major depression or moods that she complains of today.  She has been having recurrent utis and we have treated multiple times but they seem to come right back as if she stops antibiotic, there is concerned that something else might be going on and she started to get a little bit of urinary frequency again, will go ahead and do a urology referral.  No diagnosis found.  Outpatient Encounter Medications as of 05/17/2018  Medication Sig  . Acetaminophen (TYLENOL) 325 MG CAPS Take 650 mg by mouth every 6 (six) hours as needed for mild pain.  Marland Kitchen ARTIFICIAL TEAR OP Apply 2 drops to eye daily as needed (Dryness). To the left  eye  . atorvastatin (LIPITOR) 20 MG tablet Take 1 tablet (20 mg total) by mouth at bedtime.  . cephALEXin (KEFLEX) 500 MG capsule Take 1 capsule (500 mg total) by mouth 4 (four) times daily.  . digoxin (LANOXIN) 0.125 MG tablet Take 1 tablet (0.125 mg total) by mouth daily. (Patient taking differently: Take 0.125 mg by mouth every morning. )  . diltiazem (CARDIZEM CD) 180 MG 24 hr capsule Take 1 capsule (180 mg total) by mouth daily.  Marland Kitchen donepezil (ARICEPT) 5 MG tablet Take 1 tablet (5 mg total) by mouth at bedtime. FOR MEMORY  . edoxaban (SAVAYSA) 30 MG TABS tablet Take 1 tablet (30 mg total) by mouth daily.  . hydrALAZINE (APRESOLINE) 10 MG tablet Take 1 tablet (10 mg total) by mouth 2 (two) times daily.  Marland Kitchen levothyroxine (SYNTHROID, LEVOTHROID) 50 MCG tablet Take 1 tablet daily before breakfast (Patient taking differently: Take 50 mcg by mouth daily before breakfast. )  . metoprolol succinate (TOPROL-XL) 100 MG 24 hr tablet Take 1 tablet (100 mg total) by mouth daily. Take with or immediately following a meal. (Patient taking differently: Take 100 mg by mouth every morning. )  . Multiple Vitamin (MULTIVITAMIN WITH MINERALS) TABS tablet Take 1 tablet by mouth every morning.  . phenytoin (DILANTIN) 100 MG ER capsule Take 1 capsule (100 mg total) by mouth 3 (three) times daily.  . tamsulosin (FLOMAX) 0.4 MG CAPS capsule Take 1  capsule (0.4 mg total) by mouth daily.   No facility-administered encounter medications on file as of 05/17/2018.     Review of Systems  Constitutional: Negative for chills and fever.  Eyes: Negative for visual disturbance.  Respiratory: Negative for chest tightness and shortness of breath.   Cardiovascular: Negative for chest pain and leg swelling.  Genitourinary: Positive for dysuria and frequency. Negative for difficulty urinating and hematuria.  Musculoskeletal: Negative for back pain and gait problem.  Skin: Negative for rash.  Neurological: Negative for  light-headedness and headaches.  Psychiatric/Behavioral: Positive for sleep disturbance. Negative for agitation, behavioral problems, dysphoric mood, self-injury and suicidal ideas. The patient is nervous/anxious.   All other systems reviewed and are negative.   Observations/Objective: Patient sounds comfortable and in no acute distress  Assessment and Plan: Problem List Items Addressed This Visit      Other   Insomnia - Primary   Relevant Medications   Melatonin 5 MG TABS   traZODone (DESYREL) 50 MG tablet    Other Visit Diagnoses    Recurrent UTI       Relevant Orders   Ambulatory referral to Urology       Follow Up Instructions:  We will start melatonin and trazodone as needed and see how she does on these will do a referral to urology for her recurrent UTIs.    I discussed the assessment and treatment plan with the patient. The patient was provided an opportunity to ask questions and all were answered. The patient agreed with the plan and demonstrated an understanding of the instructions.   The patient was advised to call back or seek an in-person evaluation if the symptoms worsen or if the condition fails to improve as anticipated.  The above assessment and management plan was discussed with the patient. The patient verbalized understanding of and has agreed to the management plan. Patient is aware to call the clinic if symptoms persist or worsen. Patient is aware when to return to the clinic for a follow-up visit. Patient educated on when it is appropriate to go to the emergency department.    I provided 10 minutes of non-face-to-face time during this encounter.    Worthy Rancher, MD

## 2018-05-30 ENCOUNTER — Ambulatory Visit: Payer: Medicare Other | Admitting: Neurology

## 2018-05-31 ENCOUNTER — Telehealth: Payer: Self-pay | Admitting: Family Medicine

## 2018-05-31 NOTE — Telephone Encounter (Signed)
appt scheduled for televisit Pt's son notified

## 2018-06-02 ENCOUNTER — Other Ambulatory Visit: Payer: Self-pay

## 2018-06-02 ENCOUNTER — Encounter: Payer: Self-pay | Admitting: Family Medicine

## 2018-06-02 ENCOUNTER — Ambulatory Visit (INDEPENDENT_AMBULATORY_CARE_PROVIDER_SITE_OTHER): Payer: Medicare Other | Admitting: Family Medicine

## 2018-06-02 DIAGNOSIS — K580 Irritable bowel syndrome with diarrhea: Secondary | ICD-10-CM

## 2018-06-02 MED ORDER — RIFAXIMIN 550 MG PO TABS: 550.0000 mg | ORAL_TABLET | Freq: Three times a day (TID) | ORAL | 0 refills | Status: DC

## 2018-06-02 NOTE — Progress Notes (Signed)
Virtual Visit via telephone Note  I connected with [name] on 06/02/18 at 1604 by telephone and verified that I am speaking with the correct person using two identifiers. [name] is currently located at home and sister [name] are currently with her during visit. The provider, Fransisca Kaufmann Brantly Kalman, MD is located in their office at time of visit.  Call ended at 1617  I discussed the limitations, risks, security and privacy concerns of performing an evaluation and management service by telephone and the availability of in person appointments. I also discussed with the patient that there may be a patient responsible charge related to this service. The patient expressed understanding and agreed to proceed.   History and Present Illness: Patient has been having diarrhea early in the morning.  She has been taking imodium and having 3 diarrhea per day.  She is not having blood in stool but does have it when she wipes from hemorrhoids.  She doesn't have an appetite.  She denies any nausea or vomiting or fevers or chills.  No diagnosis found.  Outpatient Encounter Medications as of 06/02/2018  Medication Sig  . Acetaminophen (TYLENOL) 325 MG CAPS Take 650 mg by mouth every 6 (six) hours as needed for mild pain.  Marland Kitchen ARTIFICIAL TEAR OP Apply 2 drops to eye daily as needed (Dryness). To the left eye  . atorvastatin (LIPITOR) 20 MG tablet Take 1 tablet (20 mg total) by mouth at bedtime.  . cephALEXin (KEFLEX) 500 MG capsule Take 1 capsule (500 mg total) by mouth 4 (four) times daily.  . digoxin (LANOXIN) 0.125 MG tablet Take 1 tablet (0.125 mg total) by mouth daily. (Patient taking differently: Take 0.125 mg by mouth every morning. )  . diltiazem (CARDIZEM CD) 180 MG 24 hr capsule Take 1 capsule (180 mg total) by mouth daily.  Marland Kitchen donepezil (ARICEPT) 5 MG tablet Take 1 tablet (5 mg total) by mouth at bedtime. FOR MEMORY  . edoxaban (SAVAYSA) 30 MG TABS tablet Take 1 tablet (30 mg total) by mouth  daily.  . hydrALAZINE (APRESOLINE) 10 MG tablet Take 1 tablet (10 mg total) by mouth 2 (two) times daily.  Marland Kitchen levothyroxine (SYNTHROID, LEVOTHROID) 50 MCG tablet Take 1 tablet daily before breakfast (Patient taking differently: Take 50 mcg by mouth daily before breakfast. )  . Melatonin 5 MG TABS Take 1 tablet (5 mg total) by mouth at bedtime.  . metoprolol succinate (TOPROL-XL) 100 MG 24 hr tablet Take 1 tablet (100 mg total) by mouth daily. Take with or immediately following a meal. (Patient taking differently: Take 100 mg by mouth every morning. )  . Multiple Vitamin (MULTIVITAMIN WITH MINERALS) TABS tablet Take 1 tablet by mouth every morning.  . phenytoin (DILANTIN) 100 MG ER capsule Take 1 capsule (100 mg total) by mouth 3 (three) times daily.  . tamsulosin (FLOMAX) 0.4 MG CAPS capsule Take 1 capsule (0.4 mg total) by mouth daily.  . traZODone (DESYREL) 50 MG tablet Take 0.5-1 tablets (25-50 mg total) by mouth at bedtime as needed for sleep.   No facility-administered encounter medications on file as of 06/02/2018.     Review of Systems  Constitutional: Negative for chills and fever.  Eyes: Negative for visual disturbance.  Respiratory: Negative for chest tightness and shortness of breath.   Cardiovascular: Negative for chest pain and leg swelling.  Gastrointestinal: Positive for abdominal distention and diarrhea. Negative for abdominal pain, constipation, nausea and vomiting.  Musculoskeletal: Negative for back pain and  gait problem.  Skin: Negative for rash.  Neurological: Negative for light-headedness and headaches.  Psychiatric/Behavioral: Negative for agitation and behavioral problems.  All other systems reviewed and are negative.   Observations/Objective: Patient sounds comfortable and in no acute distress  Assessment and Plan: Problem List Items Addressed This Visit    None    Visit Diagnoses    Irritable bowel syndrome with diarrhea    -  Primary   Relevant Medications    rifaximin (XIFAXAN) 550 MG TABS tablet       Follow Up Instructions: Follow up as needed    I discussed the assessment and treatment plan with the patient. The patient was provided an opportunity to ask questions and all were answered. The patient agreed with the plan and demonstrated an understanding of the instructions.   The patient was advised to call back or seek an in-person evaluation if the symptoms worsen or if the condition fails to improve as anticipated.  The above assessment and management plan was discussed with the patient. The patient verbalized understanding of and has agreed to the management plan. Patient is aware to call the clinic if symptoms persist or worsen. Patient is aware when to return to the clinic for a follow-up visit. Patient educated on when it is appropriate to go to the emergency department.    I provided 13 minutes of non-face-to-face time during this encounter.    Worthy Rancher, MD

## 2018-06-05 ENCOUNTER — Telehealth: Payer: Self-pay | Admitting: Family Medicine

## 2018-06-05 ENCOUNTER — Telehealth: Payer: Self-pay | Admitting: Cardiology

## 2018-06-05 MED ORDER — ELUXADOLINE 75 MG PO TABS: 1.0000 | ORAL_TABLET | Freq: Two times a day (BID) | ORAL | 2 refills | Status: DC

## 2018-06-05 NOTE — Telephone Encounter (Signed)
Please let the patient know that I sent in [place] and it may also be expensive or may not be but they can see, it is to take twice a day and she can try for insurance coverage.  If this 1 is not covered then the options going forward are to continue to take the Imodium as needed as she has been doing or go see a gastroenterologist

## 2018-06-05 NOTE — Telephone Encounter (Signed)
Disregard opened in error °

## 2018-06-05 NOTE — Telephone Encounter (Signed)
Aware and verbalizes understanding.  

## 2018-06-08 ENCOUNTER — Other Ambulatory Visit: Payer: Self-pay | Admitting: Family Medicine

## 2018-06-08 ENCOUNTER — Ambulatory Visit: Payer: Self-pay

## 2018-06-08 ENCOUNTER — Other Ambulatory Visit: Payer: Self-pay

## 2018-06-08 NOTE — Telephone Encounter (Signed)
Patient already has loperamide over-the-counter, tell her to continue taking that because the other options are not cheap.

## 2018-06-08 NOTE — Telephone Encounter (Signed)
Patient aware and verbalizes understanding. 

## 2018-06-08 NOTE — Telephone Encounter (Signed)
Viberzi is not covered Preferred is Loperamide Please advise

## 2018-06-08 NOTE — Telephone Encounter (Signed)
[name] is calling about about this issue again please call back

## 2018-06-08 NOTE — Patient Outreach (Signed)
[facility]) Care Management  06/08/2018  [name] 07/18/1932 [id number]  Called member at preferred number and member answered the phone. HIPPA identifiers verified. Also called member's son [name] and HIPPA identities verified.   Member states she is doing well and states her sister [name] is home with her. Member states her Private Aide will come today.  Member stated "my nerves are bad" and stated "I have an appointment with Dr. Tammi Rogers tomorrow". Ray confirmed member's appointment with Urology tomorrow, an appointment with Dr. Delice Rogers, Neurology on 06-30-2018, and a Virtual appointment with Dr. Percival Rogers on 06-14-2018.   Member states she has enough food and has been eating daily, denies seizure activity, and denies signs or symptoms of stroke. Assessed member's knowledge of stroke and member states slurred speech. Educated member and Ray about FAST. Asked member what to do if signs and symptoms of stroke and member stated "call 911". Member states she has a life line necklace.   Ray states that he provides food along with friends and caregiver for member and states that caregiver provides transportation to appointments.    THN CM Care Plan Problem One     Most Recent Value  Care Plan Problem One  Risk for hospital readmission as evidenced by recent admission  Role Documenting the Problem One  Care Management [place] for Problem One  Active  THN Long Term Goal   Member will not be readmitted for next 31 days  THN Long Term Goal Start Date  04/06/18  [facility] Long Term Goal Met Date  06/08/18  [facility] CM Short Term Goal #1   Risk for injury related to history of and recent hospitalization for seizures  THN CM Short Term Goal #1 Start Date  04/06/18  Interventions for Short Term Goal #1  Assessed member for any recent seizure activities,  Educated member on when to call 49  [facility] CM Short Term Goal #2   High risk for falls related to PMH: Dementia as evidenced by  member stating "I don't know" to questions about health history  THN CM Short Term Goal #2 Start Date  04/06/18  Interventions for Short Term Goal #2  Provided education to member regarding to call for assitance when needed    [facility] CM Care Plan Problem Two     Most Recent Value  Care Plan Problem Two  "Knowledge Deficits related to COVID-19 and impact on patient self-Health management  Role Documenting the Problem Two  Care Management [place] for Problem Two  Active  Interventions for Problem Two Long Term Goal   Assessed member's continued compliance with COVID-19 orders  THN Long Term Goal  Over next 31 days member will verbalize measures to prevent exposure to COVID-19  [facility] Long Term Goal Start Date  04/17/18  THN CM Short Term Goal #1   "Over the next 30 days, patient will verbalize basic understanding of COVID-19 impact on individual health and self-health management as evidenced by verbalization of basic understanding of COVID-19 as a viral disease  THN CM Short Term Goal #1 Start Date  04/17/18  Interventions for Short Term Goal #2   Assessed member for compliance with social distancing    [facility] CM Care Plan Problem Three     Most Recent Value  Care Plan Problem Three  Risk for falls as manifested by age and need for ambulatory aides  Role Documenting the Problem Three  Care Management Coordinator  Care Plan for  Problem Three  Active  THN Long Term Goal   Member will decrease risk of falls for next 90 days as manifested by verbal report  THN Long Term Goal Start Date  05/08/18  [facility] Long Term Goal Met Date  06/08/18  [facility] CM Short Term Goal #1   Member will call Provider of 911 as needed  [place] CM Short Term Goal #1 Start Date  05/08/18  Interventions for Short Term Goal #1  assessed member for any care needs     Plan: [place] referral as member's son [name] says member has an order from Dr. Warrick Rogers for Irritable bowel syndrome medication; however, family cannot  afford medication. Sent [facility] SW referral as member states incontinence episodes and requests briefs. Will follow up with member/family next month.  [name], RN-BSN  [facility] Care Management  Community Care Management Coordinator  [phone] [email or web address]

## 2018-06-09 ENCOUNTER — Telehealth: Payer: Self-pay | Admitting: Family Medicine

## 2018-06-09 ENCOUNTER — Telehealth: Payer: Self-pay | Admitting: Pharmacist

## 2018-06-09 DIAGNOSIS — N281 Cyst of kidney, acquired: Secondary | ICD-10-CM | POA: Diagnosis not present

## 2018-06-09 DIAGNOSIS — R3915 Urgency of urination: Secondary | ICD-10-CM | POA: Diagnosis not present

## 2018-06-09 MED ORDER — LOPERAMIDE HCL 2 MG PO TABS: 2.0000 mg | ORAL_TABLET | Freq: Four times a day (QID) | ORAL | 3 refills | Status: DC | PRN

## 2018-06-09 NOTE — Telephone Encounter (Signed)
Pt's son notified of RX

## 2018-06-09 NOTE — Telephone Encounter (Signed)
Insurance will cover RX if script is sent in Can RX be sent in

## 2018-06-09 NOTE — Telephone Encounter (Signed)
I called in Imodium for the patient.

## 2018-06-11 NOTE — Patient Outreach (Signed)
[place] [facility]) Care Management  06/11/2018  [name] 1932/07/28 [id number]    LATE ENTRY FOR 06/09/2018    Patient's son ([name]) was called to follow up on a referral to reach out about medication affordability with Viberzi.  HIPAA identifiers were obtained. Patient's son said [name] was going to cost $500. He could not review the patient's medications over the phone and suggested that I call the patient's caregiver.   From chart review:  patient is an 85 year old female with multiple medical conditions including but not limited to:  Seizure disorder, osteoporosis, hypothyroidism, hypertension, overactive bladder,  Afib, CHF, and history of CVA.    Patient and caregiver were called.  Unfortunately, no one answered the phone. HIPAA compliant message was left on the voicemail.  Patient's son wondered if there were any programs or if a prior authorization could be done.  Dr. Merita Norton office called in Imodium (OTC).  Plan:  Patient will be sent a medication assistance application for Viberzi Call patient's caregiver back for updated medication review.  Elayne Guerin, PharmD, [place] Clinical Pharmacist [phone]

## 2018-06-12 ENCOUNTER — Other Ambulatory Visit: Payer: Self-pay

## 2018-06-12 ENCOUNTER — Telehealth: Payer: Self-pay | Admitting: *Deleted

## 2018-06-12 NOTE — Patient Outreach (Signed)
[facility]) Care Management  06/12/2018  [name] 01/21/1932 [id number]   Successful outreach to patient regarding social work referral for resources to assist with purchase of incontinence supplies.  Unfortunately, BSW and social work colleagues are not aware of resources for purchase of these supplies.  BSW informed patient that [name] typically sales these products at a lower price than local stores.  BSW also educated her about [place] that may be aware of resources.  Patient was referred to [facility] in July of last year at which time a Medicaid application was mailed to her.  Patient reports that this application was not submitted.  BSW offered to mail application because Medicaid does cover some supplies and services that Medicare does not.   Patient requested that BSW speak with her caregiver about the above information.  Caregiver reported that she can assist patient with completing and submitting application.  BSW will follow up within the next two weeks to ensure receipt.  Ronn Melena, BSW Social Worker [phone]

## 2018-06-12 NOTE — Telephone Encounter (Signed)

## 2018-06-13 ENCOUNTER — Other Ambulatory Visit: Payer: Self-pay | Admitting: Pharmacy Technician

## 2018-06-13 NOTE — Progress Notes (Signed)
Virtual Visit via Telephone Note   This visit type was conducted due to national recommendations for restrictions regarding the COVID-19 Pandemic (e.g. social distancing) in an effort to limit this patient's exposure and mitigate transmission in our community.  Due to her co-morbid illnesses, this patient is at least at moderate risk for complications without adequate follow up.  This format is felt to be most appropriate for this patient at this time.  All issues noted in this document were discussed and addressed.  A limited physical exam was performed with this format.  Please refer to the patient's chart for her consent to telehealth for [facility].   Date:  06/14/2018   ID:  [name], DOB 1932/01/15, MRN [id number]  Patient Location: Home Provider Location: Home  PCP:  Dettinger, Fransisca Kaufmann, MD  Cardiologist:  Minus Breeding, MD  Electrophysiologist:  None   Evaluation Performed:  Follow-Up Visit  Chief Complaint:  Atrial fib  History of Present Illness:    [name] is a 85 y.o. female who presents for follow up of atrial fib.  Since I last saw her she has done well.  The patient denies any new symptoms such as chest discomfort, neck or arm discomfort. There has been no new shortness of breath, PND or orthopnea. There have been no reported palpitations, presyncope or syncope.  She has had none of the activity that was thought to be a prior seizure or TIA.    The patient does not have symptoms concerning for COVID-19 infection (fever, chills, cough, or new shortness of breath).    Past Medical History:  Diagnosis Date  . Anemia   . Atrial fibrillation (HCC)    Paroxysmal, Flecainide therapy  . Calf pain    September, 2012, at rest  . Carotid bruit    Doppler, December, 2009, no abnormality  . Cataract   . Diverticulosis   . GERD (gastroesophageal reflux disease)   . History of shingles   . HLD (hyperlipidemia)   . HTN (hypertension)   . Hypothyroidism   .  Insomnia   . Osteoarthritis   . PONV (postoperative nausea and vomiting)   . Rectal bleeding 2001   diverticulosis and int hemorrhoids on 07/1999 and 02/2010 colonoscopies.  . Seizures ([place])   . Stroke ([place])   . Warfarin anticoagulation    Past Surgical History:  Procedure Laterality Date  . BIOPSY  09/29/2015   Procedure: BIOPSY;  Surgeon: Rogene Houston, MD;  [facility];  Service: Endoscopy;;  gastric  . CATARACT EXTRACTION W/PHACO Right 04/15/2014   Procedure: CATARACT EXTRACTION PHACO AND INTRAOCULAR LENS PLACEMENT (IOC);  Surgeon: Tonny Branch, MD;  Location: [facility] ORS;  Service: Ophthalmology;  Laterality: Right;  CDE: 13.30  . CATARACT EXTRACTION W/PHACO Left 05/13/2014   Procedure: CATARACT EXTRACTION PHACO AND INTRAOCULAR LENS PLACEMENT LEFT EYE;  Surgeon: Tonny Branch, MD;  Location: [facility];  Service: Ophthalmology;  Laterality: Left;  CDE:13.00  . CHOLECYSTECTOMY N/A 01/15/2015   Procedure: LAPAROSCOPIC CHOLECYSTECTOMY;  Surgeon: Ralene Ok, MD;  Location: [place];  Service: General;  Laterality: N/A;  . COLONOSCOPY N/A 09/29/2015   Procedure: COLONOSCOPY;  Surgeon: Rogene Houston, MD;  [facility];  Service: Endoscopy;  Laterality: N/A;  . COLONOSCOPY    . ESOPHAGOGASTRODUODENOSCOPY N/A 01/13/2015   Procedure: ESOPHAGOGASTRODUODENOSCOPY (EGD);  Surgeon: Jerene Bears, MD;  Location: [facility];  Service: Endoscopy;  Laterality: N/A;  . ESOPHAGOGASTRODUODENOSCOPY N/A 09/29/2015   Procedure: ESOPHAGOGASTRODUODENOSCOPY (EGD);  Surgeon: Rogene Houston, MD;  [facility];  Service: Endoscopy;  Laterality: N/A;  12:15  . GALLBLADDER SURGERY    . LEFT HEART CATH AND CORONARY ANGIOGRAPHY N/A 06/22/2016   Procedure: Left Heart Cath and Coronary Angiography;  Surgeon: Troy Sine, MD;  Location: [place] CV LAB;  Service: Cardiovascular;  Laterality: N/A;  . SHOULDER OPEN ROTATOR CUFF REPAIR     bilateral  . TOTAL KNEE ARTHROPLASTY  05/26/10   right  .  TOTAL KNEE ARTHROPLASTY  11/17/2010   Procedure: TOTAL KNEE ARTHROPLASTY;  Surgeon: Mauri Pole;  Location: [facility];  Service: Orthopedics;  Laterality: Left;  . TOTAL KNEE ARTHROPLASTY     Right  . TOTAL SHOULDER REPLACEMENT  01/2010   left     Current Meds  Medication Sig  . Acetaminophen (TYLENOL) 325 MG CAPS Take 650 mg by mouth every 6 (six) hours as needed for mild pain.  Marland Kitchen atorvastatin (LIPITOR) 20 MG tablet Take 1 tablet (20 mg total) by mouth at bedtime.  . digoxin (LANOXIN) 0.125 MG tablet Take 1 tablet (0.125 mg total) by mouth daily. (Patient taking differently: Take 0.125 mg by mouth every morning. )  . diltiazem (CARDIZEM CD) 180 MG 24 hr capsule Take 1 capsule (180 mg total) by mouth daily.  Marland Kitchen donepezil (ARICEPT) 5 MG tablet TAKE ONE TABLET AT BEDTIME FOR MEMORY  . edoxaban (SAVAYSA) 30 MG TABS tablet Take 1 tablet (30 mg total) by mouth daily.  . hydrALAZINE (APRESOLINE) 10 MG tablet Take 1 tablet (10 mg total) by mouth 2 (two) times daily.  Marland Kitchen levothyroxine (SYNTHROID, LEVOTHROID) 50 MCG tablet Take 1 tablet daily before breakfast (Patient taking differently: Take 50 mcg by mouth daily before breakfast. )  . loperamide (LOPERAMIDE A-D) 2 MG tablet Take 1 tablet (2 mg total) by mouth 4 (four) times daily as needed for diarrhea or loose stools.  . Melatonin 5 MG TABS Take 1 tablet (5 mg total) by mouth at bedtime.  . metoprolol succinate (TOPROL-XL) 100 MG 24 hr tablet Take 1 tablet (100 mg total) by mouth daily. Take with or immediately following a meal. (Patient taking differently: Take 100 mg by mouth every morning. )  . Multiple Vitamin (MULTIVITAMIN WITH MINERALS) TABS tablet Take 1 tablet by mouth every morning.  . phenytoin (DILANTIN) 100 MG ER capsule Take 1 capsule (100 mg total) by mouth 3 (three) times daily.  . tamsulosin (FLOMAX) 0.4 MG CAPS capsule Take 1 capsule (0.4 mg total) by mouth daily.  . traZODone (DESYREL) 50 MG tablet Take 0.5-1 tablets (25-50 mg total)  by mouth at bedtime as needed for sleep.     Allergies:   Penicillins   Social History   Tobacco Use  . Smoking status: Never Smoker  . Smokeless tobacco: Never Used  Substance Use Topics  . Alcohol use: No  . Drug use: No     Family Hx: The patient's family history includes Arthritis in her mother; Atrial fibrillation in her brother; COPD in her brother; Colon cancer in her brother; Coronary artery disease in her brother; Diabetes in her brother; Early death in her father; Gallbladder disease in her brother; Heart attack in her daughter and father; Heart disease in her father and mother; Liver cancer in her brother; Rheum arthritis in her mother.  ROS:   Please see the history of present illness.    As stated in the HPI and negative for all other systems.   Prior CV  studies:   The following studies were reviewed today:    Labs/Other Tests and Data Reviewed:    EKG:  No ECG reviewed.  Recent Labs: 03/01/2018: ALT 15 03/02/2018: Magnesium 1.9 03/03/2018: TSH 7.189 03/06/2018: Hemoglobin 12.6; Platelets 283 03/07/2018: BUN <5; Creatinine, Ser 0.48; Potassium 3.4; Sodium 133   Recent Lipid Panel Lab Results  Component Value Date/Time   CHOL 144 01/31/2018 05:57 AM   TRIG 52 01/31/2018 05:57 AM   HDL 70 01/31/2018 05:57 AM   CHOLHDL 2.1 01/31/2018 05:57 AM   LDLCALC 64 01/31/2018 05:57 AM   LDLDIRECT 67 02/01/2017 11:20 AM    Wt Readings from Last 3 Encounters:  06/14/18 120 lb (54.4 kg)  04/06/18 127 lb (57.6 kg)  04/04/18 124 lb (56.2 kg)     Objective:    Vital Signs:  BP (!) 69/47   Pulse (!) 56   Ht 5\' 2"  (1.575 m)   Wt 120 lb (54.4 kg)   BMI 21.95 kg/m    VITAL SIGNS:  reviewed  ASSESSMENT & PLAN:    ATRIAL FIB:   [name] has a CHA2DS2 - VASc score of 6.   The patient  Tolerates anticoagulation. We will continue with the meds as listed.  She has had no new symptomatic paroxysms.  HTN: Blood pressures well controlled.  No change in therapy.   COVID-19 Education: The signs and symptoms of COVID-19 were discussed with the patient and how to seek care for testing (follow up with PCP or arrange E-visit).  The importance of social distancing was discussed today.  Time:   Today, I have spent 16 minutes with the patient with telehealth technology discussing the above problems.     Medication Adjustments/Labs and Tests Ordered: Current medicines are reviewed at length with the patient today.  Concerns regarding medicines are outlined above.   Tests Ordered: No orders of the defined types were placed in this encounter.   Medication Changes: No orders of the defined types were placed in this encounter.   Disposition:  Follow up me in one year  Signed, Minus Breeding, MD  06/14/2018 2:21 PM    [facility]

## 2018-06-13 NOTE — Patient Outreach (Signed)
[name]) Care Management  06/13/2018  [name] [birth year] [id number]                           Medication Assistance Referral  Referral From: [place]  Medication/Company: [name] Patient application portion:  Mailed Provider application portion: Faxed  to Dr. Warrick Parisian    Follow up:  Will follow up with patient in 5-10 business days to confirm application(s) have been received.  [name] P. [name], [place] Management [phone]

## 2018-06-14 ENCOUNTER — Other Ambulatory Visit: Payer: Self-pay

## 2018-06-14 ENCOUNTER — Telehealth (INDEPENDENT_AMBULATORY_CARE_PROVIDER_SITE_OTHER): Payer: Medicare Other | Admitting: Cardiology

## 2018-06-14 ENCOUNTER — Encounter: Payer: Self-pay | Admitting: Cardiology

## 2018-06-14 VITALS — BP 145/79 | HR 71 | Ht 62.0 in | Wt 120.0 lb

## 2018-06-14 DIAGNOSIS — I10 Essential (primary) hypertension: Secondary | ICD-10-CM

## 2018-06-14 DIAGNOSIS — I480 Paroxysmal atrial fibrillation: Secondary | ICD-10-CM

## 2018-06-14 DIAGNOSIS — Z7189 Other specified counseling: Secondary | ICD-10-CM | POA: Insufficient documentation

## 2018-06-14 NOTE — Patient Instructions (Signed)
Medication Instructions:  The current medical regimen is effective;  continue present plan and medications.  If you need a refill on your cardiac medications before your next appointment, please call your pharmacy.   Follow-Up: Follow up in 1 year with Dr. Percival Spanish In [place].  You will receive a letter in the mail 2 months before you are due.  Please call us when you receive this letter to schedule your follow up appointment.  Thank you for choosing [facility]!!

## 2018-06-15 ENCOUNTER — Other Ambulatory Visit: Payer: Self-pay | Admitting: Pharmacist

## 2018-06-15 ENCOUNTER — Ambulatory Visit: Payer: Self-pay | Admitting: Pharmacist

## 2018-06-15 NOTE — Patient Outreach (Signed)
[place] [facility]) Care Management  06/15/2018  RHEGAN TRUNNELL 11/30/1932 [id number]   Patient was called to complete a medication review. A woman answered the phone who identified herself as the patient's sister. A HIPAA compliant message was left. She said she would have the patient call me back once she got up for the day.   Patient has already been mailed a patient assistance application for Viberzi.  Plan: Send an unsuccessful contact letter Call patient back in 5-7 business days.   Elayne Guerin, PharmD, [place] Clinical Pharmacist [phone]

## 2018-06-19 ENCOUNTER — Telehealth: Payer: Self-pay | Admitting: Family Medicine

## 2018-06-19 NOTE — Telephone Encounter (Signed)
Aware and verbalizes understanding.  

## 2018-06-19 NOTE — Telephone Encounter (Signed)
Returned son's call - states that the viberzi was too expensive so they tried OTC loperamla which is working. Wanting to know if they can just continue OTC? Please advise

## 2018-06-19 NOTE — Telephone Encounter (Signed)
Yes ok to continue otc loperamide

## 2018-06-19 NOTE — Telephone Encounter (Signed)
PT son was on hold trying to get a nurse pt son got disconnected, pt son is wanting to talk to nurse about a medication that was to expensive and wanting to discuss using OTC

## 2018-06-21 ENCOUNTER — Other Ambulatory Visit: Payer: Self-pay

## 2018-06-21 NOTE — Patient Outreach (Signed)
[place] [facility]) Care Management  06/21/2018  [name] Jul 07, 1932 [id number]   Follow up call to patient who confirmed receipt of Medicaid application.  Caregiver, [name], reports that she can assist patient with completion. BSW is closing case at this time but encouraged her to call if additional needs or questions arise.  Ronn Melena, BSW Social Worker [phone]

## 2018-06-23 ENCOUNTER — Other Ambulatory Visit: Payer: Self-pay | Admitting: Pharmacist

## 2018-06-23 NOTE — Patient Outreach (Signed)
[facility]) Care Management  [place]   06/23/2018  ASPIN Rogers March 17, 1932 [id number]  Reason for referral: Medication Review  Referral source: [facility] RN Current insurance: [facility]  PMHx includes but not limited to:   Seizure disorder, osteoporosis, hypothyroidism, hypertension, overactive bladder,  Afib, CHF, and history of CVA.    Outreach:  Successful telephone call with Dietitian.  HIPAA identifiers verified.   Subjective:  Patient reports using pre-filled pill packs from [facility] as an adherence strategy.  Does the patient ever forget to take medication?  no Does the patient have problems obtaining medications due to transportation?   no Does the patient have problems obtaining medications due to cost?  yes  Does the patient feel that medications prescribed are effective?  yes Does the patient ever experience any side effects to the medications prescribed?  no   Objective: The ASCVD Risk score [name] DC Jr., et al., 2013) failed to calculate for the following reasons:   The 2013 ASCVD risk score is only valid for ages 5 to 46   The patient has a prior MI or stroke diagnosis  Lab Results  Component Value Date   CREATININE 0.48 03/07/2018   CREATININE 0.39 (L) 03/06/2018   CREATININE 0.64 03/05/2018    Lab Results  Component Value Date   HGBA1C 5.9 (H) 01/31/2018    Lipid Panel     Component Value Date/Time   CHOL 144 01/31/2018 0557   TRIG 52 01/31/2018 0557   HDL 70 01/31/2018 0557   CHOLHDL 2.1 01/31/2018 0557   VLDL 10 01/31/2018 0557   LDLCALC 64 01/31/2018 0557   LDLDIRECT 67 02/01/2017 1120    BP Readings from Last 3 Encounters:  06/14/18 (!) 145/79  04/06/18 (!) 143/73  04/04/18 118/60    Allergies  Allergen Reactions  . Penicillins Rash and Other (See Comments)    Has patient had a PCN reaction causing immediate rash, facial/tongue/throat swelling, SOB or lightheadedness  with hypotension: No Has patient had a PCN reaction causing severe rash involving mucus membranes or skin necrosis: No Has patient had a PCN reaction that required hospitalization No Has patient had a PCN reaction occurring within the last 10 years: No If all of the above answers are "NO", then may proceed with Cephalosporin use.    Medications Reviewed Today    Reviewed by Terri Breeding, MD (Physician) on 06/14/18 at 1508  Med List Status: <None>  Medication Order Taking? Sig Documenting Provider Last Dose Status Informant  Acetaminophen (TYLENOL) 325 MG CAPS [id number] Yes Take 650 mg by mouth every 6 (six) hours as needed for mild pain. [provider] Taking Active Pharmacy Records  ARTIFICIAL TEAR OP [id number]  Apply 2 drops to eye daily as needed ([place]ryness). To the left eye [provider]  Active Pharmacy Records  atorvastatin (LIPITOR) 20 MG tablet [id number] Yes Take 1 tablet (20 mg total) by mouth at bedtime. Rogers, Terri Kaufmann, MD Taking Active Pharmacy Records  digoxin (LANOXIN) 0.125 MG tablet [id number] Yes Take 1 tablet (0.125 mg total) by mouth daily.  Patient taking differently: Take 0.125 mg by mouth every morning.    Terri Breeding, MD Taking Active Pharmacy Records  diltiazem [facility] CD) 180 MG 24 hr capsule [id number] Yes Take 1 capsule (180 mg total) by mouth daily. Rogers, Terri Kaufmann, MD Taking Active   donepezil (ARICEPT) 5 MG tablet [id number] Yes TAKE ONE TABLET AT BEDTIME FOR MEMORY Rogers, Terri Kaufmann, MD  Taking Active   edoxaban (SAVAYSA) 30 MG TABS tablet [id number] Yes Take 1 tablet (30 mg total) by mouth daily. Terri Serge, NP Taking Active   Eluxadoline (VIBERZI) 75 MG TABS [id number] No Take 1 tablet by mouth 2 (two) times daily. Rogers, Terri Kaufmann, MD Unknown Active   hydrALAZINE (APRESOLINE) 10 MG tablet [id number] Yes Take 1 tablet (10 mg total) by mouth 2 (two) times daily. Rogers, Terri Kaufmann, MD Taking Active   levothyroxine  (SYNTHROID, LEVOTHROID) 50 MCG tablet [id number] Yes Take 1 tablet daily before breakfast  Patient taking differently: Take 50 mcg by mouth daily before breakfast.    Rogers, Terri Kaufmann, MD Taking Active Pharmacy Records  loperamide (LOPERAMIDE A-D) 2 MG tablet [id number] Yes Take 1 tablet (2 mg total) by mouth 4 (four) times daily as needed for diarrhea or loose stools. Rogers, Terri Kaufmann, MD Taking Active   Melatonin 5 MG TABS [id number] Yes Take 1 tablet (5 mg total) by mouth at bedtime. Rogers, Terri Kaufmann, MD Taking Active   metoprolol succinate (TOPROL-XL) 100 MG 24 hr tablet [id number] Yes Take 1 tablet (100 mg total) by mouth daily. Take with or immediately following a meal.  Patient taking differently: Take 100 mg by mouth every morning.    Rogers, Terri Kaufmann, MD Taking Active Pharmacy Records  Multiple Vitamin (MULTIVITAMIN WITH MINERALS) TABS tablet [id number] Yes Take 1 tablet by mouth every morning. [provider] Taking Active Pharmacy Records  phenytoin (DILANTIN) 100 MG ER capsule [id number] Yes Take 1 capsule (100 mg total) by mouth 3 (three) times daily. Rogers, Terri Kaufmann, MD Taking Active   rifaximin (XIFAXAN) 550 MG TABS tablet [id number] No Take 1 tablet (550 mg total) by mouth 3 (three) times daily. Rogers, Terri Kaufmann, MD Unknown Active   tamsulosin (FLOMAX) 0.4 MG CAPS capsule [id number] Yes Take 1 capsule (0.4 mg total) by mouth daily. Rogers, Terri Kaufmann, MD Taking Active   traZODone (DESYREL) 50 MG tablet [id number] Yes Take 0.5-1 tablets (25-50 mg total) by mouth at bedtime as needed for sleep. Rogers, Terri Kaufmann, MD Taking Active           Assessment: Drugs sorted by system:  Neurologic/Psychologic: Phenytoin, Donepezil, Trazodone,   Hematologic:   Cardiovascular:  Atorvastatin, Diltiazem, Savaysa, Meotprolol, Xifaxan  Gastrointestinal: Loperamide, Viberzi (not taking right due to cost)  Endocrine: Levothyroxine,    Vitamins/Minerals/Supplements: Melatonin, Multiple Vitamin,   Miscellaneous: Artificial tears,   Patient Assistance Programs:  Application sent for Viberzi 06/13/2018  Spoke with patient's son. He said the OTC Imodium is working for his mother so he is not going to fill out the forms at this time. However, if she continues to have symptoms he will complete the forms and send them in.   Plan: . Will route note to [place] Simcox to alert about case closure..  . Will close [facility] pharmacy case as no further medication needs identified at this time.  Am happy to assist in the future as needed.

## 2018-06-30 ENCOUNTER — Ambulatory Visit (INDEPENDENT_AMBULATORY_CARE_PROVIDER_SITE_OTHER): Payer: Medicare Other | Admitting: Neurology

## 2018-06-30 ENCOUNTER — Encounter: Payer: Self-pay | Admitting: Neurology

## 2018-06-30 ENCOUNTER — Other Ambulatory Visit: Payer: Self-pay

## 2018-06-30 ENCOUNTER — Telehealth: Payer: Self-pay | Admitting: Cardiology

## 2018-06-30 VITALS — BP 176/76 | HR 93 | Temp 98.0°F | Ht 62.0 in | Wt 125.0 lb

## 2018-06-30 DIAGNOSIS — F0390 Unspecified dementia without behavioral disturbance: Secondary | ICD-10-CM | POA: Diagnosis not present

## 2018-06-30 DIAGNOSIS — G40009 Localization-related (focal) (partial) idiopathic epilepsy and epileptic syndromes with seizures of localized onset, not intractable, without status epilepticus: Secondary | ICD-10-CM | POA: Diagnosis not present

## 2018-06-30 DIAGNOSIS — F03A0 Unspecified dementia, mild, without behavioral disturbance, psychotic disturbance, mood disturbance, and anxiety: Secondary | ICD-10-CM

## 2018-06-30 MED ORDER — DONEPEZIL HCL 5 MG PO TABS: ORAL_TABLET | ORAL | 3 refills | Status: DC

## 2018-06-30 MED ORDER — PHENYTOIN SODIUM EXTENDED 100 MG PO CAPS: 100.0000 mg | ORAL_CAPSULE | Freq: Three times a day (TID) | ORAL | 3 refills | Status: DC

## 2018-06-30 NOTE — Patient Instructions (Signed)
Great meeting you! Continue all your medications. Follow-up in 6 months, call for any changes  Seizure Precautions: 1. If medication has been prescribed for you to prevent seizures, take it exactly as directed.  Do not stop taking the medicine without talking to your doctor first, even if you have not had a seizure in a long time.   2. Avoid activities in which a seizure would cause danger to yourself or to others.  Don't operate dangerous machinery, swim alone, or climb in high or dangerous places, such as on ladders, roofs, or girders.  Do not drive unless your doctor says you may.  3. If you have any warning that you may have a seizure, lay down in a safe place where you can't hurt yourself.    4.  No driving for 6 months from last seizure, as per [facility].   Please refer to the following link on the [place] website for more information: http://[email or web address]/[email or web address]/[email or web address]   5.  Maintain good sleep hygiene. Avoid alcohol.   6.  Contact your doctor if you have any problems that may be related to the medicine you are taking.  7.  Call 911 and bring the patient back to the ED if:        A.  The seizure lasts longer than 5 minutes.       B.  The patient doesn't awaken shortly after the seizure  C.  The patient has new problems such as difficulty seeing, speaking or moving  D.  The patient was injured during the seizure  E.  The patient has a temperature over 102 F (39C)  F.  The patient vomited and now is having trouble breathing        FALL PRECAUTIONS: Be cautious when walking. Scan the area for obstacles that may increase the risk of trips and falls. When getting up in the mornings, sit up at the edge of the bed for a few minutes before getting out of bed. Consider elevating the bed at the head end to avoid drop of blood pressure when getting up. Walk always in a well-lit room (use night lights in the walls).  Avoid area rugs or power cords from appliances in the middle of the walkways. Use a walker or a cane if necessary and consider physical therapy for balance exercise. Get your eyesight checked regularly.  HOME SAFETY: Consider the safety of the kitchen when operating appliances like stoves, microwave oven, and blender. Consider having supervision and share cooking responsibilities until no longer able to participate in those. Accidents with firearms and other hazards in the house should be identified and addressed as well.  ABILITY TO BE LEFT ALONE: If patient is unable to contact 911 operator, consider using LifeLine, or when the need is there, arrange for someone to stay with patients. Smoking is a fire hazard, consider supervision or cessation. Risk of wandering should be assessed by caregiver and if detected at any point, supervision and safe proof recommendations should be instituted.   RECOMMENDATIONS FOR ALL PATIENTS WITH MEMORY PROBLEMS: 1. Continue to exercise (Recommend 30 minutes of walking everyday, or 3 hours every week) 2. Increase social interactions - continue going to [place] and enjoy social gatherings with friends and family 3. Eat healthy, avoid fried foods and eat more fruits and vegetables 4. Maintain adequate blood pressure, blood sugar, and blood cholesterol level. Reducing the risk of stroke and cardiovascular disease also helps promoting better memory. 5. Avoid stressful  situations. Live a simple life and avoid aggravations. Organize your time and prepare for the next day in anticipation. 6. Sleep well, avoid any interruptions of sleep and avoid any distractions in the bedroom that may interfere with adequate sleep quality 7. Avoid sugar, avoid sweets as there is a strong link between excessive sugar intake, diabetes, and cognitive impairment The Mediterranean diet has been shown to help patients reduce the risk of progressive memory disorders and reduces cardiovascular risk.  This includes eating fish, eat fruits and green leafy vegetables, nuts like almonds and hazelnuts, walnuts, and also use olive oil. Avoid fast foods and fried foods as much as possible. Avoid sweets and sugar as sugar use has been linked to worsening of memory function.  There is always a concern of gradual progression of memory problems. If this is the case, then we may need to adjust level of care according to patient needs. Support, both to the patient and caregiver, should then be put into place.

## 2018-06-30 NOTE — Telephone Encounter (Signed)
Returned call to patient's husband. BP was elevated at Dr. Amparo Bristol office. Husband not sure if white coat syndrome as this was an isolated high reading. Patient is not routinely checking BP at home. Suggested that patient monitor BP at home twice daily for 7-10 days and call or send readings via MyChart message for MD to review. Informed him that BP at 6/10 telemedicine visit was 145 and no changes were recommended at that time.   Patient is compliant with medications but is unsure when she takes certain pills as they are sent in a weekly blister pack from pharmacy  Sent to MD and RN as Juluis Rainier

## 2018-06-30 NOTE — Progress Notes (Signed)
NEUROLOGY CONSULTATION NOTE  VANTASIA PINKNEY MRN: [id number] DOB: 01-17-1932  Referring provider: Dr. Niel Hummer Primary care provider: Dr. Vonna Kotyk Dettinger  Reason for consult:  seizure  Dear Dr Tyrell Antonio:  Thank you for your kind referral of [name] for consultation of the above symptoms. Although her history is well known to you, please allow me to reiterate it for the purpose of our medical record. The patient was accompanied to the clinic by her son [name] who also provides collateral information. Records and images were personally reviewed where available.  HISTORY OF PRESENT ILLNESS: This is an 85 year old right-handed woman with a history of hypertension, atrial fibrillation on chronic anticoagulation with Eliquis, CHF, presenting for evaluation of seizure. She has had recurrent episodes of confusion since 2017, EEGs in the past were unrevealing. Her son had witnessed staring spells lasting a few minutes. She had another confusional episode in January 2020 and was noted to have aphasia. MRI brain did not show any acute changes, there was severe chronic microvascular disease and old small cerebellar infarcts, marked global atrophy. EEG unremarkable. She had marked improvement in symptoms and was discharged home on Levetiracetam 500mg  BID. She was back in the hospital in February 2020 for left-sided weakness, EEG showed right hemisphere slowing. There is note of limb jerking. Levetiracetam increased to 750mg  BID. She was very drowsy on the LEV and was switched to Phenytoin, with improvement in sedation. She has been taking Phenytoin 100mg  TID without side effects. Due to interaction with Eliquis, she was switched to a different anticoagulant Edoxaban. She has been living with her sister since hospital discharge in February who now manages her medications. They deny any further seizures since February 2020. He and her sister have not seen any further staring spells. She denies any  olfactory/gustatory hallucinations, deja vu, rising epigastric sensation, focal numbness/tingling/weakness, myoclonic jerks. When asked about her memory, she states "I don't know, is that good or not." Her son reports memory changes started several years ago, worsening each year. She remembers things like her birthday, but she cannot keep up with dates and gets confused with her TV remote control and phone. She was started on Donepezil in February and he feels it has helped her. She stopped driving a year ago. Her son has managed finances since her husband passed away a year ago. She is independent with dressing and bathing. A sitter comes 3 days a week to help. Son denies any hallucinations or paranoia. She states that sometimes she "can't clear things out of my mind, I'm not worried, just a whole lot been happening." She reports sleep difficulties, "something to take the edge off." No wandering behavior.   Epilepsy Risk Factors:  Her brother and sister had seizures. Otherwise she had a normal birth and early development.  There is no history of febrile convulsions, CNS infections such as meningitis/encephalitis, significant traumatic brain injury, neurosurgical procedures   PAST MEDICAL HISTORY: Past Medical History:  Diagnosis Date   Anemia    Atrial fibrillation (HCC)    Paroxysmal, Flecainide therapy   Calf pain    September, 2012, at rest   Carotid bruit    Doppler, December, 2009, no abnormality   Cataract    Diverticulosis    GERD (gastroesophageal reflux disease)    History of shingles    HLD (hyperlipidemia)    HTN (hypertension)    Hypothyroidism    Insomnia    Osteoarthritis    PONV (postoperative nausea and vomiting)  Rectal bleeding 2001   diverticulosis and int hemorrhoids on 07/1999 and 02/2010 colonoscopies.   Seizures ([place])    Stroke ([place])    Warfarin anticoagulation     PAST SURGICAL HISTORY: Past Surgical History:  Procedure Laterality Date    BIOPSY  09/29/2015   Procedure: BIOPSY;  Surgeon: Rogene Houston, MD;  [facility];  Service: Endoscopy;;  gastric   CATARACT EXTRACTION W/PHACO Right 04/15/2014   Procedure: CATARACT EXTRACTION PHACO AND INTRAOCULAR LENS PLACEMENT ([place]);  Surgeon: Tonny Branch, MD;  Location: [facility];  Service: Ophthalmology;  Laterality: Right;  CDE: 13.30   CATARACT EXTRACTION W/PHACO Left 05/13/2014   Procedure: CATARACT EXTRACTION PHACO AND INTRAOCULAR LENS PLACEMENT LEFT EYE;  Surgeon: Tonny Branch, MD;  Location: [facility];  Service: Ophthalmology;  Laterality: Left;  CDE:13.00   CHOLECYSTECTOMY N/A 01/15/2015   Procedure: LAPAROSCOPIC CHOLECYSTECTOMY;  Surgeon: Ralene Ok, MD;  Location: [place];  Service: General;  Laterality: N/A;   COLONOSCOPY N/A 09/29/2015   Procedure: COLONOSCOPY;  Surgeon: Rogene Houston, MD;  [facility];  Service: Endoscopy;  Laterality: N/A;   COLONOSCOPY     ESOPHAGOGASTRODUODENOSCOPY N/A 01/13/2015   Procedure: ESOPHAGOGASTRODUODENOSCOPY (EGD);  Surgeon: Jerene Bears, MD;  Location: [facility] END[facility];  Service: Endoscopy;  Laterality: N/A;   ESOPHAGOGASTRODUODENOSCOPY N/A 09/29/2015   Procedure: ESOPHAGOGASTRODUODENOSCOPY (EGD);  Surgeon: Rogene Houston, MD;  [facility];  Service: Endoscopy;  Laterality: N/A;  12:15   GALLBLADDER SURGERY     LEFT HEART CATH AND CORONARY ANGIOGRAPHY N/A 06/22/2016   Procedure: Left Heart Cath and Coronary Angiography;  Surgeon: Troy Sine, MD;  Location: [place] [facility];  Service: Cardiovascular;  Laterality: N/A;   SHOULDER OPEN ROTATOR CUFF REPAIR     bilateral   TOTAL KNEE ARTHROPLASTY  05/26/10   right   TOTAL KNEE ARTHROPLASTY  11/17/2010   Procedure: TOTAL KNEE ARTHROPLASTY;  Surgeon: Mauri Pole;  Location: [facility];  Service: Orthopedics;  Laterality: Left;   TOTAL KNEE ARTHROPLASTY     Right   TOTAL SHOULDER REPLACEMENT  01/2010   left    MEDICATIONS: Current Outpatient Medications on  File Prior to Visit  Medication Sig Dispense Refill   Acetaminophen (TYLENOL) 325 MG CAPS Take 650 mg by mouth every 6 (six) hours as needed for mild pain.     ARTIFICIAL TEAR OP Apply 2 drops to eye daily as needed ([place]). To the left eye     atorvastatin (LIPITOR) 20 MG tablet Take 1 tablet (20 mg total) by mouth at bedtime. 90 tablet 3   digoxin (LANOXIN) 0.125 MG tablet Take 1 tablet (0.125 mg total) by mouth daily. (Patient taking differently: Take 0.125 mg by mouth every morning. ) 90 tablet 3   diltiazem (CARDIZEM CD) 180 MG 24 hr capsule Take 1 capsule (180 mg total) by mouth daily. 90 capsule 0   donepezil (ARICEPT) 5 MG tablet TAKE ONE TABLET AT BEDTIME FOR MEMORY 30 tablet 0   edoxaban (SAVAYSA) 30 MG TABS tablet Take 1 tablet (30 mg total) by mouth daily. 30 tablet 6   Eluxadoline (VIBERZI) 75 MG TABS Take 1 tablet by mouth 2 (two) times daily. (Patient not taking: Reported on 06/23/2018) 60 tablet 2   hydrALAZINE (APRESOLINE) 10 MG tablet Take 1 tablet (10 mg total) by mouth 2 (two) times daily. 180 tablet 0   levothyroxine (SYNTHROID, LEVOTHROID) 50 MCG tablet Take 1 tablet daily before breakfast (Patient taking differently: Take 50  mcg by mouth daily before breakfast. ) 90 tablet 3   loperamide (LOPERAMIDE A-D) 2 MG tablet Take 1 tablet (2 mg total) by mouth 4 (four) times daily as needed for diarrhea or loose stools. 120 tablet 3   Melatonin 5 MG TABS Take 1 tablet (5 mg total) by mouth at bedtime. 30 tablet 2   metoprolol succinate (TOPROL-XL) 100 MG 24 hr tablet Take 1 tablet (100 mg total) by mouth daily. Take with or immediately following a meal. (Patient taking differently: Take 100 mg by mouth every morning. ) 90 tablet 3   Multiple Vitamin (MULTIVITAMIN WITH MINERALS) TABS tablet Take 1 tablet by mouth every morning.     phenytoin (DILANTIN) 100 MG ER capsule Take 1 capsule (100 mg total) by mouth 3 (three) times daily. 270 capsule 1   rifaximin (XIFAXAN) 550  MG TABS tablet Take 1 tablet (550 mg total) by mouth 3 (three) times daily. 35 tablet 0   tamsulosin (FLOMAX) 0.4 MG CAPS capsule Take 1 capsule (0.4 mg total) by mouth daily. 90 capsule 0   traZODone (DESYREL) 50 MG tablet Take 0.5-1 tablets (25-50 mg total) by mouth at bedtime as needed for sleep. 30 tablet 1   No current facility-administered medications on file prior to visit.     ALLERGIES: Allergies  Allergen Reactions   Penicillins Rash and Other (See Comments)    Has patient had a PCN reaction causing immediate rash, facial/tongue/throat swelling, SOB or lightheadedness with hypotension: No Has patient had a PCN reaction causing severe rash involving mucus membranes or skin necrosis: No Has patient had a PCN reaction that required hospitalization No Has patient had a PCN reaction occurring within the last 10 years: No If all of the above answers are "NO", then may proceed with Cephalosporin use.    FAMILY HISTORY: Family History  Problem Relation Age of Onset   COPD Brother    Atrial fibrillation Brother    Diabetes Brother    Liver cancer Brother        Alcohol-related   Colon cancer Brother    Heart attack Father    Early death Father    Heart disease Father    Rheum arthritis Mother    Arthritis Mother    Heart disease Mother    Coronary artery disease Brother    Heart attack Daughter    Gallbladder disease Brother     SOCIAL HISTORY: Social History   Socioeconomic History   Marital status: Widowed    Spouse name: Not on file   Number of children: 1   Years of education: Not on file   Highest education level: Not on file  Occupational History   Occupation: retired    Comment: self - Engineer, 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   Food insecurity    Worry: Never true    Inability: Never true   Transportation needs    Medical: Not on file    Non-medical: Not on file  Tobacco Use   Smoking status:  Never Smoker   Smokeless tobacco: Never Used  Substance and Sexual Activity   Alcohol use: No   Drug use: No   Sexual activity: Never  Lifestyle   Physical activity    Days per week: Not on file    Minutes per session: Not on file   Stress: Only a little  Relationships   Social connections    Talks on phone: Not on file  Gets together: Not on file    Attends religious service: Not on file    Active member of club or organization: Not on file    Attends meetings of clubs or organizations: Not on file    Relationship status: Not on file   Intimate partner violence    Fear of current or ex partner: Not on file    Emotionally abused: Not on file    Physically abused: Not on file    Forced sexual activity: Not on file  Other Topics Concern   Not on file  Social History Narrative   Not on file    REVIEW OF SYSTEMS: Constitutional: No fevers, chills, or sweats, no generalized fatigue, change in appetite Eyes: No visual changes, double vision, eye pain Ear, nose and throat: No hearing loss, ear pain, nasal congestion, sore throat Cardiovascular: No chest pain, palpitations Respiratory:  No shortness of breath at rest or with exertion, wheezes GastrointestinaI: No nausea, vomiting, diarrhea, abdominal pain, fecal incontinence Genitourinary:  No dysuria, urinary retention or frequency Musculoskeletal:  No neck pain, back pain Integumentary: No rash, pruritus, skin lesions Neurological: as above Psychiatric: No depression, insomnia, anxiety Endocrine: No palpitations, fatigue, diaphoresis, mood swings, change in appetite, change in weight, increased thirst Hematologic/Lymphatic:  No anemia, purpura, petechiae. Allergic/Immunologic: no itchy/runny eyes, nasal congestion, recent allergic reactions, rashes  PHYSICAL EXAM: Vitals:   06/30/18 0918 06/30/18 0919  BP: (!) 180/78 (!) 176/76  Pulse: 93   Temp: 98 F (36.7 C)   SpO2: 96%    General: No acute  distress Head:  Normocephalic/atraumatic Neck: supple, no paraspinal tenderness, full range of motion Back: No paraspinal tenderness Heart: regular rate and rhythm Lungs: Clear to auscultation bilaterally. Vascular: No carotid bruits. Skin/Extremities: No rash, no edema Neurological Exam: Mental status: alert and oriented to person, city/state. States it is November 99. No dysarthria or aphasia, Fund of knowledge is reduced.  Recent and remote memory are impaired.  Attention and concentration are normal.    Able to name objects and repeat phrases.  MMSE - Mini Mental State Exam 06/30/2018 02/17/2018 01/18/2017  Orientation to time 0 4 2  Orientation to Place 2 5 5   Registration 3 3 3   Attention/ Calculation 5 5 5   Recall 0 2 0  Language- name 2 objects 2 2 2   Language- repeat 1 1 1   Language- follow 3 step command 0 3 3  Language- read & follow direction 1 1 1   Write a sentence 1 1 1   Copy design 0 1 1  Total score 15 28 24    Cranial nerves: CN I: not tested CN II: pupils equal, round and reactive to light, visual fields intact CN III, IV, VI:  full range of motion, no nystagmus, no ptosis CN V: facial sensation intact CN VII: upper and lower face symmetric CN VIII: hearing intact to conversation CN IX, X: gag intact, uvula midline CN XI: sternocleidomastoid and trapezius muscles intact CN XII: tongue midline Bulk & Tone: normal, no fasciculations. Motor: 5/5 throughout with no pronator drift. Sensation: intact to light touch, cold, pin.  Romberg test negative Deep Tendon Reflexes: +2 throughout Plantar responses: downgoing bilaterally Cerebellar: no incoordination on finger to nose testing Gait: slow and cautious with cane, no ataxia Tremor: none  IMPRESSION: This is an 85 year old right-handed woman with a history of hypertension, atrial fibrillation on chronic anticoagulation with Eliquis, CHF, presenting for evaluation of seizure and dementia. 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 MRI brain no acute changes, severe chronic microvascular disease, EEG had shown right hemisphere slowing. Family denies any further confusional episodes or staring spells since February 2020. She is tolerating Dilantin 100mg  TID without side effects, refills sent. MMSE today 15/30, continue Donepezil 5mg  daily. Continue 24/7 care. She does not drive. Follow-up in 6 months, they know to call for any changes.   Thank you for allowing me to participate in the care of this patient. Please do not hesitate to call for any questions or concerns.   Ellouise Newer, M.D.  CC: Dr. Warrick Parisian, Dr. Tyrell Antonio

## 2018-06-30 NOTE — Telephone Encounter (Signed)
New message   Pt c/o BP issue: STAT if pt c/o blurred vision, one-sided weakness or slurred speech  1. What are your last 5 BP readings? 176/76 hr 93   2. Are you having any other symptoms (ex. Dizziness, headache, blurred vision, passed out)?patient has AFIB   3. What is your BP issue? B/p is elevated

## 2018-07-02 NOTE — Telephone Encounter (Signed)
Agree with checking BPs as instructed.  Have the patient send results.

## 2018-07-05 ENCOUNTER — Other Ambulatory Visit: Payer: Self-pay | Admitting: Cardiology

## 2018-07-06 ENCOUNTER — Ambulatory Visit: Payer: Medicare Other

## 2018-07-17 ENCOUNTER — Other Ambulatory Visit: Payer: Medicare Other

## 2018-07-17 DIAGNOSIS — N390 Urinary tract infection, site not specified: Secondary | ICD-10-CM

## 2018-07-17 LAB — URINALYSIS
Bilirubin, UA: NEGATIVE
Glucose, UA: NEGATIVE
Ketones, UA: NEGATIVE
Nitrite, UA: POSITIVE — AB
Specific Gravity, UA: >1.03 — ABNORMAL HIGH (ref 1.005–1.030)
Urobilinogen, Ur: 0.2 mg/dL (ref 0.2–1.0)
pH, UA: 5.5 (ref 5.0–7.5)

## 2018-07-20 ENCOUNTER — Telehealth: Payer: Self-pay | Admitting: Family Medicine

## 2018-07-20 ENCOUNTER — Other Ambulatory Visit: Payer: Self-pay

## 2018-07-20 LAB — URINE CULTURE

## 2018-07-20 MED ORDER — CEPHALEXIN 500 MG PO CAPS: 500.0000 mg | ORAL_CAPSULE | Freq: Four times a day (QID) | ORAL | 0 refills | Status: DC

## 2018-07-20 MED ORDER — NITROFURANTOIN MONOHYD MACRO 100 MG PO CAPS: 100.0000 mg | ORAL_CAPSULE | Freq: Two times a day (BID) | ORAL | 0 refills | Status: DC

## 2018-07-20 NOTE — Telephone Encounter (Signed)
Please have patient disregard the Keflex that we sent as her culture finally came back and showed that it is resistant to the Keflex and I sent nitrofurantoin for her instead.

## 2018-07-20 NOTE — Patient Outreach (Addendum)
[name]) Care Management  07/20/2018  [name] 03-15-1932 [id number]   Called member at preferred number and member's sister/caretaker [name] answered phone and verified member's identity.   [name] stated that member "does not remember anything and she gets her days mixed up." [name] stated that member's medications continue to be delivered to [place] home in packages and [name] administers member's medications. [name] stated that [facility] care aide [name] comes on Mondays, Wednesdays, and Fridays to assist with ADLs and IADLs. [name] stated that she prepares member's meals and member is eating well and denies that member has had any falls and stated that she assists member with exercising at night. [name] states that member's son provides transportation for member to appointments. [name] denies any current needs for member and stated "she is doing much better, she just can't remember anything".   Member unable to participate in care plan at this time. Will update care plan. Will close case as member's care need met; however, member remains with risk for falls. Will transition to [employer]. Will send discipline closure letter to [facility] PCP. Will remain available for any upcoming needs and family made aware. Left voice mail message with member's son [name] requesting to return call.  [facility] CM Care Plan Problem One     Most Recent Value  Care Plan Problem One  Risk for hospital readmission as evidenced by recent admission  Role Documenting the Problem One  Care Management [place] for Problem One  Active  [facility]N Long Term Goal   Member will not be readmitted for next 31 days  [facility] Long Term Goal Start Date  04/06/18  [facility] Long Term Goal Met Date  06/08/18  [facility] CM Short Term Goal #1   Risk for injury related to history of and recent hospitalization for seizures  [facility] CM Short Term Goal #1 Start Date  04/06/18  [facility] CM Short Term Goal #1 Met Date   07/20/18  Interventions for Short Term Goal #1  Discussed falls with member's caretaker  [facility] CM Short Term Goal #2   High risk for falls related to PMH: Dementia as evidenced by member stating "I don't know" to questions about health history  [facility] CM Short Term Goal #2 Start Date  04/06/18  [facility] CM Short Term Goal #2 Met Date  07/20/18    [facility] CM Care Plan Problem Two     Most Recent Value  Care Plan Problem Two  "Knowledge Deficits related to COVID-19 and impact on patient self-Health management  Role Documenting the Problem Two  Care Management Coordinator  Care Plan for Problem Two  Active  THN Long Term Goal  Over next 31 days member will verbalize measures to prevent exposure to COVID-19  [place] Long Term Goal Start Date  04/17/18  [facility] Long Term Goal Met Date  07/20/18  [facility] CM Short Term Goal #1   "Over the next 30 days, patient will verbalize basic understanding of COVID-19 impact on individual health and self-health management as evidenced by verbalization of basic understanding of COVID-19 as a viral disease  [facility] CM Short Term Goal #1 Start Date  04/17/18  [facility] CM Short Term Goal #1 Met Date   07/20/18    [facility] CM Care Plan Problem Three     Most Recent Value  Care Plan Problem Three  Risk for falls as manifested by age and need for ambulatory aides  Role Documenting the Problem Three  Care Management  Coordinator  Care Plan for Problem Three  Active  THN Long Term Goal   Member will decrease risk of falls for next 90 days as manifested by verbal report  [facility]N Long Term Goal Start Date  05/08/18  [facility] Long Term Goal Met Date  06/08/18  [facility] CM Short Term Goal #1   Member will call Provider of 911 as needed  [facility] CM Short Term Goal #1 Start Date  05/08/18  [facility] CM Short Term Goal #1 Met Date  07/20/18  Interventions for Short Term Goal #1  member unable to participate in care plan at this time      [name], [place] Management  Community Care Management Coordinator   [phone] [email or web address]

## 2018-07-20 NOTE — Telephone Encounter (Signed)
-----   Message from Karle Plumber, [place] sent at 07/20/2018 12:17 PM EDT ----- Patient states that she has been having dysuria and urinary frequency x 2-3 days.

## 2018-07-20 NOTE — Telephone Encounter (Signed)
Refer to lab note °

## 2018-07-20 NOTE — Telephone Encounter (Signed)
Discussed with [place]

## 2018-07-20 NOTE — Telephone Encounter (Signed)
2 - Antibiotics sent / fixed at [place] stay on the 200 pm Macrobid.

## 2018-07-20 NOTE — Telephone Encounter (Signed)
I sent Keflex for the patient based on her urinalysis, if she continues to have issues then she may need to go see urology.

## 2018-07-20 NOTE — Telephone Encounter (Signed)
Patient aware and verbalizes understanding. 

## 2018-07-21 ENCOUNTER — Telehealth: Payer: Self-pay | Admitting: Cardiology

## 2018-07-21 NOTE — Telephone Encounter (Signed)
  Pt c/o BP issue: STAT if pt c/o blurred vision, one-sided weakness or slurred speech  1. What are your last 5 BP readings?   6/29 165/84 P 71,  157/84 P 76 7/2   190/90 P 81,  156/91 P 79 7/5   122/78 P 81.  7/8   144/84 P70 7/10 161/93 P 73 7/12 160/84 P 70 7/13 133/81 P 81,  124/79 P 83 7/14 160/88 P 70  2. Are you having any other symptoms (ex. Dizziness, headache, blurred vision, passed out)? NA  3. What is your BP issue? Patient was asked to keep a record and call with results. She is taking her BP with a wrist monitor. Son says leave a message if he does not answer. If Dr Percival Spanish wants to change meds please use [facility]

## 2018-07-24 NOTE — Telephone Encounter (Signed)
I would suggest that she increase her Cardizem to 240 mg po daily as the majority of the BPs are above target.

## 2018-07-24 NOTE — Telephone Encounter (Signed)
[id number] MESSAGE

## 2018-07-25 MED ORDER — DILTIAZEM HCL ER COATED BEADS 240 MG PO CP24: 240.0000 mg | ORAL_CAPSULE | Freq: Every day | ORAL | 6 refills | Status: DC

## 2018-07-25 NOTE — Telephone Encounter (Signed)
Left message to call back  

## 2018-07-25 NOTE — Telephone Encounter (Signed)
1-Sent MyChart message  2-Left detailed message as noted for patient (DPR), call back w/any questions

## 2018-08-04 ENCOUNTER — Other Ambulatory Visit: Payer: Self-pay | Admitting: Family Medicine

## 2018-08-04 DIAGNOSIS — G4700 Insomnia, unspecified: Secondary | ICD-10-CM

## 2018-08-09 NOTE — Telephone Encounter (Signed)
Returned call to pt she states that she does not use the Mychart. She states that she is taking her medications as ordered. She states that she has a sitter/caregive that takes her BP so she does not know what it is right now. She states that [name] will be there about NOON. I will try to CB then

## 2018-08-09 NOTE — Telephone Encounter (Signed)
Called Molly back she states that pt's BP today is 137/87 HR 76  08-08-2018 162/100 HR 84 then 133/67. BP is back to normal. Pharmacy packs her medications will need to call them. Called pharmacy they have added the Dilt 240 to her medication pack.  This is probably why BP is back to normal. Molly notified she will continue to take BP daily. She will CB if BP is out of range

## 2018-08-11 ENCOUNTER — Encounter: Payer: Self-pay | Admitting: Family

## 2018-08-11 ENCOUNTER — Ambulatory Visit (INDEPENDENT_AMBULATORY_CARE_PROVIDER_SITE_OTHER): Payer: Medicare Other | Admitting: Family

## 2018-08-11 DIAGNOSIS — N390 Urinary tract infection, site not specified: Secondary | ICD-10-CM

## 2018-08-11 MED ORDER — CEPHALEXIN 500 MG PO CAPS: 500.0000 mg | ORAL_CAPSULE | Freq: Two times a day (BID) | ORAL | 0 refills | Status: DC

## 2018-08-11 NOTE — Progress Notes (Signed)
   Virtual Visit via telephone Note Due to COVID-19 pandemic this visit was conducted virtually. This visit type was conducted due to national recommendations for restrictions regarding the COVID-19 Pandemic (e.g. social distancing, sheltering in place) in an effort to limit this patient's exposure and mitigate transmission in our community. All issues noted in this document were discussed and addressed.  A physical exam was not performed with this format.  I connected with [name] on 08/11/18 at 1:44 pm by telephone and verified that I am speaking with the correct person using two identifiers. [name] is currently located at home and no one is currently with her during visit. The provider, [name], FNP is located in their office at time of visit.  I discussed the limitations, risks, security and privacy concerns of performing an evaluation and management service by telephone and the availability of in person appointments. I also discussed with the patient that there may be a patient responsible charge related to this service. The patient expressed understanding and agreed to proceed.   History and Present Illness:  Pt calls today with recurrent UTI symptoms. She states "It feels like some one is sticking a knife up my vagina". She had a UTI on 07/20/18 and was treated with Macrobid. She states she was feeling better, but her symptoms returned.  Urinary Frequency  This is a new problem. The current episode started in the past 7 days. The problem occurs intermittently. The problem has been waxing and waning. The quality of the pain is described as burning. The pain is at a severity of 8/10. The pain is mild. Associated symptoms include frequency, hesitancy, nausea and urgency. Pertinent negatives include no hematuria. She has tried increased fluids for the symptoms. The treatment provided mild relief.      Review of Systems  Gastrointestinal: Positive for nausea.   Genitourinary: Positive for frequency, hesitancy and urgency. Negative for hematuria.  All other systems reviewed and are negative.    Observations/Objective: No SOB or distress noted   Assessment and Plan: 1. Recurrent UTI Force fluids Start Keflex today If symptoms worsen or do not improve, you need to be seen for for further testing - cephALEXin (KEFLEX) 500 MG capsule; Take 1 capsule (500 mg total) by mouth 2 (two) times daily.  Dispense: 14 capsule; Refill: 0    I discussed the assessment and treatment plan with the patient. The patient was provided an opportunity to ask questions and all were answered. The patient agreed with the plan and demonstrated an understanding of the instructions.   The patient was advised to call back or seek an in-person evaluation if the symptoms worsen or if the condition fails to improve as anticipated.  The above assessment and management plan was discussed with the patient. The patient verbalized understanding of and has agreed to the management plan. Patient is aware to call the clinic if symptoms persist or worsen. Patient is aware when to return to the clinic for a follow-up visit. Patient educated on when it is appropriate to go to the emergency department.   Time call ended: 1:52 pm    I provided 8 minutes of non-face-to-face time during this encounter.    [name], FNP

## 2018-08-18 ENCOUNTER — Other Ambulatory Visit: Payer: Self-pay

## 2018-08-21 ENCOUNTER — Ambulatory Visit (INDEPENDENT_AMBULATORY_CARE_PROVIDER_SITE_OTHER): Payer: Medicare Other | Admitting: Family Medicine

## 2018-08-21 ENCOUNTER — Other Ambulatory Visit: Payer: Self-pay

## 2018-08-21 ENCOUNTER — Encounter: Payer: Self-pay | Admitting: Family Medicine

## 2018-08-21 DIAGNOSIS — I480 Paroxysmal atrial fibrillation: Secondary | ICD-10-CM | POA: Diagnosis not present

## 2018-08-21 DIAGNOSIS — E039 Hypothyroidism, unspecified: Secondary | ICD-10-CM

## 2018-08-21 DIAGNOSIS — F03A0 Unspecified dementia, mild, without behavioral disturbance, psychotic disturbance, mood disturbance, and anxiety: Secondary | ICD-10-CM

## 2018-08-21 DIAGNOSIS — F0390 Unspecified dementia without behavioral disturbance: Secondary | ICD-10-CM

## 2018-08-21 DIAGNOSIS — I10 Essential (primary) hypertension: Secondary | ICD-10-CM

## 2018-08-21 NOTE — Progress Notes (Signed)
Virtual Visit via telephone Note  I connected with [name] on 08/21/18 at 1525 by telephone and verified that I am speaking with the correct person using two identifiers. [name] is currently located at home and daughter are currently with her during visit. The provider, Fransisca Kaufmann Lyana Asbill, MD is located in their office at time of visit.  Call ended at 1546  I discussed the limitations, risks, security and privacy concerns of performing an evaluation and management service by telephone and the availability of in person appointments. I also discussed with the patient that there may be a patient responsible charge related to this service. The patient expressed understanding and agreed to proceed.   History and Present Illness: Patient is still having abd pain but they have improved.  She has abdominal in the morning and had diarrhea was when she was drinking coffee regularly. She is doing better now.    Hypothyroidism recheck Patient is coming in for thyroid recheck today as well. They deny any issues with hair changes or heat or cold problems or diarrhea or constipation. They deny any chest pain or palpitations. They are currently on levothyroxine 50 micrograms   Hypertension Patient is currently on digoxin and diltiazem and hydralazine and metoprolol, and their blood pressure today is 122/73. Patient denies any lightheadedness or dizziness. Patient denies headaches, blurred vision, chest pains, shortness of breath, or weakness. Denies any side effects from medication and is content with current medication.   Dementia She has been taking donepezil and feels like it is helping.  They are happy with where it sat, she still has good days and bad days.  They deny her any major memory issues but believe it still mild.  No diagnosis found.  Outpatient Encounter Medications as of 08/21/2018  Medication Sig  . Acetaminophen (TYLENOL) 325 MG CAPS Take 650 mg by mouth every 6 (six)  hours as needed for mild pain.  Marland Kitchen ARTIFICIAL TEAR OP Apply 2 drops to eye daily as needed (Dryness). To the left eye  . atorvastatin (LIPITOR) 20 MG tablet Take 1 tablet (20 mg total) by mouth at bedtime.  . cephALEXin (KEFLEX) 500 MG capsule Take 1 capsule (500 mg total) by mouth 2 (two) times daily.  . digoxin (LANOXIN) 0.125 MG tablet Take 1 tablet (0.125 mg total) by mouth daily. (Patient taking differently: Take 0.125 mg by mouth every morning. )  . diltiazem (CARDIZEM CD) 240 MG 24 hr capsule Take 1 capsule (240 mg total) by mouth daily.  Marland Kitchen donepezil (ARICEPT) 5 MG tablet TAKE ONE TABLET AT BEDTIME FOR MEMORY  . GNP MELATONIN MAXIMUM STRENGTH 5 MG TABS TAKE ONE TABLET AT BEDTIME  . hydrALAZINE (APRESOLINE) 10 MG tablet Take 1 tablet (10 mg total) by mouth 2 (two) times daily.  Marland Kitchen levothyroxine (SYNTHROID, LEVOTHROID) 50 MCG tablet Take 1 tablet daily before breakfast (Patient taking differently: Take 50 mcg by mouth daily before breakfast. )  . loperamide (LOPERAMIDE A-D) 2 MG tablet Take 1 tablet (2 mg total) by mouth 4 (four) times daily as needed for diarrhea or loose stools.  . metoprolol succinate (TOPROL-XL) 100 MG 24 hr tablet Take 1 tablet (100 mg total) by mouth daily. Take with or immediately following a meal. (Patient taking differently: Take 100 mg by mouth every morning. )  . Multiple Vitamin (MULTIVITAMIN WITH MINERALS) TABS tablet Take 1 tablet by mouth every morning.  . nitrofurantoin, macrocrystal-monohydrate, (MACROBID) 100 MG capsule Take 1 capsule (100 mg  total) by mouth 2 (two) times daily. 1 po BId  . phenytoin (DILANTIN) 100 MG ER capsule Take 1 capsule (100 mg total) by mouth 3 (three) times daily.  Marland Kitchen SAVAYSA 30 MG TABS tablet TAKE 1 TABLET ONCE A DAY FOR AFIB  . tamsulosin (FLOMAX) 0.4 MG CAPS capsule Take 1 capsule (0.4 mg total) by mouth daily.  . traZODone (DESYREL) 50 MG tablet Take 0.5-1 tablets (25-50 mg total) by mouth at bedtime as needed for sleep.   No  facility-administered encounter medications on file as of 08/21/2018.     Review of Systems  Constitutional: Negative for chills and fever.  HENT: Negative for congestion, ear discharge and ear pain.   Eyes: Negative for redness and visual disturbance.  Respiratory: Negative for chest tightness and shortness of breath.   Cardiovascular: Negative for chest pain and leg swelling.  Gastrointestinal: Positive for abdominal pain and diarrhea. Negative for blood in stool, constipation and vomiting.  Genitourinary: Negative for difficulty urinating and dysuria.  Musculoskeletal: Negative for back pain and gait problem.  Skin: Negative for rash.  Neurological: Negative for dizziness, light-headedness and headaches.  Psychiatric/Behavioral: Negative for agitation and behavioral problems.  All other systems reviewed and are negative.   Observations/Objective: Patient sounds comfortable and in no acute distress  Assessment and Plan: Problem List Items Addressed This Visit      Cardiovascular and Mediastinum   Paroxysmal atrial fibrillation ([place]) - Primary   Relevant Orders   TSH   CBC with Differential/Platelet   CMP14+EGFR   Lipid panel   HTN (hypertension)     Endocrine   Hypothyroidism     Nervous and Auditory   Mild dementia ([place])       Follow Up Instructions:  Blood thinner now savaysa instead of warfarin  Continue current thyroid dose and hypertension and dementia memory medication, no change she continues to see cardiology. I would like her to come check blood work but she will have to wait till that she is asymptomatic to come in and check blood work.   I discussed the assessment and treatment plan with the patient. The patient was provided an opportunity to ask questions and all were answered. The patient agreed with the plan and demonstrated an understanding of the instructions.   The patient was advised to call back or seek an in-person evaluation if the symptoms  worsen or if the condition fails to improve as anticipated.  The above assessment and management plan was discussed with the patient. The patient verbalized understanding of and has agreed to the management plan. Patient is aware to call the clinic if symptoms persist or worsen. Patient is aware when to return to the clinic for a follow-up visit. Patient educated on when it is appropriate to go to the emergency department.    I provided 21 minutes of non-face-to-face time during this encounter.    Worthy Rancher, MD

## 2018-08-30 ENCOUNTER — Other Ambulatory Visit: Payer: Self-pay | Admitting: Family Medicine

## 2018-08-30 DIAGNOSIS — G4700 Insomnia, unspecified: Secondary | ICD-10-CM

## 2018-09-25 ENCOUNTER — Other Ambulatory Visit: Payer: Self-pay

## 2018-09-25 ENCOUNTER — Encounter: Payer: Self-pay | Admitting: Family Medicine

## 2018-09-25 ENCOUNTER — Ambulatory Visit (INDEPENDENT_AMBULATORY_CARE_PROVIDER_SITE_OTHER): Payer: Medicare Other | Admitting: Family Medicine

## 2018-09-25 DIAGNOSIS — Z87440 Personal history of urinary (tract) infections: Secondary | ICD-10-CM

## 2018-09-25 NOTE — Progress Notes (Signed)
Virtual Visit via telephone Note Due to COVID-19 pandemic this visit was conducted virtually. This visit type was conducted due to national recommendations for restrictions regarding the COVID-19 Pandemic (e.g. social distancing, sheltering in place) in an effort to limit this patient's exposure and mitigate transmission in our community. All issues noted in this document were discussed and addressed.  A physical exam was not performed with this format.   I connected with [name] on 09/25/18 at 1420 by telephone and verified that I am speaking with the correct person using two identifiers. [name] is currently located at home and family is currently with them during visit. The provider, Monia Pouch, FNP is located in their office at time of visit.  I discussed the limitations, risks, security and privacy concerns of performing an evaluation and management service by telephone and the availability of in person appointments. I also discussed with the patient that there may be a patient responsible charge related to this service. The patient expressed understanding and agreed to proceed.  Subjective:  Patient ID: [name], female    DOB: May 09, 1932, 85 y.o.   MRN: [id number]  Chief Complaint:  Urinary Tract Infection (follow up)   HPI: [name] is a 85 y.o. female presenting on 09/25/2018 for Urinary Tract Infection (follow up)   Pt following up today after treatment of UTI. Pt states she completed her antibiotics and feels great. No residual symptoms.   Urinary Tract Infection  The problem has been resolved. The patient is experiencing no pain. There has been no fever. Pertinent negatives include no chills, discharge, flank pain, frequency, hematuria, hesitancy, nausea, possible pregnancy, sweats, urgency or vomiting. She has tried antibiotics for the symptoms. The treatment provided significant relief.     Relevant past medical, surgical, family, and social  history reviewed and updated as indicated.  Allergies and medications reviewed and updated.   Past Medical History:  Diagnosis Date  . Anemia   . Atrial fibrillation (HCC)    Paroxysmal, Flecainide therapy  . Calf pain    September, 2012, at rest  . Carotid bruit    Doppler, December, 2009, no abnormality  . Cataract   . Diverticulosis   . GERD (gastroesophageal reflux disease)   . History of shingles   . HLD (hyperlipidemia)   . HTN (hypertension)   . Hypothyroidism   . Insomnia   . Osteoarthritis   . PONV (postoperative nausea and vomiting)   . Rectal bleeding 2001   diverticulosis and int hemorrhoids on 07/1999 and 02/2010 colonoscopies.  . Seizures ([place])   . Stroke ([place])   . Warfarin anticoagulation     Past Surgical History:  Procedure Laterality Date  . BIOPSY  09/29/2015   Procedure: BIOPSY;  Surgeon: Rogene Houston, MD;  Location: [facility];  Service: Endoscopy;;  gastric  . CATARACT EXTRACTION W/PHACO Right 04/15/2014   Procedure: CATARACT EXTRACTION PHACO AND INTRAOCULAR LENS PLACEMENT (IOC);  Surgeon: Tonny Branch, MD;  Location: [facility];  Service: Ophthalmology;  Laterality: Right;  CDE: 13.30  . CATARACT EXTRACTION W/PHACO Left 05/13/2014   Procedure: CATARACT EXTRACTION PHACO AND INTRAOCULAR LENS PLACEMENT LEFT EYE;  Surgeon: Tonny Branch, MD;  Location: [facility];  Service: Ophthalmology;  Laterality: Left;  CDE:13.00  . CHOLECYSTECTOMY N/A 01/15/2015   Procedure: LAPAROSCOPIC CHOLECYSTECTOMY;  Surgeon: Ralene Ok, MD;  Location: [facility];  Service: General;  Laterality: N/A;  . COLONOSCOPY N/A 09/29/2015   Procedure: COLONOSCOPY;  Surgeon: Mechele Dawley  Laural Golden, MD;  [facility]: [facility];  Service: Endoscopy;  Laterality: N/A;  . COLONOSCOPY    . ESOPHAGOGASTRODUODENOSCOPY N/A 01/13/2015   Procedure: ESOPHAGOGASTRODUODENOSCOPY (EGD);  Surgeon: Jerene Bears, MD;  Location: [facility];  Service: Endoscopy;  Laterality: N/A;  . ESOPHAGOGASTRODUODENOSCOPY N/A 09/29/2015    Procedure: ESOPHAGOGASTRODUODENOSCOPY (EGD);  Surgeon: Rogene Houston, MD;  [facility]: [facility];  Service: Endoscopy;  Laterality: N/A;  12:15  . GALLBLADDER SURGERY    . LEFT HEART CATH AND CORONARY ANGIOGRAPHY N/A 06/22/2016   Procedure: Left Heart Cath and Coronary Angiography;  Surgeon: Troy Sine, MD;  Location: [facility];  Service: Cardiovascular;  Laterality: N/A;  . SHOULDER OPEN ROTATOR CUFF REPAIR     bilateral  . TOTAL KNEE ARTHROPLASTY  05/26/10   right  . TOTAL KNEE ARTHROPLASTY  11/17/2010   Procedure: TOTAL KNEE ARTHROPLASTY;  Surgeon: Mauri Pole;  Location: [facility];  Service: Orthopedics;  Laterality: Left;  . TOTAL KNEE ARTHROPLASTY     Right  . TOTAL SHOULDER REPLACEMENT  01/2010   left    Social History   Socioeconomic History  . Marital status: Widowed    Spouse name: Not on file  . Number of children: 1  . Years of education: Not on file  . Highest education level: Not on file  Occupational History  . Occupation: retired    Comment: self - hair-dresser  Social Needs  . Financial resource strain: Not hard at all  . Food insecurity    Worry: Never true    Inability: Never true  . Transportation needs    Medical: Not on file    Non-medical: Not on file  Tobacco Use  . Smoking status: Never Smoker  . Smokeless tobacco: Never Used  Substance and Sexual Activity  . Alcohol use: No  . Drug use: No  . Sexual activity: Never  Lifestyle  . Physical activity    Days per week: Not on file    Minutes per session: Not on file  . Stress: Only a little  Relationships  . Social Herbalist on phone: Not on file    Gets together: Not on file    Attends religious service: Not on file    Active member of club or organization: Not on file    Attends meetings of clubs or organizations: Not on file    Relationship status: Not on file  . Intimate partner violence    Fear of current or ex partner: Not on file    Emotionally abused: Not  on file    Physically abused: Not on file    Forced sexual activity: Not on file  Other Topics Concern  . Not on file  Social History Narrative   Right handed    Split level. Only one step to go outside    Outpatient Encounter Medications as of 09/25/2018  Medication Sig  . Acetaminophen (TYLENOL) 325 MG CAPS Take 650 mg by mouth every 6 (six) hours as needed for mild pain.  Marland Kitchen ARTIFICIAL TEAR OP Apply 2 drops to eye daily as needed (Dryness). To the left eye  . atorvastatin (LIPITOR) 20 MG tablet Take 1 tablet (20 mg total) by mouth at bedtime.  . digoxin (LANOXIN) 0.125 MG tablet Take 1 tablet (0.125 mg total) by mouth daily. (Patient taking differently: Take 0.125 mg by mouth every morning. )  . diltiazem (CARDIZEM CD) 240 MG 24 hr capsule Take 1 capsule (240  mg total) by mouth daily.  Marland Kitchen donepezil (ARICEPT) 5 MG tablet TAKE ONE TABLET AT BEDTIME FOR MEMORY  . GNP MELATONIN MAXIMUM STRENGTH 5 MG TABS TAKE ONE TABLET AT BEDTIME  . hydrALAZINE (APRESOLINE) 10 MG tablet Take 1 tablet (10 mg total) by mouth 2 (two) times daily.  Marland Kitchen levothyroxine (SYNTHROID, LEVOTHROID) 50 MCG tablet Take 1 tablet daily before breakfast (Patient taking differently: Take 50 mcg by mouth daily before breakfast. )  . loperamide (LOPERAMIDE A-D) 2 MG tablet Take 1 tablet (2 mg total) by mouth 4 (four) times daily as needed for diarrhea or loose stools.  . metoprolol succinate (TOPROL-XL) 100 MG 24 hr tablet Take 1 tablet (100 mg total) by mouth daily. Take with or immediately following a meal. (Patient taking differently: Take 100 mg by mouth every morning. )  . Multiple Vitamin (MULTIVITAMIN WITH MINERALS) TABS tablet Take 1 tablet by mouth every morning.  . nitrofurantoin, macrocrystal-monohydrate, (MACROBID) 100 MG capsule Take 1 capsule (100 mg total) by mouth 2 (two) times daily. 1 po BId  . phenytoin (DILANTIN) 100 MG ER capsule Take 1 capsule (100 mg total) by mouth 3 (three) times daily.  Marland Kitchen SAVAYSA 30 MG TABS  tablet TAKE 1 TABLET ONCE A DAY FOR AFIB  . tamsulosin (FLOMAX) 0.4 MG CAPS capsule Take 1 capsule (0.4 mg total) by mouth daily.  . traZODone (DESYREL) 50 MG tablet TAKE 1/2 TO 1 TABLET AT BEDTIME AS NEEDED FOR SLEEP   No facility-administered encounter medications on file as of 09/25/2018.     Allergies  Allergen Reactions  . Penicillins Rash and Other (See Comments)    Has patient had a PCN reaction causing immediate rash, facial/tongue/throat swelling, SOB or lightheadedness with hypotension: No Has patient had a PCN reaction causing severe rash involving mucus membranes or skin necrosis: No Has patient had a PCN reaction that required hospitalization No Has patient had a PCN reaction occurring within the last 10 years: No If all of the above answers are "NO", then may proceed with Cephalosporin use.    Review of Systems  Constitutional: Negative for activity change, appetite change, chills, fatigue and fever.  HENT: Negative.   Eyes: Negative.   Respiratory: Negative for cough, chest tightness and shortness of breath.   Cardiovascular: Negative for chest pain, palpitations and leg swelling.  Gastrointestinal: Negative for blood in stool, constipation, diarrhea, nausea and vomiting.  Endocrine: Negative.   Genitourinary: Negative for decreased urine volume, difficulty urinating, dysuria, flank pain, frequency, hematuria, hesitancy and urgency.  Musculoskeletal: Negative for arthralgias and myalgias.  Skin: Negative.   Allergic/Immunologic: Negative.   Neurological: Negative for dizziness and headaches.  Hematological: Negative.   Psychiatric/Behavioral: Negative for confusion, hallucinations, sleep disturbance and suicidal ideas.  All other systems reviewed and are negative.        Observations/Objective: No vital signs or physical exam, this was a telephone or virtual health encounter.  Pt alert and oriented, answers all questions appropriately, and able to speak in full  sentences.    Assessment and Plan: Iridessa was seen today for urinary tract infection.  Diagnoses and all orders for this visit:  History of UTI No residual symptoms, UTI resolved. Pt aware to report any return of symptoms.     Follow Up Instructions: Return if symptoms worsen or fail to improve.    I discussed the assessment and treatment plan with the patient. The patient was provided an opportunity to ask questions and all were answered. The patient agreed  with the plan and demonstrated an understanding of the instructions.   The patient was advised to call back or seek an in-person evaluation if the symptoms worsen or if the condition fails to improve as anticipated.  The above assessment and management plan was discussed with the patient. The patient verbalized understanding of and has agreed to the management plan. Patient is aware to call the clinic if they develop any new symptoms or if symptoms persist or worsen. Patient is aware when to return to the clinic for a follow-up visit. Patient educated on when it is appropriate to go to the emergency department.    I provided 10 minutes of non-face-to-face time during this encounter. The call started at 1420. The call ended at 1430. The other time was used for coordination of care.    Monia Pouch, FNP-C [facility] [address] [place], [place] [address] [phone] 09/25/18

## 2018-09-27 ENCOUNTER — Other Ambulatory Visit: Payer: Self-pay | Admitting: Family Medicine

## 2018-09-27 DIAGNOSIS — G4700 Insomnia, unspecified: Secondary | ICD-10-CM

## 2018-09-29 ENCOUNTER — Other Ambulatory Visit: Payer: Self-pay | Admitting: Family Medicine

## 2018-09-29 DIAGNOSIS — G4700 Insomnia, unspecified: Secondary | ICD-10-CM

## 2018-10-26 ENCOUNTER — Other Ambulatory Visit: Payer: Self-pay | Admitting: Family Medicine

## 2018-10-26 DIAGNOSIS — G4700 Insomnia, unspecified: Secondary | ICD-10-CM

## 2018-11-23 ENCOUNTER — Other Ambulatory Visit: Payer: Self-pay | Admitting: Family Medicine

## 2018-12-07 ENCOUNTER — Other Ambulatory Visit: Payer: Self-pay | Admitting: Family Medicine

## 2018-12-07 ENCOUNTER — Ambulatory Visit: Payer: Medicare Other | Admitting: Neurology

## 2018-12-08 ENCOUNTER — Other Ambulatory Visit: Payer: Self-pay | Admitting: Family Medicine

## 2018-12-08 DIAGNOSIS — G4700 Insomnia, unspecified: Secondary | ICD-10-CM

## 2018-12-20 ENCOUNTER — Other Ambulatory Visit: Payer: Self-pay | Admitting: Family Medicine

## 2018-12-26 ENCOUNTER — Encounter: Payer: Self-pay | Admitting: Family Medicine

## 2018-12-26 ENCOUNTER — Ambulatory Visit (INDEPENDENT_AMBULATORY_CARE_PROVIDER_SITE_OTHER): Payer: Medicare Other | Admitting: Family Medicine

## 2018-12-26 DIAGNOSIS — B373 Candidiasis of vulva and vagina: Secondary | ICD-10-CM | POA: Diagnosis not present

## 2018-12-26 DIAGNOSIS — B3731 Acute candidiasis of vulva and vagina: Secondary | ICD-10-CM

## 2018-12-26 MED ORDER — CLOTRIMAZOLE-BETAMETHASONE 1-0.05 % EX CREA: 1.0000 "application " | TOPICAL_CREAM | Freq: Two times a day (BID) | CUTANEOUS | 1 refills | Status: DC

## 2018-12-26 MED ORDER — FLUCONAZOLE 100 MG PO TABS: ORAL_TABLET | ORAL | 0 refills | Status: DC

## 2018-12-26 NOTE — Progress Notes (Signed)
Subjective:    Patient ID: [name], female    DOB: Jun 03, 1932, 85 y.o.   MRN: [id number]   HPI: [name] is a 85 y.o. female presenting for Onset yesterday of sores burning and  itching at her bottom with redness . Unable to describe it further. States "I'm not in my right mind these days."   Depression screen [facility] 2/9 04/17/2018 03/14/2018 02/17/2018 01/19/2018 01/06/2018  Decreased Interest 1 0 0 0 0  Down, Depressed, Hopeless 0 0 0 0 0  PHQ - 2 Score 1 0 0 0 0  Altered sleeping 0 - - - -  Tired, decreased energy 1 - - - -  Change in appetite 0 - - - -  Feeling bad or failure about yourself  0 - - - -  Trouble concentrating 1 - - - -  Moving slowly or fidgety/restless 0 - - - -  Suicidal thoughts 0 - - - -  PHQ-9 Score 3 - - - -  Difficult doing work/chores - - - - -  Some recent data might be hidden     Relevant past medical, surgical, family and social history reviewed and updated as indicated.  Interim medical history since our last visit reviewed. Allergies and medications reviewed and updated.  ROS:  Review of Systems  Constitutional: Negative for activity change and fever.  Gastrointestinal: Negative for abdominal pain.  Genitourinary: Negative for dysuria and flank pain.     Social History   Tobacco Use  Smoking Status Never Smoker  Smokeless Tobacco Never Used       Objective:     Wt Readings from Last 3 Encounters:  06/30/18 125 lb (56.7 kg)  06/14/18 120 lb (54.4 kg)  04/06/18 127 lb (57.6 kg)     Exam deferred. Pt. Harboring due to COVID 19. Phone visit performed.   Assessment & Plan:   1. Yeast vaginitis     Meds ordered this encounter  Medications  . fluconazole (DIFLUCAN) 100 MG tablet    Sig: Take two with first dose. Then starting the next day take one dailyfor 4 days    Dispense:  6 tablet    Refill:  0    Pt. Requests delivery, thanks, WS  . clotrimazole-betamethasone (LOTRISONE) cream    Sig: Apply 1 application  topically 2 (two) times daily. To affected areas until rash clears    Dispense:  45 g    Refill:  1    Pt. Requests delivery, thanks, WS    No orders of the defined types were placed in this encounter.     Diagnoses and all orders for this visit:  Yeast vaginitis  Other orders -     fluconazole (DIFLUCAN) 100 MG tablet; Take two with first dose. Then starting the next day take one dailyfor 4 days -     clotrimazole-betamethasone (LOTRISONE) cream; Apply 1 application topically 2 (two) times daily. To affected areas until rash clears    Virtual Visit via telephone Note  I discussed the limitations, risks, security and privacy concerns of performing an evaluation and management service by telephone and the availability of in person appointments. The patient was identified with two identifiers. Pt.expressed understanding and agreed to proceed. Pt. Is at home. Dr. Livia Snellen is in his office.  Follow Up Instructions:   I discussed the assessment and treatment plan with the patient. The patient was provided an opportunity to ask questions and all were answered. The patient agreed  with the plan and demonstrated an understanding of the instructions.   The patient was advised to call back or seek an in-person evaluation if the symptoms worsen or if the condition fails to improve as anticipated.   Total minutes including chart review and phone contact time: 14   Follow up plan: Return if symptoms worsen or fail to improve.  Claretta Fraise, MD [facility]

## 2019-01-02 ENCOUNTER — Other Ambulatory Visit: Payer: Self-pay | Admitting: Family Medicine

## 2019-01-10 ENCOUNTER — Encounter: Payer: Self-pay | Admitting: Neurology

## 2019-01-10 ENCOUNTER — Other Ambulatory Visit: Payer: Self-pay

## 2019-01-10 ENCOUNTER — Telehealth (INDEPENDENT_AMBULATORY_CARE_PROVIDER_SITE_OTHER): Payer: Medicare Other | Admitting: Neurology

## 2019-01-10 VITALS — BP 148/93 | HR 80 | Ht 62.0 in | Wt 121.0 lb

## 2019-01-10 DIAGNOSIS — G40009 Localization-related (focal) (partial) idiopathic epilepsy and epileptic syndromes with seizures of localized onset, not intractable, without status epilepticus: Secondary | ICD-10-CM | POA: Diagnosis not present

## 2019-01-10 DIAGNOSIS — F03A0 Unspecified dementia, mild, without behavioral disturbance, psychotic disturbance, mood disturbance, and anxiety: Secondary | ICD-10-CM

## 2019-01-10 DIAGNOSIS — F0390 Unspecified dementia without behavioral disturbance: Secondary | ICD-10-CM | POA: Diagnosis not present

## 2019-01-10 NOTE — Progress Notes (Signed)
Telephone (Audio) Visit The purpose of this telephone visit is to provide medical care while limiting exposure to the novel coronavirus.    Consent was obtained for telephone visit:  Yes.   Answered questions that patient had about telehealth interaction:  Yes.   I discussed the limitations, risks, security and privacy concerns of performing an evaluation and management service by telephone. I also discussed with the patient that there may be a patient responsible charge related to this service. The patient expressed understanding and agreed to proceed.  Pt location: Home Physician Location: office Name of referring provider:  Dettinger, Fransisca Kaufmann, MD I connected with .[name] at patients initiation/request on 01/10/2019 at 11:30 AM EST by telephone and verified that I am speaking with the correct person using two identifiers.  Pt MRN:  [id number] Pt DOB:  01/17/1932   History of Present Illness:  The patient had a telephone visit on 01/10/2019. She was last seen in the neurology clinic 6 months ago for seizures and dementia. Her caregiver [name] is present during the visit to provide additional information. The patient lives with her sister. [name] comes a few hours a week to monitor her BP and make sure she and her sister have meals. Her pharmacy sets up her medications weekly, her sister makes sure she takes them regularly. She states she does not take anything daily. [name] denies any seizures or seizure-like activity. She is on Dilantin 100mg  TID without side effects. She has not witnessed staring/unresponsive episodes, jerking, focal weakness. She denies any headaches, dizziness, focal numbness/tingling. She feels tired. Sleep is okay. She states she has problems with remembering. She is independent with dressing and bathing. She is on Donepezil 5mg  daily without side effects.    History on Initial Assessment 06/30/2018: This is an 86 year old right-handed woman with a history of  hypertension, atrial fibrillation on chronic anticoagulation with Eliquis, CHF, presenting for evaluation of seizure. She has had recurrent episodes of confusion since 2017, EEGs in the past were unrevealing. Her son had witnessed staring spells lasting a few minutes. She had another confusional episode in January 2020 and was noted to have aphasia. MRI brain did not show any acute changes, there was severe chronic microvascular disease and old small cerebellar infarcts, marked global atrophy. EEG unremarkable. She had marked improvement in symptoms and was discharged home on Levetiracetam 500mg  BID. She was back in the hospital in February 2020 for left-sided weakness, EEG showed right hemisphere slowing. There is note of limb jerking. Levetiracetam increased to 750mg  BID. She was very drowsy on the LEV and was switched to Phenytoin, with improvement in sedation. She has been taking Phenytoin 100mg  TID without side effects. Due to interaction with Eliquis, she was switched to a different anticoagulant Edoxaban. She has been living with her sister since hospital discharge in February who now manages her medications. They deny any further seizures since February 2020. He and her sister have not seen any further staring spells. She denies any olfactory/gustatory hallucinations, deja vu, rising epigastric sensation, focal numbness/tingling/weakness, myoclonic jerks. When asked about her memory, she states "I don't know, is that good or not." Her son reports memory changes started several years ago, worsening each year. She remembers things like her birthday, but she cannot keep up with dates and gets confused with her TV remote control and phone. She was started on Donepezil in February and he feels it has helped her. She stopped driving a year ago. Her son has  managed finances since her husband passed away a year ago. She is independent with dressing and bathing. A sitter comes 3 days a week to help. Son denies any  hallucinations or paranoia. She states that sometimes she "can't clear things out of my mind, I'm not worried, just a whole lot been happening." She reports sleep difficulties, "something to take the edge off." No wandering behavior.   Epilepsy Risk Factors:  Her brother and sister had seizures. Otherwise she had a normal birth and early development.  There is no history of febrile convulsions, CNS infections such as meningitis/encephalitis, significant traumatic brain injury, neurosurgical procedures  MEDICATIONS: Current Outpatient Medications on File Prior to Visit  Medication Sig Dispense Refill  . Acetaminophen (TYLENOL) 325 MG CAPS Take 650 mg by mouth every 6 (six) hours as needed for mild pain.    Marland Kitchen ARTIFICIAL TEAR OP Apply 2 drops to eye daily as needed (Dryness). To the left eye    . atorvastatin (LIPITOR) 20 MG tablet TAKE ONE TABLET AT BEDTIME 90 tablet 0  . calcium carbonate (OS-CAL) 1250 (500 Ca) MG chewable tablet Chew 1 tablet by mouth daily.    . clotrimazole-betamethasone (LOTRISONE) cream Apply 1 application topically 2 (two) times daily. To affected areas until rash clears 45 g 1  . digoxin (LANOXIN) 0.125 MG tablet Take 1 tablet (0.125 mg total) by mouth daily. (Patient taking differently: Take 0.125 mg by mouth every morning. ) 90 tablet 3  . diltiazem (CARDIZEM CD) 240 MG 24 hr capsule Take 1 capsule (240 mg total) by mouth daily. 30 capsule 6  . donepezil (ARICEPT) 5 MG tablet TAKE ONE TABLET AT BEDTIME FOR MEMORY 90 tablet 3  . hydrALAZINE (APRESOLINE) 10 MG tablet Take 1 tablet (10 mg total) by mouth 2 (two) times daily. 180 tablet 0  . levothyroxine (SYNTHROID) 50 MCG tablet TAKE 1 TABLET DAILY BEFORE BREAKFAST 90 tablet 0  . loperamide (LOPERAMIDE A-D) 2 MG tablet Take 1 tablet (2 mg total) by mouth 4 (four) times daily as needed for diarrhea or loose stools. 120 tablet 3  . metoprolol succinate (TOPROL-XL) 100 MG 24 hr tablet Take 1 tablet by mouth daily. Take with or  immediatelyfollowing a meal. 90 tablet 0  . Multiple Vitamin (MULTIVITAMIN WITH MINERALS) TABS tablet Take 1 tablet by mouth every morning.    . nitrofurantoin, macrocrystal-monohydrate, (MACROBID) 100 MG capsule Take 1 capsule (100 mg total) by mouth 2 (two) times daily. 1 po BId 14 capsule 0  . phenytoin (DILANTIN) 100 MG ER capsule Take 1 capsule (100 mg total) by mouth 3 (three) times daily. 270 capsule 3  . SAVAYSA 30 MG TABS tablet TAKE 1 TABLET ONCE A DAY FOR AFIB 90 tablet 2  . tamsulosin (FLOMAX) 0.4 MG CAPS capsule TAKE (1) CAPSULE DAILY 90 capsule 0   No current facility-administered medications on file prior to visit.      Observations/Objective:  Vitals:   01/10/19 0948  BP: (!) 148/93  Pulse: 80  Weight: 121 lb (54.9 kg)  Height: 5\' 2"  (1.575 m)   Limited due to nature of phone visit. Patient is awake, alert, able to answer questions without dysarthria.   Assessment and Plan:  This is an 86 yo RH woman with a history of hypertension, atrial fibrillation on chronic anticoagulation with Eliquis, CHF, dementia, and seizures. 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 MRI brain no acute changes, severe  chronic microvascular disease, EEG had shown right hemisphere slowing. No further seizures since February 2020 on Dilantin 100mg  TID. She is on Donepezil 5mg  daily for dementia. Continue 24/7 supervision. She does not drive. Follow-up in 6 months, they know to call for any changes.    Follow Up Instructions:    -I discussed the assessment and treatment plan with the patient. The patient was provided an opportunity to ask questions and all were answered. The patient agreed with the plan and demonstrated an understanding of the instructions.   The patient was advised to call back or seek an in-person evaluation if the symptoms worsen or if the condition fails to improve  as anticipated.    Total Time spent in visit with the patient was:  6:19 minutes, of which 100% of the time was spent in counseling and/or coordinating care on the above.   Pt understands and agrees with the plan of care outlined.     Cameron Sprang, MD

## 2019-01-18 ENCOUNTER — Other Ambulatory Visit: Payer: Self-pay | Admitting: Family Medicine

## 2019-01-18 ENCOUNTER — Other Ambulatory Visit: Payer: Self-pay | Admitting: Cardiology

## 2019-01-18 MED ORDER — PHENYTOIN SODIUM EXTENDED 100 MG PO CAPS: 100.0000 mg | ORAL_CAPSULE | Freq: Three times a day (TID) | ORAL | 3 refills | Status: DC

## 2019-01-18 MED ORDER — DONEPEZIL HCL 5 MG PO TABS: ORAL_TABLET | ORAL | 3 refills | Status: DC

## 2019-01-22 ENCOUNTER — Other Ambulatory Visit: Payer: Self-pay | Admitting: Family Medicine

## 2019-01-30 ENCOUNTER — Other Ambulatory Visit: Payer: Self-pay | Admitting: Family Medicine

## 2019-01-31 ENCOUNTER — Encounter: Payer: Self-pay | Admitting: Family Medicine

## 2019-01-31 ENCOUNTER — Other Ambulatory Visit: Payer: Self-pay | Admitting: Family Medicine

## 2019-01-31 ENCOUNTER — Ambulatory Visit (INDEPENDENT_AMBULATORY_CARE_PROVIDER_SITE_OTHER): Payer: Medicare Other | Admitting: Family Medicine

## 2019-01-31 ENCOUNTER — Other Ambulatory Visit: Payer: Self-pay

## 2019-01-31 VITALS — BP 145/75 | HR 71 | Temp 97.1°F | Ht 62.0 in | Wt 123.0 lb

## 2019-01-31 DIAGNOSIS — L409 Psoriasis, unspecified: Secondary | ICD-10-CM | POA: Diagnosis not present

## 2019-01-31 MED ORDER — BETAMETHASONE DIPROPIONATE 0.05 % EX CREA: TOPICAL_CREAM | Freq: Every day | CUTANEOUS | 0 refills | Status: DC

## 2019-01-31 NOTE — Progress Notes (Signed)
Assessment & Plan:  1. Scalp psoriasis - I did have Dr. Livia Snellen take a look a patient's scalp with me. I have prescribed a steroid cream to apply daily to the erythematous areas that are also tender to the touch.  I made sure her caregiver was able to see the areas and understand the plan since patient does have dementia.  Patient gets her hair washed by a hairdresser so I did not think it was appropriate to give her a shampoo as she would not be using it very often. - betamethasone dipropionate 0.05 % cream; Apply topically daily.  Dispense: 45 g; Refill: 0   Follow up plan: Return if symptoms worsen or fail to improve.  Hendricks Limes, MSN, APRN, FNP-C [facility]  Subjective:   Patient ID: [name], female    DOB: 1932/10/16, 86 y.o.   MRN: [id number]  HPI: [name] is a 86 y.o. female presenting on 01/31/2019 for Head Injury (cant remmber how she hurt head, but it is sore started last week )  Patient complains of head pain that started 5 days ago.  She does not remember how she hurt her head.  She does not recall falling or hitting her head on anything.  She does have a history of dementia.  Her caregiver is with her today and suspects she had her head in the refrigerator and raised it up before pulling out hitting it on the shelf, but no one saw this happen.   ROS: Negative unless specifically indicated above in HPI.   Relevant past medical history reviewed and updated as indicated.   Allergies and medications reviewed and updated.   Current Outpatient Medications:  .  Acetaminophen (TYLENOL) 325 MG CAPS, Take 650 mg by mouth every 6 (six) hours as needed for mild pain., Disp: , Rfl:  .  ARTIFICIAL TEAR OP, Apply 2 drops to eye daily as needed (Dryness). To the left eye, Disp: , Rfl:  .  atorvastatin (LIPITOR) 20 MG tablet, TAKE ONE TABLET AT BEDTIME, Disp: 90 tablet, Rfl: 0 .  calcium carbonate (OS-CAL) 1250 (500 Ca) MG chewable tablet, Chew 1  tablet by mouth daily., Disp: , Rfl:  .  clotrimazole-betamethasone (LOTRISONE) cream, Apply topically 2 times daily to affected areas until rash clears, Disp: 45 g, Rfl: 0 .  digoxin (DIGOX) 0.125 MG tablet, Take 1 tablet (0.125 mg total) by mouth daily., Disp: 60 tablet, Rfl: 0 .  diltiazem (CARDIZEM CD) 240 MG 24 hr capsule, Take 1 capsule (240 mg total) by mouth daily., Disp: 30 capsule, Rfl: 6 .  donepezil (ARICEPT) 5 MG tablet, TAKE ONE TABLET AT BEDTIME FOR MEMORY, Disp: 90 tablet, Rfl: 3 .  GNP MELATONIN MAXIMUM STRENGTH 5 MG TABS, TAKE ONE TABLET AT BEDTIME, Disp: 30 tablet, Rfl: 0 .  hydrALAZINE (APRESOLINE) 10 MG tablet, Take 1 tablet (10 mg total) by mouth 2 (two) times daily., Disp: 180 tablet, Rfl: 0 .  levothyroxine (SYNTHROID) 50 MCG tablet, TAKE 1 TABLET DAILY BEFORE BREAKFAST, Disp: 90 tablet, Rfl: 0 .  loperamide (LOPERAMIDE A-D) 2 MG tablet, Take 1 tablet (2 mg total) by mouth 4 (four) times daily as needed for diarrhea or loose stools., Disp: 120 tablet, Rfl: 3 .  metoprolol succinate (TOPROL-XL) 100 MG 24 hr tablet, Take 1 tablet by mouth daily. Take with or immediatelyfollowing a meal., Disp: 90 tablet, Rfl: 0 .  Multiple Vitamin (MULTIVITAMIN WITH MINERALS) TABS tablet, Take 1 tablet by mouth every morning.,  Disp: , Rfl:  .  phenytoin (DILANTIN) 100 MG ER capsule, Take 1 capsule (100 mg total) by mouth 3 (three) times daily., Disp: 270 capsule, Rfl: 3 .  SAVAYSA 30 MG TABS tablet, TAKE 1 TABLET ONCE A DAY FOR AFIB, Disp: 90 tablet, Rfl: 2 .  tamsulosin (FLOMAX) 0.4 MG CAPS capsule, TAKE (1) CAPSULE DAILY, Disp: 90 capsule, Rfl: 0 .  traZODone (DESYREL) 50 MG tablet, TAKE 1/2 TO 1 TABLET AT BEDTIME AS NEEDED FOR SLEEP, Disp: 30 tablet, Rfl: 0 .  betamethasone dipropionate 0.05 % cream, Apply topically daily., Disp: 45 g, Rfl: 0  Allergies  Allergen Reactions  . Penicillins Rash and Other (See Comments)    Has patient had a PCN reaction causing immediate rash,  facial/tongue/throat swelling, SOB or lightheadedness with hypotension: No Has patient had a PCN reaction causing severe rash involving mucus membranes or skin necrosis: No Has patient had a PCN reaction that required hospitalization No Has patient had a PCN reaction occurring within the last 10 years: No If all of the above answers are "NO", then may proceed with Cephalosporin use.    Objective:   BP (!) 145/75   Pulse 71   Temp (!) 97.1 F (36.2 C) (Temporal)   Ht 5\' 2"  (1.575 m)   Wt 123 lb (55.8 kg)   SpO2 96%   BMI 22.50 kg/m    Physical Exam Vitals reviewed.  Constitutional:      General: She is not in acute distress.    Appearance: Normal appearance. She is normal weight. She is not ill-appearing, toxic-appearing or diaphoretic.  HENT:     Head: Normocephalic and atraumatic.  Eyes:     General: No scleral icterus.       Right eye: No discharge.        Left eye: No discharge.     Conjunctiva/sclera: Conjunctivae normal.  Cardiovascular:     Rate and Rhythm: Normal rate.  Pulmonary:     Effort: Pulmonary effort is normal. No respiratory distress.  Musculoskeletal:        General: Normal range of motion.     Cervical back: Normal range of motion.  Skin:    General: Skin is warm and dry.     Capillary Refill: Capillary refill takes less than 2 seconds.     Findings: Erythema (in patches with white spots in them to the back and left side of patients head. She is tender in each of these areas.) present.  Neurological:     General: No focal deficit present.     Mental Status: She is alert and oriented to person, place, and time. Mental status is at baseline.  Psychiatric:        Mood and Affect: Mood normal.        Behavior: Behavior normal.        Thought Content: Thought content normal.        Judgment: Judgment normal.

## 2019-02-05 ENCOUNTER — Other Ambulatory Visit: Payer: Self-pay

## 2019-02-05 ENCOUNTER — Encounter: Payer: Self-pay | Admitting: Family Medicine

## 2019-02-05 ENCOUNTER — Ambulatory Visit (INDEPENDENT_AMBULATORY_CARE_PROVIDER_SITE_OTHER): Payer: Medicare Other | Admitting: Family Medicine

## 2019-02-05 VITALS — BP 146/78 | HR 67 | Temp 98.4°F | Ht 62.0 in | Wt 120.6 lb

## 2019-02-05 DIAGNOSIS — I10 Essential (primary) hypertension: Secondary | ICD-10-CM | POA: Diagnosis not present

## 2019-02-05 DIAGNOSIS — I4892 Unspecified atrial flutter: Secondary | ICD-10-CM | POA: Diagnosis not present

## 2019-02-05 DIAGNOSIS — F03A0 Unspecified dementia, mild, without behavioral disturbance, psychotic disturbance, mood disturbance, and anxiety: Secondary | ICD-10-CM

## 2019-02-05 DIAGNOSIS — F0390 Unspecified dementia without behavioral disturbance: Secondary | ICD-10-CM | POA: Diagnosis not present

## 2019-02-05 DIAGNOSIS — I480 Paroxysmal atrial fibrillation: Secondary | ICD-10-CM | POA: Diagnosis not present

## 2019-02-05 DIAGNOSIS — E039 Hypothyroidism, unspecified: Secondary | ICD-10-CM

## 2019-02-05 DIAGNOSIS — E785 Hyperlipidemia, unspecified: Secondary | ICD-10-CM | POA: Diagnosis not present

## 2019-02-05 DIAGNOSIS — L858 Other specified epidermal thickening: Secondary | ICD-10-CM

## 2019-02-05 NOTE — Progress Notes (Signed)
BP (!) 146/78   Pulse 67   Temp 98.4 F (36.9 C) (Temporal)   Ht 5' 2"  (1.575 m)   Wt 120 lb 9.6 oz (54.7 kg)   SpO2 96%   BMI 22.06 kg/m    Subjective:   Patient ID: [name], female    DOB: [birth year], 86 y.o.   MRN: [id number]  HPI: [name] is a 86 y.o. female presenting on 02/05/2019 for Medical Management of Chronic Issues (3 month follow up of chronic medical conditoins)   HPI Patient has a skin growth on the back of her left hand but is hard and very firm, recommended to go to dermatology for her  Hypothyroidism recheck Patient is coming in for thyroid recheck today as well. They deny any issues with hair changes or heat or cold problems or diarrhea or constipation. They deny any chest pain or palpitations. They are currently on levothyroxine 79mcrograms   Hyperlipidemia Patient is coming in for recheck of his hyperlipidemia. The patient is currently taking atorvastatin. They deny any issues with myalgias or history of liver damage from it. They deny any focal numbness or weakness or chest pain.   Hypertension Patient is currently on digoxin and diltiazem and hydralazine and metoprolol, and their blood pressure today is 146/78. Patient denies any lightheadedness or dizziness. Patient denies headaches, blurred vision, chest pains, shortness of breath, or weakness. Denies any side effects from medication and is content with current medication.   Patient has mild dementia this is that the medicine seems to be stabilizing her, she is currently on Aricept, they said she has ups and downs but it stable.  A. fib and a flutter Patient is currently on Savaysa Reason on anticoagulation: A. fib and a flutter Patient denies any bruising or bleeding or chest pain or palpitations   Relevant past medical, surgical, family and social history reviewed and updated as indicated. Interim medical history since our last visit reviewed. Allergies and medications reviewed and  updated.  Review of Systems  Constitutional: Negative for chills and fever.  Eyes: Negative for visual disturbance.  Respiratory: Negative for chest tightness and shortness of breath.   Cardiovascular: Negative for chest pain and leg swelling.  Genitourinary: Negative for difficulty urinating and dysuria.  Musculoskeletal: Negative for back pain and gait problem.  Skin: Positive for rash.  Neurological: Negative for light-headedness and headaches.  Psychiatric/Behavioral: Negative for agitation and behavioral problems.  All other systems reviewed and are negative.   Per HPI unless specifically indicated above   Allergies as of 02/05/2019      Reactions   Penicillins Rash, Other (See Comments)   Has patient had a PCN reaction causing immediate rash, facial/tongue/throat swelling, SOB or lightheadedness with hypotension: No Has patient had a PCN reaction causing severe rash involving mucus membranes or skin necrosis: No Has patient had a PCN reaction that required hospitalization No Has patient had a PCN reaction occurring within the last 10 years: No If all of the above answers are "NO", then may proceed with Cephalosporin use.      Medication List       Accurate as of February 05, 2019  2:22 PM. If you have any questions, ask your nurse or doctor.        ARTIFICIAL TEAR OP Apply 2 drops to eye daily as needed (Dryness). To the left eye   atorvastatin 20 MG tablet Commonly known as: LIPITOR TAKE ONE TABLET AT BEDTIME   betamethasone dipropionate 0.05 %  cream Apply topically daily.   calcium carbonate 1250 (500 Ca) MG chewable tablet Commonly known as: OS-CAL Chew 1 tablet by mouth daily.   clotrimazole-betamethasone cream Commonly known as: LOTRISONE Apply topically 2 times daily to affected areas until rash clears   digoxin 0.125 MG tablet Commonly known as: Digox Take 1 tablet (0.125 mg total) by mouth daily.   diltiazem 240 MG 24 hr capsule Commonly known as:  CARDIZEM CD Take 1 capsule (240 mg total) by mouth daily.   donepezil 5 MG tablet Commonly known as: ARICEPT TAKE ONE TABLET AT BEDTIME FOR MEMORY   GNP Melatonin Maximum Strength 5 MG Tabs Generic drug: Melatonin TAKE ONE TABLET AT BEDTIME   hydrALAZINE 10 MG tablet Commonly known as: APRESOLINE Take 1 tablet (10 mg total) by mouth 2 (two) times daily.   levothyroxine 50 MCG tablet Commonly known as: SYNTHROID TAKE 1 TABLET DAILY BEFORE BREAKFAST   loperamide 2 MG tablet Commonly known as: Loperamide A-D Take 1 tablet (2 mg total) by mouth 4 (four) times daily as needed for diarrhea or loose stools.   metoprolol succinate 100 MG 24 hr tablet Commonly known as: TOPROL-XL Take 1 tablet by mouth daily. Take with or immediatelyfollowing a meal.   multivitamin with minerals Tabs tablet Take 1 tablet by mouth every morning.   phenytoin 100 MG ER capsule Commonly known as: DILANTIN Take 1 capsule (100 mg total) by mouth 3 (three) times daily.   Savaysa 30 MG Tabs tablet Generic drug: edoxaban TAKE 1 TABLET ONCE A DAY FOR AFIB   tamsulosin 0.4 MG Caps capsule Commonly known as: FLOMAX TAKE (1) CAPSULE DAILY   traZODone 50 MG tablet Commonly known as: DESYREL TAKE 1/2 TO 1 TABLET AT BEDTIME AS NEEDED FOR SLEEP   Tylenol 325 MG Caps Generic drug: Acetaminophen Take 650 mg by mouth every 6 (six) hours as needed for mild pain.        Objective:   BP (!) 146/78   Pulse 67   Temp 98.4 F (36.9 C) (Temporal)   Ht 5' 2"  (1.575 m)   Wt 120 lb 9.6 oz (54.7 kg)   SpO2 96%   BMI 22.06 kg/m   Wt Readings from Last 3 Encounters:  02/05/19 120 lb 9.6 oz (54.7 kg)  01/31/19 123 lb (55.8 kg)  01/10/19 121 lb (54.9 kg)    Physical Exam Vitals and nursing note reviewed.  Constitutional:      General: She is not in acute distress.    Appearance: She is well-developed. She is not diaphoretic.  Eyes:     Conjunctiva/sclera: Conjunctivae normal.  Cardiovascular:      Rate and Rhythm: Normal rate and regular rhythm.     Heart sounds: Normal heart sounds. No murmur.  Pulmonary:     Effort: Pulmonary effort is normal. No respiratory distress.     Breath sounds: Normal breath sounds. No wheezing.  Musculoskeletal:        General: No tenderness. Normal range of motion.  Skin:    General: Skin is warm and dry.     Findings: Lesion (Cutaneous:, Small on posterior left wrist, slightly raised, 0.1 cm in size, no erythema) present. No erythema or rash.  Neurological:     Mental Status: She is alert and oriented to person, place, and time.     Coordination: Coordination normal.  Psychiatric:        Behavior: Behavior normal.       Assessment & Plan:   Problem  List Items Addressed This Visit      Cardiovascular and Mediastinum   Paroxysmal atrial fibrillation ([place]) - Primary   Relevant Orders   CBC with Differential/Platelet   HTN (hypertension)   Relevant Orders   CMP14+EGFR   Atrial flutter ([place])   Relevant Orders   CBC with Differential/Platelet     Endocrine   Hypothyroidism   Relevant Orders   TSH     Nervous and Auditory   Mild dementia ([place])   Relevant Orders   CBC with Differential/Platelet     Other   Dyslipidemia   Relevant Orders   Lipid panel    Other Visit Diagnoses    Cutaneous horn       Recommended to go to dermatology    Continue current medication, seems to be doing okay, will check blood work today.  Patient has a cutaneous horn on the posterior aspect of her left wrist and she will call up her dermatologist  Follow up plan: Return in about 3 months (around 05/05/2019), or if symptoms worsen or fail to improve, for Thyroid and hypertension recheck.  Counseling provided for all of the vaccine components Orders Placed This Encounter  Procedures  . CBC with Differential/Platelet  . CMP14+EGFR  . Lipid panel  . TSH    Caryl Pina, MD [facility] 02/05/2019, 2:22 PM

## 2019-02-06 LAB — LIPID PANEL
Chol/HDL Ratio: 1.9 ratio (ref 0.0–4.4)
Cholesterol, Total: 177 mg/dL (ref 100–199)
HDL: 94 mg/dL (ref >39)
LDL Chol Calc (NIH): 62 mg/dL (ref 0–99)
Triglycerides: 122 mg/dL (ref 0–149)
VLDL Cholesterol Cal: 21 mg/dL (ref 5–40)

## 2019-02-06 LAB — CMP14+EGFR
ALT: 16 IU/L (ref 0–32)
AST: 18 IU/L (ref 0–40)
Albumin/Globulin Ratio: 1.6 (ref 1.2–2.2)
Albumin: 4.2 g/dL (ref 3.6–4.6)
Alkaline Phosphatase: 86 IU/L (ref 39–117)
BUN/Creatinine Ratio: 14 (ref 12–28)
BUN: 11 mg/dL (ref 8–27)
Bilirubin Total: <0.2 mg/dL (ref 0.0–1.2)
CO2: 28 mmol/L (ref 20–29)
Calcium: 9.7 mg/dL (ref 8.7–10.3)
Chloride: 93 mmol/L — ABNORMAL LOW (ref 96–106)
Creatinine, Ser: 0.78 mg/dL (ref 0.57–1.00)
GFR calc Af Amer: 81 mL/min/{1.73_m2} (ref >59)
GFR calc non Af Amer: 70 mL/min/{1.73_m2} (ref >59)
Globulin, Total: 2.6 g/dL (ref 1.5–4.5)
Glucose: 103 mg/dL — ABNORMAL HIGH (ref 65–99)
Potassium: 4 mmol/L (ref 3.5–5.2)
Sodium: 137 mmol/L (ref 134–144)
Total Protein: 6.8 g/dL (ref 6.0–8.5)

## 2019-02-06 LAB — CBC WITH DIFFERENTIAL/PLATELET
Basophils Absolute: 0.1 10*3/uL (ref 0.0–0.2)
Basos: 1 %
EOS (ABSOLUTE): 0.1 10*3/uL (ref 0.0–0.4)
Eos: 1 %
Hematocrit: 42 % (ref 34.0–46.6)
Hemoglobin: 14.7 g/dL (ref 11.1–15.9)
Immature Grans (Abs): 0.1 10*3/uL (ref 0.0–0.1)
Immature Granulocytes: 1 %
Lymphocytes Absolute: 1.8 10*3/uL (ref 0.7–3.1)
Lymphs: 24 %
MCH: 32.9 pg (ref 26.6–33.0)
MCHC: 35 g/dL (ref 31.5–35.7)
MCV: 94 fL (ref 79–97)
Monocytes Absolute: 0.9 10*3/uL (ref 0.1–0.9)
Monocytes: 12 %
Neutrophils Absolute: 4.7 10*3/uL (ref 1.4–7.0)
Neutrophils: 61 %
Platelets: 347 10*3/uL (ref 150–450)
RBC: 4.47 x10E6/uL (ref 3.77–5.28)
RDW: 12.5 % (ref 11.7–15.4)
WBC: 7.7 10*3/uL (ref 3.4–10.8)

## 2019-02-06 LAB — TSH: TSH: 5.37 u[IU]/mL — ABNORMAL HIGH (ref 0.450–4.500)

## 2019-02-12 DIAGNOSIS — L218 Other seborrheic dermatitis: Secondary | ICD-10-CM | POA: Diagnosis not present

## 2019-02-12 DIAGNOSIS — C44622 Squamous cell carcinoma of skin of right upper limb, including shoulder: Secondary | ICD-10-CM | POA: Diagnosis not present

## 2019-02-12 DIAGNOSIS — L570 Actinic keratosis: Secondary | ICD-10-CM | POA: Diagnosis not present

## 2019-02-12 DIAGNOSIS — L309 Dermatitis, unspecified: Secondary | ICD-10-CM | POA: Diagnosis not present

## 2019-02-12 DIAGNOSIS — B079 Viral wart, unspecified: Secondary | ICD-10-CM | POA: Diagnosis not present

## 2019-02-15 ENCOUNTER — Other Ambulatory Visit: Payer: Self-pay | Admitting: Family Medicine

## 2019-02-15 ENCOUNTER — Other Ambulatory Visit: Payer: Self-pay | Admitting: Cardiology

## 2019-02-16 ENCOUNTER — Ambulatory Visit: Payer: Medicare Other | Admitting: Family

## 2019-02-18 ENCOUNTER — Ambulatory Visit: Payer: Medicare Other | Attending: Internal Medicine

## 2019-02-18 DIAGNOSIS — Z23 Encounter for immunization: Secondary | ICD-10-CM

## 2019-02-18 NOTE — Progress Notes (Signed)
   Covid-19 Vaccination Clinic  Name:  [name]    MRN: [id number] DOB: July 14, 1932  02/18/2019  [name] was observed post Covid-19 immunization for 15 minutes without incidence. She was provided with Vaccine Information Sheet and instruction to access the V-Safe system.   [name] was instructed to call 911 with any severe reactions post vaccine: Marland Kitchen Difficulty breathing  . Swelling of your face and throat  . A fast heartbeat  . A bad rash all over your body  . Dizziness and weakness    Immunizations Administered    Name Date Dose VIS Date Route   Pfizer COVID-19 Vaccine 02/18/2019 11:07 AM 0.3 mL 12/15/2018 Intramuscular   Manufacturer: [place]   Lot: [id number]   [place]: [id number]

## 2019-02-20 ENCOUNTER — Ambulatory Visit (INDEPENDENT_AMBULATORY_CARE_PROVIDER_SITE_OTHER): Payer: Medicare Other

## 2019-02-20 DIAGNOSIS — Z0001 Encounter for general adult medical examination with abnormal findings: Secondary | ICD-10-CM

## 2019-02-20 DIAGNOSIS — F0390 Unspecified dementia without behavioral disturbance: Secondary | ICD-10-CM | POA: Diagnosis not present

## 2019-02-20 DIAGNOSIS — Z0000 Encounter for general adult medical examination without abnormal findings: Secondary | ICD-10-CM

## 2019-02-20 DIAGNOSIS — F03A0 Unspecified dementia, mild, without behavioral disturbance, psychotic disturbance, mood disturbance, and anxiety: Secondary | ICD-10-CM

## 2019-02-20 NOTE — Progress Notes (Signed)
MEDICARE ANNUAL WELLNESS VISIT  02/20/2019  Telephone Visit Disclaimer This Medicare AWV was conducted by telephone due to national recommendations for restrictions regarding the COVID-19 Pandemic (e.g. social distancing).  I verified, using two identifiers, that I am speaking with [name] or their authorized healthcare agent. I discussed the limitations, risks, security, and privacy concerns of performing an evaluation and management service by telephone and the potential availability of an in-person appointment in the future. The patient expressed understanding and agreed to proceed.   Subjective:  [name] is a 86 y.o. female patient of Dettinger, Terri Kaufmann, MD who had a Medicare Annual Wellness Visit today via telephone. Soraya is Retired and lives with her sister. she has 1 child. she reports that she is not socially active and does interact with friends/family regularly. she is minimally physically active and enjoys watching television.  Patient Care Team: Dettinger, Terri Kaufmann, MD as PCP - General (Family Medicine) Minus Breeding, MD as PCP - Cardiology (Cardiology) Paralee Cancel, MD (Orthopedic Surgery) Rogene Houston, MD as Consulting Physician (Gastroenterology) Cameron Sprang, MD as Consulting Physician (Neurology)  Advanced Directives 02/20/2019 06/30/2018 04/06/2018 03/14/2018 03/01/2018 02/17/2018 01/30/2018  Does Patient Have a Medical Advance Directive? No Yes Yes Yes Yes No No  Type of Advance Directive - Healthcare Power of Attorney Living will;Healthcare Power of [place];Living will [place];Living will - -  Does patient want to make changes to medical advance directive? - - No - Guardian declined No - Patient declined No - Patient declined - -  Copy of [place] in Chart? - - - No - copy requested No - copy requested - -  Would patient like information on creating a medical advance directive? No  - Patient declined - No - Guardian declined - - No - Patient declined No - Patient declined  Pre-existing out of facility DNR order (yellow form or pink MOST form) - - - - - - -    Hospital Utilization Over the Past 12 Months: # of hospitalizations or ER visits: 0 # of surgeries: 0  Review of Systems    Patient reports that her overall health is unchanged compared to last year.  Patient Reported Readings (BP, Pulse, CBG, Weight, etc) none  Pain Assessment Pain : No/denies pain     Current Medications & Allergies (verified) Allergies as of 02/20/2019      Reactions   Penicillins Rash, Other (See Comments)   Has patient had a PCN reaction causing immediate rash, facial/tongue/throat swelling, SOB or lightheadedness with hypotension: No Has patient had a PCN reaction causing severe rash involving mucus membranes or skin necrosis: No Has patient had a PCN reaction that required hospitalization No Has patient had a PCN reaction occurring within the last 10 years: No If all of the above answers are "NO", then may proceed with Cephalosporin use.      Medication List       Accurate as of February 20, 2019 11:16 AM. If you have any questions, ask your nurse or doctor.        STOP taking these medications   betamethasone dipropionate 0.05 % cream Stopped by: Alphonzo Dublin, LPN     TAKE these medications   ARTIFICIAL TEAR OP Apply 2 drops to eye daily as needed ([place]ry[place]). To the left eye   atorvastatin 20 MG tablet Commonly known as: LIPITOR TAKE ONE TABLET AT BEDTIME   calcium  carbonate 1250 (500 Ca) MG chewable tablet Commonly known as: OS-CAL Chew 1 tablet by mouth daily.   clotrimazole-betamethasone cream Commonly known as: LOTRISONE Apply topically 2 times daily to affected areas until rash clears   digoxin 0.125 MG tablet Commonly known as: Digox Take 1 tablet (0.125 mg total) by mouth daily.   diltiazem 240 MG 24 hr capsule Commonly known as: CARDIZEM  CD Take 1 capsule (240 mg total) by mouth daily.   donepezil 5 MG tablet Commonly known as: ARICEPT TAKE ONE TABLET AT BEDTIME FOR MEMORY   GNP Melatonin Maximum Strength 5 MG Tabs Generic drug: Melatonin TAKE ONE TABLET AT BEDTIME   hydrALAZINE 10 MG tablet Commonly known as: APRESOLINE Take 1 tablet (10 mg total) by mouth 2 (two) times daily.   levothyroxine 50 MCG tablet Commonly known as: SYNTHROID TAKE 1 TABLET DAILY BEFORE BREAKFAST   loperamide 2 MG tablet Commonly known as: Loperamide A-D Take 1 tablet (2 mg total) by mouth 4 (four) times daily as needed for diarrhea or loose stools.   metoprolol succinate 100 MG 24 hr tablet Commonly known as: TOPROL-XL TAKE 1 TABLET EVERY DAY. Take with or immediately following a meal.   multivitamin with minerals Tabs tablet Take 1 tablet by mouth every morning.   phenytoin 100 MG ER capsule Commonly known as: DILANTIN Take 1 capsule (100 mg total) by mouth 3 (three) times daily.   Savaysa 30 MG Tabs tablet Generic drug: edoxaban TAKE 1 TABLET ONCE A DAY FOR AFIB   tamsulosin 0.4 MG Caps capsule Commonly known as: FLOMAX TAKE (1) CAPSULE DAILY   traZODone 50 MG tablet Commonly known as: DESYREL TAKE 1/2 TO 1 TABLET AT BEDTIME AS NEEDED FOR SLEEP   Tylenol 325 MG Caps Generic drug: Acetaminophen Take 650 mg by mouth every 6 (six) hours as needed for mild pain.       History (reviewed): Past Medical History:  Diagnosis Date  . Anemia   . Atrial fibrillation (HCC)    Paroxysmal, Flecainide therapy  . Calf pain    September, 2012, at rest  . Carotid bruit    Doppler, December, 2009, no abnormality  . Cataract   . Diverticulosis   . GERD (gastroesophageal reflux disease)   . History of shingles   . HLD (hyperlipidemia)   . HTN (hypertension)   . Hypothyroidism   . Insomnia   . Osteoarthritis   . PONV (postoperative nausea and vomiting)   . Rectal bleeding 2001   diverticulosis and int hemorrhoids on  07/1999 and 02/2010 colonoscopies.  . Seizures ([place])   . Stroke ([place])   . Warfarin anticoagulation    Past Surgical History:  Procedure Laterality Date  . BIOPSY  09/29/2015   Procedure: BIOPSY;  Surgeon: Rogene Houston, MD;  [facility];  Service: Endoscopy;;  gastric  . CATARACT EXTRACTION W/PHACO Right 04/15/2014   Procedure: CATARACT EXTRACTION PHACO AND INTRAOCULAR LENS PLACEMENT (IOC);  Surgeon: Tonny Branch, MD;  Location: [facility];  Service: Ophthalmology;  Laterality: Right;  CDE: 13.30  . CATARACT EXTRACTION W/PHACO Left 05/13/2014   Procedure: CATARACT EXTRACTION PHACO AND INTRAOCULAR LENS PLACEMENT LEFT EYE;  Surgeon: Tonny Branch, MD;  Location: [facility];  Service: Ophthalmology;  Laterality: Left;  CDE:13.00  . CHOLECYSTECTOMY N/A 01/15/2015   Procedure: LAPAROSCOPIC CHOLECYSTECTOMY;  Surgeon: Ralene Ok, MD;  Location: [place];  Service: General;  Laterality: N/A;  . COLONOSCOPY N/A 09/29/2015   Procedure: COLONOSCOPY;  Surgeon: Rogene Houston, MD;  [facility];  Service: Endoscopy;  Laterality: N/A;  . COLONOSCOPY    . ESOPHAGOGASTRODUODENOSCOPY N/A 01/13/2015   Procedure: ESOPHAGOGASTRODUODENOSCOPY (EGD);  Surgeon: Jerene Bears, MD;  Location[facility] END[facility];  Service: Endoscopy;  Laterality: N/A;  . ESOPHAGOGASTRODUODENOSCOPY N/A 09/29/2015   Procedure: ESOPHAGOGASTRODUODENOSCOPY (EGD);  Surgeon: Rogene Houston, MD;  [facility];  Service: Endoscopy;  Laterality: N/A;  12:15  . GALLBLADDER SURGERY    . LEFT HEART CATH AND CORONARY ANGIOGRAPHY N/A 06/22/2016   Procedure: Left Heart Cath and Coronary Angiography;  Surgeon: Troy Sine, MD;  Location: [place] [facility];  Service: Cardiovascular;  Laterality: N/A;  . SHOULDER OPEN ROTATOR CUFF REPAIR     bilateral  . TOTAL KNEE ARTHROPLASTY  05/26/10   right  . TOTAL KNEE ARTHROPLASTY  11/17/2010   Procedure: TOTAL KNEE ARTHROPLASTY;  Surgeon: Mauri Pole;  Location: [facility];  Service:  Orthopedics;  Laterality: Left;  . TOTAL KNEE ARTHROPLASTY     Right  . TOTAL SHOULDER REPLACEMENT  01/2010   left   Family History  Problem Relation Age of Onset  . COPD Brother   . Atrial fibrillation Brother   . Diabetes Brother   . Liver cancer Brother        Alcohol-related  . Colon cancer Brother   . Heart attack Father   . Early death Father   . Heart disease Father   . Rheum arthritis Mother   . Arthritis Mother   . Heart disease Mother   . Coronary artery disease Brother   . Heart attack Daughter   . Gallbladder disease Brother    Social History   Socioeconomic History  . Marital status: Widowed    Spouse name: Not on file  . Number of children: 1  . Years of education: Not on file  . Highest education level: Not on file  Occupational History  . Occupation: retired    Comment: self - hair-dresser  Tobacco Use  . Smoking status: Never Smoker  . Smokeless tobacco: Never Used  Substance and Sexual Activity  . Alcohol use: No  . Drug use: No  . Sexual activity: Never  Other Topics Concern  . Not on file  Social History Narrative   Right handed    Split level. Only one step to go outside   Social Determinants of Health   Financial Resource Strain: Low Risk   . Difficulty of Paying Living Expenses: Not hard at all  Food Insecurity: No Food Insecurity  . Worried About Charity fundraiser in the Last Year: Never true  . Ran Out of Food in the Last Year: Never true  Transportation Needs:   . Lack of Transportation (Medical): Not on file  . Lack of Transportation (Non-Medical): Not on file  Physical Activity:   . Days of Exercise per Week: Not on file  . Minutes of Exercise per Session: Not on file  Stress: No Stress Concern Present  . Feeling of Stress : Only a little  Social Connections:   . Frequency of Communication with Friends and Family: Not on file  . Frequency of Social Gatherings with Friends and Family: Not on file  . Attends Religious  Services: Not on file  . Active Member of Clubs or Organizations: Not on file  . Attends Archivist Meetings: Not on file  . Marital Status: Not on file    Activities of Daily Living In your present state of health,  do you have any difficulty performing the following activities: 02/20/2019 04/17/2018  Hearing? Y N  Vision? N N  Difficulty concentrating or making decisions? Y Y  Comment - -  Walking or climbing stairs? N N  Dressing or bathing? N N  Comment - -  Doing errands, shopping? Y Y  Comment Pt not able to drive due to memory -  [employer] and eating ? N Y  Using the Toilet? N Y  In the past six months, have you accidently leaked urine? Y N  Do you have problems with loss of bowel control? Y N  Managing your Medications? Y Y  Comment - -  Managing your Finances? Y Y  Comment - -  Housekeeping or managing your Housekeeping? Y Y  Comment - -  Some recent data might be hidden    Patient Education/ Literacy How often do you need to have someone help you when you read instructions, pamphlets, or other written materials from your doctor or pharmacy?: 3 - Sometimes What is the last grade level you completed in school?: 12th grade  Exercise    Diet Patient reports consuming 3 meals a day and 0 snack(s) a day Patient reports that her primary diet is: Low fat Patient reports that she does have regular access to food.   Depression Screen PHQ 2/9 Scores 02/20/2019 02/05/2019 01/31/2019 04/17/2018 03/14/2018 02/17/2018 01/19/2018  PHQ - 2 Score 6 0 0 1 0 0 0  PHQ- 9 Score 12 - - 3 - - -     Fall Risk Fall Risk  02/05/2019 01/31/2019 01/10/2019 06/30/2018 05/08/2018  Falls in the past year? 1 0 0 0 0  Comment - - - - -  Number falls in past yr: 0 - 0 0 -  Comment - - - - -  Injury with Fall? 0 - 0 0 -  Comment - - - - -  Risk Factor Category  - - - - -  Comment - - - - -  Risk for fall due to : - - - - -  Follow up - - Falls evaluation completed - -  Comment - - - - -      Objective:  [name] seemed alert and oriented and she participated appropriately during our telephone visit.  Blood Pressure Weight BMI  BP Readings from Last 3 Encounters:  02/05/19 (!) 146/78  01/31/19 (!) 145/75  01/10/19 (!) 148/93   Wt Readings from Last 3 Encounters:  02/05/19 120 lb 9.6 oz (54.7 kg)  01/31/19 123 lb (55.8 kg)  01/10/19 121 lb (54.9 kg)   BMI Readings from Last 1 Encounters:  02/05/19 22.06 kg/m    *Unable to obtain current vital signs, weight, and BMI due to telephone visit type  Hearing/Vision  . Donnell did  seem to have difficulty with hearing/understanding during the telephone conversation . Reports that she has not had a formal eye exam by an eye care professional within the past year . Reports that she has not had a formal hearing evaluation within the past year *Unable to fully assess hearing and vision during telephone visit type  Cognitive Function: 6CIT Screen 02/20/2019  What Year? 4 points  What month? 3 points  What time? 0 points  Count back from 20 0 points  Months in reverse 4 points  Repeat phrase 4 points  Total Score 15   (Normal:0-7, Significant for Dysfunction: >8)  Normal Cognitive Function Screening: No   Immunization &  Health Maintenance Record Immunization History  Administered Date(s) Administered  . Influenza, High Dose Seasonal PF 10/06/2015, 10/15/2016, 10/11/2017  . Influenza,inj,quad, With Preservative 10/06/2018  . Influenza-Unspecified 10/04/2014, 10/09/2018  . PFIZER SARS-COV-2 Vaccination 02/18/2019  . Pneumococcal Conjugate-13 02/20/2015  . Pneumococcal Polysaccharide-23 01/31/2006  . Tdap 07/16/2013    Health Maintenance  Topic Date Due  . DEXA SCAN  02/20/2020 (Originally 02/02/2019)  . TETANUS/TDAP  07/17/2023  . INFLUENZA VACCINE  Completed  . PNA vac Low Risk Adult  Completed       Assessment  This is a routine wellness examination for [name].  Health Maintenance: Due or  Overdue There are no preventive care reminders to display for this patient.  [name] does need a referral for Community Assistance: Care Management:   no Social Work:    Yes Prescription Assistance:  no Nutrition/Diabetes Education:  no   Plan:  Personalized Goals  Exercise 30 minutes daily  Fall Prevention  Personalized Health Maintenance & Screening Recommendations  Patient is due for Dexa scan. She declines stating that she is 86 years old and does not want the test.  Lung Cancer Screening Recommended: no (Low Dose CT Chest recommended if Age 86-80 years, 30 pack-year currently smoking OR have quit w/in past 15 years) Hepatitis C Screening recommended: no HIV Screening recommended: no  Advanced Directives: Written information was not prepared per patient's request.  Referrals & Orders No orders of the defined types were placed in this encounter.   Follow-up Plan . Follow-up with Dettinger, Terri Kaufmann, MD as planned . Scheduled 05/09/2019    I have personally reviewed and noted the following in the patient's chart:   . Medical and social history . Use of alcohol, tobacco or illicit drugs  . Current medications and supplements . Functional ability and status . Nutritional status . Physical activity . Advanced directives . List of other physicians . Hospitalizations, surgeries, and ER visits in previous 12 months . Vitals . Screenings to include cognitive, depression, and falls . Referrals and appointments  In addition, I have reviewed and discussed with [name] certain preventive protocols, quality metrics, and best practice recommendations. A written personalized care plan for preventive services as well as general preventive health recommendations is available and can be mailed to the patient at her request.      Alphonzo Dublin, LPN  [phone]

## 2019-02-20 NOTE — Addendum Note (Signed)
Addended by: Alphonzo Dublin on: 02/20/2019 03:03 PM   Modules accepted: Level of Service

## 2019-02-22 ENCOUNTER — Ambulatory Visit: Payer: Self-pay | Admitting: Licensed Clinical Social Worker

## 2019-02-22 DIAGNOSIS — I4892 Unspecified atrial flutter: Secondary | ICD-10-CM

## 2019-02-22 DIAGNOSIS — I10 Essential (primary) hypertension: Secondary | ICD-10-CM

## 2019-02-22 DIAGNOSIS — F0390 Unspecified dementia without behavioral disturbance: Secondary | ICD-10-CM

## 2019-02-22 DIAGNOSIS — E039 Hypothyroidism, unspecified: Secondary | ICD-10-CM

## 2019-02-22 DIAGNOSIS — F03A0 Unspecified dementia, mild, without behavioral disturbance, psychotic disturbance, mood disturbance, and anxiety: Secondary | ICD-10-CM

## 2019-02-22 NOTE — Patient Instructions (Addendum)
Licensed Clinical Water engineer Provided: No  I reached out to [employer], son of client, by phone today.  Ms. [name], son, were given information about Chronic Care Management services today including:  1. CCM service includes personalized support from designated clinical staff supervised by her physician, including individualized plan of care and coordination with other care providers 2. 24/7 contact phone numbers for assistance for urgent and routine care needs. 3. Service will only be billed when office clinical staff spend 20 minutes or more in a month to coordinate care. 4. Only one practitioner may furnish and bill the service in a calendar month. 5. The patient may stop CCM services at any time (effective at the end of the month) by phone call to the office staff. 6. The patient will be responsible for cost sharing (co-pay) of up to 20% of the service fee (after annual deductible is met). Patient [name], son did not agree to services and wish to consider information provided before deciding about enrollment in care management services.    Rogers spoke via phone with [name], son of client, today to discuss CCM program services. Rogers described to [place]; and, Rogers described to [name] support through CCM program. Terri requested name of RNCM with CCM program so he could contact her to discuss nursing benefits through program for client. Rogers informed Terri that he could call [facility] and ask for [name] RNCM to call him to discuss CCM program nursing support. Rogers thanked [name] for phone call with Rogers on 02/22/2019  Follow Up Plan:  Rogers to call [name], son of client , in next 4 weeks to talk with [name] further about CCM program services  The patient /[name], son, verbalized understanding of instructions provided today and declined a print copy of patient instruction materials.    [name].Sinaya Minogue MSW, Rogers Licensed Clinical Social Worker [facility] Care Management [phone]

## 2019-02-22 NOTE — Chronic Care Management (AMB) (Signed)
  Care Management Note   [name] is a 85 y.o. year old female who is a primary care patient of Dettinger, Fransisca Kaufmann, MD. The CM team was consulted for assistance with chronic disease management and care coordination.   I reached out to [employer], son of client,  by phone today.   [name] [name], son, were given information about Chronic Care Management services today including:  1. CCM service includes personalized support from designated clinical staff supervised by her physician, including individualized plan of care and coordination with other care providers 2. 24/7 contact phone numbers for assistance for urgent and routine care needs. 3. Service will only be billed when office clinical staff spend 20 minutes or more in a month to coordinate care. 4. Only one practitioner may furnish and bill the service in a calendar month. 5. The patient may stop CCM services at any time (effective at the end of the month) by phone call to the office staff. 6. The patient will be responsible for cost sharing (co-pay) of up to 20% of the service fee (after annual deductible is met). Patient [name], son did not agree to services and wish to consider information provided before deciding about enrollment in care management services.    Review of patient status, including review of consultants reports, relevant laboratory and other test results, and collaboration with appropriate care team members and the patient's provider was performed as part of comprehensive patient evaluation and provision of chronic care management services.   Medications   (very important)  New medications from outside sources are available for reconciliation  Acetaminophen (TYLENOL) 325 MG CAPS ARTIFICIAL TEAR OP atorvastatin (LIPITOR) 20 MG tablet calcium carbonate (OS-CAL) 1250 (500 Ca) MG chewable tablet clotrimazole-betamethasone (LOTRISONE) cream digoxin (DIGOX) 0.125 MG tablet diltiazem  (CARDIZEM CD) 240 MG 24 hr capsule donepezil (ARICEPT) 5 MG tablet GNP MELATONIN MAXIMUM STRENGTH 5 MG TABS hydrALAZINE (APRESOLINE) 10 MG tablet levothyroxine (SYNTHROID) 50 MCG tablet loperamide (LOPERAMIDE A-D) 2 MG tablet metoprolol succinate (TOPROL-XL) 100 MG 24 hr tablet Multiple Vitamin (MULTIVITAMIN WITH MINERALS) TABS tablet phenytoin (DILANTIN) 100 MG ER capsule SAVAYSA 30 MG TABS tablet tamsulosin (FLOMAX) 0.4 MG CAPS capsule traZODone (DESYREL) 50 MG tablet  LCSW spoke via phone with [name], son of client, today to discuss CCM Rogers services. LCSW described to [name]; and, LCSW described to [name]. Terri requested name of RNCM with CCM Rogers so he could contact her to discuss nursing benefits through Rogers for client. LCSW informed Terri that he could call [facility] and ask for [name] RNCM to call him to discuss CCM Rogers nursing support. LCSW thanked [name] for phone call with LCSW on 02/22/2019  Follow Up Plan:  LCSW to call [name], son of client , in next 4 weeks to talk with [name] further about CCM Rogers services  [name].[name] MSW, LCSW Licensed Clinical Social Worker [facility] Care [facility] [phone]

## 2019-02-28 ENCOUNTER — Other Ambulatory Visit: Payer: Self-pay | Admitting: Family Medicine

## 2019-03-13 ENCOUNTER — Ambulatory Visit: Payer: Medicare Other | Attending: Internal Medicine

## 2019-03-13 DIAGNOSIS — Z23 Encounter for immunization: Secondary | ICD-10-CM | POA: Insufficient documentation

## 2019-03-13 NOTE — Progress Notes (Signed)
   Covid-19 Vaccination Clinic  Name:  [name]    MRN: [id number] DOB: 1932-08-27  03/13/2019  [name] was observed post Covid-19 immunization for 15 minutes without incident. She was provided with Vaccine Information Sheet and instruction to access the V-Safe system.   [name] was instructed to call 911 with any severe reactions post vaccine: Marland Kitchen Difficulty breathing  . Swelling of face and throat  . A fast heartbeat  . A bad rash all over body  . Dizziness and weakness   Immunizations Administered    Name Date Dose VIS Date Route   Pfizer COVID-19 Vaccine 03/13/2019 12:59 PM 0.3 mL 12/15/2018 Intramuscular   Manufacturer: [place]   Lot: [id number]   [place]: [id number]

## 2019-03-14 ENCOUNTER — Other Ambulatory Visit: Payer: Self-pay | Admitting: Family Medicine

## 2019-03-14 NOTE — Telephone Encounter (Signed)
Last office visit <3 months ago, 02/05/2019 Last refill 01/18/2019, #30, 0 refills

## 2019-03-16 ENCOUNTER — Other Ambulatory Visit: Payer: Self-pay

## 2019-03-16 ENCOUNTER — Encounter: Payer: Self-pay | Admitting: Family Medicine

## 2019-03-16 ENCOUNTER — Ambulatory Visit (INDEPENDENT_AMBULATORY_CARE_PROVIDER_SITE_OTHER): Payer: Medicare Other | Admitting: Family Medicine

## 2019-03-16 VITALS — BP 146/89 | HR 74 | Temp 99.6°F | Ht 62.0 in | Wt 121.8 lb

## 2019-03-16 DIAGNOSIS — H04552 Acquired stenosis of left nasolacrimal duct: Secondary | ICD-10-CM | POA: Diagnosis not present

## 2019-03-16 DIAGNOSIS — Z961 Presence of intraocular lens: Secondary | ICD-10-CM | POA: Diagnosis not present

## 2019-03-16 DIAGNOSIS — H04322 Acute dacryocystitis of left lacrimal passage: Secondary | ICD-10-CM | POA: Diagnosis not present

## 2019-03-16 MED ORDER — SULFAMETHOXAZOLE-TRIMETHOPRIM 800-160 MG PO TABS: 1.0000 | ORAL_TABLET | Freq: Two times a day (BID) | ORAL | 0 refills | Status: AC

## 2019-03-16 NOTE — Progress Notes (Signed)
Assessment & Plan:  1. Obstruction of left tear duct - Appointment made for patient at [facility] at 1 PM today. - Ambulatory referral to Ophthalmology - sulfamethoxazole-trimethoprim (BACTRIM DS) 800-160 MG tablet; Take 1 tablet by mouth 2 (two) times daily for 7 days.  Dispense: 14 tablet; Refill: 0   Follow up plan: Return if symptoms worsen or fail to improve.  Hendricks Limes, MSN, APRN, FNP-C [facility]  Subjective:   Patient ID: IZZIE CHARETTE, female    DOB: 11-24-1932, 86 y.o.   MRN: [id number]  HPI: ANITRIA ROSSMILLER is a 86 y.o. female presenting on 03/16/2019 for Facial Swelling (left eye swelling x 3 days.  Patient states that it is painful and swelling has spread. )  Patient reports swelling and pain under her left eye that has been gradually worsening over the past 3 days.  No home treatments.   ROS: Negative unless specifically indicated above in HPI.   Relevant past medical history reviewed and updated as indicated.   Allergies and medications reviewed and updated.   Current Outpatient Medications:  .  Acetaminophen (TYLENOL) 325 MG CAPS, Take 650 mg by mouth every 6 (six) hours as needed for mild pain., Disp: , Rfl:  .  ARTIFICIAL TEAR OP, Apply 2 drops to eye daily as needed (Dryness). To the left eye, Disp: , Rfl:  .  atorvastatin (LIPITOR) 20 MG tablet, TAKE ONE TABLET AT BEDTIME, Disp: 90 tablet, Rfl: 0 .  calcium carbonate (OS-CAL) 1250 (500 Ca) MG chewable tablet, Chew 1 tablet by mouth daily., Disp: , Rfl:  .  clotrimazole-betamethasone (LOTRISONE) cream, Apply topically 2 times daily to affected areas until rash clears, Disp: 45 g, Rfl: 0 .  digoxin (DIGOX) 0.125 MG tablet, Take 1 tablet (0.125 mg total) by mouth daily., Disp: 60 tablet, Rfl: 0 .  diltiazem (CARDIZEM CD) 240 MG 24 hr capsule, Take 1 capsule (240 mg total) by mouth daily., Disp: 30 capsule, Rfl: 3 .  donepezil (ARICEPT) 5 MG tablet, TAKE ONE TABLET AT BEDTIME  FOR MEMORY, Disp: 90 tablet, Rfl: 3 .  GNP MELATONIN MAXIMUM STRENGTH 5 MG TABS, TAKE ONE TABLET AT BEDTIME, Disp: 30 tablet, Rfl: 0 .  hydrALAZINE (APRESOLINE) 10 MG tablet, Take 1 tablet (10 mg total) by mouth 2 (two) times daily., Disp: 180 tablet, Rfl: 0 .  levothyroxine (SYNTHROID) 50 MCG tablet, TAKE 1 TABLET DAILY BEFORE BREAKFAST, Disp: 90 tablet, Rfl: 0 .  loperamide (LOPERAMIDE A-D) 2 MG tablet, Take 1 tablet (2 mg total) by mouth 4 (four) times daily as needed for diarrhea or loose stools., Disp: 120 tablet, Rfl: 3 .  metoprolol succinate (TOPROL-XL) 100 MG 24 hr tablet, TAKE 1 TABLET EVERY DAY. Take with or immediately following a meal., Disp: 90 tablet, Rfl: 0 .  Multiple Vitamin (MULTIVITAMIN WITH MINERALS) TABS tablet, Take 1 tablet by mouth every morning., Disp: , Rfl:  .  phenytoin (DILANTIN) 100 MG ER capsule, Take 1 capsule (100 mg total) by mouth 3 (three) times daily., Disp: 270 capsule, Rfl: 3 .  SAVAYSA 30 MG TABS tablet, TAKE 1 TABLET ONCE A DAY FOR AFIB, Disp: 90 tablet, Rfl: 2 .  tamsulosin (FLOMAX) 0.4 MG CAPS capsule, TAKE (1) CAPSULE DAILY, Disp: 90 capsule, Rfl: 0 .  traZODone (DESYREL) 50 MG tablet, TAKE 1/2 TO 1 TABLET AT BEDTIME AS NEEDED FOR SLEEP, Disp: 30 tablet, Rfl: 0 .  sulfamethoxazole-trimethoprim (BACTRIM DS) 800-160 MG tablet, Take 1 tablet by mouth  2 (two) times daily for 7 days., Disp: 14 tablet, Rfl: 0  Allergies  Allergen Reactions  . Penicillins Rash and Other (See Comments)    Has patient had a PCN reaction causing immediate rash, facial/tongue/throat swelling, SOB or lightheadedness with hypotension: No Has patient had a PCN reaction causing severe rash involving mucus membranes or skin necrosis: No Has patient had a PCN reaction that required hospitalization No Has patient had a PCN reaction occurring within the last 10 years: No If all of the above answers are "NO", then may proceed with Cephalosporin use.    Objective:   BP (!) 146/89    Pulse 74   Temp 99.6 F (37.6 C) (Temporal)   Ht 5\' 2"  (1.575 m)   Wt 121 lb 12.8 oz (55.2 kg)   SpO2 95%   BMI 22.28 kg/m    Physical Exam Vitals reviewed.  Constitutional:      General: She is not in acute distress.    Appearance: Normal appearance. She is normal weight. She is not ill-appearing, toxic-appearing or diaphoretic.  HENT:     Head: Normocephalic and atraumatic.  Eyes:     General: Vision grossly intact. Gaze aligned appropriately. No visual field deficit or scleral icterus.       Right eye: No discharge.        Left eye: No foreign body, discharge or hordeolum.     Extraocular Movements:     Left eye: Normal extraocular motion and no nystagmus.     Conjunctiva/sclera: Conjunctivae normal.     Left eye: Left conjunctiva is not injected. No chemosis, exudate or hemorrhage.     Comments: It appears a block left tear duct is what is causing the swelling and erythema under her left eye.  Cardiovascular:     Rate and Rhythm: Normal rate.  Pulmonary:     Effort: Pulmonary effort is normal. No respiratory distress.  Musculoskeletal:        General: Normal range of motion.     Cervical back: Normal range of motion.  Skin:    General: Skin is warm and dry.     Capillary Refill: Capillary refill takes less than 2 seconds.  Neurological:     General: No focal deficit present.     Mental Status: She is alert and oriented to person, place, and time. Mental status is at baseline.  Psychiatric:        Mood and Affect: Mood normal.        Behavior: Behavior normal.        Thought Content: Thought content normal.        Judgment: Judgment normal.

## 2019-03-16 NOTE — Patient Instructions (Signed)
[facility] [address] [place], [place] [address]

## 2019-03-19 ENCOUNTER — Encounter: Payer: Self-pay | Admitting: Family Medicine

## 2019-03-21 ENCOUNTER — Ambulatory Visit: Payer: Self-pay | Admitting: Licensed Clinical Social Worker

## 2019-03-21 DIAGNOSIS — H47293 Other optic atrophy, bilateral: Secondary | ICD-10-CM | POA: Diagnosis not present

## 2019-03-21 DIAGNOSIS — F03A0 Unspecified dementia, mild, without behavioral disturbance, psychotic disturbance, mood disturbance, and anxiety: Secondary | ICD-10-CM

## 2019-03-21 DIAGNOSIS — G4700 Insomnia, unspecified: Secondary | ICD-10-CM

## 2019-03-21 DIAGNOSIS — H04322 Acute dacryocystitis of left lacrimal passage: Secondary | ICD-10-CM | POA: Diagnosis not present

## 2019-03-21 DIAGNOSIS — M810 Age-related osteoporosis without current pathological fracture: Secondary | ICD-10-CM

## 2019-03-21 DIAGNOSIS — I5032 Chronic diastolic (congestive) heart failure: Secondary | ICD-10-CM

## 2019-03-21 DIAGNOSIS — E039 Hypothyroidism, unspecified: Secondary | ICD-10-CM

## 2019-03-21 DIAGNOSIS — Z961 Presence of intraocular lens: Secondary | ICD-10-CM | POA: Diagnosis not present

## 2019-03-21 DIAGNOSIS — F0390 Unspecified dementia without behavioral disturbance: Secondary | ICD-10-CM

## 2019-03-21 DIAGNOSIS — Z8673 Personal history of transient ischemic attack (TIA), and cerebral infarction without residual deficits: Secondary | ICD-10-CM

## 2019-03-21 NOTE — Patient Instructions (Addendum)
Licensed Clinical Social Worker Visit Information  Materials Provided: No  I reached out to [employer] by phone today.   Ms. Lerman [name] Frieson, son were previously given information about Chronic Care Management services.  1. CCM service includes personalized support from designated clinical staff supervised by her physician, including individualized plan of care and coordination with other care providers 2. 24/7 contact phone numbers for assistance for urgent and routine care needs. 3. Service will only be billed when office clinical staff spend 20 minutes or more in a month to coordinate care. 4. Only one practitioner may furnish and bill the service in a calendar month. 5. The patient may stop CCM services at any time (effective at the end of the month) by phone call to the office staff. 6. The patient will be responsible for cost sharing (co-pay) of up to 20% of the service fee (after annual deductible is met). Patient/Ray Eckhart have not agreed to services and wish to consider information provided before deciding about enrollment in care management services.   Follow Up Plan: Patient/Ray Cavey, son of patient, have not agreed to [facility] services. LCSW to call client/son in next 4 weeks to talk further with client or son about CCM program services  LCSW was not able to speak via phone with client or son of client today. Thus, the patient or her son were not able to verbalize understanding of instructions provided today and were not able to accept or decline a print copy of patient instruction materials.   Norva Riffle.Kehlani Vancamp MSW, LCSW Licensed Clinical Social Worker [facility] Care Management [phone]

## 2019-03-21 NOTE — Chronic Care Management (AMB) (Signed)
  Care Management Note   [name] is a 86 y.o. year old female who is a primary care patient of Dettinger, Fransisca Kaufmann, MD. The CM team was consulted for assistance with chronic disease management and care coordination.   I reached out to Wynelle Bourgeois by phone today.   Ms. Hagey [name], son were previously  given information about Chronic Care Management services.    1. CCM service includes personalized support from designated clinical staff supervised by her physician, including individualized plan of care and coordination with other care providers 2. 24/7 contact phone numbers for assistance for urgent and routine care needs. 3. Service will only be billed when office clinical staff spend 20 minutes or more in a month to coordinate care. 4. Only one practitioner may furnish and bill the service in a calendar month. 5. The patient may stop CCM services at any time (effective at the end of the month) by phone call to the office staff. 6. The patient will be responsible for cost sharing (co-pay) of up to 20% of the service fee (after annual deductible is met). Patient/[name] have not agreed to services and wish to consider information provided before deciding about enrollment in care management services.    Review of patient status, including review of consultants reports, relevant laboratory and other test results, and collaboration with appropriate care team members and the patient's provider was performed as part of comprehensive patient evaluation and provision of chronic care management services.   Medications   Acetaminophen (TYLENOL) 325 MG CAPS ARTIFICIAL TEAR OP atorvastatin (LIPITOR) 20 MG tablet calcium carbonate (OS-CAL) 1250 (500 Ca) MG chewable tablet clotrimazole-betamethasone (LOTRISONE) cream digoxin (DIGOX) 0.125 MG tablet diltiazem (CARDIZEM CD) 240 MG 24 hr capsule donepezil (ARICEPT) 5 MG tablet GNP MELATONIN MAXIMUM STRENGTH 5 MG TABS hydrALAZINE  (APRESOLINE) 10 MG tablet levothyroxine (SYNTHROID) 50 MCG tablet loperamide (LOPERAMIDE A-D) 2 MG tablet metoprolol succinate (TOPROL-XL) 100 MG 24 hr tablet Multiple Vitamin (MULTIVITAMIN WITH MINERALS) TABS tablet phenytoin (DILANTIN) 100 MG ER capsule SAVAYSA 30 MG TABS tablet sulfamethoxazole-trimethoprim (BACTRIM DS) 800-160 MG tablet tamsulosin (FLOMAX) 0.4 MG CAPS capsule traZODone (DESYREL) 50 MG tablet  Follow Up Plan: Patient/Terri Sailer, son of patient, have not agreed to [facility] services. LCSW to call client/son in next 4 weeks to talk further with client or son about CCM program services  Norva Riffle.Pansy Ostrovsky MSW, LCSW Licensed Clinical Social Worker [facility] Care Management [phone]

## 2019-03-27 DIAGNOSIS — C44629 Squamous cell carcinoma of skin of left upper limb, including shoulder: Secondary | ICD-10-CM | POA: Diagnosis not present

## 2019-03-28 ENCOUNTER — Other Ambulatory Visit: Payer: Self-pay | Admitting: Family Medicine

## 2019-03-28 ENCOUNTER — Other Ambulatory Visit: Payer: Self-pay | Admitting: Cardiology

## 2019-03-28 DIAGNOSIS — H47293 Other optic atrophy, bilateral: Secondary | ICD-10-CM | POA: Diagnosis not present

## 2019-03-28 DIAGNOSIS — H04322 Acute dacryocystitis of left lacrimal passage: Secondary | ICD-10-CM | POA: Diagnosis not present

## 2019-03-28 DIAGNOSIS — Z961 Presence of intraocular lens: Secondary | ICD-10-CM | POA: Diagnosis not present

## 2019-03-28 MED ORDER — LEVOTHYROXINE SODIUM 75 MCG PO TABS: 75.0000 ug | ORAL_TABLET | Freq: Every day | ORAL | 1 refills | Status: DC

## 2019-04-03 ENCOUNTER — Telehealth: Payer: Self-pay | Admitting: Cardiology

## 2019-04-03 NOTE — Telephone Encounter (Signed)
Returned call to patient's son.He stated mothers heart went out of rhythm last week.Stated she called ems.Stated they checked her and advised she did not have to go to ED.He requested appointment with Dr.Hochrein at the [facility] office to discuss if she would qualify for a cardioversion.Appointment scheduled with Dr.Hochrein at [facility] office 4/7 at 11:20 am.

## 2019-04-03 NOTE — Telephone Encounter (Signed)
Patient's son is seeking advice, he wants to know what is the best route to go when his mother goes into afib. He states that every time she feels her heart is out of rhythm she calls the ambulance and it is getting expensive. He wants to know what would be the best thing to do in this case. He also wants to get clarification on whether or not his mothers heart will not be shocked back into rhythm due to her age. He feels like he was told his but cannot remember.

## 2019-04-09 NOTE — Progress Notes (Signed)
Cardiology Office Note   Date:  04/11/2019   ID:  DRUSCILLA OROSCO, DOB May 27, 1932, MRN [id number]  PCP:  Dettinger, Fransisca Kaufmann, MD  Cardiologist:   Minus Breeding, MD   Chief Complaint  Patient presents with  . Palpitations      History of Present Illness: [name] is a 86 y.o. female who presents for follow up of atrial fib.  Since I last saw her she called because she had called EMS who her family member had 1 night.  There was some palpitations.  It will have a record of what they saw.  She is in atrial fibrillation but she was last time as well.  I think this is permanent now.  She does not really know she is out of rhythm now.  She cannot give me any details of what she was feeling last night and her family member who was there is not with her.  She is not having any shortness of breath now.  She denies any chest pressure, neck or arm discomfort.  She does not notice palpitations and she has not had any presyncope or syncope.  She tolerates anticoagulation.  The only thing she is complaining of he is having some pain down her left leg which has been the last couple of weeks and intermittent.  There is been no swelling.   Past Medical History:  Diagnosis Date  . Anemia   . Atrial fibrillation ([place])   . Calf pain    September, 2012, at rest  . Carotid bruit    Doppler, December, 2009, no abnormality  . Cataract   . Diverticulosis   . GERD (gastroesophageal reflux disease)   . History of shingles   . HLD (hyperlipidemia)   . HTN (hypertension)   . Hypothyroidism   . Insomnia   . Osteoarthritis   . PONV (postoperative nausea and vomiting)   . Rectal bleeding 2001   diverticulosis and int hemorrhoids on 07/1999 and 02/2010 colonoscopies.  . Seizures ([place])   . Stroke ([place])   . Warfarin anticoagulation     Past Surgical History:  Procedure Laterality Date  . BIOPSY  09/29/2015   Procedure: BIOPSY;  Surgeon: Rogene Houston, MD;  Location: [facility];  Service:  Endoscopy;;  gastric  . CATARACT EXTRACTION W/PHACO Right 04/15/2014   Procedure: CATARACT EXTRACTION PHACO AND INTRAOCULAR LENS PLACEMENT (IOC);  Surgeon: Tonny Branch, MD;  Location: [facility] ORS;  Service: Ophthalmology;  Laterality: Right;  CDE: 13.30  . CATARACT EXTRACTION W/PHACO Left 05/13/2014   Procedure: CATARACT EXTRACTION PHACO AND INTRAOCULAR LENS PLACEMENT LEFT EYE;  Surgeon: Tonny Branch, MD;  Location: [facility] ORS;  Service: Ophthalmology;  Laterality: Left;  CDE:13.00  . CHOLECYSTECTOMY N/A 01/15/2015   Procedure: LAPAROSCOPIC CHOLECYSTECTOMY;  Surgeon: Ralene Ok, MD;  Location: [place];  Service: General;  Laterality: N/A;  . COLONOSCOPY N/A 09/29/2015   Procedure: COLONOSCOPY;  Surgeon: Rogene Houston, MD;  Location: [facility];  Service: Endoscopy;  Laterality: N/A;  . COLONOSCOPY    . ESOPHAGOGASTRODUODENOSCOPY N/A 01/13/2015   Procedure: ESOPHAGOGASTRODUODENOSCOPY (EGD);  Surgeon: Jerene Bears, MD;  Location: [facility] ENDOSCOPY;  Service: Endoscopy;  Laterality: N/A;  . ESOPHAGOGASTRODUODENOSCOPY N/A 09/29/2015   Procedure: ESOPHAGOGASTRODUODENOSCOPY (EGD);  Surgeon: Rogene Houston, MD;  [facility];  Service: Endoscopy;  Laterality: N/A;  12:15  . GALLBLADDER SURGERY    . LEFT HEART CATH AND CORONARY ANGIOGRAPHY N/A 06/22/2016   Procedure: Left Heart Cath and  Coronary Angiography;  Surgeon: Troy Sine, MD;  Location: [place] CV LAB;  Service: Cardiovascular;  Laterality: N/A;  . SHOULDER OPEN ROTATOR CUFF REPAIR     bilateral  . TOTAL KNEE ARTHROPLASTY  05/26/10   right  . TOTAL KNEE ARTHROPLASTY  11/17/2010   Procedure: TOTAL KNEE ARTHROPLASTY;  Surgeon: Mauri Pole;  Location: [facility] ORS;  Service: Orthopedics;  Laterality: Left;  . TOTAL KNEE ARTHROPLASTY     Right  . TOTAL SHOULDER REPLACEMENT  01/2010   left     Current Outpatient Medications  Medication Sig Dispense Refill  . Acetaminophen (TYLENOL) 325 MG CAPS Take 650 mg by mouth every 6 (six) hours as  needed for mild pain.    Marland Kitchen ARTIFICIAL TEAR OP Apply 2 drops to eye daily as needed (Dryness). To the left eye    . atorvastatin (LIPITOR) 20 MG tablet TAKE ONE TABLET AT BEDTIME 90 tablet 0  . calcium carbonate (OS-CAL) 1250 (500 Ca) MG chewable tablet Chew 1 tablet by mouth daily.    . clotrimazole-betamethasone (LOTRISONE) cream Apply topically 2 times daily to affected areas until rash clears 45 g 0  . digoxin (LANOXIN) 0.125 MG tablet Take 1 tablet (0.125 mg total) by mouth daily. 90 tablet 0  . diltiazem (CARDIZEM CD) 240 MG 24 hr capsule Take 1 capsule (240 mg total) by mouth daily. 30 capsule 3  . donepezil (ARICEPT) 5 MG tablet TAKE ONE TABLET AT BEDTIME FOR MEMORY 90 tablet 3  . GNP MELATONIN MAXIMUM STRENGTH 5 MG TABS TAKE ONE TABLET AT BEDTIME 30 tablet 0  . hydrALAZINE (APRESOLINE) 10 MG tablet Take 1 tablet (10 mg total) by mouth 2 (two) times daily. 180 tablet 0  . levothyroxine (SYNTHROID) 75 MCG tablet Take 1 tablet (75 mcg total) by mouth daily. 90 tablet 1  . loperamide (LOPERAMIDE A-D) 2 MG tablet Take 1 tablet (2 mg total) by mouth 4 (four) times daily as needed for diarrhea or loose stools. 120 tablet 3  . metoprolol succinate (TOPROL-XL) 100 MG 24 hr tablet TAKE 1 TABLET EVERY DAY. Take with or immediately following a meal. 90 tablet 0  . Multiple Vitamin (MULTIVITAMIN WITH MINERALS) TABS tablet Take 1 tablet by mouth every morning.    . phenytoin (DILANTIN) 100 MG ER capsule Take 1 capsule (100 mg total) by mouth 3 (three) times daily. 270 capsule 3  . SAVAYSA 30 MG TABS tablet TAKE 1 TABLET ONCE A DAY FOR AFIB 90 tablet 0  . tamsulosin (FLOMAX) 0.4 MG CAPS capsule TAKE (1) CAPSULE DAILY 90 capsule 0  . traZODone (DESYREL) 50 MG tablet TAKE 1/2 TO 1 TABLET AT BEDTIME AS NEEDED FOR SLEEP 30 tablet 0   No current facility-administered medications for this visit.    Allergies:   Penicillins    ROS:  Please see the history of present illness.   Otherwise, review of systems  are positive for she has had diarrhea is having this managed..   All other systems are reviewed and negative.    PHYSICAL EXAM: VS:  BP 112/62   Pulse 71   Temp (!) 96.5 F (35.8 C)   Ht 5\' 2"  (1.575 m)   Wt 121 lb (54.9 kg)   BMI 22.13 kg/m  , BMI Body mass index is 22.13 kg/m. GENERAL:  Well appearing NECK:  No jugular venous distention, waveform within normal limits, carotid upstroke brisk and symmetric, no bruits, no thyromegaly LUNGS:  Clear to auscultation bilaterally CHEST:  Unremarkable HEART:  PMI not displaced or sustained,S1 and S2 within normal limits, no S3,  no clicks, no rubs, no murmurs, irregular ABD:  Flat, positive bowel sounds normal in frequency in pitch, no bruits, no rebound, no guarding, no midline pulsatile mass, no hepatomegaly, no splenomegaly EXT:  2 plus pulses upper, diminished dorsalis pedis and posterior tibialis bilateral, no edema, no cyanosis no clubbing   EKG:  EKG is ordered today. The ekg ordered today demonstrates atrial fibrillation, rate 71, leftward axis, poor anterior R wave progression, old anteroseptal infarct, no change previous.   Recent Labs: 02/05/2019: ALT 16; BUN 11; Creatinine, Ser 0.78; Hemoglobin 14.7; Platelets 347; Potassium 4.0; Sodium 137; TSH 5.370    Lipid Panel    Component Value Date/Time   CHOL 177 02/05/2019 1431   TRIG 122 02/05/2019 1431   HDL 94 02/05/2019 1431   CHOLHDL 1.9 02/05/2019 1431   CHOLHDL 2.1 01/31/2018 0557   VLDL 10 01/31/2018 0557   LDLCALC 62 02/05/2019 1431   LDLDIRECT 67 02/01/2017 1120      Wt Readings from Last 3 Encounters:  04/11/19 121 lb (54.9 kg)  03/16/19 121 lb 12.8 oz (55.2 kg)  02/05/19 120 lb 9.6 oz (54.7 kg)      Other studies Reviewed: Additional studies/ records that were reviewed today include: None. Review of the above records demonstrates:  Please see elsewhere in the note.     ASSESSMENT AND PLAN:  ATRIAL FIB:   Ms. RIOT STREITMATTER has a CHA2DS2 - VASc score  of 6.     She seems to be in persistent atrial fibrillation at this point.  She tolerates anticoagulation.  I do not really think she is feeling this arrhythmia I do not know what the event was the other night.  I am going to check a PT oxygen level but I am not planning on cardioversion or other rhythm management therapy.   HTN:  Blood pressure is controlled.  No change in therapy.   LEG PAIN: She does have some leg pain and decreased pulses.  I will check ABIs.  COVID EDUCATION: She has been vaccinated.  Current medicines are reviewed at length with the patient today.  The patient does not have concerns regarding medicines.  The following changes have been made:  no change  Labs/ tests ordered today include:   Orders Placed This Encounter  Procedures  . Digoxin level  . EKG 12-Lead  . VAS [place] LOWER EXTREMITY ARTERIAL DUPLEX     Disposition:   FU with me in six months    Signed, Minus Breeding, MD  04/11/2019 12:53 PM    [place]

## 2019-04-11 ENCOUNTER — Other Ambulatory Visit: Payer: Medicare Other

## 2019-04-11 ENCOUNTER — Ambulatory Visit (INDEPENDENT_AMBULATORY_CARE_PROVIDER_SITE_OTHER): Payer: Medicare Other | Admitting: Cardiology

## 2019-04-11 ENCOUNTER — Other Ambulatory Visit: Payer: Self-pay | Admitting: Family Medicine

## 2019-04-11 ENCOUNTER — Encounter: Payer: Self-pay | Admitting: Cardiology

## 2019-04-11 ENCOUNTER — Other Ambulatory Visit: Payer: Self-pay

## 2019-04-11 VITALS — BP 112/62 | HR 71 | Temp 96.5°F | Ht 62.0 in | Wt 121.0 lb

## 2019-04-11 DIAGNOSIS — M79606 Pain in leg, unspecified: Secondary | ICD-10-CM

## 2019-04-11 DIAGNOSIS — I480 Paroxysmal atrial fibrillation: Secondary | ICD-10-CM

## 2019-04-11 DIAGNOSIS — Z7189 Other specified counseling: Secondary | ICD-10-CM

## 2019-04-11 DIAGNOSIS — Z79899 Other long term (current) drug therapy: Secondary | ICD-10-CM

## 2019-04-11 DIAGNOSIS — I10 Essential (primary) hypertension: Secondary | ICD-10-CM

## 2019-04-11 NOTE — Patient Instructions (Signed)
Medication Instructions:  The current medical regimen is effective;  continue present plan and medications.  *If you need a refill on your cardiac medications before your next appointment, please call your pharmacy*   Lab Work: Please have blood work today at [employer] (Dig level)  If you have labs (blood work) drawn today and your tests are completely normal, you will receive your results only by: Marland Kitchen MyChart Message (if you have MyChart) OR . A paper copy in the mail If you have any lab test that is abnormal or we need to change your treatment, we will call you to review the results.   Testing/Procedures: Your physician has requested that you have a lower extremity arterial exercise duplex. During this test, exercise and ultrasound are used to evaluate arterial blood flow in the legs. Allow one hour for this exam. There are no restrictions or special instructions.  Follow-Up: At [facility], you and your health needs are our priority.  As part of our continuing mission to provide you with exceptional heart care, we have created designated Provider Care Teams.  These Care Teams include your primary Cardiologist (physician) and Advanced Practice Providers (APPs -  Physician Assistants and Nurse Practitioners) who all work together to provide you with the care you need, when you need it.  We recommend signing up for the patient portal called "MyChart".  Sign up information is provided on this After Visit Summary.  MyChart is used to connect with patients for Virtual Visits (Telemedicine).  Patients are able to view lab/test results, encounter notes, upcoming appointments, etc.  Non-urgent messages can be sent to your provider as well.   To learn more about what you can do with MyChart, go to [email or web address].    Your next appointment:   6 month(s)  The format for your next appointment:   In Person  Provider:   Minus Breeding, MD   Thank you for choosing [facility]!!

## 2019-04-12 ENCOUNTER — Telehealth: Payer: Self-pay | Admitting: *Deleted

## 2019-04-12 ENCOUNTER — Other Ambulatory Visit: Payer: Self-pay | Admitting: Family Medicine

## 2019-04-12 LAB — DIGOXIN LEVEL: Digoxin, Serum: 1.2 ng/mL — ABNORMAL HIGH (ref 0.5–0.9)

## 2019-04-12 MED ORDER — DIGOXIN 125 MCG PO TABS: 0.0625 mg | ORAL_TABLET | Freq: Every day | ORAL | 3 refills | Status: AC

## 2019-04-12 NOTE — Telephone Encounter (Signed)
Called in attempt to reach pt to review Digoxin results and to advise Dr Percival Spanish ordered her to decrease Digoxin to 0.0625 mg a day.  Per sister - pt is asleep and I should call her son Ray at [phone] (on DPR) to discuss medication change.  LM for Ray to c/b to review results/changes

## 2019-04-12 NOTE — Telephone Encounter (Signed)
Follow up  Pt's son returning call regarding result  Please call

## 2019-04-17 ENCOUNTER — Ambulatory Visit: Payer: Self-pay | Admitting: Licensed Clinical Social Worker

## 2019-04-17 ENCOUNTER — Other Ambulatory Visit: Payer: Self-pay | Admitting: Cardiology

## 2019-04-17 ENCOUNTER — Encounter (HOSPITAL_COMMUNITY): Payer: Medicare Other

## 2019-04-17 DIAGNOSIS — I739 Peripheral vascular disease, unspecified: Secondary | ICD-10-CM

## 2019-04-17 DIAGNOSIS — I10 Essential (primary) hypertension: Secondary | ICD-10-CM

## 2019-04-17 DIAGNOSIS — Z8673 Personal history of transient ischemic attack (TIA), and cerebral infarction without residual deficits: Secondary | ICD-10-CM

## 2019-04-17 DIAGNOSIS — F03A0 Unspecified dementia, mild, without behavioral disturbance, psychotic disturbance, mood disturbance, and anxiety: Secondary | ICD-10-CM

## 2019-04-17 DIAGNOSIS — F0390 Unspecified dementia without behavioral disturbance: Secondary | ICD-10-CM

## 2019-04-17 DIAGNOSIS — E039 Hypothyroidism, unspecified: Secondary | ICD-10-CM

## 2019-04-17 NOTE — Chronic Care Management (AMB) (Signed)
Chronic Care Management    Clinical Social Work Follow Up Note  04/17/2019 Name: [name] MRN: [id number] DOB: 1932-10-23  CAMPBELL AGRAMONTE is a 86 y.o. year old female who is a primary care patient of Dettinger, Fransisca Kaufmann, MD. The CCM team was consulted for assistance with [employer] .   Review of patient status, including review of consultants reports, other relevant assessments, and collaboration with appropriate care team members and the patient's provider was performed as part of comprehensive patient evaluation and provision of chronic care management services.    Ms. Clawson was given information about Chronic Care Management services today including:  1. CCM service includes personalized support from designated clinical staff supervised by her physician, including individualized plan of care and coordination with other care providers 2. 24/7 contact phone numbers for assistance for urgent and routine care needs. 3. Service will only be billed when office clinical staff spend 20 minutes or more in a month to coordinate care. 4. Only one practitioner may furnish and bill the service in a calendar month. 5. The patient may stop CCM services at any time (effective at the end of the month) by phone call to the office staff. 6. The patient will be responsible for cost sharing (co-pay) of up to 20% of the service fee (after annual deductible is met).  Patient did not agree to services and wishes to consider information provided before deciding about enrollment in care management services.   SDOH (Social Determinants of Health) assessments performed: Yes   Outpatient Encounter Medications as of 04/17/2019  Medication Sig  . Acetaminophen (TYLENOL) 325 MG CAPS Take 650 mg by mouth every 6 (six) hours as needed for mild pain.  Marland Kitchen ARTIFICIAL TEAR OP Apply 2 drops to eye daily as needed (Dryness). To the left eye  . atorvastatin (LIPITOR) 20 MG tablet TAKE ONE TABLET AT BEDTIME  .  calcium carbonate (OS-CAL) 1250 (500 Ca) MG chewable tablet Chew 1 tablet by mouth daily.  . clotrimazole-betamethasone (LOTRISONE) cream Apply topically 2 times daily to affected areas until rash clears  . digoxin (LANOXIN) 0.125 MG tablet Take 0.5 tablets (0.0625 mg total) by mouth daily.  Marland Kitchen diltiazem (CARDIZEM CD) 240 MG 24 hr capsule Take 1 capsule (240 mg total) by mouth daily.  Marland Kitchen donepezil (ARICEPT) 5 MG tablet TAKE ONE TABLET AT BEDTIME FOR MEMORY  . GNP MELATONIN MAXIMUM STRENGTH 5 MG TABS TAKE ONE TABLET AT BEDTIME  . hydrALAZINE (APRESOLINE) 10 MG tablet TAKE 1 TABLET 2 TIMES A DAY  . levothyroxine (SYNTHROID) 75 MCG tablet Take 1 tablet (75 mcg total) by mouth daily.  Marland Kitchen loperamide (IMODIUM) 2 MG capsule TAKE (1) CAPSULE FOUR TIMES DAILY AS NEEDED FOR DIARRHEA OR LOOSE STOOLS  . metoprolol succinate (TOPROL-XL) 100 MG 24 hr tablet TAKE 1 TABLET EVERY DAY. Take with or immediately following a meal.  . Multiple Vitamin (MULTIVITAMIN WITH MINERALS) TABS tablet Take 1 tablet by mouth every morning.  . phenytoin (DILANTIN) 100 MG ER capsule Take 1 capsule (100 mg total) by mouth 3 (three) times daily.  Marland Kitchen SAVAYSA 30 MG TABS tablet TAKE 1 TABLET ONCE A DAY FOR AFIB  . tamsulosin (FLOMAX) 0.4 MG CAPS capsule TAKE (1) CAPSULE DAILY  . traZODone (DESYREL) 50 MG tablet TAKE 1/2 TO 1 TABLET AT BEDTIME AS NEEDED FOR SLEEP   No facility-administered encounter medications on file as of 04/17/2019.     LCSW spoke via phone with client today. LCSW talked with Inez Catalina  about CCM program services. Deanda did not consent to CCM program services. She said she wanted to talk further with her son, Shawnee Gambone about program. LCSW encouraged Tasheka or her son, Teale Goodgame to call LCSW at [phone] to further discuss CCM program services   Follow Up Plan: LCSW to call client or her son in next 4 weeks to talk further with client or son about CCM program services  Norva Riffle.Marios Gaiser MSW, LCSW Licensed Clinical  Social Worker [facility] [phone]

## 2019-04-17 NOTE — Patient Instructions (Addendum)
Licensed Clinical Social Worker Visit Information  [name] was given information about Chronic Care Management services today including:  1. CCM service includes personalized support from designated clinical staff supervised by her physician, including individualized plan of care and coordination with other care providers 2. 24/7 contact phone numbers for assistance for urgent and routine care needs. 3. Service will only be billed when office clinical staff spend 20 minutes or more in a month to coordinate care. 4. Only one practitioner may furnish and bill the service in a calendar month. 5. The patient may stop CCM services at any time (effective at the end of the month) by phone call to the office staff. 6. The patient will be responsible for cost sharing (co-pay) of up to 20% of the service fee (after annual deductible is met). Patient did not agree to services and wishes to consider information provided before deciding about enrollment in care management services  Materials Provided: No  LCSW spoke via phone with client today. LCSW talked with [name] about CCM program services. [name] did not consent to CCM program services. She said she wanted to talk further with her son, [name] about program. LCSW encouraged Tyaisha or her son, [name] to call LCSW at [phone] to further discuss CCM program services   Follow Up Plan: LCSW to call client or her son in next 4 weeks to talk further with client or son about CCM program services  The patient verbalized understanding of instructions provided today and declined a print copy of patient instruction materials.    Norva Riffle.Safiya Girdler MSW, LCSW Licensed Clinical Social Worker [facility] [phone]

## 2019-04-18 ENCOUNTER — Other Ambulatory Visit: Payer: Self-pay

## 2019-04-18 ENCOUNTER — Ambulatory Visit (HOSPITAL_COMMUNITY)
Admission: RE | Admit: 2019-04-18 | Discharge: 2019-04-18 | Disposition: A | Discharge status: Home / Self Care | Payer: Medicare Other | Source: Ambulatory Visit | Attending: Cardiovascular Disease | Admitting: Cardiovascular Disease

## 2019-04-18 DIAGNOSIS — M79606 Pain in leg, unspecified: Secondary | ICD-10-CM | POA: Diagnosis not present

## 2019-04-18 DIAGNOSIS — I739 Peripheral vascular disease, unspecified: Secondary | ICD-10-CM | POA: Insufficient documentation

## 2019-05-09 ENCOUNTER — Encounter: Payer: Self-pay | Admitting: Family Medicine

## 2019-05-09 ENCOUNTER — Other Ambulatory Visit: Payer: Self-pay

## 2019-05-09 ENCOUNTER — Ambulatory Visit (INDEPENDENT_AMBULATORY_CARE_PROVIDER_SITE_OTHER): Payer: Medicare Other | Admitting: Family Medicine

## 2019-05-09 ENCOUNTER — Other Ambulatory Visit: Payer: Self-pay | Admitting: Family Medicine

## 2019-05-09 VITALS — BP 148/75 | HR 92 | Temp 98.3°F | Ht 62.0 in | Wt 123.0 lb

## 2019-05-09 DIAGNOSIS — R040 Epistaxis: Secondary | ICD-10-CM

## 2019-05-09 DIAGNOSIS — I480 Paroxysmal atrial fibrillation: Secondary | ICD-10-CM | POA: Diagnosis not present

## 2019-05-09 DIAGNOSIS — I10 Essential (primary) hypertension: Secondary | ICD-10-CM

## 2019-05-09 DIAGNOSIS — E039 Hypothyroidism, unspecified: Secondary | ICD-10-CM

## 2019-05-09 DIAGNOSIS — E785 Hyperlipidemia, unspecified: Secondary | ICD-10-CM

## 2019-05-09 NOTE — Progress Notes (Signed)
BP (!) 148/75   Pulse 92   Temp 98.3 F (36.8 C) (Temporal)   Ht 5\' 2"  (1.575 m)   Wt 123 lb (55.8 kg)   BMI 22.50 kg/m    Subjective:   Patient ID: [name], female    DOB: May 11, 1932, 86 y.o.   MRN: [id number]  HPI: DANDRIA PUENTE is a 86 y.o. female presenting on 05/09/2019 for No chief complaint on file.   HPI Hypothyroidism recheck Patient is coming in for thyroid recheck today as well. They deny any issues with hair changes or heat or cold problems or diarrhea or constipation. They deny any chest pain or palpitations. They are currently on levothyroxine 75 micrograms   Patient has been getting nosebleeds over the past couple weeks occasionally.  They wanted to know if it could be related to allergies or what is going on.  She had not been having them previously.  They are not profuse and do stop but she has been having them frequently.  Her daughter is also concerned that sometimes she does not pick at it which causes them to bleed more.  Relevant past medical, surgical, family and social history reviewed and updated as indicated. Interim medical history since our last visit reviewed. Allergies and medications reviewed and updated.  Review of Systems  Constitutional: Negative for chills and fever.  HENT: Positive for nosebleeds.   Eyes: Negative for visual disturbance.  Respiratory: Negative for chest tightness and shortness of breath.   Cardiovascular: Negative for chest pain and leg swelling.  Musculoskeletal: Negative for back pain and gait problem.  Skin: Negative for rash.  Neurological: Negative for light-headedness and headaches.  Psychiatric/Behavioral: Negative for agitation and behavioral problems.  All other systems reviewed and are negative.   Per HPI unless specifically indicated above   Allergies as of 05/09/2019      Reactions   Penicillins Rash, Other (See Comments)   Has patient had a PCN reaction causing immediate rash, facial/tongue/throat  swelling, SOB or lightheadedness with hypotension: No Has patient had a PCN reaction causing severe rash involving mucus membranes or skin necrosis: No Has patient had a PCN reaction that required hospitalization No Has patient had a PCN reaction occurring within the last 10 years: No If all of the above answers are "NO", then may proceed with Cephalosporin use.      Medication List       Accurate as of May 09, 2019 11:12 AM. If you have any questions, ask your nurse or doctor.        ARTIFICIAL TEAR OP Apply 2 drops to eye daily as needed (Dryness). To the left eye   atorvastatin 20 MG tablet Commonly known as: LIPITOR TAKE ONE TABLET AT BEDTIME   calcium carbonate 1250 (500 Ca) MG chewable tablet Commonly known as: OS-CAL Chew 1 tablet by mouth daily.   clotrimazole-betamethasone cream Commonly known as: LOTRISONE Apply topically 2 times daily to affected areas until rash clears   digoxin 0.125 MG tablet Commonly known as: LANOXIN Take 0.5 tablets (0.0625 mg total) by mouth daily.   diltiazem 240 MG 24 hr capsule Commonly known as: CARDIZEM CD Take 1 capsule (240 mg total) by mouth daily.   donepezil 5 MG tablet Commonly known as: ARICEPT TAKE ONE TABLET AT BEDTIME FOR MEMORY   GNP Melatonin Maximum Strength 5 MG Tabs Generic drug: melatonin TAKE ONE TABLET AT BEDTIME   hydrALAZINE 10 MG tablet Commonly known as: APRESOLINE TAKE 1 TABLET 2  TIMES A DAY   levothyroxine 75 MCG tablet Commonly known as: SYNTHROID Take 1 tablet (75 mcg total) by mouth daily.   loperamide 2 MG capsule Commonly known as: IMODIUM TAKE (1) CAPSULE FOUR TIMES DAILY AS NEEDED FOR DIARRHEA OR LOOSE STOOLS   metoprolol succinate 100 MG 24 hr tablet Commonly known as: TOPROL-XL TAKE 1 TABLET EVERY DAY. Take with or immediately following a meal.   multivitamin with minerals Tabs tablet Take 1 tablet by mouth every morning.   phenytoin 100 MG ER capsule Commonly known as:  DILANTIN Take 1 capsule (100 mg total) by mouth 3 (three) times daily.   Savaysa 30 MG Tabs tablet Generic drug: edoxaban TAKE 1 TABLET ONCE A DAY FOR AFIB   tamsulosin 0.4 MG Caps capsule Commonly known as: FLOMAX TAKE (1) CAPSULE DAILY   traZODone 50 MG tablet Commonly known as: DESYREL TAKE 1/2 TO 1 TABLET AT BEDTIME AS NEEDED FOR SLEEP   Tylenol 325 MG Caps Generic drug: Acetaminophen Take 650 mg by mouth every 6 (six) hours as needed for mild pain.        Objective:   BP (!) 148/75   Pulse 92   Temp 98.3 F (36.8 C) (Temporal)   Ht 5\' 2"  (1.575 m)   Wt 123 lb (55.8 kg)   BMI 22.50 kg/m   Wt Readings from Last 3 Encounters:  05/09/19 123 lb (55.8 kg)  04/11/19 121 lb (54.9 kg)  03/16/19 121 lb 12.8 oz (55.2 kg)    Physical Exam Vitals and nursing note reviewed.  Constitutional:      General: She is not in acute distress.    Appearance: She is well-developed. She is not diaphoretic.  Eyes:     Conjunctiva/sclera: Conjunctivae normal.  Cardiovascular:     Rate and Rhythm: Normal rate and regular rhythm.     Heart sounds: Normal heart sounds. No murmur.  Pulmonary:     Effort: Pulmonary effort is normal. No respiratory distress.     Breath sounds: Normal breath sounds. No wheezing.  Musculoskeletal:        General: No tenderness. Normal range of motion.  Skin:    General: Skin is warm and dry.     Findings: No rash.  Neurological:     Mental Status: She is alert and oriented to person, place, and time.     Coordination: Coordination normal.  Psychiatric:        Behavior: Behavior normal.       Assessment & Plan:   Problem List Items Addressed This Visit      Cardiovascular and Mediastinum   Paroxysmal atrial fibrillation (HCC)   HTN (hypertension)     Endocrine   Hypothyroidism - Primary   Relevant Orders   TSH     Other   Dyslipidemia    Other Visit Diagnoses    Nosebleed          Will recheck patient's thyroid today, nosebleeds  looking at it seem to be likely from dry and allergies.  Recommended nasal saline for it and see if it persists. Follow up plan: Return in about 3 months (around 08/09/2019), or if symptoms worsen or fail to improve, for Thyroid recheck.  Counseling provided for all of the vaccine components No orders of the defined types were placed in this encounter.   Caryl Pina, MD [facility] 05/09/2019, 11:12 AM

## 2019-05-10 LAB — TSH: TSH: 2.81 u[IU]/mL (ref 0.450–4.500)

## 2019-05-12 ENCOUNTER — Telehealth: Payer: Self-pay | Admitting: Nurse Practitioner

## 2019-05-12 DIAGNOSIS — R040 Epistaxis: Secondary | ICD-10-CM | POA: Diagnosis not present

## 2019-05-12 NOTE — Telephone Encounter (Signed)
Mom ha had nose bleed for 4 days. Has been bleeding intermittently. Was encouraged to go to the urgent care to get nose bleed to stop anf have hgb checked.

## 2019-05-16 DIAGNOSIS — J3489 Other specified disorders of nose and nasal sinuses: Secondary | ICD-10-CM | POA: Diagnosis not present

## 2019-05-16 DIAGNOSIS — R040 Epistaxis: Secondary | ICD-10-CM | POA: Diagnosis not present

## 2019-05-16 DIAGNOSIS — Z7901 Long term (current) use of anticoagulants: Secondary | ICD-10-CM | POA: Diagnosis not present

## 2019-05-17 ENCOUNTER — Ambulatory Visit: Payer: Self-pay | Admitting: Licensed Clinical Social Worker

## 2019-05-17 DIAGNOSIS — Z8673 Personal history of transient ischemic attack (TIA), and cerebral infarction without residual deficits: Secondary | ICD-10-CM

## 2019-05-17 DIAGNOSIS — F03A0 Unspecified dementia, mild, without behavioral disturbance, psychotic disturbance, mood disturbance, and anxiety: Secondary | ICD-10-CM

## 2019-05-17 DIAGNOSIS — I10 Essential (primary) hypertension: Secondary | ICD-10-CM

## 2019-05-17 DIAGNOSIS — E039 Hypothyroidism, unspecified: Secondary | ICD-10-CM

## 2019-05-17 NOTE — Patient Instructions (Addendum)
Licensed Clinical Social Worker Visit Information  Materials Provided: No  05/17/2019   Name: [name] MRN: [id number] DOB: 02/23/1932   [name] is a 86 y.o. year old female who is a primary care patient of Dettinger, Fransisca Kaufmann, MD. The CCM team was consulted for assistance with [employer] .   Review of patient status, including review of consultants reports, other relevant assessments, and collaboration with appropriate care team members and the patient's provider was performed as part of comprehensive patient evaluation and provision of chronic care management services.   SDOH (Social Determinants of Health) assessments performed: No   LCSW called Domenic Polite , son of client, today and spoke with Domenic Polite about CCM program support. Terri said he would decline CCM program support now for client; however, he said he may be interested in the future in such support . He said he would save number for LCSW in case he wanted to call LCSW to talk further about CCM program support for client   The patient/[name], son, verbalized understanding of instructions provided today and declined a print copy of patient instruction materials.   Norva Riffle.Jemar Paulsen MSW, LCSW Licensed Clinical Social Worker [facility] [phone]

## 2019-05-17 NOTE — Chronic Care Management (AMB) (Signed)
  Chronic Care Management    Clinical Social Work Follow Up Note  05/17/2019 Name: [name] MRN: [id number] DOB: 1932-08-01  [name] is a 86 y.o. year old female who is a primary care patient of Dettinger, Fransisca Kaufmann, MD. The CCM team was consulted for assistance with [employer] .   Review of patient status, including review of consultants reports, other relevant assessments, and collaboration with appropriate care team members and the patient's provider was performed as part of comprehensive patient evaluation and provision of chronic care management services.    SDOH (Social Determinants of Health) assessments performed: No   Outpatient Encounter Medications as of 05/17/2019  Medication Sig  . Acetaminophen (TYLENOL) 325 MG CAPS Take 650 mg by mouth every 6 (six) hours as needed for mild pain.  Marland Kitchen ARTIFICIAL TEAR OP Apply 2 drops to eye daily as needed (Dryness). To the left eye  . atorvastatin (LIPITOR) 20 MG tablet TAKE ONE TABLET AT BEDTIME  . calcium carbonate (OS-CAL) 1250 (500 Ca) MG chewable tablet Chew 1 tablet by mouth daily.  . clotrimazole-betamethasone (LOTRISONE) cream Apply topically 2 times daily to affected areas until rash clears  . digoxin (LANOXIN) 0.125 MG tablet Take 0.5 tablets (0.0625 mg total) by mouth daily.  Marland Kitchen diltiazem (CARDIZEM CD) 240 MG 24 hr capsule Take 1 capsule (240 mg total) by mouth daily.  Marland Kitchen donepezil (ARICEPT) 5 MG tablet TAKE ONE TABLET AT BEDTIME FOR MEMORY  . GNP MELATONIN MAXIMUM STRENGTH 5 MG TABS TAKE ONE TABLET AT BEDTIME  . hydrALAZINE (APRESOLINE) 10 MG tablet TAKE 1 TABLET 2 TIMES A DAY  . levothyroxine (SYNTHROID) 75 MCG tablet Take 1 tablet (75 mcg total) by mouth daily.  Marland Kitchen loperamide (IMODIUM) 2 MG capsule TAKE (1) CAPSULE FOUR TIMES DAILY AS NEEDED FOR DIARRHEA OR LOOSE STOOLS  . metoprolol succinate (TOPROL-XL) 100 MG 24 hr tablet TAKE 1 TABLET EVERY DAY. Take with or immediately following a meal.  . Multiple Vitamin  (MULTIVITAMIN WITH MINERALS) TABS tablet Take 1 tablet by mouth every morning.  . phenytoin (DILANTIN) 100 MG ER capsule Take 1 capsule (100 mg total) by mouth 3 (three) times daily.  Marland Kitchen SAVAYSA 30 MG TABS tablet TAKE 1 TABLET ONCE A DAY FOR AFIB  . tamsulosin (FLOMAX) 0.4 MG CAPS capsule TAKE (1) CAPSULE DAILY  . traZODone (DESYREL) 50 MG tablet TAKE 1/2 TO 1 TABLET AT BEDTIME AS NEEDED FOR SLEEP   No facility-administered encounter medications on file as of 05/17/2019.   LCSW called Domenic Polite , son of client, today and spoke with Domenic Polite about CCM program support. Ray said he would decline CCM program support now for client; however, he said he may be interested in the future in such support . He said he would save number for LCSW in case he wanted to call LCSW to talk further about CCM program support for client   Norva Riffle.Vance Hochmuth MSW, LCSW Licensed Clinical Social Worker [facility] [phone]

## 2019-05-23 ENCOUNTER — Other Ambulatory Visit: Payer: Self-pay | Admitting: Family Medicine

## 2019-05-29 ENCOUNTER — Other Ambulatory Visit: Payer: Self-pay | Admitting: Family Medicine

## 2019-06-06 ENCOUNTER — Other Ambulatory Visit: Payer: Self-pay | Admitting: Cardiology

## 2019-06-07 ENCOUNTER — Other Ambulatory Visit: Payer: Self-pay | Admitting: Family Medicine

## 2019-06-20 ENCOUNTER — Ambulatory Visit: Payer: Medicare Other | Admitting: Cardiology

## 2019-06-21 ENCOUNTER — Other Ambulatory Visit: Payer: Self-pay | Admitting: Cardiology

## 2019-07-18 ENCOUNTER — Other Ambulatory Visit: Payer: Self-pay | Admitting: Family Medicine

## 2019-08-08 ENCOUNTER — Ambulatory Visit: Payer: Medicare Other | Admitting: Neurology

## 2019-08-08 ENCOUNTER — Other Ambulatory Visit: Payer: Self-pay

## 2019-08-08 ENCOUNTER — Encounter: Payer: Self-pay | Admitting: Neurology

## 2019-08-08 VITALS — BP 120/73 | HR 84 | Ht 62.0 in | Wt 121.8 lb

## 2019-08-08 DIAGNOSIS — F03B0 Unspecified dementia, moderate, without behavioral disturbance, psychotic disturbance, mood disturbance, and anxiety: Secondary | ICD-10-CM

## 2019-08-08 DIAGNOSIS — G40009 Localization-related (focal) (partial) idiopathic epilepsy and epileptic syndromes with seizures of localized onset, not intractable, without status epilepticus: Secondary | ICD-10-CM | POA: Diagnosis not present

## 2019-08-08 DIAGNOSIS — F0390 Unspecified dementia without behavioral disturbance: Secondary | ICD-10-CM

## 2019-08-08 MED ORDER — DONEPEZIL HCL 5 MG PO TABS: ORAL_TABLET | ORAL | 3 refills | Status: AC

## 2019-08-08 MED ORDER — PHENYTOIN SODIUM EXTENDED 100 MG PO CAPS: 100.0000 mg | ORAL_CAPSULE | Freq: Three times a day (TID) | ORAL | 3 refills | Status: DC

## 2019-08-08 NOTE — Progress Notes (Signed)
NEUROLOGY FOLLOW UP OFFICE NOTE  [name] [id number] [birth year]  HISTORY OF PRESENT ILLNESS: I had the pleasure of seeing [name] in follow-up in the neurology clinic on 08/08/2019.  The patient was last evaluated in the neurology clinic by telephone 7 months ago for seizures and dementia. She is again accompanied by her caregiver [name] who helps supplement the history today. Since her last visit, symptoms overall stable. She lives with her sister who manages her medications. [name] comes three days a week. She has a little sundowning in the afternoon. [name] reports her memory is not too good, she forgets things. The patient states "I'm feeling half-crazy." [name] denies any seizures since February 2020, no staring/unresponsive episodes, myoclonic jerks. The patient denies any headaches, dizziness, focal numbness/tingling/weakness. She is a little wobbly today, no falls. She reports floaters in her eyes, they deny any hallucinations. She is independent with dressing and bathing. She is on Dilantin 100mg  TID and Donepezil 5mg  daily without side effects.    History on Initial Assessment 06/30/2018: This is an 86 year old right-handed woman with a history of hypertension, atrial fibrillation on chronic anticoagulation with Eliquis, CHF, presenting for evaluation of seizure. She has had recurrent episodes of confusion since 2017, EEGs in the past were unrevealing. Her son had witnessed staring spells lasting a few minutes. She had another confusional episode in January 2020 and was noted to have aphasia. MRI brain did not show any acute changes, there was severe chronic microvascular disease and old small cerebellar infarcts, marked global atrophy. EEG unremarkable. She had marked improvement in symptoms and was discharged home on Levetiracetam 500mg  BID. She was back in the hospital in February 2020 for left-sided weakness, EEG showed right hemisphere slowing. There is note of limb jerking.  Levetiracetam increased to 750mg  BID. She was very drowsy on the LEV and was switched to Phenytoin, with improvement in sedation. She has been taking Phenytoin 100mg  TID without side effects. Due to interaction with Eliquis, she was switched to a different anticoagulant Edoxaban. She has been living with her sister since hospital discharge in February who now manages her medications. They deny any further seizures since February 2020. He and her sister have not seen any further staring spells. She denies any olfactory/gustatory hallucinations, deja vu, rising epigastric sensation, focal numbness/tingling/weakness, myoclonic jerks. When asked about her memory, she states "I don't know, is that good or not." Her son reports memory changes started several years ago, worsening each year. She remembers things like her birthday, but she cannot keep up with dates and gets confused with her TV remote control and phone. She was started on Donepezil in February and he feels it has helped her. She stopped driving a year ago. Her son has managed finances since her husband passed away a year ago. She is independent with dressing and bathing. A sitter comes 3 days a week to help. Son denies any hallucinations or paranoia. She states that sometimes she "can't clear things out of my mind, I'm not worried, just a whole lot been happening." She reports sleep difficulties, "something to take the edge off." No wandering behavior.   Epilepsy Risk Factors:  Her brother and sister had seizures. Otherwise she had a normal birth and early development.  There is no history of febrile convulsions, CNS infections such as meningitis/encephalitis, significant traumatic brain injury, neurosurgical procedures  PAST MEDICAL HISTORY: Past Medical History:  Diagnosis Date  . Anemia   . Atrial fibrillation ([place])   .  Calf pain    September, 2012, at rest  . Carotid bruit    Doppler, December, 2009, no abnormality  . Cataract   .  Diverticulosis   . GERD (gastroesophageal reflux disease)   . History of shingles   . HLD (hyperlipidemia)   . HTN (hypertension)   . Hypothyroidism   . Insomnia   . Osteoarthritis   . PONV (postoperative nausea and vomiting)   . Rectal bleeding 2001   diverticulosis and int hemorrhoids on 07/1999 and 02/2010 colonoscopies.  . Seizures ([place])   . Stroke ([place])   . Warfarin anticoagulation     MEDICATIONS: Current Outpatient Medications on File Prior to Visit  Medication Sig Dispense Refill  . Acetaminophen (TYLENOL) 325 MG CAPS Take 650 mg by mouth every 6 (six) hours as needed for mild pain.    Marland Kitchen ARTIFICIAL TEAR OP Apply 2 drops to eye daily as needed (Dryness). To the left eye    . atorvastatin (LIPITOR) 20 MG tablet TAKE ONE TABLET AT BEDTIME 90 tablet 0  . calcium carbonate (OS-CAL) 1250 (500 Ca) MG chewable tablet Chew 1 tablet by mouth daily.    . clotrimazole-betamethasone (LOTRISONE) cream Apply topically 2 times daily to affected areas until rash clears 45 g 0  . digoxin (LANOXIN) 0.125 MG tablet Take 0.5 tablets (0.0625 mg total) by mouth daily. 45 tablet 3  . diltiazem (CARDIZEM CD) 240 MG 24 hr capsule TAKE (1) CAPSULE DAILY 30 capsule 7  . donepezil (ARICEPT) 5 MG tablet TAKE ONE TABLET AT BEDTIME FOR MEMORY 90 tablet 3  . GNP MELATONIN MAXIMUM STRENGTH 5 MG TABS TAKE ONE TABLET AT BEDTIME 30 tablet 5  . hydrALAZINE (APRESOLINE) 10 MG tablet TAKE 1 TABLET 2 TIMES A DAY 180 tablet 0  . levothyroxine (SYNTHROID) 75 MCG tablet Take 1 tablet (75 mcg total) by mouth daily. 90 tablet 1  . loperamide (IMODIUM) 2 MG capsule TAKE (1) CAPSULE FOUR TIMES DAILY AS NEEDED FOR DIARRHEA OR LOOSE STOOLS 120 capsule 0  . metoprolol succinate (TOPROL-XL) 100 MG 24 hr tablet TAKE 1 TABLET EVERY DAY. Take with or immediately following a meal. 90 tablet 0  . Multiple Vitamin (MULTIVITAMIN WITH MINERALS) TABS tablet Take 1 tablet by mouth every morning.    . phenytoin (DILANTIN) 100 MG ER capsule  Take 1 capsule (100 mg total) by mouth 3 (three) times daily. 270 capsule 3  . SAVAYSA 30 MG TABS tablet TAKE 1 TABLET ONCE A DAY FOR AFIB 90 tablet 0  . tamsulosin (FLOMAX) 0.4 MG CAPS capsule TAKE (1) CAPSULE DAILY 90 capsule 0  . traZODone (DESYREL) 50 MG tablet TAKE 1/2 TO 1 TABLET AT BEDTIME AS NEEDED FOR SLEEP 30 tablet 0   No current facility-administered medications on file prior to visit.    ALLERGIES: Allergies  Allergen Reactions  . Penicillins Rash and Other (See Comments)    Has patient had a PCN reaction causing immediate rash, facial/tongue/throat swelling, SOB or lightheadedness with hypotension: No Has patient had a PCN reaction causing severe rash involving mucus membranes or skin necrosis: No Has patient had a PCN reaction that required hospitalization No Has patient had a PCN reaction occurring within the last 10 years: No If all of the above answers are "NO", then may proceed with Cephalosporin use.    FAMILY HISTORY: Family History  Problem Relation Age of Onset  . COPD Brother   . Atrial fibrillation Brother   . Diabetes Brother   . Liver cancer  Brother        Alcohol-related  . Colon cancer Brother   . Heart attack Father   . Early death Father   . Heart disease Father   . Rheum arthritis Mother   . Arthritis Mother   . Heart disease Mother   . Coronary artery disease Brother   . Heart attack Daughter   . Gallbladder disease Brother     SOCIAL HISTORY: Social History   Socioeconomic History  . Marital status: Widowed    Spouse name: Not on file  . Number of children: 1  . Years of education: Not on file  . Highest education level: Not on file  Occupational History  . Occupation: retired    Comment: self - hair-dresser  Tobacco Use  . Smoking status: Never Smoker  . Smokeless tobacco: Never Used  Vaping Use  . Vaping Use: Never used  Substance and Sexual Activity  . Alcohol use: No  . Drug use: No  . Sexual activity: Never  Other  Topics Concern  . Not on file  Social History Narrative   Right handed    Split level. Only one step to go outside   Lives with sister   Social Determinants of Health   Financial Resource Strain:   . Difficulty of Paying Living Expenses:   Food Insecurity:   . Worried About Charity fundraiser in the Last Year:   . Arboriculturist in the Last Year:   Transportation Needs:   . Film/video editor (Medical):   Marland Kitchen Lack of Transportation (Non-Medical):   Physical Activity:   . Days of Exercise per Week:   . Minutes of Exercise per Session:   Stress:   . Feeling of Stress :   Social Connections:   . Frequency of Communication with Friends and Family:   . Frequency of Social Gatherings with Friends and Family:   . Attends Religious Services:   . Active Member of Clubs or Organizations:   . Attends Archivist Meetings:   Marland Kitchen Marital Status:   Intimate Partner Violence:   . Fear of Current or Ex-Partner:   . Emotionally Abused:   Marland Kitchen Physically Abused:   . Sexually Abused:     PHYSICAL EXAM: Vitals:   08/08/19 1546  BP: 120/73  Pulse: 84  SpO2: 96%   General: No acute distress, flat affect Head:  Normocephalic/atraumatic Skin/Extremities: No rash, no edema Neurological Exam: alert and oriented to person, place. Month is February, did not know year. No aphasia or dysarthria. Fund of knowledge is reduced. Recent and remote memory are impaired. Attention and concentration are reduced. Cranial nerves: Pupils equal, round, reactive to light. Extraocular movements intact with no nystagmus. Visual fields full. No facial asymmetry. Motor: Bulk and tone normal, muscle strength 5/5 throughout with no pronator drift.  Finger to nose testing intact.  Gait narrow-based, slightly unsteady with cane, no ataxia   IMPRESSION: This is an 86 yo RH woman with a history of hypertension, atrial fibrillation on chronic anticoagulation with Eliquis, CHF, dementia, and seizures. 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 MRI brain no acute changes, severe chronic microvascular disease, EEG had shown right hemisphere slowing. She has been seizure-free since February 2020 on Dilantin 100mg  TID. We agreed to continue on current dose Donepezil 5mg  daily for dementia. Continue 24/7 supervision/supportive care. She does not drive. Follow-up in 6-8 months, they  know to call for any changes.    Thank you for allowing me to participate in her care.  Please do not hesitate to call for any questions or concerns.   Ellouise Newer, M.D.   CC: Dr. Warrick Parisian

## 2019-08-08 NOTE — Patient Instructions (Signed)
Continue all your medications. Continue 24/7 care. Follow-up in 6-8 months, call for any changes.  Seizure Precautions: 1. If medication has been prescribed for you to prevent seizures, take it exactly as directed.  Do not stop taking the medicine without talking to your doctor first, even if you have not had a seizure in a long time.   2. Avoid activities in which a seizure would cause danger to yourself or to others.  Don't operate dangerous machinery, swim alone, or climb in high or dangerous places, such as on ladders, roofs, or girders.  Do not drive unless your doctor says you may.  3. If you have any warning that you may have a seizure, lay down in a safe place where you can't hurt yourself.    4.  No driving for 6 months from last seizure, as per [facility].   Please refer to the following link on the [facility] website for more information: http://[email or web address]/[email or web address]/drivingu.cfm   5.  Maintain good sleep hygiene.  6.  Contact your doctor if you have any problems that may be related to the medicine you are taking.  7.  Call 911 and bring the patient back to the ED if:        A.  The seizure lasts longer than 5 minutes.       B.  The patient doesn't awaken shortly after the seizure  C.  The patient has new problems such as difficulty seeing, speaking or moving  D.  The patient was injured during the seizure  E.  The patient has a temperature over 102 F (39C)  F.  The patient vomited and now is having trouble breathing

## 2019-08-10 ENCOUNTER — Other Ambulatory Visit: Payer: Self-pay

## 2019-08-10 ENCOUNTER — Encounter: Payer: Self-pay | Admitting: Family Medicine

## 2019-08-10 ENCOUNTER — Ambulatory Visit (INDEPENDENT_AMBULATORY_CARE_PROVIDER_SITE_OTHER): Payer: Medicare Other | Admitting: Family Medicine

## 2019-08-10 VITALS — BP 124/73 | HR 72 | Temp 97.0°F | Ht 62.0 in | Wt 122.0 lb

## 2019-08-10 DIAGNOSIS — I10 Essential (primary) hypertension: Secondary | ICD-10-CM | POA: Diagnosis not present

## 2019-08-10 DIAGNOSIS — I5032 Chronic diastolic (congestive) heart failure: Secondary | ICD-10-CM | POA: Diagnosis not present

## 2019-08-10 DIAGNOSIS — E785 Hyperlipidemia, unspecified: Secondary | ICD-10-CM

## 2019-08-10 DIAGNOSIS — I480 Paroxysmal atrial fibrillation: Secondary | ICD-10-CM | POA: Diagnosis not present

## 2019-08-10 DIAGNOSIS — E039 Hypothyroidism, unspecified: Secondary | ICD-10-CM

## 2019-08-10 NOTE — Progress Notes (Signed)
BP 124/73   Pulse 72   Temp (!) 97 F (36.1 C)   Ht 5' 2"  (1.575 m)   Wt 122 lb (55.3 kg)   SpO2 97%   BMI 22.31 kg/m    Subjective:   Patient ID: [name], female    DOB: Jun 30, 1932, 86 y.o.   MRN: [id number]  HPI: ANNALISSE MINKOFF is a 86 y.o. female presenting on 08/10/2019 for Medical Management of Chronic Issues   HPI Hypothyroidism recheck Patient is coming in for thyroid recheck today as well. They deny any issues with hair changes or heat or cold problems or diarrhea or constipation. They deny any chest pain or palpitations. They are currently on levothyroxine 75 micrograms   Hypertension Patient is currently on metoprolol and digoxin and diltiazem and hydralazine, and their blood pressure today is 124/73. Patient denies any lightheadedness or dizziness. Patient denies headaches, blurred vision, chest pains, shortness of breath, or weakness. Denies any side effects from medication and is content with current medication.   Hyperlipidemia Patient is coming in for recheck of his hyperlipidemia. The patient is currently taking atorvastatin. They deny any issues with myalgias or history of liver damage from it. They deny any focal numbness or weakness or chest pain.   CHF and A. Fib Patient is currently on diltiazem and digoxin and metoprolol for CHF and A. fib.  Relevant past medical, surgical, family and social history reviewed and updated as indicated. Interim medical history since our last visit reviewed. Allergies and medications reviewed and updated.  Review of Systems  Constitutional: Negative for chills and fever.  Eyes: Negative for visual disturbance.  Respiratory: Negative for chest tightness and shortness of breath.   Cardiovascular: Negative for chest pain and leg swelling.  Musculoskeletal: Negative for back pain and gait problem.  Skin: Negative for rash.  Neurological: Negative for dizziness, light-headedness and headaches.  Psychiatric/Behavioral:  Negative for agitation and behavioral problems.  All other systems reviewed and are negative.   Per HPI unless specifically indicated above   Allergies as of 08/10/2019      Reactions   Penicillins Rash, Other (See Comments)   Has patient had a PCN reaction causing immediate rash, facial/tongue/throat swelling, SOB or lightheadedness with hypotension: No Has patient had a PCN reaction causing severe rash involving mucus membranes or skin necrosis: No Has patient had a PCN reaction that required hospitalization No Has patient had a PCN reaction occurring within the last 10 years: No If all of the above answers are "NO", then may proceed with Cephalosporin use.      Medication List       Accurate as of August 10, 2019 11:59 PM. If you have any questions, ask your nurse or doctor.        ARTIFICIAL TEAR OP Apply 2 drops to eye daily as needed (Dryness). To the left eye   atorvastatin 20 MG tablet Commonly known as: LIPITOR TAKE ONE TABLET AT BEDTIME   calcium carbonate 1250 (500 Ca) MG chewable tablet Commonly known as: OS-CAL Chew 1 tablet by mouth daily.   clotrimazole-betamethasone cream Commonly known as: LOTRISONE Apply topically 2 times daily to affected areas until rash clears   digoxin 0.125 MG tablet Commonly known as: LANOXIN Take 0.5 tablets (0.0625 mg total) by mouth daily.   diltiazem 240 MG 24 hr capsule Commonly known as: CARDIZEM CD TAKE (1) CAPSULE DAILY   donepezil 5 MG tablet Commonly known as: ARICEPT TAKE ONE TABLET AT BEDTIME FOR  MEMORY   GNP Melatonin Maximum Strength 5 MG Tabs Generic drug: melatonin TAKE ONE TABLET AT BEDTIME   hydrALAZINE 10 MG tablet Commonly known as: APRESOLINE TAKE 1 TABLET 2 TIMES A DAY   levothyroxine 75 MCG tablet Commonly known as: SYNTHROID Take 1 tablet (75 mcg total) by mouth daily.   loperamide 2 MG capsule Commonly known as: IMODIUM TAKE (1) CAPSULE FOUR TIMES DAILY AS NEEDED FOR DIARRHEA OR LOOSE  STOOLS   metoprolol succinate 100 MG 24 hr tablet Commonly known as: TOPROL-XL TAKE 1 TABLET EVERY DAY. Take with or immediately following a meal.   multivitamin with minerals Tabs tablet Take 1 tablet by mouth every morning.   phenytoin 100 MG ER capsule Commonly known as: DILANTIN Take 1 capsule (100 mg total) by mouth 3 (three) times daily.   Savaysa 30 MG Tabs tablet Generic drug: edoxaban TAKE 1 TABLET ONCE A DAY FOR AFIB   tamsulosin 0.4 MG Caps capsule Commonly known as: FLOMAX TAKE (1) CAPSULE DAILY   traZODone 50 MG tablet Commonly known as: DESYREL TAKE 1/2 TO 1 TABLET AT BEDTIME AS NEEDED FOR SLEEP   Tylenol 325 MG Caps Generic drug: Acetaminophen Take 650 mg by mouth every 6 (six) hours as needed for mild pain.        Objective:   BP 124/73   Pulse 72   Temp (!) 97 F (36.1 C)   Ht 5' 2"  (1.575 m)   Wt 122 lb (55.3 kg)   SpO2 97%   BMI 22.31 kg/m   Wt Readings from Last 3 Encounters:  08/10/19 122 lb (55.3 kg)  08/08/19 121 lb 12.8 oz (55.2 kg)  05/09/19 123 lb (55.8 kg)    Physical Exam Vitals and nursing note reviewed.  Constitutional:      General: She is not in acute distress.    Appearance: She is well-developed. She is not diaphoretic.  Eyes:     Conjunctiva/sclera: Conjunctivae normal.  Cardiovascular:     Rate and Rhythm: Normal rate. Rhythm irregular.     Heart sounds: Normal heart sounds. No murmur heard.   Pulmonary:     Effort: Pulmonary effort is normal. No respiratory distress.     Breath sounds: Normal breath sounds. No wheezing.  Musculoskeletal:        General: Swelling: Trace.  Skin:    General: Skin is warm and dry.     Findings: No rash.  Neurological:     Mental Status: She is alert and oriented to person, place, and time.     Coordination: Coordination normal.  Psychiatric:        Behavior: Behavior normal.       Assessment & Plan:   Problem List Items Addressed This Visit      Cardiovascular and  Mediastinum   Paroxysmal atrial fibrillation (HCC)   Relevant Orders   CBC with Differential/Platelet (Completed)   Chronic diastolic CHF (congestive heart failure) (HCC)   Relevant Orders   CBC with Differential/Platelet (Completed)   HTN (hypertension)   Relevant Orders   CBC with Differential/Platelet (Completed)   CMP14+EGFR (Completed)     Endocrine   Hypothyroidism - Primary   Relevant Orders   TSH (Completed)     Other   Dyslipidemia   Relevant Orders   Lipid panel (Completed)      Seems to be stable, no change in medication, breathing seems to be doing well, will monitor for now. Follow up plan: Return in about 3 months (around  11/10/2019), or if symptoms worsen or fail to improve, for Thyroid and hypertension and A. fib.  Counseling provided for all of the vaccine components Orders Placed This Encounter  Procedures  . CBC with Differential/Platelet  . CMP14+EGFR  . Lipid panel  . TSH    Caryl Pina, MD [facility] 08/19/2019, 10:16 PM

## 2019-08-11 LAB — CMP14+EGFR
ALT: 16 IU/L (ref 0–32)
AST: 21 IU/L (ref 0–40)
Albumin/Globulin Ratio: 1.5 (ref 1.2–2.2)
Albumin: 4 g/dL (ref 3.6–4.6)
Alkaline Phosphatase: 64 IU/L (ref 48–121)
BUN/Creatinine Ratio: 22 (ref 12–28)
BUN: 15 mg/dL (ref 8–27)
Bilirubin Total: 0.2 mg/dL (ref 0.0–1.2)
CO2: 21 mmol/L (ref 20–29)
Calcium: 9.4 mg/dL (ref 8.7–10.3)
Chloride: 98 mmol/L (ref 96–106)
Creatinine, Ser: 0.68 mg/dL (ref 0.57–1.00)
GFR calc Af Amer: 92 mL/min/{1.73_m2} (ref >59)
GFR calc non Af Amer: 80 mL/min/{1.73_m2} (ref >59)
Globulin, Total: 2.6 g/dL (ref 1.5–4.5)
Glucose: 92 mg/dL (ref 65–99)
Potassium: 4.7 mmol/L (ref 3.5–5.2)
Sodium: 138 mmol/L (ref 134–144)
Total Protein: 6.6 g/dL (ref 6.0–8.5)

## 2019-08-11 LAB — CBC WITH DIFFERENTIAL/PLATELET
Basophils Absolute: 0.1 10*3/uL (ref 0.0–0.2)
Basos: 1 %
EOS (ABSOLUTE): 0.1 10*3/uL (ref 0.0–0.4)
Eos: 1 %
Hematocrit: 37.6 % (ref 34.0–46.6)
Hemoglobin: 12.5 g/dL (ref 11.1–15.9)
Immature Grans (Abs): 0 10*3/uL (ref 0.0–0.1)
Immature Granulocytes: 0 %
Lymphocytes Absolute: 1.6 10*3/uL (ref 0.7–3.1)
Lymphs: 16 %
MCH: 31.8 pg (ref 26.6–33.0)
MCHC: 33.2 g/dL (ref 31.5–35.7)
MCV: 96 fL (ref 79–97)
Monocytes Absolute: 1 10*3/uL — ABNORMAL HIGH (ref 0.1–0.9)
Monocytes: 10 %
Neutrophils Absolute: 7.2 10*3/uL — ABNORMAL HIGH (ref 1.4–7.0)
Neutrophils: 72 %
Platelets: 281 10*3/uL (ref 150–450)
RBC: 3.93 x10E6/uL (ref 3.77–5.28)
RDW: 12.4 % (ref 11.7–15.4)
WBC: 9.9 10*3/uL (ref 3.4–10.8)

## 2019-08-11 LAB — LIPID PANEL
Chol/HDL Ratio: 1.7 ratio (ref 0.0–4.4)
Cholesterol, Total: 180 mg/dL (ref 100–199)
HDL: 104 mg/dL (ref >39)
LDL Chol Calc (NIH): 60 mg/dL (ref 0–99)
Triglycerides: 89 mg/dL (ref 0–149)
VLDL Cholesterol Cal: 16 mg/dL (ref 5–40)

## 2019-08-11 LAB — TSH: TSH: 3.58 u[IU]/mL (ref 0.450–4.500)

## 2019-08-14 ENCOUNTER — Telehealth: Payer: Self-pay | Admitting: Family Medicine

## 2019-08-14 NOTE — Telephone Encounter (Signed)
Informed to try Tuck's pads and prep H until Dr. Warrick Parisian returns on Thursday.  Bleeding is bright red after BM. Only small amount. No clots. Only wanted advise from Dettinger.

## 2019-08-14 NOTE — Telephone Encounter (Signed)
  Prescription Request  08/14/2019  What is the name of the medication or equipment? Pt saw Dr Dettinger last Friday and he was supposed to call in hemorrhoid meds. Pharmacy hasn't received rx yet  Have you contacted your pharmacy to request a refill? (if applicable) na  Which pharmacy would you like this sent to? [facility]   Patient notified that their request is being sent to the clinical staff for review and that they should receive a response within 2 business days.

## 2019-08-15 ENCOUNTER — Other Ambulatory Visit: Payer: Self-pay | Admitting: Family Medicine

## 2019-08-16 MED ORDER — HYDROCORTISONE ACETATE 25 MG RE SUPP: 25.0000 mg | Freq: Two times a day (BID) | RECTAL | 0 refills | Status: AC

## 2019-08-16 NOTE — Telephone Encounter (Signed)
Sent a hemorrhoid cream for them

## 2019-08-16 NOTE — Telephone Encounter (Signed)
No answer, no voicemail.

## 2019-08-17 ENCOUNTER — Ambulatory Visit: Payer: Medicare Other | Admitting: Neurology

## 2019-08-29 ENCOUNTER — Other Ambulatory Visit: Payer: Medicare Other

## 2019-08-29 DIAGNOSIS — N390 Urinary tract infection, site not specified: Secondary | ICD-10-CM | POA: Diagnosis not present

## 2019-08-29 LAB — URINALYSIS, COMPLETE
Bilirubin, UA: NEGATIVE
Glucose, UA: NEGATIVE
Nitrite, UA: POSITIVE — AB
Protein,UA: NEGATIVE
Specific Gravity, UA: 1.025 (ref 1.005–1.030)
Urobilinogen, Ur: 0.2 mg/dL (ref 0.2–1.0)
pH, UA: 5.5 (ref 5.0–7.5)

## 2019-08-29 LAB — MICROSCOPIC EXAMINATION: RBC, Urine: NONE SEEN /hpf (ref 0–2)

## 2019-08-30 ENCOUNTER — Other Ambulatory Visit: Payer: Self-pay | Admitting: Family Medicine

## 2019-08-30 ENCOUNTER — Telehealth: Payer: Self-pay | Admitting: Family Medicine

## 2019-08-30 MED ORDER — CEPHALEXIN 500 MG PO CAPS: 500.0000 mg | ORAL_CAPSULE | Freq: Four times a day (QID) | ORAL | 0 refills | Status: DC

## 2019-08-30 NOTE — Telephone Encounter (Signed)
Pts son called requesting that Dr Dettinger review pts lab results and let him know if pt needs to be taking any medications for her issue. Also wanted to let Dr Dettinger know that pt is still having issues with  Hemroids. Wants to know if Dr Dettinger can call Rx in for that too?

## 2019-08-30 NOTE — Telephone Encounter (Signed)
patient's urine shows possible UTI, sent Keflex for her.

## 2019-08-31 MED ORDER — HEMORRHOIDAL 0.25 % RE SUPP: 1.0000 | Freq: Two times a day (BID) | RECTAL | 0 refills | Status: DC

## 2019-08-31 NOTE — Telephone Encounter (Signed)
Patient aware and verbalized understanding. °

## 2019-08-31 NOTE — Telephone Encounter (Signed)
Reviewed labs already, sent an antibiotic already for the urine. I also just sent a hemorrhoid cream for her

## 2019-09-01 LAB — URINE CULTURE

## 2019-09-03 ENCOUNTER — Telehealth: Payer: Self-pay | Admitting: Family Medicine

## 2019-09-03 MED ORDER — SULFAMETHOXAZOLE-TRIMETHOPRIM 800-160 MG PO TABS: 1.0000 | ORAL_TABLET | Freq: Two times a day (BID) | ORAL | 0 refills | Status: DC

## 2019-09-03 NOTE — Telephone Encounter (Signed)
Patient aware of antibiotic change

## 2019-09-03 NOTE — Telephone Encounter (Signed)
Patient's urine grew a bacteria that was resistant to Keflex, I sent her a different antibiotic bactrim, stop the Keflex and start bactrim

## 2019-09-11 ENCOUNTER — Telehealth: Payer: Self-pay | Admitting: Family Medicine

## 2019-09-11 NOTE — Telephone Encounter (Signed)
Docusate is mainly to help with constipation, if she is having normal bowel movements then she does not need to take it.  If they feel like the atorvastatin is not helping without causing issues then please have her try taking it every other day and see if that makes a difference.  Anxiety medications would have to be discussed in the visit and discussed the safety and protocols and what types of anxiety medication would be safe for her to take.  She grew E. coli in her urine which was resistant to the Keflex so she does need to finish the Bactrim. Caryl Pina, MD [facility] 09/11/2019, 10:04 PM

## 2019-09-12 NOTE — Telephone Encounter (Signed)
Pt rc for nurse please call back

## 2019-09-12 NOTE — Telephone Encounter (Signed)
Son, Ray answered but unavailable to talk because of work. He agreed that I could call back and leave voicemail.  Informed him of all Dr. Merita Norton instructions. Asked to return call with any questions and to schedule an appt to discuss anxiety if needed.

## 2019-09-14 ENCOUNTER — Encounter: Payer: Self-pay | Admitting: Family Medicine

## 2019-09-14 ENCOUNTER — Other Ambulatory Visit: Payer: Self-pay

## 2019-09-14 ENCOUNTER — Ambulatory Visit (INDEPENDENT_AMBULATORY_CARE_PROVIDER_SITE_OTHER): Payer: Medicare Other | Admitting: Family Medicine

## 2019-09-14 VITALS — BP 133/64 | HR 84 | Temp 97.0°F | Ht 62.0 in | Wt 119.0 lb

## 2019-09-14 DIAGNOSIS — F0281 Dementia in other diseases classified elsewhere with behavioral disturbance: Secondary | ICD-10-CM

## 2019-09-14 DIAGNOSIS — G301 Alzheimer's disease with late onset: Secondary | ICD-10-CM | POA: Diagnosis not present

## 2019-09-14 DIAGNOSIS — F02818 Dementia in other diseases classified elsewhere, unspecified severity, with other behavioral disturbance: Secondary | ICD-10-CM

## 2019-09-14 MED ORDER — SERTRALINE HCL 50 MG PO TABS: 50.0000 mg | ORAL_TABLET | Freq: Every day | ORAL | 2 refills | Status: AC

## 2019-09-14 MED ORDER — ATORVASTATIN CALCIUM 20 MG PO TABS: 20.0000 mg | ORAL_TABLET | ORAL | 1 refills | Status: AC

## 2019-09-14 NOTE — Progress Notes (Signed)
BP (!) 156/86   Pulse 84   Temp (!) 97 F (36.1 C)   Ht 5\' 2"  (1.575 m)   Wt 119 lb (54 kg)   SpO2 98%   BMI 21.77 kg/m    Subjective:   Patient ID: [name], female    DOB: May 28, 1932, 86 y.o.   MRN: [id number]  HPI: [name] is a 86 y.o. female presenting on 09/14/2019 for Medical Management of Chronic Issues and Anxiety   HPI Patient is coming in today with family member to discuss irritability associated with Alzheimer's and anxiety associated with Alzheimer's as well.  Family said she has been more and more irritable and there is wondering if something can help calm her down.  This a lot of it happens more in the evening and having difficulty sleeping but also sometimes during the daytime as well.  Her Alzheimer's has been worse and that is when they are noticing more irritability.  Relevant past medical, surgical, family and social history reviewed and updated as indicated. Interim medical history since our last visit reviewed. Allergies and medications reviewed and updated.  Review of Systems  Constitutional: Negative for chills and fever.  Eyes: Negative for visual disturbance.  Respiratory: Negative for chest tightness and shortness of breath.   Cardiovascular: Negative for chest pain and leg swelling.  Musculoskeletal: Negative for back pain and gait problem.  Skin: Negative for rash.  Neurological: Negative for light-headedness and headaches.  Psychiatric/Behavioral: Positive for confusion, dysphoric mood and sleep disturbance. Negative for agitation, behavioral problems, self-injury and suicidal ideas. The patient is nervous/anxious.   All other systems reviewed and are negative.   Per HPI unless specifically indicated above   Allergies as of 09/14/2019      Reactions   Penicillins Rash, Other (See Comments)   Has patient had a PCN reaction causing immediate rash, facial/tongue/throat swelling, SOB or lightheadedness with hypotension: No Has patient  had a PCN reaction causing severe rash involving mucus membranes or skin necrosis: No Has patient had a PCN reaction that required hospitalization No Has patient had a PCN reaction occurring within the last 10 years: No If all of the above answers are "NO", then may proceed with Cephalosporin use.      Medication List       Accurate as of September 14, 2019 11:48 AM. If you have any questions, ask your nurse or doctor.        STOP taking these medications   cephALEXin 500 MG capsule Commonly known as: KEFLEX Stopped by: Fransisca Kaufmann Rudell Marlowe, MD     TAKE these medications   ARTIFICIAL TEAR OP Apply 2 drops to eye daily as needed (Dryness). To the left eye   atorvastatin 20 MG tablet Commonly known as: LIPITOR TAKE ONE TABLET AT BEDTIME   calcium carbonate 1250 (500 Ca) MG chewable tablet Commonly known as: OS-CAL Chew 1 tablet by mouth daily.   clotrimazole-betamethasone cream Commonly known as: LOTRISONE Apply topically 2 times daily to affected areas until rash clears   digoxin 0.125 MG tablet Commonly known as: LANOXIN Take 0.5 tablets (0.0625 mg total) by mouth daily.   diltiazem 240 MG 24 hr capsule Commonly known as: CARDIZEM CD TAKE (1) CAPSULE DAILY   donepezil 5 MG tablet Commonly known as: ARICEPT TAKE ONE TABLET AT BEDTIME FOR MEMORY   GNP Melatonin Maximum Strength 5 MG Tabs Generic drug: melatonin TAKE ONE TABLET AT BEDTIME   Hemorrhoidal 0.25 % suppository Generic drug: phenylephrine  Place 1 suppository rectally 2 (two) times daily.   hydrALAZINE 10 MG tablet Commonly known as: APRESOLINE TAKE 1 TABLET 2 TIMES A DAY   hydrocortisone 25 MG suppository Commonly known as: ANUSOL-HC Place 1 suppository (25 mg total) rectally 2 (two) times daily.   levothyroxine 75 MCG tablet Commonly known as: SYNTHROID Take 1 tablet (75 mcg total) by mouth daily.   loperamide 2 MG capsule Commonly known as: IMODIUM TAKE (1) CAPSULE FOUR TIMES DAILY AS  NEEDED FOR DIARRHEA OR LOOSE STOOLS   metoprolol succinate 100 MG 24 hr tablet Commonly known as: TOPROL-XL TAKE 1 TABLET EVERY DAY FOLLOWING A MEAL   multivitamin with minerals Tabs tablet Take 1 tablet by mouth every morning.   phenytoin 100 MG ER capsule Commonly known as: DILANTIN Take 1 capsule (100 mg total) by mouth 3 (three) times daily.   Savaysa 30 MG Tabs tablet Generic drug: edoxaban TAKE 1 TABLET ONCE A DAY FOR AFIB   sulfamethoxazole-trimethoprim 800-160 MG tablet Commonly known as: BACTRIM DS Take 1 tablet by mouth 2 (two) times daily.   tamsulosin 0.4 MG Caps capsule Commonly known as: FLOMAX TAKE (1) CAPSULE DAILY   traZODone 50 MG tablet Commonly known as: DESYREL TAKE 1/2 TO 1 TABLET AT BEDTIME AS NEEDED FOR SLEEP   Tylenol 325 MG Caps Generic drug: Acetaminophen Take 650 mg by mouth every 6 (six) hours as needed for mild pain.        Objective:   BP (!) 156/86   Pulse 84   Temp (!) 97 F (36.1 C)   Ht 5\' 2"  (1.575 m)   Wt 119 lb (54 kg)   SpO2 98%   BMI 21.77 kg/m   Wt Readings from Last 3 Encounters:  09/14/19 119 lb (54 kg)  08/10/19 122 lb (55.3 kg)  08/08/19 121 lb 12.8 oz (55.2 kg)    Physical Exam Vitals and nursing note reviewed.  Constitutional:      General: She is not in acute distress.    Appearance: She is well-developed. She is not diaphoretic.  Eyes:     Conjunctiva/sclera: Conjunctivae normal.  Cardiovascular:     Rate and Rhythm: Normal rate and regular rhythm.     Heart sounds: Normal heart sounds. No murmur heard.   Pulmonary:     Effort: Pulmonary effort is normal. No respiratory distress.     Breath sounds: Normal breath sounds. No wheezing.  Musculoskeletal:        General: No tenderness. Normal range of motion.  Skin:    General: Skin is warm and dry.     Findings: No rash.  Neurological:     Mental Status: She is alert and oriented to person, place, and time.     Coordination: Coordination normal.    Psychiatric:        Mood and Affect: Mood is anxious and depressed.        Behavior: Behavior normal.        Thought Content: Thought content does not include suicidal ideation. Thought content does not include suicidal plan.       Assessment & Plan:   Problem List Items Addressed This Visit    None    Visit Diagnoses    Late onset Alzheimer's disease with behavioral disturbance ([place])    -  Primary   Relevant Medications   sertraline (ZOLOFT) 50 MG tablet      Will start sertraline to help with patient's anxiousness and irritability because of the dementia.  We will also reduce the Lipitor to every other day. Follow up plan: Return in about 4 weeks (around 10/12/2019), or if symptoms worsen or fail to improve.  Counseling provided for all of the vaccine components No orders of the defined types were placed in this encounter.   Caryl Pina, MD [facility] 09/14/2019, 11:48 AM

## 2019-09-21 ENCOUNTER — Other Ambulatory Visit: Payer: Self-pay | Admitting: Family Medicine

## 2019-09-27 ENCOUNTER — Other Ambulatory Visit: Payer: Self-pay | Admitting: Cardiology

## 2019-09-27 DIAGNOSIS — I480 Paroxysmal atrial fibrillation: Secondary | ICD-10-CM

## 2019-09-27 NOTE — Telephone Encounter (Signed)
Refill for Savaysa received Last office visit: 04/11/19 CrCl: 52 mL/min Indication: A Fib  Patient has been on Savaysa 30mg .  Qualifies for 60mg  due to CrCl >50.  Has appointment on 10/24/19.  Will check BMP at that time.

## 2019-10-19 ENCOUNTER — Ambulatory Visit: Payer: Medicare Other

## 2019-10-23 NOTE — Progress Notes (Signed)
Cardiology Office Note   Date:  10/24/2019   ID:  [name], DOB 16-Sep-1932, MRN [id number]  PCP:  Dettinger, Fransisca Kaufmann, MD  Cardiologist:   Minus Breeding, MD   No chief complaint on file.     History of Present Illness: [name] is a 86 y.o. female who presents for follow up of atrial fib.  Since I last saw her she has had no new problems.  She does not notice her heart beating out of rhythm.  Has had no presyncope or syncope.  She does have confusion at baseline but does not recall any cardiovascular symptoms.  She has no shortness of breath, PND or orthopnea.  She has no chest pressure, neck or arm discomfort.  She tolerates anticoagulation.  Past Medical History:  Diagnosis Date  . Anemia   . Atrial fibrillation ([place])   . Calf pain    September, 2012, at rest  . Carotid bruit    Doppler, December, 2009, no abnormality  . Cataract   . Diverticulosis   . GERD (gastroesophageal reflux disease)   . History of shingles   . HLD (hyperlipidemia)   . HTN (hypertension)   . Hypothyroidism   . Insomnia   . Osteoarthritis   . PONV (postoperative nausea and vomiting)   . Rectal bleeding 2001   diverticulosis and int hemorrhoids on 07/1999 and 02/2010 colonoscopies.  . Seizures ([name])   . Stroke ([place])   . Warfarin anticoagulation     Past Surgical History:  Procedure Laterality Date  . BIOPSY  09/29/2015   Procedure: BIOPSY;  Surgeon: Rogene Houston, MD;  [facility];  Service: Endoscopy;;  gastric  . CATARACT EXTRACTION W/PHACO Right 04/15/2014   Procedure: CATARACT EXTRACTION PHACO AND INTRAOCULAR LENS PLACEMENT (IOC);  Surgeon: Tonny Branch, MD;  Location: [facility];  Service: Ophthalmology;  Laterality: Right;  CDE: 13.30  . CATARACT EXTRACTION W/PHACO Left 05/13/2014   Procedure: CATARACT EXTRACTION PHACO AND INTRAOCULAR LENS PLACEMENT LEFT EYE;  Surgeon: Tonny Branch, MD;  Location: [facility];  Service: Ophthalmology;  Laterality: Left;  CDE:13.00  .  CHOLECYSTECTOMY N/A 01/15/2015   Procedure: LAPAROSCOPIC CHOLECYSTECTOMY;  Surgeon: Ralene Ok, MD;  Location: [facility];  Service: General;  Laterality: N/A;  . COLONOSCOPY N/A 09/29/2015   Procedure: COLONOSCOPY;  Surgeon: Rogene Houston, MD;  [facility];  Service: Endoscopy;  Laterality: N/A;  . COLONOSCOPY    . ESOPHAGOGASTRODUODENOSCOPY N/A 01/13/2015   Procedure: ESOPHAGOGASTRODUODENOSCOPY (EGD);  Surgeon: Jerene Bears, MD;  Location: [facility] ENDOSCOPY;  Service: Endoscopy;  Laterality: N/A;  . ESOPHAGOGASTRODUODENOSCOPY N/A 09/29/2015   Procedure: ESOPHAGOGASTRODUODENOSCOPY (EGD);  Surgeon: Rogene Houston, MD;  [facility];  Service: Endoscopy;  Laterality: N/A;  12:15  . GALLBLADDER SURGERY    . LEFT HEART CATH AND CORONARY ANGIOGRAPHY N/A 06/22/2016   Procedure: Left Heart Cath and Coronary Angiography;  Surgeon: Troy Sine, MD;  Location: [facility];  Service: Cardiovascular;  Laterality: N/A;  . SHOULDER OPEN ROTATOR CUFF REPAIR     bilateral  . TOTAL KNEE ARTHROPLASTY  05/26/10   right  . TOTAL KNEE ARTHROPLASTY  11/17/2010   Procedure: TOTAL KNEE ARTHROPLASTY;  Surgeon: Mauri Pole;  Location: [facility];  Service: Orthopedics;  Laterality: Left;  . TOTAL KNEE ARTHROPLASTY     Right  . TOTAL SHOULDER REPLACEMENT  01/2010   left     Current Outpatient Medications  Medication Sig Dispense Refill  .  Acetaminophen (TYLENOL) 325 MG CAPS Take 650 mg by mouth every 6 (six) hours as needed for mild pain.    Marland Kitchen ARTIFICIAL TEAR OP Apply 2 drops to eye daily as needed (Dryness). To the left eye    . atorvastatin (LIPITOR) 20 MG tablet Take 1 tablet (20 mg total) by mouth every other day. 45 tablet 1  . busPIRone (BUSPAR) 10 MG tablet Take 1 tablet (10 mg total) by mouth 2 (two) times daily. 60 tablet 3  . calcium carbonate (OS-CAL) 1250 (500 Ca) MG chewable tablet Chew 1 tablet by mouth daily.    . clotrimazole-betamethasone (LOTRISONE) cream Apply  topically 2 times daily to affected areas until rash clears 45 g 0  . digoxin (LANOXIN) 0.125 MG tablet Take 0.5 tablets (0.0625 mg total) by mouth daily. 45 tablet 3  . diltiazem (CARDIZEM CD) 240 MG 24 hr capsule TAKE (1) CAPSULE DAILY 30 capsule 7  . donepezil (ARICEPT) 5 MG tablet TAKE ONE TABLET AT BEDTIME FOR MEMORY 90 tablet 3  . edoxaban (SAVAYSA) 30 MG TABS tablet Take 1 tablet (30 mg total) by mouth daily. 30 tablet 0  . GNP MELATONIN MAXIMUM STRENGTH 5 MG TABS TAKE ONE TABLET AT BEDTIME 30 tablet 5  . hydrALAZINE (APRESOLINE) 10 MG tablet TAKE 1 TABLET 2 TIMES A DAY 180 tablet 0  . hydrocortisone (ANUSOL-HC) 25 MG suppository Place 1 suppository (25 mg total) rectally 2 (two) times daily. 12 suppository 0  . levothyroxine (SYNTHROID) 75 MCG tablet TAKE 1 TABLET ONCE DAILY 90 tablet 3  . loperamide (IMODIUM) 2 MG capsule TAKE (1) CAPSULE FOUR TIMES DAILY AS NEEDED FOR DIARRHEA OR LOOSE STOOLS 120 capsule 0  . metoprolol succinate (TOPROL-XL) 100 MG 24 hr tablet TAKE 1 TABLET EVERY DAY FOLLOWING A MEAL 90 tablet 0  . Multiple Vitamin (MULTIVITAMIN WITH MINERALS) TABS tablet Take 1 tablet by mouth every morning.    . phenylephrine (HEMORRHOIDAL) 0.25 % suppository Place 1 suppository rectally 2 (two) times daily. 12 suppository 0  . phenytoin (DILANTIN) 100 MG ER capsule Take 1 capsule (100 mg total) by mouth 3 (three) times daily. 270 capsule 3  . sertraline (ZOLOFT) 50 MG tablet Take 1 tablet (50 mg total) by mouth daily. 30 tablet 2  . tamsulosin (FLOMAX) 0.4 MG CAPS capsule TAKE (1) CAPSULE DAILY 90 capsule 0  . traZODone (DESYREL) 50 MG tablet TAKE 1/2 TO 1 TABLET AT BEDTIME AS NEEDED FOR SLEEP 30 tablet 0   No current facility-administered medications for this visit.    Allergies:   Penicillins    ROS:  Please see the history of present illness.   Otherwise, review of systems are positive for none.   All other systems are reviewed and negative.    PHYSICAL EXAM: VS:  BP  102/60   Pulse 83   Ht 5\' 2"  (1.575 m)   Wt 121 lb (54.9 kg)   BMI 22.13 kg/m  , BMI Body mass index is 22.13 kg/m. GENERAL:  Well appearing NECK:  No jugular venous distention, waveform within normal limits, carotid upstroke brisk and symmetric, no bruits, no thyromegaly LUNGS:  Clear to auscultation bilaterally CHEST:  Unremarkable HEART:  PMI not displaced or sustained,S1 and S2 within normal limits, no S3,  no clicks, no rubs, no murmurs, irregular  ABD:  Flat, positive bowel sounds normal in frequency in pitch, no bruits, no rebound, no guarding, no midline pulsatile mass, no hepatomegaly, no splenomegaly EXT:  2 plus pulses throughout, no  edema, no cyanosis no clubbing   EKG:  EKG is  ordered today. The ekg ordered today demonstrates atrial fibrillation, rate 83, leftward axis, poor anterior R wave progression, old anteroseptal infarct, no change previous.   Recent Labs: 08/10/2019: ALT 16; BUN 15; Creatinine, Ser 0.68; Hemoglobin 12.5; Platelets 281; Potassium 4.7; Sodium 138; TSH 3.580    Lipid Panel    Component Value Date/Time   CHOL 180 08/10/2019 1317   TRIG 89 08/10/2019 1317   HDL 104 08/10/2019 1317   CHOLHDL 1.7 08/10/2019 1317   CHOLHDL 2.1 01/31/2018 0557   VLDL 10 01/31/2018 0557   LDLCALC 60 08/10/2019 1317   LDLDIRECT 67 02/01/2017 1120      Wt Readings from Last 3 Encounters:  10/24/19 121 lb (54.9 kg)  10/24/19 120 lb (54.4 kg)  09/14/19 119 lb (54 kg)      Other studies Reviewed: Additional studies/ records that were reviewed today include: Labs. Review of the above records demonstrates:  Please see elsewhere in the note.     ASSESSMENT AND PLAN:  ATRIAL FIB:   [name] has a CHA2DS2 - VASc score of 6.    She tolerates anticoagulation.  She has had good rate control.  No change in therapy.   HTN:  Blood pressure is controlled.  No change in therapy.    LEG PAIN:   She had normal ABIs.  She is not complaining of this consistently  now.  COVID EDUCATION: She has been vaccinated.  Current medicines are reviewed at length with the patient today.  The patient does not have concerns regarding medicines.  The following changes have been made:  None  Labs/ tests ordered today include:   None  No orders of the defined types were placed in this encounter.    Disposition:   FU with me in 12 months    Signed, Minus Breeding, MD  10/24/2019 1:43 PM    [facility]

## 2019-10-24 ENCOUNTER — Other Ambulatory Visit: Payer: Self-pay | Admitting: Cardiology

## 2019-10-24 ENCOUNTER — Encounter: Payer: Self-pay | Admitting: Family Medicine

## 2019-10-24 ENCOUNTER — Ambulatory Visit (INDEPENDENT_AMBULATORY_CARE_PROVIDER_SITE_OTHER): Payer: Medicare Other | Admitting: Family Medicine

## 2019-10-24 ENCOUNTER — Other Ambulatory Visit: Payer: Self-pay

## 2019-10-24 ENCOUNTER — Encounter: Payer: Self-pay | Admitting: Cardiology

## 2019-10-24 ENCOUNTER — Ambulatory Visit (INDEPENDENT_AMBULATORY_CARE_PROVIDER_SITE_OTHER): Payer: Medicare Other | Admitting: Cardiology

## 2019-10-24 ENCOUNTER — Other Ambulatory Visit: Payer: Self-pay | Admitting: Family Medicine

## 2019-10-24 VITALS — BP 102/60 | HR 83 | Ht 62.0 in | Wt 121.0 lb

## 2019-10-24 VITALS — BP 162/82 | HR 75 | Temp 97.0°F | Ht 62.0 in | Wt 120.0 lb

## 2019-10-24 DIAGNOSIS — F0281 Dementia in other diseases classified elsewhere with behavioral disturbance: Secondary | ICD-10-CM

## 2019-10-24 DIAGNOSIS — K640 First degree hemorrhoids: Secondary | ICD-10-CM | POA: Diagnosis not present

## 2019-10-24 DIAGNOSIS — I4819 Other persistent atrial fibrillation: Secondary | ICD-10-CM | POA: Diagnosis not present

## 2019-10-24 DIAGNOSIS — I10 Essential (primary) hypertension: Secondary | ICD-10-CM

## 2019-10-24 DIAGNOSIS — Z23 Encounter for immunization: Secondary | ICD-10-CM | POA: Diagnosis not present

## 2019-10-24 DIAGNOSIS — F02818 Dementia in other diseases classified elsewhere, unspecified severity, with other behavioral disturbance: Secondary | ICD-10-CM

## 2019-10-24 DIAGNOSIS — G301 Alzheimer's disease with late onset: Secondary | ICD-10-CM

## 2019-10-24 DIAGNOSIS — I480 Paroxysmal atrial fibrillation: Secondary | ICD-10-CM

## 2019-10-24 MED ORDER — BUSPIRONE HCL 10 MG PO TABS: 10.0000 mg | ORAL_TABLET | Freq: Two times a day (BID) | ORAL | 3 refills | Status: AC

## 2019-10-24 NOTE — Patient Instructions (Signed)
Medication Instructions:  The current medical regimen is effective;  continue present plan and medications.  *If you need a refill on your cardiac medications before your next appointment, please call your pharmacy*  Follow-Up: At [facility], you and your health needs are our priority.  As part of our continuing mission to provide you with exceptional heart care, we have created designated Provider Care Teams.  These Care Teams include your primary Cardiologist (physician) and Advanced Practice Providers (APPs -  Physician Assistants and Nurse Practitioners) who all work together to provide you with the care you need, when you need it.  We recommend signing up for the patient portal called "MyChart".  Sign up information is provided on this After Visit Summary.  MyChart is used to connect with patients for Virtual Visits (Telemedicine).  Patients are able to view lab/test results, encounter notes, upcoming appointments, etc.  Non-urgent messages can be sent to your provider as well.   To learn more about what you can do with MyChart, go to [email or web address].    Your next appointment:   12 month(s)  The format for your next appointment:   In Person  Provider:   James Hochrein, MD   Thank you for choosing [facility]!!     

## 2019-10-24 NOTE — Progress Notes (Signed)
BP (!) 162/82   Pulse 75   Temp (!) 97 F (36.1 C)   Ht 5\' 2"  (1.575 m)   Wt 120 lb (54.4 kg)   SpO2 96%   BMI 21.95 kg/m    Subjective:   Patient ID: [name], female    DOB: [birth year], 86 y.o.   MRN: [id number]  HPI: [name] is a 86 y.o. female presenting on 10/24/2019 for Medical Management of Chronic Issues, Anxiety, alzheimer's, and Hemorrhoids   HPI Anxiety recheck Patient is coming in for anxiety recheck and memory recheck.  She is currently taking donepezil and phenytoin and trazodone. They have not noticed as much of a difference and says that she is getting more anxious and her memory is getting worse, she is also taking donepezil for memory.  And they just do not feel like they are seeing a big difference with these.  Patient is coming in with some bright red blood on tissue,'s concern for hemorrhoids, she has been intermittent but swallowed get it checked out.  She denies any significant pain down there.  Relevant past medical, surgical, family and social history reviewed and updated as indicated. Interim medical history since our last visit reviewed. Allergies and medications reviewed and updated.  Review of Systems  Constitutional: Negative for chills and fever.  Eyes: Negative for visual disturbance.  Respiratory: Negative for chest tightness and shortness of breath.   Cardiovascular: Negative for chest pain and leg swelling.  Gastrointestinal: Positive for anal bleeding. Negative for blood in stool, constipation, diarrhea, nausea and vomiting.  Musculoskeletal: Negative for back pain and gait problem.  Skin: Negative for rash.  Neurological: Negative for light-headedness and headaches.  Psychiatric/Behavioral: Negative for agitation and behavioral problems.  All other systems reviewed and are negative.   Per HPI unless specifically indicated above   Allergies as of 10/24/2019      Reactions   Penicillins Rash, Other (See Comments)   Has  patient had a PCN reaction causing immediate rash, facial/tongue/throat swelling, SOB or lightheadedness with hypotension: No Has patient had a PCN reaction causing severe rash involving mucus membranes or skin necrosis: No Has patient had a PCN reaction that required hospitalization No Has patient had a PCN reaction occurring within the last 10 years: No If all of the above answers are "NO", then may proceed with Cephalosporin use.      Medication List       Accurate as of October 24, 2019 12:14 PM. If you have any questions, ask your nurse or doctor.        STOP taking these medications   sulfamethoxazole-trimethoprim 800-160 MG tablet Commonly known as: BACTRIM DS Stopped by: Fransisca Kaufmann Priyana Mccarey, MD     TAKE these medications   ARTIFICIAL TEAR OP Apply 2 drops to eye daily as needed (Dryness). To the left eye   atorvastatin 20 MG tablet Commonly known as: LIPITOR Take 1 tablet (20 mg total) by mouth every other day.   busPIRone 10 MG tablet Commonly known as: BUSPAR Take 1 tablet (10 mg total) by mouth 2 (two) times daily. Started by: Fransisca Kaufmann Artemis Koller, MD   calcium carbonate 1250 (500 Ca) MG chewable tablet Commonly known as: OS-CAL Chew 1 tablet by mouth daily.   clotrimazole-betamethasone cream Commonly known as: LOTRISONE Apply topically 2 times daily to affected areas until rash clears   digoxin 0.125 MG tablet Commonly known as: LANOXIN Take 0.5 tablets (0.0625 mg total) by mouth daily.  diltiazem 240 MG 24 hr capsule Commonly known as: CARDIZEM CD TAKE (1) CAPSULE DAILY   donepezil 5 MG tablet Commonly known as: ARICEPT TAKE ONE TABLET AT BEDTIME FOR MEMORY   edoxaban 30 MG Tabs tablet Commonly known as: Savaysa Take 1 tablet (30 mg total) by mouth daily.   GNP Melatonin Maximum Strength 5 MG Tabs Generic drug: melatonin TAKE ONE TABLET AT BEDTIME   Hemorrhoidal 0.25 % suppository Generic drug: phenylephrine Place 1 suppository rectally 2  (two) times daily.   hydrALAZINE 10 MG tablet Commonly known as: APRESOLINE TAKE 1 TABLET 2 TIMES A DAY   hydrocortisone 25 MG suppository Commonly known as: ANUSOL-HC Place 1 suppository (25 mg total) rectally 2 (two) times daily.   levothyroxine 75 MCG tablet Commonly known as: SYNTHROID TAKE 1 TABLET ONCE DAILY   loperamide 2 MG capsule Commonly known as: IMODIUM TAKE (1) CAPSULE FOUR TIMES DAILY AS NEEDED FOR DIARRHEA OR LOOSE STOOLS   metoprolol succinate 100 MG 24 hr tablet Commonly known as: TOPROL-XL TAKE 1 TABLET EVERY DAY FOLLOWING A MEAL   multivitamin with minerals Tabs tablet Take 1 tablet by mouth every morning.   phenytoin 100 MG ER capsule Commonly known as: DILANTIN Take 1 capsule (100 mg total) by mouth 3 (three) times daily.   sertraline 50 MG tablet Commonly known as: Zoloft Take 1 tablet (50 mg total) by mouth daily.   tamsulosin 0.4 MG Caps capsule Commonly known as: FLOMAX TAKE (1) CAPSULE DAILY   traZODone 50 MG tablet Commonly known as: DESYREL TAKE 1/2 TO 1 TABLET AT BEDTIME AS NEEDED FOR SLEEP   Tylenol 325 MG Caps Generic drug: Acetaminophen Take 650 mg by mouth every 6 (six) hours as needed for mild pain.        Objective:   BP (!) 162/82   Pulse 75   Temp (!) 97 F (36.1 C)   Ht 5\' 2"  (1.575 m)   Wt 120 lb (54.4 kg)   SpO2 96%   BMI 21.95 kg/m   Wt Readings from Last 3 Encounters:  10/24/19 120 lb (54.4 kg)  09/14/19 119 lb (54 kg)  08/10/19 122 lb (55.3 kg)    Physical Exam Vitals and nursing note reviewed.  Constitutional:      General: She is not in acute distress.    Appearance: She is well-developed. She is not diaphoretic.  Eyes:     Conjunctiva/sclera: Conjunctivae normal.  Cardiovascular:     Rate and Rhythm: Normal rate and regular rhythm.     Heart sounds: Normal heart sounds. No murmur heard.   Pulmonary:     Effort: Pulmonary effort is normal. No respiratory distress.     Breath sounds: Normal  breath sounds. No wheezing.  Genitourinary:    Rectum: External hemorrhoid (Very small external hemorrhoids, not bleeding actively) present.  Musculoskeletal:        General: No tenderness. Normal range of motion.  Skin:    General: Skin is warm and dry.     Findings: No rash.  Neurological:     Mental Status: She is alert and oriented to person, place, and time.     Coordination: Coordination normal.  Psychiatric:        Behavior: Behavior normal.       Assessment & Plan:   Problem List Items Addressed This Visit    None    Visit Diagnoses    Late onset Alzheimer's disease with behavioral disturbance ([place])    -  Primary  Relevant Medications   busPIRone (BUSPAR) 10 MG tablet   Flu vaccine need       Relevant Orders   Flu Vaccine QUAD High Dose(Fluad) (Completed)   Stage I hemorrhoids          Recommended over-the-counter hemorrhoid cream such as Preparation H and then let us know if not improving.  Recommended BuSpar to help with anxiety, may consider Namenda in the future for memory Follow up plan: Return in about 2 months (around 12/24/2019), or if symptoms worsen or fail to improve, for Recheck memory and anxiety.  Counseling provided for all of the vaccine components Orders Placed This Encounter  Procedures  . Flu Vaccine QUAD High Dose(Fluad)    Caryl Pina, MD [facility] 10/24/2019, 12:14 PM

## 2019-11-08 ENCOUNTER — Ambulatory Visit: Payer: Medicare Other

## 2019-11-08 DIAGNOSIS — B351 Tinea unguium: Secondary | ICD-10-CM | POA: Diagnosis not present

## 2019-11-08 DIAGNOSIS — L84 Corns and callosities: Secondary | ICD-10-CM | POA: Diagnosis not present

## 2019-11-08 DIAGNOSIS — I70203 Unspecified atherosclerosis of native arteries of extremities, bilateral legs: Secondary | ICD-10-CM | POA: Diagnosis not present

## 2019-11-08 DIAGNOSIS — M79676 Pain in unspecified toe(s): Secondary | ICD-10-CM | POA: Diagnosis not present

## 2019-11-14 ENCOUNTER — Ambulatory Visit: Payer: Medicare Other | Admitting: Family Medicine

## 2019-11-14 ENCOUNTER — Encounter (HOSPITAL_COMMUNITY): Payer: Self-pay

## 2019-11-14 ENCOUNTER — Emergency Department (HOSPITAL_COMMUNITY)
Admission: EM | Admit: 2019-11-14 | Discharge: 2019-11-14 | Disposition: A | Discharge status: Home / Self Care | Payer: Medicare Other | Attending: Emergency Medicine | Admitting: Emergency Medicine

## 2019-11-14 ENCOUNTER — Other Ambulatory Visit: Payer: Self-pay

## 2019-11-14 ENCOUNTER — Emergency Department (HOSPITAL_COMMUNITY): Payer: Medicare Other

## 2019-11-14 DIAGNOSIS — R55 Syncope and collapse: Secondary | ICD-10-CM | POA: Insufficient documentation

## 2019-11-14 DIAGNOSIS — Z8673 Personal history of transient ischemic attack (TIA), and cerebral infarction without residual deficits: Secondary | ICD-10-CM | POA: Insufficient documentation

## 2019-11-14 DIAGNOSIS — R404 Transient alteration of awareness: Secondary | ICD-10-CM | POA: Diagnosis not present

## 2019-11-14 DIAGNOSIS — Z96653 Presence of artificial knee joint, bilateral: Secondary | ICD-10-CM | POA: Diagnosis not present

## 2019-11-14 DIAGNOSIS — E039 Hypothyroidism, unspecified: Secondary | ICD-10-CM | POA: Diagnosis not present

## 2019-11-14 DIAGNOSIS — F0390 Unspecified dementia without behavioral disturbance: Secondary | ICD-10-CM | POA: Diagnosis not present

## 2019-11-14 DIAGNOSIS — Z96612 Presence of left artificial shoulder joint: Secondary | ICD-10-CM | POA: Insufficient documentation

## 2019-11-14 DIAGNOSIS — R569 Unspecified convulsions: Secondary | ICD-10-CM | POA: Diagnosis not present

## 2019-11-14 DIAGNOSIS — I10 Essential (primary) hypertension: Secondary | ICD-10-CM | POA: Insufficient documentation

## 2019-11-14 DIAGNOSIS — R410 Disorientation, unspecified: Secondary | ICD-10-CM | POA: Diagnosis not present

## 2019-11-14 DIAGNOSIS — Z743 Need for continuous supervision: Secondary | ICD-10-CM | POA: Diagnosis not present

## 2019-11-14 DIAGNOSIS — E876 Hypokalemia: Secondary | ICD-10-CM | POA: Insufficient documentation

## 2019-11-14 DIAGNOSIS — R52 Pain, unspecified: Secondary | ICD-10-CM | POA: Diagnosis not present

## 2019-11-14 DIAGNOSIS — Z79899 Other long term (current) drug therapy: Secondary | ICD-10-CM | POA: Insufficient documentation

## 2019-11-14 LAB — BASIC METABOLIC PANEL
Anion gap: 10 (ref 5–15)
BUN: 12 mg/dL (ref 8–23)
CO2: 27 mmol/L (ref 22–32)
Calcium: 8.4 mg/dL — ABNORMAL LOW (ref 8.9–10.3)
Chloride: 99 mmol/L (ref 98–111)
Creatinine, Ser: 0.7 mg/dL (ref 0.44–1.00)
GFR, Estimated: >60 mL/min (ref >60)
Glucose, Bld: 123 mg/dL — ABNORMAL HIGH (ref 70–99)
Potassium: 3.1 mmol/L — ABNORMAL LOW (ref 3.5–5.1)
Sodium: 136 mmol/L (ref 135–145)

## 2019-11-14 LAB — URINALYSIS, ROUTINE W REFLEX MICROSCOPIC
Bilirubin Urine: NEGATIVE
Glucose, UA: NEGATIVE mg/dL
Ketones, ur: NEGATIVE mg/dL
Leukocytes,Ua: NEGATIVE
Nitrite: POSITIVE — AB
Protein, ur: NEGATIVE mg/dL
Specific Gravity, Urine: 1.01 (ref 1.005–1.030)
pH: 5 (ref 5.0–8.0)

## 2019-11-14 LAB — CBC WITH DIFFERENTIAL/PLATELET
Abs Immature Granulocytes: 0.03 10*3/uL (ref 0.00–0.07)
Basophils Absolute: 0.1 10*3/uL (ref 0.0–0.1)
Basophils Relative: 1 %
Eosinophils Absolute: 0.1 10*3/uL (ref 0.0–0.5)
Eosinophils Relative: 1 %
HCT: 39.5 % (ref 36.0–46.0)
Hemoglobin: 13.1 g/dL (ref 12.0–15.0)
Immature Granulocytes: 0 %
Lymphocytes Relative: 19 %
Lymphs Abs: 1.6 10*3/uL (ref 0.7–4.0)
MCH: 32.6 pg (ref 26.0–34.0)
MCHC: 33.2 g/dL (ref 30.0–36.0)
MCV: 98.3 fL (ref 80.0–100.0)
Monocytes Absolute: 0.9 10*3/uL (ref 0.1–1.0)
Monocytes Relative: 10 %
Neutro Abs: 5.8 10*3/uL (ref 1.7–7.7)
Neutrophils Relative %: 69 %
Platelets: 254 10*3/uL (ref 150–400)
RBC: 4.02 MIL/uL (ref 3.87–5.11)
RDW: 13.3 % (ref 11.5–15.5)
WBC: 8.4 10*3/uL (ref 4.0–10.5)
nRBC: 0 % (ref 0.0–0.2)

## 2019-11-14 LAB — DIGOXIN LEVEL: Digoxin Level: 0.4 ng/mL — ABNORMAL LOW (ref 1.0–2.0)

## 2019-11-14 LAB — CBG MONITORING, ED: Glucose-Capillary: 101 mg/dL — ABNORMAL HIGH (ref 70–99)

## 2019-11-14 LAB — PHENYTOIN LEVEL, TOTAL: Phenytoin Lvl: 4.6 ug/mL — ABNORMAL LOW (ref 10.0–20.0)

## 2019-11-14 MED ORDER — PHENYTOIN SODIUM EXTENDED 100 MG PO CAPS
100.0000 mg | ORAL_CAPSULE | Freq: Once | ORAL | Status: AC
  Administered 2019-11-14: 100 mg via ORAL
  Filled 2019-11-14: qty 1

## 2019-11-14 MED ORDER — SODIUM CHLORIDE 0.9 % IV BOLUS
500.0000 mL | Freq: Once | INTRAVENOUS | Status: AC
  Administered 2019-11-14: 500 mL via INTRAVENOUS

## 2019-11-14 MED ORDER — POTASSIUM CHLORIDE CRYS ER 20 MEQ PO TBCR: 20.0000 meq | EXTENDED_RELEASE_TABLET | Freq: Every day | ORAL | 0 refills | Status: DC

## 2019-11-14 MED ORDER — POTASSIUM CHLORIDE CRYS ER 20 MEQ PO TBCR
40.0000 meq | EXTENDED_RELEASE_TABLET | Freq: Once | ORAL | Status: AC
  Administered 2019-11-14: 40 meq via ORAL
  Filled 2019-11-14: qty 2

## 2019-11-14 NOTE — ED Notes (Signed)
Received care of patient resting on stretcher in hallway.  Patient repeatedly asking to go to the bathroom.  0 s/s acute distress. Call bell in reach.  Phlebotomist to bedside to attempt to obtain dilantin level.

## 2019-11-14 NOTE — ED Notes (Signed)
Attempted twice to obtain labs needed without success. Lab called and state they still do not have lab tach. Passed off in report.

## 2019-11-14 NOTE — ED Provider Notes (Signed)
[facility] EMERGENCY DEPARTMENT Provider Note   CSN: [id number] Arrival date & time: 11/14/19  1451  LEVEL 5 CAVEAT - DEMENTIA  History Chief Complaint  Patient presents with  . Near Syncope    Out to eat at with friends and found slumped over in bathroom on toilet. Patient alert when found. Able to answer questions. Patient states that she does not know what happened.     [name] is a 86 y.o. female.  HPI 86 year old female presents with possible syncope vs seizure.  History is provided primarily from the son over the phone.  Patient was out with her caregiver doing various errands and activities.  Over the past few weeks to a month the patient has been more tired doing these and the son wonders if she is doing too much.  She was in the bathroom and seemed to be in longer than typical so they went to check on her and she was unable to get off the toilet.  Seemed very weak.  It was unknown if she actually passed out.  The caregiver is wondering if she had a seizure though no seizure-like activity or syncope was witnessed.  By the time the ambulance arrived her strength seem to be better and she seems to be doing much better now.  The patient has dementia which limits the history but she was able to tell me she remembers feeling somewhat dizzy but is otherwise not a very good historian.   Past Medical History:  Diagnosis Date  . Anemia   . Atrial fibrillation ([place])   . Calf pain    September, 2012, at rest  . Carotid bruit    Doppler, December, 2009, no abnormality  . Cataract   . Diverticulosis   . GERD (gastroesophageal reflux disease)   . History of shingles   . HLD (hyperlipidemia)   . HTN (hypertension)   . Hypothyroidism   . Insomnia   . Osteoarthritis   . PONV (postoperative nausea and vomiting)   . Rectal bleeding 2001   diverticulosis and int hemorrhoids on 07/1999 and 02/2010 colonoscopies.  . Seizures ([place])   . Stroke ([place])   . Warfarin anticoagulation      Patient Active Problem List   Diagnosis Date Noted  . Seizure ([place]) 03/02/2018  . Left-sided weakness 03/01/2018  . Aphasia 01/30/2018  . History of Clostridioides difficile colitis 12/23/2017  . Mild dementia ([place]) 12/23/2017  . History of TIA (transient ischemic attack) 03/15/2017  . Osteoporosis 02/24/2017  . Prolonged QT interval 01/28/2017  . Unstable angina ([place]) 06/16/2016  . Atrial flutter ([place]) 06/16/2016  . Functional urinary incontinence 07/15/2015  . Hypo-osmolality and hyponatremia 07/15/2015  . Overactive bladder 05/02/2015  . History of CVA (cerebrovascular accident) 04/21/2015  . History of dysarthria 02/20/2015  . HTN (hypertension) 01/11/2015  . Hyponatremia 01/11/2015  . Chronic pain syndrome 11/25/2014  . Insomnia 11/25/2014  . Chronic diastolic CHF (congestive heart failure) ([place]) 11/12/2014  . Paroxysmal atrial fibrillation ([place]) 10/14/2014  . Dyslipidemia 09/10/2014  . Long term (current) use of anticoagulants 02/05/2014  . Hemorrhoids 07/16/2013  . Irritable bowel syndrome (IBS) 06/19/2013  . Hypothyroidism   . Carotid bruit   . S/P knee replacement   . DIVERTICULOSIS-COLON 01/19/2010    Past Surgical History:  Procedure Laterality Date  . BIOPSY  09/29/2015   Procedure: BIOPSY;  Surgeon: Rogene Houston, MD;  Location: [facility];  Service: Endoscopy;;  gastric  . CATARACT EXTRACTION W/PHACO  Right 04/15/2014   Procedure: CATARACT EXTRACTION PHACO AND INTRAOCULAR LENS PLACEMENT (IOC);  Surgeon: Tonny Branch, MD;  Location: [facility] ORS;  Service: Ophthalmology;  Laterality: Right;  CDE: 13.30  . CATARACT EXTRACTION W/PHACO Left 05/13/2014   Procedure: CATARACT EXTRACTION PHACO AND INTRAOCULAR LENS PLACEMENT LEFT EYE;  Surgeon: Tonny Branch, MD;  Location: [facility] ORS;  Service: Ophthalmology;  Laterality: Left;  CDE:13.00  . CHOLECYSTECTOMY N/A 01/15/2015   Procedure: LAPAROSCOPIC CHOLECYSTECTOMY;  Surgeon: Ralene Ok, MD;  Location: [place];  Service:  General;  Laterality: N/A;  . COLONOSCOPY N/A 09/29/2015   Procedure: COLONOSCOPY;  Surgeon: Rogene Houston, MD;  Location: [facility];  Service: Endoscopy;  Laterality: N/A;  . COLONOSCOPY    . ESOPHAGOGASTRODUODENOSCOPY N/A 01/13/2015   Procedure: ESOPHAGOGASTRODUODENOSCOPY (EGD);  Surgeon: Jerene Bears, MD;  Location[facility] ENDOSCOPY;  Service: Endoscopy;  Laterality: N/A;  . ESOPHAGOGASTRODUODENOSCOPY N/A 09/29/2015   Procedure: ESOPHAGOGASTRODUODENOSCOPY (EGD);  Surgeon: Rogene Houston, MD;  Location: [facility];  Service: Endoscopy;  Laterality: N/A;  12:15  . GALLBLADDER SURGERY    . LEFT HEART CATH AND CORONARY ANGIOGRAPHY N/A 06/22/2016   Procedure: Left Heart Cath and Coronary Angiography;  Surgeon: Troy Sine, MD;  Location: [place] [facility] LAB;  Service: Cardiovascular;  Laterality: N/A;  . SHOULDER OPEN ROTATOR CUFF REPAIR     bilateral  . TOTAL KNEE ARTHROPLASTY  05/26/10   right  . TOTAL KNEE ARTHROPLASTY  11/17/2010   Procedure: TOTAL KNEE ARTHROPLASTY;  Surgeon: Mauri Pole;  Location: [facility] ORS;  Service: Orthopedics;  Laterality: Left;  . TOTAL KNEE ARTHROPLASTY     Right  . TOTAL SHOULDER REPLACEMENT  01/2010   left     OB History    Gravida  1   Para  1   Term  1   Preterm      AB      Living        SAB      TAB      Ectopic      Multiple      Live Births              Family History  Problem Relation Age of Onset  . COPD Brother   . Atrial fibrillation Brother   . Diabetes Brother   . Liver cancer Brother        Alcohol-related  . Colon cancer Brother   . Heart attack Father   . Early death Father   . Heart disease Father   . Rheum arthritis Mother   . Arthritis Mother   . Heart disease Mother   . Coronary artery disease Brother   . Heart attack Daughter   . Gallbladder disease Brother     Social History   Tobacco Use  . Smoking status: Never Smoker  . Smokeless tobacco: Never Used  Vaping Use  . Vaping Use: Never  used  Substance Use Topics  . Alcohol use: No  . Drug use: No    Home Medications Prior to Admission medications   Medication Sig Start Date End Date Taking? Authorizing Provider  atorvastatin (LIPITOR) 20 MG tablet Take 1 tablet (20 mg total) by mouth every other day. 09/14/19  Yes Dettinger, Fransisca Kaufmann, MD  busPIRone (BUSPAR) 10 MG tablet Take 1 tablet (10 mg total) by mouth 2 (two) times daily. 10/24/19  Yes Dettinger, Fransisca Kaufmann, MD  digoxin (LANOXIN) 0.125 MG tablet Take 0.5 tablets (0.0625 mg total) by mouth  daily. 04/12/19  Yes Minus Breeding, MD  diltiazem (CARDIZEM CD) 240 MG 24 hr capsule TAKE (1) CAPSULE DAILY Patient taking differently: Take 240 mg by mouth daily.  06/07/19  Yes Minus Breeding, MD  donepezil (ARICEPT) 5 MG tablet TAKE ONE TABLET AT BEDTIME FOR MEMORY 08/08/19  Yes Cameron Sprang, MD  GNP MELATONIN MAXIMUM STRENGTH 5 MG TABS TAKE ONE TABLET AT BEDTIME 10/24/19  Yes Dettinger, Fransisca Kaufmann, MD  hydrALAZINE (APRESOLINE) 10 MG tablet TAKE 1 TABLET 2 TIMES A DAY 10/24/19  Yes Dettinger, Fransisca Kaufmann, MD  levothyroxine (SYNTHROID) 75 MCG tablet TAKE 1 TABLET ONCE DAILY 09/21/19  Yes Dettinger, Fransisca Kaufmann, MD  loperamide (IMODIUM) 2 MG capsule TAKE (1) CAPSULE FOUR TIMES DAILY AS NEEDED FOR DIARRHEA OR LOOSE STOOLS Patient taking differently: Take 2 mg by mouth 4 (four) times daily as needed.  04/11/19  Yes Dettinger, Fransisca Kaufmann, MD  metoprolol succinate (TOPROL-XL) 100 MG 24 hr tablet TAKE 1 TABLET EVERY DAY FOLLOWING A MEAL Patient taking differently: Take 100 mg by mouth daily.  08/30/19  Yes Dettinger, Fransisca Kaufmann, MD  Multiple Vitamin (MULTIVITAMIN WITH MINERALS) TABS tablet Take 1 tablet by mouth every morning.   Yes [provider]  phenytoin (DILANTIN) 100 MG ER capsule Take 1 capsule (100 mg total) by mouth 3 (three) times daily. 08/08/19  Yes Cameron Sprang, MD  SAVAYSA 30 MG TABS tablet TAKE 1 TABLET ONCE A DAY FOR AFIB Patient taking differently: Take 30 mg by mouth daily.   10/25/19  Yes Minus Breeding, MD  sertraline (ZOLOFT) 50 MG tablet Take 1 tablet (50 mg total) by mouth daily. 09/14/19  Yes Dettinger, Fransisca Kaufmann, MD  tamsulosin (FLOMAX) 0.4 MG CAPS capsule TAKE (1) CAPSULE DAILY Patient taking differently: Take 0.4 mg by mouth daily.  08/30/19  Yes Dettinger, Fransisca Kaufmann, MD  traZODone (DESYREL) 50 MG tablet TAKE 1/2 TO 1 TABLET AT BEDTIME AS NEEDED FOR SLEEP Patient taking differently: Take 50 mg by mouth at bedtime.  10/25/19  Yes Dettinger, Fransisca Kaufmann, MD  Acetaminophen (TYLENOL) 325 MG CAPS Take 650 mg by mouth every 6 (six) hours as needed for mild pain.    [provider]  ARTIFICIAL TEAR OP Apply 2 drops to eye daily as needed (Dryness). To the left eye    [provider]  calcium carbonate (OS-CAL) 1250 (500 Ca) MG chewable tablet Chew 1 tablet by mouth daily.    [provider]  clotrimazole-betamethasone (LOTRISONE) cream Apply topically 2 times daily to affected areas until rash clears Patient taking differently: Apply 1 application topically 2 (two) times daily.  01/22/19   Dettinger, Fransisca Kaufmann, MD  hydrocortisone (ANUSOL-HC) 25 MG suppository Place 1 suppository (25 mg total) rectally 2 (two) times daily. Patient not taking: Reported on 11/14/2019 08/16/19   Dettinger, Fransisca Kaufmann, MD  phenylephrine (HEMORRHOIDAL) 0.25 % suppository Place 1 suppository rectally 2 (two) times daily. Patient not taking: Reported on 11/14/2019 08/31/19   Dettinger, Fransisca Kaufmann, MD  potassium chloride SA (KLOR-CON) 20 MEQ tablet Take 1 tablet (20 mEq total) by mouth daily. 11/14/19   Sherwood Gambler, MD    Allergies    Penicillins  Review of Systems   Review of Systems  Unable to perform ROS: Dementia    Physical Exam Updated Vital Signs BP (!) 142/76   Pulse 72   Temp 98.2 F (36.8 C)   Resp 16   Ht 5\' 2"  (1.575 m)   Wt 59 kg  SpO2 98%   BMI 23.78 kg/m   Physical Exam Vitals and nursing note reviewed.  Constitutional:      General: She  is not in acute distress.    Appearance: She is well-developed. She is not ill-appearing or diaphoretic.  HENT:     Head: Normocephalic and atraumatic.     Right Ear: External ear normal.     Left Ear: External ear normal.     Nose: Nose normal.  Eyes:     General:        Right eye: No discharge.        Left eye: No discharge.     Extraocular Movements: Extraocular movements intact.  Cardiovascular:     Rate and Rhythm: Normal rate. Rhythm irregular.     Heart sounds: Normal heart sounds.  Pulmonary:     Effort: Pulmonary effort is normal.     Breath sounds: Normal breath sounds.  Abdominal:     Palpations: Abdomen is soft.     Tenderness: There is no abdominal tenderness.  Skin:    General: Skin is warm and dry.  Neurological:     Mental Status: She is alert. She is disoriented.     Comments: Alert and oriented to person, place and month. Disoriented to year.  CN 3-12 grossly intact. 5/5 strength in all 4 extremities. Grossly normal sensation. Normal finger to nose.   Psychiatric:        Mood and Affect: Mood is not anxious.     ED Results / Procedures / Treatments   Labs (all labs ordered are listed, but only abnormal results are displayed) Labs Reviewed  URINALYSIS, ROUTINE W REFLEX MICROSCOPIC - Abnormal; Notable for the following components:      Result Value   APPearance HAZY (*)    Hgb urine dipstick SMALL (*)    Nitrite POSITIVE (*)    Bacteria, UA RARE (*)    All other components within normal limits  BASIC METABOLIC PANEL - Abnormal; Notable for the following components:   Potassium 3.1 (*)    Glucose, Bld 123 (*)    Calcium 8.4 (*)    All other components within normal limits  DIGOXIN LEVEL - Abnormal; Notable for the following components:   Digoxin Level 0.4 (*)    All other components within normal limits  PHENYTOIN LEVEL, TOTAL - Abnormal; Notable for the following components:   Phenytoin Lvl 4.6 (*)    All other components within normal limits  CBG  MONITORING, ED - Abnormal; Notable for the following components:   Glucose-Capillary 101 (*)    All other components within normal limits  URINE CULTURE  CBC WITH DIFFERENTIAL/PLATELET    EKG EKG Interpretation  Date/Time:  Wednesday November 14 2019 15:02:00 EST Ventricular Rate:  66 PR Interval:    QRS Duration: 84 QT Interval:  396 QTC Calculation: 415 R Axis:   -61 Text Interpretation: Atrial fibrillation Left axis deviation Anteroseptal infarct , age undetermined Abnormal ECG Confirmed by Sherwood Gambler [phone]) on 11/14/2019 3:51:37 PM   Radiology CT HEAD WO CONTRAST  Result Date: 11/14/2019 CLINICAL DATA:  Seizure. EXAM: CT HEAD WITHOUT CONTRAST TECHNIQUE: Contiguous axial images were obtained from the base of the skull through the vertex without intravenous contrast. COMPARISON:  MRI of the brain March 03, 2018. FINDINGS: Brain: No evidence of acute infarction, hemorrhage, hydrocephalus, extra-axial collection or mass lesion/mass effect. Confluent hypodensity of the periventricular white matter, nonspecific, most likely related to chronic microangiopathic changes. Vascular: Calcified plaques  in the bilateral carotid siphons. No hyperdense vessel. Skull: Normal. Negative for fracture or focal lesion. Sinuses/Orbits: No acute finding. Other: Small left mastoid effusion IMPRESSION: 1. No acute intracranial pathology. 2. Small left mastoid effusion. Electronically Signed   By: Pedro Earls M.D.   On: 11/14/2019 18:47    Procedures Procedures (including critical care time)  Medications Ordered in ED Medications  sodium chloride 0.9 % bolus 500 mL (0 mLs Intravenous Stopped 11/14/19 1806)  potassium chloride SA (KLOR-CON) CR tablet 40 mEq (40 mEq Oral Given 11/14/19 1829)  sodium chloride 0.9 % bolus 500 mL (0 mLs Intravenous Stopped 11/14/19 2010)  phenytoin (DILANTIN) ER capsule 100 mg (100 mg Oral Given 11/14/19 2122)    ED Course  I have reviewed the  triage vital signs and the nursing notes.  Pertinent labs & imaging results that were available during my care of the patient were reviewed by me and considered in my medical decision making (see chart for details).    MDM Rules/Calculators/A&P                          I reviewed labs with son and he notes that she has been having some diarrhea recently.  I think likely she has been feeling weaker from extra activity and then diarrhea on top of it.  She was given IV fluids and oral potassium.  She has been walking to the bathroom without difficulty.  She does have a low Dilantin level but she also has not received her evening meds.  We will give her her Dilantin but overall I think this is primarily generalized weakness and lightheadedness from overexertion and some dehydration.  Discussed return precautions with son. Final Clinical Impression(s) / ED Diagnoses Final diagnoses:  Near syncope  Hypokalemia    Rx / DC Orders ED Discharge Orders         Ordered    potassium chloride SA (KLOR-CON) 20 MEQ tablet  Daily        11/14/19 2125           Sherwood Gambler, MD 11/14/19 2326

## 2019-11-14 NOTE — ED Notes (Signed)
Patient ambulated to bathroom with 1 person assist.

## 2019-11-15 ENCOUNTER — Telehealth: Payer: Self-pay

## 2019-11-15 NOTE — Telephone Encounter (Signed)
appt made for 11/19/19, son aware

## 2019-11-17 LAB — URINE CULTURE: Culture: >=100000 — AB

## 2019-11-18 ENCOUNTER — Telehealth: Payer: Self-pay | Admitting: *Deleted

## 2019-11-18 NOTE — Progress Notes (Signed)
ED Antimicrobial Stewardship Positive Culture Follow Up   [name] is an 86 y.o. female who presented to [facility] on 11/14/2019 with a chief complaint of  Chief Complaint  Patient presents with  . Near Syncope    Out to eat at with friends and found slumped over in bathroom on toilet. Patient alert when found. Able to answer questions. Patient states that she does not know what happened.     Recent Results (from the past 720 hour(s))  Urine culture     Status: Abnormal   Collection Time: 11/14/19  4:55 PM   Specimen: Urine, Random  Result Value Ref Range Status   Specimen Description   Final    URINE, RANDOM Performed at [facility], [address] [place], [place] [address]    Special Requests   Final    NONE Performed at [facility], [address], [place], [place] [address]    Culture >=100,000 COLONIES/mL ESCHERICHIA COLI (A)  Final   Report Status 11/17/2019 FINAL  Final   Organism ID, Bacteria ESCHERICHIA COLI (A)  Final      Susceptibility   Escherichia coli - MIC*    AMPICILLIN >=32 RESISTANT Resistant     CEFAZOLIN <=4 SENSITIVE Sensitive     CEFEPIME <=0.12 SENSITIVE Sensitive     CEFTRIAXONE <=0.25 SENSITIVE Sensitive     CIPROFLOXACIN <=0.25 SENSITIVE Sensitive     GENTAMICIN 4 SENSITIVE Sensitive     IMIPENEM <=0.25 SENSITIVE Sensitive     NITROFURANTOIN <=16 SENSITIVE Sensitive     TRIMETH/SULFA >=320 RESISTANT Resistant     AMPICILLIN/SULBACTAM 8 SENSITIVE Sensitive     PIP/TAZO <=4 SENSITIVE Sensitive     * >=100,000 COLONIES/mL ESCHERICHIA COLI    []  Treated with N/A, organism resistant to prescribed antimicrobial [x]  Patient discharged originally without antimicrobial agent and treatment is now indicated  New antibiotic prescription: Symptom check - if pt with UTI symptoms or still not feeling well, start cephalexin 500mg  PO BID x 5 days  ED Provider: Alfredia Client, PA-C   [name], Rande Lawman 11/18/2019, 10:16 AM Clinical  Pharmacist Monday - Friday phone -  [phone] Saturday - Sunday phone - [phone]

## 2019-11-18 NOTE — Telephone Encounter (Signed)
[name]  Culture report reviewed by antimicrobial stewardship pharmacist: [place] Team []  Elenor Quinones, Pharm.D. []  Heide Guile, Pharm.D., BCPS AQ-ID []  Parks Neptune, Pharm.D., BCPS []  Alycia Rossetti, Pharm.D., BCPS []  [place], Pharm.D., BCPS, AAHIVP []  Legrand Como, Pharm.D., BCPS, AAHIVP [x]  Salome Arnt, PharmD, BCPS []  Johnnette Gourd, PharmD, BCPS []  Hughes Better, PharmD, BCPS []  Leeroy Cha, PharmD []  Laqueta Linden, PharmD, BCPS []  Albertina Parr, PharmD  [place] Team []  Leodis Sias, PharmD []  Lindell Spar, PharmD []  Royetta Asal, PharmD []  Graylin Shiver, Rph []  Rema Fendt) Glennon Mac, PharmD []  Arlyn Dunning, PharmD []  [name], PharmD []  Dia Sitter, PharmD []  Leone Haven, PharmD []  Gretta Arab, PharmD []  Theodis Shove, PharmD []  Peggyann Juba, PharmD []  Reuel Boom, PharmD   Positive urine culture Patient is asymptomatic and no further patient follow-up is required at this time.   [name] [facility] 11/18/2019, 10:41 AM

## 2019-11-19 ENCOUNTER — Encounter: Payer: Self-pay | Admitting: Family Medicine

## 2019-11-19 ENCOUNTER — Ambulatory Visit (INDEPENDENT_AMBULATORY_CARE_PROVIDER_SITE_OTHER): Payer: Medicare Other | Admitting: Family Medicine

## 2019-11-19 ENCOUNTER — Other Ambulatory Visit: Payer: Self-pay

## 2019-11-19 VITALS — BP 133/81 | HR 95 | Temp 97.0°F | Ht 62.0 in | Wt 120.0 lb

## 2019-11-19 DIAGNOSIS — R296 Repeated falls: Secondary | ICD-10-CM

## 2019-11-19 DIAGNOSIS — E876 Hypokalemia: Secondary | ICD-10-CM

## 2019-11-19 DIAGNOSIS — E860 Dehydration: Secondary | ICD-10-CM

## 2019-11-19 DIAGNOSIS — Z780 Asymptomatic menopausal state: Secondary | ICD-10-CM

## 2019-11-19 DIAGNOSIS — F0390 Unspecified dementia without behavioral disturbance: Secondary | ICD-10-CM | POA: Diagnosis not present

## 2019-11-19 DIAGNOSIS — R197 Diarrhea, unspecified: Secondary | ICD-10-CM

## 2019-11-19 DIAGNOSIS — F03B0 Unspecified dementia, moderate, without behavioral disturbance, psychotic disturbance, mood disturbance, and anxiety: Secondary | ICD-10-CM

## 2019-11-19 NOTE — Progress Notes (Signed)
BP 133/81   Pulse 95   Temp (!) 97 F (36.1 C)   Ht 5' 2"  (1.575 m)   Wt 120 lb (54.4 kg)   SpO2 96%   BMI 21.95 kg/m    Subjective:   Patient ID: [name], female    DOB: 07/03/1932, 86 y.o.   MRN: [id number]  HPI: KAYSON BULLIS is a 86 y.o. female presenting on 11/19/2019 for Hospitalization Follow-up (low potassium/dehydration)   HPI Patient is coming in for hospital follow-up for low potassium and dehydration and possible syncope versus seizure. Patient has had ongoing dementia and recurrent diarrhea which has led to some of these issues. This is why she recurrently gets dehydrated and has issues with the potassium. This time patient went into the emergency department on 11/14/2019 and appears to been discharged the same day after receiving some fluids. Speaking with her caretaker and family member over the phone they have expressed concerns about the continued diarrhea leading to a lot of her issues. They were also concerned about possible seizures although it looks like in the ER they were not as concerned about that and more concerned about the dehydration and falls. Patient denies any major issues but her memory has significantly worsened that she does not remember many things. Patient has a history of CVA which is also affected her memory along with the dementia. She does use a walker or a cane most of the time but occasionally has been wandering more recently. She is incontinent to both bowel and bladder at times but not all times. They deny her having any fevers or chills currently. Patient does have generalized weakness with walking and that is why she has more likely falls.  Relevant past medical, surgical, family and social history reviewed and updated as indicated. Interim medical history since our last visit reviewed. Allergies and medications reviewed and updated.  Review of Systems  Constitutional: Negative for chills and fever.  HENT: Negative for congestion, ear  discharge and ear pain.   Eyes: Negative for redness and visual disturbance.  Respiratory: Negative for chest tightness and shortness of breath.   Cardiovascular: Negative for chest pain and leg swelling.  Genitourinary: Negative for difficulty urinating and dysuria.  Skin: Negative for rash.  Neurological: Positive for dizziness, seizures, syncope, weakness and light-headedness. Negative for headaches.  Psychiatric/Behavioral: Positive for confusion. Negative for agitation, behavioral problems and sleep disturbance.  All other systems reviewed and are negative.   Per HPI unless specifically indicated above   Allergies as of 11/19/2019      Reactions   Penicillins Rash, Other (See Comments)   Has patient had a PCN reaction causing immediate rash, facial/tongue/throat swelling, SOB or lightheadedness with hypotension: No Has patient had a PCN reaction causing severe rash involving mucus membranes or skin necrosis: No Has patient had a PCN reaction that required hospitalization No Has patient had a PCN reaction occurring within the last 10 years: No If all of the above answers are "NO", then may proceed with Cephalosporin use.      Medication List       Accurate as of November 19, 2019  4:20 PM. If you have any questions, ask your nurse or doctor.        ARTIFICIAL TEAR OP Apply 2 drops to eye daily as needed (Dryness). To the left eye   atorvastatin 20 MG tablet Commonly known as: LIPITOR Take 1 tablet (20 mg total) by mouth every other day.   busPIRone  10 MG tablet Commonly known as: BUSPAR Take 1 tablet (10 mg total) by mouth 2 (two) times daily.   calcium carbonate 1250 (500 Ca) MG chewable tablet Commonly known as: OS-CAL Chew 1 tablet by mouth daily.   clotrimazole-betamethasone cream Commonly known as: LOTRISONE Apply topically 2 times daily to affected areas until rash clears What changed: See the new instructions.   digoxin 0.125 MG tablet Commonly known as:  LANOXIN Take 0.5 tablets (0.0625 mg total) by mouth daily.   diltiazem 240 MG 24 hr capsule Commonly known as: CARDIZEM CD TAKE (1) CAPSULE DAILY What changed: See the new instructions.   donepezil 5 MG tablet Commonly known as: ARICEPT TAKE ONE TABLET AT BEDTIME FOR MEMORY   GNP Melatonin Maximum Strength 5 MG Tabs Generic drug: melatonin TAKE ONE TABLET AT BEDTIME   Hemorrhoidal 0.25 % suppository Generic drug: phenylephrine Place 1 suppository rectally 2 (two) times daily.   hydrALAZINE 10 MG tablet Commonly known as: APRESOLINE TAKE 1 TABLET 2 TIMES A DAY   hydrocortisone 25 MG suppository Commonly known as: ANUSOL-HC Place 1 suppository (25 mg total) rectally 2 (two) times daily.   levothyroxine 75 MCG tablet Commonly known as: SYNTHROID TAKE 1 TABLET ONCE DAILY   loperamide 2 MG capsule Commonly known as: IMODIUM TAKE (1) CAPSULE FOUR TIMES DAILY AS NEEDED FOR DIARRHEA OR LOOSE STOOLS What changed: See the new instructions.   metoprolol succinate 100 MG 24 hr tablet Commonly known as: TOPROL-XL TAKE 1 TABLET EVERY DAY FOLLOWING A MEAL What changed: See the new instructions.   multivitamin with minerals Tabs tablet Take 1 tablet by mouth every morning.   phenytoin 100 MG ER capsule Commonly known as: DILANTIN Take 1 capsule (100 mg total) by mouth 3 (three) times daily.   potassium chloride SA 20 MEQ tablet Commonly known as: KLOR-CON Take 1 tablet (20 mEq total) by mouth daily.   Savaysa 30 MG Tabs tablet Generic drug: edoxaban TAKE 1 TABLET ONCE A DAY FOR AFIB What changed: See the new instructions.   sertraline 50 MG tablet Commonly known as: Zoloft Take 1 tablet (50 mg total) by mouth daily.   tamsulosin 0.4 MG Caps capsule Commonly known as: FLOMAX TAKE (1) CAPSULE DAILY What changed: See the new instructions.   traZODone 50 MG tablet Commonly known as: DESYREL TAKE 1/2 TO 1 TABLET AT BEDTIME AS NEEDED FOR SLEEP What changed: See the new  instructions.   Tylenol 325 MG Caps Generic drug: Acetaminophen Take 650 mg by mouth every 6 (six) hours as needed for mild pain.        Objective:   BP 133/81   Pulse 95   Temp (!) 97 F (36.1 C)   Ht 5' 2"  (1.575 m)   Wt 120 lb (54.4 kg)   SpO2 96%   BMI 21.95 kg/m   Wt Readings from Last 3 Encounters:  11/19/19 120 lb (54.4 kg)  11/14/19 130 lb (59 kg)  10/24/19 121 lb (54.9 kg)    Physical Exam Vitals and nursing note reviewed.  Constitutional:      General: She is not in acute distress.    Appearance: She is well-developed. She is not diaphoretic.  Eyes:     Conjunctiva/sclera: Conjunctivae normal.  Cardiovascular:     Rate and Rhythm: Normal rate and regular rhythm.     Heart sounds: Normal heart sounds. No murmur heard.   Pulmonary:     Effort: Pulmonary effort is normal. No respiratory distress.  Breath sounds: Normal breath sounds. No wheezing.  Skin:    General: Skin is warm and dry.     Findings: No rash.  Neurological:     Mental Status: She is alert and oriented to person, place, and time.     Coordination: Coordination normal.     Gait: Gait abnormal (Uses walker).  Psychiatric:        Mood and Affect: Mood is not anxious or depressed.        Behavior: Behavior normal.        Cognition and Memory: Cognition is impaired. Memory is impaired.       Assessment & Plan:   Problem List Items Addressed This Visit      Nervous and Auditory   Mild dementia ([place])    Other Visit Diagnoses    Postmenopausal    -  Primary   Relevant Orders   DG [facility] DEXA   Hypokalemia       Relevant Orders   CBC with Differential/Platelet   CMP14+EGFR   Dehydration       Relevant Orders   CBC with Differential/Platelet   CMP14+EGFR   Recurrent falls       Diarrhea, unspecified type       Relevant Orders   Ambulatory referral to Gastroenterology    Placed FL 2 for patient because of dementia and recurrent issues, also please gastroenterology referral.  They are going to try to go to [place] and will do the gastroenterology referral to see if they can help any with the diarrhea. Patient is already using Imodium  Follow up plan: Return in about 3 months (around 02/19/2020), or if symptoms worsen or fail to improve, for Follow-up dementia and dehydration and diarrhea.  Counseling provided for all of the vaccine components Orders Placed This Encounter  Procedures  . DG [facility] DEXA  . CBC with Differential/Platelet  . CMP14+EGFR  . Ambulatory referral to Gastroenterology    Caryl Pina, MD [facility] 11/19/2019, 4:20 PM

## 2019-11-20 ENCOUNTER — Telehealth: Payer: Self-pay

## 2019-11-20 LAB — CMP14+EGFR
ALT: 16 IU/L (ref 0–32)
AST: 20 IU/L (ref 0–40)
Albumin/Globulin Ratio: 1.6 (ref 1.2–2.2)
Albumin: 4.1 g/dL (ref 3.6–4.6)
Alkaline Phosphatase: 93 IU/L (ref 44–121)
BUN/Creatinine Ratio: 13 (ref 12–28)
BUN: 9 mg/dL (ref 8–27)
Bilirubin Total: <0.2 mg/dL (ref 0.0–1.2)
CO2: 30 mmol/L — ABNORMAL HIGH (ref 20–29)
Calcium: 9.4 mg/dL (ref 8.7–10.3)
Chloride: 94 mmol/L — ABNORMAL LOW (ref 96–106)
Creatinine, Ser: 0.67 mg/dL (ref 0.57–1.00)
GFR calc Af Amer: 93 mL/min/{1.73_m2} (ref >59)
GFR calc non Af Amer: 80 mL/min/{1.73_m2} (ref >59)
Globulin, Total: 2.6 g/dL (ref 1.5–4.5)
Glucose: 116 mg/dL — ABNORMAL HIGH (ref 65–99)
Potassium: 4 mmol/L (ref 3.5–5.2)
Sodium: 135 mmol/L (ref 134–144)
Total Protein: 6.7 g/dL (ref 6.0–8.5)

## 2019-11-20 LAB — CBC WITH DIFFERENTIAL/PLATELET
Basophils Absolute: 0.1 10*3/uL (ref 0.0–0.2)
Basos: 1 %
EOS (ABSOLUTE): 0.1 10*3/uL (ref 0.0–0.4)
Eos: 2 %
Hematocrit: 39.9 % (ref 34.0–46.6)
Hemoglobin: 13.6 g/dL (ref 11.1–15.9)
Immature Grans (Abs): 0 10*3/uL (ref 0.0–0.1)
Immature Granulocytes: 0 %
Lymphocytes Absolute: 1.8 10*3/uL (ref 0.7–3.1)
Lymphs: 23 %
MCH: 33.2 pg — ABNORMAL HIGH (ref 26.6–33.0)
MCHC: 34.1 g/dL (ref 31.5–35.7)
MCV: 97 fL (ref 79–97)
Monocytes Absolute: 1 10*3/uL — ABNORMAL HIGH (ref 0.1–0.9)
Monocytes: 13 %
Neutrophils Absolute: 4.8 10*3/uL (ref 1.4–7.0)
Neutrophils: 61 %
Platelets: 305 10*3/uL (ref 150–450)
RBC: 4.1 x10E6/uL (ref 3.77–5.28)
RDW: 11.9 % (ref 11.7–15.4)
WBC: 7.8 10*3/uL (ref 3.4–10.8)

## 2019-11-20 NOTE — Telephone Encounter (Signed)
Son called back and says that he needs this done today. Dettinger was to sign the ppw yesterday during apt.

## 2019-11-20 NOTE — Telephone Encounter (Signed)
Aware FL2 sent to [facility] this morning. He would also like to be sent [facility] in [place]

## 2019-11-21 NOTE — Telephone Encounter (Signed)
LMOVM FL2s have have been faxed to [facility]

## 2019-11-22 ENCOUNTER — Other Ambulatory Visit: Payer: Self-pay | Admitting: Family Medicine

## 2019-11-22 DIAGNOSIS — R197 Diarrhea, unspecified: Secondary | ICD-10-CM

## 2019-11-22 NOTE — Progress Notes (Signed)
Son aware referral was going to be placed

## 2019-11-22 NOTE — Progress Notes (Unsigned)
Placed referral for Dr. Rosina Lowenstein office

## 2019-11-27 ENCOUNTER — Telehealth: Payer: Self-pay

## 2019-11-27 ENCOUNTER — Encounter (INDEPENDENT_AMBULATORY_CARE_PROVIDER_SITE_OTHER): Payer: Self-pay | Admitting: *Deleted

## 2019-11-27 NOTE — Telephone Encounter (Signed)
LMOVM original will be left at front desk, it has written on it where I faxed it to [facility] on 11/21/19, if they will not accept this. Then it will be tomorrow before I can get Dr. Warrick Parisian to sign another one since he is off today.

## 2019-11-29 DIAGNOSIS — E039 Hypothyroidism, unspecified: Secondary | ICD-10-CM | POA: Diagnosis not present

## 2019-11-29 DIAGNOSIS — M6281 Muscle weakness (generalized): Secondary | ICD-10-CM | POA: Diagnosis not present

## 2019-11-29 DIAGNOSIS — I5030 Unspecified diastolic (congestive) heart failure: Secondary | ICD-10-CM | POA: Diagnosis not present

## 2019-11-29 DIAGNOSIS — I6990 Unspecified sequelae of unspecified cerebrovascular disease: Secondary | ICD-10-CM | POA: Diagnosis not present

## 2019-11-29 DIAGNOSIS — I4891 Unspecified atrial fibrillation: Secondary | ICD-10-CM | POA: Diagnosis not present

## 2019-11-29 DIAGNOSIS — R278 Other lack of coordination: Secondary | ICD-10-CM | POA: Diagnosis not present

## 2019-11-29 DIAGNOSIS — M810 Age-related osteoporosis without current pathological fracture: Secondary | ICD-10-CM | POA: Diagnosis not present

## 2019-11-29 DIAGNOSIS — I110 Hypertensive heart disease with heart failure: Secondary | ICD-10-CM | POA: Diagnosis not present

## 2019-11-29 DIAGNOSIS — R531 Weakness: Secondary | ICD-10-CM | POA: Diagnosis not present

## 2019-11-30 DIAGNOSIS — M810 Age-related osteoporosis without current pathological fracture: Secondary | ICD-10-CM | POA: Diagnosis not present

## 2019-11-30 DIAGNOSIS — M6281 Muscle weakness (generalized): Secondary | ICD-10-CM | POA: Diagnosis not present

## 2019-11-30 DIAGNOSIS — R531 Weakness: Secondary | ICD-10-CM | POA: Diagnosis not present

## 2019-11-30 DIAGNOSIS — E559 Vitamin D deficiency, unspecified: Secondary | ICD-10-CM | POA: Diagnosis not present

## 2019-11-30 DIAGNOSIS — I6990 Unspecified sequelae of unspecified cerebrovascular disease: Secondary | ICD-10-CM | POA: Diagnosis not present

## 2019-11-30 DIAGNOSIS — I4891 Unspecified atrial fibrillation: Secondary | ICD-10-CM | POA: Diagnosis not present

## 2019-11-30 DIAGNOSIS — I5030 Unspecified diastolic (congestive) heart failure: Secondary | ICD-10-CM | POA: Diagnosis not present

## 2019-11-30 DIAGNOSIS — I10 Essential (primary) hypertension: Secondary | ICD-10-CM | POA: Diagnosis not present

## 2019-11-30 DIAGNOSIS — R278 Other lack of coordination: Secondary | ICD-10-CM | POA: Diagnosis not present

## 2019-11-30 DIAGNOSIS — R946 Abnormal results of thyroid function studies: Secondary | ICD-10-CM | POA: Diagnosis not present

## 2019-11-30 DIAGNOSIS — E039 Hypothyroidism, unspecified: Secondary | ICD-10-CM | POA: Diagnosis not present

## 2019-11-30 DIAGNOSIS — D649 Anemia, unspecified: Secondary | ICD-10-CM | POA: Diagnosis not present

## 2019-11-30 DIAGNOSIS — I110 Hypertensive heart disease with heart failure: Secondary | ICD-10-CM | POA: Diagnosis not present

## 2019-12-03 DIAGNOSIS — M6281 Muscle weakness (generalized): Secondary | ICD-10-CM | POA: Diagnosis not present

## 2019-12-03 DIAGNOSIS — M810 Age-related osteoporosis without current pathological fracture: Secondary | ICD-10-CM | POA: Diagnosis not present

## 2019-12-03 DIAGNOSIS — I5030 Unspecified diastolic (congestive) heart failure: Secondary | ICD-10-CM | POA: Diagnosis not present

## 2019-12-03 DIAGNOSIS — I4891 Unspecified atrial fibrillation: Secondary | ICD-10-CM | POA: Diagnosis not present

## 2019-12-03 DIAGNOSIS — I6990 Unspecified sequelae of unspecified cerebrovascular disease: Secondary | ICD-10-CM | POA: Diagnosis not present

## 2019-12-03 DIAGNOSIS — R531 Weakness: Secondary | ICD-10-CM | POA: Diagnosis not present

## 2019-12-03 DIAGNOSIS — R278 Other lack of coordination: Secondary | ICD-10-CM | POA: Diagnosis not present

## 2019-12-03 DIAGNOSIS — E039 Hypothyroidism, unspecified: Secondary | ICD-10-CM | POA: Diagnosis not present

## 2019-12-03 DIAGNOSIS — I110 Hypertensive heart disease with heart failure: Secondary | ICD-10-CM | POA: Diagnosis not present

## 2019-12-04 DIAGNOSIS — I5030 Unspecified diastolic (congestive) heart failure: Secondary | ICD-10-CM | POA: Diagnosis not present

## 2019-12-04 DIAGNOSIS — I6990 Unspecified sequelae of unspecified cerebrovascular disease: Secondary | ICD-10-CM | POA: Diagnosis not present

## 2019-12-04 DIAGNOSIS — M6281 Muscle weakness (generalized): Secondary | ICD-10-CM | POA: Diagnosis not present

## 2019-12-04 DIAGNOSIS — R278 Other lack of coordination: Secondary | ICD-10-CM | POA: Diagnosis not present

## 2019-12-04 DIAGNOSIS — E039 Hypothyroidism, unspecified: Secondary | ICD-10-CM | POA: Diagnosis not present

## 2019-12-04 DIAGNOSIS — R531 Weakness: Secondary | ICD-10-CM | POA: Diagnosis not present

## 2019-12-04 DIAGNOSIS — M810 Age-related osteoporosis without current pathological fracture: Secondary | ICD-10-CM | POA: Diagnosis not present

## 2019-12-04 DIAGNOSIS — I4891 Unspecified atrial fibrillation: Secondary | ICD-10-CM | POA: Diagnosis not present

## 2019-12-04 DIAGNOSIS — I110 Hypertensive heart disease with heart failure: Secondary | ICD-10-CM | POA: Diagnosis not present

## 2019-12-05 DIAGNOSIS — I5030 Unspecified diastolic (congestive) heart failure: Secondary | ICD-10-CM | POA: Diagnosis not present

## 2019-12-05 DIAGNOSIS — I6990 Unspecified sequelae of unspecified cerebrovascular disease: Secondary | ICD-10-CM | POA: Diagnosis not present

## 2019-12-05 DIAGNOSIS — R278 Other lack of coordination: Secondary | ICD-10-CM | POA: Diagnosis not present

## 2019-12-05 DIAGNOSIS — M810 Age-related osteoporosis without current pathological fracture: Secondary | ICD-10-CM | POA: Diagnosis not present

## 2019-12-05 DIAGNOSIS — I110 Hypertensive heart disease with heart failure: Secondary | ICD-10-CM | POA: Diagnosis not present

## 2019-12-05 DIAGNOSIS — I4891 Unspecified atrial fibrillation: Secondary | ICD-10-CM | POA: Diagnosis not present

## 2019-12-05 DIAGNOSIS — R531 Weakness: Secondary | ICD-10-CM | POA: Diagnosis not present

## 2019-12-05 DIAGNOSIS — M6281 Muscle weakness (generalized): Secondary | ICD-10-CM | POA: Diagnosis not present

## 2019-12-06 DIAGNOSIS — I4891 Unspecified atrial fibrillation: Secondary | ICD-10-CM | POA: Diagnosis not present

## 2019-12-06 DIAGNOSIS — R278 Other lack of coordination: Secondary | ICD-10-CM | POA: Diagnosis not present

## 2019-12-06 DIAGNOSIS — M6281 Muscle weakness (generalized): Secondary | ICD-10-CM | POA: Diagnosis not present

## 2019-12-06 DIAGNOSIS — R531 Weakness: Secondary | ICD-10-CM | POA: Diagnosis not present

## 2019-12-06 DIAGNOSIS — M810 Age-related osteoporosis without current pathological fracture: Secondary | ICD-10-CM | POA: Diagnosis not present

## 2019-12-06 DIAGNOSIS — I110 Hypertensive heart disease with heart failure: Secondary | ICD-10-CM | POA: Diagnosis not present

## 2019-12-06 DIAGNOSIS — I6990 Unspecified sequelae of unspecified cerebrovascular disease: Secondary | ICD-10-CM | POA: Diagnosis not present

## 2019-12-06 DIAGNOSIS — I5030 Unspecified diastolic (congestive) heart failure: Secondary | ICD-10-CM | POA: Diagnosis not present

## 2019-12-07 DIAGNOSIS — I6990 Unspecified sequelae of unspecified cerebrovascular disease: Secondary | ICD-10-CM | POA: Diagnosis not present

## 2019-12-07 DIAGNOSIS — I110 Hypertensive heart disease with heart failure: Secondary | ICD-10-CM | POA: Diagnosis not present

## 2019-12-07 DIAGNOSIS — I5030 Unspecified diastolic (congestive) heart failure: Secondary | ICD-10-CM | POA: Diagnosis not present

## 2019-12-07 DIAGNOSIS — M6281 Muscle weakness (generalized): Secondary | ICD-10-CM | POA: Diagnosis not present

## 2019-12-07 DIAGNOSIS — I4891 Unspecified atrial fibrillation: Secondary | ICD-10-CM | POA: Diagnosis not present

## 2019-12-07 DIAGNOSIS — M810 Age-related osteoporosis without current pathological fracture: Secondary | ICD-10-CM | POA: Diagnosis not present

## 2019-12-07 DIAGNOSIS — R531 Weakness: Secondary | ICD-10-CM | POA: Diagnosis not present

## 2019-12-07 DIAGNOSIS — I10 Essential (primary) hypertension: Secondary | ICD-10-CM | POA: Diagnosis not present

## 2019-12-07 DIAGNOSIS — G9340 Encephalopathy, unspecified: Secondary | ICD-10-CM | POA: Diagnosis not present

## 2019-12-07 DIAGNOSIS — G309 Alzheimer's disease, unspecified: Secondary | ICD-10-CM | POA: Diagnosis not present

## 2019-12-07 DIAGNOSIS — R278 Other lack of coordination: Secondary | ICD-10-CM | POA: Diagnosis not present

## 2019-12-08 DIAGNOSIS — I110 Hypertensive heart disease with heart failure: Secondary | ICD-10-CM | POA: Diagnosis not present

## 2019-12-08 DIAGNOSIS — I6990 Unspecified sequelae of unspecified cerebrovascular disease: Secondary | ICD-10-CM | POA: Diagnosis not present

## 2019-12-08 DIAGNOSIS — R278 Other lack of coordination: Secondary | ICD-10-CM | POA: Diagnosis not present

## 2019-12-08 DIAGNOSIS — I5030 Unspecified diastolic (congestive) heart failure: Secondary | ICD-10-CM | POA: Diagnosis not present

## 2019-12-08 DIAGNOSIS — I4891 Unspecified atrial fibrillation: Secondary | ICD-10-CM | POA: Diagnosis not present

## 2019-12-08 DIAGNOSIS — M6281 Muscle weakness (generalized): Secondary | ICD-10-CM | POA: Diagnosis not present

## 2019-12-08 DIAGNOSIS — R531 Weakness: Secondary | ICD-10-CM | POA: Diagnosis not present

## 2019-12-08 DIAGNOSIS — M810 Age-related osteoporosis without current pathological fracture: Secondary | ICD-10-CM | POA: Diagnosis not present

## 2019-12-10 DIAGNOSIS — M810 Age-related osteoporosis without current pathological fracture: Secondary | ICD-10-CM | POA: Diagnosis not present

## 2019-12-10 DIAGNOSIS — R278 Other lack of coordination: Secondary | ICD-10-CM | POA: Diagnosis not present

## 2019-12-10 DIAGNOSIS — R531 Weakness: Secondary | ICD-10-CM | POA: Diagnosis not present

## 2019-12-10 DIAGNOSIS — I6990 Unspecified sequelae of unspecified cerebrovascular disease: Secondary | ICD-10-CM | POA: Diagnosis not present

## 2019-12-10 DIAGNOSIS — M6281 Muscle weakness (generalized): Secondary | ICD-10-CM | POA: Diagnosis not present

## 2019-12-10 DIAGNOSIS — I4891 Unspecified atrial fibrillation: Secondary | ICD-10-CM | POA: Diagnosis not present

## 2019-12-10 DIAGNOSIS — I110 Hypertensive heart disease with heart failure: Secondary | ICD-10-CM | POA: Diagnosis not present

## 2019-12-10 DIAGNOSIS — I5030 Unspecified diastolic (congestive) heart failure: Secondary | ICD-10-CM | POA: Diagnosis not present

## 2019-12-11 DIAGNOSIS — I110 Hypertensive heart disease with heart failure: Secondary | ICD-10-CM | POA: Diagnosis not present

## 2019-12-11 DIAGNOSIS — I5030 Unspecified diastolic (congestive) heart failure: Secondary | ICD-10-CM | POA: Diagnosis not present

## 2019-12-11 DIAGNOSIS — M810 Age-related osteoporosis without current pathological fracture: Secondary | ICD-10-CM | POA: Diagnosis not present

## 2019-12-11 DIAGNOSIS — R278 Other lack of coordination: Secondary | ICD-10-CM | POA: Diagnosis not present

## 2019-12-11 DIAGNOSIS — R531 Weakness: Secondary | ICD-10-CM | POA: Diagnosis not present

## 2019-12-11 DIAGNOSIS — I4891 Unspecified atrial fibrillation: Secondary | ICD-10-CM | POA: Diagnosis not present

## 2019-12-11 DIAGNOSIS — I6990 Unspecified sequelae of unspecified cerebrovascular disease: Secondary | ICD-10-CM | POA: Diagnosis not present

## 2019-12-11 DIAGNOSIS — M6281 Muscle weakness (generalized): Secondary | ICD-10-CM | POA: Diagnosis not present

## 2019-12-11 DIAGNOSIS — Z20822 Contact with and (suspected) exposure to covid-19: Secondary | ICD-10-CM | POA: Diagnosis not present

## 2019-12-12 DIAGNOSIS — M6281 Muscle weakness (generalized): Secondary | ICD-10-CM | POA: Diagnosis not present

## 2019-12-12 DIAGNOSIS — I4891 Unspecified atrial fibrillation: Secondary | ICD-10-CM | POA: Diagnosis not present

## 2019-12-12 DIAGNOSIS — I5030 Unspecified diastolic (congestive) heart failure: Secondary | ICD-10-CM | POA: Diagnosis not present

## 2019-12-12 DIAGNOSIS — R531 Weakness: Secondary | ICD-10-CM | POA: Diagnosis not present

## 2019-12-12 DIAGNOSIS — I110 Hypertensive heart disease with heart failure: Secondary | ICD-10-CM | POA: Diagnosis not present

## 2019-12-12 DIAGNOSIS — R278 Other lack of coordination: Secondary | ICD-10-CM | POA: Diagnosis not present

## 2019-12-12 DIAGNOSIS — M810 Age-related osteoporosis without current pathological fracture: Secondary | ICD-10-CM | POA: Diagnosis not present

## 2019-12-12 DIAGNOSIS — I6990 Unspecified sequelae of unspecified cerebrovascular disease: Secondary | ICD-10-CM | POA: Diagnosis not present

## 2019-12-13 DIAGNOSIS — I5030 Unspecified diastolic (congestive) heart failure: Secondary | ICD-10-CM | POA: Diagnosis not present

## 2019-12-13 DIAGNOSIS — M810 Age-related osteoporosis without current pathological fracture: Secondary | ICD-10-CM | POA: Diagnosis not present

## 2019-12-13 DIAGNOSIS — I110 Hypertensive heart disease with heart failure: Secondary | ICD-10-CM | POA: Diagnosis not present

## 2019-12-13 DIAGNOSIS — Z20822 Contact with and (suspected) exposure to covid-19: Secondary | ICD-10-CM | POA: Diagnosis not present

## 2019-12-13 DIAGNOSIS — R278 Other lack of coordination: Secondary | ICD-10-CM | POA: Diagnosis not present

## 2019-12-13 DIAGNOSIS — M6281 Muscle weakness (generalized): Secondary | ICD-10-CM | POA: Diagnosis not present

## 2019-12-13 DIAGNOSIS — I6990 Unspecified sequelae of unspecified cerebrovascular disease: Secondary | ICD-10-CM | POA: Diagnosis not present

## 2019-12-13 DIAGNOSIS — R531 Weakness: Secondary | ICD-10-CM | POA: Diagnosis not present

## 2019-12-13 DIAGNOSIS — I4891 Unspecified atrial fibrillation: Secondary | ICD-10-CM | POA: Diagnosis not present

## 2019-12-14 DIAGNOSIS — I6990 Unspecified sequelae of unspecified cerebrovascular disease: Secondary | ICD-10-CM | POA: Diagnosis not present

## 2019-12-14 DIAGNOSIS — R278 Other lack of coordination: Secondary | ICD-10-CM | POA: Diagnosis not present

## 2019-12-14 DIAGNOSIS — I4891 Unspecified atrial fibrillation: Secondary | ICD-10-CM | POA: Diagnosis not present

## 2019-12-14 DIAGNOSIS — R531 Weakness: Secondary | ICD-10-CM | POA: Diagnosis not present

## 2019-12-14 DIAGNOSIS — M6281 Muscle weakness (generalized): Secondary | ICD-10-CM | POA: Diagnosis not present

## 2019-12-14 DIAGNOSIS — I110 Hypertensive heart disease with heart failure: Secondary | ICD-10-CM | POA: Diagnosis not present

## 2019-12-14 DIAGNOSIS — I5030 Unspecified diastolic (congestive) heart failure: Secondary | ICD-10-CM | POA: Diagnosis not present

## 2019-12-14 DIAGNOSIS — M810 Age-related osteoporosis without current pathological fracture: Secondary | ICD-10-CM | POA: Diagnosis not present

## 2019-12-17 DIAGNOSIS — I4891 Unspecified atrial fibrillation: Secondary | ICD-10-CM | POA: Diagnosis not present

## 2019-12-17 DIAGNOSIS — I5030 Unspecified diastolic (congestive) heart failure: Secondary | ICD-10-CM | POA: Diagnosis not present

## 2019-12-17 DIAGNOSIS — M6281 Muscle weakness (generalized): Secondary | ICD-10-CM | POA: Diagnosis not present

## 2019-12-17 DIAGNOSIS — R278 Other lack of coordination: Secondary | ICD-10-CM | POA: Diagnosis not present

## 2019-12-17 DIAGNOSIS — M810 Age-related osteoporosis without current pathological fracture: Secondary | ICD-10-CM | POA: Diagnosis not present

## 2019-12-17 DIAGNOSIS — R531 Weakness: Secondary | ICD-10-CM | POA: Diagnosis not present

## 2019-12-17 DIAGNOSIS — I110 Hypertensive heart disease with heart failure: Secondary | ICD-10-CM | POA: Diagnosis not present

## 2019-12-17 DIAGNOSIS — I6990 Unspecified sequelae of unspecified cerebrovascular disease: Secondary | ICD-10-CM | POA: Diagnosis not present

## 2019-12-18 DIAGNOSIS — R531 Weakness: Secondary | ICD-10-CM | POA: Diagnosis not present

## 2019-12-18 DIAGNOSIS — I110 Hypertensive heart disease with heart failure: Secondary | ICD-10-CM | POA: Diagnosis not present

## 2019-12-18 DIAGNOSIS — M810 Age-related osteoporosis without current pathological fracture: Secondary | ICD-10-CM | POA: Diagnosis not present

## 2019-12-18 DIAGNOSIS — I5030 Unspecified diastolic (congestive) heart failure: Secondary | ICD-10-CM | POA: Diagnosis not present

## 2019-12-18 DIAGNOSIS — I4891 Unspecified atrial fibrillation: Secondary | ICD-10-CM | POA: Diagnosis not present

## 2019-12-18 DIAGNOSIS — I6990 Unspecified sequelae of unspecified cerebrovascular disease: Secondary | ICD-10-CM | POA: Diagnosis not present

## 2019-12-18 DIAGNOSIS — R278 Other lack of coordination: Secondary | ICD-10-CM | POA: Diagnosis not present

## 2019-12-18 DIAGNOSIS — M6281 Muscle weakness (generalized): Secondary | ICD-10-CM | POA: Diagnosis not present

## 2019-12-19 DIAGNOSIS — I4891 Unspecified atrial fibrillation: Secondary | ICD-10-CM | POA: Diagnosis not present

## 2019-12-19 DIAGNOSIS — I6990 Unspecified sequelae of unspecified cerebrovascular disease: Secondary | ICD-10-CM | POA: Diagnosis not present

## 2019-12-19 DIAGNOSIS — I110 Hypertensive heart disease with heart failure: Secondary | ICD-10-CM | POA: Diagnosis not present

## 2019-12-19 DIAGNOSIS — R278 Other lack of coordination: Secondary | ICD-10-CM | POA: Diagnosis not present

## 2019-12-19 DIAGNOSIS — M810 Age-related osteoporosis without current pathological fracture: Secondary | ICD-10-CM | POA: Diagnosis not present

## 2019-12-19 DIAGNOSIS — R531 Weakness: Secondary | ICD-10-CM | POA: Diagnosis not present

## 2019-12-19 DIAGNOSIS — I5030 Unspecified diastolic (congestive) heart failure: Secondary | ICD-10-CM | POA: Diagnosis not present

## 2019-12-19 DIAGNOSIS — M6281 Muscle weakness (generalized): Secondary | ICD-10-CM | POA: Diagnosis not present

## 2019-12-20 DIAGNOSIS — I4891 Unspecified atrial fibrillation: Secondary | ICD-10-CM | POA: Diagnosis not present

## 2019-12-20 DIAGNOSIS — M6281 Muscle weakness (generalized): Secondary | ICD-10-CM | POA: Diagnosis not present

## 2019-12-20 DIAGNOSIS — M810 Age-related osteoporosis without current pathological fracture: Secondary | ICD-10-CM | POA: Diagnosis not present

## 2019-12-20 DIAGNOSIS — R531 Weakness: Secondary | ICD-10-CM | POA: Diagnosis not present

## 2019-12-20 DIAGNOSIS — I6990 Unspecified sequelae of unspecified cerebrovascular disease: Secondary | ICD-10-CM | POA: Diagnosis not present

## 2019-12-20 DIAGNOSIS — R278 Other lack of coordination: Secondary | ICD-10-CM | POA: Diagnosis not present

## 2019-12-20 DIAGNOSIS — I5030 Unspecified diastolic (congestive) heart failure: Secondary | ICD-10-CM | POA: Diagnosis not present

## 2019-12-20 DIAGNOSIS — I110 Hypertensive heart disease with heart failure: Secondary | ICD-10-CM | POA: Diagnosis not present

## 2019-12-21 DIAGNOSIS — M810 Age-related osteoporosis without current pathological fracture: Secondary | ICD-10-CM | POA: Diagnosis not present

## 2019-12-21 DIAGNOSIS — I110 Hypertensive heart disease with heart failure: Secondary | ICD-10-CM | POA: Diagnosis not present

## 2019-12-21 DIAGNOSIS — I6990 Unspecified sequelae of unspecified cerebrovascular disease: Secondary | ICD-10-CM | POA: Diagnosis not present

## 2019-12-21 DIAGNOSIS — M6281 Muscle weakness (generalized): Secondary | ICD-10-CM | POA: Diagnosis not present

## 2019-12-21 DIAGNOSIS — I5030 Unspecified diastolic (congestive) heart failure: Secondary | ICD-10-CM | POA: Diagnosis not present

## 2019-12-21 DIAGNOSIS — R278 Other lack of coordination: Secondary | ICD-10-CM | POA: Diagnosis not present

## 2019-12-21 DIAGNOSIS — R531 Weakness: Secondary | ICD-10-CM | POA: Diagnosis not present

## 2019-12-21 DIAGNOSIS — I4891 Unspecified atrial fibrillation: Secondary | ICD-10-CM | POA: Diagnosis not present

## 2019-12-24 DIAGNOSIS — I6990 Unspecified sequelae of unspecified cerebrovascular disease: Secondary | ICD-10-CM | POA: Diagnosis not present

## 2019-12-24 DIAGNOSIS — M810 Age-related osteoporosis without current pathological fracture: Secondary | ICD-10-CM | POA: Diagnosis not present

## 2019-12-24 DIAGNOSIS — I4891 Unspecified atrial fibrillation: Secondary | ICD-10-CM | POA: Diagnosis not present

## 2019-12-24 DIAGNOSIS — R531 Weakness: Secondary | ICD-10-CM | POA: Diagnosis not present

## 2019-12-24 DIAGNOSIS — M6281 Muscle weakness (generalized): Secondary | ICD-10-CM | POA: Diagnosis not present

## 2019-12-24 DIAGNOSIS — I5030 Unspecified diastolic (congestive) heart failure: Secondary | ICD-10-CM | POA: Diagnosis not present

## 2019-12-24 DIAGNOSIS — I110 Hypertensive heart disease with heart failure: Secondary | ICD-10-CM | POA: Diagnosis not present

## 2019-12-24 DIAGNOSIS — R278 Other lack of coordination: Secondary | ICD-10-CM | POA: Diagnosis not present

## 2019-12-25 DIAGNOSIS — M810 Age-related osteoporosis without current pathological fracture: Secondary | ICD-10-CM | POA: Diagnosis not present

## 2019-12-25 DIAGNOSIS — I4891 Unspecified atrial fibrillation: Secondary | ICD-10-CM | POA: Diagnosis not present

## 2019-12-25 DIAGNOSIS — R278 Other lack of coordination: Secondary | ICD-10-CM | POA: Diagnosis not present

## 2019-12-25 DIAGNOSIS — I5030 Unspecified diastolic (congestive) heart failure: Secondary | ICD-10-CM | POA: Diagnosis not present

## 2019-12-25 DIAGNOSIS — I6990 Unspecified sequelae of unspecified cerebrovascular disease: Secondary | ICD-10-CM | POA: Diagnosis not present

## 2019-12-25 DIAGNOSIS — M6281 Muscle weakness (generalized): Secondary | ICD-10-CM | POA: Diagnosis not present

## 2019-12-25 DIAGNOSIS — R531 Weakness: Secondary | ICD-10-CM | POA: Diagnosis not present

## 2019-12-25 DIAGNOSIS — I110 Hypertensive heart disease with heart failure: Secondary | ICD-10-CM | POA: Diagnosis not present

## 2019-12-26 DIAGNOSIS — R531 Weakness: Secondary | ICD-10-CM | POA: Diagnosis not present

## 2019-12-26 DIAGNOSIS — I110 Hypertensive heart disease with heart failure: Secondary | ICD-10-CM | POA: Diagnosis not present

## 2019-12-26 DIAGNOSIS — I6990 Unspecified sequelae of unspecified cerebrovascular disease: Secondary | ICD-10-CM | POA: Diagnosis not present

## 2019-12-26 DIAGNOSIS — I5030 Unspecified diastolic (congestive) heart failure: Secondary | ICD-10-CM | POA: Diagnosis not present

## 2019-12-26 DIAGNOSIS — M810 Age-related osteoporosis without current pathological fracture: Secondary | ICD-10-CM | POA: Diagnosis not present

## 2019-12-26 DIAGNOSIS — I4891 Unspecified atrial fibrillation: Secondary | ICD-10-CM | POA: Diagnosis not present

## 2019-12-26 DIAGNOSIS — M6281 Muscle weakness (generalized): Secondary | ICD-10-CM | POA: Diagnosis not present

## 2019-12-26 DIAGNOSIS — R278 Other lack of coordination: Secondary | ICD-10-CM | POA: Diagnosis not present

## 2019-12-27 DIAGNOSIS — R278 Other lack of coordination: Secondary | ICD-10-CM | POA: Diagnosis not present

## 2019-12-27 DIAGNOSIS — M810 Age-related osteoporosis without current pathological fracture: Secondary | ICD-10-CM | POA: Diagnosis not present

## 2019-12-27 DIAGNOSIS — I4891 Unspecified atrial fibrillation: Secondary | ICD-10-CM | POA: Diagnosis not present

## 2019-12-27 DIAGNOSIS — R531 Weakness: Secondary | ICD-10-CM | POA: Diagnosis not present

## 2019-12-27 DIAGNOSIS — M6281 Muscle weakness (generalized): Secondary | ICD-10-CM | POA: Diagnosis not present

## 2019-12-27 DIAGNOSIS — I110 Hypertensive heart disease with heart failure: Secondary | ICD-10-CM | POA: Diagnosis not present

## 2019-12-27 DIAGNOSIS — I6990 Unspecified sequelae of unspecified cerebrovascular disease: Secondary | ICD-10-CM | POA: Diagnosis not present

## 2019-12-27 DIAGNOSIS — I5030 Unspecified diastolic (congestive) heart failure: Secondary | ICD-10-CM | POA: Diagnosis not present

## 2020-01-07 ENCOUNTER — Ambulatory Visit: Payer: Medicare Other | Admitting: Family Medicine

## 2020-01-18 DIAGNOSIS — I10 Essential (primary) hypertension: Secondary | ICD-10-CM | POA: Diagnosis not present

## 2020-01-18 DIAGNOSIS — I4891 Unspecified atrial fibrillation: Secondary | ICD-10-CM | POA: Diagnosis not present

## 2020-01-18 DIAGNOSIS — G9340 Encephalopathy, unspecified: Secondary | ICD-10-CM | POA: Diagnosis not present

## 2020-01-18 DIAGNOSIS — G309 Alzheimer's disease, unspecified: Secondary | ICD-10-CM | POA: Diagnosis not present

## 2020-01-18 DIAGNOSIS — I5030 Unspecified diastolic (congestive) heart failure: Secondary | ICD-10-CM | POA: Diagnosis not present

## 2020-01-18 DIAGNOSIS — E039 Hypothyroidism, unspecified: Secondary | ICD-10-CM | POA: Diagnosis not present

## 2020-01-29 DIAGNOSIS — I4891 Unspecified atrial fibrillation: Secondary | ICD-10-CM | POA: Diagnosis not present

## 2020-01-29 DIAGNOSIS — Z5181 Encounter for therapeutic drug level monitoring: Secondary | ICD-10-CM | POA: Diagnosis not present

## 2020-01-31 DIAGNOSIS — I6389 Other cerebral infarction: Secondary | ICD-10-CM | POA: Diagnosis not present

## 2020-01-31 DIAGNOSIS — I4819 Other persistent atrial fibrillation: Secondary | ICD-10-CM | POA: Diagnosis not present

## 2020-01-31 DIAGNOSIS — E878 Other disorders of electrolyte and fluid balance, not elsewhere classified: Secondary | ICD-10-CM | POA: Diagnosis not present

## 2020-01-31 DIAGNOSIS — I5030 Unspecified diastolic (congestive) heart failure: Secondary | ICD-10-CM | POA: Diagnosis not present

## 2020-01-31 DIAGNOSIS — E7849 Other hyperlipidemia: Secondary | ICD-10-CM | POA: Diagnosis not present

## 2020-01-31 DIAGNOSIS — F32A Depression, unspecified: Secondary | ICD-10-CM | POA: Diagnosis not present

## 2020-01-31 DIAGNOSIS — N390 Urinary tract infection, site not specified: Secondary | ICD-10-CM | POA: Diagnosis not present

## 2020-01-31 DIAGNOSIS — I10 Essential (primary) hypertension: Secondary | ICD-10-CM | POA: Diagnosis not present

## 2020-01-31 DIAGNOSIS — G9341 Metabolic encephalopathy: Secondary | ICD-10-CM | POA: Diagnosis not present

## 2020-01-31 DIAGNOSIS — E038 Other specified hypothyroidism: Secondary | ICD-10-CM | POA: Diagnosis not present

## 2020-02-01 DIAGNOSIS — R946 Abnormal results of thyroid function studies: Secondary | ICD-10-CM | POA: Diagnosis not present

## 2020-02-03 DIAGNOSIS — M545 Low back pain, unspecified: Secondary | ICD-10-CM | POA: Diagnosis not present

## 2020-02-07 DIAGNOSIS — M6281 Muscle weakness (generalized): Secondary | ICD-10-CM | POA: Diagnosis not present

## 2020-02-07 DIAGNOSIS — I4891 Unspecified atrial fibrillation: Secondary | ICD-10-CM | POA: Diagnosis not present

## 2020-02-07 DIAGNOSIS — R278 Other lack of coordination: Secondary | ICD-10-CM | POA: Diagnosis not present

## 2020-02-07 DIAGNOSIS — I5030 Unspecified diastolic (congestive) heart failure: Secondary | ICD-10-CM | POA: Diagnosis not present

## 2020-02-07 DIAGNOSIS — I110 Hypertensive heart disease with heart failure: Secondary | ICD-10-CM | POA: Diagnosis not present

## 2020-02-07 DIAGNOSIS — I6990 Unspecified sequelae of unspecified cerebrovascular disease: Secondary | ICD-10-CM | POA: Diagnosis not present

## 2020-02-07 DIAGNOSIS — R531 Weakness: Secondary | ICD-10-CM | POA: Diagnosis not present

## 2020-02-07 DIAGNOSIS — M810 Age-related osteoporosis without current pathological fracture: Secondary | ICD-10-CM | POA: Diagnosis not present

## 2020-02-08 DIAGNOSIS — M810 Age-related osteoporosis without current pathological fracture: Secondary | ICD-10-CM | POA: Diagnosis not present

## 2020-02-08 DIAGNOSIS — R531 Weakness: Secondary | ICD-10-CM | POA: Diagnosis not present

## 2020-02-08 DIAGNOSIS — M6281 Muscle weakness (generalized): Secondary | ICD-10-CM | POA: Diagnosis not present

## 2020-02-08 DIAGNOSIS — I110 Hypertensive heart disease with heart failure: Secondary | ICD-10-CM | POA: Diagnosis not present

## 2020-02-08 DIAGNOSIS — I4891 Unspecified atrial fibrillation: Secondary | ICD-10-CM | POA: Diagnosis not present

## 2020-02-08 DIAGNOSIS — I5030 Unspecified diastolic (congestive) heart failure: Secondary | ICD-10-CM | POA: Diagnosis not present

## 2020-02-08 DIAGNOSIS — I6990 Unspecified sequelae of unspecified cerebrovascular disease: Secondary | ICD-10-CM | POA: Diagnosis not present

## 2020-02-08 DIAGNOSIS — R278 Other lack of coordination: Secondary | ICD-10-CM | POA: Diagnosis not present

## 2020-02-11 DIAGNOSIS — I6990 Unspecified sequelae of unspecified cerebrovascular disease: Secondary | ICD-10-CM | POA: Diagnosis not present

## 2020-02-11 DIAGNOSIS — M6281 Muscle weakness (generalized): Secondary | ICD-10-CM | POA: Diagnosis not present

## 2020-02-11 DIAGNOSIS — I110 Hypertensive heart disease with heart failure: Secondary | ICD-10-CM | POA: Diagnosis not present

## 2020-02-11 DIAGNOSIS — I5030 Unspecified diastolic (congestive) heart failure: Secondary | ICD-10-CM | POA: Diagnosis not present

## 2020-02-11 DIAGNOSIS — R531 Weakness: Secondary | ICD-10-CM | POA: Diagnosis not present

## 2020-02-11 DIAGNOSIS — M810 Age-related osteoporosis without current pathological fracture: Secondary | ICD-10-CM | POA: Diagnosis not present

## 2020-02-11 DIAGNOSIS — I4891 Unspecified atrial fibrillation: Secondary | ICD-10-CM | POA: Diagnosis not present

## 2020-02-11 DIAGNOSIS — R278 Other lack of coordination: Secondary | ICD-10-CM | POA: Diagnosis not present

## 2020-02-12 DIAGNOSIS — Z20822 Contact with and (suspected) exposure to covid-19: Secondary | ICD-10-CM | POA: Diagnosis not present

## 2020-02-12 DIAGNOSIS — I5030 Unspecified diastolic (congestive) heart failure: Secondary | ICD-10-CM | POA: Diagnosis not present

## 2020-02-12 DIAGNOSIS — R278 Other lack of coordination: Secondary | ICD-10-CM | POA: Diagnosis not present

## 2020-02-12 DIAGNOSIS — I110 Hypertensive heart disease with heart failure: Secondary | ICD-10-CM | POA: Diagnosis not present

## 2020-02-12 DIAGNOSIS — R531 Weakness: Secondary | ICD-10-CM | POA: Diagnosis not present

## 2020-02-12 DIAGNOSIS — I4891 Unspecified atrial fibrillation: Secondary | ICD-10-CM | POA: Diagnosis not present

## 2020-02-12 DIAGNOSIS — M810 Age-related osteoporosis without current pathological fracture: Secondary | ICD-10-CM | POA: Diagnosis not present

## 2020-02-12 DIAGNOSIS — M6281 Muscle weakness (generalized): Secondary | ICD-10-CM | POA: Diagnosis not present

## 2020-02-12 DIAGNOSIS — I6990 Unspecified sequelae of unspecified cerebrovascular disease: Secondary | ICD-10-CM | POA: Diagnosis not present

## 2020-02-13 DIAGNOSIS — I5030 Unspecified diastolic (congestive) heart failure: Secondary | ICD-10-CM | POA: Diagnosis not present

## 2020-02-13 DIAGNOSIS — I110 Hypertensive heart disease with heart failure: Secondary | ICD-10-CM | POA: Diagnosis not present

## 2020-02-13 DIAGNOSIS — R278 Other lack of coordination: Secondary | ICD-10-CM | POA: Diagnosis not present

## 2020-02-13 DIAGNOSIS — R531 Weakness: Secondary | ICD-10-CM | POA: Diagnosis not present

## 2020-02-13 DIAGNOSIS — M810 Age-related osteoporosis without current pathological fracture: Secondary | ICD-10-CM | POA: Diagnosis not present

## 2020-02-13 DIAGNOSIS — M6281 Muscle weakness (generalized): Secondary | ICD-10-CM | POA: Diagnosis not present

## 2020-02-13 DIAGNOSIS — I4891 Unspecified atrial fibrillation: Secondary | ICD-10-CM | POA: Diagnosis not present

## 2020-02-13 DIAGNOSIS — I6990 Unspecified sequelae of unspecified cerebrovascular disease: Secondary | ICD-10-CM | POA: Diagnosis not present

## 2020-02-14 ENCOUNTER — Inpatient Hospital Stay (HOSPITAL_COMMUNITY)
Admission: EM | Admit: 2020-02-14 | Discharge: 2020-02-19 | DRG: 065 | Disposition: A | Discharge status: Skilled Nursing Facility | Payer: Medicare Other | Source: Skilled Nursing Facility | Attending: Internal Medicine | Admitting: Internal Medicine

## 2020-02-14 ENCOUNTER — Emergency Department (HOSPITAL_COMMUNITY): Payer: Medicare Other

## 2020-02-14 ENCOUNTER — Ambulatory Visit (INDEPENDENT_AMBULATORY_CARE_PROVIDER_SITE_OTHER): Payer: Medicare Other | Admitting: Gastroenterology

## 2020-02-14 ENCOUNTER — Encounter (HOSPITAL_COMMUNITY): Payer: Self-pay | Admitting: Emergency Medicine

## 2020-02-14 DIAGNOSIS — I5032 Chronic diastolic (congestive) heart failure: Secondary | ICD-10-CM | POA: Diagnosis present

## 2020-02-14 DIAGNOSIS — M2550 Pain in unspecified joint: Secondary | ICD-10-CM | POA: Diagnosis not present

## 2020-02-14 DIAGNOSIS — R404 Transient alteration of awareness: Secondary | ICD-10-CM | POA: Diagnosis not present

## 2020-02-14 DIAGNOSIS — R471 Dysarthria and anarthria: Secondary | ICD-10-CM | POA: Diagnosis present

## 2020-02-14 DIAGNOSIS — E785 Hyperlipidemia, unspecified: Secondary | ICD-10-CM | POA: Diagnosis present

## 2020-02-14 DIAGNOSIS — I5181 Takotsubo syndrome: Secondary | ICD-10-CM | POA: Diagnosis not present

## 2020-02-14 DIAGNOSIS — I6340 Cerebral infarction due to embolism of unspecified cerebral artery: Secondary | ICD-10-CM | POA: Diagnosis not present

## 2020-02-14 DIAGNOSIS — G9341 Metabolic encephalopathy: Secondary | ICD-10-CM | POA: Diagnosis not present

## 2020-02-14 DIAGNOSIS — F32A Depression, unspecified: Secondary | ICD-10-CM | POA: Diagnosis not present

## 2020-02-14 DIAGNOSIS — Z7901 Long term (current) use of anticoagulants: Secondary | ICD-10-CM | POA: Diagnosis not present

## 2020-02-14 DIAGNOSIS — I110 Hypertensive heart disease with heart failure: Secondary | ICD-10-CM | POA: Diagnosis present

## 2020-02-14 DIAGNOSIS — Z8673 Personal history of transient ischemic attack (TIA), and cerebral infarction without residual deficits: Secondary | ICD-10-CM

## 2020-02-14 DIAGNOSIS — F419 Anxiety disorder, unspecified: Secondary | ICD-10-CM | POA: Diagnosis present

## 2020-02-14 DIAGNOSIS — I639 Cerebral infarction, unspecified: Secondary | ICD-10-CM | POA: Diagnosis not present

## 2020-02-14 DIAGNOSIS — Z96612 Presence of left artificial shoulder joint: Secondary | ICD-10-CM | POA: Diagnosis not present

## 2020-02-14 DIAGNOSIS — G8191 Hemiplegia, unspecified affecting right dominant side: Secondary | ICD-10-CM | POA: Diagnosis not present

## 2020-02-14 DIAGNOSIS — F418 Other specified anxiety disorders: Secondary | ICD-10-CM | POA: Diagnosis present

## 2020-02-14 DIAGNOSIS — Z9049 Acquired absence of other specified parts of digestive tract: Secondary | ICD-10-CM | POA: Diagnosis not present

## 2020-02-14 DIAGNOSIS — K219 Gastro-esophageal reflux disease without esophagitis: Secondary | ICD-10-CM | POA: Diagnosis present

## 2020-02-14 DIAGNOSIS — E039 Hypothyroidism, unspecified: Secondary | ICD-10-CM | POA: Diagnosis not present

## 2020-02-14 DIAGNOSIS — Z8261 Family history of arthritis: Secondary | ICD-10-CM

## 2020-02-14 DIAGNOSIS — Z880 Allergy status to penicillin: Secondary | ICD-10-CM | POA: Diagnosis not present

## 2020-02-14 DIAGNOSIS — H9190 Unspecified hearing loss, unspecified ear: Secondary | ICD-10-CM | POA: Diagnosis present

## 2020-02-14 DIAGNOSIS — Z20822 Contact with and (suspected) exposure to covid-19: Secondary | ICD-10-CM | POA: Diagnosis not present

## 2020-02-14 DIAGNOSIS — R569 Unspecified convulsions: Secondary | ICD-10-CM | POA: Diagnosis not present

## 2020-02-14 DIAGNOSIS — M6281 Muscle weakness (generalized): Secondary | ICD-10-CM | POA: Diagnosis not present

## 2020-02-14 DIAGNOSIS — I10 Essential (primary) hypertension: Secondary | ICD-10-CM

## 2020-02-14 DIAGNOSIS — Z825 Family history of asthma and other chronic lower respiratory diseases: Secondary | ICD-10-CM

## 2020-02-14 DIAGNOSIS — I6782 Cerebral ischemia: Secondary | ICD-10-CM | POA: Diagnosis not present

## 2020-02-14 DIAGNOSIS — F0390 Unspecified dementia without behavioral disturbance: Secondary | ICD-10-CM | POA: Diagnosis present

## 2020-02-14 DIAGNOSIS — H748X3 Other specified disorders of middle ear and mastoid, bilateral: Secondary | ICD-10-CM | POA: Diagnosis not present

## 2020-02-14 DIAGNOSIS — Z7401 Bed confinement status: Secondary | ICD-10-CM | POA: Diagnosis not present

## 2020-02-14 DIAGNOSIS — R531 Weakness: Secondary | ICD-10-CM | POA: Diagnosis present

## 2020-02-14 DIAGNOSIS — G4700 Insomnia, unspecified: Secondary | ICD-10-CM | POA: Diagnosis present

## 2020-02-14 DIAGNOSIS — Z833 Family history of diabetes mellitus: Secondary | ICD-10-CM

## 2020-02-14 DIAGNOSIS — I4821 Permanent atrial fibrillation: Secondary | ICD-10-CM | POA: Diagnosis present

## 2020-02-14 DIAGNOSIS — Z800 Family history of malignant neoplasm of digestive organs: Secondary | ICD-10-CM

## 2020-02-14 DIAGNOSIS — R29818 Other symptoms and signs involving the nervous system: Secondary | ICD-10-CM | POA: Diagnosis not present

## 2020-02-14 DIAGNOSIS — S0003XA Contusion of scalp, initial encounter: Secondary | ICD-10-CM | POA: Diagnosis present

## 2020-02-14 DIAGNOSIS — I429 Cardiomyopathy, unspecified: Secondary | ICD-10-CM | POA: Diagnosis not present

## 2020-02-14 DIAGNOSIS — I5021 Acute systolic (congestive) heart failure: Secondary | ICD-10-CM | POA: Diagnosis not present

## 2020-02-14 DIAGNOSIS — M542 Cervicalgia: Secondary | ICD-10-CM | POA: Diagnosis not present

## 2020-02-14 DIAGNOSIS — I5042 Chronic combined systolic (congestive) and diastolic (congestive) heart failure: Secondary | ICD-10-CM | POA: Diagnosis not present

## 2020-02-14 DIAGNOSIS — R4701 Aphasia: Secondary | ICD-10-CM | POA: Diagnosis present

## 2020-02-14 DIAGNOSIS — I6990 Unspecified sequelae of unspecified cerebrovascular disease: Secondary | ICD-10-CM | POA: Diagnosis not present

## 2020-02-14 DIAGNOSIS — Z79899 Other long term (current) drug therapy: Secondary | ICD-10-CM

## 2020-02-14 DIAGNOSIS — Z8619 Personal history of other infectious and parasitic diseases: Secondary | ICD-10-CM

## 2020-02-14 DIAGNOSIS — I6389 Other cerebral infarction: Secondary | ICD-10-CM | POA: Diagnosis not present

## 2020-02-14 DIAGNOSIS — G9389 Other specified disorders of brain: Secondary | ICD-10-CM | POA: Diagnosis not present

## 2020-02-14 DIAGNOSIS — S2231XA Fracture of one rib, right side, initial encounter for closed fracture: Secondary | ICD-10-CM | POA: Diagnosis not present

## 2020-02-14 DIAGNOSIS — Y92129 Unspecified place in nursing home as the place of occurrence of the external cause: Secondary | ICD-10-CM | POA: Diagnosis not present

## 2020-02-14 DIAGNOSIS — M810 Age-related osteoporosis without current pathological fracture: Secondary | ICD-10-CM | POA: Diagnosis not present

## 2020-02-14 DIAGNOSIS — Z96651 Presence of right artificial knee joint: Secondary | ICD-10-CM | POA: Diagnosis not present

## 2020-02-14 DIAGNOSIS — I700 Atherosclerosis of aorta: Secondary | ICD-10-CM | POA: Diagnosis present

## 2020-02-14 DIAGNOSIS — W19XXXA Unspecified fall, initial encounter: Secondary | ICD-10-CM | POA: Diagnosis present

## 2020-02-14 DIAGNOSIS — G9340 Encephalopathy, unspecified: Secondary | ICD-10-CM | POA: Diagnosis present

## 2020-02-14 DIAGNOSIS — R54 Age-related physical debility: Secondary | ICD-10-CM | POA: Diagnosis present

## 2020-02-14 DIAGNOSIS — E876 Hypokalemia: Secondary | ICD-10-CM | POA: Diagnosis present

## 2020-02-14 DIAGNOSIS — Z8249 Family history of ischemic heart disease and other diseases of the circulatory system: Secondary | ICD-10-CM | POA: Diagnosis not present

## 2020-02-14 DIAGNOSIS — I493 Ventricular premature depolarization: Secondary | ICD-10-CM | POA: Diagnosis present

## 2020-02-14 DIAGNOSIS — I499 Cardiac arrhythmia, unspecified: Secondary | ICD-10-CM | POA: Diagnosis not present

## 2020-02-14 DIAGNOSIS — I4891 Unspecified atrial fibrillation: Secondary | ICD-10-CM | POA: Diagnosis not present

## 2020-02-14 DIAGNOSIS — G459 Transient cerebral ischemic attack, unspecified: Secondary | ICD-10-CM | POA: Diagnosis not present

## 2020-02-14 DIAGNOSIS — G40909 Epilepsy, unspecified, not intractable, without status epilepticus: Secondary | ICD-10-CM | POA: Diagnosis present

## 2020-02-14 DIAGNOSIS — R278 Other lack of coordination: Secondary | ICD-10-CM | POA: Diagnosis not present

## 2020-02-14 DIAGNOSIS — S199XXA Unspecified injury of neck, initial encounter: Secondary | ICD-10-CM | POA: Diagnosis not present

## 2020-02-14 DIAGNOSIS — I517 Cardiomegaly: Secondary | ICD-10-CM | POA: Diagnosis not present

## 2020-02-14 DIAGNOSIS — I5030 Unspecified diastolic (congestive) heart failure: Secondary | ICD-10-CM | POA: Diagnosis not present

## 2020-02-14 DIAGNOSIS — S0990XA Unspecified injury of head, initial encounter: Secondary | ICD-10-CM | POA: Diagnosis not present

## 2020-02-14 DIAGNOSIS — R29810 Facial weakness: Secondary | ICD-10-CM | POA: Diagnosis present

## 2020-02-14 DIAGNOSIS — Z743 Need for continuous supervision: Secondary | ICD-10-CM | POA: Diagnosis not present

## 2020-02-14 DIAGNOSIS — R0902 Hypoxemia: Secondary | ICD-10-CM | POA: Diagnosis not present

## 2020-02-14 LAB — CBC
HCT: 45.2 % (ref 36.0–46.0)
Hemoglobin: 14.4 g/dL (ref 12.0–15.0)
MCH: 31.2 pg (ref 26.0–34.0)
MCHC: 31.9 g/dL (ref 30.0–36.0)
MCV: 97.8 fL (ref 80.0–100.0)
Platelets: 414 10*3/uL — ABNORMAL HIGH (ref 150–400)
RBC: 4.62 MIL/uL (ref 3.87–5.11)
RDW: 12.3 % (ref 11.5–15.5)
WBC: 11.3 10*3/uL — ABNORMAL HIGH (ref 4.0–10.5)
nRBC: 0 % (ref 0.0–0.2)

## 2020-02-14 LAB — DIFFERENTIAL
Abs Immature Granulocytes: 0.08 10*3/uL — ABNORMAL HIGH (ref 0.00–0.07)
Basophils Absolute: 0.1 10*3/uL (ref 0.0–0.1)
Basophils Relative: 1 %
Eosinophils Absolute: 0 10*3/uL (ref 0.0–0.5)
Eosinophils Relative: 0 %
Immature Granulocytes: 1 %
Lymphocytes Relative: 11 %
Lymphs Abs: 1.3 10*3/uL (ref 0.7–4.0)
Monocytes Absolute: 1.2 10*3/uL — ABNORMAL HIGH (ref 0.1–1.0)
Monocytes Relative: 11 %
Neutro Abs: 8.7 10*3/uL — ABNORMAL HIGH (ref 1.7–7.7)
Neutrophils Relative %: 76 %

## 2020-02-14 LAB — COMPREHENSIVE METABOLIC PANEL
ALT: 22 U/L (ref 0–44)
AST: 26 U/L (ref 15–41)
Albumin: 3.1 g/dL — ABNORMAL LOW (ref 3.5–5.0)
Alkaline Phosphatase: 117 U/L (ref 38–126)
Anion gap: 14 (ref 5–15)
BUN: 25 mg/dL — ABNORMAL HIGH (ref 8–23)
CO2: 22 mmol/L (ref 22–32)
Calcium: 8.4 mg/dL — ABNORMAL LOW (ref 8.9–10.3)
Chloride: 96 mmol/L — ABNORMAL LOW (ref 98–111)
Creatinine, Ser: 1.01 mg/dL — ABNORMAL HIGH (ref 0.44–1.00)
GFR, Estimated: 55 mL/min — ABNORMAL LOW (ref >60)
Glucose, Bld: 143 mg/dL — ABNORMAL HIGH (ref 70–99)
Potassium: 4.4 mmol/L (ref 3.5–5.1)
Sodium: 132 mmol/L — ABNORMAL LOW (ref 135–145)
Total Bilirubin: 0.6 mg/dL (ref 0.3–1.2)
Total Protein: 6.1 g/dL — ABNORMAL LOW (ref 6.5–8.1)

## 2020-02-14 LAB — RESP PANEL BY RT-PCR (FLU A&B, COVID) ARPGX2
Influenza A by PCR: NEGATIVE
Influenza B by PCR: NEGATIVE
SARS Coronavirus 2 by RT PCR: NEGATIVE

## 2020-02-14 LAB — I-STAT CHEM 8, ED
BUN: 28 mg/dL — ABNORMAL HIGH (ref 8–23)
Calcium, Ion: 0.88 mmol/L — CL (ref 1.15–1.40)
Chloride: 95 mmol/L — ABNORMAL LOW (ref 98–111)
Creatinine, Ser: 0.8 mg/dL (ref 0.44–1.00)
Glucose, Bld: 144 mg/dL — ABNORMAL HIGH (ref 70–99)
HCT: 44 % (ref 36.0–46.0)
Hemoglobin: 15 g/dL (ref 12.0–15.0)
Potassium: 4.1 mmol/L (ref 3.5–5.1)
Sodium: 132 mmol/L — ABNORMAL LOW (ref 135–145)
TCO2: 24 mmol/L (ref 22–32)

## 2020-02-14 LAB — PROTIME-INR
INR: 1.1 (ref 0.8–1.2)
Prothrombin Time: 14.2 seconds (ref 11.4–15.2)

## 2020-02-14 LAB — APTT: aPTT: 31 seconds (ref 24–36)

## 2020-02-14 LAB — T4, FREE: Free T4: 1.61 ng/dL — ABNORMAL HIGH (ref 0.61–1.12)

## 2020-02-14 LAB — ETHANOL: Alcohol, Ethyl (B): <10 mg/dL (ref <10)

## 2020-02-14 LAB — CBG MONITORING, ED: Glucose-Capillary: 148 mg/dL — ABNORMAL HIGH (ref 70–99)

## 2020-02-14 LAB — DIGOXIN LEVEL: Digoxin Level: <0.2 ng/mL — ABNORMAL LOW (ref 0.8–2.0)

## 2020-02-14 LAB — PHENYTOIN LEVEL, TOTAL: Phenytoin Lvl: <2.5 ug/mL — ABNORMAL LOW (ref 10.0–20.0)

## 2020-02-14 LAB — TSH: TSH: 5.03 u[IU]/mL — ABNORMAL HIGH (ref 0.350–4.500)

## 2020-02-14 MED ORDER — STROKE: EARLY STAGES OF RECOVERY BOOK
Freq: Once | Status: AC
  Filled 2020-02-14: qty 1

## 2020-02-14 MED ORDER — LORAZEPAM 2 MG/ML IJ SOLN
0.5000 mg | Freq: Four times a day (QID) | INTRAMUSCULAR | Status: DC | PRN
  Administered 2020-02-15: 0.5 mg via INTRAVENOUS
  Filled 2020-02-14: qty 1

## 2020-02-14 MED ORDER — METOPROLOL SUCCINATE ER 100 MG PO TB24: 100.0000 mg | ORAL_TABLET | Freq: Every day | ORAL | Status: DC

## 2020-02-14 MED ORDER — ACETAMINOPHEN 325 MG PO TABS
650.0000 mg | ORAL_TABLET | ORAL | Status: DC | PRN
  Administered 2020-02-15: 650 mg via ORAL
  Filled 2020-02-14: qty 2

## 2020-02-14 MED ORDER — SENNOSIDES-DOCUSATE SODIUM 8.6-50 MG PO TABS: 1.0000 | ORAL_TABLET | Freq: Every evening | ORAL | Status: DC | PRN

## 2020-02-14 MED ORDER — ACETAMINOPHEN 650 MG RE SUPP: 650.0000 mg | RECTAL | Status: DC | PRN

## 2020-02-14 MED ORDER — LEVOTHYROXINE SODIUM 100 MCG/5ML IV SOLN: 37.5000 ug | Freq: Every day | INTRAVENOUS | Status: DC

## 2020-02-14 MED ORDER — LEVOTHYROXINE SODIUM 75 MCG PO TABS: 75.0000 ug | ORAL_TABLET | Freq: Every day | ORAL | Status: DC

## 2020-02-14 MED ORDER — DIGOXIN 0.25 MG/ML IJ SOLN
0.2500 mg | Freq: Once | INTRAMUSCULAR | Status: AC
  Administered 2020-02-14: 0.25 mg via INTRAVENOUS
  Filled 2020-02-14: qty 1

## 2020-02-14 MED ORDER — PHENYTOIN SODIUM EXTENDED 100 MG PO CAPS
100.0000 mg | ORAL_CAPSULE | Freq: Three times a day (TID) | ORAL | Status: DC
  Filled 2020-02-14: qty 1

## 2020-02-14 MED ORDER — SODIUM CHLORIDE 0.9% FLUSH: 10.0000 mL | INTRAVENOUS | Status: DC | PRN

## 2020-02-14 MED ORDER — TRAZODONE HCL 50 MG PO TABS: 50.0000 mg | ORAL_TABLET | Freq: Every day | ORAL | Status: DC

## 2020-02-14 MED ORDER — EDOXABAN TOSYLATE 30 MG PO TABS
30.0000 mg | ORAL_TABLET | ORAL | Status: DC
  Administered 2020-02-15: 30 mg via ORAL
  Filled 2020-02-14 (×2): qty 1

## 2020-02-14 MED ORDER — TAMSULOSIN HCL 0.4 MG PO CAPS
0.4000 mg | ORAL_CAPSULE | Freq: Every day | ORAL | Status: DC
Start: 2020-02-14 — End: 2020-02-19
  Administered 2020-02-16 – 2020-02-19 (×4): 0.4 mg via ORAL
  Filled 2020-02-14 (×5): qty 1

## 2020-02-14 MED ORDER — ACETAMINOPHEN 160 MG/5ML PO SOLN: 650.0000 mg | ORAL | Status: DC | PRN

## 2020-02-14 MED ORDER — BUSPIRONE HCL 10 MG PO TABS
10.0000 mg | ORAL_TABLET | Freq: Two times a day (BID) | ORAL | Status: DC
  Administered 2020-02-15 – 2020-02-19 (×8): 10 mg via ORAL
  Filled 2020-02-14 (×9): qty 1

## 2020-02-14 MED ORDER — METOPROLOL TARTRATE 5 MG/5ML IV SOLN
2.5000 mg | Freq: Four times a day (QID) | INTRAVENOUS | Status: DC | PRN
  Filled 2020-02-14: qty 5

## 2020-02-14 MED ORDER — SODIUM CHLORIDE 0.9% FLUSH
10.0000 mL | Freq: Two times a day (BID) | INTRAVENOUS | Status: DC
  Administered 2020-02-15 – 2020-02-18 (×8): 10 mL

## 2020-02-14 MED ORDER — DIGOXIN 125 MCG PO TABS
0.0625 mg | ORAL_TABLET | Freq: Every day | ORAL | Status: DC
  Administered 2020-02-16 – 2020-02-17 (×2): 0.0625 mg via ORAL
  Filled 2020-02-14 (×3): qty 1

## 2020-02-14 MED ORDER — CALCIUM CARBONATE 1250 (500 CA) MG PO TABS
1250.0000 mg | ORAL_TABLET | Freq: Every day | ORAL | Status: DC
  Administered 2020-02-16 – 2020-02-19 (×4): 1250 mg via ORAL
  Filled 2020-02-14 (×5): qty 1

## 2020-02-14 MED ORDER — IOHEXOL 350 MG/ML SOLN
75.0000 mL | Freq: Once | INTRAVENOUS | Status: AC | PRN
  Administered 2020-02-14: 75 mL via INTRAVENOUS

## 2020-02-14 MED ORDER — DILTIAZEM HCL-DEXTROSE 125-5 MG/125ML-% IV SOLN (PREMIX)
5.0000 mg/h | INTRAVENOUS | Status: DC
  Administered 2020-02-14: 15 mg/h via INTRAVENOUS
  Administered 2020-02-14: 5 mg/h via INTRAVENOUS
  Administered 2020-02-15 (×3): 15 mg/h via INTRAVENOUS
  Filled 2020-02-14 (×7): qty 125

## 2020-02-14 MED ORDER — ATORVASTATIN CALCIUM 10 MG PO TABS
20.0000 mg | ORAL_TABLET | ORAL | Status: DC
  Filled 2020-02-14: qty 2

## 2020-02-14 MED ORDER — SERTRALINE HCL 50 MG PO TABS
50.0000 mg | ORAL_TABLET | Freq: Every day | ORAL | Status: DC
  Administered 2020-02-16 – 2020-02-19 (×4): 50 mg via ORAL
  Filled 2020-02-14 (×6): qty 1

## 2020-02-14 MED ORDER — SODIUM CHLORIDE 0.9 % IV SOLN: INTRAVENOUS | Status: AC

## 2020-02-14 NOTE — H&P (Signed)
History and Physical    [name] QIW:[id number] DOB: 05-09-1932 DOA: 02/14/2020  PCP: Dettinger, Fransisca Kaufmann, MD (Confirm with patient/family/NH records and if not entered, this has to be entered at [facility] point of entry) Patient coming from: SNF  I have personally briefly reviewed patient's old medical records in [place]  Chief Complaint: I am Ok  HPI: [name] is a 87 y.o. female with medical history significant of advanced dementia, chronic A. fib on systemic anticoagulation, HTN, HLD, hypothyroidism, anxiety/depression, presented with strokelike symptoms.  Patient demented at baseline, unable to provide any history, son reported that the patient might have a mini stroke in the past but denied patient had any residual weakness or facial droop. In addition, most history provided by ER staff and physician.  Patient was last seen normal around 1000 AM this morning eating and 1005 AM patient was found on the floor, awake and more confused.  Nursing home staff tried to pick the patient up and found patient had an new right-sided weakness. ED Course: Code stroke was called, CT head negative for acute finding, CTA negative for large caliber occlusion in brain and neck vessels.  And patient was found to be in rapid A. fib.  Cardizem drip started in the ED.  Review of Systems: Unable to perform, patient baseline demented.  Past Medical History:  Diagnosis Date  . Anemia   . Atrial fibrillation ([place])   . Calf pain    September, 2012, at rest  . Carotid bruit    Doppler, December, 2009, no abnormality  . Cataract   . Diverticulosis   . GERD (gastroesophageal reflux disease)   . History of shingles   . HLD (hyperlipidemia)   . HTN (hypertension)   . Hypothyroidism   . Insomnia   . Osteoarthritis   . PONV (postoperative nausea and vomiting)   . Rectal bleeding 2001   diverticulosis and int hemorrhoids on 07/1999 and 02/2010 colonoscopies.  . Seizures ([place])   . Stroke ([place])    . Warfarin anticoagulation     Past Surgical History:  Procedure Laterality Date  . BIOPSY  09/29/2015   Procedure: BIOPSY;  Surgeon: Rogene Houston, MD;  [facility];  Service: Endoscopy;;  gastric  . CATARACT EXTRACTION W/PHACO Right 04/15/2014   Procedure: CATARACT EXTRACTION PHACO AND INTRAOCULAR LENS PLACEMENT (IOC);  Surgeon: Tonny Branch, MD;  Location: [facility];  Service: Ophthalmology;  Laterality: Right;  CDE: 13.30  . CATARACT EXTRACTION W/PHACO Left 05/13/2014   Procedure: CATARACT EXTRACTION PHACO AND INTRAOCULAR LENS PLACEMENT LEFT EYE;  Surgeon: Tonny Branch, MD;  Location: [facility];  Service: Ophthalmology;  Laterality: Left;  CDE:13.00  . CHOLECYSTECTOMY N/A 01/15/2015   Procedure: LAPAROSCOPIC CHOLECYSTECTOMY;  Surgeon: Ralene Ok, MD;  Location: [place];  Service: General;  Laterality: N/A;  . COLONOSCOPY N/A 09/29/2015   Procedure: COLONOSCOPY;  Surgeon: Rogene Houston, MD;  [facility];  Service: Endoscopy;  Laterality: N/A;  . COLONOSCOPY    . ESOPHAGOGASTRODUODENOSCOPY N/A 01/13/2015   Procedure: ESOPHAGOGASTRODUODENOSCOPY (EGD);  Surgeon: Jerene Bears, MD;  Location: [facility];  Service: Endoscopy;  Laterality: N/A;  . ESOPHAGOGASTRODUODENOSCOPY N/A 09/29/2015   Procedure: ESOPHAGOGASTRODUODENOSCOPY (EGD);  Surgeon: Rogene Houston, MD;  [facility];  Service: Endoscopy;  Laterality: N/A;  12:15  . GALLBLADDER SURGERY    . LEFT HEART CATH AND CORONARY ANGIOGRAPHY N/A 06/22/2016   Procedure: Left Heart Cath and Coronary Angiography;  Surgeon: Shelva Majestic  A, MD;  Location: [place] CV LAB;  Service: Cardiovascular;  Laterality: N/A;  . SHOULDER OPEN ROTATOR CUFF REPAIR     bilateral  . TOTAL KNEE ARTHROPLASTY  05/26/10   right  . TOTAL KNEE ARTHROPLASTY  11/17/2010   Procedure: TOTAL KNEE ARTHROPLASTY;  Surgeon: Mauri Pole;  Location: [facility];  Service: Orthopedics;  Laterality: Left;  . TOTAL KNEE ARTHROPLASTY     Right  . TOTAL  SHOULDER REPLACEMENT  01/2010   left     reports that she has never smoked. She has never used smokeless tobacco. She reports that she does not drink alcohol and does not use drugs.  Allergies  Allergen Reactions  . Penicillins Rash and Other (See Comments)    Has patient had a PCN reaction causing immediate rash, facial/tongue/throat swelling, SOB or lightheadedness with hypotension: No Has patient had a PCN reaction causing severe rash involving mucus membranes or skin necrosis: No Has patient had a PCN reaction that required hospitalization No Has patient had a PCN reaction occurring within the last 10 years: No If all of the above answers are "NO", then may proceed with Cephalosporin use.    Family History  Problem Relation Age of Onset  . COPD Brother   . Atrial fibrillation Brother   . Diabetes Brother   . Liver cancer Brother        Alcohol-related  . Colon cancer Brother   . Heart attack Father   . Early death Father   . Heart disease Father   . Rheum arthritis Mother   . Arthritis Mother   . Heart disease Mother   . Coronary artery disease Brother   . Heart attack Daughter   . Gallbladder disease Brother      Prior to Admission medications   Medication Sig Start Date End Date Taking? Authorizing Provider  Acetaminophen (TYLENOL) 325 MG CAPS Take 650 mg by mouth every 6 (six) hours as needed for mild pain.    [provider]  ARTIFICIAL TEAR OP Apply 2 drops to eye daily as needed ([place]). To the left eye    [provider]  atorvastatin (LIPITOR) 20 MG tablet Take 1 tablet (20 mg total) by mouth every other day. 09/14/19   Dettinger, Fransisca Kaufmann, MD  busPIRone (BUSPAR) 10 MG tablet Take 1 tablet (10 mg total) by mouth 2 (two) times daily. 10/24/19   Dettinger, Fransisca Kaufmann, MD  calcium carbonate (OS-CAL) 1250 (500 Ca) MG chewable tablet Chew 1 tablet by mouth daily.    [provider]  clotrimazole-betamethasone (LOTRISONE) cream Apply topically  2 times daily to affected areas until rash clears Patient taking differently: Apply 1 application topically 2 (two) times daily.  01/22/19   Dettinger, Fransisca Kaufmann, MD  digoxin (LANOXIN) 0.125 MG tablet Take 0.5 tablets (0.0625 mg total) by mouth daily. 04/12/19   Minus Breeding, MD  diltiazem (CARDIZEM CD) 240 MG 24 hr capsule TAKE (1) CAPSULE DAILY Patient taking differently: Take 240 mg by mouth daily.  06/07/19   Minus Breeding, MD  donepezil (ARICEPT) 5 MG tablet TAKE ONE TABLET AT BEDTIME FOR MEMORY 08/08/19   Cameron Sprang, MD  GNP MELATONIN MAXIMUM STRENGTH 5 MG TABS TAKE ONE TABLET AT BEDTIME 10/24/19   Dettinger, Fransisca Kaufmann, MD  hydrALAZINE (APRESOLINE) 10 MG tablet TAKE 1 TABLET 2 TIMES A DAY 10/24/19   Dettinger, Fransisca Kaufmann, MD  hydrocortisone (ANUSOL-HC) 25 MG suppository Place 1 suppository (25 mg total) rectally 2 (two) times  daily. 08/16/19   Dettinger, Fransisca Kaufmann, MD  levothyroxine (SYNTHROID) 75 MCG tablet TAKE 1 TABLET ONCE DAILY 09/21/19   Dettinger, Fransisca Kaufmann, MD  loperamide (IMODIUM) 2 MG capsule TAKE (1) CAPSULE FOUR TIMES DAILY AS NEEDED FOR DIARRHEA OR LOOSE STOOLS Patient taking differently: Take 2 mg by mouth 4 (four) times daily as needed.  04/11/19   Dettinger, Fransisca Kaufmann, MD  metoprolol succinate (TOPROL-XL) 100 MG 24 hr tablet TAKE 1 TABLET EVERY DAY FOLLOWING A MEAL Patient taking differently: Take 100 mg by mouth daily.  08/30/19   Dettinger, Fransisca Kaufmann, MD  Multiple Vitamin (MULTIVITAMIN WITH MINERALS) TABS tablet Take 1 tablet by mouth every morning.    [provider]  phenylephrine (HEMORRHOIDAL) 0.25 % suppository Place 1 suppository rectally 2 (two) times daily. 08/31/19   Dettinger, Fransisca Kaufmann, MD  phenytoin (DILANTIN) 100 MG ER capsule Take 1 capsule (100 mg total) by mouth 3 (three) times daily. 08/08/19   Cameron Sprang, MD  potassium chloride SA (KLOR-CON) 20 MEQ tablet Take 1 tablet (20 mEq total) by mouth daily. 11/14/19   Sherwood Gambler, MD  SAVAYSA 30 MG TABS tablet  TAKE 1 TABLET ONCE A DAY FOR AFIB Patient taking differently: Take 30 mg by mouth daily.  10/25/19   Minus Breeding, MD  sertraline (ZOLOFT) 50 MG tablet Take 1 tablet (50 mg total) by mouth daily. 09/14/19   Dettinger, Fransisca Kaufmann, MD  tamsulosin (FLOMAX) 0.4 MG CAPS capsule TAKE (1) CAPSULE DAILY Patient taking differently: Take 0.4 mg by mouth daily.  08/30/19   Dettinger, Fransisca Kaufmann, MD  traZODone (DESYREL) 50 MG tablet TAKE 1/2 TO 1 TABLET AT BEDTIME AS NEEDED FOR SLEEP Patient taking differently: Take 50 mg by mouth at bedtime.  10/25/19   Dettinger, Fransisca Kaufmann, MD    Physical Exam: Vitals:   02/14/20 1145 02/14/20 1200 02/14/20 1209 02/14/20 1300  BP: (!) 141/98 (!) 121/101  (!) 146/104  Pulse: (!) 138 (!) 139  (!) 131  Resp: (!) 25 17  (!) 24  Temp:   (!) 97.3 F (36.3 C)   TempSrc:   Oral   SpO2: 94% 98%  90%    Constitutional: NAD, calm, comfortable Vitals:   02/14/20 1145 02/14/20 1200 02/14/20 1209 02/14/20 1300  BP: (!) 141/98 (!) 121/101  (!) 146/104  Pulse: (!) 138 (!) 139  (!) 131  Resp: (!) 25 17  (!) 24  Temp:   (!) 97.3 F (36.3 C)   TempSrc:   Oral   SpO2: 94% 98%  90%   Eyes: PERRL, lids and conjunctivae normal ENMT: Mucous membranes are moist. Posterior pharynx clear of any exudate or lesions.Normal dentition.  Neck: normal, supple, no masses, no thyromegaly Respiratory: clear to auscultation bilaterally, no wheezing, no crackles. Normal respiratory effort. No accessory muscle use.  Cardiovascular: Irregular heart rate, no murmurs / rubs / gallops. No extremity edema. 2+ pedal pulses. No carotid bruits.  Abdomen: no tenderness, no masses palpated. No hepatosplenomegaly. Bowel sounds positive.  Musculoskeletal: no clubbing / cyanosis. No joint deformity upper and lower extremities. Good ROM, no contractures. Normal muscle tone.  Skin: no rashes, lesions, ulcers. No induration Neurologic: Left-sided facial droop and left-sided tongue deviation, moving all limbs,  not following command, motor strength appears to be symmetrical bilaterally upper and lower extremities  psychiatric: Awake alert demented   Labs on Admission: I have personally reviewed following labs and imaging studies  CBC: Recent Labs  Lab 02/14/20 1100 02/14/20 1117  WBC 11.3*  --   NEUTROABS 8.7*  --   HGB 14.4 15.0  HCT 45.2 44.0  MCV 97.8  --   PLT 414*  --    Basic Metabolic Panel: Recent Labs  Lab 02/14/20 1100 02/14/20 1117  NA 132* 132*  K 4.4 4.1  CL 96* 95*  CO2 22  --   GLUCOSE 143* 144*  BUN 25* 28*  CREATININE 1.01* 0.80  CALCIUM 8.4*  --    GFR: CrCl cannot be calculated (Unknown ideal weight.). Liver Function Tests: Recent Labs  Lab 02/14/20 1100  AST 26  ALT 22  ALKPHOS 117  BILITOT 0.6  PROT 6.1*  ALBUMIN 3.1*   No results for input(s): LIPASE, AMYLASE in the last 168 hours. No results for input(s): AMMONIA in the last 168 hours. Coagulation Profile: Recent Labs  Lab 02/14/20 1100  INR 1.1   Cardiac Enzymes: No results for input(s): CKTOTAL, CKMB, CKMBINDEX, TROPONINI in the last 168 hours. BNP (last 3 results) No results for input(s): PROBNP in the last 8760 hours. HbA1C: No results for input(s): HGBA1C in the last 72 hours. CBG: Recent Labs  Lab 02/14/20 1110  GLUCAP 148*   Lipid Profile: No results for input(s): CHOL, HDL, LDLCALC, TRIG, CHOLHDL, LDLDIRECT in the last 72 hours. Thyroid Function Tests: No results for input(s): TSH, T4TOTAL, FREET4, T3FREE, THYROIDAB in the last 72 hours. Anemia Panel: No results for input(s): VITAMINB12, FOLATE, FERRITIN, TIBC, IRON, RETICCTPCT in the last 72 hours. Urine analysis:    Component Value Date/Time   COLORURINE YELLOW 11/14/2019 1655   APPEARANCEUR HAZY (A) 11/14/2019 1655   APPEARANCEUR Clear 08/29/2019 1527   LABSPEC 1.010 11/14/2019 1655   PHURINE 5.0 11/14/2019 1655   GLUCOSEU NEGATIVE 11/14/2019 1655   HGBUR SMALL (A) 11/14/2019 1655   BILIRUBINUR NEGATIVE  11/14/2019 1655   BILIRUBINUR Negative 08/29/2019 1527   KETONESUR NEGATIVE 11/14/2019 1655   PROTEINUR NEGATIVE 11/14/2019 1655   UROBILINOGEN 0.2 11/09/2010 1235   NITRITE POSITIVE (A) 11/14/2019 1655   LEUKOCYTESUR NEGATIVE 11/14/2019 1655    Radiological Exams on Admission: CT Code Stroke CTA Head W/WO contrast  Result Date: 02/14/2020 CLINICAL DATA:  87 year old female code stroke presentation. Neurologic exam raise the possibility of left ACA or MCA territory insult. EXAM: CT ANGIOGRAPHY HEAD AND NECK TECHNIQUE: Multidetector CT imaging of the head and neck was performed using the standard protocol during bolus administration of intravenous contrast. Multiplanar CT image reconstructions and MIPs were obtained to evaluate the vascular anatomy. Carotid stenosis measurements (when applicable) are obtained utilizing NASCET criteria, using the distal internal carotid diameter as the denominator. CONTRAST:  150 mL OMNIPAQUE IOHEXOL 350 MG/ML SOLN COMPARISON:  Plain head CT 1119 hours today. CTA head and neck 03/01/2018. FINDINGS: The initial arterial bolus attempt was non-diagnostic due to streak artifact from left shoulder arthroplasty interfering with the automatic contrast detection. But the repeat imaging attempt is of superior diagnostic quality. CTA NECK Skeleton: Chronically absent dentition. Cervical spine degeneration. Chronic left shoulder arthroplasty. No acute osseous abnormality identified. Upper chest: Small to moderate layering right pleural effusion. Otherwise stable, negative visible upper lungs. No superior mediastinal lymphadenopathy. Other neck: Stable.  No acute findings. Aortic arch: 3 vessel arch configuration. Chronic tortuosity of the thoracic aorta. Calcified arch atherosclerosis is stable. Right carotid system: Tortuous brachiocephalic artery and right CCA. Similar capacious right carotid bifurcation. Mild calcified plaque at the bifurcation with no stenosis. Tortuous right ICA  is patent to the skull base. Left carotid system:  Similar generalized left carotid ectasia/tortuosity with minimal plaque and no stenosis. Vertebral arteries: Mild plaque and tortuosity of the proximal right subclavian artery without stenosis. Superior visualization of the right vertebral artery origin today, normal. Minimal right V1 calcified plaque without stenosis. Mild right vertebral tortuosity to the skull base. Mild plaque in the proximal left subclavian artery without stenosis. Normal left vertebral artery origin. Tortuous left vertebral is patent to the skull base without stenosis. CTA HEAD Posterior circulation: Patent and normal codominant distal vertebral arteries to the basilar. Patent PICA origins. Patent basilar artery without irregularity or stenosis. Normal SCA and PCA origins. Posterior communicating arteries are diminutive or absent. Bilateral PCA branches are within normal limits. Mildly tortuous left P1. Anterior circulation: Both ICA siphons are patent with mild tortuosity but minimal plaque and no stenosis. Patent carotid termini. Normal MCA and ACA origins. Mildly tortuous A1 segments are patent. Diminutive anterior communicating artery is normal. Bilateral ACA branches are stable and within normal limits. Left MCA M1 segment and trifurcation are patent without stenosis. Left MCA branches are stable and within normal limits. Right MCA M1, right MCA bifurcation, and right MCA branches are stable and within normal limits. Venous sinuses: Patent. Anatomic variants: None. Review of the MIP images confirms the above findings Preliminary results discussed by telephone with Dr. Kerney Elbe on 02/14/2020 at 1138 hours. IMPRESSION: 1. Negative for large vessel occlusion. Stable CTA head and neck since 2020, positive for generalized arterial tortuosity, but with minimal atherosclerosis for age and no arterial stenosis. 2. Small to moderate layering right pleural effusion. 3. Aortic Atherosclerosis  (ICD10-I70.0). Preliminary results discussed by telephone with Dr. Kerney Elbe on 02/14/2020 at 1138 hours. Electronically Signed   By: Genevie [name] M.D.   On: 02/14/2020 11:53   CT Code Stroke CTA Neck W/WO contrast  Result Date: 02/14/2020 CLINICAL DATA:  87 year old female code stroke presentation. Neurologic exam raise the possibility of left ACA or MCA territory insult. EXAM: CT ANGIOGRAPHY HEAD AND NECK TECHNIQUE: Multidetector CT imaging of the head and neck was performed using the standard protocol during bolus administration of intravenous contrast. Multiplanar CT image reconstructions and MIPs were obtained to evaluate the vascular anatomy. Carotid stenosis measurements (when applicable) are obtained utilizing NASCET criteria, using the distal internal carotid diameter as the denominator. CONTRAST:  150 mL OMNIPAQUE IOHEXOL 350 MG/ML SOLN COMPARISON:  Plain head CT 1119 hours today. CTA head and neck 03/01/2018. FINDINGS: The initial arterial bolus attempt was non-diagnostic due to streak artifact from left shoulder arthroplasty interfering with the automatic contrast detection. But the repeat imaging attempt is of superior diagnostic quality. CTA NECK Skeleton: Chronically absent dentition. Cervical spine degeneration. Chronic left shoulder arthroplasty. No acute osseous abnormality identified. Upper chest: Small to moderate layering right pleural effusion. Otherwise stable, negative visible upper lungs. No superior mediastinal lymphadenopathy. Other neck: Stable.  No acute findings. Aortic arch: 3 vessel arch configuration. Chronic tortuosity of the thoracic aorta. Calcified arch atherosclerosis is stable. Right carotid system: Tortuous brachiocephalic artery and right CCA. Similar capacious right carotid bifurcation. Mild calcified plaque at the bifurcation with no stenosis. Tortuous right ICA is patent to the skull base. Left carotid system: Similar generalized left carotid ectasia/tortuosity with  minimal plaque and no stenosis. Vertebral arteries: Mild plaque and tortuosity of the proximal right subclavian artery without stenosis. Superior visualization of the right vertebral artery origin today, normal. Minimal right V1 calcified plaque without stenosis. Mild right vertebral tortuosity to the skull base. Mild plaque in the proximal  left subclavian artery without stenosis. Normal left vertebral artery origin. Tortuous left vertebral is patent to the skull base without stenosis. CTA HEAD Posterior circulation: Patent and normal codominant distal vertebral arteries to the basilar. Patent PICA origins. Patent basilar artery without irregularity or stenosis. Normal SCA and PCA origins. Posterior communicating arteries are diminutive or absent. Bilateral PCA branches are within normal limits. Mildly tortuous left P1. Anterior circulation: Both ICA siphons are patent with mild tortuosity but minimal plaque and no stenosis. Patent carotid termini. Normal MCA and ACA origins. Mildly tortuous A1 segments are patent. Diminutive anterior communicating artery is normal. Bilateral ACA branches are stable and within normal limits. Left MCA M1 segment and trifurcation are patent without stenosis. Left MCA branches are stable and within normal limits. Right MCA M1, right MCA bifurcation, and right MCA branches are stable and within normal limits. Venous sinuses: Patent. Anatomic variants: None. Review of the MIP images confirms the above findings Preliminary results discussed by telephone with Dr. Kerney Elbe on 02/14/2020 at 1138 hours. IMPRESSION: 1. Negative for large vessel occlusion. Stable CTA head and neck since 2020, positive for generalized arterial tortuosity, but with minimal atherosclerosis for age and no arterial stenosis. 2. Small to moderate layering right pleural effusion. 3. Aortic Atherosclerosis (ICD10-I70.0). Preliminary results discussed by telephone with Dr. Kerney Elbe on 02/14/2020 at 1138 hours.  Electronically Signed   By: Genevie [name] M.D.   On: 02/14/2020 11:53   CT Cervical Spine Wo Contrast  Result Date: 02/14/2020 CLINICAL DATA:  Code stroke.  87 year old female.  Neck trauma. EXAM: CT CERVICAL SPINE WITHOUT CONTRAST TECHNIQUE: Multidetector CT imaging of the cervical spine was performed without intravenous contrast. Multiplanar CT image reconstructions were also generated. COMPARISON:  CT cervical spine 02/18/2009. FINDINGS: Alignment: Improved cervical lordosis from 2011. Mild degenerative appearing anterolisthesis of C3 on C4, associated with chronic right side facet arthropathy. Similar anterolisthesis of C7 on T1 with bilateral facet hypertrophy. Skull base and vertebrae: Visualized skull base is intact. No atlanto-occipital dissociation. Osteopenia. C1-C2 appear intact, although with mild to moderate joint space loss on the right. Normal C1-C2 alignment for the degree of head rotation. No acute osseous abnormality identified. Soft tissues and spinal canal: No prevertebral fluid or swelling. No visible canal hematoma. Calcified carotid atherosclerosis in the neck. Disc levels: Widespread cervical spine degeneration but only mild cervical spinal stenosis suspected. Upper chest: Negative. Other: Head CT reported separately. IMPRESSION: 1. No acute traumatic injury identified in the cervical spine. 2. Osteopenia.  Progressed cervical spine degeneration since 2011. Electronically Signed   By: Genevie [name] M.D.   On: 02/14/2020 11:29   DG Chest Portable 1 View  Result Date: 02/14/2020 CLINICAL DATA:  Fall. EXAM: PORTABLE CHEST 1 VIEW COMPARISON:  March 04, 2018. FINDINGS: Stable cardiomegaly. No pneumothorax or pleural effusion is noted. Both lungs are clear. Status post left total shoulder arthroplasty. Old right rib fractures are noted. IMPRESSION: No active disease. Electronically Signed   By: Marijo Conception M.D.   On: 02/14/2020 12:06   CT HEAD CODE STROKE WO CONTRAST  Result Date:  02/14/2020 CLINICAL DATA:  Code stroke.  87 year old female. EXAM: CT HEAD WITHOUT CONTRAST TECHNIQUE: Contiguous axial images were obtained from the base of the skull through the vertex without intravenous contrast. COMPARISON:  Brain MRI 03/03/2018.  Head CT 11/14/2019. FINDINGS: Brain: Stable cerebral volume since last year. No midline shift, ventriculomegaly, mass effect, evidence of mass lesion, intracranial hemorrhage or evidence of cortically based acute infarction. Patchy  and confluent bilateral cerebral white matter hypodensity is stable. No cortical encephalomalacia identified. Vascular: Calcified atherosclerosis at the skull base. No suspicious intracranial vascular hyperdensity. Skull: Stable.  No acute osseous abnormality identified. Sinuses/Orbits: Chronic left mastoid effusion. Other Visualized paranasal sinuses and mastoids are stable and well pneumatized. Other: Stable orbit and scalp soft tissues. ASPECTS [facility] Stroke Program Early CT Score) Total score (0-10 with 10 being normal): 10 IMPRESSION: 1. Stable non contrast CT appearance of the brain. Advanced white matter disease. ASPECTS 10. 2. These results were communicated to Dr. Cheral Marker at 11:24 am on 02/14/2020 by text page via the [facility] messaging system. Electronically Signed   By: Genevie [name] M.D.   On: 02/14/2020 11:25    EKG: Independently reviewed.  Rapid A. Fib, LVH  Assessment/Plan Active Problems:   CVA (cerebral vascular accident) ([place])  (please populate well all problems here in Problem List. (For example, if patient is on BP meds at home and you resume or decide to hold them, it is a problem that needs to be her. Same for CAD, COPD, HLD and so on)  CVA -Reportedly has a right-sided paresis as well as left-sided facial droop -CT head and CTA negative for acute finding -Trial MRI but appears that patient may not be cooperative given her baseline dementia -Continue Savaysa and Statin -Speech, PT  A. fib with RVR -On  Cardizem drip -Increase metoprolol to 75 mg BID -Check digoxin level and then decide whether to reload Digoxin -Savaysa -Check TSH and T4  HTN -Hard to enforce permissive hypertension given that the rapid A. fib needs to be treated as well.  HLD -Statin  Anxiety depression -Continue SSRI  Seizure disorder -Continue Dilantin   DVT prophylaxis: Savaysa Code Status: Partial Code, no intubation or chest compression Family Communication: Son over phone Disposition Plan: Expect more than 2 midnight hospital stay. Consults called: Neuro Admission status: PCU for Cardizem drip.   Lequita Halt MD [facility] [phone]  02/14/2020, 1:54 PM

## 2020-02-14 NOTE — ED Provider Notes (Signed)
[facility] EMERGENCY DEPARTMENT Provider Note   CSN: [id number] Arrival date & time: 02/14/20  1107  An emergency department physician performed an initial assessment on this suspected stroke patient at 1110.  History No chief complaint on file.   [name] is a 87 y.o. female. Level 5 caveat due to some dementia. HPI Patient presented as a code stroke.  Reportedly had been normal at 10 AM this morning at 1005 found on the ground.  Reportedly had a fall.  Apparently had some bruising to back of head.  Is on Sayvasa for anticoagulation for atrial fibrillation.  Right-sided weakness.  Patient without complaints.  Denies chest pain.  Denies neck pain.  Unsure of exactly what caused the fall.  Met at bridge upon arrival by myself an Dr. Cheral Marker from neurology.  Reportedly finished up antibiotics for UTI yesterday.    Past Medical History:  Diagnosis Date  . Anemia   . Atrial fibrillation ([place])   . Calf pain    September, 2012, at rest  . Carotid bruit    Doppler, December, 2009, no abnormality  . Cataract   . Diverticulosis   . GERD (gastroesophageal reflux disease)   . History of shingles   . HLD (hyperlipidemia)   . HTN (hypertension)   . Hypothyroidism   . Insomnia   . Osteoarthritis   . PONV (postoperative nausea and vomiting)   . Rectal bleeding 2001   diverticulosis and int hemorrhoids on 07/1999 and 02/2010 colonoscopies.  . Seizures ([place])   . Stroke ([place])   . Warfarin anticoagulation     Patient Active Problem List   Diagnosis Date Noted  . Seizure ([place]) 03/02/2018  . Left-sided weakness 03/01/2018  . Aphasia 01/30/2018  . History of Clostridioides difficile colitis 12/23/2017  . Mild dementia ([place]) 12/23/2017  . History of TIA (transient ischemic attack) 03/15/2017  . Osteoporosis 02/24/2017  . Prolonged QT interval 01/28/2017  . Unstable angina ([place]) 06/16/2016  . Atrial flutter ([place]) 06/16/2016  . Functional urinary incontinence  07/15/2015  . Hypo-osmolality and hyponatremia 07/15/2015  . Overactive bladder 05/02/2015  . History of CVA (cerebrovascular accident) 04/21/2015  . History of dysarthria 02/20/2015  . HTN (hypertension) 01/11/2015  . Hyponatremia 01/11/2015  . Chronic pain syndrome 11/25/2014  . Insomnia 11/25/2014  . Chronic diastolic CHF (congestive heart failure) ([place]) 11/12/2014  . Paroxysmal atrial fibrillation ([place]) 10/14/2014  . Dyslipidemia 09/10/2014  . Long term (current) use of anticoagulants 02/05/2014  . Hemorrhoids 07/16/2013  . Irritable bowel syndrome (IBS) 06/19/2013  . Hypothyroidism   . Carotid bruit   . S/P knee replacement   . DIVERTICULOSIS-COLON 01/19/2010    Past Surgical History:  Procedure Laterality Date  . BIOPSY  09/29/2015   Procedure: BIOPSY;  Surgeon: Rogene Houston, MD;  [facility];  Service: Endoscopy;;  gastric  . CATARACT EXTRACTION W/PHACO Right 04/15/2014   Procedure: CATARACT EXTRACTION PHACO AND INTRAOCULAR LENS PLACEMENT (IOC);  Surgeon: Tonny Branch, MD;  Location: [facility];  Service: Ophthalmology;  Laterality: Right;  CDE: 13.30  . CATARACT EXTRACTION W/PHACO Left 05/13/2014   Procedure: CATARACT EXTRACTION PHACO AND INTRAOCULAR LENS PLACEMENT LEFT EYE;  Surgeon: Tonny Branch, MD;  Location: [facility];  Service: Ophthalmology;  Laterality: Left;  CDE:13.00  . CHOLECYSTECTOMY N/A 01/15/2015   Procedure: LAPAROSCOPIC CHOLECYSTECTOMY;  Surgeon: Ralene Ok, MD;  Location: [facility];  Service: General;  Laterality: N/A;  . COLONOSCOPY N/A 09/29/2015   Procedure: COLONOSCOPY;  Surgeon: Mechele Dawley  Laural Golden, MD;  [facility];  Service: Endoscopy;  Laterality: N/A;  . COLONOSCOPY    . ESOPHAGOGASTRODUODENOSCOPY N/A 01/13/2015   Procedure: ESOPHAGOGASTRODUODENOSCOPY (EGD);  Surgeon: Jerene Bears, MD;  Location: [facility] ENDOSCOPY;  Service: Endoscopy;  Laterality: N/A;  . ESOPHAGOGASTRODUODENOSCOPY N/A 09/29/2015   Procedure: ESOPHAGOGASTRODUODENOSCOPY (EGD);   Surgeon: Rogene Houston, MD;  [facility];  Service: Endoscopy;  Laterality: N/A;  12:15  . GALLBLADDER SURGERY    . LEFT HEART CATH AND CORONARY ANGIOGRAPHY N/A 06/22/2016   Procedure: Left Heart Cath and Coronary Angiography;  Surgeon: Troy Sine, MD;  Location: [facility] CV LAB;  Service: Cardiovascular;  Laterality: N/A;  . SHOULDER OPEN ROTATOR CUFF REPAIR     bilateral  . TOTAL KNEE ARTHROPLASTY  05/26/10   right  . TOTAL KNEE ARTHROPLASTY  11/17/2010   Procedure: TOTAL KNEE ARTHROPLASTY;  Surgeon: Mauri Pole;  Location: [facility];  Service: Orthopedics;  Laterality: Left;  . TOTAL KNEE ARTHROPLASTY     Right  . TOTAL SHOULDER REPLACEMENT  01/2010   left     OB History    Gravida  1   Para  1   Term  1   Preterm      AB      Living        SAB      IAB      Ectopic      Multiple      Live Births              Family History  Problem Relation Age of Onset  . COPD Brother   . Atrial fibrillation Brother   . Diabetes Brother   . Liver cancer Brother        Alcohol-related  . Colon cancer Brother   . Heart attack Father   . Early death Father   . Heart disease Father   . Rheum arthritis Mother   . Arthritis Mother   . Heart disease Mother   . Coronary artery disease Brother   . Heart attack Daughter   . Gallbladder disease Brother     Social History   Tobacco Use  . Smoking status: Never Smoker  . Smokeless tobacco: Never Used  Vaping Use  . Vaping Use: Never used  Substance Use Topics  . Alcohol use: No  . Drug use: No    Home Medications Prior to Admission medications   Medication Sig Start Date End Date Taking? Authorizing Provider  Acetaminophen (TYLENOL) 325 MG CAPS Take 650 mg by mouth every 6 (six) hours as needed for mild pain.    [provider]  ARTIFICIAL TEAR OP Apply 2 drops to eye daily as needed ([place]). To the left eye    [provider]  atorvastatin (LIPITOR) 20 MG tablet Take 1  tablet (20 mg total) by mouth every other day. 09/14/19   Dettinger, Fransisca Kaufmann, MD  busPIRone (BUSPAR) 10 MG tablet Take 1 tablet (10 mg total) by mouth 2 (two) times daily. 10/24/19   Dettinger, Fransisca Kaufmann, MD  calcium carbonate (OS-CAL) 1250 (500 Ca) MG chewable tablet Chew 1 tablet by mouth daily.    [provider]  clotrimazole-betamethasone (LOTRISONE) cream Apply topically 2 times daily to affected areas until rash clears Patient taking differently: Apply 1 application topically 2 (two) times daily.  01/22/19   Dettinger, Fransisca Kaufmann, MD  digoxin (LANOXIN) 0.125 MG tablet Take 0.5 tablets (0.0625 mg total) by mouth  daily. 04/12/19   Minus Breeding, MD  diltiazem (CARDIZEM CD) 240 MG 24 hr capsule TAKE (1) CAPSULE DAILY Patient taking differently: Take 240 mg by mouth daily.  06/07/19   Minus Breeding, MD  donepezil (ARICEPT) 5 MG tablet TAKE ONE TABLET AT BEDTIME FOR MEMORY 08/08/19   Cameron Sprang, MD  GNP MELATONIN MAXIMUM STRENGTH 5 MG TABS TAKE ONE TABLET AT BEDTIME 10/24/19   Dettinger, Fransisca Kaufmann, MD  hydrALAZINE (APRESOLINE) 10 MG tablet TAKE 1 TABLET 2 TIMES A DAY 10/24/19   Dettinger, Fransisca Kaufmann, MD  hydrocortisone (ANUSOL-HC) 25 MG suppository Place 1 suppository (25 mg total) rectally 2 (two) times daily. 08/16/19   Dettinger, Fransisca Kaufmann, MD  levothyroxine (SYNTHROID) 75 MCG tablet TAKE 1 TABLET ONCE DAILY 09/21/19   Dettinger, Fransisca Kaufmann, MD  loperamide (IMODIUM) 2 MG capsule TAKE (1) CAPSULE FOUR TIMES DAILY AS NEEDED FOR DIARRHEA OR LOOSE STOOLS Patient taking differently: Take 2 mg by mouth 4 (four) times daily as needed.  04/11/19   Dettinger, Fransisca Kaufmann, MD  metoprolol succinate (TOPROL-XL) 100 MG 24 hr tablet TAKE 1 TABLET EVERY DAY FOLLOWING A MEAL Patient taking differently: Take 100 mg by mouth daily.  08/30/19   Dettinger, Fransisca Kaufmann, MD  Multiple Vitamin (MULTIVITAMIN WITH MINERALS) TABS tablet Take 1 tablet by mouth every morning.    [provider]  phenylephrine  (HEMORRHOIDAL) 0.25 % suppository Place 1 suppository rectally 2 (two) times daily. 08/31/19   Dettinger, Fransisca Kaufmann, MD  phenytoin (DILANTIN) 100 MG ER capsule Take 1 capsule (100 mg total) by mouth 3 (three) times daily. 08/08/19   Cameron Sprang, MD  potassium chloride SA (KLOR-CON) 20 MEQ tablet Take 1 tablet (20 mEq total) by mouth daily. 11/14/19   Sherwood Gambler, MD  SAVAYSA 30 MG TABS tablet TAKE 1 TABLET ONCE A DAY FOR AFIB Patient taking differently: Take 30 mg by mouth daily.  10/25/19   Minus Breeding, MD  sertraline (ZOLOFT) 50 MG tablet Take 1 tablet (50 mg total) by mouth daily. 09/14/19   Dettinger, Fransisca Kaufmann, MD  tamsulosin (FLOMAX) 0.4 MG CAPS capsule TAKE (1) CAPSULE DAILY Patient taking differently: Take 0.4 mg by mouth daily.  08/30/19   Dettinger, Fransisca Kaufmann, MD  traZODone (DESYREL) 50 MG tablet TAKE 1/2 TO 1 TABLET AT BEDTIME AS NEEDED FOR SLEEP Patient taking differently: Take 50 mg by mouth at bedtime.  10/25/19   Dettinger, Fransisca Kaufmann, MD    Allergies    Penicillins  Review of Systems   Review of Systems  Unable to perform ROS: Dementia    Physical Exam Updated Vital Signs BP (!) 121/101   Pulse (!) 139   Temp (!) 97.3 F (36.3 C) (Oral)   Resp 17   SpO2 98%   Physical Exam Vitals and nursing note reviewed.  Constitutional:      Appearance: Normal appearance.  HENT:     Head:     Comments: No hematoma felt but reportedly had small occipital hematoma.    Nose: Nose normal.  Eyes:     Extraocular Movements: Extraocular movements intact.  Cardiovascular:     Rate and Rhythm: Tachycardia present. Rhythm irregular.  Pulmonary:     Breath sounds: No wheezing or rhonchi.  Abdominal:     Tenderness: There is no abdominal tenderness.  Musculoskeletal:        General: No tenderness.     Cervical back: Neck supple.     Comments: No midline spinal tenderness.  Cervical collar in place.  No tenderness over thorax or extremities.  Skin:    General: Skin is warm.      Capillary Refill: Capillary refill takes less than 2 seconds.  Neurological:     Mental Status: She is alert.     Comments: Complete NIH scoring done by neurology.  Weakness on right upper extremity and right lower extremity compared to left.  Does have some drift.  Awake and answers questions with some mild confusion.     ED Results / Procedures / Treatments   Labs (all labs ordered are listed, but only abnormal results are displayed) Labs Reviewed  CBC - Abnormal; Notable for the following components:      Result Value   WBC 11.3 (*)    Platelets 414 (*)    All other components within normal limits  DIFFERENTIAL - Abnormal; Notable for the following components:   Neutro Abs 8.7 (*)    Monocytes Absolute 1.2 (*)    Abs Immature Granulocytes 0.08 (*)    All other components within normal limits  COMPREHENSIVE METABOLIC PANEL - Abnormal; Notable for the following components:   Sodium 132 (*)    Chloride 96 (*)    Glucose, Bld 143 (*)    BUN 25 (*)    Creatinine, Ser 1.01 (*)    Calcium 8.4 (*)    Total Protein 6.1 (*)    Albumin 3.1 (*)    GFR, Estimated 55 (*)    All other components within normal limits  PHENYTOIN LEVEL, TOTAL - Abnormal; Notable for the following components:   Phenytoin Lvl <2.5 (*)    All other components within normal limits  CBG MONITORING, ED - Abnormal; Notable for the following components:   Glucose-Capillary 148 (*)    All other components within normal limits  I-STAT CHEM 8, ED - Abnormal; Notable for the following components:   Sodium 132 (*)    Chloride 95 (*)    BUN 28 (*)    Glucose, Bld 144 (*)    Calcium, Ion 0.88 (*)    All other components within normal limits  RESP PANEL BY RT-PCR (FLU A&B, COVID) ARPGX2  ETHANOL  PROTIME-INR  APTT  RAPID URINE DRUG SCREEN, HOSP PERFORMED  URINALYSIS, ROUTINE W REFLEX MICROSCOPIC  DIGOXIN LEVEL    EKG EKG Interpretation  Date/Time:  Thursday February 14 2020 11:44:27 EST Ventricular  Rate:  140 PR Interval:    QRS Duration: 92 QT Interval:  356 QTC Calculation: 544 R Axis:   -74 Text Interpretation: likely afib with RVR Paired ventricular premature complexes Left anterior fascicular block LVH with secondary repolarization abnormality Probable anterior infarct, age indeterminate Confirmed by Davonna Belling [phone]) on 02/14/2020 11:53:09 AM   Radiology CT Code Stroke CTA Head W/WO contrast  Result Date: 02/14/2020 CLINICAL DATA:  87 year old female code stroke presentation. Neurologic exam raise the possibility of left ACA or MCA territory insult. EXAM: CT ANGIOGRAPHY HEAD AND NECK TECHNIQUE: Multidetector CT imaging of the head and neck was performed using the standard protocol during bolus administration of intravenous contrast. Multiplanar CT image reconstructions and MIPs were obtained to evaluate the vascular anatomy. Carotid stenosis measurements (when applicable) are obtained utilizing NASCET criteria, using the distal internal carotid diameter as the denominator. CONTRAST:  150 mL OMNIPAQUE IOHEXOL 350 MG/ML SOLN COMPARISON:  Plain head CT 1119 hours today. CTA head and neck 03/01/2018. FINDINGS: The initial arterial bolus attempt was non-diagnostic due to streak artifact from left shoulder arthroplasty interfering  with the automatic contrast detection. But the repeat imaging attempt is of superior diagnostic quality. CTA NECK Skeleton: Chronically absent dentition. Cervical spine degeneration. Chronic left shoulder arthroplasty. No acute osseous abnormality identified. Upper chest: Small to moderate layering right pleural effusion. Otherwise stable, negative visible upper lungs. No superior mediastinal lymphadenopathy. Other neck: Stable.  No acute findings. Aortic arch: 3 vessel arch configuration. Chronic tortuosity of the thoracic aorta. Calcified arch atherosclerosis is stable. Right carotid system: Tortuous brachiocephalic artery and right CCA. Similar capacious right  carotid bifurcation. Mild calcified plaque at the bifurcation with no stenosis. Tortuous right ICA is patent to the skull base. Left carotid system: Similar generalized left carotid ectasia/tortuosity with minimal plaque and no stenosis. Vertebral arteries: Mild plaque and tortuosity of the proximal right subclavian artery without stenosis. Superior visualization of the right vertebral artery origin today, normal. Minimal right V1 calcified plaque without stenosis. Mild right vertebral tortuosity to the skull base. Mild plaque in the proximal left subclavian artery without stenosis. Normal left vertebral artery origin. Tortuous left vertebral is patent to the skull base without stenosis. CTA HEAD Posterior circulation: Patent and normal codominant distal vertebral arteries to the basilar. Patent PICA origins. Patent basilar artery without irregularity or stenosis. Normal SCA and PCA origins. Posterior communicating arteries are diminutive or absent. Bilateral PCA branches are within normal limits. Mildly tortuous left P1. Anterior circulation: Both ICA siphons are patent with mild tortuosity but minimal plaque and no stenosis. Patent carotid termini. Normal MCA and ACA origins. Mildly tortuous A1 segments are patent. Diminutive anterior communicating artery is normal. Bilateral ACA branches are stable and within normal limits. Left MCA M1 segment and trifurcation are patent without stenosis. Left MCA branches are stable and within normal limits. Right MCA M1, right MCA bifurcation, and right MCA branches are stable and within normal limits. Venous sinuses: Patent. Anatomic variants: None. Review of the MIP images confirms the above findings Preliminary results discussed by telephone with Dr. Kerney Elbe on 02/14/2020 at 1138 hours. IMPRESSION: 1. Negative for large vessel occlusion. Stable CTA head and neck since 2020, positive for generalized arterial tortuosity, but with minimal atherosclerosis for age and no  arterial stenosis. 2. Small to moderate layering right pleural effusion. 3. Aortic Atherosclerosis (ICD10-I70.0). Preliminary results discussed by telephone with Dr. Kerney Elbe on 02/14/2020 at 1138 hours. Electronically Signed   By: Genevie [name] M.D.   On: 02/14/2020 11:53   CT Code Stroke CTA Neck W/WO contrast  Result Date: 02/14/2020 CLINICAL DATA:  87 year old female code stroke presentation. Neurologic exam raise the possibility of left ACA or MCA territory insult. EXAM: CT ANGIOGRAPHY HEAD AND NECK TECHNIQUE: Multidetector CT imaging of the head and neck was performed using the standard protocol during bolus administration of intravenous contrast. Multiplanar CT image reconstructions and MIPs were obtained to evaluate the vascular anatomy. Carotid stenosis measurements (when applicable) are obtained utilizing NASCET criteria, using the distal internal carotid diameter as the denominator. CONTRAST:  150 mL OMNIPAQUE IOHEXOL 350 MG/ML SOLN COMPARISON:  Plain head CT 1119 hours today. CTA head and neck 03/01/2018. FINDINGS: The initial arterial bolus attempt was non-diagnostic due to streak artifact from left shoulder arthroplasty interfering with the automatic contrast detection. But the repeat imaging attempt is of superior diagnostic quality. CTA NECK Skeleton: Chronically absent dentition. Cervical spine degeneration. Chronic left shoulder arthroplasty. No acute osseous abnormality identified. Upper chest: Small to moderate layering right pleural effusion. Otherwise stable, negative visible upper lungs. No superior mediastinal lymphadenopathy. Other neck: Stable.  No acute findings. Aortic arch: 3 vessel arch configuration. Chronic tortuosity of the thoracic aorta. Calcified arch atherosclerosis is stable. Right carotid system: Tortuous brachiocephalic artery and right CCA. Similar capacious right carotid bifurcation. Mild calcified plaque at the bifurcation with no stenosis. Tortuous right ICA is patent to  the skull base. Left carotid system: Similar generalized left carotid ectasia/tortuosity with minimal plaque and no stenosis. Vertebral arteries: Mild plaque and tortuosity of the proximal right subclavian artery without stenosis. Superior visualization of the right vertebral artery origin today, normal. Minimal right V1 calcified plaque without stenosis. Mild right vertebral tortuosity to the skull base. Mild plaque in the proximal left subclavian artery without stenosis. Normal left vertebral artery origin. Tortuous left vertebral is patent to the skull base without stenosis. CTA HEAD Posterior circulation: Patent and normal codominant distal vertebral arteries to the basilar. Patent PICA origins. Patent basilar artery without irregularity or stenosis. Normal SCA and PCA origins. Posterior communicating arteries are diminutive or absent. Bilateral PCA branches are within normal limits. Mildly tortuous left P1. Anterior circulation: Both ICA siphons are patent with mild tortuosity but minimal plaque and no stenosis. Patent carotid termini. Normal MCA and ACA origins. Mildly tortuous A1 segments are patent. Diminutive anterior communicating artery is normal. Bilateral ACA branches are stable and within normal limits. Left MCA M1 segment and trifurcation are patent without stenosis. Left MCA branches are stable and within normal limits. Right MCA M1, right MCA bifurcation, and right MCA branches are stable and within normal limits. Venous sinuses: Patent. Anatomic variants: None. Review of the MIP images confirms the above findings Preliminary results discussed by telephone with Dr. Kerney Elbe on 02/14/2020 at 1138 hours. IMPRESSION: 1. Negative for large vessel occlusion. Stable CTA head and neck since 2020, positive for generalized arterial tortuosity, but with minimal atherosclerosis for age and no arterial stenosis. 2. Small to moderate layering right pleural effusion. 3. Aortic Atherosclerosis (ICD10-I70.0).  Preliminary results discussed by telephone with Dr. Kerney Elbe on 02/14/2020 at 1138 hours. Electronically Signed   By: Genevie [name] M.D.   On: 02/14/2020 11:53   CT Cervical Spine Wo Contrast  Result Date: 02/14/2020 CLINICAL DATA:  Code stroke.  87 year old female.  Neck trauma. EXAM: CT CERVICAL SPINE WITHOUT CONTRAST TECHNIQUE: Multidetector CT imaging of the cervical spine was performed without intravenous contrast. Multiplanar CT image reconstructions were also generated. COMPARISON:  CT cervical spine 02/18/2009. FINDINGS: Alignment: Improved cervical lordosis from 2011. Mild degenerative appearing anterolisthesis of C3 on C4, associated with chronic right side facet arthropathy. Similar anterolisthesis of C7 on T1 with bilateral facet hypertrophy. Skull base and vertebrae: Visualized skull base is intact. No atlanto-occipital dissociation. Osteopenia. C1-C2 appear intact, although with mild to moderate joint space loss on the right. Normal C1-C2 alignment for the degree of head rotation. No acute osseous abnormality identified. Soft tissues and spinal canal: No prevertebral fluid or swelling. No visible canal hematoma. Calcified carotid atherosclerosis in the neck. Disc levels: Widespread cervical spine degeneration but only mild cervical spinal stenosis suspected. Upper chest: Negative. Other: Head CT reported separately. IMPRESSION: 1. No acute traumatic injury identified in the cervical spine. 2. Osteopenia.  Progressed cervical spine degeneration since 2011. Electronically Signed   By: Genevie [name] M.D.   On: 02/14/2020 11:29   DG Chest Portable 1 View  Result Date: 02/14/2020 CLINICAL DATA:  Fall. EXAM: PORTABLE CHEST 1 VIEW COMPARISON:  March 04, 2018. FINDINGS: Stable cardiomegaly. No pneumothorax or pleural effusion is noted. Both lungs are clear. Status  post left total shoulder arthroplasty. Old right rib fractures are noted. IMPRESSION: No active disease. Electronically Signed   By: Marijo Conception M.D.   On: 02/14/2020 12:06   CT HEAD CODE STROKE WO CONTRAST  Result Date: 02/14/2020 CLINICAL DATA:  Code stroke.  87 year old female. EXAM: CT HEAD WITHOUT CONTRAST TECHNIQUE: Contiguous axial images were obtained from the base of the skull through the vertex without intravenous contrast. COMPARISON:  Brain MRI 03/03/2018.  Head CT 11/14/2019. FINDINGS: Brain: Stable cerebral volume since last year. No midline shift, ventriculomegaly, mass effect, evidence of mass lesion, intracranial hemorrhage or evidence of cortically based acute infarction. Patchy and confluent bilateral cerebral white matter hypodensity is stable. No cortical encephalomalacia identified. Vascular: Calcified atherosclerosis at the skull base. No suspicious intracranial vascular hyperdensity. Skull: Stable.  No acute osseous abnormality identified. Sinuses/Orbits: Chronic left mastoid effusion. Other Visualized paranasal sinuses and mastoids are stable and well pneumatized. Other: Stable orbit and scalp soft tissues. ASPECTS [facility] Stroke Program Early CT Score) Total score (0-10 with 10 being normal): 10 IMPRESSION: 1. Stable non contrast CT appearance of the brain. Advanced white matter disease. ASPECTS 10. 2. These results were communicated to Dr. Cheral Marker at 11:24 am on 02/14/2020 by text page via the [facility] messaging system. Electronically Signed   By: Genevie [name] M.D.   On: 02/14/2020 11:25    Procedures Procedures   Medications Ordered in ED Medications  diltiazem (CARDIZEM) 125 mg in dextrose 5% 125 mL (1 mg/mL) infusion (5 mg/hr Intravenous New Bag/Given 02/14/20 1226)  iohexol (OMNIPAQUE) 350 MG/ML injection 75 mL (75 mLs Intravenous Contrast Given 02/14/20 1132)    ED Course  I have reviewed the triage vital signs and the nursing notes.  Pertinent labs & imaging results that were available during my care of the patient were reviewed by me and considered in my medical decision making (see chart for details).    MDM  Rules/Calculators/A&P                          Chadsvasc 6.  Patient presented as a code stroke.  Had right-sided weakness after a fall.  Last normal at 10:00 and found to 1005.  Head CT and CTA reassuring.  Not a TPA candidate due to the presence of anticoagulation and no LVO.  However will require admission the hospital.  However does have likely stroke but since did fall and have injury we need to evaluate for cervical spine injury.  No tenderness and CT is reassuring, but likely will need MRI to further evaluate cervical spine. Also found to be in A. fib with RVR.  History of same.  Will start Cardizem drip for rate control.  Admit to hospital  CRITICAL CARE Performed by: Davonna Belling Total critical care time: 30 minutes Critical care time was exclusive of separately billable procedures and treating other patients. Critical care was necessary to treat or prevent imminent or life-threatening deterioration. Critical care was time spent personally by me on the following activities: development of treatment plan with patient and/or surrogate as well as nursing, discussions with consultants, evaluation of patient's response to treatment, examination of patient, obtaining history from patient or surrogate, ordering and performing treatments and interventions, ordering and review of laboratory studies, ordering and review of radiographic studies, pulse oximetry and re-evaluation of patient's condition.   Final Clinical Impression(s) / ED Diagnoses Final diagnoses:  Cerebrovascular accident (CVA), unspecified mechanism ([place])  Atrial fibrillation with rapid  ventricular response ([place])  Fall, initial encounter    Rx / DC Orders ED Discharge Orders    None       Davonna Belling, MD 02/14/20 1241

## 2020-02-14 NOTE — ED Triage Notes (Signed)
Pt here from nursing home as a code stroke after a unwitnessed fall hitting the back of the head , ems found pt to have right side weakness , pt is on a blood thinners and hx of afib

## 2020-02-14 NOTE — Consult Note (Signed)
Neurology Code Stroke Consult H&P  CC: CODE STROKE  History is obtained from: EMS, chart  HPI: [name] is an 87 y.o. female with a PMHx of dementia, AF on AC, HTN, seizure disorder, remote CVA, HLD, and hypothyroidism. Patient presents by EMS from ALF as a code stroke. Per EMS and staff at facility, patient suffered an unwitnessed fall around 1005 hrs. Patient was found on the floor by staff. LKW 1000 hrs. No seizure activity noted by staff or EMS. Cspine hard collar placed by EMS secondary to fall. Patient apparently hit the back of her head during fall.   After labs and brief assessment in ED, patient was taken to CT.   CBG in field 164, BP 148/92, HR 66. SaO2 normal on RA. Patient was oriented to self and event (fall) only.   + personal history of CVA. No history of CVA listed in FMHx.   In review of chart, patient was seen in ED on 11/14/2019 for syncope vs seizure. Unsure as to when patient was placed on Dilantin. Hypokalemia was found and patient d/c after treatment. Per notes, patient was A&O to person, place, and time at that event and on PCP notes from 2021. No further chart notes in our system since 2021. Patient was likely admitted to ALF at some point after that.   NP found that patient was on Edoxaban documented on cardiology note on 10/2019. Prior to that, Eliquis and unsure why this was changed.   On DOAC, statin, BP meds, and rate control meds. No history of smoking.   LKW: 1000 hours tpa given?: No, patient on DOAC IR Thrombectomy? No, no LVO. mRS-3  NIHSS:  1a Level of Conscious: 0 1b LOC Questions: 2 1c LOC Commands: 1 2 Best Gaze: 0 3 Visual: 0 4 Facial Palsy: 0 5a Motor Arm - left: 0 5b Motor Arm - Right: 2 6a Motor Leg - Left: 2 6b Motor Leg - Right: 3 7 Limb Ataxia: 0 8 Sensory: 1 9 Best Language: 1 10 Dysarthria: 0 11 Extinct. and Inatten: 1 TOTAL: 13  ROS: Patient states her head hurts in posterior area and had pain at middle of back. No n/v.  Rest of ROS unable to be completed due to patient's dementia.   Past Medical History:  Diagnosis Date  . Anemia   . Atrial fibrillation ([place])   . Calf pain    September, 2012, at rest  . Carotid bruit    Doppler, December, 2009, no abnormality  . Cataract   . Diverticulosis   . GERD (gastroesophageal reflux disease)   . History of shingles   . HLD (hyperlipidemia)   . HTN (hypertension)   . Hypothyroidism   . Insomnia   . Osteoarthritis   . PONV (postoperative nausea and vomiting)   . Rectal bleeding 2001   diverticulosis and int hemorrhoids on 07/1999 and 02/2010 colonoscopies.  . Seizures ([place])   . Stroke ([place])   . Warfarin anticoagulation   No longer on Warfarin, now on DOAC.    Family History  Problem Relation Age of Onset  . COPD Brother   . Atrial fibrillation Brother   . Diabetes Brother   . Liver cancer Brother        Alcohol-related  . Colon cancer Brother   . Heart attack Father   . Early death Father   . Heart disease Father   . Rheum arthritis Mother   . Arthritis Mother   . Heart disease Mother   .  Coronary artery disease Brother   . Heart attack Daughter   . Gallbladder disease Brother     Social History:  reports that she has never smoked. She has never used smokeless tobacco. She reports that she does not drink alcohol and does not use drugs.   Prior to Admission medications   Medication Sig Start Date End Date Taking? Authorizing Provider  Acetaminophen (TYLENOL) 325 MG CAPS Take 650 mg by mouth every 6 (six) hours as needed for mild pain.    [provider]  ARTIFICIAL TEAR OP Apply 2 drops to eye daily as needed ([place]). To the left eye    [provider]  atorvastatin (LIPITOR) 20 MG tablet Take 1 tablet (20 mg total) by mouth every other day. 09/14/19   Dettinger, Fransisca Kaufmann, MD  busPIRone (BUSPAR) 10 MG tablet Take 1 tablet (10 mg total) by mouth 2 (two) times daily. 10/24/19   Dettinger, Fransisca Kaufmann, MD  calcium carbonate  (OS-CAL) 1250 (500 Ca) MG chewable tablet Chew 1 tablet by mouth daily.    [provider]  clotrimazole-betamethasone (LOTRISONE) cream Apply topically 2 times daily to affected areas until rash clears Patient taking differently: Apply 1 application topically 2 (two) times daily.  01/22/19   Dettinger, Fransisca Kaufmann, MD  digoxin (LANOXIN) 0.125 MG tablet Take 0.5 tablets (0.0625 mg total) by mouth daily. 04/12/19   Minus Breeding, MD  diltiazem (CARDIZEM CD) 240 MG 24 hr capsule TAKE (1) CAPSULE DAILY Patient taking differently: Take 240 mg by mouth daily.  06/07/19   Minus Breeding, MD  donepezil (ARICEPT) 5 MG tablet TAKE ONE TABLET AT BEDTIME FOR MEMORY 08/08/19   Cameron Sprang, MD  GNP MELATONIN MAXIMUM STRENGTH 5 MG TABS TAKE ONE TABLET AT BEDTIME 10/24/19   Dettinger, Fransisca Kaufmann, MD  hydrALAZINE (APRESOLINE) 10 MG tablet TAKE 1 TABLET 2 TIMES A DAY 10/24/19   Dettinger, Fransisca Kaufmann, MD  hydrocortisone (ANUSOL-HC) 25 MG suppository Place 1 suppository (25 mg total) rectally 2 (two) times daily. 08/16/19   Dettinger, Fransisca Kaufmann, MD  levothyroxine (SYNTHROID) 75 MCG tablet TAKE 1 TABLET ONCE DAILY 09/21/19   Dettinger, Fransisca Kaufmann, MD  loperamide (IMODIUM) 2 MG capsule TAKE (1) CAPSULE FOUR TIMES DAILY AS NEEDED FOR DIARRHEA OR LOOSE STOOLS Patient taking differently: Take 2 mg by mouth 4 (four) times daily as needed.  04/11/19   Dettinger, Fransisca Kaufmann, MD  metoprolol succinate (TOPROL-XL) 100 MG 24 hr tablet TAKE 1 TABLET EVERY DAY FOLLOWING A MEAL Patient taking differently: Take 100 mg by mouth daily.  08/30/19   Dettinger, Fransisca Kaufmann, MD  Multiple Vitamin (MULTIVITAMIN WITH MINERALS) TABS tablet Take 1 tablet by mouth every morning.    [provider]  phenylephrine (HEMORRHOIDAL) 0.25 % suppository Place 1 suppository rectally 2 (two) times daily. 08/31/19   Dettinger, Fransisca Kaufmann, MD  phenytoin (DILANTIN) 100 MG ER capsule Take 1 capsule (100 mg total) by mouth 3 (three) times daily. 08/08/19   Cameron Sprang, MD  potassium chloride SA (KLOR-CON) 20 MEQ tablet Take 1 tablet (20 mEq total) by mouth daily. 11/14/19   Sherwood Gambler, MD  SAVAYSA 30 MG TABS tablet TAKE 1 TABLET ONCE A DAY FOR AFIB Patient taking differently: Take 30 mg by mouth daily.  10/25/19   Minus Breeding, MD  sertraline (ZOLOFT) 50 MG tablet Take 1 tablet (50 mg total) by mouth daily. 09/14/19   Dettinger, Fransisca Kaufmann, MD  tamsulosin (FLOMAX) 0.4 MG CAPS capsule TAKE (1)  CAPSULE DAILY Patient taking differently: Take 0.4 mg by mouth daily.  08/30/19   Dettinger, Fransisca Kaufmann, MD  traZODone (DESYREL) 50 MG tablet TAKE 1/2 TO 1 TABLET AT BEDTIME AS NEEDED FOR SLEEP Patient taking differently: Take 50 mg by mouth at bedtime.  10/25/19   Dettinger, Fransisca Kaufmann, MD    Exam: Current vital signs: Last BP 121/101, T 97.48F, RR 17, SaO2 98.   Physical Exam  Constitutional: Appears well-developed and well-nourished.  Psych: Affect appropriate to situation Eyes: No scleral injection HENT: No OP obstrucion. C-collar in place.  Head: Normocephalic.  Cardiovascular: tachycardic Respiratory: Effort normal. SaO2 GI: Soft.  Skin: WDI  Neuro: Mental Status: Patient is awake, alert, oriented to self only on initial assessment. On follow up assessment was oriented to month, but not year, day of week, city or state.  Patient is unable to give a clear and coherent history. She knows she fell, but cannot relate any other history. No signs of aphasia or neglect. Speech/Language: Speech is intact and fluent. No dysarthria. Comprehension is variable. Slow to respond to most commands. Will not repeat sentences. Naming intact, but slowed.   Cranial Nerves: II: Visual Fields are full. Pupils are equal, round, and reactive to light. III,IV, VI: EOMI without ptosis or diplopia.  V: Facial sensation is symmetric to light touch. Able to move jaw back and forth.  VII: Smile is symmetrical.  VIII: hearing is intact to voice IX, X: Uvula elevates  symmetrically. Phonation normal.  XI: Shoulder shrug is symmetric. XII: tongue is midline without atrophy or fasciculations.  Motor: Tone is normal. Bulk is normal.4-/5 strength to right grip, biceps, and triceps. 5/5 strength to LUE. Does not follow commands for BLE exam.  Sensory: Sensation is decreased to light touch and temperature in the RUE/RLE. Extinction abnormal to right UE/LE. Normal on left.  Plantars: Toes are downgoing bilaterally. Babinski negative.  Cerebellar: FNF is intact bilaterally. Drift noted right UE and no effort against gravity in RLE.    Gait: deferred.   I have reviewed labs in epic and the pertinent results are:  INR   1.1     LDL 64 in 2020     TSH 3.580 in 2021       HbA1c 5.9 in 2020  Imaging:   CT head  Stable non contrast CT appearance of the brain. Advanced white matter disease. ASPECTS 10.  CTA head and neck  1. Negative for large vessel occlusion. Stable CTA head and neck since 2020, positive for generalized arterial tortuosity, but with minimal atherosclerosis for age and no arterial stenosis. 2. Small to moderate layering right pleural effusion. 3. Aortic Atherosclerosis (ICD10-I70.0).  Assessment: 87 yo female with atrial fibrillation on anticoagulation and remote cerebral infarct, who presents to ED via EMS as a code stroke. She had an unwitnessed fall at ALF and was observed to be altered 5 minutes later. She had no seizure activity on site or while en route.  - Right sided weakness noted on exam, as well as mild expressive and receptive aphasia. Her dementia interferes with exam and provision of history.  - CT head is stable from 2020.  - IV tPA was contraindicated due to her being on a DOAC for AF. - Thrombectomy not indicated as no LVO seen on CTA.  - Overall presentation is most consistent with acute ischemic stroke  Recommendations: - HgbA1c,TSH, fasting lipid panel - Continue home Lipitor  - MRI brain without contrast.  -  Frequent neuro  checks - Follow NIHSS - Echocardiogram - Continue DOAC - edoxaban.  - Permissive HTN with modified parameters of 180/100 for 24 hours (advanced age), then normalize. Hold BP meds, but can continue rate control medicines during the permissive HTN time interval.  - Risk factor modification - Cardiac telemetry monitoring for arrhythmia - Infectious workup including UA, LA, CBC with differential, CXR.   - Fall injury workup already ordered by ED MD.  - PT consult, OT consult, Speech consult - Case management to evaluate if patient is still appropriate for ALF.  - Stroke education - Stroke team to follow  This patient is critically ill and at significant risk of neurological worsening, death and care requires constant monitoring of vital signs, hemodynamics,respiratory and cardiac monitoring, neurological assessment, discussion with family, other specialists and medical decision making of high complexity. I spent 40 minutes of neurocritical care time  in the care of  this patient. This was time spent independent of any time provided by nurse practitioner or PA.   Pt seen by Clance Boll, NP and MD.  Pager: [phone]  I have seen and examined the patient. My exam findings were observed and documented by Clance Boll, NP Electronically signed: Dr. Kerney Elbe

## 2020-02-14 NOTE — Code Documentation (Signed)
Patient from [facility] where she requires assistance dressing, bathing and eating. She uses a walker for mobility but according to family forgets to take it with her a lot of the time. Patient has a hx of dementia and Afib (on Edoxaban). She was LKW at 1000 and then found at 1005 after a fall. Patient did hit her head on the fall. [facility] arrived and code stroke activated after noticing right-sided weakness. Patient usually Alert to name and place at baseline. Patient taken to [facility] and met by the stroke team and EDP. She was cleared for CT and taken for CT/CTA head and neck. NIHSS 12 (see documentation). Patient is not a candidate for TPA r/t contraindicated while on Edoxaban. CT/CTA results below. Negative for LVO according to MD. Care Plan: q2x12 then q4 mNIHSS/vitals. Hand off with [place], Therapist, sports.   "IMPRESSION: 1. Negative for large vessel occlusion. Stable CTA head and neck since 2020, positive for generalized arterial tortuosity, but with minimal atherosclerosis for age and no arterial stenosis."   "1. Stable non contrast CT appearance of the brain. Advanced white matter disease. ASPECTS 10."   Lourene Hoston, Rande Brunt, RN

## 2020-02-14 NOTE — Progress Notes (Addendum)
Pt arrived on unit by ED nurse. bed is locked in the lowest position with call bell within reach.

## 2020-02-14 NOTE — ED Notes (Signed)
Attempted report, call back number left.

## 2020-02-15 ENCOUNTER — Inpatient Hospital Stay (HOSPITAL_COMMUNITY): Payer: Medicare Other

## 2020-02-15 DIAGNOSIS — I10 Essential (primary) hypertension: Secondary | ICD-10-CM

## 2020-02-15 DIAGNOSIS — I6389 Other cerebral infarction: Secondary | ICD-10-CM | POA: Diagnosis not present

## 2020-02-15 DIAGNOSIS — I6340 Cerebral infarction due to embolism of unspecified cerebral artery: Secondary | ICD-10-CM

## 2020-02-15 DIAGNOSIS — R569 Unspecified convulsions: Secondary | ICD-10-CM

## 2020-02-15 DIAGNOSIS — I4891 Unspecified atrial fibrillation: Secondary | ICD-10-CM | POA: Diagnosis not present

## 2020-02-15 DIAGNOSIS — I639 Cerebral infarction, unspecified: Secondary | ICD-10-CM | POA: Diagnosis not present

## 2020-02-15 LAB — BASIC METABOLIC PANEL
Anion gap: 18 — ABNORMAL HIGH (ref 5–15)
Anion gap: 12 (ref 5–15)
BUN: 20 mg/dL (ref 8–23)
BUN: 21 mg/dL (ref 8–23)
CO2: 17 mmol/L — ABNORMAL LOW (ref 22–32)
CO2: 20 mmol/L — ABNORMAL LOW (ref 22–32)
Calcium: 8.7 mg/dL — ABNORMAL LOW (ref 8.9–10.3)
Calcium: 8.4 mg/dL — ABNORMAL LOW (ref 8.9–10.3)
Chloride: 98 mmol/L (ref 98–111)
Chloride: 103 mmol/L (ref 98–111)
Creatinine, Ser: 0.69 mg/dL (ref 0.44–1.00)
Creatinine, Ser: 0.78 mg/dL (ref 0.44–1.00)
GFR, Estimated: >60 mL/min (ref >60)
GFR, Estimated: >60 mL/min (ref >60)
Glucose, Bld: 136 mg/dL — ABNORMAL HIGH (ref 70–99)
Glucose, Bld: 128 mg/dL — ABNORMAL HIGH (ref 70–99)
Potassium: 5.2 mmol/L — ABNORMAL HIGH (ref 3.5–5.1)
Potassium: 4.3 mmol/L (ref 3.5–5.1)
Sodium: 133 mmol/L — ABNORMAL LOW (ref 135–145)
Sodium: 135 mmol/L (ref 135–145)

## 2020-02-15 LAB — LIPID PANEL
Cholesterol: 185 mg/dL (ref 0–200)
HDL: 58 mg/dL (ref >40)
LDL Cholesterol: 101 mg/dL — ABNORMAL HIGH (ref 0–99)
Total CHOL/HDL Ratio: 3.2 RATIO
Triglycerides: 130 mg/dL (ref <150)
VLDL: 26 mg/dL (ref 0–40)

## 2020-02-15 LAB — ECHOCARDIOGRAM COMPLETE
S' Lateral: 3.1 cm
Weight: 1760 oz

## 2020-02-15 LAB — HEMOGLOBIN A1C
Hgb A1c MFr Bld: 5.6 % (ref 4.8–5.6)
Mean Plasma Glucose: 114.02 mg/dL

## 2020-02-15 MED ORDER — PERFLUTREN LIPID MICROSPHERE
1.0000 mL | INTRAVENOUS | Status: AC | PRN
  Administered 2020-02-15: 4 mL via INTRAVENOUS
  Filled 2020-02-15: qty 10

## 2020-02-15 MED ORDER — TRAZODONE HCL 50 MG PO TABS
25.0000 mg | ORAL_TABLET | Freq: Every day | ORAL | Status: DC
  Administered 2020-02-15 – 2020-02-18 (×4): 25 mg via ORAL
  Filled 2020-02-15 (×4): qty 1

## 2020-02-15 MED ORDER — PHENYTOIN SODIUM 50 MG/ML IJ SOLN
100.0000 mg | Freq: Three times a day (TID) | INTRAMUSCULAR | Status: DC
  Administered 2020-02-15 (×2): 100 mg via INTRAVENOUS
  Filled 2020-02-15 (×2): qty 2

## 2020-02-15 MED ORDER — SODIUM POLYSTYRENE SULFONATE 15 GM/60ML PO SUSP
15.0000 g | Freq: Once | ORAL | Status: DC
  Filled 2020-02-15: qty 60

## 2020-02-15 MED ORDER — SODIUM CHLORIDE 0.9 % IV SOLN: INTRAVENOUS | Status: AC

## 2020-02-15 MED ORDER — METOPROLOL TARTRATE 5 MG/5ML IV SOLN
2.5000 mg | Freq: Three times a day (TID) | INTRAVENOUS | Status: DC
  Administered 2020-02-15 – 2020-02-16 (×4): 2.5 mg via INTRAVENOUS
  Filled 2020-02-15 (×3): qty 5

## 2020-02-15 MED ORDER — APIXABAN 2.5 MG PO TABS
2.5000 mg | ORAL_TABLET | Freq: Two times a day (BID) | ORAL | Status: DC
  Administered 2020-02-16 – 2020-02-19 (×7): 2.5 mg via ORAL
  Filled 2020-02-15 (×7): qty 1

## 2020-02-15 MED ORDER — WHITE PETROLATUM EX OINT
TOPICAL_OINTMENT | CUTANEOUS | Status: AC
  Filled 2020-02-15: qty 28.35

## 2020-02-15 MED ORDER — SODIUM CHLORIDE 0.9 % IV SOLN
100.0000 mg | Freq: Two times a day (BID) | INTRAVENOUS | Status: DC
  Administered 2020-02-15: 100 mg via INTRAVENOUS
  Filled 2020-02-15 (×3): qty 10

## 2020-02-15 MED ORDER — ROSUVASTATIN CALCIUM 20 MG PO TABS: 20.0000 mg | ORAL_TABLET | Freq: Every day | ORAL | Status: DC

## 2020-02-15 NOTE — NC FL2 (Signed)
[place] MEDICAID FL2 LEVEL OF CARE SCREENING TOOL     IDENTIFICATION  Patient Name: [name] Birthdate: 07-20-1932 Sex: female Admission Date (Current Location): 02/14/2020  [facility] and [place] Number:  Herbalist and Address:  [facility], [place] [address] [place],  [address]      Provider Number: [id number]  Attending Physician Name and Address:  Charlynne Cousins, MD  Relative Name and Phone Number:       Current Level of Care: Hospital Recommended Level of Care: [place] Prior Approval Number:    Date Approved/Denied:   PASRR Number:    Discharge Plan: SNF    Current Diagnoses: Patient Active Problem List   Diagnosis Date Noted  . Seizure ([employer]) 03/02/2018  . Left-sided weakness 03/01/2018  . Aphasia 01/30/2018  . CVA (cerebral vascular accident) ([place]) 01/30/2018  . History of Clostridioides difficile colitis 12/23/2017  . Mild dementia ([place]) 12/23/2017  . History of TIA (transient ischemic attack) 03/15/2017  . Osteoporosis 02/24/2017  . Prolonged QT interval 01/28/2017  . Unstable angina ([place]) 06/16/2016  . Atrial flutter ([place]) 06/16/2016  . Functional urinary incontinence 07/15/2015  . Hypo-osmolality and hyponatremia 07/15/2015  . Overactive bladder 05/02/2015  . History of CVA (cerebrovascular accident) 04/21/2015  . History of dysarthria 02/20/2015  . Essential hypertension 01/11/2015  . Hyponatremia 01/11/2015  . Chronic pain syndrome 11/25/2014  . Insomnia 11/25/2014  . Chronic diastolic CHF (congestive heart failure) ([place]) 11/12/2014  . Paroxysmal atrial fibrillation ([place]) 10/14/2014  . Dyslipidemia 09/10/2014  . Long term (current) use of anticoagulants 02/05/2014  . Hemorrhoids 07/16/2013  . Irritable bowel syndrome (IBS) 06/19/2013  . Atrial fibrillation with rapid ventricular response ([place])   . Hypothyroidism   . Carotid bruit   . S/P knee replacement   .  DIVERTICULOSIS-COLON 01/19/2010    Orientation RESPIRATION BLADDER Height & Weight     Self,Place  Normal Incontinent Weight: 110 lb (49.9 kg) Height:     BEHAVIORAL SYMPTOMS/MOOD NEUROLOGICAL BOWEL NUTRITION STATUS      Incontinent Diet (see DC summary)  AMBULATORY STATUS COMMUNICATION OF NEEDS Skin   Extensive Assist Verbally Normal                       Personal Care Assistance Level of Assistance  Bathing,Feeding,Dressing Bathing Assistance: Maximum assistance Feeding assistance: Maximum assistance Dressing Assistance: Maximum assistance     Functional Limitations Info  Speech     Speech Info: Impaired (delayed responses, incomprehensible)    SPECIAL CARE FACTORS FREQUENCY                       Contractures Contractures Info: Not present    Additional Factors Info  Code Status,Allergies,Psychotropic Code Status Info: Partial: No CPR, No intubation Allergies Info: Penicillins Psychotropic Info: Buspar 10mg  2x/day; Zoloft 50mg  daily         Current Medications (02/15/2020):  This is the current hospital active medication list Current Facility-Administered Medications  Medication Dose Route Frequency Provider Last Rate Last Admin  . 0.9 %  sodium chloride infusion   Intravenous Continuous Charlynne Cousins, MD 75 mL/hr at 02/15/20 1211 New Bag at 02/15/20 1211  . acetaminophen (TYLENOL) tablet 650 mg  650 mg Oral Q4H PRN Lequita Halt, MD       Or  . acetaminophen (TYLENOL) 160 MG/5ML solution 650 mg  650 mg Per Tube Q4H PRN Lequita Halt, MD  Or  . acetaminophen (TYLENOL) suppository 650 mg  650 mg Rectal Q4H PRN Wynetta Fines T, MD      . atorvastatin (LIPITOR) tablet 20 mg  20 mg Oral Tamsen Meek T, MD      . busPIRone (BUSPAR) tablet 10 mg  10 mg Oral BID Wynetta Fines T, MD      . calcium carbonate (OS-CAL - dosed in mg of elemental calcium) tablet 1,250 mg  1,250 mg Oral Q breakfast Wynetta Fines T, MD      . digoxin (LANOXIN) tablet  0.0625 mg  0.0625 mg Oral Daily Wynetta Fines T, MD      . diltiazem (CARDIZEM) 125 mg in dextrose 5% 125 mL (1 mg/mL) infusion  5-15 mg/hr Intravenous Continuous Davonna Belling, MD 15 mL/hr at 02/15/20 1213 15 mg/hr at 02/15/20 1213  . edoxaban (SAVAYSA) tablet 30 mg  30 mg Oral Q24H Wynetta Fines T, MD      . Derrill Memo ON 02/17/2020] levothyroxine (SYNTHROID, LEVOTHROID) injection 37.5 mcg  37.5 mcg Intravenous Daily Wynetta Fines T, MD      . LORazepam (ATIVAN) injection 0.5 mg  0.5 mg Intravenous Q6H PRN Wynetta Fines T, MD   0.5 mg at 02/15/20 1005  . metoprolol tartrate (LOPRESSOR) injection 2.5 mg  2.5 mg Intravenous Q8H Charlynne Cousins, MD   2.5 mg at 02/15/20 0857  . phenytoin (DILANTIN) injection 100 mg  100 mg Intravenous Q8H Charlynne Cousins, MD   100 mg at 02/15/20 1147  . senna-docusate (Senokot-S) tablet 1 tablet  1 tablet Oral QHS PRN Wynetta Fines T, MD      . sertraline (ZOLOFT) tablet 50 mg  50 mg Oral Daily Wynetta Fines T, MD      . sodium chloride flush (NS) 0.9 % injection 10-40 mL  10-40 mL Intracatheter Q12H Wynetta Fines T, MD   10 mL at 02/15/20 1126  . sodium chloride flush (NS) 0.9 % injection 10-40 mL  10-40 mL Intracatheter PRN Wynetta Fines T, MD      . sodium polystyrene (KAYEXALATE) 15 GM/60ML suspension 15 g  15 g Oral Once Aileen Fass, Tammi Klippel, MD      . tamsulosin [facility]) capsule 0.4 mg  0.4 mg Oral Daily Wynetta Fines T, MD      . traZODone (DESYREL) tablet 25 mg  25 mg Oral QHS Hammons, Theone Murdoch, [facility]         Discharge Medications: Please see discharge summary for a list of discharge medications.  Relevant Imaging Results:  Relevant Lab Results:   Additional Information SS#: [id number]  Geralynn Ochs, LCSW

## 2020-02-15 NOTE — Progress Notes (Signed)
[facility] PROGRESS NOTE    Progress Note  [name]  KYH:[id number] DOB: May 02, 1932 DOA: 02/14/2020 PCP: Dettinger, Fransisca Kaufmann, MD     Brief Narrative:   [name] is an 87 y.o. female past medical history significant for advanced dementia, chronic A. fib on systemic anticoagulation, hypothyroidism is brought in by EMS for strokelike symptoms.  In the ED she was found in A. fib started on Cardizem drip.  Significant Events: 02/14/2020 admission  Significant studies: 02/14/2020 CT of the head showed no acute findings. 02/14/2020 CTA was negative for large caliber occlusion  Antibiotics: None  Microbiology data: Blood culture:  Procedures: None  Assessment/Plan:   CVA: With right-sided weakness and left facial droop. HgbA1c, fasting lipid panel HDL greater than 40 LDL greater than 100 started on Crestor MRI, MRA of the brain without contrast pending PT, OT, Speech consult pending Transthoracic Echo,   pending Continue DOAC BP goal: permissive HTN upto 220/120 mmHg TSH 5.0 free T4 1.6 CBC shows a mild left shift Pending.  Chest x-ray shows no acute findings  A. fib with RVR: Only on IV diltiazem drip her heart rate is controlled, speech evaluation is pending we will continue IV diltiazem until she passes a swallowing evaluation.  Continue DOAC.  Chronic diastolic CHF (congestive heart failure) ([place]) Noted she appears euvolemic hold diuretic therapy.  And antihypertensive medications to allow permissive hypertension.  Essential hypertension: Well-controlled, continue diltiazem hold all other antihypertensive medication.  Seizure disorder Change Dilantin to IV as she is not had her swallowing evaluation.    DVT prophylaxis: DOAC Family Communication:daughter Status is: Inpatient  Remains inpatient appropriate because:Hemodynamically unstable   Dispo: The patient is from: SNF              Anticipated d/c is to: SNF              Anticipated d/c  date is: 2 days              Patient currently is not medically stable to d/c.   Difficult to place patient No Code Status:     Code Status Orders  (From admission, onward)         Start     Ordered   02/14/20 1332  Limited resuscitation (code)  Continuous       Question Answer Comment  In the event of cardiac or respiratory ARREST: Initiate Code Blue, Call Rapid Response Yes   In the event of cardiac or respiratory ARREST: Perform CPR No   In the event of cardiac or respiratory ARREST: Perform Intubation/Mechanical Ventilation No   In the event of cardiac or respiratory ARREST: Use NIPPV/BiPAp only if indicated Yes   In the event of cardiac or respiratory ARREST: Administer ACLS medications if indicated Yes   In the event of cardiac or respiratory ARREST: Perform Defibrillation or Cardioversion if indicated Yes      02/14/20 1333        Code Status History    Date Active Date Inactive Code Status Order ID Comments User Context   03/01/2018 1949 03/08/2018 0028 Partial Code [id number]  Bethena Roys, MD Inpatient   01/30/2018 1849 02/03/2018 1726 Full Code [id number]  Cristy Folks, MD Inpatient   03/15/2017 1612 03/17/2017 1713 Full Code [id number]  Phillips Grout, MD ED   01/28/2017 0304 01/28/2017 1933 Full Code [id number]  Vianne Bulls, MD ED   11/13/2016 0549 11/13/2016 1704 Full Code [id number]  Jani Gravel, MD  Inpatient   06/22/2016 1337 06/22/2016 2104 Full Code [id number]  Troy Sine, MD Inpatient   04/23/2016 1543 04/26/2016 1934 DNR [id number]  Eber Jones, MD Inpatient   04/23/2016 1543 04/23/2016 1543 DNR [id number]  Eber Jones, MD Inpatient   09/29/2015 0215 09/29/2015 2119 Full Code [id number]  Oswald Hillock, MD Inpatient   04/21/2015 1506 04/22/2015 1811 Full Code [id number]  Orvan Falconer, MD ED   01/12/2015 0052 01/16/2015 1512 Full Code [id number]  Edwin Dada, MD Inpatient   11/17/2010 1810 11/20/2010 1310 Full Code [id number]  Marcy Panning, [place]  Inpatient   Advance Care Planning Activity        IV Access:    Peripheral IV   Procedures and diagnostic studies:   CT Code Stroke CTA Head W/WO contrast  Result Date: 02/14/2020 CLINICAL DATA:  87 year old female code stroke presentation. Neurologic exam raise the possibility of left ACA or MCA territory insult. EXAM: CT ANGIOGRAPHY HEAD AND NECK TECHNIQUE: Multidetector CT imaging of the head and neck was performed using the standard protocol during bolus administration of intravenous contrast. Multiplanar CT image reconstructions and MIPs were obtained to evaluate the vascular anatomy. Carotid stenosis measurements (when applicable) are obtained utilizing NASCET criteria, using the distal internal carotid diameter as the denominator. CONTRAST:  150 mL OMNIPAQUE IOHEXOL 350 MG/ML SOLN COMPARISON:  Plain head CT 1119 hours today. CTA head and neck 03/01/2018. FINDINGS: The initial arterial bolus attempt was non-diagnostic due to streak artifact from left shoulder arthroplasty interfering with the automatic contrast detection. But the repeat imaging attempt is of superior diagnostic quality. CTA NECK Skeleton: Chronically absent dentition. Cervical spine degeneration. Chronic left shoulder arthroplasty. No acute osseous abnormality identified. Upper chest: Small to moderate layering right pleural effusion. Otherwise stable, negative visible upper lungs. No superior mediastinal lymphadenopathy. Other neck: Stable.  No acute findings. Aortic arch: 3 vessel arch configuration. Chronic tortuosity of the thoracic aorta. Calcified arch atherosclerosis is stable. Right carotid system: Tortuous brachiocephalic artery and right CCA. Similar capacious right carotid bifurcation. Mild calcified plaque at the bifurcation with no stenosis. Tortuous right ICA is patent to the skull base. Left carotid system: Similar generalized left carotid ectasia/tortuosity with minimal plaque and no stenosis. Vertebral  arteries: Mild plaque and tortuosity of the proximal right subclavian artery without stenosis. Superior visualization of the right vertebral artery origin today, normal. Minimal right V1 calcified plaque without stenosis. Mild right vertebral tortuosity to the skull base. Mild plaque in the proximal left subclavian artery without stenosis. Normal left vertebral artery origin. Tortuous left vertebral is patent to the skull base without stenosis. CTA HEAD Posterior circulation: Patent and normal codominant distal vertebral arteries to the basilar. Patent PICA origins. Patent basilar artery without irregularity or stenosis. Normal SCA and PCA origins. Posterior communicating arteries are diminutive or absent. Bilateral PCA branches are within normal limits. Mildly tortuous left P1. Anterior circulation: Both ICA siphons are patent with mild tortuosity but minimal plaque and no stenosis. Patent carotid termini. Normal MCA and ACA origins. Mildly tortuous A1 segments are patent. Diminutive anterior communicating artery is normal. Bilateral ACA branches are stable and within normal limits. Left MCA M1 segment and trifurcation are patent without stenosis. Left MCA branches are stable and within normal limits. Right MCA M1, right MCA bifurcation, and right MCA branches are stable and within normal limits. Venous sinuses: Patent. Anatomic variants: None. Review of the MIP images confirms the above findings Preliminary results discussed by telephone with Dr. Kerney Elbe on  02/14/2020 at 1138 hours. IMPRESSION: 1. Negative for large vessel occlusion. Stable CTA head and neck since 2020, positive for generalized arterial tortuosity, but with minimal atherosclerosis for age and no arterial stenosis. 2. Small to moderate layering right pleural effusion. 3. Aortic Atherosclerosis (ICD10-I70.0). Preliminary results discussed by telephone with Dr. Kerney Elbe on 02/14/2020 at 1138 hours. Electronically Signed   By: Genevie [name] M.D.    On: 02/14/2020 11:53   CT Code Stroke CTA Neck W/WO contrast  Result Date: 02/14/2020 CLINICAL DATA:  87 year old female code stroke presentation. Neurologic exam raise the possibility of left ACA or MCA territory insult. EXAM: CT ANGIOGRAPHY HEAD AND NECK TECHNIQUE: Multidetector CT imaging of the head and neck was performed using the standard protocol during bolus administration of intravenous contrast. Multiplanar CT image reconstructions and MIPs were obtained to evaluate the vascular anatomy. Carotid stenosis measurements (when applicable) are obtained utilizing NASCET criteria, using the distal internal carotid diameter as the denominator. CONTRAST:  150 mL OMNIPAQUE IOHEXOL 350 MG/ML SOLN COMPARISON:  Plain head CT 1119 hours today. CTA head and neck 03/01/2018. FINDINGS: The initial arterial bolus attempt was non-diagnostic due to streak artifact from left shoulder arthroplasty interfering with the automatic contrast detection. But the repeat imaging attempt is of superior diagnostic quality. CTA NECK Skeleton: Chronically absent dentition. Cervical spine degeneration. Chronic left shoulder arthroplasty. No acute osseous abnormality identified. Upper chest: Small to moderate layering right pleural effusion. Otherwise stable, negative visible upper lungs. No superior mediastinal lymphadenopathy. Other neck: Stable.  No acute findings. Aortic arch: 3 vessel arch configuration. Chronic tortuosity of the thoracic aorta. Calcified arch atherosclerosis is stable. Right carotid system: Tortuous brachiocephalic artery and right CCA. Similar capacious right carotid bifurcation. Mild calcified plaque at the bifurcation with no stenosis. Tortuous right ICA is patent to the skull base. Left carotid system: Similar generalized left carotid ectasia/tortuosity with minimal plaque and no stenosis. Vertebral arteries: Mild plaque and tortuosity of the proximal right subclavian artery without stenosis. Superior  visualization of the right vertebral artery origin today, normal. Minimal right V1 calcified plaque without stenosis. Mild right vertebral tortuosity to the skull base. Mild plaque in the proximal left subclavian artery without stenosis. Normal left vertebral artery origin. Tortuous left vertebral is patent to the skull base without stenosis. CTA HEAD Posterior circulation: Patent and normal codominant distal vertebral arteries to the basilar. Patent PICA origins. Patent basilar artery without irregularity or stenosis. Normal SCA and PCA origins. Posterior communicating arteries are diminutive or absent. Bilateral PCA branches are within normal limits. Mildly tortuous left P1. Anterior circulation: Both ICA siphons are patent with mild tortuosity but minimal plaque and no stenosis. Patent carotid termini. Normal MCA and ACA origins. Mildly tortuous A1 segments are patent. Diminutive anterior communicating artery is normal. Bilateral ACA branches are stable and within normal limits. Left MCA M1 segment and trifurcation are patent without stenosis. Left MCA branches are stable and within normal limits. Right MCA M1, right MCA bifurcation, and right MCA branches are stable and within normal limits. Venous sinuses: Patent. Anatomic variants: None. Review of the MIP images confirms the above findings Preliminary results discussed by telephone with Dr. Kerney Elbe on 02/14/2020 at 1138 hours. IMPRESSION: 1. Negative for large vessel occlusion. Stable CTA head and neck since 2020, positive for generalized arterial tortuosity, but with minimal atherosclerosis for age and no arterial stenosis. 2. Small to moderate layering right pleural effusion. 3. Aortic Atherosclerosis (ICD10-I70.0). Preliminary results discussed by telephone with Dr. Kerney Elbe on  02/14/2020 at 1138 hours. Electronically Signed   By: Genevie [name] M.D.   On: 02/14/2020 11:53   CT Cervical Spine Wo Contrast  Result Date: 02/14/2020 CLINICAL DATA:  Code  stroke.  87 year old female.  Neck trauma. EXAM: CT CERVICAL SPINE WITHOUT CONTRAST TECHNIQUE: Multidetector CT imaging of the cervical spine was performed without intravenous contrast. Multiplanar CT image reconstructions were also generated. COMPARISON:  CT cervical spine 02/18/2009. FINDINGS: Alignment: Improved cervical lordosis from 2011. Mild degenerative appearing anterolisthesis of C3 on C4, associated with chronic right side facet arthropathy. Similar anterolisthesis of C7 on T1 with bilateral facet hypertrophy. Skull base and vertebrae: Visualized skull base is intact. No atlanto-occipital dissociation. Osteopenia. C1-C2 appear intact, although with mild to moderate joint space loss on the right. Normal C1-C2 alignment for the degree of head rotation. No acute osseous abnormality identified. Soft tissues and spinal canal: No prevertebral fluid or swelling. No visible canal hematoma. Calcified carotid atherosclerosis in the neck. Disc levels: Widespread cervical spine degeneration but only mild cervical spinal stenosis suspected. Upper chest: Negative. Other: Head CT reported separately. IMPRESSION: 1. No acute traumatic injury identified in the cervical spine. 2. Osteopenia.  Progressed cervical spine degeneration since 2011. Electronically Signed   By: Genevie [name] M.D.   On: 02/14/2020 11:29   DG Chest Portable 1 View  Result Date: 02/14/2020 CLINICAL DATA:  Fall. EXAM: PORTABLE CHEST 1 VIEW COMPARISON:  March 04, 2018. FINDINGS: Stable cardiomegaly. No pneumothorax or pleural effusion is noted. Both lungs are clear. Status post left total shoulder arthroplasty. Old right rib fractures are noted. IMPRESSION: No active disease. Electronically Signed   By: Marijo Conception M.D.   On: 02/14/2020 12:06   CT HEAD CODE STROKE WO CONTRAST  Result Date: 02/14/2020 CLINICAL DATA:  Code stroke.  87 year old female. EXAM: CT HEAD WITHOUT CONTRAST TECHNIQUE: Contiguous axial images were obtained from the base  of the skull through the vertex without intravenous contrast. COMPARISON:  Brain MRI 03/03/2018.  Head CT 11/14/2019. FINDINGS: Brain: Stable cerebral volume since last year. No midline shift, ventriculomegaly, mass effect, evidence of mass lesion, intracranial hemorrhage or evidence of cortically based acute infarction. Patchy and confluent bilateral cerebral white matter hypodensity is stable. No cortical encephalomalacia identified. Vascular: Calcified atherosclerosis at the skull base. No suspicious intracranial vascular hyperdensity. Skull: Stable.  No acute osseous abnormality identified. Sinuses/Orbits: Chronic left mastoid effusion. Other Visualized paranasal sinuses and mastoids are stable and well pneumatized. Other: Stable orbit and scalp soft tissues. ASPECTS [facility] Stroke Program Early CT Score) Total score (0-10 with 10 being normal): 10 IMPRESSION: 1. Stable non contrast CT appearance of the brain. Advanced white matter disease. ASPECTS 10. 2. These results were communicated to Dr. Cheral Marker at 11:24 am on 02/14/2020 by text page via the [facility] messaging system. Electronically Signed   By: Genevie [name] M.D.   On: 02/14/2020 11:25     Medical Consultants:    None.   Subjective:    [name] she relates she is in several fell but has no new complaints this morning, except she relates she is hungry.  Objective:    Vitals:   02/15/20 0246 02/15/20 0410 02/15/20 0735 02/15/20 0800  BP: (!) 136/92 (!) 124/91 126/86 125/79  Pulse: (!) 105 100 (!) 105 (!) 104  Resp: 19 20 (!) 23   Temp: 98.2 F (36.8 C) 98.6 F (37 C) 98.4 F (36.9 C)   TempSrc: Axillary Axillary Axillary   SpO2: 96% 94% 94%  Weight:       SpO2: 94 %   Intake/Output Summary (Last 24 hours) at 02/15/2020 0835 Last data filed at 02/15/2020 0000 Gross per 24 hour  Intake 592.34 ml  Output --  Net 592.34 ml   Filed Weights   02/14/20 1600  Weight: 49.9 kg    Exam: General exam: In no acute  distress. Respiratory system: Good air movement and clear to auscultation. Cardiovascular system: S1 & S2 heard, RRR. No JVD. Gastrointestinal system: Abdomen is nondistended, soft and nontender.  Extremities: No pedal edema. Skin: No rashes, lesions or ulcers   Data Reviewed:    Labs: Basic Metabolic Panel: Recent Labs  Lab 02/14/20 1100 02/14/20 1117  NA 132* 132*  K 4.4 4.1  CL 96* 95*  CO2 22  --   GLUCOSE 143* 144*  BUN 25* 28*  CREATININE 1.01* 0.80  CALCIUM 8.4*  --    GFR Estimated Creatinine Clearance: 40.5 mL/min (by C-G formula based on SCr of 0.8 mg/dL). Liver Function Tests: Recent Labs  Lab 02/14/20 1100  AST 26  ALT 22  ALKPHOS 117  BILITOT 0.6  PROT 6.1*  ALBUMIN 3.1*   No results for input(s): LIPASE, AMYLASE in the last 168 hours. No results for input(s): AMMONIA in the last 168 hours. Coagulation profile Recent Labs  Lab 02/14/20 1100  INR 1.1   COVID-19 Labs  No results for input(s): DDIMER, FERRITIN, LDH, CRP in the last 72 hours.  Lab Results  Component Value Date   [place] NEGATIVE 02/14/2020    CBC: Recent Labs  Lab 02/14/20 1100 02/14/20 1117  WBC 11.3*  --   NEUTROABS 8.7*  --   HGB 14.4 15.0  HCT 45.2 44.0  MCV 97.8  --   PLT 414*  --    Cardiac Enzymes: No results for input(s): CKTOTAL, CKMB, CKMBINDEX, TROPONINI in the last 168 hours. BNP (last 3 results) No results for input(s): PROBNP in the last 8760 hours. CBG: Recent Labs  Lab 02/14/20 1110  GLUCAP 148*   D-Dimer: No results for input(s): DDIMER in the last 72 hours. Hgb A1c: Recent Labs    02/15/20 0536  HGBA1C 5.6   Lipid Profile: Recent Labs    02/15/20 0536  CHOL 185  HDL 58  LDLCALC 101*  TRIG 130  CHOLHDL 3.2   Thyroid function studies: Recent Labs    02/14/20 1824  TSH 5.030*   Anemia work up: No results for input(s): VITAMINB12, FOLATE, FERRITIN, TIBC, IRON, RETICCTPCT in the last 72 hours. Sepsis Labs: Recent Labs   Lab 02/14/20 1100  WBC 11.3*   Microbiology Recent Results (from the past 240 hour(s))  Resp Panel by RT-PCR (Flu A&B, Covid) Nasopharyngeal Swab     Status: None   Collection Time: 02/14/20 12:14 PM   Specimen: Nasopharyngeal Swab; Nasopharyngeal(NP) swabs in vial transport medium  Result Value Ref Range Status   SARS Coronavirus 2 by RT PCR NEGATIVE NEGATIVE Final    Comment: (NOTE) SARS-CoV-2 target nucleic acids are NOT DETECTED.  The SARS-CoV-2 RNA is generally detectable in upper respiratory specimens during the acute phase of infection. The lowest concentration of SARS-CoV-2 viral copies this assay can detect is 138 copies/mL. A negative result does not preclude SARS-Cov-2 infection and should not be used as the sole basis for treatment or other patient management decisions. A negative result may occur with  improper specimen collection/handling, submission of specimen other than nasopharyngeal swab, presence of viral mutation(s) within the areas targeted  by this assay, and inadequate number of viral copies(<138 copies/mL). A negative result must be combined with clinical observations, patient history, and epidemiological information. The expected result is Negative.  Fact Sheet for Patients:  EntrepreneurPulse.com.au  Fact Sheet for Healthcare Providers:  IncredibleEmployment.be  This test is no t yet approved or cleared by the [place] FDA and  has been authorized for detection and/or diagnosis of SARS-CoV-2 by FDA under an Emergency Use Authorization (EUA). This EUA will remain  in effect (meaning this test can be used) for the duration of the COVID-19 declaration under Section 564(b)(1) of the Act, 21 U.S.C.section 360bbb-3(b)(1), unless the authorization is terminated  or revoked sooner.       Influenza A by PCR NEGATIVE NEGATIVE Final   Influenza B by PCR NEGATIVE NEGATIVE Final    Comment: (NOTE) The Xpert Xpress  SARS-CoV-2/FLU/RSV plus assay is intended as an aid in the diagnosis of influenza from Nasopharyngeal swab specimens and should not be used as a sole basis for treatment. Nasal washings and aspirates are unacceptable for Xpert Xpress SARS-CoV-2/FLU/RSV testing.  Fact Sheet for Patients: EntrepreneurPulse.com.au  Fact Sheet for Healthcare Providers: IncredibleEmployment.be  This test is not yet approved or cleared by the [place] FDA and has been authorized for detection and/or diagnosis of SARS-CoV-2 by FDA under an Emergency Use Authorization (EUA). This EUA will remain in effect (meaning this test can be used) for the duration of the COVID-19 declaration under Section 564(b)(1) of the Act, 21 U.S.C. section 360bbb-3(b)(1), unless the authorization is terminated or revoked.  Performed at [facility], [place] [address] [place], [place] [address]      Medications:   . atorvastatin  20 mg Oral QODAY  . busPIRone  10 mg Oral BID  . calcium carbonate  1,250 mg Oral Q breakfast  . digoxin  0.0625 mg Oral Daily  . edoxaban  30 mg Oral Q24H  . [START ON 02/17/2020] levothyroxine  37.5 mcg Intravenous Daily  . phenytoin  100 mg Oral TID  . sertraline  50 mg Oral Daily  . sodium chloride flush  10-40 mL Intracatheter Q12H  . tamsulosin  0.4 mg Oral Daily  . traZODone  50 mg Oral QHS   Continuous Infusions: . diltiazem (CARDIZEM) infusion 15 mg/hr (02/15/20 0431)      LOS: 1 day   Charlynne Cousins  [facility]  02/15/2020, 8:35 AM

## 2020-02-15 NOTE — Evaluation (Signed)
Physical Therapy Evaluation Patient Details Name: [name] MRN: [id number] DOB: 05/25/1932 Today's Date: 02/15/2020   History of Present Illness  87 y.o. female past medical history significant for advanced dementia, chronic A. fib on systemic anticoagulation, hypothyroidism is brought in by EMS for strokelike symptoms.  In the ED she was found in A. fib. MRI found multiple small acute infarcts involving bilateral frontoparietal lobes, left occipital lobe, and the left caudate.  Clinical Impression  Pt presents to PT with deficits in functional mobility, gait, balance, strength, power, cognition, communication, endurance, strength, power. Pt is lethargic intermittently during session and demonstrates impaired communication, having difficulty following commands for mobility with extremities but also demonstrating volitional movement of L side and RLE. Pt requires significant physical assistance for all functional mobility at this time and is unable to correct for any losses of balance when sitting when unsupported. Pt will benefit from continued acute PT POC to improve balance and functional mobility quality. PT recommends SNF placement at the time of discharge.    Follow Up Recommendations SNF    Equipment Recommendations  Wheelchair (measurements PT);Wheelchair cushion (measurements PT);Hospital bed (mechanical lift)    Recommendations for Other Services       Precautions / Restrictions Precautions Precautions: Fall Restrictions Weight Bearing Restrictions: No      Mobility  Bed Mobility Overal bed mobility: Needs Assistance Bed Mobility: Supine to Sit;Sit to Supine     Supine to sit: Total assist Sit to supine: Total assist   General bed mobility comments: pt does not assist with bed mobility    Transfers                 General transfer comment: deferred due to reduced level of alertness and impaired ability to follow commands  Ambulation/Gait                 Stairs            Wheelchair Mobility    Modified Rankin (Stroke Patients Only)       Balance Overall balance assessment: Needs assistance Sitting-balance support: Single extremity supported;Bilateral upper extremity supported;Feet supported Sitting balance-Leahy Scale: Zero Sitting balance - Comments: totalA, no ability to correct posture with loss of balance                                     Pertinent Vitals/Pain Pain Assessment: Faces Faces Pain Scale: Hurts little more Pain Location: intermittent grimacing with movement, pt unable to report site of pain Pain Descriptors / Indicators: Grimacing Pain Intervention(s): Monitored during session    Home Living Family/patient expects to be discharged to:: Assisted living (per chart)               Home Equipment:  (unsure, pt unable to report)      Prior Function Level of Independence:  (pt unable to report)               Hand Dominance        Extremity/Trunk Assessment   Upper Extremity Assessment Upper Extremity Assessment: RUE deficits/detail;LUE deficits/detail RUE Deficits / Details: no AROM noted, PROM WFL LUE Deficits / Details: pt does perform purposeful movement with LUE but does not follow commands with LUE at this time. Pt flexes digits and elbow during session actively.    Lower Extremity Assessment Lower Extremity Assessment: RLE deficits/detail;LLE deficits/detail RLE Deficits / Details: pt wiggles toes of  RLE, otherwise no AROM of RLE noted, PROM WFL LLE Deficits / Details: pt with some AROM of L ankle, knee, hip, does not follow commands, ROM appears WFL    Cervical / Trunk Assessment Cervical / Trunk Assessment: Kyphotic;Other exceptions Cervical / Trunk Exceptions: hard cervical collar in place  Communication   Communication: HOH;Receptive difficulties;Expressive difficulties  Cognition Arousal/Alertness: Lethargic Behavior During Therapy: Flat  affect Overall Cognitive Status: Impaired/Different from baseline Area of Impairment: Orientation;Attention;Memory;Following commands;Safety/judgement;Awareness;Problem solving                 Orientation Level: Disoriented to;Place;Time;Situation Current Attention Level: Focused   Following Commands: Follows one step commands inconsistently Safety/Judgement: Decreased awareness of safety;Decreased awareness of deficits Awareness: Intellectual Problem Solving: Slow processing General Comments: pt follows <25% of commands, no AROM noted of RUE at this time. Pt lifts head most consistently to command, with more difficulty following commands for extremities. Cognitive assessment also limited as pt is lethargic throughout session and require frequent cues to become alert      General Comments General comments (skin integrity, edema, etc.): VSS on RA    Exercises     Assessment/Plan    PT Assessment Patient needs continued PT services  PT Problem List Decreased strength;Decreased activity tolerance;Decreased balance;Decreased mobility;Decreased cognition;Decreased knowledge of use of DME;Decreased safety awareness;Decreased knowledge of precautions       PT Treatment Interventions DME instruction;Functional mobility training;Therapeutic activities;Gait training;Therapeutic exercise;Balance training;Neuromuscular re-education;Cognitive remediation;Patient/family education    PT Goals (Current goals can be found in the Care Plan section)  Acute Rehab PT Goals Patient Stated Goal: pt unable to state, PT goal to improve participation and sitting balance PT Goal Formulation: Patient unable to participate in goal setting Time For Goal Achievement: 02/29/20 Potential to Achieve Goals: Poor    Frequency Min 2X/week   Barriers to discharge        Co-evaluation               AM-PAC PT "6 Clicks" Mobility  Outcome Measure Help needed turning from your back to your side  while in a flat bed without using bedrails?: Total Help needed moving from lying on your back to sitting on the side of a flat bed without using bedrails?: Total Help needed moving to and from a bed to a chair (including a wheelchair)?: Total Help needed standing up from a chair using your arms (e.g., wheelchair or bedside chair)?: Total Help needed to walk in hospital room?: Total Help needed climbing 3-5 steps with a railing? : Total 6 Click Score: 6    End of Session Equipment Utilized During Treatment: Cervical collar Activity Tolerance: Patient limited by lethargy Patient left: in bed;with call bell/phone within reach (bed alarm unable to set, reporting pt does not weigh enough) Nurse Communication: Mobility status PT Visit Diagnosis: Other abnormalities of gait and mobility (R26.89);Muscle weakness (generalized) (M62.81);Other symptoms and signs involving the nervous system (R29.898);Hemiplegia and hemiparesis Hemiplegia - Right/Left: Right Hemiplegia - caused by: Cerebral infarction    Time: 1550-1615 PT Time Calculation (min) (ACUTE ONLY): 25 min   Charges:   PT Evaluation $PT Eval Moderate Complexity: 1 Mod          Zenaida Niece, PT, DPT Acute Rehabilitation Pager: [phone]   Zenaida Niece 02/15/2020, 4:35 PM

## 2020-02-15 NOTE — Progress Notes (Addendum)
STROKE TEAM PROGRESS NOTE   INTERVAL HISTORY No acute events overnight  MRI completed with sedation (IV ativan) this morning showing multiple small acute infarcts involving multiple vascular territories concerning for central embolic etiology.  Patient remains sedated post MRI. Somnolent and not reliably following commands.  No family at bedside. Nursing student at bedside.   Vitals:   02/15/20 0046 02/15/20 0246 02/15/20 0410 02/15/20 0735  BP: 138/89 (!) 136/92 (!) 124/91 126/86  Pulse: (!) 106 (!) 105 100 (!) 105  Resp: 20 19 20  (!) 23  Temp: 98.4 F (36.9 C) 98.2 F (36.8 C) 98.6 F (37 C) 98.4 F (36.9 C)  TempSrc: Axillary Axillary Axillary Axillary  SpO2: 96% 96% 94% 94%  Weight:       CBC:  Recent Labs  Lab 02/14/20 1100 02/14/20 1117  WBC 11.3*  --   NEUTROABS 8.7*  --   HGB 14.4 15.0  HCT 45.2 44.0  MCV 97.8  --   PLT 414*  --    Basic Metabolic Panel:  Recent Labs  Lab 02/14/20 1100 02/14/20 1117  NA 132* 132*  K 4.4 4.1  CL 96* 95*  CO2 22  --   GLUCOSE 143* 144*  BUN 25* 28*  CREATININE 1.01* 0.80  CALCIUM 8.4*  --    Lipid Panel:  Recent Labs  Lab 02/15/20 0536  CHOL 185  TRIG 130  HDL 58  CHOLHDL 3.2  VLDL 26  LDLCALC 101*   HgbA1c:  Recent Labs  Lab 02/15/20 0536  HGBA1C 5.6   Urine Drug Screen: No results for input(s): LABOPIA, COCAINSCRNUR, LABBENZ, AMPHETMU, THCU, LABBARB in the last 168 hours.  Alcohol Level  Recent Labs  Lab 02/14/20 1100  ETH <10    IMAGING past 24 hours CT Code Stroke CTA Head W/WO contrast  Result Date: 02/14/2020 CLINICAL DATA:  87 year old female code stroke presentation. Neurologic exam raise the possibility of left ACA or MCA territory insult. EXAM: CT ANGIOGRAPHY HEAD AND NECK TECHNIQUE: Multidetector CT imaging of the head and neck was performed using the standard protocol during bolus administration of intravenous contrast. Multiplanar CT image reconstructions and MIPs were obtained to  evaluate the vascular anatomy. Carotid stenosis measurements (when applicable) are obtained utilizing NASCET criteria, using the distal internal carotid diameter as the denominator. CONTRAST:  150 mL OMNIPAQUE IOHEXOL 350 MG/ML SOLN COMPARISON:  Plain head CT 1119 hours today. CTA head and neck 03/01/2018. FINDINGS: The initial arterial bolus attempt was non-diagnostic due to streak artifact from left shoulder arthroplasty interfering with the automatic contrast detection. But the repeat imaging attempt is of superior diagnostic quality. CTA NECK Skeleton: Chronically absent dentition. Cervical spine degeneration. Chronic left shoulder arthroplasty. No acute osseous abnormality identified. Upper chest: Small to moderate layering right pleural effusion. Otherwise stable, negative visible upper lungs. No superior mediastinal lymphadenopathy. Other neck: Stable.  No acute findings. Aortic arch: 3 vessel arch configuration. Chronic tortuosity of the thoracic aorta. Calcified arch atherosclerosis is stable. Right carotid system: Tortuous brachiocephalic artery and right CCA. Similar capacious right carotid bifurcation. Mild calcified plaque at the bifurcation with no stenosis. Tortuous right ICA is patent to the skull base. Left carotid system: Similar generalized left carotid ectasia/tortuosity with minimal plaque and no stenosis. Vertebral arteries: Mild plaque and tortuosity of the proximal right subclavian artery without stenosis. Superior visualization of the right vertebral artery origin today, normal. Minimal right V1 calcified plaque without stenosis. Mild right vertebral tortuosity to the skull base. Mild plaque in the  proximal left subclavian artery without stenosis. Normal left vertebral artery origin. Tortuous left vertebral is patent to the skull base without stenosis. CTA HEAD Posterior circulation: Patent and normal codominant distal vertebral arteries to the basilar. Patent PICA origins. Patent basilar  artery without irregularity or stenosis. Normal SCA and PCA origins. Posterior communicating arteries are diminutive or absent. Bilateral PCA branches are within normal limits. Mildly tortuous left P1. Anterior circulation: Both ICA siphons are patent with mild tortuosity but minimal plaque and no stenosis. Patent carotid termini. Normal MCA and ACA origins. Mildly tortuous A1 segments are patent. Diminutive anterior communicating artery is normal. Bilateral ACA branches are stable and within normal limits. Left MCA M1 segment and trifurcation are patent without stenosis. Left MCA branches are stable and within normal limits. Right MCA M1, right MCA bifurcation, and right MCA branches are stable and within normal limits. Venous sinuses: Patent. Anatomic variants: None. Review of the MIP images confirms the above findings Preliminary results discussed by telephone with Dr. Kerney Elbe on 02/14/2020 at 1138 hours. IMPRESSION: 1. Negative for large vessel occlusion. Stable CTA head and neck since 2020, positive for generalized arterial tortuosity, but with minimal atherosclerosis for age and no arterial stenosis. 2. Small to moderate layering right pleural effusion. 3. Aortic Atherosclerosis (ICD10-I70.0). Preliminary results discussed by telephone with Dr. Kerney Elbe on 02/14/2020 at 1138 hours. Electronically Signed   By: Genevie [name] M.D.   On: 02/14/2020 11:53   CT Code Stroke CTA Neck W/WO contrast  Result Date: 02/14/2020 CLINICAL DATA:  87 year old female code stroke presentation. Neurologic exam raise the possibility of left ACA or MCA territory insult. EXAM: CT ANGIOGRAPHY HEAD AND NECK TECHNIQUE: Multidetector CT imaging of the head and neck was performed using the standard protocol during bolus administration of intravenous contrast. Multiplanar CT image reconstructions and MIPs were obtained to evaluate the vascular anatomy. Carotid stenosis measurements (when applicable) are obtained utilizing NASCET  criteria, using the distal internal carotid diameter as the denominator. CONTRAST:  150 mL OMNIPAQUE IOHEXOL 350 MG/ML SOLN COMPARISON:  Plain head CT 1119 hours today. CTA head and neck 03/01/2018. FINDINGS: The initial arterial bolus attempt was non-diagnostic due to streak artifact from left shoulder arthroplasty interfering with the automatic contrast detection. But the repeat imaging attempt is of superior diagnostic quality. CTA NECK Skeleton: Chronically absent dentition. Cervical spine degeneration. Chronic left shoulder arthroplasty. No acute osseous abnormality identified. Upper chest: Small to moderate layering right pleural effusion. Otherwise stable, negative visible upper lungs. No superior mediastinal lymphadenopathy. Other neck: Stable.  No acute findings. Aortic arch: 3 vessel arch configuration. Chronic tortuosity of the thoracic aorta. Calcified arch atherosclerosis is stable. Right carotid system: Tortuous brachiocephalic artery and right CCA. Similar capacious right carotid bifurcation. Mild calcified plaque at the bifurcation with no stenosis. Tortuous right ICA is patent to the skull base. Left carotid system: Similar generalized left carotid ectasia/tortuosity with minimal plaque and no stenosis. Vertebral arteries: Mild plaque and tortuosity of the proximal right subclavian artery without stenosis. Superior visualization of the right vertebral artery origin today, normal. Minimal right V1 calcified plaque without stenosis. Mild right vertebral tortuosity to the skull base. Mild plaque in the proximal left subclavian artery without stenosis. Normal left vertebral artery origin. Tortuous left vertebral is patent to the skull base without stenosis. CTA HEAD Posterior circulation: Patent and normal codominant distal vertebral arteries to the basilar. Patent PICA origins. Patent basilar artery without irregularity or stenosis. Normal SCA and PCA origins. Posterior communicating arteries are  diminutive or absent. Bilateral PCA branches are within normal limits. Mildly tortuous left P1. Anterior circulation: Both ICA siphons are patent with mild tortuosity but minimal plaque and no stenosis. Patent carotid termini. Normal MCA and ACA origins. Mildly tortuous A1 segments are patent. Diminutive anterior communicating artery is normal. Bilateral ACA branches are stable and within normal limits. Left MCA M1 segment and trifurcation are patent without stenosis. Left MCA branches are stable and within normal limits. Right MCA M1, right MCA bifurcation, and right MCA branches are stable and within normal limits. Venous sinuses: Patent. Anatomic variants: None. Review of the MIP images confirms the above findings Preliminary results discussed by telephone with Dr. Kerney Elbe on 02/14/2020 at 1138 hours. IMPRESSION: 1. Negative for large vessel occlusion. Stable CTA head and neck since 2020, positive for generalized arterial tortuosity, but with minimal atherosclerosis for age and no arterial stenosis. 2. Small to moderate layering right pleural effusion. 3. Aortic Atherosclerosis (ICD10-I70.0). Preliminary results discussed by telephone with Dr. Kerney Elbe on 02/14/2020 at 1138 hours. Electronically Signed   By: Genevie [name] M.D.   On: 02/14/2020 11:53   CT Cervical Spine Wo Contrast  Result Date: 02/14/2020 CLINICAL DATA:  Code stroke.  87 year old female.  Neck trauma. EXAM: CT CERVICAL SPINE WITHOUT CONTRAST TECHNIQUE: Multidetector CT imaging of the cervical spine was performed without intravenous contrast. Multiplanar CT image reconstructions were also generated. COMPARISON:  CT cervical spine 02/18/2009. FINDINGS: Alignment: Improved cervical lordosis from 2011. Mild degenerative appearing anterolisthesis of C3 on C4, associated with chronic right side facet arthropathy. Similar anterolisthesis of C7 on T1 with bilateral facet hypertrophy. Skull base and vertebrae: Visualized skull base is intact. No  atlanto-occipital dissociation. Osteopenia. C1-C2 appear intact, although with mild to moderate joint space loss on the right. Normal C1-C2 alignment for the degree of head rotation. No acute osseous abnormality identified. Soft tissues and spinal canal: No prevertebral fluid or swelling. No visible canal hematoma. Calcified carotid atherosclerosis in the neck. Disc levels: Widespread cervical spine degeneration but only mild cervical spinal stenosis suspected. Upper chest: Negative. Other: Head CT reported separately. IMPRESSION: 1. No acute traumatic injury identified in the cervical spine. 2. Osteopenia.  Progressed cervical spine degeneration since 2011. Electronically Signed   By: Genevie [name] M.D.   On: 02/14/2020 11:29   DG Chest Portable 1 View  Result Date: 02/14/2020 CLINICAL DATA:  Fall. EXAM: PORTABLE CHEST 1 VIEW COMPARISON:  March 04, 2018. FINDINGS: Stable cardiomegaly. No pneumothorax or pleural effusion is noted. Both lungs are clear. Status post left total shoulder arthroplasty. Old right rib fractures are noted. IMPRESSION: No active disease. Electronically Signed   By: Marijo Conception M.D.   On: 02/14/2020 12:06   CT HEAD CODE STROKE WO CONTRAST  Result Date: 02/14/2020 CLINICAL DATA:  Code stroke.  87 year old female. EXAM: CT HEAD WITHOUT CONTRAST TECHNIQUE: Contiguous axial images were obtained from the base of the skull through the vertex without intravenous contrast. COMPARISON:  Brain MRI 03/03/2018.  Head CT 11/14/2019. FINDINGS: Brain: Stable cerebral volume since last year. No midline shift, ventriculomegaly, mass effect, evidence of mass lesion, intracranial hemorrhage or evidence of cortically based acute infarction. Patchy and confluent bilateral cerebral white matter hypodensity is stable. No cortical encephalomalacia identified. Vascular: Calcified atherosclerosis at the skull base. No suspicious intracranial vascular hyperdensity. Skull: Stable.  No acute osseous abnormality  identified. Sinuses/Orbits: Chronic left mastoid effusion. Other Visualized paranasal sinuses and mastoids are stable and well pneumatized. Other: Stable orbit and scalp  soft tissues. ASPECTS [facility] Stroke Program Early CT Score) Total score (0-10 with 10 being normal): 10 IMPRESSION: 1. Stable non contrast CT appearance of the brain. Advanced white matter disease. ASPECTS 10. 2. These results were communicated to Dr. Cheral Marker at 11:24 am on 02/14/2020 by text page via the [facility] messaging system. Electronically Signed   By: Genevie [name] M.D.   On: 02/14/2020 11:25   PHYSICAL EXAM Physical Exam  Constitutional: Appears well-developed and well-nourished.  Psych: Somnolent post ativan administration  Eyes: No scleral injection HENT: No OP obstrucion. C-collar in place.  Head: Normocephalic.  Cardiovascular: tachycardic Respiratory: No extra work of breathing.  SaO2 wnl on RA  Neuro: Mental Status: Patient is somnolent, arouses with loudly spoken first name. Does not participate in exam consistently or follow commands reliably.   Speech/Language: Verbalization is sparse without spontaneity. One word responses only.  Slow to respond to most commands. Does not respond to requests to repeat or name.  Cranial Nerves: II: Not cooperative for visual field testing III,IV, VI: EOMI intact, PERRL 86mm V: Does not respond to sensory exam questions VII: Grimace is equal and symmetric VIII: Hard of hearing to voice  IX, X: Uvula elevates symmetrically. XI: Does not cooperate with exam.  XII: tongue is midline without atrophy or fasciculations.  Motor: Does not follow commands. Moves spontaneously at least antigravity x 4 extremities.  5/5 strength L grip.  Sensory: Does not cooperate with testing. Withdraws and resists exam of eyes and  x 4 extremities.  Plantars: Toes are downgoing bilaterally. Babinski negative.  Cerebellar: Not cooperative with exam   Gait: deferred.   ASSESSMENT/PLAN  [name] is an 87 y.o. female with a PMHx of dementia, AF on AC, HTN, seizure disorder, remote CVA, HLD, and hypothyroidism. Patient presents by EMS from ALF as a code stroke. Per EMS and staff at facility, patient suffered an unwitnessed fall around 1005 hrs. Patient was found on the floor by staff and found to be altered. No seizure activity was witnessed. Upon arrival to ED she was found to have right sided weakness, mild expressive and receptive aphasia. She was not a candidate for tPA due to Farley therapy for her atrial fibrillation. In the ED she was given cardizem gtt for a fib w/ RVR.   Stroke - Embolic stroke with multiple small acute infarcts involving bilateral frontoparietal lobes, left occipital lobe, and the left caudate, embolic pattern likely due to known atrial fibrillation on edoxaban  CT head  Stable non contrast CT appearance of the brain. Advanced white matter disease. ASPECTS 10.  CTA head and neck Negative for large vessel occlusion.  MRI Multiple small acute infarcts involving bilateral frontoparietal lobes, left occipital lobe, and the left caudate. The high left frontoparietal region is most involved.    2D Echo PENDING  LDL 101  HgbA1c 5.6  VTE prophylaxis - Edoxaban  On Edoxaban  prior to admission (see note below) and currently ordered. Administered at ALF so should have been reliably taking. Will switch pt back to Eliquis with switching dilantin to Vimpat.    Therapy recommendations:  SNF  Disposition:  TBD  Chronic afib   A. fib RVR on admission  On Cardizem IV drip  Currently rate controlled  Was on edoxaban daily due to on Dilantin for seizure control, will switch in edoxadan back to Eliquis and switch Dilantin to Vimpat.  Hx of seizure  03/2017 admitted for right brain TIA versus complicated migraine.  Seizure also  in differential.  MRI/MRA negative.  Carotid Doppler negative.  EF 60 to 65%.  LDL 62 and A1c 5.3.  Continued on Coumadin and Lipitor  20.  01/2018 two episode of aphasia, concerning for seizure, put on Keppra 500 twice daily  03/2018 admitted for left-sided weakness and jerking movement.  EEG negative for seizure.  Keppra changed to Dilantin 100 mg 3 times daily due to drowsiness side effect.  However given drug drug interaction between Eliquis and Dilantin, Eliquis switched to edoxaban.  This admission, Dilantin level on admission <2.5. Dilantin switched to IV for NPO.  EEG no seizure  Will consider changing Dilantin to Vimpat to eliminate monitoring needs. SW, [place], contacted to investigate Vimpat affordability/cost.   Follows with Dr. Delice Lesch at [facility] neurology  Hypertension, well controlled   Home meds:  Cardizem   Stable . Long-term BP goal normotensive  Hyperlipidemia  Home meds:  Lipitor 20mg  QOD resumed in hospital.  Patient has a history of myalgias with statin per chart review and did not tolerate 40mg  Lipitor or Crestor daily dose in past.   LDL 101, goal < 70  Continue statin at discharge  Other Stroke Risk Factors  Advanced Age >/= 27   Hx stroke/TIA  CHF  Other Active Problems  Dementia on Aricept  Hyponatremia in the setting of CHF on diuretics and history of hyponatremia   Hospital day # 1   ATTENDING NOTE: I reviewed above note and agree with the assessment and plan. Pt was seen and examined.   87 year old female with history of A. fib on edoxaban, dementia on Aricept, seizure on Dilantin, hypertension, hyperlipidemia, CHF, living in nursing home presented to ER for a fall in the facility, found down on the floor and left-sided weakness.  Patient has history of seizure, was on Keppra 500 twice daily and then changed to Dilantin 100 tid due to drowsiness with Keppra.  Follow-up with Dr. Delice Lesch at [facility].  However this admission Dilantin level < 2.5.  EEG negative for seizure.  Given her Depakote Dilantin level, will change to Vimpat. SW is working on outpatient cost for Vimpat to  continue on discharge.  Patient also has A. fib was on Eliquis in the past but due to Dilantin drug drug interaction, patient Eliquis changed to edoxaban.  This time MRI showed multifocal infarcts embolic pattern, concerning for in edoxaban failure.  Given her AED is now changing to Vimpat, will also change NOAC back to Eliquis.  Pharmacy to dose.  On exam, patient eyes open, awake alert, on c-collar, not orientated to time place or age, stated she 87 year old.  Mild to moderate dysarthria, however able to name 2/3, follow most of the simple commands. Not cooperative with repeating sentences. Minimal speech output.  Tracking bilaterally, blinking to visual threat bilaterally.  Facial symmetry not able to accurately test due to c-collar.  Tongue protrusion not corporative.  Left upper extremity against gravity, right upper extremity mild withdraw to pain stimulation.  Left lower extremity 3/5 on pain stimulation, right lower extremity mild withdraw to pain summation. Sensation, coordination and gait not tested.  Recommend continue Eliquis and Vimpat for seizure and stroke prevention.  Continue statin.  A. fib RVR and c-collar clearance per primary team.  Patient will continue follow-up with Dr. Delice Lesch as scheduled at [facility] on 03/14/2020.  Neurology will sign off. Please call with questions. Thanks for the consult.   Rosalin Hawking, MD PhD Stroke Neurology 02/15/2020 5:36 PM     To contact Stroke Continuity  provider, please refer to [email or web address] After hours, contact General Neurology

## 2020-02-15 NOTE — Progress Notes (Signed)
Bedside swallow evaluation -Speech Pathology    02/15/20 1005  SLP Visit Information  SLP Received On 02/15/20  General Information  Date of Onset 02/14/20  HPI [name] is an 87 y.o. female past medical history significant for advanced dementia, chronic A. fib, hypothyroidism, GERD, stroke, dysphagia is brought in for strokelike symptoms. CT no acute process. MRI revealed Multiple small acute infarcts involving bilateral frontoparietal lobes, left occipital lobe, and the left caudate. The high left frontoparietal region is most involved. Mild edema without mass  effect. Involvement of multiple vascular territories is concerning for a central embolic etiology, advanced chronic microvascular ischemic disease, bilateral cerebellar and right temporal lobe prior microhemorrhages, possibly the sequela of hypertension, amyloid angiopathy, or emboli. BSE at [facility] 03/02/18 NPO, MBS 03/03/18 min oral discoordination, otherwise normal study for age, esophageal scan unremarkable and reg/thin recommended.  Type of Study Bedside Swallow Evaluation  Previous Swallow Assessment  (see HPI)  Diet Prior to this Study NPO  Temperature Spikes Noted No  Respiratory Status Room air  History of Recent Intubation No  Behavior/Cognition Lethargic/Drowsy;Requires cueing  Oral Cavity Assessment Dry  Oral Care Completed by SLP Yes  Oral Cavity - Dentition Dentures, top;Dentures, bottom  Vision Functional for self-feeding  Self-Feeding Abilities Needs assist  Patient Positioning Upright in bed  Baseline Vocal Quality Normal  Volitional Cough Cognitively unable to elicit  Volitional Swallow Unable to elicit  Pain Assessment  Pain Assessment Faces  Faces Pain Scale 2  Pain Intervention(s) Monitored during session;Repositioned  Oral Assessment (Complete on admission/transfer/change in patient condition)  Does patient have any of the following "high(er) risk" factors? Nutritional status - fluids only or NPO for >24  hours  Does patient have any of the following "at risk" factors? Other - dysphagia;Saliva - thick, dry mouth  Patient is HIGH RISK: Non-ventilated Order set for Adult Oral Care Protocol initiated - "High Risk Patients - Non-Ventilated" option selected  (see row information)  Oral Motor/Sensory Function  Overall Oral Motor/Sensory Function  (difficulty hearing and following directions)  Ice Chips  Ice chips NT  Thin Liquid  Thin Liquid Impaired  Presentation Spoon  Oral Phase Impairments Reduced labial seal  Oral Phase Functional Implications Right anterior spillage;Oral holding;Prolonged oral transit  Pharyngeal  Phase Impairments Cough - Immediate  Nectar Thick Liquid  Nectar Thick Liquid NT  Honey Thick Liquid  Honey Thick Liquid NT  Puree  Puree Impaired  Oral Phase Impairments Reduced lingual movement/coordination  Oral Phase Functional Implications Oral holding;Prolonged oral transit  Solid  Solid NT  SLP - End of Session  Patient left in bed;with nursing in room  Nurse Communication Diet recommendation  SLP Assessment  Clinical Impression Statement (ACUTE ONLY) Pt lethargic due to Ativan this morning for her MRI. SLP provided frequent verbal and tactile cues throughout. Difficulty following oral-motor commands but no focal weakness noted. Upper dentures donned on arrival and lower dentures placed after oral hygiene. Pt exhibited reduced oral control and periods of holding boluses (puree > thin) marked by anterior leakage. Suspect a pharyngeal dysphagia as well noteably with  immediate cough of larger boluses of thin. She consumed crushed pill in applesauce with mild delays and dry spoon presentations to initiate. Recommend continue NPO except crush meds and ST will follow for po readiness.  SLP Visit Diagnosis Dysphagia, unspecified (R13.10)  Impact on safety and function Moderate aspiration risk  Other Related Risk Factors Cognitive impairment;Deconditioning  Swallow Evaluation  Recommendations  SLP Diet Recommendations NPO except meds;Other (  Comment) (crush meds)  Medication Administration Crushed with puree  Treatment Plan  Oral Care Recommendations Oral care QID  Treatment Recommendations Therapy as outlined in treatment plan below  Follow up Recommendations Skilled Nursing facility  Speech Therapy Frequency (ACUTE ONLY) min 2x/week  Treatment Duration 2 weeks  Interventions Diet toleration management by SLP;Trials of upgraded texture/liquids  Prognosis  Prognosis for Safe Diet Advancement  (fair-good)  Barriers to Reach Goals Cognitive deficits  Individuals Consulted  Consulted and Agree with Results and Recommendations RN  SLP Time Calculation  SLP Start Time (ACUTE ONLY) 1511  SLP Stop Time (ACUTE ONLY) 1532  SLP Time Calculation (min) (ACUTE ONLY) 21 min  SLP Evaluations  $ SLP Speech Visit 1 Visit  SLP Evaluations  $BSS Swallow 1 Procedure   [name] M.Ed Risk analyst [phone] Office [phone]

## 2020-02-15 NOTE — Progress Notes (Signed)
Pt is now in yellow MEWS due to increased respirations and increased HR. MD notified

## 2020-02-15 NOTE — Progress Notes (Signed)
EEG attempted, pt went for MRI, will attempt later when our schedule permit

## 2020-02-15 NOTE — Procedures (Signed)
Patient Name: [name]  MRN: [id number]  Epilepsy Attending: Lora Havens  Referring Physician/Provider: Dr Rosalin Hawking Date: 02/15/2020 Duration: 25.37 mins  Patient history: 87yo F with h/o seizure presented after fall and ams. EEG to evaluate for seizure  Level of alertness: Awake, asleep  AEDs during EEG study: PHT  Technical aspects: This EEG study was done with scalp electrodes positioned according to the 10-20 International system of electrode placement. Electrical activity was acquired at a sampling rate of 500Hz  and reviewed with a high frequency filter of 70Hz  and a low frequency filter of 1Hz . EEG data were recorded continuously and digitally stored.   Description: No clear posterior dominant rhythm was seen. Sleep was characterized by sleep spindles (12 to 14 Hz), maximal frontocentral region. EEG showed continuous generalized and maximal left frontal region 3 to 6 Hz theta-delta slowing. Hyperventilation and photic stimulation were not performed.     ABNORMALITY -Continuous slow, generalized and maximal left frontal region  IMPRESSION: This study is suggestive of cortical dysfunction in left frontal region likely due to underlying stroke. There is also moderate diffuse encephalopathy, nonspecific etiology. No seizures or epileptiform discharges were seen throughout the recording.  Lucah Petta Barbra Sarks

## 2020-02-15 NOTE — TOC Initial Note (Signed)
Transition of Care [facility]) - Initial/Assessment Note    Patient Details  Name: [name] MRN: [id number] Date of Birth: Sep 29, 1932  Transition of Care [facility]) CM/SW Contact:    Geralynn Ochs, LCSW Phone Number: 02/15/2020, 2:29 PM  Clinical Narrative:     CSW spoke with patient's son, [name], confirmed plan to return to [facility] when medically stable. Patient is LTC resident. CSW to follow.              Expected Discharge Plan: Skilled Nursing Facility Barriers to Discharge: Continued Medical Work up   Patient Goals and CMS Choice Patient states their goals for this hospitalization and ongoing recovery are:: patient unable to participate in goal setting, only oriented to self and place CMS Medicare.gov Compare Post Acute Care list provided to:: Patient Represenative (must comment) Choice offered to / list presented to : Adult Children  Expected Discharge Plan and Services Expected Discharge Plan: [place] Choice: [place] arrangements for the past 2 months: [place]                                      Prior Living Arrangements/Services Living arrangements for the past 2 months: [place] Lives with:: Facility Resident Patient language and need for interpreter reviewed:: No Do you feel safe going back to the place where you live?: Yes      Need for Family Participation in Patient Care: Yes (Comment) Care giver support system in place?: Yes (comment)   Criminal Activity/Legal Involvement Pertinent to Current Situation/Hospitalization: No - Comment as needed  Activities of Daily Living      Permission Sought/Granted Permission sought to share information with : Facility Retail banker granted to share information with : Yes, Verbal Permission Granted  Share Information with NAME: Ray  Permission granted to share info w AGENCY:  [facility]  Permission granted to share info w Relationship: Son     Emotional Assessment   Attitude/Demeanor/Rapport: Unable to Assess Affect (typically observed): Unable to Assess Orientation: : Oriented to Self,Oriented to Place Alcohol / Substance Use: Not Applicable Psych Involvement: No (comment)  Admission diagnosis:  CVA (cerebral vascular accident) ([place]) [I63.9] Atrial fibrillation with rapid ventricular response ([place]) [I48.91] Fall, initial encounter [W19.XXXA] Cerebrovascular accident (CVA), unspecified mechanism ([place]) [I63.9] Patient Active Problem List   Diagnosis Date Noted  . Seizure ([place]) 03/02/2018  . Left-sided weakness 03/01/2018  . Aphasia 01/30/2018  . CVA (cerebral vascular accident) ([place]) 01/30/2018  . History of Clostridioides difficile colitis 12/23/2017  . Mild dementia ([place]) 12/23/2017  . History of TIA (transient ischemic attack) 03/15/2017  . Osteoporosis 02/24/2017  . Prolonged QT interval 01/28/2017  . Unstable angina ([place]) 06/16/2016  . Atrial flutter ([place]) 06/16/2016  . Functional urinary incontinence 07/15/2015  . Hypo-osmolality and hyponatremia 07/15/2015  . Overactive bladder 05/02/2015  . History of CVA (cerebrovascular accident) 04/21/2015  . History of dysarthria 02/20/2015  . Essential hypertension 01/11/2015  . Hyponatremia 01/11/2015  . Chronic pain syndrome 11/25/2014  . Insomnia 11/25/2014  . Chronic diastolic CHF (congestive heart failure) ([place]) 11/12/2014  . Paroxysmal atrial fibrillation ([place]) 10/14/2014  . Dyslipidemia 09/10/2014  . Long term (current) use of anticoagulants 02/05/2014  . Hemorrhoids 07/16/2013  . Irritable bowel syndrome (IBS) 06/19/2013  . Atrial fibrillation with rapid ventricular response ([place])   . Hypothyroidism   .  Carotid bruit   . S/P knee replacement   . DIVERTICULOSIS-COLON 01/19/2010   PCP:  Dettinger, Fransisca Kaufmann, MD Pharmacy:   [place], [place] - [address] [place] [address] Phone: [phone] Fax: [phone]     Social Determinants of Health (SDOH) Interventions    Readmission Risk Interventions No flowsheet data found.

## 2020-02-15 NOTE — Progress Notes (Signed)
OT Cancellation Note  Patient Details Name: [name] MRN: [id number] DOB: 1932-04-22   Cancelled Treatment:    Reason Eval/Treat Not Completed: Patient at procedure or test/ unavailable Attempted to start session. Pt very lethargic and keeping eyes shut majority of the time. Pt wincing and retracting LLE with touch to ankle and foot. Notified RN. Evaluation limited as transport arrived to take pt to ECHO. OT will return later as time allows and pt is appropriate.   [facility] OTR/L Acute Rehabilitation Services Office: [place] 02/15/2020, 3:19 PM

## 2020-02-15 NOTE — Progress Notes (Signed)
ANTICOAGULATION CONSULT NOTE - Initial Consult  Pharmacy Consult for apixaban Indication: atrial fibrillation  Allergies  Allergen Reactions  . Penicillins Rash and Other (See Comments)    Has patient had a PCN reaction causing immediate rash, facial/tongue/throat swelling, SOB or lightheadedness with hypotension: No Has patient had a PCN reaction causing severe rash involving mucus membranes or skin necrosis: No Has patient had a PCN reaction that required hospitalization No Has patient had a PCN reaction occurring within the last 10 years: No If all of the above answers are "NO", then may proceed with Cephalosporin use.    Patient Measurements: Weight: 49.9 kg (110 lb)    Vital Signs: Temp: 98.2 F (36.8 C) (02/11 1635) Temp Source: Axillary (02/11 1635) BP: 107/71 (02/11 1635) Pulse Rate: 86 (02/11 1605)  Labs: Recent Labs    02/14/20 1100 02/14/20 1117 02/15/20 0536  HGB 14.4 15.0  --   HCT 45.2 44.0  --   PLT 414*  --   --   APTT 31  --   --   LABPROT 14.2  --   --   INR 1.1  --   --   CREATININE 1.01* 0.80 0.69    Estimated Creatinine Clearance: 40.5 mL/min (by C-G formula based on SCr of 0.69 mg/dL).  Assessment: 87 yo W previously on edoxaban 30mg  daily for afib now admitted for embolic CVA despite edoxaban. Pharmacy consulted to switch to apixaban.   Previously patient was on edoxaban partly due to drug interaction with other DOACs and phenytoin but patient has now been switched to lacosamide for seizure prevention and there is no further interaction.   ~ClCr 40 ml/min. Last dose edoxaban 2/11 1530. Will start apixaban 2/12. Note patient is 87 years old and 50 kg so should be on 2.5mg  apixaban per FDA approved dosing.  Goal of Therapy:  Monitor platelets by anticoagulation protocol: Yes   Plan:  Stop edoxaban  Start apixaban 2.5mg  BID on 2/12 Monitor for signs/symptoms of bleeding    Benetta Spar, PharmD, BCPS, BCCP Clinical Pharmacist  Please  check AMION for all [place] phone numbers After 10:00 PM, call [place] [phone]

## 2020-02-15 NOTE — Progress Notes (Signed)
  Echocardiogram 2D Echocardiogram has been performed.  [name] 02/15/2020, 3:03 PM

## 2020-02-15 NOTE — Progress Notes (Signed)
EEG complete - results pending 

## 2020-02-15 NOTE — TOC Benefit Eligibility Note (Signed)
Transition of Care [facility]) Benefit Eligibility Note    Patient Details  Name: LASHAY OSBORNE MRN: [id number] Date of Birth: 11/23/1932   Medication/Dose: Vimpat 100mg . bid for 30 day supply  Covered?: Yes  Tier: Other (4)  Prescription Coverage Preferred Pharmacy: [facility]  Spoke with Person/Company/Phone Number:: [place] [phone]  Co-Pay: $100  Prior Approval: No  Deductible:  (no deductible)       Shelda Altes Phone Number: 02/15/2020, 3:29 PM

## 2020-02-16 DIAGNOSIS — I429 Cardiomyopathy, unspecified: Secondary | ICD-10-CM

## 2020-02-16 DIAGNOSIS — I4891 Unspecified atrial fibrillation: Secondary | ICD-10-CM | POA: Diagnosis not present

## 2020-02-16 DIAGNOSIS — I10 Essential (primary) hypertension: Secondary | ICD-10-CM | POA: Diagnosis not present

## 2020-02-16 DIAGNOSIS — I5021 Acute systolic (congestive) heart failure: Secondary | ICD-10-CM

## 2020-02-16 LAB — BASIC METABOLIC PANEL
Anion gap: 12 (ref 5–15)
BUN: 21 mg/dL (ref 8–23)
CO2: 20 mmol/L — ABNORMAL LOW (ref 22–32)
Calcium: 8.5 mg/dL — ABNORMAL LOW (ref 8.9–10.3)
Chloride: 104 mmol/L (ref 98–111)
Creatinine, Ser: 0.83 mg/dL (ref 0.44–1.00)
GFR, Estimated: >60 mL/min (ref >60)
Glucose, Bld: 132 mg/dL — ABNORMAL HIGH (ref 70–99)
Potassium: 4.2 mmol/L (ref 3.5–5.1)
Sodium: 136 mmol/L (ref 135–145)

## 2020-02-16 LAB — TROPONIN I (HIGH SENSITIVITY)
Troponin I (High Sensitivity): 91 ng/L — ABNORMAL HIGH (ref <18)
Troponin I (High Sensitivity): 103 ng/L (ref <18)
Troponin I (High Sensitivity): 105 ng/L (ref <18)

## 2020-02-16 MED ORDER — DILTIAZEM HCL 30 MG PO TABS
30.0000 mg | ORAL_TABLET | Freq: Four times a day (QID) | ORAL | Status: DC
  Administered 2020-02-16 – 2020-02-18 (×7): 30 mg via ORAL
  Filled 2020-02-16 (×7): qty 1

## 2020-02-16 MED ORDER — LEVOTHYROXINE SODIUM 75 MCG PO TABS
75.0000 ug | ORAL_TABLET | Freq: Every day | ORAL | Status: DC
  Administered 2020-02-17 – 2020-02-19 (×3): 75 ug via ORAL
  Filled 2020-02-16 (×3): qty 1

## 2020-02-16 MED ORDER — DILTIAZEM HCL 30 MG PO TABS
60.0000 mg | ORAL_TABLET | Freq: Four times a day (QID) | ORAL | Status: DC
  Administered 2020-02-16 (×2): 60 mg via ORAL
  Filled 2020-02-16: qty 2

## 2020-02-16 MED ORDER — METOPROLOL TARTRATE 5 MG/5ML IV SOLN
5.0000 mg | Freq: Four times a day (QID) | INTRAVENOUS | Status: DC | PRN
  Administered 2020-02-16 – 2020-02-17 (×2): 5 mg via INTRAVENOUS
  Filled 2020-02-16 (×2): qty 5

## 2020-02-16 MED ORDER — LACOSAMIDE 50 MG PO TABS
100.0000 mg | ORAL_TABLET | Freq: Two times a day (BID) | ORAL | Status: DC
  Administered 2020-02-16 – 2020-02-19 (×7): 100 mg via ORAL
  Filled 2020-02-16 (×7): qty 2

## 2020-02-16 MED ORDER — ATORVASTATIN CALCIUM 40 MG PO TABS
40.0000 mg | ORAL_TABLET | ORAL | Status: DC
  Administered 2020-02-17 – 2020-02-19 (×2): 40 mg via ORAL
  Filled 2020-02-16 (×2): qty 1

## 2020-02-16 MED ORDER — METOPROLOL TARTRATE 12.5 MG HALF TABLET
12.5000 mg | ORAL_TABLET | Freq: Two times a day (BID) | ORAL | Status: DC
  Administered 2020-02-16: 12.5 mg via ORAL
  Filled 2020-02-16: qty 1

## 2020-02-16 MED ORDER — METOPROLOL TARTRATE 25 MG PO TABS
25.0000 mg | ORAL_TABLET | Freq: Two times a day (BID) | ORAL | Status: DC
  Administered 2020-02-16: 25 mg via ORAL
  Filled 2020-02-16: qty 1

## 2020-02-16 NOTE — Consult Note (Addendum)
Cardiology Consultation:   Patient ID: [name]; [id number]; 1932/01/09   Admit date: 02/14/2020 Date of Consult: 02/16/2020  Primary Care Provider: Dettinger, Fransisca Kaufmann, MD Primary Cardiologist: Minus Breeding, MD     Patient Profile:   [name] is a 87 y.o. female with a hx of atrial fib who is being seen today for the evaluation of decreased EF at the request of Dettinger, Fransisca Kaufmann, MD.  History of Present Illness:   [name] has a history of atrial fibrillation.  She has advanced dementia.  She was brought to the emergency room with symptoms consistent with right-sided weakness and left facial droop.  MRI demonstrated multiple small acute infarcts involving bilateral frontoparietal lobes, left occipital lobe and left caudate.  She has advanced microvascular ischemic disease.  She has bilateral cerebellar microhemorrhages.  She was in atrial fibrillation with rapid rate and was initially treated with IV Cardizem switched to diltiazem.  She has been continued on Eliquis.  She is found to have a newly reduced ejection fraction of 30%.  There were regional wall motion abnormalities.  She has akinesis of the anterior wall, anteroseptal wall, apical segment and inferoapical segment.  This is thought to be related to ischemia or may be a stress cardiomyopathy.  Right ventricular systolic function was reduced.  There were mildly elevated pulmonary pressures.  Her echo in 2020 demonstrated an EF of 60 to 65%.  There were no wall motion abnormalities.  The patient had been living in a nursing home.  She been ambulating with a walker but developed increased back pain and had stopped doing this.  She has had to have help with lots of her daily living like bathing and dressing.  She was having no acute acute cardiac complaints when I saw her in October of last year.  When I walked into the room she has alert and responsive.  She can tell me her name and date of birth.  She knew she was in  the hospital but thought she was in any pain.  She was not oriented to year.  She denies any acute complaints such as chest pressure, neck or arm discomfort.  She is not reporting shortness of breath.  She is lying flat.  I called and spoke with her son.  She has had a decline particularly in the last few weeks.  Most of this is related to the pain as above.  He has not noted any acute complaints of shortness of breath or chest pain.  (Please note that the patient was unable to give history.  All past medical history and other is from previous charts.)   Past Medical History:  Diagnosis Date  . Anemia   . Atrial fibrillation ([place])   . Calf pain    September, 2012, at rest  . Carotid bruit    Doppler, December, 2009, no abnormality  . Cataract   . Diverticulosis   . GERD (gastroesophageal reflux disease)   . History of shingles   . HLD (hyperlipidemia)   . HTN (hypertension)   . Hypothyroidism   . Insomnia   . Osteoarthritis   . PONV (postoperative nausea and vomiting)   . Rectal bleeding 2001   diverticulosis and int hemorrhoids on 07/1999 and 02/2010 colonoscopies.  . Seizures ([place])   . Stroke ([place])   . Warfarin anticoagulation     Past Surgical History:  Procedure Laterality Date  . BIOPSY  09/29/2015   Procedure: BIOPSY;  Surgeon: Bernadene Person  Gloriann Loan, MD;  Location: [facility];  Service: Endoscopy;;  gastric  . CATARACT EXTRACTION W/PHACO Right 04/15/2014   Procedure: CATARACT EXTRACTION PHACO AND INTRAOCULAR LENS PLACEMENT (IOC);  Surgeon: Tonny Branch, MD;  Location: [facility];  Service: Ophthalmology;  Laterality: Right;  CDE: 13.30  . CATARACT EXTRACTION W/PHACO Left 05/13/2014   Procedure: CATARACT EXTRACTION PHACO AND INTRAOCULAR LENS PLACEMENT LEFT EYE;  Surgeon: Tonny Branch, MD;  Location: [facility];  Service: Ophthalmology;  Laterality: Left;  CDE:13.00  . CHOLECYSTECTOMY N/A 01/15/2015   Procedure: LAPAROSCOPIC CHOLECYSTECTOMY;  Surgeon: Ralene Ok, MD;  Location: [facility];   Service: General;  Laterality: N/A;  . COLONOSCOPY N/A 09/29/2015   Procedure: COLONOSCOPY;  Surgeon: Rogene Houston, MD;  [facility]: [facility];  Service: Endoscopy;  Laterality: N/A;  . COLONOSCOPY    . ESOPHAGOGASTRODUODENOSCOPY N/A 01/13/2015   Procedure: ESOPHAGOGASTRODUODENOSCOPY (EGD);  Surgeon: Jerene Bears, MD;  Location[facility];  Service: Endoscopy;  Laterality: N/A;  . ESOPHAGOGASTRODUODENOSCOPY N/A 09/29/2015   Procedure: ESOPHAGOGASTRODUODENOSCOPY (EGD);  Surgeon: Rogene Houston, MD;  [facility]: [facility];  Service: Endoscopy;  Laterality: N/A;  12:15  . GALLBLADDER SURGERY    . LEFT HEART CATH AND CORONARY ANGIOGRAPHY N/A 06/22/2016   Procedure: Left Heart Cath and Coronary Angiography;  Surgeon: Troy Sine, MD;  Location: [facility];  Service: Cardiovascular;  Laterality: N/A;  . SHOULDER OPEN ROTATOR CUFF REPAIR     bilateral  . TOTAL KNEE ARTHROPLASTY  05/26/10   right  . TOTAL KNEE ARTHROPLASTY  11/17/2010   Procedure: TOTAL KNEE ARTHROPLASTY;  Surgeon: Mauri Pole;  Location: [facility];  Service: Orthopedics;  Laterality: Left;  . TOTAL KNEE ARTHROPLASTY     Right  . TOTAL SHOULDER REPLACEMENT  01/2010   left     Home Medications:  Prior to Admission medications   Medication Sig Start Date End Date Taking? Authorizing Provider  acetaminophen (TYLENOL) 325 MG tablet Take 650 mg by mouth See admin instructions. Take 650 mg by mouth two times a day and an additional 650 mg every six hours as needed for mild pain   Yes [provider]  atorvastatin (LIPITOR) 20 MG tablet Take 1 tablet (20 mg total) by mouth every other day. 09/14/19  Yes Dettinger, Fransisca Kaufmann, MD  busPIRone (BUSPAR) 10 MG tablet Take 1 tablet (10 mg total) by mouth 2 (two) times daily. 10/24/19  Yes Dettinger, Fransisca Kaufmann, MD  Calcium Carbonate (CALCIUM 500 PO) Take 500 mg by mouth daily.   Yes [provider]  clotrimazole-betamethasone (LOTRISONE) cream Apply topically 2  times daily to affected areas until rash clears Patient taking differently: Apply 1 application topically See admin instructions. Apply to affected areas 2 times a day until rash clears 01/22/19  Yes Dettinger, Fransisca Kaufmann, MD  Dextran 70-Hypromellose, PF, 0.1-0.3 % SOLN Place 2 drops into the left eye daily as needed (for dryness).   Yes [provider]  digoxin (LANOXIN) 0.125 MG tablet Take 0.5 tablets (0.0625 mg total) by mouth daily. 04/12/19  Yes Minus Breeding, MD  diltiazem (CARDIZEM LA) 240 MG 24 hr tablet Take 240 mg by mouth daily.   Yes [provider]  donepezil (ARICEPT) 5 MG tablet TAKE ONE TABLET AT BEDTIME FOR MEMORY Patient taking differently: Take 5 mg by mouth at bedtime. 08/08/19  Yes Cameron Sprang, MD  hydrALAZINE (APRESOLINE) 10 MG tablet TAKE 1 TABLET 2 TIMES A DAY Patient taking differently: Take 10  mg by mouth in the morning and at bedtime. 10/24/19  Yes Dettinger, Fransisca Kaufmann, MD  hydrocortisone (ANUSOL-HC) 25 MG suppository Place 1 suppository (25 mg total) rectally 2 (two) times daily. Patient taking differently: Place 25 mg rectally 2 (two) times daily as needed (AS DIRECTED). 08/16/19  Yes Dettinger, Fransisca Kaufmann, MD  [place] [facility]) OINT Apply 1 application topically See admin instructions. Apply to buttocks after incontinence and as needed/as directed 02/10/20  Yes [provider]  levothyroxine (SYNTHROID) 75 MCG tablet TAKE 1 TABLET ONCE DAILY Patient taking differently: Take 75 mcg by mouth in the morning. 09/21/19  Yes Dettinger, Fransisca Kaufmann, MD  Lidocaine 4 % PTCH Apply 1 patch topically See admin instructions. Apply 1 patch to the lower back and leave in place for 12 hours- remove afterwards   Yes [provider]  loperamide (IMODIUM) 2 MG capsule TAKE (1) CAPSULE FOUR TIMES DAILY AS NEEDED FOR DIARRHEA OR LOOSE STOOLS Patient taking differently: Take 2 mg by mouth 4 (four) times daily as needed for diarrhea or loose stools.  04/11/19  Yes Dettinger, Fransisca Kaufmann, MD  melatonin 5 MG TABS Take 5 mg by mouth at bedtime.   Yes [provider]  Multiple Vitamin (MULTIVITAMIN WITH MINERALS) TABS tablet Take 1 tablet by mouth every morning.   Yes [provider]  NON FORMULARY Take 120 mLs by mouth See admin instructions. MedPass- Drink 120 ml's by mouth three times a day with medications   Yes [provider]  phenytoin (DILANTIN) 100 MG ER capsule Take 1 capsule (100 mg total) by mouth 3 (three) times daily. 08/08/19  Yes Cameron Sprang, MD  potassium chloride SA (KLOR-CON) 20 MEQ tablet Take 1 tablet (20 mEq total) by mouth daily. 11/14/19  Yes Sherwood Gambler, MD  SAVAYSA 30 MG TABS tablet TAKE 1 TABLET ONCE A DAY FOR AFIB Patient taking differently: Take 30 mg by mouth daily. 10/25/19  Yes Minus Breeding, MD  sertraline (ZOLOFT) 50 MG tablet Take 1 tablet (50 mg total) by mouth daily. 09/14/19  Yes Dettinger, Fransisca Kaufmann, MD  shark liver oil-cocoa butter (PREPARATION H) 0.25-88.44 % suppository Place 1 suppository rectally 2 (two) times daily.   Yes [provider]  tamsulosin (FLOMAX) 0.4 MG CAPS capsule TAKE (1) CAPSULE DAILY Patient taking differently: Take 0.4 mg by mouth daily. 08/30/19  Yes Dettinger, Fransisca Kaufmann, MD  TOPROL XL 100 MG 24 hr tablet Take 100 mg by mouth in the morning. Take with or immediately following a meal.   Yes [provider]  traZODone (DESYREL) 50 MG tablet TAKE 1/2 TO 1 TABLET AT BEDTIME AS NEEDED FOR SLEEP Patient taking differently: Take 25 mg by mouth at bedtime. 10/25/19  Yes Dettinger, Fransisca Kaufmann, MD  diltiazem (CARDIZEM CD) 240 MG 24 hr capsule TAKE (1) CAPSULE DAILY Patient not taking: No sig reported 06/07/19   Minus Breeding, MD  GNP MELATONIN MAXIMUM STRENGTH 5 MG TABS TAKE ONE TABLET AT BEDTIME Patient not taking: No sig reported 10/24/19   Dettinger, Fransisca Kaufmann, MD  metoprolol succinate (TOPROL-XL) 100 MG 24 hr tablet TAKE 1 TABLET EVERY DAY FOLLOWING  A MEAL Patient not taking: No sig reported 08/30/19   Dettinger, Fransisca Kaufmann, MD  phenylephrine (HEMORRHOIDAL) 0.25 % suppository Place 1 suppository rectally 2 (two) times daily. Patient not taking: Reported on 02/14/2020 08/31/19   Dettinger, Fransisca Kaufmann, MD    Inpatient Medications: Scheduled Meds: . apixaban  2.5 mg Oral BID  . [START  ON 02/17/2020] atorvastatin  40 mg Oral QODAY  . busPIRone  10 mg Oral BID  . calcium carbonate  1,250 mg Oral Q breakfast  . digoxin  0.0625 mg Oral Daily  . diltiazem  60 mg Oral Q6H  . lacosamide  100 mg Oral BID  . [START ON 02/17/2020] levothyroxine  75 mcg Oral Q0600  . metoprolol tartrate  12.5 mg Oral BID  . sertraline  50 mg Oral Daily  . sodium chloride flush  10-40 mL Intracatheter Q12H  . sodium polystyrene  15 g Oral Once  . tamsulosin  0.4 mg Oral Daily  . traZODone  25 mg Oral QHS   Continuous Infusions:  PRN Meds: acetaminophen **OR** acetaminophen (TYLENOL) oral liquid 160 mg/5 mL **OR** acetaminophen, LORazepam, senna-docusate, sodium chloride flush  Allergies:    Allergies  Allergen Reactions  . Penicillins Rash and Other (See Comments)    Has patient had a PCN reaction causing immediate rash, facial/tongue/throat swelling, SOB or lightheadedness with hypotension: No Has patient had a PCN reaction causing severe rash involving mucus membranes or skin necrosis: No Has patient had a PCN reaction that required hospitalization No Has patient had a PCN reaction occurring within the last 10 years: No If all of the above answers are "NO", then may proceed with Cephalosporin use.    Social History:   Social History   Socioeconomic History  . Marital status: Widowed    Spouse name: Not on file  . Number of children: 1  . Years of education: Not on file  . Highest education level: Not on file  Occupational History  . Occupation: retired    Comment: self - hair-dresser  Tobacco Use  . Smoking status: Never Smoker  . Smokeless  tobacco: Never Used  Vaping Use  . Vaping Use: Never used  Substance and Sexual Activity  . Alcohol use: No  . Drug use: No  . Sexual activity: Never  Other Topics Concern  . Not on file  Social History Narrative   Right handed    Split level. Only one step to go outside   Lives with sister   Social Determinants of Health   Financial Resource Strain: Not on file  Food Insecurity: Not on file  Transportation Needs: Not on file  Physical Activity: Not on file  Stress: Not on file  Social Connections: Not on file  Intimate Partner Violence: Not on file    Family History:    Family History  Problem Relation Age of Onset  . COPD Brother   . Atrial fibrillation Brother   . Diabetes Brother   . Liver cancer Brother        Alcohol-related  . Colon cancer Brother   . Heart attack Father   . Early death Father   . Heart disease Father   . Rheum arthritis Mother   . Arthritis Mother   . Heart disease Mother   . Coronary artery disease Brother   . Heart attack Daughter   . Gallbladder disease Brother      ROS:  Please see the history of present illness.  ROS  Unable to obtain a review of system from the patient with her dementia.    Physical Exam/Data:   Vitals:   02/16/20 0816 02/16/20 1154 02/16/20 1155 02/16/20 1230  BP: 113/75 123/83 118/83   Pulse: 98  97 94  Resp: 20  (!) 29 (!) 24  Temp: 98.3 F (36.8 C)  98.5 F (36.9 C)  TempSrc: Oral  Oral   SpO2: 97%  96% 95%  Weight:        Intake/Output Summary (Last 24 hours) at 02/16/2020 1542 Last data filed at 02/16/2020 1300 Gross per 24 hour  Intake 485.81 ml  Output -  Net 485.81 ml   Filed Weights   02/14/20 1600  Weight: 49.9 kg   Body mass index is 20.12 kg/m.  GENERAL: Chronically ill-appearing  HEENT:   Pupils equal round and reactive, fundi not visualized, oral mucosa unremarkable NECK:  No  jugular venous distention, waveform within normal limits, carotid upstroke brisk and symmetric, no  bruits, no thyromegaly LYMPHATICS:  No cervical, inguinal adenopathy LUNGS:   Clear to auscultation bilaterally BACK:  No CVA tenderness CHEST:   Unremarkable HEART:  PMI not displaced or sustained,S1 and S2 within normal limits, no S3, no clicks, no rubs, no murmurs, irregular ABD: Flat, positive bowel sounds normal in frequency in pitch, no bruits, no rebound, no guarding, no midline pulsatile mass, no hepatomegaly, no splenomegaly EXT:  2 plus pulses throughout, no  edema, no cyanosis no clubbing SKIN:  No rashes no nodules NEURO: Nonfocal PSYCH: Confused and oriented x1   EKG:  The EKG was personally reviewed and demonstrates:  Atrial fib, rate 140, QT prolonged, deep anterolateral T wave inversions new from previous.  Telemetry:  NA  Relevant CV Studies:  ECHO:  1. Left ventricular ejection fraction, by estimation, is 30 to 35%. The  left ventricle has moderately decreased function. The left ventricle  demonstrates regional wall motion abnormalities (see scoring  diagram/findings for description). There is mild  left ventricular hypertrophy. Left ventricular diastolic function could  not be evaluated. There is severe akinesis of the left ventricular, entire  anterior wall, anteroseptal wall, apical segment and inferoapical segment.  Findings suggest extensive LAD  territory ischemia/infarct or less likely stress-induced cardimyopathy.  The based of the heart is hyperdynamic. No mural thrombus with Definity  contrast.  2. Right ventricular systolic function is moderately reduced. The right  ventricular size is normal. There is mildly elevated pulmonary artery  systolic pressure. The estimated right ventricular systolic pressure is  16.1 mmHg.  3. Left atrial size was moderately dilated.  4. The mitral valve is abnormal. Mild mitral valve regurgitation.  5. The aortic valve is tricuspid. Aortic valve regurgitation is not  visualized. Mild aortic valve sclerosis is  present, with no evidence of  aortic valve stenosis.  6. The inferior vena cava is normal in size with <50% respiratory  variability, suggesting right atrial pressure of 8 mmHg.  Laboratory Data:  Chemistry Recent Labs  Lab 02/15/20 0536 02/15/20 2144 02/16/20 0602  NA 133* 135 136  K 5.2* 4.3 4.2  CL 98 103 104  CO2 17* 20* 20*  GLUCOSE 136* 128* 132*  BUN 20 21 21   CREATININE 0.69 0.78 0.83  CALCIUM 8.7* 8.4* 8.5*  GFRNONAA >60 >60 >60  ANIONGAP 18* 12 12    Recent Labs  Lab 02/14/20 1100  PROT 6.1*  ALBUMIN 3.1*  AST 26  ALT 22  ALKPHOS 117  BILITOT 0.6   Hematology Recent Labs  Lab 02/14/20 1100 02/14/20 1117  WBC 11.3*  --   RBC 4.62  --   HGB 14.4 15.0  HCT 45.2 44.0  MCV 97.8  --   MCH 31.2  --   MCHC 31.9  --   RDW 12.3  --   PLT 414*  --    Cardiac EnzymesNo results for  input(s): TROPONINI in the last 168 hours. No results for input(s): TROPIPOC in the last 168 hours.  BNPNo results for input(s): BNP, PROBNP in the last 168 hours.  DDimer No results for input(s): DDIMER in the last 168 hours.  Radiology/Studies:  CT Code Stroke CTA Head W/WO contrast  Result Date: 02/14/2020 CLINICAL DATA:  87 year old female code stroke presentation. Neurologic exam raise the possibility of left ACA or MCA territory insult. EXAM: CT ANGIOGRAPHY HEAD AND NECK TECHNIQUE: Multidetector CT imaging of the head and neck was performed using the standard protocol during bolus administration of intravenous contrast. Multiplanar CT image reconstructions and MIPs were obtained to evaluate the vascular anatomy. Carotid stenosis measurements (when applicable) are obtained utilizing NASCET criteria, using the distal internal carotid diameter as the denominator. CONTRAST:  150 mL OMNIPAQUE IOHEXOL 350 MG/ML SOLN COMPARISON:  Plain head CT 1119 hours today. CTA head and neck 03/01/2018. FINDINGS: The initial arterial bolus attempt was non-diagnostic due to streak artifact from left  shoulder arthroplasty interfering with the automatic contrast detection. But the repeat imaging attempt is of superior diagnostic quality. CTA NECK Skeleton: Chronically absent dentition. Cervical spine degeneration. Chronic left shoulder arthroplasty. No acute osseous abnormality identified. Upper chest: Small to moderate layering right pleural effusion. Otherwise stable, negative visible upper lungs. No superior mediastinal lymphadenopathy. Other neck: Stable.  No acute findings. Aortic arch: 3 vessel arch configuration. Chronic tortuosity of the thoracic aorta. Calcified arch atherosclerosis is stable. Right carotid system: Tortuous brachiocephalic artery and right CCA. Similar capacious right carotid bifurcation. Mild calcified plaque at the bifurcation with no stenosis. Tortuous right ICA is patent to the skull base. Left carotid system: Similar generalized left carotid ectasia/tortuosity with minimal plaque and no stenosis. Vertebral arteries: Mild plaque and tortuosity of the proximal right subclavian artery without stenosis. Superior visualization of the right vertebral artery origin today, normal. Minimal right V1 calcified plaque without stenosis. Mild right vertebral tortuosity to the skull base. Mild plaque in the proximal left subclavian artery without stenosis. Normal left vertebral artery origin. Tortuous left vertebral is patent to the skull base without stenosis. CTA HEAD Posterior circulation: Patent and normal codominant distal vertebral arteries to the basilar. Patent PICA origins. Patent basilar artery without irregularity or stenosis. Normal SCA and PCA origins. Posterior communicating arteries are diminutive or absent. Bilateral PCA branches are within normal limits. Mildly tortuous left P1. Anterior circulation: Both ICA siphons are patent with mild tortuosity but minimal plaque and no stenosis. Patent carotid termini. Normal MCA and ACA origins. Mildly tortuous A1 segments are patent.  Diminutive anterior communicating artery is normal. Bilateral ACA branches are stable and within normal limits. Left MCA M1 segment and trifurcation are patent without stenosis. Left MCA branches are stable and within normal limits. Right MCA M1, right MCA bifurcation, and right MCA branches are stable and within normal limits. Venous sinuses: Patent. Anatomic variants: None. Review of the MIP images confirms the above findings Preliminary results discussed by telephone with Dr. Kerney Elbe on 02/14/2020 at 1138 hours. IMPRESSION: 1. Negative for large vessel occlusion. Stable CTA head and neck since 2020, positive for generalized arterial tortuosity, but with minimal atherosclerosis for age and no arterial stenosis. 2. Small to moderate layering right pleural effusion. 3. Aortic Atherosclerosis (ICD10-I70.0). Preliminary results discussed by telephone with Dr. Kerney Elbe on 02/14/2020 at 1138 hours. Electronically Signed   By: Genevie [name] M.D.   On: 02/14/2020 11:53   CT Code Stroke CTA Neck W/WO contrast  Result Date: 02/14/2020  CLINICAL DATA:  87 year old female code stroke presentation. Neurologic exam raise the possibility of left ACA or MCA territory insult. EXAM: CT ANGIOGRAPHY HEAD AND NECK TECHNIQUE: Multidetector CT imaging of the head and neck was performed using the standard protocol during bolus administration of intravenous contrast. Multiplanar CT image reconstructions and MIPs were obtained to evaluate the vascular anatomy. Carotid stenosis measurements (when applicable) are obtained utilizing NASCET criteria, using the distal internal carotid diameter as the denominator. CONTRAST:  150 mL OMNIPAQUE IOHEXOL 350 MG/ML SOLN COMPARISON:  Plain head CT 1119 hours today. CTA head and neck 03/01/2018. FINDINGS: The initial arterial bolus attempt was non-diagnostic due to streak artifact from left shoulder arthroplasty interfering with the automatic contrast detection. But the repeat imaging attempt is of  superior diagnostic quality. CTA NECK Skeleton: Chronically absent dentition. Cervical spine degeneration. Chronic left shoulder arthroplasty. No acute osseous abnormality identified. Upper chest: Small to moderate layering right pleural effusion. Otherwise stable, negative visible upper lungs. No superior mediastinal lymphadenopathy. Other neck: Stable.  No acute findings. Aortic arch: 3 vessel arch configuration. Chronic tortuosity of the thoracic aorta. Calcified arch atherosclerosis is stable. Right carotid system: Tortuous brachiocephalic artery and right CCA. Similar capacious right carotid bifurcation. Mild calcified plaque at the bifurcation with no stenosis. Tortuous right ICA is patent to the skull base. Left carotid system: Similar generalized left carotid ectasia/tortuosity with minimal plaque and no stenosis. Vertebral arteries: Mild plaque and tortuosity of the proximal right subclavian artery without stenosis. Superior visualization of the right vertebral artery origin today, normal. Minimal right V1 calcified plaque without stenosis. Mild right vertebral tortuosity to the skull base. Mild plaque in the proximal left subclavian artery without stenosis. Normal left vertebral artery origin. Tortuous left vertebral is patent to the skull base without stenosis. CTA HEAD Posterior circulation: Patent and normal codominant distal vertebral arteries to the basilar. Patent PICA origins. Patent basilar artery without irregularity or stenosis. Normal SCA and PCA origins. Posterior communicating arteries are diminutive or absent. Bilateral PCA branches are within normal limits. Mildly tortuous left P1. Anterior circulation: Both ICA siphons are patent with mild tortuosity but minimal plaque and no stenosis. Patent carotid termini. Normal MCA and ACA origins. Mildly tortuous A1 segments are patent. Diminutive anterior communicating artery is normal. Bilateral ACA branches are stable and within normal limits. Left  MCA M1 segment and trifurcation are patent without stenosis. Left MCA branches are stable and within normal limits. Right MCA M1, right MCA bifurcation, and right MCA branches are stable and within normal limits. Venous sinuses: Patent. Anatomic variants: None. Review of the MIP images confirms the above findings Preliminary results discussed by telephone with Dr. Kerney Elbe on 02/14/2020 at 1138 hours. IMPRESSION: 1. Negative for large vessel occlusion. Stable CTA head and neck since 2020, positive for generalized arterial tortuosity, but with minimal atherosclerosis for age and no arterial stenosis. 2. Small to moderate layering right pleural effusion. 3. Aortic Atherosclerosis (ICD10-I70.0). Preliminary results discussed by telephone with Dr. Kerney Elbe on 02/14/2020 at 1138 hours. Electronically Signed   By: Genevie [name] M.D.   On: 02/14/2020 11:53   CT Cervical Spine Wo Contrast  Result Date: 02/14/2020 CLINICAL DATA:  Code stroke.  87 year old female.  Neck trauma. EXAM: CT CERVICAL SPINE WITHOUT CONTRAST TECHNIQUE: Multidetector CT imaging of the cervical spine was performed without intravenous contrast. Multiplanar CT image reconstructions were also generated. COMPARISON:  CT cervical spine 02/18/2009. FINDINGS: Alignment: Improved cervical lordosis from 2011. Mild degenerative appearing anterolisthesis of C3 on  C4, associated with chronic right side facet arthropathy. Similar anterolisthesis of C7 on T1 with bilateral facet hypertrophy. Skull base and vertebrae: Visualized skull base is intact. No atlanto-occipital dissociation. Osteopenia. C1-C2 appear intact, although with mild to moderate joint space loss on the right. Normal C1-C2 alignment for the degree of head rotation. No acute osseous abnormality identified. Soft tissues and spinal canal: No prevertebral fluid or swelling. No visible canal hematoma. Calcified carotid atherosclerosis in the neck. Disc levels: Widespread cervical spine degeneration  but only mild cervical spinal stenosis suspected. Upper chest: Negative. Other: Head CT reported separately. IMPRESSION: 1. No acute traumatic injury identified in the cervical spine. 2. Osteopenia.  Progressed cervical spine degeneration since 2011. Electronically Signed   By: Genevie [name] M.D.   On: 02/14/2020 11:29   MR BRAIN WO CONTRAST  Result Date: 02/15/2020 EXAM: MRI HEAD WITHOUT CONTRAST TECHNIQUE: Multiplanar, multiecho pulse sequences of the brain and surrounding structures were obtained without intravenous contrast. COMPARISON:  MRI March 03, 2018. FINDINGS: Brain: Multiple cortical and subcortical acute infarcts in the high left frontoparietal region. Additional punctate infarcts in the high right parietal lobe and left occipital lobe, small infarct in left caudate head, and small cortical infarct in the high right frontal lobe. Mild edema without mass effect. No midline shift. Basal cisterns are patent. No evidence of acute hemorrhage. Additional advanced scattered T2/FLAIR hyperintensities within the white matter, most likely related to chronic microvascular ischemic disease. Generalized cerebral volume loss with ex vacuo ventricular dilation. Multiple small foci of susceptibility artifact involving bilateral cerebellar hemispheres and right temporal lobe, likely the sequela of prior microhemorrhage. No extra-axial fluid collection. Vascular: Major arterial flow voids are maintained at the skull base. Better evaluated on recent CTA, Skull and upper cervical spine: Normal marrow signal. Sinuses/Orbits: Sinuses are largely clear.  Unremarkable orbits. Other: Small left and trace right mastoid effusions. IMPRESSION: 1. Multiple small acute infarcts involving bilateral frontoparietal lobes, left occipital lobe, and the left caudate. The high left frontoparietal region is most involved. Mild edema without mass effect. Involvement of multiple vascular territories is concerning for a central embolic  etiology. 2. Advanced chronic microvascular ischemic disease. 3. Bilateral cerebellar and right temporal lobe prior microhemorrhages, possibly the sequela of hypertension, amyloid angiopathy, or emboli. These results will be called to the ordering clinician or representative by the Radiologist Assistant, and communication documented in the PACS or [employer]. Electronically Signed   By: Margaretha Sheffield MD   On: 02/15/2020 11:02   DG Chest Portable 1 View  Result Date: 02/14/2020 CLINICAL DATA:  Fall. EXAM: PORTABLE CHEST 1 VIEW COMPARISON:  March 04, 2018. FINDINGS: Stable cardiomegaly. No pneumothorax or pleural effusion is noted. Both lungs are clear. Status post left total shoulder arthroplasty. Old right rib fractures are noted. IMPRESSION: No active disease. Electronically Signed   By: Marijo Conception M.D.   On: 02/14/2020 12:06   EEG adult  Result Date: 02/15/2020 Lora Havens, MD     02/15/2020  3:30 PM Patient Name: [name] MRN: [id number] Epilepsy Attending: Lora Havens Referring Physician/Provider: Dr Rosalin Hawking Date: 02/15/2020 Duration: 25.37 mins Patient history: 87yo F with h/o seizure presented after fall and ams. EEG to evaluate for seizure Level of alertness: Awake, asleep AEDs during EEG study: PHT Technical aspects: This EEG study was done with scalp electrodes positioned according to the 10-20 International system of electrode placement. Electrical activity was acquired at a sampling rate of 500Hz  and reviewed with a  high frequency filter of 70Hz  and a low frequency filter of 1Hz . EEG data were recorded continuously and digitally stored. Description: No clear posterior dominant rhythm was seen. Sleep was characterized by sleep spindles (12 to 14 Hz), maximal frontocentral region. EEG showed continuous generalized and maximal left frontal region 3 to 6 Hz theta-delta slowing. Hyperventilation and photic stimulation were not performed.   ABNORMALITY -Continuous  slow, generalized and maximal left frontal region IMPRESSION: This study is suggestive of cortical dysfunction in left frontal region likely due to underlying stroke. There is also moderate diffuse encephalopathy, nonspecific etiology. No seizures or epileptiform discharges were seen throughout the recording. Lora Havens   ECHOCARDIOGRAM COMPLETE  Result Date: 02/15/2020    ECHOCARDIOGRAM REPORT   Patient Name:   [name] Date of Exam: 02/15/2020 Medical Rec #:  [id number]      Height:       62.0 in Accession #:    [id number]     Weight:       110.0 lb Date of Birth:  1932-12-30      BSA:          1.483 m Patient Age:    49 years       BP:           101/74 mmHg Patient Gender: F              HR:           82 bpm. Exam Location:  Inpatient Procedure: 2D Echo and Intracardiac Opacification Agent Indications:    TIA  History:        Patient has prior history of Echocardiogram examinations, most                 recent 01/31/2018. CHF, TIA, Arrythmias:Atrial Fibrillation; Risk                 Factors:Hypertension.  Sonographer:    Johny Chess Referring Phys: [id number] [place]  1. Left ventricular ejection fraction, by estimation, is 30 to 35%. The left ventricle has moderately decreased function. The left ventricle demonstrates regional wall motion abnormalities (see scoring diagram/findings for description). There is mild left ventricular hypertrophy. Left ventricular diastolic function could not be evaluated. There is severe akinesis of the left ventricular, entire anterior wall, anteroseptal wall, apical segment and inferoapical segment. Findings suggest extensive LAD territory ischemia/infarct or less likely stress-induced cardimyopathy. The based of the heart is hyperdynamic. No mural thrombus with Definity contrast.  2. Right ventricular systolic function is moderately reduced. The right ventricular size is normal. There is mildly elevated pulmonary artery systolic pressure. The  estimated right ventricular systolic pressure is 14.9 mmHg.  3. Left atrial size was moderately dilated.  4. The mitral valve is abnormal. Mild mitral valve regurgitation.  5. The aortic valve is tricuspid. Aortic valve regurgitation is not visualized. Mild aortic valve sclerosis is present, with no evidence of aortic valve stenosis.  6. The inferior vena cava is normal in size with <50% respiratory variability, suggesting right atrial pressure of 8 mmHg. Comparison(s): Changes from prior study are noted. 01/31/2018: LVEF 60-65%. FINDINGS  Left Ventricle: Left ventricular ejection fraction, by estimation, is 30 to 35%. The left ventricle has moderately decreased function. The left ventricle demonstrates regional wall motion abnormalities. Severe akinesis of the left ventricular, entire anterior wall, anteroseptal wall, apical segment and inferoapical segment. Definity contrast agent was given IV to delineate the left ventricular endocardial borders. The left ventricular internal  cavity size was normal in size. There is mild left ventricular hypertrophy. Left ventricular diastolic function could not be evaluated due to atrial fibrillation. Left ventricular diastolic function could not be evaluated. Right Ventricle: The right ventricular size is normal. No increase in right ventricular wall thickness. Right ventricular systolic function is moderately reduced. There is mildly elevated pulmonary artery systolic pressure. The tricuspid regurgitant velocity is 2.85 m/s, and with an assumed right atrial pressure of 8 mmHg, the estimated right ventricular systolic pressure is 96.2 mmHg. Left Atrium: Left atrial size was moderately dilated. Right Atrium: Right atrial size was normal in size. Pericardium: There is no evidence of pericardial effusion. Mitral Valve: The mitral valve is abnormal. There is moderate thickening of the mitral valve leaflet(s). Mild mitral valve regurgitation. Tricuspid Valve: The tricuspid valve is  grossly normal. Tricuspid valve regurgitation is mild. Aortic Valve: The aortic valve is tricuspid. Aortic valve regurgitation is not visualized. Mild aortic valve sclerosis is present, with no evidence of aortic valve stenosis. Pulmonic Valve: The pulmonic valve was grossly normal. Pulmonic valve regurgitation is trivial. Aorta: The aortic root and ascending aorta are structurally normal, with no evidence of dilitation. Venous: The inferior vena cava is normal in size with less than 50% respiratory variability, suggesting right atrial pressure of 8 mmHg. IAS/Shunts: No atrial level shunt detected by color flow Doppler.  LEFT VENTRICLE PLAX 2D LVIDd:         3.80 cm LVIDs:         3.10 cm LV PW:         1.00 cm LV IVS:        1.10 cm LVOT diam:     1.60 cm LV SV:         16 LV SV Index:   11 LVOT Area:     2.01 cm  RIGHT VENTRICLE            IVC RV S prime:     6.83 cm/s  IVC diam: 1.90 cm TAPSE (M-mode): 1.1 cm LEFT ATRIUM             Index       RIGHT ATRIUM           Index LA diam:        4.30 cm 2.90 cm/m  RA Area:     16.10 cm LA Vol (A2C):   42.9 ml 28.93 ml/m RA Volume:   43.10 ml  29.07 ml/m LA Vol (A4C):   60.2 ml 40.60 ml/m LA Biplane Vol: 54.1 ml 36.49 ml/m  AORTIC VALVE LVOT Vmax:   56.80 cm/s LVOT Vmean:  36.400 cm/s LVOT VTI:    0.082 m  AORTA Ao Root diam: 2.60 cm Ao Asc diam:  3.00 cm TRICUSPID VALVE TR Peak grad:   32.5 mmHg TR Vmax:        285.00 cm/s  SHUNTS Systemic VTI:  0.08 m Systemic Diam: 1.60 cm Lyman Bishop MD Electronically signed by Lyman Bishop MD Signature Date/Time: 02/15/2020/5:33:43 PM    Final    CT HEAD CODE STROKE WO CONTRAST  Result Date: 02/14/2020 CLINICAL DATA:  Code stroke.  87 year old female. EXAM: CT HEAD WITHOUT CONTRAST TECHNIQUE: Contiguous axial images were obtained from the base of the skull through the vertex without intravenous contrast. COMPARISON:  Brain MRI 03/03/2018.  Head CT 11/14/2019. FINDINGS: Brain: Stable cerebral volume since last year. No  midline shift, ventriculomegaly, mass effect, evidence of mass lesion, intracranial hemorrhage or evidence of cortically based  acute infarction. Patchy and confluent bilateral cerebral white matter hypodensity is stable. No cortical encephalomalacia identified. Vascular: Calcified atherosclerosis at the skull base. No suspicious intracranial vascular hyperdensity. Skull: Stable.  No acute osseous abnormality identified. Sinuses/Orbits: Chronic left mastoid effusion. Other Visualized paranasal sinuses and mastoids are stable and well pneumatized. Other: Stable orbit and scalp soft tissues. ASPECTS [facility] Stroke Program Early CT Score) Total score (0-10 with 10 being normal): 10 IMPRESSION: 1. Stable non contrast CT appearance of the brain. Advanced white matter disease. ASPECTS 10. 2. These results were communicated to Dr. Cheral Marker at 11:24 am on 02/14/2020 by text page via the [facility] messaging system. Electronically Signed   By: Genevie [name] M.D.   On: 02/14/2020 11:25    Assessment and Plan:   ATRIAL FIB: She is on anticoagulation.  I will be increasing her beta-blocker and discontinuing the Cardizem before discharge.  On a very low dose of digoxin.  I will likely discontinue this before discharge.  She is on metoprolol.  HTN: This will be managed in the context of managing her cardiomyopathy.  CARDIOMYOPATHY:  She does have reduced EF which is new.  The appearance of the EKG is very much consistent with a Takotsubo's cardiomyopathy.  Regardless I did speak with her son.  He agrees with conservative management.  We would not pursue cardiac catheterization.  She is not having any acute complaints.  I will maximize beta-blocker and consider adding an ACE inhibitor if possible in the future.  Right now she seems to be euvolemic.  I have note I will cycle troponins to see if I can determine the acuity but he will not change therapy.  For questions or updates, please contact [place] Please consult  [email or web address] for contact info under Cardiology/STEMI.   Signed, Minus Breeding, MD  02/16/2020 3:42 PM

## 2020-02-16 NOTE — Progress Notes (Signed)
  Speech Language Pathology Treatment: Dysphagia  Patient Details Name: [name] MRN: [id number] DOB: 22-Oct-1932 Today's Date: 02/16/2020 Time: 0350-0938 SLP Time Calculation (min) (ACUTE ONLY): 20 min  Assessment / Plan / Recommendation Clinical Impression  Ongoing diagnostic dysphagia treatment provided today with noted improved alertness; Pt responded verbally to questions and needed no additional cues for remaining alert throughout session. Oral care provided cleansing lingual surface of white coating and upper and lower dentures donned. Pt demonstrated overt and immediate coughing with trials of thin liquids, however, positioning is sub-optimal with cervical collar limiting Pt's ability to self feed and Pt turning her head upward and to the side to receive PO. Pt consumed NTL without s/s of dysphagia. She consumed puree and soft textures without incident; Pt's dentures are loosely fitting and Pt reports difficulty masticating tough solid foods. Recommend initiate D2/fine chop diet and NECTAR thick liquids, continue to crush meds in puree. Anticipate as alertness continues and Collar is removed allowing improved positioning that subjective upgrade may be indicated; however, ST will also follow for potential need for instrumental testing.   HPI HPI: [name] is an 87 y.o. female past medical history significant for advanced dementia, chronic A. fib, hypothyroidism, GERD, stroke, dysphagia is brought in for strokelike symptoms. CT no acute process. MRI revealed Multiple small acute infarcts involving bilateral frontoparietal lobes, left occipital lobe, and the left caudate. The high left frontoparietal region is most involved. Mild edema without mass  effect. Involvement of multiple vascular territories is concerning for a central embolic etiology, advanced chronic microvascular ischemic disease, bilateral cerebellar and right temporal lobe prior microhemorrhages, possibly the sequela of  hypertension, amyloid angiopathy, or emboli. BSE at [facility] 03/02/18 NPO, MBS 03/03/18 min oral discoordination, otherwise normal study for age, esophageal scan unremarkable and reg/thin recommended.      SLP Plan  Continue with current plan of care       Recommendations  Diet recommendations: Dysphagia 2 (fine chop);Nectar-thick liquid Liquids provided via: Cup;Straw;Teaspoon Medication Administration: Crushed with puree Supervision: Staff to assist with self feeding;Intermittent supervision to cue for compensatory strategies Compensations: Minimize environmental distractions;Slow rate Postural Changes and/or Swallow Maneuvers: Seated upright 90 degrees                Oral Care Recommendations: Oral care BID Follow up Recommendations: Skilled Nursing facility SLP Visit Diagnosis: Dysphagia, unspecified (R13.10) Plan: Continue with current plan of care       Maliah Pyles H. Roddie Mc, CCC-SLP Speech Language Pathologist   Wende Bushy 02/16/2020, 11:42 AM

## 2020-02-16 NOTE — Progress Notes (Signed)
[facility] PROGRESS NOTE    Progress Note  [name]  BWG:[id number] DOB: Apr 03, 1932 DOA: 02/14/2020 PCP: Dettinger, Fransisca Kaufmann, MD     Brief Narrative:   [name] is an 87 y.o. female past medical history significant for advanced dementia, chronic A. fib on systemic anticoagulation, hypothyroidism is brought in by EMS for strokelike symptoms.  In the ED she was found in A. fib started on Cardizem drip.  Significant Events: 02/14/2020 admission  Significant studies: 02/14/2020 CT of the head showed no acute findings. 02/14/2020 CTA was negative for large caliber occlusion 02/14/2020 MRI of the brain showed multiple small acute infarcts involving the bilateral frontal parietal left occipital lobe with edema without mass-effect, advanced chronic microvascular ischemic changes bilateral cerebral microhemorrhage 2D echo an EF of 30% with severe akinesia of the left ventricular entire anterior wall, anteroseptal wall and apical segment.  Reduced right ventricular systolic function moderately reduced with a PA pressure of 40 mmHg.  Bilateral atrial enlargement.  Antibiotics: None  Microbiology data: Blood culture:  Procedures: None  Assessment/Plan:   CVA: With right-sided weakness and left facial droop. HgbA1c, fasting lipid panel HDL greater than 40 LDL greater than 100 started on Crestor MRI, MRA of the brain without showed multiple scattered stroke concern for embolic phenomenon. PT, OT recommended skilled nursing facility.   Speech evaluated the patient recommended n.p.o. except for meds due to the patient's mentation.  They can reevaluate today. 2D echo was done that showed significant wall motion abnormality. She has been switched to oral Eliquis. BP goal: permissive HTN upto 220/120 mmHg  A. fib with RVR: She is now been transitioned to oral diltiazem with a good heart rate control on diltiazem, metoprolol and digoxin. Continue Eliquis.  New wall motion  abnormality: 2D echo showed a reduced EF with wall motion abnormality and elevated PA pressure. She has advanced micro vascular changes with some micro hemorrhagic changes in the cerebellar bilaterally. We will consult cardiology to see if she is a candidate for an ischemic work-up.  Chronic diastolic CHF (congestive heart failure) ([place]) Noted she appears euvolemic hold diuretic therapy.  And antihypertensive medications to allow permissive hypertension.  Essential hypertension: Well-controlled, continue diltiazem hold all other antihypertensive medication.  Seizure disorder Neurology recommended to discontinue Dilantin and start her on Vimpat.  Which she has been tolerating well. Continue Ativan as needed for possible seizures.    DVT prophylaxis: DOAC Family Communication:daughter Status is: Inpatient  Remains inpatient appropriate because:Hemodynamically unstable   Dispo: The patient is from: SNF              Anticipated d/c is to: SNF              Anticipated d/c date is: 2 days              Patient currently is not medically stable to d/c.   Difficult to place patient No Code Status:     Code Status Orders  (From admission, onward)         Start     Ordered   02/14/20 1332  Limited resuscitation (code)  Continuous       Question Answer Comment  In the event of cardiac or respiratory ARREST: Initiate Code Blue, Call Rapid Response Yes   In the event of cardiac or respiratory ARREST: Perform CPR No   In the event of cardiac or respiratory ARREST: Perform Intubation/Mechanical Ventilation No   In the event of cardiac or respiratory ARREST:  Use NIPPV/BiPAp only if indicated Yes   In the event of cardiac or respiratory ARREST: Administer ACLS medications if indicated Yes   In the event of cardiac or respiratory ARREST: Perform Defibrillation or Cardioversion if indicated Yes      02/14/20 1333        Code Status History    Date Active Date Inactive Code Status  Order ID Comments User Context   03/01/2018 1949 03/08/2018 0028 Partial Code [id number]  Bethena Roys, MD Inpatient   01/30/2018 1849 02/03/2018 1726 Full Code [id number]  Cristy Folks, MD Inpatient   03/15/2017 1612 03/17/2017 1713 Full Code [id number]  Phillips Grout, MD ED   01/28/2017 0304 01/28/2017 1933 Full Code [id number]  Vianne Bulls, MD ED   11/13/2016 0549 11/13/2016 1704 Full Code [id number]  Jani Gravel, MD Inpatient   06/22/2016 1337 06/22/2016 2104 Full Code [id number]  Troy Sine, MD Inpatient   04/23/2016 1543 04/26/2016 1934 DNR [id number]  Eber Jones, MD Inpatient   04/23/2016 1543 04/23/2016 1543 DNR [id number]  Eber Jones, MD Inpatient   09/29/2015 0215 09/29/2015 2119 Full Code [id number]  Oswald Hillock, MD Inpatient   04/21/2015 1506 04/22/2015 1811 Full Code [id number]  Orvan Falconer, MD ED   01/12/2015 0052 01/16/2015 1512 Full Code [id number]  Edwin Dada, MD Inpatient   11/17/2010 1810 11/20/2010 1310 Full Code [id number]  Marcy Panning, RN Inpatient   Advance Care Planning Activity        IV Access:    Peripheral IV   Procedures and diagnostic studies:   CT Code Stroke CTA Head W/WO contrast  Result Date: 02/14/2020 CLINICAL DATA:  87 year old female code stroke presentation. Neurologic exam raise the possibility of left ACA or MCA territory insult. EXAM: CT ANGIOGRAPHY HEAD AND NECK TECHNIQUE: Multidetector CT imaging of the head and neck was performed using the standard protocol during bolus administration of intravenous contrast. Multiplanar CT image reconstructions and MIPs were obtained to evaluate the vascular anatomy. Carotid stenosis measurements (when applicable) are obtained utilizing NASCET criteria, using the distal internal carotid diameter as the denominator. CONTRAST:  150 mL OMNIPAQUE IOHEXOL 350 MG/ML SOLN COMPARISON:  Plain head CT 1119 hours today. CTA head and neck 03/01/2018. FINDINGS: The initial arterial bolus attempt  was non-diagnostic due to streak artifact from left shoulder arthroplasty interfering with the automatic contrast detection. But the repeat imaging attempt is of superior diagnostic quality. CTA NECK Skeleton: Chronically absent dentition. Cervical spine degeneration. Chronic left shoulder arthroplasty. No acute osseous abnormality identified. Upper chest: Small to moderate layering right pleural effusion. Otherwise stable, negative visible upper lungs. No superior mediastinal lymphadenopathy. Other neck: Stable.  No acute findings. Aortic arch: 3 vessel arch configuration. Chronic tortuosity of the thoracic aorta. Calcified arch atherosclerosis is stable. Right carotid system: Tortuous brachiocephalic artery and right CCA. Similar capacious right carotid bifurcation. Mild calcified plaque at the bifurcation with no stenosis. Tortuous right ICA is patent to the skull base. Left carotid system: Similar generalized left carotid ectasia/tortuosity with minimal plaque and no stenosis. Vertebral arteries: Mild plaque and tortuosity of the proximal right subclavian artery without stenosis. Superior visualization of the right vertebral artery origin today, normal. Minimal right V1 calcified plaque without stenosis. Mild right vertebral tortuosity to the skull base. Mild plaque in the proximal left subclavian artery without stenosis. Normal left vertebral artery origin. Tortuous left vertebral is patent to the skull base without stenosis. CTA HEAD Posterior circulation: Patent and normal codominant distal  vertebral arteries to the basilar. Patent PICA origins. Patent basilar artery without irregularity or stenosis. Normal SCA and PCA origins. Posterior communicating arteries are diminutive or absent. Bilateral PCA branches are within normal limits. Mildly tortuous left P1. Anterior circulation: Both ICA siphons are patent with mild tortuosity but minimal plaque and no stenosis. Patent carotid termini. Normal MCA and ACA  origins. Mildly tortuous A1 segments are patent. Diminutive anterior communicating artery is normal. Bilateral ACA branches are stable and within normal limits. Left MCA M1 segment and trifurcation are patent without stenosis. Left MCA branches are stable and within normal limits. Right MCA M1, right MCA bifurcation, and right MCA branches are stable and within normal limits. Venous sinuses: Patent. Anatomic variants: None. Review of the MIP images confirms the above findings Preliminary results discussed by telephone with Dr. Kerney Elbe on 02/14/2020 at 1138 hours. IMPRESSION: 1. Negative for large vessel occlusion. Stable CTA head and neck since 2020, positive for generalized arterial tortuosity, but with minimal atherosclerosis for age and no arterial stenosis. 2. Small to moderate layering right pleural effusion. 3. Aortic Atherosclerosis (ICD10-I70.0). Preliminary results discussed by telephone with Dr. Kerney Elbe on 02/14/2020 at 1138 hours. Electronically Signed   By: Genevie [name] M.D.   On: 02/14/2020 11:53   CT Code Stroke CTA Neck W/WO contrast  Result Date: 02/14/2020 CLINICAL DATA:  87 year old female code stroke presentation. Neurologic exam raise the possibility of left ACA or MCA territory insult. EXAM: CT ANGIOGRAPHY HEAD AND NECK TECHNIQUE: Multidetector CT imaging of the head and neck was performed using the standard protocol during bolus administration of intravenous contrast. Multiplanar CT image reconstructions and MIPs were obtained to evaluate the vascular anatomy. Carotid stenosis measurements (when applicable) are obtained utilizing NASCET criteria, using the distal internal carotid diameter as the denominator. CONTRAST:  150 mL OMNIPAQUE IOHEXOL 350 MG/ML SOLN COMPARISON:  Plain head CT 1119 hours today. CTA head and neck 03/01/2018. FINDINGS: The initial arterial bolus attempt was non-diagnostic due to streak artifact from left shoulder arthroplasty interfering with the automatic contrast  detection. But the repeat imaging attempt is of superior diagnostic quality. CTA NECK Skeleton: Chronically absent dentition. Cervical spine degeneration. Chronic left shoulder arthroplasty. No acute osseous abnormality identified. Upper chest: Small to moderate layering right pleural effusion. Otherwise stable, negative visible upper lungs. No superior mediastinal lymphadenopathy. Other neck: Stable.  No acute findings. Aortic arch: 3 vessel arch configuration. Chronic tortuosity of the thoracic aorta. Calcified arch atherosclerosis is stable. Right carotid system: Tortuous brachiocephalic artery and right CCA. Similar capacious right carotid bifurcation. Mild calcified plaque at the bifurcation with no stenosis. Tortuous right ICA is patent to the skull base. Left carotid system: Similar generalized left carotid ectasia/tortuosity with minimal plaque and no stenosis. Vertebral arteries: Mild plaque and tortuosity of the proximal right subclavian artery without stenosis. Superior visualization of the right vertebral artery origin today, normal. Minimal right V1 calcified plaque without stenosis. Mild right vertebral tortuosity to the skull base. Mild plaque in the proximal left subclavian artery without stenosis. Normal left vertebral artery origin. Tortuous left vertebral is patent to the skull base without stenosis. CTA HEAD Posterior circulation: Patent and normal codominant distal vertebral arteries to the basilar. Patent PICA origins. Patent basilar artery without irregularity or stenosis. Normal SCA and PCA origins. Posterior communicating arteries are diminutive or absent. Bilateral PCA branches are within normal limits. Mildly tortuous left P1. Anterior circulation: Both ICA siphons are patent with mild tortuosity but minimal plaque and no stenosis. Patent  carotid termini. Normal MCA and ACA origins. Mildly tortuous A1 segments are patent. Diminutive anterior communicating artery is normal. Bilateral ACA  branches are stable and within normal limits. Left MCA M1 segment and trifurcation are patent without stenosis. Left MCA branches are stable and within normal limits. Right MCA M1, right MCA bifurcation, and right MCA branches are stable and within normal limits. Venous sinuses: Patent. Anatomic variants: None. Review of the MIP images confirms the above findings Preliminary results discussed by telephone with Dr. Kerney Elbe on 02/14/2020 at 1138 hours. IMPRESSION: 1. Negative for large vessel occlusion. Stable CTA head and neck since 2020, positive for generalized arterial tortuosity, but with minimal atherosclerosis for age and no arterial stenosis. 2. Small to moderate layering right pleural effusion. 3. Aortic Atherosclerosis (ICD10-I70.0). Preliminary results discussed by telephone with Dr. Kerney Elbe on 02/14/2020 at 1138 hours. Electronically Signed   By: Genevie [name] M.D.   On: 02/14/2020 11:53   CT Cervical Spine Wo Contrast  Result Date: 02/14/2020 CLINICAL DATA:  Code stroke.  87 year old female.  Neck trauma. EXAM: CT CERVICAL SPINE WITHOUT CONTRAST TECHNIQUE: Multidetector CT imaging of the cervical spine was performed without intravenous contrast. Multiplanar CT image reconstructions were also generated. COMPARISON:  CT cervical spine 02/18/2009. FINDINGS: Alignment: Improved cervical lordosis from 2011. Mild degenerative appearing anterolisthesis of C3 on C4, associated with chronic right side facet arthropathy. Similar anterolisthesis of C7 on T1 with bilateral facet hypertrophy. Skull base and vertebrae: Visualized skull base is intact. No atlanto-occipital dissociation. Osteopenia. C1-C2 appear intact, although with mild to moderate joint space loss on the right. Normal C1-C2 alignment for the degree of head rotation. No acute osseous abnormality identified. Soft tissues and spinal canal: No prevertebral fluid or swelling. No visible canal hematoma. Calcified carotid atherosclerosis in the neck.  Disc levels: Widespread cervical spine degeneration but only mild cervical spinal stenosis suspected. Upper chest: Negative. Other: Head CT reported separately. IMPRESSION: 1. No acute traumatic injury identified in the cervical spine. 2. Osteopenia.  Progressed cervical spine degeneration since 2011. Electronically Signed   By: Genevie [name] M.D.   On: 02/14/2020 11:29   MR BRAIN WO CONTRAST  Result Date: 02/15/2020 EXAM: MRI HEAD WITHOUT CONTRAST TECHNIQUE: Multiplanar, multiecho pulse sequences of the brain and surrounding structures were obtained without intravenous contrast. COMPARISON:  MRI March 03, 2018. FINDINGS: Brain: Multiple cortical and subcortical acute infarcts in the high left frontoparietal region. Additional punctate infarcts in the high right parietal lobe and left occipital lobe, small infarct in left caudate head, and small cortical infarct in the high right frontal lobe. Mild edema without mass effect. No midline shift. Basal cisterns are patent. No evidence of acute hemorrhage. Additional advanced scattered T2/FLAIR hyperintensities within the white matter, most likely related to chronic microvascular ischemic disease. Generalized cerebral volume loss with ex vacuo ventricular dilation. Multiple small foci of susceptibility artifact involving bilateral cerebellar hemispheres and right temporal lobe, likely the sequela of prior microhemorrhage. No extra-axial fluid collection. Vascular: Major arterial flow voids are maintained at the skull base. Better evaluated on recent CTA, Skull and upper cervical spine: Normal marrow signal. Sinuses/Orbits: Sinuses are largely clear.  Unremarkable orbits. Other: Small left and trace right mastoid effusions. IMPRESSION: 1. Multiple small acute infarcts involving bilateral frontoparietal lobes, left occipital lobe, and the left caudate. The high left frontoparietal region is most involved. Mild edema without mass effect. Involvement of multiple vascular  territories is concerning for a central embolic etiology. 2. Advanced chronic microvascular ischemic  disease. 3. Bilateral cerebellar and right temporal lobe prior microhemorrhages, possibly the sequela of hypertension, amyloid angiopathy, or emboli. These results will be called to the ordering clinician or representative by the Radiologist Assistant, and communication documented in the PACS or [employer]. Electronically Signed   By: Margaretha Sheffield MD   On: 02/15/2020 11:02   DG Chest Portable 1 View  Result Date: 02/14/2020 CLINICAL DATA:  Fall. EXAM: PORTABLE CHEST 1 VIEW COMPARISON:  March 04, 2018. FINDINGS: Stable cardiomegaly. No pneumothorax or pleural effusion is noted. Both lungs are clear. Status post left total shoulder arthroplasty. Old right rib fractures are noted. IMPRESSION: No active disease. Electronically Signed   By: Marijo Conception M.D.   On: 02/14/2020 12:06   EEG adult  Result Date: 02/15/2020 Lora Havens, MD     02/15/2020  3:30 PM Patient Name: [name] MRN: [id number] Epilepsy Attending: Lora Havens Referring Physician/Provider: Dr Rosalin Hawking Date: 02/15/2020 Duration: 25.37 mins Patient history: 87yo F with h/o seizure presented after fall and ams. EEG to evaluate for seizure Level of alertness: Awake, asleep AEDs during EEG study: PHT Technical aspects: This EEG study was done with scalp electrodes positioned according to the 10-20 International system of electrode placement. Electrical activity was acquired at a sampling rate of 500Hz  and reviewed with a high frequency filter of 70Hz  and a low frequency filter of 1Hz . EEG data were recorded continuously and digitally stored. Description: No clear posterior dominant rhythm was seen. Sleep was characterized by sleep spindles (12 to 14 Hz), maximal frontocentral region. EEG showed continuous generalized and maximal left frontal region 3 to 6 Hz theta-delta slowing. Hyperventilation and photic stimulation  were not performed.   ABNORMALITY -Continuous slow, generalized and maximal left frontal region IMPRESSION: This study is suggestive of cortical dysfunction in left frontal region likely due to underlying stroke. There is also moderate diffuse encephalopathy, nonspecific etiology. No seizures or epileptiform discharges were seen throughout the recording. Lora Havens   ECHOCARDIOGRAM COMPLETE  Result Date: 02/15/2020    ECHOCARDIOGRAM REPORT   Patient Name:   [name] Date of Exam: 02/15/2020 Medical Rec #:  [id number]      Height:       62.0 in Accession #:    [id number]     Weight:       110.0 lb Date of Birth:  07-29-1932      BSA:          1.483 m Patient Age:    67 years       BP:           101/74 mmHg Patient Gender: F              HR:           82 bpm. Exam Location:  Inpatient Procedure: 2D Echo and Intracardiac Opacification Agent Indications:    TIA  History:        Patient has prior history of Echocardiogram examinations, most                 recent 01/31/2018. CHF, TIA, Arrythmias:Atrial Fibrillation; Risk                 Factors:Hypertension.  Sonographer:    Johny Chess Referring Phys: [id number] [place]  1. Left ventricular ejection fraction, by estimation, is 30 to 35%. The left ventricle has moderately decreased function. The left ventricle demonstrates regional wall motion abnormalities (see scoring  diagram/findings for description). There is mild left ventricular hypertrophy. Left ventricular diastolic function could not be evaluated. There is severe akinesis of the left ventricular, entire anterior wall, anteroseptal wall, apical segment and inferoapical segment. Findings suggest extensive LAD territory ischemia/infarct or less likely stress-induced cardimyopathy. The based of the heart is hyperdynamic. No mural thrombus with Definity contrast.  2. Right ventricular systolic function is moderately reduced. The right ventricular size is normal. There is mildly  elevated pulmonary artery systolic pressure. The estimated right ventricular systolic pressure is 67.6 mmHg.  3. Left atrial size was moderately dilated.  4. The mitral valve is abnormal. Mild mitral valve regurgitation.  5. The aortic valve is tricuspid. Aortic valve regurgitation is not visualized. Mild aortic valve sclerosis is present, with no evidence of aortic valve stenosis.  6. The inferior vena cava is normal in size with <50% respiratory variability, suggesting right atrial pressure of 8 mmHg. Comparison(s): Changes from prior study are noted. 01/31/2018: LVEF 60-65%. FINDINGS  Left Ventricle: Left ventricular ejection fraction, by estimation, is 30 to 35%. The left ventricle has moderately decreased function. The left ventricle demonstrates regional wall motion abnormalities. Severe akinesis of the left ventricular, entire anterior wall, anteroseptal wall, apical segment and inferoapical segment. Definity contrast agent was given IV to delineate the left ventricular endocardial borders. The left ventricular internal cavity size was normal in size. There is mild left ventricular hypertrophy. Left ventricular diastolic function could not be evaluated due to atrial fibrillation. Left ventricular diastolic function could not be evaluated. Right Ventricle: The right ventricular size is normal. No increase in right ventricular wall thickness. Right ventricular systolic function is moderately reduced. There is mildly elevated pulmonary artery systolic pressure. The tricuspid regurgitant velocity is 2.85 m/s, and with an assumed right atrial pressure of 8 mmHg, the estimated right ventricular systolic pressure is 72.0 mmHg. Left Atrium: Left atrial size was moderately dilated. Right Atrium: Right atrial size was normal in size. Pericardium: There is no evidence of pericardial effusion. Mitral Valve: The mitral valve is abnormal. There is moderate thickening of the mitral valve leaflet(s). Mild mitral valve  regurgitation. Tricuspid Valve: The tricuspid valve is grossly normal. Tricuspid valve regurgitation is mild. Aortic Valve: The aortic valve is tricuspid. Aortic valve regurgitation is not visualized. Mild aortic valve sclerosis is present, with no evidence of aortic valve stenosis. Pulmonic Valve: The pulmonic valve was grossly normal. Pulmonic valve regurgitation is trivial. Aorta: The aortic root and ascending aorta are structurally normal, with no evidence of dilitation. Venous: The inferior vena cava is normal in size with less than 50% respiratory variability, suggesting right atrial pressure of 8 mmHg. IAS/Shunts: No atrial level shunt detected by color flow Doppler.  LEFT VENTRICLE PLAX 2D LVIDd:         3.80 cm LVIDs:         3.10 cm LV PW:         1.00 cm LV IVS:        1.10 cm LVOT diam:     1.60 cm LV SV:         16 LV SV Index:   11 LVOT Area:     2.01 cm  RIGHT VENTRICLE            IVC RV S prime:     6.83 cm/s  IVC diam: 1.90 cm TAPSE (M-mode): 1.1 cm LEFT ATRIUM             Index  RIGHT ATRIUM           Index LA diam:        4.30 cm 2.90 cm/m  RA Area:     16.10 cm LA Vol (A2C):   42.9 ml 28.93 ml/m RA Volume:   43.10 ml  29.07 ml/m LA Vol (A4C):   60.2 ml 40.60 ml/m LA Biplane Vol: 54.1 ml 36.49 ml/m  AORTIC VALVE LVOT Vmax:   56.80 cm/s LVOT Vmean:  36.400 cm/s LVOT VTI:    0.082 m  AORTA Ao Root diam: 2.60 cm Ao Asc diam:  3.00 cm TRICUSPID VALVE TR Peak grad:   32.5 mmHg TR Vmax:        285.00 cm/s  SHUNTS Systemic VTI:  0.08 m Systemic Diam: 1.60 cm Lyman Bishop MD Electronically signed by Lyman Bishop MD Signature Date/Time: 02/15/2020/5:33:43 PM    Final    CT HEAD CODE STROKE WO CONTRAST  Result Date: 02/14/2020 CLINICAL DATA:  Code stroke.  87 year old female. EXAM: CT HEAD WITHOUT CONTRAST TECHNIQUE: Contiguous axial images were obtained from the base of the skull through the vertex without intravenous contrast. COMPARISON:  Brain MRI 03/03/2018.  Head CT 11/14/2019.  FINDINGS: Brain: Stable cerebral volume since last year. No midline shift, ventriculomegaly, mass effect, evidence of mass lesion, intracranial hemorrhage or evidence of cortically based acute infarction. Patchy and confluent bilateral cerebral white matter hypodensity is stable. No cortical encephalomalacia identified. Vascular: Calcified atherosclerosis at the skull base. No suspicious intracranial vascular hyperdensity. Skull: Stable.  No acute osseous abnormality identified. Sinuses/Orbits: Chronic left mastoid effusion. Other Visualized paranasal sinuses and mastoids are stable and well pneumatized. Other: Stable orbit and scalp soft tissues. ASPECTS [facility] Stroke Program Early CT Score) Total score (0-10 with 10 being normal): 10 IMPRESSION: 1. Stable non contrast CT appearance of the brain. Advanced white matter disease. ASPECTS 10. 2. These results were communicated to Dr. Cheral Marker at 11:24 am on 02/14/2020 by text page via the [facility] messaging system. Electronically Signed   By: Genevie [name] M.D.   On: 02/14/2020 11:25     Medical Consultants:    None.   Subjective:    [name] no new complaints this morning relates she feels better than yesterday.  Objective:    Vitals:   02/16/20 0445 02/16/20 0515 02/16/20 0545 02/16/20 0615  BP: 126/83 129/74 107/72 120/72  Pulse: 99 96 81 93  Resp: (!) 22 (!) 21 (!) 21 (!) 21  Temp:      TempSrc:      SpO2: 94% 95% 92% 94%  Weight:       SpO2: 94 % O2 Flow Rate (L/min): 2 L/min   Intake/Output Summary (Last 24 hours) at 02/16/2020 0830 Last data filed at 02/16/2020 0400 Gross per 24 hour  Intake 410.81 ml  Output --  Net 410.81 ml   Filed Weights   02/14/20 1600  Weight: 49.9 kg    Exam: General exam: In no acute distress. Respiratory system: Good air movement and clear to auscultation. Cardiovascular system: S1 & S2 heard, RRR. No JVD. Gastrointestinal system: Abdomen is nondistended, soft and nontender.  Extremities: No  pedal edema. Skin: No rashes, lesions or ulcers Psychiatry: Judgement and insight appear normal. Mood & affect appropriate.   Data Reviewed:    Labs: Basic Metabolic Panel: Recent Labs  Lab 02/14/20 1100 02/14/20 1117 02/15/20 0536 02/15/20 2144 02/16/20 0602  NA 132* 132* 133* 135 136  K 4.4 4.1 5.2* 4.3 4.2  CL  96* 95* 98 103 104  CO2 22  --  17* 20* 20*  GLUCOSE 143* 144* 136* 128* 132*  BUN 25* 28* 20 21 21   CREATININE 1.01* 0.80 0.69 0.78 0.83  CALCIUM 8.4*  --  8.7* 8.4* 8.5*   GFR Estimated Creatinine Clearance: 39 mL/min (by C-G formula based on SCr of 0.83 mg/dL). Liver Function Tests: Recent Labs  Lab 02/14/20 1100  AST 26  ALT 22  ALKPHOS 117  BILITOT 0.6  PROT 6.1*  ALBUMIN 3.1*   No results for input(s): LIPASE, AMYLASE in the last 168 hours. No results for input(s): AMMONIA in the last 168 hours. Coagulation profile Recent Labs  Lab 02/14/20 1100  INR 1.1   COVID-19 Labs  No results for input(s): DDIMER, FERRITIN, LDH, CRP in the last 72 hours.  Lab Results  Component Value Date   [place] NEGATIVE 02/14/2020    CBC: Recent Labs  Lab 02/14/20 1100 02/14/20 1117  WBC 11.3*  --   NEUTROABS 8.7*  --   HGB 14.4 15.0  HCT 45.2 44.0  MCV 97.8  --   PLT 414*  --    Cardiac Enzymes: No results for input(s): CKTOTAL, CKMB, CKMBINDEX, TROPONINI in the last 168 hours. BNP (last 3 results) No results for input(s): PROBNP in the last 8760 hours. CBG: Recent Labs  Lab 02/14/20 1110  GLUCAP 148*   D-Dimer: No results for input(s): DDIMER in the last 72 hours. Hgb A1c: Recent Labs    02/15/20 0536  HGBA1C 5.6   Lipid Profile: Recent Labs    02/15/20 0536  CHOL 185  HDL 58  LDLCALC 101*  TRIG 130  CHOLHDL 3.2   Thyroid function studies: Recent Labs    02/14/20 1824  TSH 5.030*   Anemia work up: No results for input(s): VITAMINB12, FOLATE, FERRITIN, TIBC, IRON, RETICCTPCT in the last 72 hours. Sepsis Labs: Recent  Labs  Lab 02/14/20 1100  WBC 11.3*   Microbiology Recent Results (from the past 240 hour(s))  Resp Panel by RT-PCR (Flu A&B, Covid) Nasopharyngeal Swab     Status: None   Collection Time: 02/14/20 12:14 PM   Specimen: Nasopharyngeal Swab; Nasopharyngeal(NP) swabs in vial transport medium  Result Value Ref Range Status   SARS Coronavirus 2 by RT PCR NEGATIVE NEGATIVE Final    Comment: (NOTE) SARS-CoV-2 target nucleic acids are NOT DETECTED.  The SARS-CoV-2 RNA is generally detectable in upper respiratory specimens during the acute phase of infection. The lowest concentration of SARS-CoV-2 viral copies this assay can detect is 138 copies/mL. A negative result does not preclude SARS-Cov-2 infection and should not be used as the sole basis for treatment or other patient management decisions. A negative result may occur with  improper specimen collection/handling, submission of specimen other than nasopharyngeal swab, presence of viral mutation(s) within the areas targeted by this assay, and inadequate number of viral copies(<138 copies/mL). A negative result must be combined with clinical observations, patient history, and epidemiological information. The expected result is Negative.  Fact Sheet for Patients:  EntrepreneurPulse.com.au  Fact Sheet for Healthcare Providers:  IncredibleEmployment.be  This test is no t yet approved or cleared by the [place] FDA and  has been authorized for detection and/or diagnosis of SARS-CoV-2 by FDA under an Emergency Use Authorization (EUA). This EUA will remain  in effect (meaning this test can be used) for the duration of the COVID-19 declaration under Section 564(b)(1) of the Act, 21 U.S.C.section 360bbb-3(b)(1), unless the authorization is terminated  or revoked sooner.       Influenza A by PCR NEGATIVE NEGATIVE Final   Influenza B by PCR NEGATIVE NEGATIVE Final    Comment: (NOTE) The Xpert  Xpress SARS-CoV-2/FLU/RSV plus assay is intended as an aid in the diagnosis of influenza from Nasopharyngeal swab specimens and should not be used as a sole basis for treatment. Nasal washings and aspirates are unacceptable for Xpert Xpress SARS-CoV-2/FLU/RSV testing.  Fact Sheet for Patients: EntrepreneurPulse.com.au  Fact Sheet for Healthcare Providers: IncredibleEmployment.be  This test is not yet approved or cleared by the [place] FDA and has been authorized for detection and/or diagnosis of SARS-CoV-2 by FDA under an Emergency Use Authorization (EUA). This EUA will remain in effect (meaning this test can be used) for the duration of the COVID-19 declaration under Section 564(b)(1) of the Act, 21 U.S.C. section 360bbb-3(b)(1), unless the authorization is terminated or revoked.  Performed at [facility], [place] [address] [place], [place] [address]      Medications:   . apixaban  2.5 mg Oral BID  . atorvastatin  20 mg Oral QODAY  . busPIRone  10 mg Oral BID  . calcium carbonate  1,250 mg Oral Q breakfast  . digoxin  0.0625 mg Oral Rogers  . diltiazem  60 mg Oral Q6H  . lacosamide  100 mg Oral BID  . [START ON 02/17/2020] levothyroxine  75 mcg Oral Q0600  . metoprolol tartrate  12.5 mg Oral BID  . sertraline  50 mg Oral Rogers  . sodium chloride flush  10-40 mL Intracatheter Q12H  . sodium polystyrene  15 g Oral Once  . tamsulosin  0.4 mg Oral Rogers  . traZODone  25 mg Oral QHS   Continuous Infusions:     LOS: 2 days   Charlynne Cousins  [facility]  02/16/2020, 8:30 AM

## 2020-02-16 NOTE — Evaluation (Signed)
Occupational Therapy Evaluation Patient Details Name: [name] MRN: [id number] DOB: May 16, 1932 Today's Date: 02/16/2020    History of Present Illness 87 y.o. female past medical history significant for advanced dementia, chronic A. fib on systemic anticoagulation, hypothyroidism is brought in by EMS for strokelike symptoms.  In the ED she was found in A. fib. MRI found multiple small acute infarcts involving bilateral frontoparietal lobes, left occipital lobe, and the left caudate.   Clinical Impression   PTA patient had been residing at [facility] since 11/2019. Per son present at bedside, patient was ambulating with use of RW up until ~2 weeks ago with onset of back pain. Since that time patient has not been walking as much. Patient's son also reports assist at baseline for ADLs including bathing/dressing with use of brief for incontinence at baseline. Per son, patient was able to feed herself. Patient currently functioning below baseline requiring Max A to Total A grossly for self-care tasks at bed level. Patient also limited by deficits not limited to R-sided hemiparesis, R visual field inattention, decreased sensation, decreased coordination, decreased sitting balance, and decreased cognition on top of advanced dementia at baseline. Patient would benefit from continued acute OT services to maximize safety and independence with self-care tasks and decrease caregiver burden. Recommendation for return to SNF with therapy services.     Follow Up Recommendations  SNF;Supervision/Assistance - 24 hour    Equipment Recommendations  Other (comment) (Defer to next level of care)    Recommendations for Other Services       Precautions / Restrictions Precautions Precautions: Fall Precaution Comments: R hemi, R visual field deficits, R inattention, incontinent x2, 1.5L O2 Restrictions Weight Bearing Restrictions: No      Mobility Bed Mobility Overal bed mobility: Needs Assistance Bed  Mobility: Supine to Sit;Sit to Supine     Supine to sit: Total assist Sit to supine: Total assist   General bed mobility comments: Total A for all parts of bed mobility. Patient does not attempt to assist.    Transfers Overall transfer level: Needs assistance Equipment used: None Transfers: Sit to/from Stand Sit to Stand: Total assist         General transfer comment: Face to face transfer with  bilateral knee block and Total A with use of chuck pad. Patient with flexed trunk and head maintained forward and to L despite cues. Patient unable to attain full upright position.    Balance Overall balance assessment: Needs assistance Sitting-balance support: Single extremity supported;Bilateral upper extremity supported;Feet supported Sitting balance-Leahy Scale: Zero Sitting balance - Comments: Total A with periods of Max A. Postural control: Left lateral lean;Other (comment) (Anterior) Standing balance support: Bilateral upper extremity supported;During functional activity Standing balance-Leahy Scale: Zero Standing balance comment: Total A to come to standing from EOB.                           ADL either performed or assessed with clinical judgement   ADL Overall ADL's : Needs assistance/impaired     Grooming: Maximal assistance;Bed level           Upper Body Dressing : Maximal assistance Upper Body Dressing Details (indicate cue type and reason): Donned anterior hospital gown. Cues for hemi technique. Lower Body Dressing: Total assistance;Bed level Lower Body Dressing Details (indicate cue type and reason): Total A to don footwear in supine.               General ADL Comments:  Patient requires Max A to Total A grossly for self-care tasks. Total A for sit to stand from EOB.     Vision Baseline Vision/History: Wears glasses Wears Glasses: At all times Patient Visual Report: Other (comment) (Unable to state.) Vision Assessment?: Vision impaired- to  be further tested in functional context Additional Comments: Difficult to assess. Patient unable to follow cues necessary for formal testing. Demonstrates R visual field inattention during functional tasks.     Perception Perception Perception Tested?: Yes Perception Deficits: Inattention/neglect Inattention/Neglect: Does not attend to right visual field;Does not attend to right side of body   Praxis      Pertinent Vitals/Pain Pain Assessment: Faces Faces Pain Scale: Hurts even more Pain Location: Back with movement Pain Descriptors / Indicators: Grimacing;Moaning Pain Intervention(s): Monitored during session;Repositioned;Limited activity within patient's tolerance     Hand Dominance Right   Extremity/Trunk Assessment Upper Extremity Assessment Upper Extremity Assessment: RUE deficits/detail RUE Deficits / Details: MMT 1/5 at shoulder, elbow and wrist. 2-/5 at digits. RUE Sensation: decreased light touch;decreased proprioception RUE Coordination: decreased fine motor;decreased gross motor LUE Deficits / Details: Patient able to wash face with LUE. Noted increased weakness R>L.   Lower Extremity Assessment Lower Extremity Assessment: Defer to PT evaluation   Cervical / Trunk Assessment Cervical / Trunk Assessment: Kyphotic Cervical / Trunk Exceptions: C-spine cleared. Patient maintains head to L and forward.   Communication Communication Communication: HOH;Receptive difficulties;Expressive difficulties   Cognition Arousal/Alertness: Awake/alert Behavior During Therapy: Flat affect Overall Cognitive Status: Impaired/Different from baseline Area of Impairment: Orientation;Attention;Memory;Following commands;Safety/judgement;Awareness;Problem solving                 Orientation Level: Place;Time;Situation Current Attention Level: Focused Memory: Decreased short-term memory (Hx of dementia at baseline) Following Commands: Follows one step commands  inconsistently Safety/Judgement: Decreased awareness of safety;Decreased awareness of deficits Awareness: Intellectual Problem Solving: Slow processing;Decreased initiation;Difficulty sequencing General Comments: Patient oriented to person only. At times patient reports being in the hospital but at other times states that she is at home. Son eneterd at conclusion of evaluation and patient able to recall sons name. May be near baseline mentation.   General Comments  Son present at conclusion of OT assessment. Able to confirm PLOF.    Exercises     Shoulder Instructions      Home Living Family/patient expects to be discharged to:: Skilled nursing facility                             Home Equipment: [place] Rile - 2 wheels   Additional Comments: Since 11/2019. Prior to 11/21, patient was living with her sister.      Prior Functioning/Environment Level of Independence: Needs assistance  Gait / Transfers Assistance Needed: Son present at bedside reprots patient was ambulating with use of RW up until 2 weeks ago. Back pain limited functional mobility. ADL's / Homemaking Assistance Needed: Assist for dressing/bathing. Uses incontinence brief.            OT Problem List: Decreased strength;Decreased range of motion;Decreased activity tolerance;Impaired balance (sitting and/or standing);Impaired vision/perception;Decreased coordination;Decreased cognition;Decreased safety awareness;Decreased knowledge of use of DME or AE;Impaired sensation;Impaired tone;Impaired UE functional use;Pain      OT Treatment/Interventions: Self-care/ADL training;Therapeutic exercise;Neuromuscular education;Energy conservation;DME and/or AE instruction;Therapeutic activities;Cognitive remediation/compensation;Visual/perceptual remediation/compensation;Patient/family education;Balance training    OT Goals(Current goals can be found in the care plan section) Acute Rehab OT Goals Patient Stated Goal: Per son  to return to SNF with OT  services. OT Goal Formulation: With patient/family Time For Goal Achievement: 03/01/20 Potential to Achieve Goals: Fair ADL Goals Pt Will Perform Grooming: sitting;with mod assist Pt Will Perform Upper Body Dressing: with mod assist;sitting Pt Will Perform Lower Body Dressing: with max assist;bed level Additional ADL Goal #1: Patient will maintain static sitting balance at EOB with Min A for >5 min in prep for ADLs in sitting.  OT Frequency: Min 2X/week   Barriers to D/C:            Co-evaluation              AM-PAC OT "6 Clicks" Daily Activity     Outcome Measure Help from another person eating meals?: A Lot Help from another person taking care of personal grooming?: A Lot Help from another person toileting, which includes using toliet, bedpan, or urinal?: Total Help from another person bathing (including washing, rinsing, drying)?: Total Help from another person to put on and taking off regular upper body clothing?: A Lot Help from another person to put on and taking off regular lower body clothing?: Total 6 Click Score: 9   End of Session Equipment Utilized During Treatment: Gait belt Nurse Communication: Mobility status  Activity Tolerance: Patient tolerated treatment well Patient left: in bed;with call bell/phone within reach;with bed alarm set;with family/visitor present  OT Visit Diagnosis: Unsteadiness on feet (R26.81);Other abnormalities of gait and mobility (R26.89);Muscle weakness (generalized) (M62.81);History of falling (Z91.81);Feeding difficulties (R63.3)                Time: 7741-2878 OT Time Calculation (min): 32 min Charges:  OT General Charges $OT Visit: 1 Visit OT Evaluation $OT Eval Moderate Complexity: 1 Mod OT Treatments $Therapeutic Activity: 8-22 mins  Abbey Veith H. OTR/L Supplemental OT, Department of rehab services [phone]  Attikus Bartoszek R H. 02/16/2020, 2:07 PM

## 2020-02-17 DIAGNOSIS — I4891 Unspecified atrial fibrillation: Secondary | ICD-10-CM | POA: Diagnosis not present

## 2020-02-17 DIAGNOSIS — I10 Essential (primary) hypertension: Secondary | ICD-10-CM | POA: Diagnosis not present

## 2020-02-17 LAB — BASIC METABOLIC PANEL
Anion gap: 10 (ref 5–15)
BUN: 29 mg/dL — ABNORMAL HIGH (ref 8–23)
CO2: 23 mmol/L (ref 22–32)
Calcium: 8.2 mg/dL — ABNORMAL LOW (ref 8.9–10.3)
Chloride: 104 mmol/L (ref 98–111)
Creatinine, Ser: 0.78 mg/dL (ref 0.44–1.00)
GFR, Estimated: >60 mL/min (ref >60)
Glucose, Bld: 119 mg/dL — ABNORMAL HIGH (ref 70–99)
Potassium: 4.3 mmol/L (ref 3.5–5.1)
Sodium: 137 mmol/L (ref 135–145)

## 2020-02-17 MED ORDER — METOPROLOL TARTRATE 50 MG PO TABS
50.0000 mg | ORAL_TABLET | Freq: Two times a day (BID) | ORAL | Status: DC
  Administered 2020-02-17 (×2): 50 mg via ORAL
  Filled 2020-02-17 (×2): qty 1

## 2020-02-17 NOTE — Progress Notes (Signed)
[facility] PROGRESS NOTE    Progress Note  [name]  ONG:[id number] DOB: 16-Feb-1932 DOA: 02/14/2020 PCP: Dettinger, Fransisca Kaufmann, MD     Brief Narrative:   [name] is an 87 y.o. female past medical history significant for advanced dementia, chronic A. fib on systemic anticoagulation, hypothyroidism is brought in by EMS for strokelike symptoms.  In the ED she was found in A. fib started on Cardizem drip.  Significant Events: 02/14/2020 admission  Significant studies: 02/14/2020 CT of the head showed no acute findings. 02/14/2020 CTA was negative for large caliber occlusion 02/14/2020 MRI of the brain showed multiple small acute infarcts involving the bilateral frontal parietal left occipital lobe with edema without mass-effect, advanced chronic microvascular ischemic changes bilateral cerebral microhemorrhage 2D echo an EF of 30% with severe akinesia of the left ventricular entire anterior wall, anteroseptal wall and apical segment.  Reduced right ventricular systolic function moderately reduced with a PA pressure of 40 mmHg.  Bilateral atrial enlargement.  Antibiotics: None  Microbiology data: Blood culture:  Procedures: None  Assessment/Plan:   CVA: Speech evaluated the patient recommended a dysphagia 2 diet. Her on doxepin was discontinued she was switched to Eliquis. Physical therapy evaluated the patient and recommended skilled nursing facility. Patient is medically stable to be transferred to skilled  A. fib with RVR: Cardiology recommended to taper down diltiazem and increase metoprolol. Her heart rate still greater than 110 consistently. We will increase her metoprolol, continue digoxin.  We can probably stop diltiazem later on this afternoon.  New wall motion abnormality: Cardiology evaluated the patient recommended no further work-up after discussing with the son.  Chronic diastolic CHF (congestive heart failure) ([place]) Noted she appears euvolemic  hold diuretic therapy.  And antihypertensive medications to allow permissive hypertension.  Essential hypertension: Well-controlled, tapering down diltiazem and increasing metoprolol. Blood pressure seems to be relatively well controlled.  Seizure disorder No seizure event on Vimpat will continue current medication. Continue Ativan as needed for possible seizures.    DVT prophylaxis: DOAC Family Communication:daughter Status is: Inpatient  Remains inpatient appropriate because:Hemodynamically unstable   Dispo: The patient is from: SNF              Anticipated d/c is to: SNF              Anticipated d/c date is: 1 days              Patient currently is medically stable to d/c.   Difficult to place patient No Code Status:     Code Status Orders  (From admission, onward)         Start     Ordered   02/14/20 1332  Limited resuscitation (code)  Continuous       Question Answer Comment  In the event of cardiac or respiratory ARREST: Initiate Code Blue, Call Rapid Response Yes   In the event of cardiac or respiratory ARREST: Perform CPR No   In the event of cardiac or respiratory ARREST: Perform Intubation/Mechanical Ventilation No   In the event of cardiac or respiratory ARREST: Use NIPPV/BiPAp only if indicated Yes   In the event of cardiac or respiratory ARREST: Administer ACLS medications if indicated Yes   In the event of cardiac or respiratory ARREST: Perform Defibrillation or Cardioversion if indicated Yes      02/14/20 1333        Code Status History    Date Active Date Inactive Code Status Order ID Comments User  Context   03/01/2018 1949 03/08/2018 0028 Partial Code [id number]  Bethena Roys, MD Inpatient   01/30/2018 1849 02/03/2018 1726 Full Code [id number]  Cristy Folks, MD Inpatient   03/15/2017 1612 03/17/2017 1713 Full Code [id number]  Phillips Grout, MD ED   01/28/2017 0304 01/28/2017 1933 Full Code [id number]  Vianne Bulls, MD ED   11/13/2016 0549  11/13/2016 1704 Full Code [id number]  Jani Gravel, MD Inpatient   06/22/2016 1337 06/22/2016 2104 Full Code [id number]  Troy Sine, MD Inpatient   04/23/2016 1543 04/26/2016 1934 DNR [id number]  Eber Jones, MD Inpatient   04/23/2016 1543 04/23/2016 1543 DNR [id number]  Eber Jones, MD Inpatient   09/29/2015 0215 09/29/2015 2119 Full Code [id number]  Oswald Hillock, MD Inpatient   04/21/2015 1506 04/22/2015 1811 Full Code [id number]  Orvan Falconer, MD ED   01/12/2015 0052 01/16/2015 1512 Full Code [id number]  Edwin Dada, MD Inpatient   11/17/2010 1810 11/20/2010 1310 Full Code [id number]  Marcy Panning, RN Inpatient   Advance Care Planning Activity        IV Access:    Peripheral IV   Procedures and diagnostic studies:   MR BRAIN WO CONTRAST  Result Date: 02/15/2020 EXAM: MRI HEAD WITHOUT CONTRAST TECHNIQUE: Multiplanar, multiecho pulse sequences of the brain and surrounding structures were obtained without intravenous contrast. COMPARISON:  MRI March 03, 2018. FINDINGS: Brain: Multiple cortical and subcortical acute infarcts in the high left frontoparietal region. Additional punctate infarcts in the high right parietal lobe and left occipital lobe, small infarct in left caudate head, and small cortical infarct in the high right frontal lobe. Mild edema without mass effect. No midline shift. Basal cisterns are patent. No evidence of acute hemorrhage. Additional advanced scattered T2/FLAIR hyperintensities within the white matter, most likely related to chronic microvascular ischemic disease. Generalized cerebral volume loss with ex vacuo ventricular dilation. Multiple small foci of susceptibility artifact involving bilateral cerebellar hemispheres and right temporal lobe, likely the sequela of prior microhemorrhage. No extra-axial fluid collection. Vascular: Major arterial flow voids are maintained at the skull base. Better evaluated on recent CTA, Skull and upper cervical  spine: Normal marrow signal. Sinuses/Orbits: Sinuses are largely clear.  Unremarkable orbits. Other: Small left and trace right mastoid effusions. IMPRESSION: 1. Multiple small acute infarcts involving bilateral frontoparietal lobes, left occipital lobe, and the left caudate. The high left frontoparietal region is most involved. Mild edema without mass effect. Involvement of multiple vascular territories is concerning for a central embolic etiology. 2. Advanced chronic microvascular ischemic disease. 3. Bilateral cerebellar and right temporal lobe prior microhemorrhages, possibly the sequela of hypertension, amyloid angiopathy, or emboli. These results will be called to the ordering clinician or representative by the Radiologist Assistant, and communication documented in the PACS or [employer]. Electronically Signed   By: Margaretha Sheffield MD   On: 02/15/2020 11:02   EEG adult  Result Date: 02/15/2020 Lora Havens, MD     02/15/2020  3:30 PM Patient Name: [name] MRN: [id number] Epilepsy Attending: Lora Havens Referring Physician/Provider: Dr Rosalin Hawking Date: 02/15/2020 Duration: 25.37 mins Patient history: 87yo F with h/o seizure presented after fall and ams. EEG to evaluate for seizure Level of alertness: Awake, asleep AEDs during EEG study: PHT Technical aspects: This EEG study was done with scalp electrodes positioned according to the 10-20 International system of electrode placement. Electrical activity was acquired at a sampling rate of 500Hz  and reviewed with a high frequency  filter of 70Hz  and a low frequency filter of 1Hz . EEG data were recorded continuously and digitally stored. Description: No clear posterior dominant rhythm was seen. Sleep was characterized by sleep spindles (12 to 14 Hz), maximal frontocentral region. EEG showed continuous generalized and maximal left frontal region 3 to 6 Hz theta-delta slowing. Hyperventilation and photic stimulation were not performed.    ABNORMALITY -Continuous slow, generalized and maximal left frontal region IMPRESSION: This study is suggestive of cortical dysfunction in left frontal region likely due to underlying stroke. There is also moderate diffuse encephalopathy, nonspecific etiology. No seizures or epileptiform discharges were seen throughout the recording. Lora Havens   ECHOCARDIOGRAM COMPLETE  Result Date: 02/15/2020    ECHOCARDIOGRAM REPORT   Patient Name:   [name] Date of Exam: 02/15/2020 Medical Rec #:  [id number]      Height:       62.0 in Accession #:    [id number]     Weight:       110.0 lb Date of Birth:  Mar 29, 1932      BSA:          1.483 m Patient Age:    5 years       BP:           101/74 mmHg Patient Gender: F              HR:           82 bpm. Exam Location:  Inpatient Procedure: 2D Echo and Intracardiac Opacification Agent Indications:    TIA  History:        Patient has prior history of Echocardiogram examinations, most                 recent 01/31/2018. CHF, TIA, Arrythmias:Atrial Fibrillation; Risk                 Factors:Hypertension.  Sonographer:    Johny Chess Referring Phys: [id number] [place]  1. Left ventricular ejection fraction, by estimation, is 30 to 35%. The left ventricle has moderately decreased function. The left ventricle demonstrates regional wall motion abnormalities (see scoring diagram/findings for description). There is mild left ventricular hypertrophy. Left ventricular diastolic function could not be evaluated. There is severe akinesis of the left ventricular, entire anterior wall, anteroseptal wall, apical segment and inferoapical segment. Findings suggest extensive LAD territory ischemia/infarct or less likely stress-induced cardimyopathy. The based of the heart is hyperdynamic. No mural thrombus with Definity contrast.  2. Right ventricular systolic function is moderately reduced. The right ventricular size is normal. There is mildly elevated pulmonary artery  systolic pressure. The estimated right ventricular systolic pressure is 82.4 mmHg.  3. Left atrial size was moderately dilated.  4. The mitral valve is abnormal. Mild mitral valve regurgitation.  5. The aortic valve is tricuspid. Aortic valve regurgitation is not visualized. Mild aortic valve sclerosis is present, with no evidence of aortic valve stenosis.  6. The inferior vena cava is normal in size with <50% respiratory variability, suggesting right atrial pressure of 8 mmHg. Comparison(s): Changes from prior study are noted. 01/31/2018: LVEF 60-65%. FINDINGS  Left Ventricle: Left ventricular ejection fraction, by estimation, is 30 to 35%. The left ventricle has moderately decreased function. The left ventricle demonstrates regional wall motion abnormalities. Severe akinesis of the left ventricular, entire anterior wall, anteroseptal wall, apical segment and inferoapical segment. Definity contrast agent was given IV to delineate the left ventricular endocardial borders. The left ventricular internal cavity size  was normal in size. There is mild left ventricular hypertrophy. Left ventricular diastolic function could not be evaluated due to atrial fibrillation. Left ventricular diastolic function could not be evaluated. Right Ventricle: The right ventricular size is normal. No increase in right ventricular wall thickness. Right ventricular systolic function is moderately reduced. There is mildly elevated pulmonary artery systolic pressure. The tricuspid regurgitant velocity is 2.85 m/s, and with an assumed right atrial pressure of 8 mmHg, the estimated right ventricular systolic pressure is 63.7 mmHg. Left Atrium: Left atrial size was moderately dilated. Right Atrium: Right atrial size was normal in size. Pericardium: There is no evidence of pericardial effusion. Mitral Valve: The mitral valve is abnormal. There is moderate thickening of the mitral valve leaflet(s). Mild mitral valve regurgitation. Tricuspid Valve:  The tricuspid valve is grossly normal. Tricuspid valve regurgitation is mild. Aortic Valve: The aortic valve is tricuspid. Aortic valve regurgitation is not visualized. Mild aortic valve sclerosis is present, with no evidence of aortic valve stenosis. Pulmonic Valve: The pulmonic valve was grossly normal. Pulmonic valve regurgitation is trivial. Aorta: The aortic root and ascending aorta are structurally normal, with no evidence of dilitation. Venous: The inferior vena cava is normal in size with less than 50% respiratory variability, suggesting right atrial pressure of 8 mmHg. IAS/Shunts: No atrial level shunt detected by color flow Doppler.  LEFT VENTRICLE PLAX 2D LVIDd:         3.80 cm LVIDs:         3.10 cm LV PW:         1.00 cm LV IVS:        1.10 cm LVOT diam:     1.60 cm LV SV:         16 LV SV Index:   11 LVOT Area:     2.01 cm  RIGHT VENTRICLE            IVC RV S prime:     6.83 cm/s  IVC diam: 1.90 cm TAPSE (M-mode): 1.1 cm LEFT ATRIUM             Index       RIGHT ATRIUM           Index LA diam:        4.30 cm 2.90 cm/m  RA Area:     16.10 cm LA Vol (A2C):   42.9 ml 28.93 ml/m RA Volume:   43.10 ml  29.07 ml/m LA Vol (A4C):   60.2 ml 40.60 ml/m LA Biplane Vol: 54.1 ml 36.49 ml/m  AORTIC VALVE LVOT Vmax:   56.80 cm/s LVOT Vmean:  36.400 cm/s LVOT VTI:    0.082 m  AORTA Ao Root diam: 2.60 cm Ao Asc diam:  3.00 cm TRICUSPID VALVE TR Peak grad:   32.5 mmHg TR Vmax:        285.00 cm/s  SHUNTS Systemic VTI:  0.08 m Systemic Diam: 1.60 cm Lyman Bishop MD Electronically signed by Lyman Bishop MD Signature Date/Time: 02/15/2020/5:33:43 PM    Final      Medical Consultants:    None.   Subjective:    [name] no new complaints.  Objective:    Vitals:   02/16/20 1805 02/16/20 1938 02/16/20 2335 02/17/20 0324  BP:  113/76 124/74 122/82  Pulse:  94 97 99  Resp: 18 18 18  (!) 23  Temp:  98.2 F (36.8 C) 98 F (36.7 C) 99.2 F (37.3 C)  TempSrc:  Oral Oral Oral  SpO2:  94% 98%  99%  Weight:       SpO2: 99 % O2 Flow Rate (L/min): 2 L/min   Intake/Output Summary (Last 24 hours) at 02/17/2020 0811 Last data filed at 02/16/2020 2146 Gross per 24 hour  Intake 195 ml  Output --  Net 195 ml   Filed Weights   02/14/20 1600  Weight: 49.9 kg    Exam: General exam: In no acute distress. Respiratory system: Good air movement and clear to auscultation. Cardiovascular system: S1 & S2 heard, RRR. No JVD. Gastrointestinal system: Abdomen is nondistended, soft and nontender.  Extremities: No pedal edema. Skin: No rashes, lesions or ulcers   Data Reviewed:    Labs: Basic Metabolic Panel: Recent Labs  Lab 02/14/20 1100 02/14/20 1117 02/15/20 0536 02/15/20 2144 02/16/20 0602 02/17/20 0355  NA 132* 132* 133* 135 136 137  K 4.4 4.1 5.2* 4.3 4.2 4.3  CL 96* 95* 98 103 104 104  CO2 22  --  17* 20* 20* 23  GLUCOSE 143* 144* 136* 128* 132* 119*  BUN 25* 28* 20 21 21  29*  CREATININE 1.01* 0.80 0.69 0.78 0.83 0.78  CALCIUM 8.4*  --  8.7* 8.4* 8.5* 8.2*   GFR Estimated Creatinine Clearance: 40.5 mL/min (by C-G formula based on SCr of 0.78 mg/dL). Liver Function Tests: Recent Labs  Lab 02/14/20 1100  AST 26  ALT 22  ALKPHOS 117  BILITOT 0.6  PROT 6.1*  ALBUMIN 3.1*   No results for input(s): LIPASE, AMYLASE in the last 168 hours. No results for input(s): AMMONIA in the last 168 hours. Coagulation profile Recent Labs  Lab 02/14/20 1100  INR 1.1   COVID-19 Labs  No results for input(s): DDIMER, FERRITIN, LDH, CRP in the last 72 hours.  Lab Results  Component Value Date   [place] NEGATIVE 02/14/2020    CBC: Recent Labs  Lab 02/14/20 1100 02/14/20 1117  WBC 11.3*  --   NEUTROABS 8.7*  --   HGB 14.4 15.0  HCT 45.2 44.0  MCV 97.8  --   PLT 414*  --    Cardiac Enzymes: No results for input(s): CKTOTAL, CKMB, CKMBINDEX, TROPONINI in the last 168 hours. BNP (last 3 results) No results for input(s): PROBNP in the last 8760  hours. CBG: Recent Labs  Lab 02/14/20 1110  GLUCAP 148*   D-Dimer: No results for input(s): DDIMER in the last 72 hours. Hgb A1c: Recent Labs    02/15/20 0536  HGBA1C 5.6   Lipid Profile: Recent Labs    02/15/20 0536  CHOL 185  HDL 58  LDLCALC 101*  TRIG 130  CHOLHDL 3.2   Thyroid function studies: Recent Labs    02/14/20 1824  TSH 5.030*   Anemia work up: No results for input(s): VITAMINB12, FOLATE, FERRITIN, TIBC, IRON, RETICCTPCT in the last 72 hours. Sepsis Labs: Recent Labs  Lab 02/14/20 1100  WBC 11.3*   Microbiology Recent Results (from the past 240 hour(s))  Resp Panel by RT-PCR (Flu A&B, Covid) Nasopharyngeal Swab     Status: None   Collection Time: 02/14/20 12:14 PM   Specimen: Nasopharyngeal Swab; Nasopharyngeal(NP) swabs in vial transport medium  Result Value Ref Range Status   SARS Coronavirus 2 by RT PCR NEGATIVE NEGATIVE Final    Comment: (NOTE) SARS-CoV-2 target nucleic acids are NOT DETECTED.  The SARS-CoV-2 RNA is generally detectable in upper respiratory specimens during the acute phase of infection. The lowest concentration of SARS-CoV-2 viral copies this assay can detect is 138  copies/mL. A negative result does not preclude SARS-Cov-2 infection and should not be used as the sole basis for treatment or other patient management decisions. A negative result may occur with  improper specimen collection/handling, submission of specimen other than nasopharyngeal swab, presence of viral mutation(s) within the areas targeted by this assay, and inadequate number of viral copies(<138 copies/mL). A negative result must be combined with clinical observations, patient history, and epidemiological information. The expected result is Negative.  Fact Sheet for Patients:  EntrepreneurPulse.com.au  Fact Sheet for Healthcare Providers:  IncredibleEmployment.be  This test is no t yet approved or cleared by the  [place] FDA and  has been authorized for detection and/or diagnosis of SARS-CoV-2 by FDA under an Emergency Use Authorization (EUA). This EUA will remain  in effect (meaning this test can be used) for the duration of the COVID-19 declaration under Section 564(b)(1) of the Act, 21 U.S.C.section 360bbb-3(b)(1), unless the authorization is terminated  or revoked sooner.       Influenza A by PCR NEGATIVE NEGATIVE Final   Influenza B by PCR NEGATIVE NEGATIVE Final    Comment: (NOTE) The Xpert Xpress SARS-CoV-2/FLU/RSV plus assay is intended as an aid in the diagnosis of influenza from Nasopharyngeal swab specimens and should not be used as a sole basis for treatment. Nasal washings and aspirates are unacceptable for Xpert Xpress SARS-CoV-2/FLU/RSV testing.  Fact Sheet for Patients: EntrepreneurPulse.com.au  Fact Sheet for Healthcare Providers: IncredibleEmployment.be  This test is not yet approved or cleared by the [place] FDA and has been authorized for detection and/or diagnosis of SARS-CoV-2 by FDA under an Emergency Use Authorization (EUA). This EUA will remain in effect (meaning this test can be used) for the duration of the COVID-19 declaration under Section 564(b)(1) of the Act, 21 U.S.C. section 360bbb-3(b)(1), unless the authorization is terminated or revoked.  Performed at [facility], [place] [address] [place], [place] [address]      Medications:   . apixaban  2.5 mg Oral BID  . atorvastatin  40 mg Oral QODAY  . busPIRone  10 mg Oral BID  . calcium carbonate  1,250 mg Oral Q breakfast  . digoxin  0.0625 mg Oral Daily  . diltiazem  30 mg Oral Q6H  . lacosamide  100 mg Oral BID  . levothyroxine  75 mcg Oral Q0600  . metoprolol tartrate  25 mg Oral BID  . sertraline  50 mg Oral Daily  . sodium chloride flush  10-40 mL Intracatheter Q12H  . tamsulosin  0.4 mg Oral Daily  . traZODone  25 mg Oral QHS    Continuous Infusions:     LOS: 3 days   Charlynne Cousins  [facility]  02/17/2020, 8:11 AM

## 2020-02-17 NOTE — Progress Notes (Signed)
Progress Note  Patient Name: [name] Date of Encounter: 02/17/2020  Primary Cardiologist:   Minus Breeding, MD   Subjective   Very demented but pleasant.  Able to answer questions.   No pain or SOB.   Inpatient Medications    Scheduled Meds: . apixaban  2.5 mg Oral BID  . atorvastatin  40 mg Oral QODAY  . busPIRone  10 mg Oral BID  . calcium carbonate  1,250 mg Oral Q breakfast  . digoxin  0.0625 mg Oral Daily  . diltiazem  30 mg Oral Q6H  . lacosamide  100 mg Oral BID  . levothyroxine  75 mcg Oral Q0600  . metoprolol tartrate  50 mg Oral BID  . sertraline  50 mg Oral Daily  . sodium chloride flush  10-40 mL Intracatheter Q12H  . tamsulosin  0.4 mg Oral Daily  . traZODone  25 mg Oral QHS   Continuous Infusions:  PRN Meds: acetaminophen **OR** acetaminophen (TYLENOL) oral liquid 160 mg/5 mL **OR** acetaminophen, LORazepam, metoprolol tartrate, senna-docusate, sodium chloride flush   Vital Signs    Vitals:   02/16/20 2335 02/17/20 0324 02/17/20 0835 02/17/20 0859  BP: 124/74 122/82 122/76 127/88  Pulse: 97 99 (!) 105 99  Resp: 18 (!) 23 (!) 21 (!) 24  Temp: 98 F (36.7 C) 99.2 F (37.3 C) 98.3 F (36.8 C)   TempSrc: Oral Oral Oral   SpO2: 98% 99% 94% 95%  Weight:        Intake/Output Summary (Last 24 hours) at 02/17/2020 0954 Last data filed at 02/16/2020 2146 Gross per 24 hour  Intake 170 ml  Output -  Net 170 ml   Filed Weights   02/14/20 1600  Weight: 49.9 kg     ECG    NA - Personally Reviewed  Physical Exam   GEN: No acute distress.  Very frail Neck: No  JVD Cardiac: Irregular RR, no murmurs, rubs, or gallops.  Respiratory:     Decreased breath sounds, no crackles.  GI: Soft, nontender, non-distended  MS: No  edema; No deformity. Neuro:  Nonfocal  Psych: Normal affect   Labs    Chemistry Recent Labs  Lab 02/14/20 1100 02/14/20 1117 02/15/20 2144 02/16/20 0602 02/17/20 0355  NA 132*   < > 135 136 137  K 4.4   < > 4.3  4.2 4.3  CL 96*   < > 103 104 104  CO2 22   < > 20* 20* 23  GLUCOSE 143*   < > 128* 132* 119*  BUN 25*   < > 21 21 29*  CREATININE 1.01*   < > 0.78 0.83 0.78  CALCIUM 8.4*   < > 8.4* 8.5* 8.2*  PROT 6.1*  --   --   --   --   ALBUMIN 3.1*  --   --   --   --   AST 26  --   --   --   --   ALT 22  --   --   --   --   ALKPHOS 117  --   --   --   --   BILITOT 0.6  --   --   --   --   GFRNONAA 55*   < > >60 >60 >60  ANIONGAP 14   < > 12 12 10    < > = values in this interval not displayed.     Hematology Recent Labs  Lab 02/14/20 1100  02/14/20 1117  WBC 11.3*  --   RBC 4.62  --   HGB 14.4 15.0  HCT 45.2 44.0  MCV 97.8  --   MCH 31.2  --   MCHC 31.9  --   RDW 12.3  --   PLT 414*  --     Cardiac EnzymesNo results for input(s): TROPONINI in the last 168 hours. No results for input(s): TROPIPOC in the last 168 hours.   BNPNo results for input(s): BNP, PROBNP in the last 168 hours.   DDimer No results for input(s): DDIMER in the last 168 hours.   Radiology    MR BRAIN WO CONTRAST  Result Date: 02/15/2020 EXAM: MRI HEAD WITHOUT CONTRAST TECHNIQUE: Multiplanar, multiecho pulse sequences of the brain and surrounding structures were obtained without intravenous contrast. COMPARISON:  MRI March 03, 2018. FINDINGS: Brain: Multiple cortical and subcortical acute infarcts in the high left frontoparietal region. Additional punctate infarcts in the high right parietal lobe and left occipital lobe, small infarct in left caudate head, and small cortical infarct in the high right frontal lobe. Mild edema without mass effect. No midline shift. Basal cisterns are patent. No evidence of acute hemorrhage. Additional advanced scattered T2/FLAIR hyperintensities within the white matter, most likely related to chronic microvascular ischemic disease. Generalized cerebral volume loss with ex vacuo ventricular dilation. Multiple small foci of susceptibility artifact involving bilateral cerebellar  hemispheres and right temporal lobe, likely the sequela of prior microhemorrhage. No extra-axial fluid collection. Vascular: Major arterial flow voids are maintained at the skull base. Better evaluated on recent CTA, Skull and upper cervical spine: Normal marrow signal. Sinuses/Orbits: Sinuses are largely clear.  Unremarkable orbits. Other: Small left and trace right mastoid effusions. IMPRESSION: 1. Multiple small acute infarcts involving bilateral frontoparietal lobes, left occipital lobe, and the left caudate. The high left frontoparietal region is most involved. Mild edema without mass effect. Involvement of multiple vascular territories is concerning for a central embolic etiology. 2. Advanced chronic microvascular ischemic disease. 3. Bilateral cerebellar and right temporal lobe prior microhemorrhages, possibly the sequela of hypertension, amyloid angiopathy, or emboli. These results will be called to the ordering clinician or representative by the Radiologist Assistant, and communication documented in the PACS or [employer]. Electronically Signed   By: Margaretha Sheffield MD   On: 02/15/2020 11:02   EEG adult  Result Date: 02/15/2020 Lora Havens, MD     02/15/2020  3:30 PM Patient Name: [name] MRN: [id number] Epilepsy Attending: Lora Havens Referring Physician/Provider: Dr Rosalin Hawking Date: 02/15/2020 Duration: 25.37 mins Patient history: 87yo F with h/o seizure presented after fall and ams. EEG to evaluate for seizure Level of alertness: Awake, asleep AEDs during EEG study: PHT Technical aspects: This EEG study was done with scalp electrodes positioned according to the 10-20 International system of electrode placement. Electrical activity was acquired at a sampling rate of 500Hz  and reviewed with a high frequency filter of 70Hz  and a low frequency filter of 1Hz . EEG data were recorded continuously and digitally stored. Description: No clear posterior dominant rhythm was seen. Sleep was  characterized by sleep spindles (12 to 14 Hz), maximal frontocentral region. EEG showed continuous generalized and maximal left frontal region 3 to 6 Hz theta-delta slowing. Hyperventilation and photic stimulation were not performed.   ABNORMALITY -Continuous slow, generalized and maximal left frontal region IMPRESSION: This study is suggestive of cortical dysfunction in left frontal region likely due to underlying stroke. There is also moderate diffuse encephalopathy, nonspecific  etiology. No seizures or epileptiform discharges were seen throughout the recording. Lora Havens   ECHOCARDIOGRAM COMPLETE  Result Date: 02/15/2020    ECHOCARDIOGRAM REPORT   Patient Name:   SHAWNDELL VARAS Date of Exam: 02/15/2020 Medical Rec #:  [id number]      Height:       62.0 in Accession #:    [id number]     Weight:       110.0 lb Date of Birth:  January 03, 1933      BSA:          1.483 m Patient Age:    56 years       BP:           101/74 mmHg Patient Gender: F              HR:           82 bpm. Exam Location:  Inpatient Procedure: 2D Echo and Intracardiac Opacification Agent Indications:    TIA  History:        Patient has prior history of Echocardiogram examinations, most                 recent 01/31/2018. CHF, TIA, Arrythmias:Atrial Fibrillation; Risk                 Factors:Hypertension.  Sonographer:    Johny Chess Referring Phys: [id number] [place]  1. Left ventricular ejection fraction, by estimation, is 30 to 35%. The left ventricle has moderately decreased function. The left ventricle demonstrates regional wall motion abnormalities (see scoring diagram/findings for description). There is mild left ventricular hypertrophy. Left ventricular diastolic function could not be evaluated. There is severe akinesis of the left ventricular, entire anterior wall, anteroseptal wall, apical segment and inferoapical segment. Findings suggest extensive LAD territory ischemia/infarct or less likely stress-induced  cardimyopathy. The based of the heart is hyperdynamic. No mural thrombus with Definity contrast.  2. Right ventricular systolic function is moderately reduced. The right ventricular size is normal. There is mildly elevated pulmonary artery systolic pressure. The estimated right ventricular systolic pressure is 19.4 mmHg.  3. Left atrial size was moderately dilated.  4. The mitral valve is abnormal. Mild mitral valve regurgitation.  5. The aortic valve is tricuspid. Aortic valve regurgitation is not visualized. Mild aortic valve sclerosis is present, with no evidence of aortic valve stenosis.  6. The inferior vena cava is normal in size with <50% respiratory variability, suggesting right atrial pressure of 8 mmHg. Comparison(s): Changes from prior study are noted. 01/31/2018: LVEF 60-65%. FINDINGS  Left Ventricle: Left ventricular ejection fraction, by estimation, is 30 to 35%. The left ventricle has moderately decreased function. The left ventricle demonstrates regional wall motion abnormalities. Severe akinesis of the left ventricular, entire anterior wall, anteroseptal wall, apical segment and inferoapical segment. Definity contrast agent was given IV to delineate the left ventricular endocardial borders. The left ventricular internal cavity size was normal in size. There is mild left ventricular hypertrophy. Left ventricular diastolic function could not be evaluated due to atrial fibrillation. Left ventricular diastolic function could not be evaluated. Right Ventricle: The right ventricular size is normal. No increase in right ventricular wall thickness. Right ventricular systolic function is moderately reduced. There is mildly elevated pulmonary artery systolic pressure. The tricuspid regurgitant velocity is 2.85 m/s, and with an assumed right atrial pressure of 8 mmHg, the estimated right ventricular systolic pressure is 17.4 mmHg. Left Atrium: Left atrial size was moderately dilated. Right Atrium: Right  atrial  size was normal in size. Pericardium: There is no evidence of pericardial effusion. Mitral Valve: The mitral valve is abnormal. There is moderate thickening of the mitral valve leaflet(s). Mild mitral valve regurgitation. Tricuspid Valve: The tricuspid valve is grossly normal. Tricuspid valve regurgitation is mild. Aortic Valve: The aortic valve is tricuspid. Aortic valve regurgitation is not visualized. Mild aortic valve sclerosis is present, with no evidence of aortic valve stenosis. Pulmonic Valve: The pulmonic valve was grossly normal. Pulmonic valve regurgitation is trivial. Aorta: The aortic root and ascending aorta are structurally normal, with no evidence of dilitation. Venous: The inferior vena cava is normal in size with less than 50% respiratory variability, suggesting right atrial pressure of 8 mmHg. IAS/Shunts: No atrial level shunt detected by color flow Doppler.  LEFT VENTRICLE PLAX 2D LVIDd:         3.80 cm LVIDs:         3.10 cm LV PW:         1.00 cm LV IVS:        1.10 cm LVOT diam:     1.60 cm LV SV:         16 LV SV Index:   11 LVOT Area:     2.01 cm  RIGHT VENTRICLE            IVC RV S prime:     6.83 cm/s  IVC diam: 1.90 cm TAPSE (M-mode): 1.1 cm LEFT ATRIUM             Index       RIGHT ATRIUM           Index LA diam:        4.30 cm 2.90 cm/m  RA Area:     16.10 cm LA Vol (A2C):   42.9 ml 28.93 ml/m RA Volume:   43.10 ml  29.07 ml/m LA Vol (A4C):   60.2 ml 40.60 ml/m LA Biplane Vol: 54.1 ml 36.49 ml/m  AORTIC VALVE LVOT Vmax:   56.80 cm/s LVOT Vmean:  36.400 cm/s LVOT VTI:    0.082 m  AORTA Ao Root diam: 2.60 cm Ao Asc diam:  3.00 cm TRICUSPID VALVE TR Peak grad:   32.5 mmHg TR Vmax:        285.00 cm/s  SHUNTS Systemic VTI:  0.08 m Systemic Diam: 1.60 cm Lyman Bishop MD Electronically signed by Lyman Bishop MD Signature Date/Time: 02/15/2020/5:33:43 PM    Final     Cardiac Studies   Echo  1. Left ventricular ejection fraction, by estimation, is 30 to 35%. The  left ventricle  has moderately decreased function. The left ventricle  demonstrates regional wall motion abnormalities (see scoring  diagram/findings for description). There is mild  left ventricular hypertrophy. Left ventricular diastolic function could  not be evaluated. There is severe akinesis of the left ventricular, entire  anterior wall, anteroseptal wall, apical segment and inferoapical segment.  Findings suggest extensive LAD  territory ischemia/infarct or less likely stress-induced cardimyopathy.  The based of the heart is hyperdynamic. No mural thrombus with Definity  contrast.  2. Right ventricular systolic function is moderately reduced. The right  ventricular size is normal. There is mildly elevated pulmonary artery  systolic pressure. The estimated right ventricular systolic pressure is  47.0 mmHg.  3. Left atrial size was moderately dilated.  4. The mitral valve is abnormal. Mild mitral valve regurgitation.  5. The aortic valve is tricuspid. Aortic valve regurgitation is not  visualized. Mild aortic  valve sclerosis is present, with no evidence of  aortic valve stenosis.  6. The inferior vena cava is normal in size with <50% respiratory  variability, suggesting right atrial pressure of 8 mmHg.  Patient Profile     87 y.o. female  hx of atrial fib who is being seen for the evaluation of decreased EF at the request of Dettinger, Fransisca Kaufmann, MD.  [place] FIB:   Beta blocker increased again today.  Might be able to reduce Cardizem and move up further on beta blocker.  200 XL without Cardizem would be a goal.  Continue anticoagulation.   CARDIOMYOPATHY:  Either large anterior MI but EKG more consistent with Tak[place]ubo.  Enzymes would not suggest this necessarily to be acute.  Medical management.  I might be able to start ARB as an outpatient.    For questions or updates, please contact Elaine Please consult [email or web address] for contact info under  Cardiology/STEMI.   Signed, Minus Breeding, MD  02/17/2020, 9:54 AM

## 2020-02-17 NOTE — Progress Notes (Signed)
Pt sat up in the recliner for lunch.  Max assist by nurse tech and RN.

## 2020-02-17 NOTE — Discharge Instructions (Signed)

## 2020-02-18 DIAGNOSIS — I10 Essential (primary) hypertension: Secondary | ICD-10-CM | POA: Diagnosis not present

## 2020-02-18 DIAGNOSIS — I4891 Unspecified atrial fibrillation: Secondary | ICD-10-CM | POA: Diagnosis not present

## 2020-02-18 LAB — SARS CORONAVIRUS 2 (TAT 6-24 HRS): SARS Coronavirus 2: NEGATIVE

## 2020-02-18 LAB — BASIC METABOLIC PANEL
Anion gap: 11 (ref 5–15)
BUN: 31 mg/dL — ABNORMAL HIGH (ref 8–23)
CO2: 23 mmol/L (ref 22–32)
Calcium: 8.2 mg/dL — ABNORMAL LOW (ref 8.9–10.3)
Chloride: 107 mmol/L (ref 98–111)
Creatinine, Ser: 0.76 mg/dL (ref 0.44–1.00)
GFR, Estimated: >60 mL/min (ref >60)
Glucose, Bld: 118 mg/dL — ABNORMAL HIGH (ref 70–99)
Potassium: 3.7 mmol/L (ref 3.5–5.1)
Sodium: 141 mmol/L (ref 135–145)

## 2020-02-18 LAB — BRAIN NATRIURETIC PEPTIDE: B Natriuretic Peptide: 1842.2 pg/mL — ABNORMAL HIGH (ref 0.0–100.0)

## 2020-02-18 MED ORDER — DIGOXIN 125 MCG PO TABS
0.0625 mg | ORAL_TABLET | Freq: Every day | ORAL | Status: DC
  Administered 2020-02-18 – 2020-02-19 (×2): 0.0625 mg via ORAL
  Filled 2020-02-18 (×2): qty 1

## 2020-02-18 MED ORDER — DIGOXIN 125 MCG PO TABS: 0.1250 mg | ORAL_TABLET | Freq: Every day | ORAL | Status: DC

## 2020-02-18 MED ORDER — LISINOPRIL 2.5 MG PO TABS
2.5000 mg | ORAL_TABLET | Freq: Every day | ORAL | Status: DC
  Administered 2020-02-18: 2.5 mg via ORAL
  Filled 2020-02-18: qty 1

## 2020-02-18 MED ORDER — METOPROLOL SUCCINATE ER 50 MG PO TB24
150.0000 mg | ORAL_TABLET | Freq: Every day | ORAL | Status: DC
  Administered 2020-02-18: 150 mg via ORAL
  Filled 2020-02-18: qty 1

## 2020-02-18 NOTE — Progress Notes (Signed)
Physical Therapy Treatment Patient Details Name: [name] MRN: [id number] DOB: 07/01/1932 Today's Date: 02/18/2020    History of Present Illness 87 y.o. female past medical history significant for advanced dementia, chronic A. fib on systemic anticoagulation, hypothyroidism is brought in by EMS for strokelike symptoms.  In the ED she was found in A. fib. MRI found multiple small acute infarcts involving bilateral frontoparietal lobes, left occipital lobe, and the left caudate.    PT Comments    Pt seen for mobility progression today with continued need for increased assistance with all aspects of mobility. Acute PT to continue during pt's hospital stay.    Follow Up Recommendations  SNF     Equipment Recommendations  Wheelchair (measurements PT);Wheelchair cushion (measurements PT);Hospital bed (mechanical lift)    Precautions / Restrictions Precautions Precautions: Fall Precaution Comments: R hemi, R visual field deficits, R inattention, 2 lpm [place] Restrictions Weight Bearing Restrictions: No    Mobility  Bed Mobility Overal bed mobility: Needs Assistance Bed Mobility: Rolling Rolling: Total assist         General bed mobility comments: total assist to roll right x1, left x1 for pericare. Hand over hand assist at UE's to reach for rail with pt not holding rail. Pt did not attempt to assist. When cued to "bend your knee" the pt repeated the command with no follow through.     Modified Rankin (Stroke Patients Only) Modified Rankin (Stroke Patients Only) Pre-Morbid Rankin Score: Slight disability Modified Rankin: Severe disability     Cognition Arousal/Alertness: Awake/alert Behavior During Therapy: Flat affect Overall Cognitive Status: Impaired/Different from baseline Area of Impairment: Orientation;Attention;Memory;Following commands;Safety/judgement;Awareness;Problem solving                 Orientation Level: Place;Time;Situation Current Attention Level:  Focused Memory: Decreased short-term memory Following Commands: Follows one step commands inconsistently Safety/Judgement: Decreased awareness of safety;Decreased awareness of deficits Awareness: Intellectual Problem Solving: Slow processing;Decreased initiation;Difficulty sequencing General Comments: Patient oriented to person only. Mostly nonverbal with single word answers to questions. Biggest sentence pt said with session is "I am slow".      Exercises General Exercises - Lower Extremity Ankle Circles/Pumps: PROM;Both;10 reps;Supine Heel Slides: PROM;Both;10 reps;Supine Hip ABduction/ADduction: PROM;Both;10 reps;Supine Straight Leg Raises: PROM;Both;10 reps;Supine     Pertinent Vitals/Pain Pain Assessment: No/denies pain Faces Pain Scale: No hurt     PT Goals (current goals can now be found in the care plan section) Acute Rehab PT Goals PT Goal Formulation: Patient unable to participate in goal setting Time For Goal Achievement: 02/29/20 Potential to Achieve Goals: Poor Progress towards PT goals: Progressing toward goals (slowly, more awake this session.)    Frequency    Min 2X/week      PT Plan Current plan remains appropriate    AM-PAC PT "6 Clicks" Mobility   Outcome Measure  Help needed turning from your back to your side while in a flat bed without using bedrails?: Total Help needed moving from lying on your back to sitting on the side of a flat bed without using bedrails?: Total Help needed moving to and from a bed to a chair (including a wheelchair)?: Total Help needed standing up from a chair using your arms (e.g., wheelchair or bedside chair)?: Total Help needed to walk in hospital room?: Total Help needed climbing 3-5 steps with a railing? : Total 6 Click Score: 6    End of Session   Activity Tolerance: Patient limited by lethargy Patient left: in bed;with call bell/phone within  reach;with bed alarm set Nurse Communication: Mobility status PT  Visit Diagnosis: Other abnormalities of gait and mobility (R26.89);Muscle weakness (generalized) (M62.81);Other symptoms and signs involving the nervous system (R29.898);Hemiplegia and hemiparesis Hemiplegia - Right/Left: Right Hemiplegia - caused by: Cerebral infarction     Time: 1411-1430 PT Time Calculation (min) (ACUTE ONLY): 19 min  Charges:  $Therapeutic Exercise: 8-22 mins                     Willow Ora, PTA, [facility] Acute Rehab Services Office- [phone] 02/18/20, 3:03 PM  Willow Ora 02/18/2020, 3:03 PM

## 2020-02-18 NOTE — Progress Notes (Signed)
Cardiology Progress Note  Patient ID: CHERRIE FRANCA MRN: [id number] DOB: 07/17/1932 Date of Encounter: 02/18/2020  Primary Cardiologist: Minus Breeding, MD  Subjective   Chief Complaint: None.  HPI: Sitting quietly in bed.  No complaints.  ROS:  All other ROS reviewed and negative. Pertinent positives noted in the HPI.     Inpatient Medications  Scheduled Meds: . apixaban  2.5 mg Oral BID  . atorvastatin  40 mg Oral QODAY  . busPIRone  10 mg Oral BID  . calcium carbonate  1,250 mg Oral Q breakfast  . digoxin  0.0625 mg Oral Daily  . lacosamide  100 mg Oral BID  . levothyroxine  75 mcg Oral Q0600  . lisinopril  2.5 mg Oral Daily  . metoprolol succinate  150 mg Oral Daily  . sertraline  50 mg Oral Daily  . sodium chloride flush  10-40 mL Intracatheter Q12H  . tamsulosin  0.4 mg Oral Daily  . traZODone  25 mg Oral QHS   Continuous Infusions:  PRN Meds: acetaminophen **OR** acetaminophen (TYLENOL) oral liquid 160 mg/5 mL **OR** acetaminophen, LORazepam, metoprolol tartrate, senna-docusate, sodium chloride flush   Vital Signs   Vitals:   02/17/20 1644 02/17/20 1941 02/17/20 2300 02/18/20 0400  BP: 124/81 138/79 132/68 126/89  Pulse: 92  100 86  Resp: (!) 22   20  Temp: 99.6 F (37.6 C) 97.7 F (36.5 C) 100 F (37.8 C) 98.7 F (37.1 C)  TempSrc: Oral Oral Axillary Axillary  SpO2: 92%  95% 99%  Weight:        Intake/Output Summary (Last 24 hours) at 02/18/2020 0819 Last data filed at 02/18/2020 0600 Gross per 24 hour  Intake 540 ml  Output -  Net 540 ml   Last 3 Weights 02/14/2020 11/19/2019 11/14/2019  Weight (lbs) 110 lb 120 lb 130 lb  Weight (kg) 49.896 kg 54.432 kg 58.968 kg      Telemetry  Overnight telemetry shows atrial fibrillation heart rate in the 90s, frequent PVCs noted, which I personally reviewed.   ECG  The most recent ECG shows A. fib with RVR heart rate 140, deep T wave inversions noted in the anterior lateral leads, which I personally  reviewed.   Physical Exam   Vitals:   02/17/20 1644 02/17/20 1941 02/17/20 2300 02/18/20 0400  BP: 124/81 138/79 132/68 126/89  Pulse: 92  100 86  Resp: (!) 22   20  Temp: 99.6 F (37.6 C) 97.7 F (36.5 C) 100 F (37.8 C) 98.7 F (37.1 C)  TempSrc: Oral Oral Axillary Axillary  SpO2: 92%  95% 99%  Weight:         Intake/Output Summary (Last 24 hours) at 02/18/2020 0819 Last data filed at 02/18/2020 0600 Gross per 24 hour  Intake 540 ml  Output -  Net 540 ml    Last 3 Weights 02/14/2020 11/19/2019 11/14/2019  Weight (lbs) 110 lb 120 lb 130 lb  Weight (kg) 49.896 kg 54.432 kg 58.968 kg    Body mass index is 20.12 kg/m.   General: Well nourished, well developed, in no acute distress Head: Atraumatic, normal size  Eyes: PEERLA, EOMI  Neck: Supple, no JVD Endocrine: No thryomegaly Cardiac: Normal S1, S2; irregular rhythm, no murmurs rubs or gallops Lungs: Diminished breath sounds bilaterally Abd: Soft, nontender, no hepatomegaly  Ext: No edema, pulses 2+ Musculoskeletal: No deformities, BUE and BLE strength normal and equal Skin: Warm and dry, no rashes   Neuro: Alert, awake oriented to person  only  Labs  High Sensitivity Troponin:   Recent Labs  Lab 02/16/20 1621 02/16/20 1906 02/16/20 2255  TROPONINIHS 91* 103* 105*     Cardiac EnzymesNo results for input(s): TROPONINI in the last 168 hours. No results for input(s): TROPIPOC in the last 168 hours.  Chemistry Recent Labs  Lab 02/14/20 1100 02/14/20 1117 02/15/20 2144 02/16/20 0602 02/17/20 0355  NA 132*   < > 135 136 137  K 4.4   < > 4.3 4.2 4.3  CL 96*   < > 103 104 104  CO2 22   < > 20* 20* 23  GLUCOSE 143*   < > 128* 132* 119*  BUN 25*   < > 21 21 29*  CREATININE 1.01*   < > 0.78 0.83 0.78  CALCIUM 8.4*   < > 8.4* 8.5* 8.2*  PROT 6.1*  --   --   --   --   ALBUMIN 3.1*  --   --   --   --   AST 26  --   --   --   --   ALT 22  --   --   --   --   ALKPHOS 117  --   --   --   --   BILITOT 0.6  --   --    --   --   GFRNONAA 55*   < > >60 >60 >60  ANIONGAP 14   < > 12 12 10    < > = values in this interval not displayed.    Hematology Recent Labs  Lab 02/14/20 1100 02/14/20 1117  WBC 11.3*  --   RBC 4.62  --   HGB 14.4 15.0  HCT 45.2 44.0  MCV 97.8  --   MCH 31.2  --   MCHC 31.9  --   RDW 12.3  --   PLT 414*  --    BNPNo results for input(s): BNP, PROBNP in the last 168 hours.  DDimer No results for input(s): DDIMER in the last 168 hours.   Radiology  No results found.  Cardiac Studies  TTE 02/15/2020 1. Left ventricular ejection fraction, by estimation, is 30 to 35%. The  left ventricle has moderately decreased function. The left ventricle  demonstrates regional wall motion abnormalities (see scoring  diagram/findings for description). There is mild  left ventricular hypertrophy. Left ventricular diastolic function could  not be evaluated. There is severe akinesis of the left ventricular, entire  anterior wall, anteroseptal wall, apical segment and inferoapical segment.  Findings suggest extensive LAD  territory ischemia/infarct or less likely stress-induced cardimyopathy.  The based of the heart is hyperdynamic. No mural thrombus with Definity  contrast.  2. Right ventricular systolic function is moderately reduced. The right  ventricular size is normal. There is mildly elevated pulmonary artery  systolic pressure. The estimated right ventricular systolic pressure is  76.1 mmHg.  3. Left atrial size was moderately dilated.  4. The mitral valve is abnormal. Mild mitral valve regurgitation.  5. The aortic valve is tricuspid. Aortic valve regurgitation is not  visualized. Mild aortic valve sclerosis is present, with no evidence of  aortic valve stenosis.  6. The inferior vena cava is normal in size with <50% respiratory  variability, suggesting right atrial pressure of 8 mmHg.   Patient Profile  SUANN KLIER is a 87 y.o. female with advanced dementia,  permanent atrial fibrillation on edoxaban, hypothyroidism who was admitted on 02/14/2020 for acute stroke and  A. fib with RVR.  Assessment & Plan   1.  Acute stroke -She was on edoxaban.  Switched to Eliquis by cardiology last week.  We will continue this.  2.  A. fib with RVR -History of permanent A. fib.  Now on Eliquis. -Recommend rate control with digoxin and metoprolol succinate.  I have increased her metoprolol succinate to 150 mg daily.  Her diltiazem has been stopped given her cardiomyopathy.  3.  New onset systolic heart failure, EF 30-35% -I have reviewed her echocardiogram.  She has multiple wall motion abnormalities that do not fit 1 coronary distribution.  Troponins are minimally elevated and flat.  EKG consistent with a stress-induced pattern as well. -Overall I am fairly certain this is a stress-induced or Takotsubo cardiomyopathy.  Would recommend optimize medical therapy.  Left heart catheterization not indicated given her overall advanced dementia and frail state. -Transition to metoprolol succinate 150 mg daily. -I added lisinopril 2.5 mg daily. -She is euvolemic.  4.  Frequent PVCs -Secondary to cardiomyopathy.  I have ordered a BMP today.  We need to make sure that her potassium is above 4 magnesium is above 2. -Continue beta-blocker.  For questions or updates, please contact Gem Please consult [email or web address] for contact info under   Time Spent with Patient: I have spent a total of 25 minutes with patient reviewing hospital notes, telemetry, EKGs, labs and examining the patient as well as establishing an assessment and plan that was discussed with the patient.  > 50% of time was spent in direct patient care.    Signed, Addison Naegeli. Audie Box, [place]  02/18/2020 8:19 AM

## 2020-02-18 NOTE — Care Management Important Message (Signed)
Important Message  Patient Details  Name: [name] MRN: [id number] Date of Birth: Sep 14, 1932   Medicare Important Message Given:  Yes     [name] [name] 02/18/2020, 2:19 PM

## 2020-02-18 NOTE — Progress Notes (Signed)
[facility] PROGRESS NOTE    Progress Note  [name]  ZOX:[id number] DOB: [birth year] DOA: 02/14/2020 PCP: Dettinger, Fransisca Kaufmann, MD     Brief Narrative:   [name] is an 87 y.o. female past medical history significant for advanced dementia, chronic A. fib on systemic anticoagulation, hypothyroidism is brought in by EMS for strokelike symptoms.  In the ED she was found in A. fib started on Cardizem drip.  Significant Events: 02/14/2020 admission  Significant studies: 02/14/2020 CT of the head showed no acute findings. 02/14/2020 CTA was negative for large caliber occlusion 02/14/2020 MRI of the brain showed multiple small acute infarcts involving the bilateral frontal parietal left occipital lobe with edema without mass-effect, advanced chronic microvascular ischemic changes bilateral cerebral microhemorrhage 2D echo an EF of 30% with severe akinesia of the left ventricular entire anterior wall, anteroseptal wall and apical segment.  Reduced right ventricular systolic function moderately reduced with a PA pressure of 40 mmHg.  Bilateral atrial enlargement.  Antibiotics: None  Microbiology data: Blood culture:  Procedures: None  Assessment/Plan:   CVA: Speech evaluated the patient recommended a dysphagia 2 diet. Continue oral Eliquis. Physical therapy evaluated the patient and recommended skilled nursing facility. We will consult TOC for skilled nursing facility placement. We will consult palliative care to discuss end-of-life  A. fib with RVR: Cardiology recommended to discontinue diltiazem increase metoprolol. Heart rate in the 90s- low 100's  New wall motion abnormality: Cardiology evaluated the patient recommended no further work-up after discussing with the son.  Chronic diastolic CHF (congestive heart failure) ([place]) Noted she appears euvolemic hold diuretic therapy.  And antihypertensive medications to allow permissive hypertension.  Essential  hypertension: Well-controlled, tapering down diltiazem and increasing metoprolol. Blood pressure seems to be relatively well controlled.  Seizure disorder No seizure event on Vimpat will continue current medication. Continue Ativan as needed for possible seizures.    DVT prophylaxis: DOAC Family Communication:daughter Status is: Inpatient  Remains inpatient appropriate because:Hemodynamically unstable   Dispo: The patient is from: SNF              Anticipated d/c is to: SNF              Anticipated d/c date is: 1 days              Patient currently is medically stable to d/c.   Difficult to place patient No Code Status:     Code Status Orders  (From admission, onward)         Start     Ordered   02/14/20 1332  Limited resuscitation (code)  Continuous       Question Answer Comment  In the event of cardiac or respiratory ARREST: Initiate Code Blue, Call Rapid Response Yes   In the event of cardiac or respiratory ARREST: Perform CPR No   In the event of cardiac or respiratory ARREST: Perform Intubation/Mechanical Ventilation No   In the event of cardiac or respiratory ARREST: Use NIPPV/BiPAp only if indicated Yes   In the event of cardiac or respiratory ARREST: Administer ACLS medications if indicated Yes   In the event of cardiac or respiratory ARREST: Perform Defibrillation or Cardioversion if indicated Yes      02/14/20 1333        Code Status History    Date Active Date Inactive Code Status Order ID Comments User Context   03/01/2018 1949 03/08/2018 0028 Partial Code [id number]  Bethena Roys, MD Inpatient   01/30/2018  1849 02/03/2018 1726 Full Code [id number]  Cristy Folks, MD Inpatient   03/15/2017 1612 03/17/2017 1713 Full Code [id number]  Phillips Grout, MD ED   01/28/2017 0304 01/28/2017 1933 Full Code [id number]  Vianne Bulls, MD ED   11/13/2016 0549 11/13/2016 1704 Full Code [id number]  Jani Gravel, MD Inpatient   06/22/2016 1337 06/22/2016 2104 Full Code  [id number]  Troy Sine, MD Inpatient   04/23/2016 1543 04/26/2016 1934 DNR [id number]  Eber Jones, MD Inpatient   04/23/2016 1543 04/23/2016 1543 DNR [id number]  Eber Jones, MD Inpatient   09/29/2015 0215 09/29/2015 2119 Full Code [id number]  Oswald Hillock, MD Inpatient   04/21/2015 1506 04/22/2015 1811 Full Code [id number]  Orvan Falconer, MD ED   01/12/2015 0052 01/16/2015 1512 Full Code [id number]  Edwin Dada, MD Inpatient   11/17/2010 1810 11/20/2010 1310 Full Code [id number]  Marcy Panning, RN Inpatient   Advance Care Planning Activity        IV Access:    Peripheral IV   Procedures and diagnostic studies:   No results found.   Medical Consultants:    None.   Subjective:    [name] no new complaints  Objective:    Vitals:   02/17/20 1644 02/17/20 1941 02/17/20 2300 02/18/20 0400  BP: 124/81 138/79 132/68 126/89  Pulse: 92  100 86  Resp: (!) 22   20  Temp: 99.6 F (37.6 C) 97.7 F (36.5 C) 100 F (37.8 C) 98.7 F (37.1 C)  TempSrc: Oral Oral Axillary Axillary  SpO2: 92%  95% 99%  Weight:       SpO2: 99 % O2 Flow Rate (L/min): 2 L/min   Intake/Output Summary (Last 24 hours) at 02/18/2020 0811 Last data filed at 02/18/2020 0600 Gross per 24 hour  Intake 540 ml  Output --  Net 540 ml   Filed Weights   02/14/20 1600  Weight: 49.9 kg    Exam: General exam: In no acute distress. Respiratory system: Good air movement and clear to auscultation. Cardiovascular system: S1 & S2 heard, RRR. No JVD. Gastrointestinal system: Abdomen is nondistended, soft and nontender.  Extremities: No pedal edema. Skin: No rashes, lesions or ulcers Psychiatry: Judgement and insight appear normal. Mood & affect appropriate.  Data Reviewed:    Labs: Basic Metabolic Panel: Recent Labs  Lab 02/14/20 1100 02/14/20 1117 02/15/20 0536 02/15/20 2144 02/16/20 0602 02/17/20 0355  NA 132* 132* 133* 135 136 137  K 4.4 4.1 5.2* 4.3 4.2 4.3   CL 96* 95* 98 103 104 104  CO2 22  --  17* 20* 20* 23  GLUCOSE 143* 144* 136* 128* 132* 119*  BUN 25* 28* 20 21 21  29*  CREATININE 1.01* 0.80 0.69 0.78 0.83 0.78  CALCIUM 8.4*  --  8.7* 8.4* 8.5* 8.2*   GFR Estimated Creatinine Clearance: 40.5 mL/min (by C-G formula based on SCr of 0.78 mg/dL). Liver Function Tests: Recent Labs  Lab 02/14/20 1100  AST 26  ALT 22  ALKPHOS 117  BILITOT 0.6  PROT 6.1*  ALBUMIN 3.1*   No results for input(s): LIPASE, AMYLASE in the last 168 hours. No results for input(s): AMMONIA in the last 168 hours. Coagulation profile Recent Labs  Lab 02/14/20 1100  INR 1.1   COVID-19 Labs  No results for input(s): DDIMER, FERRITIN, LDH, CRP in the last 72 hours.  Lab Results  Component Value Date   [place] NEGATIVE 02/14/2020    CBC: Recent Labs  Lab 02/14/20 1100 02/14/20 1117  WBC 11.3*  --   NEUTROABS 8.7*  --   HGB 14.4 15.0  HCT 45.2 44.0  MCV 97.8  --   PLT 414*  --    Cardiac Enzymes: No results for input(s): CKTOTAL, CKMB, CKMBINDEX, TROPONINI in the last 168 hours. BNP (last 3 results) No results for input(s): PROBNP in the last 8760 hours. CBG: Recent Labs  Lab 02/14/20 1110  GLUCAP 148*   D-Dimer: No results for input(s): DDIMER in the last 72 hours. Hgb A1c: No results for input(s): HGBA1C in the last 72 hours. Lipid Profile: No results for input(s): CHOL, HDL, LDLCALC, TRIG, CHOLHDL, LDLDIRECT in the last 72 hours. Thyroid function studies: No results for input(s): TSH, T4TOTAL, T3FREE, THYROIDAB in the last 72 hours.  Invalid input(s): FREET3 Anemia work up: No results for input(s): VITAMINB12, FOLATE, FERRITIN, TIBC, IRON, RETICCTPCT in the last 72 hours. Sepsis Labs: Recent Labs  Lab 02/14/20 1100  WBC 11.3*   Microbiology Recent Results (from the past 240 hour(s))  Resp Panel by RT-PCR (Flu A&B, Covid) Nasopharyngeal Swab     Status: None   Collection Time: 02/14/20 12:14 PM   Specimen:  Nasopharyngeal Swab; Nasopharyngeal(NP) swabs in vial transport medium  Result Value Ref Range Status   SARS Coronavirus 2 by RT PCR NEGATIVE NEGATIVE Final    Comment: (NOTE) SARS-CoV-2 target nucleic acids are NOT DETECTED.  The SARS-CoV-2 RNA is generally detectable in upper respiratory specimens during the acute phase of infection. The lowest concentration of SARS-CoV-2 viral copies this assay can detect is 138 copies/mL. A negative result does not preclude SARS-Cov-2 infection and should not be used as the sole basis for treatment or other patient management decisions. A negative result may occur with  improper specimen collection/handling, submission of specimen other than nasopharyngeal swab, presence of viral mutation(s) within the areas targeted by this assay, and inadequate number of viral copies(<138 copies/mL). A negative result must be combined with clinical observations, patient history, and epidemiological information. The expected result is Negative.  Fact Sheet for Patients:  EntrepreneurPulse.com.au  Fact Sheet for Healthcare Providers:  IncredibleEmployment.be  This test is no t yet approved or cleared by the [place] FDA and  has been authorized for detection and/or diagnosis of SARS-CoV-2 by FDA under an Emergency Use Authorization (EUA). This EUA will remain  in effect (meaning this test can be used) for the duration of the COVID-19 declaration under Section 564(b)(1) of the Act, 21 U.S.C.section 360bbb-3(b)(1), unless the authorization is terminated  or revoked sooner.       Influenza A by PCR NEGATIVE NEGATIVE Final   Influenza B by PCR NEGATIVE NEGATIVE Final    Comment: (NOTE) The Xpert Xpress SARS-CoV-2/FLU/RSV plus assay is intended as an aid in the diagnosis of influenza from Nasopharyngeal swab specimens and should not be used as a sole basis for treatment. Nasal washings and aspirates are unacceptable for  Xpert Xpress SARS-CoV-2/FLU/RSV testing.  Fact Sheet for Patients: EntrepreneurPulse.com.au  Fact Sheet for Healthcare Providers: IncredibleEmployment.be  This test is not yet approved or cleared by the [place] FDA and has been authorized for detection and/or diagnosis of SARS-CoV-2 by FDA under an Emergency Use Authorization (EUA). This EUA will remain in effect (meaning this test can be used) for the duration of the COVID-19 declaration under Section 564(b)(1) of the Act, 21 U.S.C. section 360bbb-3(b)(1), unless the authorization is terminated or revoked.  Performed at [facility], [place]  [address], [place], [place] [address]      Medications:   . apixaban  2.5 mg Oral BID  . atorvastatin  40 mg Oral QODAY  . busPIRone  10 mg Oral BID  . calcium carbonate  1,250 mg Oral Q breakfast  . digoxin  0.125 mg Oral Daily  . lacosamide  100 mg Oral BID  . levothyroxine  75 mcg Oral Q0600  . lisinopril  2.5 mg Oral Daily  . metoprolol succinate  150 mg Oral Daily  . sertraline  50 mg Oral Daily  . sodium chloride flush  10-40 mL Intracatheter Q12H  . tamsulosin  0.4 mg Oral Daily  . traZODone  25 mg Oral QHS   Continuous Infusions:     LOS: 4 days   Charlynne Cousins  [facility]  02/18/2020, 8:11 AM

## 2020-02-18 NOTE — TOC Benefit Eligibility Note (Signed)
Transition of Care [facility]) Benefit Eligibility Note    Patient Details  Name: [name] MRN: [id number] Date of Birth: Jun 09, 1932   Medication/Dose: Eliquis 5mg .bid 30 day supply  Covered?: Yes  Tier: 3 Drug  Prescription Coverage Preferred Pharmacy: [facility] with Person/Company/Phone Number:: Theone Murdoch. [facility].PH# [phone]  Co-Pay: $47.00  Prior Approval: No  Deductible:  (no- deductible)       Shelda Altes Phone Number: 02/18/2020, 9:03 AM

## 2020-02-18 NOTE — Consult Note (Incomplete)
Consultation Note Date: 02/18/2020   Patient Name: [name]  DOB: Feb 10, 1932  MRN: [id number]  Age / Sex: 87 y.o., female  PCP: Dettinger, Fransisca Kaufmann, MD Referring Physician: Aileen Fass, Tammi Klippel, MD  Reason for Consultation: {Reason for Consult:23484}  HPI/Patient Profile: 87 y.o. female  admitted on 02/14/2020 with   [name] is a 87 y.o. female with medical history significant of advanced dementia, chronic A. fib on systemic anticoagulation, HTN, HLD, hypothyroidism, anxiety/depression, presented with strokelike symptoms.  She has been a resident of [facility] for past 6 monhts  Patient demented at baseline, unable to provide any history, son reported that the patient might have a mini stroke in the past but denied patient had any residual weakness or facial droop. In addition, most history provided by ER staff and physician.  Patient was last seen normal around 1000 AM this morning eating and 1005 AM patient was found on the floor, awake and more confused.  Nursing home staff tried to pick the patient up and found patient had an new right-sided weakness. ED Course: Code stroke was called, CT head negative for acute finding, CTA negative for large caliber occlusion in brain and neck vessels.  And patient was found to be in rapid A. fib.  Cardizem drip started in the ED.     Family face treatment option decisions, advanced directive decisions and anticipatory care needs.    Clinical Assessment and Goals of Care:   This NP Wadie Lessen reviewed medical records, received report from team, assessed the patient and then spoke to  patient's patient's son by telephone  to discuss diagnosis, prognosis, GOC, EOL wishes disposition and options.   Concept of Palliative Care was introduced as specialized medical care for people and their families living with serious illness.  If focuses on providing relief  from the symptoms and stress of a serious illness.  The goal is to improve quality of life for both the patient and the family.  Created space and opportunity for patient  and family to explore thoughts and feelings regarding current medical information     A  discussion was had today regarding advanced directives.  Concepts specific to code status, artifical feeding and hydration, continued IV antibiotics and rehospitalization was had.  The difference between a aggressive medical intervention path  and a palliative comfort care path for this patient at this time was had.  Values and goals of care important to patient and family were attempted to be elicited.   MOST form discussed    Natural trajectory and expectations at EOL were discussed.  Questions and concerns addressed.  Patient  encouraged to call with questions or concerns.     PMT will continue to support holistically.             {Primary Decision KPTWS:56812}    SUMMARY OF RECOMMENDATIONS    Code Status/Advance Care Planning:  Limited code   Patient will return to DNR/DNI status on discharge, see MOST form in [place]   Palliative Prophylaxis:  Aspiration, Bowel Regimen, Frequent Pain Assessment and Oral Care  Additional Recommendations (Limitations, Scope, Preferences):  Avoid Hospitalization  Psycho-social/Spiritual:   Desire for further Chaplaincy support:no  Additional Recommendations: Education on Hospice  Prognosis:   Unable to determine  Discharge Planning: [place] for rehab with Palliative care service follow-up      Primary Diagnoses: Present on Admission: . Chronic diastolic CHF (congestive heart failure) ([place])   I have reviewed the medical record, interviewed the patient and family, and examined the patient. The following aspects are pertinent.  Past Medical History:  Diagnosis Date  . Anemia   . Atrial fibrillation ([place])   . Calf pain    September, 2012, at rest   . Carotid bruit    Doppler, December, 2009, no abnormality  . Cataract   . Diverticulosis   . GERD (gastroesophageal reflux disease)   . History of shingles   . HLD (hyperlipidemia)   . HTN (hypertension)   . Hypothyroidism   . Insomnia   . Osteoarthritis   . PONV (postoperative nausea and vomiting)   . Rectal bleeding 2001   diverticulosis and int hemorrhoids on 07/1999 and 02/2010 colonoscopies.  . Seizures ([place])   . Stroke ([place])   . Warfarin anticoagulation    Social History   Socioeconomic History  . Marital status: Widowed    Spouse name: Not on file  . Number of children: 1  . Years of education: Not on file  . Highest education level: Not on file  Occupational History  . Occupation: retired    Comment: self - hair-dresser  Tobacco Use  . Smoking status: Never Smoker  . Smokeless tobacco: Never Used  Vaping Use  . Vaping Use: Never used  Substance and Sexual Activity  . Alcohol use: No  . Drug use: No  . Sexual activity: Never  Other Topics Concern  . Not on file  Social History Narrative   Right handed    Split level. Only one step to go outside   Lives with sister   Social Determinants of Health   Financial Resource Strain: Not on file  Food Insecurity: Not on file  Transportation Needs: Not on file  Physical Activity: Not on file  Stress: Not on file  Social Connections: Not on file   Family History  Problem Relation Age of Onset  . COPD Brother   . Atrial fibrillation Brother   . Diabetes Brother   . Liver cancer Brother        Alcohol-related  . Colon cancer Brother   . Heart attack Father   . Early death Father   . Heart disease Father   . Rheum arthritis Mother   . Arthritis Mother   . Heart disease Mother   . Coronary artery disease Brother   . Heart attack Daughter   . Gallbladder disease Brother    Scheduled Meds: . apixaban  2.5 mg Oral BID  . atorvastatin  40 mg Oral QODAY  . busPIRone  10 mg Oral BID  . calcium carbonate   1,250 mg Oral Q breakfast  . digoxin  0.0625 mg Oral Daily  . lacosamide  100 mg Oral BID  . levothyroxine  75 mcg Oral Q0600  . lisinopril  2.5 mg Oral Daily  . metoprolol succinate  150 mg Oral Daily  . sertraline  50 mg Oral Daily  . sodium chloride flush  10-40 mL Intracatheter Q12H  . tamsulosin  0.4 mg Oral Daily  . traZODone  25 mg Oral QHS   Continuous Infusions: PRN Meds:.acetaminophen **OR** acetaminophen (TYLENOL) oral liquid 160 mg/5 mL **OR** acetaminophen, LORazepam, metoprolol tartrate, senna-docusate, sodium chloride flush Medications Prior to Admission:  Prior to Admission medications   Medication Sig Start Date End Date Taking? Authorizing Provider  acetaminophen (TYLENOL) 325 MG tablet Take 650 mg by mouth See admin instructions. Take 650 mg by mouth two times a day and an additional 650 mg every six hours as needed for mild pain   Yes [provider]  atorvastatin (LIPITOR) 20 MG tablet Take 1 tablet (20 mg total) by mouth every other day. 09/14/19  Yes Dettinger, Fransisca Kaufmann, MD  busPIRone (BUSPAR) 10 MG tablet Take 1 tablet (10 mg total) by mouth 2 (two) times daily. 10/24/19  Yes Dettinger, Fransisca Kaufmann, MD  Calcium Carbonate (CALCIUM 500 PO) Take 500 mg by mouth daily.   Yes [provider]  clotrimazole-betamethasone (LOTRISONE) cream Apply topically 2 times daily to affected areas until rash clears Patient taking differently: Apply 1 application topically See admin instructions. Apply to affected areas 2 times a day until rash clears 01/22/19  Yes Dettinger, Fransisca Kaufmann, MD  Dextran 70-Hypromellose, PF, 0.1-0.3 % SOLN Place 2 drops into the left eye daily as needed (for dryness).   Yes [provider]  digoxin (LANOXIN) 0.125 MG tablet Take 0.5 tablets (0.0625 mg total) by mouth daily. 04/12/19  Yes Minus Breeding, MD  diltiazem (CARDIZEM LA) 240 MG 24 hr tablet Take 240 mg by mouth daily.   Yes [provider]  donepezil (ARICEPT) 5 MG tablet  TAKE ONE TABLET AT BEDTIME FOR MEMORY Patient taking differently: Take 5 mg by mouth at bedtime. 08/08/19  Yes Cameron Sprang, MD  hydrALAZINE (APRESOLINE) 10 MG tablet TAKE 1 TABLET 2 TIMES A DAY Patient taking differently: Take 10 mg by mouth in the morning and at bedtime. 10/24/19  Yes Dettinger, Fransisca Kaufmann, MD  hydrocortisone (ANUSOL-HC) 25 MG suppository Place 1 suppository (25 mg total) rectally 2 (two) times daily. Patient taking differently: Place 25 mg rectally 2 (two) times daily as needed (AS DIRECTED). 08/16/19  Yes Dettinger, Fransisca Kaufmann, MD  [facility]) OINT Apply 1 application topically See admin instructions. Apply to buttocks after incontinence and as needed/as directed 02/10/20  Yes [provider]  levothyroxine (SYNTHROID) 75 MCG tablet TAKE 1 TABLET ONCE DAILY Patient taking differently: Take 75 mcg by mouth in the morning. 09/21/19  Yes Dettinger, Fransisca Kaufmann, MD  Lidocaine 4 % PTCH Apply 1 patch topically See admin instructions. Apply 1 patch to the lower back and leave in place for 12 hours- remove afterwards   Yes [provider]  loperamide (IMODIUM) 2 MG capsule TAKE (1) CAPSULE FOUR TIMES DAILY AS NEEDED FOR DIARRHEA OR LOOSE STOOLS Patient taking differently: Take 2 mg by mouth 4 (four) times daily as needed for diarrhea or loose stools. 04/11/19  Yes Dettinger, Fransisca Kaufmann, MD  melatonin 5 MG TABS Take 5 mg by mouth at bedtime.   Yes [provider]  Multiple Vitamin (MULTIVITAMIN WITH MINERALS) TABS tablet Take 1 tablet by mouth every morning.   Yes [provider]  NON FORMULARY Take 120 mLs by mouth See admin instructions. MedPass- Drink 120 ml's by mouth three times a day with medications   Yes [provider]  phenytoin (DILANTIN) 100 MG ER capsule Take 1 capsule (100 mg total) by mouth 3 (three) times daily. 08/08/19  Yes Cameron Sprang, MD  potassium chloride SA (KLOR-CON) 20 MEQ tablet Take 1 tablet (20 mEq total)  by mouth daily. 11/14/19  Yes Sherwood Gambler, MD  SAVAYSA 30 MG TABS tablet TAKE 1 TABLET ONCE A DAY FOR AFIB Patient taking differently: Take 30 mg by mouth daily. 10/25/19  Yes Minus Breeding, MD  sertraline (ZOLOFT) 50 MG tablet Take 1 tablet (50 mg total) by mouth daily. 09/14/19  Yes Dettinger, Fransisca Kaufmann, MD  shark liver oil-cocoa butter (PREPARATION H) 0.25-88.44 % suppository Place 1 suppository rectally 2 (two) times daily.   Yes [provider]  tamsulosin (FLOMAX) 0.4 MG CAPS capsule TAKE (1) CAPSULE DAILY Patient taking differently: Take 0.4 mg by mouth daily. 08/30/19  Yes Dettinger, Fransisca Kaufmann, MD  TOPROL XL 100 MG 24 hr tablet Take 100 mg by mouth in the morning. Take with or immediately following a meal.   Yes [provider]  traZODone (DESYREL) 50 MG tablet TAKE 1/2 TO 1 TABLET AT BEDTIME AS NEEDED FOR SLEEP Patient taking differently: Take 25 mg by mouth at bedtime. 10/25/19  Yes Dettinger, Fransisca Kaufmann, MD  diltiazem (CARDIZEM CD) 240 MG 24 hr capsule TAKE (1) CAPSULE DAILY Patient not taking: No sig reported 06/07/19   Minus Breeding, MD  GNP MELATONIN MAXIMUM STRENGTH 5 MG TABS TAKE ONE TABLET AT BEDTIME Patient not taking: No sig reported 10/24/19   Dettinger, Fransisca Kaufmann, MD  metoprolol succinate (TOPROL-XL) 100 MG 24 hr tablet TAKE 1 TABLET EVERY DAY FOLLOWING A MEAL Patient not taking: No sig reported 08/30/19   Dettinger, Fransisca Kaufmann, MD  phenylephrine (HEMORRHOIDAL) 0.25 % suppository Place 1 suppository rectally 2 (two) times daily. Patient not taking: Reported on 02/14/2020 08/31/19   Dettinger, Fransisca Kaufmann, MD   Allergies  Allergen Reactions  . Penicillins Rash and Other (See Comments)    Has patient had a PCN reaction causing immediate rash, facial/tongue/throat swelling, SOB or lightheadedness with hypotension: No Has patient had a PCN reaction causing severe rash involving mucus membranes or skin necrosis: No Has patient had a PCN reaction that required  hospitalization No Has patient had a PCN reaction occurring within the last 10 years: No If all of the above answers are "NO", then may proceed with Cephalosporin use.   Review of Systems  Unable to perform ROS: Dementia    Physical Exam  Vital Signs: BP 125/78 (BP Location: Right Arm)   Pulse 85   Temp 99.2 F (37.3 C) (Oral)   Resp 18   Wt 49.9 kg   SpO2 97%   BMI 20.12 kg/m  Pain Scale: 0-10   Pain Score: 0-No pain   SpO2: SpO2: 97 % O2 Device:SpO2: 97 % O2 Flow Rate: .O2 Flow Rate (L/min): 2 L/min  IO: Intake/output summary:   Intake/Output Summary (Last 24 hours) at 02/18/2020 1519 Last data filed at 02/18/2020 0600 Gross per 24 hour  Intake 240 ml  Output -  Net 240 ml    LBM: Last BM Date: 02/18/20 Baseline Weight: Weight: 49.9 kg Most recent weight: Weight: 49.9 kg     Palliative Assessment/Data:     Time In: 1430 Time Out: 1530 Time Total: 60 minutes Greater than 50%  of this time was spent counseling and coordinating care related to the above assessment and plan.  Signed by: Wadie Lessen, NP   Please contact Palliative Medicine Team phone at [phone] for questions and concerns.  For individual provider: See Shea Evans

## 2020-02-18 NOTE — Consult Note (Signed)
   [facility] CM Inpatient Consult   02/18/2020  Cerise N Ram 06/30/1932 [id number]  [facility] [[facility]]  Accountable Care Organization [ACO] Patient: [employer] Medicare  Patient is noted to from [facility]   Patient was screened for [facility] [[facility]]  Care Management services. Patient was noted to have the transition of care call conducted by the primary care provider as this patient's son had declined patient to be in a chronic care management program in [facility] Embedded practice.   Plan:  Will follow progress and disposition and make referral to [facility] CCM team if patient returns home/community appropriate and son agrees on post hospital follow up needs. If patient returns to SNF her community transition will be met at a skilled nursing facility level of care as patient is currently a long term care resident noted.  Please contact for further questions,   , RN BSN CCM [facility] Hospital Liaison  [phone] business mobile phone Toll free office [phone]  Fax number: [phone] [email or web address]    

## 2020-02-18 NOTE — TOC Progression Note (Signed)
Transition of Care [facility]) - Progression Note    Patient Details  Name: [name] MRN: [id number] Date of Birth: 1932-10-23  Transition of Care [facility]) CM/SW [place], [place] Phone Number: 02/18/2020, 4:00 PM  Clinical Narrative:   CSW notified by MD to plan for discharge back to [facility] tomorrow. CSW alerted [facility] and spoke with patient's son, [name]. Ray discussed concerns that the patient is needing more assistance now than prior to hospitalization. CSW asked if he'd want to try rehab upon return to [facility], and he would. CSW updated [facility] administration, they are in agreement. CSW sent in request to [facility] Medicare for review. CSW to follow.    Expected Discharge Plan: [place] Barriers to Discharge: Continued Medical Work up  Expected Discharge Plan and Services Expected Discharge Plan: [place] Choice: [place] arrangements for the past 2 months: [place]                                       Social Determinants of Health (SDOH) Interventions    Readmission Risk Interventions No flowsheet data found.

## 2020-02-19 DIAGNOSIS — G9340 Encephalopathy, unspecified: Secondary | ICD-10-CM | POA: Diagnosis not present

## 2020-02-19 DIAGNOSIS — I69359 Hemiplegia and hemiparesis following cerebral infarction affecting unspecified side: Secondary | ICD-10-CM | POA: Diagnosis not present

## 2020-02-19 DIAGNOSIS — F32A Depression, unspecified: Secondary | ICD-10-CM | POA: Diagnosis not present

## 2020-02-19 DIAGNOSIS — I4891 Unspecified atrial fibrillation: Secondary | ICD-10-CM | POA: Diagnosis not present

## 2020-02-19 DIAGNOSIS — E038 Other specified hypothyroidism: Secondary | ICD-10-CM | POA: Diagnosis not present

## 2020-02-19 DIAGNOSIS — Z743 Need for continuous supervision: Secondary | ICD-10-CM | POA: Diagnosis not present

## 2020-02-19 DIAGNOSIS — Z7401 Bed confinement status: Secondary | ICD-10-CM | POA: Diagnosis not present

## 2020-02-19 DIAGNOSIS — Z515 Encounter for palliative care: Secondary | ICD-10-CM | POA: Diagnosis not present

## 2020-02-19 DIAGNOSIS — I6990 Unspecified sequelae of unspecified cerebrovascular disease: Secondary | ICD-10-CM | POA: Diagnosis not present

## 2020-02-19 DIAGNOSIS — I5032 Chronic diastolic (congestive) heart failure: Secondary | ICD-10-CM | POA: Diagnosis not present

## 2020-02-19 DIAGNOSIS — R404 Transient alteration of awareness: Secondary | ICD-10-CM | POA: Diagnosis not present

## 2020-02-19 DIAGNOSIS — E039 Hypothyroidism, unspecified: Secondary | ICD-10-CM | POA: Diagnosis not present

## 2020-02-19 DIAGNOSIS — Z1152 Encounter for screening for COVID-19: Secondary | ICD-10-CM | POA: Diagnosis not present

## 2020-02-19 DIAGNOSIS — I5030 Unspecified diastolic (congestive) heart failure: Secondary | ICD-10-CM | POA: Diagnosis not present

## 2020-02-19 DIAGNOSIS — I4819 Other persistent atrial fibrillation: Secondary | ICD-10-CM | POA: Diagnosis not present

## 2020-02-19 DIAGNOSIS — M810 Age-related osteoporosis without current pathological fracture: Secondary | ICD-10-CM | POA: Diagnosis not present

## 2020-02-19 DIAGNOSIS — R531 Weakness: Secondary | ICD-10-CM | POA: Diagnosis not present

## 2020-02-19 DIAGNOSIS — R197 Diarrhea, unspecified: Secondary | ICD-10-CM | POA: Diagnosis not present

## 2020-02-19 DIAGNOSIS — G459 Transient cerebral ischemic attack, unspecified: Secondary | ICD-10-CM | POA: Diagnosis not present

## 2020-02-19 DIAGNOSIS — E7849 Other hyperlipidemia: Secondary | ICD-10-CM | POA: Diagnosis not present

## 2020-02-19 DIAGNOSIS — I10 Essential (primary) hypertension: Secondary | ICD-10-CM | POA: Diagnosis not present

## 2020-02-19 DIAGNOSIS — G309 Alzheimer's disease, unspecified: Secondary | ICD-10-CM | POA: Diagnosis not present

## 2020-02-19 DIAGNOSIS — I639 Cerebral infarction, unspecified: Secondary | ICD-10-CM | POA: Diagnosis not present

## 2020-02-19 DIAGNOSIS — M6281 Muscle weakness (generalized): Secondary | ICD-10-CM | POA: Diagnosis not present

## 2020-02-19 DIAGNOSIS — M2550 Pain in unspecified joint: Secondary | ICD-10-CM | POA: Diagnosis not present

## 2020-02-19 DIAGNOSIS — I6389 Other cerebral infarction: Secondary | ICD-10-CM | POA: Diagnosis not present

## 2020-02-19 DIAGNOSIS — I110 Hypertensive heart disease with heart failure: Secondary | ICD-10-CM | POA: Diagnosis not present

## 2020-02-19 DIAGNOSIS — R278 Other lack of coordination: Secondary | ICD-10-CM | POA: Diagnosis not present

## 2020-02-19 DIAGNOSIS — E878 Other disorders of electrolyte and fluid balance, not elsewhere classified: Secondary | ICD-10-CM | POA: Diagnosis not present

## 2020-02-19 DIAGNOSIS — G9341 Metabolic encephalopathy: Secondary | ICD-10-CM | POA: Diagnosis not present

## 2020-02-19 LAB — BASIC METABOLIC PANEL
Anion gap: 12 (ref 5–15)
BUN: 23 mg/dL (ref 8–23)
CO2: 24 mmol/L (ref 22–32)
Calcium: 8.2 mg/dL — ABNORMAL LOW (ref 8.9–10.3)
Chloride: 107 mmol/L (ref 98–111)
Creatinine, Ser: 0.6 mg/dL (ref 0.44–1.00)
GFR, Estimated: >60 mL/min (ref >60)
Glucose, Bld: 97 mg/dL (ref 70–99)
Potassium: 3.5 mmol/L (ref 3.5–5.1)
Sodium: 143 mmol/L (ref 135–145)

## 2020-02-19 MED ORDER — LACOSAMIDE 100 MG PO TABS: 100.0000 mg | ORAL_TABLET | Freq: Two times a day (BID) | ORAL | 3 refills | Status: AC

## 2020-02-19 MED ORDER — FUROSEMIDE 20 MG PO TABS: 20.0000 mg | ORAL_TABLET | Freq: Every day | ORAL | 11 refills | Status: AC | PRN

## 2020-02-19 MED ORDER — LISINOPRIL 10 MG PO TABS: 10.0000 mg | ORAL_TABLET | Freq: Every day | ORAL | 0 refills | Status: AC

## 2020-02-19 MED ORDER — METOPROLOL SUCCINATE ER 200 MG PO TB24: 200.0000 mg | ORAL_TABLET | Freq: Every day | ORAL | Status: AC

## 2020-02-19 MED ORDER — METOPROLOL SUCCINATE ER 100 MG PO TB24
200.0000 mg | ORAL_TABLET | Freq: Every day | ORAL | Status: DC
  Administered 2020-02-19: 200 mg via ORAL
  Filled 2020-02-19 (×2): qty 2

## 2020-02-19 MED ORDER — LISINOPRIL 10 MG PO TABS
10.0000 mg | ORAL_TABLET | Freq: Every day | ORAL | Status: DC
  Administered 2020-02-19: 10 mg via ORAL
  Filled 2020-02-19: qty 1

## 2020-02-19 MED ORDER — ATORVASTATIN CALCIUM 40 MG PO TABS: 40.0000 mg | ORAL_TABLET | ORAL | Status: AC

## 2020-02-19 MED ORDER — APIXABAN 2.5 MG PO TABS: 2.5000 mg | ORAL_TABLET | Freq: Two times a day (BID) | ORAL | 2 refills | Status: AC

## 2020-02-19 NOTE — TOC Transition Note (Signed)
Transition of Care [facility]) - CM/SW Discharge Note   Patient Details  Name: [name] MRN: [id number] Date of Birth: April 11, 1932  Transition of Care [facility]) CM/SW Contact:  Geralynn Ochs, LCSW Phone Number: 02/19/2020, 11:00 AM   Clinical Narrative:   Nurse to call report to [phone]    Final next level of care: [place] Barriers to Discharge: Barriers Resolved   Patient Goals and CMS Choice Patient states their goals for this hospitalization and ongoing recovery are:: patient unable to participate in goal setting, only oriented to self and place CMS Medicare.gov Compare Post Acute Care list provided to:: Patient Represenative (must comment) Choice offered to / list presented to : Adult Children  Discharge Placement              Patient chooses bed at: [facility] Patient to be transferred to facility by: [place] Name of family member notified: Ray Patient and family notified of of transfer: 02/19/20  Discharge Plan and Services     Post Acute Care Choice: [place]                               Social Determinants of Health (SDOH) Interventions     Readmission Risk Interventions No flowsheet data found.

## 2020-02-19 NOTE — Discharge Summary (Addendum)
Physician Discharge Summary  [name] BHA:[id number] DOB: [birth year] DOA: 02/14/2020  PCP: Dettinger, Fransisca Kaufmann, MD  Admit date: 02/14/2020 Discharge date: 02/19/2020  Admitted From: Home Disposition:  SNF  Recommendations for Outpatient Follow-up:  1. Follow up with PCP in 1-2 weeks 2. Please obtain BMP/CBC in one week 3. Palliative care to meet with patient at facility and son to discuss end-of-life.  Home Health:No Equipment/Devices:None  Discharge Condition:Guarded CODE STATUS: Limited code Diet recommendation: Heart Healthy   Brief/Interim Summary:  87 y.o. female past medical history significant for advanced dementia, chronic A. fib on systemic anticoagulation, hypothyroidism is brought in by EMS for strokelike symptoms.  In the ED she was found in A. fib started on Cardizem drip.  Significant Events: 02/14/2020 admission  Significant studies: 02/14/2020 CT of the head showed no acute findings. 02/14/2020 CTA was negative for large caliber occlusion 02/14/2020 MRI of the brain showed multiple small acute infarcts involving the bilateral frontal parietal left occipital lobe with edema without mass-effect, advanced chronic microvascular ischemic changes bilateral cerebral microhemorrhage 2D echo an EF of 30% with severe akinesia of the left ventricular entire anterior wall, anteroseptal wall and apical segment.  Reduced right ventricular systolic function moderately reduced with a PA pressure of 40 mmHg.  Bilateral atrial enlargement.  Antibiotics: None  Microbiology data: Blood culture:   Discharge Diagnoses:  Active Problems:   Atrial fibrillation with rapid ventricular response (HCC)   Chronic diastolic CHF (congestive heart failure) (HCC)   Essential hypertension   CVA (cerebral vascular accident) ([place])  Acute CVA: MRI of the brain was done that showed multiple acute small infarcts involving bilateral frontal and parietal occipital lobe with cerebral  microhemorrhage in the cerebellar. Neurology was consulted recommended change her anticoagulation to Eliquis which she tolerated well. A 2D echo was done that showed wall motion abnormalities and a reduced EF of 30%.  A. fib with RVR: She was started on diltiazem IV drip when the 2D echo showed an EF of 30% she was just on metoprolol and it was titrated up she is currently going to skilled nursing facility metoprolol 200 mg p.o. twice daily and Eliquis.  New wall motion abnormality: 2D echo showed new wall motion abnormality with a change in her EF to 30% cardiology was consulted I discussed with the family and the family did not want to pursue ischemic work-up. She was treated conservatively started on statin continue on Eliquis and metoprolol. Palliative care to meet with his family at facility.  Chronic systolic and diastolic heart failure: She appears euvolemic on physical exam she was seen on Lasix as needed, continue metoprolol and lisinopril.  Essential hypertension: Well-controlled, her diltiazem was discontinued. She was started on lisinopril, metoprolol and Lasix as needed.  Seizure disorder: Her Dilantin was discontinued she was started on Vimpat as per recommendation of neurology.   Discharge Instructions  Discharge Instructions    Diet - low sodium heart healthy   Complete by: As directed    Increase activity slowly   Complete by: As directed      Allergies as of 02/19/2020      Reactions   Penicillins Rash, Other (See Comments)   Has patient had a PCN reaction causing immediate rash, facial/tongue/throat swelling, SOB or lightheadedness with hypotension: No Has patient had a PCN reaction causing severe rash involving mucus membranes or skin necrosis: No Has patient had a PCN reaction that required hospitalization No Has patient had a PCN reaction occurring within the last 10  years: No If all of the above answers are "NO", then may proceed with Cephalosporin use.       Medication List    STOP taking these medications   diltiazem 240 MG 24 hr capsule Commonly known as: CARDIZEM CD   diltiazem 240 MG 24 hr tablet Commonly known as: CARDIZEM LA   hydrALAZINE 10 MG tablet Commonly known as: APRESOLINE   multivitamin with minerals Tabs tablet   NON FORMULARY   phenytoin 100 MG ER capsule Commonly known as: DILANTIN   potassium chloride SA 20 MEQ tablet Commonly known as: KLOR-CON   Savaysa 30 MG Tabs tablet Generic drug: edoxaban     TAKE these medications   acetaminophen 325 MG tablet Commonly known as: TYLENOL Take 650 mg by mouth See admin instructions. Take 650 mg by mouth two times a day and an additional 650 mg every six hours as needed for mild pain   apixaban 2.5 MG Tabs tablet Commonly known as: ELIQUIS Take 1 tablet (2.5 mg total) by mouth 2 (two) times daily.   atorvastatin 20 MG tablet Commonly known as: LIPITOR Take 1 tablet (20 mg total) by mouth every other day. What changed: Another medication with the same name was added. Make sure you understand how and when to take each.   atorvastatin 40 MG tablet Commonly known as: LIPITOR Take 1 tablet (40 mg total) by mouth every other day. What changed: You were already taking a medication with the same name, and this prescription was added. Make sure you understand how and when to take each.   busPIRone 10 MG tablet Commonly known as: BUSPAR Take 1 tablet (10 mg total) by mouth 2 (two) times daily.   CALCIUM 500 PO Take 500 mg by mouth daily.   clotrimazole-betamethasone cream Commonly known as: LOTRISONE Apply topically 2 times daily to affected areas until rash clears What changed: See the new instructions.   Dermacloud Oint Apply 1 application topically See admin instructions. Apply to buttocks after incontinence and as needed/as directed   Dextran 70-Hypromellose (PF) 0.1-0.3 % Soln Place 2 drops into the left eye daily as needed (for dryness).   digoxin  0.125 MG tablet Commonly known as: LANOXIN Take 0.5 tablets (0.0625 mg total) by mouth daily.   donepezil 5 MG tablet Commonly known as: ARICEPT TAKE ONE TABLET AT BEDTIME FOR MEMORY What changed:   how much to take  how to take this  when to take this  additional instructions   furosemide 20 MG tablet Commonly known as: Lasix Take 1 tablet (20 mg total) by mouth daily as needed. Take 20 mg daily of Lasix if you see any increase of more than 3 pounds in a 24-hour period or 5 pounds in a 7-day period.   Hemorrhoidal 0.25 % suppository Generic drug: phenylephrine Place 1 suppository rectally 2 (two) times daily.   hydrocortisone 25 MG suppository Commonly known as: ANUSOL-HC Place 1 suppository (25 mg total) rectally 2 (two) times daily. What changed:   when to take this  reasons to take this   Lacosamide 100 MG Tabs Take 1 tablet (100 mg total) by mouth 2 (two) times daily.   levothyroxine 75 MCG tablet Commonly known as: SYNTHROID TAKE 1 TABLET ONCE DAILY What changed: when to take this   Lidocaine 4 % Ptch Apply 1 patch topically See admin instructions. Apply 1 patch to the lower back and leave in place for 12 hours- remove afterwards   lisinopril 10 MG tablet Commonly  known as: ZESTRIL Take 1 tablet (10 mg total) by mouth daily.   loperamide 2 MG capsule Commonly known as: IMODIUM TAKE (1) CAPSULE FOUR TIMES DAILY AS NEEDED FOR DIARRHEA OR LOOSE STOOLS What changed: See the new instructions.   melatonin 5 MG Tabs Take 5 mg by mouth at bedtime. What changed: Another medication with the same name was removed. Continue taking this medication, and follow the directions you see here.   metoprolol 200 MG 24 hr tablet Commonly known as: TOPROL-XL Take 1 tablet (200 mg total) by mouth daily. Take with or immediately following a meal. What changed:   medication strength  See the new instructions.  Another medication with the same name was removed. Continue  taking this medication, and follow the directions you see here.   Preparation H 0.25-88.44 % suppository Generic drug: shark liver oil-cocoa butter Place 1 suppository rectally 2 (two) times daily.   sertraline 50 MG tablet Commonly known as: Zoloft Take 1 tablet (50 mg total) by mouth daily.   tamsulosin 0.4 MG Caps capsule Commonly known as: FLOMAX TAKE (1) CAPSULE DAILY What changed: See the new instructions.   traZODone 50 MG tablet Commonly known as: DESYREL TAKE 1/2 TO 1 TABLET AT BEDTIME AS NEEDED FOR SLEEP What changed: See the new instructions.       Contact information for follow-up providers    Cameron Sprang, MD. Go on 03/14/2020.   Specialty: Neurology Contact information: [place] [id number] [phone]            Contact information for after-discharge care    Destination    [facility] .   Service: Skilled Nursing Contact information: [place] [phone]                 Allergies  Allergen Reactions  . Penicillins Rash and Other (See Comments)    Has patient had a PCN reaction causing immediate rash, facial/tongue/throat swelling, SOB or lightheadedness with hypotension: No Has patient had a PCN reaction causing severe rash involving mucus membranes or skin necrosis: No Has patient had a PCN reaction that required hospitalization No Has patient had a PCN reaction occurring within the last 10 years: No If all of the above answers are "NO", then may proceed with Cephalosporin use.    Consultations:  Neurology  Cardiology   Procedures/Studies: CT Code Stroke CTA Head W/WO contrast  Result Date: 02/14/2020 CLINICAL DATA:  87 year old female code stroke presentation. Neurologic exam raise the possibility of left ACA or MCA territory insult. EXAM: CT ANGIOGRAPHY HEAD AND NECK TECHNIQUE: Multidetector CT imaging of the head and  neck was performed using the standard protocol during bolus administration of intravenous contrast. Multiplanar CT image reconstructions and MIPs were obtained to evaluate the vascular anatomy. Carotid stenosis measurements (when applicable) are obtained utilizing NASCET criteria, using the distal internal carotid diameter as the denominator. CONTRAST:  150 mL OMNIPAQUE IOHEXOL 350 MG/ML SOLN COMPARISON:  Plain head CT 1119 hours today. CTA head and neck 03/01/2018. FINDINGS: The initial arterial bolus attempt was non-diagnostic due to streak artifact from left shoulder arthroplasty interfering with the automatic contrast detection. But the repeat imaging attempt is of superior diagnostic quality. CTA NECK Skeleton: Chronically absent dentition. Cervical spine degeneration. Chronic left shoulder arthroplasty. No acute osseous abnormality identified. Upper chest: Small to moderate layering right pleural effusion. Otherwise stable, negative visible upper lungs. No superior mediastinal lymphadenopathy. Other neck:  Stable.  No acute findings. Aortic arch: 3 vessel arch configuration. Chronic tortuosity of the thoracic aorta. Calcified arch atherosclerosis is stable. Right carotid system: Tortuous brachiocephalic artery and right CCA. Similar capacious right carotid bifurcation. Mild calcified plaque at the bifurcation with no stenosis. Tortuous right ICA is patent to the skull base. Left carotid system: Similar generalized left carotid ectasia/tortuosity with minimal plaque and no stenosis. Vertebral arteries: Mild plaque and tortuosity of the proximal right subclavian artery without stenosis. Superior visualization of the right vertebral artery origin today, normal. Minimal right V1 calcified plaque without stenosis. Mild right vertebral tortuosity to the skull base. Mild plaque in the proximal left subclavian artery without stenosis. Normal left vertebral artery origin. Tortuous left vertebral is patent to the skull  base without stenosis. CTA HEAD Posterior circulation: Patent and normal codominant distal vertebral arteries to the basilar. Patent PICA origins. Patent basilar artery without irregularity or stenosis. Normal SCA and PCA origins. Posterior communicating arteries are diminutive or absent. Bilateral PCA branches are within normal limits. Mildly tortuous left P1. Anterior circulation: Both ICA siphons are patent with mild tortuosity but minimal plaque and no stenosis. Patent carotid termini. Normal MCA and ACA origins. Mildly tortuous A1 segments are patent. Diminutive anterior communicating artery is normal. Bilateral ACA branches are stable and within normal limits. Left MCA M1 segment and trifurcation are patent without stenosis. Left MCA branches are stable and within normal limits. Right MCA M1, right MCA bifurcation, and right MCA branches are stable and within normal limits. Venous sinuses: Patent. Anatomic variants: None. Review of the MIP images confirms the above findings Preliminary results discussed by telephone with Dr. Kerney Elbe on 02/14/2020 at 1138 hours. IMPRESSION: 1. Negative for large vessel occlusion. Stable CTA head and neck since 2020, positive for generalized arterial tortuosity, but with minimal atherosclerosis for age and no arterial stenosis. 2. Small to moderate layering right pleural effusion. 3. Aortic Atherosclerosis (ICD10-I70.0). Preliminary results discussed by telephone with Dr. Kerney Elbe on 02/14/2020 at 1138 hours. Electronically Signed   By: Genevie [name] M.D.   On: 02/14/2020 11:53   CT Code Stroke CTA Neck W/WO contrast  Result Date: 02/14/2020 CLINICAL DATA:  87 year old female code stroke presentation. Neurologic exam raise the possibility of left ACA or MCA territory insult. EXAM: CT ANGIOGRAPHY HEAD AND NECK TECHNIQUE: Multidetector CT imaging of the head and neck was performed using the standard protocol during bolus administration of intravenous contrast. Multiplanar CT  image reconstructions and MIPs were obtained to evaluate the vascular anatomy. Carotid stenosis measurements (when applicable) are obtained utilizing NASCET criteria, using the distal internal carotid diameter as the denominator. CONTRAST:  150 mL OMNIPAQUE IOHEXOL 350 MG/ML SOLN COMPARISON:  Plain head CT 1119 hours today. CTA head and neck 03/01/2018. FINDINGS: The initial arterial bolus attempt was non-diagnostic due to streak artifact from left shoulder arthroplasty interfering with the automatic contrast detection. But the repeat imaging attempt is of superior diagnostic quality. CTA NECK Skeleton: Chronically absent dentition. Cervical spine degeneration. Chronic left shoulder arthroplasty. No acute osseous abnormality identified. Upper chest: Small to moderate layering right pleural effusion. Otherwise stable, negative visible upper lungs. No superior mediastinal lymphadenopathy. Other neck: Stable.  No acute findings. Aortic arch: 3 vessel arch configuration. Chronic tortuosity of the thoracic aorta. Calcified arch atherosclerosis is stable. Right carotid system: Tortuous brachiocephalic artery and right CCA. Similar capacious right carotid bifurcation. Mild calcified plaque at the bifurcation with no stenosis. Tortuous right ICA is patent to the skull base. Left  carotid system: Similar generalized left carotid ectasia/tortuosity with minimal plaque and no stenosis. Vertebral arteries: Mild plaque and tortuosity of the proximal right subclavian artery without stenosis. Superior visualization of the right vertebral artery origin today, normal. Minimal right V1 calcified plaque without stenosis. Mild right vertebral tortuosity to the skull base. Mild plaque in the proximal left subclavian artery without stenosis. Normal left vertebral artery origin. Tortuous left vertebral is patent to the skull base without stenosis. CTA HEAD Posterior circulation: Patent and normal codominant distal vertebral arteries to the  basilar. Patent PICA origins. Patent basilar artery without irregularity or stenosis. Normal SCA and PCA origins. Posterior communicating arteries are diminutive or absent. Bilateral PCA branches are within normal limits. Mildly tortuous left P1. Anterior circulation: Both ICA siphons are patent with mild tortuosity but minimal plaque and no stenosis. Patent carotid termini. Normal MCA and ACA origins. Mildly tortuous A1 segments are patent. Diminutive anterior communicating artery is normal. Bilateral ACA branches are stable and within normal limits. Left MCA M1 segment and trifurcation are patent without stenosis. Left MCA branches are stable and within normal limits. Right MCA M1, right MCA bifurcation, and right MCA branches are stable and within normal limits. Venous sinuses: Patent. Anatomic variants: None. Review of the MIP images confirms the above findings Preliminary results discussed by telephone with Dr. Kerney Elbe on 02/14/2020 at 1138 hours. IMPRESSION: 1. Negative for large vessel occlusion. Stable CTA head and neck since 2020, positive for generalized arterial tortuosity, but with minimal atherosclerosis for age and no arterial stenosis. 2. Small to moderate layering right pleural effusion. 3. Aortic Atherosclerosis (ICD10-I70.0). Preliminary results discussed by telephone with Dr. Kerney Elbe on 02/14/2020 at 1138 hours. Electronically Signed   By: Genevie [name] M.D.   On: 02/14/2020 11:53   CT Cervical Spine Wo Contrast  Result Date: 02/14/2020 CLINICAL DATA:  Code stroke.  87 year old female.  Neck trauma. EXAM: CT CERVICAL SPINE WITHOUT CONTRAST TECHNIQUE: Multidetector CT imaging of the cervical spine was performed without intravenous contrast. Multiplanar CT image reconstructions were also generated. COMPARISON:  CT cervical spine 02/18/2009. FINDINGS: Alignment: Improved cervical lordosis from 2011. Mild degenerative appearing anterolisthesis of C3 on C4, associated with chronic right side  facet arthropathy. Similar anterolisthesis of C7 on T1 with bilateral facet hypertrophy. Skull base and vertebrae: Visualized skull base is intact. No atlanto-occipital dissociation. Osteopenia. C1-C2 appear intact, although with mild to moderate joint space loss on the right. Normal C1-C2 alignment for the degree of head rotation. No acute osseous abnormality identified. Soft tissues and spinal canal: No prevertebral fluid or swelling. No visible canal hematoma. Calcified carotid atherosclerosis in the neck. Disc levels: Widespread cervical spine degeneration but only mild cervical spinal stenosis suspected. Upper chest: Negative. Other: Head CT reported separately. IMPRESSION: 1. No acute traumatic injury identified in the cervical spine. 2. Osteopenia.  Progressed cervical spine degeneration since 2011. Electronically Signed   By: Genevie [name] M.D.   On: 02/14/2020 11:29   MR BRAIN WO CONTRAST  Result Date: 02/15/2020 EXAM: MRI HEAD WITHOUT CONTRAST TECHNIQUE: Multiplanar, multiecho pulse sequences of the brain and surrounding structures were obtained without intravenous contrast. COMPARISON:  MRI March 03, 2018. FINDINGS: Brain: Multiple cortical and subcortical acute infarcts in the high left frontoparietal region. Additional punctate infarcts in the high right parietal lobe and left occipital lobe, small infarct in left caudate head, and small cortical infarct in the high right frontal lobe. Mild edema without mass effect. No midline shift. Basal cisterns are patent. No  evidence of acute hemorrhage. Additional advanced scattered T2/FLAIR hyperintensities within the white matter, most likely related to chronic microvascular ischemic disease. Generalized cerebral volume loss with ex vacuo ventricular dilation. Multiple small foci of susceptibility artifact involving bilateral cerebellar hemispheres and right temporal lobe, likely the sequela of prior microhemorrhage. No extra-axial fluid collection. Vascular:  Major arterial flow voids are maintained at the skull base. Better evaluated on recent CTA, Skull and upper cervical spine: Normal marrow signal. Sinuses/Orbits: Sinuses are largely clear.  Unremarkable orbits. Other: Small left and trace right mastoid effusions. IMPRESSION: 1. Multiple small acute infarcts involving bilateral frontoparietal lobes, left occipital lobe, and the left caudate. The high left frontoparietal region is most involved. Mild edema without mass effect. Involvement of multiple vascular territories is concerning for a central embolic etiology. 2. Advanced chronic microvascular ischemic disease. 3. Bilateral cerebellar and right temporal lobe prior microhemorrhages, possibly the sequela of hypertension, amyloid angiopathy, or emboli. These results will be called to the ordering clinician or representative by the Radiologist Assistant, and communication documented in the PACS or [employer]. Electronically Signed   By: Margaretha Sheffield MD   On: 02/15/2020 11:02   DG Chest Portable 1 View  Result Date: 02/14/2020 CLINICAL DATA:  Fall. EXAM: PORTABLE CHEST 1 VIEW COMPARISON:  March 04, 2018. FINDINGS: Stable cardiomegaly. No pneumothorax or pleural effusion is noted. Both lungs are clear. Status post left total shoulder arthroplasty. Old right rib fractures are noted. IMPRESSION: No active disease. Electronically Signed   By: Marijo Conception M.D.   On: 02/14/2020 12:06   EEG adult  Result Date: 02/15/2020 Lora Havens, MD     02/15/2020  3:30 PM Patient Name: [name] MRN: [id number] Epilepsy Attending: Lora Havens Referring Physician/Provider: Dr Rosalin Hawking Date: 02/15/2020 Duration: 25.37 mins Patient history: 87yo F with h/o seizure presented after fall and ams. EEG to evaluate for seizure Level of alertness: Awake, asleep AEDs during EEG study: PHT Technical aspects: This EEG study was done with scalp electrodes positioned according to the 10-20 International system  of electrode placement. Electrical activity was acquired at a sampling rate of 500Hz  and reviewed with a high frequency filter of 70Hz  and a low frequency filter of 1Hz . EEG data were recorded continuously and digitally stored. Description: No clear posterior dominant rhythm was seen. Sleep was characterized by sleep spindles (12 to 14 Hz), maximal frontocentral region. EEG showed continuous generalized and maximal left frontal region 3 to 6 Hz theta-delta slowing. Hyperventilation and photic stimulation were not performed.   ABNORMALITY -Continuous slow, generalized and maximal left frontal region IMPRESSION: This study is suggestive of cortical dysfunction in left frontal region likely due to underlying stroke. There is also moderate diffuse encephalopathy, nonspecific etiology. No seizures or epileptiform discharges were seen throughout the recording. Lora Havens   ECHOCARDIOGRAM COMPLETE  Result Date: 02/15/2020    ECHOCARDIOGRAM REPORT   Patient Name:   [name] Date of Exam: 02/15/2020 Medical Rec #:  [id number]      Height:       62.0 in Accession #:    [id number]     Weight:       110.0 lb Date of Birth:  Oct 16, 1932      BSA:          1.483 m Patient Age:    87 years       BP:           101/74 mmHg Patient Gender: F  HR:           82 bpm. Exam Location:  Inpatient Procedure: 2D Echo and Intracardiac Opacification Agent Indications:    TIA  History:        Patient has prior history of Echocardiogram examinations, most                 recent 01/31/2018. CHF, TIA, Arrythmias:Atrial Fibrillation; Risk                 Factors:Hypertension.  Sonographer:    Johny Chess Referring Phys: [id number] [place]  1. Left ventricular ejection fraction, by estimation, is 30 to 35%. The left ventricle has moderately decreased function. The left ventricle demonstrates regional wall motion abnormalities (see scoring diagram/findings for description). There is mild left ventricular  hypertrophy. Left ventricular diastolic function could not be evaluated. There is severe akinesis of the left ventricular, entire anterior wall, anteroseptal wall, apical segment and inferoapical segment. Findings suggest extensive LAD territory ischemia/infarct or less likely stress-induced cardimyopathy. The based of the heart is hyperdynamic. No mural thrombus with Definity contrast.  2. Right ventricular systolic function is moderately reduced. The right ventricular size is normal. There is mildly elevated pulmonary artery systolic pressure. The estimated right ventricular systolic pressure is 56.3 mmHg.  3. Left atrial size was moderately dilated.  4. The mitral valve is abnormal. Mild mitral valve regurgitation.  5. The aortic valve is tricuspid. Aortic valve regurgitation is not visualized. Mild aortic valve sclerosis is present, with no evidence of aortic valve stenosis.  6. The inferior vena cava is normal in size with <50% respiratory variability, suggesting right atrial pressure of 8 mmHg. Comparison(s): Changes from prior study are noted. 01/31/2018: LVEF 60-65%. FINDINGS  Left Ventricle: Left ventricular ejection fraction, by estimation, is 30 to 35%. The left ventricle has moderately decreased function. The left ventricle demonstrates regional wall motion abnormalities. Severe akinesis of the left ventricular, entire anterior wall, anteroseptal wall, apical segment and inferoapical segment. Definity contrast agent was given IV to delineate the left ventricular endocardial borders. The left ventricular internal cavity size was normal in size. There is mild left ventricular hypertrophy. Left ventricular diastolic function could not be evaluated due to atrial fibrillation. Left ventricular diastolic function could not be evaluated. Right Ventricle: The right ventricular size is normal. No increase in right ventricular wall thickness. Right ventricular systolic function is moderately reduced. There is  mildly elevated pulmonary artery systolic pressure. The tricuspid regurgitant velocity is 2.85 m/s, and with an assumed right atrial pressure of 8 mmHg, the estimated right ventricular systolic pressure is 14.9 mmHg. Left Atrium: Left atrial size was moderately dilated. Right Atrium: Right atrial size was normal in size. Pericardium: There is no evidence of pericardial effusion. Mitral Valve: The mitral valve is abnormal. There is moderate thickening of the mitral valve leaflet(s). Mild mitral valve regurgitation. Tricuspid Valve: The tricuspid valve is grossly normal. Tricuspid valve regurgitation is mild. Aortic Valve: The aortic valve is tricuspid. Aortic valve regurgitation is not visualized. Mild aortic valve sclerosis is present, with no evidence of aortic valve stenosis. Pulmonic Valve: The pulmonic valve was grossly normal. Pulmonic valve regurgitation is trivial. Aorta: The aortic root and ascending aorta are structurally normal, with no evidence of dilitation. Venous: The inferior vena cava is normal in size with less than 50% respiratory variability, suggesting right atrial pressure of 8 mmHg. IAS/Shunts: No atrial level shunt detected by color flow Doppler.  LEFT VENTRICLE PLAX 2D LVIDd:  3.80 cm LVIDs:         3.10 cm LV PW:         1.00 cm LV IVS:        1.10 cm LVOT diam:     1.60 cm LV SV:         16 LV SV Index:   11 LVOT Area:     2.01 cm  RIGHT VENTRICLE            IVC RV S prime:     6.83 cm/s  IVC diam: 1.90 cm TAPSE (M-mode): 1.1 cm LEFT ATRIUM             Index       RIGHT ATRIUM           Index LA diam:        4.30 cm 2.90 cm/m  RA Area:     16.10 cm LA Vol (A2C):   42.9 ml 28.93 ml/m RA Volume:   43.10 ml  29.07 ml/m LA Vol (A4C):   60.2 ml 40.60 ml/m LA Biplane Vol: 54.1 ml 36.49 ml/m  AORTIC VALVE LVOT Vmax:   56.80 cm/s LVOT Vmean:  36.400 cm/s LVOT VTI:    0.082 m  AORTA Ao Root diam: 2.60 cm Ao Asc diam:  3.00 cm TRICUSPID VALVE TR Peak grad:   32.5 mmHg TR Vmax:         285.00 cm/s  SHUNTS Systemic VTI:  0.08 m Systemic Diam: 1.60 cm Lyman Bishop MD Electronically signed by Lyman Bishop MD Signature Date/Time: 02/15/2020/5:33:43 PM    Final    CT HEAD CODE STROKE WO CONTRAST  Result Date: 02/14/2020 CLINICAL DATA:  Code stroke.  87 year old female. EXAM: CT HEAD WITHOUT CONTRAST TECHNIQUE: Contiguous axial images were obtained from the base of the skull through the vertex without intravenous contrast. COMPARISON:  Brain MRI 03/03/2018.  Head CT 11/14/2019. FINDINGS: Brain: Stable cerebral volume since last year. No midline shift, ventriculomegaly, mass effect, evidence of mass lesion, intracranial hemorrhage or evidence of cortically based acute infarction. Patchy and confluent bilateral cerebral white matter hypodensity is stable. No cortical encephalomalacia identified. Vascular: Calcified atherosclerosis at the skull base. No suspicious intracranial vascular hyperdensity. Skull: Stable.  No acute osseous abnormality identified. Sinuses/Orbits: Chronic left mastoid effusion. Other Visualized paranasal sinuses and mastoids are stable and well pneumatized. Other: Stable orbit and scalp soft tissues. ASPECTS [facility] Stroke Program Early CT Score) Total score (0-10 with 10 being normal): 10 IMPRESSION: 1. Stable non contrast CT appearance of the brain. Advanced white matter disease. ASPECTS 10. 2. These results were communicated to Dr. Cheral Marker at 11:24 am on 02/14/2020 by text page via the [facility] messaging system. Electronically Signed   By: Genevie [name] M.D.   On: 02/14/2020 11:25   (Echo, Carotid, EGD, Colonoscopy, ERCP)    Subjective: No complaints feels great.  Discharge Exam: Vitals:   02/19/20 0807 02/19/20 0815  BP: (!) 142/96 (!) 137/96  Pulse: (!) 105 (!) 104  Resp: (!) 24 (!) 26  Temp: 98.7 F (37.1 C) 97.7 F (36.5 C)  SpO2: 95% 96%   Vitals:   02/19/20 0400 02/19/20 0734 02/19/20 0807 02/19/20 0815  BP: 139/75  (!) 142/96 (!) 137/96  Pulse: 92  (!)  105 (!) 104  Resp: (!) 3  (!) 24 (!) 26  Temp: 99.5 F (37.5 C)  98.7 F (37.1 C) 97.7 F (36.5 C)  TempSrc: Oral  Axillary Oral  SpO2: 95% 95% 95%  96%  Weight:        General: Pt is alert, awake, not in acute distress Cardiovascular: RRR, S1/S2 +, no rubs, no gallops Respiratory: CTA bilaterally, no wheezing, no rhonchi Abdominal: Soft, NT, ND, bowel sounds + Extremities: no edema, no cyanosis    The results of significant diagnostics from this hospitalization (including imaging, microbiology, ancillary and laboratory) are listed below for reference.     Microbiology: Recent Results (from the past 240 hour(s))  Resp Panel by RT-PCR (Flu A&B, Covid) Nasopharyngeal Swab     Status: None   Collection Time: 02/14/20 12:14 PM   Specimen: Nasopharyngeal Swab; Nasopharyngeal(NP) swabs in vial transport medium  Result Value Ref Range Status   SARS Coronavirus 2 by RT PCR NEGATIVE NEGATIVE Final    Comment: (NOTE) SARS-CoV-2 target nucleic acids are NOT DETECTED.  The SARS-CoV-2 RNA is generally detectable in upper respiratory specimens during the acute phase of infection. The lowest concentration of SARS-CoV-2 viral copies this assay can detect is 138 copies/mL. A negative result does not preclude SARS-Cov-2 infection and should not be used as the sole basis for treatment or other patient management decisions. A negative result may occur with  improper specimen collection/handling, submission of specimen other than nasopharyngeal swab, presence of viral mutation(s) within the areas targeted by this assay, and inadequate number of viral copies(<138 copies/mL). A negative result must be combined with clinical observations, patient history, and epidemiological information. The expected result is Negative.  Fact Sheet for Patients:  EntrepreneurPulse.com.au  Fact Sheet for Healthcare Providers:  IncredibleEmployment.be  This test is no t yet  approved or cleared by the [place] FDA and  has been authorized for detection and/or diagnosis of SARS-CoV-2 by FDA under an Emergency Use Authorization (EUA). This EUA will remain  in effect (meaning this test can be used) for the duration of the COVID-19 declaration under Section 564(b)(1) of the Act, 21 U.S.C.section 360bbb-3(b)(1), unless the authorization is terminated  or revoked sooner.       Influenza A by PCR NEGATIVE NEGATIVE Final   Influenza B by PCR NEGATIVE NEGATIVE Final    Comment: (NOTE) The Xpert Xpress SARS-CoV-2/FLU/RSV plus assay is intended as an aid in the diagnosis of influenza from Nasopharyngeal swab specimens and should not be used as a sole basis for treatment. Nasal washings and aspirates are unacceptable for Xpert Xpress SARS-CoV-2/FLU/RSV testing.  Fact Sheet for Patients: EntrepreneurPulse.com.au  Fact Sheet for Healthcare Providers: IncredibleEmployment.be  This test is not yet approved or cleared by the [place] FDA and has been authorized for detection and/or diagnosis of SARS-CoV-2 by FDA under an Emergency Use Authorization (EUA). This EUA will remain in effect (meaning this test can be used) for the duration of the COVID-19 declaration under Section 564(b)(1) of the Act, 21 U.S.C. section 360bbb-3(b)(1), unless the authorization is terminated or revoked.  Performed at [facility], [place] [address], [place], [place] [address]   SARS CORONAVIRUS 2 (TAT 6-24 HRS) Nasopharyngeal Nasopharyngeal Swab     Status: None   Collection Time: 02/18/20  3:30 PM   Specimen: Nasopharyngeal Swab  Result Value Ref Range Status   SARS Coronavirus 2 NEGATIVE NEGATIVE Final    Comment: (NOTE) SARS-CoV-2 target nucleic acids are NOT DETECTED.  The SARS-CoV-2 RNA is generally detectable in upper and lower respiratory specimens during the acute phase of infection. Negative results do not preclude SARS-CoV-2  infection, do not rule out co-infections with other pathogens, and should not be used  as the sole basis for treatment or other patient management decisions. Negative results must be combined with clinical observations, patient history, and epidemiological information. The expected result is Negative.  Fact Sheet for Patients: SugarRoll.be  Fact Sheet for Healthcare Providers: https://www.woods-mathews.com/  This test is not yet approved or cleared by the [place] FDA and  has been authorized for detection and/or diagnosis of SARS-CoV-2 by FDA under an Emergency Use Authorization (EUA). This EUA will remain  in effect (meaning this test can be used) for the duration of the COVID-19 declaration under Se ction 564(b)(1) of the Act, 21 U.S.C. section 360bbb-3(b)(1), unless the authorization is terminated or revoked sooner.  Performed at [facility], [place] [address], [place], [place] [address]      Labs: BNP (last 3 results) Recent Labs    02/18/20 0839  BNP 6,759.1*   Basic Metabolic Panel: Recent Labs  Lab 02/15/20 2144 02/16/20 0602 02/17/20 0355 02/18/20 0839 02/19/20 0223  NA 135 136 137 141 143  K 4.3 4.2 4.3 3.7 3.5  CL 103 104 104 107 107  CO2 20* 20* 23 23 24   GLUCOSE 128* 132* 119* 118* 97  BUN 21 21 29* 31* 23  CREATININE 0.78 0.83 0.78 0.76 0.60  CALCIUM 8.4* 8.5* 8.2* 8.2* 8.2*   Liver Function Tests: Recent Labs  Lab 02/14/20 1100  AST 26  ALT 22  ALKPHOS 117  BILITOT 0.6  PROT 6.1*  ALBUMIN 3.1*   No results for input(s): LIPASE, AMYLASE in the last 168 hours. No results for input(s): AMMONIA in the last 168 hours. CBC: Recent Labs  Lab 02/14/20 1100 02/14/20 1117  WBC 11.3*  --   NEUTROABS 8.7*  --   HGB 14.4 15.0  HCT 45.2 44.0  MCV 97.8  --   PLT 414*  --    Cardiac Enzymes: No results for input(s): CKTOTAL, CKMB, CKMBINDEX, TROPONINI in the last 168 hours. BNP: Invalid  input(s): POCBNP CBG: Recent Labs  Lab 02/14/20 1110  GLUCAP 148*   D-Dimer No results for input(s): DDIMER in the last 72 hours. Hgb A1c No results for input(s): HGBA1C in the last 72 hours. Lipid Profile No results for input(s): CHOL, HDL, LDLCALC, TRIG, CHOLHDL, LDLDIRECT in the last 72 hours. Thyroid function studies No results for input(s): TSH, T4TOTAL, T3FREE, THYROIDAB in the last 72 hours.  Invalid input(s): FREET3 Anemia work up No results for input(s): VITAMINB12, FOLATE, FERRITIN, TIBC, IRON, RETICCTPCT in the last 72 hours. Urinalysis    Component Value Date/Time   COLORURINE YELLOW 11/14/2019 1655   APPEARANCEUR HAZY (A) 11/14/2019 1655   APPEARANCEUR Clear 08/29/2019 1527   LABSPEC 1.010 11/14/2019 1655   PHURINE 5.0 11/14/2019 1655   GLUCOSEU NEGATIVE 11/14/2019 1655   HGBUR SMALL (A) 11/14/2019 1655   BILIRUBINUR NEGATIVE 11/14/2019 1655   BILIRUBINUR Negative 08/29/2019 1527   KETONESUR NEGATIVE 11/14/2019 1655   PROTEINUR NEGATIVE 11/14/2019 1655   UROBILINOGEN 0.2 11/09/2010 1235   NITRITE POSITIVE (A) 11/14/2019 1655   LEUKOCYTESUR NEGATIVE 11/14/2019 1655   Sepsis Labs Invalid input(s): PROCALCITONIN,  WBC,  LACTICIDVEN Microbiology Recent Results (from the past 240 hour(s))  Resp Panel by RT-PCR (Flu A&B, Covid) Nasopharyngeal Swab     Status: None   Collection Time: 02/14/20 12:14 PM   Specimen: Nasopharyngeal Swab; Nasopharyngeal(NP) swabs in vial transport medium  Result Value Ref Range Status   SARS Coronavirus 2 by RT PCR NEGATIVE NEGATIVE Final    Comment: (NOTE) SARS-CoV-2 target nucleic  acids are NOT DETECTED.  The SARS-CoV-2 RNA is generally detectable in upper respiratory specimens during the acute phase of infection. The lowest concentration of SARS-CoV-2 viral copies this assay can detect is 138 copies/mL. A negative result does not preclude SARS-Cov-2 infection and should not be used as the sole basis for treatment or other  patient management decisions. A negative result may occur with  improper specimen collection/handling, submission of specimen other than nasopharyngeal swab, presence of viral mutation(s) within the areas targeted by this assay, and inadequate number of viral copies(<138 copies/mL). A negative result must be combined with clinical observations, patient history, and epidemiological information. The expected result is Negative.  Fact Sheet for Patients:  EntrepreneurPulse.com.au  Fact Sheet for Healthcare Providers:  IncredibleEmployment.be  This test is no t yet approved or cleared by the [place] FDA and  has been authorized for detection and/or diagnosis of SARS-CoV-2 by FDA under an Emergency Use Authorization (EUA). This EUA will remain  in effect (meaning this test can be used) for the duration of the COVID-19 declaration under Section 564(b)(1) of the Act, 21 U.S.C.section 360bbb-3(b)(1), unless the authorization is terminated  or revoked sooner.       Influenza A by PCR NEGATIVE NEGATIVE Final   Influenza B by PCR NEGATIVE NEGATIVE Final    Comment: (NOTE) The Xpert Xpress SARS-CoV-2/FLU/RSV plus assay is intended as an aid in the diagnosis of influenza from Nasopharyngeal swab specimens and should not be used as a sole basis for treatment. Nasal washings and aspirates are unacceptable for Xpert Xpress SARS-CoV-2/FLU/RSV testing.  Fact Sheet for Patients: EntrepreneurPulse.com.au  Fact Sheet for Healthcare Providers: IncredibleEmployment.be  This test is not yet approved or cleared by the [place] FDA and has been authorized for detection and/or diagnosis of SARS-CoV-2 by FDA under an Emergency Use Authorization (EUA). This EUA will remain in effect (meaning this test can be used) for the duration of the COVID-19 declaration under Section 564(b)(1) of the Act, 21 U.S.C. section  360bbb-3(b)(1), unless the authorization is terminated or revoked.  Performed at [facility], [place] [address] [place], [place] [address]   SARS CORONAVIRUS 2 (TAT 6-24 HRS) Nasopharyngeal Nasopharyngeal Swab     Status: None   Collection Time: 02/18/20  3:30 PM   Specimen: Nasopharyngeal Swab  Result Value Ref Range Status   SARS Coronavirus 2 NEGATIVE NEGATIVE Final    Comment: (NOTE) SARS-CoV-2 target nucleic acids are NOT DETECTED.  The SARS-CoV-2 RNA is generally detectable in upper and lower respiratory specimens during the acute phase of infection. Negative results do not preclude SARS-CoV-2 infection, do not rule out co-infections with other pathogens, and should not be used as the sole basis for treatment or other patient management decisions. Negative results must be combined with clinical observations, patient history, and epidemiological information. The expected result is Negative.  Fact Sheet for Patients: SugarRoll.be  Fact Sheet for Healthcare Providers: https://www.woods-mathews.com/  This test is not yet approved or cleared by the [place] FDA and  has been authorized for detection and/or diagnosis of SARS-CoV-2 by FDA under an Emergency Use Authorization (EUA). This EUA will remain  in effect (meaning this test can be used) for the duration of the COVID-19 declaration under Se ction 564(b)(1) of the Act, 21 U.S.C. section 360bbb-3(b)(1), unless the authorization is terminated or revoked sooner.  Performed at [facility], [place] [address] [place], [place] [address]      Time coordinating discharge: Over 30 minutes  SIGNED:   Charlynne Cousins, MD  [facility] 02/19/2020, 9:14 AM Pager   If 7PM-7AM, please contact night-coverage [email or web address] Password [email or web address]

## 2020-02-19 NOTE — Progress Notes (Signed)
Cardiology Progress Note  Patient ID: [name] MRN: [id number] DOB: Jun 03, 1932 Date of Encounter: 02/19/2020  Primary Cardiologist: Minus Breeding, MD  Subjective   Chief Complaint: None.   HPI: HR 90-110 bpm. BP stable. Denies symptoms.   ROS:  All other ROS reviewed and negative. Pertinent positives noted in the HPI.     Inpatient Medications  Scheduled Meds: . apixaban  2.5 mg Oral BID  . atorvastatin  40 mg Oral QODAY  . busPIRone  10 mg Oral BID  . calcium carbonate  1,250 mg Oral Q breakfast  . digoxin  0.0625 mg Oral Daily  . lacosamide  100 mg Oral BID  . levothyroxine  75 mcg Oral Q0600  . lisinopril  10 mg Oral Daily  . metoprolol succinate  200 mg Oral Daily  . sertraline  50 mg Oral Daily  . sodium chloride flush  10-40 mL Intracatheter Q12H  . tamsulosin  0.4 mg Oral Daily  . traZODone  25 mg Oral QHS   Continuous Infusions:  PRN Meds: acetaminophen **OR** acetaminophen (TYLENOL) oral liquid 160 mg/5 mL **OR** acetaminophen, LORazepam, metoprolol tartrate, senna-docusate, sodium chloride flush   Vital Signs   Vitals:   02/18/20 2305 02/19/20 0400 02/19/20 0734 02/19/20 0807  BP: 123/77 139/75  (!) 142/96  Pulse: 92 92    Resp: 18 (!) 3  (!) 24  Temp: 97.9 F (36.6 C) 99.5 F (37.5 C)  98.7 F (37.1 C)  TempSrc: Oral Oral  Axillary  SpO2: 96% 95% 95% 95%  Weight:        Intake/Output Summary (Last 24 hours) at 02/19/2020 0830 Last data filed at 02/19/2020 0600 Gross per 24 hour  Intake 340 ml  Output --  Net 340 ml   Last 3 Weights 02/14/2020 11/19/2019 11/14/2019  Weight (lbs) 110 lb 120 lb 130 lb  Weight (kg) 49.896 kg 54.432 kg 58.968 kg     Telemetry  Overnight telemetry shows Afib 90-110 bpm, which I personally reviewed.   ECG  The most recent ECG shows Afib 140 bpm, anterolateral TWI, which I personally reviewed.   Physical Exam   Vitals:   02/18/20 2305 02/19/20 0400 02/19/20 0734 02/19/20 0807  BP: 123/77 139/75  (!)  142/96  Pulse: 92 92    Resp: 18 (!) 3  (!) 24  Temp: 97.9 F (36.6 C) 99.5 F (37.5 C)  98.7 F (37.1 C)  TempSrc: Oral Oral  Axillary  SpO2: 96% 95% 95% 95%  Weight:         Intake/Output Summary (Last 24 hours) at 02/19/2020 0831 Last data filed at 02/19/2020 0600 Gross per 24 hour  Intake 340 ml  Output --  Net 340 ml    Last 3 Weights 02/14/2020 11/19/2019 11/14/2019  Weight (lbs) 110 lb 120 lb 130 lb  Weight (kg) 49.896 kg 54.432 kg 58.968 kg    Body mass index is 20.12 kg/m.   General: Well nourished, well developed, in no acute distress Head: Atraumatic, normal size  Eyes: PEERLA, EOMI  Neck: Supple, no JVD Endocrine: No thryomegaly Cardiac: Normal S1, S2; irregular rhythm, no m/r/g Lungs: Clear to auscultation bilaterally, no wheezing, rhonchi or rales  Abd: Soft, nontender, no hepatomegaly  Ext: No edema, pulses 2+ Musculoskeletal: No deformities, BUE and BLE strength normal and equal Skin: Warm and dry, no rashes   Neuro: Alert, awake, oriented to person only   Labs  High Sensitivity Troponin:   Recent Labs  Lab 02/16/20 1621 02/16/20  1906 02/16/20 2255  TROPONINIHS 91* 103* 105*     Cardiac EnzymesNo results for input(s): TROPONINI in the last 168 hours. No results for input(s): TROPIPOC in the last 168 hours.  Chemistry Recent Labs  Lab 02/14/20 1100 02/14/20 1117 02/17/20 0355 02/18/20 0839 02/19/20 0223  NA 132*   < > 137 141 143  K 4.4   < > 4.3 3.7 3.5  CL 96*   < > 104 107 107  CO2 22   < > 23 23 24   GLUCOSE 143*   < > 119* 118* 97  BUN 25*   < > 29* 31* 23  CREATININE 1.01*   < > 0.78 0.76 0.60  CALCIUM 8.4*   < > 8.2* 8.2* 8.2*  PROT 6.1*  --   --   --   --   ALBUMIN 3.1*  --   --   --   --   AST 26  --   --   --   --   ALT 22  --   --   --   --   ALKPHOS 117  --   --   --   --   BILITOT 0.6  --   --   --   --   GFRNONAA 55*   < > >60 >60 >60  ANIONGAP 14   < > 10 11 12    < > = values in this interval not displayed.     Hematology Recent Labs  Lab 02/14/20 1100 02/14/20 1117  WBC 11.3*  --   RBC 4.62  --   HGB 14.4 15.0  HCT 45.2 44.0  MCV 97.8  --   MCH 31.2  --   MCHC 31.9  --   RDW 12.3  --   PLT 414*  --    BNP Recent Labs  Lab 02/18/20 0839  BNP 1,842.2*    DDimer No results for input(s): DDIMER in the last 168 hours.   Radiology  No results found.  Cardiac Studies  TTE 02/15/2020 1. Left ventricular ejection fraction, by estimation, is 30 to 35%. The  left ventricle has moderately decreased function. The left ventricle  demonstrates regional wall motion abnormalities (see scoring  diagram/findings for description). There is mild  left ventricular hypertrophy. Left ventricular diastolic function could  not be evaluated. There is severe akinesis of the left ventricular, entire  anterior wall, anteroseptal wall, apical segment and inferoapical segment.  Findings suggest extensive LAD  territory ischemia/infarct or less likely stress-induced cardimyopathy.  The based of the heart is hyperdynamic. No mural thrombus with Definity  contrast.  2. Right ventricular systolic function is moderately reduced. The right  ventricular size is normal. There is mildly elevated pulmonary artery  systolic pressure. The estimated right ventricular systolic pressure is  32.9 mmHg.  3. Left atrial size was moderately dilated.  4. The mitral valve is abnormal. Mild mitral valve regurgitation.  5. The aortic valve is tricuspid. Aortic valve regurgitation is not  visualized. Mild aortic valve sclerosis is present, with no evidence of  aortic valve stenosis.  6. The inferior vena cava is normal in size with <50% respiratory  variability, suggesting right atrial pressure of 8 mmHg.   Patient Profile  [name] is a 87 y.o. female with advanced dementia, permanent atrial fibrillation on edoxaban, hypothyroidism who was admitted on 02/14/2020 for acute stroke and A. fib with RVR.  Assessment  & Plan   1. Acute Stroke -on eliquis.  Continue this.   2. Afib with RVR -metoprolol succinate increased to 200 mg daily -continue home digoxin. Level stable  3. New onset systolic HF, EF 75-05%, Takotsubo  -Echo consistent with stress induced CM -no trop rise to suggest ACS and no symptoms -continue with medical management. Metoprolol succinate 200 mg daily. Lisinopril increased to 10 mg daily.   [facility] will sign off.   Medication Recommendations: Continue metoprolol succinate 200 mg qd, lisinopril 10 mg daily, digoxin 0.0625 mg daily, eliquis 2.5 mg BID, lasix 20 mg daily as needed  Other recommendations (labs, testing, etc):  none Follow up as an outpatient:  We will arrange follow-up with Dr. Percival Spanish   For questions or updates, please contact [place] Please consult [email or web address] for contact info under   Time Spent with Patient: I have spent a total of 15 minutes with patient reviewing hospital notes, telemetry, EKGs, labs and examining the patient as well as establishing an assessment and plan that was discussed with the patient.  > 50% of time was spent in direct patient care.    Signed, Addison Naegeli. Audie Box, [place]  02/19/2020 8:31 AM

## 2020-02-19 NOTE — Progress Notes (Signed)
Pt's peripheral IV and left upper arm midline was removed; sites were clean, dry and intact. Personal belongings were packed by nursing staff and sent with pt. Discharge instructions were placed in envelope for receiving facility. Report was called and given to Colletta Maryland, nurse at [facility]. Pt. transported via stretcher by [facility] to receiving facility.

## 2020-02-20 DIAGNOSIS — E7849 Other hyperlipidemia: Secondary | ICD-10-CM | POA: Diagnosis not present

## 2020-02-20 DIAGNOSIS — E038 Other specified hypothyroidism: Secondary | ICD-10-CM | POA: Diagnosis not present

## 2020-02-20 DIAGNOSIS — I5030 Unspecified diastolic (congestive) heart failure: Secondary | ICD-10-CM | POA: Diagnosis not present

## 2020-02-20 DIAGNOSIS — I4819 Other persistent atrial fibrillation: Secondary | ICD-10-CM | POA: Diagnosis not present

## 2020-02-20 DIAGNOSIS — F32A Depression, unspecified: Secondary | ICD-10-CM | POA: Diagnosis not present

## 2020-02-20 DIAGNOSIS — G9341 Metabolic encephalopathy: Secondary | ICD-10-CM | POA: Diagnosis not present

## 2020-02-20 DIAGNOSIS — M6281 Muscle weakness (generalized): Secondary | ICD-10-CM | POA: Diagnosis not present

## 2020-02-20 DIAGNOSIS — I10 Essential (primary) hypertension: Secondary | ICD-10-CM | POA: Diagnosis not present

## 2020-02-20 DIAGNOSIS — I6389 Other cerebral infarction: Secondary | ICD-10-CM | POA: Diagnosis not present

## 2020-02-20 DIAGNOSIS — E878 Other disorders of electrolyte and fluid balance, not elsewhere classified: Secondary | ICD-10-CM | POA: Diagnosis not present

## 2020-02-21 ENCOUNTER — Other Ambulatory Visit: Payer: Self-pay

## 2020-02-21 ENCOUNTER — Non-Acute Institutional Stay: Payer: Self-pay | Admitting: Nurse Practitioner

## 2020-02-21 DIAGNOSIS — F0280 Dementia in other diseases classified elsewhere without behavioral disturbance: Secondary | ICD-10-CM

## 2020-02-21 DIAGNOSIS — Z515 Encounter for palliative care: Secondary | ICD-10-CM

## 2020-02-21 NOTE — Progress Notes (Signed)
[facility] Consult Note Telephone: [phone]  Fax: [phone]  PATIENT NAME: [name] [address] [place] [address] [phone] (home)  DOB: 1932/03/24 MRN: [id number]  PRIMARY CARE PROVIDER:    Dettinger, Fransisca Kaufmann, MD,  [place] [address] [phone]  REFERRING PROVIDER:   Dettinger, Fransisca Kaufmann, MD [address] [place],  [place] [address] [phone]  RESPONSIBLE PARTY:   Extended Emergency Contact Information Primary Emergency Contact: [name],[name] Address: [place],  [address] [name] of [place] Phone: [phone] Mobile Phone: [phone] Relation: Son  I met face to face with patient in facility.   ASSESSMENT AND RECOMMENDATIONS:   Advance Care Planning: ACP discussion held with patient's son [name]. Goal of care: Patient's goal of care is comfort, son want to do whatever it takes to make her comfortable. Son verbalized desire to reduce patient's visits to specialist if possible, saying patient has an up coming appointment with Neurology saying he does not want her to go to the appointment if the facility provider would manage her mediation. Possibility will be discussed with facility provider. Discussion on disease trajectory of dementia with son, as it is progressive and terminal and likely to eventually lead to dysphagia, worsening weight loss and immobility. Emotional support provided. Directives: Patient's code status is DNR. Son reiterated desire for patient to not be resuscitated in the event of cardiac or respiratory arrest. Signed DNR and MOST form on file in the facility, copy on [facility] EMR.  Cognitive / Functional decline / Symptom Management:  Patient awake, and alert, confused. She is dependent on staff for all of her ADLs, able to feed self. She is wheel chair dependent for ambulation. Patient had about 5lbs weight loss in the last 3  months, BMI 22.9. Staff report fair appetite. No report of fever or chills. Weight loss likely related to diuresis during hospitalization, patient on daily weights. Monitor for worsening weight loss, may consider starting nutritional supplement if indicated. Patient on Vimpat for seizure disorder, no report of recent seizure. Dementia is stable without report of behavioral concerns, patient on Aricept 10m at bedtime. Provided general support and encouragement, no other unmet needs identified at this time.   Follow up Palliative Care Visit: Palliative care will continue to follow for complex decision making and symptom management. Return in about 6-8 weeks or prn.  Family /Caregiver/Community Supports: Patient is a long term [facility] at [facility] Her son RJeanell Rogers very involved in her care.  I spent 48 minutes providing this consultation, time includes time spent with patient and on phone with family, chart review, provider coordination, and documentation. More than 50% of the time in this consultation was spent counseling and coordinating communication.   CHIEF COMPLAINT: Initial palliative care visit  History obtained from review of EMR, interview with facility staff, and discussion with family. Records reviewed and summarized bellow.  HISTORY OF PRESENT ILLNESS:  Terri WORMLEYis a 87y.o. year old female with multiple medical problems including advance dementia, chronic Afib on Eliquis, seizure disorder, hypothyroidism, depression, CHF (EF 30%). Patient is s/p hospitalization 02/14/2020 to 02/19/2020 for CVA.  Palliative Care was asked to follow this patient help address advance care planning and goals of care.   CODE STATUS: DNR  PPS: 40%  HOSPICE ELIGIBILITY/DIAGNOSIS: TBD  PHYSICAL EXAM / ROS:   Current and past weights:  125.4lbs down from 130lbs 3 months ago, Ht 2f2", BMI 22.9kg/m2 General:  frail appearing,cooperative, sitting in wheelchair in NAD Cardiovascular: no chest pain  reported, no edema  Pulmonary: no cough, no increased SOB, room air GI: no report of swallowing issues, appetite fair, no report of constipation, incontinent of bowel GU: no report of dysuria, incontinent of urine MSK:  no joint and ROM abnormalities, no-ambulatory Skin: no rashes or wounds reported,skin thin and fragile Neurological: Weakness, confused Psych: non -anxious affect  02/19/2020: Na 143, K 3.5, BUN 23, Cr 0.60, GFR >60  PAST MEDICAL HISTORY:  Past Medical History:  Diagnosis Date  . Anemia   . [place]rial [place]   . Calf pain    September, 2012, at rest  . Carotid bruit    Doppler, December, 2009, no abnormality  . Cataract   . Diverticulosis   . GERD (gastroesophageal reflux disease)   . History of shingles   . HLD (hyperlipidemia)   . HTN (hypertension)   . Hypothyroidism   . Insomnia   . Osteoarthritis   . PONV (postoperative nausea and vomiting)   . Rectal bleeding 2001   diverticulosis and int hemorrhoids on 07/1999 and 02/2010 colonoscopies.  . [place]izures [place]   . [place]   . Warfarin anticoagulation     SOCIAL HX:  Social History   Tobacco Use  . Smoking status: Never Smoker  . Smokeless tobacco: Never Used  Substance Use Topics  . Alcohol use: No   FAMILY HX:  Family History  Problem Relation Age of Onset  . COPD Brother   . Atrial fibrillation Brother   . Diabetes Brother   . Liver cancer Brother        Alcohol-related  . Colon cancer Brother   . Heart attack Father   . Early death Father   . Heart disease Father   . Rheum arthritis Mother   . Arthritis Mother   . Heart disease Mother   . Coronary artery disease Brother   . Heart attack Daughter   . Gallbladder disease Brother     ALLERGIES:  Allergies  Allergen Reactions  . Penicillins Rash and Other (See Comments)    Has patient had a PCN reaction causing immediate rash, facial/tongue/throat swelling, SOB or lightheadedness with hypotension: No Has patient had a PCN  reaction causing severe rash involving mucus membranes or skin necrosis: No Has patient had a PCN reaction that required hospitalization No Has patient had a PCN reaction occurring within the last 10 years: No If all of the above answers are "NO", then may proceed with Cephalosporin use.     PERTINENT MEDICATIONS:  Outpatient Encounter Medications as of 02/21/2020  Medication Sig  . acetaminophen (TYLENOL) 325 MG tablet Take 650 mg by mouth See admin instructions. Take 650 mg by mouth two times a day and an additional 650 mg every six hours as needed for mild pain  . apixaban (ELIQUIS) 2.5 MG TABS tablet Take 1 tablet (2.5 mg total) by mouth 2 (two) times daily.  .Marland Kitchenatorvastatin (LIPITOR) 20 MG tablet Take 1 tablet (20 mg total) by mouth every other day.  .Marland Kitchenatorvastatin (LIPITOR) 40 MG tablet Take 1 tablet (40 mg total) by mouth every other day.  . busPIRone (BUSPAR) 10 MG tablet Take 1 tablet (10 mg total) by mouth 2 (two) times daily.  . Calcium Carbonate (CALCIUM 500 PO) Take 500 mg by mouth daily.  . clotrimazole-betamethasone (LOTRISONE) cream Apply topically 2 times daily  to affected areas until rash clears (Patient taking differently: Apply 1 application topically See admin instructions. Apply to affected areas 2 times a day until rash clears)  . Dextran 70-Hypromellose, PF, 0.1-0.3 % SOLN Place 2 drops into the left eye daily as needed (for dryness).  Marland Kitchen digoxin (LANOXIN) 0.125 MG tablet Take 0.5 tablets (0.0625 mg total) by mouth daily.  Marland Kitchen donepezil (ARICEPT) 5 MG tablet TAKE ONE TABLET AT BEDTIME FOR MEMORY (Patient taking differently: Take 5 mg by mouth at bedtime.)  . furosemide (LASIX) 20 MG tablet Take 1 tablet (20 mg total) by mouth daily as needed. Take 20 mg daily of Lasix if you see any increase of more than 3 pounds in a 24-hour period or 5 pounds in a 7-day period.  . hydrocortisone (ANUSOL-HC) 25 MG suppository Place 1 suppository (25 mg total) rectally 2 (two) times daily.  (Patient taking differently: Place 25 mg rectally 2 (two) times daily as needed (AS DIRECTED).)  . Infant Care Products [facility]) OINT Apply 1 application topically See admin instructions. Apply to buttocks after incontinence and as needed/as directed  . lacosamide 100 MG TABS Take 1 tablet (100 mg total) by mouth 2 (two) times daily.  Marland Kitchen levothyroxine (SYNTHROID) 75 MCG tablet TAKE 1 TABLET ONCE DAILY (Patient taking differently: Take 75 mcg by mouth in the morning.)  . Lidocaine 4 % PTCH Apply 1 patch topically See admin instructions. Apply 1 patch to the lower back and leave in place for 12 hours- remove afterwards  . lisinopril (ZESTRIL) 10 MG tablet Take 1 tablet (10 mg total) by mouth daily.  Marland Kitchen loperamide (IMODIUM) 2 MG capsule TAKE (1) CAPSULE FOUR TIMES DAILY AS NEEDED FOR DIARRHEA OR LOOSE STOOLS (Patient taking differently: Take 2 mg by mouth 4 (four) times daily as needed for diarrhea or loose stools.)  . melatonin 5 MG TABS Take 5 mg by mouth at bedtime.  . metoprolol succinate (TOPROL-XL) 200 MG 24 hr tablet Take 1 tablet (200 mg total) by mouth daily. Take with or immediately following a meal.  . phenylephrine (HEMORRHOIDAL) 0.25 % suppository Place 1 suppository rectally 2 (two) times daily. (Patient not taking: Reported on 02/14/2020)  . sertraline (ZOLOFT) 50 MG tablet Take 1 tablet (50 mg total) by mouth daily.  . shark liver oil-cocoa butter (PREPARATION H) 0.25-88.44 % suppository Place 1 suppository rectally 2 (two) times daily.  . tamsulosin (FLOMAX) 0.4 MG CAPS capsule TAKE (1) CAPSULE DAILY (Patient taking differently: Take 0.4 mg by mouth daily.)  . traZODone (DESYREL) 50 MG tablet TAKE 1/2 TO 1 TABLET AT BEDTIME AS NEEDED FOR SLEEP (Patient taking differently: Take 25 mg by mouth at bedtime.)   No facility-administered encounter medications on file as of 02/21/2020.    Thank you for the opportunity to participate in the care of Ms. [name]. The palliative care team  will continue to follow. Please call our office at [phone] if we can be of additional assistance.  Jari Favre, DNP, AGPCNP-BC

## 2020-02-22 DIAGNOSIS — E039 Hypothyroidism, unspecified: Secondary | ICD-10-CM | POA: Diagnosis not present

## 2020-02-22 DIAGNOSIS — I4891 Unspecified atrial fibrillation: Secondary | ICD-10-CM | POA: Diagnosis not present

## 2020-02-22 DIAGNOSIS — I69359 Hemiplegia and hemiparesis following cerebral infarction affecting unspecified side: Secondary | ICD-10-CM | POA: Diagnosis not present

## 2020-02-22 DIAGNOSIS — G9340 Encephalopathy, unspecified: Secondary | ICD-10-CM | POA: Diagnosis not present

## 2020-02-22 DIAGNOSIS — I5030 Unspecified diastolic (congestive) heart failure: Secondary | ICD-10-CM | POA: Diagnosis not present

## 2020-02-22 DIAGNOSIS — I10 Essential (primary) hypertension: Secondary | ICD-10-CM | POA: Diagnosis not present

## 2020-02-22 DIAGNOSIS — G309 Alzheimer's disease, unspecified: Secondary | ICD-10-CM | POA: Diagnosis not present

## 2020-02-22 DIAGNOSIS — I639 Cerebral infarction, unspecified: Secondary | ICD-10-CM | POA: Diagnosis not present

## 2020-02-27 DIAGNOSIS — I5030 Unspecified diastolic (congestive) heart failure: Secondary | ICD-10-CM | POA: Diagnosis not present

## 2020-02-27 DIAGNOSIS — I4891 Unspecified atrial fibrillation: Secondary | ICD-10-CM | POA: Diagnosis not present

## 2020-02-27 DIAGNOSIS — R197 Diarrhea, unspecified: Secondary | ICD-10-CM | POA: Diagnosis not present

## 2020-02-27 DIAGNOSIS — M6281 Muscle weakness (generalized): Secondary | ICD-10-CM | POA: Diagnosis not present

## 2020-03-03 ENCOUNTER — Telehealth: Payer: Self-pay | Admitting: Cardiology

## 2020-03-03 NOTE — Telephone Encounter (Signed)
Spoke with patient's son (okay per DPR) who states that patient is currently in a nursing facility and its very painful for her to move so she would not be able to come in the office for an appointment. Patients son would like to know if patient can do a virtual MyChart appointment on 3/15 in the afternoon around 4:15/4:30 if possible so patients son can help her with virtual visit. Advised patients son I would forward message to Dr. Percival Spanish and his nurse to advise. Patients son verbalized understanding.

## 2020-03-03 NOTE — Telephone Encounter (Signed)
Yes with video

## 2020-03-03 NOTE — Telephone Encounter (Signed)
New Messsage:     Son wants to know if pt can do a Virtual Visit. She is in a facility and in is in so much pain, that it is hard to transport her.

## 2020-03-04 NOTE — Telephone Encounter (Signed)
Spoke with patient's son and moved appointment to 3/15 at 4:20p virtual. Son aware appointment may not occur until after scheduled time and will be expecting a call / text from 4:20-5pm.

## 2020-03-07 ENCOUNTER — Ambulatory Visit: Payer: Medicare Other | Admitting: Cardiology

## 2020-03-10 DIAGNOSIS — R278 Other lack of coordination: Secondary | ICD-10-CM | POA: Diagnosis not present

## 2020-03-10 DIAGNOSIS — I110 Hypertensive heart disease with heart failure: Secondary | ICD-10-CM | POA: Diagnosis not present

## 2020-03-10 DIAGNOSIS — M6281 Muscle weakness (generalized): Secondary | ICD-10-CM | POA: Diagnosis not present

## 2020-03-10 DIAGNOSIS — I5030 Unspecified diastolic (congestive) heart failure: Secondary | ICD-10-CM | POA: Diagnosis not present

## 2020-03-10 DIAGNOSIS — R531 Weakness: Secondary | ICD-10-CM | POA: Diagnosis not present

## 2020-03-10 DIAGNOSIS — I4891 Unspecified atrial fibrillation: Secondary | ICD-10-CM | POA: Diagnosis not present

## 2020-03-10 DIAGNOSIS — M810 Age-related osteoporosis without current pathological fracture: Secondary | ICD-10-CM | POA: Diagnosis not present

## 2020-03-10 DIAGNOSIS — I6990 Unspecified sequelae of unspecified cerebrovascular disease: Secondary | ICD-10-CM | POA: Diagnosis not present

## 2020-03-11 DIAGNOSIS — I6990 Unspecified sequelae of unspecified cerebrovascular disease: Secondary | ICD-10-CM | POA: Diagnosis not present

## 2020-03-11 DIAGNOSIS — M6281 Muscle weakness (generalized): Secondary | ICD-10-CM | POA: Diagnosis not present

## 2020-03-11 DIAGNOSIS — R278 Other lack of coordination: Secondary | ICD-10-CM | POA: Diagnosis not present

## 2020-03-11 DIAGNOSIS — I5030 Unspecified diastolic (congestive) heart failure: Secondary | ICD-10-CM | POA: Diagnosis not present

## 2020-03-11 DIAGNOSIS — I4891 Unspecified atrial fibrillation: Secondary | ICD-10-CM | POA: Diagnosis not present

## 2020-03-11 DIAGNOSIS — R531 Weakness: Secondary | ICD-10-CM | POA: Diagnosis not present

## 2020-03-11 DIAGNOSIS — I110 Hypertensive heart disease with heart failure: Secondary | ICD-10-CM | POA: Diagnosis not present

## 2020-03-11 DIAGNOSIS — M810 Age-related osteoporosis without current pathological fracture: Secondary | ICD-10-CM | POA: Diagnosis not present

## 2020-03-12 DIAGNOSIS — I110 Hypertensive heart disease with heart failure: Secondary | ICD-10-CM | POA: Diagnosis not present

## 2020-03-12 DIAGNOSIS — R531 Weakness: Secondary | ICD-10-CM | POA: Diagnosis not present

## 2020-03-12 DIAGNOSIS — I6990 Unspecified sequelae of unspecified cerebrovascular disease: Secondary | ICD-10-CM | POA: Diagnosis not present

## 2020-03-12 DIAGNOSIS — I5030 Unspecified diastolic (congestive) heart failure: Secondary | ICD-10-CM | POA: Diagnosis not present

## 2020-03-12 DIAGNOSIS — I4891 Unspecified atrial fibrillation: Secondary | ICD-10-CM | POA: Diagnosis not present

## 2020-03-12 DIAGNOSIS — R278 Other lack of coordination: Secondary | ICD-10-CM | POA: Diagnosis not present

## 2020-03-12 DIAGNOSIS — M6281 Muscle weakness (generalized): Secondary | ICD-10-CM | POA: Diagnosis not present

## 2020-03-12 DIAGNOSIS — M810 Age-related osteoporosis without current pathological fracture: Secondary | ICD-10-CM | POA: Diagnosis not present

## 2020-03-13 DIAGNOSIS — I5030 Unspecified diastolic (congestive) heart failure: Secondary | ICD-10-CM | POA: Diagnosis not present

## 2020-03-13 DIAGNOSIS — R278 Other lack of coordination: Secondary | ICD-10-CM | POA: Diagnosis not present

## 2020-03-13 DIAGNOSIS — I110 Hypertensive heart disease with heart failure: Secondary | ICD-10-CM | POA: Diagnosis not present

## 2020-03-13 DIAGNOSIS — R531 Weakness: Secondary | ICD-10-CM | POA: Diagnosis not present

## 2020-03-13 DIAGNOSIS — M6281 Muscle weakness (generalized): Secondary | ICD-10-CM | POA: Diagnosis not present

## 2020-03-13 DIAGNOSIS — M810 Age-related osteoporosis without current pathological fracture: Secondary | ICD-10-CM | POA: Diagnosis not present

## 2020-03-13 DIAGNOSIS — I6990 Unspecified sequelae of unspecified cerebrovascular disease: Secondary | ICD-10-CM | POA: Diagnosis not present

## 2020-03-13 DIAGNOSIS — I4891 Unspecified atrial fibrillation: Secondary | ICD-10-CM | POA: Diagnosis not present

## 2020-03-14 ENCOUNTER — Ambulatory Visit: Payer: Medicare Other | Admitting: Neurology

## 2020-03-14 DIAGNOSIS — I69359 Hemiplegia and hemiparesis following cerebral infarction affecting unspecified side: Secondary | ICD-10-CM | POA: Diagnosis not present

## 2020-03-14 DIAGNOSIS — G309 Alzheimer's disease, unspecified: Secondary | ICD-10-CM | POA: Diagnosis not present

## 2020-03-14 DIAGNOSIS — I4891 Unspecified atrial fibrillation: Secondary | ICD-10-CM | POA: Diagnosis not present

## 2020-03-14 DIAGNOSIS — I639 Cerebral infarction, unspecified: Secondary | ICD-10-CM | POA: Diagnosis not present

## 2020-03-14 DIAGNOSIS — R278 Other lack of coordination: Secondary | ICD-10-CM | POA: Diagnosis not present

## 2020-03-14 DIAGNOSIS — I5030 Unspecified diastolic (congestive) heart failure: Secondary | ICD-10-CM | POA: Diagnosis not present

## 2020-03-14 DIAGNOSIS — M810 Age-related osteoporosis without current pathological fracture: Secondary | ICD-10-CM | POA: Diagnosis not present

## 2020-03-14 DIAGNOSIS — R531 Weakness: Secondary | ICD-10-CM | POA: Diagnosis not present

## 2020-03-14 DIAGNOSIS — M6281 Muscle weakness (generalized): Secondary | ICD-10-CM | POA: Diagnosis not present

## 2020-03-14 DIAGNOSIS — E039 Hypothyroidism, unspecified: Secondary | ICD-10-CM | POA: Diagnosis not present

## 2020-03-14 DIAGNOSIS — I6990 Unspecified sequelae of unspecified cerebrovascular disease: Secondary | ICD-10-CM | POA: Diagnosis not present

## 2020-03-14 DIAGNOSIS — G9340 Encephalopathy, unspecified: Secondary | ICD-10-CM | POA: Diagnosis not present

## 2020-03-14 DIAGNOSIS — I110 Hypertensive heart disease with heart failure: Secondary | ICD-10-CM | POA: Diagnosis not present

## 2020-03-14 DIAGNOSIS — I10 Essential (primary) hypertension: Secondary | ICD-10-CM | POA: Diagnosis not present

## 2020-03-17 DIAGNOSIS — I110 Hypertensive heart disease with heart failure: Secondary | ICD-10-CM | POA: Diagnosis not present

## 2020-03-17 DIAGNOSIS — R278 Other lack of coordination: Secondary | ICD-10-CM | POA: Diagnosis not present

## 2020-03-17 DIAGNOSIS — I5030 Unspecified diastolic (congestive) heart failure: Secondary | ICD-10-CM | POA: Diagnosis not present

## 2020-03-17 DIAGNOSIS — M6281 Muscle weakness (generalized): Secondary | ICD-10-CM | POA: Diagnosis not present

## 2020-03-17 DIAGNOSIS — I6990 Unspecified sequelae of unspecified cerebrovascular disease: Secondary | ICD-10-CM | POA: Diagnosis not present

## 2020-03-17 DIAGNOSIS — R531 Weakness: Secondary | ICD-10-CM | POA: Diagnosis not present

## 2020-03-17 DIAGNOSIS — M810 Age-related osteoporosis without current pathological fracture: Secondary | ICD-10-CM | POA: Diagnosis not present

## 2020-03-17 DIAGNOSIS — I4891 Unspecified atrial fibrillation: Secondary | ICD-10-CM | POA: Diagnosis not present

## 2020-03-17 NOTE — Progress Notes (Signed)
Unable to talk to the patient.  Cancelled appt.

## 2020-03-18 ENCOUNTER — Encounter: Payer: Medicare Other | Admitting: Cardiology

## 2020-03-18 ENCOUNTER — Encounter: Payer: Self-pay | Admitting: Cardiology

## 2020-03-18 DIAGNOSIS — I6990 Unspecified sequelae of unspecified cerebrovascular disease: Secondary | ICD-10-CM | POA: Diagnosis not present

## 2020-03-18 DIAGNOSIS — M810 Age-related osteoporosis without current pathological fracture: Secondary | ICD-10-CM | POA: Diagnosis not present

## 2020-03-18 DIAGNOSIS — R278 Other lack of coordination: Secondary | ICD-10-CM | POA: Diagnosis not present

## 2020-03-18 DIAGNOSIS — I110 Hypertensive heart disease with heart failure: Secondary | ICD-10-CM | POA: Diagnosis not present

## 2020-03-18 DIAGNOSIS — M6281 Muscle weakness (generalized): Secondary | ICD-10-CM | POA: Diagnosis not present

## 2020-03-18 DIAGNOSIS — I5030 Unspecified diastolic (congestive) heart failure: Secondary | ICD-10-CM | POA: Diagnosis not present

## 2020-03-18 DIAGNOSIS — R531 Weakness: Secondary | ICD-10-CM | POA: Diagnosis not present

## 2020-03-18 DIAGNOSIS — I4891 Unspecified atrial fibrillation: Secondary | ICD-10-CM | POA: Diagnosis not present

## 2020-03-19 DIAGNOSIS — R278 Other lack of coordination: Secondary | ICD-10-CM | POA: Diagnosis not present

## 2020-03-19 DIAGNOSIS — I6990 Unspecified sequelae of unspecified cerebrovascular disease: Secondary | ICD-10-CM | POA: Diagnosis not present

## 2020-03-19 DIAGNOSIS — I5030 Unspecified diastolic (congestive) heart failure: Secondary | ICD-10-CM | POA: Diagnosis not present

## 2020-03-19 DIAGNOSIS — M6281 Muscle weakness (generalized): Secondary | ICD-10-CM | POA: Diagnosis not present

## 2020-03-19 DIAGNOSIS — R531 Weakness: Secondary | ICD-10-CM | POA: Diagnosis not present

## 2020-03-19 DIAGNOSIS — M810 Age-related osteoporosis without current pathological fracture: Secondary | ICD-10-CM | POA: Diagnosis not present

## 2020-03-19 DIAGNOSIS — I4891 Unspecified atrial fibrillation: Secondary | ICD-10-CM | POA: Diagnosis not present

## 2020-03-19 DIAGNOSIS — I110 Hypertensive heart disease with heart failure: Secondary | ICD-10-CM | POA: Diagnosis not present

## 2020-03-20 DIAGNOSIS — R278 Other lack of coordination: Secondary | ICD-10-CM | POA: Diagnosis not present

## 2020-03-20 DIAGNOSIS — I5030 Unspecified diastolic (congestive) heart failure: Secondary | ICD-10-CM | POA: Diagnosis not present

## 2020-03-20 DIAGNOSIS — I639 Cerebral infarction, unspecified: Secondary | ICD-10-CM | POA: Diagnosis not present

## 2020-03-20 DIAGNOSIS — M6281 Muscle weakness (generalized): Secondary | ICD-10-CM | POA: Diagnosis not present

## 2020-03-20 DIAGNOSIS — M810 Age-related osteoporosis without current pathological fracture: Secondary | ICD-10-CM | POA: Diagnosis not present

## 2020-03-20 DIAGNOSIS — I110 Hypertensive heart disease with heart failure: Secondary | ICD-10-CM | POA: Diagnosis not present

## 2020-03-20 DIAGNOSIS — I6990 Unspecified sequelae of unspecified cerebrovascular disease: Secondary | ICD-10-CM | POA: Diagnosis not present

## 2020-03-20 DIAGNOSIS — R531 Weakness: Secondary | ICD-10-CM | POA: Diagnosis not present

## 2020-03-20 DIAGNOSIS — I4891 Unspecified atrial fibrillation: Secondary | ICD-10-CM | POA: Diagnosis not present

## 2020-03-24 DIAGNOSIS — I4891 Unspecified atrial fibrillation: Secondary | ICD-10-CM | POA: Diagnosis not present

## 2020-03-24 DIAGNOSIS — I110 Hypertensive heart disease with heart failure: Secondary | ICD-10-CM | POA: Diagnosis not present

## 2020-03-24 DIAGNOSIS — M6281 Muscle weakness (generalized): Secondary | ICD-10-CM | POA: Diagnosis not present

## 2020-03-24 DIAGNOSIS — M810 Age-related osteoporosis without current pathological fracture: Secondary | ICD-10-CM | POA: Diagnosis not present

## 2020-03-24 DIAGNOSIS — R531 Weakness: Secondary | ICD-10-CM | POA: Diagnosis not present

## 2020-03-24 DIAGNOSIS — I5030 Unspecified diastolic (congestive) heart failure: Secondary | ICD-10-CM | POA: Diagnosis not present

## 2020-03-24 DIAGNOSIS — R278 Other lack of coordination: Secondary | ICD-10-CM | POA: Diagnosis not present

## 2020-03-24 DIAGNOSIS — I6990 Unspecified sequelae of unspecified cerebrovascular disease: Secondary | ICD-10-CM | POA: Diagnosis not present

## 2020-03-25 DIAGNOSIS — I6990 Unspecified sequelae of unspecified cerebrovascular disease: Secondary | ICD-10-CM | POA: Diagnosis not present

## 2020-03-25 DIAGNOSIS — I110 Hypertensive heart disease with heart failure: Secondary | ICD-10-CM | POA: Diagnosis not present

## 2020-03-25 DIAGNOSIS — I5030 Unspecified diastolic (congestive) heart failure: Secondary | ICD-10-CM | POA: Diagnosis not present

## 2020-03-25 DIAGNOSIS — R278 Other lack of coordination: Secondary | ICD-10-CM | POA: Diagnosis not present

## 2020-03-25 DIAGNOSIS — I4891 Unspecified atrial fibrillation: Secondary | ICD-10-CM | POA: Diagnosis not present

## 2020-03-25 DIAGNOSIS — R531 Weakness: Secondary | ICD-10-CM | POA: Diagnosis not present

## 2020-03-25 DIAGNOSIS — M6281 Muscle weakness (generalized): Secondary | ICD-10-CM | POA: Diagnosis not present

## 2020-03-25 DIAGNOSIS — M810 Age-related osteoporosis without current pathological fracture: Secondary | ICD-10-CM | POA: Diagnosis not present

## 2020-03-26 DIAGNOSIS — M810 Age-related osteoporosis without current pathological fracture: Secondary | ICD-10-CM | POA: Diagnosis not present

## 2020-03-26 DIAGNOSIS — I5030 Unspecified diastolic (congestive) heart failure: Secondary | ICD-10-CM | POA: Diagnosis not present

## 2020-03-26 DIAGNOSIS — I110 Hypertensive heart disease with heart failure: Secondary | ICD-10-CM | POA: Diagnosis not present

## 2020-03-26 DIAGNOSIS — R531 Weakness: Secondary | ICD-10-CM | POA: Diagnosis not present

## 2020-03-26 DIAGNOSIS — M6281 Muscle weakness (generalized): Secondary | ICD-10-CM | POA: Diagnosis not present

## 2020-03-26 DIAGNOSIS — I4891 Unspecified atrial fibrillation: Secondary | ICD-10-CM | POA: Diagnosis not present

## 2020-03-26 DIAGNOSIS — I6990 Unspecified sequelae of unspecified cerebrovascular disease: Secondary | ICD-10-CM | POA: Diagnosis not present

## 2020-03-26 DIAGNOSIS — R278 Other lack of coordination: Secondary | ICD-10-CM | POA: Diagnosis not present

## 2020-03-27 DIAGNOSIS — R531 Weakness: Secondary | ICD-10-CM | POA: Diagnosis not present

## 2020-03-27 DIAGNOSIS — I5030 Unspecified diastolic (congestive) heart failure: Secondary | ICD-10-CM | POA: Diagnosis not present

## 2020-03-27 DIAGNOSIS — I6990 Unspecified sequelae of unspecified cerebrovascular disease: Secondary | ICD-10-CM | POA: Diagnosis not present

## 2020-03-27 DIAGNOSIS — M6281 Muscle weakness (generalized): Secondary | ICD-10-CM | POA: Diagnosis not present

## 2020-03-27 DIAGNOSIS — I110 Hypertensive heart disease with heart failure: Secondary | ICD-10-CM | POA: Diagnosis not present

## 2020-03-27 DIAGNOSIS — I4891 Unspecified atrial fibrillation: Secondary | ICD-10-CM | POA: Diagnosis not present

## 2020-03-27 DIAGNOSIS — M810 Age-related osteoporosis without current pathological fracture: Secondary | ICD-10-CM | POA: Diagnosis not present

## 2020-03-27 DIAGNOSIS — R278 Other lack of coordination: Secondary | ICD-10-CM | POA: Diagnosis not present

## 2020-03-28 DIAGNOSIS — M810 Age-related osteoporosis without current pathological fracture: Secondary | ICD-10-CM | POA: Diagnosis not present

## 2020-03-28 DIAGNOSIS — R278 Other lack of coordination: Secondary | ICD-10-CM | POA: Diagnosis not present

## 2020-03-28 DIAGNOSIS — I5030 Unspecified diastolic (congestive) heart failure: Secondary | ICD-10-CM | POA: Diagnosis not present

## 2020-03-28 DIAGNOSIS — M6281 Muscle weakness (generalized): Secondary | ICD-10-CM | POA: Diagnosis not present

## 2020-03-28 DIAGNOSIS — I4891 Unspecified atrial fibrillation: Secondary | ICD-10-CM | POA: Diagnosis not present

## 2020-03-28 DIAGNOSIS — I6990 Unspecified sequelae of unspecified cerebrovascular disease: Secondary | ICD-10-CM | POA: Diagnosis not present

## 2020-03-28 DIAGNOSIS — I110 Hypertensive heart disease with heart failure: Secondary | ICD-10-CM | POA: Diagnosis not present

## 2020-03-28 DIAGNOSIS — R531 Weakness: Secondary | ICD-10-CM | POA: Diagnosis not present

## 2020-03-29 DIAGNOSIS — R278 Other lack of coordination: Secondary | ICD-10-CM | POA: Diagnosis not present

## 2020-03-29 DIAGNOSIS — M810 Age-related osteoporosis without current pathological fracture: Secondary | ICD-10-CM | POA: Diagnosis not present

## 2020-03-29 DIAGNOSIS — I110 Hypertensive heart disease with heart failure: Secondary | ICD-10-CM | POA: Diagnosis not present

## 2020-03-29 DIAGNOSIS — M6281 Muscle weakness (generalized): Secondary | ICD-10-CM | POA: Diagnosis not present

## 2020-03-29 DIAGNOSIS — I4891 Unspecified atrial fibrillation: Secondary | ICD-10-CM | POA: Diagnosis not present

## 2020-03-29 DIAGNOSIS — I6990 Unspecified sequelae of unspecified cerebrovascular disease: Secondary | ICD-10-CM | POA: Diagnosis not present

## 2020-03-29 DIAGNOSIS — I5030 Unspecified diastolic (congestive) heart failure: Secondary | ICD-10-CM | POA: Diagnosis not present

## 2020-03-29 DIAGNOSIS — R531 Weakness: Secondary | ICD-10-CM | POA: Diagnosis not present

## 2020-04-01 DIAGNOSIS — R531 Weakness: Secondary | ICD-10-CM | POA: Diagnosis not present

## 2020-04-01 DIAGNOSIS — I5030 Unspecified diastolic (congestive) heart failure: Secondary | ICD-10-CM | POA: Diagnosis not present

## 2020-04-01 DIAGNOSIS — M810 Age-related osteoporosis without current pathological fracture: Secondary | ICD-10-CM | POA: Diagnosis not present

## 2020-04-01 DIAGNOSIS — I6990 Unspecified sequelae of unspecified cerebrovascular disease: Secondary | ICD-10-CM | POA: Diagnosis not present

## 2020-04-01 DIAGNOSIS — I110 Hypertensive heart disease with heart failure: Secondary | ICD-10-CM | POA: Diagnosis not present

## 2020-04-01 DIAGNOSIS — I4891 Unspecified atrial fibrillation: Secondary | ICD-10-CM | POA: Diagnosis not present

## 2020-04-01 DIAGNOSIS — R278 Other lack of coordination: Secondary | ICD-10-CM | POA: Diagnosis not present

## 2020-04-01 DIAGNOSIS — M6281 Muscle weakness (generalized): Secondary | ICD-10-CM | POA: Diagnosis not present

## 2020-04-03 DIAGNOSIS — I6990 Unspecified sequelae of unspecified cerebrovascular disease: Secondary | ICD-10-CM | POA: Diagnosis not present

## 2020-04-03 DIAGNOSIS — F32A Depression, unspecified: Secondary | ICD-10-CM | POA: Diagnosis not present

## 2020-04-03 DIAGNOSIS — I110 Hypertensive heart disease with heart failure: Secondary | ICD-10-CM | POA: Diagnosis not present

## 2020-04-03 DIAGNOSIS — R531 Weakness: Secondary | ICD-10-CM | POA: Diagnosis not present

## 2020-04-03 DIAGNOSIS — I4891 Unspecified atrial fibrillation: Secondary | ICD-10-CM | POA: Diagnosis not present

## 2020-04-03 DIAGNOSIS — R278 Other lack of coordination: Secondary | ICD-10-CM | POA: Diagnosis not present

## 2020-04-03 DIAGNOSIS — I5030 Unspecified diastolic (congestive) heart failure: Secondary | ICD-10-CM | POA: Diagnosis not present

## 2020-04-03 DIAGNOSIS — M810 Age-related osteoporosis without current pathological fracture: Secondary | ICD-10-CM | POA: Diagnosis not present

## 2020-04-03 DIAGNOSIS — M6281 Muscle weakness (generalized): Secondary | ICD-10-CM | POA: Diagnosis not present

## 2021-03-04 DEATH — deceased
